# Patient Record
Sex: Female | Born: 1945 | Race: Black or African American | Hispanic: No | Smoking: Former smoker
Health system: Southern US, Community
[De-identification: ages and names within clinical notes are randomized; demographics above are authoritative.]

## PROBLEM LIST (undated history)

## (undated) DIAGNOSIS — Z789 Other specified health status: Secondary | ICD-10-CM

## (undated) DIAGNOSIS — J189 Pneumonia, unspecified organism: Secondary | ICD-10-CM

## (undated) DIAGNOSIS — K922 Gastrointestinal hemorrhage, unspecified: Secondary | ICD-10-CM

## (undated) DIAGNOSIS — E669 Obesity, unspecified: Secondary | ICD-10-CM

## (undated) DIAGNOSIS — J449 Chronic obstructive pulmonary disease, unspecified: Secondary | ICD-10-CM

## (undated) DIAGNOSIS — G4733 Obstructive sleep apnea (adult) (pediatric): Secondary | ICD-10-CM

## (undated) DIAGNOSIS — I4891 Unspecified atrial fibrillation: Secondary | ICD-10-CM

## (undated) DIAGNOSIS — E079 Disorder of thyroid, unspecified: Secondary | ICD-10-CM

## (undated) DIAGNOSIS — I509 Heart failure, unspecified: Secondary | ICD-10-CM

## (undated) DIAGNOSIS — M109 Gout, unspecified: Secondary | ICD-10-CM

## (undated) DIAGNOSIS — N189 Chronic kidney disease, unspecified: Secondary | ICD-10-CM

## (undated) DIAGNOSIS — Z9229 Personal history of other drug therapy: Secondary | ICD-10-CM

## (undated) DIAGNOSIS — R262 Difficulty in walking, not elsewhere classified: Secondary | ICD-10-CM

## (undated) DIAGNOSIS — T8859XA Other complications of anesthesia, initial encounter: Secondary | ICD-10-CM

## (undated) DIAGNOSIS — I272 Pulmonary hypertension, unspecified: Secondary | ICD-10-CM

## (undated) DIAGNOSIS — I429 Cardiomyopathy, unspecified: Secondary | ICD-10-CM

## (undated) DIAGNOSIS — I4819 Other persistent atrial fibrillation: Secondary | ICD-10-CM

## (undated) DIAGNOSIS — H3530 Unspecified macular degeneration: Secondary | ICD-10-CM

## (undated) DIAGNOSIS — I422 Other hypertrophic cardiomyopathy: Secondary | ICD-10-CM

## (undated) DIAGNOSIS — E785 Hyperlipidemia, unspecified: Secondary | ICD-10-CM

## (undated) DIAGNOSIS — M519 Unspecified thoracic, thoracolumbar and lumbosacral intervertebral disc disorder: Secondary | ICD-10-CM

## (undated) DIAGNOSIS — I484 Atypical atrial flutter: Secondary | ICD-10-CM

## (undated) DIAGNOSIS — I5032 Chronic diastolic (congestive) heart failure: Secondary | ICD-10-CM

## (undated) DIAGNOSIS — E039 Hypothyroidism, unspecified: Secondary | ICD-10-CM

## (undated) DIAGNOSIS — D8989 Other specified disorders involving the immune mechanism, not elsewhere classified: Secondary | ICD-10-CM

## (undated) DIAGNOSIS — N183 Chronic kidney disease, stage 3 unspecified: Secondary | ICD-10-CM

## (undated) DIAGNOSIS — I10 Essential (primary) hypertension: Secondary | ICD-10-CM

## (undated) DIAGNOSIS — K219 Gastro-esophageal reflux disease without esophagitis: Secondary | ICD-10-CM

## (undated) DIAGNOSIS — R001 Bradycardia, unspecified: Secondary | ICD-10-CM

## (undated) DIAGNOSIS — R42 Dizziness and giddiness: Secondary | ICD-10-CM

## (undated) DIAGNOSIS — Z531 Procedure and treatment not carried out because of patient's decision for reasons of belief and group pressure: Secondary | ICD-10-CM

## (undated) DIAGNOSIS — IMO0001 Reserved for inherently not codable concepts without codable children: Secondary | ICD-10-CM

## (undated) DIAGNOSIS — I2510 Atherosclerotic heart disease of native coronary artery without angina pectoris: Secondary | ICD-10-CM

## (undated) HISTORY — DX: Other complications of anesthesia, initial encounter: T88.59XA

## (undated) HISTORY — DX: Cardiomyopathy, unspecified: I42.9

## (undated) HISTORY — DX: Heart failure, unspecified: I50.9

## (undated) HISTORY — DX: Obstructive sleep apnea (adult) (pediatric): G47.33

## (undated) HISTORY — DX: Personal history of other drug therapy: Z92.29

## (undated) HISTORY — DX: Pneumonia, unspecified organism: J18.9

## (undated) HISTORY — DX: Obesity, unspecified: E66.9

## (undated) HISTORY — DX: Chronic kidney disease, unspecified: N18.9

## (undated) HISTORY — DX: Disorder of thyroid, unspecified: E07.9

## (undated) HISTORY — DX: Gout, unspecified: M10.9

## (undated) HISTORY — DX: Difficulty in walking, not elsewhere classified: R26.2

## (undated) HISTORY — DX: Gastrointestinal hemorrhage, unspecified: K92.2

## (undated) HISTORY — DX: Unspecified atrial fibrillation: I48.91

## (undated) HISTORY — DX: Pulmonary hypertension, unspecified: I27.20

## (undated) HISTORY — DX: Other specified health status: Z78.9

## (undated) HISTORY — DX: Chronic obstructive pulmonary disease, unspecified: J44.9

## (undated) HISTORY — DX: Hypothyroidism, unspecified: E03.9

## (undated) HISTORY — DX: Unspecified macular degeneration: H35.30

## (undated) HISTORY — PX: CATARACT EXTRACTION: SUR2

## (undated) HISTORY — DX: Essential (primary) hypertension: I10

## (undated) HISTORY — DX: Hyperlipidemia, unspecified: E78.5

## (undated) HISTORY — DX: Gastro-esophageal reflux disease without esophagitis: K21.9

## (undated) HISTORY — DX: Atypical atrial flutter: I48.4

## (undated) HISTORY — DX: Unspecified thoracic, thoracolumbar and lumbosacral intervertebral disc disorder: M51.9

## (undated) HISTORY — DX: Other persistent atrial fibrillation: I48.19

## (undated) HISTORY — PX: CARDIAC CATHETERIZATION: SHX172

---

## 2002-12-07 ENCOUNTER — Other Ambulatory Visit: Admission: RE | Admit: 2002-12-07 | Discharge: 2002-12-07 | Payer: Self-pay | Admitting: Obstetrics and Gynecology

## 2004-05-28 ENCOUNTER — Other Ambulatory Visit: Admission: RE | Admit: 2004-05-28 | Discharge: 2004-05-28 | Payer: Self-pay | Admitting: Obstetrics and Gynecology

## 2008-10-17 ENCOUNTER — Other Ambulatory Visit: Admission: RE | Admit: 2008-10-17 | Discharge: 2008-10-17 | Payer: Self-pay | Admitting: Family Medicine

## 2008-11-02 ENCOUNTER — Encounter: Admission: RE | Admit: 2008-11-02 | Discharge: 2008-11-02 | Payer: Self-pay | Admitting: Family Medicine

## 2009-11-21 ENCOUNTER — Other Ambulatory Visit: Admission: RE | Admit: 2009-11-21 | Discharge: 2009-11-21 | Payer: Self-pay | Admitting: Family Medicine

## 2009-11-23 ENCOUNTER — Encounter: Admission: RE | Admit: 2009-11-23 | Discharge: 2009-11-23 | Payer: Self-pay | Admitting: Family Medicine

## 2009-11-27 ENCOUNTER — Encounter: Admission: RE | Admit: 2009-11-27 | Discharge: 2009-11-27 | Payer: Self-pay | Admitting: Family Medicine

## 2010-04-28 ENCOUNTER — Encounter: Payer: Self-pay | Admitting: Family Medicine

## 2010-11-12 ENCOUNTER — Other Ambulatory Visit: Payer: Self-pay | Admitting: Family Medicine

## 2010-11-12 DIAGNOSIS — N951 Menopausal and female climacteric states: Secondary | ICD-10-CM

## 2010-11-12 DIAGNOSIS — Z1231 Encounter for screening mammogram for malignant neoplasm of breast: Secondary | ICD-10-CM

## 2010-11-26 ENCOUNTER — Ambulatory Visit
Admission: RE | Admit: 2010-11-26 | Discharge: 2010-11-26 | Disposition: A | Discharge status: Home / Self Care | Payer: Medicare Other | Source: Ambulatory Visit | Attending: Family Medicine | Admitting: Family Medicine

## 2010-11-26 DIAGNOSIS — N951 Menopausal and female climacteric states: Secondary | ICD-10-CM

## 2010-11-26 DIAGNOSIS — Z1231 Encounter for screening mammogram for malignant neoplasm of breast: Secondary | ICD-10-CM

## 2010-11-27 ENCOUNTER — Ambulatory Visit: Payer: Self-pay | Admitting: Physical Therapy

## 2010-11-29 ENCOUNTER — Ambulatory Visit: Payer: Medicare Other | Attending: Family Medicine | Admitting: Physical Therapy

## 2010-11-29 DIAGNOSIS — IMO0001 Reserved for inherently not codable concepts without codable children: Secondary | ICD-10-CM | POA: Insufficient documentation

## 2010-11-29 DIAGNOSIS — M2569 Stiffness of other specified joint, not elsewhere classified: Secondary | ICD-10-CM | POA: Insufficient documentation

## 2010-11-29 DIAGNOSIS — M6281 Muscle weakness (generalized): Secondary | ICD-10-CM | POA: Insufficient documentation

## 2010-11-29 DIAGNOSIS — R5381 Other malaise: Secondary | ICD-10-CM | POA: Insufficient documentation

## 2010-12-06 ENCOUNTER — Ambulatory Visit: Payer: Medicare Other | Admitting: Physical Therapy

## 2010-12-11 ENCOUNTER — Ambulatory Visit: Payer: Medicare Other | Attending: Family Medicine

## 2010-12-11 DIAGNOSIS — M2569 Stiffness of other specified joint, not elsewhere classified: Secondary | ICD-10-CM | POA: Insufficient documentation

## 2010-12-11 DIAGNOSIS — M6281 Muscle weakness (generalized): Secondary | ICD-10-CM | POA: Insufficient documentation

## 2010-12-11 DIAGNOSIS — R5381 Other malaise: Secondary | ICD-10-CM | POA: Insufficient documentation

## 2010-12-11 DIAGNOSIS — IMO0001 Reserved for inherently not codable concepts without codable children: Secondary | ICD-10-CM | POA: Insufficient documentation

## 2010-12-16 ENCOUNTER — Ambulatory Visit: Payer: Medicare Other | Admitting: Physical Therapy

## 2010-12-18 ENCOUNTER — Encounter: Payer: Medicare Other | Admitting: Physical Therapy

## 2010-12-19 ENCOUNTER — Other Ambulatory Visit: Payer: Self-pay | Admitting: Gastroenterology

## 2010-12-20 ENCOUNTER — Ambulatory Visit: Payer: Medicare Other | Admitting: Physical Therapy

## 2010-12-23 ENCOUNTER — Ambulatory Visit: Payer: Medicare Other | Admitting: Physical Therapy

## 2010-12-24 ENCOUNTER — Ambulatory Visit (INDEPENDENT_AMBULATORY_CARE_PROVIDER_SITE_OTHER): Payer: Medicare Other | Admitting: Pulmonary Disease

## 2010-12-24 ENCOUNTER — Encounter: Payer: Self-pay | Admitting: Pulmonary Disease

## 2010-12-24 ENCOUNTER — Ambulatory Visit (INDEPENDENT_AMBULATORY_CARE_PROVIDER_SITE_OTHER)
Admission: RE | Admit: 2010-12-24 | Discharge: 2010-12-24 | Disposition: A | Discharge status: Home / Self Care | Payer: Medicare Other | Source: Ambulatory Visit | Attending: Pulmonary Disease | Admitting: Pulmonary Disease

## 2010-12-24 VITALS — BP 106/60 | HR 66 | Temp 97.8°F | Ht 63.0 in | Wt 202.8 lb

## 2010-12-24 DIAGNOSIS — R0600 Dyspnea, unspecified: Secondary | ICD-10-CM | POA: Diagnosis present

## 2010-12-24 DIAGNOSIS — R0609 Other forms of dyspnea: Secondary | ICD-10-CM

## 2010-12-24 DIAGNOSIS — R0989 Other specified symptoms and signs involving the circulatory and respiratory systems: Secondary | ICD-10-CM

## 2010-12-24 NOTE — Progress Notes (Signed)
  Subjective:    Patient ID: Amy [name], female    DOB: 02-15-1946, 65 y.o.   MRN: [id number]  HPI The patient is a 65 year old female who had been asked to see for COPD and dyspnea on exertion.  The patient states that she has had shortness of breath for years, but she has seen a worsening of her exertional tolerance over the last one year.  She was told that she had COPD in 2006 after PFTs were done by her primary physician at the time.  She was started on low-dose Advair, but never used it on a consistent basis.  Most recently she has used it regularly.  She describes a one block dyspnea on exertion at a moderate pace on flat ground, and will get winded bringing groceries in from the car.  She reports shortness of breath with one flight of stairs, as well as light housework.  She denies any cough or mucus production, but does have some lower extremity edema.  She has not had a recent chest x-ray, but tells me that she had a stress test in 2011 that was unremarkable.  She does state that her weight is up 30 pounds over the last one year.   Review of Systems  Constitutional: Negative for fever and unexpected weight change.  HENT: Negative for ear pain, nosebleeds, congestion, sore throat, rhinorrhea, sneezing, trouble swallowing, dental problem, postnasal drip and sinus pressure.   Eyes: Negative for redness and itching.  Respiratory: Positive for shortness of breath. Negative for cough, chest tightness and wheezing.   Cardiovascular: Positive for leg swelling. Negative for palpitations.  Gastrointestinal: Negative for nausea and vomiting.  Genitourinary: Negative for dysuria.  Musculoskeletal: Negative for joint swelling.  Skin: Negative for rash.  Neurological: Negative for headaches.  Hematological: Does not bruise/bleed easily.  Psychiatric/Behavioral: Negative for dysphoric mood. The patient is not nervous/anxious.        Objective:   Physical Exam Constitutional:  Obese  female, no acute distress  HENT:  Nares patent without discharge  Oropharynx without exudate, palate and uvula are elongated  Eyes:  Perrla, eomi, no scleral icterus  Neck:  No JVD, no TMG  Cardiovascular:  Normal rate, regular rhythm, no rubs or gallops.  No murmurs        Intact distal pulses  Pulmonary :  Normal breath sounds, no stridor or respiratory distress   No rales, rhonchi, or wheezing  Abdominal:  Soft, nondistended, bowel sounds present.  No tenderness noted.   Musculoskeletal:  Mild ankle edema noted  Lymph Nodes:  No cervical lymphadenopathy noted  Skin:  No cyanosis noted  Neurologic:  Alert, appropriate, moves all 4 extremities without obvious deficit.         Assessment & Plan:

## 2010-12-24 NOTE — Patient Instructions (Signed)
Will check cxr, and call you with results. Will schedule for breathing tests, and see you back same day to review with you. Continue advair for now at current dose. Work on weight loss and conditioning.

## 2010-12-24 NOTE — Assessment & Plan Note (Signed)
The patient has significant dyspnea on exertion that has been worsening over the last one year.  She apparently has a history of COPD by pulmonary function studies in 2006 by her primary care physician at the time.  However, she is also obese and significantly deconditioned.  She apparently has had a stress test last year that was unremarkable.  At this point, I would like to check a chest x-ray and also schedule her for full PFTs.  Depending upon the results, we may need to intensify her bronchodilator regimen.  I have also encouraged her to work aggressively on weight loss and some type of conditioning program.

## 2010-12-24 NOTE — Progress Notes (Signed)
Addended by: Manson Allan on: 12/24/2010 04:15 PM   Modules accepted: Orders

## 2010-12-25 ENCOUNTER — Ambulatory Visit: Payer: Medicare Other | Admitting: Physical Therapy

## 2010-12-27 ENCOUNTER — Encounter: Payer: Self-pay | Admitting: *Deleted

## 2010-12-30 ENCOUNTER — Ambulatory Visit: Payer: Medicare Other | Admitting: Physical Therapy

## 2011-01-01 ENCOUNTER — Ambulatory Visit: Payer: Medicare Other | Admitting: Physical Therapy

## 2011-01-03 ENCOUNTER — Ambulatory Visit (INDEPENDENT_AMBULATORY_CARE_PROVIDER_SITE_OTHER): Payer: Medicare Other | Admitting: Pulmonary Disease

## 2011-01-03 ENCOUNTER — Encounter: Payer: Self-pay | Admitting: Pulmonary Disease

## 2011-01-03 VITALS — BP 126/78 | HR 78 | Temp 97.8°F | Ht 63.0 in | Wt 202.0 lb

## 2011-01-03 DIAGNOSIS — R0989 Other specified symptoms and signs involving the circulatory and respiratory systems: Secondary | ICD-10-CM

## 2011-01-03 DIAGNOSIS — R0609 Other forms of dyspnea: Secondary | ICD-10-CM

## 2011-01-03 LAB — PULMONARY FUNCTION TEST

## 2011-01-03 NOTE — Assessment & Plan Note (Signed)
The pt's pft's today show minimal airflow obstruction, certainly not near enough to explain her symptoms.  I suspect her obesity and deconditioning are playing major roles, and I am also concerned about diastolic dysfunction with her CM on xray, LE edema, and h/o HTN.  Will recommend to her primary md a cardiac evaluation/echo.  Would keep her on advair for now, but would d/c if she sees improvement in her breathing with weight loss and conditioning.

## 2011-01-03 NOTE — Progress Notes (Signed)
PFT done today. 

## 2011-01-03 NOTE — Progress Notes (Signed)
  Subjective:    Patient ID: Amy [name], female    DOB: [birth year], 65 y.o.   MRN: [id number]  HPI The patient comes in today for followup of her recent PFTs.  She was found to have very little airflow obstruction, no restriction, and a severe reduction in DLCO that corrects to normal with alveolar volume adjustment.  I have reviewed the studies with her in detail, and have answered all of her questions.   Review of Systems  Constitutional: Negative for fever and unexpected weight change.  HENT: Negative for ear pain, nosebleeds, congestion, sore throat, rhinorrhea, sneezing, trouble swallowing, dental problem, postnasal drip and sinus pressure.   Eyes: Negative for redness and itching.  Respiratory: Positive for wheezing. Negative for cough, chest tightness and shortness of breath.   Cardiovascular: Negative for palpitations and leg swelling.  Gastrointestinal: Negative for nausea and vomiting.  Genitourinary: Negative for dysuria.  Musculoskeletal: Negative for joint swelling.  Skin: Negative for rash.  Neurological: Negative for headaches.  Hematological: Bruises/bleeds easily.  Psychiatric/Behavioral: Negative for dysphoric mood. The patient is not nervous/anxious.        Objective:   Physical Exam Obese female in no acute distress Nose without purulence or discharge noted Lower extremities with mild edema, no cyanosis seen Alert and oriented, moves all 4 extremities.       Assessment & Plan:

## 2011-01-03 NOTE — Patient Instructions (Signed)
Your breathing tests show minimal copd today.  That is good news. Continue with advair for now, but work aggressively on weight loss and conditioning.  If you breathing improves, would discontinue the advair Will send an note to Dr. Radene Ou, recommending a cardiac evaluation (echo). followup with me as needed.

## 2011-05-28 DIAGNOSIS — R0602 Shortness of breath: Secondary | ICD-10-CM | POA: Diagnosis not present

## 2011-05-28 DIAGNOSIS — E785 Hyperlipidemia, unspecified: Secondary | ICD-10-CM | POA: Diagnosis not present

## 2011-06-16 DIAGNOSIS — I10 Essential (primary) hypertension: Secondary | ICD-10-CM | POA: Diagnosis not present

## 2011-06-16 DIAGNOSIS — R0602 Shortness of breath: Secondary | ICD-10-CM | POA: Diagnosis not present

## 2011-06-16 DIAGNOSIS — R0609 Other forms of dyspnea: Secondary | ICD-10-CM | POA: Diagnosis not present

## 2011-06-20 DIAGNOSIS — R0602 Shortness of breath: Secondary | ICD-10-CM | POA: Diagnosis not present

## 2011-06-20 DIAGNOSIS — I10 Essential (primary) hypertension: Secondary | ICD-10-CM | POA: Diagnosis not present

## 2011-09-08 DIAGNOSIS — E785 Hyperlipidemia, unspecified: Secondary | ICD-10-CM | POA: Diagnosis not present

## 2011-09-08 DIAGNOSIS — E559 Vitamin D deficiency, unspecified: Secondary | ICD-10-CM | POA: Diagnosis not present

## 2011-09-08 DIAGNOSIS — I10 Essential (primary) hypertension: Secondary | ICD-10-CM | POA: Diagnosis not present

## 2011-09-08 DIAGNOSIS — E039 Hypothyroidism, unspecified: Secondary | ICD-10-CM | POA: Diagnosis not present

## 2011-09-20 DIAGNOSIS — J209 Acute bronchitis, unspecified: Secondary | ICD-10-CM | POA: Diagnosis not present

## 2011-09-29 ENCOUNTER — Encounter (HOSPITAL_COMMUNITY): Payer: Self-pay | Admitting: *Deleted

## 2011-09-29 ENCOUNTER — Emergency Department (HOSPITAL_COMMUNITY)
Admission: EM | Admit: 2011-09-29 | Discharge: 2011-09-29 | Disposition: A | Discharge status: Home / Self Care | Payer: Medicare Other | Attending: Emergency Medicine | Admitting: Emergency Medicine

## 2011-09-29 ENCOUNTER — Emergency Department (HOSPITAL_COMMUNITY): Payer: Medicare Other

## 2011-09-29 DIAGNOSIS — Z79899 Other long term (current) drug therapy: Secondary | ICD-10-CM | POA: Insufficient documentation

## 2011-09-29 DIAGNOSIS — I10 Essential (primary) hypertension: Secondary | ICD-10-CM | POA: Insufficient documentation

## 2011-09-29 DIAGNOSIS — E039 Hypothyroidism, unspecified: Secondary | ICD-10-CM | POA: Diagnosis not present

## 2011-09-29 DIAGNOSIS — J9819 Other pulmonary collapse: Secondary | ICD-10-CM | POA: Diagnosis not present

## 2011-09-29 DIAGNOSIS — R079 Chest pain, unspecified: Secondary | ICD-10-CM | POA: Diagnosis not present

## 2011-09-29 DIAGNOSIS — I4891 Unspecified atrial fibrillation: Secondary | ICD-10-CM | POA: Insufficient documentation

## 2011-09-29 DIAGNOSIS — E669 Obesity, unspecified: Secondary | ICD-10-CM | POA: Diagnosis not present

## 2011-09-29 DIAGNOSIS — Z87891 Personal history of nicotine dependence: Secondary | ICD-10-CM | POA: Insufficient documentation

## 2011-09-29 DIAGNOSIS — E785 Hyperlipidemia, unspecified: Secondary | ICD-10-CM | POA: Insufficient documentation

## 2011-09-29 DIAGNOSIS — I4901 Ventricular fibrillation: Secondary | ICD-10-CM | POA: Diagnosis not present

## 2011-09-29 HISTORY — DX: Dizziness and giddiness: R42

## 2011-09-29 HISTORY — DX: Chronic obstructive pulmonary disease, unspecified: J44.9

## 2011-09-29 LAB — CBC
HCT: 40.9 % (ref 36.0–46.0)
Hemoglobin: 13.6 g/dL (ref 12.0–15.0)
MCH: 29.3 pg (ref 26.0–34.0)
MCHC: 33.3 g/dL (ref 30.0–36.0)
MCV: 88.1 fL (ref 78.0–100.0)
Platelets: 440 10*3/uL — ABNORMAL HIGH (ref 150–400)
RBC: 4.64 MIL/uL (ref 3.87–5.11)
RDW: 16 % — ABNORMAL HIGH (ref 11.5–15.5)
WBC: 11.6 10*3/uL — ABNORMAL HIGH (ref 4.0–10.5)

## 2011-09-29 LAB — POCT I-STAT TROPONIN I

## 2011-09-29 LAB — DIFFERENTIAL
Basophils Relative: 0 % (ref 0–1)
Lymphocytes Relative: 27 % (ref 12–46)
Monocytes Absolute: 0.7 10*3/uL (ref 0.1–1.0)
Monocytes Relative: 6 % (ref 3–12)
Neutro Abs: 7.5 10*3/uL (ref 1.7–7.7)

## 2011-09-29 LAB — TROPONIN I: Troponin I: <0.3 ng/mL (ref <0.30)

## 2011-09-29 LAB — D-DIMER, QUANTITATIVE: D-Dimer, Quant: 0.51 ug/mL-FEU — ABNORMAL HIGH (ref 0.00–0.48)

## 2011-09-29 MED ORDER — ASPIRIN 81 MG PO CHEW
324.0000 mg | CHEWABLE_TABLET | Freq: Once | ORAL | Status: AC
  Administered 2011-09-29: 324 mg via ORAL

## 2011-09-29 MED ORDER — ASPIRIN 81 MG PO CHEW
CHEWABLE_TABLET | ORAL | Status: AC
  Filled 2011-09-29: qty 4

## 2011-09-29 MED ORDER — NITROGLYCERIN 0.4 MG SL SUBL
SUBLINGUAL_TABLET | SUBLINGUAL | Status: AC
  Filled 2011-09-29: qty 25

## 2011-09-29 MED ORDER — METOPROLOL SUCCINATE ER 25 MG PO TB24
25.0000 mg | ORAL_TABLET | Freq: Every day | ORAL | Status: DC
  Administered 2011-09-29: 25 mg via ORAL
  Filled 2011-09-29: qty 1

## 2011-09-29 MED ORDER — SODIUM CHLORIDE 0.9 % IV SOLN
INTRAVENOUS | Status: DC
  Administered 2011-09-29: 20 mL via INTRAVENOUS
  Administered 2011-09-29: 10 mL/h via INTRAVENOUS

## 2011-09-29 MED ORDER — DILTIAZEM HCL 50 MG/10ML IV SOLN
20.0000 mg | Freq: Once | INTRAVENOUS | Status: AC
  Administered 2011-09-29: 20 mg via INTRAVENOUS
  Filled 2011-09-29 (×2): qty 4

## 2011-09-29 NOTE — ED Provider Notes (Signed)
History     CSN: [id number]  Arrival date & time 09/29/11  1521   First MD Initiated Contact with Patient 09/29/11 1533      Chief Complaint  Patient presents with  . Chest Pain    (Consider location/radiation/quality/duration/timing/severity/associated sxs/prior treatment) HPI Develop anterior chest pain, pressure-like onset 20 minutes ago after finishing lunch. Symptoms accompanied a rapid heart beat. No other associated symptoms. No treatment prior to coming here discomfort is nonradiating mild at present. Nothing makes symptoms better or worse. Had similar symptoms approximately 10 years ago Past Medical History  Diagnosis Date  . Hypertension   . Hyperlipidemia   . Hypothyroid    Heart murmur Past Surgical History  Procedure Date  . Cesarean section 1983    Family History  Problem Relation Age of Onset  . Heart disease Sister     Good Pastures Syndrome  . Gout Father   . Lung cancer Father   . Breast cancer Paternal Aunt     History  Substance Use Topics  . Smoking status: Former Smoker -- 0.3 packs/day for 44 years    Types: Cigarettes    Quit date: 04/07/2009  . Smokeless tobacco: Not on file  . Alcohol Use: Not on file    OB History    Grav Para Term Preterm Abortions TAB SAB Ect Mult Living                  Review of Systems  Constitutional: Negative.   HENT: Negative.   Respiratory: Negative.   Cardiovascular: Positive for chest pain and palpitations.  Gastrointestinal: Negative.   Musculoskeletal: Negative.   Skin: Negative.   Neurological: Negative.   Hematological: Negative.   Psychiatric/Behavioral: Negative.     Allergies  Review of patient's allergies indicates no known allergies.  Home Medications   Current Outpatient Rx  Name Route Sig Dispense Refill  . ALBUTEROL SULFATE HFA 108 (90 BASE) MCG/ACT IN AERS Inhalation Inhale 2 puffs into the lungs every 6 (six) hours as needed.      . ATORVASTATIN CALCIUM 40 MG PO TABS Oral  Take 40 mg by mouth daily.      Marland Kitchen FLUTICASONE-SALMETEROL 100-50 MCG/DOSE IN AEPB Inhalation Inhale 1 puff into the lungs 2 (two) times daily.      Marland Kitchen LEVOTHYROXINE SODIUM 88 MCG PO TABS Oral Take 88 mcg by mouth daily.      Marland Kitchen LISINOPRIL-HYDROCHLOROTHIAZIDE 20-12.5 MG PO TABS Oral Take 1 tablet by mouth daily.      Marland Kitchen VITAMIN D (ERGOCALCIFEROL) 50000 UNITS PO CAPS Oral Take 50,000 Units by mouth every 7 (seven) days.       BP 142/76  Pulse 137  Resp 18  SpO2 97%  Physical Exam  Nursing note and vitals reviewed. Constitutional: She appears well-developed and well-nourished.  HENT:  Head: Normocephalic and atraumatic.  Eyes: Conjunctivae are normal. Pupils are equal, round, and reactive to light.  Neck: Neck supple. No tracheal deviation present. No thyromegaly present.  Cardiovascular:  No murmur heard.      Tachycardic irregularly irregular  Pulmonary/Chest: Effort normal and breath sounds normal.  Abdominal: Soft. Bowel sounds are normal. She exhibits no distension. There is no tenderness.       Obese  Musculoskeletal: Normal range of motion. She exhibits no edema and no tenderness.  Neurological: She is alert. Coordination normal.  Skin: Skin is warm and dry. No rash noted.  Psychiatric: She has a normal mood and affect.    ED Course  Procedures (including critical care ti Date: 09/29/2011  Rate: 140  Rhythm: atrial fibrillation  QRS Axis: normal  Intervals: normal  ST/T Wave abnormalities: Inferolateral ischemia, nonspecific ST-T wave changes interpreted by me  Conduction Disutrbances:none  Narrative Interpretation:   Old EKG Reviewed: none available Spoke with Dr. Radford Pax who suggests patient had recent negative stress Myoview. Suggests presets markers Toprol-XL patient remains asymptomatic discharge with office followup tomorrow at 5:15 PM patient is asymptomatic, pain free  Labs Reviewed  CBC  DIFFERENTIAL   No results found.  probability    Results for orders  placed during the hospital encounter of 09/29/11  CBC      Component Value Range   WBC 11.6 (*) 4.0 - 10.5 K/uL   RBC 4.64  3.87 - 5.11 MIL/uL   Hemoglobin 13.6  12.0 - 15.0 g/dL   HCT 40.9  36.0 - 46.0 %   MCV 88.1  78.0 - 100.0 fL   MCH 29.3  26.0 - 34.0 pg   MCHC 33.3  30.0 - 36.0 g/dL   RDW 16.0 (*) 11.5 - 15.5 %   Platelets 440 (*) 150 - 400 K/uL  DIFFERENTIAL      Component Value Range   Neutrophils Relative 65  43 - 77 %   Neutro Abs 7.5  1.7 - 7.7 K/uL   Lymphocytes Relative 27  12 - 46 %   Lymphs Abs 3.1  0.7 - 4.0 K/uL   Monocytes Relative 6  3 - 12 %   Monocytes Absolute 0.7  0.1 - 1.0 K/uL   Eosinophils Relative 2  0 - 5 %   Eosinophils Absolute 0.2  0.0 - 0.7 K/uL   Basophils Relative 0  0 - 1 %   Basophils Absolute 0.0  0.0 - 0.1 K/uL  POCT I-STAT, CHEM 8      Component Value Range   Sodium 137  135 - 145 mEq/L   Potassium 7.6 (*) 3.5 - 5.1 mEq/L   Chloride 107  96 - 112 mEq/L   BUN 21  6 - 23 mg/dL   Creatinine, Ser 1.00  0.50 - 1.10 mg/dL   Glucose, Bld 118 (*) 70 - 99 mg/dL   Calcium, Ion 1.07 (*) 1.12 - 1.32 mmol/L   TCO2 24  0 - 100 mmol/L   Hemoglobin 16.7 (*) 12.0 - 15.0 g/dL   HCT 49.0 (*) 36.0 - 46.0 %  POCT I-STAT TROPONIN I      Component Value Range   Troponin i, poc 0.00  0.00 - 0.08 ng/mL   Comment 3           POTASSIUM      Component Value Range   Potassium 3.9  3.5 - 5.1 mEq/L  POCT I-STAT TROPONIN I      Component Value Range   Troponin i, poc 0.01  0.00 - 0.08 ng/mL   Comment 3           TROPONIN I      Component Value Range   Troponin I <0.30  <0.30 ng/mL  D-DIMER, QUANTITATIVE      Component Value Range   D-Dimer, Quant 0.51 (*) 0.00 - 0.48 ug/mL-FEU  POCT I-STAT TROPONIN I      Component Value Range   Troponin i, poc 0.03  0.00 - 0.08 ng/mL   Comment 3            Dg Chest Port 1 View  09/29/2011  *RADIOLOGY REPORT*  Clinical Data: Chest  pain  PORTABLE CHEST - 1 VIEW  Comparison: 12/24/2010  Findings: Very low lung volumes  resulting in vascular prominence and basilar atelectasis.  No definite CHF, collapse, consolidation, pneumonia, large effusion or pneumothorax.  Monitor leads overlie the chest.  Trachea midline.  IMPRESSION: Low volume exam with vascular prominence and basilar atelectasis  Original Report Authenticated By: Jerilynn Mages. Daryll Brod, M.D.       No diagnosis found.    MDM  extremeley low pretest  For pe  , ddimer requested by dr turner pt without sob therefroe do not feel ct angio warranted after observation for several hours in the ed an pt asymptomatic. Plan aspirin 325 mg daily An appointment has been scheduled for 9 AM tomorrow at Dr. Theodosia Blender office Diagnosis atrial fibrillation with rapid ventricular rate-resolved    Orlie Dakin, MD 09/29/11 2129

## 2011-09-29 NOTE — ED Notes (Addendum)
Took pt. To bathroom on the [place]

## 2011-09-29 NOTE — ED Notes (Signed)
Family reports chest pressure that started 13mn while at restaurant. Pt HR 150 in triage. Pt anxious and tearful. C/o heaviness in chest.

## 2011-09-29 NOTE — Discharge Instructions (Signed)
Atrial Fibrillation Take coated aspirin 325 mg daily starting tomorrow. An appointment has been scheduled for you to see Dr. Radford Pax tomorrow at 9 AM. Return if her condition worsens for any reason Atrial fibrillation is an abnormal heartbeat (rhythm). It can cause your heart rate to be faster or slower than normal, and can cause clots of blood to form in your heart. These clots can cause other health problems. Atrial fibrillation may be caused by a heart attack, lung problem, or certain medicine. Sometimes the cause of atrial fibrillation is not found. HOME CARE  Take blood thinning medicine (anticoagulants) as told by your doctor. Your doctor will need to draw your blood to check lab values if you take blood thinners.   If you had a cardioversion, limit your activity as told by your doctor.   Learn how to check your heartbeat (pulse) for an abnormal or irregular beat. Your doctor can show you how.   Ask your doctor if it is okay to exercise.   Only take medicine as told by your doctor.  GET HELP RIGHT AWAY IF:   You have trouble breathing or feel dizzy.   You have puffy (swollen) feet or ankles.   You have blood in your pee (urine) or poop (bowel movement).   You feel your heart "skipping" beats.   You feel your heart "racing" or beating fast.   You have weakness in your arms or legs.   You have trouble talking, seeing, or thinking.   You have chest pain or pain in your arm or jaw.  MAKE SURE YOU:   Understand these instructions.   Will watch your condition.   Will get help right away if you are not doing well or get worse.  Document Released: 01/01/2008 Document Revised: 03/13/2011 Document Reviewed: 07/12/2009 [facility] Patient Information 2012 [place].

## 2011-09-30 DIAGNOSIS — I4891 Unspecified atrial fibrillation: Secondary | ICD-10-CM | POA: Diagnosis not present

## 2011-09-30 DIAGNOSIS — R079 Chest pain, unspecified: Secondary | ICD-10-CM | POA: Diagnosis not present

## 2011-09-30 DIAGNOSIS — I10 Essential (primary) hypertension: Secondary | ICD-10-CM | POA: Diagnosis not present

## 2011-09-30 LAB — POCT I-STAT, CHEM 8
BUN: 21 mg/dL (ref 6–23)
Calcium, Ion: 1.07 mmol/L — ABNORMAL LOW (ref 1.12–1.32)
Creatinine, Ser: 1 mg/dL (ref 0.50–1.10)
Glucose, Bld: 118 mg/dL — ABNORMAL HIGH (ref 70–99)
Hemoglobin: 16.7 g/dL — ABNORMAL HIGH (ref 12.0–15.0)
TCO2: 24 mmol/L (ref 0–100)

## 2011-10-01 ENCOUNTER — Other Ambulatory Visit: Payer: Self-pay | Admitting: Cardiology

## 2011-10-02 ENCOUNTER — Other Ambulatory Visit: Payer: Self-pay | Admitting: Cardiology

## 2011-10-02 ENCOUNTER — Encounter: Payer: Self-pay | Admitting: Cardiology

## 2011-10-02 NOTE — H&P (Signed)
Office Visit     Patient: Amy [name] Provider: Fransico Him, MD  DOB: 04/15/1945 Age: 66 Y Sex: Female Date: [name]  Phone: [phone]   Address: [address] [place], [place], [place]  Pcp: Amy [name]       Subjective:     CC:    1. TT/PER DR Amy [name].        HPI:  General:  The patient presents today for evaluation of palpitations and chest pain. She recently saw me for SOB and [name] workup including echo and nuclear stress test was normal. Yesterday while eating lunch she suddenly felt palpitations and then chest pressure. She left the restaurant and started getting [name] pain in her right jaw. Her fiance took her to [facility] ER. In ER she was noted to be in afib with RVR. She was given IV Cardizem 3m with decrease in heart rate to 60-80's. She says the week before the was having heaviness in her chest and could not get comfortable while in bed and sat up and took TUMS which resolve the sensation. She has also noticed some burning in her upper chest [name] few days ago that she thought was indigestion. She still has occasional SOB..        ROS:  See HPI, [name] twelve system review was perfomed at today's visit. For pertinent positives and negatives see HPI.       Medical History: Hypertension, Hyperlipidemia, COPD-mild, Hypothyroidism, lumbar disc disease (spine Amy) Amy [name], Macular degenerationRetinal issues sees Optho,Amy [name] last seen 01/2010, chronic c/o DOE with essentially negative w/u and consideration given to GERD/very mild COPD/anxiety and deconditioning, Cards eval 2013 Echocardiogram nl LVF, mild LVH, mild LAE, mild MR/PR, mod MAC, grade II diastolic dysfunction.Stress test negative ischemia., PAF.        Family History:        Social History:  General:  History of smoking  cigarettes: Former smoker Quit in year year Smoking: Just started Chantix 11/06/10.  Alcohol: yes, wine in the evenings;2-3 days per week.  Caffeine: yes, 1 cup of coffee per day.   no Recreational drug use.  no Diet.  Occupation: unemployed, tPharmacist, hospital retired.  Marital Status: single, Divorced. Has new boyfriend 02/2011.  Children: girls, 1.        Medications: Albuterol Sulfate HFA 108 (90 Base) MCG/ACT Aerosol Solution 1 -2 puffs every 4 -6 hrs prn, Advair Diskus 100-50 MCG/DOSE Miscellaneous 1 every 12 hrs using this prn, Vitamin D 1000 UNIT Capsule as directed , Lisinopril-Hydrochlorothiazide 20-12.5 MG Tablet 1 tablet Once [name] day, Aspirin 325 MG Tablet Chewable 1 tablet Once [name] day, Lipitor 40 MG Tablet 1 tablet once [name] day, Krill Oil 1000 MG Capsule as directed pt is currently out, Levothyroxine Sodium 88 MCG Tablet 1 tablet every morning, Medication List reviewed and reconciled with the patient       Allergies: N.K.D.[name].      Objective:     Vitals: Wt 205.2, Wt change 3 lb, Ht 63.50, BMI 35.78, Pulse sitting 64, BP sitting 130/78.       Examination:  Cardiology, General:  GENERAL APPEARANCE: pleasant, NAD.  HEENT: unremarkable.  CAROTID UPSTROKE: normal, no bruit.  JVD: flat.  HEART SOUNDS: regular, normal S1, S2, no S3 or S4.  MURMUR: absent.  LUNGS: no rales or wheezes.  ABDOMEN: soft, non tender, positive bowel sounds, no masses felt.  EXTREMITIES: no leg edema.  PERIPHERAL PULSES: 2 plus bilateral.        Assessment:  Assessment:  1. AF (paroxysmal atrial fibrillation) - 427.31 (Primary)  2. Hypertension, essential - 401.1  3. Chest pain - 786.50    Plan:     1. AF (paroxysmal atrial fibrillation)  Diagnostic Imaging:[facility] ACT Ex (Ordered for 10/07/2011)  I will get [name] lifewatch monitor to assess for silent PAF. Her CHADS score is 1 so I have asked her to continue on ASA daily. Since she is bradycardic today I will not add [name] daily beta blocker.       2. Hypertension, essential  Diagnostic Imaging:EKG sinus bradycardia with LVH and repol abnormality, Corson,Amy [name] 09:24:14 AM > Amy [name] 09:35:03 AM >        3. Chest pain  LAB: Basic Metabolic Normal    GLUCOSE 72 70-99 - mg/dL    BUN 14 6-26 - mg/dL    CREATININE 1.04 0.60-1.30 - mg/dl    eGFR (NON-AFRICAN AMERICAN) 53 >60 - calc L   eGFR (AFRICAN AMERICAN) 64 >60 - calc    SODIUM 139 136-145 - mmol/L    POTASSIUM 4.5 3.5-5.5 - mmol/L    CHLORIDE 105 98-107 - mmol/L    C02 26 22-32 - mg/dL    ANION GAP 11.8 6.0-20.0 - mmol/L    CALCIUM 9.5 8.6-10.3 - mg/dL     Amy [name] 03:12:43 PM > Amy [name] 10/01/2011 01:02:05 PM > Pt. is aware    LAB: CBC with Diff Normal    WBC 11.1 4.0-11.0 - K/ul H   RBC 4.29 4.20-5.40 - [name]/uL    HGB 12.2 12.0-16.0 - g/dL    HCT 39.5 37.0-47.0 - %    MCH 28.5 27.0-33.0 - pg    MPV 7.8 7.5-10.7 - fL    MCV 92.1 81.0-99.0 - fL    MCHC 31.0 32.0-36.0 - g/dL L   RDW 15.2 11.5-15.5 - %    NRBC# 0.02 -    PLT 344 150-400 - K/uL    NEUT % 67.7 43.3-71.9 - %    NRBC% 0.20 - %    LYMPH% 23.3 16.8-43.5 - %    MONO % 6.8 4.6-12.4 - %    EOS % 1.6 0.0-7.8 - %    BASO % 0.6 0.0-1.0 - %    NEUT # 7.5 1.9-7.2 - K/uL H   LYMPH# 2.60 1.10-2.70 - K/uL    MONO # 0.8 0.3-0.8 - K/uL    EOS # 0.2 0.0-0.6 - K/uL    BASO # 0.1 0.0-0.1 - K/uL     Amy [name] 03:12:34 PM > Amy [name] 10/01/2011 01:02:57 PM > Pt. is aware    LAB: PT (Prothrombin Time) (130865) Normal    Prothrombin Time 10.4 9.1-12.0 - SEC    INR 1.0 0.8-1.2 -     Amy [name] 10/01/2011 11:06:25 AM > Called PT. and LVM to call back. Amy [name] 10/01/2011 11:09:37 AM > Called Pts. Cell and she is aware.    Diagnostic Imaging:Cardiac Cath (Ordered for 10/07/2011)  Since she continues to have episodes of chest pain and SOB I have recommended proceeding with cardiac cath to evaluate for underlying CAD.        Immunizations:        Labs:        Procedure Codes: 93000 EKG I AND R, 80048 ECL BMP, 85025 ECL CBC PLATELET DIFF, 78469 BLOOD COLLECTION ROUTINE VENIPUNCTURE       Preventive:         Provider: Fransico Him, MD  Patient: Amy [name], Amy [name]  [name] DOB: December 22, 1945 Date: [name]

## 2011-10-07 ENCOUNTER — Encounter (HOSPITAL_BASED_OUTPATIENT_CLINIC_OR_DEPARTMENT_OTHER)
Admission: RE | Disposition: A | Discharge status: Home / Self Care | Payer: Self-pay | Source: Ambulatory Visit | Attending: Cardiology

## 2011-10-07 ENCOUNTER — Inpatient Hospital Stay (HOSPITAL_BASED_OUTPATIENT_CLINIC_OR_DEPARTMENT_OTHER)
Admission: RE | Admit: 2011-10-07 | Discharge: 2011-10-07 | Disposition: A | Discharge status: Home / Self Care | Payer: Medicare Other | Source: Ambulatory Visit | Attending: Cardiology | Admitting: Cardiology

## 2011-10-07 DIAGNOSIS — R079 Chest pain, unspecified: Secondary | ICD-10-CM | POA: Diagnosis not present

## 2011-10-07 DIAGNOSIS — I2510 Atherosclerotic heart disease of native coronary artery without angina pectoris: Secondary | ICD-10-CM | POA: Diagnosis not present

## 2011-10-07 SURGERY — JV LEFT HEART CATHETERIZATION WITH CORONARY ANGIOGRAM
Anesthesia: Moderate Sedation

## 2011-10-07 MED ORDER — SODIUM CHLORIDE 0.9 % IJ SOLN: 3.0000 mL | Freq: Two times a day (BID) | INTRAMUSCULAR | Status: DC

## 2011-10-07 MED ORDER — ONDANSETRON HCL 4 MG/2ML IJ SOLN: 4.0000 mg | Freq: Four times a day (QID) | INTRAMUSCULAR | Status: DC | PRN

## 2011-10-07 MED ORDER — SODIUM CHLORIDE 0.9 % IV SOLN: 250.0000 mL | INTRAVENOUS | Status: DC | PRN

## 2011-10-07 MED ORDER — FUROSEMIDE 20 MG PO TABS: 20.0000 mg | ORAL_TABLET | Freq: Every day | ORAL | Status: DC

## 2011-10-07 MED ORDER — ASPIRIN 81 MG PO CHEW
324.0000 mg | CHEWABLE_TABLET | ORAL | Status: AC
  Administered 2011-10-07: 324 mg via ORAL

## 2011-10-07 MED ORDER — SODIUM CHLORIDE 0.9 % IV SOLN
INTRAVENOUS | Status: DC
  Administered 2011-10-07: 09:00:00 via INTRAVENOUS

## 2011-10-07 MED ORDER — DIAZEPAM 5 MG PO TABS
5.0000 mg | ORAL_TABLET | ORAL | Status: AC
  Administered 2011-10-07: 5 mg via ORAL

## 2011-10-07 MED ORDER — NITROGLYCERIN 0.4 MG SL SUBL: 0.4000 mg | SUBLINGUAL_TABLET | SUBLINGUAL | Status: DC | PRN

## 2011-10-07 MED ORDER — ACETAMINOPHEN 325 MG PO TABS: 650.0000 mg | ORAL_TABLET | ORAL | Status: DC | PRN

## 2011-10-07 MED ORDER — METOPROLOL SUCCINATE ER 25 MG PO TB24: 25.0000 mg | ORAL_TABLET | Freq: Every day | ORAL | Status: DC

## 2011-10-07 MED ORDER — LISINOPRIL 20 MG PO TABS: 20.0000 mg | ORAL_TABLET | Freq: Every day | ORAL | Status: DC

## 2011-10-07 MED ORDER — SODIUM CHLORIDE 0.9 % IJ SOLN: 3.0000 mL | INTRAMUSCULAR | Status: DC | PRN

## 2011-10-07 NOTE — Progress Notes (Signed)
Bedrest begins @ 1030, tegaderm dressing applied by Moishe Spice, activity restrictions completed with patient.

## 2011-10-07 NOTE — CV Procedure (Signed)
PROCEDURE:  Left heart catheterization with selective coronary angiography, left ventriculogram.  INDICATIONS:    The risks, benefits, and details of the procedure were explained to the patient.  The patient verbalized understanding and wanted to proceed.  Informed written consent was obtained.  PROCEDURE TECHNIQUE:  After Xylocaine anesthesia a 34F sheath was placed in the right femoral artery with a single anterior needle wall stick.   Left coronary angiography was done using a Judkins L4 guide catheter.  Right coronary angiography was done using a Judkins R4 guide catheter.  Left ventriculography was done using a pigtail catheter.    CONTRAST:  Total of 60 cc.  COMPLICATIONS:  None.    HEMODYNAMICS:  Aortic pressure was 118/17mHg; LV pressure was 121/224mg; LVEDP 219m.  There was no gradient between the left ventricle and aorta.    ANGIOGRAPHIC DATA:   The left main coronary artery is widely patent.  The left anterior descending artery is widely patent with a 20% ostial narrowing.  It gives rise to a first diagonal which is small in size with an 80% stenosis and then an aneurysmal segment.  It gives rise then to a second diagonal which is small but patent.  The ongoing LAD traverses the apex and is widely patent.    The left circumflex artery is dominant and  widely patent.  It gives rise to a large OM1 which is widely patent and bifurcates into 2 daughter branches which are widely patent. The inferior branch bifucrates again into 2 vessels which are patent. The ongoing left circumflex is widely patent and gives rise distally to an OM2 and PDA both of which are widely patent.    The right coronary artery is nondominant and patent.  LEFT VENTRICULOGRAM:  Left ventricular angiogram was done in the 30 RAO projection and revealed normal left ventricular wall motion and systolic function with an estimated ejection fraction of 60% with a spade shape.  LVEDP was 21  mmHg.  IMPRESSIONS:  1. Normal left main coronary artery. 2. Normal left anterior descending artery with an 80% stenosis in the proximal portion of a small D1 too small for PCI 3. Normal left circumflex artery and its branches - left dominant 4. Normal right coronary artery. 5. Normal left ventricular systolic function.  LVEDP 21 mmHg.  Ejection fraction 60%. 6.  Elevated LVEDP c/w diastolic dysfunction  RECOMMENDATION:   1.  D/C home once IVF and bedrest complete. 2.  Medical management of diagonal stenosis. 3.  Add Toprol XL 64m72mily for diastolic dysfunction 4.  Discontinue HCT portion of Lisinopril and change to Lasix 20mg82mly 5.  Followup in office in 1 week 6.  Continue ASA and statin

## 2011-10-07 NOTE — H&P (View-Only) (Signed)
Office Visit     Patient: Amy Amy [name] Provider: Fransico Him, MD  DOB: [birth year] Age: 66 Y Sex: Female Date: 09/30/2011  Phone: [phone]   Address: [address] [place], [place], [place]  Pcp: Aretta Nip       Subjective:     CC:    1. TT/PER DR Radford Pax.        HPI:  General:  The patient presents today for evaluation of palpitations and chest pain. She recently saw me for SOB and Amy [name] workup including echo and nuclear stress test was normal. Yesterday while eating lunch she suddenly felt palpitations and then chest pressure. She left the restaurant and started getting Amy [name] pain in her right jaw. Her fiance took her to [facility] ER. In ER she was noted to be in afib with RVR. She was given IV Cardizem 25m with decrease in heart rate to 60-80's. She says the week before the was having heaviness in her chest and could not get comfortable while in bed and sat up and took TUMS which resolve the sensation. She has also noticed some burning in her upper chest Amy [name] few days ago that she thought was indigestion. She still has occasional SOB..        ROS:  See HPI, Amy [name] twelve system review was perfomed at today's visit. For pertinent positives and negatives see HPI.       Medical History: Hypertension, Hyperlipidemia, COPD-mild, Hypothyroidism, lumbar disc disease (spine center) Dr.Tooke, Macular degenerationRetinal issues sees Optho,Dr.Peter Dunn last seen 01/2010, chronic c/o DOE with essentially negative w/u and consideration given to GERD/very mild COPD/anxiety and deconditioning, Cards eval 2013 Echocardiogram nl LVF, mild LVH, mild LAE, mild MR/PR, mod MAC, grade II diastolic dysfunction.Stress test negative ischemia., PAF.        Family History:        Social History:  General:  History of smoking  cigarettes: Former smoker Quit in year year Smoking: Just started Chantix 11/06/10.  Alcohol: yes, wine in the evenings;2-3 days per week.  Caffeine: yes, 1 cup of coffee per day.   no Recreational drug use.  no Diet.  Occupation: unemployed, tPharmacist, hospital retired.  Marital Status: single, Divorced. Has new boyfriend 02/2011.  Children: girls, 1.        Medications: Albuterol Sulfate HFA 108 (90 Base) MCG/ACT Aerosol Solution 1 -2 puffs every 4 -6 hrs prn, Advair Diskus 100-50 MCG/DOSE Miscellaneous 1 every 12 hrs using this prn, Vitamin D 1000 UNIT Capsule as directed , Lisinopril-Hydrochlorothiazide 20-12.5 MG Tablet 1 tablet Once Amy [name] day, Aspirin 325 MG Tablet Chewable 1 tablet Once Amy [name] day, Lipitor 40 MG Tablet 1 tablet once Amy [name] day, Krill Oil 1000 MG Capsule as directed pt is currently out, Levothyroxine Sodium 88 MCG Tablet 1 tablet every morning, Medication List reviewed and reconciled with the patient       Allergies: N.K.D.Amy [name].      Objective:     Vitals: Wt 205.2, Wt change 3 lb, Ht 63.50, BMI 35.78, Pulse sitting 64, BP sitting 130/78.       Examination:  Cardiology, General:  GENERAL APPEARANCE: pleasant, NAD.  HEENT: unremarkable.  CAROTID UPSTROKE: normal, no bruit.  JVD: flat.  HEART SOUNDS: regular, normal S1, S2, no S3 or S4.  MURMUR: absent.  LUNGS: no rales or wheezes.  ABDOMEN: soft, non tender, positive bowel sounds, no masses felt.  EXTREMITIES: no leg edema.  PERIPHERAL PULSES: 2 plus bilateral.        Assessment:  Assessment:  1. AF (paroxysmal atrial fibrillation) - 427.31 (Primary)  2. Hypertension, essential - 401.1  3. Chest pain - 786.50    Plan:     1. AF (paroxysmal atrial fibrillation)  Diagnostic Imaging:[facility] ACT Ex (Ordered for 10/07/2011)  I will get Amy [name] lifewatch monitor to assess for silent PAF. Her CHADS score is 1 so I have asked her to continue on ASA daily. Since she is bradycardic today I will not add Amy [name] daily beta blocker.       2. Hypertension, essential  Diagnostic Imaging:EKG sinus bradycardia with LVH and repol abnormality, Corson,Danielle 09/30/2011 09:24:14 AM > [name] M 09/30/2011 09:35:03 AM >        3. Chest pain  LAB: Basic Metabolic Normal    GLUCOSE 72 70-99 - mg/dL    BUN 14 6-26 - mg/dL    CREATININE 1.04 0.60-1.30 - mg/dl    eGFR (NON-AFRICAN AMERICAN) 53 >60 - calc L   eGFR (AFRICAN AMERICAN) 64 >60 - calc    SODIUM 139 136-145 - mmol/L    POTASSIUM 4.5 3.5-5.5 - mmol/L    CHLORIDE 105 98-107 - mmol/L    C02 26 22-32 - mg/dL    ANION GAP 11.8 6.0-20.0 - mmol/L    CALCIUM 9.5 8.6-10.3 - mg/dL     [name] M 09/30/2011 03:12:43 PM > Corson,Danielle 10/01/2011 01:02:05 PM > Pt. is aware    LAB: CBC with Diff Normal    WBC 11.1 4.0-11.0 - K/ul H   RBC 4.29 4.20-5.40 - M/uL    HGB 12.2 12.0-16.0 - g/dL    HCT 39.5 37.0-47.0 - %    MCH 28.5 27.0-33.0 - pg    MPV 7.8 7.5-10.7 - fL    MCV 92.1 81.0-99.0 - fL    MCHC 31.0 32.0-36.0 - g/dL L   RDW 15.2 11.5-15.5 - %    NRBC# 0.02 -    PLT 344 150-400 - K/uL    NEUT % 67.7 43.3-71.9 - %    NRBC% 0.20 - %    LYMPH% 23.3 16.8-43.5 - %    MONO % 6.8 4.6-12.4 - %    EOS % 1.6 0.0-7.8 - %    BASO % 0.6 0.0-1.0 - %    NEUT # 7.5 1.9-7.2 - K/uL H   LYMPH# 2.60 1.10-2.70 - K/uL    MONO # 0.8 0.3-0.8 - K/uL    EOS # 0.2 0.0-0.6 - K/uL    BASO # 0.1 0.0-0.1 - K/uL     Khari Lett M 09/30/2011 03:12:34 PM > Corson,Danielle 10/01/2011 01:02:57 PM > Pt. is aware    LAB: PT (Prothrombin Time) (275170) Normal    Prothrombin Time 10.4 9.1-12.0 - SEC    INR 1.0 0.8-1.2 -     Corson,Danielle 10/01/2011 11:06:25 AM > Called PT. and LVM to call back. Corson,Danielle 10/01/2011 11:09:37 AM > Called Pts. Cell and she is aware.    Diagnostic Imaging:Cardiac Cath (Ordered for 10/07/2011)  Since she continues to have episodes of chest pain and SOB I have recommended proceeding with cardiac cath to evaluate for underlying CAD.        Immunizations:        Labs:        Procedure Codes: 93000 EKG I AND R, 80048 ECL BMP, 85025 ECL CBC PLATELET DIFF, 01749 BLOOD COLLECTION ROUTINE VENIPUNCTURE       Preventive:         Provider: Fransico Him, MD  Patient: Amy Amy [name], Amy Amy [name]  Amy [name] DOB: 1945-08-20 Date: 09/30/2011

## 2011-10-07 NOTE — Interval H&P Note (Signed)
History and Physical Interval Note:  10/07/2011 9:41 AM  Amy [name]  has presented today for surgery, with the diagnosis of chest pain  The various methods of treatment have been discussed with the patient and family. After consideration of risks, benefits and other options for treatment, the patient has consented to  Procedure(s) (LRB): JV LEFT HEART CATHETERIZATION WITH CORONARY ANGIOGRAM (N/A) as a surgical intervention .  The patient's history has been reviewed, patient examined, no change in status, stable for surgery.  I have reviewed the patients' chart and labs.  Questions were answered to the patient's satisfaction.     [name] R

## 2011-10-10 DIAGNOSIS — R079 Chest pain, unspecified: Secondary | ICD-10-CM | POA: Diagnosis not present

## 2011-10-10 DIAGNOSIS — I4891 Unspecified atrial fibrillation: Secondary | ICD-10-CM | POA: Diagnosis not present

## 2011-10-10 DIAGNOSIS — I10 Essential (primary) hypertension: Secondary | ICD-10-CM | POA: Diagnosis not present

## 2011-10-20 DIAGNOSIS — Z79899 Other long term (current) drug therapy: Secondary | ICD-10-CM | POA: Diagnosis not present

## 2011-10-20 DIAGNOSIS — Z48812 Encounter for surgical aftercare following surgery on the circulatory system: Secondary | ICD-10-CM | POA: Diagnosis not present

## 2011-10-20 DIAGNOSIS — I10 Essential (primary) hypertension: Secondary | ICD-10-CM | POA: Diagnosis not present

## 2011-10-20 DIAGNOSIS — I4891 Unspecified atrial fibrillation: Secondary | ICD-10-CM | POA: Diagnosis not present

## 2011-11-04 DIAGNOSIS — I10 Essential (primary) hypertension: Secondary | ICD-10-CM | POA: Diagnosis not present

## 2011-11-04 DIAGNOSIS — E669 Obesity, unspecified: Secondary | ICD-10-CM | POA: Diagnosis not present

## 2011-11-04 DIAGNOSIS — E785 Hyperlipidemia, unspecified: Secondary | ICD-10-CM | POA: Diagnosis not present

## 2011-11-19 DIAGNOSIS — E039 Hypothyroidism, unspecified: Secondary | ICD-10-CM | POA: Diagnosis not present

## 2011-11-19 DIAGNOSIS — I10 Essential (primary) hypertension: Secondary | ICD-10-CM | POA: Diagnosis not present

## 2011-11-19 DIAGNOSIS — E7800 Pure hypercholesterolemia, unspecified: Secondary | ICD-10-CM | POA: Diagnosis not present

## 2011-11-19 DIAGNOSIS — I4891 Unspecified atrial fibrillation: Secondary | ICD-10-CM | POA: Diagnosis not present

## 2011-11-19 DIAGNOSIS — Z0000 Encounter for general adult medical examination without abnormal findings: Secondary | ICD-10-CM | POA: Diagnosis not present

## 2011-12-01 ENCOUNTER — Other Ambulatory Visit: Payer: Self-pay | Admitting: Family Medicine

## 2011-12-01 DIAGNOSIS — Z1231 Encounter for screening mammogram for malignant neoplasm of breast: Secondary | ICD-10-CM

## 2011-12-03 DIAGNOSIS — E669 Obesity, unspecified: Secondary | ICD-10-CM | POA: Diagnosis not present

## 2011-12-03 DIAGNOSIS — I10 Essential (primary) hypertension: Secondary | ICD-10-CM | POA: Diagnosis not present

## 2011-12-03 DIAGNOSIS — E785 Hyperlipidemia, unspecified: Secondary | ICD-10-CM | POA: Diagnosis not present

## 2011-12-30 ENCOUNTER — Ambulatory Visit: Payer: Medicare Other

## 2012-01-06 DIAGNOSIS — E669 Obesity, unspecified: Secondary | ICD-10-CM | POA: Diagnosis not present

## 2012-01-06 DIAGNOSIS — E785 Hyperlipidemia, unspecified: Secondary | ICD-10-CM | POA: Diagnosis not present

## 2012-01-06 DIAGNOSIS — E039 Hypothyroidism, unspecified: Secondary | ICD-10-CM | POA: Diagnosis not present

## 2012-01-06 DIAGNOSIS — I10 Essential (primary) hypertension: Secondary | ICD-10-CM | POA: Diagnosis not present

## 2012-02-06 DIAGNOSIS — I214 Non-ST elevation (NSTEMI) myocardial infarction: Secondary | ICD-10-CM

## 2012-02-06 DIAGNOSIS — I4891 Unspecified atrial fibrillation: Secondary | ICD-10-CM | POA: Diagnosis not present

## 2012-02-06 DIAGNOSIS — I10 Essential (primary) hypertension: Secondary | ICD-10-CM | POA: Diagnosis not present

## 2012-02-06 DIAGNOSIS — I519 Heart disease, unspecified: Secondary | ICD-10-CM | POA: Diagnosis not present

## 2012-02-06 DIAGNOSIS — Z79899 Other long term (current) drug therapy: Secondary | ICD-10-CM | POA: Diagnosis not present

## 2012-02-06 DIAGNOSIS — E669 Obesity, unspecified: Secondary | ICD-10-CM | POA: Diagnosis not present

## 2012-02-06 HISTORY — DX: Non-ST elevation (NSTEMI) myocardial infarction: I21.4

## 2012-02-09 ENCOUNTER — Ambulatory Visit
Admission: RE | Admit: 2012-02-09 | Discharge: 2012-02-09 | Disposition: A | Discharge status: Home / Self Care | Payer: Medicare Other | Source: Ambulatory Visit | Attending: Family Medicine | Admitting: Family Medicine

## 2012-02-09 DIAGNOSIS — Z1231 Encounter for screening mammogram for malignant neoplasm of breast: Secondary | ICD-10-CM

## 2012-03-01 DIAGNOSIS — H40019 Open angle with borderline findings, low risk, unspecified eye: Secondary | ICD-10-CM | POA: Diagnosis not present

## 2012-03-04 ENCOUNTER — Encounter (HOSPITAL_COMMUNITY): Payer: Self-pay | Admitting: Cardiology

## 2012-03-04 ENCOUNTER — Inpatient Hospital Stay (HOSPITAL_COMMUNITY)
Admission: EM | Admit: 2012-03-04 | Discharge: 2012-03-08 | DRG: 281 | Disposition: A | Discharge status: Home / Self Care | Payer: Medicare Other | Attending: Internal Medicine | Admitting: Internal Medicine

## 2012-03-04 DIAGNOSIS — E039 Hypothyroidism, unspecified: Secondary | ICD-10-CM | POA: Diagnosis present

## 2012-03-04 DIAGNOSIS — I4892 Unspecified atrial flutter: Secondary | ICD-10-CM | POA: Diagnosis present

## 2012-03-04 DIAGNOSIS — D649 Anemia, unspecified: Secondary | ICD-10-CM | POA: Diagnosis present

## 2012-03-04 DIAGNOSIS — I480 Paroxysmal atrial fibrillation: Secondary | ICD-10-CM

## 2012-03-04 DIAGNOSIS — I248 Other forms of acute ischemic heart disease: Secondary | ICD-10-CM | POA: Diagnosis present

## 2012-03-04 DIAGNOSIS — I4891 Unspecified atrial fibrillation: Principal | ICD-10-CM | POA: Diagnosis present

## 2012-03-04 DIAGNOSIS — I422 Other hypertrophic cardiomyopathy: Secondary | ICD-10-CM | POA: Diagnosis present

## 2012-03-04 DIAGNOSIS — I5032 Chronic diastolic (congestive) heart failure: Secondary | ICD-10-CM | POA: Diagnosis present

## 2012-03-04 DIAGNOSIS — R079 Chest pain, unspecified: Secondary | ICD-10-CM | POA: Diagnosis not present

## 2012-03-04 DIAGNOSIS — I495 Sick sinus syndrome: Secondary | ICD-10-CM

## 2012-03-04 DIAGNOSIS — R0602 Shortness of breath: Secondary | ICD-10-CM | POA: Diagnosis not present

## 2012-03-04 DIAGNOSIS — I959 Hypotension, unspecified: Secondary | ICD-10-CM | POA: Diagnosis present

## 2012-03-04 DIAGNOSIS — I214 Non-ST elevation (NSTEMI) myocardial infarction: Secondary | ICD-10-CM | POA: Diagnosis not present

## 2012-03-04 DIAGNOSIS — I4819 Other persistent atrial fibrillation: Secondary | ICD-10-CM | POA: Diagnosis present

## 2012-03-04 DIAGNOSIS — I10 Essential (primary) hypertension: Secondary | ICD-10-CM | POA: Diagnosis present

## 2012-03-04 DIAGNOSIS — R001 Bradycardia, unspecified: Secondary | ICD-10-CM | POA: Diagnosis present

## 2012-03-04 DIAGNOSIS — Z23 Encounter for immunization: Secondary | ICD-10-CM

## 2012-03-04 DIAGNOSIS — R42 Dizziness and giddiness: Secondary | ICD-10-CM | POA: Diagnosis not present

## 2012-03-04 DIAGNOSIS — E785 Hyperlipidemia, unspecified: Secondary | ICD-10-CM | POA: Diagnosis present

## 2012-03-04 DIAGNOSIS — Z87891 Personal history of nicotine dependence: Secondary | ICD-10-CM

## 2012-03-04 DIAGNOSIS — I2510 Atherosclerotic heart disease of native coronary artery without angina pectoris: Secondary | ICD-10-CM | POA: Diagnosis present

## 2012-03-04 DIAGNOSIS — N179 Acute kidney failure, unspecified: Secondary | ICD-10-CM | POA: Diagnosis present

## 2012-03-04 DIAGNOSIS — J449 Chronic obstructive pulmonary disease, unspecified: Secondary | ICD-10-CM | POA: Insufficient documentation

## 2012-03-04 DIAGNOSIS — J4489 Other specified chronic obstructive pulmonary disease: Secondary | ICD-10-CM | POA: Insufficient documentation

## 2012-03-04 DIAGNOSIS — I2489 Other forms of acute ischemic heart disease: Secondary | ICD-10-CM | POA: Diagnosis present

## 2012-03-04 DIAGNOSIS — H3530 Unspecified macular degeneration: Secondary | ICD-10-CM | POA: Diagnosis present

## 2012-03-04 DIAGNOSIS — Z79899 Other long term (current) drug therapy: Secondary | ICD-10-CM

## 2012-03-04 HISTORY — DX: Other hypertrophic cardiomyopathy: I42.2

## 2012-03-04 HISTORY — DX: Bradycardia, unspecified: R00.1

## 2012-03-04 HISTORY — DX: Chronic diastolic (congestive) heart failure: I50.32

## 2012-03-04 LAB — CBC
MCH: 28.9 pg (ref 26.0–34.0)
MCHC: 32.8 g/dL (ref 30.0–36.0)
MCV: 88.4 fL (ref 78.0–100.0)
MCV: 88.5 fL (ref 78.0–100.0)
Platelets: 294 10*3/uL (ref 150–400)
Platelets: 303 10*3/uL (ref 150–400)
RBC: 4.43 MIL/uL (ref 3.87–5.11)
RDW: 15.4 % (ref 11.5–15.5)
RDW: 15.5 % (ref 11.5–15.5)
WBC: 9.2 10*3/uL (ref 4.0–10.5)

## 2012-03-04 LAB — BASIC METABOLIC PANEL
Calcium: 8.8 mg/dL (ref 8.4–10.5)
Creatinine, Ser: 0.91 mg/dL (ref 0.50–1.10)
GFR calc Af Amer: 75 mL/min — ABNORMAL LOW (ref >90)

## 2012-03-04 LAB — POCT I-STAT TROPONIN I: Troponin i, poc: 0.01 ng/mL (ref 0.00–0.08)

## 2012-03-04 MED ORDER — ACETAMINOPHEN 650 MG RE SUPP: 650.0000 mg | Freq: Four times a day (QID) | RECTAL | Status: DC | PRN

## 2012-03-04 MED ORDER — SODIUM CHLORIDE 0.9 % IJ SOLN: 3.0000 mL | Freq: Two times a day (BID) | INTRAMUSCULAR | Status: DC

## 2012-03-04 MED ORDER — NITROGLYCERIN 0.4 MG SL SUBL: 0.4000 mg | SUBLINGUAL_TABLET | SUBLINGUAL | Status: DC | PRN

## 2012-03-04 MED ORDER — FLECAINIDE ACETATE 100 MG PO TABS
300.0000 mg | ORAL_TABLET | Freq: Once | ORAL | Status: AC
  Administered 2012-03-04: 300 mg via ORAL
  Filled 2012-03-04: qty 3

## 2012-03-04 MED ORDER — LISINOPRIL 20 MG PO TABS
20.0000 mg | ORAL_TABLET | Freq: Every day | ORAL | Status: DC
  Filled 2012-03-04: qty 1

## 2012-03-04 MED ORDER — LEVOTHYROXINE SODIUM 88 MCG PO TABS
88.0000 ug | ORAL_TABLET | Freq: Every day | ORAL | Status: DC
  Administered 2012-03-06 – 2012-03-08 (×3): 88 ug via ORAL
  Filled 2012-03-04 (×5): qty 1

## 2012-03-04 MED ORDER — ALBUTEROL SULFATE HFA 108 (90 BASE) MCG/ACT IN AERS
2.0000 | INHALATION_SPRAY | Freq: Four times a day (QID) | RESPIRATORY_TRACT | Status: DC | PRN
  Administered 2012-03-07: 2 via RESPIRATORY_TRACT
  Filled 2012-03-04: qty 6.7

## 2012-03-04 MED ORDER — SODIUM CHLORIDE 0.9 % IJ SOLN
3.0000 mL | Freq: Two times a day (BID) | INTRAMUSCULAR | Status: DC
  Administered 2012-03-05 – 2012-03-07 (×4): 3 mL via INTRAVENOUS

## 2012-03-04 MED ORDER — ONDANSETRON HCL 4 MG/2ML IJ SOLN
4.0000 mg | Freq: Four times a day (QID) | INTRAMUSCULAR | Status: DC | PRN
  Administered 2012-03-05: 4 mg via INTRAVENOUS
  Filled 2012-03-04: qty 2

## 2012-03-04 MED ORDER — METOPROLOL TARTRATE 1 MG/ML IV SOLN
5.0000 mg | Freq: Once | INTRAVENOUS | Status: AC
  Administered 2012-03-04: 5 mg via INTRAVENOUS
  Filled 2012-03-04: qty 5

## 2012-03-04 MED ORDER — ENOXAPARIN SODIUM 40 MG/0.4ML ~~LOC~~ SOLN
40.0000 mg | SUBCUTANEOUS | Status: DC
  Filled 2012-03-04: qty 0.4

## 2012-03-04 MED ORDER — MORPHINE SULFATE 4 MG/ML IJ SOLN
4.0000 mg | Freq: Once | INTRAMUSCULAR | Status: AC
  Administered 2012-03-04: 4 mg via INTRAVENOUS
  Filled 2012-03-04: qty 1

## 2012-03-04 MED ORDER — SODIUM CHLORIDE 0.9 % IV BOLUS (SEPSIS)
1000.0000 mL | Freq: Once | INTRAVENOUS | Status: AC
  Administered 2012-03-04: 1000 mL via INTRAVENOUS

## 2012-03-04 MED ORDER — ASPIRIN EC 325 MG PO TBEC
325.0000 mg | DELAYED_RELEASE_TABLET | Freq: Every day | ORAL | Status: DC
  Administered 2012-03-05 – 2012-03-06 (×2): 325 mg via ORAL
  Filled 2012-03-04 (×2): qty 1

## 2012-03-04 MED ORDER — DILTIAZEM HCL ER COATED BEADS 120 MG PO CP24
120.0000 mg | ORAL_CAPSULE | Freq: Once | ORAL | Status: AC
  Administered 2012-03-04: 120 mg via ORAL
  Filled 2012-03-04: qty 1

## 2012-03-04 MED ORDER — DILTIAZEM HCL 50 MG/10ML IV SOLN
20.0000 mg | Freq: Once | INTRAVENOUS | Status: AC
  Administered 2012-03-04: 20 mg via INTRAVENOUS
  Filled 2012-03-04: qty 4

## 2012-03-04 MED ORDER — ONDANSETRON HCL 4 MG PO TABS: 4.0000 mg | ORAL_TABLET | Freq: Four times a day (QID) | ORAL | Status: DC | PRN

## 2012-03-04 MED ORDER — MOMETASONE FURO-FORMOTEROL FUM 100-5 MCG/ACT IN AERO
2.0000 | INHALATION_SPRAY | Freq: Two times a day (BID) | RESPIRATORY_TRACT | Status: DC
  Administered 2012-03-05 – 2012-03-06 (×3): 2 via RESPIRATORY_TRACT
  Filled 2012-03-04: qty 8.8

## 2012-03-04 MED ORDER — ASPIRIN EC 325 MG PO TBEC: 325.0000 mg | DELAYED_RELEASE_TABLET | Freq: Every day | ORAL | Status: DC

## 2012-03-04 MED ORDER — METOPROLOL SUCCINATE ER 25 MG PO TB24
25.0000 mg | ORAL_TABLET | Freq: Every day | ORAL | Status: DC
  Filled 2012-03-04: qty 1

## 2012-03-04 MED ORDER — ATORVASTATIN CALCIUM 40 MG PO TABS
40.0000 mg | ORAL_TABLET | Freq: Every day | ORAL | Status: DC
  Administered 2012-03-05 – 2012-03-07 (×3): 40 mg via ORAL
  Filled 2012-03-04 (×4): qty 1

## 2012-03-04 MED ORDER — ACETAMINOPHEN 325 MG PO TABS: 650.0000 mg | ORAL_TABLET | Freq: Four times a day (QID) | ORAL | Status: DC | PRN

## 2012-03-04 MED ORDER — FUROSEMIDE 20 MG PO TABS
20.0000 mg | ORAL_TABLET | Freq: Every day | ORAL | Status: DC
  Filled 2012-03-04: qty 1

## 2012-03-04 NOTE — ED Provider Notes (Signed)
History     CSN: [id number]  Arrival date & time 03/04/12  1821   First MD Initiated Contact with Patient 03/04/12 1824      Chief Complaint  Patient presents with  . Chest Pain    (Consider location/radiation/quality/duration/timing/severity/associated sxs/prior treatment) Patient is a 66 y.o. female presenting with chest pain. The history is provided by the patient.  Chest Pain The chest pain began 3 - 5 hours ago. Chest pain occurs constantly. The chest pain is improving. Associated with: resting. At its most intense, the pain is at 8/10. The pain is currently at 7/10. The severity of the pain is moderate. The quality of the pain is described as pressure-like. The pain does not radiate. Exacerbated by: nothing. Primary symptoms include palpitations. Pertinent negatives for primary symptoms include no fever, no shortness of breath, no cough, no abdominal pain, no nausea and no vomiting.  The palpitations did not occur with shortness of breath.   Her past medical history is significant for arrhythmia (Hx of afib).     Past Medical History  Diagnosis Date  . Hypertension   . Hyperlipidemia   . Hypothyroid   . COPD (chronic obstructive pulmonary disease)   . Dizziness     had PT for being off balance - in approx. 2012  . Dysrhythmia     paroxysmal atrial fibrillation  . Lumbar disc disease   . Macular degeneration   . Diastolic dysfunction     Past Surgical History  Procedure Date  . Cesarean section 1983    Family History  Problem Relation Age of Onset  . Heart disease Sister     Good Pastures Syndrome  . Gout Father   . Lung cancer Father   . Breast cancer Paternal Aunt     History  Substance Use Topics  . Smoking status: Former Smoker -- 0.3 packs/day for 44 years    Types: Cigarettes    Quit date: 04/07/2009  . Smokeless tobacco: Never Used  . Alcohol Use: Yes     Comment: 2 to 3 glasses wine per week    OB History    Grav Para Term Preterm  Abortions TAB SAB Ect Mult Living                  Review of Systems  Constitutional: Negative for fever.  Respiratory: Negative for cough and shortness of breath.   Cardiovascular: Positive for chest pain and palpitations.  Gastrointestinal: Negative for nausea, vomiting and abdominal pain.  All other systems reviewed and are negative.    Allergies  Review of patient's allergies indicates no known allergies.  Home Medications   Current Outpatient Rx  Name  Route  Sig  Dispense  Refill  . ALBUTEROL SULFATE HFA 108 (90 BASE) MCG/ACT IN AERS   Inhalation   Inhale 2 puffs into the lungs every 6 (six) hours as needed. Shortness of breath         . ASPIRIN 325 MG PO TBEC   Oral   Take 325 mg by mouth daily.         . ATORVASTATIN CALCIUM 40 MG PO TABS   Oral   Take 40 mg by mouth daily.           Marland Kitchen FLUTICASONE-SALMETEROL 100-50 MCG/DOSE IN AEPB   Inhalation   Inhale 1 puff into the lungs 2 (two) times daily.           . FUROSEMIDE 20 MG PO TABS   Oral  Take 1 tablet (20 mg total) by mouth daily.   30 tablet   11   . LEVOTHYROXINE SODIUM 88 MCG PO TABS   Oral   Take 88 mcg by mouth daily.           Marland Kitchen LISINOPRIL 20 MG PO TABS   Oral   Take 1 tablet (20 mg total) by mouth daily.   30 tablet   11   . METOPROLOL SUCCINATE ER 25 MG PO TB24   Oral   Take 1 tablet (25 mg total) by mouth daily.   30 tablet   11   . NITROGLYCERIN 0.4 MG SL SUBL   Sublingual   Place 1 tablet (0.4 mg total) under the tongue every 5 (five) minutes as needed for chest pain.   25 tablet   5     BP 146/88  Temp 97.8 F (36.6 C) (Oral)  Resp 16  SpO2 98%  Physical Exam  Nursing note and vitals reviewed. Constitutional: She is oriented to person, place, and time. She appears well-developed and well-nourished. No distress.  HENT:  Head: Normocephalic and atraumatic.  Eyes: EOM are normal. Pupils are equal, round, and reactive to light.  Neck: Normal range of motion.  Neck supple. No JVD present.  Cardiovascular: Regular rhythm.  Tachycardia present.  Exam reveals no friction rub.   No murmur heard. Pulmonary/Chest: Effort normal and breath sounds normal. No respiratory distress. She has no wheezes. She has no rales.  Abdominal: Soft. She exhibits no distension. There is no tenderness. There is no rebound.  Musculoskeletal: Normal range of motion. She exhibits no edema.  Neurological: She is alert and oriented to person, place, and time.  Skin: She is not diaphoretic.    ED Course  Procedures (including critical care time)  Labs Reviewed  BASIC METABOLIC PANEL - Abnormal; Notable for the following:    GFR calc non Af Amer 64 (*)     GFR calc Af Amer 75 (*)     All other components within normal limits  CBC  POCT I-STAT TROPONIN I  TSH  TROPONIN I  TROPONIN I  TROPONIN I  COMPREHENSIVE METABOLIC PANEL  CBC WITH DIFFERENTIAL  CBC  CBC  CREATININE, SERUM    No results found.   1. Atrial fibrillation with RVR   2. COPD (chronic obstructive pulmonary disease)   3. HTN (hypertension)   4. Hypothyroidism      Date: 03/04/2012  Rate 142  Rhythm: atrial flutter  QRS Axis: normal  Intervals: normal  ST/T Wave abnormalities: normal  Conduction Disutrbances:none  Narrative Interpretation:   Old EKG Reviewed: changes noted and never previoulsy in flutter    Date: 03/04/2012  Rate: 70  Rhythm: atrial fibrillation  QRS Axis: normal  Intervals: normal  ST/T Wave abnormalities: T-wave depressions in inferior and lateral leads  Conduction Disutrbances:none  Narrative Interpretation:   Old EKG Reviewed: This EKG was obtained after rate controlled with diltiazem bolus.    .     MDM   11F with hx of paroxysmal Afib p/w palpitations and chest pain. Patient was watching a movie and felt her heart start racing and had some nonradiating substernal pressure-like chest pain. She is a CHADS score of 1 and takes daily ASA and metoprolol. Had  a L heart cath without any intervention previously. Exam reveals tachycardia, otherwise benign. EKG states sinus tachycardia, however concern for aflutter based on her regular rhythm, rate of 140, and flutter wave patter in Lead  II. Will draw troponin and try to control with metoprolol.  After 2 rounds of metoprolol 5 mg IV, still tachycardic. Will give Diltiazem. Patient is older and not a good candidate for sedation.  Patient's rate is controlled after a diltiazem bolus. I spoke with cardiology he states to merit medicine and observe her overnight. He also recommended starting extended release diltiazem to keep her rate controlled. Medicine admitting.  Dr. Tempie Hoist with cardiology called me and states that  he would like to give her flecainide to convert her and discharge her home. Patient states she can go home now as she is reporting she is being admitted in no acute get her period patient would rather stay in the hospital. We spoke with Dr. Tempie Hoist and he recommended for tonight and he will see her in the morning after her she is admitted. 300 mg of flecainide given.  Admitted to medicine. Evelina Bucy, MD 03/04/12 [phone]

## 2012-03-04 NOTE — ED Notes (Signed)
BP before metoprolol admin 132/98.

## 2012-03-04 NOTE — Consult Note (Addendum)
CARDIOLOGY CONSULT  Primary Physician: Dr. Harlene Ramus Primary Cardiologist: Dr. Fransico Him Reason for Consultation: AF/AFL with RVR   HPI:  66 y/o woman with COPD, PAF, HTN and mild CAD followed by Dr. Radford Pax.  Saw Dr. Radford Pax earlier this year for DOE. Echo and stress test were normal. In 6/13 had palpitations and chest pain which came on abruptly while eating lunch. She went to the ER and found to have AF with RVR. Treated with cardizem and converted spontaneously.  Due to recurrent CP, underwent cath on 10/07/11: EF 60% with essentially normal coronaries except for 80% lesion in very small D2 not amenable to PCI. Treated medically. Has done pretty well since that time.  Yesterday afternoon at the movies developed abrupt onset of recurrent palpitations and CP. Came to ER and initial ECG was AFL at 142. Apparently no significant effect from lopressor. Started on diltiazem and went into AF in 70-80s. At my request, given flecainide 3104m x 1 in ER and subsequently converted to sinus brady. CE negative initially but then went to 0.44->0.97 this am.   While on diltiazem developed profound bradycardia with HRs in 25-45 range associated with nausea. Diltiazem turned off HR remains in mid 40s but now feels fine. Has ST depression on tele. She does have a h/o bradycardia.   Review of Systems:     Cardiac Review of Systems: {Y] = yes _0  = no  Chest Pain [ y   ]  Resting SOB [   ] Exertional SOB  [  y]  Orthopnea [  ]   Pedal Edema [   ]    Palpitations [Blue.Reese ] Syncope  [  ]   Presyncope [   ]  General Review of Systems: [Y] = yes [  ]=no Constitional: recent weight change [  ]; anorexia [  ]; fatigue [ y ]; nausea [  ]; night sweats [  ]; fever [  ]; or chills [  ];                                                                                                                                          Dental: poor dentition[  ];   Eye : blurred vision [  ]; diplopia [   ]; vision changes [   ];  Amaurosis fugax[  ]; Resp: cough [  ];  wheezing[  ];  hemoptysis[  ]; shortness of breath[  y]; paroxysmal nocturnal dyspnea[  ]; dyspnea on exertion[ y ]; or orthopnea[  ];  GI:  gallstones[  ], vomiting[  ];  dysphagia[  ]; melena[  ];  hematochezia [  ]; heartburn[  ];   Hx of  Colonoscopy[  ]; GU: kidney stones [  ]; hematuria[  ];   dysuria [  ];  nocturia[  ];  history of     obstruction [  ];  Skin: rash, swelling[  ];, hair loss[  ];  peripheral edema[  ];  or itching[  ]; Musculosketetal: myalgias[  ];  joint swelling[  ];  joint erythema[  ];  joint pain[ y ];  back pain[  ];  Heme/Lymph: bruising[  ];  bleeding[  ];  anemia[  ];  Neuro: TIA[  ];  headaches[  ];  stroke[  ];  vertigo[  ];  seizures[  ];   paresthesias[  ];  difficulty walking[  ];  Psych:depression[  ]; anxiety[  ];  Endocrine: diabetes[  ];  thyroid dysfunction[  ];  Other:  Past Medical History  Diagnosis Date  . Hypertension   . Hyperlipidemia   . Hypothyroid   . COPD (chronic obstructive pulmonary disease)   . Dizziness     had PT for being off balance - in approx. 2012  . Dysrhythmia     paroxysmal atrial fibrillation  . Lumbar disc disease   . Macular degeneration   . Diastolic dysfunction       . [COMPLETED] diltiazem  120 mg Oral Once  . flecainide  300 mg Oral Once  . [COMPLETED] metoprolol  5 mg Intravenous Once  . [COMPLETED] metoprolol  5 mg Intravenous Once  . [COMPLETED]  morphine injection  4 mg Intravenous Once    Infusions:    . [COMPLETED] diltiazem Stopped (03/04/12 2100)  . [COMPLETED] sodium chloride Stopped (03/04/12 2247)    No Known Allergies  History   Social History  . Marital Status: Divorced    Spouse Name: divorced.    Number of Children: 1  . Years of Education: N/A   Occupational History  . retired. still works for [employer].     Social History Main Topics  . Smoking status: Former Smoker -- 0.3 packs/day for 44 years    Types:  Cigarettes    Quit date: 04/07/2009  . Smokeless tobacco: Never Used  . Alcohol Use: Yes     Comment: 2 to 3 glasses wine per week  . Drug Use: No  . Sexually Active: Not on file   Other Topics Concern  . Not on file   Social History Narrative   Pt lives alone with 2 dogs.     Family History  Problem Relation Age of Onset  . Heart disease Sister     Good Pastures Syndrome  . Gout Father   . Lung cancer Father   . Breast cancer Paternal Aunt     PHYSICAL EXAM: Filed Vitals:   03/04/12 2300  BP: 110/72  Pulse: 80  Temp:   Resp: 17    General: mildly obese. No respiratory difficulty HEENT: normal Neck: supple. no JVD. Carotids 2+ bilat; no bruits. No lymphadenopathy or thryomegaly appreciated. Cor: PMI nondisplaced. Irregular rate & rhythm. No rubs, gallops or murmurs. Lungs: clear with mildly decreased breath sounds throughout Abdomen: soft, nontender, nondistended. No hepatosplenomegaly. No bruits or masses. Good bowel sounds. Extremities: no cyanosis, clubbing, rash, edema Neuro: alert & oriented x 3, cranial nerves grossly intact. moves all 4 extremities w/o difficulty. Affect pleasant.  ECG: AFL 142. No ST-T wave abnormalities.   On tele   Results for orders placed during the hospital encounter of 03/04/12 (from the past 24 hour(s))  CBC     Status: Normal   Collection Time   03/04/12  6:34 PM      Component Value Range   WBC 8.0  4.0 - 10.5 K/uL   RBC 4.56  3.87 - 5.11 MIL/uL   Hemoglobin 13.2  12.0 - 15.0 g/dL   HCT 40.3  36.0 - 46.0 %   MCV 88.4  78.0 - 100.0 fL   MCH 28.9  26.0 - 34.0 pg   MCHC 32.8  30.0 - 36.0 g/dL   RDW 15.4  11.5 - 15.5 %   Platelets 303  150 - 400 K/uL  BASIC METABOLIC PANEL     Status: Abnormal   Collection Time   03/04/12  6:34 PM      Component Value Range   Sodium 141  135 - 145 mEq/L   Potassium 3.5  3.5 - 5.1 mEq/L   Chloride 106  96 - 112 mEq/L   CO2 26  19 - 32 mEq/L   Glucose, Bld 83  70 - 99 mg/dL   BUN 11  6  - 23 mg/dL   Creatinine, Ser 0.91  0.50 - 1.10 mg/dL   Calcium 8.8  8.4 - 10.5 mg/dL   GFR calc non Af Amer 64 (*) >90 mL/min   GFR calc Af Amer 75 (*) >90 mL/min  POCT I-STAT TROPONIN I     Status: Normal   Collection Time   03/04/12  7:55 PM      Component Value Range   Troponin i, poc 0.01  0.00 - 0.08 ng/mL   Comment 3           TROPONIN I     Status: Abnormal   Collection Time   03/04/12 11:32 PM      Component Value Range   Troponin I 0.44 (*) <0.30 ng/mL  CBC     Status: Normal   Collection Time   03/04/12 11:32 PM      Component Value Range   WBC 9.2  4.0 - 10.5 K/uL   RBC 4.43  3.87 - 5.11 MIL/uL   Hemoglobin 12.9  12.0 - 15.0 g/dL   HCT 39.2  36.0 - 46.0 %   MCV 88.5  78.0 - 100.0 fL   MCH 29.1  26.0 - 34.0 pg   MCHC 32.9  30.0 - 36.0 g/dL   RDW 15.5  11.5 - 15.5 %   Platelets 294  150 - 400 K/uL  CREATININE, SERUM     Status: Abnormal   Collection Time   03/04/12 11:32 PM      Component Value Range   Creatinine, Ser 0.92  0.50 - 1.10 mg/dL   GFR calc non Af Amer 63 (*) >90 mL/min   GFR calc Af Amer 74 (*) >90 mL/min  COMPREHENSIVE METABOLIC PANEL     Status: Abnormal   Collection Time   03/05/12  4:20 AM      Component Value Range   Sodium 140  135 - 145 mEq/L   Potassium 3.8  3.5 - 5.1 mEq/L   Chloride 109  96 - 112 mEq/L   CO2 21  19 - 32 mEq/L   Glucose, Bld 130 (*) 70 - 99 mg/dL   BUN 11  6 - 23 mg/dL   Creatinine, Ser 1.11 (*) 0.50 - 1.10 mg/dL   Calcium 7.8 (*) 8.4 - 10.5 mg/dL   Total Protein 6.3  6.0 - 8.3 g/dL   Albumin 3.2 (*) 3.5 - 5.2 g/dL   AST 17  0 - 37 U/L   ALT 10  0 - 35 U/L   Alkaline Phosphatase 69  39 - 117 U/L   Total Bilirubin 0.2 (*) 0.3 - 1.2 mg/dL  GFR calc non Af Amer 51 (*) >90 mL/min   GFR calc Af Amer 59 (*) >90 mL/min  CBC WITH DIFFERENTIAL     Status: Abnormal   Collection Time   03/05/12  4:20 AM      Component Value Range   WBC 8.0  4.0 - 10.5 K/uL   RBC 3.95  3.87 - 5.11 MIL/uL   Hemoglobin 11.5 (*) 12.0 -  15.0 g/dL   HCT 35.4 (*) 36.0 - 46.0 %   MCV 89.6  78.0 - 100.0 fL   MCH 29.1  26.0 - 34.0 pg   MCHC 32.5  30.0 - 36.0 g/dL   RDW 15.7 (*) 11.5 - 15.5 %   Platelets 269  150 - 400 K/uL   Neutrophils Relative 64  43 - 77 %   Neutro Abs 5.1  1.7 - 7.7 K/uL   Lymphocytes Relative 27  12 - 46 %   Lymphs Abs 2.2  0.7 - 4.0 K/uL   Monocytes Relative 7  3 - 12 %   Monocytes Absolute 0.6  0.1 - 1.0 K/uL   Eosinophils Relative 2  0 - 5 %   Eosinophils Absolute 0.1  0.0 - 0.7 K/uL   Basophils Relative 0  0 - 1 %   Basophils Absolute 0.0  0.0 - 0.1 K/uL  TROPONIN I     Status: Abnormal   Collection Time   03/05/12  4:20 AM      Component Value Range   Troponin I 0.97 (*) <0.30 ng/mL   No results found.   ASSESSMENT: 1) PAF/AFL     --CHADS2 = 1 (HTN), CHA2DS,-VASc = 3 2) Tachy/brady syndrome 3) NSTEMI, type II     --Cath in 7/13 with 80% lesion in small D2 (too small for PCI) 4) HTN 5) HL 6) COPD with h/o tobacco use quit 2011 7) Hypokalemia  PLAN/DISCUSSION:  This is a bit of a difficult situation. She is now back in SR but is quite slow, precluding the use of significant AV-nodal blockers. In addition, she does havevery prominent symptoms when she has either AFL and AF and has actually ruled in for demand NSTEMI. Thus, I think she needs a rhythm control strategy. With her bradycardia and CAD, only possible agents may be Tikosyn. Will get ECG this am to look at QT. If not candidate for Tikosyn may need EP to see to consider ablation sooner rather than later. Also, will likely need anti-coagulation and I favor NOAC like Xarelto or Eliquis. We discussed this but I will leave to Dr. Theodosia Blender discretion.   Daniel Bensimhon,MD 6:29 AM  Hemoglobin also dropped this am on labs. Will need to repeat later today.   Benay Spice 6:33 AM

## 2012-03-04 NOTE — ED Notes (Signed)
Bp before 2nd metoprolol dose admin - 131/93. Hr - 135

## 2012-03-04 NOTE — ED Notes (Signed)
Pt to department via EMS- reports she was in the movies and started having midsternal chest pain, SOB and dizziness. Pt took 1 nitro, and also had 325 asa PTA. Hr-140 Bp-100. 22g R hand.

## 2012-03-04 NOTE — H&P (Signed)
Norris Bodley is an 66 y.o. female.   Patient was seen and examined on March 04, 2012. PCP - Dr. Harlene Ramus. Cardiologist - Dr. Stephens Shire. Chief Complaint: Palpitations. HPI: 66 year old female with history of paroxysmal atrial fibrillation, COPD presents with complaints of palpitations. Patient's palpitations started this evening after patient was coming out of a movie theater. Patient also had associated chest pressure, retrosternally and nonradiating. Denies masses or shortness of breath diaphoresis nausea vomiting. In the ER patient was found to be in atrial flutter fibrillation with RVR. Was initially given Cardizem 20 mg IV followed by Cardizem CD 120 mg by mouth and was started on flecainide after discussing with cardiologist on call. Patient's heart rate improved with Cardizem bolus and at this time rate is controlled but still in A. fib. Patient will be admitted for further observation. Patient's chest pain resolved with control of heart rate.  Past Medical History  Diagnosis Date  . Hypertension   . Hyperlipidemia   . Hypothyroid   . COPD (chronic obstructive pulmonary disease)   . Dizziness     had PT for being off balance - in approx. 2012  . Dysrhythmia     paroxysmal atrial fibrillation  . Lumbar disc disease   . Macular degeneration   . Diastolic dysfunction     Past Surgical History  Procedure Date  . Cesarean section 1983    Family History  Problem Relation Age of Onset  . Heart disease Sister     Good Pastures Syndrome  . Gout Father   . Lung cancer Father   . Breast cancer Paternal Aunt    Social History:  reports that she quit smoking about 2 years ago. Her smoking use included Cigarettes. She has a 13.2 pack-year smoking history. She has never used smokeless tobacco. She reports that she drinks alcohol. She reports that she does not use illicit drugs.  Allergies: No Known Allergies   (Not in a hospital admission)  Results for orders  placed during the hospital encounter of 03/04/12 (from the past 48 hour(s))  CBC     Status: Normal   Collection Time   03/04/12  6:34 PM      Component Value Range Comment   WBC 8.0  4.0 - 10.5 K/uL    RBC 4.56  3.87 - 5.11 MIL/uL    Hemoglobin 13.2  12.0 - 15.0 g/dL    HCT 40.3  36.0 - 46.0 %    MCV 88.4  78.0 - 100.0 fL    MCH 28.9  26.0 - 34.0 pg    MCHC 32.8  30.0 - 36.0 g/dL    RDW 15.4  11.5 - 15.5 %    Platelets 303  150 - 400 K/uL   BASIC METABOLIC PANEL     Status: Abnormal   Collection Time   03/04/12  6:34 PM      Component Value Range Comment   Sodium 141  135 - 145 mEq/L    Potassium 3.5  3.5 - 5.1 mEq/L    Chloride 106  96 - 112 mEq/L    CO2 26  19 - 32 mEq/L    Glucose, Bld 83  70 - 99 mg/dL    BUN 11  6 - 23 mg/dL    Creatinine, Ser 0.91  0.50 - 1.10 mg/dL    Calcium 8.8  8.4 - 10.5 mg/dL    GFR calc non Af Amer 64 (*) >90 mL/min    GFR calc Af Wyvonnia Lora  75 (*) >90 mL/min   POCT I-STAT TROPONIN I     Status: Normal   Collection Time   03/04/12  7:55 PM      Component Value Range Comment   Troponin i, poc 0.01  0.00 - 0.08 ng/mL    Comment 3             No results found.  Review of Systems  Constitutional: Negative.   HENT: Negative.   Eyes: Negative.   Respiratory: Negative.   Cardiovascular: Positive for chest pain and palpitations.  Gastrointestinal: Negative.   Genitourinary: Negative.   Musculoskeletal: Negative.   Skin: Negative.   Neurological: Negative.   Endo/Heme/Allergies: Negative.   Psychiatric/Behavioral: Negative.     Blood pressure 110/72, pulse 80, temperature 97.8 F (36.6 C), temperature source Oral, resp. rate 17, SpO2 96.00%. Physical Exam  Constitutional: She is oriented to person, place, and time. She appears well-developed and well-nourished. No distress.  HENT:  Head: Normocephalic and atraumatic.  Right Ear: External ear normal.  Left Ear: External ear normal.  Nose: Nose normal.  Mouth/Throat: Oropharynx is clear and  moist. No oropharyngeal exudate.  Eyes: Conjunctivae normal are normal. Pupils are equal, round, and reactive to light. Right eye exhibits no discharge. Left eye exhibits no discharge. No scleral icterus.  Neck: Normal range of motion. Neck supple.  Cardiovascular: Normal rate.        Irregular rhythm.  Respiratory: Effort normal and breath sounds normal. No respiratory distress. She has no wheezes. She has no rales.  GI: Soft. Bowel sounds are normal. She exhibits no distension. There is no tenderness. There is no rebound and no guarding.  Musculoskeletal: She exhibits no edema and no tenderness.  Neurological: She is alert and oriented to person, place, and time.       Moves all extremities.  Skin: Skin is warm and dry. She is not diaphoretic.     Assessment/Plan #1. A. fib with RVR - we'll continue with present medications including metoprolol and flecainide was added by the cardiologist. Closely monitor in telemetry. Patient is only on aspirin CHADS2 score is only one. #2. Chest pain - presently resolved. Patient had a cardiac catheter recently in July which was unremarkable. Cycle cardiac markers. Check chest x-ray. #3. COPD - presently not wheezing. Continue home inhalers. #4. Hypothyroidism - continue Synthroid. Check TSH.  CODE STATUS - full code.  Sheriden Archibeque N. 03/04/2012, 11:16 PM  Addendum - patient's cardiac enzymes have turned positive and was started on IV heparin infusion as recommended by cardiologist. Patient is on aspirin. Presently chest pain-free.  Gean Birchwood

## 2012-03-05 ENCOUNTER — Encounter (HOSPITAL_COMMUNITY): Payer: Self-pay | Admitting: Internal Medicine

## 2012-03-05 DIAGNOSIS — E785 Hyperlipidemia, unspecified: Secondary | ICD-10-CM | POA: Diagnosis present

## 2012-03-05 DIAGNOSIS — R079 Chest pain, unspecified: Secondary | ICD-10-CM | POA: Diagnosis not present

## 2012-03-05 DIAGNOSIS — I422 Other hypertrophic cardiomyopathy: Secondary | ICD-10-CM | POA: Diagnosis present

## 2012-03-05 DIAGNOSIS — I959 Hypotension, unspecified: Secondary | ICD-10-CM | POA: Diagnosis present

## 2012-03-05 DIAGNOSIS — I5032 Chronic diastolic (congestive) heart failure: Secondary | ICD-10-CM | POA: Diagnosis present

## 2012-03-05 DIAGNOSIS — I498 Other specified cardiac arrhythmias: Secondary | ICD-10-CM | POA: Diagnosis not present

## 2012-03-05 DIAGNOSIS — I4819 Other persistent atrial fibrillation: Secondary | ICD-10-CM | POA: Diagnosis present

## 2012-03-05 DIAGNOSIS — I4891 Unspecified atrial fibrillation: Secondary | ICD-10-CM | POA: Diagnosis not present

## 2012-03-05 DIAGNOSIS — I4892 Unspecified atrial flutter: Secondary | ICD-10-CM | POA: Diagnosis present

## 2012-03-05 DIAGNOSIS — I248 Other forms of acute ischemic heart disease: Secondary | ICD-10-CM | POA: Diagnosis not present

## 2012-03-05 DIAGNOSIS — I214 Non-ST elevation (NSTEMI) myocardial infarction: Secondary | ICD-10-CM | POA: Diagnosis not present

## 2012-03-05 DIAGNOSIS — J449 Chronic obstructive pulmonary disease, unspecified: Secondary | ICD-10-CM | POA: Diagnosis not present

## 2012-03-05 DIAGNOSIS — I2510 Atherosclerotic heart disease of native coronary artery without angina pectoris: Secondary | ICD-10-CM | POA: Diagnosis present

## 2012-03-05 DIAGNOSIS — I10 Essential (primary) hypertension: Secondary | ICD-10-CM | POA: Diagnosis not present

## 2012-03-05 DIAGNOSIS — I495 Sick sinus syndrome: Secondary | ICD-10-CM | POA: Insufficient documentation

## 2012-03-05 DIAGNOSIS — R001 Bradycardia, unspecified: Secondary | ICD-10-CM | POA: Diagnosis present

## 2012-03-05 DIAGNOSIS — N179 Acute kidney failure, unspecified: Secondary | ICD-10-CM | POA: Diagnosis not present

## 2012-03-05 DIAGNOSIS — Z23 Encounter for immunization: Secondary | ICD-10-CM | POA: Diagnosis not present

## 2012-03-05 DIAGNOSIS — I428 Other cardiomyopathies: Secondary | ICD-10-CM | POA: Diagnosis not present

## 2012-03-05 DIAGNOSIS — Z87891 Personal history of nicotine dependence: Secondary | ICD-10-CM | POA: Diagnosis not present

## 2012-03-05 DIAGNOSIS — I2489 Other forms of acute ischemic heart disease: Secondary | ICD-10-CM | POA: Diagnosis not present

## 2012-03-05 DIAGNOSIS — J4489 Other specified chronic obstructive pulmonary disease: Secondary | ICD-10-CM | POA: Diagnosis not present

## 2012-03-05 DIAGNOSIS — R0989 Other specified symptoms and signs involving the circulatory and respiratory systems: Secondary | ICD-10-CM | POA: Diagnosis not present

## 2012-03-05 DIAGNOSIS — Z79899 Other long term (current) drug therapy: Secondary | ICD-10-CM | POA: Diagnosis not present

## 2012-03-05 DIAGNOSIS — E039 Hypothyroidism, unspecified: Secondary | ICD-10-CM | POA: Diagnosis not present

## 2012-03-05 DIAGNOSIS — D649 Anemia, unspecified: Secondary | ICD-10-CM | POA: Diagnosis present

## 2012-03-05 DIAGNOSIS — H3530 Unspecified macular degeneration: Secondary | ICD-10-CM | POA: Diagnosis present

## 2012-03-05 LAB — COMPREHENSIVE METABOLIC PANEL
AST: 17 U/L (ref 0–37)
Alkaline Phosphatase: 69 U/L (ref 39–117)
BUN: 11 mg/dL (ref 6–23)
CO2: 21 mEq/L (ref 19–32)
Chloride: 109 mEq/L (ref 96–112)
Creatinine, Ser: 1.11 mg/dL — ABNORMAL HIGH (ref 0.50–1.10)
GFR calc non Af Amer: 51 mL/min — ABNORMAL LOW (ref >90)
Potassium: 3.8 mEq/L (ref 3.5–5.1)
Total Bilirubin: 0.2 mg/dL — ABNORMAL LOW (ref 0.3–1.2)

## 2012-03-05 LAB — CBC
HCT: 34.8 % — ABNORMAL LOW (ref 36.0–46.0)
Hemoglobin: 11.3 g/dL — ABNORMAL LOW (ref 12.0–15.0)
MCH: 29.1 pg (ref 26.0–34.0)
MCHC: 32.5 g/dL (ref 30.0–36.0)
MCV: 89.7 fL (ref 78.0–100.0)
RDW: 15.9 % — ABNORMAL HIGH (ref 11.5–15.5)

## 2012-03-05 LAB — CBC WITH DIFFERENTIAL/PLATELET
Basophils Absolute: 0 10*3/uL (ref 0.0–0.1)
Eosinophils Absolute: 0.1 10*3/uL (ref 0.0–0.7)
Eosinophils Relative: 2 % (ref 0–5)
Lymphs Abs: 2.2 10*3/uL (ref 0.7–4.0)
MCH: 29.1 pg (ref 26.0–34.0)
MCV: 89.6 fL (ref 78.0–100.0)
Neutrophils Relative %: 64 % (ref 43–77)
Platelets: 269 10*3/uL (ref 150–400)
RBC: 3.95 MIL/uL (ref 3.87–5.11)
RDW: 15.7 % — ABNORMAL HIGH (ref 11.5–15.5)
WBC: 8 10*3/uL (ref 4.0–10.5)

## 2012-03-05 LAB — HEPARIN LEVEL (UNFRACTIONATED): Heparin Unfractionated: 0.83 IU/mL — ABNORMAL HIGH (ref 0.30–0.70)

## 2012-03-05 LAB — CREATININE, SERUM
Creatinine, Ser: 0.92 mg/dL (ref 0.50–1.10)
GFR calc Af Amer: 74 mL/min — ABNORMAL LOW (ref >90)
GFR calc non Af Amer: 63 mL/min — ABNORMAL LOW (ref >90)

## 2012-03-05 LAB — TROPONIN I: Troponin I: 0.97 ng/mL (ref <0.30)

## 2012-03-05 MED ORDER — DILTIAZEM HCL 100 MG IV SOLR
10.0000 mg/h | INTRAVENOUS | Status: DC
  Administered 2012-03-05: 10 mg/h via INTRAVENOUS
  Filled 2012-03-05: qty 100

## 2012-03-05 MED ORDER — RIVAROXABAN 20 MG PO TABS: 20.0000 mg | ORAL_TABLET | Freq: Every day | ORAL | Status: DC

## 2012-03-05 MED ORDER — SODIUM CHLORIDE 0.9 % IV BOLUS (SEPSIS)
500.0000 mL | Freq: Once | INTRAVENOUS | Status: AC
  Administered 2012-03-05: 500 mL via INTRAVENOUS

## 2012-03-05 MED ORDER — OFF THE BEAT BOOK
Freq: Once | Status: AC
  Administered 2012-03-05: 09:00:00
  Filled 2012-03-05: qty 1

## 2012-03-05 MED ORDER — HEPARIN (PORCINE) IN NACL 100-0.45 UNIT/ML-% IJ SOLN
1050.0000 [IU]/h | INTRAMUSCULAR | Status: DC
  Administered 2012-03-05 (×2): 1050 [IU]/h via INTRAVENOUS
  Filled 2012-03-05 (×2): qty 250

## 2012-03-05 MED ORDER — HEPARIN BOLUS VIA INFUSION
3000.0000 [IU] | Freq: Once | INTRAVENOUS | Status: AC
  Administered 2012-03-05: 3000 [IU] via INTRAVENOUS
  Filled 2012-03-05: qty 3000

## 2012-03-05 MED ORDER — AMIODARONE HCL 200 MG PO TABS
400.0000 mg | ORAL_TABLET | Freq: Two times a day (BID) | ORAL | Status: DC
  Administered 2012-03-05 – 2012-03-08 (×7): 400 mg via ORAL
  Filled 2012-03-05 (×8): qty 2

## 2012-03-05 MED ORDER — HEPARIN (PORCINE) IN NACL 100-0.45 UNIT/ML-% IJ SOLN
1200.0000 [IU]/h | INTRAMUSCULAR | Status: DC
  Administered 2012-03-05: 1200 [IU]/h via INTRAVENOUS
  Filled 2012-03-05 (×2): qty 250

## 2012-03-05 NOTE — Progress Notes (Signed)
[facility] hospital[facility] progress note. Chief complaint. Tachycardia. This 66 year old female presented to the emergency room with complaints of palpitation. This was also associated with chest pressure. She was noted to be in atrial fib with rapid ventricular response. She was given the 20 mg IV Cardizem followed by Cardizem CD 120 mg by mouth and then started on flecainide per discussion with cardiologist on call Dr. Haroldine Laws. Once on the floor the patient again developed tachycardia in the 140s. The patient was started on a Cardizem drip per cardiology. Patient also noted to have a elevated troponin at 0.44 and for this the patient was placed on a heparin drip per cardiology. Later this a.m. the patient had converted to normal sinus rhythm and cardiology requested we continue the Cardizem drip as previous. I was called and notified that the patient had become bradycardic and hypotensive. Rapid response came to the bedside and cardiology was contacted. Cardiology requested discontinuance of the Cardizem drip and rapid response initiated a 500 cc normal saline bolus. I arrived to the floor to find the patient's heart rate had increased to the upper 50s and low 60s. Her blood pressure had improved to the 01T systolic. Vital signs. And apical pulse about 56, respirations 20, pressure last check systolic in the 14H. General appearance. Obese middle-aged female who is alert, cooperative and in no distress. She denies any chest pain or dyspnea. Cardiac. Rate and rhythm regular. No jugular venous distention or edema. No calf pain and negative Homans. Lungs. Breath sounds are clear and equal. Abdomen. Soft and obese with positive bowel sounds. No pain. Impression/plan. Atrial fib with rapid ventricular response. This followed by an incidence of bradycardia with heart rate as low as the 20s. Also hypotension with blood pressure as low as 60/40. Cardizem drip has been discontinued but cardiology. Fluid bolus 500 cc normal  saline initiated by rapid response. Both these actions appear to have improved pulse and blood pressure essentially back to normal range. Nursing will continue to monitor and update me as needed.

## 2012-03-05 NOTE — Progress Notes (Signed)
[facility] PROGRESS NOTE  Amy [name] YCX:[id number] DOB: 01/24/1946 DOA: 03/04/2012 PCP: [name], MD  Assessment/Plan: #1. A. fib with RVR - Closely monitor in telemetry. Converted back to sinus Appreciate cardiology/EPs help.  Heparin gtt (for increased troponin and a fib- may switch to oral agent tomm) stopped cardizem gtt- for hypotension will bolus with NS  #2. Chest pain - presently resolved. Patient had a cardiac catheter recently in July which was unremarkable. trend cardiac markers.   #3. COPD - presently not wheezing. Continue home inhalers.  #4. Hypothyroidism - continue Synthroid.  TSH high- on synthroid, may need to increase dose if patient has been taking as prescribed, ck free t4 #5. AKI- monitor in AM, giving IVF   Code Status: full Family Communication: patient at bedside Disposition Plan: home when better   Consultants:  cardiology  HPI/Subjective: Patient with no c/o currently. No fever, no chills  Objective: Filed Vitals:   03/05/12 0430 03/05/12 0640 03/05/12 0813 03/05/12 1002  BP: 92/42 92/44  74/50  Pulse:    58  Temp:      TempSrc:      Resp:      Height:      Weight:      SpO2:   96%     Intake/Output Summary (Last 24 hours) at 03/05/12 1038 Last data filed at 03/05/12 0215  Gross per 24 hour  Intake      0 ml  Output    300 ml  Net   -300 ml   Filed Weights   03/05/12 0000  Weight: 92.715 kg (204 lb 6.4 oz)    Exam:   General:  A+Ox3, NAD  Cardiovascular: rrr  Respiratory: clear anterior, no wheezing  Abdomen: +BS, soft, NT/ND  Data Reviewed: Basic Metabolic Panel:  Lab 49/70/26 0420 03/04/12 2332 03/04/12 1834  NA 140 -- 141  K 3.8 -- 3.5  CL 109 -- 106  CO2 21 -- 26  GLUCOSE 130* -- 83  BUN 11 -- 11  CREATININE 1.11* 0.92 0.91  CALCIUM 7.8* -- 8.8  MG -- -- --  PHOS -- -- --   Liver Function Tests:  Lab 03/05/12 0420  AST 17  ALT 10  ALKPHOS 69  BILITOT 0.2*  PROT 6.3  ALBUMIN  3.2*   No results found for this basename: LIPASE:5,AMYLASE:5 in the last 168 hours No results found for this basename: AMMONIA:5 in the last 168 hours CBC:  Lab 03/05/12 0420 03/04/12 2332 03/04/12 1834  WBC 8.0 9.2 8.0  NEUTROABS 5.1 -- --  HGB 11.5* 12.9 13.2  HCT 35.4* 39.2 40.3  MCV 89.6 88.5 88.4  PLT 269 294 303   Cardiac Enzymes:  Lab 03/05/12 0420 03/04/12 2332  CKTOTAL -- --  CKMB -- --  CKMBINDEX -- --  TROPONINI 0.97* 0.44*   BNP (last 3 results) No results found for this basename: PROBNP:3 in the last 8760 hours CBG: No results found for this basename: GLUCAP:5 in the last 168 hours  No results found for this or any previous visit (from the past 240 hour(s)).   Studies: No results found.  Scheduled Meds:   . aspirin EC  325 mg Oral Daily  . atorvastatin  40 mg Oral q1800  . [COMPLETED] diltiazem  120 mg Oral Once  . [COMPLETED] diltiazem  20 mg Intravenous Once  . [COMPLETED] flecainide  300 mg Oral Once  . [COMPLETED] heparin  3,000 Units Intravenous Once  . levothyroxine  88 mcg Oral QAC  breakfast  . [COMPLETED] metoprolol  5 mg Intravenous Once  . [COMPLETED] metoprolol  5 mg Intravenous Once  . mometasone-formoterol  2 puff Inhalation BID  . [COMPLETED]  morphine injection  4 mg Intravenous Once  . [COMPLETED] off the beat book   Does not apply Once  . rivaroxaban  20 mg Oral Daily  . [COMPLETED] sodium chloride  1,000 mL Intravenous Once  . sodium chloride  500 mL Intravenous Once  . sodium chloride  3 mL Intravenous Q12H  . sodium chloride  3 mL Intravenous Q12H  . [DISCONTINUED] aspirin  325 mg Oral Daily  . [DISCONTINUED] enoxaparin (LOVENOX) injection  40 mg Subcutaneous Q24H  . [DISCONTINUED] furosemide  20 mg Oral Daily  . [DISCONTINUED] lisinopril  20 mg Oral Daily  . [DISCONTINUED] metoprolol succinate  25 mg Oral Daily   Continuous Infusions:   . heparin 1,200 Units/hr (03/05/12 0210)  . [DISCONTINUED] diltiazem (CARDIZEM)  infusion Stopped (03/05/12 8144)    Principal Problem:  *Atrial fibrillation with RVR Active Problems:  COPD (chronic obstructive pulmonary disease)  Hypothyroidism  HTN (hypertension)  NSTEMI (non-ST elevated myocardial infarction)  Tachy-brady syndrome    Time spent: [address], [place] [facility] Pager [phone]. If 8PM-8AM, please contact night-coverage at [email or web address], password [facility] 03/05/2012, 10:38 AM  LOS: 1 day

## 2012-03-05 NOTE — Progress Notes (Signed)
Pt converted to normal sinus rhythm at 0233. HR sustaining in 60's. EKG obtained. [facility] paged, Dr. Haroldine Laws ordered for pt to remain on Cardizem drip at 54m/hr. [facility] on-call paged and notified. No new orders. Pt resting comfortably, will continue to monitor.  LEstelle Grumbles RN 03/05/12

## 2012-03-05 NOTE — Progress Notes (Signed)
ANTICOAGULATION CONSULT NOTE - Initial Consult  Pharmacy Consult for Heparin Indication: atrial fibrillation  No Known Allergies  Patient Measurements: Height: _0  (162.6 cm) Weight: 204 lb 6.4 oz (92.715 kg) IBW/kg (Calculated) : 54.7  Heparin Dosing Weight: 75 kg   Vital Signs: Temp: 97.1 F (36.2 C) (11/29 0000) Temp src: Oral (11/29 0000) BP: 107/70 mmHg (11/29 0000) Pulse Rate: 73  (11/29 0000)  Labs:  Basename 03/04/12 2332 03/04/12 1834  HGB 12.9 13.2  HCT 39.2 40.3  PLT 294 303  APTT -- --  LABPROT -- --  INR -- --  HEPARINUNFRC -- --  CREATININE 0.92 0.91  CKTOTAL -- --  CKMB -- --  TROPONINI 0.44* --    Estimated Creatinine Clearance: 66.4 ml/min (by C-G formula based on Cr of 0.92).   Medical History: Past Medical History  Diagnosis Date  . Hypertension   . Hyperlipidemia   . Hypothyroid   . COPD (chronic obstructive pulmonary disease)   . Dizziness     had PT for being off balance - in approx. 2012  . Dysrhythmia     paroxysmal atrial fibrillation  . Lumbar disc disease   . Macular degeneration   . Diastolic dysfunction     Medications:  Prescriptions prior to admission  Medication Sig Dispense Refill  . albuterol (PROVENTIL HFA;VENTOLIN HFA) 108 (90 BASE) MCG/ACT inhaler Inhale 2 puffs into the lungs every 6 (six) hours as needed. Shortness of breath      . aspirin 325 MG EC tablet Take 325 mg by mouth daily.      Marland Kitchen atorvastatin (LIPITOR) 40 MG tablet Take 40 mg by mouth daily.        . Fluticasone-Salmeterol (ADVAIR) 100-50 MCG/DOSE AEPB Inhale 1 puff into the lungs 2 (two) times daily.        . furosemide (LASIX) 20 MG tablet Take 1 tablet (20 mg total) by mouth daily.  30 tablet  11  . levothyroxine (SYNTHROID, LEVOTHROID) 88 MCG tablet Take 88 mcg by mouth daily.        Marland Kitchen lisinopril (PRINIVIL) 20 MG tablet Take 1 tablet (20 mg total) by mouth daily.  30 tablet  11  . metoprolol succinate (TOPROL XL) 25 MG 24 hr tablet Take 1 tablet  (25 mg total) by mouth daily.  30 tablet  11  . nitroGLYCERIN (NITROSTAT) 0.4 MG SL tablet Place 1 tablet (0.4 mg total) under the tongue every 5 (five) minutes as needed for chest pain.  25 tablet  5    Assessment: 66 yo female with Afib for Heparin  Goal of Therapy:  Heparin level 0.3-0.7 units/ml Monitor platelets by anticoagulation protocol: Yes   Plan:  Heparin 3000 units IV bolus, then 1200 units/hr Check heparin level in 8 hours.  Brittie Whisnant, [facility] 03/05/2012,12:58 AM

## 2012-03-05 NOTE — Progress Notes (Signed)
Pt stated that she felt nauseous and began to vomit. Pt HR ranged from 24 - 42 sinus brady, BP 60's/40's. [facility] paged and responded. Dr. Haroldine Laws paged and gave order to stop Cardizem drip. Zofran 70m IV given. Rapid Response RN bolused 250cc NS and pt BP 92/42. [facility] on-call paged and notified, CRogue BussingNP came to assess pt. Pt now resting in bed, with no complaints of nausea. Pt HR sinus brady in the 40's-50's. Will continue to monitor.  LEstelle Grumbles RN 03/05/12

## 2012-03-05 NOTE — Progress Notes (Signed)
Called at [place] by bedside RN regarding patients hr dropping to 24 and vomiting after converting from afib RVR to NSR earlier in the shift. Cardizem drip discounted upon arrival to the unit. Upon assessment patient alert, diaphoretic, vomiting, sitting on the side of the bed, hr 40-50s, sbp 120. zofran given by bedside RN. Cardiology notified. Patient then developed hypotension with sbp in the 60s, hr 40-50, additional episode of emesis. Patient assisted back into the bed and a fluid bolus of 258m NS initiaited per protocol. Attending NP at bedside. Patient closely monitored and frequent vital signs taken, see doc flowsheets. Around 0420 patient states she is feeling better and not longer feels "funny", hr 40-50, bp 92/42 taken by manual bp cuff. Will continue to closely monitor patient, advised bedside rn to call if needed.

## 2012-03-05 NOTE — Progress Notes (Addendum)
ANTICOAGULATION CONSULT NOTE - Follow Up Consult  Pharmacy Consult for Heparin Indication: chest pain/ACS and atrial fibrillation  No Known Allergies  Patient Measurements: Height: 5' 4" (162.6 cm) Weight: 204 lb 6.4 oz (92.715 kg) IBW/kg (Calculated) : 54.7  Heparin Dosing Weight: 72 kg  Vital Signs: Temp: 97.7 F (36.5 C) (11/29 1300) BP: 91/59 mmHg (11/29 1300) Pulse Rate: 58  (11/29 1300)  Labs:  Basename 03/05/12 1650 03/05/12 1038 03/05/12 0420 03/04/12 2332 03/04/12 1834  HGB -- 11.3* 11.5* -- --  HCT -- 34.8* 35.4* 39.2 --  PLT -- 267 269 294 --  APTT -- -- -- -- --  LABPROT -- -- -- -- --  INR -- -- -- -- --  HEPARINUNFRC 0.83* 0.65 -- -- --  CREATININE -- -- 1.11* 0.92 0.91  CKTOTAL -- -- -- -- --  CKMB -- -- -- -- --  TROPONINI -- 1.90* 0.97* 0.44* --    Estimated Creatinine Clearance: 55 ml/min (by C-G formula based on Cr of 1.11).   Medications:  Infusions:     . heparin 1,200 Units/hr (03/05/12 0210)  . [DISCONTINUED] diltiazem (CARDIZEM) infusion Stopped (03/05/12 7158)    Assessment: Atrial Fibrillation, ACS:  66 year old female admitted with atrial fibrillation and with positive cardiac enzymes currently on full anticoagulation with Heparin.  Her initial Heparin level is therapeutic, repeat level now above goal.  No bleeding or complications noted per RN.  Plans noted for conversion to Xarelto prior to discharge.  Goal of Therapy:  Heparin level 0.3-0.7 units/ml Monitor platelets by anticoagulation protocol: Yes   Plan:  1. Decrease IV heparin to 1050 units/hr. 2. Check heparin level 6 hrs after rate decreased. 3. Daily heparin level and CBC.  [place] Crane, Pharm D 03/05/2012 5:52 PM

## 2012-03-05 NOTE — Progress Notes (Signed)
Spoke with Dr. Caryl Comes. Given Qtc of 488m and apical hypertrophy (seen on ECHO), will start amiodarone 4063mPO oral load watching for any further evidence of significant bradycardia. She will likely remain here over weekend.   If bradycardiac worsens, pacer.   Tomorrow will likely start Xarelto. Will give heparin IV for now.

## 2012-03-05 NOTE — Progress Notes (Signed)
  Echocardiogram 2D Echocardiogram has been performed.  Kelan Pritt, [facility] 03/05/2012, 11:41 AM

## 2012-03-05 NOTE — Progress Notes (Signed)
Pt heart rate uncontrolled 110-140's in afib. [facility] on-call paged, [facility] requested to page Cardiology. [place] on-call paged, Dr. Haroldine Laws ordered 67m/hr Cardizem drip. Will start drip and continue to monitor.  LEstelle Grumbles RN 03/05/12

## 2012-03-05 NOTE — Consult Note (Addendum)
ELECTROPHYSIOLOGY CONSULT NOTE    Patient ID: Amy [name] MRN: [id number], DOB/AGE: [birth year] 66 y.o.  Admit date: 03/04/2012 Date of Consult: 03-05-2012  Primary Physician: [name], MD Primary Cardiologist: Fransico Him, MD  Reason for Consultation: atrial arrhythmias  HPI:  [name] is a 66 year old female with recurrent atrial arrhythmias.  EP has been asked to evaluate for treatment options.  Her past medical history is significant for hypertension, hyperlipidemia, COPD, dizziness (underwent PT for ?vertigo).  She reports undergoing a full pulmonary work up for shortness of breath in the past; however, does not recall ever having a sleep study.  She is not sure if she snores.   In June of this year, she developed palpitations, presented to the ER and was found to have afib with RVR.  She was treated with Cardizem and converted.  She also has had progressive dyspnea on exertion and underwent catheterization in July which demonstrated an EF of 60% with a D2 lesion not amenable to PCI.  Since that time, she reports that she has not had any further palpitations.  She was placed on Toprol for medical therapy of her coronary disease and afib.  She reports significant exercise intolerance and shortness of breath with ADL's.  She was at the movies yesterday and developed chest pain followed by tachy palpitations.  She took at NTG which did not resolve her symptoms and she called 911.  Upon arrival to ER, she was in atrial flutter at a rate of 140.  She was given Flecainide in combination with Diltiazem gtt which converted her to SB.  She developed bradycardia with rates in the 20's-40's associated with nausea and her Diltiazem was discontinued.  She denies chest pain aside from when associated with palpitations.    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  She is engaged to be married in January.   Her catheterization was described as a spade shaped  ventricle (see below)  ROS is negative except as outlined above.      Past Medical History  Diagnosis Date  . Hypertension   . Hyperlipidemia   . Hypothyroid   . COPD (chronic obstructive pulmonary disease)   . Dizziness     had PT for being off balance - in approx. 2012  . Dysrhythmia     paroxysmal atrial fibrillation  . Lumbar disc disease   . Macular degeneration   . Diastolic dysfunction      Surgical History:  Past Surgical History  Procedure Date  . Cesarean section 1983     Prescriptions prior to admission  Medication Sig Dispense Refill  . albuterol (PROVENTIL HFA;VENTOLIN HFA) 108 (90 BASE) MCG/ACT inhaler Inhale 2 puffs into the lungs every 6 (six) hours as needed. Shortness of breath      . aspirin 325 MG EC tablet Take 325 mg by mouth daily.      Marland Kitchen atorvastatin (LIPITOR) 40 MG tablet Take 40 mg by mouth daily.        . Fluticasone-Salmeterol (ADVAIR) 100-50 MCG/DOSE AEPB Inhale 1 puff into the lungs 2 (two) times daily.        . furosemide (LASIX) 20 MG tablet Take 1 tablet (20 mg total) by mouth daily.  30 tablet  11  . levothyroxine (SYNTHROID, LEVOTHROID) 88 MCG tablet Take 88 mcg by mouth daily.        Marland Kitchen lisinopril (PRINIVIL) 20 MG tablet Take 1 tablet (20 mg total) by mouth daily.  30 tablet  11  .  metoprolol succinate (TOPROL XL) 25 MG 24 hr tablet Take 1 tablet (25 mg total) by mouth daily.  30 tablet  11  . nitroGLYCERIN (NITROSTAT) 0.4 MG SL tablet Place 1 tablet (0.4 mg total) under the tongue every 5 (five) minutes as needed for chest pain.  25 tablet  5    Inpatient Medications:    . aspirin EC  325 mg Oral Daily  . atorvastatin  40 mg Oral q1800  . levothyroxine  88 mcg Oral QAC breakfast  . lisinopril  20 mg Oral Daily  . mometasone-formoterol  2 puff Inhalation BID  . sodium chloride  3 mL Intravenous Q12H    Allergies: No Known Allergies  History   Social History  . Marital Status: Divorced    Spouse Name: divorced.    Number of  Children: 1  . Years of Education: N/A   Occupational History  . retired. still works for [employer].     Social History Main Topics  . Smoking status: Former Smoker -- 0.3 packs/day for 44 years    Types: Cigarettes    Quit date: 04/07/2009  . Smokeless tobacco: Never Used  . Alcohol Use: Yes     Comment: 2 to 3 glasses wine per week  . Drug Use: No  . Sexually Active: Not on file   Other Topics Concern  . Not on file   Social History Narrative   Pt lives alone with 2 dogs.      Family History  Problem Relation Age of Onset  . Heart disease Sister     Good Pastures Syndrome  . Gout Father   . Lung cancer Father   . Breast cancer Paternal Aunt     BP 91/59  Pulse 58  Temp 97.7 F (36.5 C) (Oral)  Resp 17  Ht _0  (1.626 m)  Wt 204 lb 6.4 oz (92.715 kg)  BMI 35.09 kg/m2  SpO2 95% Alert and oriented in no acute distress HENT- normal Eyes- EOMI, without scleral icterus Skin- warm and dry; without rashes LN-neg Neck- supple without thyromegaly, JVP 8-10  carotids brisk and full without bruits Back-without CVAT or kyphosis Lungs-clear to auscultation CV-Regular rate and rhythm, nl S1 and S2, no murmurs gallops or rubs, S4-present Abd-soft with active bowel sounds; no midline pulsation or hepatomegaly Pulses-intact femoral and distal MKS-without gross deformity Neuro- Ax O, CN3-12 intact, grossly normal motor and sensory function Affect engaging   Labs:   Lab Results  Component Value Date   WBC 8.0 03/05/2012   HGB 11.5* 03/05/2012   HCT 35.4* 03/05/2012   MCV 89.6 03/05/2012   PLT 269 03/05/2012    Lab 03/05/12 0420  NA 140  K 3.8  CL 109  CO2 21  BUN 11  CREATININE 1.11*  CALCIUM 7.8*  PROT 6.3  BILITOT 0.2*  ALKPHOS 69  ALT 10  AST 17  GLUCOSE 130*   Lab Results  Component Value Date   TROPONINI 0.97* 03/05/2012     Radiology/Studies:   June 2013- CXR- Low volume exam with vascular prominence and basilar atelectasis   EKG: sinus  rhythm, rate 60 QTc 471mec; striking T-wave inversions with ST segment depression consistent with apical hypertrophic cardiomyopathy  TELEMETRY: afib/flutter with RVR followed by SB, rates 50's as well as rates in the 30s   Patient Active Hospital Problem List: Paroxysmal atrial fibrillation/flutter ()   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 Echocardiogram today is consistent with hypothesis diagnosis of apical hypertrophic cardiomyopathy based on the observation of the catheterization and the abnormal echocardiogram.  This explains in part why her atrial fibrillation is so symptomatic. Her hypertrophy also however, limits antiarrhythmic drug options as does her QT prolongation to amiodarone. She is relatively young, but there are no other good options. Furthermore, her bradycardia is likely going to be a problem and will almost certainly result in the need for backup bradycardia pacing.  Diuretics may be helpful. Disopyramide has been used in this cohort also however, with her QT prolongation it is not a good choice.  Catheter ablation may be of benefit; many centers are not doing it because of high risk of recurrence. Dr. Romeo Apple at [facility] using a conversion procedure has been more successful with this with the patient's.  Other issues related to the diagnosis includes screening of her daughter as well as her siblings. 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  Risk stratification this 66 year old lady is not necessary at the data does not apply this age population.

## 2012-03-05 NOTE — Progress Notes (Signed)
66 year old female with normal ejection fraction, mild LVH, mild CAD, diagonal branch not amenable to PCI with symptomatic atrial fibrillation/atypical flutter.  Reviewed hospital records. Reviewed Dr. Theodosia Blender notes. Her EKG abnormality/T-wave inversion is long-standing. Nuclear stress test in the past showed no ischemia most recently earlier this year. Cardiac catheterization as above showed a diagonal stenosis but small caliber vessel. Overall EF is normal.  She states that she does have occasional diaphoresis at home does not seem to be clearly associated with palpitations. She states that they are different from "hot flashes ".  Earlier this morning she converted and developed sinus bradycardia. Hypotension as well. She is currently being treated with IV bolus. Lisinopril is being held. Troponin was mildly elevated likely secondary to demand ischemia in the setting of rapid atrial fibrillation.  Currently agree with Dr. Haroldine Laws that this is a challenging situation. Ultimately, she has symptomatic atrial arrhythmia, atrial fibrillation/flutter, mild LVH, mild CAD, normal EF. Dofetilide may be the right choice for her and I have consulted Dr. Jolyn Nap with electrophysiology to aid in this discussion. For now, we will continue with IV heparin over the next 24 hours given her mild elevation in troponin. Hopeful conversion to Xarelto tomorrow. I briefly discussed with her the possibility of pacemaker implantation if we are unsuccessful at treating tachycardia arrhythmia without significant bradycardia.

## 2012-03-05 NOTE — Progress Notes (Signed)
CRITICAL VALUE ALERT  Critical value received:  troponin  Date of notification:  03/05/2012   Time of notification:  12:03  Critical value read back:yes  Nurse who received alert:  Lina Sar   MD notified (1st page):  Dr. Eliseo Squires  Time of first page:  12:03  MD notified (2nd page): Dr. Marlou Porch  Time of second page: 12:15  Responding MD:  Dr. Marlou Porch  Time MD responded: 12:21

## 2012-03-05 NOTE — ED Provider Notes (Signed)
I saw and evaluated the patient, reviewed the resident's note and I agree with the findings and plan.  Varney Biles, MD 03/05/12 0111

## 2012-03-05 NOTE — Progress Notes (Signed)
ANTICOAGULATION CONSULT NOTE - Follow Up Consult  Pharmacy Consult for Heparin Indication: chest pain/ACS and atrial fibrillation  No Known Allergies  Patient Measurements: Height: _0  (162.6 cm) Weight: 204 lb 6.4 oz (92.715 kg) IBW/kg (Calculated) : 54.7  Heparin Dosing Weight: 72 kg  Vital Signs: Temp: 98.4 F (36.9 C) (11/29 0350) Temp src: Oral (11/29 0350) BP: 88/40 mmHg (11/29 1148) Pulse Rate: 58  (11/29 1002)  Labs:  Basename 03/05/12 1038 03/05/12 0420 03/04/12 2332 03/04/12 1834  HGB 11.3* 11.5* -- --  HCT 34.8* 35.4* 39.2 --  PLT 267 269 294 --  APTT -- -- -- --  LABPROT -- -- -- --  INR -- -- -- --  HEPARINUNFRC 0.65 -- -- --  CREATININE -- 1.11* 0.92 0.91  CKTOTAL -- -- -- --  CKMB -- -- -- --  TROPONINI 1.90* 0.97* 0.44* --    Estimated Creatinine Clearance: 55 ml/min (by C-G formula based on Cr of 1.11).   Medications:  Infusions:    . heparin 1,200 Units/hr (03/05/12 0210)  . [DISCONTINUED] diltiazem (CARDIZEM) infusion Stopped (03/05/12 1683)    Assessment: Atrial Fibrillation, ACS:  66 year old female admitted with atrial fibrillation and with positive cardiac enzymes currently on full anticoagulation with Heparin.  Her initial Heparin level is therapeutic.  Plans noted for conversion to Xarelto prior to discharge.  Goal of Therapy:  Heparin level 0.3-0.7 units/ml Monitor platelets by anticoagulation protocol: Yes   Plan:  Continue Heparin at 1200 units/hr Recheck Heparin level in 6 hours to confirm  Legrand Como, Pharm.D., BCPS Clinical Pharmacist  Phone [phone] Pager [phone] 03/05/2012, 12:38 PM

## 2012-03-05 NOTE — Progress Notes (Signed)
CRITICAL VALUE ALERT  Critical value received:  Troponin 0.44 ng/mL  Date of notification:  03/05/12  Time of notification:  0030  Critical value read back:yes  Nurse who received alert:  Estelle Grumbles, RN  MD notified (1st page):  [facility] on-call, Rogue Bussing.  Time of first page:  0040  Responding MD:  [facility] on-call, Rogue Bussing, requested to page Dr. Haroldine Laws with the troponin value. [place] on-call paged, and Dr. Haroldine Laws gave order to start pt on heparin drip per pharmacy.  Time MD responded:  [id number]

## 2012-03-06 DIAGNOSIS — I4891 Unspecified atrial fibrillation: Secondary | ICD-10-CM | POA: Diagnosis not present

## 2012-03-06 DIAGNOSIS — I214 Non-ST elevation (NSTEMI) myocardial infarction: Secondary | ICD-10-CM | POA: Diagnosis not present

## 2012-03-06 DIAGNOSIS — R0989 Other specified symptoms and signs involving the circulatory and respiratory systems: Secondary | ICD-10-CM

## 2012-03-06 DIAGNOSIS — I498 Other specified cardiac arrhythmias: Secondary | ICD-10-CM | POA: Diagnosis not present

## 2012-03-06 DIAGNOSIS — I5032 Chronic diastolic (congestive) heart failure: Secondary | ICD-10-CM | POA: Diagnosis not present

## 2012-03-06 DIAGNOSIS — E039 Hypothyroidism, unspecified: Secondary | ICD-10-CM | POA: Diagnosis not present

## 2012-03-06 DIAGNOSIS — I428 Other cardiomyopathies: Secondary | ICD-10-CM | POA: Diagnosis not present

## 2012-03-06 LAB — CBC
HCT: 34.5 % — ABNORMAL LOW (ref 36.0–46.0)
Hemoglobin: 11.3 g/dL — ABNORMAL LOW (ref 12.0–15.0)
MCH: 29.8 pg (ref 26.0–34.0)
MCV: 91 fL (ref 78.0–100.0)
Platelets: 264 10*3/uL (ref 150–400)
RBC: 3.79 MIL/uL — ABNORMAL LOW (ref 3.87–5.11)
WBC: 8.7 10*3/uL (ref 4.0–10.5)

## 2012-03-06 MED ORDER — FUROSEMIDE 10 MG/ML IJ SOLN
INTRAMUSCULAR | Status: AC
  Filled 2012-03-06: qty 4

## 2012-03-06 MED ORDER — DOCUSATE SODIUM 100 MG PO CAPS
100.0000 mg | ORAL_CAPSULE | Freq: Every day | ORAL | Status: DC | PRN
  Administered 2012-03-07 (×2): 100 mg via ORAL
  Filled 2012-03-06 (×2): qty 1

## 2012-03-06 MED ORDER — RIVAROXABAN 20 MG PO TABS
20.0000 mg | ORAL_TABLET | Freq: Every day | ORAL | Status: DC
  Filled 2012-03-06: qty 1

## 2012-03-06 MED ORDER — FUROSEMIDE 10 MG/ML IJ SOLN
20.0000 mg | Freq: Once | INTRAMUSCULAR | Status: AC
  Administered 2012-03-06: 20 mg via INTRAVENOUS

## 2012-03-06 MED ORDER — MOMETASONE FURO-FORMOTEROL FUM 100-5 MCG/ACT IN AERO
2.0000 | INHALATION_SPRAY | Freq: Two times a day (BID) | RESPIRATORY_TRACT | Status: DC
  Administered 2012-03-06 – 2012-03-08 (×4): 2 via RESPIRATORY_TRACT
  Filled 2012-03-06: qty 8.8

## 2012-03-06 MED ORDER — ASPIRIN EC 81 MG PO TBEC
81.0000 mg | DELAYED_RELEASE_TABLET | Freq: Every day | ORAL | Status: DC
  Administered 2012-03-07 – 2012-03-08 (×2): 81 mg via ORAL
  Filled 2012-03-06 (×3): qty 1

## 2012-03-06 MED ORDER — RIVAROXABAN 20 MG PO TABS
20.0000 mg | ORAL_TABLET | Freq: Every day | ORAL | Status: DC
  Administered 2012-03-06 – 2012-03-07 (×2): 20 mg via ORAL
  Filled 2012-03-06 (×3): qty 1

## 2012-03-06 NOTE — Progress Notes (Signed)
ANTICOAGULATION CONSULT NOTE - Follow Up Consult  Pharmacy Consult for Heparin Indication: chest pain/ACS and atrial fibrillation  No Known Allergies  Patient Measurements: Height: _0  (162.6 cm) Weight: 204 lb 6.4 oz (92.715 kg) IBW/kg (Calculated) : 54.7  Heparin Dosing Weight: 72 kg  Vital Signs: Temp: 98 F (36.7 C) (11/29 2053) Temp src: Oral (11/29 2053) BP: 104/68 mmHg (11/29 2055) Pulse Rate: 63  (11/29 2053)  Labs:  Basename 03/05/12 2334 03/05/12 1650 03/05/12 1038 03/05/12 0420 03/04/12 2332 03/04/12 1834  HGB -- -- 11.3* 11.5* -- --  HCT -- -- 34.8* 35.4* 39.2 --  PLT -- -- 267 269 294 --  APTT -- -- -- -- -- --  LABPROT -- -- -- -- -- --  INR -- -- -- -- -- --  HEPARINUNFRC 0.44 0.83* 0.65 -- -- --  CREATININE -- -- -- 1.11* 0.92 0.91  CKTOTAL -- -- -- -- -- --  CKMB -- -- -- -- -- --  TROPONINI -- -- 1.90* 0.97* 0.44* --    Estimated Creatinine Clearance: 55 ml/min (by C-G formula based on Cr of 1.11).   Medications:  Infusions:     . heparin 1,050 Units/hr (03/05/12 2048)  . [DISCONTINUED] diltiazem (CARDIZEM) infusion Stopped (03/05/12 0339)  . [DISCONTINUED] heparin 1,200 Units/hr (03/05/12 0210)    Assessment: Atrial Fibrillation, ACS:  66 year old female admitted with atrial fibrillation and with positive cardiac enzymes currently on full anticoagulation with Heparin. Heparin level (0.44) is at-goal on 1050 units/hr. Plans noted for conversion to Xarelto prior to discharge.  Goal of Therapy:  Heparin level 0.3-0.7 units/ml Monitor platelets by anticoagulation protocol: Yes   Plan:  1. Continue IV heparin to 1050 units/hr. 2. Daily heparin level and CBC.  Raye Sorrow, Pharm D 03/06/2012 12:22 AM

## 2012-03-06 NOTE — Progress Notes (Signed)
[facility] PROGRESS NOTE  Amy [name] LGX:[id number] DOB: [birth year] DOA: 03/04/2012 PCP: [name], MD  Assessment/Plan: #1. A. fib with RVR - Closely monitor in telemetry. Converted back to sinus Appreciate cardiology/EPs help.  Heparin gtt change to xaralto today.  amio started yesterday #2. Chest pain - presently resolved. Patient had a cardiac cath recently in July which was unremarkable. trend cardiac markers.   #3. COPD - presently not wheezing. Continue inhalers.  #4. Hypothyroidism - continue Synthroid.  TSH high- on synthroid, patient has missed several doses of her synthroid, will recheck labs in 4-6 wks as an outpatient, free t4 ok #5. AKI- monitor in AM, giving IVF   Code Status: full Family Communication: patient at bedside Disposition Plan: home when better   Consultants:  cardiology  HPI/Subjective: Feeling better today, still fatigued   Objective: Filed Vitals:   03/05/12 2053 03/05/12 2055 03/06/12 0457 03/06/12 0519  BP:  104/68  110/68  Pulse: 63   57  Temp: 98 F (36.7 C)     TempSrc: Oral     Resp: 18     Height:      Weight:   94 kg (207 lb 3.7 oz)   SpO2: 97%   96%    Intake/Output Summary (Last 24 hours) at 03/06/12 1025 Last data filed at 03/06/12 0955  Gross per 24 hour  Intake   1038 ml  Output    900 ml  Net    138 ml   Filed Weights   03/05/12 0000 03/06/12 0457  Weight: 92.715 kg (204 lb 6.4 oz) 94 kg (207 lb 3.7 oz)    Exam:   General:  A+Ox3, NAD  Cardiovascular: rrr  Respiratory: clear anterior, no wheezing  Abdomen: +BS, soft, NT/ND  Data Reviewed: Basic Metabolic Panel:  Lab 81/44/81 0420 03/04/12 2332 03/04/12 1834  NA 140 -- 141  K 3.8 -- 3.5  CL 109 -- 106  CO2 21 -- 26  GLUCOSE 130* -- 83  BUN 11 -- 11  CREATININE 1.11* 0.92 0.91  CALCIUM 7.8* -- 8.8  MG -- -- --  PHOS -- -- --   Liver Function Tests:  Lab 03/05/12 0420  AST 17  ALT 10  ALKPHOS 69  BILITOT 0.2*  PROT 6.3   ALBUMIN 3.2*   No results found for this basename: LIPASE:5,AMYLASE:5 in the last 168 hours No results found for this basename: AMMONIA:5 in the last 168 hours CBC:  Lab 03/06/12 0505 03/05/12 1038 03/05/12 0420 03/04/12 2332 03/04/12 1834  WBC 8.7 9.6 8.0 9.2 8.0  NEUTROABS -- -- 5.1 -- --  HGB 11.3* 11.3* 11.5* 12.9 13.2  HCT 34.5* 34.8* 35.4* 39.2 40.3  MCV 91.0 89.7 89.6 88.5 88.4  PLT 264 267 269 294 303   Cardiac Enzymes:  Lab 03/06/12 0835 03/05/12 1038 03/05/12 0420 03/04/12 2332  CKTOTAL -- -- -- --  CKMB -- -- -- --  CKMBINDEX -- -- -- --  TROPONINI 0.98* 1.90* 0.97* 0.44*   BNP (last 3 results) No results found for this basename: PROBNP:3 in the last 8760 hours CBG: No results found for this basename: GLUCAP:5 in the last 168 hours  No results found for this or any previous visit (from the past 240 hour(s)).   Studies: No results found.  Scheduled Meds:    . amiodarone  400 mg Oral BID  . aspirin EC  325 mg Oral Daily  . atorvastatin  40 mg Oral q1800  . levothyroxine  88 mcg  Oral QAC breakfast  . mometasone-formoterol  2 puff Inhalation BID  . [COMPLETED] sodium chloride  500 mL Intravenous Once  . sodium chloride  3 mL Intravenous Q12H  . sodium chloride  3 mL Intravenous Q12H  . [DISCONTINUED] mometasone-formoterol  2 puff Inhalation BID  . [DISCONTINUED] rivaroxaban  20 mg Oral Daily   Continuous Infusions:    . heparin 1,050 Units/hr (03/05/12 2048)  . [DISCONTINUED] heparin 1,200 Units/hr (03/05/12 0210)    Principal Problem:  *Paroxysmal atrial fibrillation/flutter Active Problems:  HTN (hypertension)  NSTEMI (non-ST elevated myocardial infarction)  Apical variant hypertrophic cardiomyopathy  Chronic diastolic heart failure  Sinus bradycardia    Time spent: [place] Pager [phone]. If 8PM-8AM, please contact night-coverage at [email or web address], password [facility] 03/06/2012, 10:25 AM  LOS: 2 days

## 2012-03-06 NOTE — Progress Notes (Signed)
ANTICOAGULATION CONSULT NOTE - Follow Up Consult  Pharmacy Consult for Heparin Indication: chest pain/ACS and atrial fibrillation  No Known Allergies  Labs:  Basename 03/06/12 0505 03/05/12 2334 03/05/12 1650 03/05/12 1038 03/05/12 0420 03/04/12 2332 03/04/12 1834  HGB 11.3* -- -- 11.3* -- -- --  HCT 34.5* -- -- 34.8* 35.4* -- --  PLT 264 -- -- 267 269 -- --  APTT -- -- -- -- -- -- --  LABPROT -- -- -- -- -- -- --  INR -- -- -- -- -- -- --  HEPARINUNFRC 0.47 0.44 0.83* -- -- -- --  CREATININE -- -- -- -- 1.11* 0.92 0.91  CKTOTAL -- -- -- -- -- -- --  CKMB -- -- -- -- -- -- --  TROPONINI -- -- -- 1.90* 0.97* 0.44* --    Estimated Creatinine Clearance: 55.4 ml/min (by C-G formula based on Cr of 1.11).   Assessment: Atrial Fibrillation, ACS:  66 year old female admitted with atrial fibrillation and with positive cardiac enzymes currently on full anticoagulation with Heparin. Heparin level (0.47) is at-goal on 1050 units/hr. Plans noted for conversion to Xarelto prior to discharge.  Goal of Therapy:  Heparin level 0.3-0.7 units/ml Monitor platelets by anticoagulation protocol: Yes   Plan:  1. Continue IV heparin to 1050 units/hr. 2. Daily heparin level and CBC.  Thank you. Anette Guarneri, PharmD [phone]

## 2012-03-06 NOTE — Progress Notes (Signed)
Noted crackles from the right  midlung area to its base. Pt does c/o of wheezing earlier. SOBE only when she gets up. Noted none productive  coughing episodes while assessing pt. Will refer to MD accordingly. Thanks ! Carlynn Spry RN

## 2012-03-06 NOTE — Progress Notes (Signed)
Subjective:  Feels a little tired and mild SOB. No CP. No dizziness.    Objective:  Vital Signs in the last 24 hours: Temp:  [97.7 F (36.5 C)-98 F (36.7 C)] 98 F (36.7 C) (11/29 2053) Pulse Rate:  [57-63] 57  (11/30 0519) Resp:  [17-18] 18  (11/29 2053) BP: (74-110)/(40-68) 110/68 mmHg (11/30 0519) SpO2:  [89 %-97 %] 96 % (11/30 0519) Weight:  [94 kg (207 lb 3.7 oz)] 94 kg (207 lb 3.7 oz) (11/30 0457)  Intake/Output from previous day: 11/29 0701 - 11/30 0700 In: 7001 [P.O.:720; I.V.:178] Out: -    Physical Exam: General: Well developed, well nourished, in no acute distress. Head:  Normocephalic and atraumatic. Lungs: Clear to auscultation and percussion. Heart: Normal S1 and S2.  Soft systolic murmur LLSB, no rubs or gallops.  Abdomen: soft, non-tender, positive bowel sounds. Obese Extremities: No clubbing or cyanosis. No edema. Neurologic: Alert and oriented x 3.    Lab Results:  Basename 03/06/12 0505 03/05/12 1038  WBC 8.7 9.6  HGB 11.3* 11.3*  PLT 264 267    Basename 03/05/12 0420 03/04/12 2332 03/04/12 1834  NA 140 -- 141  K 3.8 -- 3.5  CL 109 -- 106  CO2 21 -- 26  GLUCOSE 130* -- 83  BUN 11 -- 11  CREATININE 1.11* 0.92 --    Basename 03/05/12 1038 03/05/12 0420  TROPONINI 1.90* 0.97*   Hepatic Function Panel  Basename 03/05/12 0420  PROT 6.3  ALBUMIN 3.2*  AST 17  ALT 10  ALKPHOS 69  BILITOT 0.2*  BILIDIR --  IBILI --     Telemetry: Sinus brady 50's rare upper 40's. Rare PAC's. No pauses. No further AFIB Personally viewed.   EKG:  T wave changes consistent with Apical hypertrophy. QTc 463m.   Cardiac Studies:  EF 65% apical hypertrophy, MAC, mild MR.  - Left ventricle: The cavity size was normal. There was mild concentric hypertrophy with associated apical thickening/hypertrophy giving the appearance of apical predominant hypertrophic cardiomyopathy. Systolic function was vigorous. The estimated ejection fraction was in the range  of 65% to 70%. Although no diagnostic regional wall motion abnormality was identified, this possibility cannot be completely excluded on the basis of this study. Doppler parameters are consistent with high ventricular filling pressure. - Mitral valve: Moderately calcified annulus. Mild regurgitation. - Left atrium: The atrium was mildly to moderately dilated.   Assessment/Plan:  Principal Problem:  *Paroxysmal atrial fibrillation/flutter Active Problems:  HTN (hypertension)  NSTEMI (non-ST elevated myocardial infarction)  Apical variant hypertrophic cardiomyopathy  Chronic diastolic heart failure  Sinus bradycardia  -Amiodarone 4035mBID. Monitor on tele. BrLoletha Grayeroted but not severe. May need pacer ulitmatly. Discussed. TSH elevated 17 but free T4 normal. Will check ECG in AM.   -Apical hypertrophy variant. Discussed with patient. Appreciate Dr. KlOlin Pianput. Will not tolerate rapid AFIB well. AMIO. Stopped toprol because of bradycardia.   -Hypotension - improved with IVF.   -NSTEMI - type 2 likely from demand ischemia. Recent cath 7/13 with small diag not amenable to PCI. If Troponin trending down will DC heparin IV and start Xarelto 2080mAFIB)  -Mild anemia - monitor.    Amy [name], MARMount Briar/30/2013, 8:38 AM

## 2012-03-07 DIAGNOSIS — I428 Other cardiomyopathies: Secondary | ICD-10-CM | POA: Diagnosis not present

## 2012-03-07 DIAGNOSIS — E039 Hypothyroidism, unspecified: Secondary | ICD-10-CM | POA: Diagnosis not present

## 2012-03-07 DIAGNOSIS — I4891 Unspecified atrial fibrillation: Secondary | ICD-10-CM | POA: Diagnosis not present

## 2012-03-07 DIAGNOSIS — I10 Essential (primary) hypertension: Secondary | ICD-10-CM | POA: Diagnosis not present

## 2012-03-07 DIAGNOSIS — I498 Other specified cardiac arrhythmias: Secondary | ICD-10-CM | POA: Diagnosis not present

## 2012-03-07 DIAGNOSIS — I214 Non-ST elevation (NSTEMI) myocardial infarction: Secondary | ICD-10-CM | POA: Diagnosis not present

## 2012-03-07 DIAGNOSIS — I5032 Chronic diastolic (congestive) heart failure: Secondary | ICD-10-CM | POA: Diagnosis not present

## 2012-03-07 DIAGNOSIS — J449 Chronic obstructive pulmonary disease, unspecified: Secondary | ICD-10-CM | POA: Diagnosis not present

## 2012-03-07 LAB — BASIC METABOLIC PANEL
BUN: 21 mg/dL (ref 6–23)
Chloride: 106 mEq/L (ref 96–112)
GFR calc non Af Amer: 40 mL/min — ABNORMAL LOW (ref >90)
Glucose, Bld: 86 mg/dL (ref 70–99)
Potassium: 4.2 mEq/L (ref 3.5–5.1)
Sodium: 139 mEq/L (ref 135–145)

## 2012-03-07 LAB — CBC
HCT: 34.2 % — ABNORMAL LOW (ref 36.0–46.0)
Hemoglobin: 11 g/dL — ABNORMAL LOW (ref 12.0–15.0)
MCH: 28.8 pg (ref 26.0–34.0)
RBC: 3.82 MIL/uL — ABNORMAL LOW (ref 3.87–5.11)

## 2012-03-07 NOTE — Progress Notes (Signed)
[facility] PROGRESS NOTE  Amy [name] VQQ:[id number] DOB: December 16, 1945 DOA: 03/04/2012 PCP: [name], MD  Assessment/Plan: #1. A. fib with RVR - Closely monitor in telemetry. Converted back to sinus Appreciate cardiology/EPs help.  Heparin gtt change to xaralto today.  amio started yesterday- plan per cardiology- patient feeling better #2. Chest pain - presently resolved. Patient had a cardiac cath recently in July which was unremarkable. trend cardiac markers.   #3. COPD - presently not wheezing. Continue inhalers.  #4. Hypothyroidism - continue Synthroid.  TSH high- on synthroid, patient has missed several doses of her synthroid, will recheck labs in 4-6 wks as an outpatient, free t4 ok #5. AKI- baseline around 1- monitor in AM- had to give lasix for crackles yesterday and patient diuresed, encourage PO intake   Code Status: full Family Communication: patient at bedside Disposition Plan: home when better   Consultants:  cardiology  HPI/Subjective: Feeling well, says had some wheezing yesterday- off O2 today   Objective: Filed Vitals:   03/06/12 2037 03/07/12 0416 03/07/12 0832 03/07/12 1007  BP: 106/72 120/75  147/74  Pulse: 60 65  58  Temp: 98.3 F (36.8 C) 98.4 F (36.9 C)    TempSrc: Oral Oral    Resp: 19 19    Height:      Weight:  94.62 kg (208 lb 9.6 oz)    SpO2: 92% 95% 96%     Intake/Output Summary (Last 24 hours) at 03/07/12 1036 Last data filed at 03/07/12 0300  Gross per 24 hour  Intake    360 ml  Output   2550 ml  Net  -2190 ml   Filed Weights   03/05/12 0000 03/06/12 0457 03/07/12 0416  Weight: 92.715 kg (204 lb 6.4 oz) 94 kg (207 lb 3.7 oz) 94.62 kg (208 lb 9.6 oz)    Exam:   General:  A+Ox3, NAD  Cardiovascular: rrr  Respiratory: clear anterior, no wheezing  Abdomen: +BS, soft, NT/ND  Data Reviewed: Basic Metabolic Panel:  Lab 43/32/95 0610 03/05/12 0420 03/04/12 2332 03/04/12 1834  NA 139 140 -- 141  K 4.2  3.8 -- 3.5  CL 106 109 -- 106  CO2 24 21 -- 26  GLUCOSE 86 130* -- 83  BUN 21 11 -- 11  CREATININE 1.35* 1.11* 0.92 0.91  CALCIUM 8.9 7.8* -- 8.8  MG -- -- -- --  PHOS -- -- -- --   Liver Function Tests:  Lab 03/05/12 0420  AST 17  ALT 10  ALKPHOS 69  BILITOT 0.2*  PROT 6.3  ALBUMIN 3.2*   No results found for this basename: LIPASE:5,AMYLASE:5 in the last 168 hours No results found for this basename: AMMONIA:5 in the last 168 hours CBC:  Lab 03/07/12 0610 03/06/12 0505 03/05/12 1038 03/05/12 0420 03/04/12 2332  WBC 8.7 8.7 9.6 8.0 9.2  NEUTROABS -- -- -- 5.1 --  HGB 11.0* 11.3* 11.3* 11.5* 12.9  HCT 34.2* 34.5* 34.8* 35.4* 39.2  MCV 89.5 91.0 89.7 89.6 88.5  PLT 279 264 267 269 294   Cardiac Enzymes:  Lab 03/06/12 0835 03/05/12 1038 03/05/12 0420 03/04/12 2332  CKTOTAL -- -- -- --  CKMB -- -- -- --  CKMBINDEX -- -- -- --  TROPONINI 0.98* 1.90* 0.97* 0.44*   BNP (last 3 results) No results found for this basename: PROBNP:3 in the last 8760 hours CBG: No results found for this basename: GLUCAP:5 in the last 168 hours  No results found for this or any previous visit (from  the past 240 hour(s)).   Studies: No results found.  Scheduled Meds:    . amiodarone  400 mg Oral BID  . aspirin EC  81 mg Oral Daily  . atorvastatin  40 mg Oral q1800  . furosemide      . [COMPLETED] furosemide  20 mg Intravenous Once  . levothyroxine  88 mcg Oral QAC breakfast  . mometasone-formoterol  2 puff Inhalation BID  . rivaroxaban  20 mg Oral Q supper  . sodium chloride  3 mL Intravenous Q12H  . sodium chloride  3 mL Intravenous Q12H  . [DISCONTINUED] aspirin EC  325 mg Oral Daily  . [DISCONTINUED] rivaroxaban  20 mg Oral Daily   Continuous Infusions:    Principal Problem:  *Paroxysmal atrial fibrillation/flutter Active Problems:  HTN (hypertension)  NSTEMI (non-ST elevated myocardial infarction)  Apical variant hypertrophic cardiomyopathy  Chronic diastolic heart  failure  Sinus bradycardia    Time spent: [place] [place] [facility] Pager [phone]. If 8PM-8AM, please contact night-coverage at [email or web address], password [facility] 03/07/2012, 10:36 AM  LOS: 3 days

## 2012-03-07 NOTE — Progress Notes (Signed)
Subjective:  SOB improved. Sometimes feels mild wheeze after taking pills. She points to her upper anterior neck.   Objective:  Vital Signs in the last 24 hours: Temp:  [98 F (36.7 C)-98.4 F (36.9 C)] 98.4 F (36.9 C) (12/01 0416) Pulse Rate:  [58-65] 58  (12/01 1007) Resp:  [18-19] 19  (12/01 0416) BP: (105-147)/(65-75) 147/74 mmHg (12/01 1007) SpO2:  [92 %-97 %] 96 % (12/01 0832) Weight:  [94.62 kg (208 lb 9.6 oz)] 94.62 kg (208 lb 9.6 oz) (12/01 0416)  Intake/Output from previous day: 11/30 0701 - 12/01 0700 In: 360 [P.O.:360] Out: 3450 [Urine:3450]   Physical Exam: General: Well developed, well nourished, in no acute distress.  Head: Normocephalic and atraumatic.  Lungs: Clear to auscultation and percussion.  Heart: Normal/ Brady S1 and S2. Soft systolic murmur LLSB, no rubs or gallops.  Abdomen: soft, non-tender, positive bowel sounds. Obese  Extremities: No clubbing or cyanosis. No edema.  Neurologic: Alert and oriented x 3.    Lab Results:  [facility] 03/07/12 0610 03/06/12 0505  WBC 8.7 8.7  HGB 11.0* 11.3*  PLT 279 264    Basename 03/07/12 0610 03/05/12 0420  NA 139 140  K 4.2 3.8  CL 106 109  CO2 24 21  GLUCOSE 86 130*  BUN 21 11  CREATININE 1.35* 1.11*    Basename 03/06/12 0835 03/05/12 1038  TROPONINI 0.98* 1.90*   Hepatic Function Panel  Basename 03/05/12 0420  PROT 6.3  ALBUMIN 3.2*  AST 17  ALT 10  ALKPHOS 69  BILITOT 0.2*  BILIDIR --  IBILI --    Telemetry: Sinus brady, no pauses.  Personally viewed.   EKG:  Tomorrow. Apical hypertrophy variant T waves noted.   Cardiac Studies:  Normal EF, thickened apex/apical hypertrophy.   Assessment/Plan:  Principal Problem:  *Paroxysmal atrial fibrillation/flutter Active Problems:  HTN (hypertension)  NSTEMI (non-ST elevated myocardial infarction)  Apical variant hypertrophic cardiomyopathy  Chronic diastolic heart failure  Sinus bradycardia  66 year old with highly symptomatic  paroxsymal atrial fibrillation due to apical variant hypertrophic cardiomyopathy with mild COPD, mildly elevated troponin/ NSTEMI, diastolic HF who sustained significant bradycardia after receiving Flecainide and diltiazem in ER, now stable on amiodarone load.   1. Atrial fibrillation - no further occurrence after chemical conversion. Significant bradycardia no longer after stopping IV diltiazem and home Toprol 38m. She is tolerating amio 400 BID well. As of note, TSH 17 but Free T4 0.83 for future monitoring sake. LFT OK.   EP-Dr. Klein-discussed and consulted. May need pacer in future if brady arrhythmias occur. This is why we were monitoring her here during amiodarone initiation.   2. Chronic anticoagulation - Xarelto  3. Apical variant hypertrophic cardiomyopathy - this is major reason why she is so symptomatic with afib. Amiodarone is really only choice in medication given Qtc of 4631m Spade shaped ventricle was noted on cath LV gram in 7/13. ECHO demonstrates this hypertrophy. MRI may not be necessary.   4. NSTEMI - likely type 2, demand ischemia especially with thickened apex. Trop trended down.   5. Anemia - Hg 11.0 from 13.2 on admit. In part dilutional. No obvious bleeding source. Per primary team.   Hopeful DC tomorrow if stable on AMIO. ECG in am.   4    Amy [name] 03/07/2012, 10:26 AM

## 2012-03-08 DIAGNOSIS — I5032 Chronic diastolic (congestive) heart failure: Secondary | ICD-10-CM | POA: Diagnosis not present

## 2012-03-08 DIAGNOSIS — I498 Other specified cardiac arrhythmias: Secondary | ICD-10-CM | POA: Diagnosis not present

## 2012-03-08 DIAGNOSIS — I214 Non-ST elevation (NSTEMI) myocardial infarction: Secondary | ICD-10-CM | POA: Diagnosis not present

## 2012-03-08 DIAGNOSIS — I4891 Unspecified atrial fibrillation: Secondary | ICD-10-CM | POA: Diagnosis not present

## 2012-03-08 DIAGNOSIS — I428 Other cardiomyopathies: Secondary | ICD-10-CM | POA: Diagnosis not present

## 2012-03-08 DIAGNOSIS — J449 Chronic obstructive pulmonary disease, unspecified: Secondary | ICD-10-CM | POA: Diagnosis not present

## 2012-03-08 LAB — BASIC METABOLIC PANEL
BUN: 18 mg/dL (ref 6–23)
CO2: 21 mEq/L (ref 19–32)
Calcium: 9 mg/dL (ref 8.4–10.5)
Chloride: 106 mEq/L (ref 96–112)
Creatinine, Ser: 1.24 mg/dL — ABNORMAL HIGH (ref 0.50–1.10)

## 2012-03-08 LAB — CBC
HCT: 35.8 % — ABNORMAL LOW (ref 36.0–46.0)
MCH: 28.5 pg (ref 26.0–34.0)
MCV: 88.6 fL (ref 78.0–100.0)
RBC: 4.04 MIL/uL (ref 3.87–5.11)
RDW: 15.4 % (ref 11.5–15.5)
WBC: 9.3 10*3/uL (ref 4.0–10.5)

## 2012-03-08 MED ORDER — ASPIRIN 81 MG PO TBEC: 81.0000 mg | DELAYED_RELEASE_TABLET | Freq: Every day | ORAL | Status: DC

## 2012-03-08 MED ORDER — RIVAROXABAN 20 MG PO TABS: 20.0000 mg | ORAL_TABLET | Freq: Every day | ORAL | Status: DC

## 2012-03-08 MED ORDER — AMIODARONE HCL 400 MG PO TABS: 400.0000 mg | ORAL_TABLET | Freq: Two times a day (BID) | ORAL | Status: DC

## 2012-03-08 NOTE — Progress Notes (Signed)
Patient seen and examined by me.  Had a run of SVT that she felt yesterday.  HR still on the lower side.  Await cardiology for recommendations.  Eulogio Bear DO

## 2012-03-08 NOTE — Progress Notes (Signed)
Subjective:  Doing well. Ambulating well. Visiting cousin who is in hospital in 2023.   Yesterday had 17 beats of SVT (150-160 bpm), likely PAT, no change in QRS axis or confirmation. Rare PAC otherwise. She did feel the tachycardia. No dizziness, no syncope.   Objective:  Vital Signs in the last 24 hours: Temp:  [97.8 F (36.6 C)-98 F (36.7 C)] 98 F (36.7 C) (12/02 0500) Pulse Rate:  [56-68] 56  (12/02 0500) Resp:  [16-20] 20  (12/02 0500) BP: (116-147)/(74-81) 137/81 mmHg (12/02 0500) SpO2:  [92 %-95 %] 94 % (12/02 0805) Weight:  [93.396 kg (205 lb 14.4 oz)] 93.396 kg (205 lb 14.4 oz) (12/02 0500)  Intake/Output from previous day: 12/01 0701 - 12/02 0700 In: 120 [P.O.:120] Out: 1600 [Urine:1600]   Physical Exam: General: Well developed, well nourished, in no acute distress. Head:  Normocephalic and atraumatic. Lungs: Clear to auscultation and percussion. Heart: Normal S1 and S2.  Soft systolic LLSB murmur, rubs or gallops.  Abdomen: soft, non-tender, positive bowel sounds. Obese Extremities: No clubbing or cyanosis. No edema. Neurologic: Alert and oriented x 3.    Lab Results:  Basename 03/08/12 0530 03/07/12 0610  WBC 9.3 8.7  HGB 11.5* 11.0*  PLT 279 279    Basename 03/08/12 0530 03/07/12 0610  NA 140 139  K 4.4 4.2  CL 106 106  CO2 21 24  GLUCOSE 90 86  BUN 18 21  CREATININE 1.24* 1.35*    Basename 03/06/12 0835 03/05/12 1038  TROPONINI 0.98* 1.90*   Telemetry: As above Personally viewed.   EKG:  NSR, T wave changes characteristic of apical hypertrophy. Qtc 486m this am. Improved.   Cardiac Studies:  EF 65% apical hypertrophy, MAC, mild MR.  - Left ventricle: The cavity size was normal. There was mild concentric hypertrophy with associated apical thickening/hypertrophy giving the appearance of apical predominant hypertrophic cardiomyopathy. Systolic function was vigorous. The estimated ejection fraction was in the range of 65% to 70%. Although  no diagnostic regional wall motion abnormality was identified, this possibility cannot be completely excluded on the basis of this study. Doppler parameters are consistent with high ventricular filling pressure. - Mitral valve: Moderately calcified annulus. Mild regurgitation. - Left atrium: The atrium was mildly to moderately dilated.     Assessment/Plan:  Principal Problem:  *Paroxysmal atrial fibrillation/flutter Active Problems:  HTN (hypertension)  NSTEMI (non-ST elevated myocardial infarction)  Apical variant hypertrophic cardiomyopathy  Chronic diastolic heart failure  Sinus bradycardia  PAF - doing well on amiodarone load 402mBID. One brief episode of SVT/PAT yesterday evening as described above. Continue with load. No indication for pacer at this time. No significant bradycardia. EP has consulted (Dr. KlCaryl Comes Does not tolerate tachycardia well especially because of her apical hypertrophy variant. She had significant bradycardia following diltiazem gtt and Flecainide given in ER on admit.   NSTEMI - From demand ischemia, apical variant hypertrophy. Peak Trop 1.9 trended downward. Was on heparin IV now Xarelto for AFIB. Doing well. EF normal. Prior recent cath in July of 2013 with small D2 not amenable to PCI.   Apical hypertrophy variant cardiomyopathy. Discussed with patient. Appreciate Dr. KlOlin Pianput. Will not tolerate rapid AFIB well. AMIO. Stopped toprol because of bradycardia.   Hypotension - resolved with IVF hydration  Mild anemia - monitor, Hg 11.5.   Creat 1.24,   Will have her follow up with Dr. TuRadford Paxriday 12/6 at 11:30am. Continue with amiodarone 40034mID until then. At that visit, Dr. TurRadford Pax  may decrease to 27m QD.   I am fine with her discharge today.   Amy [name], [place]12/05/2011, 9:41 AM

## 2012-03-08 NOTE — Progress Notes (Signed)
[facility] PROGRESS NOTE  Amy [name] MYT:[id number] DOB: January 25, 1946 DOA: 03/04/2012 PCP: [name], MD  Assessment/Plan: #1. A. fib with RVR -  17 beats of SVT at 17:23 on 12/1.  Now brady in the 47 - 55 range.  Xeralto started 12/1.  amio started 11/30.- plan per cardiology- patient feeling better  #2. Chest pain - presently resolved. Patient had a cardiac cath recently in July which was unremarkable. trend cardiac markers.    #3. COPD - presently not wheezing. Continue inhalers.   #4. Hypothyroidism - continue Synthroid.  TSH high- on synthroid, patient has missed several doses of her synthroid, will recheck labs in 4-6 wks as an outpatient, free t4 ok  #5. AKI- improving.  baseline around 1  Code Status: full Family Communication: patient at bedside Disposition Plan: Discharge when ok with cardiology.   Consultants:  cardiology  HPI/Subjective: Feeling well, says had some wheezing yesterday- off O2 today   Objective: Filed Vitals:   03/07/12 2053 03/07/12 2100 03/08/12 0500 03/08/12 0805  BP:  116/77 137/81   Pulse: 66 59 56   Temp:  97.8 F (36.6 C) 98 F (36.7 C)   TempSrc:      Resp: _0 Height:      Weight:   93.396 kg (205 lb 14.4 oz)   SpO2:  95% 92% 94%    Intake/Output Summary (Last 24 hours) at 03/08/12 0912 Last data filed at 03/07/12 2100  Gross per 24 hour  Intake    120 ml  Output   1600 ml  Net  -1480 ml   Filed Weights   03/06/12 0457 03/07/12 0416 03/08/12 0500  Weight: 94 kg (207 lb 3.7 oz) 94.62 kg (208 lb 9.6 oz) 93.396 kg (205 lb 14.4 oz)    Exam:   General:  A+Ox3, NAD, sitting up.  Appears well.  Cardiovascular: rrr  Respiratory: clear anterior, no wheezing  Abdomen: +BS, soft, NT/ND  Data Reviewed: Basic Metabolic Panel:  Lab 41/03/01 0530 03/07/12 0610 03/05/12 0420 03/04/12 2332 03/04/12 1834  NA 140 139 140 -- 141  K 4.4 4.2 3.8 -- 3.5  CL 106 106 109 -- 106  CO2 _1 -- 26    GLUCOSE 90 86 130* -- 83  BUN _2 -- 11  CREATININE 1.24* 1.35* 1.11* 0.92 0.91  CALCIUM 9.0 8.9 7.8* -- 8.8  MG -- -- -- -- --  PHOS -- -- -- -- --   Liver Function Tests:  Lab 03/05/12 0420  AST 17  ALT 10  ALKPHOS 69  BILITOT 0.2*  PROT 6.3  ALBUMIN 3.2*   CBC:  Lab 03/08/12 0530 03/07/12 0610 03/06/12 0505 03/05/12 1038 03/05/12 0420  WBC 9.3 8.7 8.7 9.6 8.0  NEUTROABS -- -- -- -- 5.1  HGB 11.5* 11.0* 11.3* 11.3* 11.5*  HCT 35.8* 34.2* 34.5* 34.8* 35.4*  MCV 88.6 89.5 91.0 89.7 89.6  PLT 279 279 264 267 269   Cardiac Enzymes:  Lab 03/06/12 0835 03/05/12 1038 03/05/12 0420 03/04/12 2332  CKTOTAL -- -- -- --  CKMB -- -- -- --  CKMBINDEX -- -- -- --  TROPONINI 0.98* 1.90* 0.97* 0.44*      Studies: No results found.  Scheduled Meds:    . amiodarone  400 mg Oral BID  . aspirin EC  81 mg Oral Daily  . atorvastatin  40 mg Oral q1800  . levothyroxine  88 mcg Oral QAC breakfast  . mometasone-formoterol  2  puff Inhalation BID  . rivaroxaban  20 mg Oral Q supper  . sodium chloride  3 mL Intravenous Q12H  . sodium chloride  3 mL Intravenous Q12H   Continuous Infusions:    Principal Problem:  *Paroxysmal atrial fibrillation/flutter Active Problems:  HTN (hypertension)  NSTEMI (non-ST elevated myocardial infarction)  Apical variant hypertrophic cardiomyopathy  Chronic diastolic heart failure  Sinus bradycardia    Time spent: [address]  [facility] Pager [phone] If 8PM-8AM, please contact night-coverage at [email or web address], password [facility] 03/08/2012, 9:12 AM  LOS: 4 days

## 2012-03-08 NOTE — Progress Notes (Signed)
Pt discharged to home per MD order. Pt received and reviewed all discharge instructions and medication information including follow-up appointments and new prescriptions. RN attempted to make follow-up appointment with primary Dr. Radene Ou, but no answer on the line after two attempts.  Pt alert and oriented at discharge with no complaints of pain.  Pt escorted to private vehicle via wheelchair by guest services.  Amy [name]

## 2012-03-08 NOTE — Discharge Summary (Addendum)
Physician Discharge Summary  Amy [name] YTK:[id number] DOB: 1945/04/12 DOA: 03/04/2012  PCP: [name], MD  Admit date: 03/04/2012 Discharge date: 03/08/2012  Time spent: 35 minutes  Recommendations for Outpatient Follow-up:  1. Cardiology follow up on Friday 12/6 2. CBC, BMP on Friday 3. TSH/free t4 in 4-6 weeks  Discharge Diagnoses:  Principal Problem:  *Paroxysmal atrial fibrillation/flutter Active Problems:  HTN (hypertension)  NSTEMI (non-ST elevated myocardial infarction)  Apical variant hypertrophic cardiomyopathy  Chronic diastolic heart failure  Sinus bradycardia   Discharge Condition: improved  Diet recommendation: cardiac  Filed Weights   03/06/12 0457 03/07/12 0416 03/08/12 0500  Weight: 94 kg (207 lb 3.7 oz) 94.62 kg (208 lb 9.6 oz) 93.396 kg (205 lb 14.4 oz)    History of present illness:  66 year old female with history of paroxysmal atrial fibrillation, COPD presents with complaints of palpitations. Patient's palpitations started this evening after patient was coming out of a movie theater. Patient also had associated chest pressure, retrosternally and nonradiating. Denies masses or shortness of breath diaphoresis nausea vomiting. In the ER patient was found to be in atrial flutter fibrillation with RVR. Was initially given Cardizem 20 mg IV followed by Cardizem CD 120 mg by mouth and was started on flecainide after discussing with cardiologist on call. Patient's heart rate improved with Cardizem bolus and at this time rate is controlled but still in A. fib. Patient will be admitted for further observation. Patient's chest pain resolved with control of heart rate.   Hospital Course:  #1. A. fib with RVR - 17 beats of SVT at 17:23 on 12/1. Now brady in the 47 - 55 range. Xeralto started 12/1. amio started 11/30.- plan per cardiology- is to leave on 400 bID until appointment on Friday,  patient feeling better   #2. Chest pain - presently resolved.  Patient had a cardiac cath recently in July which was unremarkable. trend cardiac markers.   #3. COPD - presently not wheezing. Continue inhalers.   #4. Hypothyroidism - continue Synthroid. TSH high- on synthroid, patient has missed several doses of her synthroid, will recheck labs in 4-6 wks as an outpatient, free t4 ok   #5. AKI- improving. baseline around 1- BMP on Friday   Procedures:  Echo: Study Conclusions  - Left ventricle: The cavity size was normal. There was mild concentric hypertrophy with associated apical thickening/hypertrophy giving the appearance of apical predominant hypertrophic cardiomyopathy. Systolic function was vigorous. The estimated ejection fraction was in the range of 65% to 70%. Although no diagnostic regional wall motion abnormality was identified, this possibility cannot be completely excluded on the basis of this study. Doppler parameters are consistent with high ventricular filling pressure. - Mitral valve: Moderately calcified annulus. Mild regurgitation. - Left atrium: The atrium was mildly to moderately dilated.    Consultations:  Cardiology  EP    Discharge Exam: Filed Vitals:   03/07/12 2100 03/08/12 0500 03/08/12 0805 03/08/12 0954  BP: 116/77 137/81  135/79  Pulse: 59 56  55  Temp: 97.8 F (36.6 C) 98 F (36.7 C)    TempSrc:      Resp: 20 20    Height:      Weight:  93.396 kg (205 lb 14.4 oz)    SpO2: 95% 92% 94%     General: A+Ox3, NAD Cardiovascular: rrr Respiratory: clear anterior  Discharge Instructions      Discharge Orders    Future Orders Please Complete By Expires   Diet - low sodium heart healthy  Increase activity slowly      Discharge instructions      Comments:   CBC, BMP on Friday when you have you appointment       Medication List     As of 03/08/2012 10:10 AM    STOP taking these medications         lisinopril 20 MG tablet   Commonly known as: PRINIVIL,ZESTRIL      metoprolol  succinate 25 MG 24 hr tablet   Commonly known as: TOPROL-XL      TAKE these medications         albuterol 108 (90 BASE) MCG/ACT inhaler   Commonly known as: PROVENTIL HFA;VENTOLIN HFA   Inhale 2 puffs into the lungs every 6 (six) hours as needed. Shortness of breath      amiodarone 400 MG tablet   Commonly known as: PACERONE   Take 1 tablet (400 mg total) by mouth 2 (two) times daily.      aspirin 81 MG EC tablet   Take 1 tablet (81 mg total) by mouth daily.      atorvastatin 40 MG tablet   Commonly known as: LIPITOR   Take 40 mg by mouth daily.      Fluticasone-Salmeterol 100-50 MCG/DOSE Aepb   Commonly known as: ADVAIR   Inhale 1 puff into the lungs 2 (two) times daily.      furosemide 20 MG tablet   Commonly known as: LASIX   Take 1 tablet (20 mg total) by mouth daily.      levothyroxine 88 MCG tablet   Commonly known as: SYNTHROID, LEVOTHROID   Take 88 mcg by mouth daily.      nitroGLYCERIN 0.4 MG SL tablet   Commonly known as: NITROSTAT   Place 1 tablet (0.4 mg total) under the tongue every 5 (five) minutes as needed for chest pain.      Rivaroxaban 20 MG Tabs   Commonly known as: XARELTO   Take 1 tablet (20 mg total) by mouth daily with supper.        Follow-up Information    Follow up with [name], MD. Schedule an appointment as soon as possible for a visit in 2 weeks.   Contact information:   [address] [place] [address] [phone]       Follow up with Sueanne Margarita, MD. On 03/12/2012. (11:30am)    Contact information:   [address] [place] [address] [phone]           The results of significant diagnostics from this hospitalization (including imaging, microbiology, ancillary and laboratory) are listed below for reference.    Significant Diagnostic Studies: Mm Digital Screening  02/10/2012  *RADIOLOGY REPORT*  Clinical Data: Screening.  DIGITAL BILATERAL SCREENING MAMMOGRAM WITH CAD  Comparison:   Previous exams.  Findings:  There are scattered fibroglandular densities. No suspicious masses, architectural distortion, or calcifications are present.  Images were processed with CAD.  IMPRESSION: No mammographic evidence of malignancy.  A result letter of this screening mammogram will be mailed directly to the patient.  RECOMMENDATION: Screening mammogram in one year. (Code:SM-B-01Y)  BI-RADS CATEGORY 1:  Negative.   Original Report Authenticated By: Lillia Mountain, M.D.     Microbiology: No results found for this or any previous visit (from the past 240 hour(s)).   Labs: Basic Metabolic Panel:  Lab 25/42/70 0530 03/07/12 0610 03/05/12 0420 03/04/12 2332 03/04/12 1834  NA 140 139 140 -- 141  K 4.4  4.2 3.8 -- 3.5  CL 106 106 109 -- 106  CO2 _0 -- 26  GLUCOSE 90 86 130* -- 83  BUN _1 -- 11  CREATININE 1.24* 1.35* 1.11* 0.92 0.91  CALCIUM 9.0 8.9 7.8* -- 8.8  MG -- -- -- -- --  PHOS -- -- -- -- --   Liver Function Tests:  Lab 03/05/12 0420  AST 17  ALT 10  ALKPHOS 69  BILITOT 0.2*  PROT 6.3  ALBUMIN 3.2*   No results found for this basename: LIPASE:5,AMYLASE:5 in the last 168 hours No results found for this basename: AMMONIA:5 in the last 168 hours CBC:  Lab 03/08/12 0530 03/07/12 0610 03/06/12 0505 03/05/12 1038 03/05/12 0420  WBC 9.3 8.7 8.7 9.6 8.0  NEUTROABS -- -- -- -- 5.1  HGB 11.5* 11.0* 11.3* 11.3* 11.5*  HCT 35.8* 34.2* 34.5* 34.8* 35.4*  MCV 88.6 89.5 91.0 89.7 89.6  PLT 279 279 264 267 269   Cardiac Enzymes:  Lab 03/06/12 0835 03/05/12 1038 03/05/12 0420 03/04/12 2332  CKTOTAL -- -- -- --  CKMB -- -- -- --  CKMBINDEX -- -- -- --  TROPONINI 0.98* 1.90* 0.97* 0.44*   BNP: BNP (last 3 results) No results found for this basename: PROBNP:3 in the last 8760 hours CBG: No results found for this basename: GLUCAP:5 in the last 168 hours     Signed:  Eulogio Bear  [facility] 03/08/2012, 10:10 AM

## 2012-03-12 DIAGNOSIS — E669 Obesity, unspecified: Secondary | ICD-10-CM | POA: Diagnosis not present

## 2012-03-12 DIAGNOSIS — I519 Heart disease, unspecified: Secondary | ICD-10-CM | POA: Diagnosis not present

## 2012-03-12 DIAGNOSIS — Z79899 Other long term (current) drug therapy: Secondary | ICD-10-CM | POA: Diagnosis not present

## 2012-03-12 DIAGNOSIS — I4891 Unspecified atrial fibrillation: Secondary | ICD-10-CM | POA: Diagnosis not present

## 2012-03-12 DIAGNOSIS — I10 Essential (primary) hypertension: Secondary | ICD-10-CM | POA: Diagnosis not present

## 2012-03-23 DIAGNOSIS — Z79899 Other long term (current) drug therapy: Secondary | ICD-10-CM | POA: Diagnosis not present

## 2012-03-24 DIAGNOSIS — I4891 Unspecified atrial fibrillation: Secondary | ICD-10-CM | POA: Diagnosis not present

## 2012-03-24 DIAGNOSIS — E039 Hypothyroidism, unspecified: Secondary | ICD-10-CM | POA: Diagnosis not present

## 2012-04-19 DIAGNOSIS — E785 Hyperlipidemia, unspecified: Secondary | ICD-10-CM | POA: Diagnosis not present

## 2012-04-19 DIAGNOSIS — I10 Essential (primary) hypertension: Secondary | ICD-10-CM | POA: Diagnosis not present

## 2012-04-19 DIAGNOSIS — E669 Obesity, unspecified: Secondary | ICD-10-CM | POA: Diagnosis not present

## 2012-04-27 DIAGNOSIS — J069 Acute upper respiratory infection, unspecified: Secondary | ICD-10-CM | POA: Diagnosis not present

## 2012-04-29 DIAGNOSIS — H40019 Open angle with borderline findings, low risk, unspecified eye: Secondary | ICD-10-CM | POA: Diagnosis not present

## 2012-05-22 ENCOUNTER — Other Ambulatory Visit: Payer: Self-pay

## 2012-06-03 DIAGNOSIS — I509 Heart failure, unspecified: Secondary | ICD-10-CM | POA: Diagnosis not present

## 2012-06-17 DIAGNOSIS — I519 Heart disease, unspecified: Secondary | ICD-10-CM | POA: Diagnosis not present

## 2012-10-22 DIAGNOSIS — E559 Vitamin D deficiency, unspecified: Secondary | ICD-10-CM | POA: Diagnosis not present

## 2012-10-22 DIAGNOSIS — E039 Hypothyroidism, unspecified: Secondary | ICD-10-CM | POA: Diagnosis not present

## 2012-11-10 ENCOUNTER — Other Ambulatory Visit: Payer: Self-pay

## 2012-11-29 DIAGNOSIS — Z0000 Encounter for general adult medical examination without abnormal findings: Secondary | ICD-10-CM | POA: Diagnosis not present

## 2012-11-29 DIAGNOSIS — R5381 Other malaise: Secondary | ICD-10-CM | POA: Diagnosis not present

## 2012-11-29 DIAGNOSIS — E559 Vitamin D deficiency, unspecified: Secondary | ICD-10-CM | POA: Diagnosis not present

## 2012-11-29 DIAGNOSIS — E785 Hyperlipidemia, unspecified: Secondary | ICD-10-CM | POA: Diagnosis not present

## 2012-11-29 DIAGNOSIS — I10 Essential (primary) hypertension: Secondary | ICD-10-CM | POA: Diagnosis not present

## 2012-11-29 DIAGNOSIS — I4891 Unspecified atrial fibrillation: Secondary | ICD-10-CM | POA: Diagnosis not present

## 2012-11-29 DIAGNOSIS — F329 Major depressive disorder, single episode, unspecified: Secondary | ICD-10-CM | POA: Diagnosis not present

## 2012-11-29 DIAGNOSIS — E039 Hypothyroidism, unspecified: Secondary | ICD-10-CM | POA: Diagnosis not present

## 2012-12-15 DIAGNOSIS — G4710 Hypersomnia, unspecified: Secondary | ICD-10-CM | POA: Diagnosis not present

## 2012-12-15 DIAGNOSIS — I10 Essential (primary) hypertension: Secondary | ICD-10-CM | POA: Diagnosis not present

## 2012-12-15 DIAGNOSIS — I519 Heart disease, unspecified: Secondary | ICD-10-CM | POA: Diagnosis not present

## 2012-12-15 DIAGNOSIS — I4891 Unspecified atrial fibrillation: Secondary | ICD-10-CM | POA: Diagnosis not present

## 2012-12-15 DIAGNOSIS — R0602 Shortness of breath: Secondary | ICD-10-CM | POA: Diagnosis not present

## 2013-01-04 DIAGNOSIS — G4733 Obstructive sleep apnea (adult) (pediatric): Secondary | ICD-10-CM | POA: Diagnosis not present

## 2013-01-18 DIAGNOSIS — G4733 Obstructive sleep apnea (adult) (pediatric): Secondary | ICD-10-CM | POA: Diagnosis not present

## 2013-02-04 ENCOUNTER — Encounter: Payer: Self-pay | Admitting: Cardiology

## 2013-02-10 ENCOUNTER — Other Ambulatory Visit: Payer: Self-pay

## 2013-02-21 DIAGNOSIS — H40019 Open angle with borderline findings, low risk, unspecified eye: Secondary | ICD-10-CM | POA: Diagnosis not present

## 2013-03-21 DIAGNOSIS — H2510 Age-related nuclear cataract, unspecified eye: Secondary | ICD-10-CM | POA: Diagnosis not present

## 2013-03-24 ENCOUNTER — Telehealth: Payer: Self-pay | Admitting: Cardiology

## 2013-03-24 NOTE — Progress Notes (Signed)
Please add a chin strap and get a download in 6 weeks

## 2013-03-24 NOTE — Telephone Encounter (Signed)
Walk in pt Form " [facility]" Surgical Clearance paper dropped off  Gave to Amelia

## 2013-03-25 ENCOUNTER — Telehealth: Payer: Self-pay | Admitting: Cardiology

## 2013-03-25 NOTE — Telephone Encounter (Signed)
Received request from Nurse fax box, documents faxed for surgical clearance. To: [facility] Fax number: [phone] Attention: 03/25/13/KM

## 2013-04-13 ENCOUNTER — Encounter: Payer: Self-pay | Admitting: Cardiology

## 2013-04-13 ENCOUNTER — Ambulatory Visit (INDEPENDENT_AMBULATORY_CARE_PROVIDER_SITE_OTHER): Payer: Medicare Other | Admitting: Cardiology

## 2013-04-13 VITALS — BP 120/60 | HR 60 | Ht 64.0 in | Wt 211.4 lb

## 2013-04-13 DIAGNOSIS — G4733 Obstructive sleep apnea (adult) (pediatric): Secondary | ICD-10-CM

## 2013-04-13 DIAGNOSIS — I5032 Chronic diastolic (congestive) heart failure: Secondary | ICD-10-CM

## 2013-04-13 DIAGNOSIS — I480 Paroxysmal atrial fibrillation: Secondary | ICD-10-CM

## 2013-04-13 DIAGNOSIS — I10 Essential (primary) hypertension: Secondary | ICD-10-CM

## 2013-04-13 DIAGNOSIS — I4891 Unspecified atrial fibrillation: Secondary | ICD-10-CM

## 2013-04-13 DIAGNOSIS — J069 Acute upper respiratory infection, unspecified: Secondary | ICD-10-CM | POA: Diagnosis not present

## 2013-04-13 DIAGNOSIS — R0602 Shortness of breath: Secondary | ICD-10-CM | POA: Diagnosis not present

## 2013-04-13 DIAGNOSIS — J3489 Other specified disorders of nose and nasal sinuses: Secondary | ICD-10-CM | POA: Diagnosis not present

## 2013-04-13 LAB — TSH: TSH: 2.56 u[IU]/mL (ref 0.35–5.50)

## 2013-04-13 LAB — BRAIN NATRIURETIC PEPTIDE: Pro B Natriuretic peptide (BNP): 127 pg/mL — ABNORMAL HIGH (ref 0.0–100.0)

## 2013-04-13 NOTE — Patient Instructions (Signed)
Your physician recommends that you continue on your current medications as directed. Please refer to the Current Medication list given to you today.  We are requesting that you be switched to a full face mask for your Bipap Machine. We sent the order to [facility]. One of their reps should be contacting you within the next week.  Your physician has recommended that you have a pulmonary function test. Pulmonary Function Tests are a group of tests that measure how well air moves in and out of your lungs.  Your physician recommends that you go to the lab today for a TSH and BNP Panel  Your physician recommends that you schedule a follow-up appointment in: 3 Months with Dr Radford Pax

## 2013-04-13 NOTE — Progress Notes (Signed)
[place], [place], [place]  [address] Phone: [phone] Fax:  [phone]  Date:  04/13/2013   ID:  Amy [name], DOB Nov 23, 1945, MRN [id number]  PCP:  [name], MD  Cardiologist:  Fransico Him, MD  Sleep Medicine:  Fransico Him, MD   History of Present Illness: Amy [name] is a 68 y.o. female with a history of HTN, dyslipidemia, chroni.   diastolic CHF and PAF who was complaining of hypersomnelence at her last OV and a sleep study was done which showed severe OSA with an AHI of 71/hr.  She underwent CPAP titration but could not be successfully titrated and was started on BiPAP.  She uses a nasal pillow mask and was supposed to be using a chin strap but has not been using it.  She is complaining of very dry mouth and nose.  She feels the pressure is adequate.  She says that she feels more rested in the am but still wakes up at night to go to the bathroom.  She has been taking her Lasix at night.  She has not daytime sleepiness.  She has to sleep supine to avoid leaking of her mask.   Wt Readings from Last 3 Encounters:  04/13/13 211 lb 6.4 oz (95.89 kg)  03/08/12 205 lb 14.4 oz (93.396 kg)  10/07/11 205 lb (92.987 kg)     Past Medical History  Diagnosis Date  . Hypertension   . Hyperlipidemia   . Hypothyroid   . COPD (chronic obstructive pulmonary disease)   . Dizziness     had PT for being off balance - in approx. 2012  . Lumbar disc disease   . Macular degeneration   . Chronic diastolic heart failure   . Apical variant hypertrophic cardiomyopathy     Diagnosed by echo and ECG 11/13  . GERD (gastroesophageal reflux disease)   . Paroxysmal atrial fibrillation/flutter        . Sinus bradycardia     Current Outpatient Prescriptions  Medication Sig Dispense Refill  . albuterol (PROVENTIL HFA;VENTOLIN HFA) 108 (90 BASE) MCG/ACT inhaler Inhale 2 puffs into the lungs every 6 (six) hours as needed. Shortness of breath      . amiodarone  (PACERONE) 400 MG tablet Take 1 tablet (400 mg total) by mouth 2 (two) times daily.  40 tablet  0  . atorvastatin (LIPITOR) 40 MG tablet Take 40 mg by mouth daily.        Marland Kitchen BESIVANCE 0.6 % SUSP       . DUREZOL 0.05 % EMUL       . Fluticasone-Salmeterol (ADVAIR) 100-50 MCG/DOSE AEPB Inhale 1 puff into the lungs 2 (two) times daily.        . furosemide (LASIX) 20 MG tablet Take 1 tablet (20 mg total) by mouth daily.  30 tablet  11  . levothyroxine (SYNTHROID, LEVOTHROID) 88 MCG tablet Take 88 mcg by mouth daily.        Marland Kitchen PROLENSA 0.07 % SOLN       . Rivaroxaban (XARELTO) 20 MG TABS Take 1 tablet (20 mg total) by mouth daily with supper.  30 tablet  0   No current facility-administered medications for this visit.    Allergies:   No Known Allergies  Social History:  The patient  reports that she quit smoking about 4 years ago. Her smoking use included Cigarettes. She has a 13.2 pack-year smoking history. She has never used smokeless tobacco. She reports that she  drinks alcohol. She reports that she does not use illicit drugs.   Family History:  The patient's family history includes Breast cancer in her paternal aunt; Gout in her father; Heart disease in her sister; Lung cancer in her father.   ROS:  Please see the history of present illness.      All other systems reviewed and negative.   PHYSICAL EXAM: VS:  BP 120/60  Pulse 60  Ht 5' 4" (1.626 m)  Wt 211 lb 6.4 oz (95.89 kg)  BMI 36.27 kg/m2 Well nourished, well developed, in no acute distress HEENT: normal Neck: no JVD Cardiac:  normal S1, S2; RRR; no murmur Lungs:  clear to auscultation bilaterally, no wheezing, rhonchi or rales Abd: soft, nontender, no hepatomegaly Ext: no edema Skin: warm and dry Neuro:  CNs 2-12 intact, no focal abnormalities noted       ASSESSMENT AND PLAN:  1. Severe OSA on BiPAP - her download today showed an AHI of 24/hr on 19/15cm H2O and 57% compliance in using more than 4 hours nightly.    -  continue BiPAP at current settings  - I encouraged her to use her chin strap to avoid dry mouth and adjust her humidification as needed  - change to a full face mask due to severe leaking of her nasal pillow mask and get a download in 4 weeks.  I have also instructed her to avoid sleeping supine 2. HTN  3. Obesity 4. Chronic diastolic CHF - compensated but still has DOE  - continue Lasix and take in the am 5.  PAF maintaining NSR  - continue Xarelto/Amiodarone  - PFT's with DLCO to make sure Amio is not having any effect on her lungs  - she is up to date on LFTs/CBC and BMET but needs a TSH for amio  Followup with me in 3 months  Signed, Fransico Him, MD 04/13/2013 8:41 AM

## 2013-04-24 ENCOUNTER — Inpatient Hospital Stay (HOSPITAL_COMMUNITY)
Admission: EM | Admit: 2013-04-24 | Discharge: 2013-04-26 | DRG: 194 | Disposition: A | Discharge status: Home / Self Care | Payer: Medicare Other | Attending: Internal Medicine | Admitting: Internal Medicine

## 2013-04-24 ENCOUNTER — Emergency Department (HOSPITAL_COMMUNITY): Payer: Medicare Other

## 2013-04-24 DIAGNOSIS — I509 Heart failure, unspecified: Secondary | ICD-10-CM | POA: Diagnosis present

## 2013-04-24 DIAGNOSIS — R0609 Other forms of dyspnea: Secondary | ICD-10-CM

## 2013-04-24 DIAGNOSIS — Z23 Encounter for immunization: Secondary | ICD-10-CM

## 2013-04-24 DIAGNOSIS — N189 Chronic kidney disease, unspecified: Secondary | ICD-10-CM | POA: Diagnosis present

## 2013-04-24 DIAGNOSIS — R0902 Hypoxemia: Secondary | ICD-10-CM | POA: Diagnosis present

## 2013-04-24 DIAGNOSIS — Z87891 Personal history of nicotine dependence: Secondary | ICD-10-CM | POA: Diagnosis not present

## 2013-04-24 DIAGNOSIS — E785 Hyperlipidemia, unspecified: Secondary | ICD-10-CM | POA: Diagnosis present

## 2013-04-24 DIAGNOSIS — I422 Other hypertrophic cardiomyopathy: Secondary | ICD-10-CM | POA: Diagnosis present

## 2013-04-24 DIAGNOSIS — I059 Rheumatic mitral valve disease, unspecified: Secondary | ICD-10-CM | POA: Diagnosis not present

## 2013-04-24 DIAGNOSIS — H3530 Unspecified macular degeneration: Secondary | ICD-10-CM | POA: Diagnosis present

## 2013-04-24 DIAGNOSIS — Z79899 Other long term (current) drug therapy: Secondary | ICD-10-CM

## 2013-04-24 DIAGNOSIS — Z801 Family history of malignant neoplasm of trachea, bronchus and lung: Secondary | ICD-10-CM

## 2013-04-24 DIAGNOSIS — J438 Other emphysema: Secondary | ICD-10-CM | POA: Diagnosis present

## 2013-04-24 DIAGNOSIS — E039 Hypothyroidism, unspecified: Secondary | ICD-10-CM | POA: Diagnosis present

## 2013-04-24 DIAGNOSIS — J449 Chronic obstructive pulmonary disease, unspecified: Secondary | ICD-10-CM | POA: Diagnosis present

## 2013-04-24 DIAGNOSIS — E861 Hypovolemia: Secondary | ICD-10-CM | POA: Diagnosis present

## 2013-04-24 DIAGNOSIS — J189 Pneumonia, unspecified organism: Principal | ICD-10-CM | POA: Diagnosis present

## 2013-04-24 DIAGNOSIS — M519 Unspecified thoracic, thoracolumbar and lumbosacral intervertebral disc disorder: Secondary | ICD-10-CM | POA: Diagnosis present

## 2013-04-24 DIAGNOSIS — K219 Gastro-esophageal reflux disease without esophagitis: Secondary | ICD-10-CM | POA: Diagnosis present

## 2013-04-24 DIAGNOSIS — G4733 Obstructive sleep apnea (adult) (pediatric): Secondary | ICD-10-CM | POA: Diagnosis present

## 2013-04-24 DIAGNOSIS — N179 Acute kidney failure, unspecified: Secondary | ICD-10-CM | POA: Diagnosis not present

## 2013-04-24 DIAGNOSIS — I10 Essential (primary) hypertension: Secondary | ICD-10-CM | POA: Diagnosis present

## 2013-04-24 DIAGNOSIS — I480 Paroxysmal atrial fibrillation: Secondary | ICD-10-CM

## 2013-04-24 DIAGNOSIS — I5032 Chronic diastolic (congestive) heart failure: Secondary | ICD-10-CM | POA: Diagnosis not present

## 2013-04-24 DIAGNOSIS — I129 Hypertensive chronic kidney disease with stage 1 through stage 4 chronic kidney disease, or unspecified chronic kidney disease: Secondary | ICD-10-CM | POA: Diagnosis present

## 2013-04-24 DIAGNOSIS — R0989 Other specified symptoms and signs involving the circulatory and respiratory systems: Secondary | ICD-10-CM | POA: Diagnosis not present

## 2013-04-24 DIAGNOSIS — R0600 Dyspnea, unspecified: Secondary | ICD-10-CM

## 2013-04-24 DIAGNOSIS — R0602 Shortness of breath: Secondary | ICD-10-CM | POA: Diagnosis not present

## 2013-04-24 DIAGNOSIS — I4892 Unspecified atrial flutter: Secondary | ICD-10-CM | POA: Diagnosis present

## 2013-04-24 DIAGNOSIS — Z7901 Long term (current) use of anticoagulants: Secondary | ICD-10-CM

## 2013-04-24 DIAGNOSIS — I4891 Unspecified atrial fibrillation: Secondary | ICD-10-CM | POA: Diagnosis present

## 2013-04-24 DIAGNOSIS — I7090 Unspecified atherosclerosis: Secondary | ICD-10-CM | POA: Diagnosis not present

## 2013-04-24 DIAGNOSIS — I4819 Other persistent atrial fibrillation: Secondary | ICD-10-CM | POA: Diagnosis present

## 2013-04-24 LAB — COMPREHENSIVE METABOLIC PANEL
ALBUMIN: 3.8 g/dL (ref 3.5–5.2)
ALT: 20 U/L (ref 0–35)
AST: 23 U/L (ref 0–37)
Alkaline Phosphatase: 93 U/L (ref 39–117)
BILIRUBIN TOTAL: 0.3 mg/dL (ref 0.3–1.2)
BUN: 14 mg/dL (ref 6–23)
CO2: 23 mEq/L (ref 19–32)
CREATININE: 1.17 mg/dL — AB (ref 0.50–1.10)
Calcium: 9 mg/dL (ref 8.4–10.5)
Chloride: 103 mEq/L (ref 96–112)
GFR calc non Af Amer: 47 mL/min — ABNORMAL LOW (ref >90)
GFR, EST AFRICAN AMERICAN: 55 mL/min — AB (ref >90)
GLUCOSE: 92 mg/dL (ref 70–99)
Potassium: 4.6 mEq/L (ref 3.7–5.3)
Sodium: 140 mEq/L (ref 137–147)
TOTAL PROTEIN: 8.5 g/dL — AB (ref 6.0–8.3)

## 2013-04-24 LAB — CBC WITH DIFFERENTIAL/PLATELET
BASOS PCT: 0 % (ref 0–1)
Basophils Absolute: 0 10*3/uL (ref 0.0–0.1)
EOS ABS: 0.2 10*3/uL (ref 0.0–0.7)
Eosinophils Relative: 2 % (ref 0–5)
HEMATOCRIT: 37.7 % (ref 36.0–46.0)
HEMOGLOBIN: 12.4 g/dL (ref 12.0–15.0)
LYMPHS ABS: 1.4 10*3/uL (ref 0.7–4.0)
Lymphocytes Relative: 15 % (ref 12–46)
MCH: 29.5 pg (ref 26.0–34.0)
MCHC: 32.9 g/dL (ref 30.0–36.0)
MCV: 89.5 fL (ref 78.0–100.0)
MONO ABS: 0.8 10*3/uL (ref 0.1–1.0)
MONOS PCT: 9 % (ref 3–12)
Neutro Abs: 6.9 10*3/uL (ref 1.7–7.7)
Neutrophils Relative %: 75 % (ref 43–77)
Platelets: 270 10*3/uL (ref 150–400)
RBC: 4.21 MIL/uL (ref 3.87–5.11)
RDW: 16.3 % — ABNORMAL HIGH (ref 11.5–15.5)
WBC: 9.3 10*3/uL (ref 4.0–10.5)

## 2013-04-24 LAB — TROPONIN I

## 2013-04-24 MED ORDER — DEXTROSE 5 % IV SOLN
1.0000 g | Freq: Once | INTRAVENOUS | Status: AC
  Administered 2013-04-25: 1 g via INTRAVENOUS
  Filled 2013-04-24: qty 10

## 2013-04-24 MED ORDER — ONDANSETRON HCL 4 MG/2ML IJ SOLN
4.0000 mg | Freq: Once | INTRAMUSCULAR | Status: AC
  Administered 2013-04-24: 4 mg via INTRAVENOUS
  Filled 2013-04-24: qty 2

## 2013-04-24 MED ORDER — DEXTROSE 5 % IV SOLN
500.0000 mg | Freq: Once | INTRAVENOUS | Status: AC
  Administered 2013-04-25: 500 mg via INTRAVENOUS

## 2013-04-24 MED ORDER — MORPHINE SULFATE 4 MG/ML IJ SOLN
4.0000 mg | Freq: Once | INTRAMUSCULAR | Status: AC
  Administered 2013-04-24: 4 mg via INTRAVENOUS
  Filled 2013-04-24: qty 1

## 2013-04-24 NOTE — ED Notes (Signed)
EMS-pt reports that she had sudden onset of left upper back pain about 6th rib with increase of sob. Pt states that she has been sob with cough x several weeks intermittently. 20g(L)hand. EMS was unable to get a good pulse ox reading because of nails. Pt reports cough had been productive. Denies any radiation of pain into chest at this time.

## 2013-04-24 NOTE — ED Provider Notes (Signed)
CSN: [id number]     Arrival date & time 04/24/13  2107 History   First MD Initiated Contact with Patient 04/24/13 2115     Chief Complaint  Patient presents with  . Back Pain  . Shortness of Breath   (Consider location/radiation/quality/duration/timing/severity/associated sxs/prior Treatment) HPI Comments: Patient with history of COPD, CHF, sleep apnea, atrial fibrillation on xarelto presenting with shortness of breath as been ongoing for the past day. She felt short of breath and dry cough all day and felt well yesterday. She was able to go to the store able to watch TV. She denies any chest pain. About an hour ago she went to the bathroom and had acute onset of right-sided upper back pain and increase in her shortness of breath. Pain is worse with movement worse with palpation. Is better with rest. He denies coughing when the pain started. She denies any chest pain. She denies any leg pain leg swelling. She is has not had pain like this in the past.  The history is provided by the patient and the EMS personnel.    Past Medical History  Diagnosis Date  . Hypertension   . Hyperlipidemia   . Hypothyroid   . COPD (chronic obstructive pulmonary disease)   . Dizziness     had PT for being off balance - in approx. 2012  . Lumbar disc disease   . Macular degeneration   . Chronic diastolic heart failure   . Apical variant hypertrophic cardiomyopathy     Diagnosed by echo and ECG 11/13  . GERD (gastroesophageal reflux disease)   . Paroxysmal atrial fibrillation/flutter        . Sinus bradycardia    Past Surgical History  Procedure Laterality Date  . Cesarean section  1983  . Cardiac catheterization      normal coronary arteries   Family History  Problem Relation Age of Onset  . Heart disease Sister     Good Pastures Syndrome  . Gout Father   . Lung cancer Father   . Breast cancer Paternal Aunt    History  Substance Use Topics  . Smoking status: Former Smoker -- 0.30 packs/day  for 44 years    Types: Cigarettes    Quit date: 04/07/2009  . Smokeless tobacco: Never Used  . Alcohol Use: Yes     Comment: 2 to 3 glasses wine per week   OB History   Grav Para Term Preterm Abortions TAB SAB Ect Mult Living                 Review of Systems  Constitutional: Negative for fever, activity change and appetite change.  HENT: Negative for rhinorrhea.   Respiratory: Positive for cough and shortness of breath. Negative for chest tightness.   Cardiovascular: Negative for chest pain.  Gastrointestinal: Positive for abdominal pain. Negative for nausea and vomiting.  Genitourinary: Negative for dysuria, vaginal bleeding and vaginal discharge.  Musculoskeletal: Positive for arthralgias, back pain and myalgias.  Neurological: Negative for dizziness and weakness.  A complete 10 system review of systems was obtained and all systems are negative except as noted in the HPI and PMH.    Allergies  Review of patient's allergies indicates no known allergies.  Home Medications   Current Outpatient Rx  Name  Route  Sig  Dispense  Refill  . albuterol (PROVENTIL HFA;VENTOLIN HFA) 108 (90 BASE) MCG/ACT inhaler   Inhalation   Inhale 2 puffs into the lungs every 6 (six) hours as needed. Shortness  of breath         . amiodarone (PACERONE) 400 MG tablet   Oral   Take 1 tablet (400 mg total) by mouth 2 (two) times daily.   40 tablet   0   . atorvastatin (LIPITOR) 40 MG tablet   Oral   Take 40 mg by mouth daily.           Marland Kitchen DM-Doxylamine-Acetaminophen (NYQUIL COLD & FLU PO)   Oral   Take 1 capsule by mouth once.         . Fluticasone-Salmeterol (ADVAIR) 100-50 MCG/DOSE AEPB   Inhalation   Inhale 1 puff into the lungs 2 (two) times daily.           . furosemide (LASIX) 20 MG tablet   Oral   Take 20 mg by mouth daily.         Marland Kitchen GARLIC PO   Oral   Take 1 tablet by mouth daily.         Marland Kitchen levothyroxine (SYNTHROID, LEVOTHROID) 88 MCG tablet   Oral   Take 88 mcg  by mouth daily.           . nitroGLYCERIN (NITROSTAT) 0.4 MG SL tablet   Sublingual   Place 0.4 mg under the tongue every 5 (five) minutes as needed for chest pain.         Marland Kitchen VITAMIN D, CHOLECALCIFEROL, PO   Oral   Take 2 tablets by mouth daily.         . Rivaroxaban (XARELTO) 20 MG TABS   Oral   Take 1 tablet (20 mg total) by mouth daily with supper.   30 tablet   0    BP 115/76  Pulse 64  Temp(Src) 97.8 F (36.6 C) (Oral)  Resp 20  SpO2 93% Physical Exam  Constitutional: She is oriented to person, place, and time. She appears well-developed and well-nourished. No distress.  HENT:  Head: Normocephalic and atraumatic.  Nose: Nose normal.  Mouth/Throat: Oropharynx is clear and moist. No oropharyngeal exudate.  Eyes: Conjunctivae and EOM are normal. Pupils are equal, round, and reactive to light.  Cardiovascular: Normal rate, regular rhythm, normal heart sounds and intact distal pulses.   No murmur heard. Pulmonary/Chest: Effort normal and breath sounds normal. No respiratory distress. She has no wheezes.  R paraspinal back tenderness  Abdominal: Soft. There is no tenderness. There is no rebound and no guarding.  Musculoskeletal: Normal range of motion. She exhibits no edema and no tenderness.  Neurological: She is alert and oriented to person, place, and time. No cranial nerve deficit. She exhibits normal muscle tone. Coordination normal.  CN 2-12 intact, no ataxia on finger to nose, no nystagmus, 5/5 strength throughout, no pronator drift, Romberg negative, normal gait.   Skin: Skin is warm.    ED Course  Procedures (including critical care time) Labs Review Labs Reviewed  CBC WITH DIFFERENTIAL - Abnormal; Notable for the following:    RDW 16.3 (*)    All other components within normal limits  COMPREHENSIVE METABOLIC PANEL - Abnormal; Notable for the following:    Creatinine, Ser 1.17 (*)    Total Protein 8.5 (*)    GFR calc non Af Amer 47 (*)    GFR calc Af  Amer 55 (*)    All other components within normal limits  TROPONIN I  D-DIMER, QUANTITATIVE  PROTIME-INR   Imaging Review Dg Chest 2 View  04/24/2013   CLINICAL DATA:  Right-sided back pain;  shortness of breath.  EXAM: CHEST  2 VIEW  COMPARISON:  Chest radiograph from 09/29/2011  FINDINGS: The lungs are well-aerated. Mild bibasilar airspace opacities may reflect mild pneumonia. Mild vascular congestion is seen. Biapical scarring is noted. There is no evidence of pleural effusion or pneumothorax.  The heart is enlarged.  No acute osseous abnormalities are seen.  IMPRESSION: 1. Mild bibasilar airspace opacities may reflect mild pneumonia. Interstitial edema could have a similar appearance. 2. Mild vascular congestion and cardiomegaly.   Electronically Signed   By: Garald Balding M.D.   On: 04/24/2013 23:26   Ct Angio Chest Aortic Dissect W &/or W/o  04/25/2013   CLINICAL DATA:  Back pain. Left upper back pain with increase shortness of Breath. Productive cough. Hypertension. COPD.  EXAM: CT ANGIOGRAPHY CHEST WITH CONTRAST  TECHNIQUE: Multidetector CT imaging of the chest was performed using the standard protocol during bolus administration of intravenous contrast. Multiplanar CT image reconstructions including MIPs were obtained to evaluate the vascular anatomy. Unenhanced images were also performed.  CONTRAST:  164m OMNIPAQUE IOHEXOL 350 MG/ML SOLN  COMPARISON:  DG CHEST 2 VIEW dated 04/24/2013  FINDINGS: Lungs/Pleura: Moderate centrilobular emphysema.  Minimal subsegmental atelectasis at the right lung base. Minimal left upper lobe ground-glass opacity and septal thickening.  No pleural fluid.  Heart/Mediastinum: No intramural hematoma on unenhanced imaging.  Aortic and branch vessel atherosclerosis. No evidence of aortic dissection or aneurysm. Moderate cardiomegaly with biatrial enlargement. Pulmonary artery enlargement, with the outflow tract measuring 3.5 cm. No central pulmonary embolism, on this  non-dedicated study.  Coronary artery atherosclerosis,  Right paratracheal node of 1.3 cm A right hilar node measures 1.2 cm. Prevascular nodes which measure up to 1.1 cm.  Upper abdomen: Contrast reflux into the IVC and hepatic veins, consistent with elevated right heart pressures. Colonic diverticulosis.  Bones/Musculoskeletal: Advanced thoracic spondylosis  Review of the MIP images confirms the above findings.  IMPRESSION: 1. Aortic atherosclerosis, without dissection or aneurysm. 2. Moderate cardiomegaly with biatrial enlargement and elevated right heart pressures. 3. Pulmonary artery enlargement suggests pulmonary arterial hypertension. No evidence of pulmonary embolism. 4. Thoracic adenopathy, mild. Favored to be reactive. Consider followup chest CT at 3- 6 months to confirm resolution. 5. Minimal left upper lobe ground-glass opacity, superimposed upon centrilobular emphysema. Question mild pulmonary edema.   Electronically Signed   By: KAbigail MiyamotoM.D.   On: 04/25/2013 01:24    EKG Interpretation   None       MDM   1. Hypoxia   2. Dyspnea    Shortness of breath for the past one day with acute onset of right upper back pain for the past hour. No chest pain. No hypoxia. Lungs clear bilaterally.  CXR with possible PNA or edema.  No PTX. EKG unchanged. Troponin negative. Pain improved with medication.  Seems musculoskeletal as worse with palpation and arm movement. Equal pulses and grip strengths.  On attempted ambulation, pulse ox down to 80%.  No wheezing.  Doubt PE as on xarelto. Treat for COPD.  No evidence of dissection.   Date: 04/24/2013  Rate: 71  Rhythm: normal sinus rhythm  QRS Axis: normal  Intervals: normal  ST/T Wave abnormalities: nonspecific ST/T changes  Conduction Disutrbances:none  Narrative Interpretation: T wave inversions in inferior lateral leads that are similar to previous  Old EKG Reviewed: unchanged    SEzequiel Essex MD 04/25/13 [phone]

## 2013-04-24 NOTE — ED Notes (Signed)
Attempted to get bloodwork, this nurse unsuccessful.

## 2013-04-24 NOTE — ED Notes (Signed)
Pt oxygen levels dropped down to 80% while ambulating in the hallway.

## 2013-04-24 NOTE — ED Notes (Signed)
Second EMT unable to obtain blood.

## 2013-04-25 ENCOUNTER — Encounter (HOSPITAL_COMMUNITY): Payer: Self-pay | Admitting: Radiology

## 2013-04-25 ENCOUNTER — Emergency Department (HOSPITAL_COMMUNITY): Payer: Medicare Other

## 2013-04-25 DIAGNOSIS — I10 Essential (primary) hypertension: Secondary | ICD-10-CM | POA: Diagnosis not present

## 2013-04-25 DIAGNOSIS — J189 Pneumonia, unspecified organism: Secondary | ICD-10-CM | POA: Diagnosis not present

## 2013-04-25 DIAGNOSIS — I422 Other hypertrophic cardiomyopathy: Secondary | ICD-10-CM | POA: Diagnosis not present

## 2013-04-25 DIAGNOSIS — R0902 Hypoxemia: Secondary | ICD-10-CM

## 2013-04-25 DIAGNOSIS — I5032 Chronic diastolic (congestive) heart failure: Secondary | ICD-10-CM | POA: Diagnosis not present

## 2013-04-25 DIAGNOSIS — N179 Acute kidney failure, unspecified: Secondary | ICD-10-CM | POA: Diagnosis not present

## 2013-04-25 DIAGNOSIS — I059 Rheumatic mitral valve disease, unspecified: Secondary | ICD-10-CM | POA: Diagnosis not present

## 2013-04-25 DIAGNOSIS — I7090 Unspecified atherosclerosis: Secondary | ICD-10-CM | POA: Diagnosis not present

## 2013-04-25 LAB — D-DIMER, QUANTITATIVE (NOT AT ARMC): D DIMER QUANT: 0.42 ug{FEU}/mL (ref 0.00–0.48)

## 2013-04-25 LAB — COMPREHENSIVE METABOLIC PANEL
ALT: 15 U/L (ref 0–35)
AST: 14 U/L (ref 0–37)
Albumin: 3.2 g/dL — ABNORMAL LOW (ref 3.5–5.2)
Alkaline Phosphatase: 85 U/L (ref 39–117)
BUN: 15 mg/dL (ref 6–23)
CO2: 23 mEq/L (ref 19–32)
Calcium: 8.6 mg/dL (ref 8.4–10.5)
Chloride: 103 mEq/L (ref 96–112)
Creatinine, Ser: 1.37 mg/dL — ABNORMAL HIGH (ref 0.50–1.10)
GFR calc non Af Amer: 39 mL/min — ABNORMAL LOW (ref >90)
GFR, EST AFRICAN AMERICAN: 45 mL/min — AB (ref >90)
GLUCOSE: 101 mg/dL — AB (ref 70–99)
Potassium: 4.2 mEq/L (ref 3.7–5.3)
Sodium: 140 mEq/L (ref 137–147)
Total Bilirubin: 0.2 mg/dL — ABNORMAL LOW (ref 0.3–1.2)
Total Protein: 7.3 g/dL (ref 6.0–8.3)

## 2013-04-25 LAB — CBC WITH DIFFERENTIAL/PLATELET
Basophils Absolute: 0 10*3/uL (ref 0.0–0.1)
Basophils Relative: 0 % (ref 0–1)
EOS PCT: 1 % (ref 0–5)
Eosinophils Absolute: 0.1 10*3/uL (ref 0.0–0.7)
HEMATOCRIT: 34.9 % — AB (ref 36.0–46.0)
Hemoglobin: 11.1 g/dL — ABNORMAL LOW (ref 12.0–15.0)
LYMPHS ABS: 1 10*3/uL (ref 0.7–4.0)
Lymphocytes Relative: 11 % — ABNORMAL LOW (ref 12–46)
MCH: 28.5 pg (ref 26.0–34.0)
MCHC: 31.8 g/dL (ref 30.0–36.0)
MCV: 89.5 fL (ref 78.0–100.0)
Monocytes Absolute: 1.3 10*3/uL — ABNORMAL HIGH (ref 0.1–1.0)
Monocytes Relative: 14 % — ABNORMAL HIGH (ref 3–12)
NEUTROS ABS: 6.7 10*3/uL (ref 1.7–7.7)
Neutrophils Relative %: 73 % (ref 43–77)
PLATELETS: 229 10*3/uL (ref 150–400)
RBC: 3.9 MIL/uL (ref 3.87–5.11)
RDW: 16.7 % — AB (ref 11.5–15.5)
WBC: 9.1 10*3/uL (ref 4.0–10.5)

## 2013-04-25 LAB — PROTIME-INR
INR: 1.11 (ref 0.00–1.49)
Prothrombin Time: 14.1 seconds (ref 11.6–15.2)

## 2013-04-25 LAB — STREP PNEUMONIAE URINARY ANTIGEN: Strep Pneumo Urinary Antigen: NEGATIVE

## 2013-04-25 MED ORDER — RIVAROXABAN 20 MG PO TABS
20.0000 mg | ORAL_TABLET | Freq: Every day | ORAL | Status: DC
  Administered 2013-04-25: 20 mg via ORAL
  Filled 2013-04-25 (×2): qty 1

## 2013-04-25 MED ORDER — DEXTROSE 5 % IV SOLN
1.0000 g | INTRAVENOUS | Status: DC
  Administered 2013-04-25: 1 g via INTRAVENOUS
  Filled 2013-04-25 (×2): qty 10

## 2013-04-25 MED ORDER — MOMETASONE FURO-FORMOTEROL FUM 100-5 MCG/ACT IN AERO
2.0000 | INHALATION_SPRAY | Freq: Two times a day (BID) | RESPIRATORY_TRACT | Status: DC
  Administered 2013-04-25 – 2013-04-26 (×3): 2 via RESPIRATORY_TRACT
  Filled 2013-04-25: qty 8.8

## 2013-04-25 MED ORDER — DEXTROSE 5 % IV SOLN
500.0000 mg | INTRAVENOUS | Status: DC
  Administered 2013-04-25: 500 mg via INTRAVENOUS
  Filled 2013-04-25 (×2): qty 500

## 2013-04-25 MED ORDER — IPRATROPIUM BROMIDE 0.02 % IN SOLN
0.5000 mg | Freq: Four times a day (QID) | RESPIRATORY_TRACT | Status: DC
  Filled 2013-04-25: qty 2.5

## 2013-04-25 MED ORDER — MORPHINE SULFATE 2 MG/ML IJ SOLN: 2.0000 mg | INTRAMUSCULAR | Status: DC | PRN

## 2013-04-25 MED ORDER — SODIUM CHLORIDE 0.9 % IV BOLUS (SEPSIS)
500.0000 mL | Freq: Once | INTRAVENOUS | Status: AC
Start: 2013-04-25 — End: 2013-04-25
  Administered 2013-04-25: 500 mL via INTRAVENOUS

## 2013-04-25 MED ORDER — AMIODARONE HCL 200 MG PO TABS
200.0000 mg | ORAL_TABLET | Freq: Two times a day (BID) | ORAL | Status: DC
  Administered 2013-04-25 – 2013-04-26 (×3): 200 mg via ORAL
  Filled 2013-04-25 (×4): qty 1

## 2013-04-25 MED ORDER — ALBUTEROL SULFATE HFA 108 (90 BASE) MCG/ACT IN AERS: 2.0000 | INHALATION_SPRAY | RESPIRATORY_TRACT | Status: DC | PRN

## 2013-04-25 MED ORDER — LEVOTHYROXINE SODIUM 88 MCG PO TABS
88.0000 ug | ORAL_TABLET | Freq: Every day | ORAL | Status: DC
  Administered 2013-04-25 – 2013-04-26 (×2): 88 ug via ORAL
  Filled 2013-04-25 (×3): qty 1

## 2013-04-25 MED ORDER — ALBUTEROL SULFATE (2.5 MG/3ML) 0.083% IN NEBU: 2.5000 mg | INHALATION_SOLUTION | RESPIRATORY_TRACT | Status: DC | PRN

## 2013-04-25 MED ORDER — ATORVASTATIN CALCIUM 40 MG PO TABS
40.0000 mg | ORAL_TABLET | Freq: Every day | ORAL | Status: DC
  Administered 2013-04-25 – 2013-04-26 (×2): 40 mg via ORAL
  Filled 2013-04-25 (×2): qty 1

## 2013-04-25 MED ORDER — SODIUM CHLORIDE 0.9 % IJ SOLN
3.0000 mL | Freq: Two times a day (BID) | INTRAMUSCULAR | Status: DC
  Administered 2013-04-25: 3 mL via INTRAVENOUS

## 2013-04-25 MED ORDER — IOHEXOL 350 MG/ML SOLN
100.0000 mL | Freq: Once | INTRAVENOUS | Status: AC | PRN
Start: 2013-04-25 — End: 2013-04-25
  Administered 2013-04-25: 100 mL via INTRAVENOUS

## 2013-04-25 MED ORDER — SODIUM CHLORIDE 0.9 % IJ SOLN
3.0000 mL | INTRAMUSCULAR | Status: DC | PRN
  Administered 2013-04-25: 3 mL via INTRAVENOUS

## 2013-04-25 MED ORDER — SODIUM CHLORIDE 0.9 % IV SOLN: 250.0000 mL | INTRAVENOUS | Status: DC | PRN

## 2013-04-25 MED ORDER — IPRATROPIUM-ALBUTEROL 0.5-2.5 (3) MG/3ML IN SOLN: 3.0000 mL | RESPIRATORY_TRACT | Status: DC | PRN

## 2013-04-25 MED ORDER — FUROSEMIDE 20 MG PO TABS
20.0000 mg | ORAL_TABLET | Freq: Every day | ORAL | Status: DC
Start: 2013-04-25 — End: 2013-04-25
  Administered 2013-04-25: 20 mg via ORAL
  Filled 2013-04-25: qty 1

## 2013-04-25 NOTE — ED Provider Notes (Signed)
Procedure note: Ultrasound Guided Peripheral IV Ultrasound guided 18 g peripheral 1.88 inch angiocath IV placement performed by me. Indications: Nursing unable to place IV. Details: The right antecubital fossa and upper arm was evaluated with a multifrequency linear probe. Several patent brachial veins are noted. 1 attempts were made to cannulate a brachiocephalic vein under realtime US guidance with successful cannulation of the vein and catheter placement. There is return of non-pulsatile dark red blood. The patient tolerated the procedure well without complications. An ultrasound image is not archived.   Deno Etienne, MD 04/25/13 [phone]

## 2013-04-25 NOTE — ED Provider Notes (Signed)
I saw and evaluated the patient, reviewed the resident's note and I agree with the findings and plan. If applicable, I agree with the resident's interpretation of the EKG.  If applicable, I was present for critical portions of any procedures performed.    Ezequiel Essex, MD 04/25/13 [phone]

## 2013-04-25 NOTE — Progress Notes (Signed)
  Echocardiogram 2D Echocardiogram has been performed.  Amy [name] FRANCES 04/25/2013, 12:54 PM

## 2013-04-25 NOTE — H&P (Signed)
PCP:  [name], MD [facility]    Chief Complaint:  Back pain  HPI: Amy [name] is a 68 y.o. female   has a past medical history of Hypertension; Hyperlipidemia; Hypothyroid; COPD (chronic obstructive pulmonary disease); Dizziness; Lumbar disc disease; Macular degeneration; Chronic diastolic heart failure; Apical variant hypertrophic cardiomyopathy; GERD (gastroesophageal reflux disease); Paroxysmal atrial fibrillation/flutter; and Sinus bradycardia.   Presented with  Patient have had some cough and shortness of breath that ws worse over the past 1 week. She was at the party tonight. She went to the bathroom and had a sudden onset of severe back pain. That is worse with movement and palpation. Patient was noted to be hypoxic in emergency department, and had a CTA done that showed no PE and neg for dissection. CT showed a small area of ground glass opacity but not corresponding to the area that was painful. Upon my personal review of CAT scan an area of right lower lobe infiltrate was noted.   Patietn on Xarelto for hx of A.fib.  Patient has hx of OSA and uses CPAP at night.  Hospital was called for admission for new hypoxia and pneumonia   Review of Systems:    Pertinent positives include: back pain, dyspnea on exertion, non-productive cough,   Constitutional:  No weight loss, night sweats, Fevers, chills, fatigue, weight loss  HEENT:  No headaches, Difficulty swallowing,Tooth/dental problems,Sore throat,  No sneezing, itching, ear ache, nasal congestion, post nasal drip,  Cardio-vascular:  No chest pain, Orthopnea, PND, anasarca, dizziness, palpitations.no Bilateral lower extremity swelling  GI:  No heartburn, indigestion, abdominal pain, nausea, vomiting, diarrhea, change in bowel habits, loss of appetite, melena, blood in stool, hematemesis Resp:  no shortness of breath at rest.  No excess mucus, no productive cough, No No coughing up of blood.No  change in color of mucus.No wheezing. Skin:  no rash or lesions. No jaundice GU:  no dysuria, change in color of urine, no urgency or frequency. No straining to urinate.  No flank pain.  Musculoskeletal:  No joint pain or no joint swelling. No decreased range of motion. No back pain.  Psych:  No change in mood or affect. No depression or anxiety. No memory loss.  Neuro: no localizing neurological complaints, no tingling, no weakness, no double vision, no gait abnormality, no slurred speech, no confusion  Otherwise ROS are negative except for above, 10 systems were reviewed  Past Medical History: Past Medical History  Diagnosis Date  . Hypertension   . Hyperlipidemia   . Hypothyroid   . COPD (chronic obstructive pulmonary disease)   . Dizziness     had PT for being off balance - in approx. 2012  . Lumbar disc disease   . Macular degeneration   . Chronic diastolic heart failure   . Apical variant hypertrophic cardiomyopathy     Diagnosed by echo and ECG 11/13  . GERD (gastroesophageal reflux disease)   . Paroxysmal atrial fibrillation/flutter        . Sinus bradycardia    Past Surgical History  Procedure Laterality Date  . Cesarean section  1983  . Cardiac catheterization      normal coronary arteries     Medications: Prior to Admission medications   Medication Sig Start Date End Date Taking? Authorizing Provider  albuterol (PROVENTIL HFA;VENTOLIN HFA) 108 (90 BASE) MCG/ACT inhaler Inhale 2 puffs into the lungs every 6 (six) hours as needed. Shortness of breath   Yes Historical Provider, MD  amiodarone (PACERONE)  400 MG tablet Take 200 mg by mouth 2 (two) times daily. 03/08/12  Yes Geradine Girt, DO  atorvastatin (LIPITOR) 40 MG tablet Take 40 mg by mouth daily.     Yes Historical Provider, MD  DM-Doxylamine-Acetaminophen (NYQUIL COLD & FLU PO) Take 1 capsule by mouth once.   Yes Historical Provider, MD  Fluticasone-Salmeterol (ADVAIR) 100-50 MCG/DOSE AEPB Inhale 1 puff  into the lungs 2 (two) times daily.     Yes Historical Provider, MD  furosemide (LASIX) 20 MG tablet Take 20 mg by mouth daily.   Yes Historical Provider, MD  GARLIC PO Take 1 tablet by mouth daily.   Yes Historical Provider, MD  levothyroxine (SYNTHROID, LEVOTHROID) 88 MCG tablet Take 88 mcg by mouth daily.     Yes Historical Provider, MD  nitroGLYCERIN (NITROSTAT) 0.4 MG SL tablet Place 0.4 mg under the tongue every 5 (five) minutes as needed for chest pain.   Yes Historical Provider, MD  VITAMIN D, CHOLECALCIFEROL, PO Take 2 tablets by mouth daily.   Yes Historical Provider, MD  Rivaroxaban (XARELTO) 20 MG TABS Take 1 tablet (20 mg total) by mouth daily with supper. 03/08/12   Geradine Girt, DO    Allergies:  No Known Allergies  Social History:  Ambulatory  Independently  Lives at   Home with family   reports that she quit smoking about 4 years ago. Her smoking use included Cigarettes. She has a 13.2 pack-year smoking history. She has never used smokeless tobacco. She reports that she drinks alcohol. She reports that she does not use illicit drugs.   Family History: family history includes Breast cancer in her paternal aunt; Gout in her father; Heart disease in her sister; Lung cancer in her father.    Physical Exam: Patient Vitals for the past 24 hrs:  BP Temp Temp src Pulse Resp SpO2  04/25/13 0124 115/76 mmHg - - 64 20 93 %  04/25/13 0018 135/55 mmHg - - 56 - 96 %  04/24/13 2257 129/66 mmHg 97.8 F (36.6 C) Oral 58 20 93 %  04/24/13 2230 133/111 mmHg - - 57 18 93 %  04/24/13 2134 152/74 mmHg - - - - -  04/24/13 2134 161/70 mmHg - - - - -  04/24/13 2129 - - - - - 94 %  04/24/13 2127 148/97 mmHg 98.1 F (36.7 C) Oral 71 24 94 %    1. General:  in No Acute distress 2. Psychological: Alert and   Oriented 3. Head/ENT:   Moist Mucous Membranes                          Head Non traumatic, neck supple                          Normal  Dentition 4. SKIN: normal   Skin turgor,   Skin clean Dry and intact no rash 5. Heart: Regular rate and rhythm no Murmur, Rub or gallop no lift was appreciated 6. Lungs: Coarse breath sound no wheezes occasional crackles   7. Abdomen: Soft, non-tender, Non distended 8. Lower extremities: no clubbing, cyanosis, or edema 9. Neurologically Grossly intact, moving all 4 extremities equally 10. MSK: Normal range of motion  body mass index is unknown because there is no weight on file.   Labs on Admission:   Recent Labs  04/24/13 2320  NA 140  K 4.6  CL 103  CO2  23  GLUCOSE 92  BUN 14  CREATININE 1.17*  CALCIUM 9.0    Recent Labs  04/24/13 2320  AST 23  ALT 20  ALKPHOS 93  BILITOT 0.3  PROT 8.5*  ALBUMIN 3.8   No results found for this basename: LIPASE, AMYLASE,  in the last 72 hours  Recent Labs  04/24/13 2320  WBC 9.3  NEUTROABS 6.9  HGB 12.4  HCT 37.7  MCV 89.5  PLT 270    Recent Labs  04/24/13 2320  TROPONINI <0.30   No results found for this basename: TSH, T4TOTAL, FREET3, T3FREE, THYROIDAB,  in the last 72 hours No results found for this basename: VITAMINB12, FOLATE, FERRITIN, TIBC, IRON, RETICCTPCT,  in the last 72 hours No results found for this basename: HGBA1C    The CrCl is unknown because both a height and weight (above a minimum accepted value) are required for this calculation. ABG    Component Value Date/Time   TCO2 24 09/29/2011 1609     Lab Results  Component Value Date   DDIMER 0.51* 09/29/2011     Other results:  I have pearsonaly reviewed this: ECG REPORT  Rate: 73  Rhythm: Normal sinus rhythm ST&T Change: T wave inversions in leads V3 V4 V5 V6   Cultures: No results found for this basename: sdes, specrequest, cult, reptstatus       Radiological Exams on Admission: Dg Chest 2 View  04/24/2013   CLINICAL DATA:  Right-sided back pain; shortness of breath.  EXAM: CHEST  2 VIEW  COMPARISON:  Chest radiograph from 09/29/2011  FINDINGS: The lungs are well-aerated.  Mild bibasilar airspace opacities may reflect mild pneumonia. Mild vascular congestion is seen. Biapical scarring is noted. There is no evidence of pleural effusion or pneumothorax.  The heart is enlarged.  No acute osseous abnormalities are seen.  IMPRESSION: 1. Mild bibasilar airspace opacities may reflect mild pneumonia. Interstitial edema could have a similar appearance. 2. Mild vascular congestion and cardiomegaly.   Electronically Signed   By: Garald Balding M.D.   On: 04/24/2013 23:26   Ct Angio Chest Aortic Dissect W &/or W/o  04/25/2013   CLINICAL DATA:  Back pain. Left upper back pain with increase shortness of Breath. Productive cough. Hypertension. COPD.  EXAM: CT ANGIOGRAPHY CHEST WITH CONTRAST  TECHNIQUE: Multidetector CT imaging of the chest was performed using the standard protocol during bolus administration of intravenous contrast. Multiplanar CT image reconstructions including MIPs were obtained to evaluate the vascular anatomy. Unenhanced images were also performed.  CONTRAST:  141m OMNIPAQUE IOHEXOL 350 MG/ML SOLN  COMPARISON:  DG CHEST 2 VIEW dated 04/24/2013  FINDINGS: Lungs/Pleura: Moderate centrilobular emphysema.  Minimal subsegmental atelectasis at the right lung base. Minimal left upper lobe ground-glass opacity and septal thickening.  No pleural fluid.  Heart/Mediastinum: No intramural hematoma on unenhanced imaging.  Aortic and branch vessel atherosclerosis. No evidence of aortic dissection or aneurysm. Moderate cardiomegaly with biatrial enlargement. Pulmonary artery enlargement, with the outflow tract measuring 3.5 cm. No central pulmonary embolism, on this non-dedicated study.  Coronary artery atherosclerosis,  Right paratracheal node of 1.3 cm A right hilar node measures 1.2 cm. Prevascular nodes which measure up to 1.1 cm.  Upper abdomen: Contrast reflux into the IVC and hepatic veins, consistent with elevated right heart pressures. Colonic diverticulosis.   Bones/Musculoskeletal: Advanced thoracic spondylosis  Review of the MIP images confirms the above findings.  IMPRESSION: 1. Aortic atherosclerosis, without dissection or aneurysm. 2. Moderate cardiomegaly with biatrial enlargement and  elevated right heart pressures. 3. Pulmonary artery enlargement suggests pulmonary arterial hypertension. No evidence of pulmonary embolism. 4. Thoracic adenopathy, mild. Favored to be reactive. Consider followup chest CT at 3- 6 months to confirm resolution. 5. Minimal left upper lobe ground-glass opacity, superimposed upon centrilobular emphysema. Question mild pulmonary edema.   Electronically Signed   By: Abigail Miyamoto M.D.   On: 04/25/2013 01:24    Chart has been reviewed  Assessment/Plan  68 year old female with history of CHF, coronary disease, COPD here with community-acquired pneumonia associated with  back pain which is pleuritic   Present on Admission:  . CAP (community acquired pneumonia) - CT scan worrisome for pneumonia will call with Rocephin and Zithromax for now. Continue oxygen and inhalers would recommend repeat imaging to document clearance  . Chronic diastolic heart failure - continue Lasix  . COPD (chronic obstructive pulmonary disease) - stable continue home nebulizer treatments  . Apical variant hypertrophic cardiomyopathy - given worsening shortness of breath will check echo gram  . HTN (hypertension) - continue home medications  . Paroxysmal atrial fibrillation/flutter - continue warfarin and xarelto  . Hypoxia - oxygen, will evaluate for pulmonary hypertension she may benefit from home oxygen   sleep apnea - home CPAP   Prophylaxis: xarelto, Protonix  CODE STATUS: FULL CODE  Other plan as per orders.  I have spent a total of 55 min on this admission  Stefan Markarian 04/25/2013, 2:09 AM

## 2013-04-25 NOTE — Progress Notes (Signed)
[facility] PROGRESS NOTE  Amy [name] UXN:[id number] DOB: 1945/09/02 DOA: 04/24/2013 PCP: [name], MD  Assessment/Plan: 1. Community acquire pneumonia. Patient presenting with clinical signs and symptoms consistent with pneumonia, chest x-ray showing mild bibasilar airspace opacities. Continue empiric IV antibiotic therapy with ceftriaxone and azithromycin. 2. Acute on chronic renal failure. Patient initially presented with a creatinine 1.17, going up to 1.37 on this mornings lab work. He think this is likely reflective of prerenal azotemia/hypovolemia. Will hold Lasix therapy for today, bolused with 500 mL's of normal saline follow her volume status, repeat BMP in AM.  3. Chronic diastolic congestive heart failure. Holding Lasix today given rising creatinine, bolused with 500 mils of normal saline, monitor closely for fluid overload. 4. Dyslipidemia. Continue Lipitor  Code Status: Full Code Disposition Plan: Continue IV antibiotic therapy   Antibiotics:  Azithromycin (started on 04/25/2013)  Ceftriaxone (started on 04/25/2013)  HPI/Subjective: Patient is a pleasant 68 year old female with a past medical history of hypertension, dyslipidemia, chronic obstructive pulmonary disease, who presented to the emergency department on 04/25/2013 with complaints of increasing cough, shortness of breath, congestion as well as pleuritic type chest pain. Initial chest x-ray showed mild bibasilar airspace opacities that could reflect pneumonia. She was started on empiric IV antibiotic therapy with azithromycin and ceftriaxone, admitted to telemetry. This morning she reports feeling about the same, however is ambulating around her room, tolerating by mouth intake, remained to telemetry stable.  Objective: Filed Vitals:   04/25/13 1355  BP: 137/66  Pulse: 59  Temp: 98.3 F (36.8 C)  Resp: 18    Intake/Output Summary (Last 24 hours) at 04/25/13 1507 Last data filed at  04/25/13 1300  Gross per 24 hour  Intake    480 ml  Output      0 ml  Net    480 ml   Filed Weights   04/25/13 0330  Weight: 95.9 kg (211 lb 6.7 oz)    Exam:   General:  Patient reports feeling about the same, however she got out of bed and landed on her room  Cardiovascular: Regular rate and rhythm normal S1 is S2  Respiratory: Having by basilar crackles, normal inspiratory effort  Abdomen: Soft nontender nondistended  Musculoskeletal: No edema  Data Reviewed: Basic Metabolic Panel:  Recent Labs Lab 04/24/13 2320 04/25/13 0520  NA 140 140  K 4.6 4.2  CL 103 103  CO2 23 23  GLUCOSE 92 101*  BUN 14 15  CREATININE 1.17* 1.37*  CALCIUM 9.0 8.6   Liver Function Tests:  Recent Labs Lab 04/24/13 2320 04/25/13 0520  AST 23 14  ALT 20 15  ALKPHOS 93 85  BILITOT 0.3 0.2*  PROT 8.5* 7.3  ALBUMIN 3.8 3.2*   No results found for this basename: LIPASE, AMYLASE,  in the last 168 hours No results found for this basename: AMMONIA,  in the last 168 hours CBC:  Recent Labs Lab 04/24/13 2320 04/25/13 0520  WBC 9.3 9.1  NEUTROABS 6.9 6.7  HGB 12.4 11.1*  HCT 37.7 34.9*  MCV 89.5 89.5  PLT 270 229   Cardiac Enzymes:  Recent Labs Lab 04/24/13 2320  TROPONINI <0.30   BNP (last 3 results)  Recent Labs  04/13/13 0921  PROBNP 127.0*   CBG: No results found for this basename: GLUCAP,  in the last 168 hours  No results found for this or any previous visit (from the past 240 hour(s)).   Studies: Dg Chest 2 View  04/24/2013   CLINICAL  DATA:  Right-sided back pain; shortness of breath.  EXAM: CHEST  2 VIEW  COMPARISON:  Chest radiograph from 09/29/2011  FINDINGS: The lungs are well-aerated. Mild bibasilar airspace opacities may reflect mild pneumonia. Mild vascular congestion is seen. Biapical scarring is noted. There is no evidence of pleural effusion or pneumothorax.  The heart is enlarged.  No acute osseous abnormalities are seen.  IMPRESSION: 1. Mild  bibasilar airspace opacities may reflect mild pneumonia. Interstitial edema could have a similar appearance. 2. Mild vascular congestion and cardiomegaly.   Electronically Signed   By: Garald Balding M.D.   On: 04/24/2013 23:26   Ct Angio Chest Aortic Dissect W &/or W/o  04/25/2013   ADDENDUM REPORT: 04/25/2013 03:59  ADDENDUM: Mild focal airspace opacity is noted at the posterior aspect of the right lower lobe; this raises concern for mild pneumonia, given focal pleuritic symptoms at this location. The overlying ribs appear intact.   Electronically Signed   By: Garald Balding M.D.   On: 04/25/2013 03:59   04/25/2013   CLINICAL DATA:  Back pain. Left upper back pain with increase shortness of Breath. Productive cough. Hypertension. COPD.  EXAM: CT ANGIOGRAPHY CHEST WITH CONTRAST  TECHNIQUE: Multidetector CT imaging of the chest was performed using the standard protocol during bolus administration of intravenous contrast. Multiplanar CT image reconstructions including MIPs were obtained to evaluate the vascular anatomy. Unenhanced images were also performed.  CONTRAST:  172m OMNIPAQUE IOHEXOL 350 MG/ML SOLN  COMPARISON:  DG CHEST 2 VIEW dated 04/24/2013  FINDINGS: Lungs/Pleura: Moderate centrilobular emphysema.  Minimal subsegmental atelectasis at the right lung base. Minimal left upper lobe ground-glass opacity and septal thickening.  No pleural fluid.  Heart/Mediastinum: No intramural hematoma on unenhanced imaging.  Aortic and branch vessel atherosclerosis. No evidence of aortic dissection or aneurysm. Moderate cardiomegaly with biatrial enlargement. Pulmonary artery enlargement, with the outflow tract measuring 3.5 cm. No central pulmonary embolism, on this non-dedicated study.  Coronary artery atherosclerosis,  Right paratracheal node of 1.3 cm A right hilar node measures 1.2 cm. Prevascular nodes which measure up to 1.1 cm.  Upper abdomen: Contrast reflux into the IVC and hepatic veins, consistent with  elevated right heart pressures. Colonic diverticulosis.  Bones/Musculoskeletal: Advanced thoracic spondylosis  Review of the MIP images confirms the above findings.  IMPRESSION: 1. Aortic atherosclerosis, without dissection or aneurysm. 2. Moderate cardiomegaly with biatrial enlargement and elevated right heart pressures. 3. Pulmonary artery enlargement suggests pulmonary arterial hypertension. No evidence of pulmonary embolism. 4. Thoracic adenopathy, mild. Favored to be reactive. Consider followup chest CT at 3- 6 months to confirm resolution. 5. Minimal left upper lobe ground-glass opacity, superimposed upon centrilobular emphysema. Question mild pulmonary edema.  Electronically Signed: By: KAbigail MiyamotoM.D. On: 04/25/2013 01:24    Scheduled Meds: . amiodarone  200 mg Oral BID  . atorvastatin  40 mg Oral Daily  . azithromycin  500 mg Intravenous Q24H  . cefTRIAXone (ROCEPHIN)  IV  1 g Intravenous Q24H  . levothyroxine  88 mcg Oral QAC breakfast  . mometasone-formoterol  2 puff Inhalation BID  . Rivaroxaban  20 mg Oral Q supper  . sodium chloride  500 mL Intravenous Once   Continuous Infusions:   Active Problems:   DOE (dyspnea on exertion)   COPD (chronic obstructive pulmonary disease)   HTN (hypertension)   Apical variant hypertrophic cardiomyopathy   Chronic diastolic heart failure   Paroxysmal atrial fibrillation/flutter   Hypoxia   CAP (community acquired pneumonia)  Time spent: 35 minutes    Kelvin Cellar  [facility] Pager [phone]. If 7PM-7AM, please contact night-coverage at [email or web address], password [facility] 04/25/2013, 3:07 PM  LOS: 1 day

## 2013-04-25 NOTE — ED Notes (Signed)
Pt transported to CT

## 2013-04-26 DIAGNOSIS — R0902 Hypoxemia: Secondary | ICD-10-CM | POA: Diagnosis not present

## 2013-04-26 DIAGNOSIS — I10 Essential (primary) hypertension: Secondary | ICD-10-CM | POA: Diagnosis not present

## 2013-04-26 DIAGNOSIS — I5032 Chronic diastolic (congestive) heart failure: Secondary | ICD-10-CM | POA: Diagnosis not present

## 2013-04-26 DIAGNOSIS — I4891 Unspecified atrial fibrillation: Secondary | ICD-10-CM

## 2013-04-26 DIAGNOSIS — J189 Pneumonia, unspecified organism: Secondary | ICD-10-CM | POA: Diagnosis not present

## 2013-04-26 LAB — BASIC METABOLIC PANEL
BUN: 16 mg/dL (ref 6–23)
CALCIUM: 8.6 mg/dL (ref 8.4–10.5)
CO2: 23 meq/L (ref 19–32)
Chloride: 102 mEq/L (ref 96–112)
Creatinine, Ser: 1.29 mg/dL — ABNORMAL HIGH (ref 0.50–1.10)
GFR calc Af Amer: 49 mL/min — ABNORMAL LOW (ref >90)
GFR calc non Af Amer: 42 mL/min — ABNORMAL LOW (ref >90)
GLUCOSE: 86 mg/dL (ref 70–99)
Potassium: 4.6 mEq/L (ref 3.7–5.3)
Sodium: 138 mEq/L (ref 137–147)

## 2013-04-26 LAB — LEGIONELLA ANTIGEN, URINE: Legionella Antigen, Urine: NEGATIVE

## 2013-04-26 MED ORDER — PNEUMOCOCCAL VAC POLYVALENT 25 MCG/0.5ML IJ INJ
0.5000 mL | INJECTION | Freq: Once | INTRAMUSCULAR | Status: AC
  Administered 2013-04-26: 0.5 mL via INTRAMUSCULAR
  Filled 2013-04-26: qty 0.5

## 2013-04-26 MED ORDER — AZITHROMYCIN 500 MG PO TABS
500.0000 mg | ORAL_TABLET | Freq: Every day | ORAL | Status: DC
  Filled 2013-04-26: qty 1

## 2013-04-26 MED ORDER — LEVOFLOXACIN 750 MG PO TABS: 750.0000 mg | ORAL_TABLET | Freq: Every day | ORAL | Status: DC

## 2013-04-26 NOTE — Discharge Summary (Signed)
Physician Discharge Summary  Amy [name] NWG:[id number] DOB: 03-17-1946 DOA: 04/24/2013  PCP: [name], MD  Admit date: 04/24/2013 Discharge date: 04/26/2013  Time spent: 35 minutes  Recommendations for Outpatient Follow-up:  1. Follow up on CBC in 1 week  Discharge Diagnoses:  Active Problems:   DOE (dyspnea on exertion)   COPD (chronic obstructive pulmonary disease)   HTN (hypertension)   Apical variant hypertrophic cardiomyopathy   Chronic diastolic heart failure   Paroxysmal atrial fibrillation/flutter   Hypoxia   CAP (community acquired pneumonia)   Discharge Condition: Stable  Diet recommendation: Heart Healthy  Filed Weights   04/25/13 0330 04/26/13 0534  Weight: 95.9 kg (211 lb 6.7 oz) 96.8 kg (213 lb 6.5 oz)    History of present illness:  Amy [name] is a 68 y.o. female  has a past medical history of Hypertension; Hyperlipidemia; Hypothyroid; COPD (chronic obstructive pulmonary disease); Dizziness; Lumbar disc disease; Macular degeneration; Chronic diastolic heart failure; Apical variant hypertrophic cardiomyopathy; GERD (gastroesophageal reflux disease); Paroxysmal atrial fibrillation/flutter; and Sinus bradycardia.  Presented with  Patient have had some cough and shortness of breath that ws worse over the past 1 week. She was at the party tonight. She went to the bathroom and had a sudden onset of severe back pain. That is worse with movement and palpation. Patient was noted to be hypoxic in emergency department, and had a CTA done that showed no PE and neg for dissection. CT showed a small area of ground glass opacity but not corresponding to the area that was painful. Upon my personal review of CAT scan an area of right lower lobe infiltrate was noted.  Patietn on Xarelto for hx of A.fib.  Patient has hx of OSA and uses CPAP at night.  Hospital was called for admission for new hypoxia and pneumonia   Hospital Course:  Patient is a  pleasant 68 year old female with a past medical history of hypertension, dyslipidemia, chronic obstructive pulmonary disease, who presented to the emergency department on 04/25/2013 with complaints of increasing cough, shortness of breath, congestion as well as pleuritic type chest pain. Initial chest x-ray showed mild bibasilar airspace opacities that could reflect pneumonia. She was started on empiric IV antibiotic therapy with azithromycin and ceftriaxone, admitted to telemetry.Patient initially presented with a creatinine 1.17, going up to 1.37 on 04/26/2013. This is likely reflective of prerenal azotemia/hypovolemia. As lasix was held and bolused with 500 mL's of normal saline. Labs improving by the following day as creatinine improved to  1.17. Her respiratory status improved, and by 1/201/5 she did not require supplemental oxygen, appeared nontoxic, tolerating PO and had been afebrile for 48 hours. She was discharged in stable condition to her home.    Discharge Exam: Filed Vitals:   04/26/13 0534  BP: 109/55  Pulse: 59  Temp: 97.2 F (36.2 C)  Resp: 18   General: Patient reports feeling about the same, however she got out of bed and landed on her room  Cardiovascular: Regular rate and rhythm normal S1 is S2  Respiratory:, normal inspiratory effort, CTA Abdomen: Soft nontender nondistended  Musculoskeletal: No edema   Discharge Instructions  Discharge Orders   Future Appointments Provider Department Dept Phone   05/11/2013 4:00 PM [facility] [phone]   Future Orders Complete By Expires   Call MD for:  difficulty breathing, headache or visual disturbances  As directed    Call MD for:  extreme fatigue  As directed    Call MD  for:  persistant dizziness or light-headedness  As directed    Call MD for:  persistant nausea and vomiting  As directed    Call MD for:  severe uncontrolled pain  As directed    Call MD for:  temperature >100.4  As directed     Diet - low sodium heart healthy  As directed    Increase activity slowly  As directed        Medication List    STOP taking these medications       NYQUIL COLD & FLU PO      TAKE these medications       albuterol 108 (90 BASE) MCG/ACT inhaler  Commonly known as:  PROVENTIL HFA;VENTOLIN HFA  Inhale 2 puffs into the lungs every 6 (six) hours as needed. Shortness of breath     amiodarone 400 MG tablet  Commonly known as:  PACERONE  Take 200 mg by mouth 2 (two) times daily.     atorvastatin 40 MG tablet  Commonly known as:  LIPITOR  Take 40 mg by mouth daily.     Fluticasone-Salmeterol 100-50 MCG/DOSE Aepb  Commonly known as:  ADVAIR  Inhale 1 puff into the lungs 2 (two) times daily.     furosemide 20 MG tablet  Commonly known as:  LASIX  Take 20 mg by mouth daily.     GARLIC PO  Take 1 tablet by mouth daily.     levofloxacin 750 MG tablet  Commonly known as:  LEVAQUIN  Take 1 tablet (750 mg total) by mouth daily.     levothyroxine 88 MCG tablet  Commonly known as:  SYNTHROID, LEVOTHROID  Take 88 mcg by mouth daily.     nitroGLYCERIN 0.4 MG SL tablet  Commonly known as:  NITROSTAT  Place 0.4 mg under the tongue every 5 (five) minutes as needed for chest pain.     Rivaroxaban 20 MG Tabs tablet  Commonly known as:  XARELTO  Take 1 tablet (20 mg total) by mouth daily with supper.     VITAMIN D (CHOLECALCIFEROL) PO  Take 2 tablets by mouth daily.       No Known Allergies     Follow-up Information   Follow up with [name], MD In 1 week.   Specialty:  Family Medicine   Contact information:   [address] [place] [address] [phone]        The results of significant diagnostics from this hospitalization (including imaging, microbiology, ancillary and laboratory) are listed below for reference.    Significant Diagnostic Studies: Dg Chest 2 View  04/24/2013   CLINICAL DATA:  Right-sided back pain; shortness of breath.  EXAM:  CHEST  2 VIEW  COMPARISON:  Chest radiograph from 09/29/2011  FINDINGS: The lungs are well-aerated. Mild bibasilar airspace opacities may reflect mild pneumonia. Mild vascular congestion is seen. Biapical scarring is noted. There is no evidence of pleural effusion or pneumothorax.  The heart is enlarged.  No acute osseous abnormalities are seen.  IMPRESSION: 1. Mild bibasilar airspace opacities may reflect mild pneumonia. Interstitial edema could have a similar appearance. 2. Mild vascular congestion and cardiomegaly.   Electronically Signed   By: Garald Balding M.D.   On: 04/24/2013 23:26   Ct Angio Chest Aortic Dissect W &/or W/o  04/25/2013   ADDENDUM REPORT: 04/25/2013 03:59  ADDENDUM: Mild focal airspace opacity is noted at the posterior aspect of the right lower lobe; this raises concern for mild pneumonia, given focal pleuritic  symptoms at this location. The overlying ribs appear intact.   Electronically Signed   By: Garald Balding M.D.   On: 04/25/2013 03:59   04/25/2013   CLINICAL DATA:  Back pain. Left upper back pain with increase shortness of Breath. Productive cough. Hypertension. COPD.  EXAM: CT ANGIOGRAPHY CHEST WITH CONTRAST  TECHNIQUE: Multidetector CT imaging of the chest was performed using the standard protocol during bolus administration of intravenous contrast. Multiplanar CT image reconstructions including MIPs were obtained to evaluate the vascular anatomy. Unenhanced images were also performed.  CONTRAST:  120m OMNIPAQUE IOHEXOL 350 MG/ML SOLN  COMPARISON:  DG CHEST 2 VIEW dated 04/24/2013  FINDINGS: Lungs/Pleura: Moderate centrilobular emphysema.  Minimal subsegmental atelectasis at the right lung base. Minimal left upper lobe ground-glass opacity and septal thickening.  No pleural fluid.  Heart/Mediastinum: No intramural hematoma on unenhanced imaging.  Aortic and branch vessel atherosclerosis. No evidence of aortic dissection or aneurysm. Moderate cardiomegaly with biatrial  enlargement. Pulmonary artery enlargement, with the outflow tract measuring 3.5 cm. No central pulmonary embolism, on this non-dedicated study.  Coronary artery atherosclerosis,  Right paratracheal node of 1.3 cm A right hilar node measures 1.2 cm. Prevascular nodes which measure up to 1.1 cm.  Upper abdomen: Contrast reflux into the IVC and hepatic veins, consistent with elevated right heart pressures. Colonic diverticulosis.  Bones/Musculoskeletal: Advanced thoracic spondylosis  Review of the MIP images confirms the above findings.  IMPRESSION: 1. Aortic atherosclerosis, without dissection or aneurysm. 2. Moderate cardiomegaly with biatrial enlargement and elevated right heart pressures. 3. Pulmonary artery enlargement suggests pulmonary arterial hypertension. No evidence of pulmonary embolism. 4. Thoracic adenopathy, mild. Favored to be reactive. Consider followup chest CT at 3- 6 months to confirm resolution. 5. Minimal left upper lobe ground-glass opacity, superimposed upon centrilobular emphysema. Question mild pulmonary edema.  Electronically Signed: By: KAbigail MiyamotoM.D. On: 04/25/2013 01:24    Microbiology: No results found for this or any previous visit (from the past 240 hour(s)).   Labs: Basic Metabolic Panel:  Recent Labs Lab 04/24/13 2320 04/25/13 0520 04/26/13 0749  NA 140 140 138  K 4.6 4.2 4.6  CL 103 103 102  CO2 _0 GLUCOSE 92 101* 86  BUN _1 CREATININE 1.17* 1.37* 1.29*  CALCIUM 9.0 8.6 8.6   Liver Function Tests:  Recent Labs Lab 04/24/13 2320 04/25/13 0520  AST 23 14  ALT 20 15  ALKPHOS 93 85  BILITOT 0.3 0.2*  PROT 8.5* 7.3  ALBUMIN 3.8 3.2*   No results found for this basename: LIPASE, AMYLASE,  in the last 168 hours No results found for this basename: AMMONIA,  in the last 168 hours CBC:  Recent Labs Lab 04/24/13 2320 04/25/13 0520  WBC 9.3 9.1  NEUTROABS 6.9 6.7  HGB 12.4 11.1*  HCT 37.7 34.9*  MCV 89.5 89.5  PLT 270 229    Cardiac Enzymes:  Recent Labs Lab 04/24/13 2320  TROPONINI <0.30   BNP: BNP (last 3 results)  Recent Labs  04/13/13 0921  PROBNP 127.0*   CBG: No results found for this basename: GLUCAP,  in the last 168 hours     Signed:  ZKelvin Cellar [facility] 04/26/2013, 11:35 AM

## 2013-04-28 DIAGNOSIS — B029 Zoster without complications: Secondary | ICD-10-CM | POA: Diagnosis not present

## 2013-05-02 DIAGNOSIS — H25049 Posterior subcapsular polar age-related cataract, unspecified eye: Secondary | ICD-10-CM | POA: Diagnosis not present

## 2013-05-02 DIAGNOSIS — H2510 Age-related nuclear cataract, unspecified eye: Secondary | ICD-10-CM | POA: Diagnosis not present

## 2013-05-02 DIAGNOSIS — H02839 Dermatochalasis of unspecified eye, unspecified eyelid: Secondary | ICD-10-CM | POA: Diagnosis not present

## 2013-05-02 DIAGNOSIS — H25019 Cortical age-related cataract, unspecified eye: Secondary | ICD-10-CM | POA: Diagnosis not present

## 2013-05-02 DIAGNOSIS — H18419 Arcus senilis, unspecified eye: Secondary | ICD-10-CM | POA: Diagnosis not present

## 2013-05-10 ENCOUNTER — Encounter: Payer: Self-pay | Admitting: Cardiology

## 2013-05-11 ENCOUNTER — Ambulatory Visit (INDEPENDENT_AMBULATORY_CARE_PROVIDER_SITE_OTHER): Payer: Medicare Other | Admitting: Internal Medicine

## 2013-05-11 DIAGNOSIS — I4891 Unspecified atrial fibrillation: Secondary | ICD-10-CM

## 2013-05-11 DIAGNOSIS — B3731 Acute candidiasis of vulva and vagina: Secondary | ICD-10-CM | POA: Diagnosis not present

## 2013-05-11 DIAGNOSIS — B373 Candidiasis of vulva and vagina: Secondary | ICD-10-CM | POA: Diagnosis not present

## 2013-05-11 DIAGNOSIS — J189 Pneumonia, unspecified organism: Secondary | ICD-10-CM | POA: Diagnosis not present

## 2013-05-11 DIAGNOSIS — I480 Paroxysmal atrial fibrillation: Secondary | ICD-10-CM

## 2013-05-11 LAB — PULMONARY FUNCTION TEST
DL/VA % PRED: 60 %
DL/VA: 2.74 ml/min/mmHg/L
DLCO unc % pred: 41 %
DLCO unc: 8.85 ml/min/mmHg
FEF 25-75 POST: 1.31 L/s
FEF 25-75 PRE: 1.13 L/s
FEF2575-%CHANGE-POST: 15 %
FEF2575-%PRED-POST: 80 %
FEF2575-%PRED-PRE: 69 %
FEV1-%Change-Post: 6 %
FEV1-%Pred-Post: 88 %
FEV1-%Pred-Pre: 82 %
FEV1-Post: 1.51 L
FEV1-Pre: 1.41 L
FEV1FVC-%Change-Post: 4 %
FEV1FVC-%Pred-Pre: 98 %
FEV6-%CHANGE-POST: 1 %
FEV6-%PRED-POST: 87 %
FEV6-%Pred-Pre: 86 %
FEV6-Post: 1.86 L
FEV6-Pre: 1.83 L
FEV6FVC-%Change-Post: 0 %
FEV6FVC-%Pred-Post: 103 %
FEV6FVC-%Pred-Pre: 104 %
FVC-%Change-Post: 1 %
FVC-%PRED-POST: 84 %
FVC-%Pred-Pre: 82 %
FVC-POST: 1.86 L
FVC-Pre: 1.83 L
POST FEV1/FVC RATIO: 81 %
PRE FEV1/FVC RATIO: 77 %
Post FEV6/FVC ratio: 100 %
Pre FEV6/FVC Ratio: 100 %
RV % pred: 59 %
RV: 1.22 L
TLC % pred: 68 %
TLC: 3.28 L

## 2013-05-11 NOTE — Progress Notes (Signed)
PFT done today.

## 2013-05-12 ENCOUNTER — Other Ambulatory Visit (INDEPENDENT_AMBULATORY_CARE_PROVIDER_SITE_OTHER): Payer: Medicare Other

## 2013-05-12 ENCOUNTER — Ambulatory Visit (INDEPENDENT_AMBULATORY_CARE_PROVIDER_SITE_OTHER): Payer: Medicare Other | Admitting: Pulmonary Disease

## 2013-05-12 ENCOUNTER — Encounter: Payer: Self-pay | Admitting: Pulmonary Disease

## 2013-05-12 VITALS — BP 118/64 | HR 63 | Temp 97.7°F | Ht 62.75 in | Wt 211.8 lb

## 2013-05-12 DIAGNOSIS — I272 Pulmonary hypertension, unspecified: Secondary | ICD-10-CM

## 2013-05-12 DIAGNOSIS — R0989 Other specified symptoms and signs involving the circulatory and respiratory systems: Secondary | ICD-10-CM

## 2013-05-12 DIAGNOSIS — R0609 Other forms of dyspnea: Secondary | ICD-10-CM | POA: Diagnosis not present

## 2013-05-12 DIAGNOSIS — I2789 Other specified pulmonary heart diseases: Secondary | ICD-10-CM | POA: Diagnosis not present

## 2013-05-12 DIAGNOSIS — J449 Chronic obstructive pulmonary disease, unspecified: Secondary | ICD-10-CM | POA: Diagnosis not present

## 2013-05-12 LAB — SEDIMENTATION RATE: Sed Rate: 61 mm/hr — ABNORMAL HIGH (ref 0–22)

## 2013-05-12 NOTE — Progress Notes (Signed)
   Subjective:    Patient ID: Amy [name], female    DOB: 20-Feb-1946, 68 y.o.   MRN: [id number]  HPI The patient comes in today for an acute sick visit. She was last seen in 2013 where her dyspnea on exertion was felt secondary to minimal obstructive disease, obesity and deconditioning, and also diastolic dysfunction. I have not seen her since, but today she is complaining of progressive dyspnea on exertion over the last one year. In the interim, she's been diagnosed with atrial fibrillation, and has been on amiodarone for over a year. She was in the hospital last month for a mild pneumonia with pleurisy, but feels that she has gotten over this and return to baseline. While in the hospital, she underwent an echocardiogram that showed a normal ejection fraction, but she did have mild MR and moderate left atrial dilatation. Her peak PA pressures were 72 by echo. She has had a left and right cardiac catheterization in 2013 that showed coronary disease, as well as an increased left ventricular end-diastolic pressure consistent with diastolic dysfunction. The patient denies any symptoms of acute bronchospasm. Her weight has increased 9 pounds since the last visit here.   Review of Systems  Constitutional: Negative for fever and unexpected weight change.  HENT: Negative for congestion, dental problem, ear pain, nosebleeds, postnasal drip, rhinorrhea, sinus pressure, sneezing, sore throat and trouble swallowing.   Eyes: Negative for redness and itching.  Respiratory: Positive for shortness of breath and wheezing. Negative for cough and chest tightness.   Cardiovascular: Negative for palpitations and leg swelling.  Gastrointestinal: Negative for nausea and vomiting.  Genitourinary: Negative for dysuria.  Musculoskeletal: Negative for joint swelling.  Skin: Negative for rash.  Neurological: Negative for headaches.  Hematological: Does not bruise/bleed easily.  Psychiatric/Behavioral: Negative for  dysphoric mood. The patient is not nervous/anxious.        Objective:   Physical Exam Obese female in no acute distress Nose without purulence or discharge noted Oropharynx clear Neck without lymphadenopathy or thyromegaly Chest totally clear to auscultation, no wheezes Cardiac exam with regular rate and rhythm, 2/6 systolic murmur Lower extremities with mild edema left greater than right, no cyanosis Alert and oriented, moves all 4 extremities.       Assessment & Plan:

## 2013-05-12 NOTE — Patient Instructions (Addendum)
Discontinue advair.  You have very little airflow obstruction on your most recent breathing studies. Will give you the flu shot today.  Will send a note to Dr. Radford Pax to consider doing a right heart cath to evaluate your pulmonary hypertension. Will check bloodwork today to see if your amiodarone may be contributing to your shortness of breath. Work on weight loss, and stay on cpap compliantly.  Will arrange followup once your results are available.

## 2013-05-12 NOTE — Assessment & Plan Note (Signed)
The patient has minimal airflow obstruction by her recent PFTs, primarily manifested as mild air-trapping. She has not seen any difference since being on Advair, and therefore I will discontinue this. Obviously, this is not the cause of her worsening dyspnea on exertion.

## 2013-05-12 NOTE — Assessment & Plan Note (Addendum)
The patient has progressive dyspnea on exertion over the last one year, however this is not related to her minimal obstructive disease. She is obese and deconditioned, and I do think that her mild restriction noted on recent PFTs is secondary to her centripetal obesity. There is nothing to indicate interstitial lung disease on the most recent CT scan. She does have a very elevated pulmonary artery pressure of her recent echo, and it is unclear how much of this is secondary to pre-or post capillary abnormalities. She does have a history of severe sleep apnea, but is currently being treated with bilevel. She also has a history of diastolic dysfunction. Finally, she has been on amiodarone for at least a year, but it is difficult to assess how much of her DLCO abnormality is secondary to this. She does have some very mild groundglass opacity in the left upper lobe on her recent CT chest, but there is no way to know whether this is related to her antiarrhythmic therapy. We'll send a sedimentation rate today for completeness, but we may have to consider a trial off the medication to see if it impacts her breathing. I also think she needs a repeat right heart catheterization to find out whether she needs more aggressive diuresis and treatment of diastolic dysfunction, or whether she may have pre-capillary pulmonary hypertension that may need a different type of treatment. I will send a note to Dr. Radford Pax to consider this.  Time spent with pt today reviewing her past studies, and most recent hospitalization with xrays and echo, as well as recent pfts was 35mn.

## 2013-05-13 ENCOUNTER — Other Ambulatory Visit: Payer: Self-pay | Admitting: General Surgery

## 2013-05-13 ENCOUNTER — Encounter: Payer: Self-pay | Admitting: General Surgery

## 2013-05-13 DIAGNOSIS — Z79899 Other long term (current) drug therapy: Secondary | ICD-10-CM

## 2013-05-13 DIAGNOSIS — I272 Pulmonary hypertension, unspecified: Secondary | ICD-10-CM

## 2013-05-17 ENCOUNTER — Encounter (HOSPITAL_COMMUNITY): Payer: Self-pay | Admitting: Pharmacy Technician

## 2013-05-18 ENCOUNTER — Other Ambulatory Visit (INDEPENDENT_AMBULATORY_CARE_PROVIDER_SITE_OTHER): Payer: Medicare Other

## 2013-05-18 ENCOUNTER — Other Ambulatory Visit: Payer: Self-pay | Admitting: Cardiology

## 2013-05-18 ENCOUNTER — Telehealth: Payer: Self-pay | Admitting: Cardiology

## 2013-05-18 ENCOUNTER — Ambulatory Visit: Payer: Medicare Other | Admitting: Physician Assistant

## 2013-05-18 DIAGNOSIS — I2789 Other specified pulmonary heart diseases: Secondary | ICD-10-CM

## 2013-05-18 DIAGNOSIS — I272 Pulmonary hypertension, unspecified: Secondary | ICD-10-CM

## 2013-05-18 LAB — CBC WITH DIFFERENTIAL/PLATELET
Basophils Absolute: 0.1 10*3/uL (ref 0.0–0.1)
Basophils Relative: 1.5 % (ref 0.0–3.0)
EOS ABS: 0.1 10*3/uL (ref 0.0–0.7)
Eosinophils Relative: 1.1 % (ref 0.0–5.0)
HCT: 39.4 % (ref 36.0–46.0)
Hemoglobin: 12.4 g/dL (ref 12.0–15.0)
LYMPHS ABS: 1.4 10*3/uL (ref 0.7–4.0)
Lymphocytes Relative: 18.2 % (ref 12.0–46.0)
MCHC: 31.3 g/dL (ref 30.0–36.0)
MCV: 92.7 fl (ref 78.0–100.0)
MONOS PCT: 7.8 % (ref 3.0–12.0)
Monocytes Absolute: 0.6 10*3/uL (ref 0.1–1.0)
Neutro Abs: 5.7 10*3/uL (ref 1.4–7.7)
Neutrophils Relative %: 71.4 % (ref 43.0–77.0)
PLATELETS: 341 10*3/uL (ref 150.0–400.0)
RBC: 4.26 Mil/uL (ref 3.87–5.11)
RDW: 18.1 % — AB (ref 11.5–14.6)
WBC: 7.9 10*3/uL (ref 4.5–10.5)

## 2013-05-18 LAB — BASIC METABOLIC PANEL
BUN: 21 mg/dL (ref 6–23)
CO2: 28 mEq/L (ref 19–32)
CREATININE: 1.3 mg/dL — AB (ref 0.4–1.2)
Calcium: 9.2 mg/dL (ref 8.4–10.5)
Chloride: 108 mEq/L (ref 96–112)
GFR: 51.92 mL/min — ABNORMAL LOW (ref >60.00)
GLUCOSE: 93 mg/dL (ref 70–99)
POTASSIUM: 4.1 meq/L (ref 3.5–5.1)
Sodium: 143 mEq/L (ref 135–145)

## 2013-05-18 LAB — PROTIME-INR
INR: 1.6 ratio — AB (ref 0.8–1.0)
Prothrombin Time: 16.6 s — ABNORMAL HIGH (ref 10.2–12.4)

## 2013-05-18 NOTE — Telephone Encounter (Signed)
Received request from Nurse fax box, documents faxed for surgical clearance. To: [facility]  Fax number: [phone] Attention: [id number]/kdm

## 2013-05-19 ENCOUNTER — Encounter: Payer: Self-pay | Admitting: Physician Assistant

## 2013-05-19 ENCOUNTER — Encounter: Payer: Self-pay | Admitting: General Surgery

## 2013-05-19 ENCOUNTER — Ambulatory Visit (INDEPENDENT_AMBULATORY_CARE_PROVIDER_SITE_OTHER): Payer: Medicare Other | Admitting: Physician Assistant

## 2013-05-19 VITALS — BP 120/70 | HR 51 | Ht 62.75 in | Wt 207.0 lb

## 2013-05-19 DIAGNOSIS — R0609 Other forms of dyspnea: Secondary | ICD-10-CM

## 2013-05-19 DIAGNOSIS — I480 Paroxysmal atrial fibrillation: Secondary | ICD-10-CM

## 2013-05-19 DIAGNOSIS — I5032 Chronic diastolic (congestive) heart failure: Secondary | ICD-10-CM | POA: Diagnosis not present

## 2013-05-19 DIAGNOSIS — I2789 Other specified pulmonary heart diseases: Secondary | ICD-10-CM

## 2013-05-19 DIAGNOSIS — R0989 Other specified symptoms and signs involving the circulatory and respiratory systems: Secondary | ICD-10-CM | POA: Diagnosis not present

## 2013-05-19 DIAGNOSIS — G4733 Obstructive sleep apnea (adult) (pediatric): Secondary | ICD-10-CM

## 2013-05-19 DIAGNOSIS — I272 Pulmonary hypertension, unspecified: Secondary | ICD-10-CM

## 2013-05-19 DIAGNOSIS — I4891 Unspecified atrial fibrillation: Secondary | ICD-10-CM

## 2013-05-19 DIAGNOSIS — I10 Essential (primary) hypertension: Secondary | ICD-10-CM | POA: Diagnosis not present

## 2013-05-19 NOTE — H&P (Signed)
History and Physical   Date:  05/19/2013   ID:  Amy [name], DOB June 11, 1945, MRN [id number]  PCP:  [name], MD  Cardiologist:  Dr. Fransico Him     History of Present Illness: Amy [name] is a 68 y.o. female with a hx of HTN, HL, diastolic CHF, CAD OSA and AFib.  LHC in 10/2011 demonstrated non-obstructive disease and an 80% lesion in a small D1 treated medically.  She was admitted in 02/2012 for a NSTEMI in the setting of AFib with RVR.  This was felt to be demand ischemia.  She has bradycardia in NSR precluding the use of rate controlling medications.  She had evidence of apical hypertrophy on Echo at that time and QT prolongation. The only antiarrhythmic option was Amiodarone and she has been on this since.  She has recently noted worsening dyspnea with exertion.  She describes NYHA Class 3 symptoms. She was seen in f/u with Dr. Gwenette Greet.  It is not clear if her dyspnea is related to amiodarone.  Her ESR is elevated.  She has significant elevated PA pressures on echo and it has been requested that she undergo a RHC.  The patient denies any chest pain, syncope, orthopnea, PND, edema.    Lexiscan Myoview (3/13): No ischemia, EF 81%. LHC (10/07/11): Ostial LAD 20, proximal D1 80% (too small for PCI), LVEDP 21. Echocardiogram (04/26/11): Mild LVH, EF 55-60%, mild MR, moderate LAE, PASP 72.  Recent Labs: 04/13/2013: Pro B Natriuretic peptide (BNP) 127.0*; TSH 2.56  04/25/2013: ALT 15  05/18/2013: Creatinine 1.3*; Hemoglobin 12.4; Potassium 4.1 05/12/13:  ESR 61  Wt Readings from Last 3 Encounters:  05/19/13 207 lb (93.895 kg)  05/12/13 211 lb 12.8 oz (96.072 kg)  04/26/13 213 lb 6.5 oz (96.8 kg)     Past Medical History  Diagnosis Date  . Hypertension   . Hyperlipidemia   . Hypothyroid   . COPD (chronic obstructive pulmonary disease)   . Dizziness     had PT for being off balance - in approx. 2012  . Lumbar disc disease   . Macular degeneration   . Chronic  diastolic heart failure   . Apical variant hypertrophic cardiomyopathy     Diagnosed by echo and ECG 11/13  . GERD (gastroesophageal reflux disease)   . Paroxysmal atrial fibrillation/flutter        . Sinus bradycardia     Current Outpatient Prescriptions  Medication Sig Dispense Refill  . albuterol (PROVENTIL HFA;VENTOLIN HFA) 108 (90 BASE) MCG/ACT inhaler Inhale 2 puffs into the lungs every 6 (six) hours as needed for wheezing or shortness of breath. Shortness of breath      . amiodarone (PACERONE) 400 MG tablet Take 200 mg by mouth 2 (two) times daily.      Marland Kitchen atorvastatin (LIPITOR) 40 MG tablet Take 40 mg by mouth daily.       . cholecalciferol (VITAMIN D) 1000 UNITS tablet Take 1,000 Units by mouth daily.      . furosemide (LASIX) 20 MG tablet Take 20 mg by mouth daily.      Marland Kitchen GARLIC PO Take 1 tablet by mouth daily.      Marland Kitchen levothyroxine (SYNTHROID, LEVOTHROID) 88 MCG tablet Take 88 mcg by mouth daily.       . nitroGLYCERIN (NITROSTAT) 0.4 MG SL tablet Place 0.4 mg under the tongue every 5 (five) minutes as needed for chest pain.      . Rivaroxaban (XARELTO) 20 MG TABS Take 1 tablet (  20 mg total) by mouth daily with supper.  30 tablet  0   No current facility-administered medications for this visit.    Allergies:   Zyban   Social History:  The patient  reports that she quit smoking about 4 years ago. Her smoking use included Cigarettes. She has a 13.2 pack-year smoking history. She has never used smokeless tobacco. She reports that she drinks alcohol. She reports that she does not use illicit drugs.   Family History:  The patient's family history includes Breast cancer in her paternal aunt; Gout in her father; Heart disease in her sister; Lung cancer in her father.   ROS:  Please see the history of present illness.      All other systems reviewed and negative.   PHYSICAL EXAM: VS:  BP 120/70  Pulse 51  Ht 5' 2.75" (1.594 m)  Wt 207 lb (93.895 kg)  BMI 36.95 kg/m2 Well  nourished, well developed, in no acute distress HEENT: normal Neck: no JVD Cardiac:  normal S1, S2; RRR; no murmur Lungs:  clear to auscultation bilaterally, no wheezing, rhonchi or rales Abd: soft, nontender, no hepatomegaly Ext: no edema Skin: warm and dry Neuro:  CNs 2-12 intact, no focal abnormalities noted  EKG:  Sinus brady, HR 51, inf-lat TWI, QTc 446, no significant changes     ASSESSMENT AND PLAN:  1. Dyspnea:  As noted, the patient has had worsening dyspnea.  There is a question of whether she has Amiodarone toxicity.  She has increased PA pressures on echo and is here today to arrange RHC.  Risks and benefits of right heart cath have been discussed with the patient and she agrees to proceed.   2. Atrial Fibrillation:  She is maintaining NSR.  She will hold Xarelto for 2 doses prior to her RHC.  She did have an elevated ESR recently.  If it is ultimately decided to stop Amiodarone, she has limited options for rhythm control.  In review of her records, Dr. Caryl Comes did mention possibly seeing Dr. Lehman Prom at [facility] for convergent ablation.   3. CAD:  No angina.  She is not on aspirin as she is on Xarelto. She cannot tolerate beta blocker due to bradycardia. Continue statin. 4. Hypertension: Controlled. 5. Sleep apnea: She remains on CPAP. 6. Chronic Diastolic CHF: Volume appears stable. Continue current therapy. 7. Disposition: Proceed with right heart catheterization next week as planned.  Signed, Richardson Dopp, PA-C  05/19/2013 3:51 PM

## 2013-05-19 NOTE — Progress Notes (Signed)
[place], [address], [place]  [address] Phone: [phone] Fax:  [phone]  Date:  05/19/2013   ID:  Amy [name], DOB [birth year], MRN [id number]  PCP:  [name], MD  Cardiologist:  Dr. Fransico Him     History of Present Illness: Amy [name] is a 68 y.o. female with a hx of HTN, HL, diastolic CHF, CAD OSA and AFib.  LHC in 10/2011 demonstrated non-obstructive disease and an 80% lesion in a small D1 treated medically.  She was admitted in 02/2012 for a NSTEMI in the setting of AFib with RVR.  This was felt to be demand ischemia.  She has bradycardia in NSR precluding the use of rate controlling medications.  She had evidence of apical hypertrophy on Echo at that time and QT prolongation. The only antiarrhythmic option was Amiodarone and she has been on this since.  She has recently noted worsening dyspnea with exertion.  She describes NYHA Class 3 symptoms. She was seen in f/u with Dr. Gwenette Greet.  It is not clear if her dyspnea is related to amiodarone.  Her ESR is elevated.  She has significant elevated PA pressures on echo and it has been requested that she undergo a RHC.  The patient denies any chest pain, syncope, orthopnea, PND, edema.    Lexiscan Myoview (3/13): No ischemia, EF 81%. LHC (10/07/11): Ostial LAD 20, proximal D1 80% (too small for PCI), LVEDP 21. Echocardiogram (04/26/11): Mild LVH, EF 55-60%, mild MR, moderate LAE, PASP 72.  Recent Labs: 04/13/2013: Pro B Natriuretic peptide (BNP) 127.0*; TSH 2.56  04/25/2013: ALT 15  05/18/2013: Creatinine 1.3*; Hemoglobin 12.4; Potassium 4.1 05/12/13:  ESR 61  Wt Readings from Last 3 Encounters:  05/19/13 207 lb (93.895 kg)  05/12/13 211 lb 12.8 oz (96.072 kg)  04/26/13 213 lb 6.5 oz (96.8 kg)     Past Medical History  Diagnosis Date  . Hypertension   . Hyperlipidemia   . Hypothyroid   . COPD (chronic obstructive pulmonary disease)   . Dizziness     had PT for being off balance - in approx. 2012   . Lumbar disc disease   . Macular degeneration   . Chronic diastolic heart failure   . Apical variant hypertrophic cardiomyopathy     Diagnosed by echo and ECG 11/13  . GERD (gastroesophageal reflux disease)   . Paroxysmal atrial fibrillation/flutter        . Sinus bradycardia     Current Outpatient Prescriptions  Medication Sig Dispense Refill  . albuterol (PROVENTIL HFA;VENTOLIN HFA) 108 (90 BASE) MCG/ACT inhaler Inhale 2 puffs into the lungs every 6 (six) hours as needed for wheezing or shortness of breath. Shortness of breath      . amiodarone (PACERONE) 400 MG tablet Take 200 mg by mouth 2 (two) times daily.      Marland Kitchen atorvastatin (LIPITOR) 40 MG tablet Take 40 mg by mouth daily.       . cholecalciferol (VITAMIN D) 1000 UNITS tablet Take 1,000 Units by mouth daily.      . furosemide (LASIX) 20 MG tablet Take 20 mg by mouth daily.      Marland Kitchen GARLIC PO Take 1 tablet by mouth daily.      Marland Kitchen levothyroxine (SYNTHROID, LEVOTHROID) 88 MCG tablet Take 88 mcg by mouth daily.       . nitroGLYCERIN (NITROSTAT) 0.4 MG SL tablet Place 0.4 mg under the tongue every 5 (five) minutes as needed for chest pain.      Marland Kitchen  Rivaroxaban (XARELTO) 20 MG TABS Take 1 tablet (20 mg total) by mouth daily with supper.  30 tablet  0   No current facility-administered medications for this visit.    Allergies:   Zyban   Social History:  The patient  reports that she quit smoking about 4 years ago. Her smoking use included Cigarettes. She has a 13.2 pack-year smoking history. She has never used smokeless tobacco. She reports that she drinks alcohol. She reports that she does not use illicit drugs.   Family History:  The patient's family history includes Breast cancer in her paternal aunt; Gout in her father; Heart disease in her sister; Lung cancer in her father.   ROS:  Please see the history of present illness.      All other systems reviewed and negative.   PHYSICAL EXAM: VS:  BP 120/70  Pulse 51  Ht 5' 2.75"  (1.594 m)  Wt 207 lb (93.895 kg)  BMI 36.95 kg/m2 Well nourished, well developed, in no acute distress HEENT: normal Neck: no JVD Cardiac:  normal S1, S2; RRR; no murmur Lungs:  clear to auscultation bilaterally, no wheezing, rhonchi or rales Abd: soft, nontender, no hepatomegaly Ext: no edema Skin: warm and dry Neuro:  CNs 2-12 intact, no focal abnormalities noted  EKG:  Sinus brady, HR 51, inf-lat TWI, QTc 446, no significant changes     ASSESSMENT AND PLAN:  1. Dyspnea:  As noted, the patient has had worsening dyspnea.  There is a question of whether she has Amiodarone toxicity.  She has increased PA pressures on echo and is here today to arrange RHC.  Risks and benefits of right heart cath have been discussed with the patient and she agrees to proceed.   2. Atrial Fibrillation:  She is maintaining NSR.  She will hold Xarelto for 2 doses prior to her RHC.  She did have an elevated ESR recently.  If it is ultimately decided to stop Amiodarone, she has limited options for rhythm control.  In review of her records, Dr. Caryl Comes did mention possibly seeing Dr. Lehman Prom at [facility] for convergent ablation.   3. CAD:  No angina.  She is not on aspirin as she is on Xarelto. She cannot tolerate beta blocker due to bradycardia. Continue statin. 4. Hypertension: Controlled. 5. Sleep apnea: She remains on CPAP. 6. Chronic Diastolic CHF: Volume appears stable. Continue current therapy. 7. Disposition: Proceed with right heart catheterization next week as planned.  Signed, Richardson Dopp, PA-C  05/19/2013 3:51 PM

## 2013-05-19 NOTE — Patient Instructions (Signed)
Your physician recommends that you continue on your current medications as directed. Please refer to the Current Medication list given to you today.   Your physician has requested that you have a cardiac catheterization. Cardiac catheterization is used to diagnose and/or treat various heart conditions. Doctors may recommend this procedure for a number of different reasons. The most common reason is to evaluate chest pain. Chest pain can be a symptom of coronary artery disease (CAD), and cardiac catheterization can show whether plaque is narrowing or blocking your heart's arteries. This procedure is also used to evaluate the valves, as well as measure the blood flow and oxygen levels in different parts of your heart. For further information please visit [email or web address]. Please follow instruction sheet, as given. (Scheduled for 05/24/13 at 4:00 PM. Please remember to arrive at [facility] two hours prior to the procedure. )

## 2013-05-23 ENCOUNTER — Telehealth: Payer: Self-pay | Admitting: Cardiology

## 2013-05-23 NOTE — Telephone Encounter (Signed)
New message    Looking for the access code to get into the hospital for procedure on  2/17 .

## 2013-05-23 NOTE — Telephone Encounter (Signed)
Pt advised to report to [place] for cath tomorrow.

## 2013-05-24 ENCOUNTER — Ambulatory Visit (HOSPITAL_COMMUNITY): Admission: RE | Admit: 2013-05-24 | Payer: Medicare Other | Source: Ambulatory Visit | Admitting: Cardiology

## 2013-05-24 ENCOUNTER — Encounter (HOSPITAL_COMMUNITY): Admission: RE | Payer: Self-pay | Source: Ambulatory Visit

## 2013-05-24 SURGERY — RIGHT HEART CATH
Anesthesia: LOCAL

## 2013-05-25 ENCOUNTER — Telehealth: Payer: Self-pay | Admitting: Cardiology

## 2013-05-25 DIAGNOSIS — I272 Pulmonary hypertension, unspecified: Secondary | ICD-10-CM

## 2013-05-25 DIAGNOSIS — Z01818 Encounter for other preprocedural examination: Secondary | ICD-10-CM

## 2013-05-25 DIAGNOSIS — Z79899 Other long term (current) drug therapy: Secondary | ICD-10-CM

## 2013-05-25 NOTE — Telephone Encounter (Signed)
New message    Want to reschedule heart cath

## 2013-05-25 NOTE — Telephone Encounter (Signed)
Labs ordered

## 2013-05-25 NOTE — Telephone Encounter (Signed)
Pt is aware and r/z for 06/01/13.

## 2013-05-30 ENCOUNTER — Other Ambulatory Visit (INDEPENDENT_AMBULATORY_CARE_PROVIDER_SITE_OTHER): Payer: Medicare Other

## 2013-05-30 DIAGNOSIS — I2789 Other specified pulmonary heart diseases: Secondary | ICD-10-CM

## 2013-05-30 DIAGNOSIS — I272 Pulmonary hypertension, unspecified: Secondary | ICD-10-CM

## 2013-05-30 LAB — BASIC METABOLIC PANEL
BUN: 25 mg/dL — ABNORMAL HIGH (ref 6–23)
CALCIUM: 9.1 mg/dL (ref 8.4–10.5)
CO2: 24 mEq/L (ref 19–32)
Chloride: 106 mEq/L (ref 96–112)
Creatinine, Ser: 1.3 mg/dL — ABNORMAL HIGH (ref 0.4–1.2)
GFR: 51.46 mL/min — AB (ref >60.00)
Glucose, Bld: 81 mg/dL (ref 70–99)
Potassium: 4.2 mEq/L (ref 3.5–5.1)
SODIUM: 138 meq/L (ref 135–145)

## 2013-05-30 LAB — CBC WITH DIFFERENTIAL/PLATELET
BASOS ABS: 0 10*3/uL (ref 0.0–0.1)
Basophils Relative: 0.4 % (ref 0.0–3.0)
EOS ABS: 0.1 10*3/uL (ref 0.0–0.7)
Eosinophils Relative: 1.6 % (ref 0.0–5.0)
HCT: 39.3 % (ref 36.0–46.0)
Hemoglobin: 12.8 g/dL (ref 12.0–15.0)
LYMPHS ABS: 1.3 10*3/uL (ref 0.7–4.0)
Lymphocytes Relative: 17.9 % (ref 12.0–46.0)
MCHC: 32.6 g/dL (ref 30.0–36.0)
MCV: 91.4 fl (ref 78.0–100.0)
MONO ABS: 0.7 10*3/uL (ref 0.1–1.0)
Monocytes Relative: 9.5 % (ref 3.0–12.0)
Neutro Abs: 5 10*3/uL (ref 1.4–7.7)
Neutrophils Relative %: 70.6 % (ref 43.0–77.0)
PLATELETS: 306 10*3/uL (ref 150.0–400.0)
RBC: 4.3 Mil/uL (ref 3.87–5.11)
RDW: 18.3 % — ABNORMAL HIGH (ref 11.5–14.6)
WBC: 7.2 10*3/uL (ref 4.5–10.5)

## 2013-05-30 LAB — PROTIME-INR
INR: 2.2 ratio — AB (ref 0.8–1.0)
Prothrombin Time: 22.6 s — ABNORMAL HIGH (ref 10.2–12.4)

## 2013-06-01 ENCOUNTER — Ambulatory Visit (HOSPITAL_COMMUNITY)
Admission: RE | Admit: 2013-06-01 | Discharge: 2013-06-01 | Disposition: A | Discharge status: Home / Self Care | Payer: Medicare Other | Source: Ambulatory Visit | Attending: Cardiology | Admitting: Cardiology

## 2013-06-01 ENCOUNTER — Encounter (HOSPITAL_COMMUNITY): Payer: Self-pay | Admitting: *Deleted

## 2013-06-01 ENCOUNTER — Encounter (HOSPITAL_COMMUNITY)
Admission: RE | Disposition: A | Discharge status: Home / Self Care | Payer: Self-pay | Source: Ambulatory Visit | Attending: Cardiology

## 2013-06-01 DIAGNOSIS — Z803 Family history of malignant neoplasm of breast: Secondary | ICD-10-CM | POA: Insufficient documentation

## 2013-06-01 DIAGNOSIS — I272 Pulmonary hypertension, unspecified: Secondary | ICD-10-CM

## 2013-06-01 DIAGNOSIS — Z888 Allergy status to other drugs, medicaments and biological substances status: Secondary | ICD-10-CM | POA: Insufficient documentation

## 2013-06-01 DIAGNOSIS — I2789 Other specified pulmonary heart diseases: Secondary | ICD-10-CM | POA: Insufficient documentation

## 2013-06-01 DIAGNOSIS — I10 Essential (primary) hypertension: Secondary | ICD-10-CM | POA: Diagnosis not present

## 2013-06-01 DIAGNOSIS — J449 Chronic obstructive pulmonary disease, unspecified: Secondary | ICD-10-CM | POA: Diagnosis not present

## 2013-06-01 DIAGNOSIS — I422 Other hypertrophic cardiomyopathy: Secondary | ICD-10-CM | POA: Diagnosis not present

## 2013-06-01 DIAGNOSIS — Z87891 Personal history of nicotine dependence: Secondary | ICD-10-CM | POA: Insufficient documentation

## 2013-06-01 DIAGNOSIS — E785 Hyperlipidemia, unspecified: Secondary | ICD-10-CM | POA: Diagnosis not present

## 2013-06-01 DIAGNOSIS — I279 Pulmonary heart disease, unspecified: Secondary | ICD-10-CM | POA: Diagnosis not present

## 2013-06-01 DIAGNOSIS — I5032 Chronic diastolic (congestive) heart failure: Secondary | ICD-10-CM | POA: Insufficient documentation

## 2013-06-01 DIAGNOSIS — G4733 Obstructive sleep apnea (adult) (pediatric): Secondary | ICD-10-CM | POA: Insufficient documentation

## 2013-06-01 DIAGNOSIS — J4489 Other specified chronic obstructive pulmonary disease: Secondary | ICD-10-CM | POA: Insufficient documentation

## 2013-06-01 DIAGNOSIS — I4891 Unspecified atrial fibrillation: Secondary | ICD-10-CM | POA: Insufficient documentation

## 2013-06-01 DIAGNOSIS — K219 Gastro-esophageal reflux disease without esophagitis: Secondary | ICD-10-CM | POA: Insufficient documentation

## 2013-06-01 DIAGNOSIS — Z79899 Other long term (current) drug therapy: Secondary | ICD-10-CM | POA: Insufficient documentation

## 2013-06-01 HISTORY — PX: RIGHT HEART CATHETERIZATION: SHX5447

## 2013-06-01 LAB — POCT I-STAT 3, VENOUS BLOOD GAS (G3P V)
Acid-base deficit: 4 mmol/L — ABNORMAL HIGH (ref 0.0–2.0)
BICARBONATE: 21.8 meq/L (ref 20.0–24.0)
O2 SAT: 53 %
PCO2 VEN: 43.9 mmHg — AB (ref 45.0–50.0)
TCO2: 23 mmol/L (ref 0–100)
pH, Ven: 7.304 — ABNORMAL HIGH (ref 7.250–7.300)
pO2, Ven: 31 mmHg (ref 30.0–45.0)

## 2013-06-01 LAB — POCT I-STAT 3, ART BLOOD GAS (G3+)
ACID-BASE DEFICIT: 2 mmol/L (ref 0.0–2.0)
BICARBONATE: 22.5 meq/L (ref 20.0–24.0)
O2 Saturation: 87 %
PO2 ART: 52 mmHg — AB (ref 80.0–100.0)
TCO2: 24 mmol/L (ref 0–100)
pCO2 arterial: 36.8 mmHg (ref 35.0–45.0)
pH, Arterial: 7.394 (ref 7.350–7.450)

## 2013-06-01 LAB — PROTIME-INR
INR: 1.04 (ref 0.00–1.49)
Prothrombin Time: 13.4 seconds (ref 11.6–15.2)

## 2013-06-01 SURGERY — RIGHT HEART CATH
Anesthesia: LOCAL

## 2013-06-01 MED ORDER — SODIUM CHLORIDE 0.9 % IV SOLN
250.0000 mL | INTRAVENOUS | Status: DC | PRN
Start: 2013-06-01 — End: 2013-06-01

## 2013-06-01 MED ORDER — ASPIRIN 81 MG PO CHEW
81.0000 mg | CHEWABLE_TABLET | ORAL | Status: AC
  Administered 2013-06-01: 81 mg via ORAL
  Filled 2013-06-01: qty 1

## 2013-06-01 MED ORDER — ACETAMINOPHEN 325 MG PO TABS: 650.0000 mg | ORAL_TABLET | ORAL | Status: DC | PRN

## 2013-06-01 MED ORDER — MIDAZOLAM HCL 2 MG/2ML IJ SOLN
INTRAMUSCULAR | Status: AC
  Filled 2013-06-01: qty 2

## 2013-06-01 MED ORDER — SODIUM CHLORIDE 0.9 % IJ SOLN: 3.0000 mL | Freq: Two times a day (BID) | INTRAMUSCULAR | Status: DC

## 2013-06-01 MED ORDER — HEPARIN (PORCINE) IN NACL 2-0.9 UNIT/ML-% IJ SOLN
INTRAMUSCULAR | Status: AC
  Filled 2013-06-01: qty 1000

## 2013-06-01 MED ORDER — LIDOCAINE HCL (PF) 1 % IJ SOLN
INTRAMUSCULAR | Status: AC
  Filled 2013-06-01: qty 30

## 2013-06-01 MED ORDER — SODIUM CHLORIDE 0.9 % IV SOLN: 250.0000 mL | INTRAVENOUS | Status: DC | PRN

## 2013-06-01 MED ORDER — FENTANYL CITRATE 0.05 MG/ML IJ SOLN
INTRAMUSCULAR | Status: AC
  Filled 2013-06-01: qty 2

## 2013-06-01 MED ORDER — SODIUM CHLORIDE 0.9 % IJ SOLN: 3.0000 mL | INTRAMUSCULAR | Status: DC | PRN

## 2013-06-01 MED ORDER — ONDANSETRON HCL 4 MG/2ML IJ SOLN: 4.0000 mg | Freq: Four times a day (QID) | INTRAMUSCULAR | Status: DC | PRN

## 2013-06-01 MED ORDER — NITROGLYCERIN 0.2 MG/ML ON CALL CATH LAB
INTRAVENOUS | Status: AC
  Filled 2013-06-01: qty 1

## 2013-06-01 MED ORDER — SODIUM CHLORIDE 0.9 % IV SOLN
INTRAVENOUS | Status: DC
  Administered 2013-06-01: 20 mL/h via INTRAVENOUS

## 2013-06-01 NOTE — Interval H&P Note (Signed)
History and Physical Interval Note:  06/01/2013 1:02 PM  Amy [name]   has presented today for surgery, with the diagnosis of pulmonary hypertension  The various methods of treatment have been discussed with the patient and family. After consideration of risks, benefits and other options for treatment, the patient has consented to  Procedure(s): RIGHT HEART CATH (N/[name]) as [name] surgical intervention .  The patient's history has been reviewed, patient examined, no change in status, stable for surgery.  I have reviewed the patient's chart and labs.  Questions were answered to the patient's satisfaction.     Greta Yung [employer]

## 2013-06-01 NOTE — CV Procedure (Signed)
    Cardiac Catheterization Procedure Note  Name: Amy [name] MRN: [id number] DOB: 03-Jun-1945  Procedure: Right Heart Cath  Indication: Assess for pulmonary hypertension.    Procedural Details: The right groin was prepped, draped, and anesthetized with 1% lidocaine. Using the modified Seldinger technique a 5 French sheath was placed in the right femoral artery and a 7 French sheath was placed in the right femoral vein. A Swan-Ganz catheter was used for the right heart catheterization. Standard protocol was followed for recording of right heart pressures and sampling of oxygen saturations. Fick cardiac output was calculated.  The patient was transferred to the post catheterization recovery area for further monitoring.  Procedural Findings: Hemodynamics (mmHg) RA mean 4 RV 42/14 PA 45/20, mean 29 PCWP mean 14  Oxygen saturations: PA 53% AO 87%  Cardiac Output (Fick) 4.36  Cardiac Index (Fick) 2.25 PVR 3.4 WU  Cardiac Output (Thermodilution) 4.88 Cardiac Index (Thermodilution) 2.52  PVR 3.1 WU  Final Conclusions:  There is mild pulmonary arterial hypertension.  Normal PCWP and RA pressure.  This degree of pulmonary hypertension could be related to OSA/low oxygen saturation.  Would make sure that OSA is adequately treated initially.  Probably would not use pulmonary vasodilators with this degree of PAH.   [name] 06/01/2013, 2:35 PM

## 2013-06-01 NOTE — H&P (View-Only) (Signed)
 History and Physical   Date:  05/19/2013   ID:  Amy [name], DOB 02/28/1946, MRN [id number]  PCP:  RANKINS,VICTORIA, MD  Cardiologist:  Dr. Traci Turner     History of Present Illness: Amy [name] is a 68 y.o. female with a hx of HTN, HL, diastolic CHF, CAD OSA and AFib.  LHC in 10/2011 demonstrated non-obstructive disease and an 80% lesion in a small D1 treated medically.  She was admitted in 02/2012 for a NSTEMI in the setting of AFib with RVR.  This was felt to be demand ischemia.  She has bradycardia in NSR precluding the use of rate controlling medications.  She had evidence of apical hypertrophy on Echo at that time and QT prolongation. The only antiarrhythmic option was Amiodarone and she has been on this since.  She has recently noted worsening dyspnea with exertion.  She describes NYHA Class 3 symptoms. She was seen in f/u with Dr. Clance.  It is not clear if her dyspnea is related to amiodarone.  Her ESR is elevated.  She has significant elevated PA pressures on echo and it has been requested that she undergo a RHC.  The patient denies any chest pain, syncope, orthopnea, PND, edema.    Lexiscan Myoview (3/13): No ischemia, EF 81%. LHC (10/07/11): Ostial LAD 20, proximal D1 80% (too small for PCI), LVEDP 21. Echocardiogram (04/26/11): Mild LVH, EF 55-60%, mild MR, moderate LAE, PASP 72.  Recent Labs: 04/13/2013: Pro B Natriuretic peptide (BNP) 127.0*; TSH 2.56  04/25/2013: ALT 15  05/18/2013: Creatinine 1.3*; Hemoglobin 12.4; Potassium 4.1 05/12/13:  ESR 61  Wt Readings from Last 3 Encounters:  05/19/13 207 lb (93.895 kg)  05/12/13 211 lb 12.8 oz (96.072 kg)  04/26/13 213 lb 6.5 oz (96.8 kg)     Past Medical History  Diagnosis Date  . Hypertension   . Hyperlipidemia   . Hypothyroid   . COPD (chronic obstructive pulmonary disease)   . Dizziness     had PT for being off balance - in approx. 2012  . Lumbar disc disease   . Macular degeneration   . Chronic  diastolic heart failure   . Apical variant hypertrophic cardiomyopathy     Diagnosed by echo and ECG 11/13  . GERD (gastroesophageal reflux disease)   . Paroxysmal atrial fibrillation/flutter        . Sinus bradycardia     Current Outpatient Prescriptions  Medication Sig Dispense Refill  . albuterol (PROVENTIL HFA;VENTOLIN HFA) 108 (90 BASE) MCG/ACT inhaler Inhale 2 puffs into the lungs every 6 (six) hours as needed for wheezing or shortness of breath. Shortness of breath      . amiodarone (PACERONE) 400 MG tablet Take 200 mg by mouth 2 (two) times daily.      . atorvastatin (LIPITOR) 40 MG tablet Take 40 mg by mouth daily.       . cholecalciferol (VITAMIN D) 1000 UNITS tablet Take 1,000 Units by mouth daily.      . furosemide (LASIX) 20 MG tablet Take 20 mg by mouth daily.      . GARLIC PO Take 1 tablet by mouth daily.      . levothyroxine (SYNTHROID, LEVOTHROID) 88 MCG tablet Take 88 mcg by mouth daily.       . nitroGLYCERIN (NITROSTAT) 0.4 MG SL tablet Place 0.4 mg under the tongue every 5 (five) minutes as needed for chest pain.      . Rivaroxaban (XARELTO) 20 MG TABS Take 1 tablet (  20 mg total) by mouth daily with supper.  30 tablet  0   No current facility-administered medications for this visit.    Allergies:   Zyban   Social History:  The patient  reports that she quit smoking about 4 years ago. Her smoking use included Cigarettes. She has a 13.2 pack-year smoking history. She has never used smokeless tobacco. She reports that she drinks alcohol. She reports that she does not use illicit drugs.   Family History:  The patient's family history includes Breast cancer in her paternal aunt; Gout in her father; Heart disease in her sister; Lung cancer in her father.   ROS:  Please see the history of present illness.      All other systems reviewed and negative.   PHYSICAL EXAM: VS:  BP 120/70  Pulse 51  Ht 5' 2.75" (1.594 m)  Wt 207 lb (93.895 kg)  BMI 36.95 kg/m2 Well  nourished, well developed, in no acute distress HEENT: normal Neck: no JVD Cardiac:  normal S1, S2; RRR; no murmur Lungs:  clear to auscultation bilaterally, no wheezing, rhonchi or rales Abd: soft, nontender, no hepatomegaly Ext: no edema Skin: warm and dry Neuro:  CNs 2-12 intact, no focal abnormalities noted  EKG:  Sinus brady, HR 51, inf-lat TWI, QTc 446, no significant changes     ASSESSMENT AND PLAN:  1. Dyspnea:  As noted, the patient has had worsening dyspnea.  There is a question of whether she has Amiodarone toxicity.  She has increased PA pressures on echo and is here today to arrange RHC.  Risks and benefits of right heart cath have been discussed with the patient and she agrees to proceed.   2. Atrial Fibrillation:  She is maintaining NSR.  She will hold Xarelto for 2 doses prior to her RHC.  She did have an elevated ESR recently.  If it is ultimately decided to stop Amiodarone, she has limited options for rhythm control.  In review of her records, Dr. Caryl Comes did mention possibly seeing Dr. Lehman Prom at [facility] for convergent ablation.   3. CAD:  No angina.  She is not on aspirin as she is on Xarelto. She cannot tolerate beta blocker due to bradycardia. Continue statin. 4. Hypertension: Controlled. 5. Sleep apnea: She remains on CPAP. 6. Chronic Diastolic CHF: Volume appears stable. Continue current therapy. 7. Disposition: Proceed with right heart catheterization next week as planned.  Signed, Richardson Dopp, PA-C  05/19/2013 3:51 PM

## 2013-06-01 NOTE — Discharge Instructions (Signed)
Angiography, Care After Refer to this sheet in the next few weeks. These instructions provide you with information on caring for yourself after your procedure. Your health care provider may also give you more specific instructions. Your treatment has been planned according to current medical practices, but problems sometimes occur. Call your health care provider if you have any problems or questions after your procedure.  WHAT TO EXPECT AFTER THE PROCEDURE After your procedure, it is typical to have the following sensations:  Minor discomfort or tenderness and a small bump at the catheter insertion site. The bump should usually decrease in size and tenderness within 1 to 2 weeks.  Any bruising will usually fade within 2 to 4 weeks. HOME CARE INSTRUCTIONS   You may need to keep taking blood thinners if they were prescribed for you. Only take over-the-counter or prescription medicines for pain, fever, or discomfort as directed by your health care provider.  Do not apply powder or lotion to the site.  Do not sit in a bathtub, swimming pool, or whirlpool for 5 to 7 days.  You may shower 24 hours after the procedure. Remove the bandage (dressing) and gently wash the site with plain soap and water. Gently pat the site dry.  Inspect the site at least twice daily.  Limit your activity for the first 24 hours. Do not bend, squat, or lift anything over 10 lb or as directed by your health care provider.  Do not drive home if you are discharged the day of the procedure. Have someone else drive you. Follow instructions about when you can drive or return to work. SEEK MEDICAL CARE IF:  You get lightheaded when standing up.  You have drainage (other than a small amount of blood on the dressing).  You have chills.  You have a fever.  You have redness, warmth, swelling, or pain at the insertion site. SEEK IMMEDIATE MEDICAL CARE IF:   You develop chest pain or shortness of breath, feel faint, or  pass out.  You have bleeding, swelling larger than a walnut, or drainage from the catheter insertion site.  You develop pain, discoloration, coldness, or severe bruising in the leg or arm that held the catheter.  You have heavy bleeding from the site. If this happens, hold pressure on the site. MAKE SURE YOU:  Understand these instructions.  Will watch your condition.  Will get help right away if you are not doing well or get worse. Document Released: 10/10/2004 Document Revised: 11/24/2012 Document Reviewed: 08/16/2012 [facility] Patient Information 2014 [place].

## 2013-06-03 ENCOUNTER — Telehealth: Payer: Self-pay | Admitting: General Surgery

## 2013-06-03 ENCOUNTER — Telehealth: Payer: Self-pay | Admitting: Pulmonary Disease

## 2013-06-03 NOTE — Telephone Encounter (Signed)
LMOM x 1 

## 2013-06-03 NOTE — Telephone Encounter (Signed)
LMTCB x2  

## 2013-06-03 NOTE — Progress Notes (Signed)
Let pt know that I have reviewed her right heart cath. She has minimal pulmonary htn, that is good news. The echo was not very accurate at estimating. This still leaves her with her sob, and it is unclear whether amiodarone may be playing a role. I will send a message to Dr. Turner to consider stopping for a period of time to see if her breathing gets better.   

## 2013-06-03 NOTE — Telephone Encounter (Signed)
Per Dr. Gwenette Greet - recommend stopping Amiodarone given SOB and elevated ESR and have her followup with me in 4 weeks to see how she is doing    LVM for pt to return call.

## 2013-06-03 NOTE — Telephone Encounter (Signed)
Results have been explained to patient, pt expressed understanding. Nothing further needed.Marland Kitchen

## 2013-06-03 NOTE — Telephone Encounter (Signed)
Let pt know that I have reviewed her right heart cath. She has minimal pulmonary htn, that is good news. The echo was not very accurate at estimating. This still leaves her with her sob, and it is unclear whether amiodarone may be playing a role. I will send a message to Dr. Radford Pax to consider stopping for a period of time to see if her breathing gets better.

## 2013-06-03 NOTE — Telephone Encounter (Signed)
Pt is returning Ashtyn's call & can be reached at [phone].  Satira Anis

## 2013-06-07 ENCOUNTER — Other Ambulatory Visit: Payer: Self-pay | Admitting: General Surgery

## 2013-06-07 NOTE — Telephone Encounter (Signed)
Pt is aware. Updated med list and set pt up for a 4 week f/u

## 2013-06-15 ENCOUNTER — Ambulatory Visit: Payer: BC Managed Care – PPO | Admitting: Cardiology

## 2013-06-20 DIAGNOSIS — IMO0002 Reserved for concepts with insufficient information to code with codable children: Secondary | ICD-10-CM | POA: Diagnosis not present

## 2013-06-20 DIAGNOSIS — H2510 Age-related nuclear cataract, unspecified eye: Secondary | ICD-10-CM | POA: Diagnosis not present

## 2013-06-20 DIAGNOSIS — H269 Unspecified cataract: Secondary | ICD-10-CM | POA: Diagnosis not present

## 2013-06-21 DIAGNOSIS — H2510 Age-related nuclear cataract, unspecified eye: Secondary | ICD-10-CM | POA: Diagnosis not present

## 2013-07-04 DIAGNOSIS — IMO0002 Reserved for concepts with insufficient information to code with codable children: Secondary | ICD-10-CM | POA: Diagnosis not present

## 2013-07-04 DIAGNOSIS — H2510 Age-related nuclear cataract, unspecified eye: Secondary | ICD-10-CM | POA: Diagnosis not present

## 2013-07-04 DIAGNOSIS — H269 Unspecified cataract: Secondary | ICD-10-CM | POA: Diagnosis not present

## 2013-07-11 ENCOUNTER — Ambulatory Visit (INDEPENDENT_AMBULATORY_CARE_PROVIDER_SITE_OTHER): Payer: Medicare Other | Admitting: Cardiology

## 2013-07-11 ENCOUNTER — Encounter: Payer: Self-pay | Admitting: Cardiology

## 2013-07-11 VITALS — BP 141/81 | HR 50 | Ht 60.35 in | Wt 208.4 lb

## 2013-07-11 DIAGNOSIS — I272 Pulmonary hypertension, unspecified: Secondary | ICD-10-CM

## 2013-07-11 DIAGNOSIS — G4733 Obstructive sleep apnea (adult) (pediatric): Secondary | ICD-10-CM

## 2013-07-11 DIAGNOSIS — I2789 Other specified pulmonary heart diseases: Secondary | ICD-10-CM | POA: Diagnosis not present

## 2013-07-11 DIAGNOSIS — I5032 Chronic diastolic (congestive) heart failure: Secondary | ICD-10-CM | POA: Diagnosis not present

## 2013-07-11 DIAGNOSIS — I10 Essential (primary) hypertension: Secondary | ICD-10-CM | POA: Diagnosis not present

## 2013-07-11 DIAGNOSIS — I480 Paroxysmal atrial fibrillation: Secondary | ICD-10-CM

## 2013-07-11 DIAGNOSIS — I4891 Unspecified atrial fibrillation: Secondary | ICD-10-CM

## 2013-07-11 NOTE — Progress Notes (Signed)
[place], [place], [place]  [address] Phone: [phone] Fax:  [phone]  Date:  07/11/2013   ID:  Amy [name], DOB 29-Oct-1945, MRN [id number]  PCP:  [name], MD  Cardiologist:  Fransico Him, MD     History of Present Illness: Amy [name] is a 68 y.o. female with a hx of HTN, HL, diastolic CHF, CAD OSA and AFib. LHC in 10/2011 demonstrated non-obstructive disease and an 80% lesion in a small D1 treated medically. She was admitted in 02/2012 for a NSTEMI in the setting of AFib with RVR. This was felt to be demand ischemia. She has bradycardia in NSR precluding the use of rate controlling medications. She had evidence of apical hypertrophy on Echo at that time and QT prolongation. The only antiarrhythmic option was Amiodarone and she has been on this since. She has recently noted worsening dyspnea with exertion. She describes NYHA Class 3 symptoms. She was seen in f/u with Dr. Gwenette Greet. It is not clear if her dyspnea is related to amiodarone. Her ESR was elevated. She had significant elevated PA pressures on echo and subsequently underwent RHC which showed mild pulmonary HTN with PAP 45/47mHg. She presents back today for followup.  She was evaluated by Dr. CGwenette Greetand it was felt that her SOB may be due to a[facility] with elevated ESR and her Amio was stopped 4 weeks ago.  She now presents today for followup.  The patient denies any chest pain, syncope, orthopnea, PND. She says that since coming off of the amio her SOB is slightly better.  She still has some chronic LE edema in her ankles.  She is not using her BIPAP the amount of hours she needs because the machine makes noises and keeps her husband awake.  She tried the full face mask but feels like she is smothering with it.  She now is using a nasal mask and has a chin strap but does not wear it.     Wt Readings from Last 3 Encounters:  07/11/13 208 lb 6.4 oz (94.53 kg)  06/01/13 207 lb (93.895 kg)    06/01/13 207 lb (93.895 kg)     Past Medical History  Diagnosis Date  . Hypertension   . Hyperlipidemia   . Hypothyroid   . COPD (chronic obstructive pulmonary disease)   . Dizziness     had PT for being off balance - in approx. 2012  . Lumbar disc disease   . Macular degeneration   . Chronic diastolic heart failure   . Apical variant hypertrophic cardiomyopathy     Diagnosed by echo and ECG 11/13  . GERD (gastroesophageal reflux disease)   . Paroxysmal atrial fibrillation/flutter        . Sinus bradycardia     Current Outpatient Prescriptions  Medication Sig Dispense Refill  . albuterol (PROVENTIL HFA;VENTOLIN HFA) 108 (90 BASE) MCG/ACT inhaler Inhale 2 puffs into the lungs every 6 (six) hours as needed for wheezing or shortness of breath. Shortness of breath      . atorvastatin (LIPITOR) 40 MG tablet Take 40 mg by mouth daily.       . furosemide (LASIX) 20 MG tablet Take 20 mg by mouth daily.      .Marland KitchenGARLIC PO Take 1 tablet by mouth daily.      .Marland Kitchenlevothyroxine (SYNTHROID, LEVOTHROID) 88 MCG tablet Take 88 mcg by mouth daily.       . nitroGLYCERIN (NITROSTAT) 0.4 MG SL tablet Place  0.4 mg under the tongue every 5 (five) minutes as needed for chest pain.      . Rivaroxaban (XARELTO) 20 MG TABS Take 1 tablet (20 mg total) by mouth daily with supper.  30 tablet  0  . cholecalciferol (VITAMIN D) 1000 UNITS tablet Take 1,000 Units by mouth daily.       No current facility-administered medications for this visit.    Allergies:    Allergies  Allergen Reactions  . Zyban [Bupropion] Itching    Social History:  The patient  reports that she quit smoking about 4 years ago. Her smoking use included Cigarettes. She has a 13.2 pack-year smoking history. She has never used smokeless tobacco. She reports that she drinks alcohol. She reports that she does not use illicit drugs.   Family History:  The patient's family history includes Breast cancer in her paternal aunt; Gout in her  father; Heart disease in her sister; Lung cancer in her father.   ROS:  Please see the history of present illness.      All other systems reviewed and negative.   PHYSICAL EXAM: VS:  BP 141/81  Pulse 50  Ht 5' 0.35" (1.533 m)  Wt 208 lb 6.4 oz (94.53 kg)  BMI 40.22 kg/m2 Well nourished, well developed, in no acute distress HEENT: normal Neck: no JVD Cardiac:  normal S1, S2; RRR; no murmur Lungs:  clear to auscultation bilaterally, no wheezing, rhonchi or rales Abd: soft, nontender, no hepatomegaly Ext: no edema Skin: warm and dry Neuro:  CNs 2-12 intact, no focal abnormalities noted       ASSESSMENT AND PLAN:  1. Dyspnea: As noted, the patient has had worsening dyspnea. There is a question of whether she has Amiodarone toxicity. RHC showed only mildly increased PA pressures felt secondary to OSA.   2. Atrial Fibrillation: She is maintaining NSR.  She did have an elevated ESR recently and Amiodarone has been stopped.  If she has reoccurence of her afib Dr. Caryl Comes did mention possibly seeing Dr. Lehman Prom at [facility] for ablation.  3. CAD: No angina. She is not on aspirin as she is on Xarelto. She cannot tolerate beta blocker due to bradycardia. Continue statin. 4. Hypertension: Controlled. 5. Sleep apnea: She remains on BiPAP but her last compliance download showed reduced compliance and elevated AHI with large mask leak.  She is not using a nasal mask but is not using her chin strap which could cause a high AHI.  I have asked her to start using the chin strap and I will get a download in 4 weeks.  I have encouraged her to try to use her device all night every night. 6. Chronic Diastolic CHF: Volume appears stable. Continue current therapy.   Followup with me in 3 months   Signed, Fransico Him, MD 07/11/2013 10:53 AM

## 2013-07-11 NOTE — Patient Instructions (Signed)
Your physician recommends that you continue on your current medications as directed. Please refer to the Current Medication list given to you today.  Start wearing your chin strap for your cpap. A download with [facility] will be needed 4 wks after you start wearing your chinstrap.  You have a follow up appt scheduled for 10/11/13 @ 9 am

## 2013-08-13 ENCOUNTER — Encounter: Payer: Self-pay | Admitting: Cardiology

## 2013-08-22 ENCOUNTER — Encounter: Payer: Self-pay | Admitting: Cardiology

## 2013-08-23 ENCOUNTER — Other Ambulatory Visit: Payer: Self-pay | Admitting: General Surgery

## 2013-08-23 DIAGNOSIS — G4733 Obstructive sleep apnea (adult) (pediatric): Secondary | ICD-10-CM

## 2013-09-07 ENCOUNTER — Encounter: Payer: Self-pay | Admitting: Cardiology

## 2013-10-11 ENCOUNTER — Ambulatory Visit: Payer: Medicare Other | Admitting: Cardiology

## 2013-10-12 ENCOUNTER — Ambulatory Visit (INDEPENDENT_AMBULATORY_CARE_PROVIDER_SITE_OTHER): Payer: Medicare Other | Admitting: Cardiology

## 2013-10-12 ENCOUNTER — Encounter: Payer: Self-pay | Admitting: Cardiology

## 2013-10-12 VITALS — BP 132/82 | HR 60 | Ht 63.0 in | Wt 208.8 lb

## 2013-10-12 DIAGNOSIS — I428 Other cardiomyopathies: Secondary | ICD-10-CM

## 2013-10-12 DIAGNOSIS — G4733 Obstructive sleep apnea (adult) (pediatric): Secondary | ICD-10-CM

## 2013-10-12 DIAGNOSIS — I10 Essential (primary) hypertension: Secondary | ICD-10-CM

## 2013-10-12 DIAGNOSIS — I4891 Unspecified atrial fibrillation: Secondary | ICD-10-CM

## 2013-10-12 DIAGNOSIS — I2789 Other specified pulmonary heart diseases: Secondary | ICD-10-CM

## 2013-10-12 DIAGNOSIS — I480 Paroxysmal atrial fibrillation: Secondary | ICD-10-CM

## 2013-10-12 DIAGNOSIS — I272 Pulmonary hypertension, unspecified: Secondary | ICD-10-CM

## 2013-10-12 DIAGNOSIS — R0602 Shortness of breath: Secondary | ICD-10-CM

## 2013-10-12 DIAGNOSIS — I5032 Chronic diastolic (congestive) heart failure: Secondary | ICD-10-CM

## 2013-10-12 DIAGNOSIS — I422 Other hypertrophic cardiomyopathy: Secondary | ICD-10-CM

## 2013-10-12 NOTE — Patient Instructions (Signed)
Your physician recommends that you continue on your current medications as directed. Please refer to the Current Medication list given to you today.  Your physician has requested that you have a lexiscan myoview. For further information please visit [email or web address]. Please follow instruction sheet, as given.  Your physician wants you to follow-up in: 6 months You will receive a reminder letter in the mail two months in advance. If you don't receive a letter, please call our office to schedule the follow-up appointment.

## 2013-10-12 NOTE — Progress Notes (Signed)
[place], [place], [place]  [address] Phone: [phone] Fax:  [phone]  Date:  10/12/2013   ID:  Amy [name], DOB Aug 20, 1945, MRN [id number]  PCP:  [name], MD  Cardiologist:  Fransico Him, MD     History of Present Illness: Amy [name] is a 68 y.o. female with a hx of HTN, HL, diastolic CHF, CAD OSA and AFib. LHC in 10/2011 demonstrated non-obstructive disease and an 80% lesion in a small D1 treated medically. She was admitted in 02/2012 for a NSTEMI in the setting of AFib with RVR. This was felt to be demand ischemia. She has bradycardia in NSR precluding the use of rate controlling medications. She had evidence of apical hypertrophy on Echo at that time and QT prolongation. The only antiarrhythmic option was Amiodarone. She recently noted worsening dyspnea with exertion. She described NYHA Class 3 symptoms. She was seen in f/u with Dr. Gwenette Greet. It is not clear if her dyspnea is related to amiodarone. Her ESR was elevated. She had significant elevated PA pressures on echo and subsequently underwent RHC which showed mild pulmonary HTN with PAP 45/50mHg. She presents back today for followup. She was evaluated by Dr. CGwenette Greetand it was felt that her SOB may be due to [facility]icity with elevated ESR and her Amio was stopped. She now presents today for followup. The patient denies any chest pain, syncope, orthopnea, PND. She says that since coming off of the amio her SOB is better. She still has some chronic LE edema in her ankles which is controlled on diuretics. She is doing well with her BIPAP.  She now is using a nasal mask and has a chin strap but does not wear it.    Wt Readings from Last 3 Encounters:  10/12/13 208 lb 12.8 oz (94.711 kg)  07/11/13 208 lb 6.4 oz (94.53 kg)  06/01/13 207 lb (93.895 kg)     Past Medical History  Diagnosis Date  . Hypertension   . Hyperlipidemia   . Hypothyroid   . COPD (chronic obstructive pulmonary disease)     . Dizziness     had PT for being off balance - in approx. 2012  . Lumbar disc disease   . Macular degeneration   . Chronic diastolic heart failure   . Apical variant hypertrophic cardiomyopathy     Diagnosed by echo and ECG 11/13  . GERD (gastroesophageal reflux disease)   . Paroxysmal atrial fibrillation/flutter        . Sinus bradycardia     Current Outpatient Prescriptions  Medication Sig Dispense Refill  . albuterol (PROVENTIL HFA;VENTOLIN HFA) 108 (90 BASE) MCG/ACT inhaler Inhale 2 puffs into the lungs every 6 (six) hours as needed for wheezing or shortness of breath. Shortness of breath      . atorvastatin (LIPITOR) 40 MG tablet Take 40 mg by mouth daily.       . cholecalciferol (VITAMIN D) 1000 UNITS tablet Take 1,000 Units by mouth daily.      . furosemide (LASIX) 20 MG tablet Take 20 mg by mouth daily.      .Marland KitchenGARLIC PO Take 1 tablet by mouth daily.      .Marland Kitchenlevothyroxine (SYNTHROID, LEVOTHROID) 88 MCG tablet Take 88 mcg by mouth daily.       . nitroGLYCERIN (NITROSTAT) 0.4 MG SL tablet Place 0.4 mg under the tongue every 5 (five) minutes as needed for chest pain.      . Rivaroxaban (XARELTO) 20  MG TABS Take 1 tablet (20 mg total) by mouth daily with supper.  30 tablet  0   No current facility-administered medications for this visit.    Allergies:    Allergies  Allergen Reactions  . Zyban [Bupropion] Itching    Social History:  The patient  reports that she quit smoking about 4 years ago. Her smoking use included Cigarettes. She has a 13.2 pack-year smoking history. She has never used smokeless tobacco. She reports that she drinks alcohol. She reports that she does not use illicit drugs.   Family History:  The patient's family history includes Breast cancer in her paternal aunt; Gout in her father; Heart disease in her sister; Lung cancer in her father.   ROS:  Please see the history of present illness.      All other systems reviewed and negative.   PHYSICAL EXAM: VS:   BP 132/82  Pulse 60  Ht 5' 3" (1.6 m)  Wt 208 lb 12.8 oz (94.711 kg)  BMI 37.00 kg/m2 Well nourished, well developed, in no acute distress HEENT: normal Neck: no JVD Cardiac:  normal S1, S2; RRR; no murmur Lungs:  clear to auscultation bilaterally, no wheezing, rhonchi or rales Abd: soft, nontender, no hepatomegaly Ext: no edema Skin: warm and dry Neuro:  CNs 2-12 intact, no focal abnormalities noted  ASSESSMENT AND PLAN:  1. Dyspnea: As noted, the patient has had worsening dyspnea. There is a question of whether she has Amiodarone toxicity. RHC showed only mildly increased PA pressures felt secondary to OSA. Her SOB has improved some since coming off of the amiodarone.   2. Atrial Fibrillation: She is maintaining NSR. She did have an elevated ESR recently and Amiodarone has been stopped. If she has reoccurence of her afib Dr. Caryl Comes did mention possibly seeing Dr. Lehman Prom at [facility] for ablation. Conitnue Xarelto 3. CAD: No anginal CP but continues with SOB of unclear etiology.  She has a known 80% diagonal branch stenosis.  I will get a lexiscan myoview to rule out ischemia. She is not on aspirin as she is on Xarelto. She cannot tolerate beta blocker due to bradycardia. Continue statin. 4. Hypertension: Controlled. 5. Sleep apnea: She remains on BiPAP and doing well with it 6. Chronic Diastolic CHF: Volume appears stable. Continue current therapy. - check BMET today  Dollowup with me in 6 months     Signed, Fransico Him, MD 10/12/2013 3:35 PM

## 2013-10-13 LAB — BASIC METABOLIC PANEL
BUN: 23 mg/dL (ref 6–23)
CALCIUM: 9.3 mg/dL (ref 8.4–10.5)
CO2: 28 mEq/L (ref 19–32)
CREATININE: 1.4 mg/dL — AB (ref 0.4–1.2)
Chloride: 111 mEq/L (ref 96–112)
GFR: 48.03 mL/min — ABNORMAL LOW (ref >60.00)
Glucose, Bld: 78 mg/dL (ref 70–99)
Potassium: 4.4 mEq/L (ref 3.5–5.1)
Sodium: 141 mEq/L (ref 135–145)

## 2013-10-24 ENCOUNTER — Ambulatory Visit (HOSPITAL_COMMUNITY): Payer: Medicare Other | Attending: Cardiology | Admitting: Radiology

## 2013-10-24 VITALS — BP 141/72 | HR 51 | Ht 63.0 in | Wt 208.0 lb

## 2013-10-24 DIAGNOSIS — R0602 Shortness of breath: Secondary | ICD-10-CM | POA: Diagnosis not present

## 2013-10-24 DIAGNOSIS — I252 Old myocardial infarction: Secondary | ICD-10-CM | POA: Insufficient documentation

## 2013-10-24 DIAGNOSIS — R079 Chest pain, unspecified: Secondary | ICD-10-CM

## 2013-10-24 DIAGNOSIS — I4891 Unspecified atrial fibrillation: Secondary | ICD-10-CM | POA: Insufficient documentation

## 2013-10-24 DIAGNOSIS — R42 Dizziness and giddiness: Secondary | ICD-10-CM | POA: Diagnosis not present

## 2013-10-24 DIAGNOSIS — Z87891 Personal history of nicotine dependence: Secondary | ICD-10-CM | POA: Insufficient documentation

## 2013-10-24 DIAGNOSIS — I2510 Atherosclerotic heart disease of native coronary artery without angina pectoris: Secondary | ICD-10-CM | POA: Insufficient documentation

## 2013-10-24 DIAGNOSIS — Z8249 Family history of ischemic heart disease and other diseases of the circulatory system: Secondary | ICD-10-CM | POA: Insufficient documentation

## 2013-10-24 DIAGNOSIS — I10 Essential (primary) hypertension: Secondary | ICD-10-CM | POA: Insufficient documentation

## 2013-10-24 MED ORDER — REGADENOSON 0.4 MG/5ML IV SOLN
0.4000 mg | Freq: Once | INTRAVENOUS | Status: AC
  Administered 2013-10-24: 0.4 mg via INTRAVENOUS

## 2013-10-24 MED ORDER — TECHNETIUM TC 99M SESTAMIBI GENERIC - CARDIOLITE
30.0000 | Freq: Once | INTRAVENOUS | Status: AC | PRN
  Administered 2013-10-24: 30 via INTRAVENOUS

## 2013-10-24 NOTE — Progress Notes (Signed)
[place] [address] [place], [place] [address] [phone]    Cardiology Nuclear Med Study  Amy [name] is a 68 y.o. female     MRN : [id number]     DOB: 1946/01/02  Procedure Date: 10/24/2013  Nuclear Med Background Indication for Stress Test:  Evaluation for Ischemia History: Cath: Nonobstructive CAD,(80% D1), MI, AFIB, 2013 Myocardial Perfusion Imaging-Normal, EF=81% ([facility]), and 04-2013 Echo: EF=55-60% Cardiac Risk Factors: Family History - CAD, History of Smoking, Hypertension and Lipids  Symptoms: Chest Pain with/without exertion (last occurrence 2 days ago), Dizziness, DOE and SOB   Nuclear Pre-Procedure Caffeine/Decaff Intake:  8:00pm NPO After: 8:00pm   Lungs:  clear O2 Sat: 94-97% on room air. IV 0.9% NS with Angio Cath:  22g  IV Site: R Hand  IV Started by:  Matilde Haymaker, RN  Chest Size (in):  42 Cup Size: G  Height: _0  (1.6 m)  Weight:  208 lb (94.348 kg)  BMI:  Body mass index is 36.85 kg/(m^2). Tech Comments:  n/a    Nuclear Med Study 1 or 2 day study: 2 day  Stress Test Type:  Lexiscan  Reading MD: n/a  Order Authorizing Provider: Vonna Drafts  Resting Radionuclide: Technetium 40mSestamibi  Resting Radionuclide Dose: 33.0 mCi   On     10-27-13  Stress Radionuclide:  Technetium 954mestamibi  Stress Radionuclide Dose: 33.0 mCi   On        10-24-13          Stress Protocol Rest HR: 51 Stress HR: 70  Rest BP: 141/72 Stress BP: 143/68  Exercise Time (min): n/a METS: n/a   Predicted Max HR: 152 bpm % Max HR: 46.05 bpm Rate Pressure Product: 10010   Dose of Adenosine (mg):  n/a Dose of Lexiscan: 0.4 mg  Dose of Atropine (mg): n/a Dose of Dobutamine: n/a mcg/kg/min (at max HR)  Stress Test Technologist: PaIrven BaltimoreRN  Nuclear Technologist:  ElVedia PereyraCNMT     Rest Procedure:  Myocardial perfusion imaging was performed at rest 45 minutes following the intravenous administration of Technetium  9972mstamibi. Rest ECG: NSR LVH marked repolarization abnormalities  Stress Procedure:  The patient received IV Lexiscan 0.4 mg over 15-seconds.  Technetium 99m84mtamibi injected at 30-seconds.  The patient complained of stomach pressure with Lexiscan, but denied chest pain.Quantitative spect images were obtained after a 45 minute delay. Stress ECG: No significant change from baseline ECG  QPS Raw Data Images:  Normal; no motion artifact; normal heart/lung ratio. Stress Images:  Normal homogeneous uptake in all areas of the myocardium. Rest Images:  Normal homogeneous uptake in all areas of the myocardium. Subtraction (SDS):  No evidence of ischemia. Transient Ischemic Dilatation (Normal <1.22):  0.99 Lung/Heart Ratio (Normal <0.45):  0.27  Quantitative Gated Spect Images QGS EDV:  NA QGS ESV:  NA  Impression Exercise Capacity:  Lexiscan with no exercise. BP Response:  Normal blood pressure response. Clinical Symptoms:  Stomach pain ECG Impression:  No significant ST segment change suggestive of ischemia. Comparison with Prior Nuclear Study: No images to compare  Overall Impression:  Normal stress nuclear study.  LV Ejection Fraction: Study not gated.  LV Wall Motion:  NA   PeteJenkins [place]

## 2013-10-27 ENCOUNTER — Ambulatory Visit (HOSPITAL_COMMUNITY): Payer: Medicare Other | Attending: Cardiology

## 2013-10-27 DIAGNOSIS — R0989 Other specified symptoms and signs involving the circulatory and respiratory systems: Secondary | ICD-10-CM

## 2013-10-27 MED ORDER — TECHNETIUM TC 99M SESTAMIBI GENERIC - CARDIOLITE
33.0000 | Freq: Once | INTRAVENOUS | Status: AC | PRN
  Administered 2013-10-27: 33 via INTRAVENOUS

## 2013-10-28 ENCOUNTER — Telehealth: Payer: Self-pay | Admitting: Cardiology

## 2013-10-28 NOTE — Progress Notes (Signed)
lmtrc

## 2013-10-28 NOTE — Telephone Encounter (Signed)
New message  ° ° °Patient calling for test results.   °

## 2013-10-28 NOTE — Telephone Encounter (Signed)
Notified of stress test results by [facility]

## 2013-12-08 ENCOUNTER — Other Ambulatory Visit: Payer: Self-pay | Admitting: Family Medicine

## 2013-12-08 DIAGNOSIS — E2839 Other primary ovarian failure: Secondary | ICD-10-CM

## 2013-12-08 DIAGNOSIS — E559 Vitamin D deficiency, unspecified: Secondary | ICD-10-CM | POA: Diagnosis not present

## 2013-12-08 DIAGNOSIS — Z0000 Encounter for general adult medical examination without abnormal findings: Secondary | ICD-10-CM | POA: Diagnosis not present

## 2013-12-08 DIAGNOSIS — I10 Essential (primary) hypertension: Secondary | ICD-10-CM | POA: Diagnosis not present

## 2013-12-08 DIAGNOSIS — I4891 Unspecified atrial fibrillation: Secondary | ICD-10-CM | POA: Diagnosis not present

## 2013-12-08 DIAGNOSIS — Z23 Encounter for immunization: Secondary | ICD-10-CM | POA: Diagnosis not present

## 2013-12-08 DIAGNOSIS — E785 Hyperlipidemia, unspecified: Secondary | ICD-10-CM | POA: Diagnosis not present

## 2013-12-08 DIAGNOSIS — E039 Hypothyroidism, unspecified: Secondary | ICD-10-CM | POA: Diagnosis not present

## 2013-12-08 DIAGNOSIS — Z1231 Encounter for screening mammogram for malignant neoplasm of breast: Secondary | ICD-10-CM

## 2013-12-13 ENCOUNTER — Other Ambulatory Visit: Payer: Self-pay | Admitting: Cardiology

## 2013-12-27 ENCOUNTER — Ambulatory Visit
Admission: RE | Admit: 2013-12-27 | Discharge: 2013-12-27 | Disposition: A | Discharge status: Home / Self Care | Payer: Medicare Other | Source: Ambulatory Visit | Attending: Family Medicine | Admitting: Family Medicine

## 2013-12-27 ENCOUNTER — Other Ambulatory Visit: Payer: Self-pay | Admitting: Family Medicine

## 2013-12-27 DIAGNOSIS — E2839 Other primary ovarian failure: Secondary | ICD-10-CM

## 2013-12-27 DIAGNOSIS — Z1231 Encounter for screening mammogram for malignant neoplasm of breast: Secondary | ICD-10-CM

## 2013-12-27 DIAGNOSIS — Z13820 Encounter for screening for osteoporosis: Secondary | ICD-10-CM | POA: Diagnosis not present

## 2013-12-27 DIAGNOSIS — Z780 Asymptomatic menopausal state: Secondary | ICD-10-CM | POA: Diagnosis not present

## 2014-03-06 DIAGNOSIS — M79675 Pain in left toe(s): Secondary | ICD-10-CM | POA: Diagnosis not present

## 2014-03-06 DIAGNOSIS — E039 Hypothyroidism, unspecified: Secondary | ICD-10-CM | POA: Diagnosis not present

## 2014-03-16 ENCOUNTER — Encounter (HOSPITAL_COMMUNITY): Payer: Self-pay | Admitting: Cardiology

## 2014-03-17 DIAGNOSIS — M79605 Pain in left leg: Secondary | ICD-10-CM | POA: Diagnosis not present

## 2014-03-17 DIAGNOSIS — M10072 Idiopathic gout, left ankle and foot: Secondary | ICD-10-CM | POA: Diagnosis not present

## 2014-03-20 ENCOUNTER — Other Ambulatory Visit: Payer: Self-pay | Admitting: *Deleted

## 2014-03-20 MED ORDER — FUROSEMIDE 20 MG PO TABS: 20.0000 mg | ORAL_TABLET | Freq: Every day | ORAL | Status: DC

## 2014-04-12 ENCOUNTER — Encounter: Payer: Self-pay | Admitting: Cardiology

## 2014-04-12 ENCOUNTER — Ambulatory Visit (INDEPENDENT_AMBULATORY_CARE_PROVIDER_SITE_OTHER): Payer: Medicare Other | Admitting: Cardiology

## 2014-04-12 VITALS — BP 140/60 | HR 63 | Ht 63.0 in | Wt 210.0 lb

## 2014-04-12 DIAGNOSIS — G4733 Obstructive sleep apnea (adult) (pediatric): Secondary | ICD-10-CM | POA: Diagnosis not present

## 2014-04-12 DIAGNOSIS — I480 Paroxysmal atrial fibrillation: Secondary | ICD-10-CM | POA: Diagnosis not present

## 2014-04-12 DIAGNOSIS — R0602 Shortness of breath: Secondary | ICD-10-CM

## 2014-04-12 DIAGNOSIS — I270 Primary pulmonary hypertension: Secondary | ICD-10-CM

## 2014-04-12 DIAGNOSIS — I422 Other hypertrophic cardiomyopathy: Secondary | ICD-10-CM

## 2014-04-12 DIAGNOSIS — I5032 Chronic diastolic (congestive) heart failure: Secondary | ICD-10-CM | POA: Diagnosis not present

## 2014-04-12 DIAGNOSIS — I272 Pulmonary hypertension, unspecified: Secondary | ICD-10-CM

## 2014-04-12 DIAGNOSIS — E785 Hyperlipidemia, unspecified: Secondary | ICD-10-CM | POA: Diagnosis not present

## 2014-04-12 DIAGNOSIS — I10 Essential (primary) hypertension: Secondary | ICD-10-CM | POA: Diagnosis not present

## 2014-04-12 LAB — BASIC METABOLIC PANEL
BUN: 12 mg/dL (ref 6–23)
CHLORIDE: 108 meq/L (ref 96–112)
CO2: 24 mEq/L (ref 19–32)
Calcium: 9.1 mg/dL (ref 8.4–10.5)
Creatinine, Ser: 1.3 mg/dL — ABNORMAL HIGH (ref 0.4–1.2)
GFR: 54.16 mL/min — AB (ref >60.00)
GLUCOSE: 76 mg/dL (ref 70–99)
POTASSIUM: 4 meq/L (ref 3.5–5.1)
SODIUM: 139 meq/L (ref 135–145)

## 2014-04-12 LAB — CBC WITH DIFFERENTIAL/PLATELET
BASOS ABS: 0 10*3/uL (ref 0.0–0.1)
Basophils Relative: 0.4 % (ref 0.0–3.0)
EOS PCT: 1 % (ref 0.0–5.0)
Eosinophils Absolute: 0.1 10*3/uL (ref 0.0–0.7)
HCT: 42 % (ref 36.0–46.0)
HEMOGLOBIN: 13.4 g/dL (ref 12.0–15.0)
LYMPHS ABS: 2.4 10*3/uL (ref 0.7–4.0)
LYMPHS PCT: 30.8 % (ref 12.0–46.0)
MCHC: 31.9 g/dL (ref 30.0–36.0)
MCV: 91.1 fl (ref 78.0–100.0)
MONO ABS: 0.6 10*3/uL (ref 0.1–1.0)
MONOS PCT: 8.3 % (ref 3.0–12.0)
NEUTROS PCT: 59.5 % (ref 43.0–77.0)
Neutro Abs: 4.7 10*3/uL (ref 1.4–7.7)
Platelets: 290 10*3/uL (ref 150.0–400.0)
RBC: 4.62 Mil/uL (ref 3.87–5.11)
RDW: 17.4 % — AB (ref 11.5–15.5)
WBC: 7.8 10*3/uL (ref 4.0–10.5)

## 2014-04-12 LAB — BRAIN NATRIURETIC PEPTIDE: PRO B NATRI PEPTIDE: 156 pg/mL — AB (ref 0.0–100.0)

## 2014-04-12 NOTE — Patient Instructions (Signed)
Your physician recommends that you have lab work TODAY (CBC, BMET, BNP).  Your physician recommends that you continue on your current medications as directed. Please refer to the Current Medication list given to you today.  Your physician wants you to follow-up in: 6 months with Dr. Radford Pax. You will receive a reminder letter in the mail two months in advance. If you don't receive a letter, please call our office to schedule the follow-up appointment.

## 2014-04-12 NOTE — Progress Notes (Signed)
[place], [place], [place]  [address] Phone: [phone] Fax:  [phone]  Date:  04/12/2014   ID:  Amy [name], DOB 1945-07-02, MRN [id number]  PCP:  [name], MD  Cardiologist:  Fransico Him, MD    History of Present Illness: Amy [name] is a 69 y.o. female with a hx of HTN, HL, diastolic CHF, CAD OSA and AFib. LHC in 10/2011 demonstrated non-obstructive disease and an 80% lesion in a small D1 treated medically. She was admitted in 02/2012 for a NSTEMI in the setting of AFib with RVR. This was felt to be demand ischemia. She has bradycardia in NSR precluding the use of rate controlling medications. She had evidence of apical hypertrophy on Echo at that time and QT prolongation. The only antiarrhythmic option was Amiodarone. She recently noted worsening dyspnea with exertion. She described NYHA Class 3 symptoms. She was seen in f/u with Dr. Gwenette Greet. It is not clear if her dyspnea is related to amiodarone. Her ESR was elevated. She had significant elevated PA pressures on echo and subsequently underwent RHC which showed mild pulmonary HTN with PAP 45/90mHg.  She was evaluated by Dr. CGwenette Greetand it was felt that her SOB may be due to [facility] with elevated ESR and her Amio was stopped. She now presents today for followup. The patient denies any chest pain, syncope, orthopnea, PND. She says that since coming off of the amio her SOB is better but not resolved. She still has some chronic LE edema in her ankles which is controlled on diuretics. She is doing well with her BIPAP. She now is using a nasal mask and has a chin strap but does not wear it. She wakes up frequently at night because of nasal drainage and does not feel rested when she gets up in the am.     Wt Readings from Last 3 Encounters:  04/12/14 210 lb (95.255 kg)  10/24/13 208 lb (94.348 kg)  10/12/13 208 lb 12.8 oz (94.711 kg)     Past Medical History  Diagnosis Date  . Hypertension   .  Hyperlipidemia   . Hypothyroid   . COPD (chronic obstructive pulmonary disease)   . Dizziness     had PT for being off balance - in approx. 2012  . Lumbar disc disease   . Macular degeneration   . Chronic diastolic heart failure   . Apical variant hypertrophic cardiomyopathy     Diagnosed by echo and ECG 11/13  . GERD (gastroesophageal reflux disease)   . Paroxysmal atrial fibrillation/flutter        . Sinus bradycardia     Current Outpatient Prescriptions  Medication Sig Dispense Refill  . albuterol (PROVENTIL HFA;VENTOLIN HFA) 108 (90 BASE) MCG/ACT inhaler Inhale 2 puffs into the lungs every 6 (six) hours as needed for wheezing or shortness of breath. Shortness of breath    . atorvastatin (LIPITOR) 40 MG tablet Take 40 mg by mouth daily.     . Cholecalciferol (VITAMIN D) 2000 UNITS CAPS Take 1 capsule by mouth daily.    . furosemide (LASIX) 20 MG tablet Take 1 tablet (20 mg total) by mouth daily. 30 tablet 5  . GARLIC PO Take 1 tablet by mouth daily.    .Javier DockerOil 1000 MG CAPS Take 1 capsule by mouth daily.    .Marland Kitchenlevothyroxine (SYNTHROID, LEVOTHROID) 88 MCG tablet Take 88 mcg by mouth daily.     . nitroGLYCERIN (NITROSTAT) 0.4 MG SL tablet Place  0.4 mg under the tongue every 5 (five) minutes as needed for chest pain.    Marland Kitchen XARELTO 20 MG TABS tablet TAKE 1 TABLET BY MOUTH ONCE A DAY PER (DR.TURNER) 30 tablet 4   No current facility-administered medications for this visit.    Allergies:    Allergies  Allergen Reactions  . Zyban [Bupropion] Itching    Social History:  The patient  reports that she quit smoking about 5 years ago. Her smoking use included Cigarettes. She has a 13.2 pack-year smoking history. She has never used smokeless tobacco. She reports that she drinks alcohol. She reports that she does not use illicit drugs.   Family History:  The patient's family history includes Breast cancer in her paternal aunt; Gout in her father; Heart disease in her sister; Lung cancer  in her father.   ROS:  Please see the history of present illness.      All other systems reviewed and negative.   PHYSICAL EXAM: VS:  BP 140/60 mmHg  Pulse 63  Ht _0  (1.6 m)  Wt 210 lb (95.255 kg)  BMI 37.21 kg/m2 Well nourished, well developed, in no acute distress HEENT: normal Neck: no JVD Cardiac:  normal S1, S2; RRR; no murmur Lungs:  clear to auscultation bilaterally, no wheezing, rhonchi or rales Abd: soft, nontender, no hepatomegaly Ext: no edema Skin: warm and dry Neuro:  CNs 2-12 intact, no focal abnormalities noted  EKG:  NSR with PAC's and LVH by voltage with repolarization abnormality     ASSESSMENT AND PLAN:  1. Dyspnea:  RHC showed only mildly increased PA pressures felt secondary to OSA. Her SOB has improved some since coming off of the amiodarone. 2. Atrial Fibrillation: She is maintaining NSR. She did have an elevated ESR recently and Amiodarone has been stopped. If she has reoccurence of her afib Dr. Caryl Comes did mention possibly seeing Dr. Lehman Prom at [facility] for ablation. Conitnue Xarelto.  Check NOAC 3. CAD: No anginal CP. She is not on aspirin as she is on Xarelto. She cannot tolerate beta blocker due to bradycardia. Continue statin. 4. Hypertension: Controlled. 5. Sleep apnea: She remains on BiPAP and doing well with it.  I will get a d/l from her PCP 6. Chronic Diastolic CHF: Volume appears stable. Continue current therapy. - check BMET today.  I will also check a BNP since she still has some SOB.  I have encouraged her to take her Lasix daily 7.   Dyslipidemia - continue atorvastatin.   I will get a copy of her last FLP from PCP  Dollowup with me in 6 months     Signed, Fransico Him, MD [facility] 04/12/2014 1:58 PM

## 2014-04-14 ENCOUNTER — Telehealth: Payer: Self-pay

## 2014-04-14 DIAGNOSIS — I5032 Chronic diastolic (congestive) heart failure: Secondary | ICD-10-CM

## 2014-04-14 DIAGNOSIS — I10 Essential (primary) hypertension: Secondary | ICD-10-CM

## 2014-04-14 MED ORDER — FUROSEMIDE 20 MG PO TABS: 20.0000 mg | ORAL_TABLET | Freq: Two times a day (BID) | ORAL | Status: DC

## 2014-04-14 NOTE — Telephone Encounter (Signed)
-----  Message from Sueanne Margarita, MD sent at 04/13/2014  9:02 AM EST ----- BNP mildly elevated - increase Lasix to 54m BID and recheck BNP and BMET in 1 week

## 2014-04-14 NOTE — Telephone Encounter (Signed)
Patient informed of results and verbal understanding expressed.  Instructed patient to INCREASE lasix to 20 mg BID. Lab appointment scheduled for 1/15.

## 2014-04-21 ENCOUNTER — Other Ambulatory Visit (INDEPENDENT_AMBULATORY_CARE_PROVIDER_SITE_OTHER): Payer: Medicare Other | Admitting: *Deleted

## 2014-04-21 DIAGNOSIS — I10 Essential (primary) hypertension: Secondary | ICD-10-CM | POA: Diagnosis not present

## 2014-04-21 DIAGNOSIS — R0600 Dyspnea, unspecified: Secondary | ICD-10-CM | POA: Diagnosis not present

## 2014-04-21 LAB — BASIC METABOLIC PANEL
BUN: 24 mg/dL — ABNORMAL HIGH (ref 6–23)
CALCIUM: 9.6 mg/dL (ref 8.4–10.5)
CO2: 22 mEq/L (ref 19–32)
Chloride: 108 mEq/L (ref 96–112)
Creatinine, Ser: 1.26 mg/dL — ABNORMAL HIGH (ref 0.40–1.20)
GFR: 54.15 mL/min — ABNORMAL LOW (ref >60.00)
Glucose, Bld: 80 mg/dL (ref 70–99)
POTASSIUM: 4.4 meq/L (ref 3.5–5.1)
Sodium: 141 mEq/L (ref 135–145)

## 2014-04-21 LAB — BRAIN NATRIURETIC PEPTIDE: Pro B Natriuretic peptide (BNP): 86 pg/mL (ref 0.0–100.0)

## 2014-05-01 ENCOUNTER — Telehealth: Payer: Self-pay | Admitting: Cardiology

## 2014-05-01 ENCOUNTER — Encounter: Payer: Self-pay | Admitting: Cardiology

## 2014-05-01 NOTE — Telephone Encounter (Signed)
  This encounter was created in error - please disregard.

## 2014-05-01 NOTE — Telephone Encounter (Signed)
Please let patient know that last lipid from PCP showed an LDL or 96 which is not at her goal of <70.  Please have her come in to repeat FLP and ALT to see if we need to changes her meds

## 2014-06-03 DIAGNOSIS — R0602 Shortness of breath: Secondary | ICD-10-CM | POA: Diagnosis not present

## 2014-06-07 ENCOUNTER — Telehealth: Payer: Self-pay

## 2014-06-07 DIAGNOSIS — I10 Essential (primary) hypertension: Secondary | ICD-10-CM | POA: Diagnosis not present

## 2014-06-07 DIAGNOSIS — E785 Hyperlipidemia, unspecified: Secondary | ICD-10-CM

## 2014-06-07 DIAGNOSIS — I480 Paroxysmal atrial fibrillation: Secondary | ICD-10-CM | POA: Diagnosis not present

## 2014-06-07 DIAGNOSIS — G4733 Obstructive sleep apnea (adult) (pediatric): Secondary | ICD-10-CM | POA: Diagnosis not present

## 2014-06-07 DIAGNOSIS — E039 Hypothyroidism, unspecified: Secondary | ICD-10-CM | POA: Diagnosis not present

## 2014-06-07 NOTE — Telephone Encounter (Signed)
Per Dr. Radford Pax, "lipid from PCP showed an LDL or 96 which is not at her goal of <70. Please have her come in to repeat FLP and ALT to see if we need to changes her meds."  Left message for patient to call back.

## 2014-06-07 NOTE — Telephone Encounter (Signed)
FLP and ALT scheduled for this Friday per patient request.  Per patient, OV scheduled for Monday. Her PCP recently increased her Lasix and she would like a check up.

## 2014-06-09 ENCOUNTER — Other Ambulatory Visit (INDEPENDENT_AMBULATORY_CARE_PROVIDER_SITE_OTHER): Payer: Medicare Other | Admitting: *Deleted

## 2014-06-09 DIAGNOSIS — E785 Hyperlipidemia, unspecified: Secondary | ICD-10-CM | POA: Diagnosis not present

## 2014-06-09 LAB — LIPID PANEL
CHOL/HDL RATIO: 4
CHOLESTEROL: 199 mg/dL (ref 0–200)
HDL: 51.6 mg/dL (ref >39.00)
NONHDL: 147.4
Triglycerides: 282 mg/dL — ABNORMAL HIGH (ref 0.0–149.0)
VLDL: 56.4 mg/dL — AB (ref 0.0–40.0)

## 2014-06-09 LAB — ALT: ALT: 21 U/L (ref 0–35)

## 2014-06-09 LAB — LDL CHOLESTEROL, DIRECT: LDL DIRECT: 115 mg/dL

## 2014-06-11 NOTE — Progress Notes (Signed)
Cardiology Office Note   Date:  06/12/2014   ID:  Amy [name], DOB 10/05/1945, MRN [id number]  PCP:  [name], MD  Cardiologist:   Sueanne Margarita, MD   Chief Complaint  Patient presents with  . Coronary Artery Disease  . Hypertension  . Congestive Heart Failure      History of Present Illness: Amy [name] is a 69 y.o. female with a hx of HTN, HL, diastolic CHF, ASCAD with cath 7/3 with non obstructive ASCD - medical management, OSA and PAFib. LHC in 10/2011 demonstrated non-obstructive disease and an 80% lesion in a small D1 treated medically and mild pulmonary HTN with PAP 45/68mHg. She was evaluated by Dr. CGwenette Greetand it was felt that her SOB may be due to [facility] with elevated ESR and her Amio was stopped. She  presents today for followup.   She was seen in Urgent Care 2 weeks ago due to some increased SOB and chest pressure and chest xray showed mild CHF according to the patient and her Lasix to 626mdaily and her SOB and chest pressure have resolved.   The patient denies any chest pain, syncope, orthopnea, PND. She still has some chronic LE edema in her ankles which is controlled on diuretics. Most of her LE edema is from gout.  She is doing well with her BIPAP. She now is using a nasal mask and has a chin strap but does not wear it.     Past Medical History  Diagnosis Date  . Hypertension   . Hyperlipidemia   . Hypothyroid   . COPD (chronic obstructive pulmonary disease)   . Dizziness     had PT for being off balance - in approx. 2012  . Lumbar disc disease   . Macular degeneration   . Chronic diastolic heart failure   . Apical variant hypertrophic cardiomyopathy     Diagnosed by echo and ECG 11/13  . GERD (gastroesophageal reflux disease)   . Paroxysmal atrial fibrillation/flutter        . Sinus bradycardia     Past Surgical History  Procedure Laterality Date  . Cesarean section  1983  . Cardiac catheterization      normal  coronary arteries  . Cataract extraction Bilateral   . Right heart catheterization N/A 06/01/2013    Procedure: RIGHT HEART CATH;  Surgeon: DaLarey DresserMD;  [facility];  Service: Cardiovascular;  Laterality: N/A;     Current Outpatient Prescriptions  Medication Sig Dispense Refill  . albuterol (PROVENTIL HFA;VENTOLIN HFA) 108 (90 BASE) MCG/ACT inhaler Inhale 2 puffs into the lungs every 6 (six) hours as needed for wheezing or shortness of breath. Shortness of breath    . amoxicillin (AMOXIL) 500 MG capsule Take 500 mg by mouth 2 (two) times daily.    . Marland Kitchentorvastatin (LIPITOR) 80 MG tablet Take 1 tablet (80 mg total) by mouth daily. 30 tablet 6  . Cholecalciferol (VITAMIN D) 2000 UNITS CAPS Take 1 capsule by mouth daily.    . colchicine 0.6 MG tablet Take 0.6 mg by mouth daily.    . fluconazole (DIFLUCAN) 150 MG tablet Take 150 mg by mouth daily.    . furosemide (LASIX) 20 MG tablet Take 1 tablet (20 mg total) by mouth 3 (three) times daily. 60 tablet 5  . GARLIC PO Take 1 tablet by mouth daily.    . Javier Dockeril 1000 MG CAPS Take 1 capsule by mouth daily.    .Marland Kitchen  levothyroxine (SYNTHROID, LEVOTHROID) 75 MCG tablet Take 75 mcg by mouth daily before breakfast.    . nitroGLYCERIN (NITROSTAT) 0.4 MG SL tablet Place 0.4 mg under the tongue every 5 (five) minutes as needed for chest pain.    . predniSONE (DELTASONE) 20 MG tablet Take 20 mg by mouth daily with breakfast. Take 1 tablet by mouth every day for 5 days    . XARELTO 20 MG TABS tablet TAKE 1 TABLET BY MOUTH ONCE A DAY PER (DR.Jhoel Stieg) 30 tablet 4   No current facility-administered medications for this visit.    Allergies:   Zyban    Social History:  The patient  reports that she quit smoking about 5 years ago. Her smoking use included Cigarettes. She has a 13.2 pack-year smoking history. She has never used smokeless tobacco. She reports that she drinks alcohol. She reports that she does not use illicit drugs.   Family  History:  The patient's family history includes Breast cancer in her paternal aunt; Gout in her father; Heart disease in her sister; Lung cancer in her father.    ROS:  Please see the history of present illness.   Otherwise, review of systems are positive for none.   All other systems are reviewed and negative.    PHYSICAL EXAM: VS:  BP 128/72 mmHg  Pulse 58  Ht _0  (1.6 m)  Wt 214 lb 12.8 oz (97.433 kg)  BMI 38.06 kg/m2  SpO2 95% , BMI Body mass index is 38.06 kg/(m^2). GEN: Well nourished, well developed, in no acute distress HEENT: normal Neck: no JVD, carotid bruits, or masses Cardiac: RRR; no murmurs, rubs, or gallops,no edema  Respiratory:  clear to auscultation bilaterally, normal work of breathing GI: soft, nontender, nondistended, + BS MS: no deformity or atrophy Skin: warm and dry, no rash Neuro:  Strength and sensation are intact Psych: euthymic mood, full affect   EKG:  EKG is not ordered today.    Recent Labs: 04/12/2014: Hemoglobin 13.4; Platelets 290.0 04/21/2014: BUN 24*; Creatinine 1.26*; Potassium 4.4; Pro B Natriuretic peptide (BNP) 86.0; Sodium 141 06/09/2014: ALT 21    Lipid Panel    Component Value Date/Time   CHOL 199 06/09/2014 1449   TRIG 282.0* 06/09/2014 1449   HDL 51.60 06/09/2014 1449   CHOLHDL 4 06/09/2014 1449   VLDL 56.4* 06/09/2014 1449   LDLDIRECT 115.0 06/09/2014 1449      Wt Readings from Last 3 Encounters:  06/12/14 214 lb 12.8 oz (97.433 kg)  04/12/14 210 lb (95.255 kg)  10/24/13 208 lb (94.348 kg)     ASSESSMENT AND PLAN:  1. Dyspnea: RHC showed only mildly increased PA pressures felt secondary to OSA. Her SOB has improved some since coming off of the amiodarone.  Last BNP was normal.   2. Atrial Fibrillation: She is maintaining NSR. She did have an elevated ESR recently and Amiodarone has been stopped. If she has reoccurence of her afib Dr. Caryl Comes did mention possibly seeing Dr. Lehman Prom at [facility] for ablation. Conitnue Xarelto.    3. CAD: No anginal CP since increasing Lasix  Nuclear stress test 10/2013 showed no ischemia. She is not on aspirin as she is on Xarelto. She cannot tolerate beta blocker due to bradycardia. Continue statin. 4. Hypertension: Controlled. 5. Sleep apnea: She remains on BiPAP and doing well with it.  Her d/l today showed an AHI of 2.8 and 77% compliance in using more than 4 hours nightly.   6. Chronic Diastolic CHF: Volume appears  stable after increasing Lasix. Continue current therapy.       7.   Dyslipidemia - increase atorvastatin to 17m daily since LDL not at goal (115).  I will recheck lipids in 6 weeks    Current medicines are reviewed at length with the patient today.  The patient does not have concerns regarding medicines.  The following changes have been made: None  Labs/ tests ordered today include: none   Orders Placed This Encounter  Procedures  . Basic Metabolic Panel (BMET)  . Hepatic function panel  . Lipid panel     Disposition:   FU with me in 6 months   Signed, TSueanne Margarita MD  06/12/2014 10:02 AM    [facility] [address] [place]  [id number]: [phone] Fax: [phone]

## 2014-06-12 ENCOUNTER — Encounter: Payer: Self-pay | Admitting: Cardiology

## 2014-06-12 ENCOUNTER — Ambulatory Visit (INDEPENDENT_AMBULATORY_CARE_PROVIDER_SITE_OTHER): Payer: Medicare Other | Admitting: Cardiology

## 2014-06-12 VITALS — BP 128/72 | HR 58 | Ht 63.0 in | Wt 214.8 lb

## 2014-06-12 DIAGNOSIS — I5032 Chronic diastolic (congestive) heart failure: Secondary | ICD-10-CM

## 2014-06-12 DIAGNOSIS — I480 Paroxysmal atrial fibrillation: Secondary | ICD-10-CM

## 2014-06-12 DIAGNOSIS — E785 Hyperlipidemia, unspecified: Secondary | ICD-10-CM

## 2014-06-12 DIAGNOSIS — I270 Primary pulmonary hypertension: Secondary | ICD-10-CM

## 2014-06-12 DIAGNOSIS — G4733 Obstructive sleep apnea (adult) (pediatric): Secondary | ICD-10-CM | POA: Diagnosis not present

## 2014-06-12 DIAGNOSIS — I10 Essential (primary) hypertension: Secondary | ICD-10-CM | POA: Diagnosis not present

## 2014-06-12 DIAGNOSIS — I272 Pulmonary hypertension, unspecified: Secondary | ICD-10-CM

## 2014-06-12 LAB — BASIC METABOLIC PANEL
BUN: 23 mg/dL (ref 6–23)
CALCIUM: 9 mg/dL (ref 8.4–10.5)
CO2: 29 mEq/L (ref 19–32)
CREATININE: 1.39 mg/dL — AB (ref 0.40–1.20)
Chloride: 104 mEq/L (ref 96–112)
GFR: 48.33 mL/min — ABNORMAL LOW (ref >60.00)
Glucose, Bld: 89 mg/dL (ref 70–99)
Potassium: 4.1 mEq/L (ref 3.5–5.1)
Sodium: 138 mEq/L (ref 135–145)

## 2014-06-12 MED ORDER — ATORVASTATIN CALCIUM 80 MG PO TABS: 80.0000 mg | ORAL_TABLET | Freq: Every day | ORAL | Status: DC

## 2014-06-12 MED ORDER — FUROSEMIDE 20 MG PO TABS: 20.0000 mg | ORAL_TABLET | Freq: Three times a day (TID) | ORAL | Status: DC

## 2014-06-12 NOTE — Patient Instructions (Addendum)
Your physician has recommended you make the following change in your medication:  1) INCREASE ATORVASTATIN to 80 mg daily   Your physician recommends that you return for FASTING lab work in: 6 weeks   Your physician recommends that you have lab work TODAY (BMET)  Your physician recommends that you schedule a follow-up appointment in: 3 months with Dr. Radford Pax.

## 2014-06-25 ENCOUNTER — Emergency Department (HOSPITAL_COMMUNITY)
Admission: EM | Admit: 2014-06-25 | Discharge: 2014-06-25 | Disposition: A | Discharge status: Home / Self Care | Payer: Medicare Other | Attending: Emergency Medicine | Admitting: Emergency Medicine

## 2014-06-25 ENCOUNTER — Encounter (HOSPITAL_COMMUNITY): Payer: Self-pay | Admitting: *Deleted

## 2014-06-25 ENCOUNTER — Emergency Department (HOSPITAL_COMMUNITY): Payer: Medicare Other

## 2014-06-25 DIAGNOSIS — E663 Overweight: Secondary | ICD-10-CM | POA: Insufficient documentation

## 2014-06-25 DIAGNOSIS — R002 Palpitations: Secondary | ICD-10-CM | POA: Diagnosis present

## 2014-06-25 DIAGNOSIS — J449 Chronic obstructive pulmonary disease, unspecified: Secondary | ICD-10-CM | POA: Diagnosis not present

## 2014-06-25 DIAGNOSIS — Z792 Long term (current) use of antibiotics: Secondary | ICD-10-CM | POA: Diagnosis not present

## 2014-06-25 DIAGNOSIS — Z87891 Personal history of nicotine dependence: Secondary | ICD-10-CM | POA: Insufficient documentation

## 2014-06-25 DIAGNOSIS — E039 Hypothyroidism, unspecified: Secondary | ICD-10-CM | POA: Diagnosis not present

## 2014-06-25 DIAGNOSIS — Z8719 Personal history of other diseases of the digestive system: Secondary | ICD-10-CM | POA: Insufficient documentation

## 2014-06-25 DIAGNOSIS — I480 Paroxysmal atrial fibrillation: Secondary | ICD-10-CM | POA: Insufficient documentation

## 2014-06-25 DIAGNOSIS — M79672 Pain in left foot: Secondary | ICD-10-CM | POA: Diagnosis not present

## 2014-06-25 DIAGNOSIS — I10 Essential (primary) hypertension: Secondary | ICD-10-CM | POA: Diagnosis not present

## 2014-06-25 DIAGNOSIS — Z7952 Long term (current) use of systemic steroids: Secondary | ICD-10-CM | POA: Diagnosis not present

## 2014-06-25 DIAGNOSIS — Z79899 Other long term (current) drug therapy: Secondary | ICD-10-CM | POA: Insufficient documentation

## 2014-06-25 DIAGNOSIS — E785 Hyperlipidemia, unspecified: Secondary | ICD-10-CM | POA: Diagnosis not present

## 2014-06-25 DIAGNOSIS — I5032 Chronic diastolic (congestive) heart failure: Secondary | ICD-10-CM | POA: Insufficient documentation

## 2014-06-25 DIAGNOSIS — Z9889 Other specified postprocedural states: Secondary | ICD-10-CM | POA: Insufficient documentation

## 2014-06-25 DIAGNOSIS — Z7901 Long term (current) use of anticoagulants: Secondary | ICD-10-CM | POA: Insufficient documentation

## 2014-06-25 DIAGNOSIS — R079 Chest pain, unspecified: Secondary | ICD-10-CM | POA: Diagnosis not present

## 2014-06-25 LAB — BASIC METABOLIC PANEL
ANION GAP: 8 (ref 5–15)
BUN: 14 mg/dL (ref 6–23)
CO2: 23 mmol/L (ref 19–32)
CREATININE: 1.27 mg/dL — AB (ref 0.50–1.10)
Calcium: 8.9 mg/dL (ref 8.4–10.5)
Chloride: 110 mmol/L (ref 96–112)
GFR calc Af Amer: 49 mL/min — ABNORMAL LOW (ref >90)
GFR, EST NON AFRICAN AMERICAN: 42 mL/min — AB (ref >90)
Glucose, Bld: 82 mg/dL (ref 70–99)
Potassium: 4.6 mmol/L (ref 3.5–5.1)
SODIUM: 141 mmol/L (ref 135–145)

## 2014-06-25 LAB — BRAIN NATRIURETIC PEPTIDE: B Natriuretic Peptide: 309.9 pg/mL — ABNORMAL HIGH (ref 0.0–100.0)

## 2014-06-25 LAB — I-STAT TROPONIN, ED: Troponin i, poc: 0.06 ng/mL (ref 0.00–0.08)

## 2014-06-25 LAB — CBC
HCT: 43.5 % (ref 36.0–46.0)
HEMOGLOBIN: 14.2 g/dL (ref 12.0–15.0)
MCH: 29.8 pg (ref 26.0–34.0)
MCHC: 32.6 g/dL (ref 30.0–36.0)
MCV: 91.4 fL (ref 78.0–100.0)
PLATELETS: 335 10*3/uL (ref 150–400)
RBC: 4.76 MIL/uL (ref 3.87–5.11)
RDW: 17.3 % — AB (ref 11.5–15.5)
WBC: 9.3 10*3/uL (ref 4.0–10.5)

## 2014-06-25 MED ORDER — SODIUM CHLORIDE 0.9 % IV BOLUS (SEPSIS)
500.0000 mL | Freq: Once | INTRAVENOUS | Status: AC
  Administered 2014-06-25: 500 mL via INTRAVENOUS

## 2014-06-25 MED ORDER — FLECAINIDE ACETATE 100 MG PO TABS: 300.0000 mg | ORAL_TABLET | Freq: Once | ORAL | Status: DC

## 2014-06-25 MED ORDER — METOPROLOL TARTRATE 1 MG/ML IV SOLN
5.0000 mg | Freq: Once | INTRAVENOUS | Status: AC
  Administered 2014-06-25: 5 mg via INTRAVENOUS
  Filled 2014-06-25: qty 5

## 2014-06-25 MED ORDER — METOPROLOL TARTRATE 25 MG PO TABS
25.0000 mg | ORAL_TABLET | Freq: Once | ORAL | Status: AC
  Administered 2014-06-25: 25 mg via ORAL
  Filled 2014-06-25: qty 1

## 2014-06-25 MED ORDER — FLECAINIDE ACETATE 100 MG PO TABS
300.0000 mg | ORAL_TABLET | Freq: Once | ORAL | Status: AC
  Administered 2014-06-25: 300 mg via ORAL
  Filled 2014-06-25: qty 3

## 2014-06-25 MED ORDER — METOPROLOL TARTRATE 25 MG PO TABS: 25.0000 mg | ORAL_TABLET | Freq: Two times a day (BID) | ORAL | Status: DC

## 2014-06-25 NOTE — Discharge Instructions (Signed)
Recheck with Dr. Radford Pax, your cardiologist.  Continue your Xarelto. New prescription for metoprolol, twice daily, to control your heart rate.   Atrial Fibrillation Atrial fibrillation is a type of irregular heart rhythm (arrhythmia). During atrial fibrillation, the upper chambers of the heart (atria) quiver continuously in a chaotic pattern. This causes an irregular and often rapid heart rate.  Atrial fibrillation is the result of the heart becoming overloaded with disorganized signals that tell it to beat. These signals are normally released one at a time by a part of the right atrium called the sinoatrial node. They then travel from the atria to the lower chambers of the heart (ventricles), causing the atria and ventricles to contract and pump blood as they pass. In atrial fibrillation, parts of the atria outside of the sinoatrial node also release these signals. This results in two problems. First, the atria receive so many signals that they do not have time to fully contract. Second, the ventricles, which can only receive one signal at a time, beat irregularly and out of rhythm with the atria.  There are three types of atrial fibrillation:   Paroxysmal. Paroxysmal atrial fibrillation starts suddenly and stops on its own within a week.  Persistent. Persistent atrial fibrillation lasts for more than a week. It may stop on its own or with treatment.  Permanent. Permanent atrial fibrillation does not go away. Episodes of atrial fibrillation may lead to permanent atrial fibrillation. Atrial fibrillation can prevent your heart from pumping blood normally. It increases your risk of stroke and can lead to heart failure.  CAUSES   Heart conditions, including a heart attack, heart failure, coronary artery disease, and heart valve conditions.   Inflammation of the sac that surrounds the heart (pericarditis).  Blockage of an artery in the lungs (pulmonary embolism).  Pneumonia or other  infections.  Chronic lung disease.  Thyroid problems, especially if the thyroid is overactive (hyperthyroidism).  Caffeine, excessive alcohol use, and use of some illegal drugs.   Use of some medicines, including certain decongestants and diet pills.  Heart surgery.   Birth defects.  Sometimes, no cause can be found. When this happens, the atrial fibrillation is called lone atrial fibrillation. The risk of complications from atrial fibrillation increases if you have lone atrial fibrillation and you are age 40 years or older. RISK FACTORS  Heart failure.  Coronary artery disease.  Diabetes mellitus.   High blood pressure (hypertension).   Obesity.   Other arrhythmias.   Increased age. SIGNS AND SYMPTOMS   A feeling that your heart is beating rapidly or irregularly.   A feeling of discomfort or pain in your chest.   Shortness of breath.   Sudden light-headedness or weakness.   Getting tired easily when exercising.   Urinating more often than normal (mainly when atrial fibrillation first begins).  In paroxysmal atrial fibrillation, symptoms may start and suddenly stop. DIAGNOSIS  Your health care provider may be able to detect atrial fibrillation when taking your pulse. Your health care provider may have you take a test called an ambulatory electrocardiogram (ECG). An ECG records your heartbeat patterns over a 24-hour period. You may also have other tests, such as:  Transthoracic echocardiogram (TTE). During echocardiography, sound waves are used to evaluate how blood flows through your heart.  Transesophageal echocardiogram (TEE).  Stress test. There is more than one type of stress test. If a stress test is needed, ask your health care provider about which type is best for you.  Chest  X-ray exam.  Blood tests.  Computed tomography (CT). TREATMENT  Treatment may include:  Treating any underlying conditions. For example, if you have an overactive  thyroid, treating the condition may correct atrial fibrillation.  Taking medicine. Medicines may be given to control a rapid heart rate or to prevent blood clots, heart failure, or a stroke.  Having a procedure to correct the rhythm of the heart:  Electrical cardioversion. During electrical cardioversion, a controlled, low-energy shock is delivered to the heart through your skin. If you have chest pain, very low blood pressure, or sudden heart failure, this procedure may need to be done as an emergency.  Catheter ablation. During this procedure, heart tissues that send the signals that cause atrial fibrillation are destroyed.  Surgical ablation. During this surgery, thin lines of heart tissue that carry the abnormal signals are destroyed. This procedure can either be an open-heart surgery or a minimally invasive surgery. With the minimally invasive surgery, small cuts are made to access the heart instead of a large opening.  Pulmonary venous isolation. During this surgery, tissue around the veins that carry blood from the lungs (pulmonary veins) is destroyed. This tissue is thought to carry the abnormal signals. HOME CARE INSTRUCTIONS   Take medicines only as directed by your health care provider. Some medicines can make atrial fibrillation worse or recur.  If blood thinners were prescribed by your health care provider, take them exactly as directed. Too much blood-thinning medicine can cause bleeding. If you take too little, you will not have the needed protection against stroke and other problems.  Perform blood tests at home if directed by your health care provider. Perform blood tests exactly as directed.  Quit smoking if you smoke.  Do not drink alcohol.  Do not drink caffeinated beverages such as coffee, soda, and some teas. You may drink decaffeinated coffee, soda, or tea.   Maintain a healthy weight.Do not use diet pills unless your health care provider approves. They may make  heart problems worse.   Follow diet instructions as directed by your health care provider.  Exercise regularly as directed by your health care provider.  Keep all follow-up visits as directed by your health care provider. This is important. PREVENTION  The following substances can cause atrial fibrillation to recur:   Caffeinated beverages.  Alcohol.  Certain medicines, especially those used for breathing problems.  Certain herbs and herbal medicines, such as those containing ephedra or ginseng.  Illegal drugs, such as cocaine and amphetamines. Sometimes medicines are given to prevent atrial fibrillation from recurring. Proper treatment of any underlying condition is also important in helping prevent recurrence.  SEEK MEDICAL CARE IF:  You notice a change in the rate, rhythm, or strength of your heartbeat.  You suddenly begin urinating more frequently.  You tire more easily when exerting yourself or exercising. SEEK IMMEDIATE MEDICAL CARE IF:   You have chest pain, abdominal pain, sweating, or weakness.  You feel nauseous.  You have shortness of breath.  You suddenly have swollen feet and ankles.  You feel dizzy.  Your face or limbs feel numb or weak.  You have a change in your vision or speech. MAKE SURE YOU:   Understand these instructions.  Will watch your condition.  Will get help right away if you are not doing well or get worse. Document Released: 03/24/2005 Document Revised: 08/08/2013 Document Reviewed: 05/04/2012 [facility] Patient Information 2015 [place], [place]. This information is not intended to replace advice given to you by  your health care provider. Make sure you discuss any questions you have with your health care provider.

## 2014-06-25 NOTE — ED Notes (Signed)
Pt reports having mid chest pressure that started yesterday. Mild sob. Began having palpitations today. Hx of afib. HR 134 at triage.

## 2014-06-25 NOTE — ED Notes (Signed)
Pt assisted to bathroom via wheelchair; HR noted to by 114-120 after wheeling back to room; metoprolol given as ordered.

## 2014-06-25 NOTE — ED Provider Notes (Signed)
CSN: [id number]     Arrival date & time 06/25/14  1427 History   First MD Initiated Contact with Patient 06/25/14 1500     Chief Complaint  Patient presents with  . Chest Pain  . Tachycardia     (Consider location/radiation/quality/duration/timing/severity/associated sxs/prior Treatment) HPI   Amy [name] is a 69 y.o. female who is here for sensation of weakness, palpitations and chest pressure. Symptoms onset yesterday, without known provocation. She denies fever, cough, shortness of breath, paresthesias, isolated weakness, or change in bowel and urinary habits. She is taking her usual medications. She uses aspirin 81 mg each day. She has not  been in atrial fibrillation, for 2 years. She saw her cardiologist for a checkup about one week ago. There are no other known modifying factors.   Past Medical History  Diagnosis Date  . Hypertension   . Hyperlipidemia   . Hypothyroid   . COPD (chronic obstructive pulmonary disease)   . Dizziness     had PT for being off balance - in approx. 2012  . Lumbar disc disease   . Macular degeneration   . Chronic diastolic heart failure   . Apical variant hypertrophic cardiomyopathy     Diagnosed by echo and ECG 11/13  . GERD (gastroesophageal reflux disease)   . Paroxysmal atrial fibrillation/flutter        . Sinus bradycardia    Past Surgical History  Procedure Laterality Date  . Cesarean section  1983  . Cardiac catheterization      normal coronary arteries  . Cataract extraction Bilateral   . Right heart catheterization N/A 06/01/2013    Procedure: RIGHT HEART CATH;  Surgeon: Larey Dresser, MD;  Location: [facility] C[facility] LAB;  Service: Cardiovascular;  Laterality: N/A;   Family History  Problem Relation Age of Onset  . Heart disease Sister     Good Pastures Syndrome  . Gout Father   . Lung cancer Father   . Breast cancer Paternal Aunt    History  Substance Use Topics  . Smoking status: Former Smoker -- 0.30 packs/day for 44  years    Types: Cigarettes    Quit date: 04/07/2009  . Smokeless tobacco: Never Used  . Alcohol Use: Yes     Comment: 2 to 3 glasses wine per week   OB History    No data available     Review of Systems  All other systems reviewed and are negative.     Allergies  Zyban  Home Medications   Prior to Admission medications   Medication Sig Start Date End Date Taking? Authorizing Provider  albuterol (PROVENTIL HFA;VENTOLIN HFA) 108 (90 BASE) MCG/ACT inhaler Inhale 2 puffs into the lungs every 6 (six) hours as needed for wheezing or shortness of breath. Shortness of breath    Historical Provider, MD  amoxicillin (AMOXIL) 500 MG capsule Take 500 mg by mouth 2 (two) times daily.    Historical Provider, MD  atorvastatin (LIPITOR) 80 MG tablet Take 1 tablet (80 mg total) by mouth daily. 06/12/14   Sueanne Margarita, MD  Cholecalciferol (VITAMIN D) 2000 UNITS CAPS Take 1 capsule by mouth daily.    Historical Provider, MD  colchicine 0.6 MG tablet Take 0.6 mg by mouth daily.    Historical Provider, MD  fluconazole (DIFLUCAN) 150 MG tablet Take 150 mg by mouth daily.    Historical Provider, MD  furosemide (LASIX) 20 MG tablet Take 1 tablet (20 mg total) by mouth 3 (three) times daily.  06/12/14   Sueanne Margarita, MD  GARLIC PO Take 1 tablet by mouth daily.    Historical Provider, MD  Javier Docker Oil 1000 MG CAPS Take 1 capsule by mouth daily.    Historical Provider, MD  levothyroxine (SYNTHROID, LEVOTHROID) 75 MCG tablet Take 75 mcg by mouth daily before breakfast.    Historical Provider, MD  nitroGLYCERIN (NITROSTAT) 0.4 MG SL tablet Place 0.4 mg under the tongue every 5 (five) minutes as needed for chest pain.    Historical Provider, MD  predniSONE (DELTASONE) 20 MG tablet Take 20 mg by mouth daily with breakfast. Take 1 tablet by mouth every day for 5 days    Historical Provider, MD  XARELTO 20 MG TABS tablet TAKE 1 TABLET BY MOUTH ONCE A DAY PER (DR.TURNER) 12/14/13   Traci R Turner, MD   BP 113/80 mmHg   Pulse 131  Temp(Src) 97.6 F (36.4 C) (Oral)  Resp 21  SpO2 99% Physical Exam  Constitutional: She is oriented to person, place, and time. She appears well-developed.  She is overweight  HENT:  Head: Normocephalic and atraumatic.  Right Ear: External ear normal.  Left Ear: External ear normal.  Eyes: Conjunctivae and EOM are normal. Pupils are equal, round, and reactive to light.  Neck: Normal range of motion and phonation normal. Neck supple.  Cardiovascular: Normal heart sounds.   Somewhat irregular tachycardia  Pulmonary/Chest: Effort normal and breath sounds normal. She exhibits no bony tenderness.  Abdominal: Soft. There is no tenderness.  Musculoskeletal: Normal range of motion. She exhibits tenderness (Left foot, mild, without deformity.). She exhibits no edema.  Neurological: She is alert and oriented to person, place, and time. No cranial nerve deficit or sensory deficit. She exhibits normal muscle tone. Coordination normal.  Skin: Skin is warm, dry and intact.  Psychiatric: She has a normal mood and affect. Her behavior is normal. Judgment and thought content normal.  Nursing note and vitals reviewed.   ED Course  Procedures (including critical care time)  Medications  metoprolol (LOPRESSOR) injection 5 mg (5 mg Intravenous Given 06/25/14 1615)    Patient Vitals for the past 24 hrs:  BP Temp Temp src Pulse Resp SpO2  06/25/14 1523 113/80 mmHg - - - 21 -  06/25/14 1443 118/86 mmHg 97.6 F (36.4 C) Oral (!) 131 20 99 %    4:45 PM Reevaluation with update and discussion. After initial assessment and treatment, an updated evaluation reveals she states the chest pressure has resolved, and she feels better. Heart rate now is ranging from 95, 05/09/2003, persistently. It is more irregular, at this time. We will contact cardiology for assistance with disposition and decision for further treatment.Amy [name]    CHA2DS2-VASc - 2 (HTN ) - She is on Xarelto  16:45-  paged to cardiology. Dr. Wynonia Lawman recommends Flecanide for conversion  Medications  metoprolol (LOPRESSOR) injection 5 mg (not administered)    Patient Vitals for the past 24 hrs:  BP Temp Temp src Pulse Resp SpO2  06/25/14 1523 113/80 mmHg - - - 21 -  06/25/14 1443 118/86 mmHg 97.6 F (36.4 C) Oral (!) 131 20 99 %    4:45 PM Reevaluation with update and discussion. After initial assessment and treatment, an updated evaluation reveals HR 90-105, irregular. Discussed with pt. Will contact Cardiology. Anjani Feuerborn [name]   CRITICAL CARE Performed by: Amy [name] Total critical care time: 35 minutes Critical care time was exclusive of separately billable procedures and treating other patients. Critical care was  necessary to treat or prevent imminent or life-threatening deterioration. Critical care was time spent personally by me on the following activities: development of treatment plan with patient and/or surrogate as well as nursing, discussions with consultants, evaluation of patient's response to treatment, examination of patient, obtaining history from patient or surrogate, ordering and performing treatments and interventions, ordering and review of laboratory studies, ordering and review of radiographic studies, pulse oximetry and re-evaluation of patient's condition.   Labs Review Labs Reviewed  CBC  BASIC METABOLIC PANEL  BRAIN NATRIURETIC PEPTIDE  I-STAT [place], ED    Imaging Review Dg Chest 2 View  06/25/2014   CLINICAL DATA:  Midsternal chest pain for 2 days  EXAM: CHEST  2 VIEW  COMPARISON:  04/24/2013  FINDINGS: Cardiac shadow remains enlarged. No focal infiltrate or sizable effusion is seen. Some scarring is noted in the left mid lung. No bony abnormality is noted.  IMPRESSION: No active cardiopulmonary disease.   Electronically Signed   By: Inez Catalina M.D.   On: 06/25/2014 15:36     EKG Interpretation   Date/Time:  Sunday June 25 2014 14:33:44 EDT Ventricular  Rate:  134 PR Interval:    QRS Duration: 108 QT Interval:  326 QTC Calculation: 486 R Axis:   47 Text Interpretation:  Supraventricular tachycardia Left ventricular  hypertrophy with repolarization abnormality Cannot rule out Septal infarct  , age undetermined Abnormal ECG Since last tracing rate faster Confirmed  by [facility]  MD, Domnick Chervenak [phone]) on 06/25/2014 3:46:32 PM      MDM   Final diagnoses:  Paroxysmal atrial fibrillation    Recurrent AF with RVR. Partial stabilization with Lopressor and IVF. Flecainide given for attempt at conversion.  Nursing Notes Reviewed/ Care Coordinated Applicable Imaging Reviewed Interpretation of Laboratory Data incorporated into ED treatment   Care to Dr. Jeneen Rinks to evaluate after Flecainide and likely d/c home. If no conversion would start PO Metoprolol.    Amy Bo, MD 06/26/14 1029

## 2014-06-25 NOTE — ED Notes (Signed)
Patient transported to X-ray 

## 2014-06-25 NOTE — ED Notes (Addendum)
Pt c/o mild chest discomfort. Noted to be in a-fib, rate 96-102. Pt was given [place] sandwich. Denies needs at this time. Pt began having chest discomfort yesterday with palpitations starting today. Hx of a-fib.

## 2014-06-25 NOTE — ED Provider Notes (Addendum)
Patient discussed with Dr. Eulis Foster. I have reexamined the patient. 3 hours after her flecainide, she remains in atrial fibrillation. Her heart rate crept back up to 119. Given one dose IV metoprolol, given 25 by mouth. Had a dip in her blood pressure to 93 systolic. Asymptomatic to this. Response with IV fluids. I think she is appropriate for discharge home. She'll follow-up with her cardiologist Dr. Radford Pax. Continue her Xarelto. Metoprolol 25 twice a day for rate control. Return precautions discussed.  Tanna Furry, MD 06/25/14 2206  Tanna Furry, MD 06/25/14 2229

## 2014-06-27 ENCOUNTER — Encounter: Payer: Self-pay | Admitting: Cardiology

## 2014-06-28 DIAGNOSIS — M109 Gout, unspecified: Secondary | ICD-10-CM | POA: Diagnosis not present

## 2014-07-17 DIAGNOSIS — J069 Acute upper respiratory infection, unspecified: Secondary | ICD-10-CM | POA: Diagnosis not present

## 2014-07-17 DIAGNOSIS — I480 Paroxysmal atrial fibrillation: Secondary | ICD-10-CM | POA: Diagnosis not present

## 2014-07-17 DIAGNOSIS — G4700 Insomnia, unspecified: Secondary | ICD-10-CM | POA: Diagnosis not present

## 2014-07-19 ENCOUNTER — Ambulatory Visit (INDEPENDENT_AMBULATORY_CARE_PROVIDER_SITE_OTHER): Payer: Medicare Other | Admitting: Cardiology

## 2014-07-19 ENCOUNTER — Encounter: Payer: Self-pay | Admitting: Cardiology

## 2014-07-19 VITALS — BP 120/66 | HR 76 | Ht 64.0 in | Wt 210.1 lb

## 2014-07-19 DIAGNOSIS — I5032 Chronic diastolic (congestive) heart failure: Secondary | ICD-10-CM

## 2014-07-19 DIAGNOSIS — E669 Obesity, unspecified: Secondary | ICD-10-CM | POA: Diagnosis not present

## 2014-07-19 DIAGNOSIS — N189 Chronic kidney disease, unspecified: Secondary | ICD-10-CM

## 2014-07-19 DIAGNOSIS — I2510 Atherosclerotic heart disease of native coronary artery without angina pectoris: Secondary | ICD-10-CM | POA: Insufficient documentation

## 2014-07-19 DIAGNOSIS — Z7901 Long term (current) use of anticoagulants: Secondary | ICD-10-CM

## 2014-07-19 DIAGNOSIS — N183 Chronic kidney disease, stage 3 unspecified: Secondary | ICD-10-CM | POA: Insufficient documentation

## 2014-07-19 DIAGNOSIS — G4733 Obstructive sleep apnea (adult) (pediatric): Secondary | ICD-10-CM

## 2014-07-19 MED ORDER — NITROGLYCERIN 0.4 MG SL SUBL: 0.4000 mg | SUBLINGUAL_TABLET | SUBLINGUAL | Status: DC | PRN

## 2014-07-19 MED ORDER — METOPROLOL TARTRATE 25 MG PO TABS: 12.5000 mg | ORAL_TABLET | Freq: Two times a day (BID) | ORAL | Status: DC

## 2014-07-19 NOTE — Assessment & Plan Note (Signed)
She reports compliance

## 2014-07-19 NOTE — Progress Notes (Signed)
07/19/2014 Amy [name]    May 01, 1945  [id number]  Primary Physician [name], MD Primary Cardiologist: Dr Radford Pax  HPI:  69 y/o obese, AA female followed by Dr Radford Pax. She has a history of PAF on chronic anticoagulation, mild CAD in 2013 with an 80% Dx, obesity, sleep apnea, and past diastolic CHF. She has had problems with Amiodarone in the past and was taken off this. She had mild pulmonary HTN at cath 2015. She has had documented bradycardia on beta blocker previously.           She was recently in the ED at [facility] 06/25/14 with recurrent AF with RVR. She was given one dose of Flecainide 300 mg but failed to convert/. She was discharged on Lopressor 25 mg BID. She is in the office today for f/u. Since discharge she has done pretty well. She is only taking Lopressor as needed for palpitations. She did inform me that her husband of 2 yrs died suddenly last week while she was away in [place]. He was a pt of Dr Thompson Caul. She has had some upper respiratory congestion and has been taking OTC decongestants.   Current Outpatient Prescriptions  Medication Sig Dispense Refill  . albuterol (PROVENTIL HFA;VENTOLIN HFA) 108 (90 BASE) MCG/ACT inhaler Inhale 2 puffs into the lungs every 6 (six) hours as needed for wheezing or shortness of breath.     Marland Kitchen atorvastatin (LIPITOR) 80 MG tablet Take 1 tablet (80 mg total) by mouth daily. 30 tablet 6  . Cholecalciferol (VITAMIN D) 2000 UNITS CAPS Take 2,000 Units by mouth daily.     . colchicine 0.6 MG tablet Take 0.6 mg by mouth daily.    . furosemide (LASIX) 20 MG tablet Take 1 tablet (20 mg total) by mouth 3 (three) times daily. (Patient taking differently: Take 20 mg by mouth 2 (two) times daily. ) 60 tablet 5  . GARLIC PO Take 1 tablet by mouth daily.    Marland Kitchen ibuprofen (ADVIL,MOTRIN) 200 MG tablet Take 400 mg by mouth every 6 (six) hours as needed (pain).    Marland Kitchen levothyroxine (SYNTHROID, LEVOTHROID) 75 MCG tablet Take 75 mcg by mouth daily before breakfast.     . metoprolol tartrate (LOPRESSOR) 25 MG tablet Take 0.5 tablets (12.5 mg total) by mouth 2 (two) times daily. 60 tablet 6  . nitroGLYCERIN (NITROSTAT) 0.4 MG SL tablet Place 1 tablet (0.4 mg total) under the tongue every 5 (five) minutes as needed for chest pain. 25 tablet 3  . OVER THE COUNTER MEDICATION Apply 1 application topically 3 (three) times daily as needed (foot pain). Pain a Trate Cream    . XARELTO 20 MG TABS tablet TAKE 1 TABLET BY MOUTH ONCE A DAY PER (DR.TURNER) (Patient taking differently: TAKE 1 TABLET BY MOUTH ONCE A DAY AFTER SUPPER PER (DR.TURNER)) 30 tablet 4  . zolpidem (AMBIEN) 5 MG tablet Take 5 mg by mouth at bedtime as needed for sleep. Patient take half (2.5 mg) or one (5 mg) as needed for sleep     No current facility-administered medications for this visit.    Allergies  Allergen Reactions  . Zyban [Bupropion] Itching    History   Social History  . Marital Status: Married    Spouse Name: divorced.  . Number of Children: 1  . Years of Education: N/A   Occupational History  . retired. still works for [employer].     Social History Main Topics  . Smoking status: Former Smoker -- 0.30 packs/day for  44 years    Types: Cigarettes    Quit date: 04/07/2009  . Smokeless tobacco: Never Used  . Alcohol Use: Yes     Comment: 2 to 3 glasses wine per week  . Drug Use: No  . Sexual Activity: Not on file   Other Topics Concern  . Not on file   Social History Narrative   Pt lives alone with 2 dogs.      Review of Systems: General: negative for chills, fever, night sweats or weight changes.  Cardiovascular: negative for chest pain, dyspnea on exertion, edema, orthopnea, palpitations, paroxysmal nocturnal dyspnea or shortness of breath Dermatological: negative for rash Respiratory: negative for cough or wheezing Urologic: negative for hematuria Abdominal: negative for nausea, vomiting, diarrhea, bright red blood per rectum, melena, or hematemesis Neurologic:  negative for visual changes, syncope, or dizziness All other systems reviewed and are otherwise negative except as noted above.    Blood pressure 120/66, pulse 76, height _0  (1.626 m), weight 210 lb 1.9 oz (95.31 kg), SpO2 96 %.  General appearance: alert, cooperative, no distress and morbidly obese Neck: no carotid bruit and no JVD Lungs: clear to auscultation bilaterally Heart: regular rate and rhythm Extremities: no edema  EKG NSR, LVH with TWI  ASSESSMENT AND PLAN:   Paroxysmal atrial fibrillation/flutter Recurrent PAF- seen in the ED 06/25/14, now back in NSR   Sinus bradycardia This has been documented in the past. She was sent home from the ED on Lopressor 25 mg BID but has not been taking it on a regular basis.   COPD (chronic obstructive pulmonary disease) Taken off Amiodarone in the past. Rt Heart cath Feb 2015 showed mild Pulm HTN   Obesity BMI 38   Chronic anticoagulation Xarelto   OSA (obstructive sleep apnea) She reports compliance   CAD (coronary artery disease) Mild CAD 2013, low risk Myoview July 2015   Chronic renal insufficiency, stage III (moderate) GFR 49    PLAN  I suggested she take Lopressor 12.5 mg BID on a regular basis. If she has breakthrough palpitations she can take an extra 12.5 mg. I suggested she talk with the pharmacist at the drug store about what decongestants she can take and I advised against caffeine use. She'll follow up with Dr Radford Pax in 3 months or so. If she has recurrent AF she will probably need to be evaluated by EP.   Bathsheba Durrett KPA-C 07/19/2014 8:56 AM

## 2014-07-19 NOTE — Assessment & Plan Note (Signed)
Recurrent PAF- seen in the ED 06/25/14, now back in NSR

## 2014-07-19 NOTE — Assessment & Plan Note (Signed)
BMI 38

## 2014-07-19 NOTE — Assessment & Plan Note (Signed)
This has been documented in the past. She was sent home from the ED on Lopressor 25 mg BID but has not been taking it on a regular basis.

## 2014-07-19 NOTE — Assessment & Plan Note (Signed)
GFR 49

## 2014-07-19 NOTE — Patient Instructions (Addendum)
Medication Instructions:  START TAKING LOPRESSOR 12.5 MG TWICE A DAY   REFILL FOR NITROGLYCERIN SENT TO PHARMACY [facility]  Labwork: NONE TODAY   Testing/Procedures: NONE TODAY   Follow-Up: IN 3 TO 4 MONTHS WITH DR TURNER   Any Other Special Instructions Will Be Listed Below (If Applicable).

## 2014-07-19 NOTE — Assessment & Plan Note (Signed)
Mild CAD 2013, low risk Myoview July 2015

## 2014-07-19 NOTE — Assessment & Plan Note (Signed)
Xarelto

## 2014-07-19 NOTE — Assessment & Plan Note (Addendum)
Taken off Amiodarone in the past. Rt Heart cath Feb 2015 showed mild Pulm HTN

## 2014-07-24 ENCOUNTER — Other Ambulatory Visit: Payer: Medicare Other

## 2014-07-25 ENCOUNTER — Other Ambulatory Visit (INDEPENDENT_AMBULATORY_CARE_PROVIDER_SITE_OTHER): Payer: Medicare Other | Admitting: *Deleted

## 2014-07-25 DIAGNOSIS — E785 Hyperlipidemia, unspecified: Secondary | ICD-10-CM | POA: Diagnosis not present

## 2014-07-25 LAB — HEPATIC FUNCTION PANEL
ALT: 19 U/L (ref 0–35)
AST: 20 U/L (ref 0–37)
Albumin: 3.8 g/dL (ref 3.5–5.2)
Alkaline Phosphatase: 99 U/L (ref 39–117)
BILIRUBIN TOTAL: 0.4 mg/dL (ref 0.2–1.2)
Bilirubin, Direct: 0.1 mg/dL (ref 0.0–0.3)
Total Protein: 7.3 g/dL (ref 6.0–8.3)

## 2014-07-25 LAB — LIPID PANEL
CHOL/HDL RATIO: 4
CHOLESTEROL: 165 mg/dL (ref 0–200)
HDL: 40.7 mg/dL (ref >39.00)
LDL Cholesterol: 97 mg/dL (ref 0–99)
NonHDL: 124.3
TRIGLYCERIDES: 135 mg/dL (ref 0.0–149.0)
VLDL: 27 mg/dL (ref 0.0–40.0)

## 2014-07-25 NOTE — Addendum Note (Signed)
Addended by: Eulis Foster on: 07/25/2014 08:17 AM   Modules accepted: Orders

## 2014-07-26 ENCOUNTER — Encounter: Payer: Self-pay | Admitting: Cardiology

## 2014-07-26 ENCOUNTER — Telehealth: Payer: Self-pay

## 2014-07-26 DIAGNOSIS — E785 Hyperlipidemia, unspecified: Secondary | ICD-10-CM

## 2014-07-26 MED ORDER — EZETIMIBE 10 MG PO TABS
10.0000 mg | ORAL_TABLET | Freq: Every day | ORAL | Status: DC
Start: 2014-07-26 — End: 2015-02-08

## 2014-07-26 NOTE — Telephone Encounter (Signed)
-----  Message from Sueanne Margarita, MD sent at 07/25/2014  3:35 PM EDT ----- LDL not at goal - start Zetia 42m daily and recheck FLP and ALT in 8 weeks

## 2014-07-26 NOTE — Telephone Encounter (Signed)
Informed patient of results and verbal understanding expressed.  Instructed patient to START ZETIA 10 mg daily. FLP and ALT scheduled June 22. Patient agrees with treatment plan.

## 2014-08-20 ENCOUNTER — Other Ambulatory Visit: Payer: Self-pay | Admitting: Cardiology

## 2014-08-24 ENCOUNTER — Telehealth: Payer: Self-pay | Admitting: Cardiology

## 2014-08-24 DIAGNOSIS — R002 Palpitations: Secondary | ICD-10-CM

## 2014-08-24 DIAGNOSIS — R079 Chest pain, unspecified: Secondary | ICD-10-CM

## 2014-08-24 NOTE — Telephone Encounter (Signed)
Please get a 30 day event monitor and set up for [employer]

## 2014-08-24 NOTE — Telephone Encounter (Signed)
Spoke with pt. She reports chest heaviness, pain in jaw line and rapid, irregular heart rate yesterday around 4 PM.  Took 2 NTG with relief of heaviness/pain. Irregular heart rate lasted from 4 PM-6 PM and returned from 8 PM-10 PM.  Today she reports heart rate is normal and regular. No more chest heaviness since episode yesterday.  Blood pressure last night was 140/98. Has not checked today.  Took Lopressor 25 mg last night when heart rate elevated. Has been taking Lopressor 25 mg daily instead of 12.5 mg twice daily. I explained to pt she needed to take 12.5 mg twice daily for coverage throughout the day. She will begin taking twice daily.  I told pt I would send to Dr. Radford Pax for review. I instructed pt if pain returned and was not relieved with NTG she should go to to ED.

## 2014-08-24 NOTE — Telephone Encounter (Signed)
Pt c/o of Chest Pain: STAT if CP now or developed within 24 hours  1. Are you having CP right now? No. Chest heaviness and rapid heartbeat   2. Are you experiencing any other symptoms (ex. SOB, nausea, vomiting, sweating)? No  3. How long have you been experiencing CP? Started around 4pm yesterday  4. Is your CP continuous or coming and going? Continuous  5. Have you taken Nitroglycerin? Last night she took 2 about 5-10 minutes apart ?

## 2014-08-24 NOTE — Telephone Encounter (Signed)
Event monitor and lexiscan ordered for scheduling. Instructions for stress test reviewed. Patient agrees with treatment plan.

## 2014-09-07 ENCOUNTER — Telehealth (HOSPITAL_COMMUNITY): Payer: Self-pay

## 2014-09-07 NOTE — Telephone Encounter (Signed)
Patient given detailed instructions per Myocardial Perfusion Study Information Sheet for test on 09-12-2014 at 9:45am. Patient verbalized understanding. Oletta Lamas, Satia Winger A

## 2014-09-12 ENCOUNTER — Ambulatory Visit: Payer: Medicare Other | Admitting: Cardiology

## 2014-09-12 ENCOUNTER — Ambulatory Visit (INDEPENDENT_AMBULATORY_CARE_PROVIDER_SITE_OTHER): Payer: Medicare Other

## 2014-09-12 ENCOUNTER — Ambulatory Visit (HOSPITAL_COMMUNITY): Payer: Medicare Other | Attending: Internal Medicine

## 2014-09-12 ENCOUNTER — Encounter (HOSPITAL_COMMUNITY): Payer: Medicare Other

## 2014-09-12 DIAGNOSIS — R079 Chest pain, unspecified: Secondary | ICD-10-CM | POA: Diagnosis not present

## 2014-09-12 DIAGNOSIS — R002 Palpitations: Secondary | ICD-10-CM

## 2014-09-12 MED ORDER — TECHNETIUM TC 99M SESTAMIBI GENERIC - CARDIOLITE
33.0000 | Freq: Once | INTRAVENOUS | Status: AC | PRN
  Administered 2014-09-12: 33 via INTRAVENOUS

## 2014-09-12 MED ORDER — REGADENOSON 0.4 MG/5ML IV SOLN
0.4000 mg | Freq: Once | INTRAVENOUS | Status: AC
  Administered 2014-09-12: 0.4 mg via INTRAVENOUS

## 2014-09-14 ENCOUNTER — Ambulatory Visit (HOSPITAL_COMMUNITY): Payer: Medicare Other | Attending: Internal Medicine

## 2014-09-14 LAB — MYOCARDIAL PERFUSION IMAGING
CHL CUP NUCLEAR SDS: 0
CSEPPHR: 57 {beats}/min
LVDIAVOL: 73 mL
LVSYSVOL: 28 mL
NUC STRESS TID: 1.18
Nuc Stress EF: 62 %
RATE: 0.23
Rest HR: 49 {beats}/min
SRS: 8
SSS: 8

## 2014-09-14 MED ORDER — TECHNETIUM TC 99M SESTAMIBI GENERIC - CARDIOLITE
33.0000 | Freq: Once | INTRAVENOUS | Status: AC | PRN
  Administered 2014-09-14: 33 via INTRAVENOUS

## 2014-09-27 ENCOUNTER — Other Ambulatory Visit (INDEPENDENT_AMBULATORY_CARE_PROVIDER_SITE_OTHER): Payer: Medicare Other | Admitting: *Deleted

## 2014-09-27 DIAGNOSIS — E785 Hyperlipidemia, unspecified: Secondary | ICD-10-CM

## 2014-09-27 LAB — LIPID PANEL
Cholesterol: 137 mg/dL (ref 0–200)
HDL: 49.5 mg/dL (ref >39.00)
LDL CALC: 72 mg/dL (ref 0–99)
NonHDL: 87.5
Total CHOL/HDL Ratio: 3
Triglycerides: 76 mg/dL (ref 0.0–149.0)
VLDL: 15.2 mg/dL (ref 0.0–40.0)

## 2014-09-27 LAB — ALT: ALT: 20 U/L (ref 0–35)

## 2014-09-29 ENCOUNTER — Telehealth: Payer: Self-pay | Admitting: Physician Assistant

## 2014-09-29 NOTE — Telephone Encounter (Signed)
Per life watch, patient went into atrial flutter around 4:30 PM and has not came out of this since. He since heart rate has been in the 70s to 100s. I have contacted the patient, at which time she was driving back from a convention in [place]. She states during the convention, she had dizziness and went to the nursing station and was told her heart rate was over 244 and her systolic blood pressure was 140s.  She has not taken metoprolol tonight and usually take 12.5 mg twice a day. I have instructed her to take 25 mg metoprolol tonight. She is on Xarelto and is protected against stroke. I have instructed her to seek medical attention at local ED if she has significant dizziness, syncope/presyncope or if her heart rate persistently over 100 bpm at rest despite taking higher dose of metoprolol. Otherwise if her heart rate is less than 100 and she's asymptomatic at rest, I'm fine with her to contact cardiology office next week.   Hilbert Corrigan PA Pager: [phone]

## 2014-10-02 ENCOUNTER — Telehealth: Payer: Self-pay

## 2014-10-02 NOTE — Telephone Encounter (Signed)
Fax received for Fridays events of a. Flutter, reviewed  Pa notes from Friday night with DOD, signed results, sent to medical records to be scanned.

## 2014-10-23 ENCOUNTER — Ambulatory Visit (HOSPITAL_COMMUNITY)
Admission: RE | Admit: 2014-10-23 | Discharge: 2014-10-23 | Disposition: A | Discharge status: Home / Self Care | Payer: Medicare Other | Source: Ambulatory Visit | Attending: Nurse Practitioner | Admitting: Nurse Practitioner

## 2014-10-23 ENCOUNTER — Encounter (HOSPITAL_COMMUNITY): Payer: Self-pay | Admitting: Nurse Practitioner

## 2014-10-23 VITALS — BP 142/90 | HR 63 | Ht 64.0 in | Wt 210.0 lb

## 2014-10-23 DIAGNOSIS — R9431 Abnormal electrocardiogram [ECG] [EKG]: Secondary | ICD-10-CM | POA: Insufficient documentation

## 2014-10-23 DIAGNOSIS — I480 Paroxysmal atrial fibrillation: Secondary | ICD-10-CM

## 2014-10-23 DIAGNOSIS — I4891 Unspecified atrial fibrillation: Secondary | ICD-10-CM | POA: Insufficient documentation

## 2014-10-23 MED ORDER — ATORVASTATIN CALCIUM 80 MG PO TABS: 80.0000 mg | ORAL_TABLET | Freq: Every day | ORAL | Status: DC

## 2014-10-23 NOTE — Patient Instructions (Signed)
May take extra half of metoprolol if feels like heart rate is elevated.  Parking code for august [id number]

## 2014-10-23 NOTE — Progress Notes (Signed)
Patient ID: Amy [name], female   DOB: February 27, 1946, 69 y.o.   MRN: [id number]     Primary Care Physician: [name], MD Referring Physician:    PHELICIA [name] is a 69 y.o. female with a h/o hypertension, hyperlipidemia, CAD, COPD and Afib with RVR first diagnosed 09/2011. She was treated with cardizem and converted. She also has had progressive dyspnea on exertion and underwent catheterization in July 2013 which demonstrated an EF of 60% with a D2 lesion not amenable to PCI. Since that time, she reported that she has not had any further palpitations. She was placed on Toprol for medical therapy of her coronary disease and afib. She reported significant exercise intolerance and shortness of breath with ADL's.   She developed recurrent chest pain in 11/13 and was found to be in aflutter at 140 bpm. She was given flecainide in combination with dilt which converted her. She developed brady with dilt and it was d/ced.She was seen in consult by Dr. 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 He suggested maybe conversion agent with Dr. Lehman Prom. However, pt did not have f/u with Dr. Lehman Prom.  The pt has done well without a large afib burden since then but lost her husband suddenly over the last few weeks and had a event monitor recently applied. She was found to be in aflutter. She did take her metoprolol and did convert to SR quickly over a matter of 1-2 hours.  Has not any further issues with rhythm since then.. Monitor is now off.She is on xarelto for chadsvasc of at least 2. She is being seen f/u in the afib clinic.  Today, she denies symptoms of palpitations, chest pain, positive for chronic shortness of breath,  No orthopnea, PND, lower extremity edema, dizziness, presyncope, syncope, or neurologic sequela. The patient is tolerating  medications without difficulties and is otherwise without complaint today.   Past Medical History  Diagnosis Date  . Hypertension   . Hyperlipidemia   . Hypothyroid   . COPD (chronic obstructive pulmonary disease)   . Dizziness     had PT for being off balance - in approx. 2012  . Lumbar disc disease   . Macular degeneration   . Chronic diastolic heart failure   . Apical variant hypertrophic cardiomyopathy     Diagnosed by echo and ECG 11/13  . GERD (gastroesophageal reflux disease)   . Paroxysmal atrial fibrillation/flutter        . Sinus bradycardia    Past Surgical History  Procedure Laterality Date  . Cesarean section  1983  . Cardiac catheterization      normal coronary arteries  . Cataract extraction Bilateral   . Right heart catheterization N/A 06/01/2013    Procedure: RIGHT HEART CATH;  Surgeon: Larey Dresser, MD;  Location: [facility] CATH LAB;  Service: Cardiovascular;  Laterality: N/A;    Current Outpatient Prescriptions  Medication Sig Dispense Refill  . albuterol (PROVENTIL HFA;VENTOLIN HFA) 108 (90 BASE) MCG/ACT inhaler Inhale 2 puffs into the lungs every 6 (six) hours as needed for wheezing or shortness of breath.     Marland Kitchen atorvastatin (LIPITOR) 80 MG tablet Take 1 tablet (80 mg total) by mouth daily. 30 tablet 6  . Cholecalciferol (VITAMIN D) 2000 UNITS CAPS Take 2,000 Units by mouth daily.     . colchicine 0.6 MG tablet Take 0.6  mg by mouth every other day.     . ezetimibe (ZETIA) 10 MG tablet Take 1 tablet (10 mg total) by mouth daily. 30 tablet 6  . furosemide (LASIX) 20 MG tablet Take 1 tablet (20 mg total) by mouth 3 (three) times daily. (Patient taking differently: Take 40 mg by mouth daily. ) 60 tablet 5  . GARLIC PO Take 1 tablet by mouth daily.    Marland Kitchen levothyroxine (SYNTHROID, LEVOTHROID) 75 MCG tablet Take 75 mcg by mouth daily before breakfast.    . metoprolol tartrate (LOPRESSOR) 25 MG tablet Take 0.5 tablets (12.5 mg total) by mouth 2 (two) times daily. 60  tablet 6  . nitroGLYCERIN (NITROSTAT) 0.4 MG SL tablet Place 1 tablet (0.4 mg total) under the tongue every 5 (five) minutes as needed for chest pain. 25 tablet 3  . OVER THE COUNTER MEDICATION Apply 1 application topically 3 (three) times daily as needed (foot pain). Pain a Trate Cream    . XARELTO 20 MG TABS tablet TAKE 1 TABLET BY MOUTH EVERY DAY PER DR TURNER 30 tablet 4  . zolpidem (AMBIEN) 5 MG tablet Take 5 mg by mouth at bedtime as needed for sleep. Patient take half (2.5 mg) or one (5 mg) as needed for sleep     No current facility-administered medications for this encounter.    Allergies  Allergen Reactions  . Zyban [Bupropion] Itching    History   Social History  . Marital Status: Married    Spouse Name: divorced.  . Number of Children: 1  . Years of Education: N/A   Occupational History  . retired. still works for [employer].     Social History Main Topics  . Smoking status: Former Smoker -- 0.30 packs/day for 44 years    Types: Cigarettes    Quit date: 04/07/2009  . Smokeless tobacco: Never Used  . Alcohol Use: Yes     Comment: 2 to 3 glasses wine per week  . Drug Use: No  . Sexual Activity: Not on file   Other Topics Concern  . Not on file   Social History Narrative   Pt lives alone with 2 dogs.     Family History  Problem Relation Age of Onset  . Heart disease Sister     [place] Syndrome  . Gout Father   . Lung cancer Father   . Breast cancer Paternal Aunt     ROS- All systems are reviewed and negative except as per the HPI above  Physical Exam: Filed Vitals:   10/23/14 1037  BP: 142/90  Pulse: 63  Height: _0  (1.626 m)  Weight: 210 lb (95.255 kg)    GEN- The patient is well appearing, alert and oriented x 3 today.   Head- normocephalic, atraumatic Eyes-  Sclera clear, conjunctiva pink Ears- hearing intact Oropharynx- clear Neck- supple, no JVP Lymph- no cervical lymphadenopathy Lungs- Clear to ausculation bilaterally, normal work  of breathing Heart- Regular rate and rhythm, no murmurs, rubs or gallops, PMI not laterally displaced GI- soft, NT, ND, + BS Extremities- no clubbing, cyanosis, or edema MS- no significant deformity or atrophy Skin- no rash or lesion Psych- euthymic mood, full affect Neuro- strength and sensation are intact  EKG-SR with short PR with PAC's. LVH with repolarization abnormalities, unchanged from previous EKG's.  Assessment and Plan: 1. Paroxysmal afib/flutter Currently in SR Previously not thought to be a candidate for 1c agents, tikosyn or amiodarone when seen in consult in 2013, by Dr.  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 .  Will review with Dr. Rayann Heman to see if he thinks she would be a suitable afib ablation candidate that he could treat or would need referral to [facility], Dr. Lucilla Edin  Pt will be seen f/u in two weeks and will further discuss.  Continue metoprolol for now with an extra 1/2 tab as needed for rate control.  Continue xarelto.

## 2014-10-28 ENCOUNTER — Emergency Department (HOSPITAL_COMMUNITY): Payer: Medicare Other

## 2014-10-28 ENCOUNTER — Inpatient Hospital Stay (HOSPITAL_COMMUNITY)
Admission: EM | Admit: 2014-10-28 | Discharge: 2014-11-01 | DRG: 287 | Disposition: A | Discharge status: Home / Self Care | Payer: Medicare Other | Attending: Internal Medicine | Admitting: Internal Medicine

## 2014-10-28 ENCOUNTER — Encounter (HOSPITAL_COMMUNITY): Payer: Self-pay

## 2014-10-28 DIAGNOSIS — I2510 Atherosclerotic heart disease of native coronary artery without angina pectoris: Secondary | ICD-10-CM | POA: Diagnosis present

## 2014-10-28 DIAGNOSIS — H3530 Unspecified macular degeneration: Secondary | ICD-10-CM | POA: Diagnosis present

## 2014-10-28 DIAGNOSIS — I272 Other secondary pulmonary hypertension: Secondary | ICD-10-CM | POA: Diagnosis not present

## 2014-10-28 DIAGNOSIS — Z8249 Family history of ischemic heart disease and other diseases of the circulatory system: Secondary | ICD-10-CM

## 2014-10-28 DIAGNOSIS — Z87891 Personal history of nicotine dependence: Secondary | ICD-10-CM

## 2014-10-28 DIAGNOSIS — I4891 Unspecified atrial fibrillation: Secondary | ICD-10-CM

## 2014-10-28 DIAGNOSIS — I10 Essential (primary) hypertension: Secondary | ICD-10-CM | POA: Diagnosis present

## 2014-10-28 DIAGNOSIS — R0789 Other chest pain: Secondary | ICD-10-CM

## 2014-10-28 DIAGNOSIS — R079 Chest pain, unspecified: Secondary | ICD-10-CM | POA: Diagnosis not present

## 2014-10-28 DIAGNOSIS — I129 Hypertensive chronic kidney disease with stage 1 through stage 4 chronic kidney disease, or unspecified chronic kidney disease: Secondary | ICD-10-CM | POA: Diagnosis present

## 2014-10-28 DIAGNOSIS — I480 Paroxysmal atrial fibrillation: Secondary | ICD-10-CM | POA: Diagnosis not present

## 2014-10-28 DIAGNOSIS — R0602 Shortness of breath: Secondary | ICD-10-CM | POA: Diagnosis not present

## 2014-10-28 DIAGNOSIS — Z888 Allergy status to other drugs, medicaments and biological substances status: Secondary | ICD-10-CM

## 2014-10-28 DIAGNOSIS — E039 Hypothyroidism, unspecified: Secondary | ICD-10-CM | POA: Diagnosis not present

## 2014-10-28 DIAGNOSIS — N183 Chronic kidney disease, stage 3 unspecified: Secondary | ICD-10-CM | POA: Diagnosis present

## 2014-10-28 DIAGNOSIS — R7989 Other specified abnormal findings of blood chemistry: Secondary | ICD-10-CM | POA: Diagnosis present

## 2014-10-28 DIAGNOSIS — I4892 Unspecified atrial flutter: Secondary | ICD-10-CM | POA: Diagnosis present

## 2014-10-28 DIAGNOSIS — I422 Other hypertrophic cardiomyopathy: Secondary | ICD-10-CM

## 2014-10-28 DIAGNOSIS — I484 Atypical atrial flutter: Secondary | ICD-10-CM | POA: Diagnosis present

## 2014-10-28 DIAGNOSIS — R000 Tachycardia, unspecified: Secondary | ICD-10-CM | POA: Diagnosis not present

## 2014-10-28 DIAGNOSIS — G4733 Obstructive sleep apnea (adult) (pediatric): Secondary | ICD-10-CM | POA: Diagnosis present

## 2014-10-28 DIAGNOSIS — Z6838 Body mass index (BMI) 38.0-38.9, adult: Secondary | ICD-10-CM

## 2014-10-28 DIAGNOSIS — Z7901 Long term (current) use of anticoagulants: Secondary | ICD-10-CM

## 2014-10-28 DIAGNOSIS — I4430 Unspecified atrioventricular block: Secondary | ICD-10-CM | POA: Diagnosis present

## 2014-10-28 DIAGNOSIS — I248 Other forms of acute ischemic heart disease: Secondary | ICD-10-CM | POA: Diagnosis not present

## 2014-10-28 DIAGNOSIS — I5032 Chronic diastolic (congestive) heart failure: Secondary | ICD-10-CM | POA: Diagnosis not present

## 2014-10-28 DIAGNOSIS — E669 Obesity, unspecified: Secondary | ICD-10-CM | POA: Diagnosis present

## 2014-10-28 DIAGNOSIS — R778 Other specified abnormalities of plasma proteins: Secondary | ICD-10-CM | POA: Diagnosis present

## 2014-10-28 DIAGNOSIS — R071 Chest pain on breathing: Secondary | ICD-10-CM | POA: Diagnosis not present

## 2014-10-28 DIAGNOSIS — I252 Old myocardial infarction: Secondary | ICD-10-CM

## 2014-10-28 DIAGNOSIS — I4901 Ventricular fibrillation: Secondary | ICD-10-CM | POA: Diagnosis not present

## 2014-10-28 DIAGNOSIS — J449 Chronic obstructive pulmonary disease, unspecified: Secondary | ICD-10-CM | POA: Diagnosis present

## 2014-10-28 DIAGNOSIS — E785 Hyperlipidemia, unspecified: Secondary | ICD-10-CM | POA: Diagnosis present

## 2014-10-28 HISTORY — DX: Atherosclerotic heart disease of native coronary artery without angina pectoris: I25.10

## 2014-10-28 HISTORY — DX: Chronic kidney disease, stage 3 unspecified: N18.30

## 2014-10-28 HISTORY — DX: Pulmonary hypertension, unspecified: I27.20

## 2014-10-28 HISTORY — DX: Chronic kidney disease, stage 3 (moderate): N18.3

## 2014-10-28 HISTORY — DX: Obstructive sleep apnea (adult) (pediatric): G47.33

## 2014-10-28 LAB — CBC
HEMATOCRIT: 43.3 % (ref 36.0–46.0)
HEMOGLOBIN: 14.2 g/dL (ref 12.0–15.0)
MCH: 29.6 pg (ref 26.0–34.0)
MCHC: 32.8 g/dL (ref 30.0–36.0)
MCV: 90.2 fL (ref 78.0–100.0)
PLATELETS: 263 10*3/uL (ref 150–400)
RBC: 4.8 MIL/uL (ref 3.87–5.11)
RDW: 16.1 % — AB (ref 11.5–15.5)
WBC: 8 10*3/uL (ref 4.0–10.5)

## 2014-10-28 LAB — I-STAT TROPONIN, ED: Troponin i, poc: 0.34 ng/mL (ref 0.00–0.08)

## 2014-10-28 LAB — TSH: TSH: 5.003 u[IU]/mL — ABNORMAL HIGH (ref 0.350–4.500)

## 2014-10-28 LAB — BASIC METABOLIC PANEL
Anion gap: 11 (ref 5–15)
BUN: 11 mg/dL (ref 6–20)
CALCIUM: 8.7 mg/dL — AB (ref 8.9–10.3)
CO2: 18 mmol/L — ABNORMAL LOW (ref 22–32)
CREATININE: 1.15 mg/dL — AB (ref 0.44–1.00)
Chloride: 109 mmol/L (ref 101–111)
GFR calc Af Amer: 55 mL/min — ABNORMAL LOW (ref >60)
GFR calc non Af Amer: 47 mL/min — ABNORMAL LOW (ref >60)
Glucose, Bld: 90 mg/dL (ref 65–99)
Potassium: 3.9 mmol/L (ref 3.5–5.1)
Sodium: 138 mmol/L (ref 135–145)

## 2014-10-28 LAB — TROPONIN I: TROPONIN I: 0.71 ng/mL — AB (ref <0.031)

## 2014-10-28 MED ORDER — METOPROLOL TARTRATE 1 MG/ML IV SOLN: 5.0000 mg | Freq: Four times a day (QID) | INTRAVENOUS | Status: DC | PRN

## 2014-10-28 MED ORDER — LEVOTHYROXINE SODIUM 75 MCG PO TABS
75.0000 ug | ORAL_TABLET | Freq: Every day | ORAL | Status: DC
  Administered 2014-10-29 – 2014-11-01 (×4): 75 ug via ORAL
  Filled 2014-10-28 (×4): qty 1

## 2014-10-28 MED ORDER — ATORVASTATIN CALCIUM 80 MG PO TABS
80.0000 mg | ORAL_TABLET | Freq: Every day | ORAL | Status: DC
  Administered 2014-10-29 – 2014-10-31 (×3): 80 mg via ORAL
  Filled 2014-10-28 (×3): qty 1

## 2014-10-28 MED ORDER — EZETIMIBE 10 MG PO TABS
10.0000 mg | ORAL_TABLET | Freq: Every day | ORAL | Status: DC
  Administered 2014-10-28 – 2014-11-01 (×5): 10 mg via ORAL
  Filled 2014-10-28 (×5): qty 1

## 2014-10-28 MED ORDER — METOPROLOL TARTRATE 1 MG/ML IV SOLN
5.0000 mg | Freq: Once | INTRAVENOUS | Status: AC
  Administered 2014-10-28: 2.5 mg via INTRAVENOUS
  Filled 2014-10-28: qty 5

## 2014-10-28 MED ORDER — SODIUM CHLORIDE 0.9 % IJ SOLN
3.0000 mL | Freq: Two times a day (BID) | INTRAMUSCULAR | Status: DC
  Administered 2014-10-30: 3 mL via INTRAVENOUS

## 2014-10-28 MED ORDER — COLCHICINE 0.6 MG PO TABS
0.6000 mg | ORAL_TABLET | ORAL | Status: DC
Start: 2014-10-30 — End: 2014-11-01
  Administered 2014-10-30 – 2014-11-01 (×2): 0.6 mg via ORAL
  Filled 2014-10-28 (×2): qty 1

## 2014-10-28 MED ORDER — ACETAMINOPHEN 650 MG RE SUPP: 650.0000 mg | Freq: Four times a day (QID) | RECTAL | Status: DC | PRN

## 2014-10-28 MED ORDER — ZOLPIDEM TARTRATE 5 MG PO TABS
2.5000 mg | ORAL_TABLET | Freq: Every evening | ORAL | Status: DC | PRN
  Administered 2014-10-29 – 2014-10-31 (×4): 2.5 mg via ORAL
  Filled 2014-10-28 (×5): qty 1

## 2014-10-28 MED ORDER — METOPROLOL TARTRATE 25 MG PO TABS
25.0000 mg | ORAL_TABLET | Freq: Two times a day (BID) | ORAL | Status: DC
  Administered 2014-10-29 (×2): 25 mg via ORAL
  Filled 2014-10-28 (×2): qty 1

## 2014-10-28 MED ORDER — SODIUM CHLORIDE 0.9 % IJ SOLN: 3.0000 mL | INTRAMUSCULAR | Status: DC | PRN

## 2014-10-28 MED ORDER — SODIUM CHLORIDE 0.9 % IJ SOLN
3.0000 mL | Freq: Two times a day (BID) | INTRAMUSCULAR | Status: DC
  Administered 2014-10-28 – 2014-10-30 (×4): 3 mL via INTRAVENOUS

## 2014-10-28 MED ORDER — ONDANSETRON HCL 4 MG/2ML IJ SOLN
4.0000 mg | Freq: Four times a day (QID) | INTRAMUSCULAR | Status: DC | PRN
Start: 2014-10-28 — End: 2014-11-01

## 2014-10-28 MED ORDER — FUROSEMIDE 20 MG PO TABS
20.0000 mg | ORAL_TABLET | Freq: Two times a day (BID) | ORAL | Status: DC
  Administered 2014-10-28 – 2014-11-01 (×7): 20 mg via ORAL
  Filled 2014-10-28 (×7): qty 1

## 2014-10-28 MED ORDER — ACETAMINOPHEN 500 MG PO TABS: 500.0000 mg | ORAL_TABLET | Freq: Every day | ORAL | Status: DC | PRN

## 2014-10-28 MED ORDER — SODIUM CHLORIDE 0.9 % IV BOLUS (SEPSIS)
500.0000 mL | Freq: Once | INTRAVENOUS | Status: AC
  Administered 2014-10-28: 500 mL via INTRAVENOUS

## 2014-10-28 MED ORDER — VITAMIN D 1000 UNITS PO TABS
2000.0000 [IU] | ORAL_TABLET | Freq: Every day | ORAL | Status: DC
  Administered 2014-10-29 – 2014-11-01 (×4): 2000 [IU] via ORAL
  Filled 2014-10-28 (×8): qty 2

## 2014-10-28 MED ORDER — DOCUSATE SODIUM 100 MG PO CAPS
100.0000 mg | ORAL_CAPSULE | Freq: Two times a day (BID) | ORAL | Status: DC
  Administered 2014-10-28 – 2014-11-01 (×7): 100 mg via ORAL
  Filled 2014-10-28 (×8): qty 1

## 2014-10-28 MED ORDER — SODIUM CHLORIDE 0.9 % IV SOLN: 250.0000 mL | INTRAVENOUS | Status: DC | PRN

## 2014-10-28 MED ORDER — NITROGLYCERIN 0.4 MG SL SUBL
0.4000 mg | SUBLINGUAL_TABLET | SUBLINGUAL | Status: DC | PRN
  Filled 2014-10-28: qty 1

## 2014-10-28 MED ORDER — RIVAROXABAN 20 MG PO TABS
20.0000 mg | ORAL_TABLET | Freq: Every day | ORAL | Status: DC
  Administered 2014-10-28: 20 mg via ORAL
  Filled 2014-10-28: qty 1

## 2014-10-28 MED ORDER — ACETAMINOPHEN 325 MG PO TABS
650.0000 mg | ORAL_TABLET | Freq: Four times a day (QID) | ORAL | Status: DC | PRN
  Administered 2014-10-31: 650 mg via ORAL
  Filled 2014-10-28: qty 2

## 2014-10-28 MED ORDER — ONDANSETRON HCL 4 MG PO TABS: 4.0000 mg | ORAL_TABLET | Freq: Four times a day (QID) | ORAL | Status: DC | PRN

## 2014-10-28 MED ORDER — ASPIRIN 325 MG PO TABS
325.0000 mg | ORAL_TABLET | Freq: Once | ORAL | Status: AC
Start: 2014-10-28 — End: 2014-10-28
  Administered 2014-10-28: 325 mg via ORAL
  Filled 2014-10-28: qty 1

## 2014-10-28 NOTE — ED Notes (Signed)
Pt reports she felt SOB with some chest pressure last night.  Took a Lopressor and felt better.  The symptoms returned this afternoon.  Pt went to [facility] and was found to be in Aflutter at a rate of 140-145.  Pt was c/o SOB upon exertion and chest pressure.

## 2014-10-28 NOTE — H&P (Signed)
[facility] History and Physical  KACIA HALLEY KKX:[id number] DOB: [birth year] DOA: 10/28/2014  Referring physician: Dr. Fransico Michael PCP: [name], MD   Chief Complaint: palpitation and SOB  HPI: Amy [name] is a 69 y.o. female with past menstrual history of chronic atrial fibrillation on Xarelto that comes in for palpitations and shortness of breath on the prior to admission. She took her Lopressor and it improve. She woke up on the day of admission her symptom has resolved then later in the day she started getting chest pressure shortness of breath and palpitations. She went to see her family physician who referred her to the emergency room.  In the ED: Cardiac enzymes were obtained which are mildly elevated, EKG was done that showed atrial fibrillation with a heart rate 140. Cardiology was consulted who recommended to admit and rule out for chest pain and to make sure her heart rate is controlled.  Review of Systems:  Constitutional:  No weight loss, night sweats, Fevers, chills, fatigue.  HEENT:  No headaches, Difficulty swallowing,Tooth/dental problems,Sore throat,  No sneezing, itching, ear ache, nasal congestion, post nasal drip,  Cardio-vascular:  PND, swelling in lower extremities, anasarca, dizziness, palpitations  GI:  No heartburn, indigestion, abdominal pain, nausea, vomiting, diarrhea, change in bowel habits, loss of appetite  Resp:  No shortness of breath with exertion or at rest. No excess mucus, no productive cough, No non-productive cough, No coughing up of blood.No change in color of mucus.No wheezing.No chest wall deformity  Skin:  no rash or lesions.  GU:  no dysuria, change in color of urine, no urgency or frequency. No flank pain.  Musculoskeletal:  No joint pain or swelling. No decreased range of motion. No back pain.  Psych:  No change in mood or affect. No depression or anxiety. No memory loss.   Past Medical History  Diagnosis  Date  . Hypertension   . Hyperlipidemia   . Hypothyroid   . COPD (chronic obstructive pulmonary disease)   . Dizziness     had PT for being off balance - in approx. 2012  . Lumbar disc disease   . Macular degeneration   . Chronic diastolic heart failure   . Apical variant hypertrophic cardiomyopathy     Diagnosed by echo and ECG 11/13  . GERD (gastroesophageal reflux disease)   . Paroxysmal atrial fibrillation/flutter        . Sinus bradycardia    Past Surgical History  Procedure Laterality Date  . Cesarean section  1983  . Cardiac catheterization      normal coronary arteries  . Cataract extraction Bilateral   . Right heart catheterization N/A 06/01/2013    Procedure: RIGHT HEART CATH;  Surgeon: Larey Dresser, MD;  Location[facility] CATH LAB;  Service: Cardiovascular;  Laterality: N/A;   Social History:  reports that she quit smoking about 5 years ago. Her smoking use included Cigarettes. She has a 13.2 pack-year smoking history. She has never used smokeless tobacco. She reports that she drinks alcohol. She reports that she does not use illicit drugs.  Allergies  Allergen Reactions  . Zyban [Bupropion] Itching    Family History  Problem Relation Age of Onset  . Heart disease Sister     Good Pastures Syndrome  . Gout Father   . Lung cancer Father   . Breast cancer Paternal Aunt     Prior to Admission medications   Medication Sig Start Date End Date Taking? Authorizing Provider  acetaminophen (TYLENOL) 500 MG  tablet Take 500 mg by mouth daily as needed (pain). Counteract by Melaleuca   Yes Historical Provider, MD  albuterol (PROVENTIL HFA;VENTOLIN HFA) 108 (90 BASE) MCG/ACT inhaler Inhale 2 puffs into the lungs every 6 (six) hours as needed for wheezing or shortness of breath.    Yes Historical Provider, MD  atorvastatin (LIPITOR) 80 MG tablet Take 1 tablet (80 mg total) by mouth daily. Patient taking differently: Take 80 mg by mouth daily with lunch.  10/23/14  Yes Sherran Needs, NP  Cholecalciferol (VITAMIN D) 2000 UNITS CAPS Take 2,000 Units by mouth daily.    Yes Historical Provider, MD  colchicine 0.6 MG tablet Take 0.6 mg by mouth every other day.    Yes Historical Provider, MD  ezetimibe (ZETIA) 10 MG tablet Take 1 tablet (10 mg total) by mouth daily. Patient taking differently: Take 10 mg by mouth at bedtime.  07/26/14  Yes Sueanne Margarita, MD  furosemide (LASIX) 20 MG tablet Take 1 tablet (20 mg total) by mouth 3 (three) times daily. Patient taking differently: Take 40 mg by mouth See admin instructions. Take 1 tablet (20 mg) every morning, take an additional tablet later in the day if staying at home or if hands are swelling 06/12/14  Yes Sueanne Margarita, MD  levothyroxine (SYNTHROID, LEVOTHROID) 75 MCG tablet Take 75 mcg by mouth daily before breakfast.   Yes Historical Provider, MD  metoprolol tartrate (LOPRESSOR) 25 MG tablet Take 0.5 tablets (12.5 mg total) by mouth 2 (two) times daily. 07/19/14  Yes Sueanne Margarita, MD  nitroGLYCERIN (NITROSTAT) 0.4 MG SL tablet Place 1 tablet (0.4 mg total) under the tongue every 5 (five) minutes as needed for chest pain. 07/19/14  Yes Traci R Turner, MD  XARELTO 20 MG TABS tablet TAKE 1 TABLET BY MOUTH EVERY DAY PER DR TURNER Patient taking differently: TAKE 1 TABLET BY MOUTH DAILY AT BEDTIME (PER DR TURNER) 08/21/14  Yes Sueanne Margarita, MD  zolpidem (AMBIEN) 5 MG tablet Take 2.5 mg by mouth at bedtime as needed for sleep.    Yes Historical Provider, MD   Physical Exam: Filed Vitals:   10/28/14 1642 10/28/14 1700 10/28/14 1715 10/28/14 1730  BP: 126/83 123/88 124/99 124/77  Pulse:  80 80 95  Temp:      TempSrc:      Resp:  _0 Height:      Weight:      SpO2: 97% 99% 96% 96%    Wt Readings from Last 3 Encounters:  10/28/14 95.255 kg (210 lb)  10/23/14 95.255 kg (210 lb)  09/12/14 95.255 kg (210 lb)    General:  Appears calm and comfortable Eyes: PERRL, normal lids, irises & conjunctiva ENT: grossly  normal hearing, lips & tongue Neck: no LAD, masses or thyromegaly Cardiovascular: RRR, no m/r/g. No LE edema. Telemetry: SR, no arrhythmias  Respiratory: CTA bilaterally, no w/r/r. Normal respiratory effort. Abdomen: soft, ntnd Skin: no rash or induration seen on limited exam Musculoskeletal: grossly normal tone BUE/BLE Psychiatric: grossly normal mood and affect, speech fluent and appropriate Neurologic: grossly non-focal.          Labs on Admission:  Basic Metabolic Panel:  Recent Labs Lab 10/28/14 1608  NA 138  K 3.9  CL 109  CO2 18*  GLUCOSE 90  BUN 11  CREATININE 1.15*  CALCIUM 8.7*   Liver Function Tests: No results for input(s): AST, ALT, ALKPHOS, BILITOT, PROT, ALBUMIN in the last 168 hours. No  results for input(s): LIPASE, AMYLASE in the last 168 hours. No results for input(s): AMMONIA in the last 168 hours. CBC:  Recent Labs Lab 10/28/14 1608  WBC 8.0  HGB 14.2  HCT 43.3  MCV 90.2  PLT 263   Cardiac Enzymes: No results for input(s): CKTOTAL, CKMB, CKMBINDEX, TROPONINI in the last 168 hours.  BNP (last 3 results)  Recent Labs  06/25/14 1535  BNP 309.9*    ProBNP (last 3 results)  Recent Labs  04/12/14 1423 04/21/14 1016  PROBNP 156.0* 86.0    CBG: No results for input(s): GLUCAP in the last 168 hours.  Radiological Exams on Admission: Dg Chest 2 View  10/28/2014   CLINICAL DATA:  Shortness of breath with chest pressure  EXAM: CHEST  2 VIEW  COMPARISON:  June 25, 2014  FINDINGS: There is mild scarring in the lingula, stable. Lungs elsewhere clear. Heart is borderline enlarged with pulmonary vascularity within normal limits. No adenopathy. No bone lesions.  IMPRESSION: Mild scarring in the lingula. No edema or consolidation. Stable cardiac prominence.   Electronically Signed   By: Lowella Grip III M.D.   On: 10/28/2014 16:37    EKG: Independently reviewed. Sinus tachycardia with LVH and T-wave  inversions.  Assessment/Plan Paroxysmal atrial fibrillation/flutter Now back in sinus rhythm. Admitted to telemetry increase metoprolol. She just recently had a Holter monitor the atrial fibrillation clinic. The clinic has not disclosed the results of the test. Watch her overnight is no events will discuss with cardiology and can probably follow up with the clinic as an outpatient.  Chest pain Admitted to telemetry cycle cardiac enzymes 3. She has stress test on 09/12/2014 that showed no risk. The elevation of her troponins is likely due to the episode of paroxysmal atrial fibrillation. ED physician and spoke to the cardiologist recommended to admit her and rule her out.  Hypothyroidism Check TSH continue Synthroid.    Chronic diastolic heart failure Seems to be euvolemic continue current regimen.      Code Status: full DVT Prophylaxis:Xarelto Family Communication: none Disposition Plan: inpatient  Time spent: 60 min  Charlynne Cousins [facility] Pager [phone]

## 2014-10-28 NOTE — ED Notes (Signed)
Hospitalist at bedside.

## 2014-10-28 NOTE — ED Provider Notes (Signed)
CSN: [id number]     Arrival date & time 10/28/14  1543 History   First MD Initiated Contact with Patient 10/28/14 1559     Chief Complaint  Patient presents with  . Irregular Heart Beat     (Consider location/radiation/quality/duration/timing/severity/associated sxs/prior Treatment) HPI Patient onset of racing heart yesterday evening and shortness of breath. She reports she has some chest pressure as well. She tried a half of her Lopressor and did not get improvement. She then took another half before she went to sleep and felt improved. She woke up in the morning and the symptoms seemed to be resolved however shortly after she got up the racing heart resumed and she again had chest pressure short of breath. She was seen at her family 68 office and referred to the emergency department for treatment. He denies any syncopal episodes. Past Medical History  Diagnosis Date  . Hypertension   . Hyperlipidemia   . Hypothyroid   . COPD (chronic obstructive pulmonary disease)   . Dizziness     had PT for being off balance - in approx. 2012  . Lumbar disc disease   . Macular degeneration   . Chronic diastolic heart failure   . Apical variant hypertrophic cardiomyopathy     Diagnosed by echo and ECG 11/13  . GERD (gastroesophageal reflux disease)   . Paroxysmal atrial fibrillation/flutter        . Sinus bradycardia    Past Surgical History  Procedure Laterality Date  . Cesarean section  1983  . Cardiac catheterization      normal coronary arteries  . Cataract extraction Bilateral   . Right heart catheterization N/A 06/01/2013    Procedure: RIGHT HEART CATH;  Surgeon: Larey Dresser, MD;  Location: [facility] CATH LAB;  Service: Cardiovascular;  Laterality: N/A;   Family History  Problem Relation Age of Onset  . Heart disease Sister     Good Pastures Syndrome  . Gout Father   . Lung cancer Father   . Breast cancer Paternal Aunt    History  Substance Use Topics  . Smoking status:  Former Smoker -- 0.30 packs/day for 44 years    Types: Cigarettes    Quit date: 04/07/2009  . Smokeless tobacco: Never Used  . Alcohol Use: Yes     Comment: 2 to 3 glasses wine per week   OB History    No data available     Review of Systems  10 Systems reviewed and are negative for acute change except as noted in the HPI.   Allergies  Zyban  Home Medications   Prior to Admission medications   Medication Sig Start Date End Date Taking? Authorizing Provider  acetaminophen (TYLENOL) 500 MG tablet Take 500 mg by mouth daily as needed (pain). Counteract by Melaleuca   Yes Historical Provider, MD  albuterol (PROVENTIL HFA;VENTOLIN HFA) 108 (90 BASE) MCG/ACT inhaler Inhale 2 puffs into the lungs every 6 (six) hours as needed for wheezing or shortness of breath.    Yes Historical Provider, MD  atorvastatin (LIPITOR) 80 MG tablet Take 1 tablet (80 mg total) by mouth daily. Patient taking differently: Take 80 mg by mouth daily with lunch.  10/23/14  Yes Sherran Needs, NP  Cholecalciferol (VITAMIN D) 2000 UNITS CAPS Take 2,000 Units by mouth daily.    Yes Historical Provider, MD  colchicine 0.6 MG tablet Take 0.6 mg by mouth every other day.    Yes Historical Provider, MD  ezetimibe (ZETIA) 10 MG  tablet Take 1 tablet (10 mg total) by mouth daily. Patient taking differently: Take 10 mg by mouth at bedtime.  07/26/14  Yes Sueanne Margarita, MD  furosemide (LASIX) 20 MG tablet Take 1 tablet (20 mg total) by mouth 3 (three) times daily. Patient taking differently: Take 40 mg by mouth See admin instructions. Take 1 tablet (20 mg) every morning, take an additional tablet later in the day if staying at home or if hands are swelling 06/12/14  Yes Sueanne Margarita, MD  levothyroxine (SYNTHROID, LEVOTHROID) 75 MCG tablet Take 75 mcg by mouth daily before breakfast.   Yes Historical Provider, MD  metoprolol tartrate (LOPRESSOR) 25 MG tablet Take 0.5 tablets (12.5 mg total) by mouth 2 (two) times daily. 07/19/14   Yes Sueanne Margarita, MD  nitroGLYCERIN (NITROSTAT) 0.4 MG SL tablet Place 1 tablet (0.4 mg total) under the tongue every 5 (five) minutes as needed for chest pain. 07/19/14  Yes Traci R Turner, MD  XARELTO 20 MG TABS tablet TAKE 1 TABLET BY MOUTH EVERY DAY PER DR TURNER Patient taking differently: TAKE 1 TABLET BY MOUTH DAILY AT BEDTIME (PER DR TURNER) 08/21/14  Yes Sueanne Margarita, MD  zolpidem (AMBIEN) 5 MG tablet Take 2.5 mg by mouth at bedtime as needed for sleep.    Yes Historical Provider, MD   BP 124/77 mmHg  Pulse 95  Temp(Src) 97.3 F (36.3 C) (Oral)  Resp 21  Ht _0  (1.6 m)  Wt 210 lb (95.255 kg)  BMI 37.21 kg/m2  SpO2 96% Physical Exam  Constitutional: She is oriented to person, place, and time. She appears well-developed and well-nourished.  HENT:  Head: Normocephalic and atraumatic.  Eyes: EOM are normal. Pupils are equal, round, and reactive to light.  Neck: Neck supple.  Cardiovascular: Normal heart sounds and intact distal pulses.   Tachycardia irregularly irregular  Pulmonary/Chest: Effort normal and breath sounds normal.  Abdominal: Soft. Bowel sounds are normal. She exhibits no distension. There is no tenderness.  Musculoskeletal: Normal range of motion. She exhibits no edema.  Neurological: She is alert and oriented to person, place, and time. She has normal strength. Coordination normal. GCS eye subscore is 4. GCS verbal subscore is 5. GCS motor subscore is 6.  Skin: Skin is warm, dry and intact.  Psychiatric: She has a normal mood and affect.    ED Course  Procedures (including critical care time) Labs Review Labs Reviewed  BASIC METABOLIC PANEL - Abnormal; Notable for the following:    CO2 18 (*)    Creatinine, Ser 1.15 (*)    Calcium 8.7 (*)    GFR calc non Af Amer 47 (*)    GFR calc Af Amer 55 (*)    All other components within normal limits  CBC - Abnormal; Notable for the following:    RDW 16.1 (*)    All other components within normal limits   I-STAT TROPOININ, ED - Abnormal; Notable for the following:    Troponin i, poc 0.34 (*)    All other components within normal limits    Imaging Review Dg Chest 2 View  10/28/2014   CLINICAL DATA:  Shortness of breath with chest pressure  EXAM: CHEST  2 VIEW  COMPARISON:  June 25, 2014  FINDINGS: There is mild scarring in the lingula, stable. Lungs elsewhere clear. Heart is borderline enlarged with pulmonary vascularity within normal limits. No adenopathy. No bone lesions.  IMPRESSION: Mild scarring in the lingula. No edema or consolidation.  Stable cardiac prominence.   Electronically Signed   By: Lowella Grip III M.D.   On: 10/28/2014 16:37     EKG Interpretation   Date/Time:  Saturday October 28 2014 15:53:30 EDT Ventricular Rate:  140 PR Interval:  79 QRS Duration: 82 QT Interval:  370 QTC Calculation: 565 R Axis:   48 Text Interpretation:  Probable LVH with secondary repol abnrm ST  depression, consider ischemia, diffuse lds Abnormal T, probable ischemia,  lateral leads Prolonged QT interval atrial flutter Confirmed by Johnney Killian,  MD, Jeannie Done [phone]) on 10/28/2014 6:02:47 PM     consult: Patient's case reviewed with Dr. Golden Hurter. At this time the patient's rate has improved with beta blocker administration. She does have elevated troponin. Dr. Radford Pax suggests that the patient has small vessel disease but no large vessel disease. She counseled for admission to the hospitalist with continued rate control and rule out  CRITICAL CARE Performed by: Charlesetta Shanks   Total critical care time: 30  Critical care time was exclusive of separately billable procedures and treating other patients.  Critical care was necessary to treat or prevent imminent or life-threatening deterioration.  Critical care was time spent personally by me on the following activities: development of treatment plan with patient and/or surrogate as well as nursing, discussions with consultants, evaluation of  patient's response to treatment, examination of patient, obtaining history from patient or surrogate, ordering and performing treatments and interventions, ordering and review of laboratory studies, ordering and review of radiographic studies, pulse oximetry and re-evaluation of patient's condition. MDM   Final diagnoses:  Atrial fibrillation with rapid ventricular response   Patient presented with a tachycardic rhythm at 140. By history the patient had atrial fibrillation and flutter. Initial EKG showed a probable flutter. She has had improved rate control with Lopressor. Patient reports that at this point she has no further chest pain. She did however have a mild troponin elevation and will be admitted for ongoing observation and treatment of H of fibrillation with rapid ventricular response.    Charlesetta Shanks, MD 10/28/14 [phone]

## 2014-10-28 NOTE — ED Notes (Signed)
Istat Trop. Results reported to Dr. Merryl Hacker. Ed-Lab.

## 2014-10-29 DIAGNOSIS — N189 Chronic kidney disease, unspecified: Secondary | ICD-10-CM

## 2014-10-29 DIAGNOSIS — E785 Hyperlipidemia, unspecified: Secondary | ICD-10-CM | POA: Diagnosis present

## 2014-10-29 DIAGNOSIS — I5032 Chronic diastolic (congestive) heart failure: Secondary | ICD-10-CM

## 2014-10-29 DIAGNOSIS — I129 Hypertensive chronic kidney disease with stage 1 through stage 4 chronic kidney disease, or unspecified chronic kidney disease: Secondary | ICD-10-CM | POA: Diagnosis present

## 2014-10-29 DIAGNOSIS — N183 Chronic kidney disease, stage 3 (moderate): Secondary | ICD-10-CM | POA: Diagnosis present

## 2014-10-29 DIAGNOSIS — Z6838 Body mass index (BMI) 38.0-38.9, adult: Secondary | ICD-10-CM | POA: Diagnosis not present

## 2014-10-29 DIAGNOSIS — I214 Non-ST elevation (NSTEMI) myocardial infarction: Secondary | ICD-10-CM | POA: Diagnosis not present

## 2014-10-29 DIAGNOSIS — I2510 Atherosclerotic heart disease of native coronary artery without angina pectoris: Secondary | ICD-10-CM | POA: Diagnosis not present

## 2014-10-29 DIAGNOSIS — I422 Other hypertrophic cardiomyopathy: Secondary | ICD-10-CM | POA: Diagnosis not present

## 2014-10-29 DIAGNOSIS — Z7901 Long term (current) use of anticoagulants: Secondary | ICD-10-CM | POA: Diagnosis not present

## 2014-10-29 DIAGNOSIS — R0600 Dyspnea, unspecified: Secondary | ICD-10-CM | POA: Diagnosis not present

## 2014-10-29 DIAGNOSIS — I484 Atypical atrial flutter: Secondary | ICD-10-CM | POA: Diagnosis not present

## 2014-10-29 DIAGNOSIS — G4733 Obstructive sleep apnea (adult) (pediatric): Secondary | ICD-10-CM | POA: Diagnosis present

## 2014-10-29 DIAGNOSIS — I480 Paroxysmal atrial fibrillation: Secondary | ICD-10-CM | POA: Diagnosis not present

## 2014-10-29 DIAGNOSIS — R7989 Other specified abnormal findings of blood chemistry: Secondary | ICD-10-CM | POA: Diagnosis not present

## 2014-10-29 DIAGNOSIS — I10 Essential (primary) hypertension: Secondary | ICD-10-CM | POA: Diagnosis not present

## 2014-10-29 DIAGNOSIS — I4891 Unspecified atrial fibrillation: Secondary | ICD-10-CM | POA: Diagnosis not present

## 2014-10-29 DIAGNOSIS — E669 Obesity, unspecified: Secondary | ICD-10-CM | POA: Diagnosis present

## 2014-10-29 DIAGNOSIS — J449 Chronic obstructive pulmonary disease, unspecified: Secondary | ICD-10-CM | POA: Diagnosis present

## 2014-10-29 DIAGNOSIS — I4892 Unspecified atrial flutter: Secondary | ICD-10-CM

## 2014-10-29 DIAGNOSIS — I252 Old myocardial infarction: Secondary | ICD-10-CM | POA: Diagnosis not present

## 2014-10-29 DIAGNOSIS — R000 Tachycardia, unspecified: Secondary | ICD-10-CM | POA: Diagnosis not present

## 2014-10-29 DIAGNOSIS — Z87891 Personal history of nicotine dependence: Secondary | ICD-10-CM | POA: Diagnosis not present

## 2014-10-29 DIAGNOSIS — I25118 Atherosclerotic heart disease of native coronary artery with other forms of angina pectoris: Secondary | ICD-10-CM | POA: Diagnosis not present

## 2014-10-29 DIAGNOSIS — I248 Other forms of acute ischemic heart disease: Secondary | ICD-10-CM | POA: Diagnosis present

## 2014-10-29 DIAGNOSIS — H3530 Unspecified macular degeneration: Secondary | ICD-10-CM | POA: Diagnosis present

## 2014-10-29 DIAGNOSIS — Z8249 Family history of ischemic heart disease and other diseases of the circulatory system: Secondary | ICD-10-CM | POA: Diagnosis not present

## 2014-10-29 DIAGNOSIS — I272 Other secondary pulmonary hypertension: Secondary | ICD-10-CM | POA: Diagnosis present

## 2014-10-29 DIAGNOSIS — I4430 Unspecified atrioventricular block: Secondary | ICD-10-CM | POA: Diagnosis present

## 2014-10-29 DIAGNOSIS — E039 Hypothyroidism, unspecified: Secondary | ICD-10-CM | POA: Diagnosis present

## 2014-10-29 DIAGNOSIS — Z888 Allergy status to other drugs, medicaments and biological substances status: Secondary | ICD-10-CM | POA: Diagnosis not present

## 2014-10-29 LAB — BASIC METABOLIC PANEL
ANION GAP: 9 (ref 5–15)
BUN: 13 mg/dL (ref 6–20)
CHLORIDE: 109 mmol/L (ref 101–111)
CO2: 22 mmol/L (ref 22–32)
Calcium: 8.8 mg/dL — ABNORMAL LOW (ref 8.9–10.3)
Creatinine, Ser: 1.22 mg/dL — ABNORMAL HIGH (ref 0.44–1.00)
GFR, EST AFRICAN AMERICAN: 51 mL/min — AB (ref >60)
GFR, EST NON AFRICAN AMERICAN: 44 mL/min — AB (ref >60)
Glucose, Bld: 109 mg/dL — ABNORMAL HIGH (ref 65–99)
Potassium: 3.7 mmol/L (ref 3.5–5.1)
SODIUM: 140 mmol/L (ref 135–145)

## 2014-10-29 LAB — TROPONIN I
Troponin I: 1.12 ng/mL (ref <0.031)
Troponin I: 1.35 ng/mL (ref <0.031)
Troponin I: 1.33 ng/mL (ref <0.031)
Troponin I: 1.37 ng/mL (ref <0.031)

## 2014-10-29 LAB — CBC
HEMATOCRIT: 42.6 % (ref 36.0–46.0)
HEMOGLOBIN: 14.1 g/dL (ref 12.0–15.0)
MCH: 30.3 pg (ref 26.0–34.0)
MCHC: 33.1 g/dL (ref 30.0–36.0)
MCV: 91.6 fL (ref 78.0–100.0)
Platelets: 263 10*3/uL (ref 150–400)
RBC: 4.65 MIL/uL (ref 3.87–5.11)
RDW: 16.2 % — ABNORMAL HIGH (ref 11.5–15.5)
WBC: 9.4 10*3/uL (ref 4.0–10.5)

## 2014-10-29 LAB — APTT: aPTT: 50 seconds — ABNORMAL HIGH (ref 24–37)

## 2014-10-29 LAB — HEPARIN LEVEL (UNFRACTIONATED)

## 2014-10-29 MED ORDER — HEPARIN (PORCINE) IN NACL 100-0.45 UNIT/ML-% IJ SOLN
850.0000 [IU]/h | INTRAMUSCULAR | Status: DC
  Administered 2014-10-29: 1000 [IU]/h via INTRAVENOUS
  Filled 2014-10-29 (×2): qty 250

## 2014-10-29 NOTE — Progress Notes (Signed)
[facility] PROGRESS NOTE  Assessment/Plan: Chest pain: - Chest pain free today, troponin's keep going up. Echo pending. - cardiology consulted. - on asa, BB, statins and NTG prn. - stress test negative for reversible ischemia on May 2016, cardiac cath on 2013 showed LAD 80% stenosis in small D1 to small for PCI.  Atrial fibrillation with RVR/  Paroxysmal atrial fibrillation/flutter: - now rate controlled and Sinus rhtythm. - on Xarelto.  Chronic diastolic heart failure - Euvolemic cont current therapy.  Hypothyroidism - mildly elevated repeat in 6 weeks.    Code Status: full Family Communication: noen  Disposition Plan: observation   Consultants:  cardiology  Procedures:  Echo  Antibiotics:  None  HPI/Subjective: Chest pain and SOB free, is hungry will like to eat  Objective: Filed Vitals:   10/28/14 1943 10/28/14 2230 10/28/14 2305 10/29/14 0617  BP: 141/71 103/52 103/52 134/72  Pulse: 68 62 58 66  Temp:    98.4 F (36.9 C)  TempSrc:    Oral  Resp:  18  18  Height:      Weight:    96.616 kg (213 lb)  SpO2: 97% 97%  97%    Intake/Output Summary (Last 24 hours) at 10/29/14 1138 Last data filed at 10/29/14 1111  Gross per 24 hour  Intake      3 ml  Output    500 ml  Net   -497 ml   Filed Weights   10/28/14 1552 10/29/14 0617  Weight: 95.255 kg (210 lb) 96.616 kg (213 lb)    Exam:  General: Alert, awake, oriented x3, in no acute distress.  HEENT: No bruits, no goiter.  Heart: Regular rate and rhythm. Lungs: Good air movement, clear Abdomen: Soft, nontender, nondistended, positive bowel sounds.  Neuro: Grossly intact, nonfocal.   Data Reviewed: Basic Metabolic Panel:  Recent Labs Lab 10/28/14 1608 10/29/14 0052  NA 138 140  K 3.9 3.7  CL 109 109  CO2 18* 22  GLUCOSE 90 109*  BUN 11 13  CREATININE 1.15* 1.22*  CALCIUM 8.7* 8.8*   Liver Function Tests: No results for input(s): AST, ALT, ALKPHOS, BILITOT, PROT, ALBUMIN  in the last 168 hours. No results for input(s): LIPASE, AMYLASE in the last 168 hours. No results for input(s): AMMONIA in the last 168 hours. CBC:  Recent Labs Lab 10/28/14 1608 10/29/14 0052  WBC 8.0 9.4  HGB 14.2 14.1  HCT 43.3 42.6  MCV 90.2 91.6  PLT 263 263   Cardiac Enzymes:  Recent Labs Lab 10/28/14 1921 10/29/14 0052 10/29/14 0822  TROPONINI 0.71* 1.12* 1.35*   BNP (last 3 results)  Recent Labs  06/25/14 1535  BNP 309.9*    ProBNP (last 3 results)  Recent Labs  04/12/14 1423 04/21/14 1016  PROBNP 156.0* 86.0    CBG: No results for input(s): GLUCAP in the last 168 hours.  No results found for this or any previous visit (from the past 240 hour(s)).   Studies: Dg Chest 2 View  10/28/2014   CLINICAL DATA:  Shortness of breath with chest pressure  EXAM: CHEST  2 VIEW  COMPARISON:  June 25, 2014  FINDINGS: There is mild scarring in the lingula, stable. Lungs elsewhere clear. Heart is borderline enlarged with pulmonary vascularity within normal limits. No adenopathy. No bone lesions.  IMPRESSION: Mild scarring in the lingula. No edema or consolidation. Stable cardiac prominence.   Electronically Signed   By: Lowella Grip III M.D.   On: 10/28/2014 16:37  Scheduled Meds: . atorvastatin  80 mg Oral Q lunch  . cholecalciferol  2,000 Units Oral Daily  . [START ON 10/30/2014] colchicine  0.6 mg Oral QODAY  . docusate sodium  100 mg Oral BID  . ezetimibe  10 mg Oral Daily  . furosemide  20 mg Oral BID  . levothyroxine  75 mcg Oral QAC breakfast  . metoprolol tartrate  25 mg Oral BID  . rivaroxaban  20 mg Oral QHS  . sodium chloride  3 mL Intravenous Q12H  . sodium chloride  3 mL Intravenous Q12H   Continuous Infusions:   Time Spent: 25 min   Charlynne Cousins  [facility] Pager [phone]. If 7PM-7AM, please contact night-coverage at [email or web address], password [email or web address] 10/29/2014, 11:38 AM

## 2014-10-29 NOTE — Progress Notes (Signed)
ANTICOAGULATION CONSULT NOTE - Initial Consult  Pharmacy Consult for heparin Indication: chest pain/ACS  Allergies  Allergen Reactions  . Zyban [Bupropion] Itching    Patient Measurements: Height: _0  (160 cm) Weight: 213 lb (96.616 kg) IBW/kg (Calculated) : 52.4 Heparin Dosing Weight: 74kg  Vital Signs: Temp: 98.4 F (36.9 C) (07/24 0617) Temp Source: Oral (07/24 0617) BP: 134/72 mmHg (07/24 0617) Pulse Rate: 66 (07/24 0617)  Labs:  Recent Labs  10/28/14 1608 10/28/14 1921 10/29/14 0052 10/29/14 0822  HGB 14.2  --  14.1  --   HCT 43.3  --  42.6  --   PLT 263  --  263  --   CREATININE 1.15*  --  1.22*  --   TROPONINI  --  0.71* 1.12* 1.35*    Estimated Creatinine Clearance: 48.2 mL/min (by C-G formula based on Cr of 1.22).   Medical History: Past Medical History  Diagnosis Date  . Hypertension   . Hyperlipidemia   . Hypothyroid   . COPD (chronic obstructive pulmonary disease)   . Dizziness     had PT for being off balance - in approx. 2012  . Lumbar disc disease   . Macular degeneration   . Chronic diastolic heart failure   . Apical variant hypertrophic cardiomyopathy     Diagnosed by echo and ECG 11/13  . GERD (gastroesophageal reflux disease)   . Paroxysmal atrial fibrillation/flutter        . Sinus bradycardia     Medications:  Prescriptions prior to admission  Medication Sig Dispense Refill Last Dose  . acetaminophen (TYLENOL) 500 MG tablet Take 500 mg by mouth daily as needed (pain). Counteract by Melaleuca   week ago  . albuterol (PROVENTIL HFA;VENTOLIN HFA) 108 (90 BASE) MCG/ACT inhaler Inhale 2 puffs into the lungs every 6 (six) hours as needed for wheezing or shortness of breath.    10/28/2014 at Unknown time  . atorvastatin (LIPITOR) 80 MG tablet Take 1 tablet (80 mg total) by mouth daily. (Patient taking differently: Take 80 mg by mouth daily with lunch. ) 30 tablet 6 10/28/2014 at Unknown time  . Cholecalciferol (VITAMIN D) 2000 UNITS  CAPS Take 2,000 Units by mouth daily.    10/28/2014 at Unknown time  . colchicine 0.6 MG tablet Take 0.6 mg by mouth every other day.    10/28/2014 at Unknown time  . ezetimibe (ZETIA) 10 MG tablet Take 1 tablet (10 mg total) by mouth daily. (Patient taking differently: Take 10 mg by mouth at bedtime. ) 30 tablet 6 10/27/2014 at Unknown time  . furosemide (LASIX) 20 MG tablet Take 1 tablet (20 mg total) by mouth 3 (three) times daily. (Patient taking differently: Take 40 mg by mouth See admin instructions. Take 1 tablet (20 mg) every morning, take an additional tablet later in the day if staying at home or if hands are swelling) 60 tablet 5 10/27/2014 at Unknown time  . levothyroxine (SYNTHROID, LEVOTHROID) 75 MCG tablet Take 75 mcg by mouth daily before breakfast.   10/28/2014 at Unknown time  . metoprolol tartrate (LOPRESSOR) 25 MG tablet Take 0.5 tablets (12.5 mg total) by mouth 2 (two) times daily. 60 tablet 6 10/28/2014 at noon  . nitroGLYCERIN (NITROSTAT) 0.4 MG SL tablet Place 1 tablet (0.4 mg total) under the tongue every 5 (five) minutes as needed for chest pain. 25 tablet 3 several months ago  . XARELTO 20 MG TABS tablet TAKE 1 TABLET BY MOUTH EVERY DAY PER DR TURNER (Patient  taking differently: TAKE 1 TABLET BY MOUTH DAILY AT BEDTIME (PER DR TURNER)) 30 tablet 4 10/27/2014 at Unknown time  . zolpidem (AMBIEN) 5 MG tablet Take 2.5 mg by mouth at bedtime as needed for sleep.    10/27/2014 at Unknown time   Scheduled:  . atorvastatin  80 mg Oral Q lunch  . cholecalciferol  2,000 Units Oral Daily  . [START ON 10/30/2014] colchicine  0.6 mg Oral QODAY  . docusate sodium  100 mg Oral BID  . ezetimibe  10 mg Oral Daily  . furosemide  20 mg Oral BID  . levothyroxine  75 mcg Oral QAC breakfast  . metoprolol tartrate  25 mg Oral BID  . rivaroxaban  20 mg Oral QHS  . sodium chloride  3 mL Intravenous Q12H  . sodium chloride  3 mL Intravenous Q12H    Assessment: 69 yo female with NSTEMI to begin  heparin. She was on xarelto (on PTA) for afib (last dose 7/23 at ~ 9pm). Hg= 14.1, plt= 263.  Goal of Therapy:  Heparin level= 0.3-0.7 APTT 66-102 Monitor platelets by anticoagulation protocol: Yes   Plan:   -Begin heparin at 1000 units/hr (~ 14 units/hr) at 9pm -Heparin level and aPTT in am  -Daily CBC  Hildred Laser, Pharm D 10/29/2014 12:49 PM

## 2014-10-29 NOTE — Consult Note (Addendum)
Cardiology Consultation   Patient ID: Amy [name]; [id number]; 17-Jul-1945   Admit date: 10/28/2014 Date of Consult: 10/29/2014  Primary Physician: [name], MD Cardiologist: Dr. Fransico Him  Electrophysiologist:  Dr. Deeann Cree, NP ([facility]ib [facility])  Reason for Consultation: Atrial Flutter  History of Present Illness: Amy [name] is a 69 y.o. female with a hx of PAF on chronic anticoagulation, HTN, HL, diastolic HF, OSA.  LHC in 10/2011 demonstrated non-obstructive disease and an 80% lesion in a small D1 treated medically.Admitted 11/13 with non-STEMI in setting of AF with RVR felt to be related to demand ischemia. Echo at that time demonstrated apical hypertrophy and QT prolongation. She was not a candidate for Class 1c drugs and could only be placed on amiodarone. She was evaluated 2015 by pulmonology for dyspnea. Echocardiogram was suggestive of significant pulmonary hypertension. Right heart catheterization was significant for mild pulmonary hypertension. Pulmonology stopped her amiodarone due to fears of amiodarone toxicity. She was seen in the emergency room 3/16 with recurrent atrial fibrillation.  She was treated with prn beta-blocker.  She was most recently set up by Dr. Radford Pax for Myoview and an event monitor secondary to chest pain and palpitations. Myoview was low risk and negative for ischemia or scar. Event monitor demonstrated paroxysms of atrial fibrillation with heart rates up to 165. She was just seen in the atrial fibrillation clinic 10/23/14 by Roderic Palau, NP. Further recommendations are pending.  She presented to the hospital yesterday with complaints of dyspnea and palpitations. She did have some improvement with as needed beta blocker. She was found to be in atrial flutter with heart rate of 140. Troponins were minimally elevated. Cardiology is asked to further evaluate.  The patient notes that she has chronic DOE.  She feels that  this has worsened over the past few mos.  Weights have been stable.  She is NYHA 3.  She denies orthopnea, PND, edema.  She has noted chest tightness with activity for many months without significant change.  She developed rapid palpitations yesterday and went to urgent.  When she was noted to have an elevated HR, she was sent to [facility] ED.  She is current pain free.  She has not had any further palpitations since admission.     Previous Studies  Myoview 09/12/14  This is a low risk study with no evidence of scar or ischemia.  Normal LVEF.  Event monitor 09/2014  NSR with heart ranging from 45 to 165 bpm  PAC's  Atrial fibrillation with controlled ventricular response up to 165 bpm   Echo 04/25/13 Mild LVH, EF 55-60% Mild MR Moderate LAE PASP 72 mmHg   RHC 05/2013 Procedural Findings: Hemodynamics (mmHg) RA mean 4 RV 42/14 PA 45/20, mean 29 PCWP mean 14 Oxygen saturations: PA 53% AO 87% Cardiac Output (Fick) 4.36  Cardiac Index (Fick) 2.25 PVR 3.4 WU Cardiac Output (Thermodilution) 4.88 Cardiac Index (Thermodilution) 2.52 PVR 3.1 WU Final Conclusions: There is mild pulmonary arterial hypertension. Normal PCWP and RA pressure. This degree of pulmonary hypertension could be related to OSA/low oxygen saturation. Would make sure that OSA is adequately treated initially. Probably would not use pulmonary vasodilators with this degree of PAH.   LHC 10/2011 Normal left main coronary artery. Normal left anterior descending artery with an 80% stenosis in the proximal portion of a small D1 too small for PCI Normal left circumflex artery and its branches - left dominant Normal right coronary artery. Normal left ventricular systolic function. LVEDP 21  mmHg. Ejection fraction 60%. Elevated LVEDP c/w diastolic dysfunction   Past Medical History  Diagnosis Date  . Hypertension   . Hyperlipidemia   . Hypothyroid   . COPD (chronic obstructive pulmonary disease)   .  Dizziness     had PT for being off balance - in approx. 2012  . Lumbar disc disease   . Macular degeneration   . Chronic diastolic heart failure   . Apical variant hypertrophic cardiomyopathy     Diagnosed by echo and ECG 11/13  . GERD (gastroesophageal reflux disease)   . Paroxysmal atrial fibrillation/flutter        . Sinus bradycardia     Past Surgical History  Procedure Laterality Date  . Cesarean section  1983  . Cardiac catheterization      normal coronary arteries  . Cataract extraction Bilateral   . Right heart catheterization N/A 06/01/2013    Procedure: RIGHT HEART CATH;  Surgeon: Larey Dresser, MD;  Location: [facility] CATH LAB;  Service: Cardiovascular;  Laterality: N/A;      Home Meds: Prior to Admission medications   Medication Sig Start Date End Date Taking? Authorizing Provider  acetaminophen (TYLENOL) 500 MG tablet Take 500 mg by mouth daily as needed (pain). Counteract by Melaleuca   Yes Historical Provider, MD  albuterol (PROVENTIL HFA;VENTOLIN HFA) 108 (90 BASE) MCG/ACT inhaler Inhale 2 puffs into the lungs every 6 (six) hours as needed for wheezing or shortness of breath.    Yes Historical Provider, MD  atorvastatin (LIPITOR) 80 MG tablet Take 1 tablet (80 mg total) by mouth daily. Patient taking differently: Take 80 mg by mouth daily with lunch.  10/23/14  Yes Sherran Needs, NP  Cholecalciferol (VITAMIN D) 2000 UNITS CAPS Take 2,000 Units by mouth daily.    Yes Historical Provider, MD  colchicine 0.6 MG tablet Take 0.6 mg by mouth every other day.    Yes Historical Provider, MD  ezetimibe (ZETIA) 10 MG tablet Take 1 tablet (10 mg total) by mouth daily. Patient taking differently: Take 10 mg by mouth at bedtime.  07/26/14  Yes Sueanne Margarita, MD  furosemide (LASIX) 20 MG tablet Take 1 tablet (20 mg total) by mouth 3 (three) times daily. Patient taking differently: Take 40 mg by mouth See admin instructions. Take 1 tablet (20 mg) every morning, take an additional  tablet later in the day if staying at home or if hands are swelling 06/12/14  Yes Sueanne Margarita, MD  levothyroxine (SYNTHROID, LEVOTHROID) 75 MCG tablet Take 75 mcg by mouth daily before breakfast.   Yes Historical Provider, MD  metoprolol tartrate (LOPRESSOR) 25 MG tablet Take 0.5 tablets (12.5 mg total) by mouth 2 (two) times daily. 07/19/14  Yes Sueanne Margarita, MD  nitroGLYCERIN (NITROSTAT) 0.4 MG SL tablet Place 1 tablet (0.4 mg total) under the tongue every 5 (five) minutes as needed for chest pain. 07/19/14  Yes Traci R Turner, MD  XARELTO 20 MG TABS tablet TAKE 1 TABLET BY MOUTH EVERY DAY PER DR TURNER Patient taking differently: TAKE 1 TABLET BY MOUTH DAILY AT BEDTIME (PER DR TURNER) 08/21/14  Yes Sueanne Margarita, MD  zolpidem (AMBIEN) 5 MG tablet Take 2.5 mg by mouth at bedtime as needed for sleep.    Yes Historical Provider, MD     Current Medications: . atorvastatin  80 mg Oral Q lunch  . cholecalciferol  2,000 Units Oral Daily  . [START ON 10/30/2014] colchicine  0.6 mg Oral QODAY  .  docusate sodium  100 mg Oral BID  . ezetimibe  10 mg Oral Daily  . furosemide  20 mg Oral BID  . levothyroxine  75 mcg Oral QAC breakfast  . metoprolol tartrate  25 mg Oral BID  . rivaroxaban  20 mg Oral QHS  . sodium chloride  3 mL Intravenous Q12H  . sodium chloride  3 mL Intravenous Q12H      Allergies:    Allergies  Allergen Reactions  . Zyban [Bupropion] Itching     Social History:   The patient  reports that she quit smoking about 5 years ago. Her smoking use included Cigarettes. She has a 13.2 pack-year smoking history. She has never used smokeless tobacco. She reports that she drinks alcohol. She reports that she does not use illicit drugs.     Family History:   The patient's family history includes Breast cancer in her paternal aunt; Gout in her father; Heart disease in her sister; Lung cancer in her father.   ROS:  Review of Systems  Constitution: Negative for fever.  Respiratory:  Negative for cough.   Gastrointestinal: Negative for hematochezia and melena.  Genitourinary: Negative for hematuria.  All other systems reviewed and are negative. Please see the history of present illness.     Vital Signs: Blood pressure 134/72, pulse 66, temperature 98.4 F (36.9 C), temperature source Oral, resp. rate 18, height _0  (1.6 m), weight 213 lb (96.616 kg), SpO2 97 %.   PHYSICAL EXAM: General:  Well nourished, well developed, in no acute distress  HEENT: normal Lymph: no adenopathy Neck: no JVD Endocrine:  No thryomegaly Vascular: No carotid bruits  Cardiac:  normal S1, S2; RRR; no murmur Lungs:  clear to auscultation bilaterally, no wheezing, rhonchi or rales Abd: soft, nontender  Ext: no edema Musculoskeletal:  No deformities  Skin: warm and dry Neuro:  CNs 2-12 intact, no focal abnormalities noted Psych:  Normal affect    EKG:   #1 - Atrial flutter, HR 140, 2:1 AV block, LVH, T-wave inversions 1, 2, 3, aVL, aVF, V3-V6 #2 - NSR, HR 63, LVH, T-wave inversions in leads 1, 2, aVL, aVF, V3-V6 (no significant change when compared to prior tracings)   Labs:   Recent Labs  10/28/14 1921 10/29/14 0052 10/29/14 0822  TROPONINI 0.71* 1.12* 1.35*    Lab Results  Component Value Date   WBC 9.4 10/29/2014   HGB 14.1 10/29/2014   HCT 42.6 10/29/2014   MCV 91.6 10/29/2014   PLT 263 10/29/2014     Recent Labs Lab 10/29/14 0052  NA 140  K 3.7  CL 109  CO2 22  BUN 13  CREATININE 1.22*  CALCIUM 8.8*  GLUCOSE 109*     Radiology/Studies:  Dg Chest 2 View   10/28/2014   IMPRESSION: Mild scarring in the lingula. No edema or consolidation. Stable cardiac prominence.   Electronically Signed   By: Lowella Grip III M.D.   On: 10/28/2014 16:37     ASSESSMENT:  Principal Problem:   Atrial flutter with RVR Active Problems:   NSTEMI (non-ST elevated myocardial infarction)   Paroxysmal atrial fibrillation/flutter   CAD (coronary artery disease)    Chronic diastolic heart failure   Hypothyroidism   Essential hypertension   OSA (obstructive sleep apnea)   Pulmonary hypertension   Chronic renal insufficiency, stage III (moderate)     PLAN:  1.  Atrial Flutter:  Patient presents with Parox AFlutter with RVR.  She was symptomatic with this.  She has had PAF/Flutter in the past and she was taken off of Amiodarone last year b/c concerns with amiodarone toxicity.  She recently saw Roderic Palau, NP in the AFib clinic.  Plan was to see if she would be a candidate for PVI ablation.  Now her cardiac enzymes are trending up ruling her in for NSTEMI.  Will plan on EP (Dr. [name]) seeing her this admit to make further recommendations for rhythm control.    This patients CHA2DS2-VASc Score and unadjusted Ischemic Stroke Rate (% per year) is equal to 4.8 % stroke rate/year from a score of 4 Above score calculated as 1 point each if present [CHF, HTN, DM, Vascular=MI/PAD/Aortic Plaque, Age if 65-74, or Female] Above score calculated as 2 points each if present [Age > 75, or Stroke/TIA/TE]   2.  Non-STEMI:  Troponins are trending up.  This would go against demand ischemia.  Will get echo (already ordered).  She is anticoagulated with Xarelto.  Continue high dose statin.  Continue beta-blocker.  Add ASA 81 mg QD. Will likely need to pursue cath to further evaluate.  Will continue to check serial Troponins. Will ask pharmacy to transition Xarelto to IV Heparin as this will be easier to manage around the time of L heart cath.  Will keep NPO after midnight tonight.  3.  CAD:  LHC in 2013 with non-obstructive disease with 80% lesion in a small Dx branch that was treated medically.  She otherwise had normal coronary arteries.  Now with elevated Troponin c/w NSTEMI.  Rx with IV Heparin, ASA, statin, beta-blocker.  She does note worsening DOE.  Question if this is an anginal equivalent.  Will consider cath. 4.  Chronic Diastolic CHF:  Volume stable.  Continue  Lasix.  Check echo. 5.  Hypertension:  Controlled.  Continue current Rx.  6.  Hyperlipidemia:  Continue statin, Ezetimibe. 7.  Pulmonary HTN:  Felt to be related to OSA after RHC in 2015. 8.  CKD:  Creatinine stable. 9.  OSA:  Continue CPAP.   Will follow with you.   Signed, Richardson Dopp, PA-C  10/29/2014 12:36 PM   Patient seen and examined. I agree with the assessment and plan as detailed above. See also my additional thoughts below.   It seems that the patient's arrhythmia did start before her chest discomfort. The troponin rise is a little higher than we normally see. 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 The question will be whether or not she has closed off her diagonal or has other progressive disease. At this point today, I am in favor of considering both catheterization and consultation for the possibility of atrial fib and atrial flutter ablation during this hospitalization.  Dola Argyle, MD, [facility] 10/29/2014 12:53 PM

## 2014-10-30 ENCOUNTER — Inpatient Hospital Stay (HOSPITAL_COMMUNITY): Payer: Medicare Other

## 2014-10-30 ENCOUNTER — Encounter (HOSPITAL_COMMUNITY): Payer: Self-pay | Admitting: Physician Assistant

## 2014-10-30 DIAGNOSIS — R778 Other specified abnormalities of plasma proteins: Secondary | ICD-10-CM | POA: Diagnosis present

## 2014-10-30 DIAGNOSIS — R7989 Other specified abnormal findings of blood chemistry: Secondary | ICD-10-CM | POA: Diagnosis present

## 2014-10-30 DIAGNOSIS — Z7901 Long term (current) use of anticoagulants: Secondary | ICD-10-CM

## 2014-10-30 DIAGNOSIS — R0600 Dyspnea, unspecified: Secondary | ICD-10-CM

## 2014-10-30 DIAGNOSIS — I422 Other hypertrophic cardiomyopathy: Secondary | ICD-10-CM

## 2014-10-30 DIAGNOSIS — I484 Atypical atrial flutter: Secondary | ICD-10-CM

## 2014-10-30 DIAGNOSIS — I4891 Unspecified atrial fibrillation: Secondary | ICD-10-CM | POA: Diagnosis present

## 2014-10-30 LAB — CBC
HEMATOCRIT: 42 % (ref 36.0–46.0)
Hemoglobin: 13.8 g/dL (ref 12.0–15.0)
MCH: 29.9 pg (ref 26.0–34.0)
MCHC: 32.9 g/dL (ref 30.0–36.0)
MCV: 90.9 fL (ref 78.0–100.0)
Platelets: 256 10*3/uL (ref 150–400)
RBC: 4.62 MIL/uL (ref 3.87–5.11)
RDW: 16.2 % — ABNORMAL HIGH (ref 11.5–15.5)
WBC: 8.5 10*3/uL (ref 4.0–10.5)

## 2014-10-30 LAB — HEMOGLOBIN A1C
HEMOGLOBIN A1C: 6.1 % — AB (ref 4.8–5.6)
MEAN PLASMA GLUCOSE: 128 mg/dL

## 2014-10-30 LAB — HEPARIN LEVEL (UNFRACTIONATED): HEPARIN UNFRACTIONATED: 1.6 [IU]/mL — AB (ref 0.30–0.70)

## 2014-10-30 LAB — APTT
APTT: 99 s — AB (ref 24–37)
aPTT: 111 seconds — ABNORMAL HIGH (ref 24–37)

## 2014-10-30 LAB — PROTIME-INR
INR: 1.06 (ref 0.00–1.49)
Prothrombin Time: 14 seconds (ref 11.6–15.2)

## 2014-10-30 LAB — TROPONIN I
TROPONIN I: 1.03 ng/mL — AB (ref <0.031)
Troponin I: 1.13 ng/mL (ref <0.031)

## 2014-10-30 MED ORDER — PERFLUTREN LIPID MICROSPHERE
1.0000 mL | INTRAVENOUS | Status: AC | PRN
  Administered 2014-10-30: 1 mL via INTRAVENOUS
  Filled 2014-10-30: qty 10

## 2014-10-30 MED ORDER — ASPIRIN 81 MG PO CHEW
81.0000 mg | CHEWABLE_TABLET | ORAL | Status: AC
  Administered 2014-10-31: 81 mg via ORAL
  Filled 2014-10-30: qty 1

## 2014-10-30 MED ORDER — SODIUM CHLORIDE 0.9 % IV SOLN
INTRAVENOUS | Status: DC
  Administered 2014-10-31: 04:00:00 via INTRAVENOUS

## 2014-10-30 MED ORDER — METOPROLOL TARTRATE 25 MG PO TABS
25.0000 mg | ORAL_TABLET | Freq: Two times a day (BID) | ORAL | Status: DC
  Administered 2014-10-30 – 2014-10-31 (×3): 25 mg via ORAL
  Filled 2014-10-30 (×5): qty 1

## 2014-10-30 MED ORDER — ASPIRIN EC 81 MG PO TBEC
81.0000 mg | DELAYED_RELEASE_TABLET | Freq: Every day | ORAL | Status: DC
  Administered 2014-10-30 – 2014-11-01 (×2): 81 mg via ORAL
  Filled 2014-10-30 (×2): qty 1

## 2014-10-30 MED ORDER — DRONEDARONE HCL 400 MG PO TABS
400.0000 mg | ORAL_TABLET | Freq: Two times a day (BID) | ORAL | Status: DC
  Administered 2014-10-31 – 2014-11-01 (×2): 400 mg via ORAL
  Filled 2014-10-30 (×4): qty 1

## 2014-10-30 NOTE — Consult Note (Addendum)
Electrophysiology Consultation Note  Patient ID: Amy [name], MRN: [id number], DOB/AGE: 06/09/1945 69 y.o. Admit date: 10/28/2014   Date of Consult: 10/30/2014 Primary Physician: [name], MD Primary Cardiologist: Dr. Radford Pax EP - Seen by Dr. Caryl Comes in 2013 in consultation, also evaluated by Roderic Palau NP more recently in the Afib clinic  Chief Complaint: palpitations, dyspnea Reason for Consultation: atrial arrhythmias  HPI: [name] is a 69 y/o F with history of HTN, HLD, chronic diastolic CHF, OSA, CAD, COPD, CKD stage III, and paroxysmal atrial fibrillation/paroxysmal atrial flutter whom we are asked to see for recurrence of paroxysmal atrial flutter. CHADS2VASC = 5, on Xarelto.  Per review of notes, LHC in 10/2011 demonstrated non-obstructive disease and an 80% lesion in a small D1 treated medically. She was admitted in 02/2012 for a NSTEMI in the setting of AFib with RVR. This was felt to be demand ischemia. She has had bradycardia in NSR precluding the use of rate controlling medications. She had evidence of apical hypertrophy on Echo at that time and QT prolongation. The only antiarrhythmic option was felt to be amiodarone at that time. She was on this until 05/2013 when she was seen for SOB. [place] 05/2013 showed mild PAH with normal PCWP and RA pressure, could be related to OSA and low oxygen saturation. Treatment of OSA was encouraged. She was evaluated by Dr. Gwenette Greet and it was felt that her SOB may be due to [facility] toxicity with elevated ESR thus amiodarone was discontinued. She had a nuc 10/2013 to rule out ischemia as a contributing factor which was normal. SOB did improve somewhat after coming off amiodarone. She was seen in the ER 06/2014 for palpitations and was found to be in atrial fib vs flutter - EDP gave her flecainide and diltiazem which converted her after discussing case with Dr. Wynonia Lawman. The patient saw Dr. Radford Pax in follow-up who placed a 30-day monitor and  performed a repeat Declo nuc 09/12/14 which was normal without scar or ischemia, EF normal. The 30-day event monitor reportedly showed atrial flutter I have reviewed this and i think it is more consistent with atrial fibrillation . She was referred to the afib clinic 10/23/14. Per Donna's notes, the patient took her metoprolol and converted to SR quickly over a matter of 1-2 hours. Roderic Palau planned to review with Dr. Rayann Heman. In remote consultation, Dr. Caryl Comes did mention possibly seeing Dr. Lehman Prom at [facility] for convergent ablation.Last echo 04/2013: mild LVH, EF 55-60%, mid MR, mod dilated LA (prior echo 2013 showed "mild concentric hypertrophy with associated apicalthickening/hypertrophy giving the appearance of apicalpredominant hypertrophic cardiomyopathy").(44/2.0)  She has not felt previously to be a candidate for standing flecainide due to CAD or Tikosyn due to QT prolongation.  Her husband died in 26-Aug-2022 and over the weekend she was trying to coordinate documents for probate. She presented to [facility] on 10/28/14 with complaints of dyspnea, chest pressure, and palpitations. She was found to be in atrial flutter with HR 140. She spontaneously converted to NSR with improvement in symptoms. However, she does report occasional chest pressure and dyspnea when she exerts herself, relieved with rest, even in the absence of palpitations. She ruled in with peak troponin 1.37. Cr 1.22. TSH high at 5.003. Her Xarelto was held and she was placed on heparin per pharmacy with plans for possible LHC. Metoprolol was increased from 12.22m BID to 273mBID. Unfortunately she was not able to be accomodated on the cath schedule today. EP is consulted for  further recs regarding PAF/PAFL. Sinus rate has been mostly 50s but most recently she has been in the mid-upper 40s. She does endorse rare occasional lightheadedness but no near-syncope or syncope. Reports compliance with Xarelto. No orthopnea, PND, or significant  edema.  Past Medical History  Diagnosis Date  . Hypertension   . Hyperlipidemia   . Hypothyroid   . COPD (chronic obstructive pulmonary disease)   . Dizziness     had PT for being off balance - in approx. 2012  . Lumbar disc disease   . Macular degeneration   . Chronic diastolic heart failure   . Apical variant hypertrophic cardiomyopathy     Diagnosed by echo and ECG 11/13  . GERD (gastroesophageal reflux disease)   . Paroxysmal atrial fibrillation/flutter        . Sinus bradycardia   . CAD (coronary artery disease)     a. LHC in 10/2011 demonstrated non-obstructive disease and an 80% lesion in a small D1 treated medically.   . OSA (obstructive sleep apnea)   . CKD (chronic kidney disease), stage III   . Pulmonary hypertension     a. [facility] 05/2013 showed mild PAH with normal PCWP and RA pressure, could be related to OSA and low oxygen saturation.   . QT prolongation      Surgical History:  Past Surgical History  Procedure Laterality Date  . Cesarean section  1983  . Cardiac catheterization      normal coronary arteries  . Cataract extraction Bilateral   . Right heart catheterization N/A 06/01/2013    Procedure: RIGHT HEART CATH;  Surgeon: Larey Dresser, MD;  Location: [facility] CATH LAB;  Service: Cardiovascular;  Laterality: N/A;     Home Meds: Prior to Admission medications   Medication Sig Start Date End Date Taking? Authorizing Provider  acetaminophen (TYLENOL) 500 MG tablet Take 500 mg by mouth daily as needed (pain). Counteract by Melaleuca   Yes Historical Provider, MD  albuterol (PROVENTIL HFA;VENTOLIN HFA) 108 (90 BASE) MCG/ACT inhaler Inhale 2 puffs into the lungs every 6 (six) hours as needed for wheezing or shortness of breath.    Yes Historical Provider, MD  atorvastatin (LIPITOR) 80 MG tablet Take 1 tablet (80 mg total) by mouth daily. Patient taking differently: Take 80 mg by mouth daily with lunch.  10/23/14  Yes Sherran Needs, NP  Cholecalciferol (VITAMIN D) 2000  UNITS CAPS Take 2,000 Units by mouth daily.    Yes Historical Provider, MD  colchicine 0.6 MG tablet Take 0.6 mg by mouth every other day.    Yes Historical Provider, MD  ezetimibe (ZETIA) 10 MG tablet Take 1 tablet (10 mg total) by mouth daily. Patient taking differently: Take 10 mg by mouth at bedtime.  07/26/14  Yes Sueanne Margarita, MD  furosemide (LASIX) 20 MG tablet Take 1 tablet (20 mg total) by mouth 3 (three) times daily. Patient taking differently: Take 40 mg by mouth See admin instructions. Take 1 tablet (20 mg) every morning, take an additional tablet later in the day if staying at home or if hands are swelling 06/12/14  Yes Sueanne Margarita, MD  levothyroxine (SYNTHROID, LEVOTHROID) 75 MCG tablet Take 75 mcg by mouth daily before breakfast.   Yes Historical Provider, MD  metoprolol tartrate (LOPRESSOR) 25 MG tablet Take 0.5 tablets (12.5 mg total) by mouth 2 (two) times daily. 07/19/14  Yes Sueanne Margarita, MD  nitroGLYCERIN (NITROSTAT) 0.4 MG SL tablet Place 1 tablet (0.4 mg total) under  the tongue every 5 (five) minutes as needed for chest pain. 07/19/14  Yes Traci R Turner, MD  XARELTO 20 MG TABS tablet TAKE 1 TABLET BY MOUTH EVERY DAY PER DR TURNER Patient taking differently: TAKE 1 TABLET BY MOUTH DAILY AT BEDTIME (PER DR TURNER) 08/21/14  Yes Sueanne Margarita, MD  zolpidem (AMBIEN) 5 MG tablet Take 2.5 mg by mouth at bedtime as needed for sleep.    Yes Historical Provider, MD    Inpatient Medications:  . atorvastatin  80 mg Oral Q lunch  . cholecalciferol  2,000 Units Oral Daily  . colchicine  0.6 mg Oral QODAY  . docusate sodium  100 mg Oral BID  . ezetimibe  10 mg Oral Daily  . furosemide  20 mg Oral BID  . levothyroxine  75 mcg Oral QAC breakfast  . metoprolol tartrate  25 mg Oral BID  . sodium chloride  3 mL Intravenous Q12H  . sodium chloride  3 mL Intravenous Q12H   . heparin 1,000 Units/hr (10/30/14 0200)    Allergies:  Allergies  Allergen Reactions  . Zyban [Bupropion]  Itching    History   Social History  . Marital Status: Married    Spouse Name: divorced.  . Number of Children: 1  . Years of Education: N/A   Occupational History  . retired. still works for [employer].     Social History Main Topics  . Smoking status: Former Smoker -- 0.30 packs/day for 44 years    Types: Cigarettes    Quit date: 04/07/2009  . Smokeless tobacco: Never Used  . Alcohol Use: Yes     Comment: 2 to 3 glasses wine per week  . Drug Use: No  . Sexual Activity: No   Other Topics Concern  . Not on file   Social History Narrative   Pt lives alone with 2 dogs.      Family History  Problem Relation Age of Onset  . Heart disease Sister     Good Pastures Syndrome  . Gout Father   . Lung cancer Father   . Breast cancer Paternal Aunt      Review of Systems: She reports occasional trace pedal edema. No orthopnea, PND. All other systems reviewed and are otherwise negative except as noted above.  Labs:  Recent Labs  10/29/14 1347 10/29/14 1758 10/30/14 0016 10/30/14 0625  TROPONINI 1.33* 1.37* 1.13* 1.03*   Lab Results  Component Value Date   WBC 8.5 10/30/2014   HGB 13.8 10/30/2014   HCT 42.0 10/30/2014   MCV 90.9 10/30/2014   PLT 256 10/30/2014    Recent Labs Lab 10/29/14 0052  NA 140  K 3.7  CL 109  CO2 22  BUN 13  CREATININE 1.22*  CALCIUM 8.8*  GLUCOSE 109*   Lab Results  Component Value Date   CHOL 137 09/27/2014   HDL 49.50 09/27/2014   LDLCALC 72 09/27/2014   TRIG 76.0 09/27/2014   Lab Results  Component Value Date   DDIMER 0.42 04/24/2013    Radiology/Studies:  Dg Chest 2 View  10/28/2014   CLINICAL DATA:  Shortness of breath with chest pressure  EXAM: CHEST  2 VIEW  COMPARISON:  June 25, 2014  FINDINGS: There is mild scarring in the lingula, stable. Lungs elsewhere clear. Heart is borderline enlarged with pulmonary vascularity within normal limits. No adenopathy. No bone lesions.  IMPRESSION: Mild scarring in the lingula. No  edema or consolidation. Stable cardiac prominence.   Electronically Signed  By: Lowella Grip III M.D.   On: 10/28/2014 16:37    EKG: 1) probable atrial flutter 140bpm, LVH with repol abnormalities, QTc 565 2) NSR 63bpm LVH with repol abnormalities QTc 431m-460  one PVC   Physical Exam: Blood pressure 144/79, pulse 56, temperature 97.7 F (36.5 C), temperature source Oral, resp. rate 20, height 5' 3" (1.6 m), weight 213 lb (96.616 kg), SpO2 98 %. General: Well developed, well nourished AAF in no acute distress. Head: Normocephalic, atraumatic, sclera non-icteric, no xanthomas, nares are without discharge.  Neck: Negative for carotid bruits. JVD not elevated. Lungs: Somewhat diminished throught but no wheezes, rales, or rhonchi. Breathing is unlabored. Heart: RRR with S1 S2. No murmurs, rubs, or gallops appreciated. Abdomen: Soft, non-tender, non-distended with normoactive bowel sounds. No hepatomegaly. No rebound/guarding. No obvious abdominal masses. Msk:  Strength and tone appear normal for age. Extremities: No clubbing or cyanosis. No edema.  Distal pedal pulses are 2+ and equal bilaterally. Neuro: Alert and oriented X 3. No facial asymmetry. No focal deficit. Moves all extremities spontaneously. Psych:  Responds to questions appropriately with a normal affect.    Assessment and Plan:   1. Recurrent paroxysmal atrial flutter 2. H/o paroxysmal atrial fibrillation 3. Sinus bradycardia - HR currently in the 40s 3. H/o chronic diastolic CHF with h/o apical predominant hypertrophic cardiomyopathy, appearing euvolemic 4. Hypothyroidism with abnormal TSH, free T4 5. NSTEMI with history of CAD, previously managed medically 6. CKD stage III  Will discuss plan for treatment of atrial arrhythmias with. Dr. KCaryl Comes Amiodarone stopped in the past due to questionable toxicity. Not a candidate for Tikosyn with QT prolongation. Not a candidate for 1C agents due to history of CAD, apical  hypertrophy. HR has come down to the 40's at times even with gentle titration of her BB. May need to consider pacer so that we can treat higher rates without exacerbating bradycardia further. Recheck TSH and free T4 given abnormal TSH.  Also, I agree with consideration for cath this admission to exclude progression of disease in other vessels. She does report exertional chest pressure and dyspnea even in the absence of palpitations. Would gently hydrate the AM of cath given renal insufficiency. No evidence of CHF on exam today. Will review with MD.  SRudean Hitt Dunn PA-C 10/30/2014, 11:22 AM   The patient has had recurrent atrial fibrillation and atrial flutter that is associated with significant shortness of breath and positive troponin. Antiarrhythmics drug options are very limited by QT prolongation, coronary disease, left ventricular hypertrophy and amiodarone lung toxicity. I will start her on dronaderone for the short-term.  Interestingly, she can tell the difference between the irregular tachycardia documented in Early July and the regular tachycardia with which she presented  She has an appointment in early August to see Dr. JGreggory Brandyfor consideration of ablation. In this regard reassessment of left atrial dimension is important. Furthermore, kind understand her cardiac structure, i.e. that she had HCM, has important implications related to procedural success as well as family care issues.  She has had echocardiograms in the last 3 years red very differently as relates to apical hypertrophy. We will arrange for cardiac MRI which can be done without gadolinium to assess features for HCM

## 2014-10-30 NOTE — Progress Notes (Signed)
[place] for heparin Indication: chest pain/ACS  Allergies  Allergen Reactions  . Zyban [Bupropion] Itching    Patient Measurements: Height: _0  (160 cm) Weight: 213 lb (96.616 kg) IBW/kg (Calculated) : 52.4 Heparin Dosing Weight: 74kg  Vital Signs: Temp: 98.8 F (37.1 C) (07/25 0250) Temp Source: Oral (07/25 0250) BP: 119/65 mmHg (07/25 0250) Pulse Rate: 52 (07/25 0250)  Labs:  Recent Labs  10/28/14 1608  10/29/14 0052  10/29/14 1347 10/29/14 1758 10/30/14 0016 10/30/14 0435  HGB 14.2  --  14.1  --   --   --   --   --   HCT 43.3  --  42.6  --   --   --   --   --   PLT 263  --  263  --   --   --   --   --   APTT  --   --   --   --  50*  --   --  111*  HEPARINUNFRC  --   --   --   --  >2.20*  --   --  1.60*  CREATININE 1.15*  --  1.22*  --   --   --   --   --   TROPONINI  --   < > 1.12*  < > 1.33* 1.37* 1.13*  --   < > = values in this interval not displayed.  Estimated Creatinine Clearance: 48.2 mL/min (by C-G formula based on Cr of 1.22).  Assessment: 69 yo female with NSTEMI, h/o Afib and Xarelto on hold, for heparin.   Goal of Therapy:  Heparin level= 0.3-0.7 APTT 66-102 Monitor platelets by anticoagulation protocol: Yes   Plan:   Decrease Heparin 900 units/hr Check aPTT in 8 hours.  Phillis Knack, PharmD, BCPS   10/30/2014 5:51 AM

## 2014-10-30 NOTE — Progress Notes (Addendum)
[facility] PROGRESS NOTE  Assessment/Plan: Chest pain: - She has remained chest pain-free, cardiology consulted. She's on IV heparin. She continues to be on aspirin, beta blockers and statins. Awaiting EP recommendations. Currently on IV heparin.  Atrial fibrillation with RVR/  Paroxysmal atrial fibrillation/flutter: - now rate controlled and Sinus rhtythm. - held Xarelto, as IV heparin was started. - EP consulted  Chronic diastolic heart failure - Euvolemic cont current therapy.  Hypothyroidism - mildly elevated repeat TSH in 6 weeks.    Code Status: full Family Communication: noen  Disposition Plan: observation   Consultants:  cardiology  Procedures:  Echo  Antibiotics:  None  HPI/Subjective: Chest pain and SOB free.  Objective: Filed Vitals:   10/29/14 2149 10/29/14 2300 10/30/14 0250 10/30/14 0815  BP: 126/83 121/49 119/65 136/90  Pulse: 64 52 52 51  Temp:   98.8 F (37.1 C) 97.7 F (36.5 C)  TempSrc:   Oral Oral  Resp:   18 20  Height:      Weight:      SpO2:  96% 98% 98%    Intake/Output Summary (Last 24 hours) at 10/30/14 0856 Last data filed at 10/30/14 0200  Gross per 24 hour  Intake    288 ml  Output   2075 ml  Net  -1787 ml   Filed Weights   10/28/14 1552 10/29/14 0617  Weight: 95.255 kg (210 lb) 96.616 kg (213 lb)    Exam:  General: Alert, awake, oriented x3, in no acute distress.  HEENT: No bruits, no goiter.  Heart: Regular rate and rhythm. Lungs: Good air movement, clear Abdomen: Soft, nontender, nondistended, positive bowel sounds.  Neuro: Grossly intact, nonfocal.   Data Reviewed: Basic Metabolic Panel:  Recent Labs Lab 10/28/14 1608 10/29/14 0052  NA 138 140  K 3.9 3.7  CL 109 109  CO2 18* 22  GLUCOSE 90 109*  BUN 11 13  CREATININE 1.15* 1.22*  CALCIUM 8.7* 8.8*   Liver Function Tests: No results for input(s): AST, ALT, ALKPHOS, BILITOT, PROT, ALBUMIN in the last 168 hours. No results for  input(s): LIPASE, AMYLASE in the last 168 hours. No results for input(s): AMMONIA in the last 168 hours. CBC:  Recent Labs Lab 10/28/14 1608 10/29/14 0052 10/30/14 0625  WBC 8.0 9.4 8.5  HGB 14.2 14.1 13.8  HCT 43.3 42.6 42.0  MCV 90.2 91.6 90.9  PLT 263 263 256   Cardiac Enzymes:  Recent Labs Lab 10/29/14 0822 10/29/14 1347 10/29/14 1758 10/30/14 0016 10/30/14 0625  TROPONINI 1.35* 1.33* 1.37* 1.13* 1.03*   BNP (last 3 results)  Recent Labs  06/25/14 1535  BNP 309.9*    ProBNP (last 3 results)  Recent Labs  04/12/14 1423 04/21/14 1016  PROBNP 156.0* 86.0    CBG: No results for input(s): GLUCAP in the last 168 hours.  No results found for this or any previous visit (from the past 240 hour(s)).   Studies: Dg Chest 2 View  10/28/2014   CLINICAL DATA:  Shortness of breath with chest pressure  EXAM: CHEST  2 VIEW  COMPARISON:  June 25, 2014  FINDINGS: There is mild scarring in the lingula, stable. Lungs elsewhere clear. Heart is borderline enlarged with pulmonary vascularity within normal limits. No adenopathy. No bone lesions.  IMPRESSION: Mild scarring in the lingula. No edema or consolidation. Stable cardiac prominence.   Electronically Signed   By: Lowella Grip III M.D.   On: 10/28/2014 16:37    Scheduled Meds: . atorvastatin  80 mg Oral Q lunch  . cholecalciferol  2,000 Units Oral Daily  . colchicine  0.6 mg Oral QODAY  . docusate sodium  100 mg Oral BID  . ezetimibe  10 mg Oral Daily  . furosemide  20 mg Oral BID  . levothyroxine  75 mcg Oral QAC breakfast  . metoprolol tartrate  25 mg Oral BID  . sodium chloride  3 mL Intravenous Q12H  . sodium chloride  3 mL Intravenous Q12H   Continuous Infusions: . heparin 1,000 Units/hr (10/30/14 0200)    Time Spent: 25 min   FELIZ Marguarite Arbour  [facility] Pager [phone]. If 7PM-7AM, please contact night-coverage at [email or web address], password [facility] 10/30/2014, 8:56 AM  LOS: 1 day

## 2014-10-30 NOTE — Progress Notes (Signed)
Subjective:  No chest pain this am- in NSR/SB  Objective:  Vital Signs in the last 24 hours: Temp:  [98 F (36.7 C)-98.8 F (37.1 C)] 98.8 F (37.1 C) (07/25 0250) Pulse Rate:  [52-64] 52 (07/25 0250) Resp:  [16-18] 18 (07/25 0250) BP: (119-136)/(49-83) 119/65 mmHg (07/25 0250) SpO2:  [96 %-100 %] 98 % (07/25 0250)  Intake/Output from previous day:  Intake/Output Summary (Last 24 hours) at 10/30/14 0981 Last data filed at 10/30/14 0200  Gross per 24 hour  Intake    288 ml  Output   2075 ml  Net  -1787 ml    Physical Exam: General appearance: alert, cooperative, no distress and morbidly obese Lungs: clear to auscultation bilaterally Heart: regular rate and rhythm Abdomen: obese, non tender Skin: Skin color, texture, turgor normal. No rashes or lesions Neurologic: Grossly normal   Rate: 45  Rhythm: normal sinus rhythm and sinus bradycardia  Lab Results:  Recent Labs  10/29/14 0052 10/30/14 0625  WBC 9.4 8.5  HGB 14.1 13.8  PLT 263 256    Recent Labs  10/28/14 1608 10/29/14 0052  NA 138 140  K 3.9 3.7  CL 109 109  CO2 18* 22  GLUCOSE 90 109*  BUN 11 13  CREATININE 1.15* 1.22*    Recent Labs  10/30/14 0016 10/30/14 0625  TROPONINI 1.13* 1.03*   No results for input(s): INR in the last 72 hours.  Scheduled Meds: . atorvastatin  80 mg Oral Q lunch  . cholecalciferol  2,000 Units Oral Daily  . colchicine  0.6 mg Oral QODAY  . docusate sodium  100 mg Oral BID  . ezetimibe  10 mg Oral Daily  . furosemide  20 mg Oral BID  . levothyroxine  75 mcg Oral QAC breakfast  . metoprolol tartrate  25 mg Oral BID  . sodium chloride  3 mL Intravenous Q12H  . sodium chloride  3 mL Intravenous Q12H   Continuous Infusions: . heparin 1,000 Units/hr (10/30/14 0200)   PRN Meds:.sodium chloride, acetaminophen **OR** acetaminophen, metoprolol, nitroGLYCERIN, ondansetron **OR** ondansetron (ZOFRAN) IV, sodium chloride, zolpidem   Imaging: Dg Chest 2  View  10/28/2014   CLINICAL DATA:  Shortness of breath with chest pressure  EXAM: CHEST  2 VIEW  COMPARISON:  June 25, 2014  FINDINGS: There is mild scarring in the lingula, stable. Lungs elsewhere clear. Heart is borderline enlarged with pulmonary vascularity within normal limits. No adenopathy. No bone lesions.  IMPRESSION: Mild scarring in the lingula. No edema or consolidation. Stable cardiac prominence.   Electronically Signed   By: Lowella Grip III M.D.   On: 10/28/2014 16:37     Assessment/Plan:  69 y.o. Obese, AA female with recurrent PAF with RVR and associated demand ischemia NSTEMI. Taken off Amiodarone in the past secondary to DOE and pulmonary HTN and not a candidate for 1C drugs.   Principal Problem:   Atrial flutter with RVR Active Problems:   NSTEMI (non-ST elevated myocardial infarction)   Essential hypertension   Chronic diastolic heart failure   Paroxysmal atrial fibrillation/flutter   Chronic anticoagulation   CAD (coronary artery disease)   Chronic renal insufficiency, stage III (moderate)   Hypothyroidism   OSA (obstructive sleep apnea)   Pulmonary hypertension   Obesity-BMI 38   PLAN: Not on schedule for cath today and cannot be added on. Will cancel NPO and ask EP to see today. Not sure she will be able to tolerate increased Lopressor dose secondary to  bradycardia. Xarelto on hold, pt on Heparin. Consider cath tomorrow if indicated- will discuss with MD.   Kerin Ransom PA-C 10/30/2014, 8:12 AM [phone]  I have examined the patient and reviewed assessment and plan and discussed with patient.  Agree with above as stated.  Troponin elevated in the setting of AFIb with RVR.  She has had this scenario before.  SHe had a diagonal kesion in the past and a recent low risk stress test.  Not sure that cath is needed since she is asymptomatic when she is in NSR.   Rx for AFib may prevent further elevated troponin.  Will see if cath is required from an EP standpoint  before considering procedure in the future.  Conleigh Heinlein S.

## 2014-10-30 NOTE — Progress Notes (Signed)
  Echocardiogram 2D Echocardiogram has been performed.  Amy [name] 10/30/2014, 5:14 PM

## 2014-10-30 NOTE — Discharge Instructions (Signed)
° °

## 2014-10-30 NOTE — Progress Notes (Signed)
ANTICOAGULATION CONSULT NOTE - Follow Up Consult  Pharmacy Consult for heparin Indication: chest pain/ACS  Allergies  Allergen Reactions  . Zyban [Bupropion] Itching    Patient Measurements: Height: _0  (160 cm) Weight: 213 lb (96.616 kg) IBW/kg (Calculated) : 52.4 Heparin Dosing Weight: 74 kg  Vital Signs: Temp: 97.4 F (36.3 C) (07/25 1335) Temp Source: Oral (07/25 1335) BP: 111/61 mmHg (07/25 1335) Pulse Rate: 52 (07/25 1335)  Labs:  Recent Labs  10/28/14 1608  10/29/14 0052  10/29/14 1347 10/29/14 1758 10/30/14 0016 10/30/14 0435 10/30/14 0625 10/30/14 1223  HGB 14.2  --  14.1  --   --   --   --   --  13.8  --   HCT 43.3  --  42.6  --   --   --   --   --  42.0  --   PLT 263  --  263  --   --   --   --   --  256  --   APTT  --   --   --   --  50*  --   --  111*  --  99*  HEPARINUNFRC  --   --   --   --  >2.20*  --   --  1.60*  --   --   CREATININE 1.15*  --  1.22*  --   --   --   --   --   --   --   TROPONINI  --   < > 1.12*  < > 1.33* 1.37* 1.13*  --  1.03*  --   < > = values in this interval not displayed.  Estimated Creatinine Clearance: 48.2 mL/min (by C-G formula based on Cr of 1.22).   Assessment: 69 yo female with NSTEMI, h/o Afib. On Xarelto PTA, now on heparin. CBC stable, no bleeding reported. aPTT= 0.99 (drawn 1.5hr early), therapeutic but on high end of the range will decrease rate slightly.  Goal of Therapy:  Heparin level 0.3-0.7 units/ml aPTT 66-102 seconds Monitor platelets by anticoagulation protocol: Yes   Plan:  Decrease Heparin 850 units/hr  F/u aPTT, HL, CBC with daily labs in AM. Monitor for s/sx of bleeding.  Dimitri Ped, PharmD. Clinical Pharmacist Resident Pager: [phone]

## 2014-10-31 ENCOUNTER — Encounter (HOSPITAL_COMMUNITY)
Admission: EM | Disposition: A | Discharge status: Home / Self Care | Payer: Self-pay | Source: Home / Self Care | Attending: Internal Medicine

## 2014-10-31 ENCOUNTER — Inpatient Hospital Stay (HOSPITAL_COMMUNITY): Payer: Medicare Other

## 2014-10-31 ENCOUNTER — Encounter (HOSPITAL_COMMUNITY): Payer: Self-pay | Admitting: Cardiology

## 2014-10-31 DIAGNOSIS — I4892 Unspecified atrial flutter: Secondary | ICD-10-CM | POA: Insufficient documentation

## 2014-10-31 DIAGNOSIS — I422 Other hypertrophic cardiomyopathy: Secondary | ICD-10-CM

## 2014-10-31 DIAGNOSIS — I2510 Atherosclerotic heart disease of native coronary artery without angina pectoris: Secondary | ICD-10-CM

## 2014-10-31 HISTORY — PX: CARDIAC CATHETERIZATION: SHX172

## 2014-10-31 LAB — TSH: TSH: 7.828 u[IU]/mL — ABNORMAL HIGH (ref 0.350–4.500)

## 2014-10-31 LAB — BASIC METABOLIC PANEL
ANION GAP: 10 (ref 5–15)
BUN: 17 mg/dL (ref 6–20)
CO2: 22 mmol/L (ref 22–32)
Calcium: 8.9 mg/dL (ref 8.9–10.3)
Chloride: 104 mmol/L (ref 101–111)
Creatinine, Ser: 1.18 mg/dL — ABNORMAL HIGH (ref 0.44–1.00)
GFR calc Af Amer: 53 mL/min — ABNORMAL LOW (ref >60)
GFR, EST NON AFRICAN AMERICAN: 46 mL/min — AB (ref >60)
GLUCOSE: 100 mg/dL — AB (ref 65–99)
Potassium: 3.8 mmol/L (ref 3.5–5.1)
Sodium: 136 mmol/L (ref 135–145)

## 2014-10-31 LAB — CBC
HEMATOCRIT: 43.6 % (ref 36.0–46.0)
Hemoglobin: 14.5 g/dL (ref 12.0–15.0)
MCH: 30 pg (ref 26.0–34.0)
MCHC: 33.3 g/dL (ref 30.0–36.0)
MCV: 90.3 fL (ref 78.0–100.0)
PLATELETS: 273 10*3/uL (ref 150–400)
RBC: 4.83 MIL/uL (ref 3.87–5.11)
RDW: 16 % — ABNORMAL HIGH (ref 11.5–15.5)
WBC: 9.5 10*3/uL (ref 4.0–10.5)

## 2014-10-31 LAB — HEPARIN LEVEL (UNFRACTIONATED): Heparin Unfractionated: 0.52 IU/mL (ref 0.30–0.70)

## 2014-10-31 LAB — APTT: APTT: 83 s — AB (ref 24–37)

## 2014-10-31 LAB — T4, FREE: Free T4: 0.83 ng/dL (ref 0.61–1.12)

## 2014-10-31 SURGERY — LEFT HEART CATH AND CORONARY ANGIOGRAPHY

## 2014-10-31 MED ORDER — NITROGLYCERIN 1 MG/10 ML FOR IR/CATH LAB
INTRA_ARTERIAL | Status: DC | PRN
  Administered 2014-10-31: 5 mL

## 2014-10-31 MED ORDER — SODIUM CHLORIDE 0.9 % IJ SOLN: 3.0000 mL | INTRAMUSCULAR | Status: DC | PRN

## 2014-10-31 MED ORDER — HEPARIN (PORCINE) IN NACL 2-0.9 UNIT/ML-% IJ SOLN
INTRAMUSCULAR | Status: AC
  Filled 2014-10-31: qty 1000

## 2014-10-31 MED ORDER — FENTANYL CITRATE (PF) 100 MCG/2ML IJ SOLN
INTRAMUSCULAR | Status: AC
  Filled 2014-10-31: qty 2

## 2014-10-31 MED ORDER — FENTANYL CITRATE (PF) 100 MCG/2ML IJ SOLN
INTRAMUSCULAR | Status: DC | PRN
  Administered 2014-10-31: 25 ug via INTRAVENOUS

## 2014-10-31 MED ORDER — IOHEXOL 350 MG/ML SOLN
INTRAVENOUS | Status: DC | PRN
  Administered 2014-10-31: 70 mL via INTRA_ARTERIAL

## 2014-10-31 MED ORDER — MIDAZOLAM HCL 2 MG/2ML IJ SOLN
INTRAMUSCULAR | Status: AC
  Filled 2014-10-31: qty 2

## 2014-10-31 MED ORDER — GADOBENATE DIMEGLUMINE 529 MG/ML IV SOLN
27.0000 mL | Freq: Once | INTRAVENOUS | Status: AC | PRN
  Administered 2014-10-31: 27 mL via INTRAVENOUS

## 2014-10-31 MED ORDER — RIVAROXABAN 20 MG PO TABS
20.0000 mg | ORAL_TABLET | Freq: Every day | ORAL | Status: DC
  Administered 2014-10-31: 20 mg via ORAL
  Filled 2014-10-31: qty 1

## 2014-10-31 MED ORDER — SODIUM CHLORIDE 0.9 % IJ SOLN: 3.0000 mL | Freq: Two times a day (BID) | INTRAMUSCULAR | Status: DC

## 2014-10-31 MED ORDER — SODIUM CHLORIDE 0.9 % WEIGHT BASED INFUSION
3.0000 mL/kg/h | INTRAVENOUS | Status: AC
  Administered 2014-10-31 (×2): 3 mL/kg/h via INTRAVENOUS

## 2014-10-31 MED ORDER — NITROGLYCERIN 1 MG/10 ML FOR IR/CATH LAB
INTRA_ARTERIAL | Status: AC
  Filled 2014-10-31: qty 10

## 2014-10-31 MED ORDER — HEPARIN SODIUM (PORCINE) 1000 UNIT/ML IJ SOLN
INTRAMUSCULAR | Status: DC | PRN
  Administered 2014-10-31: 5000 [IU] via INTRAVENOUS

## 2014-10-31 MED ORDER — SODIUM CHLORIDE 0.9 % IV SOLN: 250.0000 mL | INTRAVENOUS | Status: DC | PRN

## 2014-10-31 MED ORDER — LIDOCAINE HCL (PF) 1 % IJ SOLN
INTRAMUSCULAR | Status: AC
  Filled 2014-10-31: qty 30

## 2014-10-31 MED ORDER — RADIAL COCKTAIL (HEPARIN/VERAPAMIL/LIDOCAINE/NITRO)
Status: DC | PRN
  Administered 2014-10-31: 10 via INTRA_ARTERIAL

## 2014-10-31 MED ORDER — RIVAROXABAN 20 MG PO TABS: 20.0000 mg | ORAL_TABLET | Freq: Every day | ORAL | Status: DC

## 2014-10-31 MED ORDER — MIDAZOLAM HCL 2 MG/2ML IJ SOLN
INTRAMUSCULAR | Status: DC | PRN
  Administered 2014-10-31: 2 mg via INTRAVENOUS

## 2014-10-31 MED ORDER — HEPARIN SODIUM (PORCINE) 1000 UNIT/ML IJ SOLN
INTRAMUSCULAR | Status: AC
  Filled 2014-10-31: qty 1

## 2014-10-31 MED ORDER — VERAPAMIL HCL 2.5 MG/ML IV SOLN
INTRAVENOUS | Status: AC
  Filled 2014-10-31: qty 2

## 2014-10-31 SURGICAL SUPPLY — 16 items
CATH INFINITI 5 FR JL3.5 (CATHETERS) ×3 IMPLANT
CATH INFINITI 5FR MULTPACK ANG (CATHETERS) IMPLANT
CATH INFINITI JR4 5F (CATHETERS) ×3 IMPLANT
CATH OPTITORQUE TIG 4.0 5F (CATHETERS) IMPLANT
COVER PRB 48X5XTLSCP FOLD TPE (BAG) IMPLANT
COVER PROBE 5X48 (BAG)
DEVICE RAD COMP TR BAND LRG (VASCULAR PRODUCTS) ×3 IMPLANT
GLIDESHEATH SLEND A-KIT 6F 22G (SHEATH) ×3 IMPLANT
KIT HEART LEFT (KITS) ×3 IMPLANT
PACK CARDIAC CATHETERIZATION (CUSTOM PROCEDURE TRAY) ×3 IMPLANT
SHEATH PINNACLE 5F 10CM (SHEATH) IMPLANT
SYR MEDRAD MARK V 150ML (SYRINGE) IMPLANT
TRANSDUCER W/STOPCOCK (MISCELLANEOUS) ×3 IMPLANT
TUBING CIL FLEX 10 FLL-RA (TUBING) ×3 IMPLANT
WIRE EMERALD 3MM-J .035X150CM (WIRE) ×3 IMPLANT
WIRE SAFE-T 1.5MM-J .035X260CM (WIRE) ×3 IMPLANT

## 2014-10-31 NOTE — Progress Notes (Signed)
Pt's HR dipped into the 30s x3. HR has not gone below 37. Pt is sleeping; HR has been sinus brady in the 40s-50s. Notified on call cardiologist. Will continue to monitor.

## 2014-10-31 NOTE — H&P (View-Only) (Signed)
 Electrophysiology Consultation Note  Patient ID: Amy [name], MRN: [id number], DOB/AGE: 69/16/1947 69 y.o. Admit date: 10/28/2014   Date of Consult: 10/30/2014 Primary Physician: RANKINS,VICTORIA, MD Primary Cardiologist: Amy [name] EP - Seen by Amy [name] in 2013 in consultation, also evaluated by Amy [name] more recently in the Afib clinic  Chief Complaint: palpitations, dyspnea Reason for Consultation: atrial arrhythmias  HPI: Amy [name] is a 69 y/o F with history of HTN, HLD, chronic diastolic CHF, OSA, CAD, COPD, CKD stage III, and paroxysmal atrial fibrillation/paroxysmal atrial flutter whom we are asked to see for recurrence of paroxysmal atrial flutter. CHADS2VASC = 5, on Xarelto.  Per review of notes, LHC in 10/2011 demonstrated non-obstructive disease and an 80% lesion in a small D1 treated medically. She was admitted in 02/2012 for a NSTEMI in the setting of AFib with RVR. This was felt to be demand ischemia. She has had bradycardia in NSR precluding the use of rate controlling medications. She had evidence of apical hypertrophy on Echo at that time and QT prolongation. The only antiarrhythmic option was felt to be amiodarone at that time. She was on this until 05/2013 when she was seen for SOB. RHC 05/2013 showed mild PAH with normal PCWP and RA pressure, could be related to OSA and low oxygen saturation. Treatment of OSA was encouraged. She was evaluated by Amy [name] and it was felt that her SOB may be due to amio toxicity with elevated ESR thus amiodarone was discontinued. She had a nuc 10/2013 to rule out ischemia as a contributing factor which was normal. SOB did improve somewhat after coming off amiodarone. She was seen in the ER 06/2014 for palpitations and was found to be in atrial fib vs flutter - EDP gave her flecainide and diltiazem which converted her after discussing case with Amy [name]. The patient saw Amy [name] in follow-up who placed a 30-day monitor and  performed a repeat Lexiscan nuc 09/12/14 which was normal without scar or ischemia, EF normal. The 30-day event monitor reportedly showed atrial flutter I have reviewed this and i think it is more consistent with atrial fibrillation . She was referred to the afib clinic 10/23/14. Per Amy's notes, the patient took her metoprolol and converted to SR quickly over a matter of 1-2 hours. Amy [name] planned to review with Amy [name]. In remote consultation, Amy [name] did mention possibly seeing Amy [name] at [facility] for convergent ablation.Last echo 04/2013: mild LVH, EF 55-60%, mid MR, mod dilated LA (prior echo 2013 showed "mild concentric hypertrophy with associated apicalthickening/hypertrophy giving the appearance of apicalpredominant hypertrophic cardiomyopathy").(44/2.0)  She has not felt previously to be a candidate for standing flecainide due to CAD or Tikosyn due to QT prolongation.  Her husband died in April and over the weekend she was trying to coordinate documents for probate. She presented to [name] on 10/28/14 with complaints of dyspnea, chest pressure, and palpitations. She was found to be in atrial flutter with HR 140. She spontaneously converted to NSR with improvement in symptoms. However, she does report occasional chest pressure and dyspnea when she exerts herself, relieved with rest, even in the absence of palpitations. She ruled in with peak troponin 1.37. Cr 1.22. TSH high at 5.003. Her Xarelto was held and she was placed on heparin per pharmacy with plans for possible LHC. Metoprolol was increased from 12.5mg BID to 25mg BID. Unfortunately she was not able to be accomodated on the cath schedule today. EP is consulted for   further recs regarding PAF/PAFL. Sinus rate has been mostly 50s but most recently she has been in the mid-upper 40s. She does endorse rare occasional lightheadedness but no near-syncope or syncope. Reports compliance with Xarelto. No orthopnea, PND, or significant  edema.  Past Medical History  Diagnosis Date  . Hypertension   . Hyperlipidemia   . Hypothyroid   . COPD (chronic obstructive pulmonary disease)   . Dizziness     had PT for being off balance - in approx. 2012  . Lumbar disc disease   . Macular degeneration   . Chronic diastolic heart failure   . Apical variant hypertrophic cardiomyopathy     Diagnosed by echo and ECG 11/13  . GERD (gastroesophageal reflux disease)   . Paroxysmal atrial fibrillation/flutter        . Sinus bradycardia   . CAD (coronary artery disease)     a. LHC in 10/2011 demonstrated non-obstructive disease and an 80% lesion in a small D1 treated medically.   . OSA (obstructive sleep apnea)   . CKD (chronic kidney disease), stage III   . Pulmonary hypertension     a. RHC 05/2013 showed mild PAH with normal PCWP and RA pressure, could be related to OSA and low oxygen saturation.   . QT prolongation      Surgical History:  Past Surgical History  Procedure Laterality Date  . Cesarean section  1983  . Cardiac catheterization      normal coronary arteries  . Cataract extraction Bilateral   . Right heart catheterization N/A 06/01/2013    Procedure: RIGHT HEART CATH;  Surgeon: Larey Dresser, MD;  Location: [facility] CATH LAB;  Service: Cardiovascular;  Laterality: N/A;     Home Meds: Prior to Admission medications   Medication Sig Start Date End Date Taking? Authorizing Provider  acetaminophen (TYLENOL) 500 MG tablet Take 500 mg by mouth daily as needed (pain). Counteract by Melaleuca   Yes Historical Provider, MD  albuterol (PROVENTIL HFA;VENTOLIN HFA) 108 (90 BASE) MCG/ACT inhaler Inhale 2 puffs into the lungs every 6 (six) hours as needed for wheezing or shortness of breath.    Yes Historical Provider, MD  atorvastatin (LIPITOR) 80 MG tablet Take 1 tablet (80 mg total) by mouth daily. Patient taking differently: Take 80 mg by mouth daily with lunch.  10/23/14  Yes Sherran Needs, [name]  Cholecalciferol (VITAMIN D) 2000  UNITS CAPS Take 2,000 Units by mouth daily.    Yes Historical Provider, MD  colchicine 0.6 MG tablet Take 0.6 mg by mouth every other day.    Yes Historical Provider, MD  ezetimibe (ZETIA) 10 MG tablet Take 1 tablet (10 mg total) by mouth daily. Patient taking differently: Take 10 mg by mouth at bedtime.  07/26/14  Yes Sueanne Margarita, MD  furosemide (LASIX) 20 MG tablet Take 1 tablet (20 mg total) by mouth 3 (three) times daily. Patient taking differently: Take 40 mg by mouth See admin instructions. Take 1 tablet (20 mg) every morning, take an additional tablet later in the day if staying at home or if hands are swelling 06/12/14  Yes Sueanne Margarita, MD  levothyroxine (SYNTHROID, LEVOTHROID) 75 MCG tablet Take 75 mcg by mouth daily before breakfast.   Yes Historical Provider, MD  metoprolol tartrate (LOPRESSOR) 25 MG tablet Take 0.5 tablets (12.5 mg total) by mouth 2 (two) times daily. 07/19/14  Yes Sueanne Margarita, MD  nitroGLYCERIN (NITROSTAT) 0.4 MG SL tablet Place 1 tablet (0.4 mg total) under  the tongue every 5 (five) minutes as needed for chest pain. 07/19/14  Yes Traci R Turner, MD  XARELTO 20 MG TABS tablet TAKE 1 TABLET BY MOUTH EVERY DAY PER DR [name] Patient taking differently: TAKE 1 TABLET BY MOUTH DAILY AT BEDTIME (PER DR [name]) 08/21/14  Yes Traci R Turner, MD  zolpidem (AMBIEN) 5 MG tablet Take 2.5 mg by mouth at bedtime as needed for sleep.    Yes Historical Provider, MD    Inpatient Medications:  . atorvastatin  80 mg Oral Q lunch  . cholecalciferol  2,000 Units Oral Daily  . colchicine  0.6 mg Oral QODAY  . docusate sodium  100 mg Oral BID  . ezetimibe  10 mg Oral Daily  . furosemide  20 mg Oral BID  . levothyroxine  75 mcg Oral QAC breakfast  . metoprolol tartrate  25 mg Oral BID  . sodium chloride  3 mL Intravenous Q12H  . sodium chloride  3 mL Intravenous Q12H   . heparin 1,000 Units/hr (10/30/14 0200)    Allergies:  Allergies  Allergen Reactions  . Zyban [Bupropion]  Itching    History   Social History  . Marital Status: Married    Spouse Name: divorced.  . Number of Children: 1  . Years of Education: N/A   Occupational History  . retired. still works for [employer].     Social History Main Topics  . Smoking status: Former Smoker -- 0.30 packs/day for 44 years    Types: Cigarettes    Quit date: 04/07/2009  . Smokeless tobacco: Never Used  . Alcohol Use: Yes     Comment: 2 to 3 glasses wine per week  . Drug Use: No  . Sexual Activity: No   Other Topics Concern  . Not on file   Social History Narrative   Pt lives alone with 2 dogs.      Family History  Problem Relation Age of Onset  . Heart disease Sister     Good Pastures Syndrome  . Gout Father   . Lung cancer Father   . Breast cancer Paternal Aunt      Review of Systems: She reports occasional trace pedal edema. No orthopnea, PND. All other systems reviewed and are otherwise negative except as noted above.  Labs:  Recent Labs  10/29/14 1347 10/29/14 1758 10/30/14 0016 10/30/14 0625  TROPONINI 1.33* 1.37* 1.13* 1.03*   Lab Results  Component Value Date   WBC 8.5 10/30/2014   HGB 13.8 10/30/2014   HCT 42.0 10/30/2014   MCV 90.9 10/30/2014   PLT 256 10/30/2014    Recent Labs Lab 10/29/14 0052  NA 140  K 3.7  CL 109  CO2 22  BUN 13  CREATININE 1.22*  CALCIUM 8.8*  GLUCOSE 109*   Lab Results  Component Value Date   CHOL 137 09/27/2014   HDL 49.50 09/27/2014   LDLCALC 72 09/27/2014   TRIG 76.0 09/27/2014   Lab Results  Component Value Date   DDIMER 0.42 04/24/2013    Radiology/Studies:  Dg Chest 2 View  10/28/2014   CLINICAL DATA:  Shortness of breath with chest pressure  EXAM: CHEST  2 VIEW  COMPARISON:  June 25, 2014  FINDINGS: There is mild scarring in the lingula, stable. Lungs elsewhere clear. Heart is borderline enlarged with pulmonary vascularity within normal limits. No adenopathy. No bone lesions.  IMPRESSION: Mild scarring in the lingula. No  edema or consolidation. Stable cardiac prominence.   Electronically Signed     By: Lowella Grip III M.D.   On: 10/28/2014 16:37    EKG: 1) probable atrial flutter 140bpm, LVH with repol abnormalities, QTc 565 2) NSR 63bpm LVH with repol abnormalities QTc 431m-460  one PVC   Physical Exam: Blood pressure 144/79, pulse 56, temperature 97.7 F (36.5 C), temperature source Oral, resp. rate 20, height 5' 3" (1.6 m), weight 213 lb (96.616 kg), SpO2 98 %. General: Well developed, well nourished AAF in no acute distress. Head: Normocephalic, atraumatic, sclera non-icteric, no xanthomas, nares are without discharge.  Neck: Negative for carotid bruits. JVD not elevated. Lungs: Somewhat diminished throught but no wheezes, rales, or rhonchi. Breathing is unlabored. Heart: RRR with S1 S2. No murmurs, rubs, or gallops appreciated. Abdomen: Soft, non-tender, non-distended with normoactive bowel sounds. No hepatomegaly. No rebound/guarding. No obvious abdominal masses. Msk:  Strength and tone appear normal for age. Extremities: No clubbing or cyanosis. No edema.  Distal pedal pulses are 2+ and equal bilaterally. Neuro: Alert and oriented X 3. No facial asymmetry. No focal deficit. Moves all extremities spontaneously. Psych:  Responds to questions appropriately with a normal affect.    Assessment and Plan:   1. Recurrent paroxysmal atrial flutter 2. H/o paroxysmal atrial fibrillation 3. Sinus bradycardia - HR currently in the 40s 3. H/o chronic diastolic CHF with h/o apical predominant hypertrophic cardiomyopathy, appearing euvolemic 4. Hypothyroidism with abnormal TSH, free T4 5. NSTEMI with history of CAD, previously managed medically 6. CKD stage III  Will discuss plan for treatment of atrial arrhythmias with. Amy [name] Amiodarone stopped in the past due to questionable toxicity. Not a candidate for Tikosyn with QT prolongation. Not a candidate for 1C agents due to history of CAD, apical  hypertrophy. HR has come down to the 40's at times even with gentle titration of her BB. May need to consider pacer so that we can treat higher rates without exacerbating bradycardia further. Recheck TSH and free T4 given abnormal TSH.  Also, I agree with consideration for cath this admission to exclude progression of disease in other vessels. She does report exertional chest pressure and dyspnea even in the absence of palpitations. Would gently hydrate the AM of cath given renal insufficiency. No evidence of CHF on exam today. Will review with MD.  Amy Hitt Dunn PA-C 10/30/2014, 11:22 AM   The patient has had recurrent atrial fibrillation and atrial flutter that is associated with significant shortness of breath and positive troponin. Antiarrhythmics drug options are very limited by QT prolongation, coronary disease, left ventricular hypertrophy and amiodarone lung toxicity. I will start her on dronaderone for the short-term.  Interestingly, she can tell the difference between the irregular tachycardia documented in Early July and the regular tachycardia with which she presented  She has an appointment in early August to see Dr. JGreggory Brandyfor consideration of ablation. In this regard reassessment of left atrial dimension is important. Furthermore, kind understand her cardiac structure, i.e. that she had HCM, has important implications related to procedural success as well as family care issues.  She has had echocardiograms in the last 3 years red very differently as relates to apical hypertrophy. We will arrange for cardiac MRI which can be done without gadolinium to assess features for HCM

## 2014-10-31 NOTE — Progress Notes (Signed)
   SUBJECTIVE:  No chest pain. No palpitations.  Tolerated cath well.   OBJECTIVE:   Vitals:   Filed Vitals:   10/31/14 0836 10/31/14 0900 10/31/14 0916 10/31/14 0930  BP: 124/69 131/63 131/61 130/66  Pulse: 64 51 48 42  Temp:      TempSrc:      Resp: _0 Height:      Weight:      SpO2: 94% 96% 92% 95%   I&O's:   Intake/Output Summary (Last 24 hours) at 10/31/14 0942 Last data filed at 10/31/14 0404  Gross per 24 hour  Intake    603 ml  Output   2625 ml  Net  -2022 ml   TELEMETRY: Reviewed telemetry pt in sinus brady:     PHYSICAL EXAM General: Well developed, well nourished, in no acute distress Head:   Normal cephalic and atramatic  Lungs:   Clear bilaterally to auscultation. Heart:   Bradycardia S1 S2  No JVD.   Abdomen: abdomen soft and non-tender Msk:  Back normal,  Normal strength and tone for age. Extremities:   No edema.   Neuro: Alert and oriented. Psych:  Normal affect, responds appropriately Skin: No rash   LABS: Basic Metabolic Panel:  Recent Labs  10/29/14 0052 10/31/14 0427  NA 140 136  K 3.7 3.8  CL 109 104  CO2 22 22  GLUCOSE 109* 100*  BUN 13 17  CREATININE 1.22* 1.18*  CALCIUM 8.8* 8.9   Liver Function Tests: No results for input(s): AST, ALT, ALKPHOS, BILITOT, PROT, ALBUMIN in the last 72 hours. No results for input(s): LIPASE, AMYLASE in the last 72 hours. CBC:  Recent Labs  10/30/14 0625 10/31/14 0427  WBC 8.5 9.5  HGB 13.8 14.5  HCT 42.0 43.6  MCV 90.9 90.3  PLT 256 273   Cardiac Enzymes:  Recent Labs  10/29/14 1758 10/30/14 0016 10/30/14 0625  TROPONINI 1.37* 1.13* 1.03*   BNP: Invalid input(s): POCBNP D-Dimer: No results for input(s): DDIMER in the last 72 hours. Hemoglobin A1C:  Recent Labs  10/28/14 1921  HGBA1C 6.1*   Fasting Lipid Panel: No results for input(s): CHOL, HDL, LDLCALC, TRIG, CHOLHDL, LDLDIRECT in the last 72 hours. Thyroid Function Tests:  Recent Labs  10/31/14 0427    TSH 7.828*   Anemia Panel: No results for input(s): VITAMINB12, FOLATE, FERRITIN, TIBC, IRON, RETICCTPCT in the last 72 hours. Coag Panel:   Lab Results  Component Value Date   INR 1.06 10/30/2014   INR 1.04 06/01/2013   INR 2.2* 05/30/2013    RADIOLOGY: Dg Chest 2 View  10/28/2014   CLINICAL DATA:  Shortness of breath with chest pressure  EXAM: CHEST  2 VIEW  COMPARISON:  June 25, 2014  FINDINGS: There is mild scarring in the lingula, stable. Lungs elsewhere clear. Heart is borderline enlarged with pulmonary vascularity within normal limits. No adenopathy. No bone lesions.  IMPRESSION: Mild scarring in the lingula. No edema or consolidation. Stable cardiac prominence.   Electronically Signed   By: Lowella Grip III M.D.   On: 10/28/2014 16:37      ASSESSMENT: CAD, AFib  PLAN:  Branch vessel disease noted.  No PCI. Demand ischemia.   COntinue medical therapy.  Await Cardiac MRI and EP recs for PAF.  R wrist stable post cath.  Jettie Booze, MD  10/31/2014  9:42 AM

## 2014-10-31 NOTE — Care Management Important Message (Signed)
Important Message  Patient Details  Name: Amy [name] MRN: [id number] Date of Birth: 1945/10/23   Medicare Important Message Given:  [facility] notification given    Nathen May 10/31/2014, 2:05 [place] Message  Patient Details  Name: Amy [name] MRN: [id number] Date of Birth: 19-Aug-1945   Medicare Important Message Given:  Yes-second notification given    Nathen May 10/31/2014, 2:05 PM

## 2014-10-31 NOTE — Progress Notes (Signed)
D/w Dr. Johnsie Cancel, [place] to go ahead with cardiac MRI later today with contrast, GFR 53. He has discussed with radiology.  Dayna Dunn PA-C

## 2014-10-31 NOTE — Progress Notes (Signed)
[facility] PROGRESS NOTE  Assessment/Plan: Chest pain: - She has remained chest pain-free, cardiology consulted. - She was started on heparin, cardiac cath was recommended and performed on 10/31/2014 she had nonobstructive disease not amenable to PCI. Cardiology recommended a cardiac MRI and EP consult.  Atrial fibrillation with RVR/  Paroxysmal atrial fibrillation/flutter: - now rate controlled and Sinus rhtythm. - Stop heparin start Xarelto. - EP consulted  Chronic diastolic heart failure - Euvolemic cont current therapy.  Hypothyroidism - mildly elevated repeat TSH in 6 weeks.    Code Status: full Family Communication: noen  Disposition Plan: observation   Consultants:  cardiology  Procedures:  Echo  Antibiotics:  None  HPI/Subjective: Chest pain and SOB free.  Objective: Filed Vitals:   10/31/14 0836 10/31/14 0900 10/31/14 0916 10/31/14 0930  BP: 124/69 131/63 131/61 130/66  Pulse: 64 51 48 42  Temp:      TempSrc:      Resp: _0 Height:      Weight:      SpO2: 94% 96% 92% 95%    Intake/Output Summary (Last 24 hours) at 10/31/14 1034 Last data filed at 10/31/14 0404  Gross per 24 hour  Intake    360 ml  Output   2625 ml  Net  -2265 ml   Filed Weights   10/28/14 1552 10/29/14 0617 10/31/14 0429  Weight: 95.255 kg (210 lb) 96.616 kg (213 lb) 95.255 kg (210 lb)    Exam:  General: Alert, awake, oriented x3, in no acute distress.  HEENT: No bruits, no goiter.  Heart: Regular rate and rhythm. Lungs: Good air movement, clear   Data Reviewed: Basic Metabolic Panel:  Recent Labs Lab 10/28/14 1608 10/29/14 0052 10/31/14 0427  NA 138 140 136  K 3.9 3.7 3.8  CL 109 109 104  CO2 18* 22 22  GLUCOSE 90 109* 100*  BUN _1 CREATININE 1.15* 1.22* 1.18*  CALCIUM 8.7* 8.8* 8.9   Liver Function Tests: No results for input(s): AST, ALT, ALKPHOS, BILITOT, PROT, ALBUMIN in the last 168 hours. No results for input(s):  LIPASE, AMYLASE in the last 168 hours. No results for input(s): AMMONIA in the last 168 hours. CBC:  Recent Labs Lab 10/28/14 1608 10/29/14 0052 10/30/14 0625 10/31/14 0427  WBC 8.0 9.4 8.5 9.5  HGB 14.2 14.1 13.8 14.5  HCT 43.3 42.6 42.0 43.6  MCV 90.2 91.6 90.9 90.3  PLT 263 263 256 273   Cardiac Enzymes:  Recent Labs Lab 10/29/14 0822 10/29/14 1347 10/29/14 1758 10/30/14 0016 10/30/14 0625  TROPONINI 1.35* 1.33* 1.37* 1.13* 1.03*   BNP (last 3 results)  Recent Labs  06/25/14 1535  BNP 309.9*    ProBNP (last 3 results)  Recent Labs  04/12/14 1423 04/21/14 1016  PROBNP 156.0* 86.0    CBG: No results for input(s): GLUCAP in the last 168 hours.  No results found for this or any previous visit (from the past 240 hour(s)).   Studies: No results found.  Scheduled Meds: . aspirin EC  81 mg Oral Daily  . atorvastatin  80 mg Oral Q lunch  . cholecalciferol  2,000 Units Oral Daily  . colchicine  0.6 mg Oral QODAY  . docusate sodium  100 mg Oral BID  . dronedarone  400 mg Oral BID WC  . ezetimibe  10 mg Oral Daily  . furosemide  20 mg Oral BID  . levothyroxine  75 mcg Oral QAC breakfast  .  metoprolol tartrate  25 mg Oral BID  . rivaroxaban  20 mg Oral Q supper  . sodium chloride  3 mL Intravenous Q12H  . sodium chloride  3 mL Intravenous Q12H   Continuous Infusions: . sodium chloride 3 mL/kg/hr (10/31/14 0911)    Time Spent: 25 min   Charlynne Cousins  [facility] Pager [phone]. If 7PM-7AM, please contact night-coverage at [email or web address], password [facility] 10/31/2014, 10:34 AM  LOS: 2 days

## 2014-10-31 NOTE — Interval H&P Note (Signed)
History and Physical Interval Note:  10/31/2014 7:54 AM  Amy [name]   has presented today for surgery, with the diagnosis of cp with + Troponin (NSTEMI vs. Demand Ischemia).  The various methods of treatment have been discussed with the patient and family. After consideration of risks, benefits and other options for treatment, the patient has consented to  Procedure(s): Left Heart Cath and Coronary Angiography (N/[name]) as [name] surgical intervention .  The patient's history has been reviewed, patient examined, no change in status, stable for surgery.  I have reviewed the patient's chart and labs.  Questions were answered to the patient's satisfaction.     [place], [place]  Cath Lab Visit (complete for each Cath Lab visit)  Clinical Evaluation Leading to the Procedure:   ACS: Yes.    Non-ACS:    Anginal Classification: CCS III  Anti-ischemic medical therapy: Minimal Therapy (1 class of medications)  Non-Invasive Test Results: No non-invasive testing performed  Prior CABG: No previous CABG    TIMI Score  Patient Information:  TIMI Score is 3   UA/NSTEMI and intermediate-risk features (e.g., TIMI score 3-4) for short-term risk of death or nonfatal MI  Revascularization of the presumed culprit artery   [name] (9)  Indication: 10; Score: 9  HARDING, Leonie Green, M.D., M.S. Interventional Cardiologist   Pager # [phone]

## 2014-10-31 NOTE — Progress Notes (Signed)
At approximately 2030 pt had a 9 beat run of SVT. Pt did feel her heart race but was otherwise asymptomatic. Will continue to monitor pt.

## 2014-10-31 NOTE — Progress Notes (Signed)
Patient Name: Amy [name] Date of Encounter: 10/31/2014   Principal Problem:   Atrial flutter with RVR Active Problems:   Essential hypertension   Apical variant hypertrophic cardiomyopathy   Chronic diastolic heart failure   OSA (obstructive sleep apnea)   Pulmonary hypertension   Obesity-BMI 38   CAD (coronary artery disease)   Chronic renal insufficiency, stage III (moderate)   Atrial fibrillation with rapid ventricular response   Hypothyroidism   Chronic anticoagulation   Elevated troponin    SUBJECTIVE  Maintaining sinus rhythm, though bradycardic - 40's to 50's (asymptomatic).  Cath this AM showed moderate Diag dzs no targets for PCI.  Scheduled for cardiac MRI this PM.  No c/p or dyspnea.  CURRENT MEDS . aspirin EC  81 mg Oral Daily  . atorvastatin  80 mg Oral Q lunch  . cholecalciferol  2,000 Units Oral Daily  . colchicine  0.6 mg Oral QODAY  . docusate sodium  100 mg Oral BID  . dronedarone  400 mg Oral BID WC  . ezetimibe  10 mg Oral Daily  . furosemide  20 mg Oral BID  . levothyroxine  75 mcg Oral QAC breakfast  . metoprolol tartrate  25 mg Oral BID  . rivaroxaban  20 mg Oral Q supper    OBJECTIVE  Filed Vitals:   10/31/14 0836 10/31/14 0900 10/31/14 0916 10/31/14 0930  BP: 124/69 131/63 131/61 130/66  Pulse: 64 51 48 42  Temp:      TempSrc:      Resp: _0 Height:      Weight:      SpO2: 94% 96% 92% 95%    Intake/Output Summary (Last 24 hours) at 10/31/14 1056 Last data filed at 10/31/14 0404  Gross per 24 hour  Intake    360 ml  Output   2625 ml  Net  -2265 ml   Filed Weights   10/28/14 1552 10/29/14 0617 10/31/14 0429  Weight: 210 lb (95.255 kg) 213 lb (96.616 kg) 210 lb (95.255 kg)    PHYSICAL EXAM  General: Pleasant, NAD. Neuro: Alert and oriented X 3. Moves all extremities spontaneously. Psych: Normal affect. HEENT:  Normal  Neck: Supple without bruits or JVD. Lungs:  Resp regular and unlabored, CTA. Heart:  RRR, brady, no s3, s4, or murmurs. Abdomen: Soft, non-tender, non-distended, BS + x 4.  Extremities: No clubbing, cyanosis or edema. DP/PT/Radials 2+ and equal bilaterally.  R wrist cath site w/ TR band in place.  No bleeding/hematoma.  Accessory Clinical Findings  CBC  Recent Labs  10/30/14 0625 10/31/14 0427  WBC 8.5 9.5  HGB 13.8 14.5  HCT 42.0 43.6  MCV 90.9 90.3  PLT 256 601   Basic Metabolic Panel  Recent Labs  10/29/14 0052 10/31/14 0427  NA 140 136  K 3.7 3.8  CL 109 104  CO2 22 22  GLUCOSE 109* 100*  BUN 13 17  CREATININE 1.22* 1.18*  CALCIUM 8.8* 8.9   Cardiac Enzymes  Recent Labs  10/29/14 1758 10/30/14 0016 10/30/14 0625  TROPONINI 1.37* 1.13* 1.03*   Hemoglobin A1C  Recent Labs  10/28/14 1921  HGBA1C 6.1*   Thyroid Function Tests  Recent Labs  10/31/14 0427  TSH 7.828*    TELE  Sinus brady - 40's to 50's.  No prolonged pauses.  Radiology/Studies  Dg Chest 2 View  10/28/2014   CLINICAL DATA:  Shortness of breath with chest pressure  EXAM: CHEST  2 VIEW  COMPARISON:  June 25, 2014  FINDINGS: There is mild scarring in the lingula, stable. Lungs elsewhere clear. Heart is borderline enlarged with pulmonary vascularity within normal limits. No adenopathy. No bone lesions.  IMPRESSION: Mild scarring in the lingula. No edema or consolidation. Stable cardiac prominence.   Electronically Signed   By: Lowella Grip III M.D.   On: 10/28/2014 16:37    ASSESSMENT AND PLAN  1. Atrial flutter w/ RVR; h/o PAFib:  Maintaining sinus.  Dronedarone added yesterday - f/u ECG this AM.  Cardiac MRI scheduled for this afternoon to better eval cardiac structure and hypertrophy to be followed by consideration of ablative options.  Dr. Rayann Heman to see today.  2.  Elevated troponin/CAD:  S/p cath today revealing moderate D1 dzs, with minimal dzs elsewhere.  No chest pain.  Cont asa, statin, bb, zetia.  3.  Chronic diastolic chf:  Euvolemic.  HR/BP  controlled.  4.  Essential HTN:  Stable.  5.  QT prolongation:  F/u 12 lead ecg this am.  6.  Sinus Bradycardia:  Drops to 40.  No prolonged pauses or evidence of heart block.  Asymptomatic.  7.  Hypertrophic Cardiomyopathy:  Euvolemic.  Cardiac MRI today.  Cont bb.  Consider ACEI.  8.  Hypothyroidism:  On synthroid.  TSH mildly elevated, FT4 wnl.  Suspect sick euthyroid.  Signed, Murray Hodgkins NP   I have seen, examined the patient, and reviewed the above assessment and plan.  On exam, she is sleepy but rouses, NAD.  RRR. Changes to above are made where necessary.   Would anticipate discharge tomorrow on multaq.  Follow-up with Dr Caryl Comes in the office. Anticipated success rates for ablation are likely reduced given atypical atrial flutter.  Could consider ablation down the road if she fails medical therapy with multaq.  Co Sign: [name], MD 10/31/2014 12:34 PM

## 2014-11-01 DIAGNOSIS — I4891 Unspecified atrial fibrillation: Secondary | ICD-10-CM

## 2014-11-01 DIAGNOSIS — I25118 Atherosclerotic heart disease of native coronary artery with other forms of angina pectoris: Secondary | ICD-10-CM

## 2014-11-01 LAB — CBC
HEMATOCRIT: 42.1 % (ref 36.0–46.0)
HEMOGLOBIN: 13.9 g/dL (ref 12.0–15.0)
MCH: 29.9 pg (ref 26.0–34.0)
MCHC: 33 g/dL (ref 30.0–36.0)
MCV: 90.5 fL (ref 78.0–100.0)
Platelets: 269 10*3/uL (ref 150–400)
RBC: 4.65 MIL/uL (ref 3.87–5.11)
RDW: 16.2 % — ABNORMAL HIGH (ref 11.5–15.5)
WBC: 8.1 10*3/uL (ref 4.0–10.5)

## 2014-11-01 LAB — HEPATIC FUNCTION PANEL
ALT: 15 U/L (ref 14–54)
AST: 17 U/L (ref 15–41)
Albumin: 3.5 g/dL (ref 3.5–5.0)
Alkaline Phosphatase: 76 U/L (ref 38–126)
BILIRUBIN TOTAL: 0.8 mg/dL (ref 0.3–1.2)
Total Protein: 6.8 g/dL (ref 6.5–8.1)

## 2014-11-01 LAB — BASIC METABOLIC PANEL
Anion gap: 9 (ref 5–15)
BUN: 20 mg/dL (ref 6–20)
CALCIUM: 9 mg/dL (ref 8.9–10.3)
CO2: 22 mmol/L (ref 22–32)
Chloride: 106 mmol/L (ref 101–111)
Creatinine, Ser: 1.27 mg/dL — ABNORMAL HIGH (ref 0.44–1.00)
GFR calc Af Amer: 49 mL/min — ABNORMAL LOW (ref >60)
GFR, EST NON AFRICAN AMERICAN: 42 mL/min — AB (ref >60)
Glucose, Bld: 89 mg/dL (ref 65–99)
POTASSIUM: 4 mmol/L (ref 3.5–5.1)
Sodium: 137 mmol/L (ref 135–145)

## 2014-11-01 LAB — APTT: APTT: 48 s — AB (ref 24–37)

## 2014-11-01 MED ORDER — METOPROLOL TARTRATE 12.5 MG HALF TABLET
12.5000 mg | ORAL_TABLET | Freq: Two times a day (BID) | ORAL | Status: DC
  Administered 2014-11-01: 12.5 mg via ORAL
  Filled 2014-11-01: qty 1

## 2014-11-01 MED ORDER — ASPIRIN 81 MG PO TBEC: 81.0000 mg | DELAYED_RELEASE_TABLET | Freq: Every day | ORAL | Status: DC

## 2014-11-01 MED ORDER — DRONEDARONE HCL 400 MG PO TABS: 400.0000 mg | ORAL_TABLET | Freq: Two times a day (BID) | ORAL | Status: DC

## 2014-11-01 MED ORDER — METOPROLOL TARTRATE 25 MG PO TABS: 12.5000 mg | ORAL_TABLET | Freq: Two times a day (BID) | ORAL | Status: DC

## 2014-11-01 NOTE — Progress Notes (Signed)
Patient Name: Amy [name] Date of Encounter: 11/01/2014  Principal Problem:   Atrial flutter with RVR Active Problems:   Hypothyroidism   Essential hypertension   Apical variant hypertrophic cardiomyopathy   Chronic diastolic heart failure   OSA (obstructive sleep apnea)   Pulmonary hypertension   Obesity-BMI 38   Chronic anticoagulation   CAD (coronary artery disease)   Chronic renal insufficiency, stage III (moderate)   Atrial fibrillation with rapid ventricular response   Elevated troponin   Primary Cardiologist: Dr Radford Pax, has appt w/ Dr Rayann Heman (who saw her 07/26) on 08/02  Patient Profile: 69 yo female w/ HTN, HLD, chronic diastolic CHF, OSA, CAD, COPD, CKD stage III, and paroxysmal atrial fibrillation/paroxysmal atrial flutter on Xarelto, admitted 07/23 w/ palps and SOB. Elevated ez>cath w/ non-obs dz. Currently SR on dronedarone.  SUBJECTIVE: No chest pain or SOB, no palpitations.  OBJECTIVE Filed Vitals:   10/31/14 1045 10/31/14 1546 10/31/14 2106 11/01/14 0436  BP: 141/78 105/73 172/53 131/52  Pulse: 44 57 63 52  Temp:  97.9 F (36.6 C) 97.4 F (36.3 C) 97.8 F (36.6 C)  TempSrc:  Oral Oral Oral  Resp: 19 18    Height:      Weight:    211 lb 1.6 oz (95.754 kg)  SpO2: 96% 98% 96% 90%    Intake/Output Summary (Last 24 hours) at 11/01/14 0919 Last data filed at 10/31/14 2100  Gross per 24 hour  Intake    240 ml  Output   1700 ml  Net  -1460 ml   Filed Weights   10/29/14 0617 10/31/14 0429 11/01/14 0436  Weight: 213 lb (96.616 kg) 210 lb (95.255 kg) 211 lb 1.6 oz (95.754 kg)    PHYSICAL EXAM General: Well developed, well nourished, female in no acute distress. Head: Normocephalic, atraumatic.  Neck: Supple without bruits, JVD. Lungs:  Resp regular and unlabored, CTA. Heart: RRR, S1, S2, no S3, S4, or murmur; no rub. Abdomen: Soft, non-tender, non-distended, BS + x 4.  Extremities: No clubbing, cyanosis, edema.  Neuro: Alert and  oriented X 3. Moves all extremities spontaneously. Psych: Normal affect.  LABS: CBC:  Recent Labs  10/31/14 0427 11/01/14 0450  WBC 9.5 8.1  HGB 14.5 13.9  HCT 43.6 42.1  MCV 90.3 90.5  PLT 273 269   INR:  Recent Labs  10/30/14 2121  INR 8.93   Basic Metabolic Panel:  Recent Labs  10/31/14 0427 11/01/14 0450  NA 136 137  K 3.8 4.0  CL 104 106  CO2 22 22  GLUCOSE 100* 89  BUN 17 20  CREATININE 1.18* 1.27*  CALCIUM 8.9 9.0   Liver Function Tests:No results for input(s): AST, ALT, ALKPHOS, BILITOT, PROT, ALBUMIN in the last 72 hours. Cardiac Enzymes:  Recent Labs  10/29/14 1758 10/30/14 0016 10/30/14 0625  TROPONINI 1.37* 1.13* 1.03*   BNP:  B NATRIURETIC PEPTIDE  Date/Time Value Ref Range Status  06/25/2014 03:35 PM 309.9* 0.0 - 100.0 pg/mL Final   Thyroid Function Tests:  Recent Labs  10/31/14 0427  TSH 7.828*  FREE T4 0.83   TELE:  SR, sinus brady, sustained in the 30s overnight. Brief run of atypical SVT, 9 bts      Radiology/Studies: Mr Card Morphology Wo/w Cm 11/01/2014   CLINICAL DATA:  Hypertrophic Cardiomyopathy  EXAM: CARDIAC MRI  TECHNIQUE: The patient was scanned on a 1.5 Tesla GE magnet. A dedicated cardiac coil was used. Functional imaging was done using Fiesta sequences.  2,3, and 4 chamber views were done to assess for RWMA's. Modified Simpson's rule using a short axis stack was used to calculate an ejection fraction on a dedicated work Conservation officer, nature. The patient received 27 cc of Multihance. After 10 minutes inversion recovery sequences were used to assess for infiltration and scar tissue.  CONTRAST:  27 cc Multihance  FINDINGS: There was severe LAE. The RA/RV were normal in size and function. The mitral aortic and tricuspid valves were structurally normal. There was no ASD/VSD. There was no pericardial effusion The aortic root was normal  The LV cavity was spade like. There was uniform moderate LVH with septal and posterior  wall thickness of 14 mm. In systole there was mid and apical cavity obliteration. There was no SAM  Delayed gadolinium images showed mild mid myocardial scar tissue at the apex  Quantitative EF 79% (EDV 68 cc ESV 14 cc SV 54 cc)  IMPRESSION: 1) Constellation of findings consistent with mid/apical hypertrophic cardiomyopathy. With spade-like ventricle and mid and apical cavity obliteration in systole  2) Mild mid myocardial gadolinium uptake in LV apex  3) Severe LAE likely indicating high LVEDP  4) Hyperdynamic EF 79%  Jenkins Rouge   Electronically Signed   By: Jenkins Rouge M.D.   On: 11/01/2014 08:17     Current Medications:  . aspirin EC  81 mg Oral Daily  . atorvastatin  80 mg Oral Q lunch  . cholecalciferol  2,000 Units Oral Daily  . colchicine  0.6 mg Oral QODAY  . docusate sodium  100 mg Oral BID  . dronedarone  400 mg Oral BID WC  . ezetimibe  10 mg Oral Daily  . furosemide  20 mg Oral BID  . levothyroxine  75 mcg Oral QAC breakfast  . metoprolol tartrate  25 mg Oral BID  . rivaroxaban  20 mg Oral Q supper      ASSESSMENT AND PLAN: Principal Problem:   Atrial flutter with RVR - maintaining SR on dronedarone - however, with bradycardia, will decrease metoprolol to 12.5 mg bid. - ck LFTs, can also cause hepatic toxicity - pt to keep appt w/ Dr Rayann Heman - MD advise on Holter to eval for bradycardia  Hypothyroid - TSH slightly elevated but free T4 is OK - on home dose of synthroid - per IM    Elevated troponin - s/p cath w/ D1 75%, med rx - on ASA, statin, BB as HR will tolerate    Apical variant hypertrophic cardiomyopathy - see MRI report above - lungs sound OK and no sig JVD, but ?high LVEDP on MRI - also with partial cavity obliteration in systole so no change in Lasix dosing   Otherwise, per IM. No further inpatient workup needed. Active Problems:   Essential hypertension   Chronic diastolic heart failure   OSA (obstructive sleep apnea)   Pulmonary hypertension    Obesity-BMI 38   Chronic anticoagulation   CAD (coronary artery disease)   Chronic renal insufficiency, stage III (moderate)   Atrial fibrillation with rapid ventricular response    Signed, Barrett, Rhonda , PA-C 9:19 AM 11/01/2014  I have examined the patient and reviewed assessment and plan and discussed with patient.  Agree with above as stated.  D/c on dronedarone to maintain NSR.  No sx of bradycardia at this time but she will have to continue to monitor.  Emmalyn Hinson S.

## 2014-11-01 NOTE — Discharge Summary (Addendum)
Physician Discharge Summary  Amy [name] LPF:[id number] DOB: 03-27-1946 DOA: 10/28/2014  PCP: [name], MD  Admit date: 10/28/2014 Discharge date: 11/01/2014  Time spent: 35 minutes  Recommendations for Outpatient Follow-up:  1. Follow-up with electrophysiologist next week.  Discharge Diagnoses:  Principal Problem:   Atrial flutter with RVR Active Problems:   Hypothyroidism   Essential hypertension   Apical variant hypertrophic cardiomyopathy   Chronic diastolic heart failure   OSA (obstructive sleep apnea)   Pulmonary hypertension   Obesity-BMI 38   Chronic anticoagulation   CAD (coronary artery disease)   Chronic renal insufficiency, stage III (moderate)   Atrial fibrillation with rapid ventricular response   Elevated troponin   Discharge Condition: stable  Diet recommendation: heart healthy  Filed Weights   10/29/14 0617 10/31/14 0429 11/01/14 0436  Weight: 96.616 kg (213 lb) 95.255 kg (210 lb) 95.754 kg (211 lb 1.6 oz)    History of present illness:  Amy [name] is a 69 y.o. female with past menstrual history of chronic atrial fibrillation on Xarelto that comes in for palpitations and shortness of breath on the prior to admission. She took her Lopressor and it improve. She woke up on the day of admission her symptom has resolved then later in the day she started getting chest pressure shortness of breath and palpitations. She went to see her family physician who referred her to the emergency room, it was discussed with the cardiologist recommended to admit for chest pain rule out and monitor heart rate.  Hospital Course:  Chest pain likely due to A. fib with RVR/A flutter apical variant of hypertrophic cardiomyopathy: - Her enzymes were trending up slowly cardiology recommended a cardiac cath. - She was started on heparin, cardiac cath was recommended and performed on 10/31/2014 she had nonobstructive disease not amenable to PCI. Cardiology  recommended a cardiac MRI with results as below.  In EP consultation was obtained the recommended multaq and follow-up with them in 1 week.  Atrial fibrillation with RVR/ Paroxysmal atrial fibrillation/flutter: - Cardiology recommended an - EP was consult ion. - She was rate controlled, was started on Multaq and metoprolol was decreased.  Chronic diastolic heart failure - Euvolemic cont current therapy.  Hypothyroidism - mildly elevated repeat TSH in 6 weeks.  Procedures: Cardiac MRI on 10/31/2014 that showed Constellation of findings consistent with mid/apical hypertrophic cardiomyopathy. With spade-like ventricle and mid and apical cavity obliteration in systole Cardiac cath 10/31/2014 that showed Ost 1st Diag to 1st Diag lesion, 75% stenosed. This represents mild progression of disease but otherwise no significant disease throughout the coronary system.  Demand Ischemia the Myocardium - related to existing disease, hypertrophic cardiomyopathy and atrial flutter with rapid ventricular rate Mildly elevated LVEDP.  Consultations:  cardiology  Discharge Exam: Filed Vitals:   11/01/14 0436  BP: 131/52  Pulse: 52  Temp: 97.8 F (36.6 C)  Resp:     General: A&O x3 Cardiovascular: Irregular rate and rhythm Respiratory: Good air movement and clear to auscultation.  Discharge Instructions   Discharge Instructions    Diet - low sodium heart healthy    Complete by:  As directed      Increase activity slowly    Complete by:  As directed           Current Discharge Medication List    START taking these medications   Details  aspirin EC 81 MG EC tablet Take 1 tablet (81 mg total) by mouth daily. Qty: 30 tablet, Refills: 0  dronedarone (MULTAQ) 400 MG tablet Take 1 tablet (400 mg total) by mouth 2 (two) times daily with a meal. Qty: 14 tablet, Refills: 0      CONTINUE these medications which have CHANGED   Details  metoprolol tartrate (LOPRESSOR) 25 MG tablet Take  0.5 tablets (12.5 mg total) by mouth 2 (two) times daily. Qty: 30 tablet, Refills: 0      CONTINUE these medications which have NOT CHANGED   Details  acetaminophen (TYLENOL) 500 MG tablet Take 500 mg by mouth daily as needed (pain). Counteract by Melaleuca    albuterol (PROVENTIL HFA;VENTOLIN HFA) 108 (90 BASE) MCG/ACT inhaler Inhale 2 puffs into the lungs every 6 (six) hours as needed for wheezing or shortness of breath.     atorvastatin (LIPITOR) 80 MG tablet Take 1 tablet (80 mg total) by mouth daily. Qty: 30 tablet, Refills: 6    Cholecalciferol (VITAMIN D) 2000 UNITS CAPS Take 2,000 Units by mouth daily.     colchicine 0.6 MG tablet Take 0.6 mg by mouth every other day.     ezetimibe (ZETIA) 10 MG tablet Take 1 tablet (10 mg total) by mouth daily. Qty: 30 tablet, Refills: 6    furosemide (LASIX) 20 MG tablet Take 1 tablet (20 mg total) by mouth 3 (three) times daily. Qty: 60 tablet, Refills: 5    levothyroxine (SYNTHROID, LEVOTHROID) 75 MCG tablet Take 75 mcg by mouth daily before breakfast.    nitroGLYCERIN (NITROSTAT) 0.4 MG SL tablet Place 1 tablet (0.4 mg total) under the tongue every 5 (five) minutes as needed for chest pain. Qty: 25 tablet, Refills: 3    XARELTO 20 MG TABS tablet TAKE 1 TABLET BY MOUTH EVERY DAY PER DR TURNER Qty: 30 tablet, Refills: 4    zolpidem (AMBIEN) 5 MG tablet Take 2.5 mg by mouth at bedtime as needed for sleep.        Allergies  Allergen Reactions  . Amiodarone     Dyspnea - felt to be 2/2 amio lung toxicity  . Zyban [Bupropion] Itching   Follow-up Information    Follow up with CARROLL,DONNA, NP On 11/06/2014.   Specialties:  Nurse Practitioner, Cardiology   Why:  See provider at 11:00 am, please arrive 15 minutes early for paperwork.   Contact information:   [place] [address] [phone]       Follow up with [name], MD.   Specialty:  Cardiology   Why:  Hospital follow-up on 11/07/2014   Contact  information:   [place] [address] [phone]        The results of significant diagnostics from this hospitalization (including imaging, microbiology, ancillary and laboratory) are listed below for reference.    Significant Diagnostic Studies: Dg Chest 2 View  10/28/2014   CLINICAL DATA:  Shortness of breath with chest pressure  EXAM: CHEST  2 VIEW  COMPARISON:  June 25, 2014  FINDINGS: There is mild scarring in the lingula, stable. Lungs elsewhere clear. Heart is borderline enlarged with pulmonary vascularity within normal limits. No adenopathy. No bone lesions.  IMPRESSION: Mild scarring in the lingula. No edema or consolidation. Stable cardiac prominence.   Electronically Signed   By: Lowella Grip III M.D.   On: 10/28/2014 16:37   Mr Card Morphology Wo/w Cm  11/01/2014   CLINICAL DATA:  Hypertrophic Cardiomyopathy  EXAM: CARDIAC MRI  TECHNIQUE: The patient was scanned on a 1.5 Tesla GE magnet. A dedicated cardiac  coil was used. Functional imaging was done using Fiesta sequences. 2,3, and 4 chamber views were done to assess for RWMA's. Modified Simpson's rule using a short axis stack was used to calculate an ejection fraction on a dedicated work Conservation officer, nature. The patient received 27 cc of Multihance. After 10 minutes inversion recovery sequences were used to assess for infiltration and scar tissue.  CONTRAST:  27 cc Multihance  FINDINGS: There was severe LAE. The RA/RV were normal in size and function. The mitral aortic and tricuspid valves were structurally normal. There was no ASD/VSD. There was no pericardial effusion The aortic root was normal  The LV cavity was spade like. There was uniform moderate LVH with septal and posterior wall thickness of 14 mm. In systole there was mid and apical cavity obliteration. There was no SAM  Delayed gadolinium images showed mild mid myocardial scar tissue at the apex  Quantitative EF 79% (EDV 68 cc ESV 14  cc SV 54 cc)  IMPRESSION: 1) Constellation of findings consistent with mid/apical hypertrophic cardiomyopathy. With spade-like ventricle and mid and apical cavity obliteration in systole  2) Mild mid myocardial gadolinium uptake in LV apex  3) Severe LAE likely indicating high LVEDP  4) Hyperdynamic EF 79%  Jenkins Rouge   Electronically Signed   By: Jenkins Rouge M.D.   On: 11/01/2014 08:17    Microbiology: No results found for this or any previous visit (from the past 240 hour(s)).   Labs: Basic Metabolic Panel:  Recent Labs Lab 10/28/14 1608 10/29/14 0052 10/31/14 0427 11/01/14 0450  NA 138 140 136 137  K 3.9 3.7 3.8 4.0  CL 109 109 104 106  CO2 18* _0 GLUCOSE 90 109* 100* 89  BUN _1 CREATININE 1.15* 1.22* 1.18* 1.27*  CALCIUM 8.7* 8.8* 8.9 9.0   Liver Function Tests:  Recent Labs Lab 11/01/14 0450  AST 17  ALT 15  ALKPHOS 76  BILITOT 0.8  PROT 6.8  ALBUMIN 3.5   No results for input(s): LIPASE, AMYLASE in the last 168 hours. No results for input(s): AMMONIA in the last 168 hours. CBC:  Recent Labs Lab 10/28/14 1608 10/29/14 0052 10/30/14 0625 10/31/14 0427 11/01/14 0450  WBC 8.0 9.4 8.5 9.5 8.1  HGB 14.2 14.1 13.8 14.5 13.9  HCT 43.3 42.6 42.0 43.6 42.1  MCV 90.2 91.6 90.9 90.3 90.5  PLT 263 263 256 273 269   Cardiac Enzymes:  Recent Labs Lab 10/29/14 0822 10/29/14 1347 10/29/14 1758 10/30/14 0016 10/30/14 0625  TROPONINI 1.35* 1.33* 1.37* 1.13* 1.03*   BNP: BNP (last 3 results)  Recent Labs  06/25/14 1535  BNP 309.9*    ProBNP (last 3 results)  Recent Labs  04/12/14 1423 04/21/14 1016  PROBNP 156.0* 86.0    CBG: No results for input(s): GLUCAP in the last 168 hours.     Signed:  Charlynne Cousins  [facility] 11/01/2014, 10:42 AM

## 2014-11-01 NOTE — Progress Notes (Signed)
Pt's HR dipped into the 30s several times throughout the night; pt's baseline was SB in the 40s. Pt's HR did not go below 36. Pt remained asymptomatic; VSS.

## 2014-11-06 ENCOUNTER — Inpatient Hospital Stay (HOSPITAL_COMMUNITY): Admission: RE | Admit: 2014-11-06 | Payer: Medicare Other | Source: Ambulatory Visit | Admitting: Nurse Practitioner

## 2014-11-08 ENCOUNTER — Ambulatory Visit (HOSPITAL_COMMUNITY)
Admission: RE | Admit: 2014-11-08 | Discharge: 2014-11-08 | Disposition: A | Discharge status: Home / Self Care | Payer: Medicare Other | Source: Ambulatory Visit | Attending: Nurse Practitioner | Admitting: Nurse Practitioner

## 2014-11-08 VITALS — BP 120/74 | HR 49 | Ht 63.0 in | Wt 207.4 lb

## 2014-11-08 DIAGNOSIS — I480 Paroxysmal atrial fibrillation: Secondary | ICD-10-CM | POA: Diagnosis not present

## 2014-11-08 NOTE — Patient Instructions (Signed)
Appointment with Dr. Rayann Heman is set up for August 24th @ 8:45.   [id number]  Be sure to take multaq twice a day with food.

## 2014-11-09 ENCOUNTER — Encounter (HOSPITAL_COMMUNITY): Payer: Self-pay | Admitting: Nurse Practitioner

## 2014-11-09 NOTE — Progress Notes (Signed)
Patient ID: Amy [name], female   DOB: September 06, 1945, 69 y.o.   MRN: [id number]     Primary Care Physician: [name], MD Referring Physician:   CLYDELL ALBERTS is a 69 y.o. female with a h/o paf/flutter that was recently in [facility] 2/26 for aflutter with RVR. Has has failed tikosyn, amiodarone in the past . Troponin was elevated and she had LHC and was found to have moderate D1 disease to be medically managed. She was placed on multaq and d/ced home in sinus rhythm.  She returns to the afib clinic today reporting no more issues with afib/flutter but has been having diarrhea with multaq. She has not been taking with food however. She is suppose to be seeing Dr. Rayann Heman to discuss ablation but  needs an appointment. No other concerns voiced today, except for the expense of multaq. Heart rate is slow but she watches on her fitbit and rate is usually 50-60, asymptomatic..  Today, she denies symptoms of palpitations, chest pain, shortness of breath, orthopnea, PND, lower extremity edema, dizziness, presyncope, syncope, or neurologic sequela. Positive for diarrhea. The patient is tolerating medications without difficulties and is otherwise without complaint today.   Past Medical History  Diagnosis Date  . Hypertension   . Hyperlipidemia   . Hypothyroid   . COPD (chronic obstructive pulmonary disease)   . Dizziness     had PT for being off balance - in approx. 2012  . Lumbar disc disease   . Macular degeneration   . Chronic diastolic heart failure   . Apical variant hypertrophic cardiomyopathy     Diagnosed by echo and ECG 11/13  . GERD (gastroesophageal reflux disease)   . Paroxysmal atrial fibrillation/flutter        . Sinus bradycardia   . CAD (coronary artery disease)     a. LHC in 10/2011 demonstrated non-obstructive disease and an 80% lesion in a small D1 treated medically.   . OSA (obstructive sleep apnea)   . CKD (chronic kidney disease), stage III   . Pulmonary  hypertension     a. RHC 05/2013 showed mild PAH with normal PCWP and RA pressure, could be related to OSA and low oxygen saturation.   . QT prolongation    Past Surgical History  Procedure Laterality Date  . Cesarean section  1983  . Cardiac catheterization      normal coronary arteries  . Cataract extraction Bilateral   . Right heart catheterization N/A 06/01/2013    Procedure: RIGHT HEART CATH;  Surgeon: Larey Dresser, MD;  Location: [facility] CATH LAB;  Service: Cardiovascular;  Laterality: N/A;  . Cardiac catheterization N/A 10/31/2014    Procedure: Left Heart Cath and Coronary Angiography;  Surgeon: Leonie Man, MD;  Location: [place] CV LAB;  Service: Cardiovascular;  Laterality: N/A;    Current Outpatient Prescriptions  Medication Sig Dispense Refill  . acetaminophen (TYLENOL) 500 MG tablet Take 500 mg by mouth daily as needed (pain). Counteract by Melaleuca    . albuterol (PROVENTIL HFA;VENTOLIN HFA) 108 (90 BASE) MCG/ACT inhaler Inhale 2 puffs into the lungs every 6 (six) hours as needed for wheezing or shortness of breath.     Marland Kitchen aspirin EC 81 MG EC tablet Take 1 tablet (81 mg total) by mouth daily. 30 tablet 0  . atorvastatin (LIPITOR) 80 MG tablet Take 1 tablet (80 mg total) by mouth daily. (Patient taking differently: Take 80 mg by mouth daily with lunch. ) 30 tablet 6  . Cholecalciferol (VITAMIN  D) 2000 UNITS CAPS Take 2,000 Units by mouth daily.     . colchicine 0.6 MG tablet Take 0.6 mg by mouth every other day.     . dronedarone (MULTAQ) 400 MG tablet Take 1 tablet (400 mg total) by mouth 2 (two) times daily with a meal. 14 tablet 0  . ezetimibe (ZETIA) 10 MG tablet Take 1 tablet (10 mg total) by mouth daily. (Patient taking differently: Take 10 mg by mouth at bedtime. ) 30 tablet 6  . furosemide (LASIX) 20 MG tablet Take 1 tablet (20 mg total) by mouth 3 (three) times daily. (Patient taking differently: Take 40 mg by mouth See admin instructions. Take 1 tablet (20 mg) every  morning, take an additional tablet later in the day if staying at home or if hands are swelling) 60 tablet 5  . levothyroxine (SYNTHROID, LEVOTHROID) 75 MCG tablet Take 75 mcg by mouth daily before breakfast.    . metoprolol tartrate (LOPRESSOR) 25 MG tablet Take 0.5 tablets (12.5 mg total) by mouth 2 (two) times daily. 30 tablet 0  . XARELTO 20 MG TABS tablet TAKE 1 TABLET BY MOUTH EVERY DAY PER DR TURNER (Patient taking differently: TAKE 1 TABLET BY MOUTH DAILY AT BEDTIME (PER DR TURNER)) 30 tablet 4  . zolpidem (AMBIEN) 5 MG tablet Take 2.5 mg by mouth at bedtime as needed for sleep.     . nitroGLYCERIN (NITROSTAT) 0.4 MG SL tablet Place 1 tablet (0.4 mg total) under the tongue every 5 (five) minutes as needed for chest pain. (Patient not taking: Reported on 11/08/2014) 25 tablet 3   No current facility-administered medications for this encounter.    Allergies  Allergen Reactions  . Amiodarone     Dyspnea - felt to be 2/2 amio lung toxicity  . Zyban [Bupropion] Itching    History   Social History  . Marital Status: Married    Spouse Name: divorced.  . Number of Children: 1  . Years of Education: N/A   Occupational History  . retired. still works for [employer].     Social History Main Topics  . Smoking status: Former Smoker -- 0.30 packs/day for 44 years    Types: Cigarettes    Quit date: 04/07/2009  . Smokeless tobacco: Never Used  . Alcohol Use: Yes     Comment: 2 to 3 glasses wine per week  . Drug Use: No  . Sexual Activity: No   Other Topics Concern  . Not on file   Social History Narrative   Pt lives alone with 2 dogs.     Family History  Problem Relation Age of Onset  . Heart disease Sister     Good Pastures Syndrome  . Gout Father   . Lung cancer Father   . Breast cancer Paternal Aunt     ROS- All systems are reviewed and negative except as per the HPI above  Physical Exam: Filed Vitals:   11/08/14 1353  BP: 120/74  Pulse: 49  Height: _0  (1.6 m)    Weight: 207 lb 6.4 oz (94.076 kg)    GEN- The patient is well appearing, alert and oriented x 3 today.   Head- normocephalic, atraumatic Eyes-  Sclera clear, conjunctiva pink Ears- hearing intact Oropharynx- clear Neck- supple, no JVP Lymph- no cervical lymphadenopathy Lungs- Clear to ausculation bilaterally, normal work of breathing Heart- Regular rate and rhythm, no murmurs, rubs or gallops, PMI not laterally displaced GI- soft, NT, ND, + BS Extremities- no clubbing,  cyanosis, or edema MS- no significant deformity or atrophy Skin- no rash or lesion Psych- euthymic mood, full affect Neuro- strength and sensation are intact  EKG- Sinus brady at 49 bpm, LVH, QTc 417 ms. Epic records reviewed  Assessment and Plan:  1. PAF Continue Multaq but take with food to help with diarrhea. Continue metoprolol Continue xarelto  2.Bradycardia Asymptomatic, pt will continue to watch by fitbit  Appointment requested  with Dr. Rayann Heman per hospital d/c instruction for further discussion for ablation   Butch Penny C. Cinch Ormond, [facility] [address] [place], [place] [address] [phone]

## 2014-11-14 ENCOUNTER — Encounter (HOSPITAL_COMMUNITY): Payer: Self-pay

## 2014-11-15 ENCOUNTER — Telehealth: Payer: Self-pay | Admitting: Internal Medicine

## 2014-11-15 NOTE — Telephone Encounter (Signed)
ROI mailed to Patient home address.

## 2014-11-21 ENCOUNTER — Other Ambulatory Visit (HOSPITAL_COMMUNITY): Payer: Self-pay | Admitting: *Deleted

## 2014-11-21 MED ORDER — DRONEDARONE HCL 400 MG PO TABS: 400.0000 mg | ORAL_TABLET | Freq: Two times a day (BID) | ORAL | Status: DC

## 2014-11-29 ENCOUNTER — Encounter: Payer: Self-pay | Admitting: Internal Medicine

## 2014-11-29 ENCOUNTER — Ambulatory Visit (INDEPENDENT_AMBULATORY_CARE_PROVIDER_SITE_OTHER): Payer: Medicare Other | Admitting: Internal Medicine

## 2014-11-29 VITALS — BP 124/70 | HR 52 | Ht 63.5 in | Wt 213.2 lb

## 2014-11-29 DIAGNOSIS — I422 Other hypertrophic cardiomyopathy: Secondary | ICD-10-CM | POA: Diagnosis not present

## 2014-11-29 DIAGNOSIS — I480 Paroxysmal atrial fibrillation: Secondary | ICD-10-CM | POA: Diagnosis not present

## 2014-11-29 DIAGNOSIS — R9431 Abnormal electrocardiogram [ECG] [EKG]: Secondary | ICD-10-CM

## 2014-11-29 DIAGNOSIS — I4892 Unspecified atrial flutter: Secondary | ICD-10-CM

## 2014-11-29 DIAGNOSIS — I10 Essential (primary) hypertension: Secondary | ICD-10-CM

## 2014-11-29 MED ORDER — DRONEDARONE HCL 400 MG PO TABS: 400.0000 mg | ORAL_TABLET | Freq: Two times a day (BID) | ORAL | Status: DC

## 2014-11-29 NOTE — Patient Instructions (Signed)
Medication Instructions:  Your physician recommends that you continue on your current medications as directed. Please refer to the Current Medication list given to you today. Take your Multaq with food  Labwork: None ordered  Testing/Procedures: None ordered  Follow-Up: Your physician recommends that you schedule a follow-up appointment in: 4 weeks with Roderic Palau, NP   Any Other Special Instructions Will Be Listed Below (If Applicable).

## 2014-11-29 NOTE — Progress Notes (Signed)
Electrophysiology Office Note   Date:  11/29/2014   ID:  Amy [name], DOB August 01, 1945, MRN [id number]  PCP:  [name], MD  Cardiologist:  Dr Radford Pax Primary Electrophysiologist: Dr Caryl Comes   Chief Complaint  Patient presents with  . Paroxysmal AFIB/AFLUTTER     History of Present Illness: Amy [name] is a 69 y.o. female who presents today for electrophysiology evaluation.   She has apical variant hypertrophic CM.  She also has severe atrial enlargement by MRI.  She has failed medical therapy with amiodarone previously.  Most recently, she was hospitalized and placed on multaq.  She did not tolerate multaq due to GI side effects.  She only took this for 7 days and did not take it with food.  She was instructed to take it with food but never retried the medicine.  Costs was also a concern.  She has chronic SOB.  She has occasional palpitations.  Today, she denies symptoms of chest pain, orthopnea, PND, lower extremity edema, claudication, dizziness, presyncope, syncope, bleeding, or neurologic sequela. The patient is tolerating medications without difficulties and is otherwise without complaint today.    Past Medical History  Diagnosis Date  . Hypertension   . Hyperlipidemia   . Hypothyroid   . COPD (chronic obstructive pulmonary disease)   . Dizziness     had PT for being off balance - in approx. 2012  . Lumbar disc disease   . Macular degeneration   . Chronic diastolic heart failure   . Apical variant hypertrophic cardiomyopathy     Diagnosed by echo and ECG 11/13  . GERD (gastroesophageal reflux disease)   . Persistent atrial fibrillation        . Sinus bradycardia   . CAD (coronary artery disease)     a. LHC in 10/2011 demonstrated non-obstructive disease and an 80% lesion in a small D1 treated medically.   . OSA (obstructive sleep apnea)   . CKD (chronic kidney disease), stage III   . Pulmonary hypertension     a. RHC 05/2013 showed mild PAH with  normal PCWP and RA pressure, could be related to OSA and low oxygen saturation.   Marland Kitchen Atypical atrial flutter    Past Surgical History  Procedure Laterality Date  . Cesarean section  1983  . Cardiac catheterization      normal coronary arteries  . Cataract extraction Bilateral   . Right heart catheterization N/A 06/01/2013    Procedure: RIGHT HEART CATH;  Surgeon: Larey Dresser, MD;  Location: [facility] CATH LAB;  Service: Cardiovascular;  Laterality: N/A;  . Cardiac catheterization N/A 10/31/2014    Procedure: Left Heart Cath and Coronary Angiography;  Surgeon: Leonie Man, MD;  Location: [place] CV LAB;  Service: Cardiovascular;  Laterality: N/A;     Current Outpatient Prescriptions  Medication Sig Dispense Refill  . acetaminophen (TYLENOL) 500 MG tablet Take 500 mg by mouth daily as needed (pain). Counteract by Melaleuca    . albuterol (PROVENTIL HFA;VENTOLIN HFA) 108 (90 BASE) MCG/ACT inhaler Inhale 2 puffs into the lungs every 6 (six) hours as needed for wheezing or shortness of breath.     Marland Kitchen aspirin EC 81 MG EC tablet Take 1 tablet (81 mg total) by mouth daily. 30 tablet 0  . atorvastatin (LIPITOR) 80 MG tablet Take 1 tablet (80 mg total) by mouth daily. (Patient taking differently: Take 80 mg by mouth daily with lunch. ) 30 tablet 6  . Cholecalciferol (VITAMIN D) 2000 UNITS CAPS  Take 2,000 Units by mouth daily.     . colchicine 0.6 MG tablet Take 0.6 mg by mouth every other day.     . ezetimibe (ZETIA) 10 MG tablet Take 1 tablet (10 mg total) by mouth daily. (Patient taking differently: Take 10 mg by mouth at bedtime. ) 30 tablet 6  . furosemide (LASIX) 20 MG tablet Take 20 mg by mouth 2 (two) times daily.    Marland Kitchen levothyroxine (SYNTHROID, LEVOTHROID) 75 MCG tablet Take 75 mcg by mouth daily before breakfast.    . metoprolol tartrate (LOPRESSOR) 25 MG tablet Take 0.5 tablets (12.5 mg total) by mouth 2 (two) times daily. 30 tablet 0  . nitroGLYCERIN (NITROSTAT) 0.4 MG SL tablet Place 1  tablet (0.4 mg total) under the tongue every 5 (five) minutes as needed for chest pain. 25 tablet 3  . XARELTO 20 MG TABS tablet TAKE 1 TABLET BY MOUTH EVERY DAY PER DR TURNER (Patient taking differently: TAKE 1 TABLET BY MOUTH DAILY AT BEDTIME (PER DR TURNER)) 30 tablet 4  . zolpidem (AMBIEN) 5 MG tablet Take 2.5 mg by mouth at bedtime as needed for sleep.      No current facility-administered medications for this visit.    Allergies:   Amiodarone and Zyban   Social History:  The patient  reports that she quit smoking about 5 years ago. Her smoking use included Cigarettes. She has a 13.2 pack-year smoking history. She has never used smokeless tobacco. She reports that she drinks alcohol. She reports that she does not use illicit drugs.   Family History:  The patient's  family history includes Breast cancer in her paternal aunt; Gout in her father; Heart disease in her sister; Lung cancer in her father.    ROS:  Please see the history of present illness.   All other systems are reviewed and negative.    PHYSICAL EXAM: VS:  BP 124/70 mmHg  Pulse 52  Ht 5' 3.5" (1.613 m)  Wt 96.707 kg (213 lb 3.2 oz)  BMI 37.17 kg/m2 , BMI Body mass index is 37.17 kg/(m^2). GEN: Well nourished, well developed, in no acute distress HEENT: normal Neck: no JVD, carotid bruits, or masses Cardiac: RRR; no murmurs, rubs, or gallops,no edema  Respiratory:  clear to auscultation bilaterally, normal work of breathing GI: soft, nontender, nondistended, + BS MS: no deformity or atrophy Skin: warm and dry  Neuro:  Strength and sensation are intact Psych: euthymic mood, full affect  EKG:  EKG is ordered today. The ekg ordered today shows sinus rhythm 52 bpm, LVH with repolarization abnormality   Recent Labs: 04/21/2014: Pro B Natriuretic peptide (BNP) 86.0 06/25/2014: B Natriuretic Peptide 309.9* 10/31/2014: TSH 7.828* 11/01/2014: ALT 15; BUN 20; Creatinine, Ser 1.27*; Hemoglobin 13.9; Platelets 269; Potassium  4.0; Sodium 137    Lipid Panel     Component Value Date/Time   CHOL 137 09/27/2014 0946   TRIG 76.0 09/27/2014 0946   HDL 49.50 09/27/2014 0946   CHOLHDL 3 09/27/2014 0946   VLDL 15.2 09/27/2014 0946   LDLCALC 72 09/27/2014 0946   LDLDIRECT 115.0 06/09/2014 1449     Wt Readings from Last 3 Encounters:  11/29/14 96.707 kg (213 lb 3.2 oz)  11/08/14 94.076 kg (207 lb 6.4 oz)  11/01/14 95.754 kg (211 lb 1.6 oz)      Other studies Reviewed: Additional studies/ records that were reviewed today include: AF clinic notes, Dr Landis Gandy notes, cardiac echo/cath/mir  Review of the above records today demonstrates:  as above   ASSESSMENT AND PLAN:  1.  Persistent aifb/ atypical atrial flutter The patient has symptomatic atrial arrhythmias, likely a consequency from her hypertrophic CM and LA enlargement.  Anticipated success with ablation is reduced.  She did not take multaq as recommended in AF clinic.  Today, we discussed ablation.  She is clear that she would like to avoid this.  I have therefore advised that she comply with Donna's recommendation with multaq.  She will follow-up with Butch Penny in 4 weeks.  If she fails medical therapy, she may be more willing to consider ablation.  This can be scheduled directly from the AF clinic  2. overwight Weight loss advised  3. HTN Stable No change required today  4. Hypertrophic CM Stable No change required today   Follow-up with Butch Penny in 4 weeks and then every 57month I will see when needed going forward.  Current medicines are reviewed at length with the patient today.   The patient does not have concerns regarding her medicines.  The following changes were made today:  none   Signed, JThompson Grayer MD  11/29/2014 9:38 AM     [facility] [address] [place] [phone](office) [phone](fax)

## 2014-11-29 NOTE — Addendum Note (Signed)
Addended by: Janan Halter F on: 11/29/2014 09:52 AM   Modules accepted: Orders

## 2014-12-01 NOTE — Addendum Note (Signed)
Addended by: Freada Bergeron on: 12/01/2014 05:01 PM   Modules accepted: Orders

## 2014-12-13 DIAGNOSIS — Z23 Encounter for immunization: Secondary | ICD-10-CM | POA: Diagnosis not present

## 2014-12-13 DIAGNOSIS — F4321 Adjustment disorder with depressed mood: Secondary | ICD-10-CM | POA: Diagnosis not present

## 2014-12-13 DIAGNOSIS — Z0000 Encounter for general adult medical examination without abnormal findings: Secondary | ICD-10-CM | POA: Diagnosis not present

## 2014-12-13 DIAGNOSIS — E785 Hyperlipidemia, unspecified: Secondary | ICD-10-CM | POA: Diagnosis not present

## 2014-12-13 DIAGNOSIS — E039 Hypothyroidism, unspecified: Secondary | ICD-10-CM | POA: Diagnosis not present

## 2014-12-13 DIAGNOSIS — I10 Essential (primary) hypertension: Secondary | ICD-10-CM | POA: Diagnosis not present

## 2014-12-13 DIAGNOSIS — I480 Paroxysmal atrial fibrillation: Secondary | ICD-10-CM | POA: Diagnosis not present

## 2014-12-22 DIAGNOSIS — I4891 Unspecified atrial fibrillation: Secondary | ICD-10-CM | POA: Diagnosis not present

## 2014-12-22 DIAGNOSIS — I480 Paroxysmal atrial fibrillation: Secondary | ICD-10-CM | POA: Diagnosis not present

## 2014-12-22 DIAGNOSIS — I422 Other hypertrophic cardiomyopathy: Secondary | ICD-10-CM | POA: Diagnosis not present

## 2014-12-23 ENCOUNTER — Other Ambulatory Visit: Payer: Self-pay | Admitting: Cardiology

## 2014-12-25 ENCOUNTER — Other Ambulatory Visit: Payer: Self-pay | Admitting: Cardiology

## 2014-12-27 ENCOUNTER — Ambulatory Visit (HOSPITAL_COMMUNITY)
Admission: RE | Admit: 2014-12-27 | Discharge: 2014-12-27 | Disposition: A | Discharge status: Home / Self Care | Payer: Medicare Other | Source: Ambulatory Visit | Attending: Nurse Practitioner | Admitting: Nurse Practitioner

## 2014-12-27 ENCOUNTER — Encounter (HOSPITAL_COMMUNITY): Payer: Self-pay | Admitting: Nurse Practitioner

## 2014-12-27 VITALS — BP 128/72 | HR 50 | Ht 63.0 in | Wt 209.4 lb

## 2014-12-27 DIAGNOSIS — I481 Persistent atrial fibrillation: Secondary | ICD-10-CM | POA: Diagnosis not present

## 2014-12-27 DIAGNOSIS — R001 Bradycardia, unspecified: Secondary | ICD-10-CM | POA: Diagnosis not present

## 2014-12-27 DIAGNOSIS — I480 Paroxysmal atrial fibrillation: Secondary | ICD-10-CM | POA: Insufficient documentation

## 2014-12-27 DIAGNOSIS — I2510 Atherosclerotic heart disease of native coronary artery without angina pectoris: Secondary | ICD-10-CM | POA: Diagnosis not present

## 2014-12-27 DIAGNOSIS — I4819 Other persistent atrial fibrillation: Secondary | ICD-10-CM

## 2014-12-27 NOTE — Patient Instructions (Signed)
Parking code [id number]

## 2014-12-27 NOTE — Progress Notes (Signed)
Patient ID: Amy [name], female   DOB: [birth year], 69 y.o.   MRN: [id number]     Primary Care Physician: [name], MD Referring Physician: Dr. Roseanna Rainbow is a 69 y.o. female with a h/o paf/flutter that was recently in [facility] 2/26 for aflutter with RVR. Has has failed  amiodarone in the past . Troponin was elevated and she had LHC and was found to have moderate D1 disease to be medically managed. She was placed on multaq and d/ced home in sinus rhythm.  She had f/u with Dr. Rayann Heman who discussed ablation, although he anticipated reduced success with ablation. She did not want ablation and is having low afib burden by her report on multaq. She was seen by Dr. Nancy Fetter at [facility] for second opinion and he recommended to continue with multaq and she is wearing a 30 day event monitor and then will f/u at [facility]. She reports one day last week that she had afib for a couple of hours, once around noon and then returned past dinner. She takes extra metoprolol with breakthrough episodes. Still has some diarrhea with multaq but taking with food has helped.  Today, she denies symptoms of palpitations, chest pain, shortness of breath, orthopnea, PND, lower extremity edema, dizziness, presyncope, syncope, or neurologic sequela. Positive for diarrhea. The patient is tolerating medications without difficulties and is otherwise without complaint today.   Past Medical History  Diagnosis Date  . Hypertension   . Hyperlipidemia   . Hypothyroid   . COPD (chronic obstructive pulmonary disease)   . Dizziness     had PT for being off balance - in approx. 2012  . Lumbar disc disease   . Macular degeneration   . Chronic diastolic heart failure   . Apical variant hypertrophic cardiomyopathy     Diagnosed by echo and ECG 11/13  . GERD (gastroesophageal reflux disease)   . Persistent atrial fibrillation        . Sinus bradycardia   . CAD (coronary artery disease)     a. LHC in 10/2011  demonstrated non-obstructive disease and an 80% lesion in a small D1 treated medically.   . OSA (obstructive sleep apnea)   . CKD (chronic kidney disease), stage III   . Pulmonary hypertension     a. RHC 05/2013 showed mild PAH with normal PCWP and RA pressure, could be related to OSA and low oxygen saturation.   Marland Kitchen Atypical atrial flutter    Past Surgical History  Procedure Laterality Date  . Cesarean section  1983  . Cardiac catheterization      normal coronary arteries  . Cataract extraction Bilateral   . Right heart catheterization N/A 06/01/2013    Procedure: RIGHT HEART CATH;  Surgeon: Larey Dresser, MD;  Location: [facility] CATH LAB;  Service: Cardiovascular;  Laterality: N/A;  . Cardiac catheterization N/A 10/31/2014    Procedure: Left Heart Cath and Coronary Angiography;  Surgeon: Leonie Man, MD;  Location: [place] CV LAB;  Service: Cardiovascular;  Laterality: N/A;    Current Outpatient Prescriptions  Medication Sig Dispense Refill  . acetaminophen (TYLENOL) 500 MG tablet Take 500 mg by mouth daily as needed (pain). Counteract by Melaleuca    . albuterol (PROVENTIL HFA;VENTOLIN HFA) 108 (90 BASE) MCG/ACT inhaler Inhale 2 puffs into the lungs every 6 (six) hours as needed for wheezing or shortness of breath.     Marland Kitchen aspirin EC 81 MG EC tablet Take 1 tablet (81 mg total) by mouth  daily. 30 tablet 0  . atorvastatin (LIPITOR) 80 MG tablet Take 1 tablet (80 mg total) by mouth daily. (Patient taking differently: Take 80 mg by mouth daily with lunch. ) 30 tablet 6  . Cholecalciferol (VITAMIN D) 2000 UNITS CAPS Take 2,000 Units by mouth daily.     . colchicine 0.6 MG tablet Take 0.6 mg by mouth every other day.     . dronedarone (MULTAQ) 400 MG tablet Take 1 tablet (400 mg total) by mouth 2 (two) times daily with a meal. 60 tablet 3  . furosemide (LASIX) 20 MG tablet Take 20 mg by mouth 2 (two) times daily.    Marland Kitchen levothyroxine (SYNTHROID, LEVOTHROID) 75 MCG tablet Take 75 mcg by mouth  daily before breakfast.    . metoprolol tartrate (LOPRESSOR) 25 MG tablet Take 0.5 tablets (12.5 mg total) by mouth 2 (two) times daily. 30 tablet 0  . XARELTO 20 MG TABS tablet TAKE 1 TABLET BY MOUTH EVERY DAY PER DR TURNER (Patient taking differently: TAKE 1 TABLET BY MOUTH DAILY AT BEDTIME (PER DR TURNER)) 30 tablet 4  . ZETIA 10 MG tablet TAKE 1 TABLET (10 MG TOTAL) BY MOUTH DAILY. 30 tablet 0  . zolpidem (AMBIEN) 5 MG tablet Take 2.5 mg by mouth at bedtime as needed for sleep.     . nitroGLYCERIN (NITROSTAT) 0.4 MG SL tablet Place 1 tablet (0.4 mg total) under the tongue every 5 (five) minutes as needed for chest pain. (Patient not taking: Reported on 12/27/2014) 25 tablet 3   No current facility-administered medications for this encounter.    Allergies  Allergen Reactions  . Amiodarone     Dyspnea - felt to be 2/2 amio lung toxicity  . Zyban [Bupropion] Itching    Social History   Social History  . Marital Status: Married    Spouse Name: divorced.  . Number of Children: 1  . Years of Education: N/A   Occupational History  . retired. still works for [employer].     Social History Main Topics  . Smoking status: Former Smoker -- 0.30 packs/day for 44 years    Types: Cigarettes    Quit date: 04/07/2009  . Smokeless tobacco: Never Used  . Alcohol Use: Yes     Comment: 2 to 3 glasses wine per week  . Drug Use: No  . Sexual Activity: No   Other Topics Concern  . Not on file   Social History Narrative   Pt lives alone with 2 dogs.     Family History  Problem Relation Age of Onset  . Heart disease Sister     Good Pastures Syndrome  . Gout Father   . Lung cancer Father   . Breast cancer Paternal Aunt     ROS- All systems are reviewed and negative except as per the HPI above  Physical Exam: Filed Vitals:   12/27/14 1010  BP: 128/72  Pulse: 50  Height: _0  (1.6 m)  Weight: 209 lb 6.4 oz (94.983 kg)    GEN- The patient is well appearing, alert and oriented x 3  today.   Head- normocephalic, atraumatic Eyes-  Sclera clear, conjunctiva pink Ears- hearing intact Oropharynx- clear Neck- supple, no JVP Lymph- no cervical lymphadenopathy Lungs- Clear to ausculation bilaterally, normal work of breathing Heart- Regular rate and rhythm, no murmurs, rubs or gallops, PMI not laterally displaced GI- soft, NT, ND, + BS Extremities- no clubbing, cyanosis, or edema MS- no significant deformity or atrophy Skin- no rash  or lesion Psych- euthymic mood, full affect Neuro- strength and sensation are intact  EKG- Sinus brady at 49 bpm, LVH, QTc 417 ms. Epic records reviewed  Assessment and Plan:  1. PAF Continue Multaq but take with food to help with diarrhea. Continue metoprolol Continue xarelto Continue event monitor thru [facility] for second opinion  and f/u as scheduled there in a few weeks  2.Bradycardia Asymptomatic, pt will continue to watch by fitbit  3. CAD Stable Continue baby asa for occlusion of D1 unamendable to intervention by [facility] 7/16 Aware increases chance of bleeding, watch for signs of bleeding and report   F/U here in 3 months  Butch Penny C. Carroll, [facility] [address] [place], [place] [address] [phone]

## 2015-01-11 ENCOUNTER — Other Ambulatory Visit (HOSPITAL_COMMUNITY): Payer: Self-pay | Admitting: Nurse Practitioner

## 2015-01-14 ENCOUNTER — Other Ambulatory Visit: Payer: Self-pay | Admitting: Cardiology

## 2015-01-21 ENCOUNTER — Other Ambulatory Visit: Payer: Self-pay | Admitting: Cardiology

## 2015-01-22 DIAGNOSIS — I4891 Unspecified atrial fibrillation: Secondary | ICD-10-CM | POA: Diagnosis not present

## 2015-02-02 ENCOUNTER — Telehealth (HOSPITAL_COMMUNITY): Payer: Self-pay | Admitting: *Deleted

## 2015-02-02 NOTE — Telephone Encounter (Signed)
Patient called in stating she is starting to have more and more incidents of afib/flutter and wanting to know what her next steps should be.  This morning she woke up with afib/flutter symptoms and took extra 1/2 dose of metoprolol and her multaq and it lowered her heart rate down to around 100.  Encouraged her to use metoprolol for elevated heart rates -- Butch Penny stated she could take extra 1/2 tablet twice daily as needed for HR over 100 as long as SBP over 100.   States she went to [facility] for second opinion but they did not tell her anything differently than what she had already been told.  She is limited as to what medications she can take due to long QT and CAD.  Discussed all this with patient and Roderic Palau NP - agreed ablation would be best option. Patient requested appointment with Dr. Rayann Heman to further discuss her options. Will forward to scheduler.

## 2015-02-07 ENCOUNTER — Encounter: Payer: Self-pay | Admitting: Internal Medicine

## 2015-02-07 ENCOUNTER — Ambulatory Visit (INDEPENDENT_AMBULATORY_CARE_PROVIDER_SITE_OTHER): Payer: Medicare Other | Admitting: Internal Medicine

## 2015-02-07 VITALS — BP 120/78 | HR 54 | Ht 63.5 in | Wt 213.2 lb

## 2015-02-07 DIAGNOSIS — I10 Essential (primary) hypertension: Secondary | ICD-10-CM | POA: Diagnosis not present

## 2015-02-07 DIAGNOSIS — I422 Other hypertrophic cardiomyopathy: Secondary | ICD-10-CM | POA: Diagnosis not present

## 2015-02-07 DIAGNOSIS — I4891 Unspecified atrial fibrillation: Secondary | ICD-10-CM | POA: Diagnosis not present

## 2015-02-07 DIAGNOSIS — I4892 Unspecified atrial flutter: Secondary | ICD-10-CM

## 2015-02-07 NOTE — Patient Instructions (Signed)
Medication Instructions:  Your physician has recommended you make the following change in your medication:  1) Stop Aspirin   Labwork: None ordered   Testing/Procedures: Your physician has recommended that you have an ablation. Catheter ablation is a medical procedure used to treat some cardiac arrhythmias (irregular heartbeats). During catheter ablation, a long, thin, flexible tube is put into a blood vessel in your groin (upper thigh), or neck. This tube is called an ablation catheter. It is then guided to your heart through the blood vessel. Radio frequency waves destroy small areas of heart tissue where abnormal heartbeats may cause an arrhythmia to start. Please see the instruction sheet given to you today.  Dr Rayann Heman can do the ablation 04/10/15, 01/05, 1/10, 1/17, 1/24, 1/26, 1/31  Follow-Up: Your physician recommends that you schedule a follow-up appointment as scheduled with Roderic Palau, NP   Any Other Special Instructions Will Be Listed Below (If Applicable).     If you need a refill on your cardiac medications before your next appointment, please call your pharmacy.

## 2015-02-07 NOTE — Progress Notes (Signed)
Electrophysiology Office Note   Date:  02/07/2015   ID:  Amy [name], DOB 1945-05-25, MRN [id number]  PCP:  [name], MD  Cardiologist:  Dr Radford Pax Primary Electrophysiologist: Dr Caryl Comes   Chief Complaint  Patient presents with  . Persistent AFIB     History of Present Illness: Amy [name] is a 69 y.o. female who presents today for electrophysiology evaluation.   She has apical variant hypertrophic CM.  She also has severe atrial enlargement by MRI.  She has failed medical therapy with amiodarone previously.  Most recently, she has had further afib and atrial flutter on multaq.   She reports seeing Dr Nancy Fetter at [facility] for a second opinion regarding her arrhythmias.  SHe has been offered ablation but is not sure whether she would like to have her procedure there or at [facility].  She reports compliance with CPAP.  She does not exercise and has made no progress with weight reduction/ lifestyle modificaiton. Today, she denies symptoms of chest pain, orthopnea, PND, lower extremity edema, claudication, dizziness, presyncope, syncope, bleeding, or neurologic sequela. The patient is tolerating medications without difficulties and is otherwise without complaint today.    Past Medical History  Diagnosis Date  . Hypertension   . Hyperlipidemia   . Hypothyroid   . COPD (chronic obstructive pulmonary disease) ([place])   . Dizziness     had PT for being off balance - in approx. 2012  . Lumbar disc disease   . Macular degeneration   . Chronic diastolic heart failure ([place])   . Apical variant hypertrophic cardiomyopathy ([place])     Diagnosed by echo and ECG 11/13  . GERD (gastroesophageal reflux disease)   . Persistent atrial fibrillation ([place])        . Sinus bradycardia   . CAD (coronary artery disease)     a. LHC in 10/2011 demonstrated non-obstructive disease and an 80% lesion in a small D1 treated medically.   . OSA (obstructive sleep apnea)   . CKD (chronic kidney disease),  stage III   . Pulmonary hypertension ([place])     a. [place] 05/2013 showed mild PAH with normal PCWP and RA pressure, could be related to OSA and low oxygen saturation.   Marland Kitchen Atypical atrial flutter [facility]    Past Surgical History  Procedure Laterality Date  . Cesarean section  1983  . Cardiac catheterization      normal coronary arteries  . Cataract extraction Bilateral   . Right heart catheterization N/A 06/01/2013    Procedure: RIGHT HEART CATH;  Surgeon: Larey Dresser, MD;  Location: [facility] CATH LAB;  Service: Cardiovascular;  Laterality: N/A;  . Cardiac catheterization N/A 10/31/2014    Procedure: Left Heart Cath and Coronary Angiography;  Surgeon: Leonie Man, MD;  Location: [place] CV LAB;  Service: Cardiovascular;  Laterality: N/A;     Current Outpatient Prescriptions  Medication Sig Dispense Refill  . acetaminophen (TYLENOL) 500 MG tablet Take 500 mg by mouth daily as needed (pain). Counteract by Melaleuca    . albuterol (PROVENTIL HFA;VENTOLIN HFA) 108 (90 BASE) MCG/ACT inhaler Inhale 2 puffs into the lungs every 6 (six) hours as needed for wheezing or shortness of breath.     Marland Kitchen atorvastatin (LIPITOR) 80 MG tablet Take 1 tablet (80 mg total) by mouth daily. (Patient taking differently: Take 80 mg by mouth daily with lunch. ) 30 tablet 6  . Cholecalciferol (VITAMIN D) 2000 UNITS CAPS Take 2,000 Units by mouth daily.     Marland Kitchen  colchicine 0.6 MG tablet Take 0.6 mg by mouth every other day.     . furosemide (LASIX) 20 MG tablet Take 20 mg by mouth 2 (two) times daily.    Marland Kitchen KRILL OIL PO Take 1 capsule by mouth daily.    Marland Kitchen levothyroxine (SYNTHROID, LEVOTHROID) 75 MCG tablet Take 75 mcg by mouth daily before breakfast.    . metoprolol tartrate (LOPRESSOR) 25 MG tablet Take 0.5 tablets (12.5 mg total) by mouth 2 (two) times daily. 30 tablet 0  . MULTAQ 400 MG tablet TAKE 1 TABLET BY MOUTH TWICE A DAY WITH A MEAL 60 tablet 6  . Multiple Vitamin (MULTIVITAMIN) capsule Take 1 capsule by mouth  daily.    . nitroGLYCERIN (NITROSTAT) 0.4 MG SL tablet Place 1 tablet (0.4 mg total) under the tongue every 5 (five) minutes as needed for chest pain. 25 tablet 3  . XARELTO 20 MG TABS tablet TAKE 1 TABLET BY MOUTH EVERY DAY 30 tablet 4  . ZETIA 10 MG tablet TAKE 1 TABLET (10 MG TOTAL) BY MOUTH DAILY. 30 tablet 0  . zolpidem (AMBIEN) 5 MG tablet Take 2.5 mg by mouth at bedtime as needed for sleep.      No current facility-administered medications for this visit.    Allergies:   Amiodarone and Zyban   Social History:  The patient  reports that she quit smoking about 5 years ago. Her smoking use included Cigarettes. She has a 13.2 pack-year smoking history. She has never used smokeless tobacco. She reports that she drinks alcohol. She reports that she does not use illicit drugs.   Family History:  The patient's  family history includes Breast cancer in her paternal aunt; Gout in her father; Heart disease in her sister; Lung cancer in her father.    ROS:  Please see the history of present illness.   All other systems are reviewed and negative.    PHYSICAL EXAM: VS:  BP 120/78 mmHg  Pulse 54  Ht 5' 3.5" (1.613 m)  Wt 213 lb 3.2 oz (96.707 kg)  BMI 37.17 kg/m2 , BMI Body mass index is 37.17 kg/(m^2). GEN: Well nourished, well developed, in no acute distress HEENT: normal Neck: no JVD, carotid bruits, or masses Cardiac: RRR; no murmurs, rubs, or gallops,no edema  Respiratory:  clear to auscultation bilaterally, normal work of breathing GI: soft, nontender, nondistended, + BS MS: no deformity or atrophy Skin: warm and dry  Neuro:  Strength and sensation are intact Psych: euthymic mood, full affect  EKG:  EKG is ordered today. The ekg ordered today shows sinus rhythm 54 bpm, LVH with repolarization abnormality   Recent Labs: 04/21/2014: Pro B Natriuretic peptide (BNP) 86.0 06/25/2014: B Natriuretic Peptide 309.9* 10/31/2014: TSH 7.828* 11/01/2014: ALT 15; BUN 20; Creatinine, Ser  1.27*; Hemoglobin 13.9; Platelets 269; Potassium 4.0; Sodium 137    Lipid Panel     Component Value Date/Time   CHOL 137 09/27/2014 0946   TRIG 76.0 09/27/2014 0946   HDL 49.50 09/27/2014 0946   CHOLHDL 3 09/27/2014 0946   VLDL 15.2 09/27/2014 0946   LDLCALC 72 09/27/2014 0946   LDLDIRECT 115.0 06/09/2014 1449     Wt Readings from Last 3 Encounters:  02/07/15 213 lb 3.2 oz (96.707 kg)  12/27/14 209 lb 6.4 oz (94.983 kg)  11/29/14 213 lb 3.2 oz (96.707 kg)      Other studies Reviewed: Additional studies/ records that were reviewed today include: AF clinic notes, cardiac echo/cath/mir  Review of the  above records today demonstrates: as above   ASSESSMENT AND PLAN:  1.  Persistent aifb/ atypical atrial flutter The patient has symptomatic atrial arrhythmias, likely a consequency from her hypertrophic CM and LA enlargement.  Anticipated success with ablation is low.  Therapeutic strategies for afib including medicine and ablation were discussed in detail with the patient today. Risk, benefits, and alternatives to EP study and radiofrequency ablation for afib were also discussed in detail today. These risks include but are not limited to stroke, bleeding, vascular damage, tamponade, perforation, damage to the esophagus, lungs, and other structures, pulmonary vein stenosis, worsening renal function, and death. The patient understands these risk and wishes to think further about ablation.  She may proceed with ablation at [facility] but will contact our office if she would like to proceed here. If she decides to have her ablation at [facility], I will return her local EP care to Dr Caryl Comes.  If she decides to have her ablation done here, she is aware that she may contact our office at that time. Regardless, the importance of lifestyle modification was discussed at length today.  2. overwight Weight loss advised Body mass index is 37.17 kg/(m^2). I have reviewed the patients BMI and decreased success  rates with ablation at length today.  Weight loss is strongly advised.  Per Guijian et al (PACE 2013; 36: 371-696), patients with BMI 25-29.9 (obese) have a 27% increase in AF recurrence post ablation.  Patients with BMI >30 have a 31% increase in AF recurrence post ablation when compared to those with BMI <25.  3. HTN Stable No change required today  4. Hypertrophic CM Stable No change required today  Today, I have spent 40  minutes with the patient discussing atrial fibrillation and lifestyle modification .  More than 50% of the visit time today was spent on this issue.    Current medicines are reviewed at length with the patient today.   The patient does not have concerns regarding her medicines.  The following changes were made today:  none   Signed, [name], MD  02/07/2015 9:48 PM     [place] [address] [phone] (office) [phone] (fax)

## 2015-02-08 ENCOUNTER — Other Ambulatory Visit (HOSPITAL_COMMUNITY): Payer: Self-pay | Admitting: *Deleted

## 2015-02-08 ENCOUNTER — Other Ambulatory Visit: Payer: Self-pay | Admitting: Cardiology

## 2015-02-08 ENCOUNTER — Telehealth: Payer: Self-pay | Admitting: Internal Medicine

## 2015-02-08 DIAGNOSIS — I480 Paroxysmal atrial fibrillation: Secondary | ICD-10-CM

## 2015-02-08 MED ORDER — ATORVASTATIN CALCIUM 80 MG PO TABS: 80.0000 mg | ORAL_TABLET | Freq: Every day | ORAL | Status: DC

## 2015-02-08 NOTE — Telephone Encounter (Signed)
New message    Patient was seen in the office on yesterday and was given dates .  Has some questions on work up dates and procedures

## 2015-02-08 NOTE — Telephone Encounter (Signed)
Returned call to patient.She stated she needed to ask Dr.Allred's nurse several questions about the procedure that is to be scheduled in January.Message sent to Janan Halter RN.

## 2015-02-09 ENCOUNTER — Other Ambulatory Visit: Payer: Self-pay | Admitting: Cardiology

## 2015-02-09 MED ORDER — EZETIMIBE 10 MG PO TABS: ORAL_TABLET | ORAL | Status: DC

## 2015-02-09 NOTE — Telephone Encounter (Signed)
Called and spoke with patient and let her know that the dates given are the date of ablation.  She will need a TEE prior and be discharged the day after.  She will call back next week with the date once she decides

## 2015-02-14 ENCOUNTER — Encounter: Payer: Self-pay | Admitting: Internal Medicine

## 2015-02-14 NOTE — Telephone Encounter (Signed)
  This encounter was created in error - please disregard.

## 2015-02-14 NOTE — Telephone Encounter (Signed)
Follow Up  Pt req a call back to give dates preferred for the ablation: jan 17 procedure. Workup on the 16th of January.

## 2015-02-14 NOTE — Telephone Encounter (Signed)
New Message   Chrystal is calling to speak with RN  Needs new date for the patient appt

## 2015-02-22 ENCOUNTER — Encounter: Payer: Self-pay | Admitting: *Deleted

## 2015-02-22 ENCOUNTER — Other Ambulatory Visit: Payer: Self-pay | Admitting: *Deleted

## 2015-02-22 DIAGNOSIS — I480 Paroxysmal atrial fibrillation: Secondary | ICD-10-CM

## 2015-02-22 NOTE — Telephone Encounter (Signed)
Patient is scheduled for TEE on 04/23/15 at 10am and the ablation on 04/24/15 at 7:30am.  Her instruction sheet can be picked up the day she has her labs or at her follow up [place] with Roderic Palau, NP

## 2015-04-03 ENCOUNTER — Telehealth: Payer: Self-pay | Admitting: Cardiology

## 2015-04-03 NOTE — Telephone Encounter (Signed)
Left message for patient to call back

## 2015-04-03 NOTE — Telephone Encounter (Signed)
Pt calling to ask about discontinuing the c-pap

## 2015-04-05 ENCOUNTER — Ambulatory Visit (HOSPITAL_COMMUNITY)
Admission: RE | Admit: 2015-04-05 | Discharge: 2015-04-05 | Disposition: A | Discharge status: Home / Self Care | Payer: Medicare Other | Source: Ambulatory Visit | Attending: Nurse Practitioner | Admitting: Nurse Practitioner

## 2015-04-05 ENCOUNTER — Encounter (HOSPITAL_COMMUNITY): Payer: Self-pay | Admitting: Nurse Practitioner

## 2015-04-05 VITALS — BP 136/86 | HR 54 | Ht 63.0 in | Wt 216.8 lb

## 2015-04-05 DIAGNOSIS — I480 Paroxysmal atrial fibrillation: Secondary | ICD-10-CM | POA: Diagnosis not present

## 2015-04-05 DIAGNOSIS — R001 Bradycardia, unspecified: Secondary | ICD-10-CM | POA: Insufficient documentation

## 2015-04-05 DIAGNOSIS — I2510 Atherosclerotic heart disease of native coronary artery without angina pectoris: Secondary | ICD-10-CM | POA: Insufficient documentation

## 2015-04-05 LAB — HEPATIC FUNCTION PANEL
ALBUMIN: 3.8 g/dL (ref 3.5–5.0)
ALT: 27 U/L (ref 14–54)
AST: 22 U/L (ref 15–41)
Alkaline Phosphatase: 90 U/L (ref 38–126)
BILIRUBIN TOTAL: 0.8 mg/dL (ref 0.3–1.2)
Bilirubin, Direct: 0.2 mg/dL (ref 0.1–0.5)
Indirect Bilirubin: 0.6 mg/dL (ref 0.3–0.9)
Total Protein: 7.6 g/dL (ref 6.5–8.1)

## 2015-04-05 NOTE — Progress Notes (Signed)
Patient ID: Amy [name], female   DOB: [birth year], 69 y.o.   MRN: [id number]     Primary Care Physician: Aretta Nip, MD Referring Physician: Dr. Roseanna Rainbow is a 69 y.o. female with a h/o paf/flutter that was recently in [facility] 2/26 for aflutter with RVR. Has has failed  amiodarone in the past . Troponin was elevated and she had LHC and was found to have moderate D1 disease to be medically managed. She was placed on multaq and d/ced home in sinus rhythm.  She had f/u with Dr. Rayann Heman who discussed ablation, although he anticipated reduced success with ablation. She did not want ablation and is having low afib burden by her report on multaq. She was seen by Dr. Nancy Fetter at [facility] for second opinion and he recommended to continue with multaq and she is wearing a 30 day event monitor and then will f/u at [facility]. She reports one day last week that she had afib for a couple of hours, once around noon and then returned past dinner. She takes extra metoprolol with breakthrough episodes. Still has some diarrhea with multaq but taking with food has helped.  She saw Dr. Rayann Heman again recently and is ready to commit to afib ablation. This is scheduled for 1/17. For the most part, has had very few breakthrough episodes of afib on multaq. IN SR today. Compliant with xarelto.  Today, she denies symptoms of palpitations, chest pain, shortness of breath, orthopnea, PND, lower extremity edema, dizziness, presyncope, syncope, or neurologic sequela. Positive for diarrhea. The patient is tolerating medications without difficulties and is otherwise without complaint today.   Past Medical History  Diagnosis Date  . Hypertension   . Hyperlipidemia   . Hypothyroid   . COPD (chronic obstructive pulmonary disease) ([place])   . Dizziness     had PT for being off balance - in approx. 2012  . Lumbar disc disease   . Macular degeneration   . Chronic diastolic heart failure ([place])   . Apical variant  hypertrophic cardiomyopathy ([place])     Diagnosed by echo and ECG 11/13  . GERD (gastroesophageal reflux disease)   . Persistent atrial fibrillation ([place])        . Sinus bradycardia   . CAD (coronary artery disease)     a. LHC in 10/2011 demonstrated non-obstructive disease and an 80% lesion in a small D1 treated medically.   . OSA (obstructive sleep apnea)   . CKD (chronic kidney disease), stage III   . Pulmonary hypertension ([place])     a. [place] 05/2013 showed mild PAH with normal PCWP and RA pressure, could be related to OSA and low oxygen saturation.   Marland Kitchen Atypical atrial flutter [facility])    Past Surgical History  Procedure Laterality Date  . Cesarean section  1983  . Cardiac catheterization      normal coronary arteries  . Cataract extraction Bilateral   . Right heart catheterization N/A 06/01/2013    Procedure: RIGHT HEART CATH;  Surgeon: Larey Dresser, MD;  Location: [facility] CATH LAB;  Service: Cardiovascular;  Laterality: N/A;  . Cardiac catheterization N/A 10/31/2014    Procedure: Left Heart Cath and Coronary Angiography;  Surgeon: Leonie Man, MD;  Location: [place] CV LAB;  Service: Cardiovascular;  Laterality: N/A;    Current Outpatient Prescriptions  Medication Sig Dispense Refill  . acetaminophen (TYLENOL) 500 MG tablet Take 500 mg by mouth daily as needed (pain). Counteract by Melaleuca    .  albuterol (PROVENTIL HFA;VENTOLIN HFA) 108 (90 BASE) MCG/ACT inhaler Inhale 2 puffs into the lungs every 6 (six) hours as needed for wheezing or shortness of breath.     Marland Kitchen atorvastatin (LIPITOR) 80 MG tablet Take 1 tablet (80 mg total) by mouth daily with lunch. 90 tablet 3  . Cholecalciferol (VITAMIN D) 2000 UNITS CAPS Take 2,000 Units by mouth daily.     . colchicine 0.6 MG tablet Take 0.6 mg by mouth every other day.     . ezetimibe (ZETIA) 10 MG tablet TAKE 1 TABLET (10 MG TOTAL) BY MOUTH DAILY. 90 tablet 3  . furosemide (LASIX) 20 MG tablet Take 20 mg by mouth 2 (two) times daily.      Marland Kitchen KRILL OIL PO Take 1 capsule by mouth daily.    Marland Kitchen levothyroxine (SYNTHROID, LEVOTHROID) 75 MCG tablet Take 75 mcg by mouth daily before breakfast.    . metoprolol tartrate (LOPRESSOR) 25 MG tablet Take 0.5 tablets (12.5 mg total) by mouth 2 (two) times daily. 30 tablet 0  . MULTAQ 400 MG tablet TAKE 1 TABLET BY MOUTH TWICE A DAY WITH A MEAL 60 tablet 6  . Multiple Vitamin (MULTIVITAMIN) capsule Take 1 capsule by mouth daily.    . nitroGLYCERIN (NITROSTAT) 0.4 MG SL tablet Place 1 tablet (0.4 mg total) under the tongue every 5 (five) minutes as needed for chest pain. 25 tablet 3  . XARELTO 20 MG TABS tablet TAKE 1 TABLET BY MOUTH EVERY DAY 30 tablet 4  . zolpidem (AMBIEN) 5 MG tablet Take 2.5 mg by mouth at bedtime as needed for sleep.      No current facility-administered medications for this encounter.    Allergies  Allergen Reactions  . Amiodarone     Dyspnea - felt to be 2/2 amio lung toxicity  . Zyban [Bupropion] Itching    Social History   Social History  . Marital Status: Married    Spouse Name: divorced.  . Number of Children: 1  . Years of Education: N/A   Occupational History  . retired. still works for [employer].     Social History Main Topics  . Smoking status: Former Smoker -- 0.30 packs/day for 44 years    Types: Cigarettes    Quit date: 04/07/2009  . Smokeless tobacco: Never Used  . Alcohol Use: Yes     Comment: 2 to 3 glasses wine per week  . Drug Use: No  . Sexual Activity: No   Other Topics Concern  . Not on file   Social History Narrative   Pt lives alone with 2 dogs.     Family History  Problem Relation Age of Onset  . Heart disease Sister     Good Pastures Syndrome  . Gout Father   . Lung cancer Father   . Breast cancer Paternal Aunt     ROS- All systems are reviewed and negative except as per the HPI above  Physical Exam: Filed Vitals:   04/05/15 1353  BP: 136/86  Pulse: 54  Height: 5' 3" (1.6 m)  Weight: 216 lb 12.8 oz (98.34 kg)     GEN- The patient is well appearing, alert and oriented x 3 today.   Head- normocephalic, atraumatic Eyes-  Sclera clear, conjunctiva pink Ears- hearing intact Oropharynx- clear Neck- supple, no JVP Lymph- no cervical lymphadenopathy Lungs- Clear to ausculation bilaterally, normal work of breathing Heart- Regular rate and rhythm, no murmurs, rubs or gallops, PMI not laterally displaced GI- soft, NT, ND, +  BS Extremities- no clubbing, cyanosis, or edema MS- no significant deformity or atrophy Skin- no rash or lesion Psych- euthymic mood, full affect Neuro- strength and sensation are intact  EKG- Sinus brady at 54 bpm, LVH,  qrs int 74 ms, qtc 430 ms Epic records reviewed  Assessment and Plan:  1. PAF Continue Multaq  Liver panel today Continue metoprolol Continue xarelto Plans in place for ablation 1/17 pr Dr. Rayann Heman   2.Bradycardia Asymptomatic   3. CAD Stable Continue baby asa for occlusion of D1 unamendable to intervention by [facility] 7/16 Aware increases chance of bleeding, watch for signs of bleeding and report   F/U  In afib as needed f/u ablation  Butch Penny C. Latoia Eyster, [facility] [address] [place], [place] [address] [phone]

## 2015-04-16 ENCOUNTER — Telehealth: Payer: Self-pay | Admitting: Internal Medicine

## 2015-04-16 NOTE — Telephone Encounter (Signed)
Spoke with patient and if roads are still bad tomorrow she will reschedule her lab appointment to later in the week

## 2015-04-16 NOTE — Telephone Encounter (Signed)
New Message  Pt requested to speak w/ RN concerning her upcoming surgery. Please call back and discuss.

## 2015-04-17 ENCOUNTER — Other Ambulatory Visit: Payer: Medicare Other

## 2015-04-17 NOTE — Telephone Encounter (Signed)
Spoke with patient to see what was going on with CPAP.  She stated that she was not used to wearing it and that she did not see any benefit from it. She stated that she saw Roderic Palau, NP and that visit convinced her that she needs to continue with her CPAP therapy.  As of today, she stated that she is doing better with it and she plans to continue using the machine.  I told her to let me know if she had any other issues with it and I would try to help her the best I could. She appreciated that follow-up to check on her.

## 2015-04-17 NOTE — Addendum Note (Signed)
Addended by: Eulis Foster on: 04/17/2015 03:24 PM   Modules accepted: Orders

## 2015-04-18 ENCOUNTER — Other Ambulatory Visit (INDEPENDENT_AMBULATORY_CARE_PROVIDER_SITE_OTHER): Payer: Medicare Other | Admitting: *Deleted

## 2015-04-18 DIAGNOSIS — R001 Bradycardia, unspecified: Secondary | ICD-10-CM

## 2015-04-18 DIAGNOSIS — I422 Other hypertrophic cardiomyopathy: Secondary | ICD-10-CM

## 2015-04-18 DIAGNOSIS — I428 Other cardiomyopathies: Secondary | ICD-10-CM | POA: Diagnosis not present

## 2015-04-18 DIAGNOSIS — I498 Other specified cardiac arrhythmias: Secondary | ICD-10-CM | POA: Diagnosis not present

## 2015-04-18 DIAGNOSIS — I10 Essential (primary) hypertension: Secondary | ICD-10-CM

## 2015-04-18 LAB — BASIC METABOLIC PANEL
BUN: 21 mg/dL (ref 7–25)
CHLORIDE: 107 mmol/L (ref 98–110)
CO2: 24 mmol/L (ref 20–31)
CREATININE: 1.43 mg/dL — AB (ref 0.50–0.99)
Calcium: 9 mg/dL (ref 8.6–10.4)
Glucose, Bld: 81 mg/dL (ref 65–99)
POTASSIUM: 3.9 mmol/L (ref 3.5–5.3)
Sodium: 140 mmol/L (ref 135–146)

## 2015-04-18 LAB — CBC WITH DIFFERENTIAL/PLATELET
BASOS ABS: 0 10*3/uL (ref 0.0–0.1)
Basophils Relative: 0 % (ref 0–1)
EOS ABS: 0.2 10*3/uL (ref 0.0–0.7)
EOS PCT: 2 % (ref 0–5)
HEMATOCRIT: 42.2 % (ref 36.0–46.0)
Hemoglobin: 13.5 g/dL (ref 12.0–15.0)
LYMPHS PCT: 30 % (ref 12–46)
Lymphs Abs: 2.3 10*3/uL (ref 0.7–4.0)
MCH: 29.3 pg (ref 26.0–34.0)
MCHC: 32 g/dL (ref 30.0–36.0)
MCV: 91.7 fL (ref 78.0–100.0)
MPV: 9.6 fL (ref 8.6–12.4)
Monocytes Absolute: 0.5 10*3/uL (ref 0.1–1.0)
Monocytes Relative: 7 % (ref 3–12)
Neutro Abs: 4.8 10*3/uL (ref 1.7–7.7)
Neutrophils Relative %: 61 % (ref 43–77)
PLATELETS: 333 10*3/uL (ref 150–400)
RBC: 4.6 MIL/uL (ref 3.87–5.11)
RDW: 17.5 % — AB (ref 11.5–15.5)
WBC: 7.8 10*3/uL (ref 4.0–10.5)

## 2015-04-18 NOTE — Addendum Note (Signed)
Addended by: Eulis Foster on: 04/18/2015 12:56 PM   Modules accepted: Orders

## 2015-04-23 ENCOUNTER — Ambulatory Visit (HOSPITAL_COMMUNITY)
Admission: RE | Admit: 2015-04-23 | Discharge: 2015-04-23 | Disposition: A | Discharge status: Home / Self Care | Payer: Medicare Other | Source: Ambulatory Visit | Attending: Cardiovascular Disease | Admitting: Cardiovascular Disease

## 2015-04-23 ENCOUNTER — Ambulatory Visit (HOSPITAL_BASED_OUTPATIENT_CLINIC_OR_DEPARTMENT_OTHER)
Admission: RE | Admit: 2015-04-23 | Discharge: 2015-04-23 | Disposition: A | Discharge status: Home / Self Care | Payer: Medicare Other | Source: Ambulatory Visit | Attending: Internal Medicine | Admitting: Internal Medicine

## 2015-04-23 ENCOUNTER — Encounter (HOSPITAL_COMMUNITY)
Admission: RE | Disposition: A | Discharge status: Home / Self Care | Payer: Self-pay | Source: Ambulatory Visit | Attending: Cardiovascular Disease

## 2015-04-23 ENCOUNTER — Encounter (HOSPITAL_COMMUNITY): Payer: Self-pay

## 2015-04-23 DIAGNOSIS — I4892 Unspecified atrial flutter: Secondary | ICD-10-CM | POA: Insufficient documentation

## 2015-04-23 DIAGNOSIS — I480 Paroxysmal atrial fibrillation: Secondary | ICD-10-CM | POA: Diagnosis not present

## 2015-04-23 DIAGNOSIS — N183 Chronic kidney disease, stage 3 (moderate): Secondary | ICD-10-CM | POA: Insufficient documentation

## 2015-04-23 DIAGNOSIS — I4891 Unspecified atrial fibrillation: Secondary | ICD-10-CM | POA: Diagnosis not present

## 2015-04-23 DIAGNOSIS — I5032 Chronic diastolic (congestive) heart failure: Secondary | ICD-10-CM | POA: Insufficient documentation

## 2015-04-23 DIAGNOSIS — J449 Chronic obstructive pulmonary disease, unspecified: Secondary | ICD-10-CM | POA: Insufficient documentation

## 2015-04-23 DIAGNOSIS — I129 Hypertensive chronic kidney disease with stage 1 through stage 4 chronic kidney disease, or unspecified chronic kidney disease: Secondary | ICD-10-CM | POA: Insufficient documentation

## 2015-04-23 DIAGNOSIS — R001 Bradycardia, unspecified: Secondary | ICD-10-CM | POA: Insufficient documentation

## 2015-04-23 DIAGNOSIS — Z7901 Long term (current) use of anticoagulants: Secondary | ICD-10-CM | POA: Diagnosis not present

## 2015-04-23 DIAGNOSIS — I2510 Atherosclerotic heart disease of native coronary artery without angina pectoris: Secondary | ICD-10-CM | POA: Insufficient documentation

## 2015-04-23 DIAGNOSIS — E785 Hyperlipidemia, unspecified: Secondary | ICD-10-CM | POA: Insufficient documentation

## 2015-04-23 DIAGNOSIS — I351 Nonrheumatic aortic (valve) insufficiency: Secondary | ICD-10-CM

## 2015-04-23 DIAGNOSIS — E039 Hypothyroidism, unspecified: Secondary | ICD-10-CM | POA: Insufficient documentation

## 2015-04-23 DIAGNOSIS — Z87891 Personal history of nicotine dependence: Secondary | ICD-10-CM | POA: Diagnosis not present

## 2015-04-23 DIAGNOSIS — I252 Old myocardial infarction: Secondary | ICD-10-CM | POA: Diagnosis not present

## 2015-04-23 DIAGNOSIS — I10 Essential (primary) hypertension: Secondary | ICD-10-CM | POA: Diagnosis not present

## 2015-04-23 DIAGNOSIS — Z01818 Encounter for other preprocedural examination: Secondary | ICD-10-CM | POA: Insufficient documentation

## 2015-04-23 HISTORY — PX: TEE WITHOUT CARDIOVERSION: SHX5443

## 2015-04-23 SURGERY — ECHOCARDIOGRAM, TRANSESOPHAGEAL
Anesthesia: Moderate Sedation

## 2015-04-23 MED ORDER — SODIUM CHLORIDE 0.9 % IV SOLN: INTRAVENOUS | Status: DC

## 2015-04-23 MED ORDER — FENTANYL CITRATE (PF) 100 MCG/2ML IJ SOLN
INTRAMUSCULAR | Status: DC | PRN
  Administered 2015-04-23 (×2): 25 ug via INTRAVENOUS

## 2015-04-23 MED ORDER — MIDAZOLAM HCL 10 MG/2ML IJ SOLN
INTRAMUSCULAR | Status: DC | PRN
  Administered 2015-04-23 (×2): 2 mg via INTRAVENOUS

## 2015-04-23 MED ORDER — MIDAZOLAM HCL 5 MG/ML IJ SOLN
INTRAMUSCULAR | Status: AC
  Filled 2015-04-23: qty 2

## 2015-04-23 MED ORDER — DIPHENHYDRAMINE HCL 50 MG/ML IJ SOLN
INTRAMUSCULAR | Status: AC
  Filled 2015-04-23: qty 1

## 2015-04-23 MED ORDER — FENTANYL CITRATE (PF) 100 MCG/2ML IJ SOLN
INTRAMUSCULAR | Status: AC
  Filled 2015-04-23: qty 2

## 2015-04-23 MED ORDER — BUTAMBEN-TETRACAINE-BENZOCAINE 2-2-14 % EX AERO
INHALATION_SPRAY | CUTANEOUS | Status: DC | PRN
  Administered 2015-04-23: 2 via TOPICAL

## 2015-04-23 NOTE — Interval H&P Note (Signed)
History and Physical Interval Note:  04/23/2015 8:08 AM  Rise Paganini A   has presented today for surgery, with the diagnosis of AFIB  The various methods of treatment have been discussed with the patient and family. After consideration of risks, benefits and other options for treatment, the patient has consented to  Procedure(s): TRANSESOPHAGEAL ECHOCARDIOGRAM (TEE) (N/A) as a surgical intervention .  The patient's history has been reviewed, patient examined, no change in status, stable for surgery.  I have reviewed the patient's chart and labs.  Questions were answered to the patient's satisfaction.     Jenkins Rouge

## 2015-04-23 NOTE — H&P (View-Only) (Signed)
Patient ID: Amy [name], female   DOB: [birth year], 70 y.o.   MRN: [id number]     Primary Care Physician: Aretta Nip, MD Referring Physician: Dr. Roseanna Rainbow is a 70 y.o. female with a h/o paf/flutter that was recently in [facility] 2/26 for aflutter with RVR. Has has failed  amiodarone in the past . Troponin was elevated and she had LHC and was found to have moderate D1 disease to be medically managed. She was placed on multaq and d/ced home in sinus rhythm.  She had f/u with Dr. Rayann Heman who discussed ablation, although he anticipated reduced success with ablation. She did not want ablation and is having low afib burden by her report on multaq. She was seen by Dr. Nancy Fetter at [facility] for second opinion and he recommended to continue with multaq and she is wearing a 30 day event monitor and then will f/u at [facility]. She reports one day last week that she had afib for a couple of hours, once around noon and then returned past dinner. She takes extra metoprolol with breakthrough episodes. Still has some diarrhea with multaq but taking with food has helped.  She saw Dr. Rayann Heman again recently and is ready to commit to afib ablation. This is scheduled for 1/17. For the most part, has had very few breakthrough episodes of afib on multaq. IN SR today. Compliant with xarelto.  Today, she denies symptoms of palpitations, chest pain, shortness of breath, orthopnea, PND, lower extremity edema, dizziness, presyncope, syncope, or neurologic sequela. Positive for diarrhea. The patient is tolerating medications without difficulties and is otherwise without complaint today.   Past Medical History  Diagnosis Date  . Hypertension   . Hyperlipidemia   . Hypothyroid   . COPD (chronic obstructive pulmonary disease) ([place])   . Dizziness     had PT for being off balance - in approx. 2012  . Lumbar disc disease   . Macular degeneration   . Chronic diastolic heart failure ([place])   . Apical variant  hypertrophic cardiomyopathy ([place])     Diagnosed by echo and ECG 11/13  . GERD (gastroesophageal reflux disease)   . Persistent atrial fibrillation ([place])        . Sinus bradycardia   . CAD (coronary artery disease)     a. LHC in 10/2011 demonstrated non-obstructive disease and an 80% lesion in a small D1 treated medically.   . OSA (obstructive sleep apnea)   . CKD (chronic kidney disease), stage III   . Pulmonary hypertension ([place])     a. [place] 05/2013 showed mild PAH with normal PCWP and RA pressure, could be related to OSA and low oxygen saturation.   Marland Kitchen Atypical atrial flutter [facility])    Past Surgical History  Procedure Laterality Date  . Cesarean section  1983  . Cardiac catheterization      normal coronary arteries  . Cataract extraction Bilateral   . Right heart catheterization N/A 06/01/2013    Procedure: RIGHT HEART CATH;  Surgeon: Larey Dresser, MD;  Location: [facility] CATH LAB;  Service: Cardiovascular;  Laterality: N/A;  . Cardiac catheterization N/A 10/31/2014    Procedure: Left Heart Cath and Coronary Angiography;  Surgeon: Leonie Man, MD;  Location: [place] CV LAB;  Service: Cardiovascular;  Laterality: N/A;    Current Outpatient Prescriptions  Medication Sig Dispense Refill  . acetaminophen (TYLENOL) 500 MG tablet Take 500 mg by mouth daily as needed (pain). Counteract by Melaleuca    .  albuterol (PROVENTIL HFA;VENTOLIN HFA) 108 (90 BASE) MCG/ACT inhaler Inhale 2 puffs into the lungs every 6 (six) hours as needed for wheezing or shortness of breath.     Marland Kitchen atorvastatin (LIPITOR) 80 MG tablet Take 1 tablet (80 mg total) by mouth daily with lunch. 90 tablet 3  . Cholecalciferol (VITAMIN D) 2000 UNITS CAPS Take 2,000 Units by mouth daily.     . colchicine 0.6 MG tablet Take 0.6 mg by mouth every other day.     . ezetimibe (ZETIA) 10 MG tablet TAKE 1 TABLET (10 MG TOTAL) BY MOUTH DAILY. 90 tablet 3  . furosemide (LASIX) 20 MG tablet Take 20 mg by mouth 2 (two) times daily.      Marland Kitchen KRILL OIL PO Take 1 capsule by mouth daily.    Marland Kitchen levothyroxine (SYNTHROID, LEVOTHROID) 75 MCG tablet Take 75 mcg by mouth daily before breakfast.    . metoprolol tartrate (LOPRESSOR) 25 MG tablet Take 0.5 tablets (12.5 mg total) by mouth 2 (two) times daily. 30 tablet 0  . MULTAQ 400 MG tablet TAKE 1 TABLET BY MOUTH TWICE A DAY WITH A MEAL 60 tablet 6  . Multiple Vitamin (MULTIVITAMIN) capsule Take 1 capsule by mouth daily.    . nitroGLYCERIN (NITROSTAT) 0.4 MG SL tablet Place 1 tablet (0.4 mg total) under the tongue every 5 (five) minutes as needed for chest pain. 25 tablet 3  . XARELTO 20 MG TABS tablet TAKE 1 TABLET BY MOUTH EVERY DAY 30 tablet 4  . zolpidem (AMBIEN) 5 MG tablet Take 2.5 mg by mouth at bedtime as needed for sleep.      No current facility-administered medications for this encounter.    Allergies  Allergen Reactions  . Amiodarone     Dyspnea - felt to be 2/2 amio lung toxicity  . Zyban [Bupropion] Itching    Social History   Social History  . Marital Status: Married    Spouse Name: divorced.  . Number of Children: 1  . Years of Education: N/A   Occupational History  . retired. still works for [employer].     Social History Main Topics  . Smoking status: Former Smoker -- 0.30 packs/day for 44 years    Types: Cigarettes    Quit date: 04/07/2009  . Smokeless tobacco: Never Used  . Alcohol Use: Yes     Comment: 2 to 3 glasses wine per week  . Drug Use: No  . Sexual Activity: No   Other Topics Concern  . Not on file   Social History Narrative   Pt lives alone with 2 dogs.     Family History  Problem Relation Age of Onset  . Heart disease Sister     Good Pastures Syndrome  . Gout Father   . Lung cancer Father   . Breast cancer Paternal Aunt     ROS- All systems are reviewed and negative except as per the HPI above  Physical Exam: Filed Vitals:   04/05/15 1353  BP: 136/86  Pulse: 54  Height: 5' 3" (1.6 m)  Weight: 216 lb 12.8 oz (98.34 kg)     GEN- The patient is well appearing, alert and oriented x 3 today.   Head- normocephalic, atraumatic Eyes-  Sclera clear, conjunctiva pink Ears- hearing intact Oropharynx- clear Neck- supple, no JVP Lymph- no cervical lymphadenopathy Lungs- Clear to ausculation bilaterally, normal work of breathing Heart- Regular rate and rhythm, no murmurs, rubs or gallops, PMI not laterally displaced GI- soft, NT, ND, +  BS Extremities- no clubbing, cyanosis, or edema MS- no significant deformity or atrophy Skin- no rash or lesion Psych- euthymic mood, full affect Neuro- strength and sensation are intact  EKG- Sinus brady at 54 bpm, LVH,  qrs int 74 ms, qtc 430 ms Epic records reviewed  Assessment and Plan:  1. PAF Continue Multaq  Liver panel today Continue metoprolol Continue xarelto Plans in place for ablation 1/17 pr Dr. Rayann Heman   2.Bradycardia Asymptomatic   3. CAD Stable Continue baby asa for occlusion of D1 unamendable to intervention by [facility] 7/16 Aware increases chance of bleeding, watch for signs of bleeding and report   F/U  In afib as needed f/u ablation  Butch Penny C. Carroll, [facility] [address] [place], [place] [address] [phone]

## 2015-04-23 NOTE — CV Procedure (Signed)
TEE:  4 mg vesed 25 ug fentanyl  Normal EF 60% Dense smoke in LA and LAA but no thrombus No PFO Moderate LAE MIld MR AV sclerosis no stenosis Normal RV No effusion No significant aortic debris Mild TR  Jenkins Rouge

## 2015-04-23 NOTE — Discharge Instructions (Signed)

## 2015-04-23 NOTE — Progress Notes (Signed)
  Echocardiogram Echocardiogram Transesophageal has been performed.  Bobbye Charleston 04/23/2015, 10:01 AM

## 2015-04-24 ENCOUNTER — Ambulatory Visit (HOSPITAL_COMMUNITY)
Admission: RE | Admit: 2015-04-24 | Discharge: 2015-04-25 | Disposition: A | Discharge status: Home / Self Care | Payer: Medicare Other | Source: Ambulatory Visit | Attending: Internal Medicine | Admitting: Internal Medicine

## 2015-04-24 ENCOUNTER — Ambulatory Visit (HOSPITAL_COMMUNITY): Payer: Medicare Other | Admitting: Anesthesiology

## 2015-04-24 ENCOUNTER — Encounter (HOSPITAL_COMMUNITY): Payer: Self-pay | Admitting: Certified Registered"

## 2015-04-24 ENCOUNTER — Encounter (HOSPITAL_COMMUNITY)
Admission: RE | Disposition: A | Discharge status: Home / Self Care | Payer: Self-pay | Source: Ambulatory Visit | Attending: Internal Medicine

## 2015-04-24 DIAGNOSIS — I2510 Atherosclerotic heart disease of native coronary artery without angina pectoris: Secondary | ICD-10-CM | POA: Diagnosis not present

## 2015-04-24 DIAGNOSIS — I483 Typical atrial flutter: Secondary | ICD-10-CM | POA: Insufficient documentation

## 2015-04-24 DIAGNOSIS — I272 Other secondary pulmonary hypertension: Secondary | ICD-10-CM | POA: Diagnosis not present

## 2015-04-24 DIAGNOSIS — G4733 Obstructive sleep apnea (adult) (pediatric): Secondary | ICD-10-CM | POA: Diagnosis not present

## 2015-04-24 DIAGNOSIS — R001 Bradycardia, unspecified: Secondary | ICD-10-CM | POA: Insufficient documentation

## 2015-04-24 DIAGNOSIS — K219 Gastro-esophageal reflux disease without esophagitis: Secondary | ICD-10-CM | POA: Diagnosis not present

## 2015-04-24 DIAGNOSIS — I130 Hypertensive heart and chronic kidney disease with heart failure and stage 1 through stage 4 chronic kidney disease, or unspecified chronic kidney disease: Secondary | ICD-10-CM | POA: Diagnosis not present

## 2015-04-24 DIAGNOSIS — N183 Chronic kidney disease, stage 3 (moderate): Secondary | ICD-10-CM | POA: Diagnosis not present

## 2015-04-24 DIAGNOSIS — J449 Chronic obstructive pulmonary disease, unspecified: Secondary | ICD-10-CM | POA: Insufficient documentation

## 2015-04-24 DIAGNOSIS — Z7901 Long term (current) use of anticoagulants: Secondary | ICD-10-CM | POA: Diagnosis not present

## 2015-04-24 DIAGNOSIS — E039 Hypothyroidism, unspecified: Secondary | ICD-10-CM | POA: Insufficient documentation

## 2015-04-24 DIAGNOSIS — I422 Other hypertrophic cardiomyopathy: Secondary | ICD-10-CM | POA: Insufficient documentation

## 2015-04-24 DIAGNOSIS — I484 Atypical atrial flutter: Secondary | ICD-10-CM | POA: Insufficient documentation

## 2015-04-24 DIAGNOSIS — I5032 Chronic diastolic (congestive) heart failure: Secondary | ICD-10-CM | POA: Insufficient documentation

## 2015-04-24 DIAGNOSIS — I480 Paroxysmal atrial fibrillation: Secondary | ICD-10-CM | POA: Diagnosis not present

## 2015-04-24 DIAGNOSIS — I4891 Unspecified atrial fibrillation: Secondary | ICD-10-CM | POA: Diagnosis not present

## 2015-04-24 DIAGNOSIS — Z87891 Personal history of nicotine dependence: Secondary | ICD-10-CM | POA: Diagnosis not present

## 2015-04-24 DIAGNOSIS — I481 Persistent atrial fibrillation: Secondary | ICD-10-CM | POA: Diagnosis present

## 2015-04-24 DIAGNOSIS — I4892 Unspecified atrial flutter: Secondary | ICD-10-CM | POA: Diagnosis present

## 2015-04-24 DIAGNOSIS — Z6837 Body mass index (BMI) 37.0-37.9, adult: Secondary | ICD-10-CM | POA: Insufficient documentation

## 2015-04-24 DIAGNOSIS — H3530 Unspecified macular degeneration: Secondary | ICD-10-CM | POA: Insufficient documentation

## 2015-04-24 DIAGNOSIS — E785 Hyperlipidemia, unspecified: Secondary | ICD-10-CM | POA: Insufficient documentation

## 2015-04-24 HISTORY — DX: Procedure and treatment not carried out because of patient's decision for reasons of belief and group pressure: Z53.1

## 2015-04-24 HISTORY — PX: ELECTROPHYSIOLOGIC STUDY: SHX172A

## 2015-04-24 HISTORY — DX: Reserved for inherently not codable concepts without codable children: IMO0001

## 2015-04-24 LAB — POCT ACTIVATED CLOTTING TIME
ACTIVATED CLOTTING TIME: 168 s
Activated Clotting Time: 353 seconds
Activated Clotting Time: 302 seconds
Activated Clotting Time: 307 seconds

## 2015-04-24 SURGERY — ATRIAL FIBRILLATION ABLATION
Anesthesia: Monitor Anesthesia Care

## 2015-04-24 MED ORDER — FUROSEMIDE 20 MG PO TABS
20.0000 mg | ORAL_TABLET | Freq: Two times a day (BID) | ORAL | Status: DC
  Administered 2015-04-25: 20 mg via ORAL
  Filled 2015-04-24: qty 1

## 2015-04-24 MED ORDER — HEPARIN SODIUM (PORCINE) 1000 UNIT/ML IJ SOLN
INTRAMUSCULAR | Status: DC | PRN
  Administered 2015-04-24: 12000 [IU] via INTRAVENOUS
  Administered 2015-04-24: 1000 [IU] via INTRAVENOUS

## 2015-04-24 MED ORDER — FENTANYL CITRATE (PF) 250 MCG/5ML IJ SOLN
INTRAMUSCULAR | Status: DC | PRN
  Administered 2015-04-24 (×5): 25 ug via INTRAVENOUS

## 2015-04-24 MED ORDER — PROTAMINE SULFATE 10 MG/ML IV SOLN
INTRAVENOUS | Status: DC | PRN
  Administered 2015-04-24: 30 mg via INTRAVENOUS

## 2015-04-24 MED ORDER — SODIUM CHLORIDE 0.9 % IV SOLN: 250.0000 mL | INTRAVENOUS | Status: DC | PRN

## 2015-04-24 MED ORDER — BUPIVACAINE HCL (PF) 0.25 % IJ SOLN
INTRAMUSCULAR | Status: DC | PRN
  Administered 2015-04-24: 15 mL

## 2015-04-24 MED ORDER — EPHEDRINE SULFATE 50 MG/ML IJ SOLN
INTRAMUSCULAR | Status: DC | PRN
  Administered 2015-04-24: 10 mg via INTRAVENOUS

## 2015-04-24 MED ORDER — PHENYLEPHRINE HCL 10 MG/ML IJ SOLN
INTRAMUSCULAR | Status: DC | PRN
  Administered 2015-04-24 (×2): 80 ug via INTRAVENOUS

## 2015-04-24 MED ORDER — LEVOTHYROXINE SODIUM 75 MCG PO TABS
75.0000 ug | ORAL_TABLET | Freq: Every day | ORAL | Status: DC
  Administered 2015-04-25: 75 ug via ORAL
  Filled 2015-04-24: qty 1

## 2015-04-24 MED ORDER — BUPIVACAINE HCL (PF) 0.25 % IJ SOLN
INTRAMUSCULAR | Status: AC
  Filled 2015-04-24: qty 30

## 2015-04-24 MED ORDER — RIVAROXABAN 20 MG PO TABS
20.0000 mg | ORAL_TABLET | Freq: Every day | ORAL | Status: DC
  Administered 2015-04-24: 20 mg via ORAL
  Filled 2015-04-24 (×2): qty 1

## 2015-04-24 MED ORDER — ACETAMINOPHEN 325 MG PO TABS
650.0000 mg | ORAL_TABLET | ORAL | Status: DC | PRN
  Administered 2015-04-24: 650 mg via ORAL

## 2015-04-24 MED ORDER — LIDOCAINE HCL (CARDIAC) 20 MG/ML IV SOLN
INTRAVENOUS | Status: DC | PRN
  Administered 2015-04-24: 100 mg via INTRAVENOUS

## 2015-04-24 MED ORDER — ONDANSETRON HCL 4 MG/2ML IJ SOLN
INTRAMUSCULAR | Status: DC | PRN
  Administered 2015-04-24: 4 mg via INTRAVENOUS

## 2015-04-24 MED ORDER — SODIUM CHLORIDE 0.9 % IV SOLN
INTRAVENOUS | Status: DC
  Administered 2015-04-24 (×2): via INTRAVENOUS

## 2015-04-24 MED ORDER — IOHEXOL 350 MG/ML SOLN
INTRAVENOUS | Status: DC | PRN
  Administered 2015-04-24: 3 mL via INTRACARDIAC
  Administered 2015-04-24: 100 mL via INTRACARDIAC

## 2015-04-24 MED ORDER — ONDANSETRON HCL 4 MG/2ML IJ SOLN: 4.0000 mg | Freq: Four times a day (QID) | INTRAMUSCULAR | Status: DC | PRN

## 2015-04-24 MED ORDER — SODIUM CHLORIDE 0.9 % IJ SOLN: 3.0000 mL | INTRAMUSCULAR | Status: DC | PRN

## 2015-04-24 MED ORDER — ACETAMINOPHEN-CODEINE #3 300-30 MG PO TABS
ORAL_TABLET | ORAL | Status: AC
  Filled 2015-04-24: qty 2

## 2015-04-24 MED ORDER — HYDROCODONE-ACETAMINOPHEN 5-325 MG PO TABS: 1.0000 | ORAL_TABLET | ORAL | Status: DC | PRN

## 2015-04-24 MED ORDER — METOPROLOL TARTRATE 12.5 MG HALF TABLET: 12.5000 mg | ORAL_TABLET | Freq: Two times a day (BID) | ORAL | Status: DC

## 2015-04-24 MED ORDER — HEPARIN SODIUM (PORCINE) 1000 UNIT/ML IJ SOLN
INTRAMUSCULAR | Status: DC | PRN
  Administered 2015-04-24: 1000 [IU] via INTRAVENOUS

## 2015-04-24 MED ORDER — ZOLPIDEM TARTRATE 5 MG PO TABS: 2.5000 mg | ORAL_TABLET | Freq: Every evening | ORAL | Status: DC | PRN

## 2015-04-24 MED ORDER — HEPARIN SODIUM (PORCINE) 1000 UNIT/ML IJ SOLN
INTRAMUSCULAR | Status: AC
  Filled 2015-04-24: qty 1

## 2015-04-24 MED ORDER — SODIUM CHLORIDE 0.9 % IJ SOLN
3.0000 mL | Freq: Two times a day (BID) | INTRAMUSCULAR | Status: DC
  Administered 2015-04-24: 3 mL via INTRAVENOUS

## 2015-04-24 MED ORDER — PROPOFOL 10 MG/ML IV BOLUS
INTRAVENOUS | Status: DC | PRN
  Administered 2015-04-24: 50 mg via INTRAVENOUS
  Administered 2015-04-24: 170 mg via INTRAVENOUS

## 2015-04-24 SURGICAL SUPPLY — 21 items
BAG SNAP BAND KOVER 36X36 (MISCELLANEOUS) ×3 IMPLANT
BLANKET WARM UNDERBOD FULL ACC (MISCELLANEOUS) ×3 IMPLANT
CATH DIAG 6FR PIGTAIL (CATHETERS) ×3 IMPLANT
CATH NAVISTAR SMARTTOUCH DF (ABLATOR) ×3 IMPLANT
CATH SOUNDSTAR 3D IMAGING (CATHETERS) ×3 IMPLANT
CATH VARIABLE LASSO NAV 2515 (CATHETERS) ×3 IMPLANT
CATH WEBSTER BI DIR CS D-F CRV (CATHETERS) ×3 IMPLANT
COVER SWIFTLINK CONNECTOR (BAG) ×3 IMPLANT
NEEDLE TRANSEP BRK 71CM 407200 (NEEDLE) ×3 IMPLANT
PACK EP LATEX FREE (CUSTOM PROCEDURE TRAY) ×2
PACK EP LF (CUSTOM PROCEDURE TRAY) ×1 IMPLANT
PAD DEFIB LIFELINK (PAD) ×3 IMPLANT
PATCH CARTO3 (PAD) ×3 IMPLANT
SHEATH AVANTI 11F 11CM (SHEATH) ×3 IMPLANT
SHEATH PINNACLE 7F 10CM (SHEATH) ×6 IMPLANT
SHEATH PINNACLE 9F 10CM (SHEATH) ×3 IMPLANT
SHEATH SWARTZ TS SL2 63CM 8.5F (SHEATH) ×3 IMPLANT
SHIELD RADPAD SCOOP 12X17 (MISCELLANEOUS) ×3 IMPLANT
SYR MEDRAD MARK V 150ML (SYRINGE) ×3 IMPLANT
TUBING CONTRAST HIGH PRESS 48 (TUBING) ×3 IMPLANT
TUBING SMART ABLATE COOLFLOW (TUBING) ×3 IMPLANT

## 2015-04-24 NOTE — Progress Notes (Signed)
Pt quietly and patiently awaiting bed assignment.  Continued to monitor and check groin PRN.

## 2015-04-24 NOTE — Anesthesia Preprocedure Evaluation (Addendum)
Anesthesia Evaluation  Patient identified by MRN, date of birth, ID band Patient awake    Reviewed: Allergy & Precautions, NPO status , Patient's Chart, lab work & pertinent test results  History of Anesthesia Complications Negative for: history of anesthetic complications  Airway Mallampati: I  TM Distance: >3 FB Neck ROM: Full    Dental  (+) Teeth Intact   Pulmonary shortness of breath, sleep apnea , COPD, former smoker,    breath sounds clear to auscultation       Cardiovascular hypertension, + CAD, +CHF and + DOE  (-) Past MI  Rhythm:Regular Rate:Bradycardia     Neuro/Psych    GI/Hepatic GERD  ,  Endo/Other  Hypothyroidism Morbid obesity  Renal/GU Renal disease     Musculoskeletal   Abdominal (+) + obese,   Peds  Hematology   Anesthesia Other Findings   Reproductive/Obstetrics                            Anesthesia Physical Anesthesia Plan  ASA: III  Anesthesia Plan: MAC and General   Post-op Pain Management:    Induction:   Airway Management Planned: LMA  Additional Equipment:   Intra-op Plan:   Post-operative Plan: Extubation in OR  Informed Consent:   Plan Discussed with:   Anesthesia Plan Comments:        Anesthesia Quick Evaluation

## 2015-04-24 NOTE — Interval H&P Note (Signed)
History and Physical Interval Note:  04/24/2015 7:34 AM  Amy [name]  has presented today for surgery, with the diagnosis of afib  The various methods of treatment have been discussed with the patient and family. After consideration of risks, benefits and other options for treatment, the patient has consented to  Procedure(s): Atrial Fibrillation Ablation (N/A) as a surgical intervention .  The patient's history has been reviewed, patient examined, no change in status, stable for surgery.  I have reviewed the patient's chart and labs.  Questions were answered to the patient's satisfaction.     Therapeutic strategies for afib including medicine and ablation were discussed in detail with the patient today. Risk, benefits, and alternatives to EP study and radiofrequency ablation were also discussed in detail today. These risks include but are not limited to stroke, bleeding, vascular damage, tamponade, perforation, damage to the esophagus, lungs, and other structures, pulmonary vein stenosis, worsening renal function, and death. The patient understands these risk and wishes to proceed.   She is in sinus rhythm today.  TEE from yesterday is reviewed.  Reports compliance with xarelto without interruption for at least 3 weeks.  As she has been on multaq and Creatinine is borderline, single dose of xarelto held last evening.   [name]

## 2015-04-24 NOTE — Progress Notes (Signed)
RT placed patient on CPAP of 8. No O2 bleed in needed. Patient tolerating well. RT will continue to monitor as needed.

## 2015-04-24 NOTE — Transfer of Care (Signed)
Immediate Anesthesia Transfer of Care Note  Patient: Amy [name]  Procedure(s) Performed: Procedure(s): Atrial Fibrillation Ablation (N/A)  Patient Location: ICU and Cath Lab  Anesthesia Type:General  Level of Consciousness: awake, alert , oriented and patient cooperative  Airway & Oxygen Therapy: Patient Spontanous Breathing and Patient connected to nasal cannula oxygen  Post-op Assessment: Report given to RN, Post -op Vital signs reviewed and stable and Patient moving all extremities  Post vital signs: Reviewed and stable  Last Vitals:  Filed Vitals:   04/24/15 0546  BP: 141/93  Pulse: 56  Temp: 36.8 C  Resp: 16    Complications: No apparent anesthesia complications

## 2015-04-24 NOTE — Discharge Summary (Signed)
ELECTROPHYSIOLOGY PROCEDURE DISCHARGE SUMMARY    Patient ID: Amy [name],  MRN: [id number], DOB/AGE: October 15, 1945 70 y.o.  Admit date: 04/24/2015 Discharge date:   Primary Care Physician: Aretta Nip, MD Primary Cardiologist: Dr. Radford Pax Electrophysiologist: [name], MD  Primary Discharge Diagnosis:  1. Paroxysmal AFib 2. PAflutter     CHADS2Cvasc is at least 3 on Xarelto  Secondary Discharge Diagnosis:  1. COPD 2. Obesity/OSA 3. CRI (last GFR 49, stage III) 4. CAD (D1 lesion treated medically, otherwise non-obstructive)  Procedures This Admission:  1.  Electrophysiology study and radiofrequency catheter ablation on 04/24/15 by Dr. [name].  This study demonstrated  CONCLUSIONS: 1. Sinus rhythm upon presentation.  2. 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 3. Successful electrical isolation and anatomical encircling of all four pulmonary veins with radiofrequency current. Additional ablation was performed along the posterior wall of the left atrial to create a standard box lesion to isolate the posterior wall. 4. Multiple atypical atrial flutter circuits observed today which were transient and too unstable for mapping/ ablation today.  5. Successful cardioversion of atrial fibrillation 6. Cavo-tricuspid isthmus ablation was performed with complete bidirectional isthmus block achieved.  7. No early apparent complications.  Brief HPI: Amy [name] is a 70 y.o. female with a history of  atrial fibrillation.  They have failed medical therapy with Multaq. Risks, benefits, and alternatives to catheter ablation of atrial fibrillation were reviewed with the patient who wished to proceed.  The patient underwent TEE prior to the procedure which demonstrated normal LV function and no LAA thrombus.    Hospital Course:  The patient was admitted and  underwent EPS/RFCA of atrial fibrillation with details as outlined above.  They were monitored on telemetry overnight which demonstrated SR.  Groin was without complication on the day of discharge.  The patient was examined by Dr. Rayann Heman and considered to be stable for discharge.  Wound care and restrictions were reviewed with the patient.  We will continue her Multaq for re-evaluation at her visit with Roderic Palau, NP in 4 weeks and Dr Rayann Heman in 12 weeks for post ablation follow up.    Physical Exam: Filed Vitals:   04/24/15 1210 04/24/15 2012 04/24/15 2304 04/25/15 0409  BP: 147/81 133/64  100/50  Pulse: 70 66 68 57  Temp:  98.4 F (36.9 C)  97.9 F (36.6 C)  TempSrc:  Oral  Oral  Resp: _0 Height:      Weight:      SpO2: 95% 94% 95% 94%    GEN- The patient is well appearing, alert and oriented x 3 today.   HEENT: normocephalic, atraumatic; sclera clear, conjunctiva pink; hearing intact; oropharynx clear; neck supple  Lungs- Clear to ausculation bilaterally, normal work of breathing.  No wheezes, rales, rhonchi Heart- Regular rate and rhythm, no murmurs, rubs or gallops  GI- soft, non-tender, non-distended, bowel sounds present  Extremities- no clubbing, cyanosis, or edema; DP/PT/radial pulses 2+ bilaterally, groin without hematoma/bruit MS- no significant deformity or atrophy Skin- warm and dry, no rash or lesion Psych- euthymic mood, full affect Neuro- strength and sensation are intact   Labs:   Lab Results  Component Value Date   WBC 7.8 04/18/2015   HGB 13.5 04/18/2015   HCT 42.2 04/18/2015   MCV 91.7 04/18/2015   PLT 333 04/18/2015     Recent Labs Lab 04/25/15 0243  NA  140  K 4.4  CL 114*  CO2 17*  BUN 16  CREATININE 1.20*  CALCIUM 8.1*  GLUCOSE 96     Discharge Medications:    Medication List    TAKE these medications        acetaminophen 500 MG tablet  Commonly known as:  TYLENOL  Take 500 mg by mouth daily as needed (pain).  Counteract by Melaleuca     albuterol 108 (90 Base) MCG/ACT inhaler  Commonly known as:  PROVENTIL HFA;VENTOLIN HFA  Inhale 2 puffs into the lungs every 6 (six) hours as needed for wheezing or shortness of breath.     atorvastatin 80 MG tablet  Commonly known as:  LIPITOR  Take 1 tablet (80 mg total) by mouth daily with lunch.     colchicine 0.6 MG tablet  Take 0.6 mg by mouth every other day.     ezetimibe 10 MG tablet  Commonly known as:  ZETIA  TAKE 1 TABLET (10 MG TOTAL) BY MOUTH DAILY.     furosemide 20 MG tablet  Commonly known as:  LASIX  Take 20 mg by mouth 2 (two) times daily.     KRILL OIL PO  Take 1 capsule by mouth daily.     levothyroxine 75 MCG tablet  Commonly known as:  SYNTHROID, LEVOTHROID  Take 75 mcg by mouth daily before breakfast.     metoprolol tartrate 25 MG tablet  Commonly known as:  LOPRESSOR  Take 0.5 tablets (12.5 mg total) by mouth 2 (two) times daily.     MULTAQ 400 MG tablet  Generic drug:  dronedarone  TAKE 1 TABLET BY MOUTH TWICE A DAY WITH A MEAL     multivitamin capsule  Take 1 capsule by mouth daily.     nitroGLYCERIN 0.4 MG SL tablet  Commonly known as:  NITROSTAT  Place 1 tablet (0.4 mg total) under the tongue every 5 (five) minutes as needed for chest pain.     pantoprazole 40 MG tablet  Commonly known as:  PROTONIX  Take 1 tablet (40 mg total) by mouth daily.     Vitamin D 2000 units Caps  Take 2,000 Units by mouth daily.     XARELTO 20 MG Tabs tablet  Generic drug:  rivaroxaban  TAKE 1 TABLET BY MOUTH EVERY DAY     zolpidem 5 MG tablet  Commonly known as:  AMBIEN  Take 2.5 mg by mouth at bedtime as needed for sleep.        Disposition:  Home  Follow-up Information    Follow up with [facility] On 05/24/2015.   Specialty:  Cardiology   Why:  11:00AM   Contact information:   [address] [id number] [place] [phone]      Follow up with [name], MD.   Specialty:  Cardiology   Why:  an office scheduler will be calling to schedule a follow up visit wth Dr. Amada Jupiter information:   [place] [address] [place] [address] [phone]       Duration of Discharge Encounter: Greater than 30 minutes including physician time.  SignedTommye Standard, PA-C 04/25/2015 7:42 AM   I have seen, examined the patient, and reviewed the above assessment and plan.  On exam, RRR.  Changes to above are made where necessary.    Co Sign: [name], MD 04/25/2015 7:43 AM

## 2015-04-24 NOTE — Discharge Instructions (Signed)
No driving for 4 days. No strenuous activity or lifting over 5 lbs for 1 week. No sexual activity for 1 week. You may return to work on 05/01/14. Keep procedure sites clean & dry. If you notice increased pain, swelling, bleeding or pus, call/return!  You may shower, but no soaking baths/hot tubs/pools for 1 week.     You have an appointment set up with the [facility].  Multiple studies have shown that being followed by a dedicated atrial fibrillation clinic in addition to the standard care you receive from your other physicians improves health. We believe that enrollment in the atrial fibrillation clinic will allow [name] to better care for you.   The phone number to the [facility] is [phone]. The clinic is staffed Monday through Friday from 8:30am to 5pm.  Parking Directions: The clinic is located in the Heart and Vascular Building connected to [facility]. 1)From [address] turn on to [address] and go to the 3rd entrance  (Heart and Vascular entrance) on the right. 2)Look to the right for Heart &Vascular Parking Garage. 3)A code for the entrance is required please call the clinic to receive this.   4)Take the elevators to the 1st floor. Registration is in the room with the glass walls at the end of the hallway.  If you have any trouble parking or locating the clinic, please dont hesitate to call [phone].

## 2015-04-24 NOTE — Anesthesia Procedure Notes (Signed)
Procedure Name: LMA Insertion Date/Time: 04/24/2015 8:06 AM Performed by: Julian Reil Pre-anesthesia Checklist: Patient identified, Emergency Drugs available, Suction available and Patient being monitored Patient Re-evaluated:Patient Re-evaluated prior to inductionOxygen Delivery Method: Circle system utilized Preoxygenation: Pre-oxygenation with 100% oxygen Intubation Type: IV induction Ventilation: Mask ventilation without difficulty LMA: LMA inserted LMA Size: 4.0 Tube type: Oral Number of attempts: 1 Placement Confirmation: positive ETCO2 and breath sounds checked- equal and bilateral Tube secured with: Tape Dental Injury: Teeth and Oropharynx as per pre-operative assessment

## 2015-04-24 NOTE — Progress Notes (Signed)
Pt unable to void on bedpan.  Dr Rayann Heman called and ordered I/O cath.  This was placed resulting in 700cc's clear amber urine out.  Cath removed.

## 2015-04-24 NOTE — H&P (View-Only) (Signed)
Patient ID: Amy [name], female   DOB: [birth year], 70 y.o.   MRN: [id number]     Primary Care Physician: Aretta Nip, MD Referring Physician: Dr. Roseanna Rainbow is a 70 y.o. female with a h/o paf/flutter that was recently in [facility] 2/26 for aflutter with RVR. Has has failed  amiodarone in the past . Troponin was elevated and she had LHC and was found to have moderate D1 disease to be medically managed. She was placed on multaq and d/ced home in sinus rhythm.  She had f/u with Dr. Rayann Heman who discussed ablation, although he anticipated reduced success with ablation. She did not want ablation and is having low afib burden by her report on multaq. She was seen by Dr. Nancy Fetter at [facility] for second opinion and he recommended to continue with multaq and she is wearing a 30 day event monitor and then will f/u at [facility]. She reports one day last week that she had afib for a couple of hours, once around noon and then returned past dinner. She takes extra metoprolol with breakthrough episodes. Still has some diarrhea with multaq but taking with food has helped.  She saw Dr. Rayann Heman again recently and is ready to commit to afib ablation. This is scheduled for 1/17. For the most part, has had very few breakthrough episodes of afib on multaq. IN SR today. Compliant with xarelto.  Today, she denies symptoms of palpitations, chest pain, shortness of breath, orthopnea, PND, lower extremity edema, dizziness, presyncope, syncope, or neurologic sequela. Positive for diarrhea. The patient is tolerating medications without difficulties and is otherwise without complaint today.   Past Medical History  Diagnosis Date  . Hypertension   . Hyperlipidemia   . Hypothyroid   . COPD (chronic obstructive pulmonary disease) ([place])   . Dizziness     had PT for being off balance - in approx. 2012  . Lumbar disc disease   . Macular degeneration   . Chronic diastolic heart failure ([place])   . Apical variant  hypertrophic cardiomyopathy ([place])     Diagnosed by echo and ECG 11/13  . GERD (gastroesophageal reflux disease)   . Persistent atrial fibrillation ([place])        . Sinus bradycardia   . CAD (coronary artery disease)     a. LHC in 10/2011 demonstrated non-obstructive disease and an 80% lesion in a small D1 treated medically.   . OSA (obstructive sleep apnea)   . CKD (chronic kidney disease), stage III   . Pulmonary hypertension ([place])     a. [place] 05/2013 showed mild PAH with normal PCWP and RA pressure, could be related to OSA and low oxygen saturation.   Marland Kitchen Atypical atrial flutter [facility])    Past Surgical History  Procedure Laterality Date  . Cesarean section  1983  . Cardiac catheterization      normal coronary arteries  . Cataract extraction Bilateral   . Right heart catheterization N/A 06/01/2013    Procedure: RIGHT HEART CATH;  Surgeon: Larey Dresser, MD;  Location: [facility] CATH LAB;  Service: Cardiovascular;  Laterality: N/A;  . Cardiac catheterization N/A 10/31/2014    Procedure: Left Heart Cath and Coronary Angiography;  Surgeon: Leonie Man, MD;  Location: [place] CV LAB;  Service: Cardiovascular;  Laterality: N/A;    Current Outpatient Prescriptions  Medication Sig Dispense Refill  . acetaminophen (TYLENOL) 500 MG tablet Take 500 mg by mouth daily as needed (pain). Counteract by Melaleuca    .  albuterol (PROVENTIL HFA;VENTOLIN HFA) 108 (90 BASE) MCG/ACT inhaler Inhale 2 puffs into the lungs every 6 (six) hours as needed for wheezing or shortness of breath.     Marland Kitchen atorvastatin (LIPITOR) 80 MG tablet Take 1 tablet (80 mg total) by mouth daily with lunch. 90 tablet 3  . Cholecalciferol (VITAMIN D) 2000 UNITS CAPS Take 2,000 Units by mouth daily.     . colchicine 0.6 MG tablet Take 0.6 mg by mouth every other day.     . ezetimibe (ZETIA) 10 MG tablet TAKE 1 TABLET (10 MG TOTAL) BY MOUTH DAILY. 90 tablet 3  . furosemide (LASIX) 20 MG tablet Take 20 mg by mouth 2 (two) times daily.      Marland Kitchen KRILL OIL PO Take 1 capsule by mouth daily.    Marland Kitchen levothyroxine (SYNTHROID, LEVOTHROID) 75 MCG tablet Take 75 mcg by mouth daily before breakfast.    . metoprolol tartrate (LOPRESSOR) 25 MG tablet Take 0.5 tablets (12.5 mg total) by mouth 2 (two) times daily. 30 tablet 0  . MULTAQ 400 MG tablet TAKE 1 TABLET BY MOUTH TWICE A DAY WITH A MEAL 60 tablet 6  . Multiple Vitamin (MULTIVITAMIN) capsule Take 1 capsule by mouth daily.    . nitroGLYCERIN (NITROSTAT) 0.4 MG SL tablet Place 1 tablet (0.4 mg total) under the tongue every 5 (five) minutes as needed for chest pain. 25 tablet 3  . XARELTO 20 MG TABS tablet TAKE 1 TABLET BY MOUTH EVERY DAY 30 tablet 4  . zolpidem (AMBIEN) 5 MG tablet Take 2.5 mg by mouth at bedtime as needed for sleep.      No current facility-administered medications for this encounter.    Allergies  Allergen Reactions  . Amiodarone     Dyspnea - felt to be 2/2 amio lung toxicity  . Zyban [Bupropion] Itching    Social History   Social History  . Marital Status: Married    Spouse Name: divorced.  . Number of Children: 1  . Years of Education: N/A   Occupational History  . retired. still works for [employer].     Social History Main Topics  . Smoking status: Former Smoker -- 0.30 packs/day for 44 years    Types: Cigarettes    Quit date: 04/07/2009  . Smokeless tobacco: Never Used  . Alcohol Use: Yes     Comment: 2 to 3 glasses wine per week  . Drug Use: No  . Sexual Activity: No   Other Topics Concern  . Not on file   Social History Narrative   Pt lives alone with 2 dogs.     Family History  Problem Relation Age of Onset  . Heart disease Sister     Good Pastures Syndrome  . Gout Father   . Lung cancer Father   . Breast cancer Paternal Aunt     ROS- All systems are reviewed and negative except as per the HPI above  Physical Exam: Filed Vitals:   04/05/15 1353  BP: 136/86  Pulse: 54  Height: 5' 3" (1.6 m)  Weight: 216 lb 12.8 oz (98.34 kg)     GEN- The patient is well appearing, alert and oriented x 3 today.   Head- normocephalic, atraumatic Eyes-  Sclera clear, conjunctiva pink Ears- hearing intact Oropharynx- clear Neck- supple, no JVP Lymph- no cervical lymphadenopathy Lungs- Clear to ausculation bilaterally, normal work of breathing Heart- Regular rate and rhythm, no murmurs, rubs or gallops, PMI not laterally displaced GI- soft, NT, ND, +  BS Extremities- no clubbing, cyanosis, or edema MS- no significant deformity or atrophy Skin- no rash or lesion Psych- euthymic mood, full affect Neuro- strength and sensation are intact  EKG- Sinus brady at 54 bpm, LVH,  qrs int 74 ms, qtc 430 ms Epic records reviewed  Assessment and Plan:  1. PAF Continue Multaq  Liver panel today Continue metoprolol Continue xarelto Plans in place for ablation 1/17 pr Dr. Rayann Heman   2.Bradycardia Asymptomatic   3. CAD Stable Continue baby asa for occlusion of D1 unamendable to intervention by [facility] 7/16 Aware increases chance of bleeding, watch for signs of bleeding and report   F/U  In afib as needed f/u ablation  Butch Penny C. Gaylin Bulthuis, [facility] [address] [place], [place] [address] [phone]

## 2015-04-24 NOTE — Progress Notes (Signed)
3 veinous sheaths removed from rfv incrementally and direct pressure was maintained for a total of 30 minutes resulting in hemostasis.  Pt instructions given with teach back.  Bedrest starts at 1315 for a total of 6hrs.  Sterile DSG applied to site and distal pulses intact.

## 2015-04-25 ENCOUNTER — Encounter (HOSPITAL_COMMUNITY): Payer: Self-pay | Admitting: General Practice

## 2015-04-25 DIAGNOSIS — J449 Chronic obstructive pulmonary disease, unspecified: Secondary | ICD-10-CM | POA: Diagnosis not present

## 2015-04-25 DIAGNOSIS — I483 Typical atrial flutter: Secondary | ICD-10-CM | POA: Diagnosis not present

## 2015-04-25 DIAGNOSIS — I4891 Unspecified atrial fibrillation: Secondary | ICD-10-CM

## 2015-04-25 DIAGNOSIS — I480 Paroxysmal atrial fibrillation: Secondary | ICD-10-CM | POA: Diagnosis not present

## 2015-04-25 DIAGNOSIS — G4733 Obstructive sleep apnea (adult) (pediatric): Secondary | ICD-10-CM | POA: Diagnosis not present

## 2015-04-25 DIAGNOSIS — I481 Persistent atrial fibrillation: Secondary | ICD-10-CM | POA: Diagnosis not present

## 2015-04-25 DIAGNOSIS — I484 Atypical atrial flutter: Secondary | ICD-10-CM | POA: Diagnosis not present

## 2015-04-25 LAB — BASIC METABOLIC PANEL
Anion gap: 9 (ref 5–15)
BUN: 16 mg/dL (ref 6–20)
CHLORIDE: 114 mmol/L — AB (ref 101–111)
CO2: 17 mmol/L — AB (ref 22–32)
CREATININE: 1.2 mg/dL — AB (ref 0.44–1.00)
Calcium: 8.1 mg/dL — ABNORMAL LOW (ref 8.9–10.3)
GFR calc Af Amer: 52 mL/min — ABNORMAL LOW (ref >60)
GFR calc non Af Amer: 45 mL/min — ABNORMAL LOW (ref >60)
Glucose, Bld: 96 mg/dL (ref 65–99)
Potassium: 4.4 mmol/L (ref 3.5–5.1)
SODIUM: 140 mmol/L (ref 135–145)

## 2015-04-25 MED ORDER — PANTOPRAZOLE SODIUM 40 MG PO TBEC: 40.0000 mg | DELAYED_RELEASE_TABLET | Freq: Every day | ORAL | Status: DC

## 2015-04-25 MED FILL — Acetaminophen w/ Codeine Tab 300-30 MG: ORAL | Qty: 2 | Status: AC

## 2015-04-27 NOTE — Anesthesia Postprocedure Evaluation (Signed)
Anesthesia Post Note  Patient: Amy [name]   Procedure(s) Performed: Procedure(s) (LRB): Atrial Fibrillation Ablation (N/A)  Patient location during evaluation: PACU Anesthesia Type: General Level of consciousness: awake and alert Pain management: pain level controlled Vital Signs Assessment: post-procedure vital signs reviewed and stable Respiratory status: spontaneous breathing, nonlabored ventilation, respiratory function stable and patient connected to nasal cannula oxygen Cardiovascular status: blood pressure returned to baseline and stable Postop Assessment: no signs of nausea or vomiting Anesthetic complications: no    Last Vitals:  Filed Vitals:   04/24/15 2304 04/25/15 0409  BP:  100/50  Pulse: 68 57  Temp:  36.6 C  Resp: 18 18    Last Pain: There were no vitals filed for this visit.               Kahli Mayon,JAMES TERRILL

## 2015-05-24 ENCOUNTER — Ambulatory Visit (HOSPITAL_COMMUNITY)
Admission: RE | Admit: 2015-05-24 | Discharge: 2015-05-24 | Disposition: A | Discharge status: Home / Self Care | Payer: Medicare Other | Source: Ambulatory Visit | Attending: Nurse Practitioner | Admitting: Nurse Practitioner

## 2015-05-24 ENCOUNTER — Encounter (HOSPITAL_COMMUNITY): Payer: Self-pay | Admitting: Nurse Practitioner

## 2015-05-24 VITALS — BP 106/64 | HR 54 | Ht 63.0 in | Wt 215.6 lb

## 2015-05-24 DIAGNOSIS — I481 Persistent atrial fibrillation: Secondary | ICD-10-CM | POA: Insufficient documentation

## 2015-05-24 DIAGNOSIS — Z7902 Long term (current) use of antithrombotics/antiplatelets: Secondary | ICD-10-CM | POA: Insufficient documentation

## 2015-05-24 DIAGNOSIS — K219 Gastro-esophageal reflux disease without esophagitis: Secondary | ICD-10-CM | POA: Insufficient documentation

## 2015-05-24 DIAGNOSIS — Z79899 Other long term (current) drug therapy: Secondary | ICD-10-CM | POA: Insufficient documentation

## 2015-05-24 DIAGNOSIS — J449 Chronic obstructive pulmonary disease, unspecified: Secondary | ICD-10-CM | POA: Insufficient documentation

## 2015-05-24 DIAGNOSIS — Z8249 Family history of ischemic heart disease and other diseases of the circulatory system: Secondary | ICD-10-CM | POA: Insufficient documentation

## 2015-05-24 DIAGNOSIS — H3530 Unspecified macular degeneration: Secondary | ICD-10-CM | POA: Diagnosis not present

## 2015-05-24 DIAGNOSIS — I272 Other secondary pulmonary hypertension: Secondary | ICD-10-CM | POA: Insufficient documentation

## 2015-05-24 DIAGNOSIS — I480 Paroxysmal atrial fibrillation: Secondary | ICD-10-CM

## 2015-05-24 DIAGNOSIS — E039 Hypothyroidism, unspecified: Secondary | ICD-10-CM | POA: Diagnosis not present

## 2015-05-24 DIAGNOSIS — E785 Hyperlipidemia, unspecified: Secondary | ICD-10-CM | POA: Diagnosis not present

## 2015-05-24 DIAGNOSIS — I2510 Atherosclerotic heart disease of native coronary artery without angina pectoris: Secondary | ICD-10-CM | POA: Insufficient documentation

## 2015-05-24 DIAGNOSIS — I129 Hypertensive chronic kidney disease with stage 1 through stage 4 chronic kidney disease, or unspecified chronic kidney disease: Secondary | ICD-10-CM | POA: Diagnosis not present

## 2015-05-24 DIAGNOSIS — Z888 Allergy status to other drugs, medicaments and biological substances status: Secondary | ICD-10-CM | POA: Insufficient documentation

## 2015-05-24 DIAGNOSIS — Z87891 Personal history of nicotine dependence: Secondary | ICD-10-CM | POA: Diagnosis not present

## 2015-05-24 DIAGNOSIS — N183 Chronic kidney disease, stage 3 (moderate): Secondary | ICD-10-CM | POA: Diagnosis not present

## 2015-05-24 DIAGNOSIS — G4733 Obstructive sleep apnea (adult) (pediatric): Secondary | ICD-10-CM | POA: Insufficient documentation

## 2015-05-24 NOTE — Progress Notes (Signed)
Patient ID: Amy [name], female   DOB: [birth year], 70 y.o.   MRN: [id number]     Primary Care Physician: Aretta Nip, MD Referring Physician: Dr. Roseanna Rainbow is a 70 y.o. female with a h/o persistent symptomatic afib that recently underwent afib ablation. She reports a few hours of afib yesterday but it was gone when she woke up this am. She denies any dysphagia or rt groin pain with the procedure. She did have a mild redness skin reaction to the cardioversion during the procedure. She is being consistent with multaq and xarelto.  Today, she denies symptoms of palpitations, chest pain, shortness of breath, orthopnea, PND, lower extremity edema, dizziness, presyncope, syncope, or neurologic sequela. The patient is tolerating medications without difficulties and is otherwise without complaint today.   Past Medical History  Diagnosis Date  . Hypertension   . Hyperlipidemia   . Hypothyroid   . COPD (chronic obstructive pulmonary disease) ([place])   . Dizziness     had PT for being off balance - in approx. 2012  . Lumbar disc disease   . Macular degeneration   . Chronic diastolic heart failure ([place])   . Apical variant hypertrophic cardiomyopathy ([place])     Diagnosed by echo and ECG 11/13  . GERD (gastroesophageal reflux disease)   . Persistent atrial fibrillation ([place])        . Sinus bradycardia   . CAD (coronary artery disease)     a. LHC in 10/2011 demonstrated non-obstructive disease and an 80% lesion in a small D1 treated medically.   . OSA (obstructive sleep apnea)   . CKD (chronic kidney disease), stage III   . Pulmonary hypertension ([place])     a. [place] 05/2013 showed mild PAH with normal PCWP and RA pressure, could be related to OSA and low oxygen saturation.   Marland Kitchen Atypical atrial flutter ([place])   . Refusal of blood transfusions as patient is Jehovah's Witness    Past Surgical History  Procedure Laterality Date  . Cesarean section  1983  . Cardiac  catheterization      normal coronary arteries  . Cataract extraction Bilateral   . Right heart catheterization N/A 06/01/2013    Procedure: RIGHT HEART CATH;  Surgeon: Larey Dresser, MD;  Location: [facility] CATH LAB;  Service: Cardiovascular;  Laterality: N/A;  . Cardiac catheterization N/A 10/31/2014    Procedure: Left Heart Cath and Coronary Angiography;  Surgeon: Leonie Man, MD;  Location: [place] CV LAB;  Service: Cardiovascular;  Laterality: N/A;  . [place] without cardioversion N/A 04/23/2015    Procedure: TRANSESOPHAGEAL ECHOCARDIOGRAM (TEE);  Surgeon: Josue Hector, MD;  Location: [facility] ENDOSCOPY;  Service: Cardiovascular;  Laterality: N/A;  . Electrophysiologic study N/A 04/24/2015    Procedure: Atrial Fibrillation Ablation;  Surgeon: [name], MD;  Location: [place] CV LAB;  Service: Cardiovascular;  Laterality: N/A;    Current Outpatient Prescriptions  Medication Sig Dispense Refill  . acetaminophen (TYLENOL) 500 MG tablet Take 500 mg by mouth daily as needed (pain). Counteract by Melaleuca    . albuterol (PROVENTIL HFA;VENTOLIN HFA) 108 (90 BASE) MCG/ACT inhaler Inhale 2 puffs into the lungs every 6 (six) hours as needed for wheezing or shortness of breath.     Marland Kitchen atorvastatin (LIPITOR) 80 MG tablet Take 1 tablet (80 mg total) by mouth daily with lunch. 90 tablet 3  . Cholecalciferol (VITAMIN D) 2000 UNITS CAPS Take 2,000 Units by mouth daily.     Marland Kitchen  colchicine 0.6 MG tablet Take 0.6 mg by mouth every other day.     . ezetimibe (ZETIA) 10 MG tablet TAKE 1 TABLET (10 MG TOTAL) BY MOUTH DAILY. 90 tablet 3  . furosemide (LASIX) 20 MG tablet Take 20 mg by mouth 2 (two) times daily.    Marland Kitchen KRILL OIL PO Take 1 capsule by mouth daily.    Marland Kitchen levothyroxine (SYNTHROID, LEVOTHROID) 75 MCG tablet Take 75 mcg by mouth daily before breakfast.    . metoprolol tartrate (LOPRESSOR) 25 MG tablet Take 0.5 tablets (12.5 mg total) by mouth 2 (two) times daily. 30 tablet 0  . MULTAQ 400 MG tablet TAKE 1  TABLET BY MOUTH TWICE A DAY WITH A MEAL 60 tablet 6  . Multiple Vitamin (MULTIVITAMIN) capsule Take 1 capsule by mouth daily.    . nitroGLYCERIN (NITROSTAT) 0.4 MG SL tablet Place 1 tablet (0.4 mg total) under the tongue every 5 (five) minutes as needed for chest pain. 25 tablet 3  . pantoprazole (PROTONIX) 40 MG tablet Take 1 tablet (40 mg total) by mouth daily. 45 tablet 0  . XARELTO 20 MG TABS tablet TAKE 1 TABLET BY MOUTH EVERY DAY 30 tablet 4  . zolpidem (AMBIEN) 5 MG tablet Take 2.5 mg by mouth at bedtime as needed for sleep.      No current facility-administered medications for this encounter.    Allergies  Allergen Reactions  . Amiodarone     Dyspnea - felt to be 2/2 amio lung toxicity Has tolerated Omnipaque   . Zyban [Bupropion] Itching    Social History   Social History  . Marital Status: Widowed    Spouse Name: divorced.  . Number of Children: 1  . Years of Education: N/A   Occupational History  . retired. still works for [employer].     Social History Main Topics  . Smoking status: Former Smoker -- 0.30 packs/day for 44 years    Types: Cigarettes    Quit date: 04/07/2009  . Smokeless tobacco: Never Used  . Alcohol Use: Yes     Comment: 2 to 3 glasses wine per week  . Drug Use: No  . Sexual Activity: No   Other Topics Concern  . Not on file   Social History Narrative   Pt lives alone with 2 dogs.     Family History  Problem Relation Age of Onset  . Heart disease Sister     Good Pastures Syndrome  . Gout Father   . Lung cancer Father   . Breast cancer Paternal Aunt     ROS- All systems are reviewed and negative except as per the HPI above  Physical Exam: Filed Vitals:   05/24/15 1110  BP: 106/64  Pulse: 54  Height: _0  (1.6 m)  Weight: 215 lb 9.6 oz (97.796 kg)    GEN- The patient is well appearing, alert and oriented x 3 today.   Head- normocephalic, atraumatic Eyes-  Sclera clear, conjunctiva pink Ears- hearing intact Oropharynx-  clear Neck- supple, no JVP Lymph- no cervical lymphadenopathy Lungs- Clear to ausculation bilaterally, normal work of breathing Heart- Regular rate and rhythm, no murmurs, rubs or gallops, PMI not laterally displaced GI- soft, NT, ND, + BS Extremities- no clubbing, cyanosis, or edema MS- no significant deformity or atrophy Skin- no rash or lesion Psych- euthymic mood, full affect Neuro- strength and sensation are intact  EKG-Sinus brady at 54 bpm, pr int 122 ms, qrs int 106 ms, qtc 458 ms with  chronic t wave abnormalities  Epic records reviewed  Assessment and Plan: 1. Persistent afib S/p afib ablation Continue multaq 400 mg bid, metoprolol Continue xarelto  F/u with afib clinic as needed  F/u with Dr. Rayann Heman as on recall in April   Donna C. Carroll, [facility] [address] [place], [place] [address] [phone]

## 2015-05-25 ENCOUNTER — Inpatient Hospital Stay (HOSPITAL_COMMUNITY): Admission: RE | Admit: 2015-05-25 | Payer: Medicare Other | Source: Ambulatory Visit | Admitting: Nurse Practitioner

## 2015-06-11 ENCOUNTER — Other Ambulatory Visit: Payer: Self-pay | Admitting: Cardiology

## 2015-07-14 ENCOUNTER — Other Ambulatory Visit: Payer: Self-pay | Admitting: Internal Medicine

## 2015-07-16 NOTE — Telephone Encounter (Signed)
Ok to fill under Dr Rayann Heman

## 2015-07-17 ENCOUNTER — Other Ambulatory Visit: Payer: Self-pay | Admitting: Cardiology

## 2015-07-18 ENCOUNTER — Emergency Department (HOSPITAL_COMMUNITY): Payer: Medicare Other

## 2015-07-18 ENCOUNTER — Encounter (HOSPITAL_COMMUNITY): Payer: Self-pay | Admitting: Neurology

## 2015-07-18 ENCOUNTER — Emergency Department (HOSPITAL_COMMUNITY)
Admission: EM | Admit: 2015-07-18 | Discharge: 2015-07-18 | Disposition: A | Discharge status: Home / Self Care | Payer: Medicare Other | Attending: Emergency Medicine | Admitting: Emergency Medicine

## 2015-07-18 DIAGNOSIS — Z7901 Long term (current) use of anticoagulants: Secondary | ICD-10-CM | POA: Insufficient documentation

## 2015-07-18 DIAGNOSIS — Z8739 Personal history of other diseases of the musculoskeletal system and connective tissue: Secondary | ICD-10-CM | POA: Insufficient documentation

## 2015-07-18 DIAGNOSIS — J449 Chronic obstructive pulmonary disease, unspecified: Secondary | ICD-10-CM | POA: Diagnosis not present

## 2015-07-18 DIAGNOSIS — I2510 Atherosclerotic heart disease of native coronary artery without angina pectoris: Secondary | ICD-10-CM | POA: Insufficient documentation

## 2015-07-18 DIAGNOSIS — I5032 Chronic diastolic (congestive) heart failure: Secondary | ICD-10-CM | POA: Insufficient documentation

## 2015-07-18 DIAGNOSIS — N183 Chronic kidney disease, stage 3 (moderate): Secondary | ICD-10-CM | POA: Diagnosis not present

## 2015-07-18 DIAGNOSIS — Z87891 Personal history of nicotine dependence: Secondary | ICD-10-CM | POA: Diagnosis not present

## 2015-07-18 DIAGNOSIS — I481 Persistent atrial fibrillation: Secondary | ICD-10-CM | POA: Insufficient documentation

## 2015-07-18 DIAGNOSIS — R079 Chest pain, unspecified: Secondary | ICD-10-CM | POA: Diagnosis present

## 2015-07-18 DIAGNOSIS — K219 Gastro-esophageal reflux disease without esophagitis: Secondary | ICD-10-CM | POA: Insufficient documentation

## 2015-07-18 DIAGNOSIS — Z9889 Other specified postprocedural states: Secondary | ICD-10-CM | POA: Insufficient documentation

## 2015-07-18 DIAGNOSIS — Z79899 Other long term (current) drug therapy: Secondary | ICD-10-CM | POA: Diagnosis not present

## 2015-07-18 DIAGNOSIS — I480 Paroxysmal atrial fibrillation: Secondary | ICD-10-CM | POA: Diagnosis not present

## 2015-07-18 DIAGNOSIS — Z8669 Personal history of other diseases of the nervous system and sense organs: Secondary | ICD-10-CM | POA: Insufficient documentation

## 2015-07-18 DIAGNOSIS — E039 Hypothyroidism, unspecified: Secondary | ICD-10-CM | POA: Insufficient documentation

## 2015-07-18 DIAGNOSIS — R110 Nausea: Secondary | ICD-10-CM | POA: Diagnosis not present

## 2015-07-18 DIAGNOSIS — E785 Hyperlipidemia, unspecified: Secondary | ICD-10-CM | POA: Insufficient documentation

## 2015-07-18 DIAGNOSIS — R000 Tachycardia, unspecified: Secondary | ICD-10-CM | POA: Diagnosis not present

## 2015-07-18 DIAGNOSIS — I4892 Unspecified atrial flutter: Secondary | ICD-10-CM | POA: Insufficient documentation

## 2015-07-18 DIAGNOSIS — R0789 Other chest pain: Secondary | ICD-10-CM | POA: Diagnosis not present

## 2015-07-18 DIAGNOSIS — I129 Hypertensive chronic kidney disease with stage 1 through stage 4 chronic kidney disease, or unspecified chronic kidney disease: Secondary | ICD-10-CM | POA: Diagnosis not present

## 2015-07-18 LAB — BASIC METABOLIC PANEL WITH GFR
Anion gap: 12 (ref 5–15)
BUN: 21 mg/dL — ABNORMAL HIGH (ref 6–20)
CO2: 21 mmol/L — ABNORMAL LOW (ref 22–32)
Calcium: 9.1 mg/dL (ref 8.9–10.3)
Chloride: 109 mmol/L (ref 101–111)
Creatinine, Ser: 1.36 mg/dL — ABNORMAL HIGH (ref 0.44–1.00)
GFR calc Af Amer: 45 mL/min — ABNORMAL LOW
GFR calc non Af Amer: 38 mL/min — ABNORMAL LOW
Glucose, Bld: 93 mg/dL (ref 65–99)
Potassium: 4.1 mmol/L (ref 3.5–5.1)
Sodium: 142 mmol/L (ref 135–145)

## 2015-07-18 LAB — CBC
HCT: 40.7 % (ref 36.0–46.0)
Hemoglobin: 13.2 g/dL (ref 12.0–15.0)
MCH: 29.2 pg (ref 26.0–34.0)
MCHC: 32.4 g/dL (ref 30.0–36.0)
MCV: 90 fL (ref 78.0–100.0)
Platelets: 218 K/uL (ref 150–400)
RBC: 4.52 MIL/uL (ref 3.87–5.11)
RDW: 16.9 % — ABNORMAL HIGH (ref 11.5–15.5)
WBC: 7.8 K/uL (ref 4.0–10.5)

## 2015-07-18 LAB — I-STAT TROPONIN, ED: Troponin i, poc: 0 ng/mL (ref 0.00–0.08)

## 2015-07-18 NOTE — ED Notes (Signed)
Per ems- Pt was shopping today when she developed some chest discomfort, looked at her watch and saw her HR was 130. She took metoprolol, EMS arrived HR ST 130. While in the truck, noticed Afib and bigeminy HR 125, then converted to SR. Given 1 nitro, 324 aspirin. Denies cp or sob.Marland Kitchen Pt is a x 4. Also reports she waited about 1.5 hr after taking metoprolol before calling EMS.

## 2015-07-18 NOTE — ED Provider Notes (Signed)
CSN: [id number]     Arrival date & time 07/18/15  1416 History   First MD Initiated Contact with Patient 07/18/15 1422     Chief Complaint  Patient presents with  . Chest Pain     (Consider location/radiation/quality/duration/timing/severity/associated sxs/prior Treatment) HPI Comments: Took 12.32m metoprolol this AM Noon started having tachycardia, took 264mmetoprolol Chest tightness, retrosternal, moved to jaw line, palpitations, no sob/sweating. Mild nausea Felt like prior a fib episodes Waited 1.5 hr then called EMS Took nitro and ASA with EMS then HR decreased and chest pain dissipated since the ablation Jan 17 had 2 episodes resolved at home Taking xarelto once per day Took one cup coffee this AM, no other new medicines, no diarrhea/vomiting/dysuria/cough   Now no chest pressure, no fast heart rate or palpitations   Patient is a 70.o. female presenting with chest pain.  Chest Pain Associated symptoms: nausea   Associated symptoms: no abdominal pain, no back pain, no cough, no fever, no headache, no shortness of breath and not vomiting     Past Medical History  Diagnosis Date  . Hypertension   . Hyperlipidemia   . Hypothyroid   . COPD (chronic obstructive pulmonary disease) (HCC[place]  . Dizziness     had PT for being off balance - in approx. 2012  . Lumbar disc disease   . Macular degeneration   . Chronic diastolic heart failure (HCEast [place]  . Apical variant hypertrophic cardiomyopathy (HCKiskimere    Diagnosed by echo and ECG 11/13  . GERD (gastroesophageal reflux disease)   . Persistent atrial fibrillation (HCMalta       . Sinus bradycardia   . CAD (coronary artery disease)     a. LHC in 10/2011 demonstrated non-obstructive disease and an 80% lesion in a small D1 treated medically.   . OSA (obstructive sleep apnea)   . CKD (chronic kidney disease), stage III   . Pulmonary hypertension (HCBlooming [place]    [place]. [place] showed mild PAH with normal PCWP and RA pressure, could be  related to OSA and low oxygen saturation.   . Marland Kitchentypical atrial flutter (HCDraper  . Refusal of blood transfusions as patient is Jehovah's Witness    Past Surgical History  Procedure Laterality Date  . Cesarean section  1983  . Cardiac catheterization      normal coronary arteries  . Cataract extraction Bilateral   . Right heart catheterization N/A 06/01/2013    Procedure: RIGHT HEART CATH;  Surgeon: DaLarey DresserMD;  [facility];  Service: Cardiovascular;  Laterality: N/A;  . Cardiac catheterization N/A 10/31/2014    Procedure: Left Heart Cath and Coronary Angiography;  Surgeon: DaLeonie ManMD;  [facility];  Service: Cardiovascular;  Laterality: N/A;  . Tee without cardioversion N/A 04/23/2015    Procedure: TRANSESOPHAGEAL ECHOCARDIOGRAM (TEE);  Surgeon: PeJosue HectorMD;  [facility];  Service: Cardiovascular;  Laterality: N/A;  . Electrophysiologic study N/A 04/24/2015    Procedure: Atrial Fibrillation Ablation;  Surgeon: JaThompson GrayerMD;  [facility];  Service: Cardiovascular;  Laterality: N/A;   Family History  Problem Relation Age of Onset  . Heart disease Sister     [place] Syndrome  . Gout Father   . Lung cancer Father   . Breast cancer Paternal Aunt    Social History  Substance Use Topics  . Smoking status: Former Smoker -- 0.30 packs/day for 44 years  Types: Cigarettes    Quit date: 04/07/2009  . Smokeless tobacco: Never Used  . Alcohol Use: Yes     Comment: 2 to 3 glasses wine per week   OB History    No data available     Review of Systems  Constitutional: Negative for fever.  HENT: Negative for sore throat.   Eyes: Negative for visual disturbance.  Respiratory: Negative for cough and shortness of breath.   Cardiovascular: Positive for chest pain.  Gastrointestinal: Positive for nausea. Negative for vomiting, abdominal pain, diarrhea and constipation.  Genitourinary: Negative for difficulty  urinating.  Musculoskeletal: Negative for back pain and neck pain.  Skin: Negative for rash.  Neurological: Negative for syncope and headaches.      Allergies  Amiodarone and Zyban  Home Medications   Prior to Admission medications   Medication Sig Start Date End Date Taking? Authorizing Provider  albuterol (PROVENTIL HFA;VENTOLIN HFA) 108 (90 BASE) MCG/ACT inhaler Inhale 2 puffs into the lungs every 6 (six) hours as needed for wheezing or shortness of breath.    Yes Historical Provider, MD  atorvastatin (LIPITOR) 80 MG tablet Take 1 tablet (80 mg total) by mouth daily with lunch. 02/08/15  Yes Sherran Needs, NP  Cholecalciferol (VITAMIN D) 2000 UNITS CAPS Take 2,000 Units by mouth daily.    Yes Historical Provider, MD  colchicine 0.6 MG tablet Take 0.6 mg by mouth every other day.    Yes Historical Provider, MD  ezetimibe (ZETIA) 10 MG tablet TAKE 1 TABLET (10 MG TOTAL) BY MOUTH DAILY. 02/09/15  Yes Sueanne Margarita, MD  furosemide (LASIX) 20 MG tablet Take 20 mg by mouth 2 (two) times daily.   Yes Historical Provider, MD  KRILL OIL PO Take 1 capsule by mouth daily.   Yes Historical Provider, MD  levothyroxine (SYNTHROID, LEVOTHROID) 75 MCG tablet Take 75 mcg by mouth daily before breakfast.   Yes Historical Provider, MD  metoprolol tartrate (LOPRESSOR) 25 MG tablet Take 0.5 tablets (12.5 mg total) by mouth 2 (two) times daily. 11/01/14  Yes Charlynne Cousins, MD  MULTAQ 400 MG tablet TAKE 1 TABLET BY MOUTH TWICE A DAY WITH A MEAL 01/12/15  Yes Sherran Needs, NP  Multiple Vitamin (MULTIVITAMIN) capsule Take 1 capsule by mouth daily.   Yes Historical Provider, MD  nitroGLYCERIN (NITROSTAT) 0.4 MG SL tablet Place 1 tablet (0.4 mg total) under the tongue every 5 (five) minutes as needed for chest pain. 07/19/14  Yes Sueanne Margarita, MD  pantoprazole (PROTONIX) 40 MG tablet TAKE 1 TABLET (40 MG TOTAL) BY MOUTH DAILY. 07/16/15  Yes [name], MD  XARELTO 20 MG TABS tablet TAKE 1 TABLET BY  MOUTH EVERY DAY Patient taking differently: TAKE 1 TABLET BY MOUTH EVERY evenin 06/11/15  Yes Sueanne Margarita, MD  zolpidem (AMBIEN) 5 MG tablet Take 2.5 mg by mouth at bedtime as needed for sleep.    Yes Historical Provider, MD  acetaminophen (TYLENOL) 500 MG tablet Take 500 mg by mouth daily as needed (pain). Counteract by Melaleuca    Historical Provider, MD  furosemide (LASIX) 20 MG tablet TAKE 1 TABLET (20 MG TOTAL) BY MOUTH 2 (TWO) TIMES DAILY. 07/18/15   Sueanne Margarita, MD   BP 114/64 mmHg  Pulse 67  Temp(Src) 98 F (36.7 C) (Oral)  Resp 15  SpO2 100% Physical Exam  Constitutional: She is oriented to person, place, and time. She appears well-developed and well-nourished. No distress.  HENT:  Head: Normocephalic  and atraumatic.  Eyes: Conjunctivae and EOM are normal.  Neck: Normal range of motion.  Cardiovascular: Normal rate, regular rhythm, normal heart sounds and intact distal pulses.  Exam reveals no gallop and no friction rub.   No murmur heard. Pulmonary/Chest: Effort normal and breath sounds normal. No respiratory distress. She has no wheezes. She has no rales.  Abdominal: Soft. She exhibits no distension. There is no tenderness. There is no guarding.  Musculoskeletal: She exhibits no edema or tenderness.  Neurological: She is alert and oriented to person, place, and time.  Skin: Skin is warm and dry. No rash noted. She is not diaphoretic. No erythema.  Nursing note and vitals reviewed.   ED Course  Procedures (including critical care time) Labs Review Labs Reviewed  BASIC METABOLIC PANEL - Abnormal; Notable for the following:    CO2 21 (*)    BUN 21 (*)    Creatinine, Ser 1.36 (*)    GFR calc non Af Amer 38 (*)    GFR calc Af Amer 45 (*)    All other components within normal limits  CBC - Abnormal; Notable for the following:    RDW 16.9 (*)    All other components within normal limits  I-STAT TROPOININ, ED    Imaging Review Dg Chest 2 View  07/18/2015   CLINICAL DATA:  Developed chest discomfort while shopping today, tachycardia, history hypertension, atrial fibrillation, COPD, GERD, coronary artery disease, atrial flutter, pulmonary hypertension, former smoker EXAM: CHEST  2 VIEW COMPARISON:  10/28/2014 FINDINGS: Enlargement of cardiac silhouette. Atherosclerotic calcification aorta. Mediastinal contours and pulmonary vascularity normal. Bronchitic changes with scarring at LEFT base. No acute infiltrate, pleural effusion or pneumothorax. Bones mildly demineralized. IMPRESSION: Enlargement of cardiac silhouette. Bronchitic changes with LEFT basilar scarring but no acute infiltrate. Electronically Signed   By: Lavonia Dana M.D.   On: 07/18/2015 15:04   I have personally reviewed and evaluated these images and lab results as part of my medical decision-making.   EKG Interpretation   Date/Time:  Wednesday July 18 2015 14:22:59 EDT Ventricular Rate:  64 PR Interval:  175 QRS Duration: 74 QT Interval:  447 QTC Calculation: 461 R Axis:   74 Text Interpretation:  Sinus rhythm Consider left atrial enlargement LVH  with secondary repolarization abnormality No significant change since last  tracing Confirmed by [facility] MD, Addisyn Leclaire ([id number]) on 07/18/2015 2:25:44 PM      MDM   Final diagnoses:  Atrial flutter with rapid ventricular response [facility])   70 year old female with a history of hypertension, hyperlipidemia, COPD, chronic diastolic heart failure, apical variant hypertrophic cardiomyopathy, atrial fibrillation/flutter on xarelto, status post ablation January 19, CKD, presents with concern for episode of palpitations with associated chest pressure.    Patient had episode of palpitations with heart rate in the 130s, and EKG performed by EMS confirms presence of atrial flutter with a rapid ventricular response in the 130s. Patient had taken 25 mg of metoprolol hour and half prior to EMS arrival, and had received nitroglycerin and aspirin with EMS,  and converted back to a normal sinus rhythm with a rate with a rate in the 50s and 60s. Her EKG and arrival to the emergency department is unchanged from prior. Patient remains chest pain-free after resolution of her atrial fibrillation. She had a catheterization in June 2016, when it was concluded that patient's troponin elevation at that time was likely demand related.  Feel patient's symptoms of chest pressure likely secondary to rate, and reassured that when  patient's rate is normal, she is not having chest pain, and have low suspicion for ACS.  Patient remains hemodynamically stable and asymptomatic in ED, and reports some chronic bradycardia on her beta blockers. Recommended not taking nighttime dose of beta blocker at this time given current HR. Unclear trigger for this episode, no recent infections, however pt had a cup of coffee. Discussed discontinuing caffeine and following up with Cardiology closely for further evaluation and medication changes.     Gareth Morgan, MD 07/18/15 1948

## 2015-07-18 NOTE — Discharge Instructions (Signed)
Atrial Flutter Atrial flutter is a heart rhythm that can cause the heart to beat very fast (tachycardia). It originates in the upper chambers of the heart (atria). In atrial flutter, the top chambers of the heart (atria) often beat much faster than the bottom chambers of the heart (ventricles). Atrial flutter has a regular "saw toothed" appearance in an EKG readout. An EKG is a test that records the electrical activity of the heart. Atrial flutter can cause the heart to beat up to 150 beats per minute (BPM). Atrial flutter can either be short lived (paroxysmal) or permanent.  CAUSES  Causes of atrial flutter can be many. Some of these include:  Heart related issues:  Heart attack (myocardial infarction).  Heart failure.  Heart valve problems.  Poorly controlled high blood pressure (hypertension).  After open heart surgery.  Lung related issues:  A blood clot in the lungs (pulmonary embolism).  Chronic obstructive pulmonary disease (COPD). Medications used to treat COPD can attribute to atrial flutter.  Other related causes:  Hyperthyroidism.  Caffeine.  Some decongestant cold medications.  Low electrolyte levels such as potassium or magnesium.  Cocaine. SYMPTOMS  An awareness of your heart beating rapidly (palpitations).  Shortness of breath.  Chest pain.  Low blood pressure (hypotension).  Dizziness or fainting. DIAGNOSIS  Different tests can be performed to diagnose atrial flutter.   An EKG.  Holter monitor. This is a 24-hour recording of your heart rhythm. You will also be given a diary. Write down all symptoms that you have and what you were doing at the time you experienced symptoms.  Cardiac event monitor. This small device can be worn for up to 30 days. When you have heart symptoms, you will push a button on the device. This will then record your heart rhythm.  Echocardiogram. This is an imaging test to look at your heart. Your caregiver will look at your  heart valves and the ventricles.  Stress test. This test can help determine if the atrial flutter is related to exercise or if coronary artery disease is present.  Laboratory studies will look at certain blood levels like:  Complete blood count (CBC).  Potassium.  Magnesium.  Thyroid function. TREATMENT  Treatment of atrial flutter varies. A combination of therapies may be used or sometimes atrial flutter may need only 1 type of treatment.  Lab work: If your blood work, such as your electrolytes (potassium, magnesium) or your thyroid function tests, are abnormal, your caregiver will treat them accordingly.  Medication:  There are several different types of medications that can convert your heart to a normal rhythm and prevent atrial flutter from reoccurring.  Nonsurgical procedures: Nonsurgical techniques may be used to control atrial flutter. Some examples include:  Cardioversion. This technique uses either drugs or an electrical shock to restore a normal heart rhythm:  Cardioversion drugs may be given through an intravenous (IV) line to help "reset" the heart rhythm.  In electrical cardioversion, your caregiver shocks your heart with electrical energy. This helps to reset the heartbeat to a normal rhythm.  Ablation. If atrial flutter is a persistent problem, an ablation may be needed. This procedure is done under mild sedation. High frequency radio-wave energy is used to destroy the area of heart tissue responsible for atrial flutter. SEEK IMMEDIATE MEDICAL CARE IF:  You have:  Dizziness.  Near fainting or fainting.  Shortness of breath.  Chest pain or pressure.  Sudden nausea or vomiting.  Profuse sweating. If you have the above symptoms,  call your local emergency service immediately! Do not drive yourself to the hospital. MAKE SURE YOU:   Understand these instructions.  Will watch your condition.  Will get help right away if you are not doing well or get worse.    This information is not intended to replace advice given to you by your health care provider. Make sure you discuss any questions you have with your health care provider.   Document Released: 08/10/2008 Document Revised: 04/14/2014 Document Reviewed: 10/06/2014 Elsevier Interactive Patient Education [employer].

## 2015-07-31 DIAGNOSIS — I10 Essential (primary) hypertension: Secondary | ICD-10-CM | POA: Diagnosis not present

## 2015-07-31 DIAGNOSIS — E039 Hypothyroidism, unspecified: Secondary | ICD-10-CM | POA: Diagnosis not present

## 2015-07-31 DIAGNOSIS — I480 Paroxysmal atrial fibrillation: Secondary | ICD-10-CM | POA: Diagnosis not present

## 2015-07-31 DIAGNOSIS — E785 Hyperlipidemia, unspecified: Secondary | ICD-10-CM | POA: Diagnosis not present

## 2015-08-03 ENCOUNTER — Ambulatory Visit (INDEPENDENT_AMBULATORY_CARE_PROVIDER_SITE_OTHER): Payer: Medicare Other | Admitting: Internal Medicine

## 2015-08-03 ENCOUNTER — Encounter: Payer: Self-pay | Admitting: Internal Medicine

## 2015-08-03 VITALS — BP 122/72 | HR 61 | Ht 63.5 in | Wt 214.4 lb

## 2015-08-03 DIAGNOSIS — I422 Other hypertrophic cardiomyopathy: Secondary | ICD-10-CM

## 2015-08-03 DIAGNOSIS — I480 Paroxysmal atrial fibrillation: Secondary | ICD-10-CM

## 2015-08-03 NOTE — Patient Instructions (Signed)

## 2015-08-03 NOTE — Progress Notes (Signed)
Electrophysiology Office Note   Date:  08/03/2015   ID:  Amy [name], DOB 03-16-1946, MRN [id number]  PCP:  Aretta Nip, MD  Cardiologist:  Dr Radford Pax Primary Electrophysiologist: Dr Caryl Comes   Chief Complaint  Patient presents with  . Atrial Fibrillation     History of Present Illness: Amy [name] is a 70 y.o. female who presents today for electrophysiology evaluation.   She has apical variant hypertrophic CM.  She also has severe atrial enlargement by MRI.  She recently underwent afib ablation by me.  She was noted to have multiple atypical flutter circuits not amenable to ablation at that time. She has had some ERAF but has actually done rather well since her ablation.  She reports compliance with CPAP.  She does not exercise and has made no progress with weight reduction/ lifestyle modificaiton. Today, she denies symptoms of chest pain, orthopnea, PND, lower extremity edema, claudication, dizziness, presyncope, syncope, bleeding, or neurologic sequela. The patient is tolerating medications without difficulties and is otherwise without complaint today.    Past Medical History  Diagnosis Date  . Hypertension   . Hyperlipidemia   . Hypothyroid   . COPD (chronic obstructive pulmonary disease) ([place])   . Dizziness     had PT for being off balance - in approx. 2012  . Lumbar disc disease   . Macular degeneration   . Chronic diastolic heart failure ([place])   . Apical variant hypertrophic cardiomyopathy ([place])     Diagnosed by echo and ECG 11/13  . GERD (gastroesophageal reflux disease)   . Persistent atrial fibrillation ([place])        . Sinus bradycardia   . CAD (coronary artery disease)     a. LHC in 10/2011 demonstrated non-obstructive disease and an 80% lesion in a small D1 treated medically.   . OSA (obstructive sleep apnea)   . CKD (chronic kidney disease), stage III   . Pulmonary hypertension ([place])     a. [place] 05/2013 showed mild PAH with normal PCWP and  RA pressure, could be related to OSA and low oxygen saturation.   Marland Kitchen Atypical atrial flutter ([place])   . Refusal of blood transfusions as patient is Jehovah's Witness    Past Surgical History  Procedure Laterality Date  . Cesarean section  1983  . Cardiac catheterization      normal coronary arteries  . Cataract extraction Bilateral   . Right heart catheterization N/A 06/01/2013    Procedure: RIGHT HEART CATH;  Surgeon: Larey Dresser, MD;  [facility];  Service: Cardiovascular;  Laterality: N/A;  . Cardiac catheterization N/A 10/31/2014    Procedure: Left Heart Cath and Coronary Angiography;  Surgeon: Leonie Man, MD;  Location: [place] CV LAB;  Service: Cardiovascular;  Laterality: N/A;  . Tee without cardioversion N/A 04/23/2015    Procedure: TRANSESOPHAGEAL ECHOCARDIOGRAM (TEE);  Surgeon: Josue Hector, MD;  [facility];  Service: Cardiovascular;  Laterality: N/A;  . Electrophysiologic study N/A 04/24/2015    Procedure: Atrial Fibrillation Ablation;  Surgeon: [name], MD;  Location: [place] CV LAB;  Service: Cardiovascular;  Laterality: N/A;     Current Outpatient Prescriptions  Medication Sig Dispense Refill  . acetaminophen (TYLENOL) 500 MG tablet Take 500 mg by mouth daily as needed (pain). Counteract by Melaleuca    . albuterol (PROVENTIL HFA;VENTOLIN HFA) 108 (90 BASE) MCG/ACT inhaler Inhale 2 puffs into the lungs every 6 (six) hours as needed for wheezing or shortness  of breath.     Marland Kitchen atorvastatin (LIPITOR) 80 MG tablet Take 1 tablet (80 mg total) by mouth daily with lunch. 90 tablet 3  . Cholecalciferol (VITAMIN D) 2000 UNITS CAPS Take 2,000 Units by mouth daily.     . colchicine 0.6 MG tablet Take 0.6 mg by mouth every other day.     . ezetimibe (ZETIA) 10 MG tablet TAKE 1 TABLET (10 MG TOTAL) BY MOUTH DAILY. 90 tablet 3  . furosemide (LASIX) 20 MG tablet Take 20 mg by mouth 2 (two) times daily.    . furosemide (LASIX) 20 MG tablet TAKE 1  TABLET (20 MG TOTAL) BY MOUTH 2 (TWO) TIMES DAILY. 60 tablet 0  . KRILL OIL PO Take 1 capsule by mouth daily.    Marland Kitchen levothyroxine (SYNTHROID, LEVOTHROID) 75 MCG tablet Take 75 mcg by mouth daily before breakfast.    . metoprolol tartrate (LOPRESSOR) 25 MG tablet Take 0.5 tablets (12.5 mg total) by mouth 2 (two) times daily. 30 tablet 0  . MULTAQ 400 MG tablet TAKE 1 TABLET BY MOUTH TWICE A DAY WITH A MEAL 60 tablet 6  . Multiple Vitamin (MULTIVITAMIN) capsule Take 1 capsule by mouth daily.    . nitroGLYCERIN (NITROSTAT) 0.4 MG SL tablet Place 1 tablet (0.4 mg total) under the tongue every 5 (five) minutes as needed for chest pain. 25 tablet 3  . pantoprazole (PROTONIX) 40 MG tablet TAKE 1 TABLET (40 MG TOTAL) BY MOUTH DAILY. 90 tablet 1  . rivaroxaban (XARELTO) 20 MG TABS tablet Take 20 mg by mouth daily with supper.    . zolpidem (AMBIEN) 5 MG tablet Take 5 mg by mouth as directed. Pt takes 2.5 mg     No current facility-administered medications for this visit.    Allergies:   Amiodarone and Zyban   Social History:  The patient  reports that she quit smoking about 6 years ago. Her smoking use included Cigarettes. She has a 13.2 pack-year smoking history. She has never used smokeless tobacco. She reports that she drinks alcohol. She reports that she does not use illicit drugs.   Family History:  The patient's  family history includes Breast cancer in her paternal aunt; Gout in her father; Heart disease in her sister; Lung cancer in her father.    ROS:  Please see the history of present illness.   All other systems are reviewed and negative.    PHYSICAL EXAM: VS:  BP 122/72 mmHg  Pulse 61  Ht 5' 3.5" (1.613 m)  Wt 214 lb 6.4 oz (97.251 kg)  BMI 37.38 kg/m2 , BMI Body mass index is 37.38 kg/(m^2). GEN: Well nourished, well developed, in no acute distress HEENT: normal Neck: no JVD, carotid bruits, or masses Cardiac: RRR; no murmurs, rubs, or gallops,no edema  Respiratory:  clear to  auscultation bilaterally, normal work of breathing GI: soft, nontender, nondistended, + BS MS: no deformity or atrophy Skin: warm and dry  Neuro:  Strength and sensation are intact Psych: euthymic mood, full affect  EKG:  EKG is ordered today. The ekg ordered today shows sinus rhythm 61 bpm, LVH with repolarization abnormality   Recent Labs: 10/31/2014: TSH 7.828* 04/05/2015: ALT 27 07/18/2015: BUN 21*; Creatinine, Ser 1.36*; Hemoglobin 13.2; Platelets 218; Potassium 4.1; Sodium 142    Lipid Panel     Component Value Date/Time   CHOL 137 09/27/2014 0946   TRIG 76.0 09/27/2014 0946   HDL 49.50 09/27/2014 0946   CHOLHDL 3 09/27/2014 0946  VLDL 15.2 09/27/2014 0946   LDLCALC 72 09/27/2014 0946   LDLDIRECT 115.0 06/09/2014 1449     Wt Readings from Last 3 Encounters:  08/03/15 214 lb 6.4 oz (97.251 kg)  05/24/15 215 lb 9.6 oz (97.796 kg)  04/24/15 213 lb (96.616 kg)      Other studies Reviewed: Additional studies/ records that were reviewed today include: AF clinic notes,   Review of the above records today demonstrates: as above   ASSESSMENT AND PLAN:  1.  Persistent aifb/ atypical atrial flutter Doing well s/p ablation with some ERAF Continue current therapy  2. overwight Weight loss advised Body mass index is 37.38 kg/(m^2). She is not ready to make lifestyle change  3. HTN Stable No change required today  4. Hypertrophic CM Stable No change required today   Current medicines are reviewed at length with the patient today.   The patient does not have concerns regarding her medicines.  The following changes were made today:  none   Signed, [name], MD  08/03/2015 2:36 PM     [place] [id number] [phone] (office) [phone] (fax)

## 2015-08-08 NOTE — Addendum Note (Signed)
Addended by: Freada Bergeron on: 08/08/2015 05:25 PM   Modules accepted: Orders

## 2015-08-20 ENCOUNTER — Other Ambulatory Visit: Payer: Self-pay | Admitting: Cardiology

## 2015-08-20 NOTE — Telephone Encounter (Signed)
Rx request sent to pharmacy.  

## 2015-09-05 ENCOUNTER — Other Ambulatory Visit: Payer: Self-pay | Admitting: Cardiology

## 2015-09-11 DIAGNOSIS — S20221A Contusion of right back wall of thorax, initial encounter: Secondary | ICD-10-CM | POA: Diagnosis not present

## 2015-09-16 ENCOUNTER — Other Ambulatory Visit: Payer: Self-pay | Admitting: Cardiology

## 2015-09-20 ENCOUNTER — Telehealth: Payer: Self-pay | Admitting: Cardiology

## 2015-09-20 NOTE — Telephone Encounter (Signed)
Clarified with Dr. Radford Pax and she does want pt to be seen on 6/29. Spoke with pt and advised her Dr. Radford Pax does want her to keep the appt. Pt verbalized understanding and was in agreement with this plan.

## 2015-09-20 NOTE — Telephone Encounter (Signed)
New Message   *STAT* If patient is at the pharmacy, call can be transferred to refill team.   1. Which medications need to be refilled? (please list name of each medication and dose if known) Xarelto   2. Which pharmacy/location (including street and city if local pharmacy) is medication to be sent to? [facility] [address]  3. Do they need a 30 day or 90 day supply? 30 day supply  Pt now has appt scheduled for 10/04/2015 @ 8:30a

## 2015-09-20 NOTE — Telephone Encounter (Signed)
Spoke with pt and she denies any cardiac issues at this time. Pt wanted to know if 6/29 appt was necessary. Pt seen by Kerin Ransom, PA 07/2014 and was told to f/u with Dr. Radford Pax in 3-4 months. Pt has been seen in Afib clinic and by Dr. Rayann Heman since then.  Advised pt that I would see if Dr. Radford Pax was ok with her holding off or if she needed to be seen. Pt verbalized understanding.

## 2015-09-20 NOTE — Telephone Encounter (Signed)
yes 

## 2015-09-20 NOTE — Telephone Encounter (Signed)
Patient questioning whether or not she needs to keep 10/04/15 appointment. She is scheduled to see Angelena Form.

## 2015-09-20 NOTE — Telephone Encounter (Signed)
Medication Detail      Disp Refills Start End     XARELTO 20 MG TABS tablet 30 tablet 10 09/18/2015     Sig: TAKE 1 TABLET BY MOUTH EVERY DAY (PT NEEDS APPOINTMENT)    E-Prescribing Status: Receipt confirmed by pharmacy (09/18/2015 3:22 PM EDT)     Pharmacy    [id number] [place], [place] - [address]    Was already sent to the pharmacy as above.

## 2015-09-27 NOTE — Progress Notes (Signed)
Cardiology Office Note    Date:  10/04/2015   ID:  ANJELI CASAD, DOB Sep 19, 1945, MRN [id number]  PCP:  Aretta Nip, MD  Cardiologist:  Dr. Radford Pax Dr. Rayann Heman (EP)  CC: med refills  History of Present Illness:  Amy [name] is a 70 y.o. female with a history of HTN, HLD, chronic diastolic CHF, ASCAD with cath 7/3 with non obstructive ASCD - medical management, apical variant hypertrophic CM, OSA on BiPAP and PAFib/flutter s/p ablation (04/2015) on Xarelto who presents to clinic for refills on her Xarelto.   Per review of notes, LHC in 10/2011 demonstrated non-obstructive disease and an 80% lesion in a small D1 treated medically. She was admitted in 02/2012 for a NSTEMI in the setting of AFib with RVR. This was felt to be demand ischemia. She has had bradycardia in NSR precluding the use of rate controlling medications. She had evidence of apical hypertrophy on Echo at that time and QT prolongation. The only antiarrhythmic option was felt to be amiodarone. She was on this until 05/2013 when she was seen for SOB. [place] 05/2013 showed mild PAH with normal PCWP and RA pressure, could be related to OSA and low oxygen saturation. Treatment of OSA was encouraged. She was evaluated by Dr. Gwenette Greet and it was felt that her SOB may be due to [facility] toxicity with elevated ESR thus amiodarone was discontinued. She had a nuc 10/2013 to rule out ischemia as a contributing factor which was normal. SOB did improve somewhat after coming off amiodarone. She was seen in the ER 06/2014 for palpitations and was found to be in atrial fib vs flutter - EDP gave her flecainide and diltiazem which converted her.The patient saw Dr. Radford Pax in follow-up who placed a 30-day monitor and performed a repeat [place] nuc 09/12/14 which was normal without scar or ischemia, EF normal. The 30-day event monitor reportedly showed atrial flutter/possible atrial fibrillation. She was referred to the afib clinic 10/23/14. Per Donna's  notes, the patient took her metoprolol and converted to SR quickly over a matter of 1-2 hours.Last echo 04/2013: mild LVH, EF 55-60%, mid MR, mod dilated LA (prior echo 2013 showed "mild concentric hypertrophy with associated apicalthickening/hypertrophy giving the appearance of apicalpredominant hypertrophic cardiomyopathy").   Cardiac MRI was ordered in 10/2014 for evaluation of possible HCM as noted on previous echocardiogram. This showed a constellation of findings consistent with mid/apical hypertrophic cardiomyopathy with spade-like ventricle and mid and apical cavity obliteration in systole. She also hadsevere atrial enlargement by MRI. She recently underwent afib ablation by Dr. Rayann Heman in 04/2015.She was noted to have multiple atypical flutter circuits not amenable to ablation at that time. She has had some ERAF but has actually done rather well since her ablation.   Today she presents to clinic for follow up. For the last 3 weeks she has noticed some mild dizziness when she stands up too fast. No CP. She does have continued exertional SOB. No palpitations. She did have a fall on 09/08/15 while using a broke handicap chair in the shower at a hotel. She did have some bruising and injured her shoulder. She is going to be doing physical therapy. No LE edema, orthopnea or PND. No syncope. No blood in her stool or urine. She does think the Multaq does give her loose watery stool. Taking lasix PRN.    Past Medical History  Diagnosis Date  . Hypertension   . Hyperlipidemia   . Hypothyroid   . COPD (chronic obstructive  pulmonary disease) ([place])   . Dizziness     had PT for being off balance - in approx. 2012  . Lumbar disc disease   . Macular degeneration   . Chronic diastolic heart failure ([place])   . Apical variant hypertrophic cardiomyopathy ([place])     Diagnosed by echo and ECG 11/13  . GERD (gastroesophageal reflux disease)   . Persistent atrial fibrillation ([place])        . Sinus bradycardia    . CAD (coronary artery disease)     a. LHC in 10/2011 demonstrated non-obstructive disease and an 80% lesion in a small D1 treated medically.   . OSA (obstructive sleep apnea)   . CKD (chronic kidney disease), stage III   . Pulmonary hypertension ([place])     a. [place] 05/2013 showed mild PAH with normal PCWP and RA pressure, could be related to OSA and low oxygen saturation.   Marland Kitchen Atypical atrial flutter ([place])   . Refusal of blood transfusions as patient is Jehovah's Witness     Past Surgical History  Procedure Laterality Date  . Cesarean section  1983  . Cardiac catheterization      normal coronary arteries  . Cataract extraction Bilateral   . Right heart catheterization N/A 06/01/2013    Procedure: RIGHT HEART CATH;  Surgeon: Larey Dresser, MD;  Location:  [facility] CATH LAB;  Service: Cardiovascular;  Laterality: N/A;  . Cardiac catheterization N/A 10/31/2014    Procedure: Left Heart Cath and Coronary Angiography;  Surgeon: Leonie Man, MD;  Location: [facility] CV LAB;  Service: Cardiovascular;  Laterality: N/A;  . [place] without cardioversion N/A 04/23/2015    Procedure: TRANSESOPHAGEAL ECHOCARDIOGRAM (TEE);  Surgeon: Josue Hector, MD;  Location: [facility] ENDOSCOPY;  Service: Cardiovascular;  Laterality: N/A;  . Electrophysiologic study N/A 04/24/2015    Procedure: Atrial Fibrillation Ablation;  Surgeon:  Grayer, MD;  Location: [facility] CV LAB;  Service: Cardiovascular;  Laterality: N/A;    Current Medications: Outpatient Prescriptions Prior to Visit  Medication Sig Dispense Refill  . acetaminophen (TYLENOL) 500 MG tablet Take 500 mg by mouth daily as needed (pain). Counteract by [place]    . albuterol (PROVENTIL HFA;VENTOLIN HFA) 108 (90 BASE) MCG/ACT inhaler Inhale 2 puffs into the lungs every 6 (six) hours as needed for wheezing or shortness of breath.     Marland Kitchen atorvastatin (LIPITOR) 80 MG tablet Take 1 tablet (80 mg total) by mouth daily with lunch. 90 tablet 3  . Cholecalciferol  (VITAMIN D) 2000 UNITS CAPS Take 2,000 Units by mouth daily.     . colchicine 0.6 MG tablet Take 0.6 mg by mouth every other day.     . ezetimibe (ZETIA) 10 MG tablet TAKE 1 TABLET (10 MG TOTAL) BY MOUTH DAILY. 90 tablet 3  . KRILL OIL PO Take 1 capsule by mouth daily.    Marland Kitchen levothyroxine (SYNTHROID, LEVOTHROID) 75 MCG tablet Take 75 mcg by mouth daily before breakfast.    . MULTAQ 400 MG tablet TAKE 1 TABLET BY MOUTH TWICE A DAY WITH A MEAL 60 tablet 6  . Multiple Vitamin (MULTIVITAMIN) capsule Take 1 capsule by mouth daily.    . nitroGLYCERIN (NITROSTAT) 0.4 MG SL tablet Place 1 tablet (0.4 mg total) under the tongue every 5 (five) minutes as needed for chest pain. 25 tablet 3  . pantoprazole (PROTONIX) 40 MG tablet TAKE 1 TABLET (40 MG TOTAL) BY MOUTH DAILY. 90 tablet 1  . metoprolol tartrate (LOPRESSOR) 25 MG tablet Take  0.5 tablets (12.5 mg total) by mouth 2 (two) times daily. 30 tablet 0  . XARELTO 20 MG TABS tablet TAKE 1 TABLET BY MOUTH EVERY DAY (PT NEEDS APPOINTMENT) 30 tablet 10  . furosemide (LASIX) 20 MG tablet TAKE 1 TABLET (20 MG TOTAL) BY MOUTH 2 (TWO) TIMES DAILY. (PLEASE CALL AND SCHEDULE APPT WITH MD) 60 tablet 10  . metoprolol tartrate (LOPRESSOR) 25 MG tablet Take 0.5 tablets (12.5 mg total) by mouth 2 (two) times daily. Please call and schedule an appointment with Dr Radford Pax (Patient taking differently: Take 12.5 mg by mouth 2 (two) times daily. ) 30 tablet 0  . zolpidem (AMBIEN) 5 MG tablet Take 5 mg by mouth as directed. Pt takes 2.5 mg     No facility-administered medications prior to visit.     Allergies:   Amiodarone and Zyban   Social History   Social History  . Marital Status: Widowed    Spouse Name: divorced.  . Number of Children: 1  . Years of Education: N/A   Occupational History  . retired. still works for [employer].     Social History Main Topics  . Smoking status: Former Smoker -- 0.30 packs/day for 44 years    Types: Cigarettes    Quit date: 04/07/2009   . Smokeless tobacco: Never Used  . Alcohol Use: Yes     Comment: 2 to 3 glasses wine per week  . Drug Use: No  . Sexual Activity: No   Other Topics Concern  . None   Social History Narrative   Pt lives alone with 2 dogs.      Family History:  The patient's family history includes Breast cancer in her paternal aunt; Gout in her father; Heart disease in her sister; Lung cancer in her father.    ROS:   Please see the history of present illness.    ROS All other systems reviewed and are negative.   PHYSICAL EXAM:   VS:  BP 112/70 mmHg  Pulse 55  Ht 5' 3.5" (1.613 m)  Wt 211 lb 12.8 oz (96.072 kg)  BMI 36.93 kg/m2  SpO2 90%   GEN: Well nourished, well developed, in no acute distressobese HEENT: normal Neck: no JVD, carotid bruits, or masses Cardiac: RRR; no murmurs, rubs, or gallops,no edema  Respiratory:  clear to auscultation bilaterally, normal work of breathing GI: soft, nontender, nondistended, + BS MS: no deformity or atrophy Skin: warm and dry, no rash Neuro:  Alert and Oriented x 3, Strength and sensation are intact Psych: euthymic mood, full affect  Wt Readings from Last 3 Encounters:  10/04/15 211 lb 12.8 oz (96.072 kg)  08/03/15 214 lb 6.4 oz (97.251 kg)  05/24/15 215 lb 9.6 oz (97.796 kg)      Studies/Labs Reviewed:   EKG:  EKG is ordered today.  The ekg ordered today demonstrates sinus brady HR 56, LVH with repol abnormality.   Recent Labs: 10/31/2014: TSH 7.828* 04/05/2015: ALT 27 07/18/2015: BUN 21*; Creatinine, Ser 1.36*; Hemoglobin 13.2; Platelets 218; Potassium 4.1; Sodium 142   Lipid Panel    Component Value Date/Time   CHOL 137 09/27/2014 0946   TRIG 76.0 09/27/2014 0946   HDL 49.50 09/27/2014 0946   CHOLHDL 3 09/27/2014 0946   VLDL 15.2 09/27/2014 0946   LDLCALC 72 09/27/2014 0946   LDLDIRECT 115.0 06/09/2014 1449    Additional studies/ records that were reviewed today include:  Procedures 10/2014   Left Heart Cath and Coronary  Angiography  Conclusion    1. Ost 1st Diag to 1st Diag lesion, 75% stenosed. This represents mild progression of disease but otherwise no significant disease throughout the coronary system. 2. Demand Ischemia the Myocardium - related to existing disease, hypertrophic cardiomyopathy and atrial flutter with rapid ventricular rate 3. Mildly elevated LVEDP  Moderate single-vessel disease and a small caliber diagonal branch which is only mildly progressed from 2013. This would suggest that her troponin level is related to Demand Ischemia of the myocardium.  Recommendations:  Standard post diagnostic cath TR band removal.  Restart Xarelto this evening.  Continue medical management per primary service and EP  I tried to contact the patient's daughter, Lanelle Bal at the telephone number provided. I reached an answering machine, and left a message stating that we will try to re- contact her.    TEE: 04/23/2015 LV EF: 60% - 65% Study Conclusions - Left ventricle: The cavity size was normal. Wall thickness was  normal. Systolic function was normal. The estimated ejection  fraction was in the range of 60% to 65%. - Aortic valve: There was mild regurgitation. - Mitral valve: There was mild regurgitation. - Left atrium: The atrium was moderately dilated. No evidence of  thrombus in the atrial cavity or appendage. - Right atrium: No evidence of thrombus in the atrial cavity or  appendage. - Atrial septum: Echo contrast study showed no right-to-left atrial  level shunt, at baseline or with provocation. - Tricuspid valve: There was moderate regurgitation.  Cardiac MR 10/2014 IMPRESSION: 1) Constellation of findings consistent with mid/apical hypertrophic cardiomyopathy. With spade-like ventricle and mid and apical cavity obliteration in systole 2) Mild mid myocardial gadolinium uptake in LV apex 3) Severe LAE likely indicating high LVEDP 4) Hyperdynamic EF 79%   ASSESSMENT &  PLAN:   PAFib/flutter: s/p ablation by Dr. Rayann Heman in 04/2015. CHADSVASC score of at least 5 (CHF, HTN, age, F sex, CAD). Conitnue Xarelto.   CAD: No angina. Nuclear stress test 10/2013 showed no ischemia. She is not on aspirin as she is on Xarelto. She cannot tolerate beta blocker due to bradycardia. Continue statin.  Hypertension: Controlled.  Sleep apnea: She remains on BiPAP and doing well with it.  Obesity: Body mass index is 36.93 kg/(m^2). Diet and weight loss recommended.   Chronic Diastolic CHF: Volume appears stable. Continue current therapy.  Dyslipidemia: continue statin   Apical predominant hypertrophic cardiomyopathy: no BB or CCB due to resting sinus bradycardia. Continue to monitor.   SOB/DOE: this is chronic. O2 sats did not drop below 88 with ambulation. She has seen Dr.   Medication Adjustments/Labs and Tests Ordered: Current medicines are reviewed at length with the patient today.  Concerns regarding medicines are outlined above.  Medication changes, Labs and Tests ordered today are listed in the Patient Instructions below. Patient Instructions  Medication Instructions:  Your physician recommends that you continue on your current medications as directed. Please refer to the Current Medication list given to you today.   Labwork: None ordered  Testing/Procedures: None ordered  Follow-Up: Your physician wants you to follow-up in: [place].  You will receive a reminder letter in the mail two months in advance. If you don't receive a letter, please call our office to schedule the follow-up appointment.    Any Other Special Instructions Will Be Listed Below (If Applicable).    If you need a refill on your cardiac medications before your next appointment, please call your pharmacy.  Signed, Angelena Form, PA-C  10/04/2015 9:16 AM    [facility] [place], [place], [place]  [address] Phone: [phone];  Fax: [phone]

## 2015-09-30 DIAGNOSIS — S46001A Unspecified injury of muscle(s) and tendon(s) of the rotator cuff of right shoulder, initial encounter: Secondary | ICD-10-CM | POA: Diagnosis not present

## 2015-09-30 DIAGNOSIS — W19XXXD Unspecified fall, subsequent encounter: Secondary | ICD-10-CM | POA: Diagnosis not present

## 2015-09-30 DIAGNOSIS — M7551 Bursitis of right shoulder: Secondary | ICD-10-CM | POA: Diagnosis not present

## 2015-10-04 ENCOUNTER — Encounter: Payer: Self-pay | Admitting: Physician Assistant

## 2015-10-04 ENCOUNTER — Ambulatory Visit (INDEPENDENT_AMBULATORY_CARE_PROVIDER_SITE_OTHER): Payer: Medicare Other | Admitting: Physician Assistant

## 2015-10-04 VITALS — BP 112/70 | HR 55 | Ht 63.5 in | Wt 211.8 lb

## 2015-10-04 DIAGNOSIS — I5032 Chronic diastolic (congestive) heart failure: Secondary | ICD-10-CM | POA: Diagnosis not present

## 2015-10-04 DIAGNOSIS — I480 Paroxysmal atrial fibrillation: Secondary | ICD-10-CM | POA: Diagnosis not present

## 2015-10-04 DIAGNOSIS — I422 Other hypertrophic cardiomyopathy: Secondary | ICD-10-CM | POA: Diagnosis not present

## 2015-10-04 DIAGNOSIS — I25118 Atherosclerotic heart disease of native coronary artery with other forms of angina pectoris: Secondary | ICD-10-CM

## 2015-10-04 DIAGNOSIS — I10 Essential (primary) hypertension: Secondary | ICD-10-CM | POA: Diagnosis not present

## 2015-10-04 DIAGNOSIS — R001 Bradycardia, unspecified: Secondary | ICD-10-CM

## 2015-10-04 MED ORDER — METOPROLOL TARTRATE 25 MG PO TABS: 12.5000 mg | ORAL_TABLET | Freq: Two times a day (BID) | ORAL | Status: DC

## 2015-10-04 MED ORDER — RIVAROXABAN 20 MG PO TABS: ORAL_TABLET | ORAL | Status: DC

## 2015-10-04 MED ORDER — FUROSEMIDE 20 MG PO TABS: 20.0000 mg | ORAL_TABLET | Freq: Two times a day (BID) | ORAL | Status: DC

## 2015-10-04 NOTE — Patient Instructions (Addendum)
Medication Instructions:  Your physician recommends that you continue on your current medications as directed. Please refer to the Current Medication list given to you today.   Labwork: None ordered  Testing/Procedures: None ordered  Follow-Up: Your physician wants you to follow-up in: [place].  You will receive a reminder letter in the mail two months in advance. If you don't receive a letter, please call our office to schedule the follow-up appointment.    Any Other Special Instructions Will Be Listed Below (If Applicable).    If you need a refill on your cardiac medications before your next appointment, please call your pharmacy.

## 2015-10-13 ENCOUNTER — Other Ambulatory Visit: Payer: Self-pay | Admitting: Cardiology

## 2015-10-16 ENCOUNTER — Ambulatory Visit: Payer: Medicare Other | Attending: Family Medicine

## 2015-10-16 DIAGNOSIS — M6281 Muscle weakness (generalized): Secondary | ICD-10-CM

## 2015-10-16 DIAGNOSIS — M25511 Pain in right shoulder: Secondary | ICD-10-CM | POA: Insufficient documentation

## 2015-10-16 DIAGNOSIS — R293 Abnormal posture: Secondary | ICD-10-CM | POA: Insufficient documentation

## 2015-10-16 DIAGNOSIS — M25612 Stiffness of left shoulder, not elsewhere classified: Secondary | ICD-10-CM | POA: Diagnosis not present

## 2015-10-16 DIAGNOSIS — M25512 Pain in left shoulder: Secondary | ICD-10-CM | POA: Diagnosis not present

## 2015-10-16 NOTE — Patient Instructions (Signed)
Hold all stretches 5-10 seconds and perform 5-10 times, 3 times a day.   Slide right arm up wall, with  PALM FACING THE WALL, by leaning toward wall.  Copyright  VHI. All rights reserved.   Sitting upright, slide forearm forward along table, bending from the waist until a stretch is felt. Copyright  VHI. All rights reserved.    Clasp hands together and raise arms above head, keeping elbows as straight as possible. Can be done sitting or lying.   Copyright  VHI. All rights reserved.  Scapular Retraction: Rowing (Eccentric) - Arms - Side (Resistance Band)   No band.   Pull back until elbows are even with trunk. Keep elbows by sides, thumbs up. Slowly release for 3-5 seconds. 5 reps.  Many times a day.    Copyright  VHI. All rights reserved.   Posture - Standing   Good posture is important. Avoid slouching and forward head thrust. Maintain curve in low back and align ears over shoulders, hips over ankles.  Pull your belly button in toward your back bone. Posture Tips DO: - stand tall and erect - keep chin tucked in - keep head and shoulders in alignment - check posture regularly in mirror or large window - pull head back against headrest in car seat;  Change your position often.  Sit with lumbar support. DON'T: - slouch or slump while watching TV or reading - sit, stand or lie in one position  for too long;  Sitting is especially hard on the spine so if you sit at a desk/use the computer, then stand up often! Copyright  VHI. All rights reserved.  Posture - Sitting  Sit upright, head facing forward. Try using a roll to support lower back. Keep shoulders relaxed, and avoid rounded back. Keep hips level with knees. Avoid crossing legs for long periods. Copyright  VHI. All rights reserved.  Chronic neck strain can develop because of poor posture and faulty work habits  Postural strain related to slumped sitting and forward head posture is a leading cause of headaches, neck and upper  back pain  General strengthening and flexibility exercises are helpful in the treatment of neck pain.  Most importantly, you should learn to correct the posture that may be contributing to chronic pain.   Change positions frequently  Change your work or home environment to improve posture and mechanics.   [facility] [address] [place], [place] [address] Phone # [phone] Fax [phone]

## 2015-10-16 NOTE — Therapy (Signed)
[facility] [address] [place], [place], [address] Phone: [phone]   Fax:  [phone]  Physical Therapy Evaluation  Patient Details  Name: Amy [name] MRN: [id number] Date of Birth: 1945-05-21 Referring Provider: Cari Caraway, MD  Encounter Date: 10/16/2015      PT End of Session - 10/16/15 1220    Visit Number 1   Number of Visits 10   Date for PT Re-Evaluation 12/11/15   PT Start Time 8768   PT Stop Time 1223   PT Time Calculation (min) 36 min   Activity Tolerance Patient tolerated treatment well   Behavior During Therapy [facility] for tasks assessed/performed      Past Medical History  Diagnosis Date  . Hypertension   . Hyperlipidemia   . Hypothyroid   . COPD (chronic obstructive pulmonary disease) ([place])   . Dizziness     had PT for being off balance - in approx. 2012  . Lumbar disc disease   . Macular degeneration   . Chronic diastolic heart failure ([place])   . Apical variant hypertrophic cardiomyopathy ([place])     Diagnosed by echo and ECG 11/13  . GERD (gastroesophageal reflux disease)   . Persistent atrial fibrillation ([place])        . Sinus bradycardia   . CAD (coronary artery disease)     a. LHC in 10/2011 demonstrated non-obstructive disease and an 80% lesion in a small D1 treated medically.   . OSA (obstructive sleep apnea)   . CKD (chronic kidney disease), stage III   . Pulmonary hypertension ([place])     a. [place] 05/2013 showed mild PAH with normal PCWP and RA pressure, could be related to OSA and low oxygen saturation.   Marland Kitchen Atypical atrial flutter ([place])   . Refusal of blood transfusions as patient is Jehovah's Witness     Past Surgical History  Procedure Laterality Date  . Cesarean section  1983  . Cardiac catheterization      normal coronary arteries  . Cataract extraction Bilateral   . Right heart catheterization N/A 06/01/2013    Procedure: RIGHT HEART CATH;  Surgeon: Larey Dresser, MD;  Location: [facility] CATH LAB;  Service: Cardiovascular;  Laterality: N/A;  . Cardiac catheterization N/A 10/31/2014    Procedure: Left Heart Cath and Coronary Angiography;  Surgeon: Leonie Man, MD;  Location: [place] CV LAB;  Service: Cardiovascular;  Laterality: N/A;  . Tee without cardioversion N/A 04/23/2015    Procedure: TRANSESOPHAGEAL ECHOCARDIOGRAM (TEE);  Surgeon: Josue Hector, MD;  Location: [facility] ENDOSCOPY;  Service: Cardiovascular;  Laterality: N/A;  . Electrophysiologic study N/A 04/24/2015    Procedure: Atrial Fibrillation Ablation;  Surgeon: [name], MD;  Location: [place] CV LAB;  Service: Cardiovascular;  Laterality: N/A;    There were no vitals filed for this visit.       Subjective Assessment - 10/16/15 1154    Subjective Pt presents to PT with report that she had Rt shoulder pain as result of a fall in a hotel shouder 09/09/15.  Pt reports that Rt shoulder pain has resolved and she now has Lt shoulder and arm pain that began 1-2 weeks ago.     Pertinent History Fall in a hotel shower 09/09/15 resulting in Rt and Lt shoulder pain   Diagnostic tests x-ray: at hospital after fall.  Negative per pt report.     Patient Stated Goals reduce Lt shoulder pain   Currently in Pain?  Yes   Pain Score 3   No pain on on the Rt   Pain Location Shoulder   Pain Orientation Left   Pain Descriptors / Indicators Dull   Pain Type Acute pain   Pain Radiating Towards into Lt hand and arm   Pain Onset 1 to 4 weeks ago   Pain Frequency Intermittent   Aggravating Factors  midrange motion, reaching out to the side, reaching across   Pain Relieving Factors Tylenol, not reaching with the Lt arm            [facility] PT Assessment - 10/16/15 0001    Assessment   Medical Diagnosis Rt shoulder bursitis   Referring Provider Cari Caraway, MD   Onset Date/Surgical Date 09/09/15  Lt shoulder began 09/28/15   Hand Dominance Right   Next MD Visit none   Precautions   Precautions  None   Restrictions   Weight Bearing Restrictions No   Balance Screen   Has the patient fallen in the past 6 months Yes   How many times? 1  shower chair slipped   Has the patient had a decrease in activity level because of a fear of falling?  No   Is the patient reluctant to leave their home because of a fear of falling?  No   Home [place] residence   Living Arrangements Other relatives   Prior Function   Level of Independence Independent   Vocation Retired   Leisure none   Cognition   Overall Cognitive Status Within Functional Limits for tasks assessed   Observation/Other Assessments   Focus on Therapeutic Outcomes (FOTO)  52% limitation   Posture/Postural Control   Posture/Postural Control Postural limitations   Postural Limitations Rounded Shoulders;Forward head   ROM / Strength   AROM / PROM / Strength AROM;PROM;Strength   AROM   Overall AROM  Deficits   Overall AROM Comments Bil shoulder AROM limited by 10% in all directions.  Rt=Lt.  Pt reports Lt shoulder pain at end range of each motion   PROM   Overall PROM  Within functional limits for tasks performed   Strength   Overall Strength Deficits   Strength Assessment Site Shoulder   Right/Left Shoulder Right;Left   Right Shoulder Flexion 4+/5   Right Shoulder ABduction 4+/5   Right Shoulder Internal Rotation 4+/5   Right Shoulder External Rotation 4+/5   Left Shoulder Flexion 4/5   Left Shoulder ABduction 4-/5   Left Shoulder Internal Rotation 4+/5   Left Shoulder External Rotation 4-/5   Palpation   Palpation comment no palpable tenderness over the Rt shoulder or scapula, Lt shoulder with palpable tenderness over Lt anterior and posterior glenohumeral joint                           PT Education - 10/16/15 1214    Education provided Yes   Education Details HEP: shoulder AAROM, posture education   Person(s) Educated Patient   Methods  Explanation;Demonstration;Handout   Comprehension Verbalized understanding;Returned demonstration          PT Short Term Goals - 10/16/15 1228    PT SHORT TERM GOAL #1   Title be independent in initial HEP   Time 4   Period Weeks   Status New   PT SHORT TERM GOAL #2   Title report < or = to 3/10 Lt shoulder pain with reaching overhead and across the body  Time 4   Period Weeks   Status New   PT SHORT TERM GOAL #3   Title report a 30% reduction in bil. shoulder pain with use with ADLs and self-care   Time 4   Period Weeks   Status New           PT Long Term Goals - 10/16/15 1145    PT LONG TERM GOAL #1   Title be independent in advanced HEP   Time 8   Period Weeks   Status New   PT LONG TERM GOAL #2   Title reduce FOTO to < or = to 38% limitation   Time 8   Period Weeks   Status New   PT LONG TERM GOAL #3   Title demonstrate 4+/5 bilateral shoulder strength to improve endurance for use with self-care and ADLs   Time 8   Period Weeks   Status New   PT LONG TERM GOAL #4   Title report a 60% reduction in bil shoulder pain with use with ALDs and self-care   Time 8   Period Weeks   Status New   PT LONG TERM GOAL #5   Title report no limitations with use of Lt UE with overhead activity   Time 8   Period Weeks   Status New               Plan - 10/16/15 1223    Clinical Impression Statement Pt presents to PT with report of Rt shoulder pain that began with a fall in the shower at a hotel 09/09/15.  Pt was using a shower seat that slipped out from under her.  Pt reports that Rt shoulder pain has resolved and she now has Lt shoulder and arm pain that began ~2 weeks ago.  Pt demonstrates painful Lt shoulder pain and limited strength.  Pt has poor seated posture with forward head and rounded shoulders.  FOTO score is 52% limitation.  Pt with palpable tenderness over Lt anterior and posterior glenohumeral joint.  Pt will benefit from skilled PT for shoulder AROM,  strength, manual and modalities to reduce pain and improve use overhead.     Rehab Potential Good   PT Frequency 2x / week   PT Duration 8 weeks   PT Treatment/Interventions ADLs/Self Care Home Management;Cryotherapy;Electrical Stimulation;Moist Heat;Therapeutic exercise;Therapeutic activities;Ultrasound;Neuromuscular re-education;Patient/family education;Manual techniques;Taping;Dry needling;Passive range of motion   PT Next Visit Plan Treat Lt shoulder with ionto if MD signs order, Bil shoulder strength, postural strength, shoulder flexibility   Consulted and Agree with Plan of Care Patient      Patient will benefit from skilled therapeutic intervention in order to improve the following deficits and impairments:  Postural dysfunction, Decreased strength, Impaired flexibility, Pain, Decreased activity tolerance  Visit Diagnosis: Pain in left shoulder - Plan: PT plan of care cert/re-cert  Pain in right shoulder - Plan: PT plan of care cert/re-cert  Stiffness of left shoulder, not elsewhere classified - Plan: PT plan of care cert/re-cert  Muscle weakness (generalized) - Plan: PT plan of care cert/re-cert  Abnormal posture - Plan: PT plan of care cert/re-cert      G-Codes - 43/32/95 1148    Functional Assessment Tool Used FOTO: 52% limitation   Functional Limitation Other PT primary   Other PT Primary Current Status (J8841) At least 40 percent but less than 60 percent impaired, limited or restricted   Other PT Primary Goal Status (Y6063) At least 20 percent but less than 40 percent  impaired, limited or restricted       Problem List Patient Active Problem List   Diagnosis Date Noted  . A-fib ([place]) 04/24/2015  . Atrial flutter with rapid ventricular response ([place])   . Atrial fibrillation with rapid ventricular response ([place])   . Elevated troponin   . NSTEMI (non-ST elevated myocardial infarction) ([place]) 10/29/2014  . Chest pain 10/28/2014  . Atrial flutter with RVR 10/28/2014   . Obesity-BMI 38 07/19/2014  . Chronic anticoagulation 07/19/2014  . CAD (coronary artery disease) 07/19/2014  . Chronic renal insufficiency, stage III (moderate) 07/19/2014  . Dyslipidemia 04/12/2014  . Pulmonary hypertension ([place]) 05/12/2013  . OSA (obstructive sleep apnea) 04/13/2013  . Apical variant hypertrophic cardiomyopathy ([place]) 03/05/2012  . Chronic diastolic heart failure ([place])   . Paroxysmal atrial fibrillation/flutter   . Sinus bradycardia   . COPD (chronic obstructive pulmonary disease) ([place]) 03/04/2012  . Hypothyroidism 03/04/2012  . Essential hypertension 03/04/2012  . DOE (dyspnea on exertion) 12/24/2010     Sigurd Sos, PT 10/16/2015 12:41 PM  [facility] [address] [place], [place], [address] Phone: [phone]   Fax:  [phone]  Name: Amy [name] MRN: [id number] Date of Birth: [birth year]

## 2015-10-18 ENCOUNTER — Ambulatory Visit: Payer: Medicare Other | Admitting: Physical Therapy

## 2015-10-18 DIAGNOSIS — M25511 Pain in right shoulder: Secondary | ICD-10-CM

## 2015-10-18 DIAGNOSIS — R293 Abnormal posture: Secondary | ICD-10-CM | POA: Diagnosis not present

## 2015-10-18 DIAGNOSIS — M25512 Pain in left shoulder: Secondary | ICD-10-CM

## 2015-10-18 DIAGNOSIS — M25612 Stiffness of left shoulder, not elsewhere classified: Secondary | ICD-10-CM

## 2015-10-18 DIAGNOSIS — M6281 Muscle weakness (generalized): Secondary | ICD-10-CM | POA: Diagnosis not present

## 2015-10-18 NOTE — Therapy (Signed)
[facility] [address], [place], [place], [address] Phone: [phone]   Fax:  [phone]  Physical Therapy Treatment  Patient Details  Name: Amy [name] MRN: [id number] Date of Birth: 02-20-1946 Referring Provider: Cari Caraway, MD  Encounter Date: 10/18/2015      PT End of Session - 10/18/15 1424    Visit Number 2   Number of Visits 10   Date for PT Re-Evaluation 12/11/15   PT Start Time 1401   PT Stop Time 1440   PT Time Calculation (min) 39 min   Activity Tolerance Patient tolerated treatment well      Past Medical History  Diagnosis Date  . Hypertension   . Hyperlipidemia   . Hypothyroid   . COPD (chronic obstructive pulmonary disease) ([place])   . Dizziness     had PT for being off balance - in approx. 2012  . Lumbar disc disease   . Macular degeneration   . Chronic diastolic heart failure ([place])   . Apical variant hypertrophic cardiomyopathy ([place])     Diagnosed by echo and ECG 11/13  . GERD (gastroesophageal reflux disease)   . Persistent atrial fibrillation ([place])        . Sinus bradycardia   . CAD (coronary artery disease)     a. LHC in 10/2011 demonstrated non-obstructive disease and an 80% lesion in a small D1 treated medically.   . OSA (obstructive sleep apnea)   . CKD (chronic kidney disease), stage III   . Pulmonary hypertension ([place])     a. [place] 05/2013 showed mild PAH with normal PCWP and RA pressure, could be related to OSA and low oxygen saturation.   Marland Kitchen Atypical atrial flutter ([place])   . Refusal of blood transfusions as patient is Jehovah's Witness     Past Surgical History  Procedure Laterality Date  . Cesarean section  1983  . Cardiac catheterization      normal coronary arteries  . Cataract extraction Bilateral   . Right heart catheterization N/A 06/01/2013    Procedure: RIGHT HEART CATH;  Surgeon: Larey Dresser, MD;  Location: [facility] CATH LAB;  Service: Cardiovascular;   Laterality: N/A;  . Cardiac catheterization N/A 10/31/2014    Procedure: Left Heart Cath and Coronary Angiography;  Surgeon: Leonie Man, MD;  Location: [place] CV LAB;  Service: Cardiovascular;  Laterality: N/A;  . Tee without cardioversion N/A 04/23/2015    Procedure: TRANSESOPHAGEAL ECHOCARDIOGRAM (TEE);  Surgeon: Josue Hector, MD;  Location: [facility] ENDOSCOPY;  Service: Cardiovascular;  Laterality: N/A;  . Electrophysiologic study N/A 04/24/2015    Procedure: Atrial Fibrillation Ablation;  Surgeon: [name], MD;  Location: [place] CV LAB;  Service: Cardiovascular;  Laterality: N/A;    There were no vitals filed for this visit.      Subjective Assessment - 10/18/15 1403    Subjective My shoulder doesn't hurt, it's my back that hurts and feels stiff.   Sleeping on left side aggravates.  Not hurting today.    Currently in Pain? No/denies   Pain Score 0-No pain                         [facility] Adult PT Treatment/Exercise - 10/18/15 0001    Shoulder Exercises: Supine   Protraction AROM;Left;10 reps   Flexion AAROM;Both;10 reps   Shoulder Exercises: Sidelying   External Rotation AROM;Left;10 reps   Shoulder Exercises: Standing   Retraction AROM;Both;10 reps  Shoulder Exercises: ROM/Strengthening   Wall Pushups 10 reps   Ball on Wall bil UEs red ball 10x   Other ROM/Strengthening Exercises red ball roll at shoulder level on table 10x Right/left   Modalities   Modalities Electrical Stimulation;Moist Heat   Moist Heat Therapy   Number Minutes Moist Heat 15 Minutes   Moist Heat Location Shoulder  left   Electrical Stimulation   Electrical Stimulation Location left shoulder   Electrical Stimulation Action IFC   Electrical Stimulation Parameters 7 ma 15 min   Electrical Stimulation Goals Pain   Manual Therapy   Manual Therapy Joint mobilization   Joint Mobilization glenohumeral distraction, posterior and inferior mobs grade 3 at early and mob with movement  10x each;  scapular medial and lateral glides 10x grade 3;  manually resisted retraction/depression 10x                PT Education - 10/18/15 1434    Education provided Yes   Education Details yellow band rows and extensions   Person(s) Educated Patient   Methods Explanation;Demonstration;Handout   Comprehension Verbalized understanding;Returned demonstration          PT Short Term Goals - 10/18/15 1534    PT SHORT TERM GOAL #1   Title be independent in initial HEP   Time 4   Period Weeks   Status On-going   PT SHORT TERM GOAL #2   Title report < or = to 3/10 Lt shoulder pain with reaching overhead and across the body   Period Weeks   Status On-going   PT SHORT TERM GOAL #3   Title report a 30% reduction in bil. shoulder pain with use with ADLs and self-care   Time 4   Period Weeks   Status On-going           PT Long Term Goals - 10/18/15 1534    PT LONG TERM GOAL #1   Title be independent in advanced HEP   Time 8   Period Weeks   Status On-going   PT LONG TERM GOAL #2   Title reduce FOTO to < or = to 38% limitation   Time 8   Period Weeks   Status On-going   PT LONG TERM GOAL #3   Title demonstrate 4+/5 bilateral shoulder strength to improve endurance for use with self-care and ADLs   Time 8   Period Weeks   Status On-going   PT LONG TERM GOAL #4   Title report a 60% reduction in bil shoulder pain with use with ALDs and self-care   Time 8   Period Weeks   Status On-going   PT LONG TERM GOAL #5   Title report no limitations with use of Lt UE with overhead activity   Time 8   Period Weeks   Status On-going               Plan - 10/18/15 1441    Clinical Impression Statement The patient reports her hand ROM on left has greatly improved, initially she had difficulty full closing or opening her hand.  She is able to perform low level strengthening and ROM exercises with minimal pain produced and fully relieved with e-stim/heat following.   Verbal and tactile cues for postural alignment and to monitor pain.     PT Next Visit Plan Treat Lt shoulder with ionto if MD signs order or continue with e-stim/heat if helpful;  Bil shoulder strength, postural strength, shoulder flexibility;  review yellow  band ex's      Patient will benefit from skilled therapeutic intervention in order to improve the following deficits and impairments:     Visit Diagnosis: Pain in left shoulder  Pain in right shoulder  Stiffness of left shoulder, not elsewhere classified  Muscle weakness (generalized)  Abnormal posture     Problem List Patient Active Problem List   Diagnosis Date Noted  . A-fib ([place]) 04/24/2015  . Atrial flutter with rapid ventricular response ([place])   . Atrial fibrillation with rapid ventricular response ([place])   . Elevated troponin   . NSTEMI (non-ST elevated myocardial infarction) ([place]) 10/29/2014  . Chest pain 10/28/2014  . Atrial flutter with RVR 10/28/2014  . Obesity-BMI 38 07/19/2014  . Chronic anticoagulation 07/19/2014  . CAD (coronary artery disease) 07/19/2014  . Chronic renal insufficiency, stage III (moderate) 07/19/2014  . Dyslipidemia 04/12/2014  . Pulmonary hypertension ([place]) 05/12/2013  . OSA (obstructive sleep apnea) 04/13/2013  . Apical variant hypertrophic cardiomyopathy ([place]) 03/05/2012  . Chronic diastolic heart failure ([place])   . Paroxysmal atrial fibrillation/flutter   . Sinus bradycardia   . COPD (chronic obstructive pulmonary disease) ([place]) 03/04/2012  . Hypothyroidism 03/04/2012  . Essential hypertension 03/04/2012  . DOE (dyspnea on exertion) 12/24/2010     Ruben Im, PT 10/18/2015 3:40 PM Phone: [phone] Fax: [phone]  Alvera Singh 10/18/2015, 3:39 PM   [facility] [address] [place], [place], [address] Phone: [phone]   Fax:  [phone]  Name: Amy [name] MRN: [id number] Date of  Birth: 11-22-1945

## 2015-10-18 NOTE — Patient Instructions (Signed)
Ruben Im PT [facility] [address] [place],  [address] Phone # [phone] Fax [phone]

## 2015-10-19 ENCOUNTER — Other Ambulatory Visit (HOSPITAL_COMMUNITY): Payer: Self-pay | Admitting: Nurse Practitioner

## 2015-10-23 ENCOUNTER — Ambulatory Visit: Payer: Medicare Other

## 2015-10-23 DIAGNOSIS — M25512 Pain in left shoulder: Secondary | ICD-10-CM

## 2015-10-23 DIAGNOSIS — M25511 Pain in right shoulder: Secondary | ICD-10-CM | POA: Diagnosis not present

## 2015-10-23 DIAGNOSIS — R293 Abnormal posture: Secondary | ICD-10-CM

## 2015-10-23 DIAGNOSIS — M6281 Muscle weakness (generalized): Secondary | ICD-10-CM

## 2015-10-23 DIAGNOSIS — M25612 Stiffness of left shoulder, not elsewhere classified: Secondary | ICD-10-CM | POA: Diagnosis not present

## 2015-10-23 NOTE — Therapy (Signed)
[facility] [address], [place], [place], [address] Phone: [phone]   Fax:  [phone]  Physical Therapy Treatment  Patient Details  Name: Amy [name] MRN: [id number] Date of Birth: [birth year] Referring Provider: Cari Caraway, MD  Encounter Date: 10/23/2015      PT End of Session - 10/23/15 1425    Visit Number 3   Number of Visits 10   Date for PT Re-Evaluation 12/11/15   PT Start Time 1400   PT Stop Time 1456   PT Time Calculation (min) 56 min   Activity Tolerance Patient tolerated treatment well   Behavior During Therapy [facility] for tasks assessed/performed      Past Medical History  Diagnosis Date  . Hypertension   . Hyperlipidemia   . Hypothyroid   . COPD (chronic obstructive pulmonary disease) ([place])   . Dizziness     had PT for being off balance - in approx. 2012  . Lumbar disc disease   . Macular degeneration   . Chronic diastolic heart failure ([place])   . Apical variant hypertrophic cardiomyopathy ([place])     Diagnosed by echo and ECG 11/13  . GERD (gastroesophageal reflux disease)   . Persistent atrial fibrillation ([place])        . Sinus bradycardia   . CAD (coronary artery disease)     a. LHC in 10/2011 demonstrated non-obstructive disease and an 80% lesion in a small D1 treated medically.   . OSA (obstructive sleep apnea)   . CKD (chronic kidney disease), stage III   . Pulmonary hypertension ([place])     a. [place] 05/2013 showed mild PAH with normal PCWP and RA pressure, could be related to OSA and low oxygen saturation.   Marland Kitchen Atypical atrial flutter ([place])   . Refusal of blood transfusions as patient is Jehovah's Witness     Past Surgical History  Procedure Laterality Date  . Cesarean section  1983  . Cardiac catheterization      normal coronary arteries  . Cataract extraction Bilateral   . Right heart catheterization N/A 06/01/2013    Procedure: RIGHT HEART CATH;  Surgeon: Larey Dresser, MD;  Location: [facility] CATH LAB;  Service: Cardiovascular;  Laterality: N/A;  . Cardiac catheterization N/A 10/31/2014    Procedure: Left Heart Cath and Coronary Angiography;  Surgeon: Leonie Man, MD;  Location: [place] CV LAB;  Service: Cardiovascular;  Laterality: N/A;  . Tee without cardioversion N/A 04/23/2015    Procedure: TRANSESOPHAGEAL ECHOCARDIOGRAM (TEE);  Surgeon: Josue Hector, MD;  Location: [facility] ENDOSCOPY;  Service: Cardiovascular;  Laterality: N/A;  . Electrophysiologic study N/A 04/24/2015    Procedure: Atrial Fibrillation Ablation;  Surgeon: [name], MD;  Location: [place] CV LAB;  Service: Cardiovascular;  Laterality: N/A;    There were no vitals filed for this visit.      Subjective Assessment - 10/23/15 1405    Subjective My Lt shoulder is feeling about the same.     Currently in Pain? Yes  No Rt shoulder pain today   Pain Score 2    Pain Location Shoulder   Pain Orientation Left   Pain Descriptors / Indicators Dull   Pain Type Acute pain   Pain Onset 1 to 4 weeks ago   Pain Frequency Intermittent   Aggravating Factors  reaching out to the side and across the body, lifting with Lt UE   Pain Relieving Factors Tylenol, not reaching  [facility] Adult PT Treatment/Exercise - 10/23/15 0001    Exercises   Exercises Shoulder   Shoulder Exercises: Supine   Flexion AAROM;Both;20 reps   Shoulder Exercises: Sidelying   External Rotation AROM;Left;20 reps   Shoulder Exercises: Standing   Flexion Strengthening;Left;10 reps  using finger ladder   Extension Strengthening;Both;20 reps;Theraband   Theraband Level (Shoulder Extension) Level 1 (Yellow)   Row Strengthening;Both;20 reps;Theraband   Theraband Level (Shoulder Row) Level 1 (Yellow)   Shoulder Exercises: Pulleys   Flexion 3 minutes   Shoulder Exercises: ROM/Strengthening   UBE (Upper Arm Bike) Level 1x 6 minutes (3/3)   Modalities   Modalities Electrical  Stimulation;Ultrasound;Moist Heat   Moist Heat Therapy   Moist Heat Location Shoulder  left   Electrical Stimulation   Electrical Stimulation Location left shoulder   Electrical Stimulation Action IFC   Electrical Stimulation Parameters 15 minutes   Electrical Stimulation Goals Pain   Ultrasound   Ultrasound Location Lt glenohumeral joint   Ultrasound Parameters 1.2 w/cm2 50% pulsed to Lt glenohumeral joint x 8 minutes   Ultrasound Goals Pain                  PT Short Term Goals - 10/23/15 1404    PT SHORT TERM GOAL #1   Title be independent in initial HEP   Time 4   Period Weeks   Status On-going   PT SHORT TERM GOAL #2   Title report < or = to 3/10 Lt shoulder pain with reaching overhead and across the body   Time 4   Period Weeks   Status On-going   PT SHORT TERM GOAL #3   Title report a 30% reduction in bil. shoulder pain with use with ADLs and self-care   Time 4   Period Weeks   Status On-going           PT Long Term Goals - 10/18/15 1534    PT LONG TERM GOAL #1   Title be independent in advanced HEP   Time 8   Period Weeks   Status On-going   PT LONG TERM GOAL #2   Title reduce FOTO to < or = to 38% limitation   Time 8   Period Weeks   Status On-going   PT LONG TERM GOAL #3   Title demonstrate 4+/5 bilateral shoulder strength to improve endurance for use with self-care and ADLs   Time 8   Period Weeks   Status On-going   PT LONG TERM GOAL #4   Title report a 60% reduction in bil shoulder pain with use with ALDs and self-care   Time 8   Period Weeks   Status On-going   PT LONG TERM GOAL #5   Title report no limitations with use of Lt UE with overhead activity   Time 8   Period Weeks   Status On-going               Plan - 10/23/15 1406    Clinical Impression Statement Pt with continued Lt shoulder pain and rates it 2/10 today and 3/10 with overhead motion and reaching across the body.  Pt is independent and compliant in her HEP.   Pt will continue to benefit from skilled PT for Lt/Rt shoulder AROM and strength progression as tolerated and manual/modalities as needed.     Rehab Potential Good   PT Frequency 2x / week   PT Duration 8 weeks   PT Treatment/Interventions ADLs/Self Care Home Management;Cryotherapy;Electrical Stimulation;Moist  Heat;Therapeutic exercise;Therapeutic activities;Ultrasound;Neuromuscular re-education;Patient/family education;Manual techniques;Taping;Dry needling;Passive range of motion   PT Next Visit Plan Treat Lt shoulder with ionto if MD signs order or continue with e-stim/heat if helpful;  Bil shoulder strength, postural strength, shoulder flexibility   Consulted and Agree with Plan of Care Patient      Patient will benefit from skilled therapeutic intervention in order to improve the following deficits and impairments:  Postural dysfunction, Decreased strength, Impaired flexibility, Pain, Decreased activity tolerance  Visit Diagnosis: Pain in left shoulder  Pain in right shoulder  Stiffness of left shoulder, not elsewhere classified  Muscle weakness (generalized)  Abnormal posture     Problem List Patient Active Problem List   Diagnosis Date Noted  . A-fib ([place]) 04/24/2015  . Atrial flutter with rapid ventricular response ([place])   . Atrial fibrillation with rapid ventricular response ([place])   . Elevated troponin   . NSTEMI (non-ST elevated myocardial infarction) ([place]) 10/29/2014  . Chest pain 10/28/2014  . Atrial flutter with RVR 10/28/2014  . Obesity-BMI 38 07/19/2014  . Chronic anticoagulation 07/19/2014  . CAD (coronary artery disease) 07/19/2014  . Chronic renal insufficiency, stage III (moderate) 07/19/2014  . Dyslipidemia 04/12/2014  . Pulmonary hypertension ([place]) 05/12/2013  . OSA (obstructive sleep apnea) 04/13/2013  . Apical variant hypertrophic cardiomyopathy ([place]) 03/05/2012  . Chronic diastolic heart failure ([place])   . Paroxysmal atrial fibrillation/flutter   .  Sinus bradycardia   . COPD (chronic obstructive pulmonary disease) ([place]) 03/04/2012  . Hypothyroidism 03/04/2012  . Essential hypertension 03/04/2012  . DOE (dyspnea on exertion) 12/24/2010    Sigurd Sos, PT 10/23/2015 2:42 PM  [facility] [address], [place], [place], [address] Phone: [phone]   Fax:  [phone]  Name: Amy [name] MRN: [id number] Date of Birth: 1945/09/09

## 2015-10-25 ENCOUNTER — Ambulatory Visit: Payer: Medicare Other | Admitting: Physical Therapy

## 2015-10-25 DIAGNOSIS — M6281 Muscle weakness (generalized): Secondary | ICD-10-CM | POA: Diagnosis not present

## 2015-10-25 DIAGNOSIS — M25511 Pain in right shoulder: Secondary | ICD-10-CM

## 2015-10-25 DIAGNOSIS — M25612 Stiffness of left shoulder, not elsewhere classified: Secondary | ICD-10-CM | POA: Diagnosis not present

## 2015-10-25 DIAGNOSIS — M25512 Pain in left shoulder: Secondary | ICD-10-CM | POA: Diagnosis not present

## 2015-10-25 DIAGNOSIS — R293 Abnormal posture: Secondary | ICD-10-CM | POA: Diagnosis not present

## 2015-10-25 NOTE — Therapy (Signed)
[facility] [address], [place], [place], [address] Phone: [phone]   Fax:  [phone]  Physical Therapy Treatment  Patient Details  Name: Amy [name] MRN: [id number] Date of Birth: 28-Aug-1945 Referring Provider: Cari Caraway, MD  Encounter Date: 10/25/2015      PT End of Session - 10/25/15 1204    Visit Number 4   Number of Visits 10   Date for PT Re-Evaluation 12/11/15   PT Start Time 7628   PT Stop Time 3151   PT Time Calculation (min) 48 min   Activity Tolerance Patient tolerated treatment well      Past Medical History  Diagnosis Date  . Hypertension   . Hyperlipidemia   . Hypothyroid   . COPD (chronic obstructive pulmonary disease) ([place])   . Dizziness     had PT for being off balance - in approx. 2012  . Lumbar disc disease   . Macular degeneration   . Chronic diastolic heart failure ([place])   . Apical variant hypertrophic cardiomyopathy ([place])     Diagnosed by echo and ECG 11/13  . GERD (gastroesophageal reflux disease)   . Persistent atrial fibrillation ([place])        . Sinus bradycardia   . CAD (coronary artery disease)     a. LHC in 10/2011 demonstrated non-obstructive disease and an 80% lesion in a small D1 treated medically.   . OSA (obstructive sleep apnea)   . CKD (chronic kidney disease), stage III   . Pulmonary hypertension ([place])     a. [place] 05/2013 showed mild PAH with normal PCWP and RA pressure, could be related to OSA and low oxygen saturation.   Marland Kitchen Atypical atrial flutter ([place])   . Refusal of blood transfusions as patient is Jehovah's Witness     Past Surgical History  Procedure Laterality Date  . Cesarean section  1983  . Cardiac catheterization      normal coronary arteries  . Cataract extraction Bilateral   . Right heart catheterization N/A 06/01/2013    Procedure: RIGHT HEART CATH;  Surgeon: Larey Dresser, MD;  Location[facility] C[facility] LAB;  Service: Cardiovascular;   Laterality: N/A;  . Cardiac catheterization N/A 10/31/2014    Procedure: Left Heart Cath and Coronary Angiography;  Surgeon: Leonie Man, MD;  Location: [place] CV LAB;  Service: Cardiovascular;  Laterality: N/A;  . Tee without cardioversion N/A 04/23/2015    Procedure: TRANSESOPHAGEAL ECHOCARDIOGRAM (TEE);  Surgeon: Josue Hector, MD;  Location: [facility] ENDOSCOPY;  Service: Cardiovascular;  Laterality: N/A;  . Electrophysiologic study N/A 04/24/2015    Procedure: Atrial Fibrillation Ablation;  Surgeon: [name], MD;  Location: [place] CV LAB;  Service: Cardiovascular;  Laterality: N/A;    There were no vitals filed for this visit.      Subjective Assessment - 10/25/15 1154    Subjective My shoulder doesn't hurt it's my back.  It's stiff when I wake up.  I think the heat and e-stim helps.  I'm doing OK with the exs.     Currently in Pain? No/denies   Pain Score 0-No pain   Pain Location Shoulder   Pain Orientation Left                         [facility] Adult PT Treatment/Exercise - 10/25/15 0001    Shoulder Exercises: Seated   Internal Rotation Strengthening;Right;Left;20 reps;Theraband   Theraband Level (Shoulder Internal Rotation) Level  1 (Yellow)   Other Seated Exercises bil. shoulder flexion and extension with yellow band 20 times each   Shoulder Exercises: Standing   Extension Strengthening;Both;20 reps;Theraband   Theraband Level (Shoulder Extension) Level 1 (Yellow)   Row Strengthening;Both;20 reps;Theraband   Theraband Level (Shoulder Row) Level 1 (Yellow)   Other Standing Exercises wall push up 10x   Shoulder Exercises: ROM/Strengthening   UBE (Upper Arm Bike) Level 1x 6 minutes (3/3)   Other ROM/Strengthening Exercises green ball roll on table 20x right and left   Other ROM/Strengthening Exercises ball rolls on wall 10x   Modalities   Modalities Electrical Stimulation;Moist Heat   Moist Heat Therapy   Number Minutes Moist Heat 15 Minutes   Moist  Heat Location Shoulder  sitting   Electrical Stimulation   Electrical Stimulation Location left shoulder  sitting   Electrical Stimulation Action IFC   Electrical Stimulation Parameters 15 min, to patient tolerance   Electrical Stimulation Goals Pain                PT Education - 10/25/15 1226    Education provided No          PT Short Term Goals - 10/25/15 1209    PT SHORT TERM GOAL #1   Title be independent in initial HEP   Time 4   Period Weeks   Status On-going   PT SHORT TERM GOAL #2   Title report < or = to 3/10 Lt shoulder pain with reaching overhead and across the body   Time 4   Period Weeks   Status On-going   PT SHORT TERM GOAL #3   Title report a 30% reduction in bil. shoulder pain with use with ADLs and self-care   Time 4   Period Weeks   Status On-going           PT Long Term Goals - 10/25/15 1210    PT LONG TERM GOAL #1   Title be independent in advanced HEP   Time 8   Period Weeks   Status On-going   PT LONG TERM GOAL #2   Title reduce FOTO to < or = to 38% limitation   Time 8   Period Weeks   Status On-going   PT LONG TERM GOAL #3   Title demonstrate 4+/5 bilateral shoulder strength to improve endurance for use with self-care and ADLs   Time 8   Period Weeks   Status On-going   PT LONG TERM GOAL #4   Title report a 60% reduction in bil shoulder pain with use with ALDs and self-care   Time 8   Period Weeks   Status On-going   PT LONG TERM GOAL #5   Title report no limitations with use of Lt UE with overhead activity   Time 8   Period Weeks   Status On-going               Plan - 10/25/15 1205    Clinical Impression Statement No complaints of shoulder pain today.  Her low back bothers her more today.  She is able to perform a progression of exs with minimal to no pain.  Endrange overhead reaching does produce pain so modified to avoid endrange.  Therapist also cuing to activate periscapular muscles.     Rehab  Potential Good   PT Frequency 2x / week   PT Duration 8 weeks   PT Treatment/Interventions ADLs/Self Care Home Management;Cryotherapy;Electrical Stimulation;Moist Heat;Therapeutic exercise;Therapeutic activities;Ultrasound;Neuromuscular re-education;Patient/family education;Manual techniques;Taping;Dry  needling;Passive range of motion   PT Next Visit Plan Treat Lt shoulder with ionto if MD signs order or continue with e-stim/heat if helpful;  Bil shoulder strength, postural strength, shoulder flexibility   Consulted and Agree with Plan of Care Patient      Patient will benefit from skilled therapeutic intervention in order to improve the following deficits and impairments:  Postural dysfunction, Decreased strength, Impaired flexibility, Pain, Decreased activity tolerance  Visit Diagnosis: Pain in left shoulder  Pain in right shoulder  Stiffness of left shoulder, not elsewhere classified  Muscle weakness (generalized)     Problem List Patient Active Problem List   Diagnosis Date Noted  . A-fib ([place]) 04/24/2015  . Atrial flutter with rapid ventricular response ([place])   . Atrial fibrillation with rapid ventricular response ([place])   . Elevated troponin   . NSTEMI (non-ST elevated myocardial infarction) ([place]) 10/29/2014  . Chest pain 10/28/2014  . Atrial flutter with RVR 10/28/2014  . Obesity-BMI 38 07/19/2014  . Chronic anticoagulation 07/19/2014  . CAD (coronary artery disease) 07/19/2014  . Chronic renal insufficiency, stage III (moderate) 07/19/2014  . Dyslipidemia 04/12/2014  . Pulmonary hypertension ([place]) 05/12/2013  . OSA (obstructive sleep apnea) 04/13/2013  . Apical variant hypertrophic cardiomyopathy ([place]) 03/05/2012  . Chronic diastolic heart failure ([place])   . Paroxysmal atrial fibrillation/flutter   . Sinus bradycardia   . COPD (chronic obstructive pulmonary disease) ([place]) 03/04/2012  . Hypothyroidism 03/04/2012  . Essential hypertension 03/04/2012  . DOE (dyspnea  on exertion) 12/24/2010    Earlie Counts, PT 10/25/2015 12:28 PM  Ruben Im, PT 10/25/2015 12:43 PM Phone: [phone] Fax: [phone]  [facility] [address] [place], [place], [address] Phone: [phone]   Fax:  [phone]  Name: Amy [name] MRN: [id number] Date of Birth: [birth year]

## 2015-10-30 ENCOUNTER — Ambulatory Visit: Payer: Medicare Other | Admitting: Physical Therapy

## 2015-10-30 ENCOUNTER — Encounter: Payer: Self-pay | Admitting: Physical Therapy

## 2015-10-30 DIAGNOSIS — M25612 Stiffness of left shoulder, not elsewhere classified: Secondary | ICD-10-CM | POA: Diagnosis not present

## 2015-10-30 DIAGNOSIS — M6281 Muscle weakness (generalized): Secondary | ICD-10-CM

## 2015-10-30 DIAGNOSIS — M25512 Pain in left shoulder: Secondary | ICD-10-CM | POA: Diagnosis not present

## 2015-10-30 DIAGNOSIS — R293 Abnormal posture: Secondary | ICD-10-CM

## 2015-10-30 DIAGNOSIS — M25511 Pain in right shoulder: Secondary | ICD-10-CM | POA: Diagnosis not present

## 2015-10-30 NOTE — Therapy (Signed)
[facility] [address] [place], [place], [address] Phone: [phone]   Fax:  [phone]  Physical Therapy Treatment  Patient Details  Name: Amy [name] MRN: [id number] Date of Birth: 1945/12/18 Referring Provider: Cari Caraway, MD  Encounter Date: 10/30/2015      PT End of Session - 10/30/15 1446    Visit Number 5   Number of Visits 10   Date for PT Re-Evaluation 12/11/15   Authorization Type medicare g-code on 10th visit   PT Start Time 1400   PT Stop Time 1505   PT Time Calculation (min) 65 min   Activity Tolerance Patient tolerated treatment well   Behavior During Therapy [facility] for tasks assessed/performed      Past Medical History:  Diagnosis Date  . Apical variant hypertrophic cardiomyopathy ([place])    Diagnosed by echo and ECG 11/13  . Atypical atrial flutter ([place])   . CAD (coronary artery disease)    a. LHC in 10/2011 demonstrated non-obstructive disease and an 80% lesion in a small D1 treated medically.   . Chronic diastolic heart failure ([place])   . CKD (chronic kidney disease), stage III   . COPD (chronic obstructive pulmonary disease) ([place])   . Dizziness    had PT for being off balance - in approx. 2012  . GERD (gastroesophageal reflux disease)   . Hyperlipidemia   . Hypertension   . Hypothyroid   . Lumbar disc disease   . Macular degeneration   . OSA (obstructive sleep apnea)   . Persistent atrial fibrillation ([place])       . Pulmonary hypertension ([place])    a. [place] 05/2013 showed mild PAH with normal PCWP and RA pressure, could be related to OSA and low oxygen saturation.   . Refusal of blood transfusions as patient is Jehovah's Witness   . Sinus bradycardia     Past Surgical History:  Procedure Laterality Date  . CARDIAC CATHETERIZATION     normal coronary arteries  . CARDIAC CATHETERIZATION N/A 10/31/2014   Procedure: Left Heart Cath and Coronary Angiography;  Surgeon: Leonie Man, MD;  Location: [place] CV [facility];  Service: Cardiovascular;  Laterality: N/A;  . CATARACT EXTRACTION Bilateral   . CESAREAN SECTION  1983  . ELECTROPHYSIOLOGIC STUDY N/A 04/24/2015   Procedure: Atrial Fibrillation Ablation;  Surgeon: [name], MD;  Location: [place] [facility];  Service: Cardiovascular;  Laterality: N/A;  . RIGHT HEART CATHETERIZATION N/A 06/01/2013   Procedure: RIGHT HEART CATH;  Surgeon: Larey Dresser, MD;  Location: [facility] CATH LAB;  Service: Cardiovascular;  Laterality: N/A;  . TEE WITHOUT CARDIOVERSION N/A 04/23/2015   Procedure: TRANSESOPHAGEAL ECHOCARDIOGRAM (TEE);  Surgeon: Josue Hector, MD;  Location: [facility] ENDOSCOPY;  Service: Cardiovascular;  Laterality: N/A;    There were no vitals filed for this visit.      Subjective Assessment - 10/30/15 1412    Subjective My shoulder hurts when I move it. Pulling back with left arm hurts. No pain in left hand and arm anymore.    Pertinent History Fall in a hotel shower 09/09/15 resulting in Rt and Lt shoulder pain   Diagnostic tests x-ray: at hospital after fall.  Negative per pt report.     Patient Stated Goals reduce Lt shoulder pain   Currently in Pain? Yes   Pain Score 5    Pain Location Shoulder   Pain Orientation Left   Pain Descriptors / Indicators Dull   Pain  Type Acute pain   Pain Radiating Towards None   Pain Onset 1 to 4 weeks ago   Pain Frequency Intermittent   Aggravating Factors  reaching up and back. pain with sleeping on left side   Pain Relieving Factors tylenol, not reaching   Multiple Pain Sites No                         [facility] Adult PT Treatment/Exercise - 10/30/15 0001      Shoulder Exercises: Seated   Flexion Both;5 reps;Strengthening  while therapist guides the scapula   Abduction Both;Strengthening;10 reps  while PT uses tactle cues to relax upper trap   Other Seated Exercises left shoulder PNF movement to open up the left anterior shoulder     Shoulder  Exercises: Sidelying   Internal Rotation PROM;Left;5 reps     Shoulder Exercises: ROM/Strengthening   UBE (Upper Arm Bike) Level 1x 6 minutes (3/3)   Other ROM/Strengthening Exercises green ball roll on table 20x right and left     Modalities   Modalities Electrical Stimulation;Moist Heat     Moist Heat Therapy   Number Minutes Moist Heat 20 Minutes   Moist Heat Location Shoulder  sitting     Electrical Stimulation   Electrical Stimulation Location left shoulder  sitting     Manual Therapy   Manual Therapy Joint mobilization;Soft tissue mobilization   Manual therapy comments PROM to left shoulder for flexion and abduction while guiding the scapula   Joint Mobilization rotational mobilization to T1-T6 for  shoulder movement; lateral glide, inferior glide grade 3 to left shoulder   Soft tissue mobilization left subscapularis, left infraspinatus and teres minor, left RTC insertion, left A-C joint                PT Education - 10/30/15 1446    Education provided No          PT Short Term Goals - 10/30/15 1452      PT SHORT TERM GOAL #1   Title be independent in initial HEP   Time 4   Period Weeks   Status Achieved     PT SHORT TERM GOAL #2   Title report < or = to 3/10 Lt shoulder pain with reaching overhead and across the body   Time 4   Period Weeks   Status Achieved     PT SHORT TERM GOAL #3   Title report a 30% reduction in bil. shoulder pain with use with ADLs and self-care   Time 4   Period Weeks   Status Achieved           PT Long Term Goals - 10/25/15 1210      PT LONG TERM GOAL #1   Title be independent in advanced HEP   Time 8   Period Weeks   Status On-going     PT LONG TERM GOAL #2   Title reduce FOTO to < or = to 38% limitation   Time 8   Period Weeks   Status On-going     PT LONG TERM GOAL #3   Title demonstrate 4+/5 bilateral shoulder strength to improve endurance for use with self-care and ADLs   Time 8   Period Weeks    Status On-going     PT LONG TERM GOAL #4   Title report a 60% reduction in bil shoulder pain with use with ALDs and self-care   Time 8   Period  Weeks   Status On-going     PT LONG TERM GOAL #5   Title report no limitations with use of Lt UE with overhead activity   Time 8   Period Weeks   Status On-going               Plan - 10/30/15 1446    Clinical Impression Statement Patient has pain in left shoulder at end range of motion.  Upper thoracic does not move correctly with shoulder abduction and flexion.  Patient will contract the left upper trap at end range of shoulder abduction.  Patient has a trigger point in left infraspinatus. The left scapula does not move through full motion with shoulder flexion and abduction.  Patient will benefit with PT to improve ROM and strength.     Rehab Potential Good   Clinical Impairments Affecting Rehab Potential None   PT Frequency 2x / week   PT Duration 8 weeks   PT Treatment/Interventions ADLs/Self Care Home Management;Cryotherapy;Electrical Stimulation;Moist Heat;Therapeutic exercise;Therapeutic activities;Ultrasound;Neuromuscular re-education;Patient/family education;Manual techniques;Taping;Dry needling;Passive range of motion   PT Next Visit Plan Treat Lt shoulder with ionto ,e-stim/heat if helpful;  Bil shoulder strength, postural strength, shoulder flexibility   PT Home Exercise Plan progress as needed   Recommended Other Services None   Consulted and Agree with Plan of Care Patient      Patient will benefit from skilled therapeutic intervention in order to improve the following deficits and impairments:  Postural dysfunction, Decreased strength, Impaired flexibility, Pain, Decreased activity tolerance  Visit Diagnosis: Pain in left shoulder  Pain in right shoulder  Stiffness of left shoulder, not elsewhere classified  Muscle weakness (generalized)  Abnormal posture     Problem List Patient Active Problem List    Diagnosis Date Noted  . A-fib ([place]) 04/24/2015  . Atrial flutter with rapid ventricular response ([place])   . Atrial fibrillation with rapid ventricular response ([place])   . Elevated troponin   . NSTEMI (non-ST elevated myocardial infarction) ([place]) 10/29/2014  . Chest pain 10/28/2014  . Atrial flutter with RVR 10/28/2014  . Obesity-BMI 38 07/19/2014  . Chronic anticoagulation 07/19/2014  . CAD (coronary artery disease) 07/19/2014  . Chronic renal insufficiency, stage III (moderate) 07/19/2014  . Dyslipidemia 04/12/2014  . Pulmonary hypertension ([place]) 05/12/2013  . OSA (obstructive sleep apnea) 04/13/2013  . Apical variant hypertrophic cardiomyopathy ([place]) 03/05/2012  . Chronic diastolic heart failure ([place])   . Paroxysmal atrial fibrillation/flutter   . Sinus bradycardia   . COPD (chronic obstructive pulmonary disease) ([place]) 03/04/2012  . Hypothyroidism 03/04/2012  . Essential hypertension 03/04/2012  . DOE (dyspnea on exertion) 12/24/2010    Earlie Counts, PT 10/30/15 2:54 PM   [facility] [address] [place], [place], [address] Phone: [phone]   Fax:  [phone]  Name: LOSSIE KALP MRN: [id number] Date of Birth: [birth year]

## 2015-11-01 ENCOUNTER — Ambulatory Visit: Payer: Medicare Other

## 2015-11-01 DIAGNOSIS — R293 Abnormal posture: Secondary | ICD-10-CM

## 2015-11-01 DIAGNOSIS — M25612 Stiffness of left shoulder, not elsewhere classified: Secondary | ICD-10-CM | POA: Diagnosis not present

## 2015-11-01 DIAGNOSIS — M25512 Pain in left shoulder: Secondary | ICD-10-CM | POA: Diagnosis not present

## 2015-11-01 DIAGNOSIS — M6281 Muscle weakness (generalized): Secondary | ICD-10-CM

## 2015-11-01 DIAGNOSIS — M25511 Pain in right shoulder: Secondary | ICD-10-CM | POA: Diagnosis not present

## 2015-11-01 NOTE — Therapy (Signed)
[facility] [address] [place], [place], [address] Phone: [phone]   Fax:  [phone]  Physical Therapy Treatment  Patient Details  Name: Amy [name] MRN: [id number] Date of Birth: 07-22-1945 Referring Provider: Cari Caraway, MD  Encounter Date: 11/01/2015      PT End of Session - 11/01/15 1215    Visit Number 6   Number of Visits 10   Date for PT Re-Evaluation 12/11/15   Authorization Type medicare g-code on 10th visit   PT Start Time 1200  pt 15 minutes late   PT Stop Time 1227   PT Time Calculation (min) 27 min   Activity Tolerance Patient tolerated treatment well   Behavior During Therapy [facility] for tasks assessed/performed      Past Medical History:  Diagnosis Date  . Apical variant hypertrophic cardiomyopathy ([place])    Diagnosed by echo and ECG 11/13  . Atypical atrial flutter ([place])   . CAD (coronary artery disease)    a. LHC in 10/2011 demonstrated non-obstructive disease and an 80% lesion in a small D1 treated medically.   . Chronic diastolic heart failure ([place])   . CKD (chronic kidney disease), stage III   . COPD (chronic obstructive pulmonary disease) ([place])   . Dizziness    had PT for being off balance - in approx. 2012  . GERD (gastroesophageal reflux disease)   . Hyperlipidemia   . Hypertension   . Hypothyroid   . Lumbar disc disease   . Macular degeneration   . OSA (obstructive sleep apnea)   . Persistent atrial fibrillation ([place])       . Pulmonary hypertension ([place])    a. [place] 05/2013 showed mild PAH with normal PCWP and RA pressure, could be related to OSA and low oxygen saturation.   . Refusal of blood transfusions as patient is Jehovah's Witness   . Sinus bradycardia     Past Surgical History:  Procedure Laterality Date  . CARDIAC CATHETERIZATION     normal coronary arteries  . CARDIAC CATHETERIZATION N/A 10/31/2014   Procedure: Left Heart Cath and Coronary  Angiography;  Surgeon: Leonie Man, MD;  Location: [place] [facility];  Service: Cardiovascular;  Laterality: N/A;  . CATARACT EXTRACTION Bilateral   . CESAREAN SECTION  1983  . ELECTROPHYSIOLOGIC STUDY N/A 04/24/2015   Procedure: Atrial Fibrillation Ablation;  Surgeon: [name], MD;  Location: [place] [facility];  Service: Cardiovascular;  Laterality: N/A;  . RIGHT HEART CATHETERIZATION N/A 06/01/2013   Procedure: RIGHT HEART CATH;  Surgeon: Larey Dresser, MD;  [facility] CATH LAB;  Service: Cardiovascular;  Laterality: N/A;  . TEE WITHOUT CARDIOVERSION N/A 04/23/2015   Procedure: TRANSESOPHAGEAL ECHOCARDIOGRAM (TEE);  Surgeon: Josue Hector, MD;  Location: [facility] ENDOSCOPY;  Service: Cardiovascular;  Laterality: N/A;    There were no vitals filed for this visit.      Subjective Assessment - 11/01/15 1205    Subjective Pt was 15 minutes late.  Lt shoulder really feeling better.     Currently in Pain? No/denies                         [facility] Adult PT Treatment/Exercise - 11/01/15 0001      Shoulder Exercises: Seated   Flexion Strengthening;Both;20 reps;Weights   Flexion Weight (lbs) 1   Abduction Strengthening;Both;20 reps;Weights   ABduction Weight (lbs) 1   ABduction Limitations scaption 1# 2x10 bil.  Shoulder Exercises: Standing   Other Standing Exercises wall push up 10x     Shoulder Exercises: ROM/Strengthening   UBE (Upper Arm Bike) Level 1x 6 minutes (3/3)     Manual Therapy   Manual Therapy Joint mobilization;Soft tissue mobilization   Manual therapy comments PROM to left shoulder for flexion and abduction while guiding the scapula                  PT Short Term Goals - 10/30/15 1452      PT SHORT TERM GOAL #1   Title be independent in initial HEP   Time 4   Period Weeks   Status Achieved     PT SHORT TERM GOAL #2   Title report < or = to 3/10 Lt shoulder pain with reaching overhead and across the body   Time 4   Period  Weeks   Status Achieved     PT SHORT TERM GOAL #3   Title report a 30% reduction in bil. shoulder pain with use with ADLs and self-care   Time 4   Period Weeks   Status Achieved           PT Long Term Goals - 10/25/15 1210      PT LONG TERM GOAL #1   Title be independent in advanced HEP   Time 8   Period Weeks   Status On-going     PT LONG TERM GOAL #2   Title reduce FOTO to < or = to 38% limitation   Time 8   Period Weeks   Status On-going     PT LONG TERM GOAL #3   Title demonstrate 4+/5 bilateral shoulder strength to improve endurance for use with self-care and ADLs   Time 8   Period Weeks   Status On-going     PT LONG TERM GOAL #4   Title report a 60% reduction in bil shoulder pain with use with ALDs and self-care   Time 8   Period Weeks   Status On-going     PT LONG TERM GOAL #5   Title report no limitations with use of Lt UE with overhead activity   Time 8   Period Weeks   Status On-going               Plan - 11/01/15 1206    Clinical Impression Statement Pt reports that Lt shoulder feels 75% better since the start of care.  Pt with reduced scapular mobility and has increased muscular substitution with Lt UE AROM.  Pt will continue to benefit from skilled for Bil shoulder strength, AROM and pain management as needed.     Rehab Potential Good   PT Frequency 2x / week   PT Duration 8 weeks   PT Treatment/Interventions ADLs/Self Care Home Management;Cryotherapy;Electrical Stimulation;Moist Heat;Therapeutic exercise;Therapeutic activities;Ultrasound;Neuromuscular re-education;Patient/family education;Manual techniques;Taping;Dry needling;Passive range of motion   PT Next Visit Plan e-stim/heat if helpful;  Bil shoulder strength, postural strength, shoulder flexibility.  Exercise on the foam roll so pt can see if it would be beneficial at home.     Consulted and Agree with Plan of Care Patient      Patient will benefit from skilled therapeutic  intervention in order to improve the following deficits and impairments:  Postural dysfunction, Decreased strength, Impaired flexibility, Pain, Decreased activity tolerance  Visit Diagnosis: Pain in left shoulder  Pain in right shoulder  Stiffness of left shoulder, not elsewhere classified  Muscle weakness (generalized)  Abnormal posture  Problem List Patient Active Problem List   Diagnosis Date Noted  . A-fib ([place]) 04/24/2015  . Atrial flutter with rapid ventricular response ([place])   . Atrial fibrillation with rapid ventricular response ([place])   . Elevated troponin   . NSTEMI (non-ST elevated myocardial infarction) ([place]) 10/29/2014  . Chest pain 10/28/2014  . Atrial flutter with RVR 10/28/2014  . Obesity-BMI 38 07/19/2014  . Chronic anticoagulation 07/19/2014  . CAD (coronary artery disease) 07/19/2014  . Chronic renal insufficiency, stage III (moderate) 07/19/2014  . Dyslipidemia 04/12/2014  . Pulmonary hypertension ([place]) 05/12/2013  . OSA (obstructive sleep apnea) 04/13/2013  . Apical variant hypertrophic cardiomyopathy ([place]) 03/05/2012  . Chronic diastolic heart failure ([place])   . Paroxysmal atrial fibrillation/flutter   . Sinus bradycardia   . COPD (chronic obstructive pulmonary disease) ([place]) 03/04/2012  . Hypothyroidism 03/04/2012  . Essential hypertension 03/04/2012  . DOE (dyspnea on exertion) 12/24/2010     Sigurd Sos, PT 11/01/15 12:30 PM  [facility] [address] [place], [place], [address] Phone: [phone]   Fax:  [phone]  Name: LEILANI CESPEDES MRN: [id number] Date of Birth: 1945/12/28

## 2015-11-05 ENCOUNTER — Ambulatory Visit: Payer: Medicare Other | Admitting: Physical Therapy

## 2015-11-05 ENCOUNTER — Encounter: Payer: Self-pay | Admitting: Physical Therapy

## 2015-11-05 DIAGNOSIS — M25612 Stiffness of left shoulder, not elsewhere classified: Secondary | ICD-10-CM | POA: Diagnosis not present

## 2015-11-05 DIAGNOSIS — M25511 Pain in right shoulder: Secondary | ICD-10-CM | POA: Diagnosis not present

## 2015-11-05 DIAGNOSIS — M25512 Pain in left shoulder: Secondary | ICD-10-CM | POA: Diagnosis not present

## 2015-11-05 DIAGNOSIS — M6281 Muscle weakness (generalized): Secondary | ICD-10-CM

## 2015-11-05 DIAGNOSIS — R293 Abnormal posture: Secondary | ICD-10-CM | POA: Diagnosis not present

## 2015-11-05 NOTE — Therapy (Signed)
[facility] [address], [place], [place], [address] Phone: [phone]   Fax:  [phone]  Physical Therapy Treatment  Patient Details  Name: Amy [name] MRN: [id number] Date of Birth: November 15, 1945 Referring Provider: Cari Caraway, MD  Encounter Date: 11/05/2015      PT End of Session - 11/05/15 1445    Visit Number 7   Number of Visits 10   Date for PT Re-Evaluation 12/11/15   Authorization Type medicare g-code on 10th visit   PT Start Time 1402   PT Stop Time 1442   PT Time Calculation (min) 40 min   Activity Tolerance Patient tolerated treatment well   Behavior During Therapy [facility] for tasks assessed/performed      Past Medical History:  Diagnosis Date  . Apical variant hypertrophic cardiomyopathy ([place])    Diagnosed by echo and ECG 11/13  . Atypical atrial flutter ([place])   . CAD (coronary artery disease)    a. LHC in 10/2011 demonstrated non-obstructive disease and an 80% lesion in a small D1 treated medically.   . Chronic diastolic heart failure ([place])   . CKD (chronic kidney disease), stage III   . COPD (chronic obstructive pulmonary disease) ([place])   . Dizziness    had PT for being off balance - in approx. 2012  . GERD (gastroesophageal reflux disease)   . Hyperlipidemia   . Hypertension   . Hypothyroid   . Lumbar disc disease   . Macular degeneration   . OSA (obstructive sleep apnea)   . Persistent atrial fibrillation ([place])       . Pulmonary hypertension ([place])    a. [place] 05/2013 showed mild PAH with normal PCWP and RA pressure, could be related to OSA and low oxygen saturation.   . Refusal of blood transfusions as patient is Jehovah's Witness   . Sinus bradycardia     Past Surgical History:  Procedure Laterality Date  . CARDIAC CATHETERIZATION     normal coronary arteries  . CARDIAC CATHETERIZATION N/A 10/31/2014   Procedure: Left Heart Cath and Coronary Angiography;  Surgeon: Leonie Man, MD;  Location: [place] [facility];  Service: Cardiovascular;  Laterality: N/A;  . CATARACT EXTRACTION Bilateral   . CESAREAN SECTION  1983  . ELECTROPHYSIOLOGIC STUDY N/A 04/24/2015   Procedure: Atrial Fibrillation Ablation;  Surgeon: [name], MD;  Location: [place] [facility];  Service: Cardiovascular;  Laterality: N/A;  . RIGHT HEART CATHETERIZATION N/A 06/01/2013   Procedure: RIGHT HEART CATH;  Surgeon: Larey Dresser, MD;  Location: [facility] CATH LAB;  Service: Cardiovascular;  Laterality: N/A;  . TEE WITHOUT CARDIOVERSION N/A 04/23/2015   Procedure: TRANSESOPHAGEAL ECHOCARDIOGRAM (TEE);  Surgeon: Josue Hector, MD;  Location: [facility] ENDOSCOPY;  Service: Cardiovascular;  Laterality: N/A;    There were no vitals filed for this visit.      Subjective Assessment - 11/05/15 1412    Subjective I am not feeling great with my left shoulder today.  the increased pain started last Saturday.    Pertinent History Fall in a hotel shower 09/09/15 resulting in Rt and Lt shoulder pain   Diagnostic tests x-ray: at hospital after fall.  Negative per pt report.     Patient Stated Goals reduce Lt shoulder pain   Currently in Pain? Yes   Pain Score 5    Pain Location Shoulder   Pain Orientation Left   Pain Descriptors / Indicators --  pulling   Pain Type Acute  pain   Pain Onset 1 to 4 weeks ago   Pain Frequency Intermittent   Aggravating Factors  reaching up and back, pain with sleeping on left side   Pain Relieving Factors arm at side   Multiple Pain Sites No            [facility] PT Assessment - 11/05/15 0001      Strength   Strength Assessment Site Shoulder   Right/Left Shoulder Right;Left   Right Shoulder Flexion 4+/5   Right Shoulder ABduction 5/5   Right Shoulder Internal Rotation 5/5   Right Shoulder External Rotation 5/5   Left Shoulder Flexion 3/5   Left Shoulder ABduction 3+/5   Left Shoulder Internal Rotation 4+/5   Left Shoulder External Rotation 4-/5     Palpation    Palpation comment tenderness located on left RTC insertion                     [facility] Adult PT Treatment/Exercise - 11/05/15 0001      Shoulder Exercises: Seated   Flexion Strengthening;Both;20 reps;Weights  pain level 5/10   Flexion Weight (lbs) 1   Abduction Strengthening;Both;20 reps;Weights   ABduction Weight (lbs) 1   ABduction Limitations scaption 1# 2x10 bil.      Shoulder Exercises: ROM/Strengthening   UBE (Upper Arm Bike) Level 1x 6 minutes (3/3)     Modalities   Modalities Electrical Stimulation;Ultrasound     Acupuncturist Location left RTC insertion   Electrical Stimulation Action combo   Electrical Stimulation Parameters 8 min, to patient tolerancp   Electrical Stimulation Goals Pain     Ultrasound   Ultrasound Location left RTC insertion   Ultrasound Parameters 1 mhz, 1.2 w/cm2, 100%, 8 min  combo   Ultrasound Goals Pain     Manual Therapy   Manual Therapy Soft tissue mobilization   Soft tissue mobilization left RTC insertion, left posterior deltoid, left deltoid in sitting.                 PT Education - 11/05/15 1445    Education provided No          PT Short Term Goals - 10/30/15 1452      PT SHORT TERM GOAL #1   Title be independent in initial HEP   Time 4   Period Weeks   Status Achieved     PT SHORT TERM GOAL #2   Title report < or = to 3/10 Lt shoulder pain with reaching overhead and across the body   Time 4   Period Weeks   Status Achieved     PT SHORT TERM GOAL #3   Title report a 30% reduction in bil. shoulder pain with use with ADLs and self-care   Time 4   Period Weeks   Status Achieved           PT Long Term Goals - 11/05/15 1448      PT LONG TERM GOAL #1   Title be independent in advanced HEP   Period Weeks   Status On-going  still learning     PT LONG TERM GOAL #2   Title reduce FOTO to < or = to 38% limitation   Time 8   Period Weeks   Status On-going      PT LONG TERM GOAL #3   Title demonstrate 4+/5 bilateral shoulder strength to improve endurance for use with self-care and ADLs   Time 8  Period Weeks   Status On-going  weak     PT LONG TERM GOAL #4   Title report a 60% reduction in bil shoulder pain with use with ALDs and self-care   Time 8   Period Weeks   Status On-going     PT LONG TERM GOAL #5   Title report no limitations with use of Lt UE with overhead activity   Time 8   Period Weeks   Status On-going               Plan - 11/05/15 1446    Clinical Impression Statement Patient pain after therapy is 2/10 and improved left shoulder strength to 4-/5 compared to 3/5.  Patient has trigger points in leeft posterior deltoid.  Patient had a flare-up today therefore has not met goals.    Rehab Potential Good   Clinical Impairments Affecting Rehab Potential None   PT Frequency 2x / week   PT Duration 8 weeks   PT Treatment/Interventions ADLs/Self Care Home Management;Cryotherapy;Electrical Stimulation;Moist Heat;Therapeutic exercise;Therapeutic activities;Ultrasound;Neuromuscular re-education;Patient/family education;Manual techniques;Taping;Dry needling;Passive range of motion   PT Next Visit Plan see if electrical stimulation with ultrasound helped, return to prior exercises,    PT Home Exercise Plan progress as needed   Consulted and Agree with Plan of Care Patient      Patient will benefit from skilled therapeutic intervention in order to improve the following deficits and impairments:  Postural dysfunction, Decreased strength, Impaired flexibility, Pain, Decreased activity tolerance  Visit Diagnosis: Pain in left shoulder  Pain in right shoulder  Stiffness of left shoulder, not elsewhere classified  Muscle weakness (generalized)     Problem List Patient Active Problem List   Diagnosis Date Noted  . A-fib ([place]) 04/24/2015  . Atrial flutter with rapid ventricular response ([place])   . Atrial fibrillation  with rapid ventricular response ([place])   . Elevated troponin   . NSTEMI (non-ST elevated myocardial infarction) ([place]) 10/29/2014  . Chest pain 10/28/2014  . Atrial flutter with RVR 10/28/2014  . Obesity-BMI 38 07/19/2014  . Chronic anticoagulation 07/19/2014  . CAD (coronary artery disease) 07/19/2014  . Chronic renal insufficiency, stage III (moderate) 07/19/2014  . Dyslipidemia 04/12/2014  . Pulmonary hypertension ([place]) 05/12/2013  . OSA (obstructive sleep apnea) 04/13/2013  . Apical variant hypertrophic cardiomyopathy ([place]) 03/05/2012  . Chronic diastolic heart failure ([place])   . Paroxysmal atrial fibrillation/flutter   . Sinus bradycardia   . COPD (chronic obstructive pulmonary disease) ([place]) 03/04/2012  . Hypothyroidism 03/04/2012  . Essential hypertension 03/04/2012  . DOE (dyspnea on exertion) 12/24/2010    Earlie Counts, PT 11/05/15 2:50 PM   [facility] [address] [place], [place], [address] Phone: [phone]   Fax:  [phone]  Name: Amy [name] MRN: [id number] Date of Birth: 04-Sep-1945

## 2015-11-07 ENCOUNTER — Ambulatory Visit (INDEPENDENT_AMBULATORY_CARE_PROVIDER_SITE_OTHER): Payer: Medicare Other | Admitting: Internal Medicine

## 2015-11-07 VITALS — BP 114/63 | HR 63 | Ht 63.5 in | Wt 209.2 lb

## 2015-11-07 DIAGNOSIS — I422 Other hypertrophic cardiomyopathy: Secondary | ICD-10-CM

## 2015-11-07 DIAGNOSIS — I480 Paroxysmal atrial fibrillation: Secondary | ICD-10-CM

## 2015-11-07 DIAGNOSIS — I10 Essential (primary) hypertension: Secondary | ICD-10-CM

## 2015-11-07 NOTE — Patient Instructions (Signed)
Medication Instructions:  Your physician has recommended you make the following change in your medication:  1) Stop Multaq   Labwork: None ordered   Testing/Procedures: None ordered   Follow-Up: Your physician recommends that you schedule a follow-up appointment in: 3 months with Dr Caryl Comes and 6 months with Roderic Palau, NP   Any Other Special Instructions Will Be Listed Below (If Applicable).     If you need a refill on your cardiac medications before your next appointment, please call your pharmacy.

## 2015-11-07 NOTE — Progress Notes (Signed)
Electrophysiology Office Note  Date:  11/07/2015   ID:  Amy [name], DOB 1945/11/05, MRN [id number]  PCP:  Aretta Nip, MD  Cardiologist:  Dr Radford Pax Primary Electrophysiologist: Dr Caryl Comes   CC: Afib   History of Present Illness: Amy [name] is a 70 y.o. female who presents today for electrophysiology evaluation.   She has apical variant hypertrophic CM.  She also has severe atrial enlargement by MRI.  She recently underwent afib ablation by me earlier this year and has done suprisingly well.  She is unaware of any afib since her last visit with me. Today, she denies symptoms of chest pain, orthopnea, PND, lower extremity edema, claudication, dizziness, presyncope, syncope, bleeding, or neurologic sequela. The patient is tolerating medications without difficulties and is otherwise without complaint today.    Past Medical History:  Diagnosis Date  . Apical variant hypertrophic cardiomyopathy ([place])    Diagnosed by echo and ECG 11/13  . Atypical atrial flutter ([place])   . CAD (coronary artery disease)    a. LHC in 10/2011 demonstrated non-obstructive disease and an 80% lesion in a small D1 treated medically.   . Chronic diastolic heart failure ([place])   . CKD (chronic kidney disease), stage III   . COPD (chronic obstructive pulmonary disease) ([place])   . Dizziness    had PT for being off balance - in approx. 2012  . GERD (gastroesophageal reflux disease)   . Hyperlipidemia   . Hypertension   . Hypothyroid   . Lumbar disc disease   . Macular degeneration   . OSA (obstructive sleep apnea)   . Persistent atrial fibrillation ([place])       . Pulmonary hypertension ([place])    a. [place] 05/2013 showed mild PAH with normal PCWP and RA pressure, could be related to OSA and low oxygen saturation.   . Refusal of blood transfusions as patient is Jehovah's Witness   . Sinus bradycardia    Past Surgical History:  Procedure Laterality Date  . CARDIAC CATHETERIZATION     normal  coronary arteries  . CARDIAC CATHETERIZATION N/A 10/31/2014   Procedure: Left Heart Cath and Coronary Angiography;  Surgeon: Leonie Man, MD;  Location: [place] [facility];  Service: Cardiovascular;  Laterality: N/A;  . CATARACT EXTRACTION Bilateral   . CESAREAN SECTION  1983  . ELECTROPHYSIOLOGIC STUDY N/A 04/24/2015   Procedure: Atrial Fibrillation Ablation;  Surgeon: [name], MD;  Location: [place] [facility];  Service: Cardiovascular;  Laterality: N/A;  . RIGHT HEART CATHETERIZATION N/A 06/01/2013   Procedure: RIGHT HEART CATH;  Surgeon: Larey Dresser, MD;  [facility] CATH LAB;  Service: Cardiovascular;  Laterality: N/A;  . TEE WITHOUT CARDIOVERSION N/A 04/23/2015   Procedure: TRANSESOPHAGEAL ECHOCARDIOGRAM (TEE);  Surgeon: Josue Hector, MD;  Location: [facility] ENDOSCOPY;  Service: Cardiovascular;  Laterality: N/A;     Current Outpatient Prescriptions  Medication Sig Dispense Refill  . acetaminophen (TYLENOL) 500 MG tablet Take 500 mg by mouth daily as needed (pain). Counteract by Melaleuca    . albuterol (PROVENTIL HFA;VENTOLIN HFA) 108 (90 BASE) MCG/ACT inhaler Inhale 2 puffs into the lungs every 6 (six) hours as needed for wheezing or shortness of breath.     Marland Kitchen atorvastatin (LIPITOR) 80 MG tablet Take 1 tablet (80 mg total) by mouth daily with lunch. 90 tablet 3  . Cholecalciferol (VITAMIN D) 2000 UNITS CAPS Take 2,000 Units by mouth daily.     . colchicine 0.6 MG tablet Take 0.6 mg  by mouth every other day.     . ezetimibe (ZETIA) 10 MG tablet TAKE 1 TABLET (10 MG TOTAL) BY MOUTH DAILY. 90 tablet 3  . furosemide (LASIX) 20 MG tablet Take 1 tablet (20 mg total) by mouth 2 (two) times daily. 180 tablet 1  . KRILL OIL PO Take 1 capsule by mouth daily.    Marland Kitchen levothyroxine (SYNTHROID, LEVOTHROID) 100 MCG tablet Take 100 mcg by mouth daily before breakfast.    . metoprolol tartrate (LOPRESSOR) 25 MG tablet Take 0.5 tablets (12.5 mg total) by mouth 2 (two) times daily. 180 tablet 1  .  MULTAQ 400 MG tablet TAKE 1 TABLET BY MOUTH TWICE A DAY WITH A MEAL 60 tablet 1  . Multiple Vitamin (MULTIVITAMIN) capsule Take 1 capsule by mouth daily.    . nitroGLYCERIN (NITROSTAT) 0.4 MG SL tablet Place 1 tablet (0.4 mg total) under the tongue every 5 (five) minutes as needed for chest pain. 25 tablet 3  . pantoprazole (PROTONIX) 40 MG tablet TAKE 1 TABLET (40 MG TOTAL) BY MOUTH DAILY. 90 tablet 1  . rivaroxaban (XARELTO) 20 MG TABS tablet TAKE 1 TABLET BY MOUTH EVERY DAY 90 tablet 1  . zolpidem (AMBIEN) 5 MG tablet Take 2.5 mg by mouth at bedtime as needed for sleep.     No current facility-administered medications for this visit.     Allergies:   Amiodarone and Zyban [bupropion]   Social History:  The patient  reports that she quit smoking about 6 years ago. Her smoking use included Cigarettes. She has a 13.20 pack-year smoking history. She has never used smokeless tobacco. She reports that she drinks alcohol. She reports that she does not use drugs.   Family History:  The patient's  family history includes Breast cancer in her paternal aunt; Gout in her father; Heart disease in her sister; Lung cancer in her father.    ROS:  Please see the history of present illness.   All other systems are reviewed and negative.    PHYSICAL EXAM: VS:  BP 114/63 (BP Location: Left Arm, Patient Position: Sitting, Cuff Size: Normal)   Pulse 63   Ht 5' 3.5" (1.613 m)   Wt 209 lb 3.2 oz (94.9 kg)   BMI 36.48 kg/m  , BMI Body mass index is 36.48 kg/m. GEN: Well nourished, well developed, in no acute distress  HEENT: normal  Neck: no JVD, carotid bruits, or masses Cardiac: RRR; no murmurs, rubs, or gallops,no edema  Respiratory:  clear to auscultation bilaterally, normal work of breathing GI: soft, nontender, nondistended, + BS MS: no deformity or atrophy  Skin: warm and dry  Neuro:  Strength and sensation are intact Psych: euthymic mood, full affect  EKG:  EKG is ordered today. The ekg  ordered today shows sinus rhythm 63 bpm, LVH with repolarization abnormality   Recent Labs: 04/05/2015: ALT 27 07/18/2015: BUN 21; Creatinine, Ser 1.36; Hemoglobin 13.2; Platelets 218; Potassium 4.1; Sodium 142    Lipid Panel     Component Value Date/Time   CHOL 137 09/27/2014 0946   TRIG 76.0 09/27/2014 0946   HDL 49.50 09/27/2014 0946   CHOLHDL 3 09/27/2014 0946   VLDL 15.2 09/27/2014 0946   LDLCALC 72 09/27/2014 0946   LDLDIRECT 115.0 06/09/2014 1449     Wt Readings from Last 3 Encounters:  11/07/15 209 lb 3.2 oz (94.9 kg)  10/04/15 211 lb 12.8 oz (96.1 kg)  08/03/15 214 lb 6.4 oz (97.3 kg)  ASSESSMENT AND PLAN:  1.  Persistent aifb/ atypical atrial flutter Doing well s/p ablation Stop multaq Continue anticoagulation  2. overwight Weight loss advised Body mass index is 36.48 kg/m. She is not ready to make lifestyle change  3. HTN Stable No change required today  4. Hypertrophic CM Stable No change required today   Current medicines are reviewed at length with the patient today.   The patient does not have concerns regarding her medicines.  The following changes were made today:  none  Follow-up with Dr Caryl Comes in 3 months.  Will follow with Drs Earnestine Mealing, and AF clinic going forward.  I will see when needed   Signed, [name], MD  11/07/2015 3:39 PM     [address] [place]  [phone] (office) [phone] (fax)

## 2015-11-08 ENCOUNTER — Ambulatory Visit: Payer: Medicare Other | Attending: Family Medicine | Admitting: Physical Therapy

## 2015-11-08 ENCOUNTER — Encounter: Payer: Self-pay | Admitting: Physical Therapy

## 2015-11-08 DIAGNOSIS — M25512 Pain in left shoulder: Secondary | ICD-10-CM | POA: Diagnosis not present

## 2015-11-08 DIAGNOSIS — M25612 Stiffness of left shoulder, not elsewhere classified: Secondary | ICD-10-CM | POA: Insufficient documentation

## 2015-11-08 DIAGNOSIS — R293 Abnormal posture: Secondary | ICD-10-CM | POA: Diagnosis not present

## 2015-11-08 DIAGNOSIS — M6281 Muscle weakness (generalized): Secondary | ICD-10-CM | POA: Diagnosis not present

## 2015-11-08 DIAGNOSIS — M25511 Pain in right shoulder: Secondary | ICD-10-CM | POA: Diagnosis not present

## 2015-11-08 NOTE — Therapy (Signed)
[facility] [address] [place], [place], [address] Phone: [phone]   Fax:  [phone]  Physical Therapy Treatment  Patient Details  Name: Amy [name] MRN: [id number] Date of Birth: [birth year] Referring Provider: Cari Caraway, MD  Encounter Date: 11/08/2015      PT End of Session - 11/08/15 1441    Visit Number 8   Number of Visits 10   Date for PT Re-Evaluation 12/11/15   Authorization Type medicare g-code on 10th visit   PT Start Time 1400   PT Stop Time 1441   PT Time Calculation (min) 41 min   Activity Tolerance Patient tolerated treatment well   Behavior During Therapy [facility] for tasks assessed/performed      Past Medical History:  Diagnosis Date  . Apical variant hypertrophic cardiomyopathy ([place])    Diagnosed by echo and ECG 11/13  . Atypical atrial flutter ([place])   . CAD (coronary artery disease)    a. LHC in 10/2011 demonstrated non-obstructive disease and an 80% lesion in a small D1 treated medically.   . Chronic diastolic heart failure ([place])   . CKD (chronic kidney disease), stage III   . COPD (chronic obstructive pulmonary disease) ([place])   . Dizziness    had PT for being off balance - in approx. 2012  . GERD (gastroesophageal reflux disease)   . Hyperlipidemia   . Hypertension   . Hypothyroid   . Lumbar disc disease   . Macular degeneration   . OSA (obstructive sleep apnea)   . Persistent atrial fibrillation ([place])       . Pulmonary hypertension ([place])    a. [place] 05/2013 showed mild PAH with normal PCWP and RA pressure, could be related to OSA and low oxygen saturation.   . Refusal of blood transfusions as patient is Jehovah's Witness   . Sinus bradycardia     Past Surgical History:  Procedure Laterality Date  . CARDIAC CATHETERIZATION     normal coronary arteries  . CARDIAC CATHETERIZATION N/A 10/31/2014   Procedure: Left Heart Cath and Coronary Angiography;  Surgeon: Leonie Man, MD;  Location: [place] CV LAB;  Service: Cardiovascular;  Laterality: N/A;  . CATARACT EXTRACTION Bilateral   . CESAREAN SECTION  1983  . ELECTROPHYSIOLOGIC STUDY N/A 04/24/2015   Procedure: Atrial Fibrillation Ablation;  Surgeon: [name], MD;  Location: [place] [facility];  Service: Cardiovascular;  Laterality: N/A;  . RIGHT HEART CATHETERIZATION N/A 06/01/2013   Procedure: RIGHT HEART CATH;  Surgeon: Larey Dresser, MD;  Location: [facility] C[facility] LAB;  Service: Cardiovascular;  Laterality: N/A;  . TEE WITHOUT CARDIOVERSION N/A 04/23/2015   Procedure: TRANSESOPHAGEAL ECHOCARDIOGRAM (TEE);  Surgeon: Josue Hector, MD;  Location: [facility] ENDOSCOPY;  Service: Cardiovascular;  Laterality: N/A;    There were no vitals filed for this visit.      Subjective Assessment - 11/08/15 1409    Subjective My shoulder feels better. No pain in shoulder since late yesterday.    Pertinent History Fall in a hotel shower 09/09/15 resulting in Rt and Lt shoulder pain   Diagnostic tests x-ray: at hospital after fall.  Negative per pt report.     Patient Stated Goals reduce Lt shoulder pain   Currently in Pain? No/denies                         [facility] Adult PT Treatment/Exercise - 11/08/15 0001  Shoulder Exercises: Seated   External Rotation Left;10 reps;Weights;Strengthening  sititng with arm at 90 degrees abduction   External Rotation Weight (lbs) 1   Flexion 10 reps;Left;Strengthening  hold 10 sec at midrange; only go to 90 degree     Shoulder Exercises: Standing   Flexion Left;Strengthening;10 reps;Weights;Other (comment)  slide arm up wall; therpist assist scapula   ABduction 10 reps;Left;Strengthening;Weights  sliding up wall; 2 sets   Shoulder ABduction Weight (lbs) 1   Row Strengthening;Left;10 reps;Weights  3 sets; leaning over counter   Row Weight (lbs) 3   Other Standing Exercises wall push up 15x; arms at side   Other Standing Exercises standing facing wall  lifting arms off wall for bil. flexion 2x10     Shoulder Exercises: ROM/Strengthening   UBE (Upper Arm Bike) Level 1x 6 minutes (3/3)   Other ROM/Strengthening Exercises posterior left shoulder stretch hold 15 sec 3 times.                   PT Short Term Goals - 10/30/15 1452      PT SHORT TERM GOAL #1   Title be independent in initial HEP   Time 4   Period Weeks   Status Achieved     PT SHORT TERM GOAL #2   Title report < or = to 3/10 Lt shoulder pain with reaching overhead and across the body   Time 4   Period Weeks   Status Achieved     PT SHORT TERM GOAL #3   Title report a 30% reduction in bil. shoulder pain with use with ADLs and self-care   Time 4   Period Weeks   Status Achieved           PT Long Term Goals - 11/05/15 1448      PT LONG TERM GOAL #1   Title be independent in advanced HEP   Period Weeks   Status On-going  still learning     PT LONG TERM GOAL #2   Title reduce FOTO to < or = to 38% limitation   Time 8   Period Weeks   Status On-going     PT LONG TERM GOAL #3   Title demonstrate 4+/5 bilateral shoulder strength to improve endurance for use with self-care and ADLs   Time 8   Period Weeks   Status On-going  weak     PT LONG TERM GOAL #4   Title report a 60% reduction in bil shoulder pain with use with ALDs and self-care   Time 8   Period Weeks   Status On-going     PT LONG TERM GOAL #5   Title report no limitations with use of Lt UE with overhead activity   Time 8   Period Weeks   Status On-going               Plan - 11/08/15 1441    Clinical Impression Statement Patient came today with no pain and left with no pain in left shoulder. Patient needs guidance on scapula movement with left shoulder flexion and abduction.  Patient has difficulty with midrange abduction. Patient will benefit from skilled therapy to increased left shoulder strength and reduce pain.    Rehab Potential Good   Clinical Impairments  Affecting Rehab Potential None   PT Frequency 2x / week   PT Duration 8 weeks   PT Treatment/Interventions ADLs/Self Care Home Management;Cryotherapy;Electrical Stimulation;Moist Heat;Therapeutic exercise;Therapeutic activities;Ultrasound;Neuromuscular re-education;Patient/family education;Manual techniques;Taping;Dry needling;Passive range of motion  PT Next Visit Plan continue with strengthening the left shoulder for abduction, flexion, ER, midrange abduction and scapular, use modalities as needed, cert up on 06/13/4663   PT Home Exercise Plan progress as needed   Consulted and Agree with Plan of Care Patient      Patient will benefit from skilled therapeutic intervention in order to improve the following deficits and impairments:  Postural dysfunction, Decreased strength, Impaired flexibility, Pain, Decreased activity tolerance  Visit Diagnosis: Pain in left shoulder  Stiffness of left shoulder, not elsewhere classified  Muscle weakness (generalized)     Problem List Patient Active Problem List   Diagnosis Date Noted  . A-fib ([place]) 04/24/2015  . Atrial flutter with rapid ventricular response ([place])   . Atrial fibrillation with rapid ventricular response ([place])   . Elevated troponin   . NSTEMI (non-ST elevated myocardial infarction) ([place]) 10/29/2014  . Chest pain 10/28/2014  . Atrial flutter with RVR 10/28/2014  . Obesity-BMI 38 07/19/2014  . Chronic anticoagulation 07/19/2014  . CAD (coronary artery disease) 07/19/2014  . Chronic renal insufficiency, stage III (moderate) 07/19/2014  . Dyslipidemia 04/12/2014  . Pulmonary hypertension ([place]) 05/12/2013  . OSA (obstructive sleep apnea) 04/13/2013  . Apical variant hypertrophic cardiomyopathy ([place]) 03/05/2012  . Chronic diastolic heart failure ([place])   . Paroxysmal atrial fibrillation/flutter   . Sinus bradycardia   . COPD (chronic obstructive pulmonary disease) ([place]) 03/04/2012  . Hypothyroidism 03/04/2012  . Essential  hypertension 03/04/2012  . DOE (dyspnea on exertion) 12/24/2010    Earlie Counts, PT 11/08/15 2:45 PM    [facility] [address] [place], [place], [address] Phone: [phone]   Fax:  [phone]  Name: Amy [name] MRN: [id number] Date of Birth: 10/06/1945

## 2015-11-13 ENCOUNTER — Ambulatory Visit: Payer: Medicare Other | Admitting: Physical Therapy

## 2015-11-13 DIAGNOSIS — R293 Abnormal posture: Secondary | ICD-10-CM | POA: Diagnosis not present

## 2015-11-13 DIAGNOSIS — M6281 Muscle weakness (generalized): Secondary | ICD-10-CM | POA: Diagnosis not present

## 2015-11-13 DIAGNOSIS — M25612 Stiffness of left shoulder, not elsewhere classified: Secondary | ICD-10-CM

## 2015-11-13 DIAGNOSIS — M25512 Pain in left shoulder: Secondary | ICD-10-CM

## 2015-11-13 DIAGNOSIS — M25511 Pain in right shoulder: Secondary | ICD-10-CM | POA: Diagnosis not present

## 2015-11-13 NOTE — Therapy (Signed)
[facility] [address] [place], [place], [address] Phone: [phone]   Fax:  [phone]  Physical Therapy Treatment  Patient Details  Name: Amy [name] MRN: [id number] Date of Birth: [birth year] Referring Provider: Cari Caraway, MD  Encounter Date: 11/13/2015      PT End of Session - 11/13/15 1427    Visit Number 9   Number of Visits 10   Date for PT Re-Evaluation 12/11/15   Authorization Type medicare g-code on 10th visit   PT Start Time 1400   PT Stop Time 1438   PT Time Calculation (min) 38 min   Activity Tolerance Patient tolerated treatment well      Past Medical History:  Diagnosis Date  . Apical variant hypertrophic cardiomyopathy ([place])    Diagnosed by echo and ECG 11/13  . Atypical atrial flutter ([place])   . CAD (coronary artery disease)    a. LHC in 10/2011 demonstrated non-obstructive disease and an 80% lesion in a small D1 treated medically.   . Chronic diastolic heart failure ([place])   . CKD (chronic kidney disease), stage III   . COPD (chronic obstructive pulmonary disease) ([place])   . Dizziness    had PT for being off balance - in approx. 2012  . GERD (gastroesophageal reflux disease)   . Hyperlipidemia   . Hypertension   . Hypothyroid   . Lumbar disc disease   . Macular degeneration   . OSA (obstructive sleep apnea)   . Persistent atrial fibrillation ([place])       . Pulmonary hypertension ([place])    a. [place] 05/2013 showed mild PAH with normal PCWP and RA pressure, could be related to OSA and low oxygen saturation.   . Refusal of blood transfusions as patient is Jehovah's Witness   . Sinus bradycardia     Past Surgical History:  Procedure Laterality Date  . CARDIAC CATHETERIZATION     normal coronary arteries  . CARDIAC CATHETERIZATION N/A 10/31/2014   Procedure: Left Heart Cath and Coronary Angiography;  Surgeon: Leonie Man, MD;  Location: [place] [facility];  Service:  Cardiovascular;  Laterality: N/A;  . CATARACT EXTRACTION Bilateral   . CESAREAN SECTION  1983  . ELECTROPHYSIOLOGIC STUDY N/A 04/24/2015   Procedure: Atrial Fibrillation Ablation;  Surgeon: [name], MD;  Location: [place] [facility] LAB;  Service: Cardiovascular;  Laterality: N/A;  . RIGHT HEART CATHETERIZATION N/A 06/01/2013   Procedure: RIGHT HEART CATH;  Surgeon: Larey Dresser, MD;  Location: [facility] CATH LAB;  Service: Cardiovascular;  Laterality: N/A;  . TEE WITHOUT CARDIOVERSION N/A 04/23/2015   Procedure: TRANSESOPHAGEAL ECHOCARDIOGRAM (TEE);  Surgeon: Josue Hector, MD;  Location: [facility] ENDOSCOPY;  Service: Cardiovascular;  Laterality: N/A;    There were no vitals filed for this visit.      Subjective Assessment - 11/13/15 1402    Subjective My shoulder is feeling better but my left hand is painful, swollen  and stiff.  I take my gout medicine everyday.  I had a similar episode a couple of months ago and it got better in about a week.  Swelling noted left knuckle.     Currently in Pain? No/denies   Pain Score 0-No pain   Pain Location Shoulder   Pain Orientation Left   Pain Type Acute pain   Multiple Pain Sites Yes   Pain Score 5   Pain Location Hand   Pain Orientation Left  [place] Adult PT Treatment/Exercise - 11/13/15 0001      Shoulder Exercises: Supine   Protraction Strengthening;Right;Left;10 reps;Weights   Protraction Weight (lbs) 2   Other Supine Exercises small arc 12:00/6:00 and 9:00/3:00 10x each 2#  right and left     Shoulder Exercises: Seated   Row Strengthening;Left;10 reps  bent on counter   Row Weight (lbs) 3   Horizontal ABduction AROM;Left;20 reps  bent over next to counter   Horizontal ABduction Weight (lbs) 0   Flexion Strengthening;Left;20 reps;Weights   Flexion Weight (lbs) 1   Abduction Strengthening;Both;20 reps;Weights   ABduction Weight (lbs) 1   ABduction Limitations scaption 1# 2x10 bil.      Shoulder  Exercises: Sidelying   External Rotation Strengthening;Right;Left;20 reps   External Rotation Weight (lbs) 2     Shoulder Exercises: Standing   Flexion Strengthening;Left;20 reps;Weights   Shoulder Flexion Weight (lbs) 1   Extension Strengthening;Left;20 reps;Weights   Extension Weight (lbs) 3   Row Strengthening;Left;10 reps;Weights  3 sets; leaning over counter   Row Weight (lbs) 3   Other Standing Exercises wall push up 15x; arms at side   Other Standing Exercises ball roll on wall for shoulder flexion 12x     Shoulder Exercises: ROM/Strengthening   UBE (Upper Arm Bike) Level 1x 6 minutes (3/3)                  PT Short Term Goals - 11/13/15 1432      PT SHORT TERM GOAL #1   Title be independent in initial HEP   Status Achieved     PT SHORT TERM GOAL #2   Title report < or = to 3/10 Lt shoulder pain with reaching overhead and across the body   Status Achieved     PT SHORT TERM GOAL #3   Title report a 30% reduction in bil. shoulder pain with use with ADLs and self-care   Status Achieved           PT Long Term Goals - 11/13/15 1432      PT LONG TERM GOAL #1   Title be independent in advanced HEP   Time 8   Period Weeks   Status On-going     PT LONG TERM GOAL #2   Title reduce FOTO to < or = to 38% limitation   Time 8   Period Weeks   Status On-going     PT LONG TERM GOAL #3   Title demonstrate 4+/5 bilateral shoulder strength to improve endurance for use with self-care and ADLs   Time 8   Period Weeks   Status On-going     PT LONG TERM GOAL #4   Title report a 60% reduction in bil shoulder pain with use with ALDs and self-care   Time 8   Period Weeks   Status On-going     PT LONG TERM GOAL #5   Title report no limitations with use of Lt UE with overhead activity   Time 8   Period Weeks   Status On-going               Plan - 11/13/15 1430    Clinical Impression Statement Patient is progressing well with shoulder rehab.   Modified strengthening with weights to an arm cuff weight rather than resistive bands secondary to hand inflammation.  Therapist also closely monitoring for pain.  On track to meet remaining goals in 1-2 weeks.     PT Next Visit Plan  continue with strengthening the left shoulder for abduction, flexion, ER, midrange abduction and scapular, use modalities as needed      Patient will benefit from skilled therapeutic intervention in order to improve the following deficits and impairments:     Visit Diagnosis: Pain in left shoulder  Stiffness of left shoulder, not elsewhere classified  Muscle weakness (generalized)  Pain in right shoulder     Problem List Patient Active Problem List   Diagnosis Date Noted  . A-fib ([place]) 04/24/2015  . Atrial flutter with rapid ventricular response ([place])   . Atrial fibrillation with rapid ventricular response ([place])   . Elevated troponin   . NSTEMI (non-ST elevated myocardial infarction) ([place]) 10/29/2014  . Chest pain 10/28/2014  . Atrial flutter with RVR 10/28/2014  . Obesity-BMI 38 07/19/2014  . Chronic anticoagulation 07/19/2014  . CAD (coronary artery disease) 07/19/2014  . Chronic renal insufficiency, stage III (moderate) 07/19/2014  . Dyslipidemia 04/12/2014  . Pulmonary hypertension ([place]) 05/12/2013  . OSA (obstructive sleep apnea) 04/13/2013  . Apical variant hypertrophic cardiomyopathy ([place]) 03/05/2012  . Chronic diastolic heart failure ([place])   . Paroxysmal atrial fibrillation/flutter   . Sinus bradycardia   . COPD (chronic obstructive pulmonary disease) ([place]) 03/04/2012  . Hypothyroidism 03/04/2012  . Essential hypertension 03/04/2012  . DOE (dyspnea on exertion) 12/24/2010    Alvera Singh 11/13/2015, 2:40 PM  [facility] [address] [place], [place], [address] Phone: [phone]   Fax:  [phone]  Name: Amy [name] MRN: [id number] Date of Birth:  Dec 13, 1945

## 2015-11-15 ENCOUNTER — Encounter: Payer: Self-pay | Admitting: Physical Therapy

## 2015-11-15 ENCOUNTER — Ambulatory Visit: Payer: Medicare Other | Admitting: Physical Therapy

## 2015-11-15 DIAGNOSIS — R293 Abnormal posture: Secondary | ICD-10-CM | POA: Diagnosis not present

## 2015-11-15 DIAGNOSIS — M25612 Stiffness of left shoulder, not elsewhere classified: Secondary | ICD-10-CM

## 2015-11-15 DIAGNOSIS — M25511 Pain in right shoulder: Secondary | ICD-10-CM | POA: Diagnosis not present

## 2015-11-15 DIAGNOSIS — M25512 Pain in left shoulder: Secondary | ICD-10-CM | POA: Diagnosis not present

## 2015-11-15 DIAGNOSIS — M6281 Muscle weakness (generalized): Secondary | ICD-10-CM

## 2015-11-15 NOTE — Therapy (Signed)
[facility] [address] [place], [place], [address] Phone: [phone]   Fax:  [phone]  Physical Therapy Treatment  Patient Details  Name: Amy [name] MRN: [id number] Date of Birth: 1945-06-23 Referring Provider: Cari Caraway, MD  Encounter Date: 11/15/2015      PT End of Session - 11/15/15 1402    Visit Number 10   Number of Visits 20   Date for PT Re-Evaluation 12/11/15   Authorization Type medicare g-code on 10th visit   PT Start Time 1400   PT Stop Time 1440   PT Time Calculation (min) 40 min   Activity Tolerance Patient tolerated treatment well   Behavior During Therapy [facility] for tasks assessed/performed      Past Medical History:  Diagnosis Date  . Apical variant hypertrophic cardiomyopathy ([place])    Diagnosed by echo and ECG 11/13  . Atypical atrial flutter ([place])   . CAD (coronary artery disease)    a. LHC in 10/2011 demonstrated non-obstructive disease and an 80% lesion in a small D1 treated medically.   . Chronic diastolic heart failure ([place])   . CKD (chronic kidney disease), stage III   . COPD (chronic obstructive pulmonary disease) ([place])   . Dizziness    had PT for being off balance - in approx. 2012  . GERD (gastroesophageal reflux disease)   . Hyperlipidemia   . Hypertension   . Hypothyroid   . Lumbar disc disease   . Macular degeneration   . OSA (obstructive sleep apnea)   . Persistent atrial fibrillation ([place])       . Pulmonary hypertension ([place])    a. [place] 05/2013 showed mild PAH with normal PCWP and RA pressure, could be related to OSA and low oxygen saturation.   . Refusal of blood transfusions as patient is Jehovah's Witness   . Sinus bradycardia     Past Surgical History:  Procedure Laterality Date  . CARDIAC CATHETERIZATION     normal coronary arteries  . CARDIAC CATHETERIZATION N/A 10/31/2014   Procedure: Left Heart Cath and Coronary Angiography;  Surgeon: Leonie Man, MD;  Location: [place] [facility];  Service: Cardiovascular;  Laterality: N/A;  . CATARACT EXTRACTION Bilateral   . CESAREAN SECTION  1983  . ELECTROPHYSIOLOGIC STUDY N/A 04/24/2015   Procedure: Atrial Fibrillation Ablation;  Surgeon: [name], MD;  Location: [place] [facility];  Service: Cardiovascular;  Laterality: N/A;  . RIGHT HEART CATHETERIZATION N/A 06/01/2013   Procedure: RIGHT HEART CATH;  Surgeon: Larey Dresser, MD;  Location: [facility] C[facility] LAB;  Service: Cardiovascular;  Laterality: N/A;  . TEE WITHOUT CARDIOVERSION N/A 04/23/2015   Procedure: TRANSESOPHAGEAL ECHOCARDIOGRAM (TEE);  Surgeon: Josue Hector, MD;  Location: [facility] ENDOSCOPY;  Service: Cardiovascular;  Laterality: N/A;    There were no vitals filed for this visit.      Subjective Assessment - 11/15/15 1409    Subjective My left hand is better.    Pertinent History Fall in a hotel shower 09/09/15 resulting in Rt and Lt shoulder pain   Diagnostic tests x-ray: at hospital after fall.  Negative per pt report.     Patient Stated Goals reduce Lt shoulder pain   Currently in Pain? No/denies            [facility] PT Assessment - 11/15/15 0001      Assessment   Medical Diagnosis Rt shoulder bursitis   Onset Date/Surgical Date 09/09/15  Lt shoulder began 09/28/15  Hand Dominance Right   Next MD Visit none     Precautions   Precautions None     Restrictions   Weight Bearing Restrictions No     Home Environment   Living Environment Private residence   Living Arrangements Other relatives     Prior Function   Level of Independence Independent   Vocation Retired   Leisure none     Cognition   Overall Cognitive Status Within Functional Limits for tasks assessed     Observation/Other Assessments   Focus on Therapeutic Outcomes (FOTO)  41% limitation     Strength   Right Shoulder Flexion 5/5   Left Shoulder Flexion 3+/5   Left Shoulder ABduction 4/5   Left Shoulder Internal Rotation 5/5   Left Shoulder  External Rotation 4+/5                     [facility] Adult PT Treatment/Exercise - 11/15/15 0001      Shoulder Exercises: Supine   Protraction Strengthening;Right;Left;10 reps;Weights  2 sets   Protraction Weight (lbs) 2     Shoulder Exercises: Seated   Flexion Strengthening;Left;20 reps;Weights   Flexion Weight (lbs) 1   Abduction Strengthening;Both;20 reps;Weights   ABduction Weight (lbs) 1   ABduction Limitations scaption 1# 2x10 bil.      Shoulder Exercises: Sidelying   External Rotation Strengthening;Right;Left;20 reps   External Rotation Weight (lbs) 1# on left and 2# on right     Shoulder Exercises: Standing   Other Standing Exercises wall push up 15x; arms at side   Other Standing Exercises ball roll on wall for shoulder flexion 12x     Shoulder Exercises: ROM/Strengthening   UBE (Upper Arm Bike) Level 1x 6 minutes (3/3)     Shoulder Exercises: Power [employer] 20 reps  20#                PT Education - 11/15/15 1422    Education provided No          PT Short Term Goals - 11/13/15 1432      PT SHORT TERM GOAL #1   Title be independent in initial HEP   Status Achieved     PT SHORT TERM GOAL #2   Title report < or = to 3/10 Lt shoulder pain with reaching overhead and across the body   Status Achieved     PT SHORT TERM GOAL #3   Title report a 30% reduction in bil. shoulder pain with use with ADLs and self-care   Status Achieved           PT Long Term Goals - 11/15/15 1417      PT LONG TERM GOAL #1   Title be independent in advanced HEP   Time 8   Period Weeks   Status On-going     PT LONG TERM GOAL #2   Title reduce FOTO to < or = to 38% limitation   Time 8   Period Weeks   Status On-going  41% limitation     PT LONG TERM GOAL #3   Title demonstrate 4+/5 bilateral shoulder strength to improve endurance for use with self-care and ADLs   Time 8   Period Weeks   Status On-going     PT LONG TERM GOAL #4   Title  report a 60% reduction in bil shoulder pain with use with ALDs and self-care   Time 8   Period Weeks   Status  Achieved     PT LONG TERM GOAL #5   Title report no limitations with use of Lt UE with overhead activity   Time 8   Period Weeks   Status On-going               Plan - 11-18-2015 1422    Clinical Impression Statement Patient has increased strength of bil. shoulders.  Patient is able to use her left arm through the full motion with weight.  Patient reports no pain in left arm.  Patient needs verbal cues to relax the upper traps with exercise.  Patient will benefit from physical therapy to increase shoulder strength and function.    Rehab Potential Good   Clinical Impairments Affecting Rehab Potential None   PT Frequency 2x / week   PT Duration 8 weeks   PT Treatment/Interventions ADLs/Self Care Home Management;Cryotherapy;Electrical Stimulation;Moist Heat;Therapeutic exercise;Therapeutic activities;Ultrasound;Neuromuscular re-education;Patient/family education;Manual techniques;Taping;Dry needling;Passive range of motion   PT Next Visit Plan continue with strengthening the left shoulder for abduction, flexion, ER, midrange abduction and scapular, use modalities as needed   PT Home Exercise Plan progress as needed   Consulted and Agree with Plan of Care Patient      Patient will benefit from skilled therapeutic intervention in order to improve the following deficits and impairments:  Postural dysfunction, Decreased strength, Impaired flexibility, Pain, Decreased activity tolerance  Visit Diagnosis: Pain in left shoulder  Stiffness of left shoulder, not elsewhere classified  Muscle weakness (generalized)  Pain in right shoulder       G-Codes - 11-18-2015 1408    Functional Assessment Tool Used FOTO: 41% limitation   Functional Limitation Other PT primary   Other PT Primary Current Status (K0677) At least 20 percent but less than 40 percent impaired, limited or  restricted   Other PT Primary Goal Status (C3403) At least 20 percent but less than 40 percent impaired, limited or restricted      Problem List Patient Active Problem List   Diagnosis Date Noted  . A-fib ([place]) 04/24/2015  . Atrial flutter with rapid ventricular response ([place])   . Atrial fibrillation with rapid ventricular response ([place])   . Elevated troponin   . NSTEMI (non-ST elevated myocardial infarction) ([place]) 10/29/2014  . Chest pain 10/28/2014  . Atrial flutter with RVR 10/28/2014  . Obesity-BMI 38 07/19/2014  . Chronic anticoagulation 07/19/2014  . CAD (coronary artery disease) 07/19/2014  . Chronic renal insufficiency, stage III (moderate) 07/19/2014  . Dyslipidemia 04/12/2014  . Pulmonary hypertension ([place]) 05/12/2013  . OSA (obstructive sleep apnea) 04/13/2013  . Apical variant hypertrophic cardiomyopathy ([place]) 03/05/2012  . Chronic diastolic heart failure ([place])   . Paroxysmal atrial fibrillation/flutter   . Sinus bradycardia   . COPD (chronic obstructive pulmonary disease) ([place]) 03/04/2012  . Hypothyroidism 03/04/2012  . Essential hypertension 03/04/2012  . DOE (dyspnea on exertion) 12/24/2010    Earlie Counts, PT 11/18/2015 2:42 PM   [facility] [address], [place], [place], [address] Phone: [phone]   Fax:  [phone]  Name: Amy [name] MRN: [id number] Date of Birth: [birth year]

## 2015-11-18 DIAGNOSIS — R000 Tachycardia, unspecified: Secondary | ICD-10-CM | POA: Diagnosis not present

## 2015-11-19 ENCOUNTER — Emergency Department (HOSPITAL_COMMUNITY)
Admission: EM | Admit: 2015-11-19 | Discharge: 2015-11-19 | Disposition: A | Discharge status: Home / Self Care | Payer: Medicare Other | Attending: Emergency Medicine | Admitting: Emergency Medicine

## 2015-11-19 ENCOUNTER — Encounter: Payer: Medicare Other | Admitting: Physical Therapy

## 2015-11-19 ENCOUNTER — Telehealth (HOSPITAL_COMMUNITY): Payer: Self-pay | Admitting: *Deleted

## 2015-11-19 ENCOUNTER — Emergency Department (HOSPITAL_COMMUNITY): Payer: Medicare Other

## 2015-11-19 ENCOUNTER — Encounter (HOSPITAL_COMMUNITY): Payer: Self-pay | Admitting: *Deleted

## 2015-11-19 DIAGNOSIS — I480 Paroxysmal atrial fibrillation: Secondary | ICD-10-CM | POA: Diagnosis not present

## 2015-11-19 DIAGNOSIS — I252 Old myocardial infarction: Secondary | ICD-10-CM | POA: Diagnosis not present

## 2015-11-19 DIAGNOSIS — I5032 Chronic diastolic (congestive) heart failure: Secondary | ICD-10-CM | POA: Diagnosis not present

## 2015-11-19 DIAGNOSIS — I4891 Unspecified atrial fibrillation: Secondary | ICD-10-CM | POA: Diagnosis not present

## 2015-11-19 DIAGNOSIS — E039 Hypothyroidism, unspecified: Secondary | ICD-10-CM | POA: Insufficient documentation

## 2015-11-19 DIAGNOSIS — I2510 Atherosclerotic heart disease of native coronary artery without angina pectoris: Secondary | ICD-10-CM | POA: Insufficient documentation

## 2015-11-19 DIAGNOSIS — Z87891 Personal history of nicotine dependence: Secondary | ICD-10-CM | POA: Diagnosis not present

## 2015-11-19 DIAGNOSIS — I130 Hypertensive heart and chronic kidney disease with heart failure and stage 1 through stage 4 chronic kidney disease, or unspecified chronic kidney disease: Secondary | ICD-10-CM | POA: Diagnosis not present

## 2015-11-19 DIAGNOSIS — J449 Chronic obstructive pulmonary disease, unspecified: Secondary | ICD-10-CM | POA: Diagnosis not present

## 2015-11-19 DIAGNOSIS — Z7901 Long term (current) use of anticoagulants: Secondary | ICD-10-CM | POA: Diagnosis not present

## 2015-11-19 DIAGNOSIS — R002 Palpitations: Secondary | ICD-10-CM | POA: Diagnosis present

## 2015-11-19 DIAGNOSIS — R000 Tachycardia, unspecified: Secondary | ICD-10-CM | POA: Diagnosis not present

## 2015-11-19 DIAGNOSIS — N183 Chronic kidney disease, stage 3 (moderate): Secondary | ICD-10-CM | POA: Insufficient documentation

## 2015-11-19 LAB — CBC WITH DIFFERENTIAL/PLATELET
BASOS ABS: 0 10*3/uL (ref 0.0–0.1)
Basophils Relative: 0 %
Eosinophils Absolute: 0.2 10*3/uL (ref 0.0–0.7)
Eosinophils Relative: 2 %
HEMATOCRIT: 42.2 % (ref 36.0–46.0)
HEMOGLOBIN: 13.5 g/dL (ref 12.0–15.0)
LYMPHS PCT: 31 %
Lymphs Abs: 2.5 10*3/uL (ref 0.7–4.0)
MCH: 29.2 pg (ref 26.0–34.0)
MCHC: 32 g/dL (ref 30.0–36.0)
MCV: 91.1 fL (ref 78.0–100.0)
MONO ABS: 0.6 10*3/uL (ref 0.1–1.0)
MONOS PCT: 7 %
NEUTROS ABS: 4.8 10*3/uL (ref 1.7–7.7)
NEUTROS PCT: 60 %
Platelets: 345 10*3/uL (ref 150–400)
RBC: 4.63 MIL/uL (ref 3.87–5.11)
RDW: 16.3 % — AB (ref 11.5–15.5)
WBC: 8.1 10*3/uL (ref 4.0–10.5)

## 2015-11-19 LAB — COMPREHENSIVE METABOLIC PANEL
ALBUMIN: 3.6 g/dL (ref 3.5–5.0)
ALK PHOS: 112 U/L (ref 38–126)
ALT: 28 U/L (ref 14–54)
AST: 22 U/L (ref 15–41)
Anion gap: 7 (ref 5–15)
BILIRUBIN TOTAL: 0.5 mg/dL (ref 0.3–1.2)
BUN: 15 mg/dL (ref 6–20)
CALCIUM: 8.6 mg/dL — AB (ref 8.9–10.3)
CO2: 20 mmol/L — ABNORMAL LOW (ref 22–32)
CREATININE: 1.08 mg/dL — AB (ref 0.44–1.00)
Chloride: 110 mmol/L (ref 101–111)
GFR calc Af Amer: 59 mL/min — ABNORMAL LOW (ref >60)
GFR, EST NON AFRICAN AMERICAN: 51 mL/min — AB (ref >60)
GLUCOSE: 104 mg/dL — AB (ref 65–99)
POTASSIUM: 3.3 mmol/L — AB (ref 3.5–5.1)
Sodium: 137 mmol/L (ref 135–145)
TOTAL PROTEIN: 7.3 g/dL (ref 6.5–8.1)

## 2015-11-19 LAB — BRAIN NATRIURETIC PEPTIDE: B Natriuretic Peptide: 217 pg/mL — ABNORMAL HIGH (ref 0.0–100.0)

## 2015-11-19 LAB — I-STAT TROPONIN, ED: TROPONIN I, POC: 0.06 ng/mL (ref 0.00–0.08)

## 2015-11-19 MED ORDER — ETOMIDATE 2 MG/ML IV SOLN
20.0000 mg | Freq: Once | INTRAVENOUS | Status: DC
  Filled 2015-11-19: qty 10

## 2015-11-19 MED ORDER — ETOMIDATE 2 MG/ML IV SOLN
INTRAVENOUS | Status: AC | PRN
  Administered 2015-11-19: 10 mg via INTRAVENOUS
  Administered 2015-11-19: 5 mg via INTRAVENOUS

## 2015-11-19 NOTE — Sedation Documentation (Signed)
Pt shocked at 150 joules, converted to NSR

## 2015-11-19 NOTE — Sedation Documentation (Signed)
Family updated as to patient's status. Family at bedside.

## 2015-11-19 NOTE — Sedation Documentation (Signed)
Patient is resting comfortably.

## 2015-11-19 NOTE — ED Provider Notes (Signed)
[facility] DEPT Provider Note   CSN: [id number] Arrival date & time:     First Provider Contact:  First MD Initiated Contact with Patient 11/19/15 0008     By signing my name below, I, Amy [name], attest that this documentation has been prepared under the direction and in the presence of physician practitioner, Amy Freeze, MD. Electronically Signed: Dora [name], Scribe. 11/19/2015. 12:08 AM.  History   Chief Complaint No chief complaint on file.   The history is provided by the patient and the EMS personnel. No language interpreter was used.     HPI Comments: Amy [name] is a 69 y.o. female brought in by EMS, with PMHx of CAD, CHF, and A-Fib, who presents to the Emergency Department complaining of sudden onset, constant, severe, palpitations beginning a couple of hours ago. She states she experienced palpitations 2 days ago as well and took Lopressor with relief. She states she tried [employer] and vomited after taking it. Pt uses Xarelto daily and states she never misses her doses. Per EMS, pt was administered aspirin en route to the ER tonight. Pt reports 4/10 chest pain and pressure currently. She states she was diagnosed with A-Fib in 2013 shortly after being diagnosed with CHF. She reports an ablation in January of this year. She notes this is her second episode of A-Fib since her ablation. She states she had an appointment with Dr. Rayann Heman last week and her results were good. She notes a h/o of bilateral heart catheterization. Pt notes she was taken off of her Multaq last week. She denies fever or any other associated symptoms.  Past Medical History:  Diagnosis Date  . Apical variant hypertrophic cardiomyopathy ([place])    Diagnosed by echo and ECG 11/13  . Atypical atrial flutter ([place])   . CAD (coronary artery disease)    a. LHC in 10/2011 demonstrated non-obstructive disease and an 80% lesion in a small D1 treated medically.   . Chronic diastolic heart  failure ([place])   . CKD (chronic kidney disease), stage III   . COPD (chronic obstructive pulmonary disease) ([place])   . Dizziness    had PT for being off balance - in approx. 2012  . GERD (gastroesophageal reflux disease)   . Hyperlipidemia   . Hypertension   . Hypothyroid   . Lumbar disc disease   . Macular degeneration   . OSA (obstructive sleep apnea)   . Persistent atrial fibrillation ([place])       . Pulmonary hypertension ([place])    a. [place] 05/2013 showed mild PAH with normal PCWP and RA pressure, could be related to OSA and low oxygen saturation.   . Refusal of blood transfusions as patient is Jehovah's Witness   . Sinus bradycardia     Patient Active Problem List   Diagnosis Date Noted  . A-fib ([place]) 04/24/2015  . Atrial flutter with rapid ventricular response ([place])   . Atrial fibrillation with rapid ventricular response ([place])   . Elevated troponin   . NSTEMI (non-ST elevated myocardial infarction) ([place]) 10/29/2014  . Chest pain 10/28/2014  . Atrial flutter with RVR 10/28/2014  . Obesity-BMI 38 07/19/2014  . Chronic anticoagulation 07/19/2014  . CAD (coronary artery disease) 07/19/2014  . Chronic renal insufficiency, stage III (moderate) 07/19/2014  . Dyslipidemia 04/12/2014  . Pulmonary hypertension ([place]) 05/12/2013  . OSA (obstructive sleep apnea) 04/13/2013  . Apical variant hypertrophic cardiomyopathy ([place]) 03/05/2012  . Chronic diastolic heart failure ([place])   . Paroxysmal atrial fibrillation/flutter   .  Sinus bradycardia   . COPD (chronic obstructive pulmonary disease) ([place]) 03/04/2012  . Hypothyroidism 03/04/2012  . Essential hypertension 03/04/2012  . DOE (dyspnea on exertion) 12/24/2010    Past Surgical History:  Procedure Laterality Date  . CARDIAC CATHETERIZATION     normal coronary arteries  . CARDIAC CATHETERIZATION N/A 10/31/2014   Procedure: Left Heart Cath and Coronary Angiography;  Surgeon: Leonie Man, MD;  Location: [facility] LAB;  Service:  Cardiovascular;  Laterality: N/A;  . CATARACT EXTRACTION Bilateral   . CESAREAN SECTION  1983  . ELECTROPHYSIOLOGIC STUDY N/A 04/24/2015   Procedure: Atrial Fibrillation Ablation;  Surgeon: [name], MD;  Location: [place] CV LAB;  Service: Cardiovascular;  Laterality: N/A;  . RIGHT HEART CATHETERIZATION N/A 06/01/2013   Procedure: RIGHT HEART CATH;  Surgeon: Larey Dresser, MD;  Location: [facility] CATH LAB;  Service: Cardiovascular;  Laterality: N/A;  . TEE WITHOUT CARDIOVERSION N/A 04/23/2015   Procedure: TRANSESOPHAGEAL ECHOCARDIOGRAM (TEE);  Surgeon: Josue Hector, MD;  Location: [facility] ENDOSCOPY;  Service: Cardiovascular;  Laterality: N/A;    OB History    No data available       Home Medications    Prior to Admission medications   Medication Sig Start Date End Date Taking? Authorizing Provider  acetaminophen (TYLENOL) 500 MG tablet Take 500 mg by mouth daily as needed (pain). Counteract by [place]    Historical Provider, MD  albuterol (PROVENTIL HFA;VENTOLIN HFA) 108 (90 BASE) MCG/ACT inhaler Inhale 2 puffs into the lungs every 6 (six) hours as needed for wheezing or shortness of breath.     Historical Provider, MD  atorvastatin (LIPITOR) 80 MG tablet Take 1 tablet (80 mg total) by mouth daily with lunch. 02/08/15   Sherran Needs, NP  Cholecalciferol (VITAMIN D) 2000 UNITS CAPS Take 2,000 Units by mouth daily.     Historical Provider, MD  colchicine 0.6 MG tablet Take 0.6 mg by mouth every other day.     Historical Provider, MD  ezetimibe (ZETIA) 10 MG tablet TAKE 1 TABLET (10 MG TOTAL) BY MOUTH DAILY. 02/09/15   Sueanne Margarita, MD  furosemide (LASIX) 20 MG tablet Take 1 tablet (20 mg total) by mouth 2 (two) times daily. 10/04/15   Eileen Stanford, PA-C  KRILL OIL PO Take 1 capsule by mouth daily.    Historical Provider, MD  levothyroxine (SYNTHROID, LEVOTHROID) 100 MCG tablet Take 100 mcg by mouth daily before breakfast.    Historical Provider, MD  metoprolol tartrate  (LOPRESSOR) 25 MG tablet Take 0.5 tablets (12.5 mg total) by mouth 2 (two) times daily. 10/04/15   Eileen Stanford, PA-C  Multiple Vitamin (MULTIVITAMIN) capsule Take 1 capsule by mouth daily.    Historical Provider, MD  nitroGLYCERIN (NITROSTAT) 0.4 MG SL tablet Place 1 tablet (0.4 mg total) under the tongue every 5 (five) minutes as needed for chest pain. 07/19/14   Sueanne Margarita, MD  pantoprazole (PROTONIX) 40 MG tablet TAKE 1 TABLET (40 MG TOTAL) BY MOUTH DAILY. 07/16/15   [name], MD  rivaroxaban (XARELTO) 20 MG TABS tablet TAKE 1 TABLET BY MOUTH EVERY DAY 10/04/15   Eileen Stanford, PA-C  zolpidem (AMBIEN) 5 MG tablet Take 2.5 mg by mouth at bedtime as needed for sleep.    Historical Provider, MD    Family History Family History  Problem Relation Age of Onset  . Heart disease Sister     Good Pastures Syndrome  . Gout Father   .  Lung cancer Father   . Breast cancer Paternal Aunt     Social History Social History  Substance Use Topics  . Smoking status: Former Smoker    Packs/day: 0.30    Years: 44.00    Types: Cigarettes    Quit date: 04/07/2009  . Smokeless tobacco: Never Used  . Alcohol use Yes     Comment: 2 to 3 glasses wine per week     Allergies   Amiodarone and Zyban [bupropion]   Review of Systems Review of Systems  Constitutional: Negative for fever.  Cardiovascular: Positive for chest pain and palpitations.  Gastrointestinal: Positive for vomiting.  All other systems reviewed and are negative.    Physical Exam Updated Vital Signs There were no vitals taken for this visit.  Physical Exam  Constitutional: She is oriented to person, place, and time. She appears well-developed and well-nourished. No distress.  HENT:  Head: Normocephalic and atraumatic.  Eyes: Conjunctivae and EOM are normal.  Neck: Neck supple. No tracheal deviation present.  Cardiovascular: Normal rate.   Pulmonary/Chest: Effort normal. No respiratory distress.    Musculoskeletal: Normal range of motion.  Neurological: She is alert and oriented to person, place, and time.  Skin: Skin is warm and dry.  Psychiatric: She has a normal mood and affect. Her behavior is normal.  Nursing note and vitals reviewed.    ED Treatments / Results  Labs (all labs ordered are listed, but only abnormal results are displayed) Labs Reviewed - No data to display  EKG  EKG Interpretation None       Radiology No results found.  Procedures .Cardioversion Date/Time: 11/19/2015 2:39 AM Performed by: Amy [name] Authorized by: Amy [name]   Consent:    Consent obtained:  Verbal   Consent given by:  Patient   Risks discussed:  Cutaneous burn, induced arrhythmia and pain   Alternatives discussed:  Rate-control medication Pre-procedure details:    Cardioversion basis:  Emergent   Rhythm:  Atrial fibrillation Attempt one:    Cardioversion mode:  Synchronous   Waveform:  Monophasic   Shock (Joules):  150   Shock outcome:  Conversion to normal sinus rhythm Post-procedure details:    Patient status:  Awake   Patient tolerance of procedure:  Tolerated well, no immediate complications   (including critical care time)  DIAGNOSTIC STUDIES: Oxygen Saturation is 95% on RA, adequate by my interpretation.    COORDINATION OF CARE: 12:08 AM Discussed treatment plan with pt at bedside and pt agreed to plan.   Medications Ordered in ED Medications - No data to display   Initial Impression / Assessment and Plan / ED Course  I have reviewed the triage vital signs and the nursing notes.  Pertinent labs & imaging results that were available during my care of the patient were reviewed by me and considered in my medical decision making (see chart for details).  Clinical Course    ZUHA DEJONGE is a 70 y.o. female here with rapid afib. Hx of afib on xarelto. She took lopressor with minimal relief. I think she is a good candidate for  cardioversion. I counseled her regarding risks and benefits of cardioversion.   2:40 AM Cardioversion performed, went back to sinus rhythm. Now awake, alert. Labs at baseline. She is taken off on Multaq recently and will put her back on and have her see cardiology outpatient.    I personally performed the services described in this documentation, which was scribed in my presence.  The recorded information has been reviewed and is accurate.   Final Clinical Impressions(s) / ED Diagnoses   Final diagnoses:  None    New Prescriptions New Prescriptions   No medications on file     Amy Freeze, MD 11/19/15 [phone]

## 2015-11-19 NOTE — Discharge Instructions (Signed)
Restart taking your multaq.   Continue your other meds.  Call cardiology in the morning for follow up.  Return to ER if you have worse chest pain, shortness of breath, palpitations.

## 2015-11-19 NOTE — ED Triage Notes (Signed)
Pt arrives via EMS from home. States she was sitting at home watching TV and felt her heart start beating fast and hard. States the same happened yesterday and she took her Metoprolol and it went away. States that she tried taking it today and had no relief. EMS gave 324 ASA. Pt initially c/o chest pain that went away after the ASA. EMS tried vagal maneuvers which failed. Pt in [place] 142, regular.

## 2015-11-19 NOTE — Telephone Encounter (Signed)
Pt called in stating she was in the ER last night and had to be cardioverted for afib. Pt had recently been taken off her multaq following ablation by Dr. Rayann Heman. Pt states she has gone back into afib again this morning. She went ahead and took a dose of Multaq plus her morning dose of metoprolol and her HR is 70-80s now. Discussed with Roderic Palau, NP instructed to stay on Multaq BID and follow up tomorrow. Pt verbalized understanding.

## 2015-11-20 ENCOUNTER — Ambulatory Visit (HOSPITAL_COMMUNITY)
Admission: RE | Admit: 2015-11-20 | Discharge: 2015-11-20 | Disposition: A | Discharge status: Home / Self Care | Payer: Medicare Other | Source: Ambulatory Visit | Attending: Nurse Practitioner | Admitting: Nurse Practitioner

## 2015-11-20 ENCOUNTER — Encounter (HOSPITAL_COMMUNITY): Payer: Self-pay | Admitting: Nurse Practitioner

## 2015-11-20 VITALS — BP 122/74 | HR 64 | Ht 63.5 in | Wt 211.2 lb

## 2015-11-20 DIAGNOSIS — I4891 Unspecified atrial fibrillation: Secondary | ICD-10-CM | POA: Insufficient documentation

## 2015-11-20 DIAGNOSIS — R9431 Abnormal electrocardiogram [ECG] [EKG]: Secondary | ICD-10-CM | POA: Insufficient documentation

## 2015-11-20 DIAGNOSIS — I481 Persistent atrial fibrillation: Secondary | ICD-10-CM

## 2015-11-20 DIAGNOSIS — I4819 Other persistent atrial fibrillation: Secondary | ICD-10-CM

## 2015-11-20 NOTE — Progress Notes (Signed)
Patient ID: Amy [name], female   DOB: 04-Jun-1945, 70 y.o.   MRN: [id number]     Primary Care Physician: Aretta Nip, MD Referring Physician: Dr. Roseanna Rainbow is a 70 y.o. female with a h/o persistent symptomatic afib that underwent afib ablation 1/17. She saw Dr. Rayann Heman  8/9 and because she had done so well with very little afib burden, he stopped multaq. She went back into afib Saturday and was able to convert with extra metoprolol. But it came back Sunday and she vomited when she tried to take her meds. She reported to the ER where she was cardioverted and returned to [facility]. She was placed back on her multaq and is SR today. She had diarrhea with multaq but rather take this than have afib.  Today, she denies symptoms of palpitations, chest pain, shortness of breath, orthopnea, PND, lower extremity edema, dizziness, presyncope, syncope, or neurologic sequela. The patient is tolerating medications without difficulties and is otherwise without complaint today.   Past Medical History:  Diagnosis Date  . Apical variant hypertrophic cardiomyopathy ([place])    Diagnosed by echo and ECG 11/13  . Atypical atrial flutter ([place])   . CAD (coronary artery disease)    a. LHC in 10/2011 demonstrated non-obstructive disease and an 80% lesion in a small D1 treated medically.   . Chronic diastolic heart failure ([place])   . CKD (chronic kidney disease), stage III   . COPD (chronic obstructive pulmonary disease) ([place])   . Dizziness    had PT for being off balance - in approx. 2012  . GERD (gastroesophageal reflux disease)   . Hyperlipidemia   . Hypertension   . Hypothyroid   . Lumbar disc disease   . Macular degeneration   . OSA (obstructive sleep apnea)   . Persistent atrial fibrillation ([place])       . Pulmonary hypertension ([place])    a. [place] 05/2013 showed mild PAH with normal PCWP and RA pressure, could be related to OSA and low oxygen saturation.   . Refusal of blood  transfusions as patient is Jehovah's Witness   . Sinus bradycardia    Past Surgical History:  Procedure Laterality Date  . CARDIAC CATHETERIZATION     normal coronary arteries  . CARDIAC CATHETERIZATION N/A 10/31/2014   Procedure: Left Heart Cath and Coronary Angiography;  Surgeon: Leonie Man, MD;  Location: [facility] CV LAB;  Service: Cardiovascular;  Laterality: N/A;  . CATARACT EXTRACTION Bilateral   . CESAREAN SECTION  1983  . ELECTROPHYSIOLOGIC STUDY N/A 04/24/2015   Procedure: Atrial Fibrillation Ablation;  Surgeon: [name], MD;  Location: [facility] CV LAB;  Service: Cardiovascular;  Laterality: N/A;  . RIGHT HEART CATHETERIZATION N/A 06/01/2013   Procedure: RIGHT HEART CATH;  Surgeon: Larey Dresser, MD;  Location: [facility] CATH LAB;  Service: Cardiovascular;  Laterality: N/A;  . TEE WITHOUT CARDIOVERSION N/A 04/23/2015   Procedure: TRANSESOPHAGEAL ECHOCARDIOGRAM (TEE);  Surgeon: Josue Hector, MD;  Location: [facility] ENDOSCOPY;  Service: Cardiovascular;  Laterality: N/A;    Current Outpatient Prescriptions  Medication Sig Dispense Refill  . albuterol (PROVENTIL HFA;VENTOLIN HFA) 108 (90 BASE) MCG/ACT inhaler Inhale 2 puffs into the lungs every 6 (six) hours as needed for wheezing or shortness of breath.     Marland Kitchen atorvastatin (LIPITOR) 80 MG tablet Take 1 tablet (80 mg total) by mouth daily with lunch. 90 tablet 3  . Cholecalciferol (VITAMIN D) 2000 UNITS CAPS Take 2,000 Units by mouth  daily.     . colchicine 0.6 MG tablet Take 0.6 mg by mouth daily.     Marland Kitchen dronedarone (MULTAQ) 400 MG tablet Take 400 mg by mouth 2 (two) times daily with a meal.    . ezetimibe (ZETIA) 10 MG tablet TAKE 1 TABLET (10 MG TOTAL) BY MOUTH DAILY. 90 tablet 3  . furosemide (LASIX) 20 MG tablet Take 1 tablet (20 mg total) by mouth 2 (two) times daily. (Patient taking differently: Take 20 mg by mouth daily. ) 180 tablet 1  . levothyroxine (SYNTHROID, LEVOTHROID) 100 MCG tablet Take 100 mcg by mouth daily before  breakfast.    . metoprolol tartrate (LOPRESSOR) 25 MG tablet Take 0.5 tablets (12.5 mg total) by mouth 2 (two) times daily. 180 tablet 1  . Multiple Vitamin (MULTIVITAMIN) capsule Take 1 capsule by mouth daily.    . nitroGLYCERIN (NITROSTAT) 0.4 MG SL tablet Place 1 tablet (0.4 mg total) under the tongue every 5 (five) minutes as needed for chest pain. 25 tablet 3  . pantoprazole (PROTONIX) 40 MG tablet TAKE 1 TABLET (40 MG TOTAL) BY MOUTH DAILY. 90 tablet 1  . rivaroxaban (XARELTO) 20 MG TABS tablet TAKE 1 TABLET BY MOUTH EVERY DAY 90 tablet 1  . zolpidem (AMBIEN) 5 MG tablet Take 2.5 mg by mouth at bedtime as needed for sleep.     No current facility-administered medications for this encounter.     Allergies  Allergen Reactions  . Amiodarone     Dyspnea - felt to be 2/2 amio lung toxicity Has tolerated Omnipaque   . Zyban [Bupropion] Itching    Social History   Social History  . Marital status: Widowed    Spouse name: divorced.  . Number of children: 1  . Years of education: N/A   Occupational History  . retired. still works for [employer].     Social History Main Topics  . Smoking status: Former Smoker    Packs/day: 0.30    Years: 44.00    Types: Cigarettes    Quit date: 04/07/2009  . Smokeless tobacco: Never Used  . Alcohol use Yes     Comment: 2 to 3 glasses wine per week  . Drug use: No  . Sexual activity: No   Other Topics Concern  . Not on file   Social History Narrative   Pt lives alone with 2 dogs.     Family History  Problem Relation Age of Onset  . Heart disease Sister     [place] Pastures Syndrome  . Gout Father   . Lung cancer Father   . Breast cancer Paternal Aunt     ROS- All systems are reviewed and negative except as per the HPI above  Physical Exam: Vitals:   11/20/15 1021  BP: 122/74  Pulse: 64  Weight: 211 lb 3.2 oz (95.8 kg)  Height: 5' 3.5" (1.613 m)    GEN- The patient is well appearing, alert and oriented x 3 today.   Head-  normocephalic, atraumatic Eyes-  Sclera clear, conjunctiva pink Ears- hearing intact Oropharynx- clear Neck- supple, no JVP Lymph- no cervical lymphadenopathy Lungs- Clear to ausculation bilaterally, normal work of breathing Heart- Regular rate and rhythm, no murmurs, rubs or gallops, PMI not laterally displaced GI- soft, NT, ND, + BS Extremities- no clubbing, cyanosis, or edema MS- no significant deformity or atrophy Skin- no rash or lesion Psych- euthymic mood, full affect Neuro- strength and sensation are intact  EKG-Sinus rhythm at 64 bpm, pr int  150 ms, qrs int 80 ms, qtc 460 ms, LVH Epic records reviewed  Assessment and Plan: 1. Persistent afib Went back to afib after d/c of multaq after 10 days off drug S/p afib ablation 1/17 and successful ER cardioversion 8/14 Placed back on  multaq 400 mg bid, metoprolol bid Continue xarelto without missed doses  F/u with afib clinic as needed  F/u with Dr. Caryl Comes in 3 months, Dr. Radford Pax as scheduled 12/17 Afib clininc as needed  Butch Penny C. Zorian Gunderman, [facility] [address] [place], [place] [address] [phone]

## 2015-11-21 ENCOUNTER — Ambulatory Visit: Payer: Medicare Other | Admitting: Physical Therapy

## 2015-11-21 ENCOUNTER — Encounter: Payer: Self-pay | Admitting: Physical Therapy

## 2015-11-21 DIAGNOSIS — M25512 Pain in left shoulder: Secondary | ICD-10-CM | POA: Diagnosis not present

## 2015-11-21 DIAGNOSIS — M6281 Muscle weakness (generalized): Secondary | ICD-10-CM

## 2015-11-21 DIAGNOSIS — M25511 Pain in right shoulder: Secondary | ICD-10-CM | POA: Diagnosis not present

## 2015-11-21 DIAGNOSIS — M25612 Stiffness of left shoulder, not elsewhere classified: Secondary | ICD-10-CM | POA: Diagnosis not present

## 2015-11-21 DIAGNOSIS — R293 Abnormal posture: Secondary | ICD-10-CM | POA: Diagnosis not present

## 2015-11-21 NOTE — Therapy (Signed)
[facility] [address], [place], [place], [address] Phone: [phone]   Fax:  [phone]  Physical Therapy Treatment  Patient Details  Name: Amy [name] MRN: [id number] Date of Birth: 1946-02-26 Referring Provider: Cari Caraway, MD  Encounter Date: 11/21/2015      PT End of Session - 11/21/15 1427    Visit Number 11   Number of Visits 20   Date for PT Re-Evaluation 12/11/15   Authorization Type medicare g-code on 10th visit   PT Start Time 1400   PT Stop Time 1436   PT Time Calculation (min) 36 min   Activity Tolerance Patient tolerated treatment well   Behavior During Therapy [facility] for tasks assessed/performed      Past Medical History:  Diagnosis Date  . Apical variant hypertrophic cardiomyopathy ([place])    Diagnosed by echo and ECG 11/13  . Atypical atrial flutter ([place])   . CAD (coronary artery disease)    a. LHC in 10/2011 demonstrated non-obstructive disease and an 80% lesion in a small D1 treated medically.   . Chronic diastolic heart failure ([place])   . CKD (chronic kidney disease), stage III   . COPD (chronic obstructive pulmonary disease) ([place])   . Dizziness    had PT for being off balance - in approx. 2012  . GERD (gastroesophageal reflux disease)   . Hyperlipidemia   . Hypertension   . Hypothyroid   . Lumbar disc disease   . Macular degeneration   . OSA (obstructive sleep apnea)   . Persistent atrial fibrillation ([place])       . Pulmonary hypertension ([place])    a. [place] 05/2013 showed mild PAH with normal PCWP and RA pressure, could be related to OSA and low oxygen saturation.   . Refusal of blood transfusions as patient is Jehovah's Witness   . Sinus bradycardia     Past Surgical History:  Procedure Laterality Date  . CARDIAC CATHETERIZATION     normal coronary arteries  . CARDIAC CATHETERIZATION N/A 10/31/2014   Procedure: Left Heart Cath and Coronary Angiography;  Surgeon: Leonie Man, MD;  Location: [place] CV LAB;  Service: Cardiovascular;  Laterality: N/A;  . CATARACT EXTRACTION Bilateral   . CESAREAN SECTION  1983  . ELECTROPHYSIOLOGIC STUDY N/A 04/24/2015   Procedure: Atrial Fibrillation Ablation;  Surgeon: [name], MD;  Location: [place] CV LAB;  Service: Cardiovascular;  Laterality: N/A;  . RIGHT HEART CATHETERIZATION N/A 06/01/2013   Procedure: RIGHT HEART CATH;  Surgeon: Larey Dresser, MD;  Location: [facility] CATH LAB;  Service: Cardiovascular;  Laterality: N/A;  . TEE WITHOUT CARDIOVERSION N/A 04/23/2015   Procedure: TRANSESOPHAGEAL ECHOCARDIOGRAM (TEE);  Surgeon: Josue Hector, MD;  Location: [facility] ENDOSCOPY;  Service: Cardiovascular;  Laterality: N/A;    There were no vitals filed for this visit.      Subjective Assessment - 11/21/15 1403    Subjective Pt reports shoulder not feeling right. Denies pain but states shoulder feeling weak.    Pertinent History Fall in a hotel shower 09/09/15 resulting in Rt and Lt shoulder pain   Diagnostic tests x-ray: at hospital after fall.  Negative per pt report.     Patient Stated Goals reduce Lt shoulder pain                         [facility] Adult PT Treatment/Exercise - 11/21/15 0001      Shoulder Exercises: Seated  Row Strengthening;Left;10 reps  bent on counter     Shoulder Exercises: Standing   Flexion Strengthening;Left;20 reps;Weights   ABduction AROM;Strengthening;Both;10 reps  sliding up wall; 2 sets   Row Strengthening;Left;10 reps;Weights  3 sets; leaning over counter   Other Standing Exercises wall push up 15x; arms at side                  PT Short Term Goals - 11/21/15 1440      PT SHORT TERM GOAL #1   Title be independent in initial HEP   Time 4   Period Weeks   Status Achieved     PT SHORT TERM GOAL #2   Title report < or = to 3/10 Lt shoulder pain with reaching overhead and across the body   Time 4   Period Weeks   Status Achieved     PT SHORT  TERM GOAL #3   Title report a 30% reduction in bil. shoulder pain with use with ADLs and self-care   Time 4   Period Weeks   Status Achieved           PT Long Term Goals - 11/21/15 1440      PT LONG TERM GOAL #1   Title be independent in advanced HEP   Time 8   Period Weeks   Status On-going     PT LONG TERM GOAL #2   Title reduce FOTO to < or = to 38% limitation   Time 8   Period Weeks   Status On-going     PT LONG TERM GOAL #3   Title demonstrate 4+/5 bilateral shoulder strength to improve endurance for use with self-care and ADLs   Time 8   Period Weeks   Status On-going               Plan - 11/21/15 1433    Clinical Impression Statement Pt continueing to increase strength in upper back and left shoulder. Has some difficulty with extension due to pain in anteriror shoulder.    Rehab Potential Good   Clinical Impairments Affecting Rehab Potential None   PT Frequency 2x / week   PT Duration 8 weeks   PT Treatment/Interventions ADLs/Self Care Home Management;Cryotherapy;Electrical Stimulation;Moist Heat;Therapeutic exercise;Therapeutic activities;Ultrasound;Neuromuscular re-education;Patient/family education;Manual techniques;Taping;Dry needling;Passive range of motion   PT Next Visit Plan continue with strengthening the left shoulder for abduction, flexion, ER, midrange abduction and scapular, use modalities as needed   PT Home Exercise Plan progress as needed   Consulted and Agree with Plan of Care Patient      Patient will benefit from skilled therapeutic intervention in order to improve the following deficits and impairments:  Postural dysfunction, Decreased strength, Impaired flexibility, Pain, Decreased activity tolerance  Visit Diagnosis: Pain in left shoulder  Stiffness of left shoulder, not elsewhere classified  Muscle weakness (generalized)  Pain in right shoulder  Abnormal posture     Problem List Patient Active Problem List    Diagnosis Date Noted  . A-fib ([place]) 04/24/2015  . Atrial flutter with rapid ventricular response ([place])   . Atrial fibrillation with rapid ventricular response ([place])   . Elevated troponin   . NSTEMI (non-ST elevated myocardial infarction) ([place]) 10/29/2014  . Chest pain 10/28/2014  . Atrial flutter with RVR 10/28/2014  . Obesity-BMI 38 07/19/2014  . Chronic anticoagulation 07/19/2014  . CAD (coronary artery disease) 07/19/2014  . Chronic renal insufficiency, stage III (moderate) 07/19/2014  . Dyslipidemia 04/12/2014  . Pulmonary hypertension ([place])  05/12/2013  . OSA (obstructive sleep apnea) 04/13/2013  . Apical variant hypertrophic cardiomyopathy ([place]) 03/05/2012  . Chronic diastolic heart failure ([place])   . Paroxysmal atrial fibrillation/flutter   . Sinus bradycardia   . COPD (chronic obstructive pulmonary disease) ([place]) 03/04/2012  . Hypothyroidism 03/04/2012  . Essential hypertension 03/04/2012  . DOE (dyspnea on exertion) 12/24/2010    Mikle Bosworth PTA 11/21/2015, 2:42 PM  [facility] [address] [place], [place], [address] Phone: [phone]   Fax:  [phone]  Name: IVELISSE CULVERHOUSE MRN: [id number] Date of Birth: December 25, 1945

## 2015-11-27 ENCOUNTER — Ambulatory Visit: Payer: Medicare Other | Admitting: Physical Therapy

## 2015-11-27 ENCOUNTER — Encounter: Payer: Self-pay | Admitting: Physical Therapy

## 2015-11-27 DIAGNOSIS — M25511 Pain in right shoulder: Secondary | ICD-10-CM | POA: Diagnosis not present

## 2015-11-27 DIAGNOSIS — R293 Abnormal posture: Secondary | ICD-10-CM

## 2015-11-27 DIAGNOSIS — M25512 Pain in left shoulder: Secondary | ICD-10-CM | POA: Diagnosis not present

## 2015-11-27 DIAGNOSIS — M6281 Muscle weakness (generalized): Secondary | ICD-10-CM | POA: Diagnosis not present

## 2015-11-27 DIAGNOSIS — M25612 Stiffness of left shoulder, not elsewhere classified: Secondary | ICD-10-CM

## 2015-11-27 NOTE — Therapy (Signed)
[facility] [address], [place], [place], [address] Phone: [phone]   Fax:  [phone]  Physical Therapy Treatment  Patient Details  Name: Amy [name] MRN: [id number] Date of Birth: 1945/08/08 Referring Provider: Cari Caraway, MD  Encounter Date: 11/27/2015      PT End of Session - 11/27/15 1458    Visit Number 12   Number of Visits 20   Date for PT Re-Evaluation 12/11/15   Authorization Type medicare g-code on 10th visit   PT Start Time 1404   PT Stop Time 1445   PT Time Calculation (min) 41 min   Activity Tolerance Patient tolerated treatment well   Behavior During Therapy [facility] for tasks assessed/performed      Past Medical History:  Diagnosis Date  . Apical variant hypertrophic cardiomyopathy ([place])    Diagnosed by echo and ECG 11/13  . Atypical atrial flutter ([place])   . CAD (coronary artery disease)    a. LHC in 10/2011 demonstrated non-obstructive disease and an 80% lesion in a small D1 treated medically.   . Chronic diastolic heart failure ([place])   . CKD (chronic kidney disease), stage III   . COPD (chronic obstructive pulmonary disease) ([place])   . Dizziness    had PT for being off balance - in approx. 2012  . GERD (gastroesophageal reflux disease)   . Hyperlipidemia   . Hypertension   . Hypothyroid   . Lumbar disc disease   . Macular degeneration   . OSA (obstructive sleep apnea)   . Persistent atrial fibrillation ([place])       . Pulmonary hypertension ([place])    a. [place] 05/2013 showed mild PAH with normal PCWP and RA pressure, could be related to OSA and low oxygen saturation.   . Refusal of blood transfusions as patient is Jehovah's Witness   . Sinus bradycardia     Past Surgical History:  Procedure Laterality Date  . CARDIAC CATHETERIZATION     normal coronary arteries  . CARDIAC CATHETERIZATION N/A 10/31/2014   Procedure: Left Heart Cath and Coronary Angiography;  Surgeon: Leonie Man, MD;  Location: [place] CV LAB;  Service: Cardiovascular;  Laterality: N/A;  . CATARACT EXTRACTION Bilateral   . CESAREAN SECTION  1983  . ELECTROPHYSIOLOGIC STUDY N/A 04/24/2015   Procedure: Atrial Fibrillation Ablation;  Surgeon: [name], MD;  Location: [facility] CV LAB;  Service: Cardiovascular;  Laterality: N/A;  . RIGHT HEART CATHETERIZATION N/A 06/01/2013   Procedure: RIGHT HEART CATH;  Surgeon: Larey Dresser, MD;  Location: [facility] CATH LAB;  Service: Cardiovascular;  Laterality: N/A;  . TEE WITHOUT CARDIOVERSION N/A 04/23/2015   Procedure: TRANSESOPHAGEAL ECHOCARDIOGRAM (TEE);  Surgeon: Josue Hector, MD;  Location: [facility] ENDOSCOPY;  Service: Cardiovascular;  Laterality: N/A;    There were no vitals filed for this visit.      Subjective Assessment - 11/27/15 1407    Subjective Pt reports shoulder is feeling "normal" and denies pain. Pt stated that the only time the shoulder feels weak is when picking things up such as her purse or a drinking glass.    Pertinent History Fall in a hotel shower 09/09/15 resulting in Rt and Lt shoulder pain   Diagnostic tests x-ray: at hospital after fall.  Negative per pt report.     Patient Stated Goals reduce Lt shoulder pain   Currently in Pain? No/denies  [facility] Adult PT Treatment/Exercise - 11/27/15 0001      Shoulder Exercises: Supine   External Rotation AROM;Strengthening;Left;20 reps;Theraband   Theraband Level (Shoulder External Rotation) Level 1 (Yellow)   Internal Rotation AROM;Strengthening;Left;20 reps;Theraband   Theraband Level (Shoulder Internal Rotation) Level 1 (Yellow)   Flexion AROM;Strengthening;Left;10 reps;Weights   Other Supine Exercises PNF D1 D2 yellow tband     Shoulder Exercises: Sidelying   External Rotation Strengthening;Right;Left;20 reps     Shoulder Exercises: Standing   Horizontal ABduction AAROM;Strengthening;Both;20 reps;Theraband   Row Strengthening;Left;10  reps;Weights  3 sets; leaning over counter     Shoulder Exercises: ROM/Strengthening   UBE (Upper Arm Bike) Level 1x 6 minutes (3/3)  30 seconds Lt hand only     Manual Therapy   Manual Therapy Joint mobilization   Manual therapy comments Shoulder PROM stretching L shoulder   Joint Mobilization Sup and inf glides L shoulder                  PT Short Term Goals - 11/27/15 1453      PT SHORT TERM GOAL #1   Title be independent in initial HEP   Time 4   Period Weeks   Status Achieved     PT SHORT TERM GOAL #2   Title report < or = to 3/10 Lt shoulder pain with reaching overhead and across the body   Time 4   Period Weeks   Status Achieved     PT SHORT TERM GOAL #3   Title report a 30% reduction in bil. shoulder pain with use with ADLs and self-care   Time 4   Period Weeks   Status Achieved           PT Long Term Goals - 11/27/15 1453      PT LONG TERM GOAL #1   Title be independent in advanced HEP   Time 8   Period Weeks   Status On-going     PT LONG TERM GOAL #2   Title reduce FOTO to < or = to 38% limitation   Time 8   Period Weeks   Status On-going     PT LONG TERM GOAL #3   Title demonstrate 4+/5 bilateral shoulder strength to improve endurance for use with self-care and ADLs   Time 8   Period Weeks   Status On-going     PT LONG TERM GOAL #4   Title report a 60% reduction in bil shoulder pain with use with ALDs and self-care   Time 8   Period Weeks   Status Achieved     PT LONG TERM GOAL #5   Title report no limitations with use of Lt UE with overhead activity   Time 8   Period Weeks   Status On-going               Plan - 11/27/15 1458    Clinical Impression Statement Pt reports shoulder is feeling normal and denies pain. Pt stated that the only time her shoulder feels weak is with lifting things such as her purse or her coffee cup. Pt able to tolerate strenghtneing exercises well. PROM and joint mobz to Lt shoudler. Pt had  increased pain described as "tightness" in abduction only. Will continue to sstrengthen and stabilize Lt shoulder and increase scapular stabillity.    Rehab Potential Good   Clinical Impairments Affecting Rehab Potential None   PT Frequency 2x / week   PT Duration 8 weeks  PT Treatment/Interventions ADLs/Self Care Home Management;Cryotherapy;Electrical Stimulation;Moist Heat;Therapeutic exercise;Therapeutic activities;Ultrasound;Neuromuscular re-education;Patient/family education;Manual techniques;Taping;Dry needling;Passive range of motion   PT Next Visit Plan Continue to strengthen shoulder and increase scapular stability. Continue to increase shoulder ROM and decrease pain with abduction.    PT Home Exercise Plan progress as needed   Consulted and Agree with Plan of Care Patient      Patient will benefit from skilled therapeutic intervention in order to improve the following deficits and impairments:  Postural dysfunction, Decreased strength, Impaired flexibility, Pain, Decreased activity tolerance  Visit Diagnosis: Pain in left shoulder  Stiffness of left shoulder, not elsewhere classified  Muscle weakness (generalized)  Pain in right shoulder  Abnormal posture     Problem List Patient Active Problem List   Diagnosis Date Noted  . A-fib ([place]) 04/24/2015  . Atrial flutter with rapid ventricular response ([place])   . Atrial fibrillation with rapid ventricular response ([place])   . Elevated troponin   . NSTEMI (non-ST elevated myocardial infarction) ([place]) 10/29/2014  . Chest pain 10/28/2014  . Atrial flutter with RVR 10/28/2014  . Obesity-BMI 38 07/19/2014  . Chronic anticoagulation 07/19/2014  . CAD (coronary artery disease) 07/19/2014  . Chronic renal insufficiency, stage III (moderate) 07/19/2014  . Dyslipidemia 04/12/2014  . Pulmonary hypertension ([place]) 05/12/2013  . OSA (obstructive sleep apnea) 04/13/2013  . Apical variant hypertrophic cardiomyopathy ([place]) 03/05/2012   . Chronic diastolic heart failure ([place])   . Paroxysmal atrial fibrillation/flutter   . Sinus bradycardia   . COPD (chronic obstructive pulmonary disease) ([place]) 03/04/2012  . Hypothyroidism 03/04/2012  . Essential hypertension 03/04/2012  . DOE (dyspnea on exertion) 12/24/2010    Mikle Bosworth PTA 11/27/2015, 3:07 PM  [facility] [address] [place], [place], [address] Phone: [phone]   Fax:  [phone]  Name: Amy [name] MRN: [id number] Date of Birth: 09-Sep-1945

## 2015-11-29 ENCOUNTER — Encounter: Payer: Medicare Other | Admitting: Physical Therapy

## 2015-12-04 ENCOUNTER — Ambulatory Visit: Payer: Medicare Other | Admitting: Physical Therapy

## 2015-12-04 ENCOUNTER — Encounter: Payer: Self-pay | Admitting: Physical Therapy

## 2015-12-04 DIAGNOSIS — M6281 Muscle weakness (generalized): Secondary | ICD-10-CM | POA: Diagnosis not present

## 2015-12-04 DIAGNOSIS — R293 Abnormal posture: Secondary | ICD-10-CM | POA: Diagnosis not present

## 2015-12-04 DIAGNOSIS — M25612 Stiffness of left shoulder, not elsewhere classified: Secondary | ICD-10-CM

## 2015-12-04 DIAGNOSIS — M25512 Pain in left shoulder: Secondary | ICD-10-CM

## 2015-12-04 DIAGNOSIS — M25511 Pain in right shoulder: Secondary | ICD-10-CM | POA: Diagnosis not present

## 2015-12-04 NOTE — Therapy (Signed)
[facility] [address], [place], [place], [address] Phone: [phone]   Fax:  [phone]  Physical Therapy Treatment  Patient Details  Name: Amy [name] MRN: [id number] Date of Birth: Jul 24, 1945 Referring Provider: Cari Caraway, MD  Encounter Date: 12/04/2015      PT End of Session - 12/04/15 1402    Visit Number 13   Number of Visits 20   Date for PT Re-Evaluation 12/11/15   Authorization Type medicare g-code on 10th visit   PT Start Time 1400   PT Stop Time 1446   PT Time Calculation (min) 46 min   Activity Tolerance Patient tolerated treatment well   Behavior During Therapy [facility] for tasks assessed/performed      Past Medical History:  Diagnosis Date  . Apical variant hypertrophic cardiomyopathy ([place])    Diagnosed by echo and ECG 11/13  . Atypical atrial flutter ([place])   . CAD (coronary artery disease)    a. LHC in 10/2011 demonstrated non-obstructive disease and an 80% lesion in a small D1 treated medically.   . Chronic diastolic heart failure ([place])   . CKD (chronic kidney disease), stage III   . COPD (chronic obstructive pulmonary disease) ([place])   . Dizziness    had PT for being off balance - in approx. 2012  . GERD (gastroesophageal reflux disease)   . Hyperlipidemia   . Hypertension   . Hypothyroid   . Lumbar disc disease   . Macular degeneration   . OSA (obstructive sleep apnea)   . Persistent atrial fibrillation ([place])       . Pulmonary hypertension ([place])    a. [place] 05/2013 showed mild PAH with normal PCWP and RA pressure, could be related to OSA and low oxygen saturation.   . Refusal of blood transfusions as patient is Jehovah's Witness   . Sinus bradycardia     Past Surgical History:  Procedure Laterality Date  . CARDIAC CATHETERIZATION     normal coronary arteries  . CARDIAC CATHETERIZATION N/A 10/31/2014   Procedure: Left Heart Cath and Coronary Angiography;  Surgeon: Leonie Man, MD;  Location: [place] CV LAB;  Service: Cardiovascular;  Laterality: N/A;  . CATARACT EXTRACTION Bilateral   . CESAREAN SECTION  1983  . ELECTROPHYSIOLOGIC STUDY N/A 04/24/2015   Procedure: Atrial Fibrillation Ablation;  Surgeon: [name], MD;  Location: [place] CV LAB;  Service: Cardiovascular;  Laterality: N/A;  . RIGHT HEART CATHETERIZATION N/A 06/01/2013   Procedure: RIGHT HEART CATH;  Surgeon: Larey Dresser, MD;  Location: [facility] CATH LAB;  Service: Cardiovascular;  Laterality: N/A;  . TEE WITHOUT CARDIOVERSION N/A 04/23/2015   Procedure: TRANSESOPHAGEAL ECHOCARDIOGRAM (TEE);  Surgeon: Josue Hector, MD;  Location: [facility] ENDOSCOPY;  Service: Cardiovascular;  Laterality: N/A;    There were no vitals filed for this visit.      Subjective Assessment - 12/04/15 1401    Subjective Pt reports shoulder feels good today. Denies any pain at the moment. Reports shoulder feels stronger with lifting things such as her purse. Pt states she is useing shoulder for over head use with no pain or limitations.    Pertinent History Fall in a hotel shower 09/09/15 resulting in Rt and Lt shoulder pain   Diagnostic tests x-ray: at hospital after fall.  Negative per pt report.     Patient Stated Goals reduce Lt shoulder pain   Pain Onset 1 to 4 weeks ago  [facility] Adult PT Treatment/Exercise - 12/04/15 0001      Shoulder Exercises: Supine   External Rotation AROM;Strengthening;Left;20 reps;Theraband   Theraband Level (Shoulder External Rotation) Level 2 (Red)   Internal Rotation AROM;Strengthening;Left;20 reps;Theraband   Theraband Level (Shoulder Internal Rotation) Level 2 (Red)     Shoulder Exercises: Standing   Horizontal ABduction AAROM;Strengthening;Both;20 reps;Theraband   External Rotation AROM;Strengthening   Flexion Strengthening;Left;20 reps;Weights   ABduction AROM;Strengthening;Both;10 reps  #2   Other Standing Exercises isometric wall  circles  1 min clockwise 1 min counter clock   Other Standing Exercises weighted carry     Shoulder Exercises: ROM/Strengthening   UBE (Upper Arm Bike) Level 1x 6 minutes (3/3)  while therapist assesing pain     Manual Therapy   Manual Therapy Joint mobilization   Manual therapy comments Shoulder PROM stretching L shoulder   Joint Mobilization Sup and inf glides L shoulder   Soft tissue mobilization Ant delt manual massage                  PT Short Term Goals - 12/04/15 1402      PT SHORT TERM GOAL #1   Title be independent in initial HEP   Time 4   Period Weeks   Status Achieved     PT SHORT TERM GOAL #2   Title report < or = to 3/10 Lt shoulder pain with reaching overhead and across the body   Time 4   Period Weeks   Status Achieved     PT SHORT TERM GOAL #3   Title report a 30% reduction in bil. shoulder pain with use with ADLs and self-care   Time 4   Period Weeks   Status Achieved           PT Long Term Goals - 12/04/15 1402      PT LONG TERM GOAL #1   Title be independent in advanced HEP   Time 8   Period Weeks   Status On-going     PT LONG TERM GOAL #2   Title reduce FOTO to < or = to 38% limitation   Time 8   Period Weeks   Status On-going     PT LONG TERM GOAL #3   Title demonstrate 4+/5 bilateral shoulder strength to improve endurance for use with self-care and ADLs   Time 8   Period Weeks   Status On-going     PT LONG TERM GOAL #4   Title report a 60% reduction in bil shoulder pain with use with ALDs and self-care   Time 8   Period Weeks   Status Achieved     PT LONG TERM GOAL #5   Title report no limitations with use of Lt UE with overhead activity   Time 8   Period Weeks   Status Achieved               Plan - 12/04/15 1426    Clinical Impression Statement Pt reports shoulder is feeling good. Pt is able to use shoulder for over head activities with no increase in pain. Continue to have some pain with internal  rotation and horizontal adduction such as when pulling up the covers. Continues to have limited PROM in flexion and abduction. Tenderness in ant delt that decreased with manual massage.    Rehab Potential Good   Clinical Impairments Affecting Rehab Potential None   PT Frequency 2x / week   PT Duration 8 weeks   PT  Treatment/Interventions ADLs/Self Care Home Management;Cryotherapy;Electrical Stimulation;Moist Heat;Therapeutic exercise;Therapeutic activities;Ultrasound;Neuromuscular re-education;Patient/family education;Manual techniques;Taping;Dry needling;Passive range of motion   PT Next Visit Plan Plan to do FOTO at next treatment. New exercises at next treatment. Continue to increase functional shoulder strength.   PT Home Exercise Plan progress as needed   Consulted and Agree with Plan of Care Patient      Patient will benefit from skilled therapeutic intervention in order to improve the following deficits and impairments:  Postural dysfunction, Decreased strength, Impaired flexibility, Pain, Decreased activity tolerance  Visit Diagnosis: Pain in left shoulder  Stiffness of left shoulder, not elsewhere classified  Muscle weakness (generalized)     Problem List Patient Active Problem List   Diagnosis Date Noted  . A-fib ([place]) 04/24/2015  . Atrial flutter with rapid ventricular response ([place])   . Atrial fibrillation with rapid ventricular response ([place])   . Elevated troponin   . NSTEMI (non-ST elevated myocardial infarction) ([place]) 10/29/2014  . Chest pain 10/28/2014  . Atrial flutter with RVR 10/28/2014  . Obesity-BMI 38 07/19/2014  . Chronic anticoagulation 07/19/2014  . CAD (coronary artery disease) 07/19/2014  . Chronic renal insufficiency, stage III (moderate) 07/19/2014  . Dyslipidemia 04/12/2014  . Pulmonary hypertension ([place]) 05/12/2013  . OSA (obstructive sleep apnea) 04/13/2013  . Apical variant hypertrophic cardiomyopathy ([place]) 03/05/2012  . Chronic diastolic  heart failure ([place])   . Paroxysmal atrial fibrillation/flutter   . Sinus bradycardia   . COPD (chronic obstructive pulmonary disease) ([place]) 03/04/2012  . Hypothyroidism 03/04/2012  . Essential hypertension 03/04/2012  . DOE (dyspnea on exertion) 12/24/2010    Mikle Bosworth PTA 12/04/2015, 2:50 PM  [facility] [address] [place], [place], [address] Phone: [phone]   Fax:  [phone]  Name: Amy [name] MRN: [id number] Date of Birth: [birth year]

## 2015-12-06 ENCOUNTER — Encounter: Payer: Self-pay | Admitting: Physical Therapy

## 2015-12-06 ENCOUNTER — Ambulatory Visit: Payer: Medicare Other | Admitting: Physical Therapy

## 2015-12-06 DIAGNOSIS — M25512 Pain in left shoulder: Secondary | ICD-10-CM | POA: Diagnosis not present

## 2015-12-06 DIAGNOSIS — M25612 Stiffness of left shoulder, not elsewhere classified: Secondary | ICD-10-CM | POA: Diagnosis not present

## 2015-12-06 DIAGNOSIS — M6281 Muscle weakness (generalized): Secondary | ICD-10-CM | POA: Diagnosis not present

## 2015-12-06 DIAGNOSIS — M25511 Pain in right shoulder: Secondary | ICD-10-CM | POA: Diagnosis not present

## 2015-12-06 DIAGNOSIS — R293 Abnormal posture: Secondary | ICD-10-CM | POA: Diagnosis not present

## 2015-12-06 NOTE — Therapy (Addendum)
[facility] [address], [place], [place], [address] Phone: [phone]   Fax:  [phone]  Physical Therapy Treatment  Patient Details  Name: Amy [name] MRN: [id number] Date of Birth: 03/25/1946 Referring Provider: Cari Caraway, MD  Encounter Date: 12/06/2015      PT End of Session - 12/06/15 1444    Visit Number 14   Number of Visits 20   Date for PT Re-Evaluation 12/11/15   Authorization Type medicare g-code on 10th visit   PT Start Time 1400   PT Stop Time 1446   PT Time Calculation (min) 46 min   Activity Tolerance Patient tolerated treatment well   Behavior During Therapy [facility] for tasks assessed/performed      Past Medical History:  Diagnosis Date  . Apical variant hypertrophic cardiomyopathy ([place])    Diagnosed by echo and ECG 11/13  . Atypical atrial flutter ([place])   . CAD (coronary artery disease)    a. LHC in 10/2011 demonstrated non-obstructive disease and an 80% lesion in a small D1 treated medically.   . Chronic diastolic heart failure ([place])   . CKD (chronic kidney disease), stage III   . COPD (chronic obstructive pulmonary disease) ([place])   . Dizziness    had PT for being off balance - in approx. 2012  . GERD (gastroesophageal reflux disease)   . Hyperlipidemia   . Hypertension   . Hypothyroid   . Lumbar disc disease   . Macular degeneration   . OSA (obstructive sleep apnea)   . Persistent atrial fibrillation ([place])       . Pulmonary hypertension ([place])    a. [place] 05/2013 showed mild PAH with normal PCWP and RA pressure, could be related to OSA and low oxygen saturation.   . Refusal of blood transfusions as patient is Jehovah's Witness   . Sinus bradycardia     Past Surgical History:  Procedure Laterality Date  . CARDIAC CATHETERIZATION     normal coronary arteries  . CARDIAC CATHETERIZATION N/A 10/31/2014   Procedure: Left Heart Cath and Coronary Angiography;  Surgeon: Leonie Man, MD;  Location: [place] CV LAB;  Service: Cardiovascular;  Laterality: N/A;  . CATARACT EXTRACTION Bilateral   . CESAREAN SECTION  1983  . ELECTROPHYSIOLOGIC STUDY N/A 04/24/2015   Procedure: Atrial Fibrillation Ablation;  Surgeon: [name], MD;  Location: [place] CV LAB;  Service: Cardiovascular;  Laterality: N/A;  . RIGHT HEART CATHETERIZATION N/A 06/01/2013   Procedure: RIGHT HEART CATH;  Surgeon: Larey Dresser, MD;  Location: [facility] CATH LAB;  Service: Cardiovascular;  Laterality: N/A;  . TEE WITHOUT CARDIOVERSION N/A 04/23/2015   Procedure: TRANSESOPHAGEAL ECHOCARDIOGRAM (TEE);  Surgeon: Josue Hector, MD;  Location: [facility] ENDOSCOPY;  Service: Cardiovascular;  Laterality: N/A;    There were no vitals filed for this visit.      Subjective Assessment - 12/06/15 1414    Subjective Pt reports no pain today with activities. Has avoided reaching for the covers with the Lt arm for fear of pain.    Pertinent History Fall in a hotel shower 09/09/15 resulting in Rt and Lt shoulder pain   Diagnostic tests x-ray: at hospital after fall.  Negative per pt report.     Patient Stated Goals reduce Lt shoulder pain   Currently in Pain? No/denies      G-code: functional assessment tool used is FOTO 20% limitation; functional limitation is other primary; goal status is CJ; discharge status is  Rennie Plowman, PT 12/11/15 3:07 PM                     [facility] Adult PT Treatment/Exercise - 12/06/15 0001      Shoulder Exercises: Supine   Flexion Both;10 reps;AAROM   ABduction AAROM;Both;10 reps     Shoulder Exercises: Standing   Horizontal ABduction AAROM;Strengthening;Both;20 reps;Theraband   Flexion Strengthening;Left;20 reps;Weights   ABduction AROM;Strengthening;Both;10 reps  #2   Extension AROM;Strengthening;Both;20 reps;Theraband     Shoulder Exercises: ROM/Strengthening   UBE (Upper Arm Bike) Level 1x 6 minutes (3/3)  while therapist assesing pain     Manual  Therapy   Manual Therapy Joint mobilization   Manual therapy comments Scapualr mobz   Joint Mobilization Sup and Inf glides  supine                  PT Short Term Goals - 12/04/15 1402      PT SHORT TERM GOAL #1   Title be independent in initial HEP   Time 4   Period Weeks   Status Achieved     PT SHORT TERM GOAL #2   Title report < or = to 3/10 Lt shoulder pain with reaching overhead and across the body   Time 4   Period Weeks   Status Achieved     PT SHORT TERM GOAL #3   Title report a 30% reduction in bil. shoulder pain with use with ADLs and self-care   Time 4   Period Weeks   Status Achieved           PT Long Term Goals - 12/04/15 1402      PT LONG TERM GOAL #1   Title be independent in advanced HEP   Time 8   Period Weeks   Status On-going     PT LONG TERM GOAL #2   Title reduce FOTO to < or = to 38% limitation   Time 8   Period Weeks   Status On-going     PT LONG TERM GOAL #3   Title demonstrate 4+/5 bilateral shoulder strength to improve endurance for use with self-care and ADLs   Time 8   Period Weeks   Status On-going     PT LONG TERM GOAL #4   Title report a 60% reduction in bil shoulder pain with use with ALDs and self-care   Time 8   Period Weeks   Status Achieved     PT LONG TERM GOAL #5   Title report no limitations with use of Lt UE with overhead activity   Time 8   Period Weeks   Status Achieved               Plan - 12/06/15 1702    Clinical Impression Statement Pt reports shoulder is feeling good. Has not been having difficulty with lifting or picking up items. Continues to have some pain with reaching to pull up covers but has been avaoiding doing this with Lt hand. Pt given new home exrecises today and new theraband.    Rehab Potential Good   Clinical Impairments Affecting Rehab Potential None   PT Frequency 2x / week   PT Duration 8 weeks   PT Treatment/Interventions ADLs/Self Care Home  Management;Cryotherapy;Electrical Stimulation;Moist Heat;Therapeutic exercise;Therapeutic activities;Ultrasound;Neuromuscular re-education;Patient/family education;Manual techniques;Taping;Dry needling;Passive range of motion   PT Next Visit Plan Plan to do FOTO at next treatment. New exercises at next treatment. Continue to increase functional shoulder strength.  PT Home Exercise Plan Arm and shoulder strengthening exercises   Consulted and Agree with Plan of Care Patient      Patient will benefit from skilled therapeutic intervention in order to improve the following deficits and impairments:  Postural dysfunction, Decreased strength, Impaired flexibility, Pain, Decreased activity tolerance  Visit Diagnosis: Pain in left shoulder  Stiffness of left shoulder, not elsewhere classified  Muscle weakness (generalized)     Problem List Patient Active Problem List   Diagnosis Date Noted  . A-fib ([place]) 04/24/2015  . Atrial flutter with rapid ventricular response ([place])   . Atrial fibrillation with rapid ventricular response ([place])   . Elevated troponin   . NSTEMI (non-ST elevated myocardial infarction) ([place]) 10/29/2014  . Chest pain 10/28/2014  . Atrial flutter with RVR 10/28/2014  . Obesity-BMI 38 07/19/2014  . Chronic anticoagulation 07/19/2014  . CAD (coronary artery disease) 07/19/2014  . Chronic renal insufficiency, stage III (moderate) 07/19/2014  . Dyslipidemia 04/12/2014  . Pulmonary hypertension ([place]) 05/12/2013  . OSA (obstructive sleep apnea) 04/13/2013  . Apical variant hypertrophic cardiomyopathy ([place]) 03/05/2012  . Chronic diastolic heart failure ([place])   . Paroxysmal atrial fibrillation/flutter   . Sinus bradycardia   . COPD (chronic obstructive pulmonary disease) ([place]) 03/04/2012  . Hypothyroidism 03/04/2012  . Essential hypertension 03/04/2012  . DOE (dyspnea on exertion) 12/24/2010    Mikle Bosworth PTA 12/06/2015, 5:08 PM  [facility] [address], [place], [place], [address] Phone: [phone]   Fax:  [phone]  Name: Amy [name] MRN: [id number] Date of Birth: 04-23-1945   PHYSICAL THERAPY DISCHARGE SUMMARY  Visits from Start of Care: 14  Current functional level related to goals / functional outcomes: See above. Unable to assess long term goals with measurements due to patient not able to attend her last visit and wanted to be discharged.    Remaining deficits: See above   Education / Equipment: HEP  Plan: Patient agrees to discharge.  Patient goals were partially met. Patient is being discharged due to the patient's request.  Thank you for the referral. Earlie Counts, PT 12/11/15 3:06 PM  ?????

## 2015-12-06 NOTE — Patient Instructions (Signed)
(  Home) Extension / Flexion (Assist)    Face anchor, right arm as far forward and up as is pain free. Pull arm down toward side. Repeat _10___ times per set. Do _2___ sets per session. *RED BAND*   Copyright  VHI. All rights reserved.  Horizontal Abduction (Resistive Band)    With arms at shoulder level, keep elbows straight. Using other arm as anchor, pull involved arm outward. Repeat ___10_ times. Do __2__ sets. *Keep elbows straight and use; YELLOW BAND*  Copyright  VHI. All rights reserved.  Wall Push    Stand at arm's length from wall, feet shoulder width apart. Inhale while gently leaning toward wall. Hold 1 count. Exhale while pushing back to starting position. Repeat __10__ times per set. Do __2__ sets per session.  Copyright  VHI. All rights reserved.   Jeanie Sewer PTA [facility] [address] [place], [place] [address] Phone # [phone] Fax [phone]

## 2015-12-11 ENCOUNTER — Ambulatory Visit: Payer: Medicare Other | Admitting: Physical Therapy

## 2015-12-19 DIAGNOSIS — I509 Heart failure, unspecified: Secondary | ICD-10-CM | POA: Diagnosis not present

## 2015-12-19 DIAGNOSIS — Z23 Encounter for immunization: Secondary | ICD-10-CM | POA: Diagnosis not present

## 2015-12-19 DIAGNOSIS — E039 Hypothyroidism, unspecified: Secondary | ICD-10-CM | POA: Diagnosis not present

## 2015-12-19 DIAGNOSIS — E7800 Pure hypercholesterolemia, unspecified: Secondary | ICD-10-CM | POA: Diagnosis not present

## 2015-12-19 DIAGNOSIS — Z0000 Encounter for general adult medical examination without abnormal findings: Secondary | ICD-10-CM | POA: Diagnosis not present

## 2015-12-19 DIAGNOSIS — I10 Essential (primary) hypertension: Secondary | ICD-10-CM | POA: Diagnosis not present

## 2015-12-19 DIAGNOSIS — I480 Paroxysmal atrial fibrillation: Secondary | ICD-10-CM | POA: Diagnosis not present

## 2015-12-19 DIAGNOSIS — Z6837 Body mass index (BMI) 37.0-37.9, adult: Secondary | ICD-10-CM | POA: Diagnosis not present

## 2015-12-19 DIAGNOSIS — E669 Obesity, unspecified: Secondary | ICD-10-CM | POA: Diagnosis not present

## 2015-12-28 ENCOUNTER — Encounter: Payer: Self-pay | Admitting: Internal Medicine

## 2016-01-24 ENCOUNTER — Other Ambulatory Visit: Payer: Self-pay | Admitting: Internal Medicine

## 2016-01-24 ENCOUNTER — Other Ambulatory Visit (HOSPITAL_COMMUNITY): Payer: Self-pay | Admitting: Nurse Practitioner

## 2016-01-31 ENCOUNTER — Encounter: Payer: Self-pay | Admitting: Internal Medicine

## 2016-02-12 ENCOUNTER — Ambulatory Visit: Payer: Medicare Other | Admitting: Internal Medicine

## 2016-02-13 ENCOUNTER — Encounter: Payer: Self-pay | Admitting: Internal Medicine

## 2016-02-13 ENCOUNTER — Ambulatory Visit (INDEPENDENT_AMBULATORY_CARE_PROVIDER_SITE_OTHER): Payer: Medicare Other | Admitting: Internal Medicine

## 2016-02-13 VITALS — BP 102/78 | HR 56 | Ht 63.5 in | Wt 206.0 lb

## 2016-02-13 DIAGNOSIS — I481 Persistent atrial fibrillation: Secondary | ICD-10-CM

## 2016-02-13 DIAGNOSIS — R0600 Dyspnea, unspecified: Secondary | ICD-10-CM

## 2016-02-13 DIAGNOSIS — I25118 Atherosclerotic heart disease of native coronary artery with other forms of angina pectoris: Secondary | ICD-10-CM | POA: Diagnosis not present

## 2016-02-13 DIAGNOSIS — I4819 Other persistent atrial fibrillation: Secondary | ICD-10-CM

## 2016-02-13 DIAGNOSIS — Z79899 Other long term (current) drug therapy: Secondary | ICD-10-CM | POA: Diagnosis not present

## 2016-02-13 LAB — BASIC METABOLIC PANEL
BUN: 24 mg/dL (ref 7–25)
CALCIUM: 9.2 mg/dL (ref 8.6–10.4)
CO2: 22 mmol/L (ref 20–31)
CREATININE: 1.51 mg/dL — AB (ref 0.60–0.93)
Chloride: 106 mmol/L (ref 98–110)
GLUCOSE: 76 mg/dL (ref 65–99)
Potassium: 4.1 mmol/L (ref 3.5–5.3)
Sodium: 138 mmol/L (ref 135–146)

## 2016-02-13 NOTE — Progress Notes (Signed)
Patient Care Team: Aretta Nip, MD as PCP - General (Family Medicine)   HPI  Amy [name] is a 70 y.o. female Seen in follow for the apical variant of hypertrophic cardiomyopathy in conjunction with atrial fibrillation and severe left atrial enlargement. She underwent catheter ablation by Dr. Greggory Brandy January 17 and was last seen by him 8/17 at which time she was having no interval symptomatic atrial fibrillation  She has one daughter and a sister. Her sister died years ago in the context of complex immunological disease. She has some kind of heart condition. He complains of significant dyspnea on exertion.   She did diagnosed in the past with COPD. She has been on inhalers in the past. She takes diuretics without significant appreciable benefit    Past Medical History:  Diagnosis Date  . Apical variant hypertrophic cardiomyopathy ([place])    Diagnosed by echo and ECG 11/13  . Atypical atrial flutter ([place])   . CAD (coronary artery disease)    a. LHC in 10/2011 demonstrated non-obstructive disease and an 80% lesion in a small D1 treated medically.   . Chronic diastolic heart failure ([place])   . CKD (chronic kidney disease), stage III   . COPD (chronic obstructive pulmonary disease) ([place])   . Dizziness    had PT for being off balance - in approx. 2012  . GERD (gastroesophageal reflux disease)   . Hyperlipidemia   . Hypertension   . Hypothyroid   . Lumbar disc disease   . Macular degeneration   . OSA (obstructive sleep apnea)   . Persistent atrial fibrillation ([place])       . Pulmonary hypertension    a. [place] 05/2013 showed mild PAH with normal PCWP and RA pressure, could be related to OSA and low oxygen saturation.   . Refusal of blood transfusions as patient is Jehovah's Witness   . Sinus bradycardia     Past Surgical History:  Procedure Laterality Date  . CARDIAC CATHETERIZATION     normal coronary arteries  . CARDIAC CATHETERIZATION N/A 10/31/2014   Procedure:  Left Heart Cath and Coronary Angiography;  Surgeon: Leonie Man, MD;  Location: [place] CV LAB;  Service: Cardiovascular;  Laterality: N/A;  . CATARACT EXTRACTION Bilateral   . CESAREAN SECTION  1983  . ELECTROPHYSIOLOGIC STUDY N/A 04/24/2015   Procedure: Atrial Fibrillation Ablation;  Surgeon: [name], MD;  Location: [place] CV LAB;  Service: Cardiovascular;  Laterality: N/A;  . RIGHT HEART CATHETERIZATION N/A 06/01/2013   Procedure: RIGHT HEART CATH;  Surgeon: Larey Dresser, MD;  Location: [facility] CATH LAB;  Service: Cardiovascular;  Laterality: N/A;  . TEE WITHOUT CARDIOVERSION N/A 04/23/2015   Procedure: TRANSESOPHAGEAL ECHOCARDIOGRAM (TEE);  Surgeon: Josue Hector, MD;  Location: [facility] ENDOSCOPY;  Service: Cardiovascular;  Laterality: N/A;    Current Outpatient Prescriptions  Medication Sig Dispense Refill  . atorvastatin (LIPITOR) 80 MG tablet Take 1 tablet (80 mg total) by mouth daily with lunch. 90 tablet 3  . Cholecalciferol (VITAMIN D) 2000 UNITS CAPS Take 2,000 Units by mouth daily.     . colchicine 0.6 MG tablet Take 0.6 mg by mouth daily.     Marland Kitchen ezetimibe (ZETIA) 10 MG tablet TAKE 1 TABLET (10 MG TOTAL) BY MOUTH DAILY. 90 tablet 3  . furosemide (LASIX) 20 MG tablet Take 1 tablet (20 mg total) by mouth 2 (two) times daily. (Patient taking differently: Take 20 mg by mouth daily. ) 180 tablet 1  .  levothyroxine (SYNTHROID, LEVOTHROID) 100 MCG tablet Take 100 mcg by mouth daily before breakfast.    . metoprolol tartrate (LOPRESSOR) 25 MG tablet Take 0.5 tablets (12.5 mg total) by mouth 2 (two) times daily. 180 tablet 1  . MULTAQ 400 MG tablet TAKE 1 TABLET BY MOUTH TWICE A DAY WITH A MEAL 180 tablet 1  . Multiple Vitamin (MULTIVITAMIN) capsule Take 1 capsule by mouth daily.    . pantoprazole (PROTONIX) 40 MG tablet TAKE 1 TABLET (40 MG TOTAL) BY MOUTH DAILY. 90 tablet 2  . rivaroxaban (XARELTO) 20 MG TABS tablet TAKE 1 TABLET BY MOUTH EVERY DAY 90 tablet 1  . zolpidem  (AMBIEN) 5 MG tablet Take 2.5 mg by mouth at bedtime as needed for sleep.    Marland Kitchen albuterol (PROVENTIL HFA;VENTOLIN HFA) 108 (90 BASE) MCG/ACT inhaler Inhale 2 puffs into the lungs every 6 (six) hours as needed for wheezing or shortness of breath.     . nitroGLYCERIN (NITROSTAT) 0.4 MG SL tablet Place 1 tablet (0.4 mg total) under the tongue every 5 (five) minutes as needed for chest pain. (Patient not taking: Reported on 02/13/2016) 25 tablet 3   No current facility-administered medications for this visit.     Allergies  Allergen Reactions  . Amiodarone     Dyspnea - felt to be 2/2 amio lung toxicity Has tolerated Omnipaque   . Zyban [Bupropion] Itching      Review of Systems negative except from HPI and PMH  Physical Exam BP 102/78 (BP Location: Left Arm, Patient Position: Sitting, Cuff Size: Large)   Pulse (!) 56   Ht 5' 3.5" (1.613 m)   Wt 206 lb (93.4 kg)   SpO2 95%   BMI 35.92 kg/m  Well developed and well nourished in no acute distress HENT normal E scleral and icterus clear Neck Supple JVP 6-8; carotids brisk and full Clear to ausculation  Regular rate and rhythm, no murmurs gallops or rub Soft with active bowel sounds No clubbing cyanosis  Edema Alert and oriented, grossly normal motor and sensory function Skin Warm and Dry  ECG demonstrates sinus rhythm at 50 Intervals 15/10/49 Striking repolarization abdomen was in the inferolateral leads   Assessment and  Plan  Apical HCM  Atrial fibrillation status post ablation  High Risk Medication Surveillance-dronaderone  Dyspnea on exertion  COPD  Her chart was reviewed as relates to her dyspnea. Catheterization demonstrated no significant pulmonary hypertension chest x-rays are not particularly enlightening.  We will obtain a cardiopulmonary stress test to try to give [name] some guidance with this. She is also thought to have some degree of HFpEF. I think this is likely. She might benefit from cardiac  rehabilitation. We will arrange for follow-up with Dr. Radford Pax as scheduled. We will see her again as needed.  It has been recommended to her that her daughter and her niece both echocardiograms to exclude apical HCM    Current medicines are reviewed at length with the patient today .  The patient does not have concerns regarding medicines.

## 2016-02-13 NOTE — Patient Instructions (Addendum)
Medication Instructions: Your physician recommends that you continue on your current medications as directed. Please refer to the Current Medication list given to you today.   Labwork: None Ordered  Procedures/Testing: Your physician has recommended that you have a cardiopulmonary stress test (CPX). CPX testing is a non-invasive measurement of heart and lung function. It replaces a traditional treadmill stress test. This type of test provides a tremendous amount of information that relates not only to your present condition but also for future outcomes. This test combines measurements of you ventilation, respiratory gas exchange in the lungs, electrocardiogram (EKG), blood pressure and physical response before, during, and following an exercise protocol.    Follow-Up: Your physician recommends that you keep your scheduled follow-up appointment with Dr. Radford Pax on 03/18/16.   Your physician recommends that you schedule a follow-up appointment with Dr. Caryl Comes as needed   Any Additional Special Instructions Will Be Listed Below (If Applicable).  --Have your daughter call the office and arrange for her Echocardiogram for HCM screening.--   If you need a refill on your cardiac medications before your next appointment, please call your pharmacy.

## 2016-02-28 ENCOUNTER — Other Ambulatory Visit: Payer: Self-pay | Admitting: Cardiology

## 2016-02-28 ENCOUNTER — Other Ambulatory Visit (HOSPITAL_COMMUNITY): Payer: Self-pay | Admitting: Nurse Practitioner

## 2016-03-06 ENCOUNTER — Ambulatory Visit (HOSPITAL_COMMUNITY): Payer: Medicare Other | Attending: Internal Medicine

## 2016-03-06 DIAGNOSIS — R0609 Other forms of dyspnea: Secondary | ICD-10-CM | POA: Diagnosis not present

## 2016-03-06 DIAGNOSIS — R0600 Dyspnea, unspecified: Secondary | ICD-10-CM

## 2016-03-13 ENCOUNTER — Telehealth: Payer: Self-pay | Admitting: Cardiology

## 2016-03-13 NOTE — Telephone Encounter (Signed)
Amy [name] is returning your call . Thanks

## 2016-03-13 NOTE — Telephone Encounter (Signed)
Patient called to follow-up on Dr. Theodosia Blender recommendations to [place].  She states she would rather not stop the medication because last time she stopped, she "ended up in the ED with Afib." She wants to know in detail why the medication needs to be stopped. She understands she will be called in the AM to follow up.  She has an OV with Dr. Radford Pax scheduled 12/12.  To Dr. Radford Pax.

## 2016-03-13 NOTE — Telephone Encounter (Signed)
Amy [name], Patient does not want to stop Multaq - any recommendations

## 2016-03-18 ENCOUNTER — Ambulatory Visit: Payer: Medicare Other | Admitting: Cardiology

## 2016-03-21 ENCOUNTER — Encounter: Payer: Self-pay | Admitting: Internal Medicine

## 2016-03-21 ENCOUNTER — Ambulatory Visit (INDEPENDENT_AMBULATORY_CARE_PROVIDER_SITE_OTHER): Payer: Medicare Other | Admitting: Internal Medicine

## 2016-03-21 VITALS — BP 141/82 | HR 58 | Ht 63.0 in | Wt 203.2 lb

## 2016-03-21 DIAGNOSIS — I422 Other hypertrophic cardiomyopathy: Secondary | ICD-10-CM | POA: Diagnosis not present

## 2016-03-21 DIAGNOSIS — I25118 Atherosclerotic heart disease of native coronary artery with other forms of angina pectoris: Secondary | ICD-10-CM | POA: Diagnosis not present

## 2016-03-21 DIAGNOSIS — I480 Paroxysmal atrial fibrillation: Secondary | ICD-10-CM

## 2016-03-21 NOTE — Patient Instructions (Signed)
Medication Instructions: Your physician recommends that you continue on your current medications as directed. Please refer to the Current Medication list given to you today.   Labwork: None Ordered  Procedures/Testing: None Ordered  Follow-Up: Your physician recommends that you schedule a follow-up appointment in: 3 MONTHS with Dr. Radford Pax.   Any Additional Special Instructions Will Be Listed Below (If Applicable).     If you need a refill on your cardiac medications before your next appointment, please call your pharmacy.

## 2016-03-21 NOTE — Progress Notes (Signed)
Patient Care Team: Aretta Nip, MD as PCP - General (Family Medicine)   HPI  Amy [name] is a 70 y.o. female Seen in follow for the apical variant of hypertrophic cardiomyopathy in conjunction with atrial fibrillation and severe left atrial enlargement. She underwent catheter ablation by Dr. Greggory Brandy January 17 and was last seen by him 8/17 at which time she was having no interval symptomatic atrial fibrillation  She has been managed with dronaderone  She has one daughter and a sister. Her sister died years ago in the context of complex immunological disease. She has some kind of heart condition.    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 Apical variant hypertrophic cardiomyopathy ([place])    Diagnosed by echo and ECG 11/13  . Atypical atrial flutter ([place])   . CAD (coronary artery disease)    a. LHC in 10/2011 demonstrated non-obstructive disease and an 80% lesion in a small D1 treated medically.   . Chronic diastolic heart failure ([place])   . CKD (chronic kidney disease), stage III   . COPD (chronic obstructive pulmonary disease) ([place])   . Dizziness    had PT for being off balance - in approx. 2012  . GERD (gastroesophageal reflux disease)   . Hyperlipidemia   . Hypertension   . Hypothyroid   . Lumbar disc disease   . Macular degeneration   . OSA (obstructive sleep apnea)   . Persistent atrial fibrillation ([place])       . Pulmonary hypertension    a. RHC 05/2013 showed mild PAH with normal PCWP and RA pressure, could be related to OSA and low oxygen saturation.   . Refusal of blood transfusions as patient is Jehovah's Witness   . Sinus bradycardia     Past Surgical History:  Procedure Laterality Date  . CARDIAC CATHETERIZATION     normal coronary arteries  . CARDIAC CATHETERIZATION N/A 10/31/2014   Procedure: Left Heart Cath and Coronary Angiography;  Surgeon: Leonie Man, MD;  Location: [facility];  Service: Cardiovascular;  Laterality: N/A;  . CATARACT EXTRACTION Bilateral   . CESAREAN SECTION  1983  . ELECTROPHYSIOLOGIC STUDY N/A 04/24/2015   Procedure: Atrial Fibrillation Ablation;  Surgeon: [name], MD;  Location: [facility];  Service: Cardiovascular;  Laterality: N/A;  . RIGHT HEART CATHETERIZATION N/A 06/01/2013   Procedure: RIGHT HEART CATH;  Surgeon: Larey Dresser, MD;  Location: [facility] CATH LAB;  Service: Cardiovascular;  Laterality: N/A;  . TEE WITHOUT CARDIOVERSION N/A 04/23/2015   Procedure: TRANSESOPHAGEAL ECHOCARDIOGRAM (TEE);  Surgeon: Josue Hector, MD;  Location: [facility] ENDOSCOPY;  Service: Cardiovascular;  Laterality: N/A;    Current Outpatient Prescriptions  Medication Sig Dispense Refill  . albuterol (PROVENTIL HFA;VENTOLIN HFA) 108 (90 BASE) MCG/ACT inhaler Inhale 2 puffs into the lungs every 6 (six) hours as needed for wheezing or shortness of breath.     Marland Kitchen atorvastatin (LIPITOR) 80 MG tablet Take 1 tablet (80 mg total) by mouth daily with lunch. 90 tablet 3  . Cholecalciferol (VITAMIN D) 2000 UNITS CAPS Take 2,000 Units by mouth daily.     . colchicine 0.6 MG tablet Take 0.6 mg by mouth daily.     Marland Kitchen ezetimibe (ZETIA) 10 MG tablet TAKE 1 TABLET (10 MG TOTAL) BY MOUTH DAILY. 90 tablet 3  . furosemide (LASIX) 20 MG tablet Take 1 tablet (20 mg total)  by mouth 2 (two) times daily. (Patient taking differently: Take 20 mg by mouth daily. ) 180 tablet 1  . levothyroxine (SYNTHROID, LEVOTHROID) 100 MCG tablet Take 100 mcg by mouth daily before breakfast.    . metoprolol tartrate (LOPRESSOR) 25 MG tablet Take 0.5 tablets (12.5 mg total) by mouth 2 (two) times daily. 180 tablet 1  . MULTAQ 400 MG tablet TAKE 1 TABLET BY MOUTH TWICE A DAY WITH A MEAL 180 tablet 1  . Multiple Vitamin (MULTIVITAMIN) capsule Take 1 capsule by mouth daily.    . nitroGLYCERIN (NITROSTAT) 0.4 MG SL tablet Place 1 tablet (0.4 mg total) under the tongue every 5 (five) minutes as  needed for chest pain. 25 tablet 3  . pantoprazole (PROTONIX) 40 MG tablet TAKE 1 TABLET (40 MG TOTAL) BY MOUTH DAILY. 90 tablet 2  . rivaroxaban (XARELTO) 20 MG TABS tablet TAKE 1 TABLET BY MOUTH EVERY DAY 90 tablet 1  . zolpidem (AMBIEN) 5 MG tablet Take 2.5 mg by mouth at bedtime as needed for sleep.     No current facility-administered medications for this visit.     Allergies  Allergen Reactions  . Amiodarone     Dyspnea - felt to be 2/2 amio lung toxicity Has tolerated Omnipaque   . Zyban [Bupropion] Itching      Review of Systems negative except from HPI and PMH  Physical Exam BP (!) 141/82   Pulse (!) 58   Ht _0  (1.6 m)   Wt 203 lb 3.2 oz (92.2 kg)   SpO2 93%   BMI 36.00 kg/m  Well developed and well nourished in no acute distress HENT normal E scleral and icterus clear Neck Supple JVP 6-8; carotids brisk and full Clear to ausculation  Regular rate and rhythm, no murmurs gallops or rub Soft with active bowel sounds No clubbing cyanosis  Edema Alert and oriented, grossly normal motor and sensory function Skin Warm and Dry  ECG demonstrates sinus rhythm at 50 Intervals 15/10/49 Striking repolarization abdomen was in the inferolateral leads   Assessment and  Plan  Apical HCM  Atrial fibrillation status post ablation  High Risk Medication Surveillance-dronaderone  Dyspnea on exertion  COPD  She has no toher drug options for her AFib; her QT precluding sotalol or disopyramide and having had amio lung toxicity  She is dealing with the assoc diarrhea  She is not in the high risk group of dronaderone  Will have her followup with TT  If she has recurrent aFib  Would have Dr Greggory Brandy consider repeat ablation       Current medicines are reviewed at length with the patient today .  The patient does not have concerns regarding medicines.

## 2016-05-26 ENCOUNTER — Other Ambulatory Visit (INDEPENDENT_AMBULATORY_CARE_PROVIDER_SITE_OTHER): Payer: Medicare Other

## 2016-05-26 ENCOUNTER — Encounter: Payer: Self-pay | Admitting: Internal Medicine

## 2016-05-26 ENCOUNTER — Ambulatory Visit (INDEPENDENT_AMBULATORY_CARE_PROVIDER_SITE_OTHER): Payer: Medicare Other | Admitting: Internal Medicine

## 2016-05-26 ENCOUNTER — Ambulatory Visit (INDEPENDENT_AMBULATORY_CARE_PROVIDER_SITE_OTHER)
Admission: RE | Admit: 2016-05-26 | Discharge: 2016-05-26 | Disposition: A | Discharge status: Home / Self Care | Payer: Medicare Other | Source: Ambulatory Visit | Attending: Internal Medicine | Admitting: Internal Medicine

## 2016-05-26 VITALS — BP 108/62 | HR 60 | Ht 62.5 in | Wt 199.4 lb

## 2016-05-26 DIAGNOSIS — R0609 Other forms of dyspnea: Secondary | ICD-10-CM | POA: Diagnosis not present

## 2016-05-26 DIAGNOSIS — E785 Hyperlipidemia, unspecified: Secondary | ICD-10-CM | POA: Diagnosis not present

## 2016-05-26 DIAGNOSIS — E039 Hypothyroidism, unspecified: Secondary | ICD-10-CM

## 2016-05-26 DIAGNOSIS — J449 Chronic obstructive pulmonary disease, unspecified: Secondary | ICD-10-CM

## 2016-05-26 DIAGNOSIS — I272 Pulmonary hypertension, unspecified: Secondary | ICD-10-CM

## 2016-05-26 LAB — CBC WITH DIFFERENTIAL/PLATELET
BASOS PCT: 1 % (ref 0.0–3.0)
Basophils Absolute: 0.1 10*3/uL (ref 0.0–0.1)
EOS PCT: 2.9 % (ref 0.0–5.0)
Eosinophils Absolute: 0.2 10*3/uL (ref 0.0–0.7)
HCT: 43 % (ref 36.0–46.0)
Hemoglobin: 13.6 g/dL (ref 12.0–15.0)
LYMPHS ABS: 1.5 10*3/uL (ref 0.7–4.0)
Lymphocytes Relative: 18.1 % (ref 12.0–46.0)
MCHC: 31.7 g/dL (ref 30.0–36.0)
MCV: 92.8 fl (ref 78.0–100.0)
MONO ABS: 0.8 10*3/uL (ref 0.1–1.0)
MONOS PCT: 9.8 % (ref 3.0–12.0)
NEUTROS PCT: 68.2 % (ref 43.0–77.0)
Neutro Abs: 5.6 10*3/uL (ref 1.4–7.7)
Platelets: 263 10*3/uL (ref 150.0–400.0)
RBC: 4.63 Mil/uL (ref 3.87–5.11)
RDW: 17.6 % — ABNORMAL HIGH (ref 11.5–15.5)
WBC: 8.2 10*3/uL (ref 4.0–10.5)

## 2016-05-26 MED ORDER — GLYCOPYRROLATE-FORMOTEROL 9-4.8 MCG/ACT IN AERO: 2.0000 | INHALATION_SPRAY | Freq: Two times a day (BID) | RESPIRATORY_TRACT | 0 refills | Status: DC

## 2016-05-26 NOTE — Patient Instructions (Signed)
Bevespi Take 2 puffs first thing in am and then another 2 puffs about 12 hours later.   Work on inhaler technique:  relax and gently blow all the way out then take a nice smooth deep breath back in, triggering the inhaler at same time you start breathing in.  Hold for up to 5 seconds if you can. Blow out thru nose. Rinse and gargle with water when done    Please remember to go to the lab and x-ray department downstairs in the basement  for your tests - we will call you with the results when they are available.    Please schedule a follow up office visit in 4 weeks, sooner if needed with pfts

## 2016-05-26 NOTE — Progress Notes (Addendum)
Subjective:   Patient ID: Amy [name], female    DOB: [birth year], .   MRN: [id number]  Dr Gwenette Greet 05/12/13 final OV:  seen in 2013 where her dyspnea on exertion was felt secondary to minimal obstructive disease, obesity and deconditioning, and also diastolic dysfunction.complaining of progressive dyspnea on exertion x one  year. In the interim, she's been diagnosed with atrial fibrillation, and has been on amiodarone for over a year. She was in the hospital one month prior to [place]   for a mild pneumonia with pleurisy, but feels that she has gotten over this and return to baseline. While in the hospital, she underwent an echocardiogram that showed a normal ejection fraction, but she did have mild MR and moderate left atrial dilatation. Her peak PA pressures were 72 by echo. She has had a left and right cardiac catheterization in 2013 that showed coronary disease, as well as an increased left ventricular end-diastolic pressure consistent with diastolic dysfunction. The patient denies any symptoms of acute bronchospasm. Her weight has increased 9 pounds since the last visit here. rec Discontinue advair.  You have very little airflow obstruction on your most recent breathing studies. Will give you the flu shot today.  Will send a note to Dr. Radford Pax to consider doing a right heart cath to evaluate your pulmonary hypertension. Will check bloodwork today to see if your amiodarone may be contributing to your shortness of breath. Work on weight loss, and stay on cpap compliantly.  Will arrange followup once your results are available.       05/26/2016 1st [facility] office visit/ Amy [name]  / smoker with GOLD I copd if use fev1/VC ratio / on multaq /xarelto Chief Complaint  Patient presents with  . Pulmonary Consult    Self referral. Pt c/o DOE with "any activity"  for the past several months.    sitting ok RA no sob  Sleeping on cpap s 02 feels rested/ f/u by Dr Radford Pax Has had to ride the  scooter at [facility] x at least a year / uses [facility] parking  Baseline wt = 210  No cough or leg swelling  No better with saba in past  Doe = MMRC3 = can't walk 100 yards even at a slow pace at a flat grade s stopping due to sob    No obvious day to day or daytime variability or assoc excess/ purulent sputum or mucus plugs or hemoptysis or cp or chest tightness, subjective wheeze or overt sinus or hb symptoms. No unusual exp hx or h/o childhood pna/ asthma or knowledge of premature birth.  Sleeping ok without nocturnal  or early am exacerbation  of respiratory  c/o's or need for noct saba. Also denies any obvious fluctuation of symptoms with weather or environmental changes or other aggravating or alleviating factors except as outlined above   Current Medications, Allergies, Complete Past Medical History, Past Surgical History, Family History, and Social History were reviewed in [employer] record.  ROS  The following are not active complaints unless bolded sore throat, dysphagia, dental problems, itching, sneezing,  nasal congestion or excess/ purulent secretions, ear ache,   fever, chills, sweats, unintended wt loss, classically pleuritic or exertional cp,  orthopnea pnd or leg swelling, presyncope, palpitations, abdominal pain, anorexia, nausea, vomiting, diarrhea  or change in bowel or bladder habits, change in stools or urine, dysuria,hematuria,  rash, arthralgias, visual complaints, headache, numbness, weakness or ataxia or problems with walking or coordination,  change in  mood/affect or memory.                   Objective:   Physical Exam  amb bf nad    Wt Readings from Last 3 Encounters:  05/26/16 199 lb 6.4 oz (90.4 kg)  03/21/16 203 lb 3.2 oz (92.2 kg)  02/13/16 206 lb (93.4 kg)    Vital signs reviewed - Note on arrival 02 sats  91% on RA    HEENT: nl dentition, turbinates bilaterally, and oropharynx. Nl external ear canals without cough reflex   NECK :   without JVD/Nodes/TM/ nl carotid upstrokes bilaterally   LUNGS: no acc muscle use,  Nl contour chest which is clear to A and P bilaterally without cough on insp or exp maneuvers   CV:  RRR  no s3 or murmur or increase in P2, and no edema   ABD:  soft and nontender with nl inspiratory excursion in the supine position. No bruits or organomegaly appreciated, bowel sounds nl  MS:  Nl gait/ ext warm without deformities, calf tenderness, cyanosis or clubbing No obvious joint restrictions   SKIN: warm and dry without lesions    NEURO:  alert, approp, nl sensorium with  no motor or cerebellar deficits apparent.    CXR PA and Lateral:   05/26/2016 :    I personally reviewed images and agree with radiology impression as follows:   COPD. No acute pneumonia. Cardiomegaly with mild central pulmonary vascular congestion. No definite pulmonary edema.   Labs ordered/ reviewed:      Chemistry      Component Value Date/Time   NA 141 05/26/2016 1711   K 4.1 05/26/2016 1711   CL 109 05/26/2016 1711   CO2 18 (L) 05/26/2016 1711   BUN 26 (H) 05/26/2016 1711   CREATININE 1.80 (H) 05/26/2016 1711   CREATININE 1.51 (H) 02/13/2016 1544      Component Value Date/Time   CALCIUM 9.0 05/26/2016 1711   ALKPHOS 112 11/19/2015 0025   AST 22 11/19/2015 0025   ALT 28 11/19/2015 0025   BILITOT 0.5 11/19/2015 0025        Lab Results  Component Value Date   WBC 8.2 05/26/2016   HGB 13.6 05/26/2016   HCT 43.0 05/26/2016   MCV 92.8 05/26/2016   PLT 263.0 05/26/2016         Lab Results  Component Value Date   TSH 0.34 (L) 05/26/2016     Lab Results  Component Value Date   PROBNP 86.0 04/21/2014                Assessment & Plan:

## 2016-05-27 LAB — BASIC METABOLIC PANEL
BUN: 26 mg/dL — ABNORMAL HIGH (ref 6–23)
CALCIUM: 9 mg/dL (ref 8.4–10.5)
CO2: 18 mEq/L — ABNORMAL LOW (ref 19–32)
CREATININE: 1.8 mg/dL — AB (ref 0.40–1.20)
Chloride: 109 mEq/L (ref 96–112)
GFR: 35.66 mL/min — AB (ref >60.00)
GLUCOSE: 90 mg/dL (ref 70–99)
Potassium: 4.1 mEq/L (ref 3.5–5.1)
SODIUM: 141 meq/L (ref 135–145)

## 2016-05-27 LAB — TSH: TSH: 0.34 u[IU]/mL — ABNORMAL LOW (ref 0.35–4.50)

## 2016-05-27 NOTE — Assessment & Plan Note (Signed)
I don't see any RHC data from Feb 2015 but most of her [place] appears to be from elevated L sided pressures.

## 2016-05-27 NOTE — Assessment & Plan Note (Signed)
PFT's 05/2013:  FEV1 1.41 (82%), ratio 77 (ratio 69 if use fev1/vc)  Classic copd curvature/  DLCO 8.85 (41%) corrects to 60% for alv vol  - 05/26/2016  After extensive coaching HFA effectiveness =    75% > bevespi trial   I agree with Dr Gwenette Greet she only has mild copd but could have component of cardiac asthma and both may respond to lama/laba trial but first need to be sure she's using it correctly and consistently and have her return for new set of pfts on it to see if can correlate any of her symptoms with airflow obst or not.   Total time devoted to counseling  > 50 % of initial 60 min office visit:  review case with pt/ discussion of options/alternatives/ personally creating written customized instructions  in presence of pt  then going over those specific  Instructions directly with the pt including how to use all of the meds but in particular covering each new medication in detail and the difference between the maintenance= "automatic" meds and the prns using an action plan format for the latter (If this problem/symptom => do that organization reading Left to right).  Please see AVS from this visit for a full list of these instructions which I personally wrote for this pt and  are unique to this visit.

## 2016-05-27 NOTE — Assessment & Plan Note (Signed)
Body mass index is 35.89 kg/m.  trending down/ encouraged Lab Results  Component Value Date   TSH 7.828 (H) 10/31/2014     Contributing to gerd risk/ doe/reviewed the need and the process to achieve and maintain neg calorie balance > defer f/u primary care including intermittently monitoring thyroid status

## 2016-05-27 NOTE — Assessment & Plan Note (Signed)
Lab Results  Component Value Date   TSH 0.34 (L) 05/26/2016    May need the thyroid dose adjusted up > Follow up per Primary Care planned

## 2016-05-27 NOTE — Assessment & Plan Note (Addendum)
PFT"s 12/2010:  FEV1 1.83 (89%), ratio 75, TLC 3.93 (84%), DLCO 10.6 (46%) Cath 10/2011: CAD, increased LVEDP c/w DD  Echo 04/2013: nl EF, mild MR, mod LA dilatation, peak pa pressure 58m estimated PFT's 05/2013:  FEV1 1.41 (82%), ratio 77, minimal airtrapping, TLC 3.28 (68%), DLCO 8.85 (41%) CT chest 04/2013: small focus of ASDZ deep in right base (pna), GGO noted in LUL of ?etiology Pt on amiodarone for greater than one year >> ESR 61 05/12/2013 > dc'd  - CPST 03/07/16 Although a submaximal effort, exercise testing reveals a severe circulatory limitation consistent with advanced heart failure. Her obesity is also playing a role - 05/26/2016   Walked RA  2 laps @ 185 ft each stopped due to  Sob no desats  Clearly multifactorial but mostly related to diastolic dysfunction, CRI with ? Met acidosis,  obesity and deconditioning at this point.  She is not exerting at a high enough level with adls to desat or benefit from 02 at this point   Will have her return for repeat full pfts

## 2016-05-28 ENCOUNTER — Telehealth: Payer: Self-pay | Admitting: Internal Medicine

## 2016-05-28 NOTE — Telephone Encounter (Signed)
Called and spoke with pt and she is aware of results per MW> labs have been faxed to Dr. Radene Ou.

## 2016-05-28 NOTE — Progress Notes (Signed)
LMTCB

## 2016-05-28 NOTE — Progress Notes (Signed)
LMTCB

## 2016-06-17 ENCOUNTER — Telehealth: Payer: Self-pay | Admitting: Internal Medicine

## 2016-06-17 MED ORDER — GLYCOPYRROLATE-FORMOTEROL 9-4.8 MCG/ACT IN AERO: 2.0000 | INHALATION_SPRAY | Freq: Two times a day (BID) | RESPIRATORY_TRACT | 5 refills | Status: DC

## 2016-06-17 NOTE — Telephone Encounter (Signed)
Pt requesting bevespi rx to be sent to pharmacy.  This has been sent.  Nothing further needed.

## 2016-06-18 ENCOUNTER — Telehealth: Payer: Self-pay | Admitting: Internal Medicine

## 2016-06-18 NOTE — Telephone Encounter (Signed)
Called and spoke to pt. Informed her of the recs per MW. Pt verbalized understanding and denied any further questions or concerns at this time.

## 2016-06-18 NOTE — Telephone Encounter (Signed)
Called [facility] and spoke with the pharmacist. States that there is an interaction between [place]. The interaction is prolonged QT intervals.  MW - please advise. Thanks.

## 2016-06-18 NOTE — Telephone Encounter (Signed)
This is not a systemic drug but to reduce the risk rec change to one bid dosing and see if any change in symptoms and if so return here for another choice

## 2016-06-23 ENCOUNTER — Emergency Department (HOSPITAL_COMMUNITY): Payer: Medicare Other

## 2016-06-23 ENCOUNTER — Encounter: Payer: Self-pay | Admitting: Internal Medicine

## 2016-06-23 ENCOUNTER — Ambulatory Visit: Payer: Medicare Other | Admitting: Cardiology

## 2016-06-23 ENCOUNTER — Ambulatory Visit (INDEPENDENT_AMBULATORY_CARE_PROVIDER_SITE_OTHER): Payer: Medicare Other | Admitting: Internal Medicine

## 2016-06-23 ENCOUNTER — Other Ambulatory Visit: Payer: Self-pay

## 2016-06-23 ENCOUNTER — Encounter (HOSPITAL_COMMUNITY): Payer: Self-pay | Admitting: *Deleted

## 2016-06-23 ENCOUNTER — Ambulatory Visit (HOSPITAL_COMMUNITY)
Admission: RE | Admit: 2016-06-23 | Discharge: 2016-06-23 | Disposition: A | Discharge status: Home / Self Care | Payer: Medicare Other | Source: Ambulatory Visit | Attending: Internal Medicine | Admitting: Internal Medicine

## 2016-06-23 ENCOUNTER — Inpatient Hospital Stay (HOSPITAL_COMMUNITY)
Admission: EM | Admit: 2016-06-23 | Discharge: 2016-06-26 | DRG: 286 | Disposition: A | Discharge status: Home Health | Payer: Medicare Other | Attending: Internal Medicine | Admitting: Internal Medicine

## 2016-06-23 VITALS — BP 136/82 | HR 131 | Ht 63.0 in | Wt 196.4 lb

## 2016-06-23 DIAGNOSIS — I4819 Other persistent atrial fibrillation: Secondary | ICD-10-CM | POA: Diagnosis present

## 2016-06-23 DIAGNOSIS — R0602 Shortness of breath: Secondary | ICD-10-CM

## 2016-06-23 DIAGNOSIS — R079 Chest pain, unspecified: Secondary | ICD-10-CM | POA: Diagnosis not present

## 2016-06-23 DIAGNOSIS — E785 Hyperlipidemia, unspecified: Secondary | ICD-10-CM | POA: Diagnosis present

## 2016-06-23 DIAGNOSIS — I484 Atypical atrial flutter: Secondary | ICD-10-CM | POA: Diagnosis present

## 2016-06-23 DIAGNOSIS — J449 Chronic obstructive pulmonary disease, unspecified: Secondary | ICD-10-CM | POA: Diagnosis not present

## 2016-06-23 DIAGNOSIS — M62830 Muscle spasm of back: Secondary | ICD-10-CM | POA: Diagnosis not present

## 2016-06-23 DIAGNOSIS — I2510 Atherosclerotic heart disease of native coronary artery without angina pectoris: Secondary | ICD-10-CM | POA: Diagnosis not present

## 2016-06-23 DIAGNOSIS — I5032 Chronic diastolic (congestive) heart failure: Secondary | ICD-10-CM | POA: Diagnosis not present

## 2016-06-23 DIAGNOSIS — E039 Hypothyroidism, unspecified: Secondary | ICD-10-CM | POA: Diagnosis present

## 2016-06-23 DIAGNOSIS — Z8249 Family history of ischemic heart disease and other diseases of the circulatory system: Secondary | ICD-10-CM

## 2016-06-23 DIAGNOSIS — K219 Gastro-esophageal reflux disease without esophagitis: Secondary | ICD-10-CM | POA: Diagnosis present

## 2016-06-23 DIAGNOSIS — R0609 Other forms of dyspnea: Secondary | ICD-10-CM | POA: Diagnosis not present

## 2016-06-23 DIAGNOSIS — J9601 Acute respiratory failure with hypoxia: Secondary | ICD-10-CM | POA: Diagnosis present

## 2016-06-23 DIAGNOSIS — I422 Other hypertrophic cardiomyopathy: Secondary | ICD-10-CM

## 2016-06-23 DIAGNOSIS — Z79899 Other long term (current) drug therapy: Secondary | ICD-10-CM

## 2016-06-23 DIAGNOSIS — R0600 Dyspnea, unspecified: Secondary | ICD-10-CM

## 2016-06-23 DIAGNOSIS — I130 Hypertensive heart and chronic kidney disease with heart failure and stage 1 through stage 4 chronic kidney disease, or unspecified chronic kidney disease: Secondary | ICD-10-CM | POA: Diagnosis present

## 2016-06-23 DIAGNOSIS — I270 Primary pulmonary hypertension: Secondary | ICD-10-CM | POA: Diagnosis not present

## 2016-06-23 DIAGNOSIS — I480 Paroxysmal atrial fibrillation: Secondary | ICD-10-CM | POA: Diagnosis present

## 2016-06-23 DIAGNOSIS — R0902 Hypoxemia: Secondary | ICD-10-CM

## 2016-06-23 DIAGNOSIS — G4733 Obstructive sleep apnea (adult) (pediatric): Secondary | ICD-10-CM | POA: Diagnosis present

## 2016-06-23 DIAGNOSIS — H3530 Unspecified macular degeneration: Secondary | ICD-10-CM | POA: Diagnosis present

## 2016-06-23 DIAGNOSIS — Z888 Allergy status to other drugs, medicaments and biological substances status: Secondary | ICD-10-CM

## 2016-06-23 DIAGNOSIS — I481 Persistent atrial fibrillation: Secondary | ICD-10-CM | POA: Diagnosis not present

## 2016-06-23 DIAGNOSIS — Z87891 Personal history of nicotine dependence: Secondary | ICD-10-CM

## 2016-06-23 DIAGNOSIS — N183 Chronic kidney disease, stage 3 unspecified: Secondary | ICD-10-CM | POA: Diagnosis present

## 2016-06-23 DIAGNOSIS — I252 Old myocardial infarction: Secondary | ICD-10-CM

## 2016-06-23 DIAGNOSIS — R0789 Other chest pain: Secondary | ICD-10-CM | POA: Diagnosis not present

## 2016-06-23 DIAGNOSIS — R072 Precordial pain: Secondary | ICD-10-CM

## 2016-06-23 DIAGNOSIS — Z7901 Long term (current) use of anticoagulants: Secondary | ICD-10-CM | POA: Diagnosis not present

## 2016-06-23 DIAGNOSIS — I10 Essential (primary) hypertension: Secondary | ICD-10-CM | POA: Diagnosis present

## 2016-06-23 LAB — TROPONIN I: TROPONIN I: 0.08 ng/mL — AB (ref <0.03)

## 2016-06-23 LAB — PULMONARY FUNCTION TEST
DL/VA % PRED: 37 %
DL/VA: 1.69 ml/min/mmHg/L
DLCO UNC % PRED: 22 %
DLCO UNC: 4.75 ml/min/mmHg
FEF 25-75 PRE: 1.05 L/s
FEF 25-75 Post: 1.8 L/sec
FEF2575-%Change-Post: 71 %
FEF2575-%PRED-POST: 119 %
FEF2575-%PRED-PRE: 69 %
FEV1-%Change-Post: 12 %
FEV1-%PRED-PRE: 85 %
FEV1-%Pred-Post: 95 %
FEV1-POST: 1.56 L
FEV1-Pre: 1.39 L
FEV1FVC-%Change-Post: 9 %
FEV1FVC-%Pred-Pre: 98 %
FEV6-%CHANGE-POST: 2 %
FEV6-%PRED-POST: 92 %
FEV6-%Pred-Pre: 90 %
FEV6-Post: 1.87 L
FEV6-Pre: 1.83 L
FEV6FVC-%PRED-POST: 104 %
FEV6FVC-%PRED-PRE: 104 %
FVC-%Change-Post: 2 %
FVC-%Pred-Post: 89 %
FVC-%Pred-Pre: 86 %
FVC-POST: 1.88 L
FVC-Pre: 1.83 L
POST FEV1/FVC RATIO: 83 %
POST FEV6/FVC RATIO: 100 %
Pre FEV1/FVC ratio: 76 %
Pre FEV6/FVC Ratio: 100 %
RV % PRED: 79 %
RV: 1.68 L
TLC % PRED: 76 %
TLC: 3.63 L

## 2016-06-23 LAB — CBC
HCT: 41.5 % (ref 36.0–46.0)
HEMOGLOBIN: 13.4 g/dL (ref 12.0–15.0)
MCH: 29.1 pg (ref 26.0–34.0)
MCHC: 32.3 g/dL (ref 30.0–36.0)
MCV: 90 fL (ref 78.0–100.0)
PLATELETS: 280 10*3/uL (ref 150–400)
RBC: 4.61 MIL/uL (ref 3.87–5.11)
RDW: 17 % — ABNORMAL HIGH (ref 11.5–15.5)
WBC: 8 10*3/uL (ref 4.0–10.5)

## 2016-06-23 LAB — BASIC METABOLIC PANEL
ANION GAP: 10 (ref 5–15)
BUN: 16 mg/dL (ref 6–20)
CALCIUM: 9 mg/dL (ref 8.9–10.3)
CO2: 20 mmol/L — ABNORMAL LOW (ref 22–32)
CREATININE: 1.41 mg/dL — AB (ref 0.44–1.00)
Chloride: 111 mmol/L (ref 101–111)
GFR calc non Af Amer: 37 mL/min — ABNORMAL LOW (ref >60)
GFR, EST AFRICAN AMERICAN: 42 mL/min — AB (ref >60)
Glucose, Bld: 93 mg/dL (ref 65–99)
Potassium: 4.1 mmol/L (ref 3.5–5.1)
SODIUM: 141 mmol/L (ref 135–145)

## 2016-06-23 LAB — I-STAT TROPONIN, ED: TROPONIN I, POC: 0.03 ng/mL (ref 0.00–0.08)

## 2016-06-23 LAB — BRAIN NATRIURETIC PEPTIDE: B NATRIURETIC PEPTIDE 5: 424.4 pg/mL — AB (ref 0.0–100.0)

## 2016-06-23 LAB — D-DIMER, QUANTITATIVE: D-Dimer, Quant: <0.27 ug/mL-FEU (ref 0.00–0.50)

## 2016-06-23 MED ORDER — RIVAROXABAN 20 MG PO TABS
20.0000 mg | ORAL_TABLET | Freq: Every day | ORAL | Status: DC
Start: 2016-06-23 — End: 2016-06-24
  Administered 2016-06-23: 20 mg via ORAL
  Filled 2016-06-23: qty 1

## 2016-06-23 MED ORDER — ALBUTEROL SULFATE (2.5 MG/3ML) 0.083% IN NEBU
2.5000 mg | INHALATION_SOLUTION | Freq: Once | RESPIRATORY_TRACT | Status: AC
  Administered 2016-06-23: 2.5 mg via RESPIRATORY_TRACT

## 2016-06-23 MED ORDER — NITROGLYCERIN 0.4 MG SL SUBL: 0.4000 mg | SUBLINGUAL_TABLET | SUBLINGUAL | Status: DC | PRN

## 2016-06-23 MED ORDER — MORPHINE SULFATE (PF) 4 MG/ML IV SOLN: 2.0000 mg | INTRAVENOUS | Status: DC | PRN

## 2016-06-23 MED ORDER — ACETAMINOPHEN 325 MG PO TABS
650.0000 mg | ORAL_TABLET | ORAL | Status: DC | PRN
  Administered 2016-06-25: 650 mg via ORAL
  Filled 2016-06-23 (×2): qty 2

## 2016-06-23 MED ORDER — ONDANSETRON HCL 4 MG/2ML IJ SOLN: 4.0000 mg | Freq: Four times a day (QID) | INTRAMUSCULAR | Status: DC | PRN

## 2016-06-23 MED ORDER — METOPROLOL TARTRATE 12.5 MG HALF TABLET
12.5000 mg | ORAL_TABLET | Freq: Two times a day (BID) | ORAL | Status: DC
  Administered 2016-06-23: 12.5 mg via ORAL
  Filled 2016-06-23 (×3): qty 1

## 2016-06-23 MED ORDER — DRONEDARONE HCL 400 MG PO TABS
400.0000 mg | ORAL_TABLET | Freq: Two times a day (BID) | ORAL | Status: DC
  Administered 2016-06-23 – 2016-06-25 (×4): 400 mg via ORAL
  Filled 2016-06-23 (×9): qty 1

## 2016-06-23 MED ORDER — EZETIMIBE 10 MG PO TABS
10.0000 mg | ORAL_TABLET | Freq: Every day | ORAL | Status: DC
  Administered 2016-06-23 – 2016-06-26 (×4): 10 mg via ORAL
  Filled 2016-06-23 (×4): qty 1

## 2016-06-23 MED ORDER — ASPIRIN EC 325 MG PO TBEC
325.0000 mg | DELAYED_RELEASE_TABLET | Freq: Once | ORAL | Status: AC
  Administered 2016-06-23: 325 mg via ORAL
  Filled 2016-06-23: qty 1

## 2016-06-23 MED ORDER — PANTOPRAZOLE SODIUM 40 MG PO TBEC
40.0000 mg | DELAYED_RELEASE_TABLET | Freq: Every day | ORAL | Status: DC
  Administered 2016-06-24 – 2016-06-26 (×3): 40 mg via ORAL
  Filled 2016-06-23 (×3): qty 1

## 2016-06-23 MED ORDER — ATORVASTATIN CALCIUM 80 MG PO TABS
80.0000 mg | ORAL_TABLET | Freq: Every day | ORAL | Status: DC
  Administered 2016-06-24 – 2016-06-26 (×3): 80 mg via ORAL
  Filled 2016-06-23 (×3): qty 1

## 2016-06-23 MED ORDER — LEVALBUTEROL HCL 0.63 MG/3ML IN NEBU: 0.6300 mg | INHALATION_SOLUTION | RESPIRATORY_TRACT | Status: DC | PRN

## 2016-06-23 MED ORDER — FUROSEMIDE 20 MG PO TABS: 20.0000 mg | ORAL_TABLET | Freq: Once | ORAL | Status: DC

## 2016-06-23 MED ORDER — ZOLPIDEM TARTRATE 5 MG PO TABS
2.5000 mg | ORAL_TABLET | Freq: Every evening | ORAL | Status: DC | PRN
  Administered 2016-06-23 – 2016-06-26 (×3): 2.5 mg via ORAL
  Filled 2016-06-23 (×3): qty 1

## 2016-06-23 MED ORDER — LEVOTHYROXINE SODIUM 100 MCG PO TABS
100.0000 ug | ORAL_TABLET | Freq: Every day | ORAL | Status: DC
  Administered 2016-06-24 – 2016-06-26 (×3): 100 ug via ORAL
  Filled 2016-06-23 (×4): qty 1

## 2016-06-23 MED ORDER — FUROSEMIDE 20 MG PO TABS
20.0000 mg | ORAL_TABLET | Freq: Every day | ORAL | Status: DC
  Administered 2016-06-24 – 2016-06-26 (×3): 20 mg via ORAL
  Filled 2016-06-23 (×4): qty 1

## 2016-06-23 NOTE — ED Notes (Signed)
Patient transported to X-ray 

## 2016-06-23 NOTE — ED Notes (Signed)
Pt returned from xray and connected to the monitor

## 2016-06-23 NOTE — Consult Note (Signed)
Primary Physician:  Rankins Primary Cardiologist:  Turner/Klein   HPI: Pt is a 71 yo with history of  CAD (Cath 2013 80% small D1 otherwise nonobstructive), apical hypertrophic CM with diastolic dysfunction, atrial fibrillation, HTN, HL and COPD  She is followed by Olin Pia in clinic  Seen in Dec 2017   PAP by echo  Was 72   Went for PFTs at 11 am  Had not eaten Only tood synthroid and 1/2 lopressor  During test it was hard but through [employer] to [employer] fror lunch  Ate OK  Then didn't feel feel good  Soup, sandwich and salad  Got thourgh part  Then started feeling not self  Tight in chest  Not pleuritic  Pressure HR was jumping HR was in 113  DId nt have lopressor   Went to car  Sat  Got worse  Vomited  Still not right   Wenjt top Dr Morrison Old  Sats in 80s  Chest still a little tight Today pt was seen by Selinda Orion Pt complained of pressure on chest and then vomiting after lunch  She was tachycardiac after SABA given  N/V then tightness.    Prior to today breathing was about the same  Still has SOB No chst tightness Says she doesn't take Lasix every day  If going out doesn't take         Past Medical History:  Diagnosis Date  . Apical variant hypertrophic cardiomyopathy ([place])    Diagnosed by echo and ECG 11/13  . Atypical atrial flutter ([place])   . CAD (coronary artery disease)    a. LHC in 10/2011 demonstrated non-obstructive disease and an 80% lesion in a small D1 treated medically.   . Chronic diastolic heart failure ([place])   . CKD (chronic kidney disease), stage III   . COPD (chronic obstructive pulmonary disease) ([place])   . Dizziness    had PT for being off balance - in approx. 2012  . GERD (gastroesophageal reflux disease)   . Hyperlipidemia   . Hypertension   . Hypothyroid   . Lumbar disc disease   . Macular degeneration   . OSA (obstructive sleep apnea)   . Persistent atrial fibrillation ([place])       . Pulmonary hypertension    a. RHC 05/2013 showed mild PAH with normal PCWP and RA  pressure, could be related to OSA and low oxygen saturation.   . Refusal of blood transfusions as patient is Jehovah's Witness   . Sinus bradycardia      (Not in a hospital admission)     Infusions:   Allergies  Allergen Reactions  . Amiodarone     Dyspnea - felt to be 2/2 amio lung toxicity Has tolerated Omnipaque   . Zyban [Bupropion] Itching    Social History   Social History  . Marital status: Widowed    Spouse name: divorced.  . Number of children: 1  . Years of education: N/A   Occupational History  . retired. still works for [employer].     Social History Main Topics  . Smoking status: Former Smoker    Packs/day: 0.50    Years: 44.00    Types: Cigarettes    Quit date: 04/07/2009  . Smokeless tobacco: Never Used  . Alcohol use Yes     Comment: 2 to 3 glasses wine per week  . Drug use: No  . Sexual activity: No   Other Topics Concern  . Not on file  Social History Narrative   Pt lives alone with 2 dogs.     Family History  Problem Relation Age of Onset  . Heart disease Sister     Good Pastures Syndrome  . Gout Father   . Lung cancer Father     smoked  . Breast cancer Paternal Aunt     REVIEW OF SYSTEMS:  All systems reviewed  Negative to the above problem except as noted above.    PHYSICAL EXAM: Vitals:   06/23/16 1630 06/23/16 1700  BP: 128/73 (!) 142/85  Pulse: (!) 58 (!) 55  Resp: (!) 21 (!) 25  Temp:      No intake or output data in the 24 hours ending 06/23/16 1737  General:  Well appearing. In NAD  Sats range for 100% (2L) to 85%  HEENT: normal Neck: supple  Full  . no JVD. Carotids 2+ bilat; no bruits. No lymphadenopathy or thryomegaly appreciated. Cor: PMI nondisplaced. Regular rate & rhythm. No rubs, gallops or murmurs. Lungs: clear  Moving air   Abdomen: soft, nontender, nondistended. No hepatosplenomegaly. No bruits or masses. Good bowel sounds. Extremities: no cyanosis, clubbing, rash, edema Neuro: alert & oriented x 3,  cranial nerves grossly intact. moves all 4 extremities w/o difficulty. Affect pleasant.  ECG:  SR 74 bpm  LVH with repol abnormality    Results for orders placed or performed during the hospital encounter of 06/23/16 (from the past 24 hour(s))  Basic metabolic panel     Status: Abnormal   Collection Time: 06/23/16  3:10 PM  Result Value Ref Range   Sodium 141 135 - 145 mmol/L   Potassium 4.1 3.5 - 5.1 mmol/L   Chloride 111 101 - 111 mmol/L   CO2 20 (L) 22 - 32 mmol/L   Glucose, Bld 93 65 - 99 mg/dL   BUN 16 6 - 20 mg/dL   Creatinine, Ser 1.41 (H) 0.44 - 1.00 mg/dL   Calcium 9.0 8.9 - 10.3 mg/dL   GFR calc non Af Amer 37 (L) >60 mL/min   GFR calc Af Amer 42 (L) >60 mL/min   Anion gap 10 5 - 15  CBC     Status: Abnormal   Collection Time: 06/23/16  3:10 PM  Result Value Ref Range   WBC 8.0 4.0 - 10.5 K/uL   RBC 4.61 3.87 - 5.11 MIL/uL   Hemoglobin 13.4 12.0 - 15.0 g/dL   HCT 41.5 36.0 - 46.0 %   MCV 90.0 78.0 - 100.0 fL   MCH 29.1 26.0 - 34.0 pg   MCHC 32.3 30.0 - 36.0 g/dL   RDW 17.0 (H) 11.5 - 15.5 %   Platelets 280 150 - 400 K/uL  D-dimer, quantitative (not at [facility])     Status: None   Collection Time: 06/23/16  3:10 PM  Result Value Ref Range   D-Dimer, Quant <0.27 0.00 - 0.50 ug/mL-FEU  Brain natriuretic peptide     Status: Abnormal   Collection Time: 06/23/16  3:10 PM  Result Value Ref Range   B Natriuretic Peptide 424.4 (H) 0.0 - 100.0 pg/mL  I-stat troponin, ED     Status: None   Collection Time: 06/23/16  3:44 PM  Result Value Ref Range   Troponin i, poc 0.03 0.00 - 0.08 ng/mL   Comment 3           Dg Chest 2 View  Result Date: 06/23/2016 CLINICAL DATA:  Chest pain and shortness of breath, acute onset. EXAM: CHEST  2 VIEW COMPARISON:  05/26/2016. FINDINGS: Trachea is midline. Heart is enlarged. Thoracic aorta is calcified. Linear scarring in the lingula. Lungs are otherwise clear. No pleural fluid. IMPRESSION: No acute findings. Electronically Signed   By: Lorin Picket M.D.   On: 06/23/2016 15:56     ASSESSMENT:  Pt is a 71 yo with history of apical HCM, CAD, diastolic dysfunction  Developed chest tightness after PFTs today  Also some N/V  Pt noted HR increased after testing   On exam O2 sat is labile  Goes down to 80s with talking    Moving air  No edema  Labs signif for BNP elevated at 484.  CXR unremarkable EKG with LVH with repol abnormality (nondiagnostic)   Symptoms may be from some mild volume increase in setting of COPD Would r/o for MI though Check D Dimer Get echo to reeval LVEF and RVEF /Pulmonary pressures Give lasix IV x 1  Watch output and Renal function.   2  CAD  As above  3  HTN  Follow   4  Hx Atrial fib  I would continue Xarelto and Multaq  5  COPD  Continue O2  Document needs as diurese

## 2016-06-23 NOTE — ED Triage Notes (Signed)
Patient presents to ed c/o chest pressure states she had a pulmonary function sturdy done at [facility] today went to eat lunch afterwards, and states after eating she developed chest pressure, states she vomited  And after vomiting states she felt a little better. Presently rates chest pain as 3/10

## 2016-06-23 NOTE — ED Notes (Addendum)
Pt's dinner tray has arrived

## 2016-06-23 NOTE — ED Notes (Signed)
Attempted report x1

## 2016-06-23 NOTE — ED Notes (Signed)
Pt on 2L [place] at this time. This Rn turned down the o2 to see what pts room air saturation was. Pts fingers are very cold so a good reading could not be obtained. Pt reading 93% on room air and up to 100% on 2L. Pt now on 2L at this time and sating at 95%.

## 2016-06-23 NOTE — Assessment & Plan Note (Signed)
-  PFT's 05/2013:  FEV1 1.41 (82%), ratio 77 (ratio 69 if use fev1/vc)  Classic copd curvature/  DLCO 8.85 (41%) corrects to 60% for alv vol  - 05/26/2016  After extensive coaching HFA effectiveness =    75% > bevespi trial  - PFT's  06/23/2016  FEV1 1.56 (95 % ) ratio 83  p 12 % improvement from saba p nothing rior to study with DLCO  22 % corrects to 37  % for alv volume   Probably best treated with prn xopenex since appears to be very sensitive to LABA

## 2016-06-23 NOTE — ED Notes (Signed)
ED Provider at bedside. 

## 2016-06-23 NOTE — H&P (Signed)
History and Physical    ARANTXA PIERCEY JYN:[id number] DOB: 1946-03-03 DOA: 06/23/2016  PCP: Aretta Nip, MD  Cardiology: Radford Pax Pulmonary: [place]  Patient coming from: Dr. Gustavus Bryant office via EMS  Chief Complaint: Substernal chest pain  HPI: Amy [name] is a 71 y.o. 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  She was due to see Dr. Melvyn Novas in clinic this afternoon.  After her testing, she went to [place] for lunch with her daughter.  While there, she began to notice that she was not feeling well. She developed substernal chest pressure, nonradiating, associated with shortness of breath.  She vomited in the parking lot at [place], which made her feel a bit better.  The intensity of the chest pressure improved.  She actually drove herself to Dr. Gustavus Bryant office while still having active chest pain.  She reported her symptoms upon arrival.  EMS was called for emergent transfer to the ED.  She received 1 SL NTG en route.  Chest pain resolved after about three hours.  However, patient found to have low O2 saturations on RA in the ED (86-87%), which is new.  No active wheezing.  She has had some light-headedness but no syncope.  She has had a recent dry cough.  No congestion.  No fever.  Isolated abdominal pain on Sunday night, which she related to an orange juice and EtOH cocktail.  No diarrhea.  She has intermittent ankle edema.  ED Course: The patient was evaluated by cardiology in the ED.  Troponin 0.03.  EKG shows NSR, nonspecific T wave inversions, probable LVH.  BNP 424.  Chest xray negative for acute process.  Hospitalist asked to place in observation.  Review of Systems: As per HPI  otherwise 10 systems reviewed and negative.   Past Medical History:  Diagnosis Date  . Apical variant hypertrophic cardiomyopathy ([place])    Diagnosed by echo and ECG 11/13  . Atypical atrial flutter ([place])   . CAD (coronary artery disease)    a. LHC in 10/2011 demonstrated non-obstructive disease and an 80% lesion in a small D1 treated medically.   . Chronic diastolic heart failure ([place])   . CKD (chronic kidney disease), stage III   . COPD (chronic obstructive pulmonary disease) ([place])   . Dizziness    had PT for being off balance - in approx. 2012  . GERD (gastroesophageal reflux disease)   . Hyperlipidemia   . Hypertension   . Hypothyroid   . Lumbar disc disease   . Macular degeneration   . OSA (obstructive sleep apnea)   . Persistent atrial fibrillation ([place])       . Pulmonary hypertension    a. RHC 05/2013 showed mild PAH with normal PCWP and RA pressure, could be related to OSA and low oxygen saturation.   . Refusal of blood transfusions as patient is Jehovah's Witness   . Sinus bradycardia     Past Surgical History:  Procedure Laterality Date  . CARDIAC CATHETERIZATION     normal coronary arteries  . CARDIAC CATHETERIZATION N/A 10/31/2014   Procedure: Left Heart Cath and Coronary Angiography;  Surgeon: Leonie Man, MD;  Location: [facility];  Service: Cardiovascular;  Laterality: N/A;  . CATARACT  EXTRACTION Bilateral   . CESAREAN SECTION  1983  . ELECTROPHYSIOLOGIC STUDY N/A 04/24/2015   Procedure: Atrial Fibrillation Ablation;  Surgeon: [name], MD;  Location: [facility];  Service: Cardiovascular;  Laterality: N/A;  . RIGHT HEART CATHETERIZATION N/A 06/01/2013   Procedure: RIGHT HEART CATH;  Surgeon: Larey Dresser, MD;  Location: [facility] CATH LAB;  Service: Cardiovascular;  Laterality: N/A;  . TEE WITHOUT CARDIOVERSION N/A 04/23/2015   Procedure: TRANSESOPHAGEAL ECHOCARDIOGRAM (TEE);  Surgeon: Josue Hector, MD;  Location: [facility] ENDOSCOPY;  Service:  Cardiovascular;  Laterality: N/A;     reports that she quit smoking about 7 years ago. Her smoking use included Cigarettes. She has a 22.00 pack-year smoking history. She has never used smokeless tobacco. She reports that she drinks alcohol. She reports that she does not use drugs. Occasional, social EtOH use. She is a Sales promotion account executive witness.  Allergies  Allergen Reactions  . Amiodarone     Dyspnea - felt to be 2/2 amio lung toxicity Has tolerated Omnipaque   . Zyban [Bupropion] Itching    Family History  Problem Relation Age of Onset  . Heart disease Sister     Good Pastures Syndrome  . Gout Father   . Lung cancer Father     smoked  . Breast cancer Paternal Aunt      Prior to Admission medications   Medication Sig Start Date End Date Taking? Authorizing Provider  albuterol (PROVENTIL HFA;VENTOLIN HFA) 108 (90 BASE) MCG/ACT inhaler Inhale 2 puffs into the lungs every 6 (six) hours as needed for wheezing or shortness of breath.    Yes Historical Provider, MD  atorvastatin (LIPITOR) 80 MG tablet Take 1 tablet (80 mg total) by mouth daily with lunch. 02/08/15  Yes Sherran Needs, NP  Cholecalciferol (VITAMIN D) 2000 UNITS CAPS Take 2,000 Units by mouth daily.    Yes Historical Provider, MD  colchicine 0.6 MG tablet Take 0.6 mg by mouth daily.    Yes Historical Provider, MD  ezetimibe (ZETIA) 10 MG tablet TAKE 1 TABLET (10 MG TOTAL) BY MOUTH DAILY. 03/03/16  Yes Sueanne Margarita, MD  furosemide (LASIX) 20 MG tablet Take 1 tablet (20 mg total) by mouth 2 (two) times daily. Patient taking differently: Take 20 mg by mouth daily.  10/04/15  Yes Eileen Stanford, PA-C  Glycopyrrolate-Formoterol (BEVESPI AEROSPHERE) 9-4.8 MCG/ACT AERO Inhale 2 puffs into the lungs 2 (two) times daily. Patient taking differently: Inhale 1 puff into the lungs 2 (two) times daily.  06/17/16  Yes Tanda Rockers, MD  levothyroxine (SYNTHROID, LEVOTHROID) 100 MCG tablet Take 100 mcg by mouth daily before breakfast.    Yes Historical Provider, MD  metoprolol tartrate (LOPRESSOR) 25 MG tablet Take 0.5 tablets (12.5 mg total) by mouth 2 (two) times daily. 10/04/15  Yes Eileen Stanford, PA-C  MULTAQ 400 MG tablet TAKE 1 TABLET BY MOUTH TWICE A DAY WITH A MEAL 01/24/16  Yes Sherran Needs, NP  Multiple Vitamin (MULTIVITAMIN) capsule Take 1 capsule by mouth daily.   Yes Historical Provider, MD  nitroGLYCERIN (NITROSTAT) 0.4 MG SL tablet Place 1 tablet (0.4 mg total) under the tongue every 5 (five) minutes as needed for chest pain. 07/19/14  Yes Sueanne Margarita, MD  pantoprazole (PROTONIX) 40 MG tablet TAKE 1 TABLET (40 MG TOTAL) BY MOUTH DAILY. 01/25/16  Yes [name], MD  rivaroxaban (XARELTO) 20 MG TABS tablet TAKE 1 TABLET BY MOUTH EVERY DAY 10/04/15  Yes Eileen Stanford, PA-C  UNABLE TO FIND Med Name: CPAP  [facility]   Yes Historical Provider, MD  zolpidem (AMBIEN) 5 MG tablet Take 2.5 mg by mouth at bedtime as needed for sleep.   Yes Historical Provider, MD    Physical Exam: Vitals:   06/23/16 1530 06/23/16 1538 06/23/16 1630 06/23/16 1700  BP: 136/83  128/73 (!) 142/85  Pulse: (!) 59  (!) 58 (!) 55  Resp: 15  (!) 21 (!) 25  Temp:      SpO2: (!) 86%  94% 94%  Weight:  88.9 kg (196 lb)    Height:  _0  (1.6 m)        Constitutional: NAD, calm, comfortable, somewhat ill appearing Vitals:   06/23/16 1530 06/23/16 1538 06/23/16 1630 06/23/16 1700  BP: 136/83  128/73 (!) 142/85  Pulse: (!) 59  (!) 58 (!) 55  Resp: 15  (!) 21 (!) 25  Temp:      SpO2: (!) 86%  94% 94%  Weight:  88.9 kg (196 lb)    Height:  _1  (1.6 m)     Eyes: pupils are small but equal, sluggish, lids and conjunctivae normal ENMT: Mucous membranes are moist. Posterior pharynx clear of any exudate or lesions. Normal dentition.  Neck: normal appearance, supple, no masses Respiratory: clear to auscultation bilaterally, no wheezing, no crackles. Normal respiratory effort. No accessory muscle use.  Cardiovascular: Normal rate,  regular rhythm, no murmurs / rubs / gallops. No extremity edema. 2+ pedal pulses. No carotid bruits.  GI: abdomen is soft and compressible.  No distention.  Bowel sounds are present. Musculoskeletal:  No joint deformity in upper and lower extremities. Good ROM, no contractures. Normal muscle tone.  Skin: no rashes, warm and dry Psychiatric: Normal judgment and insight. Alert and oriented x 3. Normal mood.     Labs on Admission: I have personally reviewed following labs and imaging studies  CBC:  Recent Labs Lab 06/23/16 1510  WBC 8.0  HGB 13.4  HCT 41.5  MCV 90.0  PLT 335   Basic Metabolic Panel:  Recent Labs Lab 06/23/16 1510  NA 141  K 4.1  CL 111  CO2 20*  GLUCOSE 93  BUN 16  CREATININE 1.41*  CALCIUM 9.0   GFR: Estimated Creatinine Clearance: 38.7 mL/min (A) (by C-G formula based on SCr of 1.41 mg/dL (H)).  Troponin 0.03  Radiological Exams on Admission: Dg Chest 2 View  Result Date: 06/23/2016 CLINICAL DATA:  Chest pain and shortness of breath, acute onset. EXAM: CHEST  2 VIEW COMPARISON:  05/26/2016. FINDINGS: Trachea is midline. Heart is enlarged. Thoracic aorta is calcified. Linear scarring in the lingula. Lungs are otherwise clear. No pleural fluid. IMPRESSION: No acute findings. Electronically Signed   By: Lorin Picket M.D.   On: 06/23/2016 15:56    EKG: Independently reviewed by me.  Noted above.  Assessment/Plan Principal Problem:   Chest pain Active Problems:   Hypothyroidism   Essential hypertension   Apical variant hypertrophic cardiomyopathy (HCC)   Chronic diastolic heart failure (HCC)   Paroxysmal atrial fibrillation/flutter   OSA (obstructive sleep apnea)   Hypoxia   Pulmonary hypertension   Chronic anticoagulation   Chronic renal insufficiency, stage III (moderate)      Chest pain in a patient with known CAD, prior NSTEMI, multiple risk factors. --Cardiology consultation appreciated --Will give full strength aspirin x one  while ruling out ACS --Serial troponin --Echo in the AM --Consider trial of IV diuretic --Continue beta blocker and statin  Atrial fibrillation --Multaq, lopressor, Xarelto  Acute hypoxia, etiology unclear.  Normal D-Dimer.  Does not appear to be in fulminant CHF.  Pulmonary status appears stable. --Wean supplemental oxygen as tolerated; may need to be screened for home O2 --Continue CPAP qHS for OSA --Incentive spirometry  History of diastolic CHF --Continue home dose of laisx for now  COPD --Xopenex nebs prn --HOLD long acting inhaler while in observation status; can order if it appears patient will have prolonged hospitalization  Hypothyroidism --Continue home dose of levothyroxine  CKD --Appears to be at baseline        DVT prophylaxis: Anticoagulated with Xarelto Code Status: FULL Family Communication: Daughter present in the ED at time of admission. Disposition Plan: To be determined. Consults called: Cardiology Admission status: Place in observation with telemetry monitoring.   TIME SPENT: 50 minutes   Eber Jones MD [facility] Pager [phone]  If 7PM-7AM, please contact night-coverage [email or web address] Password [email or web address]  06/23/2016, 6:50 PM

## 2016-06-23 NOTE — Patient Instructions (Signed)
Transport to [facility] ER by ambulance for possible unstable angina/ new hypoxemia rec by Dr Fransico Him

## 2016-06-23 NOTE — ED Notes (Signed)
Admitting at bedside 

## 2016-06-23 NOTE — Assessment & Plan Note (Addendum)
PFT"s 12/2010:  FEV1 1.83 (89%), ratio 75, TLC 3.93 (84%), DLCO 10.6 (46%) Cath 10/2011: CAD, increased LVEDP c/w DD  Echo 04/2013: nl EF, mild MR, mod LA dilatation, peak pa pressure 43m estimated PFT's 05/2013:  FEV1 1.41 (82%), ratio 77, minimal airtrapping, TLC 3.28 (68%), DLCO 8.85 (41%) CT chest 04/2013: small focus of ASDZ deep in right base (pna), GGO noted in LUL of ?etiology Pt on amiodarone for greater than one year >> ESR 61 05/12/2013 > dc'd  - 05/26/2016   Walked RA  2 laps @ 185 ft each stopped due to  Sob no desats - presented 06/23/2016 with chest pressure n and V p albuterol in PFT labs with new resting hypoxemia > sent to ER   I had an extended discussion with the patient reviewing all relevant studies completed to date and  lasting 15 to 20 minutes of a 25 minute acute  visit    Each maintenance medication was reviewed in detail including most importantly the difference between maintenance and prns and under what circumstances the prns are to be triggered using an action plan format that is not reflected in the computer generated alphabetically organized AVS.    Please see AVS for specific instructions unique to this visit that I personally wrote and verbalized to the the pt in detail and then reviewed with pt  by my nurse highlighting any  changes in therapy recommended at today's visit to their plan of care.

## 2016-06-23 NOTE — ED Provider Notes (Addendum)
[facility] DEPT Provider Note   CSN: [id number] Arrival date & time: 06/23/16  1501     History   Chief Complaint Chief Complaint  Patient presents with  . Chest Pain    HPI Amy [name] is a 71 y.o. female.  Patient had follow-up with pulmonary medicine today for pulmonary function test. Patient has a history of COPD. Patient with a history of a non-STEMI in 2016 cardiac cath that time without significant disease. Patient also has a history of atrial for relation or flutter with rapid ventricular response. An apical variant hypertrophic cardiomyopathy. Followed by [facility] Hurter from cardiology. Patient following the pulmonary function tests had onset of chest pain substernal pressure in nature 12:30 last a total 1530. Associated with one episode of nausea and vomiting. No diaphoresis. Associated with some shortness of breath. EMS gave her nitroglycerin 1 without any real change. Patient not normally on oxygen but she's had an oxygen requirement here. Oxygen sats drop into the 80s on room air. Patient's primary care doctor is [facility].      Past Medical History:  Diagnosis Date  . Apical variant hypertrophic cardiomyopathy ([place])    Diagnosed by echo and ECG 11/13  . Atypical atrial flutter ([place])   . CAD (coronary artery disease)    a. LHC in 10/2011 demonstrated non-obstructive disease and an 80% lesion in a small D1 treated medically.   . Chronic diastolic heart failure ([place])   . CKD (chronic kidney disease), stage III   . COPD (chronic obstructive pulmonary disease) ([place])   . Dizziness    had PT for being off balance - in approx. 2012  . GERD (gastroesophageal reflux disease)   . Hyperlipidemia   . Hypertension   . Hypothyroid   . Lumbar disc disease   . Macular degeneration   . OSA (obstructive sleep apnea)   . Persistent atrial fibrillation ([place])       . Pulmonary hypertension    a. RHC 05/2013 showed mild PAH with normal PCWP and RA pressure, could  be related to OSA and low oxygen saturation.   . Refusal of blood transfusions as patient is Jehovah's Witness   . Sinus bradycardia     Patient Active Problem List   Diagnosis Date Noted  . A-fib ([place]) 04/24/2015  . Atrial flutter with rapid ventricular response ([place])   . Atrial fibrillation with rapid ventricular response ([place])   . Elevated troponin   . NSTEMI (non-ST elevated myocardial infarction) ([place]) 10/29/2014  . Chest pain 10/28/2014  . Atrial flutter with RVR 10/28/2014  . Morbid obesity due to excess calories ([place]) 07/19/2014  . Chronic anticoagulation 07/19/2014  . CAD (coronary artery disease) 07/19/2014  . Chronic renal insufficiency, stage III (moderate) 07/19/2014  . Dyslipidemia 04/12/2014  . Pulmonary hypertension 05/12/2013  . OSA (obstructive sleep apnea) 04/13/2013  . Apical variant hypertrophic cardiomyopathy ([place]) 03/05/2012  . Chronic diastolic heart failure ([place])   . Paroxysmal atrial fibrillation/flutter   . Sinus bradycardia   . Asthmatic bronchitis , chronic ([place]) 03/04/2012  . Hypothyroidism 03/04/2012  . Essential hypertension 03/04/2012  . DOE (dyspnea on exertion) 12/24/2010    Past Surgical History:  Procedure Laterality Date  . CARDIAC CATHETERIZATION     normal coronary arteries  . CARDIAC CATHETERIZATION N/A 10/31/2014   Procedure: Left Heart Cath and Coronary Angiography;  Surgeon: Leonie Man, MD;  Location: [facility] LAB;  Service: Cardiovascular;  Laterality: N/A;  . CATARACT EXTRACTION Bilateral   .  CESAREAN SECTION  1983  . ELECTROPHYSIOLOGIC STUDY N/A 04/24/2015   Procedure: Atrial Fibrillation Ablation;  Surgeon: [name], MD;  Location: [facility];  Service: Cardiovascular;  Laterality: N/A;  . RIGHT HEART CATHETERIZATION N/A 06/01/2013   Procedure: RIGHT HEART CATH;  Surgeon: Larey Dresser, MD;  Location: [facility] CATH LAB;  Service: Cardiovascular;  Laterality: N/A;  . TEE WITHOUT CARDIOVERSION N/A 04/23/2015    Procedure: TRANSESOPHAGEAL ECHOCARDIOGRAM (TEE);  Surgeon: Josue Hector, MD;  Location: [facility] ENDOSCOPY;  Service: Cardiovascular;  Laterality: N/A;    OB History    No data available       Home Medications    Prior to Admission medications   Medication Sig Start Date End Date Taking? Authorizing Provider  albuterol (PROVENTIL HFA;VENTOLIN HFA) 108 (90 BASE) MCG/ACT inhaler Inhale 2 puffs into the lungs every 6 (six) hours as needed for wheezing or shortness of breath.    Yes Historical Provider, MD  atorvastatin (LIPITOR) 80 MG tablet Take 1 tablet (80 mg total) by mouth daily with lunch. 02/08/15  Yes Sherran Needs, NP  Cholecalciferol (VITAMIN D) 2000 UNITS CAPS Take 2,000 Units by mouth daily.    Yes Historical Provider, MD  colchicine 0.6 MG tablet Take 0.6 mg by mouth daily.    Yes Historical Provider, MD  ezetimibe (ZETIA) 10 MG tablet TAKE 1 TABLET (10 MG TOTAL) BY MOUTH DAILY. 03/03/16  Yes Sueanne Margarita, MD  furosemide (LASIX) 20 MG tablet Take 1 tablet (20 mg total) by mouth 2 (two) times daily. Patient taking differently: Take 20 mg by mouth daily.  10/04/15  Yes Eileen Stanford, PA-C  Glycopyrrolate-Formoterol (BEVESPI AEROSPHERE) 9-4.8 MCG/ACT AERO Inhale 2 puffs into the lungs 2 (two) times daily. Patient taking differently: Inhale 1 puff into the lungs 2 (two) times daily.  06/17/16  Yes Tanda Rockers, MD  levothyroxine (SYNTHROID, LEVOTHROID) 100 MCG tablet Take 100 mcg by mouth daily before breakfast.   Yes Historical Provider, MD  metoprolol tartrate (LOPRESSOR) 25 MG tablet Take 0.5 tablets (12.5 mg total) by mouth 2 (two) times daily. 10/04/15  Yes Eileen Stanford, PA-C  MULTAQ 400 MG tablet TAKE 1 TABLET BY MOUTH TWICE A DAY WITH A MEAL 01/24/16  Yes Sherran Needs, NP  Multiple Vitamin (MULTIVITAMIN) capsule Take 1 capsule by mouth daily.   Yes Historical Provider, MD  nitroGLYCERIN (NITROSTAT) 0.4 MG SL tablet Place 1 tablet (0.4 mg total) under the tongue  every 5 (five) minutes as needed for chest pain. 07/19/14  Yes Sueanne Margarita, MD  pantoprazole (PROTONIX) 40 MG tablet TAKE 1 TABLET (40 MG TOTAL) BY MOUTH DAILY. 01/25/16  Yes [name], MD  rivaroxaban (XARELTO) 20 MG TABS tablet TAKE 1 TABLET BY MOUTH EVERY DAY 10/04/15  Yes Eileen Stanford, PA-C  UNABLE TO FIND Med Name: CPAP  DME-[facility]   Yes Historical Provider, MD  zolpidem (AMBIEN) 5 MG tablet Take 2.5 mg by mouth at bedtime as needed for sleep.   Yes Historical Provider, MD    Family History Family History  Problem Relation Age of Onset  . Heart disease Sister     Good Pastures Syndrome  . Gout Father   . Lung cancer Father     smoked  . Breast cancer Paternal Aunt     Social History Social History  Substance Use Topics  . Smoking status: Former Smoker    Packs/day: 0.50    Years: 44.00    Types:  Cigarettes    Quit date: 04/07/2009  . Smokeless tobacco: Never Used  . Alcohol use Yes     Comment: 2 to 3 glasses wine per week     Allergies   Amiodarone and Zyban [bupropion]   Review of Systems Review of Systems  Constitutional: Negative for fever.  HENT: Negative for congestion.   Eyes: Negative for redness.  Respiratory: Positive for shortness of breath.   Cardiovascular: Positive for chest pain.  Gastrointestinal: Positive for nausea and vomiting. Negative for abdominal pain.  Genitourinary: Negative for dysuria.  Musculoskeletal: Negative for back pain.  Skin: Negative for rash.  Neurological: Negative for headaches.  Hematological: Does not bruise/bleed easily.  Psychiatric/Behavioral: Negative for confusion.     Physical Exam Updated Vital Signs BP (!) 142/85   Pulse (!) 55   Temp 98.6 F (37 C)   Resp (!) 25   Ht 5' 3" (1.6 m)   Wt 88.9 kg   SpO2 94%   BMI 34.72 kg/m   Physical Exam  Constitutional: She is oriented to person, place, and time. She appears well-developed and well-nourished. No distress.  HENT:  Head: Normocephalic and  atraumatic.  Mouth/Throat: Oropharynx is clear and moist.  Eyes: EOM are normal. Pupils are equal, round, and reactive to light.  Neck: Normal range of motion. Neck supple.  Cardiovascular: Normal rate and regular rhythm.   Pulmonary/Chest: Effort normal and breath sounds normal. No respiratory distress. She has no wheezes. She has no rales.  Abdominal: Soft. Bowel sounds are normal. There is no tenderness.  Musculoskeletal: She exhibits no edema.  Neurological: She is alert and oriented to person, place, and time.  Skin: Skin is warm.  Nursing note and vitals reviewed.    ED Treatments / Results  Labs (all labs ordered are listed, but only abnormal results are displayed) Labs Reviewed  BASIC METABOLIC PANEL - Abnormal; Notable for the following:       Result Value   CO2 20 (*)    Creatinine, Ser 1.41 (*)    GFR calc non Af Amer 37 (*)    GFR calc Af Amer 42 (*)    All other components within normal limits  CBC - Abnormal; Notable for the following:    RDW 17.0 (*)    All other components within normal limits  BRAIN NATRIURETIC PEPTIDE - Abnormal; Notable for the following:    B Natriuretic Peptide 424.4 (*)    All other components within normal limits  D-DIMER, QUANTITATIVE (NOT AT [facility])  I-STAT TROPOININ, ED   Results for orders placed or performed during the hospital encounter of 82/99/37  Basic metabolic panel  Result Value Ref Range   Sodium 141 135 - 145 mmol/L   Potassium 4.1 3.5 - 5.1 mmol/L   Chloride 111 101 - 111 mmol/L   CO2 20 (L) 22 - 32 mmol/L   Glucose, Bld 93 65 - 99 mg/dL   BUN 16 6 - 20 mg/dL   Creatinine, Ser 1.41 (H) 0.44 - 1.00 mg/dL   Calcium 9.0 8.9 - 10.3 mg/dL   GFR calc non Af Amer 37 (L) >60 mL/min   GFR calc Af Amer 42 (L) >60 mL/min   Anion gap 10 5 - 15  CBC  Result Value Ref Range   WBC 8.0 4.0 - 10.5 K/uL   RBC 4.61 3.87 - 5.11 MIL/uL   Hemoglobin 13.4 12.0 - 15.0 g/dL   HCT 41.5 36.0 - 46.0 %   MCV 90.0 78.0 -  100.0 fL   MCH  29.1 26.0 - 34.0 pg   MCHC 32.3 30.0 - 36.0 g/dL   RDW 17.0 (H) 11.5 - 15.5 %   Platelets 280 150 - 400 K/uL  D-dimer, quantitative (not at [facility])  Result Value Ref Range   D-Dimer, Quant <0.27 0.00 - 0.50 ug/mL-FEU  Brain natriuretic peptide  Result Value Ref Range   B Natriuretic Peptide 424.4 (H) 0.0 - 100.0 pg/mL  I-stat troponin, ED  Result Value Ref Range   Troponin i, poc 0.03 0.00 - 0.08 ng/mL   Comment 3             EKG  EKG Interpretation  Date/Time:  Monday June 23 2016 15:08:13 EDT Ventricular Rate:  74 PR Interval:    QRS Duration: 79 QT Interval:  434 QTC Calculation: 482 R Axis:   57 Text Interpretation:  Sinus rhythm Left atrial enlargement Probable LVH with secondary repol abnrm Similar to prior ECG Interpretation limited secondary to artifact Reconfirmed by Demari Gales  MD, Malcomb Gangemi [phone]) on 06/23/2016 4:08:15 PM       Radiology Dg Chest 2 View  Result Date: 06/23/2016 CLINICAL DATA:  Chest pain and shortness of breath, acute onset. EXAM: CHEST  2 VIEW COMPARISON:  05/26/2016. FINDINGS: Trachea is midline. Heart is enlarged. Thoracic aorta is calcified. Linear scarring in the lingula. Lungs are otherwise clear. No pleural fluid. IMPRESSION: No acute findings. Electronically Signed   By: Lorin Picket M.D.   On: 06/23/2016 15:56    Procedures Procedures (including critical care time)  Medications Ordered in ED Medications - No data to display   Initial Impression / Assessment and Plan / ED Course  I have reviewed the triage vital signs and the nursing notes.  Pertinent labs & imaging results that were available during my care of the patient were reviewed by me and considered in my medical decision making (see chart for details).    Patient presented the with the onset of chest pain started 12:30 last a total 1530. Substernal pressure in nature. One episode of vomiting no diaphoresis. Shortness of breath. EMS gave her 1 sublingual nitroglycerin. No  significant change. Patient also with oxygen requirement. Patient not normally on oxygen. Patient sats will go into the 80s on room air. Patient was at pulmonology earlier today had pulmonary function tests showing COPD. Patient currently without any wheezing. Patient with significant cardiac history.  Patient's cardiac history is significant for apical variant hypertrophic cardiomyopathy atrial flutter with rapid ventricular response. Also a non-STEMI in 2016. Patient had cardiac cath at that time with diagonal vessel showing 75% stenosis. No other significant or near significant stenosis.  Patient's workup here first troponin negative d-dimer negative. Patient without any wheezing. Discussed with the cardiology they will consult. Will have patient admitted to the triad hospitalist for the hypoxia. May very well just be COPD related. Patient does warrant cardiac rule out as well.  Chest pain resolved at 3:30 this afternoon. Patient already on Cymbalta for atrial fib.   Final Clinical Impressions(s) / ED Diagnoses   Final diagnoses:  Precordial pain  Hypoxia    New Prescriptions New Prescriptions   No medications on file     Fredia Sorrow, MD 06/23/16 1757   Addendum: Seen by cardiology Dr. Dorris Carnes. They will follow the patient in consultation. Discussed with hospitalist they will do admission.   Fredia Sorrow, MD 06/23/16 1820

## 2016-06-23 NOTE — ED Notes (Signed)
Pt o2 saturation 88% while on 2L. Pt increased to 2.5L [place]

## 2016-06-23 NOTE — Progress Notes (Signed)
Subjective:   Patient ID: Amy [name], female    DOB: May 04, 1945, .   MRN: [id number]  Dr Gwenette Greet 05/12/13 final OV:  seen in 2013 where her dyspnea on exertion was felt secondary to minimal obstructive disease, obesity and deconditioning, and also diastolic dysfunction.complaining of progressive dyspnea on exertion x one  year. In the interim, she's been diagnosed with atrial fibrillation, and has been on amiodarone for over a year. She was in the hospital one month prior to [place]   for a mild pneumonia with pleurisy, but feels that she has gotten over this and return to baseline. While in the hospital, she underwent an echocardiogram that showed a normal ejection fraction, but she did have mild MR and moderate left atrial dilatation. Her peak PA pressures were 72 by echo. She has had a left and right cardiac catheterization in 2013 that showed coronary disease, as well as an increased left ventricular end-diastolic pressure consistent with diastolic dysfunction. The patient denies any symptoms of acute bronchospasm. Her weight has increased 9 pounds since the last visit here. rec Discontinue advair.  You have very little airflow obstruction on your most recent breathing studies. Will give you the flu shot today.  Will send a note to Dr. Radford Pax to consider doing a right heart cath to evaluate your pulmonary hypertension. Will check bloodwork today to see if your amiodarone may be contributing to your shortness of breath. Work on weight loss, and stay on cpap compliantly.  Will arrange followup once your results are available.       05/26/2016 1st [facility] office visit/ Keshanna Riso  / smoker with GOLD I copd if use fev1/VC ratio / on multaq /xarelto Chief Complaint  Patient presents with  . Pulmonary Consult    Self referral. Pt c/o DOE with "any activity"  for the past several months.    sitting ok RA no sob  Sleeping on cpap s 02 feels rested/ f/u by Dr Radford Pax Has had to ride the  scooter at [facility] x at least a year / uses [facility]  Baseline wt = 210  No cough or leg swelling  No better with saba in past  Doe = MMRC3 = can't walk 100 yards even at a slow pace at a flat grade s stopping due to sob   rec Bevespi Take 2 puffs first thing in am and then another 2 puffs about 12 hours later.  Work on inhaler technique:     06/23/2016  f/u ov/Nikiah Goin re:  AB/ no copd by pfts prior to [place] Complaint  Patient presents with  . Follow-up    [facility] done [facility] at [facility]. Pt states her breathing is unchanged. She states started having pressure in her chest and then vomiting after eating lunch today.       Acute chest pressure, tachycardia about one hour p saba for pfts and then N and V > resolved now but chest tighttness  Now 5/10 where was 7/10 and says ntg doesn't work, just lopressor which she doesn't have with her.   No cough   No obvious day to day or daytime variability or assoc excess/ purulent sputum or mucus plugs or hemoptysis or  subjective wheeze or overt sinus or hb symptoms. No unusual exp hx or h/o childhood pna/ asthma or knowledge of premature birth.  Sleeping ok without nocturnal  or early am exacerbation  of respiratory  c/o's or need for noct saba. Also denies any obvious  fluctuation of symptoms with weather or environmental changes or other aggravating or alleviating factors except as outlined above   Current Medications, Allergies, Complete Past Medical History, Past Surgical History, Family History, and Social History were reviewed in [employer] record.  ROS  The following are not active complaints unless bolded sore throat, dysphagia, dental problems, itching, sneezing,  nasal congestion or excess/ purulent secretions, ear ache,   fever, chills, sweats, unintended wt loss, classically pleuritic or exertional cp,  orthopnea pnd or leg swelling, presyncope, palpitations, abdominal pain, anorexia, nausea, vomiting, diarrhea  or change in  bowel or bladder habits, change in stools or urine, dysuria,hematuria,  rash, arthralgias, visual complaints, headache, numbness, weakness or ataxia or problems with walking or coordination,  change in mood/affect or memory.                       Objective:   Physical Exam  w/c bound  bf nad     06/23/2016        196   05/26/16 199 lb 6.4 oz (90.4 kg)  03/21/16 203 lb 3.2 oz (92.2 kg)  02/13/16 206 lb (93.4 kg)    Vital signs reviewed -  - Note on arrival 02 sats  85% on RA in w/c     HEENT: nl dentition, turbinates bilaterally, and oropharynx. Nl external ear canals without cough reflex   NECK :  without JVD/Nodes/TM/ nl carotid upstrokes bilaterally   LUNGS: no acc muscle use,  Nl contour chest which is clear to A and P bilaterally without cough on insp or exp maneuvers   CV:  RRR  no s3 or murmur or increase in P2, and no edema   ABD:  soft and nontender with nl inspiratory excursion in the supine position. No bruits or organomegaly appreciated, bowel sounds nl  MS:  Nl gait/ ext warm without deformities, calf tenderness, cyanosis or clubbing No obvious joint restrictions   SKIN: warm and dry without lesions    NEURO:  alert, approp, nl sensorium with  no motor or cerebellar deficits apparent.    CXR PA and Lateral:   05/26/2016 :    I personally reviewed images and agree with radiology impression as follows:   COPD. No acute pneumonia. Cardiomegaly with mild central pulmonary vascular congestion. No definite pulmonary edema.   Labs ordered/ reviewed:      Chemistry      Component Value Date/Time   NA 141 05/26/2016 1711   K 4.1 05/26/2016 1711   CL 109 05/26/2016 1711   CO2 18 (L) 05/26/2016 1711   BUN 26 (H) 05/26/2016 1711   CREATININE 1.80 (H) 05/26/2016 1711   CREATININE 1.51 (H) 02/13/2016 1544      Component Value Date/Time   CALCIUM 9.0 05/26/2016 1711   ALKPHOS 112 11/19/2015 0025   AST 22 11/19/2015 0025   ALT 28 11/19/2015 0025    BILITOT 0.5 11/19/2015 0025        Lab Results  Component Value Date   WBC 8.2 05/26/2016   HGB 13.6 05/26/2016   HCT 43.0 05/26/2016   MCV 92.8 05/26/2016   PLT 263.0 05/26/2016         Lab Results  Component Value Date   TSH 0.34 (L) 05/26/2016     Lab Results  Component Value Date   PROBNP 86.0 04/21/2014        ekg 06/23/2016  ST lateral st inversions more pronounced  Assessment & Plan:

## 2016-06-24 ENCOUNTER — Other Ambulatory Visit (HOSPITAL_COMMUNITY): Payer: Medicare Other

## 2016-06-24 ENCOUNTER — Observation Stay (HOSPITAL_BASED_OUTPATIENT_CLINIC_OR_DEPARTMENT_OTHER): Payer: Medicare Other

## 2016-06-24 DIAGNOSIS — I422 Other hypertrophic cardiomyopathy: Secondary | ICD-10-CM | POA: Diagnosis not present

## 2016-06-24 DIAGNOSIS — R0789 Other chest pain: Secondary | ICD-10-CM | POA: Diagnosis not present

## 2016-06-24 DIAGNOSIS — G4733 Obstructive sleep apnea (adult) (pediatric): Secondary | ICD-10-CM | POA: Diagnosis not present

## 2016-06-24 DIAGNOSIS — I252 Old myocardial infarction: Secondary | ICD-10-CM | POA: Diagnosis not present

## 2016-06-24 DIAGNOSIS — I270 Primary pulmonary hypertension: Secondary | ICD-10-CM | POA: Diagnosis not present

## 2016-06-24 DIAGNOSIS — E039 Hypothyroidism, unspecified: Secondary | ICD-10-CM

## 2016-06-24 DIAGNOSIS — R0902 Hypoxemia: Secondary | ICD-10-CM

## 2016-06-24 DIAGNOSIS — Z7901 Long term (current) use of anticoagulants: Secondary | ICD-10-CM | POA: Diagnosis not present

## 2016-06-24 DIAGNOSIS — I272 Pulmonary hypertension, unspecified: Secondary | ICD-10-CM | POA: Diagnosis not present

## 2016-06-24 DIAGNOSIS — E785 Hyperlipidemia, unspecified: Secondary | ICD-10-CM | POA: Diagnosis present

## 2016-06-24 DIAGNOSIS — N183 Chronic kidney disease, stage 3 (moderate): Secondary | ICD-10-CM

## 2016-06-24 DIAGNOSIS — R072 Precordial pain: Secondary | ICD-10-CM | POA: Diagnosis not present

## 2016-06-24 DIAGNOSIS — I480 Paroxysmal atrial fibrillation: Secondary | ICD-10-CM | POA: Diagnosis not present

## 2016-06-24 DIAGNOSIS — M62830 Muscle spasm of back: Secondary | ICD-10-CM | POA: Diagnosis present

## 2016-06-24 DIAGNOSIS — Z888 Allergy status to other drugs, medicaments and biological substances status: Secondary | ICD-10-CM | POA: Diagnosis not present

## 2016-06-24 DIAGNOSIS — I5032 Chronic diastolic (congestive) heart failure: Secondary | ICD-10-CM | POA: Diagnosis not present

## 2016-06-24 DIAGNOSIS — I481 Persistent atrial fibrillation: Secondary | ICD-10-CM | POA: Diagnosis present

## 2016-06-24 DIAGNOSIS — R0600 Dyspnea, unspecified: Secondary | ICD-10-CM

## 2016-06-24 DIAGNOSIS — Z79899 Other long term (current) drug therapy: Secondary | ICD-10-CM | POA: Diagnosis not present

## 2016-06-24 DIAGNOSIS — Z87891 Personal history of nicotine dependence: Secondary | ICD-10-CM | POA: Diagnosis not present

## 2016-06-24 DIAGNOSIS — Z8249 Family history of ischemic heart disease and other diseases of the circulatory system: Secondary | ICD-10-CM | POA: Diagnosis not present

## 2016-06-24 DIAGNOSIS — I130 Hypertensive heart and chronic kidney disease with heart failure and stage 1 through stage 4 chronic kidney disease, or unspecified chronic kidney disease: Secondary | ICD-10-CM | POA: Diagnosis present

## 2016-06-24 DIAGNOSIS — K219 Gastro-esophageal reflux disease without esophagitis: Secondary | ICD-10-CM | POA: Diagnosis present

## 2016-06-24 DIAGNOSIS — J449 Chronic obstructive pulmonary disease, unspecified: Secondary | ICD-10-CM | POA: Diagnosis present

## 2016-06-24 DIAGNOSIS — R0602 Shortness of breath: Secondary | ICD-10-CM | POA: Diagnosis not present

## 2016-06-24 DIAGNOSIS — I10 Essential (primary) hypertension: Secondary | ICD-10-CM | POA: Diagnosis not present

## 2016-06-24 DIAGNOSIS — J9601 Acute respiratory failure with hypoxia: Secondary | ICD-10-CM | POA: Diagnosis present

## 2016-06-24 DIAGNOSIS — I2510 Atherosclerotic heart disease of native coronary artery without angina pectoris: Secondary | ICD-10-CM | POA: Diagnosis present

## 2016-06-24 DIAGNOSIS — I484 Atypical atrial flutter: Secondary | ICD-10-CM | POA: Diagnosis present

## 2016-06-24 DIAGNOSIS — H3530 Unspecified macular degeneration: Secondary | ICD-10-CM | POA: Diagnosis present

## 2016-06-24 DIAGNOSIS — I700 Atherosclerosis of aorta: Secondary | ICD-10-CM | POA: Diagnosis not present

## 2016-06-24 LAB — COMPREHENSIVE METABOLIC PANEL
ALT: 38 U/L (ref 14–54)
ANION GAP: 10 (ref 5–15)
AST: 25 U/L (ref 15–41)
Albumin: 3.5 g/dL (ref 3.5–5.0)
Alkaline Phosphatase: 102 U/L (ref 38–126)
BUN: 15 mg/dL (ref 6–20)
CHLORIDE: 107 mmol/L (ref 101–111)
CO2: 24 mmol/L (ref 22–32)
CREATININE: 1.3 mg/dL — AB (ref 0.44–1.00)
Calcium: 9.1 mg/dL (ref 8.9–10.3)
GFR calc non Af Amer: 40 mL/min — ABNORMAL LOW (ref >60)
GFR, EST AFRICAN AMERICAN: 47 mL/min — AB (ref >60)
Glucose, Bld: 67 mg/dL (ref 65–99)
POTASSIUM: 4.1 mmol/L (ref 3.5–5.1)
SODIUM: 141 mmol/L (ref 135–145)
Total Bilirubin: 1 mg/dL (ref 0.3–1.2)
Total Protein: 6.6 g/dL (ref 6.5–8.1)

## 2016-06-24 LAB — CBC
HCT: 38.7 % (ref 36.0–46.0)
Hemoglobin: 12.5 g/dL (ref 12.0–15.0)
MCH: 28.9 pg (ref 26.0–34.0)
MCHC: 32.3 g/dL (ref 30.0–36.0)
MCV: 89.6 fL (ref 78.0–100.0)
PLATELETS: 268 10*3/uL (ref 150–400)
RBC: 4.32 MIL/uL (ref 3.87–5.11)
RDW: 17.1 % — AB (ref 11.5–15.5)
WBC: 8.5 10*3/uL (ref 4.0–10.5)

## 2016-06-24 LAB — ECHOCARDIOGRAM COMPLETE
Height: 63 in
WEIGHTICAEL: 3156.8 [oz_av]

## 2016-06-24 LAB — TROPONIN I
Troponin I: 0.08 ng/mL (ref <0.03)
Troponin I: 0.1 ng/mL (ref <0.03)

## 2016-06-24 MED ORDER — SODIUM CHLORIDE 0.9 % IV SOLN: 250.0000 mL | INTRAVENOUS | Status: DC | PRN

## 2016-06-24 MED ORDER — SODIUM CHLORIDE 0.9 % IV SOLN
INTRAVENOUS | Status: DC
  Administered 2016-06-25: 09:00:00 via INTRAVENOUS

## 2016-06-24 MED ORDER — SODIUM CHLORIDE 0.9% FLUSH: 3.0000 mL | INTRAVENOUS | Status: DC | PRN

## 2016-06-24 MED ORDER — PERFLUTREN LIPID MICROSPHERE
1.0000 mL | INTRAVENOUS | Status: AC | PRN
  Administered 2016-06-24: 2 mL via INTRAVENOUS
  Filled 2016-06-24: qty 10

## 2016-06-24 MED ORDER — SODIUM CHLORIDE 0.9% FLUSH
3.0000 mL | Freq: Two times a day (BID) | INTRAVENOUS | Status: DC
  Administered 2016-06-24 – 2016-06-25 (×2): 3 mL via INTRAVENOUS

## 2016-06-24 MED ORDER — ASPIRIN 81 MG PO CHEW
81.0000 mg | CHEWABLE_TABLET | ORAL | Status: AC
  Administered 2016-06-25: 81 mg via ORAL
  Filled 2016-06-24: qty 1

## 2016-06-24 NOTE — Progress Notes (Addendum)
PROGRESS NOTE    Amy [name]  HFG:[id number] DOB: 1945-05-12 DOA: 06/23/2016 PCP: Aretta Nip, MD   Outpatient Specialists:     Brief Narrative:  Amy [name] is a 71 y.o. 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  She was due to see Dr. Melvyn Novas in clinic this afternoon.  After her testing, she went to [place] for lunch with her daughter.  While there, she began to notice that she was not feeling well. She developed substernal chest pressure, nonradiating, associated with shortness of breath.  She vomited in the parking lot at [place], which made her feel a bit better.  The intensity of the chest pressure improved.  She actually drove herself to Dr. Gustavus Bryant office while still having active chest pain.  She reported her symptoms upon arrival.  EMS was called for emergent transfer to the ED.  She received 1 SL NTG en route.  Chest pain resolved after about three hours.  However, patient found to have low O2 saturations on RA in the ED (86-87%), which is new.  No active wheezing.     Assessment & Plan:   Principal Problem:   Chest pain Active Problems:   Hypothyroidism   Essential hypertension   Apical variant hypertrophic cardiomyopathy (HCC)   Chronic diastolic heart failure (HCC)   PAF (paroxysmal atrial fibrillation) (HCC)   OSA (obstructive sleep apnea)   Hypoxia   Pulmonary hypertension   Chronic anticoagulation   Chronic renal insufficiency, stage III (moderate)  Chest pain in a patient with known CAD, prior NSTEMI, multiple risk factors. --Cardiology consultation appreciated --asa --Echo in the AM-- may need cath --Consider trial of IV diuretic --Continue beta  blocker and statin  Atrial fibrillation --Multaq, lopressor, Xarelto  Acute hypoxia, etiology unclear.  Normal D-Dimer.  Does not appear to be in fulminant CHF.  Pulmonary status appears stable. --Wean supplemental oxygen as tolerated --Continue CPAP qHS for OSA  History of diastolic CHF --echo pending -given lasix x 1 in ER  COPD --Xopenex nebs prn --HOLD long acting inhaler while in observation status; can order if it appears patient will have prolonged hospitalization  Hypothyroidism --Continue home dose of levothyroxine -recent TSH shows mild overcorrection which was addressed as an outpatient  CKD- stage III --watch closely with diuresis   DVT prophylaxis:  Fully anticoagulated   Code Status: Full Code   Family Communication: patient  Disposition Plan:  Echo pending   Consultants:   cards     Subjective: Still with some SOB   Objective: Vitals:   06/23/16 2118 06/23/16 2305 06/24/16 0545 06/24/16 0814  BP: (!) 147/65  121/74   Pulse: 65  65 (!) 51  Resp: _0 Temp: 97.6 F (36.4 C)  98.3 F (36.8 C)   TempSrc: Oral  Oral   SpO2: 97%  96%   Weight: 89.5 kg (197 lb 4.8 oz)     Height: _1  (1.6 m)       Intake/Output Summary (Last 24 hours) at 06/24/16 1110 Last data filed at 06/23/16 2215  Gross per 24 hour  Intake              240 ml  Output  200 ml  Net               40 ml   Filed Weights   06/23/16 1538 06/23/16 2118  Weight: 88.9 kg (196 lb) 89.5 kg (197 lb 4.8 oz)    Examination:  General exam: Appears calm and comfortable  Respiratory system: Clear to auscultation. Respiratory effort normal. Cardiovascular system: S1 & S2 heard, RRR. No JVD, murmurs, rubs, gallops or clicks. No pedal edema. Gastrointestinal system: Abdomen is nondistended, soft and nontender. No organomegaly or masses felt. Normal bowel sounds heard. Central nervous system: Alert and oriented. No focal neurological  deficits. Extremities: Symmetric 5 x 5 power.     Data Reviewed: I have personally reviewed following labs and imaging studies  CBC:  Recent Labs Lab 06/23/16 1510 06/24/16 0305  WBC 8.0 8.5  HGB 13.4 12.5  HCT 41.5 38.7  MCV 90.0 89.6  PLT 280 532   Basic Metabolic Panel:  Recent Labs Lab 06/23/16 1510 06/24/16 0305  NA 141 141  K 4.1 4.1  CL 111 107  CO2 20* 24  GLUCOSE 93 67  BUN 16 15  CREATININE 1.41* 1.30*  CALCIUM 9.0 9.1   GFR: Estimated Creatinine Clearance: 42.1 mL/min (A) (by C-G formula based on SCr of 1.3 mg/dL (H)). Liver Function Tests:  Recent Labs Lab 06/24/16 0305  AST 25  ALT 38  ALKPHOS 102  BILITOT 1.0  PROT 6.6  ALBUMIN 3.5   No results for input(s): LIPASE, AMYLASE in the last 168 hours. No results for input(s): AMMONIA in the last 168 hours. Coagulation Profile: No results for input(s): INR, PROTIME in the last 168 hours. Cardiac Enzymes:  Recent Labs Lab 06/23/16 2100 06/23/16 2331 06/24/16 0305  TROPONINI 0.08* 0.08* 0.10*   BNP (last 3 results) No results for input(s): PROBNP in the last 8760 hours. HbA1C: No results for input(s): HGBA1C in the last 72 hours. CBG: No results for input(s): GLUCAP in the last 168 hours. Lipid Profile: No results for input(s): CHOL, HDL, LDLCALC, TRIG, CHOLHDL, LDLDIRECT in the last 72 hours. Thyroid Function Tests: No results for input(s): TSH, T4TOTAL, FREET4, T3FREE, THYROIDAB in the last 72 hours. Anemia Panel: No results for input(s): VITAMINB12, FOLATE, FERRITIN, TIBC, IRON, RETICCTPCT in the last 72 hours. Urine analysis: No results found for: COLORURINE, APPEARANCEUR, LABSPEC, [place], GLUCOSEU, HGBUR, BILIRUBINUR, KETONESUR, PROTEINUR, UROBILINOGEN, NITRITE, LEUKOCYTESUR   )No results found for this or any previous visit (from the past 240 hour(s)).    Anti-infectives    None       Radiology Studies: Dg Chest 2 View  Result Date: 06/23/2016 CLINICAL DATA:  Chest  pain and shortness of breath, acute onset. EXAM: CHEST  2 VIEW COMPARISON:  05/26/2016. FINDINGS: Trachea is midline. Heart is enlarged. Thoracic aorta is calcified. Linear scarring in the lingula. Lungs are otherwise clear. No pleural fluid. IMPRESSION: No acute findings. Electronically Signed   By: Lorin Picket M.D.   On: 06/23/2016 15:56        Scheduled Meds: . atorvastatin  80 mg Oral Q lunch  . dronedarone  400 mg Oral BID WC  . ezetimibe  10 mg Oral Daily  . furosemide  20 mg Oral Daily  . furosemide  20 mg Oral Once  . levothyroxine  100 mcg Oral QAC breakfast  . metoprolol tartrate  12.5 mg Oral BID  . pantoprazole  40 mg Oral Daily   Continuous Infusions:   LOS: 0 days    Time spent:  25 min    [place], DO [facility] Pager [phone]  If 7PM-7AM, please contact night-coverage [email or web address] Password [email or web address] 06/24/2016, 11:10 AM

## 2016-06-24 NOTE — Progress Notes (Signed)
Provider on call notified of Trop 0.08 via Amion text page. Pt is pain free/VS stable. Will continue to monitor. Jessie Foot, RN

## 2016-06-24 NOTE — Progress Notes (Signed)
Progress Note  Patient Name: Amy [name] Date of Encounter: 06/24/2016  Primary Cardiologist: Earnestine Mealing    Subjective   No CP  Still some SOB  Inpatient Medications    Scheduled Meds: . atorvastatin  80 mg Oral Q lunch  . dronedarone  400 mg Oral BID WC  . ezetimibe  10 mg Oral Daily  . furosemide  20 mg Oral Daily  . furosemide  20 mg Oral Once  . levothyroxine  100 mcg Oral QAC breakfast  . metoprolol tartrate  12.5 mg Oral BID  . pantoprazole  40 mg Oral Daily   Continuous Infusions:  PRN Meds: acetaminophen, levalbuterol, morphine injection, nitroGLYCERIN, ondansetron (ZOFRAN) IV, zolpidem   Vital Signs    Vitals:   06/23/16 2118 06/23/16 2305 06/24/16 0545 06/24/16 0814  BP: (!) 147/65  121/74   Pulse: 65  65 (!) 51  Resp: _0 Temp: 97.6 F (36.4 C)  98.3 F (36.8 C)   TempSrc: Oral  Oral   SpO2: 97%  96%   Weight: 197 lb 4.8 oz (89.5 kg)     Height: _1  (1.6 m)       Intake/Output Summary (Last 24 hours) at 06/24/16 1058 Last data filed at 06/23/16 2215  Gross per 24 hour  Intake              240 ml  Output              200 ml  Net               40 ml   Filed Weights   06/23/16 1538 06/23/16 2118  Weight: 196 lb (88.9 kg) 197 lb 4.8 oz (89.5 kg)    Telemetry    SR   - Personally Reviewed  ECG    Physical Exam   GEN: No acute distress.    Obese   Neck: No JVD Cardiac: RRR, no murmurs, rubs, or gallops.  Respiratory: Minimal wheeze  Moving air   GI: Soft, nontender, non-distended  MS: No edema; No deformity. Neuro:  Nonfocal  Psych: Normal affect   Labs    Chemistry Recent Labs Lab 06/23/16 1510 06/24/16 0305  NA 141 141  K 4.1 4.1  CL 111 107  CO2 20* 24  GLUCOSE 93 67  BUN 16 15  CREATININE 1.41* 1.30*  CALCIUM 9.0 9.1  PROT  --  6.6  ALBUMIN  --  3.5  AST  --  25  ALT  --  38  ALKPHOS  --  102  BILITOT  --  1.0  GFRNONAA 37* 40*  GFRAA 42* 47*  ANIONGAP 10 10     Hematology Recent  Labs Lab 06/23/16 1510 06/24/16 0305  WBC 8.0 8.5  RBC 4.61 4.32  HGB 13.4 12.5  HCT 41.5 38.7  MCV 90.0 89.6  MCH 29.1 28.9  MCHC 32.3 32.3  RDW 17.0* 17.1*  PLT 280 268    Cardiac Enzymes Recent Labs Lab 06/23/16 2100 06/23/16 2331 06/24/16 0305  TROPONINI 0.08* 0.08* 0.10*    Recent Labs Lab 06/23/16 1544  TROPIPOC 0.03     BNP Recent Labs Lab 06/23/16 1510  BNP 424.4*     DDimer  Recent Labs Lab 06/23/16 1510  DDIMER <0.27     Radiology    Dg Chest 2 View  Result Date: 06/23/2016 CLINICAL DATA:  Chest pain and shortness of breath, acute onset. EXAM: CHEST  2 VIEW COMPARISON:  05/26/2016. FINDINGS: Trachea is midline. Heart is enlarged. Thoracic aorta is calcified. Linear scarring in the lingula. Lungs are otherwise clear. No pleural fluid. IMPRESSION: No acute findings. Electronically Signed   By: Lorin Picket M.D.   On: 06/23/2016 15:56    Cardiac Studies     Patient Profile     71 y.o. female hx of nonobstructive CAD, apical HCM, distolic dysfunction, PAF HTN and COPD  Admitted yesterday with chest tightness and SOB  After PFTs done   Hypoxic in clinic and in ER    Assessment & Plan    1 Chest tightness/SOB No tightness Breathing still short.  Trop minimally increased at 0.1  This occurred after hypoxic insetting of HCM  ? Significance   Pt got lasix   Says she had increased UO. D Dimer negative   Echo pending  May indeed need R and L heart cath depending on echo and clinical course   Note got Xarelto yesterday    2  PAF  Remains in SR  3  HTN  BP labile but not bad.    Signed, Dorris Carnes, MD  06/24/2016, 10:58 AM

## 2016-06-24 NOTE — Progress Notes (Signed)
Reviewed echo.  LVEF normal    Significant apical hypertrophy  Gr III diastolic dysfunction  RV function is OK There appears to be severe pulmonary hypertension   Reviewed with T Turner Would recomm R heart cath to confirm/quantify pressures Discussed with pt who is agreeable.

## 2016-06-24 NOTE — Progress Notes (Signed)
  Echocardiogram 2D Echocardiogram has been performed with definity.  Aggie Cosier 06/24/2016, 3:32 PM

## 2016-06-25 ENCOUNTER — Encounter (HOSPITAL_COMMUNITY)
Admission: EM | Disposition: A | Discharge status: Home Health | Payer: Self-pay | Source: Home / Self Care | Attending: Internal Medicine

## 2016-06-25 ENCOUNTER — Inpatient Hospital Stay (HOSPITAL_COMMUNITY): Payer: Medicare Other

## 2016-06-25 DIAGNOSIS — I422 Other hypertrophic cardiomyopathy: Secondary | ICD-10-CM

## 2016-06-25 DIAGNOSIS — I272 Pulmonary hypertension, unspecified: Secondary | ICD-10-CM

## 2016-06-25 DIAGNOSIS — I10 Essential (primary) hypertension: Secondary | ICD-10-CM

## 2016-06-25 HISTORY — PX: RIGHT HEART CATH: CATH118263

## 2016-06-25 LAB — POCT I-STAT 3, VENOUS BLOOD GAS (G3P V)
Acid-base deficit: 3 mmol/L — ABNORMAL HIGH (ref 0.0–2.0)
Bicarbonate: 21.4 mmol/L (ref 20.0–28.0)
O2 SAT: 57 %
PCO2 VEN: 36.6 mmHg — AB (ref 44.0–60.0)
TCO2: 23 mmol/L (ref 0–100)
pH, Ven: 7.375 (ref 7.250–7.430)
pO2, Ven: 30 mmHg — CL (ref 32.0–45.0)

## 2016-06-25 LAB — PROTIME-INR
INR: 1.82
PROTHROMBIN TIME: 21.3 s — AB (ref 11.4–15.2)

## 2016-06-25 SURGERY — RIGHT HEART CATH

## 2016-06-25 MED ORDER — LIDOCAINE HCL (PF) 1 % IJ SOLN
INTRAMUSCULAR | Status: DC | PRN
  Administered 2016-06-25: 15 mL via SUBCUTANEOUS

## 2016-06-25 MED ORDER — FENTANYL CITRATE (PF) 100 MCG/2ML IJ SOLN
INTRAMUSCULAR | Status: DC | PRN
  Administered 2016-06-25: 25 ug via INTRAVENOUS

## 2016-06-25 MED ORDER — MIDAZOLAM HCL 2 MG/2ML IJ SOLN
INTRAMUSCULAR | Status: DC | PRN
  Administered 2016-06-25: 1 mg via INTRAVENOUS

## 2016-06-25 MED ORDER — MIDAZOLAM HCL 2 MG/2ML IJ SOLN
INTRAMUSCULAR | Status: AC
  Filled 2016-06-25: qty 2

## 2016-06-25 MED ORDER — TECHNETIUM TO 99M ALBUMIN AGGREGATED
4.0000 | Freq: Once | INTRAVENOUS | Status: AC | PRN
  Administered 2016-06-25: 4 via INTRAVENOUS

## 2016-06-25 MED ORDER — HEPARIN (PORCINE) IN NACL 2-0.9 UNIT/ML-% IJ SOLN
INTRAMUSCULAR | Status: DC | PRN
  Administered 2016-06-25: 1000 mL

## 2016-06-25 MED ORDER — SODIUM CHLORIDE 0.9 % IV SOLN: 250.0000 mL | INTRAVENOUS | Status: DC | PRN

## 2016-06-25 MED ORDER — SODIUM CHLORIDE 0.9% FLUSH: 3.0000 mL | INTRAVENOUS | Status: DC | PRN

## 2016-06-25 MED ORDER — ASPIRIN 81 MG PO CHEW: 81.0000 mg | CHEWABLE_TABLET | ORAL | Status: DC

## 2016-06-25 MED ORDER — TECHNETIUM TC 99M DIETHYLENETRIAME-PENTAACETIC ACID: 30.0000 | Freq: Once | INTRAVENOUS | Status: DC | PRN

## 2016-06-25 MED ORDER — SODIUM CHLORIDE 0.9% FLUSH
3.0000 mL | Freq: Two times a day (BID) | INTRAVENOUS | Status: DC
  Administered 2016-06-26 (×2): 3 mL via INTRAVENOUS

## 2016-06-25 MED ORDER — SODIUM CHLORIDE 0.9% FLUSH
3.0000 mL | Freq: Two times a day (BID) | INTRAVENOUS | Status: DC
  Administered 2016-06-25 – 2016-06-26 (×3): 3 mL via INTRAVENOUS

## 2016-06-25 MED ORDER — HEPARIN (PORCINE) IN NACL 2-0.9 UNIT/ML-% IJ SOLN
INTRAMUSCULAR | Status: AC
  Filled 2016-06-25: qty 1000

## 2016-06-25 MED ORDER — SODIUM CHLORIDE 0.9 % IV SOLN: INTRAVENOUS | Status: DC

## 2016-06-25 MED ORDER — LIDOCAINE HCL (PF) 1 % IJ SOLN
INTRAMUSCULAR | Status: AC
  Filled 2016-06-25: qty 30

## 2016-06-25 MED ORDER — FENTANYL CITRATE (PF) 100 MCG/2ML IJ SOLN
INTRAMUSCULAR | Status: AC
  Filled 2016-06-25: qty 2

## 2016-06-25 SURGICAL SUPPLY — 8 items
CATH SWAN GANZ 7F STRAIGHT (CATHETERS) ×3 IMPLANT
COVER PRB 48X5XTLSCP FOLD TPE (BAG) ×1 IMPLANT
COVER PROBE 5X48 (BAG) ×2
PROTECTION STATION PRESSURIZED (MISCELLANEOUS) ×3
SHEATH PINNACLE 7F 10CM (SHEATH) ×3 IMPLANT
STATION PROTECTION PRESSURIZED (MISCELLANEOUS) ×1 IMPLANT
TUBING ART PRESS 72  MALE/FEM (TUBING) ×2
TUBING ART PRESS 72 MALE/FEM (TUBING) ×1 IMPLANT

## 2016-06-25 NOTE — Progress Notes (Signed)
Site area: Rt fem venous sheath removed Site Prior to Removal:  Level 0 Pressure Applied For: 15 min Manual:   yes Patient Status During Pull:  A/O Post Pull Site:  Level 0 Post Pull Instructions Given:  yes Post Pull Pulses Present: 2+ rt dp/pt Dressing Applied:  Tegaderm and a 4x4 Bedrest begins @ 15:20:00 Comments: Pt leaves cath lab holding in stable condition. Rt groin is unremarkable. Dressing is CDI.

## 2016-06-25 NOTE — Interval H&P Note (Signed)
History and Physical Interval Note:  06/25/2016 2:26 PM  Amy [name]  has presented today for surgery, with the diagnosis of hp  The various methods of treatment have been discussed with the patient and family. After consideration of risks, benefits and other options for treatment, the patient has consented to  Procedure(s): Right Heart Cath (N/A) as a surgical intervention .  The patient's history has been reviewed, patient examined, no change in status, stable for surgery.  I have reviewed the patient's chart and labs.  Questions were answered to the patient's satisfaction.     Sherren Mocha

## 2016-06-25 NOTE — Progress Notes (Signed)
Progress Note  Patient Name: Amy [name] Date of Encounter: 06/25/2016  Primary Cardiologist: Turner/SKlein  Subjective   Breathing OK in bed  No Chest tightness  Inpatient Medications    Scheduled Meds: . atorvastatin  80 mg Oral Q lunch  . dronedarone  400 mg Oral BID WC  . ezetimibe  10 mg Oral Daily  . furosemide  20 mg Oral Daily  . furosemide  20 mg Oral Once  . levothyroxine  100 mcg Oral QAC breakfast  . metoprolol tartrate  12.5 mg Oral BID  . pantoprazole  40 mg Oral Daily  . sodium chloride flush  3 mL Intravenous Q12H   Continuous Infusions: . sodium chloride 10 mL/hr at 06/25/16 0926   PRN Meds: sodium chloride, acetaminophen, levalbuterol, morphine injection, nitroGLYCERIN, ondansetron (ZOFRAN) IV, sodium chloride flush, zolpidem   Vital Signs    Vitals:   06/24/16 0814 06/24/16 1332 06/24/16 2044 06/25/16 0428  BP:  137/72 116/67 122/67  Pulse: (!) 51 (!) 56 62 70  Resp:   18 18  Temp:  98.1 F (36.7 C) 98.3 F (36.8 C) 98.5 F (36.9 C)  TempSrc:  Oral Oral Oral  SpO2:  98% 93% 94%  Weight:    193 lb 6.4 oz (87.7 kg)  Height:        Intake/Output Summary (Last 24 hours) at 06/25/16 1116 Last data filed at 06/25/16 0600  Gross per 24 hour  Intake              845 ml  Output             1700 ml  Net             -855 ml   Filed Weights   06/23/16 1538 06/23/16 2118 06/25/16 0428  Weight: 196 lb (88.9 kg) 197 lb 4.8 oz (89.5 kg) 193 lb 6.4 oz (87.7 kg)    Telemetry    SR   - Personally Reviewed  ECG     Physical Exam   GEN: No acute distress.   Neck: No JVD Cardiac: RRR, no murmurs, rubs, or gallops.  Respiratory: Clear to auscultation bilaterally. GI: Soft, nontender, non-distended  MS: No edema; No deformity. Neuro:  Nonfocal  Psych: Normal affect   Labs    Chemistry Recent Labs Lab 06/23/16 1510 06/24/16 0305  NA 141 141  K 4.1 4.1  CL 111 107  CO2 20* 24  GLUCOSE 93 67  BUN 16 15  CREATININE 1.41*  1.30*  CALCIUM 9.0 9.1  PROT  --  6.6  ALBUMIN  --  3.5  AST  --  25  ALT  --  38  ALKPHOS  --  102  BILITOT  --  1.0  GFRNONAA 37* 40*  GFRAA 42* 47*  ANIONGAP 10 10     Hematology Recent Labs Lab 06/23/16 1510 06/24/16 0305  WBC 8.0 8.5  RBC 4.61 4.32  HGB 13.4 12.5  HCT 41.5 38.7  MCV 90.0 89.6  MCH 29.1 28.9  MCHC 32.3 32.3  RDW 17.0* 17.1*  PLT 280 268    Cardiac Enzymes Recent Labs Lab 06/23/16 2100 06/23/16 2331 06/24/16 0305  TROPONINI 0.08* 0.08* 0.10*    Recent Labs Lab 06/23/16 1544  TROPIPOC 0.03     BNP Recent Labs Lab 06/23/16 1510  BNP 424.4*     DDimer  Recent Labs Lab 06/23/16 1510  DDIMER <0.27     Radiology    Dg Chest 2  View  Result Date: 06/23/2016 CLINICAL DATA:  Chest pain and shortness of breath, acute onset. EXAM: CHEST  2 VIEW COMPARISON:  05/26/2016. FINDINGS: Trachea is midline. Heart is enlarged. Thoracic aorta is calcified. Linear scarring in the lingula. Lungs are otherwise clear. No pleural fluid. IMPRESSION: No acute findings. Electronically Signed   By: Lorin Picket M.D.   On: 06/23/2016 15:56    Cardiac Studies    Patient Profile     71 y.o. female hx of nonobstructive CAD, apical HCM, diastolic dysfunction PAF, HTN and COPD  Presented with chst tightness and SOB  Peak troponin 0.1  Hypoxic on admit  Assessment & Plan    1  Chest tightness, SOB   Echo yesterday with severe pulmonary HTN  REcomm R heart cath to evaluate  2  PAF  REmains in SR  Holding anticoagulation for now  3  HTN  Follow    4  COPD  R heart cath today   Signed, Dorris Carnes, MD  06/25/2016, 11:16 AM

## 2016-06-25 NOTE — Plan of Care (Signed)
Problem: Activity: Goal: Ability to tolerate increased activity will improve Outcome: Progressing Pt ambulated in hall with minimal assist

## 2016-06-25 NOTE — Progress Notes (Addendum)
PROGRESS NOTE    Amy [name]  ZTI:[id number] DOB: Mar 25, 1946 DOA: 06/23/2016 PCP: Aretta Nip, MD   Outpatient Specialists:     Brief Narrative:  Amy [name] is a 71 y.o. 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  She was due to see Dr. Melvyn Novas in clinic this afternoon.  After her testing, she went to [place] for lunch with her daughter.  While there, she began to notice that she was not feeling well. She developed substernal chest pressure, nonradiating, associated with shortness of breath.  She vomited in the parking lot at [place], which made her feel a bit better.  The intensity of the chest pressure improved.  She actually drove herself to Dr. Gustavus Bryant office while still having active chest pain.  She reported her symptoms upon arrival.  EMS was called for emergent transfer to the ED.  She received 1 SL NTG en route.  Chest pain resolved after about three hours.  However, patient found to have low O2 saturations on RA in the ED (86-87%), which is new.  No active wheezing.     Assessment & Plan:   Principal Problem:   Chest pain Active Problems:   Hypothyroidism   Essential hypertension   Apical variant hypertrophic cardiomyopathy (HCC)   Chronic diastolic heart failure (HCC)   PAF (paroxysmal atrial fibrillation) (HCC)   OSA (obstructive sleep apnea)   Hypoxia   Pulmonary hypertension   Chronic anticoagulation   Chronic renal insufficiency, stage III (moderate)  Chest pain in a patient with known CAD, prior NSTEMI, multiple risk factors. --Cardiology consultation appreciated --asa --Echo: Left ventricle: The cavity size was normal. Wall thickness was   increased  in a pattern of severe LVH. Systolic function was   vigorous. The estimated ejection fraction was in the range of 65%   to 70%. Wall motion was normal; there were no regional wall   motion abnormalities. Doppler parameters are consistent with a   reversible restrictive pattern, indicative of decreased left   ventricular diastolic compliance and/or increased left atrial   pressure (grade 3 diastolic dysfunction). - Left atrium: The atrium was severely dilated. - Right atrium: The atrium was mildly dilated. - Tricuspid valve: There was mild-moderate regurgitation. - Pulmonary arteries: Systolic pressure was severely increased. PA   peak pressure: 86 mm Hg (S). --based on echo results, plan is for RHC today  Atrial fibrillation --Multaq, lopressor, Xarelto (on hold for cath)  Acute hypoxia, etiology unclear.  Normal D-Dimer.  Does not appear to be in fulminant CHF.  Pulmonary status appears stable. --Wean supplemental oxygen as tolerated --Continue CPAP qHS for OSA  History of diastolic CHF --echo as above -given lasix x 1 in ER  COPD --Xopenex nebs prn  Hypothyroidism --Continue home dose of levothyroxine -recent TSH shows mild overcorrection which was addressed as an outpatient  CKD- stage III --watch closely with diuresis and cath   DVT prophylaxis:  Fully anticoagulated   Code Status: Full Code   Family Communication: patient  Disposition Plan:  [place] planned for 3/21   Consultants:   cards     Subjective: Feeling better-- still SOB with movement   Objective: Vitals:   06/24/16 8250 06/24/16 1332 06/24/16 2044 06/25/16 0428  BP:  137/72 116/67 122/67  Pulse: (!) 51 (!) 56 62 70  Resp:   18 18  Temp:  98.1 F (36.7 C) 98.3 F (36.8 C) 98.5 F (36.9 C)  TempSrc:  Oral Oral Oral  SpO2:  98% 93% 94%  Weight:    87.7 kg (193 lb 6.4 oz)  Height:        Intake/Output Summary (Last 24 hours) at 06/25/16 1104 Last data filed at 06/25/16 0600   Gross per 24 hour  Intake              845 ml  Output             1700 ml  Net             -855 ml   Filed Weights   06/23/16 1538 06/23/16 2118 06/25/16 0428  Weight: 88.9 kg (196 lb) 89.5 kg (197 lb 4.8 oz) 87.7 kg (193 lb 6.4 oz)    Examination:  General exam: Appears calm and comfortable  Respiratory system: Clear to auscultation. Respiratory effort normal. Cardiovascular system: S1 & S2 heard, RRR. No JVD, murmurs, rubs, gallops or clicks. No pedal edema. Gastrointestinal system: Abdomen is nondistended, soft and nontender. No organomegaly or masses felt. Normal bowel sounds heard. Central nervous system: Alert and oriented. No focal neurological deficits. Extremities: Symmetric 5 x 5 power.     Data Reviewed: I have personally reviewed following labs and imaging studies  CBC:  Recent Labs Lab 06/23/16 1510 06/24/16 0305  WBC 8.0 8.5  HGB 13.4 12.5  HCT 41.5 38.7  MCV 90.0 89.6  PLT 280 756   Basic Metabolic Panel:  Recent Labs Lab 06/23/16 1510 06/24/16 0305  NA 141 141  K 4.1 4.1  CL 111 107  CO2 20* 24  GLUCOSE 93 67  BUN 16 15  CREATININE 1.41* 1.30*  CALCIUM 9.0 9.1   GFR: Estimated Creatinine Clearance: 41.7 mL/min (A) (by C-G formula based on SCr of 1.3 mg/dL (H)). Liver Function Tests:  Recent Labs Lab 06/24/16 0305  AST 25  ALT 38  ALKPHOS 102  BILITOT 1.0  PROT 6.6  ALBUMIN 3.5   No results for input(s): LIPASE, AMYLASE in the last 168 hours. No results for input(s): AMMONIA in the last 168 hours. Coagulation Profile:  Recent Labs Lab 06/25/16 0603  INR 1.82   Cardiac Enzymes:  Recent Labs Lab 06/23/16 2100 06/23/16 2331 06/24/16 0305  TROPONINI 0.08* 0.08* 0.10*   BNP (last 3 results) No results for input(s): PROBNP in the last 8760 hours. HbA1C: No results for input(s): HGBA1C in the last 72 hours. CBG: No results for input(s): GLUCAP in the last 168 hours. Lipid Profile: No results for input(s): CHOL, HDL,  LDLCALC, TRIG, CHOLHDL, LDLDIRECT in the last 72 hours. Thyroid Function Tests: No results for input(s): TSH, T4TOTAL, FREET4, T3FREE, THYROIDAB in the last 72 hours. Anemia Panel: No results for input(s): VITAMINB12, FOLATE, FERRITIN, TIBC, IRON, RETICCTPCT in the last 72 hours. Urine analysis: No results found for: COLORURINE, APPEARANCEUR, LABSPEC, [place], GLUCOSEU, HGBUR, BILIRUBINUR, KETONESUR, PROTEINUR, UROBILINOGEN, NITRITE, LEUKOCYTESUR   )No results found for this or any previous visit (from the past 240 hour(s)).    Anti-infectives    None       Radiology Studies: Dg Chest 2 View  Result Date: 06/23/2016 CLINICAL DATA:  Chest pain and shortness of breath, acute onset. EXAM: CHEST  2 VIEW COMPARISON:  05/26/2016. FINDINGS: Trachea is midline. Heart is enlarged. Thoracic aorta  is calcified. Linear scarring in the lingula. Lungs are otherwise clear. No pleural fluid. IMPRESSION: No acute findings. Electronically Signed   By: Lorin Picket M.D.   On: 06/23/2016 15:56        Scheduled Meds: . atorvastatin  80 mg Oral Q lunch  . dronedarone  400 mg Oral BID WC  . ezetimibe  10 mg Oral Daily  . furosemide  20 mg Oral Daily  . furosemide  20 mg Oral Once  . levothyroxine  100 mcg Oral QAC breakfast  . metoprolol tartrate  12.5 mg Oral BID  . pantoprazole  40 mg Oral Daily  . sodium chloride flush  3 mL Intravenous Q12H   Continuous Infusions: . sodium chloride 10 mL/hr at 06/25/16 0926     LOS: 1 day    Time spent: 25 min    [place], DO [facility] Pager [phone]  If 7PM-7AM, please contact night-coverage [email or web address] Password [facility] 06/25/2016, 11:04 AM

## 2016-06-25 NOTE — Progress Notes (Addendum)
Right heart catheterization shows progression of pulmonary HTN from 2015 RA 9/4  RV 69/1 PA 69//23  M PAP 38  PCWP 8  PVR:  7.3 vs 9.6 [place] Units (dep on calculation)  I have reviewed with Drs Burt Knack and Aundra Dubin   WIll sched for V/Q scan to r/o occult PE Will attempt to get Rx for O[place]umit I have forwarded to pharmacy for review    Pt should ambulate with pulse oximeter to follow sats  Plan after d/c for f/u with Dr Aundra Dubin

## 2016-06-25 NOTE — H&P (View-Only) (Signed)
 Progress Note  Patient Name: Amy [name] Date of Encounter: 06/25/2016  Primary Cardiologist: Turner/SKlein  Subjective   Breathing OK in bed  No Chest tightness  Inpatient Medications    Scheduled Meds: . atorvastatin  80 mg Oral Q lunch  . dronedarone  400 mg Oral BID WC  . ezetimibe  10 mg Oral Daily  . furosemide  20 mg Oral Daily  . furosemide  20 mg Oral Once  . levothyroxine  100 mcg Oral QAC breakfast  . metoprolol tartrate  12.5 mg Oral BID  . pantoprazole  40 mg Oral Daily  . sodium chloride flush  3 mL Intravenous Q12H   Continuous Infusions: . sodium chloride 10 mL/hr at 06/25/16 0926   PRN Meds: sodium chloride, acetaminophen, levalbuterol, morphine injection, nitroGLYCERIN, ondansetron (ZOFRAN) IV, sodium chloride flush, zolpidem   Vital Signs    Vitals:   06/24/16 0814 06/24/16 1332 06/24/16 2044 06/25/16 0428  BP:  137/72 116/67 122/67  Pulse: (!) 51 (!) 56 62 70  Resp:   18 18  Temp:  98.1 F (36.7 C) 98.3 F (36.8 C) 98.5 F (36.9 C)  TempSrc:  Oral Oral Oral  SpO2:  98% 93% 94%  Weight:    193 lb 6.4 oz (87.7 kg)  Height:        Intake/Output Summary (Last 24 hours) at 06/25/16 1116 Last data filed at 06/25/16 0600  Gross per 24 hour  Intake              845 ml  Output             1700 ml  Net             -855 ml   Filed Weights   06/23/16 1538 06/23/16 2118 06/25/16 0428  Weight: 196 lb (88.9 kg) 197 lb 4.8 oz (89.5 kg) 193 lb 6.4 oz (87.7 kg)    Telemetry    SR   - Personally Reviewed  ECG     Physical Exam   GEN: No acute distress.   Neck: No JVD Cardiac: RRR, no murmurs, rubs, or gallops.  Respiratory: Clear to auscultation bilaterally. GI: Soft, nontender, non-distended  MS: No edema; No deformity. Neuro:  Nonfocal  Psych: Normal affect   Labs    Chemistry Recent Labs Lab 06/23/16 1510 06/24/16 0305  NA 141 141  K 4.1 4.1  CL 111 107  CO2 20* 24  GLUCOSE 93 67  BUN 16 15  CREATININE 1.41*  1.30*  CALCIUM 9.0 9.1  PROT  --  6.6  ALBUMIN  --  3.5  AST  --  25  ALT  --  38  ALKPHOS  --  102  BILITOT  --  1.0  GFRNONAA 37* 40*  GFRAA 42* 47*  ANIONGAP 10 10     Hematology Recent Labs Lab 06/23/16 1510 06/24/16 0305  WBC 8.0 8.5  RBC 4.61 4.32  HGB 13.4 12.5  HCT 41.5 38.7  MCV 90.0 89.6  MCH 29.1 28.9  MCHC 32.3 32.3  RDW 17.0* 17.1*  PLT 280 268    Cardiac Enzymes Recent Labs Lab 06/23/16 2100 06/23/16 2331 06/24/16 0305  TROPONINI 0.08* 0.08* 0.10*    Recent Labs Lab 06/23/16 1544  TROPIPOC 0.03     BNP Recent Labs Lab 06/23/16 1510  BNP 424.4*     DDimer  Recent Labs Lab 06/23/16 1510  DDIMER <0.27     Radiology    Dg Chest 2   View  Result Date: 06/23/2016 CLINICAL DATA:  Chest pain and shortness of breath, acute onset. EXAM: CHEST  2 VIEW COMPARISON:  05/26/2016. FINDINGS: Trachea is midline. Heart is enlarged. Thoracic aorta is calcified. Linear scarring in the lingula. Lungs are otherwise clear. No pleural fluid. IMPRESSION: No acute findings. Electronically Signed   By: Lorin Picket M.D.   On: 06/23/2016 15:56    Cardiac Studies    Patient Profile     71 y.o. female hx of nonobstructive CAD, apical HCM, diastolic dysfunction PAF, HTN and COPD  Presented with chst tightness and SOB  Peak troponin 0.1  Hypoxic on admit  Assessment & Plan    1  Chest tightness, SOB   Echo yesterday with severe pulmonary HTN  REcomm R heart cath to evaluate  2  PAF  REmains in SR  Holding anticoagulation for now  3  HTN  Follow    4  COPD  R heart cath today   Signed, Dorris Carnes, MD  06/25/2016, 11:16 AM

## 2016-06-26 ENCOUNTER — Encounter (HOSPITAL_COMMUNITY): Payer: Self-pay | Admitting: Cardiovascular Disease

## 2016-06-26 DIAGNOSIS — Z7901 Long term (current) use of anticoagulants: Secondary | ICD-10-CM

## 2016-06-26 DIAGNOSIS — R0600 Dyspnea, unspecified: Secondary | ICD-10-CM

## 2016-06-26 DIAGNOSIS — I270 Primary pulmonary hypertension: Principal | ICD-10-CM

## 2016-06-26 LAB — BASIC METABOLIC PANEL
ANION GAP: 12 (ref 5–15)
BUN: 19 mg/dL (ref 6–20)
CALCIUM: 8.7 mg/dL — AB (ref 8.9–10.3)
CO2: 21 mmol/L — AB (ref 22–32)
Chloride: 103 mmol/L (ref 101–111)
Creatinine, Ser: 1.26 mg/dL — ABNORMAL HIGH (ref 0.44–1.00)
GFR calc Af Amer: 48 mL/min — ABNORMAL LOW (ref >60)
GFR calc non Af Amer: 42 mL/min — ABNORMAL LOW (ref >60)
GLUCOSE: 94 mg/dL (ref 65–99)
POTASSIUM: 3.8 mmol/L (ref 3.5–5.1)
Sodium: 136 mmol/L (ref 135–145)

## 2016-06-26 LAB — CBC
HEMATOCRIT: 42.6 % (ref 36.0–46.0)
Hemoglobin: 13.9 g/dL (ref 12.0–15.0)
MCH: 28.8 pg (ref 26.0–34.0)
MCHC: 32.6 g/dL (ref 30.0–36.0)
MCV: 88.4 fL (ref 78.0–100.0)
Platelets: 265 10*3/uL (ref 150–400)
RBC: 4.82 MIL/uL (ref 3.87–5.11)
RDW: 16.7 % — ABNORMAL HIGH (ref 11.5–15.5)
WBC: 9.5 10*3/uL (ref 4.0–10.5)

## 2016-06-26 MED ORDER — FUROSEMIDE 20 MG PO TABS: 20.0000 mg | ORAL_TABLET | Freq: Every day | ORAL | Status: DC

## 2016-06-26 MED ORDER — BISACODYL 10 MG RE SUPP
10.0000 mg | Freq: Once | RECTAL | Status: DC
  Filled 2016-06-26: qty 1

## 2016-06-26 MED ORDER — CYCLOBENZAPRINE HCL 10 MG PO TABS
5.0000 mg | ORAL_TABLET | Freq: Three times a day (TID) | ORAL | Status: DC | PRN
  Administered 2016-06-26: 5 mg via ORAL
  Filled 2016-06-26 (×2): qty 1

## 2016-06-26 MED ORDER — CYCLOBENZAPRINE HCL 5 MG PO TABS: 5.0000 mg | ORAL_TABLET | Freq: Three times a day (TID) | ORAL | 0 refills | Status: DC | PRN

## 2016-06-26 NOTE — Progress Notes (Signed)
SATURATION QUALIFICATIONS: (This note is used to comply with regulatory documentation for home oxygen)  Patient Saturations on Room Air at Rest = 90%  Patient Saturations on Room Air while Ambulating =87%  Patient Saturations on 2 Liters of oxygen while Ambulating = 90%  Please briefly explain why patient needs home oxygen: Pts 02 sat drops without supplimental 02, remains in mid 90s at rest with 02 2 liters BNC.

## 2016-06-26 NOTE — Care Management Note (Addendum)
Case Management Note  Patient Details  Name: Amy [name] MRN: [id number] Date of Birth: 1945-10-02  Subjective/Objective:  Pt presented for Chest Pain. Pt is from home alone. Per pt she will have her daughter be able to stay with her once d/c. Pt has DME CPAP via [place] and wants to get additional DME from [facility]. Liaison aware to deliver 02, 3n1, RW to patients room.                Action/Plan: CM did provide list to patient in regards to [place]. Pt chose [facility] for Services. CM did make referral and SOC to begin within 24-48 hours post d/c. No further needs from CM at this time.   Expected Discharge Date:  06/26/16               Expected Discharge Plan:  [place]  In-House Referral:  NA  Discharge planning Services  CM Consult  Post Acute Care Choice:  Durable Medical Equipment Choice offered to:  Patient  DME Arranged:  3-N-1, Oxygen, Walker rolling DME Agency:  [place]:  RN, PT, Nurse's Aide,OT [facility] Agency:  [place]  Status of Service:  Completed, signed off  If discussed at [place] of Stay Meetings, dates discussed:    Additional Comments:  Bethena Roys, RN 06/26/2016, 4:02 PM

## 2016-06-26 NOTE — Progress Notes (Signed)
   06/26/16 1350  Mobility  Ambulation Response Tolerated poorly (During ambulation Pt was exasperated and had back stiffness)   Pt O2 Sat% was 80-87 during ambulation.

## 2016-06-26 NOTE — Progress Notes (Signed)
Pt d/c to home via w/c, education complete, condition stable. Edward Qualia RN

## 2016-06-26 NOTE — Discharge Instructions (Signed)
Shortness of Breath, Adult Shortness of breath is when a person has trouble breathing enough air, or when a person feels like she or he is having trouble breathing in enough air. Shortness of breath could be a sign of medical problem. Follow these instructions at home: Pay attention to any changes in your symptoms. Take these actions to help with your condition:  Do not smoke. Smoking is a common cause of shortness of breath. If you smoke and you need help quitting, ask your health care provider.  Avoid things that can irritate your airways, such as:  Mold.  Dust.  Air pollution.  Chemical fumes.  Things that can cause allergy symptoms (allergens), if you have allergies.  Keep your living space clean and free of mold and dust.  Rest as needed. Slowly return to your usual activities.  Take over-the-counter and prescription medicines, including oxygen and inhaled medicines, only as told by your health care provider.  Keep all follow-up visits as told by your health care provider. This is important. Contact a health care provider if:  Your condition does not improve as soon as expected.  You have a hard time doing your normal activities, even after you rest.  You have new symptoms. Get help right away if:  Your shortness of breath gets worse.  You have shortness of breath when you are resting.  You feel light-headed or you faint.  You have a cough that is not controlled with medicines.  You cough up blood.  You have pain with breathing.  You have pain in your chest, arms, shoulders, or abdomen.  You have a fever.  You cannot walk up stairs or exercise the way that you normally do. This information is not intended to replace advice given to you by your health care provider. Make sure you discuss any questions you have with your health care provider. Document Released: 12/17/2000 Document Revised: 10/13/2015 Document Reviewed: 08/30/2015 Elsevier Interactive Patient  Education  2017 [employer].

## 2016-06-26 NOTE — Progress Notes (Signed)
RT placed patient on CPAP with 2L O2 Bled into circuit. Patient resting comfortably at this time with no respiratory distress noted. RT will monitor as needed.

## 2016-06-26 NOTE — Progress Notes (Signed)
Progress Note  Patient Name: Amy [name] Date of Encounter: 06/26/2016  Primary Cardiologist: Klein/Turner    Subjective   Stiff from bed  Back hurts  No CP  Breathig OK on oxygen    Inpatient Medications    Scheduled Meds: . atorvastatin  80 mg Oral Q lunch  . dronedarone  400 mg Oral BID WC  . ezetimibe  10 mg Oral Daily  . furosemide  20 mg Oral Daily  . furosemide  20 mg Oral Once  . levothyroxine  100 mcg Oral QAC breakfast  . metoprolol tartrate  12.5 mg Oral BID  . pantoprazole  40 mg Oral Daily  . sodium chloride flush  3 mL Intravenous Q12H  . sodium chloride flush  3 mL Intravenous Q12H   Continuous Infusions: . sodium chloride     PRN Meds: sodium chloride, sodium chloride, acetaminophen, levalbuterol, morphine injection, nitroGLYCERIN, ondansetron (ZOFRAN) IV, sodium chloride flush, sodium chloride flush, technetium TC 17M diethylenetriame-pentaacetic acid, zolpidem   Vital Signs    Vitals:   06/25/16 1536 06/25/16 2200 06/25/16 2350 06/26/16 0500  BP:  106/70  (!) 89/57  Pulse:  73 (!) 56 66  Resp:  20 17 (!) 28  Temp: 98.3 F (36.8 C) 97.5 F (36.4 C)  97.6 F (36.4 C)  TempSrc: Oral Axillary  Oral  SpO2:  93% 94% 100%  Weight:    193 lb 12.8 oz (87.9 kg)  Height:        Intake/Output Summary (Last 24 hours) at 06/26/16 1015 Last data filed at 06/26/16 0600  Gross per 24 hour  Intake              222 ml  Output              800 ml  Net             -578 ml   Filed Weights   06/23/16 2118 06/25/16 0428 06/26/16 0500  Weight: 197 lb 4.8 oz (89.5 kg) 193 lb 6.4 oz (87.7 kg) 193 lb 12.8 oz (87.9 kg)    Telemetry    SR   - Personally Reviewed  ECG      Physical Exam   GEN: No acute distress.   Neck: No JVD Cardiac: RRR, no murmurs, rubs, or gallops.  Respiratory: Clear to auscultation bilaterally. GI: Soft, nontender, non-distended  MS: No edema; No deformity. Neuro:  Nonfocal  Psych: Normal affect   Labs      Chemistry Recent Labs Lab 06/23/16 1510 06/24/16 0305 06/26/16 0307  NA 141 141 136  K 4.1 4.1 3.8  CL 111 107 103  CO2 20* 24 21*  GLUCOSE 93 67 94  BUN _0 CREATININE 1.41* 1.30* 1.26*  CALCIUM 9.0 9.1 8.7*  PROT  --  6.6  --   ALBUMIN  --  3.5  --   AST  --  25  --   ALT  --  38  --   ALKPHOS  --  102  --   BILITOT  --  1.0  --   GFRNONAA 37* 40* 42*  GFRAA 42* 47* 48*  ANIONGAP _1 Hematology Recent Labs Lab 06/23/16 1510 06/24/16 0305 06/26/16 0307  WBC 8.0 8.5 9.5  RBC 4.61 4.32 4.82  HGB 13.4 12.5 13.9  HCT 41.5 38.7 42.6  MCV 90.0 89.6 88.4  MCH 29.1 28.9 28.8  MCHC 32.3 32.3 32.6  RDW 17.0*  17.1* 16.7*  PLT 280 268 265    Cardiac Enzymes Recent Labs Lab 06/23/16 2100 06/23/16 2331 06/24/16 0305  TROPONINI 0.08* 0.08* 0.10*    Recent Labs Lab 06/23/16 1544  TROPIPOC 0.03     BNP Recent Labs Lab 06/23/16 1510  BNP 424.4*     DDimer  Recent Labs Lab 06/23/16 1510  DDIMER <0.27     Radiology    Dg Chest 2 View  Result Date: 06/25/2016 CLINICAL DATA:  Follow up lung scan. Pt c/o SOB. Hx of CAD, COPD, AND HTN. Pt is a former smoker. EXAM: CHEST  2 VIEW COMPARISON:  Chest x-rays dated 06/23/2016 in 05/2017 2018. FINDINGS: Cardiomegaly is stable. Atherosclerotic changes again noted at the aortic arch. Lungs are again mildly hyperexpanded. Suspect chronic bronchitic changes centrally. Mild scarring again noted with thin the left lower lung. No new lung findings. No pleural effusion or pneumothorax seen. Mild degenerative spurring within the thoracic spine. No acute or suspicious osseous finding. IMPRESSION: 1. No active cardiopulmonary disease. No evidence of pneumonia or pulmonary edema. 2. Mildly hyperexpanded lungs, compatible with the given history of COPD. Probable associated chronic bronchitic changes centrally. 3. Cardiomegaly, stable. 4. Aortic atherosclerosis. Electronically Signed   By: Franki Cabot M.D.   On:  06/25/2016 21:31   Nm Pulmonary Perf And Vent  Result Date: 06/25/2016 CLINICAL DATA:  Shortness of breath for 3 days. EXAM: NUCLEAR MEDICINE VENTILATION - PERFUSION LUNG SCAN TECHNIQUE: Ventilation images were obtained in multiple projections using inhaled aerosol Tc-42mDTPA. Perfusion images were obtained in multiple projections after intravenous injection of Tc-930mAA. RADIOPHARMACEUTICALS:  30.1 mCi Technetium-9943mPA aerosol inhalation and 4.1 mCi Technetium-27m40m IV COMPARISON:  None. FINDINGS: Ventilation: Poor ventilation with central clumping of radiotracer, suggesting bronchitic changes and/or COPD. Perfusion: No wedge shaped peripheral perfusion defects to suggest acute pulmonary embolism. IMPRESSION: 1. No evidence of pulmonary embolism. 2. Poor ventilation throughout both lungs with central clumping of the radiotracer suggesting bronchitic changes (acute or chronic) and/or COPD. Electronically Signed   By: StanFranki Cabot.   On: 06/25/2016 20:16    Cardiac Studies   See CV section for R Heart cath results    Pulmonary HTN with PA pressure 69/23 mean 38   Trans-pulmonic gradient 30 mmHg  PVR 7.3 Woods Units by Fick (CO 4.15)  PVR 9.6 Woods Units by Thermodilution (CO 3.14)  Hemo Data    Most Recent Value  Fick Cardiac Output 4.15 L/min  Fick Cardiac Output Index 2.17 (L/min)/BSA  Thermal Cardiac Output 3.14 L/min  Thermal Cardiac Output Index 1.64 (L/min)/BSA  RA A Wave 9 mmHg  RA V Wave 4 mmHg  RA Mean 4 mmHg  RV Systolic Pressure 69 mmHg  RV Diastolic Pressure 1 mmHg  RV EDP 4 mmHg  PA Systolic Pressure 69 mmHg  PA Diastolic Pressure 23 mmHg  PA Mean 38 mmHg  PW A Wave 11 mmHg  PW V Wave 8 mmHg  PW Mean 8 mmHg  TPVR Index 23.11 HRUI      Patient Profile       Assessment & Plan    1  Pulmonary HTN    REcieved 1 dose lasix on admit   Cath yesterday showed progression of pulmonary HTN  V/Q scan yesterday negative   Reviewed with D McLean  Would  recomm trial of  Opsumit  Will prob need to get as outpt    Pt now on O2  Sats drop into 80s with walking  WIll need O2 at home  Continue CPAP 2  HCM  Apical variant  3  PAF  Continue Xarelto and Multaq  Maintaining SR   3  COPD  Follows with M Wert    5  Back spasm  Written for 1 Flexeril   Signed, Dorris Carnes, MD  06/26/2016, 10:15 AM

## 2016-06-26 NOTE — Discharge Summary (Signed)
Physician Discharge Summary  Amy SYLVIA AQT:[id number] DOB: 13-May-1945 DOA: 06/23/2016  PCP: Amy Nip, MD  Admit date: 06/23/2016 Discharge date: 06/26/2016   Recommendations for Outpatient Follow-Up:   Home O2 Home health  Discharge Diagnosis:   Principal Problem:   Chest pain Active Problems:   Hypothyroidism   Essential hypertension   Apical variant hypertrophic cardiomyopathy (HCC)   Chronic diastolic heart failure (HCC)   PAF (paroxysmal atrial fibrillation) (HCC)   OSA (obstructive sleep apnea)   Hypoxia   Pulmonary hypertension   Chronic anticoagulation   Chronic renal insufficiency, stage III (moderate)   Dyspnea   Pulmonary hypertension, primary [facility])   Discharge disposition:  Home:  Discharge Condition: Improved.  Diet recommendation: Low sodium, heart healthy  Wound care: None.   History of Present Illness:   Amy [name] is a 71 y.o. 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  She was due to see Dr. Melvyn [name] in clinic this afternoon.  After her testing, she went to [place] for lunch with her daughter.  While there, she began to notice that she was not feeling well. She developed substernal chest pressure, nonradiating, associated with shortness of breath.  She vomited in the parking lot at [place], which made her feel a bit better.  The intensity of the chest pressure improved.  She actually drove herself to Dr. Gustavus [name] office while still having active chest pain.  She reported her symptoms upon arrival.  EMS was called for emergent transfer to the ED.  She received 1 SL NTG en route.  Chest pain resolved after about three hours.  However, patient  found to have low O2 saturations on RA in the ED (86-87%), which is new.  No active wheezing.   Hospital Course by Problem:   Pulmonary HTN     -Cath yesterday showed progression of pulmonary HTN  V/Q scan  negative   Follow up with Amy [name]   -outpatient trial of  Opsumit  Acute resp failure due to pulm HTN Pt now on O2  Sats drop into 80s with walking  WIll need O2 at home  Continue CPAP QHS   HCM  Apical variant per cardiology  PAF  Continue Xarelto and Multaq  Maintaining SR   COPD   -Follows with Dr. Melvyn [name]    Back spasm   -short course of steroids    Medical Consultants:    cards   Discharge Exam:   Vitals:   06/26/16 0500 06/26/16 1445  BP: (!) 89/57 102/61  Pulse: 66 68  Resp: (!) 28   Temp: 97.6 F (36.4 C) 97.6 F (36.4 C)   Vitals:   06/25/16 2200 06/25/16 2350 06/26/16 0500 06/26/16 1445  BP: 106/70  (!) 89/57 102/61  Pulse: 73 (!) 56 66 68  Resp: 20 17 (!) 28   Temp: 97.5 F (36.4 C)  97.6 F (36.4 C) 97.6 F (36.4 C)  TempSrc: Axillary  Oral Oral  SpO2: 93% 94% 100% 93%  Weight:   87.9 kg (193 lb 12.8 oz)   Height:        Gen:  NAD- deconditioned    The results of significant diagnostics from this hospitalization (including imaging, microbiology, ancillary and laboratory)  are listed below for reference.     Procedures and Diagnostic Studies:   Dg Chest 2 View  Result Date: 06/23/2016 CLINICAL DATA:  Chest pain and shortness of breath, acute onset. EXAM: CHEST  2 VIEW COMPARISON:  05/26/2016. FINDINGS: Trachea is midline. Heart is enlarged. Thoracic aorta is calcified. Linear scarring in the lingula. Lungs are otherwise clear. No pleural fluid. IMPRESSION: No acute findings. Electronically Signed   By: Lorin Picket M.Amy.   On: 06/23/2016 15:56     Labs:   Basic Metabolic Panel:  Recent Labs Lab 06/23/16 1510 06/24/16 0305 06/26/16 0307  NA 141 141 136  K 4.1 4.1 3.8  CL 111 107 103  CO2 20* 24 21*  GLUCOSE 93 67  94  BUN _0 CREATININE 1.41* 1.30* 1.26*  CALCIUM 9.0 9.1 8.7*   GFR Estimated Creatinine Clearance: 43.1 mL/min (A) (by C-G formula based on SCr of 1.26 mg/dL (H)). Liver Function Tests:  Recent Labs Lab 06/24/16 0305  AST 25  ALT 38  ALKPHOS 102  BILITOT 1.0  PROT 6.6  ALBUMIN 3.5   No results for input(s): LIPASE, AMYLASE in the last 168 hours. No results for input(s): AMMONIA in the last 168 hours. Coagulation profile  Recent Labs Lab 06/25/16 0603  INR 1.82    CBC:  Recent Labs Lab 06/23/16 1510 06/24/16 0305 06/26/16 0307  WBC 8.0 8.5 9.5  HGB 13.4 12.5 13.9  HCT 41.5 38.7 42.6  MCV 90.0 89.6 88.4  PLT 280 268 265   Cardiac Enzymes:  Recent Labs Lab 06/23/16 2100 06/23/16 2331 06/24/16 0305  TROPONINI 0.08* 0.08* 0.10*   BNP: Invalid input(s): POCBNP CBG: No results for input(s): GLUCAP in the last 168 hours. Amy-Dimer  Recent Labs  06/23/16 1510  DDIMER <0.27   Hgb A1c No results for input(s): HGBA1C in the last 72 hours. Lipid Profile No results for input(s): CHOL, HDL, LDLCALC, TRIG, CHOLHDL, LDLDIRECT in the last 72 hours. Thyroid function studies No results for input(s): TSH, T4TOTAL, T3FREE, THYROIDAB in the last 72 hours.  Invalid input(s): FREET3 Anemia work up No results for input(s): VITAMINB12, FOLATE, FERRITIN, TIBC, IRON, RETICCTPCT in the last 72 hours. Microbiology No results found for this or any previous visit (from the past 240 hour(s)).   Discharge Instructions:   Discharge Instructions    Diet - low sodium heart healthy    Complete by:  As directed    Discharge instructions    Complete by:  As directed    Close follow up with Dr. Aundra [name] for pulm HTN Home O2   Increase activity slowly    Complete by:  As directed      Allergies as of 06/26/2016      Reactions   Amiodarone    Dyspnea - felt to be 2/2 amio lung toxicity Has tolerated Omnipaque    Zyban [bupropion] Itching      Medication List     TAKE these medications   albuterol 108 (90 Base) MCG/ACT inhaler Commonly known as:  PROVENTIL HFA;VENTOLIN HFA Inhale 2 puffs into the lungs every 6 (six) hours as needed for wheezing or shortness of breath.   atorvastatin 80 MG tablet Commonly known as:  LIPITOR Take 1 tablet (80 mg total) by mouth daily with lunch.   colchicine 0.6 MG tablet Take 0.6 mg by mouth daily.   cyclobenzaprine 5 MG tablet Commonly known as:  FLEXERIL Take 1 tablet (5 mg total) by mouth 3 (three) times  daily as needed for muscle spasms.   ezetimibe 10 MG tablet Commonly known as:  ZETIA TAKE 1 TABLET (10 MG TOTAL) BY MOUTH DAILY.   furosemide 20 MG tablet Commonly known as:  LASIX Take 1 tablet (20 mg total) by mouth daily.   Glycopyrrolate-Formoterol 9-4.8 MCG/ACT Aero Commonly known as:  BEVESPI AEROSPHERE Inhale 2 puffs into the lungs 2 (two) times daily. What changed:  how much to take   levothyroxine 100 MCG tablet Commonly known as:  SYNTHROID, LEVOTHROID Take 100 mcg by mouth daily before breakfast.   metoprolol tartrate 25 MG tablet Commonly known as:  LOPRESSOR Take 0.5 tablets (12.5 mg total) by mouth 2 (two) times daily.   MULTAQ 400 MG tablet Generic drug:  dronedarone TAKE 1 TABLET BY MOUTH TWICE A DAY WITH A MEAL   multivitamin capsule Take 1 capsule by mouth daily.   nitroGLYCERIN 0.4 MG SL tablet Commonly known as:  NITROSTAT Place 1 tablet (0.4 mg total) under the tongue every 5 (five) minutes as needed for chest pain.   pantoprazole 40 MG tablet Commonly known as:  PROTONIX TAKE 1 TABLET (40 MG TOTAL) BY MOUTH DAILY.   rivaroxaban 20 MG Tabs tablet Commonly known as:  XARELTO TAKE 1 TABLET BY MOUTH EVERY DAY   UNABLE TO FIND Med Name: CPAP  DME-[facility]   Vitamin Amy 2000 units Caps Take 2,000 Units by mouth daily.   zolpidem 5 MG tablet Commonly known as:  AMBIEN Take 2.5 mg by mouth at bedtime as needed for sleep.            Durable Medical Equipment         Start     Ordered   06/26/16 1314  For home use only DME oxygen  Once    Question Answer Comment  Mode or (Route) Nasal cannula   Liters per Minute 2   Frequency Continuous (stationary and portable oxygen unit needed)   Oxygen delivery system Gas      06/26/16 1313     Follow-up Information    [name], MD Follow up on 07/23/2016.   Specialty:  Cardiology Why:  at 11:40am for an appt. with Dr. Aundra [name] in the CHF clinic. We will mail you a new patient packet. Also, please arrive 30 min prior to your appt. time. Garage code is [id number]# Contact information: [place] [address] [place], [place] Follow up in 1 week(s).   Specialty:  Family Medicine Contact information: [place] [address] [phone]            Time coordinating discharge: 35 min  Signed:  Jasara Corrigan Alison Stalling   [facility] 06/26/2016, 3:03 PM

## 2016-06-27 ENCOUNTER — Other Ambulatory Visit: Payer: Self-pay | Admitting: Cardiology

## 2016-07-01 DIAGNOSIS — I2510 Atherosclerotic heart disease of native coronary artery without angina pectoris: Secondary | ICD-10-CM | POA: Diagnosis not present

## 2016-07-01 DIAGNOSIS — M62830 Muscle spasm of back: Secondary | ICD-10-CM | POA: Diagnosis not present

## 2016-07-01 DIAGNOSIS — I272 Pulmonary hypertension, unspecified: Secondary | ICD-10-CM | POA: Diagnosis not present

## 2016-07-01 DIAGNOSIS — E039 Hypothyroidism, unspecified: Secondary | ICD-10-CM | POA: Diagnosis not present

## 2016-07-01 DIAGNOSIS — G4733 Obstructive sleep apnea (adult) (pediatric): Secondary | ICD-10-CM | POA: Diagnosis not present

## 2016-07-01 DIAGNOSIS — J449 Chronic obstructive pulmonary disease, unspecified: Secondary | ICD-10-CM | POA: Diagnosis not present

## 2016-07-01 DIAGNOSIS — I509 Heart failure, unspecified: Secondary | ICD-10-CM | POA: Diagnosis not present

## 2016-07-01 DIAGNOSIS — I5032 Chronic diastolic (congestive) heart failure: Secondary | ICD-10-CM | POA: Diagnosis not present

## 2016-07-01 DIAGNOSIS — I422 Other hypertrophic cardiomyopathy: Secondary | ICD-10-CM | POA: Diagnosis not present

## 2016-07-01 DIAGNOSIS — E785 Hyperlipidemia, unspecified: Secondary | ICD-10-CM | POA: Diagnosis not present

## 2016-07-01 DIAGNOSIS — G4730 Sleep apnea, unspecified: Secondary | ICD-10-CM | POA: Diagnosis not present

## 2016-07-01 DIAGNOSIS — Z87891 Personal history of nicotine dependence: Secondary | ICD-10-CM | POA: Diagnosis not present

## 2016-07-01 DIAGNOSIS — J3489 Other specified disorders of nose and nasal sinuses: Secondary | ICD-10-CM | POA: Diagnosis not present

## 2016-07-01 DIAGNOSIS — N183 Chronic kidney disease, stage 3 (moderate): Secondary | ICD-10-CM | POA: Diagnosis not present

## 2016-07-01 DIAGNOSIS — I480 Paroxysmal atrial fibrillation: Secondary | ICD-10-CM | POA: Diagnosis not present

## 2016-07-02 DIAGNOSIS — I272 Pulmonary hypertension, unspecified: Secondary | ICD-10-CM | POA: Diagnosis not present

## 2016-07-02 DIAGNOSIS — I480 Paroxysmal atrial fibrillation: Secondary | ICD-10-CM | POA: Diagnosis not present

## 2016-07-02 DIAGNOSIS — I2510 Atherosclerotic heart disease of native coronary artery without angina pectoris: Secondary | ICD-10-CM | POA: Diagnosis not present

## 2016-07-02 DIAGNOSIS — N183 Chronic kidney disease, stage 3 (moderate): Secondary | ICD-10-CM | POA: Diagnosis not present

## 2016-07-02 DIAGNOSIS — I5032 Chronic diastolic (congestive) heart failure: Secondary | ICD-10-CM | POA: Diagnosis not present

## 2016-07-02 DIAGNOSIS — I422 Other hypertrophic cardiomyopathy: Secondary | ICD-10-CM | POA: Diagnosis not present

## 2016-07-03 DIAGNOSIS — I5032 Chronic diastolic (congestive) heart failure: Secondary | ICD-10-CM | POA: Diagnosis not present

## 2016-07-03 DIAGNOSIS — I422 Other hypertrophic cardiomyopathy: Secondary | ICD-10-CM | POA: Diagnosis not present

## 2016-07-03 DIAGNOSIS — I480 Paroxysmal atrial fibrillation: Secondary | ICD-10-CM | POA: Diagnosis not present

## 2016-07-03 DIAGNOSIS — I2510 Atherosclerotic heart disease of native coronary artery without angina pectoris: Secondary | ICD-10-CM | POA: Diagnosis not present

## 2016-07-03 DIAGNOSIS — N183 Chronic kidney disease, stage 3 (moderate): Secondary | ICD-10-CM | POA: Diagnosis not present

## 2016-07-03 DIAGNOSIS — I272 Pulmonary hypertension, unspecified: Secondary | ICD-10-CM | POA: Diagnosis not present

## 2016-07-04 DIAGNOSIS — I272 Pulmonary hypertension, unspecified: Secondary | ICD-10-CM | POA: Diagnosis not present

## 2016-07-04 DIAGNOSIS — I5032 Chronic diastolic (congestive) heart failure: Secondary | ICD-10-CM | POA: Diagnosis not present

## 2016-07-04 DIAGNOSIS — I480 Paroxysmal atrial fibrillation: Secondary | ICD-10-CM | POA: Diagnosis not present

## 2016-07-04 DIAGNOSIS — N183 Chronic kidney disease, stage 3 (moderate): Secondary | ICD-10-CM | POA: Diagnosis not present

## 2016-07-04 DIAGNOSIS — I2510 Atherosclerotic heart disease of native coronary artery without angina pectoris: Secondary | ICD-10-CM | POA: Diagnosis not present

## 2016-07-04 DIAGNOSIS — I422 Other hypertrophic cardiomyopathy: Secondary | ICD-10-CM | POA: Diagnosis not present

## 2016-07-07 DIAGNOSIS — I422 Other hypertrophic cardiomyopathy: Secondary | ICD-10-CM | POA: Diagnosis not present

## 2016-07-07 DIAGNOSIS — I2510 Atherosclerotic heart disease of native coronary artery without angina pectoris: Secondary | ICD-10-CM | POA: Diagnosis not present

## 2016-07-07 DIAGNOSIS — N183 Chronic kidney disease, stage 3 (moderate): Secondary | ICD-10-CM | POA: Diagnosis not present

## 2016-07-07 DIAGNOSIS — I480 Paroxysmal atrial fibrillation: Secondary | ICD-10-CM | POA: Diagnosis not present

## 2016-07-07 DIAGNOSIS — I272 Pulmonary hypertension, unspecified: Secondary | ICD-10-CM | POA: Diagnosis not present

## 2016-07-07 DIAGNOSIS — I5032 Chronic diastolic (congestive) heart failure: Secondary | ICD-10-CM | POA: Diagnosis not present

## 2016-07-08 DIAGNOSIS — I272 Pulmonary hypertension, unspecified: Secondary | ICD-10-CM | POA: Diagnosis not present

## 2016-07-08 DIAGNOSIS — I422 Other hypertrophic cardiomyopathy: Secondary | ICD-10-CM | POA: Diagnosis not present

## 2016-07-08 DIAGNOSIS — N183 Chronic kidney disease, stage 3 (moderate): Secondary | ICD-10-CM | POA: Diagnosis not present

## 2016-07-08 DIAGNOSIS — I5032 Chronic diastolic (congestive) heart failure: Secondary | ICD-10-CM | POA: Diagnosis not present

## 2016-07-08 DIAGNOSIS — I2510 Atherosclerotic heart disease of native coronary artery without angina pectoris: Secondary | ICD-10-CM | POA: Diagnosis not present

## 2016-07-08 DIAGNOSIS — I480 Paroxysmal atrial fibrillation: Secondary | ICD-10-CM | POA: Diagnosis not present

## 2016-07-10 ENCOUNTER — Telehealth: Payer: Self-pay | Admitting: Cardiology

## 2016-07-10 DIAGNOSIS — I5032 Chronic diastolic (congestive) heart failure: Secondary | ICD-10-CM | POA: Diagnosis not present

## 2016-07-10 DIAGNOSIS — I2510 Atherosclerotic heart disease of native coronary artery without angina pectoris: Secondary | ICD-10-CM | POA: Diagnosis not present

## 2016-07-10 DIAGNOSIS — N183 Chronic kidney disease, stage 3 (moderate): Secondary | ICD-10-CM | POA: Diagnosis not present

## 2016-07-10 DIAGNOSIS — I272 Pulmonary hypertension, unspecified: Secondary | ICD-10-CM | POA: Diagnosis not present

## 2016-07-10 DIAGNOSIS — I480 Paroxysmal atrial fibrillation: Secondary | ICD-10-CM | POA: Diagnosis not present

## 2016-07-10 DIAGNOSIS — I422 Other hypertrophic cardiomyopathy: Secondary | ICD-10-CM | POA: Diagnosis not present

## 2016-07-10 NOTE — Telephone Encounter (Signed)
Please have patient check BP and HR next time she has palpitations and call with results.

## 2016-07-10 NOTE — Telephone Encounter (Signed)
Patient states she had an episode Tuesday where her heart was racing. She was taking a nap and the racing woke her up. She reports her FitBit readings were in the 60s-70s, but she knows her heart was racing because she could both feel it and see it in her chest. She took one NTG tablet and the racing did not resolve. After an hour, she took a dose of Lopressor 25 mg. The heart racing stopped shortly after and has not returned. The patient states she had no other symptoms. She denies SOB and CP.  She states she recently purchases a home BP monitor and her BP usually ranges 39-265 systolic.   Educated the patient on the use of NTG. She understands not to take NTG for racing HR - that she is only to take for CP.  She understands to call next time her heart is racing to discuss medications to take given her BP runs a little low. She agrees with treatment plan.  She understands she will be called only if Dr. Radford Pax has further instructions.

## 2016-07-10 NOTE — Telephone Encounter (Signed)
New Message     Pt was experiencing a high heart rate Tuesday and took a Nitro and it did not go away, she then took a bp pill and it went away and has not had a problem since

## 2016-07-16 DIAGNOSIS — N183 Chronic kidney disease, stage 3 (moderate): Secondary | ICD-10-CM | POA: Diagnosis not present

## 2016-07-16 DIAGNOSIS — I2510 Atherosclerotic heart disease of native coronary artery without angina pectoris: Secondary | ICD-10-CM | POA: Diagnosis not present

## 2016-07-16 DIAGNOSIS — I272 Pulmonary hypertension, unspecified: Secondary | ICD-10-CM | POA: Diagnosis not present

## 2016-07-16 DIAGNOSIS — I5032 Chronic diastolic (congestive) heart failure: Secondary | ICD-10-CM | POA: Diagnosis not present

## 2016-07-16 DIAGNOSIS — I422 Other hypertrophic cardiomyopathy: Secondary | ICD-10-CM | POA: Diagnosis not present

## 2016-07-16 DIAGNOSIS — I480 Paroxysmal atrial fibrillation: Secondary | ICD-10-CM | POA: Diagnosis not present

## 2016-07-17 NOTE — Telephone Encounter (Signed)
Called patient to follow-up. She states her heart hasn't had any more racing episodes since the last time she called. Scheduled patient for follow-up with Dr. Radford Pax 5/15 since she is seeing Dr. Aundra Dubin next week. She understands to check BP and HR and to call if heart races again. She was grateful for call and agrees with treatment plan.

## 2016-07-18 DIAGNOSIS — I272 Pulmonary hypertension, unspecified: Secondary | ICD-10-CM | POA: Diagnosis not present

## 2016-07-18 DIAGNOSIS — N183 Chronic kidney disease, stage 3 (moderate): Secondary | ICD-10-CM | POA: Diagnosis not present

## 2016-07-18 DIAGNOSIS — I5032 Chronic diastolic (congestive) heart failure: Secondary | ICD-10-CM | POA: Diagnosis not present

## 2016-07-18 DIAGNOSIS — I422 Other hypertrophic cardiomyopathy: Secondary | ICD-10-CM | POA: Diagnosis not present

## 2016-07-18 DIAGNOSIS — I480 Paroxysmal atrial fibrillation: Secondary | ICD-10-CM | POA: Diagnosis not present

## 2016-07-18 DIAGNOSIS — I2510 Atherosclerotic heart disease of native coronary artery without angina pectoris: Secondary | ICD-10-CM | POA: Diagnosis not present

## 2016-07-21 DIAGNOSIS — I2510 Atherosclerotic heart disease of native coronary artery without angina pectoris: Secondary | ICD-10-CM | POA: Diagnosis not present

## 2016-07-21 DIAGNOSIS — I5032 Chronic diastolic (congestive) heart failure: Secondary | ICD-10-CM | POA: Diagnosis not present

## 2016-07-21 DIAGNOSIS — N183 Chronic kidney disease, stage 3 (moderate): Secondary | ICD-10-CM | POA: Diagnosis not present

## 2016-07-21 DIAGNOSIS — I422 Other hypertrophic cardiomyopathy: Secondary | ICD-10-CM | POA: Diagnosis not present

## 2016-07-21 DIAGNOSIS — I272 Pulmonary hypertension, unspecified: Secondary | ICD-10-CM | POA: Diagnosis not present

## 2016-07-21 DIAGNOSIS — I480 Paroxysmal atrial fibrillation: Secondary | ICD-10-CM | POA: Diagnosis not present

## 2016-07-22 DIAGNOSIS — I422 Other hypertrophic cardiomyopathy: Secondary | ICD-10-CM | POA: Diagnosis not present

## 2016-07-22 DIAGNOSIS — N183 Chronic kidney disease, stage 3 (moderate): Secondary | ICD-10-CM | POA: Diagnosis not present

## 2016-07-22 DIAGNOSIS — I272 Pulmonary hypertension, unspecified: Secondary | ICD-10-CM | POA: Diagnosis not present

## 2016-07-22 DIAGNOSIS — I480 Paroxysmal atrial fibrillation: Secondary | ICD-10-CM | POA: Diagnosis not present

## 2016-07-22 DIAGNOSIS — I5032 Chronic diastolic (congestive) heart failure: Secondary | ICD-10-CM | POA: Diagnosis not present

## 2016-07-22 DIAGNOSIS — I2510 Atherosclerotic heart disease of native coronary artery without angina pectoris: Secondary | ICD-10-CM | POA: Diagnosis not present

## 2016-07-23 ENCOUNTER — Ambulatory Visit (HOSPITAL_COMMUNITY)
Admission: RE | Admit: 2016-07-23 | Discharge: 2016-07-23 | Disposition: A | Discharge status: Home / Self Care | Payer: Medicare Other | Source: Ambulatory Visit | Attending: Cardiology | Admitting: Cardiology

## 2016-07-23 ENCOUNTER — Encounter (HOSPITAL_COMMUNITY): Payer: Self-pay

## 2016-07-23 VITALS — BP 116/58 | HR 78 | Wt 196.5 lb

## 2016-07-23 DIAGNOSIS — J449 Chronic obstructive pulmonary disease, unspecified: Secondary | ICD-10-CM | POA: Diagnosis not present

## 2016-07-23 DIAGNOSIS — R0600 Dyspnea, unspecified: Secondary | ICD-10-CM

## 2016-07-23 DIAGNOSIS — I480 Paroxysmal atrial fibrillation: Secondary | ICD-10-CM | POA: Diagnosis not present

## 2016-07-23 DIAGNOSIS — Z79899 Other long term (current) drug therapy: Secondary | ICD-10-CM | POA: Diagnosis not present

## 2016-07-23 DIAGNOSIS — Z9981 Dependence on supplemental oxygen: Secondary | ICD-10-CM | POA: Insufficient documentation

## 2016-07-23 DIAGNOSIS — E785 Hyperlipidemia, unspecified: Secondary | ICD-10-CM | POA: Insufficient documentation

## 2016-07-23 DIAGNOSIS — Z7901 Long term (current) use of anticoagulants: Secondary | ICD-10-CM | POA: Insufficient documentation

## 2016-07-23 DIAGNOSIS — I2721 Secondary pulmonary arterial hypertension: Secondary | ICD-10-CM | POA: Insufficient documentation

## 2016-07-23 DIAGNOSIS — I25118 Atherosclerotic heart disease of native coronary artery with other forms of angina pectoris: Secondary | ICD-10-CM | POA: Diagnosis not present

## 2016-07-23 DIAGNOSIS — K219 Gastro-esophageal reflux disease without esophagitis: Secondary | ICD-10-CM | POA: Insufficient documentation

## 2016-07-23 DIAGNOSIS — I2510 Atherosclerotic heart disease of native coronary artery without angina pectoris: Secondary | ICD-10-CM | POA: Insufficient documentation

## 2016-07-23 DIAGNOSIS — G4733 Obstructive sleep apnea (adult) (pediatric): Secondary | ICD-10-CM | POA: Insufficient documentation

## 2016-07-23 DIAGNOSIS — I5032 Chronic diastolic (congestive) heart failure: Secondary | ICD-10-CM | POA: Diagnosis not present

## 2016-07-23 DIAGNOSIS — I4891 Unspecified atrial fibrillation: Secondary | ICD-10-CM | POA: Diagnosis not present

## 2016-07-23 DIAGNOSIS — E039 Hypothyroidism, unspecified: Secondary | ICD-10-CM | POA: Insufficient documentation

## 2016-07-23 DIAGNOSIS — I422 Other hypertrophic cardiomyopathy: Secondary | ICD-10-CM | POA: Diagnosis not present

## 2016-07-23 DIAGNOSIS — Z87891 Personal history of nicotine dependence: Secondary | ICD-10-CM | POA: Diagnosis not present

## 2016-07-23 DIAGNOSIS — I270 Primary pulmonary hypertension: Secondary | ICD-10-CM

## 2016-07-23 MED ORDER — MACITENTAN 10 MG PO TABS: 10.0000 mg | ORAL_TABLET | Freq: Every day | ORAL | 6 refills | Status: DC

## 2016-07-23 MED ORDER — FUROSEMIDE 20 MG PO TABS: 40.0000 mg | ORAL_TABLET | Freq: Every day | ORAL | 6 refills | Status: DC

## 2016-07-23 MED ORDER — MACITENTAN 10 MG PO TABS: 10.0000 mg | ORAL_TABLET | Freq: Every day | ORAL | 11 refills | Status: DC

## 2016-07-23 MED ORDER — POTASSIUM CHLORIDE CRYS ER 20 MEQ PO TBCR: 20.0000 meq | EXTENDED_RELEASE_TABLET | Freq: Every day | ORAL | 3 refills | Status: DC

## 2016-07-23 NOTE — Patient Instructions (Addendum)
6 Minute walk today.  EKG today.  Routine lab work today. Will notify you of abnormal results, otherwise no news is good news!  START Opsumit 10 mg tablet once daily. You will be contacted by the specialty pharmacy to confirm enrollment and shipment information.  INCREASE Lasix 40 mg (2 tabs) once daily.  START Potassium 20 meq tablet once daily.  Return in 10-14 days to repeat labs.  Follow up 3-4 weeks with Dr. Aundra Dubin.  ________________________________________________________  Amy [name] Code: [id number]

## 2016-07-24 DIAGNOSIS — I422 Other hypertrophic cardiomyopathy: Secondary | ICD-10-CM | POA: Diagnosis not present

## 2016-07-24 DIAGNOSIS — N183 Chronic kidney disease, stage 3 (moderate): Secondary | ICD-10-CM | POA: Diagnosis not present

## 2016-07-24 DIAGNOSIS — I272 Pulmonary hypertension, unspecified: Secondary | ICD-10-CM | POA: Diagnosis not present

## 2016-07-24 DIAGNOSIS — I5032 Chronic diastolic (congestive) heart failure: Secondary | ICD-10-CM | POA: Diagnosis not present

## 2016-07-24 DIAGNOSIS — I480 Paroxysmal atrial fibrillation: Secondary | ICD-10-CM | POA: Diagnosis not present

## 2016-07-24 DIAGNOSIS — I2510 Atherosclerotic heart disease of native coronary artery without angina pectoris: Secondary | ICD-10-CM | POA: Diagnosis not present

## 2016-07-24 LAB — HIV ANTIBODY (ROUTINE TESTING W REFLEX): HIV Screen 4th Generation wRfx: NONREACTIVE

## 2016-07-24 LAB — RHEUMATOID FACTOR: Rhuematoid fact SerPl-aCnc: 14.1 IU/mL — ABNORMAL HIGH (ref 0.0–13.9)

## 2016-07-24 LAB — ANTI-SCLERODERMA ANTIBODY: Scleroderma (Scl-70) (ENA) Antibody, IgG: <0.2 AI (ref 0.0–0.9)

## 2016-07-24 NOTE — Progress Notes (Signed)
PCP: Dr. Radene Ou Cardiology: Dr. Radford Pax HF Cardiology: Dr. Aundra Dubin  Amy [name] was referred by Dr. Radford Pax for evaluation of pulmonary hypertension and CHF.   71 yo with history of COPD, OSA on CPAP, apical hypertrophic cardiomyopathy, chronic diastolic CHF, and paroxysmal atrial fibrillation presents for evaluation of CHF and pulmonary hypertension.  Patient was admitted in 3/18 with CHF and dyspnea, found to have pulmonary arterial hypertension by RHC.  Prior to this, she has had a long history of PAF.  She is currently taking dronedarone but has had breakthrough fibrillation.  She is in NSR today.  She had an ablation in the past and is being evaluated for consideration of redo ablation. She has apical hypertrophic cardiomyopathy proven by cardiac MRI.   She has been short of breath with exertion for a long time.  She gets out of breath getting dressed or walking 150 feet.  No orthopnea/PND.  No chest pain.  She has been on home oxygen since her 3/18 hospitalization.   ECG: Personally reviewed.  Lateral and anterolateral TWIs  Labs (2/18): TSH normal Labs (3/18): K 3.8, creatinine 1.26, BNP 424  6 minute walk: 109 m  PMH:  1. COPD: Quit smoking 2011.  - PFTs (3/18): minimal obstruction, minimal restriction, DLCO 22% (severely decreased).  2. Hypothyroidism 3. Hyperlipidemia 4. OSA: Uses CPAP 5. Apical hypertrophic cardiomyopathy:  - Cardiac MRI (7/16) with EF 79%, mid-apical LV hypertrophy with spade-like ventricle, mid-myocardial LGE was noted at the apex.   6. GERD 7. Atrial fibrillation: Paroxysmal.    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  - Long QT interval so dofetilide and sotalol have not been used.  - She has had atrial fibrillation ablation.  - Currently on dronedarone.  8. Coronary artery disease: LHC (7/16) with nonobstructive disease + 85% ostial stenosis small D1.  9. Diastolic CHF: Echo (1/61) with EF 65-70%, apical hypertrophic cardiomyopathy, severe LAE, PASP 86  mmHg.  - RHC (3/18): mean RA 4, PA 69/23 mean 38, mean PCWP 8, CI 2.17, PVR 7.3 WU Fick, 9.6 WU thermo.  10. Pulmonary hypertension: See RHC above.  - V/Q scan negative 3/18 11. CPX (12/17): peak VO2 8.5, RER 0.96, VE/VCO2 slope 67. Submaximal but appears to show severe functional impairment.    Social History   Social History  . Marital status: Widowed    Spouse name: divorced.  . Number of children: 1  . Years of education: N/A   Occupational History  . retired. still works for [employer].     Social History Main Topics  . Smoking status: Former Smoker    Packs/day: 0.50    Years: 44.00    Types: Cigarettes    Quit date: 04/07/2009  . Smokeless tobacco: Never Used  . Alcohol use Yes     Comment: 2 to 3 glasses wine per week  . Drug use: No  . Sexual activity: No   Other Topics Concern  . Not on file   Social History Narrative   Pt lives alone with 2 dogs.    Family History  Problem Relation Age of Onset  . Heart disease Sister     Good Pastures Syndrome  . Gout Father   . Lung cancer Father     smoked  . Breast cancer Paternal Aunt    ROS: All systems reviewed and negative except as per HPI.   Current Outpatient Prescriptions  Medication Sig Dispense Refill  . albuterol (PROVENTIL HFA;VENTOLIN HFA) 108 (90 BASE) MCG/ACT inhaler Inhale 2 puffs  into the lungs every 6 (six) hours as needed for wheezing or shortness of breath.     Marland Kitchen atorvastatin (LIPITOR) 80 MG tablet Take 1 tablet (80 mg total) by mouth daily with lunch. 90 tablet 3  . Cholecalciferol (VITAMIN D) 2000 UNITS CAPS Take 2,000 Units by mouth daily.     . colchicine 0.6 MG tablet Take 0.6 mg by mouth daily.     . cyclobenzaprine (FLEXERIL) 5 MG tablet Take 1 tablet (5 mg total) by mouth 3 (three) times daily as needed for muscle spasms. 10 tablet 0  . ezetimibe (ZETIA) 10 MG tablet TAKE 1 TABLET (10 MG TOTAL) BY MOUTH DAILY. 90 tablet 3  . furosemide (LASIX) 20 MG tablet Take 2 tablets (40 mg total) by mouth  daily. 60 tablet 6  . Glycopyrrolate-Formoterol (BEVESPI AEROSPHERE) 9-4.8 MCG/ACT AERO Inhale 2 puffs into the lungs 2 (two) times daily. (Patient taking differently: Inhale 1 puff into the lungs 2 (two) times daily. ) 1 Inhaler 5  . levothyroxine (SYNTHROID, LEVOTHROID) 100 MCG tablet Take 100 mcg by mouth daily before breakfast.    . metoprolol tartrate (LOPRESSOR) 25 MG tablet Take 0.5 tablets (12.5 mg total) by mouth 2 (two) times daily. 180 tablet 1  . MULTAQ 400 MG tablet TAKE 1 TABLET BY MOUTH TWICE A DAY WITH A MEAL 180 tablet 1  . Multiple Vitamin (MULTIVITAMIN) capsule Take 1 capsule by mouth daily.    . pantoprazole (PROTONIX) 40 MG tablet TAKE 1 TABLET (40 MG TOTAL) BY MOUTH DAILY. 90 tablet 2  . rivaroxaban (XARELTO) 20 MG TABS tablet TAKE 1 TABLET BY MOUTH EVERY DAY 90 tablet 1  . UNABLE TO FIND Med Name: CPAP  [facility]    . zolpidem (AMBIEN) 5 MG tablet Take 2.5 mg by mouth at bedtime as needed for sleep.    . Macitentan (OPSUMIT) 10 MG TABS Take 1 tablet (10 mg total) by mouth daily. 30 tablet 11  . NITROSTAT 0.4 MG SL tablet PLACE 1 TABLET (0.4 MG TOTAL) UNDER THE TONGUE EVERY 5 (FIVE) MINUTES AS NEEDED FOR CHEST PAIN. (Patient not taking: Reported on 07/23/2016) 25 tablet 4  . potassium chloride SA (K-DUR,KLOR-CON) 20 MEQ tablet Take 1 tablet (20 mEq total) by mouth daily. 90 tablet 3   No current facility-administered medications for this encounter.    BP (!) 116/58   Pulse 78   Wt 196 lb 8 oz (89.1 kg)   SpO2 94% Comment: on 2L of O2  BMI 34.81 kg/m  General: NAD Neck: JVP 9-10 cm, no thyromegaly or thyroid nodule.  Lungs: Clear to auscultation bilaterally with normal respiratory effort. CV: Nondisplaced PMI.  Heart regular S1/S2, no S3/S4, no murmur.  Trace ankle edema.  No carotid bruit.  Normal pedal pulses.  Abdomen: Soft, nontender, no hepatosplenomegaly, no distention.  Skin: Intact without lesions or rashes.  Neurologic: Alert and oriented x 3.  Psych: Normal  affect. Extremities: No clubbing or cyanosis.  HEENT: Normal.   Assessment/Plan: 1. Chronic diastolic CHF: Echo with EF 65-70%.  There is also likely a component of RV failure from significant pulmonary hypertension.  NYHA class III symptoms.  She does appear to have some volume overload on exam.  - Increase Lasix to 40 mg daily and add KCl 20 mEq daily. BMET in 10 days.  2. Apical hypertrophic cardiomyopathy: Cardiac MRI in 7/16 showed mid-apical LV hypertrophy with vigorous contraction.  It may help to relax the LV a bit slow it to allow  more diastolic filling.  - I will have her increase metoprolol to 25 mg bid.  3. Atrial fibrillation: She is in NSR today on dronedarone.  She is on Xarelto.  To be evaluated by EP for possible redo atrial fibrillation ablation.  4. Pulmonary hypertension: RHC in 3/18 showed moderate pulmonary arterial hypertension with elevated PVR (7.3 WU by Fick, 9.6 WU by thermo). She has COPD but I do not think this plays a large role in the pulmonary hypertension.  PFTs in 3/18 showed minimal obstruction and restriction, there was significantly decreased DLCO, this may be due to pulmonary vascular disease.  OSA may play a role but not a large one.  I am concerned for group 1 PAH.  V/Q scan did not suggest chronic PEs in 3/18.  6 minute walk today with 109 m.  - Send serologies: RF, ANA, anti-SCL70, anti-Jo1, HIV  - Start Opsumit 10 mg daily.  Followup in 3 weeks, at that time will start tadalafil.   [name] 07/24/2016

## 2016-07-25 ENCOUNTER — Encounter (HOSPITAL_COMMUNITY): Payer: Self-pay

## 2016-07-25 DIAGNOSIS — G4730 Sleep apnea, unspecified: Secondary | ICD-10-CM | POA: Diagnosis not present

## 2016-07-25 DIAGNOSIS — I272 Pulmonary hypertension, unspecified: Secondary | ICD-10-CM | POA: Diagnosis not present

## 2016-07-25 DIAGNOSIS — I2723 Pulmonary hypertension due to lung diseases and hypoxia: Secondary | ICD-10-CM | POA: Diagnosis not present

## 2016-07-25 DIAGNOSIS — I2510 Atherosclerotic heart disease of native coronary artery without angina pectoris: Secondary | ICD-10-CM | POA: Diagnosis not present

## 2016-07-25 DIAGNOSIS — I422 Other hypertrophic cardiomyopathy: Secondary | ICD-10-CM | POA: Diagnosis not present

## 2016-07-25 DIAGNOSIS — I5032 Chronic diastolic (congestive) heart failure: Secondary | ICD-10-CM | POA: Diagnosis not present

## 2016-07-25 DIAGNOSIS — N183 Chronic kidney disease, stage 3 (moderate): Secondary | ICD-10-CM | POA: Diagnosis not present

## 2016-07-25 DIAGNOSIS — K921 Melena: Secondary | ICD-10-CM | POA: Diagnosis not present

## 2016-07-25 DIAGNOSIS — I480 Paroxysmal atrial fibrillation: Secondary | ICD-10-CM | POA: Diagnosis not present

## 2016-07-25 NOTE — Progress Notes (Signed)
Opsumit Enrollment form completed and faxed. Copy of forms scanned into patient's electronic medical record under media tab for reference.  Renee Pain, RN

## 2016-07-29 DIAGNOSIS — I5032 Chronic diastolic (congestive) heart failure: Secondary | ICD-10-CM | POA: Diagnosis not present

## 2016-07-29 DIAGNOSIS — I480 Paroxysmal atrial fibrillation: Secondary | ICD-10-CM | POA: Diagnosis not present

## 2016-07-29 DIAGNOSIS — N183 Chronic kidney disease, stage 3 (moderate): Secondary | ICD-10-CM | POA: Diagnosis not present

## 2016-07-29 DIAGNOSIS — I2510 Atherosclerotic heart disease of native coronary artery without angina pectoris: Secondary | ICD-10-CM | POA: Diagnosis not present

## 2016-07-29 DIAGNOSIS — I422 Other hypertrophic cardiomyopathy: Secondary | ICD-10-CM | POA: Diagnosis not present

## 2016-07-29 DIAGNOSIS — I272 Pulmonary hypertension, unspecified: Secondary | ICD-10-CM | POA: Diagnosis not present

## 2016-07-30 ENCOUNTER — Telehealth (HOSPITAL_COMMUNITY): Payer: Self-pay | Admitting: Pharmacist

## 2016-07-30 DIAGNOSIS — I4891 Unspecified atrial fibrillation: Secondary | ICD-10-CM | POA: Diagnosis not present

## 2016-07-30 DIAGNOSIS — Z7901 Long term (current) use of anticoagulants: Secondary | ICD-10-CM | POA: Diagnosis not present

## 2016-07-30 DIAGNOSIS — Z86010 Personal history of colonic polyps: Secondary | ICD-10-CM | POA: Diagnosis not present

## 2016-07-30 DIAGNOSIS — K625 Hemorrhage of anus and rectum: Secondary | ICD-10-CM | POA: Diagnosis not present

## 2016-07-30 DIAGNOSIS — I272 Pulmonary hypertension, unspecified: Secondary | ICD-10-CM | POA: Diagnosis not present

## 2016-07-30 NOTE — Telephone Encounter (Signed)
Opsumit PA approved by [employer] through 07/30/19.   Ruta Hinds. Velva Harman, PharmD, BCPS, CPP Clinical Pharmacist Pager: [phone] Phone: [phone] 07/30/2016 3:40 PM

## 2016-07-31 ENCOUNTER — Telehealth (HOSPITAL_COMMUNITY): Payer: Self-pay | Admitting: *Deleted

## 2016-07-31 ENCOUNTER — Other Ambulatory Visit: Payer: Self-pay | Admitting: Gastroenterology

## 2016-07-31 DIAGNOSIS — N183 Chronic kidney disease, stage 3 (moderate): Secondary | ICD-10-CM | POA: Diagnosis not present

## 2016-07-31 DIAGNOSIS — I5032 Chronic diastolic (congestive) heart failure: Secondary | ICD-10-CM

## 2016-07-31 DIAGNOSIS — I422 Other hypertrophic cardiomyopathy: Secondary | ICD-10-CM | POA: Diagnosis not present

## 2016-07-31 DIAGNOSIS — I480 Paroxysmal atrial fibrillation: Secondary | ICD-10-CM | POA: Diagnosis not present

## 2016-07-31 DIAGNOSIS — I272 Pulmonary hypertension, unspecified: Secondary | ICD-10-CM | POA: Diagnosis not present

## 2016-07-31 DIAGNOSIS — Z1211 Encounter for screening for malignant neoplasm of colon: Secondary | ICD-10-CM

## 2016-07-31 DIAGNOSIS — I2510 Atherosclerotic heart disease of native coronary artery without angina pectoris: Secondary | ICD-10-CM | POA: Diagnosis not present

## 2016-07-31 NOTE — Telephone Encounter (Signed)
Anti-Scleroderma Antibody  Order: [id number]  Status:  Final result Visible to patient:  Yes (MyChart) Dx:  Pulmonary hypertension, primary ([place])  Notes recorded by Kennieth Rad, RN on 07/31/2016 at 4:41 PM EDT Called patient regarding negative tests and she is aware. I have added lab order for the CCP antibody and she is coming for a BMET next week and we will draw the CCP at that time. ------  Notes recorded by Larey Dresser, MD on 07/24/2016 at 11:20 PM EDT Very mildly elevated, check CCP antibody. ------  Notes recorded by Larey Dresser, MD on 07/24/2016 at 1:31 PM EDT Negative tests, please let her know.

## 2016-08-06 DIAGNOSIS — I422 Other hypertrophic cardiomyopathy: Secondary | ICD-10-CM | POA: Diagnosis not present

## 2016-08-06 DIAGNOSIS — I480 Paroxysmal atrial fibrillation: Secondary | ICD-10-CM | POA: Diagnosis not present

## 2016-08-06 DIAGNOSIS — I2510 Atherosclerotic heart disease of native coronary artery without angina pectoris: Secondary | ICD-10-CM | POA: Diagnosis not present

## 2016-08-06 DIAGNOSIS — I272 Pulmonary hypertension, unspecified: Secondary | ICD-10-CM | POA: Diagnosis not present

## 2016-08-06 DIAGNOSIS — I5032 Chronic diastolic (congestive) heart failure: Secondary | ICD-10-CM | POA: Diagnosis not present

## 2016-08-06 DIAGNOSIS — N183 Chronic kidney disease, stage 3 (moderate): Secondary | ICD-10-CM | POA: Diagnosis not present

## 2016-08-07 ENCOUNTER — Ambulatory Visit (HOSPITAL_COMMUNITY)
Admission: RE | Admit: 2016-08-07 | Discharge: 2016-08-07 | Disposition: A | Discharge status: Home / Self Care | Payer: Medicare Other | Source: Ambulatory Visit | Attending: Cardiology | Admitting: Cardiology

## 2016-08-07 ENCOUNTER — Encounter: Payer: Self-pay | Admitting: *Deleted

## 2016-08-07 DIAGNOSIS — I270 Primary pulmonary hypertension: Secondary | ICD-10-CM | POA: Diagnosis not present

## 2016-08-07 DIAGNOSIS — Z006 Encounter for examination for normal comparison and control in clinical research program: Secondary | ICD-10-CM

## 2016-08-07 DIAGNOSIS — I5032 Chronic diastolic (congestive) heart failure: Secondary | ICD-10-CM | POA: Diagnosis not present

## 2016-08-07 LAB — BASIC METABOLIC PANEL
ANION GAP: 10 (ref 5–15)
BUN: 33 mg/dL — ABNORMAL HIGH (ref 6–20)
CALCIUM: 9.3 mg/dL (ref 8.9–10.3)
CO2: 22 mmol/L (ref 22–32)
Chloride: 105 mmol/L (ref 101–111)
Creatinine, Ser: 1.96 mg/dL — ABNORMAL HIGH (ref 0.44–1.00)
GFR, EST AFRICAN AMERICAN: 28 mL/min — AB (ref >60)
GFR, EST NON AFRICAN AMERICAN: 25 mL/min — AB (ref >60)
Glucose, Bld: 98 mg/dL (ref 65–99)
POTASSIUM: 4.4 mmol/L (ref 3.5–5.1)
Sodium: 137 mmol/L (ref 135–145)

## 2016-08-07 NOTE — Progress Notes (Signed)
OPUS REGISTRY Informed Consent   Subject Name: Amy [name]    Subject met inclusion and exclusion criteria. The informed consent form, study requirements and expectations were reviewed with the subject and questions and concerns were addressed prior to the signing of the consent form. The subject verbalized understanding of the trial requirements. The subject agreed to participate in the [place] and signed the informed consent. The informed consent was obtained prior to performance of any protocol-specific procedures for the subject. A copy of the signed informed consent was given to the subject.

## 2016-08-11 ENCOUNTER — Ambulatory Visit
Admission: RE | Admit: 2016-08-11 | Discharge: 2016-08-11 | Disposition: A | Discharge status: Home / Self Care | Payer: Medicare Other | Source: Ambulatory Visit | Attending: Gastroenterology | Admitting: Gastroenterology

## 2016-08-11 DIAGNOSIS — I714 Abdominal aortic aneurysm, without rupture: Secondary | ICD-10-CM | POA: Diagnosis not present

## 2016-08-11 DIAGNOSIS — Z1211 Encounter for screening for malignant neoplasm of colon: Secondary | ICD-10-CM

## 2016-08-12 DIAGNOSIS — I272 Pulmonary hypertension, unspecified: Secondary | ICD-10-CM | POA: Diagnosis not present

## 2016-08-12 DIAGNOSIS — I2510 Atherosclerotic heart disease of native coronary artery without angina pectoris: Secondary | ICD-10-CM | POA: Diagnosis not present

## 2016-08-12 DIAGNOSIS — I5032 Chronic diastolic (congestive) heart failure: Secondary | ICD-10-CM | POA: Diagnosis not present

## 2016-08-12 DIAGNOSIS — I480 Paroxysmal atrial fibrillation: Secondary | ICD-10-CM | POA: Diagnosis not present

## 2016-08-12 DIAGNOSIS — N183 Chronic kidney disease, stage 3 (moderate): Secondary | ICD-10-CM | POA: Diagnosis not present

## 2016-08-12 DIAGNOSIS — I422 Other hypertrophic cardiomyopathy: Secondary | ICD-10-CM | POA: Diagnosis not present

## 2016-08-13 ENCOUNTER — Telehealth (HOSPITAL_COMMUNITY): Payer: Self-pay | Admitting: *Deleted

## 2016-08-13 DIAGNOSIS — I272 Pulmonary hypertension, unspecified: Secondary | ICD-10-CM | POA: Diagnosis not present

## 2016-08-13 DIAGNOSIS — I5032 Chronic diastolic (congestive) heart failure: Secondary | ICD-10-CM | POA: Diagnosis not present

## 2016-08-13 DIAGNOSIS — I2510 Atherosclerotic heart disease of native coronary artery without angina pectoris: Secondary | ICD-10-CM | POA: Diagnosis not present

## 2016-08-13 DIAGNOSIS — N183 Chronic kidney disease, stage 3 (moderate): Secondary | ICD-10-CM | POA: Diagnosis not present

## 2016-08-13 DIAGNOSIS — I422 Other hypertrophic cardiomyopathy: Secondary | ICD-10-CM | POA: Diagnosis not present

## 2016-08-13 DIAGNOSIS — I480 Paroxysmal atrial fibrillation: Secondary | ICD-10-CM | POA: Diagnosis not present

## 2016-08-13 MED ORDER — POTASSIUM CHLORIDE CRYS ER 20 MEQ PO TBCR: 10.0000 meq | EXTENDED_RELEASE_TABLET | Freq: Every day | ORAL | 3 refills | Status: DC

## 2016-08-13 MED ORDER — FUROSEMIDE 20 MG PO TABS: 20.0000 mg | ORAL_TABLET | Freq: Every day | ORAL | 6 refills | Status: DC

## 2016-08-13 NOTE — Telephone Encounter (Signed)
Notes recorded by Harvie Junior, CMA on 08/13/2016 at 3:41 PM EDT Pt aware of lab results ------  Notes recorded by Scarlette Calico, RN on 08/07/2016 at 4:58 PM EDT Left message to call back ------  Notes recorded by Larey Dresser, MD on 08/07/2016 at 1:19 PM EDT Creatinine is higher, decrease Lasix to 20 mg daily and decrease KCl to 10 daily.    Ref Range & Units 6d ago (08/07/16) 69moago (06/26/16) 121mogo (06/24/16)   Sodium 135 - 145 mmol/L 137  136  141    Potassium 3.5 - 5.1 mmol/L 4.4  3.8  4.1    Chloride 101 - 111 mmol/L 105  103  107    CO2 22 - 32 mmol/L _0 Glucose, Bld 65 - 99 mg/dL 98  94  67    BUN 6 - 20 mg/dL 33   19  15    Creatinine, Ser 0.44 - 1.00 mg/dL 1.96   1.26   1.30     Calcium 8.9 - 10.3 mg/dL 9.3  8.7   9.1    GFR calc non Af Amer >60 mL/min 25   42   40     GFR calc Af Amer >60 mL/min 28   48CM   47CM

## 2016-08-13 NOTE — Telephone Encounter (Signed)
-----  Message from Larey Dresser, MD sent at 08/07/2016  1:19 PM EDT ----- Creatinine is higher, decrease Lasix to 20 mg daily and decrease KCl to 10 daily.

## 2016-08-14 DIAGNOSIS — N183 Chronic kidney disease, stage 3 (moderate): Secondary | ICD-10-CM | POA: Diagnosis not present

## 2016-08-14 DIAGNOSIS — I480 Paroxysmal atrial fibrillation: Secondary | ICD-10-CM | POA: Diagnosis not present

## 2016-08-14 DIAGNOSIS — I272 Pulmonary hypertension, unspecified: Secondary | ICD-10-CM | POA: Diagnosis not present

## 2016-08-14 DIAGNOSIS — I422 Other hypertrophic cardiomyopathy: Secondary | ICD-10-CM | POA: Diagnosis not present

## 2016-08-14 DIAGNOSIS — I5032 Chronic diastolic (congestive) heart failure: Secondary | ICD-10-CM | POA: Diagnosis not present

## 2016-08-14 DIAGNOSIS — I2510 Atherosclerotic heart disease of native coronary artery without angina pectoris: Secondary | ICD-10-CM | POA: Diagnosis not present

## 2016-08-15 ENCOUNTER — Ambulatory Visit (HOSPITAL_COMMUNITY)
Admission: RE | Admit: 2016-08-15 | Discharge: 2016-08-15 | Disposition: A | Discharge status: Home / Self Care | Payer: Medicare Other | Source: Ambulatory Visit | Attending: Cardiology | Admitting: Cardiology

## 2016-08-15 ENCOUNTER — Encounter (HOSPITAL_COMMUNITY): Payer: Self-pay

## 2016-08-15 VITALS — BP 115/53 | HR 84 | Ht 63.0 in | Wt 194.5 lb

## 2016-08-15 DIAGNOSIS — Z7951 Long term (current) use of inhaled steroids: Secondary | ICD-10-CM | POA: Insufficient documentation

## 2016-08-15 DIAGNOSIS — Z87891 Personal history of nicotine dependence: Secondary | ICD-10-CM | POA: Insufficient documentation

## 2016-08-15 DIAGNOSIS — I480 Paroxysmal atrial fibrillation: Secondary | ICD-10-CM | POA: Diagnosis not present

## 2016-08-15 DIAGNOSIS — K219 Gastro-esophageal reflux disease without esophagitis: Secondary | ICD-10-CM | POA: Diagnosis not present

## 2016-08-15 DIAGNOSIS — R001 Bradycardia, unspecified: Secondary | ICD-10-CM | POA: Diagnosis not present

## 2016-08-15 DIAGNOSIS — I2721 Secondary pulmonary arterial hypertension: Secondary | ICD-10-CM | POA: Insufficient documentation

## 2016-08-15 DIAGNOSIS — I5032 Chronic diastolic (congestive) heart failure: Secondary | ICD-10-CM | POA: Diagnosis not present

## 2016-08-15 DIAGNOSIS — I517 Cardiomegaly: Secondary | ICD-10-CM | POA: Insufficient documentation

## 2016-08-15 DIAGNOSIS — E039 Hypothyroidism, unspecified: Secondary | ICD-10-CM | POA: Insufficient documentation

## 2016-08-15 DIAGNOSIS — I25118 Atherosclerotic heart disease of native coronary artery with other forms of angina pectoris: Secondary | ICD-10-CM | POA: Diagnosis not present

## 2016-08-15 DIAGNOSIS — I422 Other hypertrophic cardiomyopathy: Secondary | ICD-10-CM | POA: Diagnosis not present

## 2016-08-15 DIAGNOSIS — Z7901 Long term (current) use of anticoagulants: Secondary | ICD-10-CM | POA: Diagnosis not present

## 2016-08-15 DIAGNOSIS — J449 Chronic obstructive pulmonary disease, unspecified: Secondary | ICD-10-CM | POA: Diagnosis not present

## 2016-08-15 DIAGNOSIS — E785 Hyperlipidemia, unspecified: Secondary | ICD-10-CM | POA: Diagnosis not present

## 2016-08-15 DIAGNOSIS — I2510 Atherosclerotic heart disease of native coronary artery without angina pectoris: Secondary | ICD-10-CM | POA: Diagnosis not present

## 2016-08-15 DIAGNOSIS — G4733 Obstructive sleep apnea (adult) (pediatric): Secondary | ICD-10-CM | POA: Insufficient documentation

## 2016-08-15 DIAGNOSIS — I272 Pulmonary hypertension, unspecified: Secondary | ICD-10-CM | POA: Diagnosis not present

## 2016-08-15 LAB — BASIC METABOLIC PANEL
ANION GAP: 7 (ref 5–15)
BUN: 29 mg/dL — ABNORMAL HIGH (ref 6–20)
CALCIUM: 9.4 mg/dL (ref 8.9–10.3)
CO2: 20 mmol/L — AB (ref 22–32)
Chloride: 112 mmol/L — ABNORMAL HIGH (ref 101–111)
Creatinine, Ser: 1.56 mg/dL — ABNORMAL HIGH (ref 0.44–1.00)
GFR, EST AFRICAN AMERICAN: 37 mL/min — AB (ref >60)
GFR, EST NON AFRICAN AMERICAN: 32 mL/min — AB (ref >60)
Glucose, Bld: 69 mg/dL (ref 65–99)
Potassium: 4.1 mmol/L (ref 3.5–5.1)
SODIUM: 139 mmol/L (ref 135–145)

## 2016-08-15 NOTE — Progress Notes (Signed)
PCP: Dr. Radene Ou Cardiology: Dr. Radford Pax HF Cardiology: Dr. Aundra Dubin  [name] was referred by Dr. Radford Pax for evaluation of pulmonary hypertension and CHF.   71 yo with history of COPD, OSA on CPAP, apical hypertrophic cardiomyopathy, chronic diastolic CHF, and paroxysmal atrial fibrillation presents for evaluation of CHF and pulmonary hypertension.  Patient was admitted in 3/18 with CHF and dyspnea, found to have pulmonary arterial hypertension by RHC.  Prior to this, she has had a long history of PAF.  She is currently taking dronedarone but has had breakthrough fibrillation.  She is in NSR today.  She had an ablation in the past and is being evaluated for consideration of redo ablation. She has apical hypertrophic cardiomyopathy proven by cardiac MRI.   She presents today for follow up. At last visit started on Opsumit. She feels like it has helped her. She is able to walk for 3-4 minutes on flat ground. No chest pain. Denies orthopnea/PND. Remains on home 02.  Had episodes of palpitations after virtual colonoscopy on Monday.  Feeling fine since with no recurrence. Has not been back to Dr Rayann Heman to talk about repeat ablation.  Lasix dose was decreased back to 20 mg daily recently with rise in creatinine. Weight today is down 2 lbs.  ECG: Personally reviewed, NSR, LVH with repolarization abnormality.    Labs (2/18): TSH normal Labs (3/18): K 3.8, creatinine 1.26, BNP 424 Labs (5/18): K 4.4, creatinine 1.96, anti-SCL 70 negative, RF very slightly elevated, HIV negative.   4//18 6 minute walk: 109 m   PMH:  1. COPD: Quit smoking 2011.  - PFTs (3/18): minimal obstruction, minimal restriction, DLCO 22% (severely decreased).  2. Hypothyroidism 3. Hyperlipidemia 4. OSA: Uses CPAP 5. Apical hypertrophic cardiomyopathy:  - Cardiac MRI (7/16) with EF 79%, mid-apical LV hypertrophy with spade-like ventricle, mid-myocardial LGE was noted at the apex.   6. GERD 7. Atrial fibrillation: Paroxysmal.     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  - Long QT interval so dofetilide and sotalol have not been used.  - She has had atrial fibrillation ablation.  - Currently on dronedarone.  8. Coronary artery disease: LHC (7/16) with nonobstructive disease + 85% ostial stenosis small D1.  9. Diastolic CHF: Echo (5/10) with EF 65-70%, apical hypertrophic cardiomyopathy, severe LAE, PASP 86 mmHg.  - RHC (3/18): mean RA 4, PA 69/23 mean 38, mean PCWP 8, CI 2.17, PVR 7.3 WU Fick, 9.6 WU thermo.  10. Pulmonary hypertension: See RHC above.  - V/Q scan negative 3/18 - HIV, SCL70 negative.  RF borderline elevated, doubt significant.  11. CPX (12/17): peak VO2 8.5, RER 0.96, VE/VCO2 slope 67. Submaximal but appears to show severe functional impairment.    Social History   Social History  . Marital status: Widowed    Spouse name: divorced.  . Number of children: 1  . Years of education: N/A   Occupational History  . retired. still works for [employer].     Social History Main Topics  . Smoking status: Former Smoker    Packs/day: 0.50    Years: 44.00    Types: Cigarettes    Quit date: 04/07/2009  . Smokeless tobacco: Never Used  . Alcohol use Yes     Comment: 2 to 3 glasses wine per week  . Drug use: No  . Sexual activity: No   Other Topics Concern  . Not on file   Social History Narrative   Pt lives alone with 2 dogs.    Family  History  Problem Relation Age of Onset  . Heart disease Sister        Good Pastures Syndrome  . Gout Father   . Lung cancer Father        smoked  . Breast cancer Paternal Aunt    Review of systems complete and found to be negative unless listed in HPI.    Current Outpatient Prescriptions  Medication Sig Dispense Refill  . albuterol (PROVENTIL HFA;VENTOLIN HFA) 108 (90 BASE) MCG/ACT inhaler Inhale 2 puffs into the lungs every 6 (six) hours as needed for wheezing or shortness of breath.     Marland Kitchen atorvastatin (LIPITOR) 80 MG tablet Take 1 tablet (80 mg total) by mouth  daily with lunch. 90 tablet 3  . Cholecalciferol (VITAMIN D) 2000 UNITS CAPS Take 2,000 Units by mouth daily.     . colchicine 0.6 MG tablet Take 0.6 mg by mouth daily.     . cyclobenzaprine (FLEXERIL) 5 MG tablet Take 1 tablet (5 mg total) by mouth 3 (three) times daily as needed for muscle spasms. 10 tablet 0  . ezetimibe (ZETIA) 10 MG tablet TAKE 1 TABLET (10 MG TOTAL) BY MOUTH DAILY. 90 tablet 3  . furosemide (LASIX) 20 MG tablet Take 1 tablet (20 mg total) by mouth daily. 30 tablet 6  . Glycopyrrolate-Formoterol (BEVESPI AEROSPHERE) 9-4.8 MCG/ACT AERO Inhale 2 puffs into the lungs 2 (two) times daily. (Patient taking differently: Inhale 1 puff into the lungs 2 (two) times daily. ) 1 Inhaler 5  . levothyroxine (SYNTHROID, LEVOTHROID) 100 MCG tablet Take 100 mcg by mouth daily before breakfast.    . Macitentan (OPSUMIT) 10 MG TABS Take 1 tablet (10 mg total) by mouth daily. 30 tablet 11  . metoprolol tartrate (LOPRESSOR) 25 MG tablet Take 0.5 tablets (12.5 mg total) by mouth 2 (two) times daily. 180 tablet 1  . MULTAQ 400 MG tablet TAKE 1 TABLET BY MOUTH TWICE A DAY WITH A MEAL 180 tablet 1  . Multiple Vitamin (MULTIVITAMIN) capsule Take 1 capsule by mouth daily.    Marland Kitchen NITROSTAT 0.4 MG SL tablet PLACE 1 TABLET (0.4 MG TOTAL) UNDER THE TONGUE EVERY 5 (FIVE) MINUTES AS NEEDED FOR CHEST PAIN. 25 tablet 4  . pantoprazole (PROTONIX) 40 MG tablet TAKE 1 TABLET (40 MG TOTAL) BY MOUTH DAILY. 90 tablet 2  . potassium chloride SA (K-DUR,KLOR-CON) 20 MEQ tablet Take 0.5 tablets (10 mEq total) by mouth daily. 45 tablet 3  . rivaroxaban (XARELTO) 20 MG TABS tablet TAKE 1 TABLET BY MOUTH EVERY DAY 90 tablet 1  . UNABLE TO FIND Med Name: CPAP  D[facility]    . zolpidem (AMBIEN) 5 MG tablet Take 2.5 mg by mouth at bedtime as needed for sleep.     No current facility-administered medications for this encounter.    BP (!) 115/53   Pulse 84   Ht _0  (1.6 m)   Wt 194 lb 8 oz (88.2 kg)   SpO2 95% Comment: 2L o2   BMI 34.45 kg/m    General: Elderly, well appearing. In [place].  HEENT: normal. 02 via [place] Neck: supple. JVD 7-8. Carotids 2+ bilat; no bruits. No thyromegaly or nodule noted. Cor: PMI nondisplaced. RRR, No M/G/R noted Lungs: Slightly diminished basilar sounds, otherwise clear. Abdomen: soft, non-tender, distended, no HSM. No bruits or masses. +BS  Extremities: no cyanosis, clubbing, rash, R and LLE no edema.  Neuro: alert & orientedx3, cranial nerves grossly intact. moves all 4 extremities w/o difficulty. Affect pleasant  Assessment/Plan: 1. Chronic diastolic CHF: Echo with EF 65-70%.  There is also likely a component of RV failure from significant pulmonary hypertension.  NYHA III symptoms.  Volume status stable on exam, weight down.   - Continue lasix 20 mg daily and potassium 10 meq daily (Lasix decreased recently with rise in creatinine). BMET today. 2. Apical hypertrophic cardiomyopathy: Cardiac MRI in 7/16 showed mid-apical LV hypertrophy with vigorous contraction.  It may help to relax the LV a bit slow it to allow more diastolic filling.  - Continue metoprolol 12.5 mg BID. Will not increase with soft pressure and bradycardia ( average resting HR 53 bpm by her fitbit).  3. Atrial fibrillation:  Remains in NSR on Multaq.  Likely had episode of PAF on Monday.  - Continue Xarelto.  No bleeding.  - Needs to see Dr. Rayann Heman about possible redo atrial fibrillation ablation.  4. Pulmonary hypertension: RHC in 3/18 showed moderate pulmonary arterial hypertension with elevated PVR (7.3 WU by Fick, 9.6 WU by thermo). She has COPD but I do not think this plays a large role in the pulmonary hypertension.  PFTs in 3/18 showed minimal obstruction and restriction, there was significantly decreased DLCO, this may be due to pulmonary vascular disease.  OSA may play a role but not a large one. Concerned for group 1 PAH.  V/Q scan did not suggest chronic PEs in 3/18.  RF borderline elevated (probably not  significant), HIV negative, Anti-SCL70 negative.  - Continue Opsumit 10 mg daily.   - Add Adcirca 20 mg daily and follow up 4 weeks for titration. - Check ANA.  - Given construction in the hallway, will defer 6 minute walk to next appt.    Shirley Friar, PA-C  08/15/2016   Patient seen with PA, agree with the above note. I made edits to the above note.  Symptomatically a bit better with Opsumit but still limited.   - Add Adcirca 20 mg daily and titrate up.  - BMET, ANA to be drawn today.  - Continue current Lasix.   - defer 6 minute walk due to construction.  - Followup with Dr. Rayann Heman re: redo ablation (having breakthrough atrial fibrillation episodes).   Followup 1 month.   [name] 08/17/2016

## 2016-08-15 NOTE — Patient Instructions (Signed)
Start Adcirca 20 mg daily, this will come from a specialty pharmacy  Labs today  Your physician recommends that you schedule a follow-up appointment in: 1 month

## 2016-08-16 LAB — ANA COMPREHENSIVE PANEL
Anti JO-1: <0.2 AI (ref 0.0–0.9)
Chromatin Ab SerPl-aCnc: <0.2 AI (ref 0.0–0.9)
Ribonucleic Protein: <0.2 AI (ref 0.0–0.9)
SSA (Ro) (ENA) Antibody, IgG: <0.2 AI (ref 0.0–0.9)
SSB (La) (ENA) Antibody, IgG: <0.2 AI (ref 0.0–0.9)
Scleroderma (Scl-70) (ENA) Antibody, IgG: <0.2 AI (ref 0.0–0.9)
ds DNA Ab: 1 IU/mL (ref 0–9)

## 2016-08-18 ENCOUNTER — Other Ambulatory Visit (HOSPITAL_COMMUNITY): Payer: Self-pay | Admitting: Pharmacist

## 2016-08-18 ENCOUNTER — Telehealth (HOSPITAL_COMMUNITY): Payer: Self-pay | Admitting: Pharmacist

## 2016-08-18 MED ORDER — TADALAFIL (PAH) 20 MG PO TABS: 20.0000 mg | ORAL_TABLET | Freq: Every day | ORAL | 11 refills | Status: DC

## 2016-08-18 NOTE — Telephone Encounter (Signed)
Adcirca 20 mg daily PA approved by [employer] through 08/18/19.   Ruta Hinds. Velva Harman, PharmD, BCPS, CPP Clinical Pharmacist Pager: [phone] Phone: [phone] 08/18/2016 12:14 PM

## 2016-08-19 ENCOUNTER — Ambulatory Visit (INDEPENDENT_AMBULATORY_CARE_PROVIDER_SITE_OTHER): Payer: Medicare Other | Admitting: Cardiology

## 2016-08-19 ENCOUNTER — Encounter: Payer: Self-pay | Admitting: Cardiology

## 2016-08-19 VITALS — BP 100/58 | HR 53 | Ht 63.0 in | Wt 195.4 lb

## 2016-08-19 DIAGNOSIS — I422 Other hypertrophic cardiomyopathy: Secondary | ICD-10-CM | POA: Diagnosis not present

## 2016-08-19 DIAGNOSIS — I5032 Chronic diastolic (congestive) heart failure: Secondary | ICD-10-CM | POA: Diagnosis not present

## 2016-08-19 DIAGNOSIS — I481 Persistent atrial fibrillation: Secondary | ICD-10-CM

## 2016-08-19 DIAGNOSIS — I270 Primary pulmonary hypertension: Secondary | ICD-10-CM | POA: Diagnosis not present

## 2016-08-19 DIAGNOSIS — I2510 Atherosclerotic heart disease of native coronary artery without angina pectoris: Secondary | ICD-10-CM | POA: Diagnosis not present

## 2016-08-19 DIAGNOSIS — I4819 Other persistent atrial fibrillation: Secondary | ICD-10-CM

## 2016-08-19 NOTE — Progress Notes (Signed)
Cardiology Office Note    Date:  08/19/2016   ID:  KHALEESI GRUEL, DOB 1945-07-22, MRN [id number]  PCP:  Aretta Nip, MD  Cardiologist:  Fransico Him, MD   Chief Complaint  Patient presents with  . Coronary Artery Disease  . Hyperlipidemia  . Hypertension  . Atrial Fibrillation  . Sleep Apnea  . Congestive Heart Failure    History of Present Illness:  Amy [name] is a 71 y.o. 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  She also has apical HCOM by cardiac RI and mild to moderate pulmonary HTN with PAP 45/48mHg at cath. She was evaluated by Dr. CGwenette Greetand it was felt that her SOB may be due to [facility] with elevated ESR and her Amio was stopped. She is now on Multaq but has been having breakthrough afib and has been evaluated by EP for possible redo afib ablation.  She is followed in AHF clinic by Dr. MAundra Dubinand is now on Opsumit for her pulmonary HTN.  She saw him last week and was started on Adcirca but insurance has not improved it yet.    She  presents today for followup today and has been doing better.  She has not had any chest pain, syncope, orthopnea or PND. She still has some chronic LE edema in her ankles which is controlled on diuretics. Most of her LE edema is from gout. Her SOB has improved after starting Opsumit. She continues on 2L [place] O2.   She is doing well with her BIPAP. She now is using a nasal mask with O2.  She sometimes feels rested in the am but not always and usually takes one nap in the afternoon.    She has no significant mouth dryness.   he continues to have breakthrough of atrial fibrillation and yesterday had an episode yesterday with HR as high as 124bpm lasting 930m. She has not had any since yesterday.  She has not gotten an followup appt with Dr. AlRayann Heman   Past Medical History:  Diagnosis Date  .  Apical variant hypertrophic cardiomyopathy ([place]   Diagnosed by echo and ECG 11/13  . Atypical atrial flutter ([facility] [place]  . CAD (coronary artery disease)    a. LHC in 10/2011 demonstrated non-obstructive disease and an 80% lesion in a small D1 treated medically.   . Chronic diastolic heart failure ([facility]  . CKD (chronic kidney disease), stage III   . COPD (chronic obstructive pulmonary disease) (HCM[place]  . Dizziness    had PT for being off balance - in approx. 2012  . GERD (gastroesophageal reflux disease)   . Hyperlipidemia   . Hypertension   . Hypothyroid   . Lumbar disc disease   . Macular degeneration   . OSA (obstructive sleep apnea)   . Persistent atrial fibrillation (HCS[place]      . Pulmonary hypertension (HCC[place]   a. [place]/2015 showed mild PAH with normal PCWP and RA pressure, could be related to OSA and low oxygen saturation.   . Refusal of blood transfusions as patient is Jehovah's Witness   . Sinus bradycardia     Past Surgical History:  Procedure Laterality Date  . CARDIAC CATHETERIZATION     normal coronary arteries  . CARDIAC CATHETERIZATION N/A 10/31/2014   Procedure: Left Heart Cath and Coronary  Angiography;  Surgeon: Leonie Man, MD;  Location: [place] CV LAB;  Service: Cardiovascular;  Laterality: N/A;  . CATARACT EXTRACTION Bilateral   . CESAREAN SECTION  1983  . ELECTROPHYSIOLOGIC STUDY N/A 04/24/2015   Procedure: Atrial Fibrillation Ablation;  Surgeon: [name], MD;  Location: [place] CV LAB;  Service: Cardiovascular;  Laterality: N/A;  . RIGHT HEART CATH N/A 06/25/2016   Procedure: Right Heart Cath;  Surgeon: Sherren Mocha, MD;  Location: [place] CV LAB;  Service: Cardiovascular;  Laterality: N/A;  . RIGHT HEART CATHETERIZATION N/A 06/01/2013   Procedure: RIGHT HEART CATH;  Surgeon: Larey Dresser, MD;  Location: [facility] CATH LAB;  Service: Cardiovascular;  Laterality: N/A;  . TEE WITHOUT CARDIOVERSION N/A 04/23/2015   Procedure: TRANSESOPHAGEAL  ECHOCARDIOGRAM (TEE);  Surgeon: Josue Hector, MD;  Location: [facility] ENDOSCOPY;  Service: Cardiovascular;  Laterality: N/A;    Current Medications: Current Meds  Medication Sig  . albuterol (PROVENTIL HFA;VENTOLIN HFA) 108 (90 BASE) MCG/ACT inhaler Inhale 2 puffs into the lungs every 6 (six) hours as needed for wheezing or shortness of breath.   Marland Kitchen atorvastatin (LIPITOR) 80 MG tablet Take 1 tablet (80 mg total) by mouth daily with lunch.  . Cholecalciferol (VITAMIN D) 2000 UNITS CAPS Take 2,000 Units by mouth daily.   . colchicine 0.6 MG tablet Take 0.6 mg by mouth daily.   . cyclobenzaprine (FLEXERIL) 5 MG tablet Take 1 tablet (5 mg total) by mouth 3 (three) times daily as needed for muscle spasms.  Marland Kitchen ezetimibe (ZETIA) 10 MG tablet TAKE 1 TABLET (10 MG TOTAL) BY MOUTH DAILY.  . furosemide (LASIX) 20 MG tablet Take 1 tablet (20 mg total) by mouth daily.  . Glycopyrrolate-Formoterol (BEVESPI AEROSPHERE) 9-4.8 MCG/ACT AERO Inhale 2 puffs into the lungs 2 (two) times daily. (Patient taking differently: Inhale 1 puff into the lungs 2 (two) times daily. )  . levothyroxine (SYNTHROID, LEVOTHROID) 100 MCG tablet Take 100 mcg by mouth daily before breakfast.  . Macitentan (OPSUMIT) 10 MG TABS Take 1 tablet (10 mg total) by mouth daily.  . metoprolol tartrate (LOPRESSOR) 25 MG tablet Take 0.5 tablets (12.5 mg total) by mouth 2 (two) times daily.  . MULTAQ 400 MG tablet TAKE 1 TABLET BY MOUTH TWICE A DAY WITH A MEAL  . Multiple Vitamin (MULTIVITAMIN) capsule Take 1 capsule by mouth daily.  Marland Kitchen NITROSTAT 0.4 MG SL tablet PLACE 1 TABLET (0.4 MG TOTAL) UNDER THE TONGUE EVERY 5 (FIVE) MINUTES AS NEEDED FOR CHEST PAIN.  Marland Kitchen pantoprazole (PROTONIX) 40 MG tablet TAKE 1 TABLET (40 MG TOTAL) BY MOUTH DAILY.  Marland Kitchen potassium chloride SA (K-DUR,KLOR-CON) 20 MEQ tablet Take 0.5 tablets (10 mEq total) by mouth daily.  . rivaroxaban (XARELTO) 20 MG TABS tablet TAKE 1 TABLET BY MOUTH EVERY DAY  . tadalafil, PAH, (ADCIRCA) 20 MG  tablet Take 1 tablet (20 mg total) by mouth daily.  Marland Kitchen UNABLE TO FIND Med Name: CPAP  DME-[facility]  . zolpidem (AMBIEN) 5 MG tablet Take 2.5 mg by mouth at bedtime as needed for sleep.    Allergies:   Amiodarone and Zyban [bupropion]   Social History   Social History  . Marital status: Widowed    Spouse name: divorced.  . Number of children: 1  . Years of education: N/A   Occupational History  . retired. still works for [employer].     Social History Main Topics  . Smoking status: Former Smoker    Packs/day: 0.50    Years:  44.00    Types: Cigarettes    Quit date: 04/07/2009  . Smokeless tobacco: Never Used  . Alcohol use Yes     Comment: 2 to 3 glasses wine per week  . Drug use: No  . Sexual activity: No   Other Topics Concern  . None   Social History Narrative   Pt lives alone with 2 dogs.      Family History:  The patient's family history includes Breast cancer in her paternal aunt; Gout in her father; Heart disease in her sister; Lung cancer in her father.   ROS:   Please see the history of present illness.    ROS All other systems reviewed and are negative.  No flowsheet data found.     PHYSICAL EXAM:   VS:  BP (!) 100/58   Pulse (!) 53   Ht _0  (1.6 m)   Wt 195 lb 6.4 oz (88.6 kg)   SpO2 92%   BMI 34.61 kg/m    GEN: Well nourished, well developed, in no acute distress  HEENT: normal  Neck: no JVD, carotid bruits, or masses Cardiac: RRR; no murmurs, rubs, or gallops,no edema.  Intact distal pulses bilaterally.  Respiratory:  clear to auscultation bilaterally, normal work of breathing GI: soft, nontender, nondistended, + BS MS: no deformity or atrophy  Skin: warm and dry, no rash Neuro:  Alert and Oriented x 3, Strength and sensation are intact Psych: euthymic mood, full affect  Wt Readings from Last 3 Encounters:  08/19/16 195 lb 6.4 oz (88.6 kg)  08/15/16 194 lb 8 oz (88.2 kg)  07/23/16 196 lb 8 oz (89.1 kg)      Studies/Labs Reviewed:   EKG:   EKG is not ordered today.    Recent Labs: 05/26/2016: TSH 0.34 06/23/2016: B Natriuretic Peptide 424.4 06/24/2016: ALT 38 06/26/2016: Hemoglobin 13.9; Platelets 265 08/15/2016: BUN 29; Creatinine, Ser 1.56; Potassium 4.1; Sodium 139   Lipid Panel    Component Value Date/Time   CHOL 137 09/27/2014 0946   TRIG 76.0 09/27/2014 0946   HDL 49.50 09/27/2014 0946   CHOLHDL 3 09/27/2014 0946   VLDL 15.2 09/27/2014 0946   LDLCALC 72 09/27/2014 0946   LDLDIRECT 115.0 06/09/2014 1449    Additional studies/ records that were reviewed today include:  CPAP download    ASSESSMENT:    1. Chronic diastolic heart failure (HCC)   2. Apical variant hypertrophic cardiomyopathy (HCC)   3. Persistent atrial fibrillation ([place])   4. Pulmonary hypertension, primary ([place])   5. Coronary artery disease involving native coronary artery of native heart without angina pectoris      PLAN:  In order of problems listed above:  1. Chronic diastolic CHF with last echo showing normal LVF with  EF 65-70%. NYHA III symptoms.  Volume status stable on exam, weight down 4lbs from February. She will continue on lasix 20 mg daily and potassium 10 meq daily and I will check a BMET today.  2. Apical hypertrophic cardiomyopathy: Cardiac MRI in 7/16 showed mid-apical LV hypertrophy with vigorous contraction. There is no history of syncope and no family history of SCD.  I will get a Holter monitor to assess for silent arrhythmias.  She will continue metoprolol 12.5 mg BID. Cannot increase further due to soft BP and bradycardia.  3. Persistent atrial fibrillation - she remains in sinus bradycardia on BB and Multaq. She will continue on Xarelto.  I will get her set up with EP to discuss possible redo  ablation.  4. Pulmonary hypertension: this is followed by Dr. Aundra Dubin in AHF clinic - she had a Lueders in 3/18 showing moderate pulmonary arterial hypertension with elevated PVR (7.3 WU by Fick, 9.6 WU by thermo). She has COPD but is  likely not playing a large role in the pulmonary hypertension.  PFTs in 3/18 showed minimal obstruction and restriction, there was significantly decreased DLCO, this may be due to pulmonary vascular disease.  OSA may play a role but not a large one.  V/Q scan did not suggest chronic PEs in 3/18.  RF borderline elevated (probably not significant), HIV negative, Anti-SCL70 negative.   She has had symptomatic improvement with Opsumit 10 mg daily and is waiting on insurance approval to start Adcirca 20 mg daily which was ordered on 08/15/2016.  Rheumatoid panel was normal.    5.  ASCAD with 80% small D1 - she has not had any anginal symptoms.  She will continue on statin and BB.    6.  Hyperlipidemia - LDL goal < 70.  She wil continue on Atorvastatin 61m daily.  I will get an FLP and ALT.      Medication Adjustments/Labs and Tests Ordered: Current medicines are reviewed at length with the patient today.  Concerns regarding medicines are outlined above.  Medication changes, Labs and Tests ordered today are listed in the Patient Instructions below.  There are no Patient Instructions on file for this visit.   Signed, TFransico Him MD  08/19/2016 2:08 PM    [facility] [address] [place]  [id number]: [phone] Fax: [phone]

## 2016-08-19 NOTE — Patient Instructions (Addendum)
Medication Instructions:  Your physician recommends that you continue on your current medications as directed. Please refer to the Current Medication list given to you today.   Labwork: TODAY: BMET  Your physician recommends that you return for FASTING lab work.  Testing/Procedures: None  Follow-Up: Your physician recommends that you schedule a follow-up appointment with Dr. Rayann Heman.  Your physician wants you to follow-up in: 6 months with Dr. Radford Pax. You will receive a reminder letter in the mail two months in advance. If you don't receive a letter, please call our office to schedule the follow-up appointment.   Any Other Special Instructions Will Be Listed Below (If Applicable).    If you need a refill on your cardiac medications before your next appointment, please call your pharmacy.

## 2016-08-20 DIAGNOSIS — I5032 Chronic diastolic (congestive) heart failure: Secondary | ICD-10-CM | POA: Diagnosis not present

## 2016-08-20 DIAGNOSIS — I272 Pulmonary hypertension, unspecified: Secondary | ICD-10-CM | POA: Diagnosis not present

## 2016-08-20 DIAGNOSIS — I422 Other hypertrophic cardiomyopathy: Secondary | ICD-10-CM | POA: Diagnosis not present

## 2016-08-20 DIAGNOSIS — I480 Paroxysmal atrial fibrillation: Secondary | ICD-10-CM | POA: Diagnosis not present

## 2016-08-20 DIAGNOSIS — I2510 Atherosclerotic heart disease of native coronary artery without angina pectoris: Secondary | ICD-10-CM | POA: Diagnosis not present

## 2016-08-20 DIAGNOSIS — N183 Chronic kidney disease, stage 3 (moderate): Secondary | ICD-10-CM | POA: Diagnosis not present

## 2016-08-20 LAB — BASIC METABOLIC PANEL
BUN / CREAT RATIO: 14 (ref 12–28)
BUN: 22 mg/dL (ref 8–27)
CHLORIDE: 103 mmol/L (ref 96–106)
CO2: 18 mmol/L (ref 18–29)
Calcium: 9.2 mg/dL (ref 8.7–10.3)
Creatinine, Ser: 1.52 mg/dL — ABNORMAL HIGH (ref 0.57–1.00)
GFR calc Af Amer: 39 mL/min/{1.73_m2} — ABNORMAL LOW (ref >59)
GFR calc non Af Amer: 34 mL/min/{1.73_m2} — ABNORMAL LOW (ref >59)
GLUCOSE: 55 mg/dL — AB (ref 65–99)
POTASSIUM: 4.3 mmol/L (ref 3.5–5.2)
Sodium: 140 mmol/L (ref 134–144)

## 2016-08-21 ENCOUNTER — Telehealth: Payer: Self-pay

## 2016-08-21 MED ORDER — RIVAROXABAN 15 MG PO TABS: 15.0000 mg | ORAL_TABLET | Freq: Every day | ORAL | 11 refills | Status: DC

## 2016-08-21 NOTE — Telephone Encounter (Signed)
Informed patient of results and verbal understanding expressed.  Instructed patient to DECREASE XARELTO to 15 mg daily. Patient agrees with treatment plan.

## 2016-08-21 NOTE — Telephone Encounter (Signed)
-----  Message from Sueanne Margarita, MD sent at 08/21/2016  4:27 PM EDT ----- Labs stable but creatinine clearance decreased to 47.48 so will decrease Xarelto to 70m daily.

## 2016-08-25 ENCOUNTER — Telehealth (HOSPITAL_COMMUNITY): Payer: Self-pay | Admitting: *Deleted

## 2016-08-25 NOTE — Telephone Encounter (Signed)
This may have been an episode of atrial fibrillation.  Would be unusual for Adcirca to cause atrial fibrillation, may have been coincidental.  If HR is still high, needs to come in for ECG and we can decide how to deal with the afib.  If HR is not high anymore, would try Adcirca one more time.  If same thing happens, can try her on Uptravi or Adempas instead.

## 2016-08-25 NOTE — Telephone Encounter (Signed)
Pt called complaining of reaction to Adcirca.  Patient started Adcirca last Thursday 30 minutes after taking it her resting heart rate was 131 so she took 12.40m of lopressor. An hour after taking Lopressor her heart rate was still in the 130s she took another 12.551mof lopressor and went to sleep. When patient woke up her heart rate was normal.  Patient has not taken another does of Adcirca. Patient is on her way out of town and was afraid to restart Lopressor.   Message routed to DrOrindaor advice.

## 2016-08-26 NOTE — Telephone Encounter (Signed)
Patient is out of town and does not want to restart while she is out of town. Patients heart rate is not currently high. Will have her restart when she comes back in town and call with any reactions to medication.

## 2016-08-29 ENCOUNTER — Other Ambulatory Visit: Payer: Medicare Other | Admitting: *Deleted

## 2016-08-29 DIAGNOSIS — N183 Chronic kidney disease, stage 3 (moderate): Secondary | ICD-10-CM | POA: Diagnosis not present

## 2016-08-29 DIAGNOSIS — I272 Pulmonary hypertension, unspecified: Secondary | ICD-10-CM | POA: Diagnosis not present

## 2016-08-29 DIAGNOSIS — I2510 Atherosclerotic heart disease of native coronary artery without angina pectoris: Secondary | ICD-10-CM | POA: Diagnosis not present

## 2016-08-29 DIAGNOSIS — I5032 Chronic diastolic (congestive) heart failure: Secondary | ICD-10-CM | POA: Diagnosis not present

## 2016-08-29 DIAGNOSIS — I422 Other hypertrophic cardiomyopathy: Secondary | ICD-10-CM | POA: Diagnosis not present

## 2016-08-29 DIAGNOSIS — I480 Paroxysmal atrial fibrillation: Secondary | ICD-10-CM | POA: Diagnosis not present

## 2016-08-30 DIAGNOSIS — I272 Pulmonary hypertension, unspecified: Secondary | ICD-10-CM | POA: Diagnosis not present

## 2016-08-30 DIAGNOSIS — I2510 Atherosclerotic heart disease of native coronary artery without angina pectoris: Secondary | ICD-10-CM | POA: Diagnosis not present

## 2016-08-30 DIAGNOSIS — Z87891 Personal history of nicotine dependence: Secondary | ICD-10-CM | POA: Diagnosis not present

## 2016-08-30 DIAGNOSIS — I422 Other hypertrophic cardiomyopathy: Secondary | ICD-10-CM | POA: Diagnosis not present

## 2016-08-30 DIAGNOSIS — I5032 Chronic diastolic (congestive) heart failure: Secondary | ICD-10-CM | POA: Diagnosis not present

## 2016-08-30 DIAGNOSIS — I480 Paroxysmal atrial fibrillation: Secondary | ICD-10-CM | POA: Diagnosis not present

## 2016-08-30 DIAGNOSIS — N183 Chronic kidney disease, stage 3 (moderate): Secondary | ICD-10-CM | POA: Diagnosis not present

## 2016-08-30 DIAGNOSIS — J449 Chronic obstructive pulmonary disease, unspecified: Secondary | ICD-10-CM | POA: Diagnosis not present

## 2016-08-30 DIAGNOSIS — G4733 Obstructive sleep apnea (adult) (pediatric): Secondary | ICD-10-CM | POA: Diagnosis not present

## 2016-08-30 LAB — HEPATIC FUNCTION PANEL
ALK PHOS: 76 IU/L (ref 39–117)
ALT: 36 IU/L — AB (ref 0–32)
AST: 26 IU/L (ref 0–40)
Albumin: 3.9 g/dL (ref 3.5–4.8)
BILIRUBIN, DIRECT: 0.1 mg/dL (ref 0.00–0.40)
Bilirubin Total: 0.2 mg/dL (ref 0.0–1.2)
Total Protein: 6.3 g/dL (ref 6.0–8.5)

## 2016-08-30 LAB — LIPID PANEL
CHOLESTEROL TOTAL: 126 mg/dL (ref 100–199)
Chol/HDL Ratio: 2.9 ratio (ref 0.0–4.4)
HDL: 44 mg/dL (ref >39)
LDL Calculated: 59 mg/dL (ref 0–99)
Triglycerides: 117 mg/dL (ref 0–149)
VLDL Cholesterol Cal: 23 mg/dL (ref 5–40)

## 2016-09-01 ENCOUNTER — Other Ambulatory Visit (HOSPITAL_COMMUNITY): Payer: Self-pay | Admitting: Nurse Practitioner

## 2016-09-03 DIAGNOSIS — I480 Paroxysmal atrial fibrillation: Secondary | ICD-10-CM | POA: Diagnosis not present

## 2016-09-03 DIAGNOSIS — I5032 Chronic diastolic (congestive) heart failure: Secondary | ICD-10-CM | POA: Diagnosis not present

## 2016-09-03 DIAGNOSIS — N183 Chronic kidney disease, stage 3 (moderate): Secondary | ICD-10-CM | POA: Diagnosis not present

## 2016-09-03 DIAGNOSIS — I272 Pulmonary hypertension, unspecified: Secondary | ICD-10-CM | POA: Diagnosis not present

## 2016-09-03 DIAGNOSIS — I2510 Atherosclerotic heart disease of native coronary artery without angina pectoris: Secondary | ICD-10-CM | POA: Diagnosis not present

## 2016-09-03 DIAGNOSIS — I422 Other hypertrophic cardiomyopathy: Secondary | ICD-10-CM | POA: Diagnosis not present

## 2016-09-03 NOTE — Telephone Encounter (Signed)
Ok to refill 

## 2016-09-03 NOTE — Telephone Encounter (Signed)
The pharmacy sent this refill request to the afib clinic as Roderic Palau has been refilling it. They sent it here with a message that this patient follows with Dr Radford Pax. Okay to refill under Dr Radford Pax? Please advise. Thanks, MI

## 2016-09-05 DIAGNOSIS — I2510 Atherosclerotic heart disease of native coronary artery without angina pectoris: Secondary | ICD-10-CM | POA: Diagnosis not present

## 2016-09-05 DIAGNOSIS — N183 Chronic kidney disease, stage 3 (moderate): Secondary | ICD-10-CM | POA: Diagnosis not present

## 2016-09-05 DIAGNOSIS — I272 Pulmonary hypertension, unspecified: Secondary | ICD-10-CM | POA: Diagnosis not present

## 2016-09-05 DIAGNOSIS — I422 Other hypertrophic cardiomyopathy: Secondary | ICD-10-CM | POA: Diagnosis not present

## 2016-09-05 DIAGNOSIS — I5032 Chronic diastolic (congestive) heart failure: Secondary | ICD-10-CM | POA: Diagnosis not present

## 2016-09-05 DIAGNOSIS — I480 Paroxysmal atrial fibrillation: Secondary | ICD-10-CM | POA: Diagnosis not present

## 2016-09-08 ENCOUNTER — Telehealth (HOSPITAL_COMMUNITY): Payer: Self-pay | Admitting: *Deleted

## 2016-09-08 DIAGNOSIS — N183 Chronic kidney disease, stage 3 (moderate): Secondary | ICD-10-CM | POA: Diagnosis not present

## 2016-09-08 DIAGNOSIS — I272 Pulmonary hypertension, unspecified: Secondary | ICD-10-CM | POA: Diagnosis not present

## 2016-09-08 DIAGNOSIS — I2510 Atherosclerotic heart disease of native coronary artery without angina pectoris: Secondary | ICD-10-CM | POA: Diagnosis not present

## 2016-09-08 DIAGNOSIS — I480 Paroxysmal atrial fibrillation: Secondary | ICD-10-CM | POA: Diagnosis not present

## 2016-09-08 DIAGNOSIS — I422 Other hypertrophic cardiomyopathy: Secondary | ICD-10-CM | POA: Diagnosis not present

## 2016-09-08 DIAGNOSIS — I5032 Chronic diastolic (congestive) heart failure: Secondary | ICD-10-CM | POA: Diagnosis not present

## 2016-09-08 NOTE — Telephone Encounter (Signed)
-----  Message from Larey Dresser, MD sent at 09/08/2016 11:13 AM EDT ----- Increase oxygen to 4L [place] with exertion.   ----- Message ----- From: Kennieth Rad, RN Sent: 09/08/2016   9:22 AM To: Larey Dresser, MD  Home PT called reporting that when working with patient her O2 sats would drop into the upper 70's, low 80's with very little activity.  Patient is 2L continuously.  No other symptoms reported.  I told them I would let you know and if you want to make any changes I will call them back. Thanks

## 2016-09-08 NOTE — Telephone Encounter (Signed)
I called Beth back with [facility] and she is agreeable with plan and let patient know that we are increasing her O2 to 4L with exertion.  She stated she will call us back if this doesn't help.

## 2016-09-10 ENCOUNTER — Other Ambulatory Visit: Payer: Self-pay

## 2016-09-10 ENCOUNTER — Telehealth: Payer: Self-pay | Admitting: Cardiology

## 2016-09-10 DIAGNOSIS — I2510 Atherosclerotic heart disease of native coronary artery without angina pectoris: Secondary | ICD-10-CM | POA: Diagnosis not present

## 2016-09-10 DIAGNOSIS — I5032 Chronic diastolic (congestive) heart failure: Secondary | ICD-10-CM | POA: Diagnosis not present

## 2016-09-10 DIAGNOSIS — E785 Hyperlipidemia, unspecified: Secondary | ICD-10-CM

## 2016-09-10 DIAGNOSIS — I480 Paroxysmal atrial fibrillation: Secondary | ICD-10-CM | POA: Diagnosis not present

## 2016-09-10 DIAGNOSIS — I422 Other hypertrophic cardiomyopathy: Secondary | ICD-10-CM | POA: Diagnosis not present

## 2016-09-10 DIAGNOSIS — I272 Pulmonary hypertension, unspecified: Secondary | ICD-10-CM | POA: Diagnosis not present

## 2016-09-10 DIAGNOSIS — N183 Chronic kidney disease, stage 3 (moderate): Secondary | ICD-10-CM | POA: Diagnosis not present

## 2016-09-10 NOTE — Telephone Encounter (Signed)
Patient states she checked her BP this AM and it was 80s/50s. She worked with her therapist and her pressure went up to 102/52. She states she felt fine - no dizziness or lightheadedness. She does report she feels a little fatigued.  She states she is staying hydrated. Instructed patient to check BP daily for a week and call with results. She will also make sure to check her BP if she has symptoms. She understands she will be called back if Dr. Radford Pax has medication recommendations.

## 2016-09-10 NOTE — Telephone Encounter (Signed)
Amy [name]( PTA @ [facility] ) is calling because Amy [name] blood pressure was 81/54 today and then after therapy it only went up to 91/66. She has had a history running about 90/60 on 09/08/16 and on 09/05/16 it was 102/52. It has been trending down over the past week . Please call

## 2016-09-11 NOTE — Telephone Encounter (Signed)
agree

## 2016-09-12 ENCOUNTER — Other Ambulatory Visit: Payer: Medicare Other | Admitting: *Deleted

## 2016-09-12 DIAGNOSIS — E785 Hyperlipidemia, unspecified: Secondary | ICD-10-CM | POA: Diagnosis not present

## 2016-09-12 LAB — HEPATIC FUNCTION PANEL
ALK PHOS: 126 IU/L — AB (ref 39–117)
ALT: 24 IU/L (ref 0–32)
AST: 21 IU/L (ref 0–40)
Albumin: 4 g/dL (ref 3.5–4.8)
BILIRUBIN TOTAL: 0.8 mg/dL (ref 0.0–1.2)
BILIRUBIN, DIRECT: 0.26 mg/dL (ref 0.00–0.40)
Total Protein: 7 g/dL (ref 6.0–8.5)

## 2016-09-12 LAB — LIPID PANEL
Chol/HDL Ratio: 2.6 ratio (ref 0.0–4.4)
Cholesterol, Total: 105 mg/dL (ref 100–199)
HDL: 41 mg/dL (ref >39)
LDL Calculated: 49 mg/dL (ref 0–99)
Triglycerides: 76 mg/dL (ref 0–149)
VLDL CHOLESTEROL CAL: 15 mg/dL (ref 5–40)

## 2016-09-15 DIAGNOSIS — I480 Paroxysmal atrial fibrillation: Secondary | ICD-10-CM | POA: Diagnosis not present

## 2016-09-15 DIAGNOSIS — I5032 Chronic diastolic (congestive) heart failure: Secondary | ICD-10-CM | POA: Diagnosis not present

## 2016-09-15 DIAGNOSIS — I422 Other hypertrophic cardiomyopathy: Secondary | ICD-10-CM | POA: Diagnosis not present

## 2016-09-15 DIAGNOSIS — N183 Chronic kidney disease, stage 3 (moderate): Secondary | ICD-10-CM | POA: Diagnosis not present

## 2016-09-15 DIAGNOSIS — I272 Pulmonary hypertension, unspecified: Secondary | ICD-10-CM | POA: Diagnosis not present

## 2016-09-15 DIAGNOSIS — I2510 Atherosclerotic heart disease of native coronary artery without angina pectoris: Secondary | ICD-10-CM | POA: Diagnosis not present

## 2016-09-17 DIAGNOSIS — N183 Chronic kidney disease, stage 3 (moderate): Secondary | ICD-10-CM | POA: Diagnosis not present

## 2016-09-17 DIAGNOSIS — I2510 Atherosclerotic heart disease of native coronary artery without angina pectoris: Secondary | ICD-10-CM | POA: Diagnosis not present

## 2016-09-17 DIAGNOSIS — I480 Paroxysmal atrial fibrillation: Secondary | ICD-10-CM | POA: Diagnosis not present

## 2016-09-17 DIAGNOSIS — I5032 Chronic diastolic (congestive) heart failure: Secondary | ICD-10-CM | POA: Diagnosis not present

## 2016-09-17 DIAGNOSIS — I272 Pulmonary hypertension, unspecified: Secondary | ICD-10-CM | POA: Diagnosis not present

## 2016-09-17 DIAGNOSIS — I422 Other hypertrophic cardiomyopathy: Secondary | ICD-10-CM | POA: Diagnosis not present

## 2016-09-18 DIAGNOSIS — I480 Paroxysmal atrial fibrillation: Secondary | ICD-10-CM | POA: Diagnosis not present

## 2016-09-18 DIAGNOSIS — I422 Other hypertrophic cardiomyopathy: Secondary | ICD-10-CM | POA: Diagnosis not present

## 2016-09-18 DIAGNOSIS — I5032 Chronic diastolic (congestive) heart failure: Secondary | ICD-10-CM | POA: Diagnosis not present

## 2016-09-18 DIAGNOSIS — I272 Pulmonary hypertension, unspecified: Secondary | ICD-10-CM | POA: Diagnosis not present

## 2016-09-18 DIAGNOSIS — N183 Chronic kidney disease, stage 3 (moderate): Secondary | ICD-10-CM | POA: Diagnosis not present

## 2016-09-18 DIAGNOSIS — I2510 Atherosclerotic heart disease of native coronary artery without angina pectoris: Secondary | ICD-10-CM | POA: Diagnosis not present

## 2016-09-24 DIAGNOSIS — I2510 Atherosclerotic heart disease of native coronary artery without angina pectoris: Secondary | ICD-10-CM | POA: Diagnosis not present

## 2016-09-24 DIAGNOSIS — I5032 Chronic diastolic (congestive) heart failure: Secondary | ICD-10-CM | POA: Diagnosis not present

## 2016-09-24 DIAGNOSIS — I422 Other hypertrophic cardiomyopathy: Secondary | ICD-10-CM | POA: Diagnosis not present

## 2016-09-24 DIAGNOSIS — I480 Paroxysmal atrial fibrillation: Secondary | ICD-10-CM | POA: Diagnosis not present

## 2016-09-24 DIAGNOSIS — I272 Pulmonary hypertension, unspecified: Secondary | ICD-10-CM | POA: Diagnosis not present

## 2016-09-24 DIAGNOSIS — N183 Chronic kidney disease, stage 3 (moderate): Secondary | ICD-10-CM | POA: Diagnosis not present

## 2016-09-25 DIAGNOSIS — I5032 Chronic diastolic (congestive) heart failure: Secondary | ICD-10-CM | POA: Diagnosis not present

## 2016-09-25 DIAGNOSIS — I480 Paroxysmal atrial fibrillation: Secondary | ICD-10-CM | POA: Diagnosis not present

## 2016-09-25 DIAGNOSIS — I422 Other hypertrophic cardiomyopathy: Secondary | ICD-10-CM | POA: Diagnosis not present

## 2016-09-25 DIAGNOSIS — N183 Chronic kidney disease, stage 3 (moderate): Secondary | ICD-10-CM | POA: Diagnosis not present

## 2016-09-25 DIAGNOSIS — I2510 Atherosclerotic heart disease of native coronary artery without angina pectoris: Secondary | ICD-10-CM | POA: Diagnosis not present

## 2016-09-25 DIAGNOSIS — I272 Pulmonary hypertension, unspecified: Secondary | ICD-10-CM | POA: Diagnosis not present

## 2016-09-28 ENCOUNTER — Other Ambulatory Visit: Payer: Self-pay | Admitting: Physician Assistant

## 2016-09-30 DIAGNOSIS — R079 Chest pain, unspecified: Secondary | ICD-10-CM | POA: Diagnosis not present

## 2016-10-01 ENCOUNTER — Observation Stay (HOSPITAL_COMMUNITY)
Admission: EM | Admit: 2016-10-01 | Discharge: 2016-10-02 | Disposition: A | Discharge status: Home / Self Care | Payer: Medicare Other | Attending: Cardiovascular Disease | Admitting: Cardiovascular Disease

## 2016-10-01 ENCOUNTER — Emergency Department (HOSPITAL_COMMUNITY): Payer: Medicare Other

## 2016-10-01 ENCOUNTER — Encounter (HOSPITAL_COMMUNITY): Payer: Self-pay | Admitting: Emergency Medicine

## 2016-10-01 DIAGNOSIS — Z6835 Body mass index (BMI) 35.0-35.9, adult: Secondary | ICD-10-CM | POA: Diagnosis not present

## 2016-10-01 DIAGNOSIS — R748 Abnormal levels of other serum enzymes: Secondary | ICD-10-CM

## 2016-10-01 DIAGNOSIS — I481 Persistent atrial fibrillation: Secondary | ICD-10-CM | POA: Diagnosis not present

## 2016-10-01 DIAGNOSIS — I4892 Unspecified atrial flutter: Principal | ICD-10-CM | POA: Diagnosis present

## 2016-10-01 DIAGNOSIS — Z79899 Other long term (current) drug therapy: Secondary | ICD-10-CM | POA: Insufficient documentation

## 2016-10-01 DIAGNOSIS — Z7901 Long term (current) use of anticoagulants: Secondary | ICD-10-CM

## 2016-10-01 DIAGNOSIS — I10 Essential (primary) hypertension: Secondary | ICD-10-CM | POA: Diagnosis present

## 2016-10-01 DIAGNOSIS — I130 Hypertensive heart and chronic kidney disease with heart failure and stage 1 through stage 4 chronic kidney disease, or unspecified chronic kidney disease: Secondary | ICD-10-CM | POA: Insufficient documentation

## 2016-10-01 DIAGNOSIS — J449 Chronic obstructive pulmonary disease, unspecified: Secondary | ICD-10-CM | POA: Insufficient documentation

## 2016-10-01 DIAGNOSIS — I428 Other cardiomyopathies: Secondary | ICD-10-CM | POA: Insufficient documentation

## 2016-10-01 DIAGNOSIS — I4819 Other persistent atrial fibrillation: Secondary | ICD-10-CM | POA: Diagnosis present

## 2016-10-01 DIAGNOSIS — R000 Tachycardia, unspecified: Secondary | ICD-10-CM | POA: Diagnosis not present

## 2016-10-01 DIAGNOSIS — H3530 Unspecified macular degeneration: Secondary | ICD-10-CM | POA: Insufficient documentation

## 2016-10-01 DIAGNOSIS — E039 Hypothyroidism, unspecified: Secondary | ICD-10-CM | POA: Diagnosis not present

## 2016-10-01 DIAGNOSIS — I422 Other hypertrophic cardiomyopathy: Secondary | ICD-10-CM | POA: Diagnosis not present

## 2016-10-01 DIAGNOSIS — R001 Bradycardia, unspecified: Secondary | ICD-10-CM | POA: Diagnosis not present

## 2016-10-01 DIAGNOSIS — Z87891 Personal history of nicotine dependence: Secondary | ICD-10-CM | POA: Diagnosis not present

## 2016-10-01 DIAGNOSIS — E785 Hyperlipidemia, unspecified: Secondary | ICD-10-CM | POA: Diagnosis present

## 2016-10-01 DIAGNOSIS — I517 Cardiomegaly: Secondary | ICD-10-CM | POA: Diagnosis not present

## 2016-10-01 DIAGNOSIS — G4733 Obstructive sleep apnea (adult) (pediatric): Secondary | ICD-10-CM | POA: Diagnosis present

## 2016-10-01 DIAGNOSIS — I272 Pulmonary hypertension, unspecified: Secondary | ICD-10-CM | POA: Insufficient documentation

## 2016-10-01 DIAGNOSIS — I2510 Atherosclerotic heart disease of native coronary artery without angina pectoris: Secondary | ICD-10-CM | POA: Diagnosis present

## 2016-10-01 DIAGNOSIS — M5136 Other intervertebral disc degeneration, lumbar region: Secondary | ICD-10-CM | POA: Insufficient documentation

## 2016-10-01 DIAGNOSIS — D649 Anemia, unspecified: Secondary | ICD-10-CM

## 2016-10-01 DIAGNOSIS — I480 Paroxysmal atrial fibrillation: Secondary | ICD-10-CM | POA: Diagnosis not present

## 2016-10-01 DIAGNOSIS — N183 Chronic kidney disease, stage 3 unspecified: Secondary | ICD-10-CM | POA: Diagnosis present

## 2016-10-01 DIAGNOSIS — I5032 Chronic diastolic (congestive) heart failure: Secondary | ICD-10-CM | POA: Diagnosis not present

## 2016-10-01 DIAGNOSIS — K219 Gastro-esophageal reflux disease without esophagitis: Secondary | ICD-10-CM | POA: Insufficient documentation

## 2016-10-01 DIAGNOSIS — R7989 Other specified abnormal findings of blood chemistry: Secondary | ICD-10-CM | POA: Diagnosis not present

## 2016-10-01 DIAGNOSIS — Z888 Allergy status to other drugs, medicaments and biological substances status: Secondary | ICD-10-CM | POA: Insufficient documentation

## 2016-10-01 DIAGNOSIS — I270 Primary pulmonary hypertension: Secondary | ICD-10-CM | POA: Diagnosis present

## 2016-10-01 DIAGNOSIS — R778 Other specified abnormalities of plasma proteins: Secondary | ICD-10-CM

## 2016-10-01 HISTORY — DX: Heart failure, unspecified: I50.9

## 2016-10-01 LAB — URINALYSIS, ROUTINE W REFLEX MICROSCOPIC
BILIRUBIN URINE: NEGATIVE
GLUCOSE, UA: NEGATIVE mg/dL
HGB URINE DIPSTICK: NEGATIVE
KETONES UR: NEGATIVE mg/dL
Leukocytes, UA: NEGATIVE
Nitrite: NEGATIVE
PROTEIN: NEGATIVE mg/dL
Specific Gravity, Urine: 1.005 (ref 1.005–1.030)
pH: 5 (ref 5.0–8.0)

## 2016-10-01 LAB — CBC
HEMATOCRIT: 36.5 % (ref 36.0–46.0)
Hemoglobin: 11.6 g/dL — ABNORMAL LOW (ref 12.0–15.0)
MCH: 28.6 pg (ref 26.0–34.0)
MCHC: 31.8 g/dL (ref 30.0–36.0)
MCV: 89.9 fL (ref 78.0–100.0)
Platelets: 255 10*3/uL (ref 150–400)
RBC: 4.06 MIL/uL (ref 3.87–5.11)
RDW: 17.4 % — AB (ref 11.5–15.5)
WBC: 6.9 10*3/uL (ref 4.0–10.5)

## 2016-10-01 LAB — COMPREHENSIVE METABOLIC PANEL
ALK PHOS: 115 U/L (ref 38–126)
ALT: 39 U/L (ref 14–54)
AST: 39 U/L (ref 15–41)
Albumin: 3.4 g/dL — ABNORMAL LOW (ref 3.5–5.0)
Anion gap: 6 (ref 5–15)
BUN: 14 mg/dL (ref 6–20)
CALCIUM: 8.6 mg/dL — AB (ref 8.9–10.3)
CO2: 20 mmol/L — ABNORMAL LOW (ref 22–32)
CREATININE: 1.21 mg/dL — AB (ref 0.44–1.00)
Chloride: 112 mmol/L — ABNORMAL HIGH (ref 101–111)
GFR calc Af Amer: 51 mL/min — ABNORMAL LOW (ref >60)
GFR calc non Af Amer: 44 mL/min — ABNORMAL LOW (ref >60)
GLUCOSE: 69 mg/dL (ref 65–99)
POTASSIUM: 4 mmol/L (ref 3.5–5.1)
Sodium: 138 mmol/L (ref 135–145)
TOTAL PROTEIN: 6.4 g/dL — AB (ref 6.5–8.1)
Total Bilirubin: 0.8 mg/dL (ref 0.3–1.2)

## 2016-10-01 LAB — I-STAT TROPONIN, ED: Troponin i, poc: 0.11 ng/mL (ref 0.00–0.08)

## 2016-10-01 MED ORDER — TADALAFIL (PAH) 20 MG PO TABS
20.0000 mg | ORAL_TABLET | Freq: Every day | ORAL | Status: DC
  Administered 2016-10-02: 20 mg via ORAL
  Filled 2016-10-01: qty 1

## 2016-10-01 MED ORDER — MACITENTAN 10 MG PO TABS
10.0000 mg | ORAL_TABLET | Freq: Every day | ORAL | Status: DC
  Administered 2016-10-02: 10 mg via ORAL
  Filled 2016-10-01: qty 1

## 2016-10-01 MED ORDER — EZETIMIBE 10 MG PO TABS: 10.0000 mg | ORAL_TABLET | Freq: Every evening | ORAL | Status: DC

## 2016-10-01 MED ORDER — NITROGLYCERIN 0.4 MG SL SUBL: 0.4000 mg | SUBLINGUAL_TABLET | SUBLINGUAL | Status: DC | PRN

## 2016-10-01 MED ORDER — MULTIVITAMINS PO CAPS: 1.0000 | ORAL_CAPSULE | Freq: Every day | ORAL | Status: DC

## 2016-10-01 MED ORDER — METOPROLOL TARTRATE 12.5 MG HALF TABLET
12.5000 mg | ORAL_TABLET | Freq: Two times a day (BID) | ORAL | Status: DC
  Administered 2016-10-02 (×2): 12.5 mg via ORAL
  Filled 2016-10-01 (×2): qty 1

## 2016-10-01 MED ORDER — ALPRAZOLAM 0.25 MG PO TABS: 0.2500 mg | ORAL_TABLET | Freq: Two times a day (BID) | ORAL | Status: DC | PRN

## 2016-10-01 MED ORDER — POTASSIUM CHLORIDE CRYS ER 10 MEQ PO TBCR
10.0000 meq | EXTENDED_RELEASE_TABLET | Freq: Every day | ORAL | Status: DC
  Administered 2016-10-02: 10 meq via ORAL
  Filled 2016-10-01: qty 1

## 2016-10-01 MED ORDER — SODIUM CHLORIDE 0.9 % IV SOLN: 250.0000 mL | INTRAVENOUS | Status: DC | PRN

## 2016-10-01 MED ORDER — COLCHICINE 0.6 MG PO TABS
0.6000 mg | ORAL_TABLET | Freq: Every day | ORAL | Status: DC
  Administered 2016-10-02: 0.6 mg via ORAL
  Filled 2016-10-01: qty 1

## 2016-10-01 MED ORDER — RIVAROXABAN 15 MG PO TABS
15.0000 mg | ORAL_TABLET | Freq: Every day | ORAL | Status: DC
  Administered 2016-10-02: 15 mg via ORAL
  Filled 2016-10-01 (×2): qty 1

## 2016-10-01 MED ORDER — CYCLOBENZAPRINE HCL 10 MG PO TABS: 5.0000 mg | ORAL_TABLET | Freq: Three times a day (TID) | ORAL | Status: DC | PRN

## 2016-10-01 MED ORDER — ADULT MULTIVITAMIN W/MINERALS CH
1.0000 | ORAL_TABLET | Freq: Every day | ORAL | Status: DC
  Administered 2016-10-02: 1 via ORAL
  Filled 2016-10-01: qty 1

## 2016-10-01 MED ORDER — DILTIAZEM LOAD VIA INFUSION
10.0000 mg | Freq: Once | INTRAVENOUS | Status: DC
  Filled 2016-10-01: qty 10

## 2016-10-01 MED ORDER — PANTOPRAZOLE SODIUM 40 MG PO TBEC
40.0000 mg | DELAYED_RELEASE_TABLET | Freq: Every day | ORAL | Status: DC
  Administered 2016-10-02: 40 mg via ORAL
  Filled 2016-10-01: qty 1

## 2016-10-01 MED ORDER — VITAMIN D 1000 UNITS PO TABS
2000.0000 [IU] | ORAL_TABLET | Freq: Every day | ORAL | Status: DC
  Administered 2016-10-02: 2000 [IU] via ORAL
  Filled 2016-10-01: qty 2

## 2016-10-01 MED ORDER — SODIUM CHLORIDE 0.9 % IV BOLUS (SEPSIS)
500.0000 mL | Freq: Once | INTRAVENOUS | Status: AC
  Administered 2016-10-01: 500 mL via INTRAVENOUS

## 2016-10-01 MED ORDER — FUROSEMIDE 20 MG PO TABS
20.0000 mg | ORAL_TABLET | Freq: Every day | ORAL | Status: DC
  Administered 2016-10-02: 20 mg via ORAL
  Filled 2016-10-01: qty 1

## 2016-10-01 MED ORDER — ATORVASTATIN CALCIUM 80 MG PO TABS: 80.0000 mg | ORAL_TABLET | Freq: Every day | ORAL | Status: DC

## 2016-10-01 MED ORDER — ONDANSETRON HCL 4 MG/2ML IJ SOLN: 4.0000 mg | Freq: Four times a day (QID) | INTRAMUSCULAR | Status: DC | PRN

## 2016-10-01 MED ORDER — ACETAMINOPHEN 325 MG PO TABS: 650.0000 mg | ORAL_TABLET | ORAL | Status: DC | PRN

## 2016-10-01 MED ORDER — ZOLPIDEM TARTRATE 5 MG PO TABS: 5.0000 mg | ORAL_TABLET | Freq: Every evening | ORAL | Status: DC | PRN

## 2016-10-01 MED ORDER — DILTIAZEM LOAD VIA INFUSION: 20.0000 mg | Freq: Once | INTRAVENOUS | Status: DC

## 2016-10-01 MED ORDER — DILTIAZEM HCL 100 MG IV SOLR
5.0000 mg/h | INTRAVENOUS | Status: DC
  Administered 2016-10-01: 5 mg/h via INTRAVENOUS
  Filled 2016-10-01: qty 100

## 2016-10-01 MED ORDER — SODIUM CHLORIDE 0.9% FLUSH
3.0000 mL | Freq: Two times a day (BID) | INTRAVENOUS | Status: DC
  Administered 2016-10-02 (×2): 3 mL via INTRAVENOUS

## 2016-10-01 MED ORDER — LEVOTHYROXINE SODIUM 100 MCG PO TABS
100.0000 ug | ORAL_TABLET | Freq: Every day | ORAL | Status: DC
  Administered 2016-10-02: 100 ug via ORAL
  Filled 2016-10-01: qty 1

## 2016-10-01 MED ORDER — SODIUM CHLORIDE 0.9% FLUSH: 3.0000 mL | INTRAVENOUS | Status: DC | PRN

## 2016-10-01 MED ORDER — DILTIAZEM LOAD VIA INFUSION: 10.0000 mg | Freq: Once | INTRAVENOUS | Status: DC

## 2016-10-01 MED ORDER — DRONEDARONE HCL 400 MG PO TABS
400.0000 mg | ORAL_TABLET | Freq: Two times a day (BID) | ORAL | Status: DC
  Administered 2016-10-02: 400 mg via ORAL
  Filled 2016-10-01 (×2): qty 1

## 2016-10-01 NOTE — ED Notes (Signed)
ED Provider at bedside. 

## 2016-10-01 NOTE — ED Notes (Signed)
Dr. Alvino Chapel notified of elevated I-stat Trop

## 2016-10-01 NOTE — ED Triage Notes (Signed)
PER [facility] patient report heart palpitations since 10 am today. Patient has history of afib. EMS noted a sinus tach rhythm that converted into afib 70-140 rate. Patient denies any chest pain today. Previous history of heart ablation. Patient complains of generalized weakness. Patient is on 2.5 L of oxygen. History of COPD, CHF, and hypertension. 500 ns bolus in route to facility with EMS

## 2016-10-01 NOTE — ED Provider Notes (Signed)
[facility] DEPT Provider Note   CSN: [id number] Arrival date & time: 10/01/16  1459     History   Chief Complaint No chief complaint on file.   HPI Amy [name] is a 71 y.o. female sending with three-day history of tachycardia and palpitations.  Patient with an extensive cardiac and pulmonary history including CAD, CHF, A. fib, OSA, NSTEMI, and pulmonary hypertension. states that on Monday she had acute onset chest pain while at rest. She called EMS, they came and evaluated her. She was told to take an extra Lopressor, and her symptoms resolved. She no longer had chest pain, and felt her heart was being normally. Since Monday, patient reports she has had consistent feeling of tachycardia and palpitations. This is not changed with her at home medications. She denies any further incidents of chest pain. She does not know she has been feeling more short of breath than normal. She is on oxygen at home, since her hospitalization in March. She denies fever, chills, sore throat, cough, current chest pain, abdominal pain, or urinary symptoms. She has had not had any further episodes of nausea or vomiting since Monday, and she has had normal bowel movements. Patient denies dizziness, weakness, or syncope. Patient is on blood thinners, and has been taking them daily as prescribed. She has had no medication changes in the last month. She took 0.5 tablets of lopressor today. She denies any extended travel or extended time sitting recently. She is a former smoker (quit 2010).  Per chart review, TSH in 05/2016 was 0.34, no change in levothyroxine dose at that time. ECHO in  06/2016 showed severe LVH, with LVEF 65-70%, with dilated LA and RA, and PA peak pressure of 86 mmHg.    HPI  Past Medical History:  Diagnosis Date  . Apical variant hypertrophic cardiomyopathy ([place])    Diagnosed by echo and ECG 11/13  . Atypical atrial flutter ([place])   . CAD (coronary artery disease)    a. LHC in  10/2011 demonstrated non-obstructive disease and an 80% lesion in a small D1 treated medically.   . CHF (congestive heart failure) ([place])   . Chronic diastolic heart failure ([place])   . CKD (chronic kidney disease), stage III   . COPD (chronic obstructive pulmonary disease) ([place])   . Dizziness    had PT for being off balance - in approx. 2012  . GERD (gastroesophageal reflux disease)   . Hyperlipidemia   . Hypertension   . Hypothyroid   . Lumbar disc disease   . Macular degeneration   . OSA (obstructive sleep apnea)   . Persistent atrial fibrillation ([place])       . Pulmonary hypertension ([place])    a. [place] 05/2013 showed mild PAH with normal PCWP and RA pressure, could be related to OSA and low oxygen saturation.   . Refusal of blood transfusions as patient is Jehovah's Witness   . Sinus bradycardia     Patient Active Problem List   Diagnosis Date Noted  . Paroxysmal atrial fibrillation with rapid ventricular response ([place]) 10/01/2016  . PAF (paroxysmal atrial fibrillation) ([place]) 10/01/2016  . Dyspnea   . Pulmonary hypertension, primary ([place])   . Elevated troponin   . NSTEMI (non-ST elevated myocardial infarction) ([place]) 10/29/2014  . Chest pain 10/28/2014  . Atrial flutter with RVR 10/28/2014  . Morbid obesity due to excess calories ([place]) 07/19/2014  . Chronic anticoagulation 07/19/2014  . CAD (coronary artery disease) 07/19/2014  . Chronic renal insufficiency, stage III (  moderate) 07/19/2014  . Dyslipidemia 04/12/2014  . Hypoxia 04/25/2013  . OSA (obstructive sleep apnea) 04/13/2013  . Apical variant hypertrophic cardiomyopathy ([place]) 03/05/2012  . Chronic diastolic heart failure ([place])   . Persistent atrial fibrillation ([place])   . Sinus bradycardia   . Asthmatic bronchitis , chronic ([place]) 03/04/2012  . Hypothyroidism 03/04/2012  . Essential hypertension 03/04/2012  . DOE (dyspnea on exertion) 12/24/2010    Past Surgical History:  Procedure Laterality Date  . CARDIAC  CATHETERIZATION     normal coronary arteries  . CARDIAC CATHETERIZATION N/A 10/31/2014   Procedure: Left Heart Cath and Coronary Angiography;  Surgeon: Leonie Man, MD;  Location: [facility];  Service: Cardiovascular;  Laterality: N/A;  . CATARACT EXTRACTION Bilateral   . CESAREAN SECTION  1983  . ELECTROPHYSIOLOGIC STUDY N/A 04/24/2015   Procedure: Atrial Fibrillation Ablation;  Surgeon: [name], MD;  Location: [facility];  Service: Cardiovascular;  Laterality: N/A;  . RIGHT HEART CATH N/A 06/25/2016   Procedure: Right Heart Cath;  Surgeon: Sherren Mocha, MD;  Location: [facility];  Service: Cardiovascular;  Laterality: N/A;  . RIGHT HEART CATHETERIZATION N/A 06/01/2013   Procedure: RIGHT HEART CATH;  Surgeon: Larey Dresser, MD;  [facility];  Service: Cardiovascular;  Laterality: N/A;  . TEE WITHOUT CARDIOVERSION N/A 04/23/2015   Procedure: TRANSESOPHAGEAL ECHOCARDIOGRAM (TEE);  Surgeon: Josue Hector, MD;  Location: [facility] ENDOSCOPY;  Service: Cardiovascular;  Laterality: N/A;    OB History    No data available       Home Medications    Prior to Admission medications   Medication Sig Start Date End Date Taking? Authorizing Provider  atorvastatin (LIPITOR) 80 MG tablet Take 1 tablet (80 mg total) by mouth daily with lunch. 02/08/15  Yes Sherran Needs, NP  Cholecalciferol (VITAMIN D) 2000 UNITS CAPS Take 2,000 Units by mouth daily.    Yes [provider]  colchicine 0.6 MG tablet Take 0.6 mg by mouth daily.    Yes [provider]  ezetimibe (ZETIA) 10 MG tablet TAKE 1 TABLET (10 MG TOTAL) BY MOUTH DAILY. Patient taking differently: TAKE 1 TABLET (10 MG TOTAL) BY MOUTH IN THE EVENING 03/03/16  Yes Turner, Eber Hong, MD  furosemide (LASIX) 20 MG tablet Take 1 tablet (20 mg total) by mouth daily. 08/13/16  Yes Larey Dresser, MD  Glycopyrrolate-Formoterol (BEVESPI AEROSPHERE) 9-4.8 MCG/ACT AERO Inhale 2 puffs into the lungs 2 (two)  times daily. Patient taking differently: Inhale 1 puff into the lungs 2 (two) times daily.  06/17/16  Yes Tanda Rockers, MD  levothyroxine (SYNTHROID, LEVOTHROID) 100 MCG tablet Take 100 mcg by mouth daily before breakfast.   Yes [provider]  Macitentan (OPSUMIT) 10 MG TABS Take 1 tablet (10 mg total) by mouth daily. 07/23/16  Yes Larey Dresser, MD  metoprolol tartrate (LOPRESSOR) 25 MG tablet TAKE 0.5 TABLETS (12.5 MG TOTAL) BY MOUTH 2 (TWO) TIMES DAILY. 09/30/16  Yes Turner, Eber Hong, MD  MULTAQ 400 MG tablet TAKE 1 TABLET BY MOUTH TWICE A DAY WITH A MEAL 09/03/16  Yes Turner, Eber Hong, MD  Multiple Vitamin (MULTIVITAMIN) capsule Take 1 capsule by mouth daily.   Yes [provider]  pantoprazole (PROTONIX) 40 MG tablet TAKE 1 TABLET (40 MG TOTAL) BY MOUTH DAILY. 01/25/16  Yes Allred, Jeneen Rinks, MD  potassium chloride SA (K-DUR,KLOR-CON) 20 MEQ tablet Take 0.5 tablets (10 mEq total) by mouth daily. Patient taking differently:  Take 10 mEq by mouth at bedtime.  08/13/16  Yes Larey Dresser, MD  Rivaroxaban (XARELTO) 15 MG TABS tablet Take 1 tablet (15 mg total) by mouth daily with supper. 08/21/16 08/16/17 Yes Turner, Eber Hong, MD  tadalafil, PAH, (ADCIRCA) 20 MG tablet Take 1 tablet (20 mg total) by mouth daily. 08/18/16  Yes Larey Dresser, MD  UNABLE TO FIND Med Name: CPAP  [facility]   Yes [provider]  cyclobenzaprine (FLEXERIL) 5 MG tablet Take 1 tablet (5 mg total) by mouth 3 (three) times daily as needed for muscle spasms. 06/26/16   Eulogio Bear U, DO  NITROSTAT 0.4 MG SL tablet PLACE 1 TABLET (0.4 MG TOTAL) UNDER THE TONGUE EVERY 5 (FIVE) MINUTES AS NEEDED FOR CHEST PAIN. Patient not taking: Reported on 10/01/2016 06/27/16   Sueanne Margarita, MD    Family History Family History  Problem Relation Age of Onset  . Heart disease Sister        Good Pastures Syndrome  . Gout Father   . Lung cancer Father        smoked  . Breast cancer Paternal Aunt     Social  History Social History  Substance Use Topics  . Smoking status: Former Smoker    Packs/day: 0.50    Years: 44.00    Types: Cigarettes    Quit date: 04/07/2009  . Smokeless tobacco: Never Used  . Alcohol use No     Allergies   Amiodarone and Zyban [bupropion]   Review of Systems Review of Systems  All other systems reviewed and are negative.    Physical Exam Updated Vital Signs BP 108/61   Pulse (!) 52   Temp 97.5 F (36.4 C) (Oral)   Resp 19   Ht 5' 3" (1.6 m)   Wt 86.2 kg (190 lb)   SpO2 100%   BMI 33.66 kg/m   Physical Exam  Constitutional: She is oriented to person, place, and time. She appears well-developed and well-nourished. No distress.  HENT:  Head: Normocephalic and atraumatic.  Mouth/Throat: Uvula is midline, oropharynx is clear and moist and mucous membranes are normal.  Eyes: Conjunctivae and EOM are normal. Pupils are equal, round, and reactive to light.  Neck: Normal range of motion. Neck supple. Carotid bruit is not present. No thyromegaly present.  Cardiovascular: Normal heart sounds and intact distal pulses.  An irregularly irregular rhythm present. Tachycardia present.   No swelling, redness, or pain to the legs.  Pulmonary/Chest: Effort normal and breath sounds normal. No respiratory distress. She has no wheezes.  Abdominal: Soft. Bowel sounds are normal. She exhibits no distension. There is no tenderness.  Musculoskeletal: Normal range of motion.  Neurological: She is alert and oriented to person, place, and time. GCS eye subscore is 4. GCS verbal subscore is 5. GCS motor subscore is 6.  Skin: Skin is warm and dry. She is not diaphoretic.  Psychiatric: She has a normal mood and affect.  Nursing note and vitals reviewed.    ED Treatments / Results  Labs (all labs ordered are listed, but only abnormal results are displayed) Labs Reviewed  CBC - Abnormal; Notable for the following:       Result Value   Hemoglobin 11.6 (*)    RDW 17.4 (*)      All other components within normal limits  COMPREHENSIVE METABOLIC PANEL - Abnormal; Notable for the following:    Chloride 112 (*)    CO2 20 (*)  Creatinine, Ser 1.21 (*)    Calcium 8.6 (*)    Total Protein 6.4 (*)    Albumin 3.4 (*)    GFR calc non Af Amer 44 (*)    GFR calc Af Amer 51 (*)    All other components within normal limits  URINALYSIS, ROUTINE W REFLEX MICROSCOPIC - Abnormal; Notable for the following:    Color, Urine STRAW (*)    All other components within normal limits  I-STAT TROPOININ, ED - Abnormal; Notable for the following:    Troponin i, poc 0.11 (*)    All other components within normal limits    EKG  EKG Interpretation  Date/Time:  Wednesday October 01 2016 15:10:58 EDT Ventricular Rate:  86 PR Interval:    QRS Duration: 79 QT Interval:  362 QTC Calculation: 433 R Axis:   70 Text Interpretation:  Atrial fibrillation Probable LVH with secondary repol abnrm Confirmed by Alvino Chapel  MD, Ovid Curd [phone]) on 10/01/2016 3:14:29 PM       Radiology Dg Chest 2 View  Result Date: 10/01/2016 CLINICAL DATA:  71 year old female with heart palpitations for the past 3 days EXAM: CHEST  2 VIEW COMPARISON:  Prior chest x-ray 06/25/2016 FINDINGS: Cardiomegaly with left atrial heart enlargement appears similar compared to prior imaging. Atherosclerotic calcifications again noted in the transverse aorta. Chronic mild vascular congestion without overt edema. Linear scarring again noted in the lingula. No new airspace consolidation, pleural effusion or pneumothorax. IMPRESSION: 1. Stable cardiomegaly with left atrial and left ventricular enlargement. 2. Chronic lingular atelectasis/scarring, unchanged. 3.  Aortic Atherosclerosis (ICD10-170.0) Electronically Signed   By: Jacqulynn Cadet M.D.   On: 10/01/2016 17:37    Procedures Procedures (including critical care time)  Medications Ordered in ED Medications  diltiazem (CARDIZEM) 100 mg in dextrose 5 % 100 mL (1 mg/mL)  infusion (0 mg/hr Intravenous Stopped 10/01/16 1745)  diltiazem (CARDIZEM) 1 mg/mL load via infusion 10 mg (0 mg Intravenous Hold 10/01/16 1630)  atorvastatin (LIPITOR) tablet 80 mg (not administered)  Vitamin D CAPS 2,000 Units (not administered)  colchicine tablet 0.6 mg (not administered)  cyclobenzaprine (FLEXERIL) tablet 5 mg (not administered)  ezetimibe (ZETIA) tablet 10 mg (not administered)  furosemide (LASIX) tablet 20 mg (not administered)  levothyroxine (SYNTHROID, LEVOTHROID) tablet 100 mcg (not administered)  Macitentan TABS 10 mg (not administered)  metoprolol tartrate (LOPRESSOR) tablet 12.5 mg (not administered)  multivitamin capsule 1 capsule (not administered)  pantoprazole (PROTONIX) EC tablet 40 mg (not administered)  potassium chloride (K-DUR,KLOR-CON) CR tablet 10 mEq (not administered)  Rivaroxaban (XARELTO) tablet 15 mg (not administered)  tadalafil (PAH) (ADCIRCA) tablet 20 mg (not administered)  dronedarone (MULTAQ) tablet 400 mg (not administered)  sodium chloride 0.9 % bolus 500 mL (0 mLs Intravenous Stopped 10/01/16 1655)     Initial Impression / Assessment and Plan / ED Course  I have reviewed the triage vital signs and the nursing notes.  Pertinent labs & imaging results that were available during my care of the patient were reviewed by me and considered in my medical decision making (see chart for details).  Clinical Course as of Oct 01 2100  Wed Oct 01, 2016  1549 On initial assessment, patient lying comfortably in bed in no acute distress. Tachycardic and blood pressure is soft, despite 500 mL en route to hospital. EKG shows A. fib with rate varying between 95 and 140. Patient speaking in full sentences. Will order basic labs, UA, troponin, and CXR for further evaluation. Will give 500 mL  bolus, diltiazem bolus, and diltiazem drip. Patient on blood thinners and without leg pain or swelling. Low suspicion for PE at this time. Discussed case with attending  and Dr. Alvino Chapel assessed pt.  [[place]]  1630 Patient's blood pressure is soft, will hold the bolus and to start diltiazem drip. Patient states that her blood pressure normally rests between mid 90s over 50s.  [[place]]  8329 On reassessment, pt states that she is feeling better. She no longer feels like she  HR has improved to 50-60. However, BP is still soft, troponin increased to 0.11, and Hgb decreased to 11.6. Will contact cardiology about admission. Increased concern as pt has a very extensive cardiac history, and will need to trend troponin.   [[place]]  1717 Discussed case with Dr. Burt Knack in cardiology. Will evaluate patient for admission.   Pt to be admitted to cardiology.  [[place]]    Clinical Course User Index [[place]] Alvena Kiernan, PA-C     Final Clinical Impressions(s) / ED Diagnoses   Final diagnoses:  Paroxysmal atrial fibrillation ([place])  Elevated troponin  Anemia, unspecified type    New Prescriptions New Prescriptions   No medications on file     Erick Alley 10/01/16 2102    Davonna Belling, MD 10/01/16 2306

## 2016-10-01 NOTE — H&P (Signed)
History and Physical   Patient ID: Amy [name] MRN: [id number], DOB/AGE: 1946/03/11 71 y.o. Date of Encounter: 10/01/2016  Primary Physician: Aretta Nip, MD Primary Cardiologist: Dr Radford Pax, Dr Aundra Dubin for CHF, Dr Rayann Heman for EP  Chief Complaint:  Atrial fib, RVR  HPI: Amy MASSE is a 71 y.o. 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  She also has apical HCOM by cardiac MRI and mild to moderate pulmonary HTN with PAP 45/25mHg at cath on Opsumit and Adcirca.?amio toxicity, breakthrough on Multaq, s/p afib ablation 04/2015 but has been having breakthroughs.   Has appt w/ Dr ARayann Heman08/04/2016. Has appt w/ Dr MAundra Dubin06/28 at 2:30 pm   Monday, pt HR began going up, she took extra 1/2 Lopressor. HR variable, she vomited, felt bad. EMS called and Lopressor must have kicked in because HR improved. She had some CP, had no ASA so took 1/2 BC powder. CP resolved. She felt she had gone back into SR.   Tuesday, HR went up again, took extra 1/2 Lopressor, sx resolved after   Today, it happened again, she had just taken her am Lopressor so did not take extra. Visiting RN came and SBP 90, HR elevated, so pt came to ER by EMS. In the ER, pt got IV Cardizem and converted to SR.   No CP since Monday. She is compliant w/ rx. Her weight has been stable between 190 and 192 lbs. Uses BiPAP every night. No bleeding issues since 06/2016, compliant w/ Xarelto.   Past Medical History:  Diagnosis Date  . Apical variant hypertrophic cardiomyopathy ([place]    Diagnosed by echo and ECG 11/13  . Atypical atrial flutter ([place]   . CAD (coronary artery disease)    a. LHC in 10/2011 demonstrated non-obstructive disease and an 80% lesion in a small D1 treated medically.   . CHF (congestive heart failure) ([place]   . Chronic diastolic heart failure ([place]   .  CKD (chronic kidney disease), stage III   . COPD (chronic obstructive pulmonary disease) ([place]   . Dizziness    had PT for being off balance - in approx. 2012  . GERD (gastroesophageal reflux disease)   . Hyperlipidemia   . Hypertension   . Hypothyroid   . Lumbar disc disease   . Macular degeneration   . OSA (obstructive sleep apnea)   . Persistent atrial fibrillation ([facility]       . Pulmonary hypertension ([place]    a. [place] showed mild PAH with normal PCWP and RA pressure, could be related to OSA and low oxygen saturation.   . Refusal of blood transfusions as patient is Jehovah's Witness   . Sinus bradycardia     Surgical History:  Past Surgical History:  Procedure Laterality Date  . CARDIAC CATHETERIZATION     normal coronary arteries  . CARDIAC CATHETERIZATION N/A 10/31/2014   Procedure: Left Heart Cath and Coronary Angiography;  Surgeon: DLeonie Man MD;  [place];  Service: Cardiovascular;  Laterality: N/A;  . CATARACT EXTRACTION Bilateral   . CESAREAN SECTION  1983  . ELECTROPHYSIOLOGIC STUDY N/A 04/24/2015   Procedure: Atrial Fibrillation Ablation;  Surgeon: JThompson Grayer MD;  [facility];  Service: Cardiovascular;  Laterality: N/A;  . RIGHT HEART CATH N/A 06/25/2016   Procedure: Right Heart Cath;  Surgeon: Sherren Mocha, MD;  Location: [place] [facility];  Service: Cardiovascular;  Laterality: N/A;  . RIGHT HEART CATHETERIZATION N/A 06/01/2013   Procedure: RIGHT HEART CATH;  Surgeon: Larey Dresser, MD;  [facility];  Service: Cardiovascular;  Laterality: N/A;  . TEE WITHOUT CARDIOVERSION N/A 04/23/2015   Procedure: TRANSESOPHAGEAL ECHOCARDIOGRAM (TEE);  Surgeon: Josue Hector, MD;  Location: [facility] ENDOSCOPY;  Service: Cardiovascular;  Laterality: N/A;     I have reviewed the patient's current medications. Prior to Admission medications   Medication Sig Start Date End Date Taking? Authorizing Provider  atorvastatin  (LIPITOR) 80 MG tablet Take 1 tablet (80 mg total) by mouth daily with lunch. 02/08/15  Yes Sherran Needs, NP  Cholecalciferol (VITAMIN D) 2000 UNITS CAPS Take 2,000 Units by mouth daily.    Yes [provider]  colchicine 0.6 MG tablet Take 0.6 mg by mouth daily.    Yes [provider]  ezetimibe (ZETIA) 10 MG tablet TAKE 1 TABLET (10 MG TOTAL) BY MOUTH DAILY. Patient taking differently: TAKE 1 TABLET (10 MG TOTAL) BY MOUTH IN THE EVENING 03/03/16  Yes Turner, Eber Hong, MD  furosemide (LASIX) 20 MG tablet Take 1 tablet (20 mg total) by mouth daily. 08/13/16  Yes Larey Dresser, MD  Glycopyrrolate-Formoterol (BEVESPI AEROSPHERE) 9-4.8 MCG/ACT AERO Inhale 2 puffs into the lungs 2 (two) times daily. Patient taking differently: Inhale 1 puff into the lungs 2 (two) times daily.  06/17/16  Yes Tanda Rockers, MD  levothyroxine (SYNTHROID, LEVOTHROID) 100 MCG tablet Take 100 mcg by mouth daily before breakfast.   Yes [provider]  Macitentan (OPSUMIT) 10 MG TABS Take 1 tablet (10 mg total) by mouth daily. 07/23/16  Yes Larey Dresser, MD  metoprolol tartrate (LOPRESSOR) 25 MG tablet TAKE 0.5 TABLETS (12.5 MG TOTAL) BY MOUTH 2 (TWO) TIMES DAILY. 09/30/16  Yes Turner, Eber Hong, MD  MULTAQ 400 MG tablet TAKE 1 TABLET BY MOUTH TWICE A DAY WITH A MEAL 09/03/16  Yes Turner, Eber Hong, MD  Multiple Vitamin (MULTIVITAMIN) capsule Take 1 capsule by mouth daily.   Yes [provider]  pantoprazole (PROTONIX) 40 MG tablet TAKE 1 TABLET (40 MG TOTAL) BY MOUTH DAILY. 01/25/16  Yes Allred, Jeneen Rinks, MD  potassium chloride SA (K-DUR,KLOR-CON) 20 MEQ tablet Take 0.5 tablets (10 mEq total) by mouth daily. Patient taking differently: Take 10 mEq by mouth at bedtime.  08/13/16  Yes Larey Dresser, MD  Rivaroxaban (XARELTO) 15 MG TABS tablet Take 1 tablet (15 mg total) by mouth daily with supper. 08/21/16 08/16/17 Yes Turner, Eber Hong, MD  tadalafil, PAH, (ADCIRCA) 20 MG tablet Take 1 tablet (20  mg total) by mouth daily. 08/18/16  Yes Larey Dresser, MD  UNABLE TO FIND Med Name: CPAP  DME-[facility]   Yes [provider]  cyclobenzaprine (FLEXERIL) 5 MG tablet Take 1 tablet (5 mg total) by mouth 3 (three) times daily as needed for muscle spasms. 06/26/16   Eulogio Bear U, DO  NITROSTAT 0.4 MG SL tablet PLACE 1 TABLET (0.4 MG TOTAL) UNDER THE TONGUE EVERY 5 (FIVE) MINUTES AS NEEDED FOR CHEST PAIN. Patient not taking: Reported on 10/01/2016 06/27/16   Sueanne Margarita, MD   Scheduled Meds: . diltiazem  10 mg Intravenous Once   Continuous Infusions: . diltiazem (CARDIZEM) infusion Stopped (10/01/16 1745)   PRN Meds:.  Allergies:  Allergies  Allergen Reactions  . Amiodarone     Dyspnea - felt to  be 2/2 amio lung toxicity Has tolerated Omnipaque   . Zyban [Bupropion] Itching    Social History   Social History  . Marital status: Widowed    Spouse name: divorced.  . Number of children: 1  . Years of education: N/A   Occupational History  . retired. still works for [employer].     Social History Main Topics  . Smoking status: Former Smoker    Packs/day: 0.50    Years: 44.00    Types: Cigarettes    Quit date: 04/07/2009  . Smokeless tobacco: Never Used  . Alcohol use No  . Drug use: No  . Sexual activity: No   Other Topics Concern  . Not on file   Social History Narrative   Pt lives alone with 2 dogs.     Family History  Problem Relation Age of Onset  . Heart disease Sister        Good Pastures Syndrome  . Gout Father   . Lung cancer Father        smoked  . Breast cancer Paternal Aunt    Family Status  Relation Status  . Sister Deceased  . Father Deceased  . Mother Deceased  . Ethlyn Daniels (Not Specified)  . MGM Deceased  . MGF Deceased  . PGM Deceased  . PGF Deceased    Review of Systems:   Full 14-point review of systems otherwise negative except as noted above.  Physical Exam: Blood pressure 115/86, pulse 83, temperature 97.5 F (36.4 C),  temperature source Oral, resp. rate (!) 22, height 5' 3" (1.6 m), weight 190 lb (86.2 kg), SpO2 94 %. General: Well developed, obese, well nourished,female in no acute distress. Head: Normocephalic, atraumatic, sclera non-icteric, no xanthomas, nares are without discharge. Dentition: Fair Neck: No carotid bruits. JVD not elevated. No thyromegally Lungs: Good expansion bilaterally. without wheezes or rhonchi.  Heart: Regular rate and rhythm with S1 S2.  No S3 or S4. Soft murmur, no rubs, or gallops appreciated. Abdomen: Soft, non-tender, non-distended with normoactive bowel sounds. No hepatomegaly. No rebound/guarding. No obvious abdominal masses. Msk:  Strength and tone appear normal for age. No joint deformities or effusions, no spine or costo-vertebral angle tenderness. Extremities: No clubbing or cyanosis. No edema.  Distal pedal pulses are 2+ in 4 extrem Neuro: Alert and oriented X 3. Moves all extremities spontaneously. No focal deficits noted. Psych:  Responds to questions appropriately with a normal affect. Skin: No rashes or lesions noted  Labs:   Lab Results  Component Value Date   WBC 6.9 10/01/2016   HGB 11.6 (L) 10/01/2016   HCT 36.5 10/01/2016   MCV 89.9 10/01/2016   PLT 255 10/01/2016     Recent Labs Lab 10/01/16 1620  NA 138  K 4.0  CL 112*  CO2 20*  BUN 14  CREATININE 1.21*  CALCIUM 8.6*  PROT 6.4*  BILITOT 0.8  ALKPHOS 115  ALT 39  AST 39  GLUCOSE 69    Recent Labs  10/01/16 1646  TROPIPOC 0.11*   Lab Results  Component Value Date   CHOL 105 09/12/2016   HDL 41 09/12/2016   LDLCALC 49 09/12/2016   TRIG 76 09/12/2016   B Natriuretic Peptide  Date/Time Value Ref Range Status  06/23/2016 03:10 PM 424.4 (H) 0.0 - 100.0 pg/mL Final  11/19/2015 12:25 AM 217.0 (H) 0.0 - 100.0 pg/mL Final  06/25/2014 03:35 PM 309.9 (H) 0.0 - 100.0 pg/mL Final   Pro B Natriuretic peptide (BNP)  Date/Time Value Ref Range Status  04/21/2014 10:16 AM 86.0 0.0 - 100.0  pg/mL Final  04/12/2014 02:23 PM 156.0 (H) 0.0 - 100.0 pg/mL Final   Lab Results  Component Value Date   DDIMER <0.27 06/23/2016    Radiology/Studies: Dg Chest 2 View  Result Date: 10/01/2016 CLINICAL DATA:  71 year old female with heart palpitations for the past 3 days EXAM: CHEST  2 VIEW COMPARISON:  Prior chest x-ray 06/25/2016 FINDINGS: Cardiomegaly with left atrial heart enlargement appears similar compared to prior imaging. Atherosclerotic calcifications again noted in the transverse aorta. Chronic mild vascular congestion without overt edema. Linear scarring again noted in the lingula. No new airspace consolidation, pleural effusion or pneumothorax. IMPRESSION: 1. Stable cardiomegaly with left atrial and left ventricular enlargement. 2. Chronic lingular atelectasis/scarring, unchanged. 3.  Aortic Atherosclerosis (ICD10-170.0) Electronically Signed   By: Jacqulynn Cadet M.D.   On: 10/01/2016 17:37     Cardiac Cath: 06-25-2016: Conclusion   Pulmonary HTN with PA pressure 69/23 mean 38   Trans-pulmonic gradient 30 mmHg  PVR 7.3 Woods Units by Fick (CO 4.15)  PVR 9.6 Woods Units by Thermodilution (CO 3.14)    Echo:  06/24/16: Study Conclusions  - Left ventricle: The cavity size was normal. Wall thickness was   increased in a pattern of severe LVH. Systolic function was   vigorous. The estimated ejection fraction was in the range of 65%   to 70%. Wall motion was normal; there were no regional wall   motion abnormalities. Doppler parameters are consistent with a   reversible restrictive pattern, indicative of decreased left   ventricular diastolic compliance and/or increased left atrial   pressure (grade 3 diastolic dysfunction). - Left atrium: The atrium was severely dilated. - Right atrium: The atrium was mildly dilated. - Tricuspid valve: There was mild-moderate regurgitation. - Pulmonary arteries: Systolic pressure was severely increased. PA   peak pressure: 86 mm Hg  (S).  ECG: Atrial fibrillation heart rate 86 bpm, LVH with marked repolarization abnormality  ASSESSMENT AND PLAN:  Principal Problem:   Paroxysmal atrial fibrillation with rapid ventricular response (HCC) Active Problems:   Persistent atrial fibrillation (HCC)   Sinus bradycardia   OSA (obstructive sleep apnea)   Pulmonary hypertension, primary ([place])   Signed, Sherren Mocha, MD 10/01/2016 6:42 PM Beeper [phone]  Patient seen, examined. Available data reviewed. Agree with findings, assessment, and plan as outlined by Rosaria Ferries, PA-C. I have added data where appropriate. 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   She presents with recurrent symptomatic atrial fibrillation associated with elevated troponin. The patient converted to sinus rhythm with IV diltiazem in the emergency department. She is now feeling better. However, she describes frequent tachycardia palpitations, chest discomfort, weakness, and shortness of breath over the last 3 days and she associates this with elevated and irregular heart rates.  On my exam, she is a very pleasant, obese woman in no distress. Lung fields are clear. JVP is difficult to visualize but appears to be normal, carotid upstrokes are normal without bruits. Heart is regular rate and rhythm with no murmur or gallop. Abdomen is soft obese and nontender. Extremities are without edema.  Recommend admission for observation and continuous telemetry to evaluate her heart rhythm. She will continue on anticoagulation with Xarelto. I have reviewed Dr. Olin Pia note and it appears that further  antiarrhythmic options are extremely limited. She may require repeat ablation. Will ask for an EP consultation in the morning.  Sherren Mocha,  M.D. 10/01/2016 6:42 PM

## 2016-10-01 NOTE — ED Notes (Signed)
Note sent to pharmacy to verify and send patient meds ordered for admission.

## 2016-10-02 ENCOUNTER — Encounter (HOSPITAL_COMMUNITY): Payer: Medicare Other

## 2016-10-02 DIAGNOSIS — G4733 Obstructive sleep apnea (adult) (pediatric): Secondary | ICD-10-CM | POA: Diagnosis not present

## 2016-10-02 DIAGNOSIS — I422 Other hypertrophic cardiomyopathy: Secondary | ICD-10-CM

## 2016-10-02 DIAGNOSIS — I480 Paroxysmal atrial fibrillation: Secondary | ICD-10-CM | POA: Diagnosis not present

## 2016-10-02 DIAGNOSIS — I4892 Unspecified atrial flutter: Secondary | ICD-10-CM | POA: Diagnosis not present

## 2016-10-02 MED ORDER — POTASSIUM CHLORIDE CRYS ER 20 MEQ PO TBCR: 10.0000 meq | EXTENDED_RELEASE_TABLET | Freq: Every day | ORAL | Status: DC

## 2016-10-02 MED ORDER — GLYCOPYRROLATE-FORMOTEROL 9-4.8 MCG/ACT IN AERO: 1.0000 | INHALATION_SPRAY | Freq: Two times a day (BID) | RESPIRATORY_TRACT | Status: DC

## 2016-10-02 MED ORDER — EZETIMIBE 10 MG PO TABS: ORAL_TABLET | ORAL | 3 refills | Status: DC

## 2016-10-02 MED ORDER — ACETAMINOPHEN 325 MG PO TABS: 650.0000 mg | ORAL_TABLET | ORAL | Status: DC | PRN

## 2016-10-02 NOTE — Procedures (Signed)
Per RN: patient refused CPAP for tonight.  RN to call Respiratory if patient changes her mind.

## 2016-10-02 NOTE — Progress Notes (Signed)
Patient's B/P 85/46 this AM. Pulse 55. Patient asymptomatic. Notified on call MD ([place]). Will continue to monitor

## 2016-10-02 NOTE — Care Management Note (Signed)
Case Management Note Marvetta Gibbons RN, BSN Unit 2W-Case Manager [phone]  Patient Details  Name: Amy [name] MRN: [id number] Date of Birth: 23-Apr-1945  Subjective/Objective:  Pt presented with afib- placed in observation                  Action/Plan: PTA pt lived at home- was active with [facility] for HHRN/PT services- per pt also has home 02 with [facility] (baseline 2-2.5L)- will have [facility] services resumed on discharge.   Expected Discharge Date:  10/02/16               Expected Discharge Plan:  [place]  In-House Referral:     Discharge planning Services  CM Consult  Post Acute Care Choice:  Home Health, Resumption of Svcs/PTA Provider Choice offered to:  Patient  DME Arranged:    DME Agency:  [place] Arranged:  RN, PT [facility] Agency:  [place]  Status of Service:  Completed, signed off  If discussed at [place] of Stay Meetings, dates discussed:    Discharge Disposition: home/home health   Additional Comments:  10/02/16- 1420- Marvetta Gibbons RN, CM- notified pt for d/c home today by [facility] RN- call made to Santiago Glad with [facility] to notify of d/c and resume [facility] services as previously ordered.  Dawayne Patricia, RN 10/02/2016, 2:24 PM

## 2016-10-02 NOTE — Consult Note (Signed)
Cardiology Consultation:   Patient ID: Amy [name]; [id number]; 09-17-1945   Admit date: 10/01/2016 Date of Consult: 10/02/2016  Primary Care Provider: Aretta Nip, MD Primary Cardiologist: Radford Pax Primary Electrophysiologist:  Klein/Allred   Patient Profile:   Amy [name] is a 71 y.o. female with a hx of atrial fibrillation who is being seen today for the evaluation of atrial fibrillation at the request of Sherren Mocha.  History of Present Illness:   [name] is a 71 y.o. female with a history of HTN, HLD, diastolic CHF, ASCAD, OSA on BiPAP, and persistent atrial fibrillation. 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 She has had lung toxicity with amiodarone. She has had ablation with PVI and posterior wall isolation. She had multiple atrial flutter circuits at the time of ablation which were unstable for mapping and thus not ablated. She also had a CTI ablation. She presents today with increasing palpitations over the last week.  Monday, she began having palpitations and took an extra lopressor. Her HR was variable and she vomited. HR went up again Tuesday and she took another 1/2 lopressor with improvement in her symptoms. The day of admission, her HR went up again after her initial dose of lopressor. She did not take another dose. A visiting RN came by her house and found her SBP in the 90s with an elevated HR and EMS was called. She received IV Caredizem in the ER and converted to sinus rhythm.  Since being in the hospital, she has had short runs of atrial tachycardia interspersed with sinus rhythm. She feels well today without major complaint.  Past Medical History:  Diagnosis Date  . Apical variant hypertrophic cardiomyopathy ([place])    Diagnosed by echo and ECG 11/13  . Atypical atrial flutter ([place])   . CAD (coronary artery disease)    a. LHC in 10/2011 demonstrated non-obstructive disease and an 80% lesion in a small D1 treated medically.     . CHF (congestive heart failure) ([place])   . Chronic diastolic heart failure ([place])   . CKD (chronic kidney disease), stage III   . COPD (chronic obstructive pulmonary disease) ([place])   . Dizziness    had PT for being off balance - in approx. 2012  . GERD (gastroesophageal reflux disease)   . Hyperlipidemia   . Hypertension   . Hypothyroid   . Lumbar disc disease   . Macular degeneration   . OSA (obstructive sleep apnea)   . Persistent atrial fibrillation ([place])       . Pulmonary hypertension ([place])    a. [place] 05/2013 showed mild PAH with normal PCWP and RA pressure, could be related to OSA and low oxygen saturation.   . Refusal of blood transfusions as patient is Jehovah's Witness   . Sinus bradycardia     Past Surgical History:  Procedure Laterality Date  . CARDIAC CATHETERIZATION     normal coronary arteries  . CARDIAC CATHETERIZATION N/A 10/31/2014   Procedure: Left Heart Cath and Coronary Angiography;  Surgeon: Leonie Man, MD;  Location: [facility];  Service: Cardiovascular;  Laterality: N/A;  . CATARACT EXTRACTION Bilateral   . CESAREAN SECTION  1983  . ELECTROPHYSIOLOGIC STUDY N/A 04/24/2015   Procedure: Atrial Fibrillation Ablation;  Surgeon: [name], MD;  Location: [facility];  Service: Cardiovascular;  Laterality: N/A;  . RIGHT HEART CATH N/A 06/25/2016   Procedure: Right Heart Cath;  Surgeon: Sherren Mocha, MD;  Location: [facility];  Service: Cardiovascular;  Laterality: N/A;  . RIGHT HEART CATHETERIZATION N/A 06/01/2013   Procedure: RIGHT HEART CATH;  Surgeon: Larey Dresser, MD;  Location: [facility] CATH LAB;  Service: Cardiovascular;  Laterality: N/A;  . TEE WITHOUT CARDIOVERSION N/A 04/23/2015   Procedure: TRANSESOPHAGEAL ECHOCARDIOGRAM (TEE);  Surgeon: Josue Hector, MD;  Location: [facility] ENDOSCOPY;  Service: Cardiovascular;  Laterality: N/A;     Inpatient Medications: Scheduled Meds: . atorvastatin  80 mg Oral Q lunch  . cholecalciferol   2,000 Units Oral Daily  . colchicine  0.6 mg Oral Daily  . diltiazem  10 mg Intravenous Once  . dronedarone  400 mg Oral BID WC  . ezetimibe  10 mg Oral QPM  . furosemide  20 mg Oral Daily  . levothyroxine  100 mcg Oral QAC breakfast  . Macitentan  10 mg Oral Daily  . metoprolol tartrate  12.5 mg Oral BID  . multivitamin with minerals  1 tablet Oral Daily  . pantoprazole  40 mg Oral QHS  . potassium chloride SA  10 mEq Oral QHS  . Rivaroxaban  15 mg Oral Q supper  . sodium chloride flush  3 mL Intravenous Q12H  . tadalafil (PAH)  20 mg Oral Daily   Continuous Infusions: . sodium chloride    . diltiazem (CARDIZEM) infusion Stopped (10/01/16 1745)   PRN Meds: sodium chloride, acetaminophen, ALPRAZolam, cyclobenzaprine, nitroGLYCERIN, ondansetron (ZOFRAN) IV, sodium chloride flush, zolpidem  Allergies:    Allergies  Allergen Reactions  . Amiodarone     Dyspnea - felt to be 2/2 amio lung toxicity Has tolerated Omnipaque   . Zyban [Bupropion] Itching    Social History:   Social History   Social History  . Marital status: Widowed    Spouse name: divorced.  . Number of children: 1  . Years of education: N/A   Occupational History  . retired. still works for [employer].     Social History Main Topics  . Smoking status: Former Smoker    Packs/day: 0.50    Years: 44.00    Types: Cigarettes    Quit date: 04/07/2009  . Smokeless tobacco: Never Used  . Alcohol use No  . Drug use: No  . Sexual activity: No   Other Topics Concern  . Not on file   Social History Narrative   Pt lives alone with 2 dogs.     Family History:   The patient's family history includes Breast cancer in her paternal aunt; Gout in her father; Heart disease in her sister; Lung cancer in her father.  ROS:  Please see the history of present illness.  ROS  All other ROS reviewed and negative.     Physical Exam/Data:   Vitals:   10/01/16 2217 10/01/16 2230 10/01/16 2312 10/02/16 0412  BP: (!) 97/56  (!) 118/97 (!) 107/59 (!) 87/46  Pulse: 62  (!) 55 60  Resp: _0 Temp:   97.5 F (36.4 C) 97.6 F (36.4 C)  TempSrc:   Oral Oral  SpO2: (!) 88%  95% 92%  Weight:   198 lb 9.6 oz (90.1 kg) 198 lb (89.8 kg)  Height:   _1  (1.6 m)     Intake/Output Summary (Last 24 hours) at 10/02/16 0750 Last data filed at 10/02/16 0324  Gross per 24 hour  Intake           506.25 ml  Output  301 ml  Net           205.25 ml   Filed Weights   10/01/16 1510 10/01/16 2312 10/02/16 0412  Weight: 190 lb (86.2 kg) 198 lb 9.6 oz (90.1 kg) 198 lb (89.8 kg)   Body mass index is 35.07 kg/m.  General:  Well nourished, well developed, in no acute distress HEENT: normal Lymph: no adenopathy Neck: no JVD Endocrine:  No thryomegaly Vascular: No carotid bruits; FA pulses 2+ bilaterally without bruits  Cardiac:  normal S1, S2; RRR; no murmur  Lungs:  clear to auscultation bilaterally, no wheezing, rhonchi or rales  Abd: soft, nontender, no hepatomegaly  Ext: no edema Musculoskeletal:  No deformities, BUE and BLE strength normal and equal Skin: warm and dry  Neuro:  CNs 2-12 intact, no focal abnormalities noted Psych:  Normal affect   EKG:  The EKG was personally reviewed and demonstrates:  Atrial flutter Telemetry:  Telemetry was personally reviewed and demonstrates:  Atrial flutter/sinus rhythm/atrial tachycardia  Relevant CV Studies: TTE 06/24/16 - Left ventricle: The cavity size was normal. Wall thickness was   increased in a pattern of severe LVH. Systolic function was   vigorous. The estimated ejection fraction was in the range of 65%   to 70%. Wall motion was normal; there were no regional wall   motion abnormalities. Doppler parameters are consistent with a   reversible restrictive pattern, indicative of decreased left   ventricular diastolic compliance and/or increased left atrial   pressure (grade 3 diastolic dysfunction). - Left atrium: The atrium was severely  dilated. - Right atrium: The atrium was mildly dilated. - Tricuspid valve: There was mild-moderate regurgitation. - Pulmonary arteries: Systolic pressure was severely increased. PA   peak pressure: 86 mm Hg (S).  Laboratory Data:  Chemistry Recent Labs Lab 10/01/16 1620  NA 138  K 4.0  CL 112*  CO2 20*  GLUCOSE 69  BUN 14  CREATININE 1.21*  CALCIUM 8.6*  GFRNONAA 44*  GFRAA 51*  ANIONGAP 6     Recent Labs Lab 10/01/16 1620  PROT 6.4*  ALBUMIN 3.4*  AST 39  ALT 39  ALKPHOS 115  BILITOT 0.8   Hematology Recent Labs Lab 10/01/16 1620  WBC 6.9  RBC 4.06  HGB 11.6*  HCT 36.5  MCV 89.9  MCH 28.6  MCHC 31.8  RDW 17.4*  PLT 255   Cardiac EnzymesNo results for input(s): TROPONINI in the last 168 hours.  Recent Labs Lab 10/01/16 1646  TROPIPOC 0.11*    BNPNo results for input(s): BNP, PROBNP in the last 168 hours.  DDimer No results for input(s): DDIMER in the last 168 hours.  Radiology/Studies:  Dg Chest 2 View  Result Date: 10/01/2016 CLINICAL DATA:  71 year old female with heart palpitations for the past 3 days EXAM: CHEST  2 VIEW COMPARISON:  Prior chest x-ray 06/25/2016 FINDINGS: Cardiomegaly with left atrial heart enlargement appears similar compared to prior imaging. Atherosclerotic calcifications again noted in the transverse aorta. Chronic mild vascular congestion without overt edema. Linear scarring again noted in the lingula. No new airspace consolidation, pleural effusion or pneumothorax. IMPRESSION: 1. Stable cardiomegaly with left atrial and left ventricular enlargement. 2. Chronic lingular atelectasis/scarring, unchanged. 3.  Aortic Atherosclerosis (ICD10-170.0) Electronically Signed   By: Jacqulynn Cadet M.D.   On: 10/01/2016 17:37    Assessment and Plan:   1. Atrial fibrillation/atypical atrial flutter: Has had multiple episodes in the past with symptoms of palpitations and chest pain. She has had ablation  with multiple flutter circuits seen.  Has had amiodarone toxicity and is now on multaq. Unfortunately, her medication options are limited as per Dr. Olin Pia note. Her QTc has been prolonged in the past, up to 480 msec and thus Tikosyn or sotalol would be poor options. At this point, she may require another ablation for her AF and atypical flutters. She is currently on a low dose of metoprolol, would increase it to 25 mg BID. If this does not help with her symptoms, could add diltiazem as this converted her in the ER. She needs close follow up in clinic with Dr. Rayann Heman.  2.   OSA: have stressed importance of BiPAP compliance  3.    Hypertrophic cardiomyopathy: currently no signs of volume overload, would continue current medications  Signed, Will Meredith Leeds, MD  10/02/2016 7:50 AM

## 2016-10-02 NOTE — Care Management Obs Status (Signed)
[facility] NOTIFICATION   Patient Details  Name: ALANAH SAKUMA MRN: [id number] Date of Birth: 06-25-1945   Medicare Observation Status Notification Given:  Yes    Dawayne Patricia, RN 10/02/2016, 2:00 PM

## 2016-10-02 NOTE — Progress Notes (Signed)
[facility]  Patient Status: Active (receiving services up to time of hospitalization)  [facility] is providing the following services: RN and PT  If patient discharges after hours, please call [phone].   Edwinna Areola 10/02/2016, 12:22 PM

## 2016-10-02 NOTE — Discharge Summary (Signed)
Patient ID: Amy [name],  MRN: [id number], DOB/AGE: 11-28-1945 71 y.o.  Admit date: 10/01/2016 Discharge date: 10/02/2016  Primary Care Provider: Aretta Nip, MD Primary Cardiologist: Dr Radford Pax CHF: Dr Aundra Dubin EP: Dr Allred  Discharge Diagnoses Principal Problem:   Atrial flutter with RVR Active Problems:   Essential hypertension   Chronic diastolic heart failure (HCC)   Persistent atrial fibrillation (H[facility])   Chronic anticoagulation   Chronic renal insufficiency, stage III (moderate)   Pulmonary hypertension, primary (HCC)   Hypothyroidism   Apical variant hypertrophic cardiomyopathy ([facility])   OSA (obstructive sleep apnea)   Dyslipidemia   Morbid obesity due to excess calories (H[facility])   CAD (coronary artery disease)    Hospital Course:  71 y.o. female with a history of HTN, HL, diastolic CHF, non obstructive ASCAD (80% D1) 2013 onmedical management, OSA on BiPAP,and PAF. 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 She also has apical HCOM by cardiac MRI in 2013 and moderate pulmonary HTN. She is followed by Dr Aundra Dubin in the CHF clinic- on Opsumit and [place].  She has symptomatic AF. She has had prior RFA Jan 2017 and had been taken off Amiodarone in the past secondary to pulmonary toxicity. She is on Multaq but has had breakthrough PAF.  She presented to the ED 10/01/16 with intermittent tachycardia for the previous 2-3 days. She was in AF on arrival to the ED with a soft B/P.  She received an IVF bolus and then received IV Diltiazem and converted to NSR. She was admitted overnight for further evaluation. She was seen by Dr Curt Bears for EP. She is not felt to be a candidate for Tikosyn or Sotalol secondary to prolonged QTc. He suggested increasing her beta blocker but her B/P is low.  Repeat RFA may need to be considered.The pt was seen by Dr Burt Knack on 6/28 and felt she could be discharged. The pt was frustrated about her condition  and tearful. Her B/P is running low and Dr Burt Knack Care did not think she could tolerate a higher dose of beta blocker or the addition of Alcian channel blocker because of hypotension. A Care Manager consult was obtained to see if home health care visits could be arranged. She'll need an early f/u with Dr Rayann Heman and with Dr Aundra Dubin.    Discharge Vitals:  Blood pressure (!) 87/46, pulse 60, temperature 97.6 F (36.4 C), temperature source Oral, resp. rate 18, height _0  (1.6 m), weight 198 lb (89.8 kg), SpO2 92 %.    Labs: Results for orders placed or performed during the hospital encounter of 10/01/16 (from the past 24 hour(s))  Urinalysis, Routine w reflex microscopic     Status: Abnormal   Collection Time: 10/01/16  3:37 PM  Result Value Ref Range   Color, Urine STRAW (A) YELLOW   APPearance CLEAR CLEAR   Specific Gravity, Urine 1.005 1.005 - 1.030   pH 5.0 5.0 - 8.0   Glucose, UA NEGATIVE NEGATIVE mg/dL   Hgb urine dipstick NEGATIVE NEGATIVE   Bilirubin Urine NEGATIVE NEGATIVE   Ketones, ur NEGATIVE NEGATIVE mg/dL   Protein, ur NEGATIVE NEGATIVE mg/dL   Nitrite NEGATIVE NEGATIVE   Leukocytes, UA NEGATIVE NEGATIVE  CBC     Status: Abnormal   Collection Time: 10/01/16  4:20 PM  Result Value Ref Range   WBC 6.9 4.0 - 10.5 K/uL   RBC 4.06 3.87 - 5.11 MIL/uL   Hemoglobin 11.6 (L) 12.0 -  15.0 g/dL   HCT 36.5 36.0 - 46.0 %   MCV 89.9 78.0 - 100.0 fL   MCH 28.6 26.0 - 34.0 pg   MCHC 31.8 30.0 - 36.0 g/dL   RDW 17.4 (H) 11.5 - 15.5 %   Platelets 255 150 - 400 K/uL  Comprehensive metabolic panel     Status: Abnormal   Collection Time: 10/01/16  4:20 PM  Result Value Ref Range   Sodium 138 135 - 145 mmol/L   Potassium 4.0 3.5 - 5.1 mmol/L   Chloride 112 (H) 101 - 111 mmol/L   CO2 20 (L) 22 - 32 mmol/L   Glucose, Bld 69 65 - 99 mg/dL   BUN 14 6 - 20 mg/dL   Creatinine, Ser 1.21 (H) 0.44 - 1.00 mg/dL   Calcium 8.6 (L) 8.9 - 10.3 mg/dL   Total Protein 6.4 (L) 6.5 - 8.1 g/dL    Albumin 3.4 (L) 3.5 - 5.0 g/dL   AST 39 15 - 41 U/L   ALT 39 14 - 54 U/L   Alkaline Phosphatase 115 38 - 126 U/L   Total Bilirubin 0.8 0.3 - 1.2 mg/dL   GFR calc non Af Amer 44 (L) >60 mL/min   GFR calc Af Amer 51 (L) >60 mL/min   Anion gap 6 5 - 15  I-stat troponin, ED     Status: Abnormal   Collection Time: 10/01/16  4:46 PM  Result Value Ref Range   Troponin i, poc 0.11 (HH) 0.00 - 0.08 ng/mL   Comment NOTIFIED PHYSICIAN    Comment 3            Disposition:  Follow-up Information    [name], MD Follow up on 10/13/2016.   Specialty:  Cardiology Why:  8:30 am Contact information: [address] [place] [address] [phone]        Larey Dresser, MD Follow up.   Specialty:  Cardiology Why:  office will contact you Contact information: [address] [place] [address] [phone]           Discharge Medications:  Allergies as of 10/02/2016      Reactions   Amiodarone    Dyspnea - felt to be 2/2 amio lung toxicity Has tolerated Omnipaque    Zyban [bupropion] Itching      Medication List    STOP taking these medications   tadalafil (PAH) 20 MG tablet Commonly known as:  ADCIRCA     TAKE these medications   acetaminophen 325 MG tablet Commonly known as:  TYLENOL Take 2 tablets (650 mg total) by mouth every 4 (four) hours as needed for headache or mild pain.   atorvastatin 80 MG tablet Commonly known as:  LIPITOR Take 1 tablet (80 mg total) by mouth daily with lunch.   colchicine 0.6 MG tablet Take 0.6 mg by mouth daily.   cyclobenzaprine 5 MG tablet Commonly known as:  FLEXERIL Take 1 tablet (5 mg total) by mouth 3 (three) times daily as needed for muscle spasms.   ezetimibe 10 MG tablet Commonly known as:  ZETIA TAKE 1 TABLET (10 MG TOTAL) BY MOUTH IN THE EVENING   furosemide 20 MG tablet Commonly known as:  LASIX Take 1 tablet (20 mg total) by mouth daily.   Glycopyrrolate-Formoterol 9-4.8  MCG/ACT Aero Commonly known as:  BEVESPI AEROSPHERE Inhale 1 puff into the lungs 2 (two) times daily.   levothyroxine 100 MCG tablet Commonly known as:  SYNTHROID, LEVOTHROID Take 100 mcg by mouth daily before breakfast.   Macitentan 10 MG Tabs Commonly known as:  OPSUMIT Take 1 tablet (10 mg total) by mouth daily.   metoprolol tartrate 25 MG tablet Commonly known as:  LOPRESSOR TAKE 0.5 TABLETS (12.5 MG TOTAL) BY MOUTH 2 (TWO) TIMES DAILY.   MULTAQ 400 MG tablet Generic drug:  dronedarone TAKE 1 TABLET BY MOUTH TWICE A DAY WITH A MEAL   multivitamin capsule Take 1 capsule by mouth daily.   NITROSTAT 0.4 MG SL tablet Generic drug:  nitroGLYCERIN PLACE 1 TABLET (0.4 MG TOTAL) UNDER THE TONGUE EVERY 5 (FIVE) MINUTES AS NEEDED FOR CHEST PAIN.   pantoprazole 40 MG tablet Commonly known as:  PROTONIX TAKE 1 TABLET (40 MG TOTAL) BY MOUTH DAILY.   potassium chloride SA 20 MEQ tablet Commonly known as:  K-DUR,KLOR-CON Take 0.5 tablets (10 mEq total) by mouth at bedtime.   Rivaroxaban 15 MG Tabs tablet Commonly known as:  XARELTO Take 1 tablet (15 mg total) by mouth daily with supper.   UNABLE TO FIND Med Name: CPAP  DME-[facility]   Vitamin D 2000 units Caps Take 2,000 Units by mouth daily.        Duration of Discharge Encounter: Greater than 30 minutes including physician time.  Angelena Form PA-C 10/02/2016 2:12 PM

## 2016-10-02 NOTE — Progress Notes (Signed)
Progress Note  Patient Name: Amy [name] Date of Encounter: 10/02/2016  Primary Cardiologist: McLean/Turner/Allred  Subjective   The patient is tearful. She is frustrated about her quality of life. She has no acute chest pain or shortness of breath. She's had no palpitations since admission.  Inpatient Medications    Scheduled Meds: . atorvastatin  80 mg Oral Q lunch  . cholecalciferol  2,000 Units Oral Daily  . colchicine  0.6 mg Oral Daily  . diltiazem  10 mg Intravenous Once  . dronedarone  400 mg Oral BID WC  . ezetimibe  10 mg Oral QPM  . furosemide  20 mg Oral Daily  . levothyroxine  100 mcg Oral QAC breakfast  . Macitentan  10 mg Oral Daily  . metoprolol tartrate  12.5 mg Oral BID  . multivitamin with minerals  1 tablet Oral Daily  . pantoprazole  40 mg Oral QHS  . potassium chloride SA  10 mEq Oral QHS  . Rivaroxaban  15 mg Oral Q supper  . sodium chloride flush  3 mL Intravenous Q12H  . tadalafil (PAH)  20 mg Oral Daily   Continuous Infusions: . sodium chloride    . diltiazem (CARDIZEM) infusion Stopped (10/01/16 1745)   PRN Meds: sodium chloride, acetaminophen, ALPRAZolam, cyclobenzaprine, nitroGLYCERIN, ondansetron (ZOFRAN) IV, sodium chloride flush, zolpidem   Vital Signs    Vitals:   10/01/16 2217 10/01/16 2230 10/01/16 2312 10/02/16 0412  BP: (!) 97/56 (!) 118/97 (!) 107/59 (!) 87/46  Pulse: 62  (!) 55 60  Resp: _0 Temp:   97.5 F (36.4 C) 97.6 F (36.4 C)  TempSrc:   Oral Oral  SpO2: (!) 88%  95% 92%  Weight:   198 lb 9.6 oz (90.1 kg) 198 lb (89.8 kg)  Height:   _1  (1.6 m)     Intake/Output Summary (Last 24 hours) at 10/02/16 1242 Last data filed at 10/02/16 0324  Gross per 24 hour  Intake           506.25 ml  Output              301 ml  Net           205.25 ml   Filed Weights   10/01/16 1510 10/01/16 2312 10/02/16 0412  Weight: 190 lb (86.2 kg) 198 lb 9.6 oz (90.1 kg) 198 lb (89.8 kg)    Telemetry    Normal  sinus rhythm - Personally Reviewed  Physical Exam  Pleasant, obese woman, on oxygen GEN: No acute distress.   Neck: No JVD Cardiac: RRR, loud P2  Respiratory: Clear to auscultation bilaterally. GI: Soft, nontender, non-distended  MS: No edema; No deformity. Neuro:  Nonfocal  Psych: Normal affect   Labs    Chemistry Recent Labs Lab 10/01/16 1620  NA 138  K 4.0  CL 112*  CO2 20*  GLUCOSE 69  BUN 14  CREATININE 1.21*  CALCIUM 8.6*  PROT 6.4*  ALBUMIN 3.4*  AST 39  ALT 39  ALKPHOS 115  BILITOT 0.8  GFRNONAA 44*  GFRAA 51*  ANIONGAP 6     Hematology Recent Labs Lab 10/01/16 1620  WBC 6.9  RBC 4.06  HGB 11.6*  HCT 36.5  MCV 89.9  MCH 28.6  MCHC 31.8  RDW 17.4*  PLT 255    Cardiac EnzymesNo results for input(s): TROPONINI in the last 168 hours.  Recent Labs Lab 10/01/16 1646  TROPIPOC 0.11*  BNPNo results for input(s): BNP, PROBNP in the last 168 hours.   DDimer No results for input(s): DDIMER in the last 168 hours.   Radiology    Dg Chest 2 View  Result Date: 10/01/2016 CLINICAL DATA:  71 year old female with heart palpitations for the past 3 days EXAM: CHEST  2 VIEW COMPARISON:  Prior chest x-ray 06/25/2016 FINDINGS: Cardiomegaly with left atrial heart enlargement appears similar compared to prior imaging. Atherosclerotic calcifications again noted in the transverse aorta. Chronic mild vascular congestion without overt edema. Linear scarring again noted in the lingula. No new airspace consolidation, pleural effusion or pneumothorax. IMPRESSION: 1. Stable cardiomegaly with left atrial and left ventricular enlargement. 2. Chronic lingular atelectasis/scarring, unchanged. 3.  Aortic Atherosclerosis (ICD10-170.0) Electronically Signed   By: Jacqulynn Cadet M.D.   On: 10/01/2016 17:37     Patient Profile     71 y.o. female with pulmonary hypertension, apical hypertrophic cardiomyopathy, and paroxysmal atrial fibrillation. Here with symptomatic  atrial fibrillation.  Assessment & Plan    1. Paroxysmal atrial fibrillation: Patient is anticoagulated with Xarelto. EP consultation from this morning is reviewed. No antiarrhythmic options at this point. She will continue on Multaq. Will arrange outpatient follow-up with Dr. Rayann Heman do discuss redo atrial fib ablation. Continue anticoagulation with Xarelto. I don't think she can tolerate a higher dose of beta blocker or addition of Alcian channel blocker because of hypotension.  2. Pulmonary hypertension: Followed by Dr. Aundra Dubin. Notes systemic hypotension since starting Adcirca and Opsumit. On home O2.   3. Apical hypertrophic cardiomyopathy: Appears stable without evidence of volume overload.  Disposition: The patient is struggling with her progressive cardiopulmonary limitation, home O2 dependence, ambulation with a walker, and difficulty maintaining her independence. Will consult social work to see if there is any other support we can offer for her. At discharge she will need follow-up in the advanced heart failure clinic as well as the EP clinic.  Deatra James, MD  10/02/2016, 12:42 PM

## 2016-10-06 ENCOUNTER — Other Ambulatory Visit: Payer: Self-pay

## 2016-10-06 NOTE — Patient Outreach (Addendum)
[facility]) Care Management  10/06/2016  Amy [name] 1945/06/06 [id number]    Telephone Screen  Referral Date: 10/03/16 Referral Source: MD Referral Referral Reason: "HF, COPD, HTN, social worker to help with options for independent living and assisted living" Insurance: Medicare   Outreach attempt # 1 to patient. Spoke with patient and screening completed.   Social: Patient resides in her home alone. She stets she recently moved out a two story home with her dtr to a single family home alone. She voices that her children are concerned about her continuing to stay alone. Patient reports she is independent with ADLs/IADLS except it "takes a while" for her to complete tasks ans she has to take frequent breaks and rest periods. She reports that she "can drive but doesn't drive." She relies on her dtr to take her to appts. She denies any recent falls in the home. DME in the home include rollator, transport w/c, oxygen, CPAP, BP monitor and shower chair. She is currently getting [facility]  RN and PT once a week.    Conditions: Patient has PMH of sleep apnea, HTN, A-fib, HLD, CHF, macular degeneration, CAD and pulmonary HTN. She reports that she is having issues with her "oxygen level and BP dropping and being too low." Patient voices that BP has been running in the low 90s to 100s. She is symptomatic and complains of feeling dizzy and lightheaded" when this occurs. She reports that she uses oxygen continuously at 2l/min via [place]. However, patient states that even with oxygen on her sats drop down to the 70s and she is symptomatic. Pulse ox during call today at rest with oxygen was 90%. Patient reports decreased energy level. She voices needing assistance managing her medical conditions. Patient states she is waiting to hear back if she is eligible or not to start pulmonary rehab.She also states that she feels like she "won't be around to finish the year." She is fearful of dying  although she voices she has already made her funeral and final arrangements so her dtrs won't have to do it. Patient voices she is "getting depressed." She has not spoken with MD regarding her feelings. Depressions screen completed and patient agreeable to SW involvement to assist. Patient was admitted as observation to hospital on 10/01/16 to 10/02/16 for A-fib and anemia.    Medications: Patient reports taking 15 meds. She denies any issues affording meds. She is managing her meds and filling med planner on her own. She voices that she would like to "stop taking" any meds that are not necessary for her to be taking.    Appointments: She saw PCP last in March. She is also followed by cardiologists Dr. Aundra Dubin and Dr. Rayann Heman. She has appt on 10/13/16.   Advance Directives: Patient has [place] POA on file. 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   Consent: [facility] services reviewed and discussed. Patient gave verbal consent for services.   Plan: RN CM will notify [facility] administrative assistant of case status. RN CM will send referral to [facility] RN for further in home eval/assessment of care needs and management of chronic conditions. RN CM will send [facility] SW referral for possible assistance with  resources related to ALF placement and depression support/counseling.  RN CM will send [facility] pharmacy referral for polypharmacy med review.   Enzo Montgomery, RN,BSN,CCM [place] [facility] Telephonic Care Management Coordinator Direct Phone: [phone] Toll Free: [phone]  Fax: [phone]

## 2016-10-07 ENCOUNTER — Encounter: Payer: Self-pay | Admitting: *Deleted

## 2016-10-08 DIAGNOSIS — I5032 Chronic diastolic (congestive) heart failure: Secondary | ICD-10-CM | POA: Diagnosis not present

## 2016-10-08 DIAGNOSIS — N183 Chronic kidney disease, stage 3 (moderate): Secondary | ICD-10-CM | POA: Diagnosis not present

## 2016-10-08 DIAGNOSIS — I2510 Atherosclerotic heart disease of native coronary artery without angina pectoris: Secondary | ICD-10-CM | POA: Diagnosis not present

## 2016-10-08 DIAGNOSIS — I480 Paroxysmal atrial fibrillation: Secondary | ICD-10-CM | POA: Diagnosis not present

## 2016-10-08 DIAGNOSIS — I422 Other hypertrophic cardiomyopathy: Secondary | ICD-10-CM | POA: Diagnosis not present

## 2016-10-08 DIAGNOSIS — I272 Pulmonary hypertension, unspecified: Secondary | ICD-10-CM | POA: Diagnosis not present

## 2016-10-10 ENCOUNTER — Ambulatory Visit: Payer: Medicare Other | Admitting: *Deleted

## 2016-10-10 ENCOUNTER — Other Ambulatory Visit: Payer: Self-pay | Admitting: *Deleted

## 2016-10-10 ENCOUNTER — Encounter: Payer: Self-pay | Admitting: Pharmacist

## 2016-10-10 NOTE — Patient Outreach (Signed)
[facility]) Care Management  10/10/2016  Amy [name] 03-Oct-1945 [id number]   CSW made an initial attempt to try and contact patient today to perform phone assessment, as well as assess and assist with social needs and services, without success.  A HIPAA compliant message was left for patient on voicemail.  CSW is currently awaiting a return call. CSW will make a second outreach attempt within the next week, if CSW does not receive a return call from patient in the meantime. Nat Christen, BSW, MSW, LCSW  Licensed Education officer, environmental Health System  Mailing [place] [address], [place], [place] [address] Physical Address-[address], [place], [place] [address] Toll Free Main # [phone] Fax # [phone] Cell # [phone]  Office # [phone] [email or web address]

## 2016-10-13 ENCOUNTER — Encounter: Payer: Self-pay | Admitting: Internal Medicine

## 2016-10-13 ENCOUNTER — Ambulatory Visit (INDEPENDENT_AMBULATORY_CARE_PROVIDER_SITE_OTHER): Payer: Medicare Other | Admitting: Internal Medicine

## 2016-10-13 ENCOUNTER — Other Ambulatory Visit: Payer: Self-pay | Admitting: *Deleted

## 2016-10-13 ENCOUNTER — Telehealth: Payer: Self-pay | Admitting: Pharmacist

## 2016-10-13 ENCOUNTER — Encounter: Payer: Self-pay | Admitting: *Deleted

## 2016-10-13 ENCOUNTER — Encounter: Payer: Self-pay | Admitting: Pharmacist

## 2016-10-13 VITALS — BP 108/68 | HR 54 | Ht 63.0 in | Wt 190.8 lb

## 2016-10-13 DIAGNOSIS — I480 Paroxysmal atrial fibrillation: Secondary | ICD-10-CM | POA: Diagnosis not present

## 2016-10-13 DIAGNOSIS — I481 Persistent atrial fibrillation: Secondary | ICD-10-CM

## 2016-10-13 DIAGNOSIS — I2510 Atherosclerotic heart disease of native coronary artery without angina pectoris: Secondary | ICD-10-CM

## 2016-10-13 DIAGNOSIS — I4819 Other persistent atrial fibrillation: Secondary | ICD-10-CM

## 2016-10-13 NOTE — Progress Notes (Signed)
PCP: Aretta Nip, MD Primary Cardiologist: Dr Radford Pax CHF: Amy [name] EP: Amy [name] is a 71 y.o. female who presents today for electrophysiology followup.  She has occasional afib.  Overall, her afib burden is suprisingly low.  She remains on multaq which she is tolerating.  She continues to require O2 and management for pulmonary hypertension.  Today, she denies symptoms of palpitations, chest pain,  lower extremity edema, dizziness, presyncope, or syncope.  The patient is otherwise without complaint today.   Past Medical History:  Diagnosis Date  . Apical variant hypertrophic cardiomyopathy ([place])    Diagnosed by echo and ECG 11/13  . Atypical atrial flutter ([place])   . CAD (coronary artery disease)    a. LHC in 10/2011 demonstrated non-obstructive disease and an 80% lesion in a small D1 treated medically.   . CHF (congestive heart failure) ([place])   . Chronic diastolic heart failure ([place])   . CKD (chronic kidney disease), stage III   . COPD (chronic obstructive pulmonary disease) ([place])   . Dizziness    had PT for being off balance - in approx. 2012  . GERD (gastroesophageal reflux disease)   . Hyperlipidemia   . Hypertension   . Hypothyroid   . Lumbar disc disease   . Macular degeneration   . OSA (obstructive sleep apnea)   . Persistent atrial fibrillation ([place])       . Pulmonary hypertension ([place])    a. [place] 05/2013 showed mild PAH with normal PCWP and RA pressure, could be related to OSA and low oxygen saturation.   . Refusal of blood transfusions as patient is Jehovah's Witness   . Sinus bradycardia    Past Surgical History:  Procedure Laterality Date  . CARDIAC CATHETERIZATION     normal coronary arteries  . CARDIAC CATHETERIZATION N/A 10/31/2014   Procedure: Left Heart Cath and Coronary Angiography;  Surgeon: Leonie Man, MD;  Location: [place] CV LAB;  Service: Cardiovascular;  Laterality: N/A;  . CATARACT EXTRACTION Bilateral   . CESAREAN  SECTION  1983  . ELECTROPHYSIOLOGIC STUDY N/A 04/24/2015   Procedure: Atrial Fibrillation Ablation;  Surgeon: [name], MD;  Location: [place] CV LAB;  Service: Cardiovascular;  Laterality: N/A;  . RIGHT HEART CATH N/A 06/25/2016   Procedure: Right Heart Cath;  Surgeon: Sherren Mocha, MD;  Location: [place] CV LAB;  Service: Cardiovascular;  Laterality: N/A;  . RIGHT HEART CATHETERIZATION N/A 06/01/2013   Procedure: RIGHT HEART CATH;  Surgeon: Larey Dresser, MD;  Location[facility] C[facility] LAB;  Service: Cardiovascular;  Laterality: N/A;  . TEE WITHOUT CARDIOVERSION N/A 04/23/2015   Procedure: TRANSESOPHAGEAL ECHOCARDIOGRAM (TEE);  Surgeon: Josue Hector, MD;  Location: [facility] ENDOSCOPY;  Service: Cardiovascular;  Laterality: N/A;    ROS- all systems are reviewed and negatives except as per HPI above  Current Outpatient Prescriptions  Medication Sig Dispense Refill  . acetaminophen (TYLENOL) 325 MG tablet Take 2 tablets (650 mg total) by mouth every 4 (four) hours as needed for headache or mild pain.    Marland Kitchen atorvastatin (LIPITOR) 80 MG tablet Take 1 tablet (80 mg total) by mouth daily with lunch. 90 tablet 3  . Cholecalciferol (VITAMIN D) 2000 UNITS CAPS Take 2,000 Units by mouth daily.     . colchicine 0.6 MG tablet Take 0.6 mg by mouth daily.     . cyclobenzaprine (FLEXERIL) 5 MG tablet Take 1 tablet (5 mg total) by mouth 3 (three) times daily as needed  for muscle spasms. 10 tablet 0  . ezetimibe (ZETIA) 10 MG tablet TAKE 1 TABLET (10 MG TOTAL) BY MOUTH IN THE EVENING 90 tablet 3  . furosemide (LASIX) 20 MG tablet Take 1 tablet (20 mg total) by mouth daily. 30 tablet 6  . Glycopyrrolate-Formoterol (BEVESPI AEROSPHERE) 9-4.8 MCG/ACT AERO Inhale 1 puff into the lungs 2 (two) times daily.    Marland Kitchen levothyroxine (SYNTHROID, LEVOTHROID) 100 MCG tablet Take 100 mcg by mouth daily before breakfast.    . Macitentan (OPSUMIT) 10 MG TABS Take 1 tablet (10 mg total) by mouth daily. 30 tablet 11  .  metoprolol tartrate (LOPRESSOR) 25 MG tablet TAKE 0.5 TABLETS (12.5 MG TOTAL) BY MOUTH 2 (TWO) TIMES DAILY. 90 tablet 1  . MULTAQ 400 MG tablet TAKE 1 TABLET BY MOUTH TWICE A DAY WITH A MEAL 180 tablet 3  . Multiple Vitamin (MULTIVITAMIN) capsule Take 1 capsule by mouth daily.    Marland Kitchen NITROSTAT 0.4 MG SL tablet PLACE 1 TABLET (0.4 MG TOTAL) UNDER THE TONGUE EVERY 5 (FIVE) MINUTES AS NEEDED FOR CHEST PAIN. 25 tablet 4  . pantoprazole (PROTONIX) 40 MG tablet TAKE 1 TABLET (40 MG TOTAL) BY MOUTH DAILY. 90 tablet 2  . potassium chloride SA (K-DUR,KLOR-CON) 20 MEQ tablet Take 0.5 tablets (10 mEq total) by mouth at bedtime.    . Rivaroxaban (XARELTO) 15 MG TABS tablet Take 1 tablet (15 mg total) by mouth daily with supper. 30 tablet 11  . UNABLE TO FIND Med Name: CPAP  DME-[facility]     No current facility-administered medications for this visit.     Physical Exam: Vitals:   10/13/16 0846  BP: 108/68  Pulse: (!) 54  SpO2: 92%  Weight: 190 lb 12.8 oz (86.5 kg)  Height: _0  (1.6 m)    GEN- The patient is well appearing, alert and oriented x 3 today.   Head- normocephalic, atraumatic Eyes-  Sclera clear, conjunctiva pink Ears- hearing intact Oropharynx- clear Lungs- Clear to ausculation bilaterally, normal work of breathing Heart- Regular rate and rhythm  GI- soft, NT, ND, + BS Extremities- no clubbing, cyanosis, or edema  EKG tracing ordered today is personally reviewed and shows sinus rhthm, LVH, diffuse TWI  Assessment and Plan:  1. Persistent afib/ atypical atrial flutter She has done much better than expected given severe LA enlargement and pulmonary hypertension.  She is tolerating multaq.  Does not have other AAD options.  Would not advise additional ablation (multiple atypical atrial flutter cycle lengths not amenable to ablation noted at last EPS and severe LA enlargement with pulmonary hypertension).  She is also clear that she is not interested in additional ablations.  She should  continue her current medical therapy and take additional metoprolol prn afib with RVR. Given her relatively low afib burden, I think that this approach should be good for now.  Could consider PPM with AV nodal ablation if her afib burden increases substantially down the road. On xarelto Further EP management per Dr Caryl Comes going forward  2. Overweight Body mass index is 33.8 kg/m. Little progress with lifestyle modification  3. Pulmonary hypertension Managed by Dr Amy [name]  4. HTN Stable No change required today   [name] MD, [facility] 10/13/2016 8:52 AM

## 2016-10-13 NOTE — Patient Instructions (Signed)
Medication Instructions:  Your physician recommends that you continue on your current medications as directed. Please refer to the Current Medication list given to you today.   Labwork: None ordered   Testing/Procedures: None ordered   Follow-Up: Your physician recommends that you schedule a follow-up appointment in: 3 months with Dr Caryl Comes   Any Other Special Instructions Will Be Listed Below (If Applicable).     If you need a refill on your cardiac medications before your next appointment, please call your pharmacy.

## 2016-10-13 NOTE — Patient Outreach (Signed)
[facility]) Care Management  10/13/2016  Amy [name] 07-31-1945 [id number]   CSW was able to make initial contact with patient today to perform phone assessment, as well as assess and assist with social work needs and services.  CSW introduced self, explained role and types of services provided through [place] [facility] ([place] [facility]).  CSW further explained to patient that CSW works with patient's Telephonic RNCM, also with [place] [facility], Amy [name]. CSW then explained the reason for the call, indicating that Mrs. [name] thought that patient would benefit from social work services and resources to assist with arranging assisted living placement for patient.  CSW obtained two HIPAA compliant identifiers from patient, which included patient's name and date of birth. CSW noted that patient scored a 4 on the PHQ-2 & 9 Depression Screening, inquiring as to whether or not patient would be interested in counseling and/or supportive services.  Patient denied, reporting that she does not wish to take psychotropic medications or see a therapist for psychotherapeutic services. Patient did not feel the need to receive literature regarding signs and symptoms of depression. CSW encouraged patient to notify CSW if she changes her mind.  Patient voiced understanding and was agreeable to this plan. Patient reported that she is considering assisted living placement, having already visited several assisted living facilities, which include all of the following: [place] [facility] Patient reports that she is still weighing all her options and plans to tour various other facilities. Patient is also registered with [facility], an agency that assists individuals with placement into long-term care assisted living facilities. Patient  indicated that her two daughters are touring facilities with her, and will assist her with the decision-making process. CSW agreed to mail patient a complete list of all assisted living facilities in [facility], as well as a Veterinary surgeon.  CSW will then follow-up with patient next Wednesday to ensure that patient received the packet of resource information mailed, as well as answer any questions she may have at that time. Amy [name], BSW, MSW, LCSW  Licensed Education officer, environmental Health System  Mailing [place] [address] [place], [place] [address] Physical Address-[address], [place], [place] [address] Toll Free Main # [phone] Fax # [phone] Cell # [phone]  Office # [phone] [name]

## 2016-10-13 NOTE — Patient Outreach (Signed)
[facility]) Care Management  [place]   10/13/2016  Amy [name] [birth year] [id number]  Subjective: Patient was called regarding medication management per referral from [place], Nada Boozer.  HIPAA identifiers were obtained.  Patient is a 71 year old female with multiple medical conditions including but not limited to:  Hypertension, hyperlipidemia, CHF, ASCAD, OSA, Atrial Fibrillation, pulmonary hypertension and morbid obesity.  Patient reported managing her medications on her own without issue. She requested a pharmacist to call her because she wanted to know if there any medications she could stop taking.  She reported using a seven day pill box with one slot per day.   Objective:   Encounter Medications: Outpatient Encounter Prescriptions as of 10/13/2016  Medication Sig  . acetaminophen (TYLENOL) 325 MG tablet Take 2 tablets (650 mg total) by mouth every 4 (four) hours as needed for headache or mild pain.  Marland Kitchen atorvastatin (LIPITOR) 80 MG tablet Take 1 tablet (80 mg total) by mouth daily with lunch.  . Cholecalciferol (VITAMIN D) 2000 UNITS CAPS Take 2,000 Units by mouth daily.   . colchicine 0.6 MG tablet Take 0.6 mg by mouth daily.   . cyclobenzaprine (FLEXERIL) 5 MG tablet Take 1 tablet (5 mg total) by mouth 3 (three) times daily as needed for muscle spasms.  Marland Kitchen ezetimibe (ZETIA) 10 MG tablet TAKE 1 TABLET (10 MG TOTAL) BY MOUTH IN THE EVENING  . furosemide (LASIX) 20 MG tablet Take 1 tablet (20 mg total) by mouth daily.  . Glycopyrrolate-Formoterol (BEVESPI AEROSPHERE) 9-4.8 MCG/ACT AERO Inhale 1 puff into the lungs 2 (two) times daily.  Marland Kitchen levothyroxine (SYNTHROID, LEVOTHROID) 100 MCG tablet Take 100 mcg by mouth daily before breakfast.  . Macitentan (OPSUMIT) 10 MG TABS Take 1 tablet (10 mg total) by mouth daily.  . metoprolol tartrate (LOPRESSOR) 25 MG tablet TAKE 0.5 TABLETS (12.5 MG TOTAL) BY MOUTH 2 (TWO) TIMES DAILY.  .  MULTAQ 400 MG tablet TAKE 1 TABLET BY MOUTH TWICE A DAY WITH A MEAL  . Multiple Vitamin (MULTIVITAMIN) capsule Take 1 capsule by mouth daily.  Marland Kitchen NITROSTAT 0.4 MG SL tablet PLACE 1 TABLET (0.4 MG TOTAL) UNDER THE TONGUE EVERY 5 (FIVE) MINUTES AS NEEDED FOR CHEST PAIN.  Marland Kitchen pantoprazole (PROTONIX) 40 MG tablet TAKE 1 TABLET (40 MG TOTAL) BY MOUTH DAILY.  Marland Kitchen potassium chloride SA (K-DUR,KLOR-CON) 20 MEQ tablet Take 0.5 tablets (10 mEq total) by mouth at bedtime.  . Rivaroxaban (XARELTO) 15 MG TABS tablet Take 1 tablet (15 mg total) by mouth daily with supper.  Marland Kitchen UNABLE TO FIND Med Name: CPAP  DME-[facility]   No facility-administered encounter medications on file as of 10/13/2016.     Functional Status: In your present state of health, do you have any difficulty performing the following activities: 10/01/2016 06/23/2016  Hearing? Y N  Vision? N N  Difficulty concentrating or making decisions? N N  Walking or climbing stairs? Y Y  Dressing or bathing? N N  Doing errands, shopping? N N  Some recent data might be hidden    Fall/Depression Screening: No flowsheet data found. PHQ 2/9 Scores 10/06/2016  PHQ - 2 Score 2  PHQ- 9 Score 4      Assessment:  Patient's medications were reviewed over the phone.   Drugs sorted by system:  Neurologic/Psychologic:   Cardiovascular: Atorvastatin Ezetimibe Furosemide Metoprolol Multaq Xarelto Nitrostat   Pulmonary/Allergy:  Gastrointestinal: Pantoprazole   Endocrine:  Renal:  Topical:  Pain:  Vitamins/Minerals: Multivitamin  Potassium   Possible Deprescribing Opportunity:  1.  Pantoprazole-if deemed therapeutically appropriate, pantoprazole dose could be changed to PRN versus daily dosing and if after PRN dosing the patient does not have symptoms, discontinued (if the patient does not have Barrett's Esophagus or history of GI bleed).   Plan:   1.  Route note to PCP 2.  Take patient a new pill box this week. 3.  Follow up with  patient in 10 business days 4. Close pharmacy case if no other issues.    Elayne [name], PharmD, [place] Clinical Pharmacist [phone]

## 2016-10-14 ENCOUNTER — Ambulatory Visit: Payer: Medicare Other | Admitting: *Deleted

## 2016-10-15 ENCOUNTER — Ambulatory Visit (HOSPITAL_COMMUNITY)
Admission: RE | Admit: 2016-10-15 | Discharge: 2016-10-15 | Disposition: A | Discharge status: Home / Self Care | Payer: Medicare Other | Source: Ambulatory Visit | Attending: Internal Medicine | Admitting: Internal Medicine

## 2016-10-15 ENCOUNTER — Encounter (HOSPITAL_COMMUNITY): Payer: Self-pay

## 2016-10-15 VITALS — BP 128/78 | HR 55 | Wt 188.6 lb

## 2016-10-15 DIAGNOSIS — E039 Hypothyroidism, unspecified: Secondary | ICD-10-CM | POA: Insufficient documentation

## 2016-10-15 DIAGNOSIS — Z79899 Other long term (current) drug therapy: Secondary | ICD-10-CM | POA: Diagnosis not present

## 2016-10-15 DIAGNOSIS — I480 Paroxysmal atrial fibrillation: Secondary | ICD-10-CM

## 2016-10-15 DIAGNOSIS — Z7901 Long term (current) use of anticoagulants: Secondary | ICD-10-CM | POA: Insufficient documentation

## 2016-10-15 DIAGNOSIS — K219 Gastro-esophageal reflux disease without esophagitis: Secondary | ICD-10-CM | POA: Diagnosis not present

## 2016-10-15 DIAGNOSIS — I5032 Chronic diastolic (congestive) heart failure: Secondary | ICD-10-CM

## 2016-10-15 DIAGNOSIS — J449 Chronic obstructive pulmonary disease, unspecified: Secondary | ICD-10-CM | POA: Diagnosis not present

## 2016-10-15 DIAGNOSIS — Z87891 Personal history of nicotine dependence: Secondary | ICD-10-CM | POA: Insufficient documentation

## 2016-10-15 DIAGNOSIS — I270 Primary pulmonary hypertension: Secondary | ICD-10-CM | POA: Diagnosis not present

## 2016-10-15 DIAGNOSIS — I2721 Secondary pulmonary arterial hypertension: Secondary | ICD-10-CM | POA: Diagnosis not present

## 2016-10-15 DIAGNOSIS — I2510 Atherosclerotic heart disease of native coronary artery without angina pectoris: Secondary | ICD-10-CM | POA: Diagnosis not present

## 2016-10-15 DIAGNOSIS — E785 Hyperlipidemia, unspecified: Secondary | ICD-10-CM | POA: Diagnosis not present

## 2016-10-15 DIAGNOSIS — I422 Other hypertrophic cardiomyopathy: Secondary | ICD-10-CM | POA: Diagnosis not present

## 2016-10-15 DIAGNOSIS — G4733 Obstructive sleep apnea (adult) (pediatric): Secondary | ICD-10-CM | POA: Insufficient documentation

## 2016-10-15 NOTE — Patient Instructions (Signed)
You have been referred to pulmonary rehab, they will contact you for orientation  Your physician recommends that you schedule a follow-up appointment in: 6-8 weeks with Dr Aundra Dubin

## 2016-10-15 NOTE — Progress Notes (Signed)
PCP: Dr. Radene Ou Cardiology: Dr. Radford Pax HF Cardiology: Dr. Aundra Dubin  Amy [name] was referred by Dr. Radford Pax for evaluation of pulmonary hypertension and CHF.   71 yo with history of COPD, OSA on CPAP, apical hypertrophic cardiomyopathy, chronic diastolic CHF, and paroxysmal atrial fibrillation presents for evaluation of CHF and pulmonary hypertension.  Patient was admitted in 3/18 with CHF and dyspnea, found to have pulmonary arterial hypertension by RHC.  Prior to this, she has had a long history of PAF.  She is currently taking dronedarone but has had breakthrough fibrillation.  She is in NSR today.  She had an ablation in the past and is being evaluated for consideration of redo ablation. She has apical hypertrophic cardiomyopathy proven by cardiac MRI.   Admitted 6/27 - 10/02/16 with symptomatic Afib. Converted to NSR with IV diltiazem. Not candidate for Tikosyn or Sotalol with prolonged QTc. BB not increased with soft pressures.    She presents today for post hospital follow up. Saw Dr. Rayann Heman 10/13/16. Recommended continuing her Multaq at current dose. Also recommended additional Metoprolol PRN for afib with RVR. She has not had any further episodes. Pt states she felt worse after starting Adcirca ( Had previously had similar reaction). Now feeling much better off. Breathing has been OK. She is on continuous 02. Walks 4-5 minutes prior to SOB. Denies CP.   ECG: Personally reviewed, 10/14/16 Sinus bradycardia.  Labs (2/18): TSH normal Labs (3/18): K 3.8, creatinine 1.26, BNP 424 Labs (5/18): K 4.4, creatinine 1.96, anti-SCL 70 negative, RF very slightly elevated, HIV negative.   4//18 6 minute walk: 109 m   PMH:  1. COPD: Quit smoking 2011.  - PFTs (3/18): minimal obstruction, minimal restriction, DLCO 22% (severely decreased).  2. Hypothyroidism 3. Hyperlipidemia 4. OSA: Uses CPAP 5. Apical hypertrophic cardiomyopathy:  - Cardiac MRI (7/16) with EF 79%, mid-apical LV hypertrophy with  spade-like ventricle, mid-myocardial LGE was noted at the apex.   6. GERD 7. Atrial fibrillation: Paroxysmal.    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  - Long QT interval so dofetilide and sotalol have not been used.  - She has had atrial fibrillation ablation.  - Currently on dronedarone.  8. Coronary artery disease: LHC (7/16) with nonobstructive disease + 85% ostial stenosis small D1.  9. Diastolic CHF: Echo (7/01) with EF 65-70%, apical hypertrophic cardiomyopathy, severe LAE, PASP 86 mmHg.  - RHC (3/18): mean RA 4, PA 69/23 mean 38, mean PCWP 8, CI 2.17, PVR 7.3 WU Fick, 9.6 WU thermo.  10. Pulmonary hypertension: See RHC above.  - V/Q scan negative 3/18 - HIV, SCL70 negative.  RF borderline elevated, doubt significant.  11. CPX (12/17): peak VO2 8.5, RER 0.96, VE/VCO2 slope 67. Submaximal but appears to show severe functional impairment.    Social History   Social History  . Marital status: Widowed    Spouse name: divorced.  . Number of children: 1  . Years of education: N/A   Occupational History  . retired. still works for [employer].     Social History Main Topics  . Smoking status: Former Smoker    Packs/day: 0.50    Years: 44.00    Types: Cigarettes    Quit date: 04/07/2009  . Smokeless tobacco: Never Used  . Alcohol use No  . Drug use: No  . Sexual activity: No   Other Topics Concern  . Not on file   Social History Narrative   Pt lives alone with 2 dogs.    Family History  Problem Relation Age of Onset  . Heart disease Sister        Good Pastures Syndrome  . Gout Father   . Lung cancer Father        smoked  . Breast cancer Paternal Aunt    Review of systems complete and found to be negative unless listed in HPI.    Current Outpatient Prescriptions  Medication Sig Dispense Refill  . acetaminophen (TYLENOL) 325 MG tablet Take 2 tablets (650 mg total) by mouth every 4 (four) hours as needed for headache or mild pain.    Marland Kitchen atorvastatin (LIPITOR) 80 MG tablet  Take 1 tablet (80 mg total) by mouth daily with lunch. 90 tablet 3  . Cholecalciferol (VITAMIN D) 2000 UNITS CAPS Take 2,000 Units by mouth daily.     . colchicine 0.6 MG tablet Take 0.6 mg by mouth daily.     . cyclobenzaprine (FLEXERIL) 5 MG tablet Take 1 tablet (5 mg total) by mouth 3 (three) times daily as needed for muscle spasms. 10 tablet 0  . ezetimibe (ZETIA) 10 MG tablet TAKE 1 TABLET (10 MG TOTAL) BY MOUTH IN THE EVENING 90 tablet 3  . furosemide (LASIX) 20 MG tablet Take 1 tablet (20 mg total) by mouth daily. 30 tablet 6  . Glycopyrrolate-Formoterol (BEVESPI AEROSPHERE) 9-4.8 MCG/ACT AERO Inhale 1 puff into the lungs 2 (two) times daily.    Marland Kitchen levothyroxine (SYNTHROID, LEVOTHROID) 100 MCG tablet Take 100 mcg by mouth daily before breakfast.    . Macitentan (OPSUMIT) 10 MG TABS Take 1 tablet (10 mg total) by mouth daily. 30 tablet 11  . metoprolol tartrate (LOPRESSOR) 25 MG tablet TAKE 0.5 TABLETS (12.5 MG TOTAL) BY MOUTH 2 (TWO) TIMES DAILY. 90 tablet 1  . MULTAQ 400 MG tablet TAKE 1 TABLET BY MOUTH TWICE A DAY WITH A MEAL 180 tablet 3  . Multiple Vitamin (MULTIVITAMIN) capsule Take 1 capsule by mouth daily.    Marland Kitchen NITROSTAT 0.4 MG SL tablet PLACE 1 TABLET (0.4 MG TOTAL) UNDER THE TONGUE EVERY 5 (FIVE) MINUTES AS NEEDED FOR CHEST PAIN. 25 tablet 4  . pantoprazole (PROTONIX) 40 MG tablet TAKE 1 TABLET (40 MG TOTAL) BY MOUTH DAILY. 90 tablet 2  . potassium chloride SA (K-DUR,KLOR-CON) 20 MEQ tablet Take 0.5 tablets (10 mEq total) by mouth at bedtime.    . Rivaroxaban (XARELTO) 15 MG TABS tablet Take 1 tablet (15 mg total) by mouth daily with supper. 30 tablet 11  . UNABLE TO FIND Med Name: CPAP  DME-AHC     No current facility-administered medications for this encounter.    BP 128/78   Pulse (!) 55   Wt 188 lb 9.6 oz (85.5 kg)   SpO2 90% Comment: 2L  BMI 33.41 kg/m    Wt Readings from Last 3 Encounters:  10/15/16 188 lb 9.6 oz (85.5 kg)  10/13/16 190 lb 12.8 oz (86.5 kg)   10/02/16 198 lb (89.8 kg)    General: elderly, well appearing. No resp difficulty. In WC HEENT: Normal. 02 via  Neck: Supple. JVP 7-8. Carotids 2+ bilat; no bruits. No thyromegaly or nodule noted. Cor: PMI nondisplaced. Regular, slightly brady, No M/G/R noted.  Lungs: CTAB, normal effort. Abdomen: Soft, non-tender, non-distended, no HSM. No bruits or masses. +BS  Extremities: No cyanosis, clubbing, rash, R and LLE no edema.  Neuro: Alert & orientedx3, cranial nerves grossly intact. moves all 4 extremities w/o difficulty. Affect pleasant   Assessment/Plan: 1. Chronic diastolic CHF: Echo with EF  65-70%.  There is also likely a component of RV failure from significant pulmonary hypertension.  NYHA III symptoms.  Volume status stable on exam, weight down.   - Continue lasix 20 mg daily and K 10 meq daily. Recent BMET stable.  2. Apical hypertrophic cardiomyopathy: Cardiac MRI in 7/16 showed mid-apical LV hypertrophy with vigorous contraction.  It may help to relax the LV a bit slow it to allow more diastolic filling.  - Continue metoprolol 12.5 mg BID. Will not increase with soft pressure and bradycardia  3. Atrial fibrillation:  Remains in NSR on Multaq.   - Continue Xarelto 15 mg daily. .  No bleeding or melena.   - Needs to see Dr. Rayann Heman about possible redo atrial fibrillation ablation.  4. Pulmonary hypertension: RHC in 3/18 showed moderate pulmonary arterial hypertension with elevated PVR (7.3 WU by Fick, 9.6 WU by thermo). She has COPD but I do not think this plays a large role in the pulmonary hypertension.  PFTs in 3/18 showed minimal obstruction and restriction, there was significantly decreased DLCO, this may be due to pulmonary vascular disease.  OSA may play a role but not a large one. Concerned for group 1 PAH.  V/Q scan did not suggest chronic PEs in 3/18.  RF borderline elevated (probably not significant), HIV negative, Anti-SCL70 negative.  - Continue Opsumit 10 mg daily.   -  ANA normal.  - Unable to do 6MW today as her 02 tank is running too low for her to complete and make it home.  - Plan for next visit.   RTC 6-8 weeks with Dr. Leo Rod, PA-C  10/15/2016

## 2016-10-16 ENCOUNTER — Other Ambulatory Visit: Payer: Self-pay | Admitting: *Deleted

## 2016-10-16 DIAGNOSIS — I5032 Chronic diastolic (congestive) heart failure: Secondary | ICD-10-CM | POA: Diagnosis not present

## 2016-10-16 DIAGNOSIS — I480 Paroxysmal atrial fibrillation: Secondary | ICD-10-CM | POA: Diagnosis not present

## 2016-10-16 DIAGNOSIS — N183 Chronic kidney disease, stage 3 (moderate): Secondary | ICD-10-CM | POA: Diagnosis not present

## 2016-10-16 DIAGNOSIS — I422 Other hypertrophic cardiomyopathy: Secondary | ICD-10-CM | POA: Diagnosis not present

## 2016-10-16 DIAGNOSIS — I272 Pulmonary hypertension, unspecified: Secondary | ICD-10-CM | POA: Diagnosis not present

## 2016-10-16 DIAGNOSIS — I2510 Atherosclerotic heart disease of native coronary artery without angina pectoris: Secondary | ICD-10-CM | POA: Diagnosis not present

## 2016-10-16 NOTE — Patient Outreach (Signed)
[facility]) Care Management  10/16/2016  Ebony Yorio Laux 01/02/1946 [id number]   RN spoke with pt today and introduced the [facility] program and verified [facility] contacts as noted from pharmacy and the involved [facility] social worker. Pt verified she has received pill-boxes from the pharmacy and filled those medications accordingly. Pt continued to be receptive to case management services however was currently working with [facility] today and requested to talk at a later date. RN offered to call tomorrow and arrange a scheduled appointment based upon pt's availability. Will follow up tomorrow and further engage on pt's possible needs for [facility] case management services.   [name], RN Care Management Coordinator [place] Office [phone]

## 2016-10-17 ENCOUNTER — Other Ambulatory Visit: Payer: Self-pay | Admitting: *Deleted

## 2016-10-17 ENCOUNTER — Ambulatory Visit: Payer: Medicare Other | Admitting: *Deleted

## 2016-10-17 ENCOUNTER — Encounter: Payer: Self-pay | Admitting: *Deleted

## 2016-10-17 NOTE — Patient Outreach (Signed)
[facility]) Care Management  10/17/2016  Amy [name] 11-Aug-1945 [id number]   RN spoke with pt today and introduced the [facility] program and services. RN explained the purpose for today's call and further discussed pt's needs. Pt explained her recent hospitalization last month and changes in her medications. Note Amy [name] ([place]) and Amy [name]. [facility] social worker) are currently involved with this pt. Details discussed on pt's needs related to her HF with lack of knowledge related to daily management and weights. RN educated pt on the HF zones and verified pt currently in the GREEN zone with no precipitating symptoms. Other areas discussed related to medications adherence (reviewed) with the recent changes in her medications. [name] has assisted with this however pt feels she need ongoing monitoring via home visits for ongoing education. Pt also reports use of home O2 where she has been increased from 2 to 3 liters to accomodate her breathing better. RN has offered to complete a home visit as pt agreed to a visit next week. RN will discussed goals/ plan of care and will reiterate on this during the upcoming home visit next week. No other inquires or request at this time.   Amy Mina, RN Care Management Coordinator [place] Office [phone]

## 2016-10-22 ENCOUNTER — Encounter: Payer: Self-pay | Admitting: *Deleted

## 2016-10-22 ENCOUNTER — Telehealth: Payer: Self-pay | Admitting: Cardiology

## 2016-10-22 ENCOUNTER — Other Ambulatory Visit: Payer: Self-pay | Admitting: *Deleted

## 2016-10-22 DIAGNOSIS — N183 Chronic kidney disease, stage 3 (moderate): Secondary | ICD-10-CM | POA: Diagnosis not present

## 2016-10-22 DIAGNOSIS — I2510 Atherosclerotic heart disease of native coronary artery without angina pectoris: Secondary | ICD-10-CM | POA: Diagnosis not present

## 2016-10-22 DIAGNOSIS — I5032 Chronic diastolic (congestive) heart failure: Secondary | ICD-10-CM | POA: Diagnosis not present

## 2016-10-22 DIAGNOSIS — I422 Other hypertrophic cardiomyopathy: Secondary | ICD-10-CM | POA: Diagnosis not present

## 2016-10-22 DIAGNOSIS — I272 Pulmonary hypertension, unspecified: Secondary | ICD-10-CM | POA: Diagnosis not present

## 2016-10-22 DIAGNOSIS — I480 Paroxysmal atrial fibrillation: Secondary | ICD-10-CM | POA: Diagnosis not present

## 2016-10-22 NOTE — Addendum Note (Signed)
Encounter addended by: Scarlette Calico, RN on: 10/22/2016  4:49 PM<BR>    Actions taken: Order list changed, Diagnosis association updated

## 2016-10-22 NOTE — Patient Outreach (Signed)
[facility]) Care Management  10/22/2016  Amy [name] 08-Sep-1945 [id number]   CSW made an attempt to try and contact patient today to follow-up regarding social work services and resources; however, patient was unavailable at the time of CSW's call.  A HIPAA compliant message was left for patient on voicemail.  CSW is currently awaiting a return call. CSW will make a second outreach attempt within the next week, if CSW does not receive a return call from patient in the meantime. Nat Christen, BSW, MSW, LCSW  Licensed Education officer, environmental Health System  Mailing [place] [address] [place], [place] [address] Physical Address-[address], [place], [place] [address] Toll Free Main # [phone] Fax # [phone] Cell # [phone]  Office # [phone] [email or web address]

## 2016-10-22 NOTE — Telephone Encounter (Signed)
[name] a nurse case manager calling  Pt c/o BP issue: STAT if pt c/o blurred vision, one-sided weakness or slurred speech  1. What are your last 5 BP readings? 90/50  2. Are you having any other symptoms (ex. Dizziness, headache, blurred vision, passed out)? No other symptoms reported  3. What is your BP issue? Low bp and Lattie Haw would like [name] to call the patient and advise her if she needs to take her BP medication or not. Please call patient, patient is waiting until she hears from our office.

## 2016-10-22 NOTE — Patient Outreach (Signed)
[facility]) Care Management   10/22/2016  Amy [name] 1945/10/03 [id number]  Amy [name] is an 71 y.o. female Pt receptive to enrolling into the [facility] program for community case management services. Subjective:  HF: Pt reports she has been weighing daily since the last discussion with this RN case Freight forwarder. Pt states no symptoms have occurred and verified she remains in the GREEN zone after review again of the HF signs and symptoms.  HTN: Pt reports her BP medications (Lopressor 12.52m and Opsumit 125mqd). Denies any symptoms of hypotension and states her BP was low in June upon her hospitalization.  Again pt verified she is currently asymptomatic with her BP readings today.   Objective:   Review of Systems  Constitutional: Negative.   HENT: Negative.   Eyes: Negative.   Respiratory: Negative.   Cardiovascular: Negative.   Gastrointestinal: Negative.   Genitourinary: Negative.   Musculoskeletal: Negative.   Skin: Negative.   Neurological: Negative.   Endo/Heme/Allergies: Negative.   Psychiatric/Behavioral: Negative.     Physical Exam  Constitutional: She is oriented to person, place, and time. She appears well-developed and well-nourished.  HENT:  Right Ear: External ear normal.  Left Ear: External ear normal.  Eyes: EOM are normal.  Neck: Normal range of motion.  Cardiovascular: Normal heart sounds.   Respiratory: Effort normal and breath sounds normal.  GI: Soft. Bowel sounds are normal.  Genitourinary: Vaginal discharge found.  Musculoskeletal: Normal range of motion.  Neurological: She is alert and oriented to person, place, and time.  Psychiatric: She has a normal mood and affect. Her behavior is normal. Judgment and thought content normal.    Encounter Medications:   Outpatient Encounter Prescriptions as of 10/22/2016  Medication Sig  . acetaminophen (TYLENOL) 325 MG tablet Take 2 tablets (650 mg total) by mouth every 4 (four)  hours as needed for headache or mild pain.  . Marland Kitchentorvastatin (LIPITOR) 80 MG tablet Take 1 tablet (80 mg total) by mouth daily with lunch.  . Cholecalciferol (VITAMIN D) 2000 UNITS CAPS Take 2,000 Units by mouth daily.   . colchicine 0.6 MG tablet Take 0.6 mg by mouth daily.   . cyclobenzaprine (FLEXERIL) 5 MG tablet Take 1 tablet (5 mg total) by mouth 3 (three) times daily as needed for muscle spasms.  . Marland Kitchenzetimibe (ZETIA) 10 MG tablet TAKE 1 TABLET (10 MG TOTAL) BY MOUTH IN THE EVENING  . furosemide (LASIX) 20 MG tablet Take 1 tablet (20 mg total) by mouth daily.  . Glycopyrrolate-Formoterol (BEVESPI AEROSPHERE) 9-4.8 MCG/ACT AERO Inhale 1 puff into the lungs 2 (two) times daily.  . Marland Kitchenevothyroxine (SYNTHROID, LEVOTHROID) 100 MCG tablet Take 100 mcg by mouth daily before breakfast.  . Macitentan (OPSUMIT) 10 MG TABS Take 1 tablet (10 mg total) by mouth daily.  . metoprolol tartrate (LOPRESSOR) 25 MG tablet TAKE 0.5 TABLETS (12.5 MG TOTAL) BY MOUTH 2 (TWO) TIMES DAILY.  . MULTAQ 400 MG tablet TAKE 1 TABLET BY MOUTH TWICE A DAY WITH A MEAL  . Multiple Vitamin (MULTIVITAMIN) capsule Take 1 capsule by mouth daily.  . Marland KitchenITROSTAT 0.4 MG SL tablet PLACE 1 TABLET (0.4 MG TOTAL) UNDER THE TONGUE EVERY 5 (FIVE) MINUTES AS NEEDED FOR CHEST PAIN.  . Marland Kitchenantoprazole (PROTONIX) 40 MG tablet TAKE 1 TABLET (40 MG TOTAL) BY MOUTH DAILY.  . Marland Kitchenotassium chloride SA (K-DUR,KLOR-CON) 20 MEQ tablet Take 0.5 tablets (10 mEq total) by mouth at bedtime.  . Rivaroxaban (XARELTO) 15 MG TABS tablet Take 1  tablet (15 mg total) by mouth daily with supper.  Marland Kitchen UNABLE TO FIND Med Name: CPAP  DME-AHC   No facility-administered encounter medications on file as of 10/22/2016.     Functional Status:   In your present state of health, do you have any difficulty performing the following activities: 10/17/2016 10/13/2016  Hearing? Tempie Donning  Vision? N N  Difficulty concentrating or making decisions? N N  Walking or climbing stairs? Y Y  Dressing  or bathing? N N  Doing errands, shopping? N N  Preparing Food and eating ? N N  Using the Toilet? N N  In the past six months, have you accidently leaked urine? N N  Do you have problems with loss of bowel control? N N  Managing your Medications? N N  Managing your Finances? N N  Housekeeping or managing your Housekeeping? Y Y  Some recent data might be hidden    Fall/Depression Screening:    Fall Risk  10/17/2016 10/13/2016  Falls in the past year? No No  Risk for fall due to : - Impaired mobility   PHQ 2/9 Scores 10/17/2016 10/13/2016 10/06/2016  PHQ - 2 Score _0 PHQ- 9 Score - 4 4   BP (!) 90/52 (BP Location: Right Arm, Patient Position: Sitting, Cuff Size: Normal)   Pulse (!) 56   Resp 20   Ht 1.6 m (_1 )   Wt 188 lb 12.8 oz (85.6 kg)   SpO2 92%   BMI 33.44 kg/m  Assessment:  Enrollment into the [facility] program and services Case management services related to HF/HTN Adherence related to daily weights  Plan:  Will obtain a signed consent for enrollment into the [facility] program and services. Will continue education on HF and reiterated on the HF zones and verify pt remains in the GREEN zone with no precipitating symptoms. Will verify pt remains asymptomatic and continues to weigh daily and document all weights for providers to view.  Will contact Dr. Fransico Him concerning hypotension reads today 90/50-left arm and 90/52-right arm for possible intervention. Will also verify pt remains asymptomatic with no major issues.   Plan of care discussed along with all goals as pt remains receptive to this plan and will adhere accordingly. No other inquire or request at this time.   Addendum: Spoke with [id number] with Dr. Radford Pax (CAD) office and reported the above vitals and pt remaining asymptomatic with pending BP medication that is scheduled for later today. Representative will report RN findings and RN has requested office to contact the pt directly with any interventions. RN will follow up  with pt at a later date and scheduled the next home visit accordingly.  [name], RN Care Management Coordinator [place] Office [phone]

## 2016-10-22 NOTE — Telephone Encounter (Signed)
Patient states her BP decreased to 90/50 today. Earlier this morning, her blood pressure was 124/60s checked manually by a nurse. This afternoon, someone else checked it and it was 90/50. Upon recheck a little while later, her BP was 110/60s. She has been asymptomatic and denies dizziness and lightheadedness. Her HR stays in the 60s and 70s.  She did not take her Metoprolol today because she was scared about her BP. Since her last BP was back at baseline, instructed patient to take her medications as directed. She will check BP daily for a week and call with results. She understands she will be called if Dr. Radford Pax has further recommendations.

## 2016-10-22 NOTE — Patient Outreach (Signed)
[facility]) Care Management  10/22/2016  Amy [name]  1945-10-10 [id number]   CSW received a return call from patient today, in response to the HIPAA compliant message left for patient by CSW earlier in the day.  Patient reports that she is doing well, staying busy, and continuing to visit additional assisted living facilities.  Patient admits that she is in no rush to move, as she just recently signed a one-year lease at her current place of residence.  Patient just wants to be proactive and prepared, when the time comes that she will require additional assistance, care and supervision. CSW was able to provide patient with the name and contact information of a few agencies that may be able to assist her with packing her belongings, as well as the actual move, when the time comes. Patient reported that she received the packet of resource information mailed to her home by CSW, which included information about Depression, Signs and Symptoms.  Patient has had an opportunity to review it's contents and reports that her mood has actually improved imensly.  Patient does not know what to attribute this too, but believes that it may be because she is trying to get out of the house more often and is learning to properly function with her new oxygen tank. Patient was unable to identify any additional social work needs at present, but has CSW's contact information and has agreed to contact CSW directly if needs arise in the near future. CSW will perform a case closure on patient, as all goals of treatment have been met from social work standpoint and no additional social work needs have been identified at this time.  CSW will notify patient's RNCM with [place] [facility], [name] of CSW's plans to close patient's case.  CSW will fax an update to patient's Primary Care Physician, Dr. Jordan Hawks Rankins to ensure that they are aware of CSW's involvement with patient's  plan of care.  CSW will submit a case closure request to Verlon Setting, Care Management Assistant with [place] [facility], in the form of an [employer].  CSW will ensure that [name] is aware of Ms. Zigmund Daniel, RNCM with [place] [facility], continued involvement with patient's care. Nat Christen, BSW, MSW, LCSW  Licensed Education officer, environmental Health System  Mailing [place] [address], [place], [place] [address] Physical Address-[address], [place] [address] Toll Free Main # [phone] Fax # [phone] Cell # [phone]  Office # [phone] [email or web address]

## 2016-10-22 NOTE — Telephone Encounter (Signed)
Stop metoprolol and use compression hose during the day

## 2016-10-23 NOTE — Telephone Encounter (Signed)
Instructed patient to STOP METOPROLOL.  Mailed her rx for compression stockings. Patient understands to call if she has palpitations or racing heart. She was grateful for call.

## 2016-10-26 ENCOUNTER — Other Ambulatory Visit: Payer: Self-pay | Admitting: Internal Medicine

## 2016-10-28 ENCOUNTER — Other Ambulatory Visit: Payer: Self-pay | Admitting: Pharmacist

## 2016-10-28 ENCOUNTER — Telehealth: Payer: Self-pay

## 2016-10-28 DIAGNOSIS — I2510 Atherosclerotic heart disease of native coronary artery without angina pectoris: Secondary | ICD-10-CM | POA: Diagnosis not present

## 2016-10-28 DIAGNOSIS — I272 Pulmonary hypertension, unspecified: Secondary | ICD-10-CM | POA: Diagnosis not present

## 2016-10-28 DIAGNOSIS — I480 Paroxysmal atrial fibrillation: Secondary | ICD-10-CM | POA: Diagnosis not present

## 2016-10-28 DIAGNOSIS — N183 Chronic kidney disease, stage 3 (moderate): Secondary | ICD-10-CM | POA: Diagnosis not present

## 2016-10-28 DIAGNOSIS — I422 Other hypertrophic cardiomyopathy: Secondary | ICD-10-CM | POA: Diagnosis not present

## 2016-10-28 DIAGNOSIS — I5032 Chronic diastolic (congestive) heart failure: Secondary | ICD-10-CM | POA: Diagnosis not present

## 2016-10-28 MED ORDER — METOPROLOL TARTRATE 25 MG PO TABS: 12.5000 mg | ORAL_TABLET | Freq: Every day | ORAL | 11 refills | Status: DC | PRN

## 2016-10-28 NOTE — Progress Notes (Signed)
Per Dr. Jackalyn Lombard note earlier this month she is to take metoprolol PRN

## 2016-10-28 NOTE — Patient Outreach (Signed)
[facility]) Care Management  10/28/2016  Amy [name]  1946-01-29 [id number]  Called patient to follow up with her.  HIPAA identifiers were obtained.  Spoke with patient about her blood pressure, pulse and recent metoprolol medication changes.  On 10/22/16, Dr. Jule Economy office discontinued Metoprolol 56m and removed it from her active medication list. In addition, the notes from the provider ask the patient to call if she experiences palpitations.   Patient reported still taking metoprolol if her pulse is more than 90 bpm.  Patient stated she did not understand metoprolol to be discontinued all together. Therefore,when her pulse increases to >90 bpm she takes 1/2 tablet.  Patient reported she has not taken more than 1/2 Metoprolol 271mtablet in one day since she had the low blood pressure episode and Dr. TuTheodosia Blenderffice was called 10/22/16.  Patient also stated that the elevations in her heart rate were not bad enough she thought she needed to alert the provider.  Otherwise, patient did not report any issues with her medications.  Plan:  1. Route note to Dr. TuTheodosia Blenderffice 2. Send Dr. TuTheodosia Blenderurse an inbox 3. Consult with LiRaina Mina. Follow up in 2-3 business days.

## 2016-10-28 NOTE — Telephone Encounter (Signed)
-----  Message from Elayne [name], [facility] sent at 10/28/2016 11:15 AM EDT ----- Regarding: From Dr. Radford Pax   ----- Message ----- From: Sueanne Margarita, MD Sent: 10/28/2016  10:42 AM To: Elayne [name], [facility]  Per Dr. Bonita Quin note earlier this month she is to take Metoprolol PRN  Traci ----- Message ----- From: Elayne [name], [facility] Sent: 10/28/2016  10:21 AM To: Sueanne Margarita, MD  Dr. Radford Pax,  Please see the attached [facility] Pharmacist's note.  Patient was called today to follow up on medication management. While speaking with the patient, she reported still taking metoprolol 11m 1/2 tablet on a prn type scheduled (no more than once daily when her pulse is > 90 BPM).  From the telephone notes, it looks like metoprolol was discontinued.  Can you clarify if the patient should be taking metoprolol?  (I sent a mote to [employer]).  Thank you so much for your time and consideration.   Blessings,  KElayne [name] PharmD, [place] ([phone]

## 2016-10-28 NOTE — Telephone Encounter (Signed)
7/9- Dr. Rayann Heman instructed patient to take an additional metoprolol 12.5 mg PRN for afib c RVR. Dr. Radford Pax stopped metoprolol 7/18 due to hypotension.   Called patient. Her HR was elevated over the weekend around 100. She took metoprolol 12.5 mg.  Patient is anxious about HR that high as she is usually in the 50-60 range.  Since metoprolol was stopped to to low blood pressure, informed patient she may continue to take metoprolol 12.5 mg daily PRN for afib but she should ideally check her BP first. If it is too low, she should call the office and not take the medication.  She was grateful for call and agrees with treatment plan.  Med list updated.

## 2016-10-29 ENCOUNTER — Ambulatory Visit: Payer: Self-pay | Admitting: *Deleted

## 2016-10-30 ENCOUNTER — Telehealth: Payer: Self-pay | Admitting: Cardiology

## 2016-10-30 ENCOUNTER — Other Ambulatory Visit: Payer: Self-pay | Admitting: Pharmacist

## 2016-10-30 NOTE — Telephone Encounter (Signed)
New message    Patient c/o Palpitations:  High priority if patient c/o lightheadedness and shortness of breath.  1. How long have you been having palpitations? 5 am  2. Are you currently experiencing lightheadedness and shortness of breath? No-pt states it woke her up from sleeping though  3. Have you checked your BP and heart rate? (document readings) hr-93/76 p-124 at 5am.830-86/62 Now-p-69  4. Are you experiencing any other symptoms? No

## 2016-10-30 NOTE — Telephone Encounter (Signed)
Patient called to report she woke up at 0500 this AM because her HR was "flying." It was 124 bpm according to her FitBit. Her HR stayed in range of 100-130 for about an hour while she waited for it to break. She had no other symptoms. She took Metoprolol 12.5 mg and the elevated HR returned to normal. Her BP is now 117/81 and her HR is 62 and she is asymptomatic.  Reiterated to patient that she did a great job taking her PRN medication. She understands to call if her HR is elevated and is not relieved with PRN metoprolol or if other symptoms occur. She understands she will be called if Dr. Radford Pax has further recommendations at this time.

## 2016-10-30 NOTE — Patient Outreach (Signed)
[facility]) Care Management  10/30/2016  Amy [name] 02-19-1946 [id number]  Patient was called to follow up on Metoprolol.  No answer. HIPAA compliant message left on her voicemail.  Patient's PCP responded to the metoprolol questions and updated the patient's medication list.  Plan:  Follow up with the patient in 7-10 business days. Close pharmacy case.   Elayne [name], PharmD, [place] Clinical Pharmacist [phone]

## 2016-11-05 ENCOUNTER — Ambulatory Visit: Payer: Medicare Other | Admitting: Internal Medicine

## 2016-11-07 ENCOUNTER — Encounter (HOSPITAL_COMMUNITY)
Admission: RE | Admit: 2016-11-07 | Discharge: 2016-11-07 | Disposition: A | Discharge status: Home / Self Care | Payer: Medicare Other | Source: Ambulatory Visit | Attending: Cardiology | Admitting: Cardiology

## 2016-11-07 ENCOUNTER — Encounter (HOSPITAL_COMMUNITY): Payer: Self-pay

## 2016-11-07 VITALS — BP 112/62 | HR 51 | Resp 18 | Ht 62.0 in | Wt 189.4 lb

## 2016-11-07 DIAGNOSIS — Z7901 Long term (current) use of anticoagulants: Secondary | ICD-10-CM | POA: Diagnosis not present

## 2016-11-07 DIAGNOSIS — Z79899 Other long term (current) drug therapy: Secondary | ICD-10-CM | POA: Insufficient documentation

## 2016-11-07 DIAGNOSIS — I5032 Chronic diastolic (congestive) heart failure: Secondary | ICD-10-CM | POA: Diagnosis not present

## 2016-11-07 DIAGNOSIS — J449 Chronic obstructive pulmonary disease, unspecified: Secondary | ICD-10-CM | POA: Diagnosis not present

## 2016-11-07 DIAGNOSIS — Z87891 Personal history of nicotine dependence: Secondary | ICD-10-CM | POA: Diagnosis not present

## 2016-11-07 DIAGNOSIS — M519 Unspecified thoracic, thoracolumbar and lumbosacral intervertebral disc disorder: Secondary | ICD-10-CM | POA: Diagnosis not present

## 2016-11-07 DIAGNOSIS — I130 Hypertensive heart and chronic kidney disease with heart failure and stage 1 through stage 4 chronic kidney disease, or unspecified chronic kidney disease: Secondary | ICD-10-CM | POA: Insufficient documentation

## 2016-11-07 DIAGNOSIS — I272 Pulmonary hypertension, unspecified: Secondary | ICD-10-CM | POA: Insufficient documentation

## 2016-11-07 DIAGNOSIS — H3530 Unspecified macular degeneration: Secondary | ICD-10-CM | POA: Insufficient documentation

## 2016-11-07 DIAGNOSIS — I2510 Atherosclerotic heart disease of native coronary artery without angina pectoris: Secondary | ICD-10-CM | POA: Insufficient documentation

## 2016-11-07 DIAGNOSIS — G4733 Obstructive sleep apnea (adult) (pediatric): Secondary | ICD-10-CM | POA: Diagnosis not present

## 2016-11-07 DIAGNOSIS — I481 Persistent atrial fibrillation: Secondary | ICD-10-CM | POA: Insufficient documentation

## 2016-11-07 DIAGNOSIS — E039 Hypothyroidism, unspecified: Secondary | ICD-10-CM | POA: Insufficient documentation

## 2016-11-07 DIAGNOSIS — E785 Hyperlipidemia, unspecified: Secondary | ICD-10-CM | POA: Insufficient documentation

## 2016-11-07 DIAGNOSIS — K219 Gastro-esophageal reflux disease without esophagitis: Secondary | ICD-10-CM | POA: Insufficient documentation

## 2016-11-07 DIAGNOSIS — N183 Chronic kidney disease, stage 3 (moderate): Secondary | ICD-10-CM | POA: Insufficient documentation

## 2016-11-07 DIAGNOSIS — R001 Bradycardia, unspecified: Secondary | ICD-10-CM | POA: Diagnosis not present

## 2016-11-07 NOTE — Progress Notes (Signed)
Amy [name] 71 y.o. female Pulmonary Rehab Orientation Note Patient arrived today in Cardiac and Pulmonary Rehab for orientation to Pulmonary Rehab. She was transported from [employer] via wheel chair. She does carry portable oxygen. Per pt, she uses oxygen continuously. Color good, skin warm and dry. Patient is oriented to time and place. Patient's medical history, psychosocial health, and medications reviewed. Psychosocial assessment reveals pt lives alone. She is a widow of 2 years. She was living with her adult daughter but decided to move out in order to be a little more independent. She only lives 10 minutes away. Pt is currently retired as a Pharmacist, hospital. Pt hobbies include movie watching. Pt reports her stress level is moderate. Areas of stress/anxiety include potentially have to put her dog of 12 years to sleep soon.  Pt does not exhibit signs of depression however she does express sadness. PHQ2/9 score 0/na. Pt shows good  coping skills with positive outlook. She is offered emotional support and reassurance. Will continue to monitor and evaluate progress toward psychosocial goal(s) of managing her sadness through frequent encounters with her daughter and church family. Physical assessment reveals heart rate is normal, breath sounds clear to auscultation, no wheezes, rales, or rhonchi. Grip strength equal, strong. Distal pulses palpable. No edema noted. Patient reports she does take medications as prescribed. Patient states she follows a Regular diet. The patient reports no specific efforts to gain or lose weight.. Patient's weight will be monitored closely. Demonstration and practice of PLB using pulse oximeter. Patient able to return demonstration satisfactorily. Safety and hand hygiene in the exercise area reviewed with patient. Patient voices understanding of the information reviewed. Department expectations discussed with patient and achievable goals were set. The patient shows enthusiasm  about attending the program and we look forward to working with this nice lady. The patient is scheduled for a 6 min walk test on 11/11/16 and to begin exercise on 11/18/16.   45 minutes was spent on a variety of activities such as assessment of the patient, obtaining baseline data including height, weight, BMI, and grip strength, verifying medical history, allergies, and current medications, and teaching patient strategies for performing tasks with less respiratory effort with emphasis on pursed lip breathing.

## 2016-11-11 ENCOUNTER — Encounter (HOSPITAL_COMMUNITY)
Admission: RE | Admit: 2016-11-11 | Discharge: 2016-11-11 | Disposition: A | Discharge status: Home / Self Care | Payer: Medicare Other | Source: Ambulatory Visit | Attending: Cardiology | Admitting: Cardiology

## 2016-11-11 ENCOUNTER — Other Ambulatory Visit: Payer: Self-pay | Admitting: Pharmacist

## 2016-11-11 DIAGNOSIS — N183 Chronic kidney disease, stage 3 (moderate): Secondary | ICD-10-CM | POA: Diagnosis not present

## 2016-11-11 DIAGNOSIS — I272 Pulmonary hypertension, unspecified: Secondary | ICD-10-CM | POA: Diagnosis not present

## 2016-11-11 DIAGNOSIS — I2510 Atherosclerotic heart disease of native coronary artery without angina pectoris: Secondary | ICD-10-CM | POA: Diagnosis not present

## 2016-11-11 DIAGNOSIS — Z79899 Other long term (current) drug therapy: Secondary | ICD-10-CM | POA: Diagnosis not present

## 2016-11-11 DIAGNOSIS — I5032 Chronic diastolic (congestive) heart failure: Secondary | ICD-10-CM | POA: Diagnosis not present

## 2016-11-11 DIAGNOSIS — Z7901 Long term (current) use of anticoagulants: Secondary | ICD-10-CM | POA: Diagnosis not present

## 2016-11-11 NOTE — Patient Outreach (Signed)
[facility]) Care Management  11/11/2016  Braley Luckenbaugh Mandley 1945/06/12 [id number]   Called patient to follow up on metoprolol. HIPAA identifiers were obtained.  Patient reported she has discontinued metoprolol and is not having any more issues with her heart rate increasing.  In addition, patient shared she will be starting pulmonary rehab every Tuesday and Thursday afternoon.     Plan:  Call patient in 14 days then consider closing the pharmacy case.  Elayne [name], PharmD, [place] Clinical Pharmacist [phone]

## 2016-11-11 NOTE — Progress Notes (Signed)
Pulmonary Individual Treatment Plan  Patient Details  Name: Amy [name] MRN: [id number] Date of Birth: April 23, 1945 Referring Provider:     Pulmonary Rehab Walk Test from 11/11/2016 in [place]  Referring Provider  Dr. Aundra Dubin      Initial Encounter Date:    Pulmonary Rehab Walk Test from 11/11/2016 in [place]  Date  11/11/16  Referring Provider  Dr. Aundra Dubin      Visit Diagnosis: Pulmonary hypertension ([place])  Patient's Home Medications on Admission:   Current Outpatient Prescriptions:  .  acetaminophen (TYLENOL) 325 MG tablet, Take 2 tablets (650 mg total) by mouth every 4 (four) hours as needed for headache or mild pain., Disp: , Rfl:  .  atorvastatin (LIPITOR) 80 MG tablet, Take 1 tablet (80 mg total) by mouth daily with lunch., Disp: 90 tablet, Rfl: 3 .  Cholecalciferol (VITAMIN D) 2000 UNITS CAPS, Take 2,000 Units by mouth daily. , Disp: , Rfl:  .  colchicine 0.6 MG tablet, Take 0.6 mg by mouth daily. , Disp: , Rfl:  .  cyclobenzaprine (FLEXERIL) 5 MG tablet, Take 1 tablet (5 mg total) by mouth 3 (three) times daily as needed for muscle spasms., Disp: 10 tablet, Rfl: 0 .  ezetimibe (ZETIA) 10 MG tablet, TAKE 1 TABLET (10 MG TOTAL) BY MOUTH IN THE EVENING, Disp: 90 tablet, Rfl: 3 .  furosemide (LASIX) 20 MG tablet, Take 1 tablet (20 mg total) by mouth daily., Disp: 30 tablet, Rfl: 6 .  Glycopyrrolate-Formoterol (BEVESPI AEROSPHERE) 9-4.8 MCG/ACT AERO, Inhale 1 puff into the lungs 2 (two) times daily., Disp: , Rfl:  .  levothyroxine (SYNTHROID, LEVOTHROID) 100 MCG tablet, Take 100 mcg by mouth daily before breakfast., Disp: , Rfl:  .  Macitentan (OPSUMIT) 10 MG TABS, Take 1 tablet (10 mg total) by mouth daily., Disp: 30 tablet, Rfl: 11 .  metoprolol tartrate (LOPRESSOR) 25 MG tablet, Take 0.5 tablets (12.5 mg total) by mouth daily as needed., Disp: 15 tablet, Rfl: 11 .  MULTAQ 400 MG tablet, TAKE 1 TABLET BY  MOUTH TWICE A DAY WITH A MEAL, Disp: 180 tablet, Rfl: 3 .  Multiple Vitamin (MULTIVITAMIN) capsule, Take 1 capsule by mouth daily., Disp: , Rfl:  .  NITROSTAT 0.4 MG SL tablet, PLACE 1 TABLET (0.4 MG TOTAL) UNDER THE TONGUE EVERY 5 (FIVE) MINUTES AS NEEDED FOR CHEST PAIN., Disp: 25 tablet, Rfl: 4 .  pantoprazole (PROTONIX) 40 MG tablet, TAKE 1 TABLET BY MOUTH DAILY, Disp: 90 tablet, Rfl: 2 .  potassium chloride SA (K-DUR,KLOR-CON) 20 MEQ tablet, Take 0.5 tablets (10 mEq total) by mouth at bedtime., Disp: , Rfl:  .  Rivaroxaban (XARELTO) 15 MG TABS tablet, Take 1 tablet (15 mg total) by mouth daily with supper., Disp: 30 tablet, Rfl: 11 .  UNABLE TO FIND, Med Name: CPAP  DME-[facility], Disp: , Rfl:   Past Medical History: Past Medical History:  Diagnosis Date  . Apical variant hypertrophic cardiomyopathy ([place])    Diagnosed by echo and ECG 11/13  . Atypical atrial flutter ([place])   . CAD (coronary artery disease)    a. LHC in 10/2011 demonstrated non-obstructive disease and an 80% lesion in a small D1 treated medically.   . CHF (congestive heart failure) ([place])   . Chronic diastolic heart failure ([place])   . CKD (chronic kidney disease), stage III   . COPD (chronic obstructive pulmonary disease) ([place])   . Dizziness    had PT for being  off balance - in approx. 2012  . GERD (gastroesophageal reflux disease)   . Hyperlipidemia   . Hypertension   . Hypothyroid   . Lumbar disc disease   . Macular degeneration   . OSA (obstructive sleep apnea)   . Persistent atrial fibrillation ([place])       . Pulmonary hypertension ([place])    a. [place] 05/2013 showed mild PAH with normal PCWP and RA pressure, could be related to OSA and low oxygen saturation.   . Refusal of blood transfusions as patient is Jehovah's Witness   . Sinus bradycardia     Tobacco Use: History  Smoking Status  . Former Smoker  . Packs/day: 0.50  . Years: 44.00  . Types: Cigarettes  . Quit date: 04/07/2009  Smokeless Tobacco  . Never Used     Labs: Recent Review Flowsheet Data    Labs for ITP Cardiac and Pulmonary Rehab Latest Ref Rng & Units 09/27/2014 10/28/2014 06/25/2016 08/29/2016 09/12/2016   Cholestrol 100 - 199 mg/dL 137 - - 126 105   LDLCALC 0 - 99 mg/dL 72 - - 59 49   LDLDIRECT mg/dL - - - - -   HDL >39 mg/dL 49.50 - - 44 41   Trlycerides 0 - 149 mg/dL 76.0 - - 117 76   Hemoglobin A1c 4.8 - 5.6 % - 6.1(H) - - -   PHART 7.350 - 7.450 - - - - -   PCO2ART 35.0 - 45.0 mmHg - - - - -   HCO3 20.0 - 28.0 mmol/L - - 21.4 - -   TCO2 0 - 100 mmol/L - - 23 - -   ACIDBASEDEF 0.0 - 2.0 mmol/L - - 3.0(H) - -   O2SAT % - - 57.0 - -      Capillary Blood Glucose: No results found for: GLUCAP   ADL UCSD:     Pulmonary Assessment Scores    Row Name 11/11/16 1646         ADL UCSD   ADL Phase Entry       mMRC Score   mMRC Score 4        Pulmonary Function Assessment:     Pulmonary Function Assessment - 11/07/16 1502      Breath   Bilateral Breath Sounds Clear   Shortness of Breath Yes;Limiting activity      Exercise Target Goals: Date: 11/11/16  Exercise Program Goal: Individual exercise prescription set with THRR, safety & activity barriers. Participant demonstrates ability to understand and report RPE using BORG scale, to self-measure pulse accurately, and to acknowledge the importance of the exercise prescription.  Exercise Prescription Goal: Starting with aerobic activity 30 plus minutes a day, 3 days per week for initial exercise prescription. Provide home exercise prescription and guidelines that participant acknowledges understanding prior to discharge.  Activity Barriers & Risk Stratification:   6 Minute Walk:     6 Minute Walk    Row Name 11/11/16 1643         6 Minute Walk   Phase Initial     Distance 730 feet     Walk Time 6 minutes     # of Rest Breaks 0     MPH 1.38     METS 2.07     RPE 13     Perceived Dyspnea  3     Symptoms Yes (comment)     Comments wheelchair      Resting HR 66 bpm     Resting  BP 110/73     Max Ex. HR 89 bpm     Max Ex. BP 113/74       Interval HR   Baseline HR 66     1 Minute HR 72     2 Minute HR 90     3 Minute HR 89     4 Minute HR 87     5 Minute HR 89     6 Minute HR 86     2 Minute Post HR 79     Interval Heart Rate? Yes       Interval Oxygen   Interval Oxygen? Yes     Baseline Oxygen Saturation % 85 %     Baseline Liters of Oxygen 3 L     1 Minute Oxygen Saturation % 90 %     1 Minute Liters of Oxygen 4 L     2 Minute Oxygen Saturation % 89 %     2 Minute Liters of Oxygen 4 L     3 Minute Oxygen Saturation % 88 %     3 Minute Liters of Oxygen 4 L     4 Minute Oxygen Saturation % 89 %     4 Minute Liters of Oxygen 4 L     5 Minute Oxygen Saturation % 88 %     5 Minute Liters of Oxygen 4 L     6 Minute Oxygen Saturation % 85 %     6 Minute Liters of Oxygen 4 L     2 Minute Post Oxygen Saturation % 92 %     2 Minute Post Liters of Oxygen 4 L        Oxygen Initial Assessment:     Oxygen Initial Assessment - 11/11/16 1645      Initial 6 min Walk   Oxygen Used Continuous;E-Tanks   Liters per minute 4   Resting Oxygen Saturation  during 6 min walk 94 %   Exercise Oxygen Saturation  during 6 min walk 85 %     Program Oxygen Prescription   Program Oxygen Prescription Continuous;E-Tanks   Liters per minute --  4-6   Comments needs depending on activity      Oxygen Re-Evaluation:   Oxygen Discharge (Final Oxygen Re-Evaluation):   Initial Exercise Prescription:     Initial Exercise Prescription - 11/11/16 1600      Date of Initial Exercise RX and Referring Provider   Date 11/11/16   Referring Provider Dr. Aundra Dubin     Oxygen   Oxygen Continuous   Liters --  4-6     Recumbant Bike   Level 1   Minutes 17     NuStep   Level 1   Minutes 17   METs 1.3     Track   Laps 4   Minutes 17     Prescription Details   Frequency (times per week) 2   Duration Progress to 45 minutes of  aerobic exercise without signs/symptoms of physical distress     Intensity   THRR 40-80% of Max Heartrate 60-119   Ratings of Perceived Exertion 11-13   Perceived Dyspnea 0-4     Progression   Progression Continue to progress workloads to maintain intensity without signs/symptoms of physical distress.     Resistance Training   Training Prescription Yes   Weight orange bands   Reps 10-15      Perform Capillary Blood Glucose checks as needed.  Exercise Prescription Changes:  Exercise Comments:   Exercise Goals and Review:     Exercise Goals    Row Name 11/07/16 1447             Exercise Goals   Increase Physical Activity Yes       Intervention Provide advice, education, support and counseling about physical activity/exercise needs.;Develop an individualized exercise prescription for aerobic and resistive training based on initial evaluation findings, risk stratification, comorbidities and participant's personal goals.       Expected Outcomes Achievement of increased cardiorespiratory fitness and enhanced flexibility, muscular endurance and strength shown through measurements of functional capacity and personal statement of participant.       Increase Strength and Stamina Yes       Intervention Provide advice, education, support and counseling about physical activity/exercise needs.;Develop an individualized exercise prescription for aerobic and resistive training based on initial evaluation findings, risk stratification, comorbidities and participant's personal goals.       Expected Outcomes Achievement of increased cardiorespiratory fitness and enhanced flexibility, muscular endurance and strength shown through measurements of functional capacity and personal statement of participant.          Exercise Goals Re-Evaluation :   Discharge Exercise Prescription (Final Exercise Prescription Changes):   Nutrition:  Target Goals: Understanding of nutrition guidelines,  daily intake of sodium <1573m, cholesterol <2016m calories 30% from fat and 7% or less from saturated fats, daily to have 5 or more servings of fruits and vegetables.  Biometrics:     Pre Biometrics - 11/07/16 1543      Pre Biometrics   Grip Strength 23 kg       Nutrition Therapy Plan and Nutrition Goals:   Nutrition Discharge: Rate Your Plate Scores:   Nutrition Goals Re-Evaluation:   Nutrition Goals Discharge (Final Nutrition Goals Re-Evaluation):   Psychosocial: Target Goals: Acknowledge presence or absence of significant depression and/or stress, maximize coping skills, provide positive support system. Participant is able to verbalize types and ability to use techniques and skills needed for reducing stress and depression.  Initial Review & Psychosocial Screening:     Initial Psych Review & Screening - 11/07/16 1505      Initial Review   Current issues with None Identified     Family Dynamics   Good Support System? Yes   Concerns Recent loss of significant other   Comments lost husband 2 years ago. only married for 2 years     Barriers   Psychosocial barriers to participate in program There are no identifiable barriers or psychosocial needs.     Screening Interventions   Interventions Encouraged to exercise      Quality of Life Scores:   PHQ-9: Recent Review Flowsheet Data    [facility] 11/07/2016 10/17/2016 10/13/2016 10/06/2016   Decreased Interest _0 Down, Depressed, Hopeless 0 0 1 1   PHQ - 2 Score _1 Altered sleeping - - 1 1   Tired, decreased energy - - 1 1   Change in appetite - - 0 0   Feeling bad or failure about yourself  - - 0 -   Trouble concentrating - - 0 0   Moving slowly or fidgety/restless - - 0 0   Suicidal thoughts - - 0 0   PHQ-9 Score - - 4 4   Difficult doing work/chores - - Not difficult at all -     Interpretation of Total Score  Total Score Depression Severity:  1-4 = Minimal depression, 5-9 =  Mild depression, 10-14 = Moderate depression, 15-19 = Moderately severe depression, 20-27 = Severe depression   Psychosocial Evaluation and Intervention:     Psychosocial Evaluation - 11/07/16 1506      Psychosocial Evaluation & Interventions   Interventions Encouraged to exercise with the program and follow exercise prescription   Continue Psychosocial Services  Follow up required by staff      Psychosocial Re-Evaluation:   Psychosocial Discharge (Final Psychosocial Re-Evaluation):   Education: Education Goals: Education classes will be provided on a weekly basis, covering required topics. Participant will state understanding/return demonstration of topics presented.  Learning Barriers/Preferences:     Learning Barriers/Preferences - 11/07/16 1501      Learning Barriers/Preferences   Learning Barriers None   Learning Preferences Group Instruction;Individual Instruction;Verbal Instruction;Written Material      Education Topics: Risk Factor Reduction:  -Group instruction that is supported by a PowerPoint presentation. Instructor discusses the definition of a risk factor, different risk factors for pulmonary disease, and how the heart and lungs work together.     Nutrition for Pulmonary Patient:  -Group instruction provided by PowerPoint slides, verbal discussion, and written materials to support subject matter. The instructor gives an explanation and review of healthy diet recommendations, which includes a discussion on weight management, recommendations for fruit and vegetable consumption, as well as protein, fluid, caffeine, fiber, sodium, sugar, and alcohol. Tips for eating when patients are short of breath are discussed.   Pursed Lip Breathing:  -Group instruction that is supported by demonstration and informational handouts. Instructor discusses the benefits of pursed lip and diaphragmatic breathing and detailed demonstration on how to preform both.     Oxygen  Safety:  -Group instruction provided by PowerPoint, verbal discussion, and written material to support subject matter. There is an overview of "What is Oxygen" and "Why do we need it".  Instructor also reviews how to create a safe environment for oxygen use, the importance of using oxygen as prescribed, and the risks of noncompliance. There is a brief discussion on traveling with oxygen and resources the patient may utilize.   Oxygen Equipment:  -Group instruction provided by [facility] Staff utilizing handouts, written materials, and equipment demonstrations.   Signs and Symptoms:  -Group instruction provided by written material and verbal discussion to support subject matter. Warning signs and symptoms of infection, stroke, and heart attack are reviewed and when to call the physician/911 reinforced. Tips for preventing the spread of infection discussed.   Advanced Directives:  -Group instruction provided by verbal instruction and written material to support subject matter. Instructor reviews Advanced Directive laws and proper instruction for filling out document.   Pulmonary Video:  -Group video education that reviews the importance of medication and oxygen compliance, exercise, good nutrition, pulmonary hygiene, and pursed lip and diaphragmatic breathing for the pulmonary patient.   Exercise for the Pulmonary Patient:  -Group instruction that is supported by a PowerPoint presentation. Instructor discusses benefits of exercise, core components of exercise, frequency, duration, and intensity of an exercise routine, importance of utilizing pulse oximetry during exercise, safety while exercising, and options of places to exercise outside of rehab.     Pulmonary Medications:  -Verbally interactive group education provided by instructor with focus on inhaled medications and proper administration.   Anatomy and Physiology of the Respiratory System and Intimacy:  -Group instruction provided  by PowerPoint, verbal discussion, and written material to support subject matter. Instructor reviews respiratory cycle and anatomical components  of the respiratory system and their functions. Instructor also reviews differences in obstructive and restrictive respiratory diseases with examples of each. Intimacy, Sex, and Sexuality differences are reviewed with a discussion on how relationships can change when diagnosed with pulmonary disease. Common sexual concerns are reviewed.   MD DAY -A group question and answer session with a medical doctor that allows participants to ask questions that relate to their pulmonary disease state.   OTHER EDUCATION -Group or individual verbal, written, or video instructions that support the educational goals of the pulmonary rehab program.   Knowledge Questionnaire Score:   Core Components/Risk Factors/Patient Goals at Admission:     Personal Goals and Risk Factors at Admission - 11/07/16 1504      Core Components/Risk Factors/Patient Goals on Admission   Improve shortness of breath with ADL's Yes   Intervention Provide education, individualized exercise plan and daily activity instruction to help decrease symptoms of SOB with activities of daily living.   Expected Outcomes Short Term: Achieves a reduction of symptoms when performing activities of daily living.   Develop more efficient breathing techniques such as purse lipped breathing and diaphragmatic breathing; and practicing self-pacing with activity Yes   Intervention Provide education, demonstration and support about specific breathing techniuqes utilized for more efficient breathing. Include techniques such as pursed lipped breathing, diaphragmatic breathing and self-pacing activity.   Expected Outcomes Short Term: Participant will be able to demonstrate and use breathing techniques as needed throughout daily activities.   Increase knowledge of respiratory medications and ability to use respiratory  devices properly  Yes   Intervention Provide education and demonstration as needed of appropriate use of medications, inhalers, and oxygen therapy.   Expected Outcomes Short Term: Achieves understanding of medications use. Understands that oxygen is a medication prescribed by physician. Demonstrates appropriate use of inhaler and oxygen therapy.      Core Components/Risk Factors/Patient Goals Review:    Core Components/Risk Factors/Patient Goals at Discharge (Final Review):    ITP Comments:   Comments:

## 2016-11-13 ENCOUNTER — Encounter (HOSPITAL_COMMUNITY)
Admission: RE | Admit: 2016-11-13 | Discharge: 2016-11-13 | Disposition: A | Discharge status: Home / Self Care | Payer: Medicare Other | Source: Ambulatory Visit | Attending: Cardiology | Admitting: Cardiology

## 2016-11-13 ENCOUNTER — Encounter (HOSPITAL_COMMUNITY): Payer: Self-pay | Admitting: *Deleted

## 2016-11-13 VITALS — Wt 190.9 lb

## 2016-11-13 DIAGNOSIS — Z7901 Long term (current) use of anticoagulants: Secondary | ICD-10-CM | POA: Diagnosis not present

## 2016-11-13 DIAGNOSIS — I5032 Chronic diastolic (congestive) heart failure: Secondary | ICD-10-CM | POA: Diagnosis not present

## 2016-11-13 DIAGNOSIS — I272 Pulmonary hypertension, unspecified: Secondary | ICD-10-CM | POA: Diagnosis not present

## 2016-11-13 DIAGNOSIS — N183 Chronic kidney disease, stage 3 (moderate): Secondary | ICD-10-CM | POA: Diagnosis not present

## 2016-11-13 DIAGNOSIS — Z79899 Other long term (current) drug therapy: Secondary | ICD-10-CM | POA: Diagnosis not present

## 2016-11-13 DIAGNOSIS — I2510 Atherosclerotic heart disease of native coronary artery without angina pectoris: Secondary | ICD-10-CM | POA: Diagnosis not present

## 2016-11-13 NOTE — Progress Notes (Signed)
Daily Session Note  Patient Details  Name: Amy [name] MRN: [id number] Date of Birth: [birth year] Referring Provider:     Pulmonary Rehab Walk Test from 11/11/2016 in [place]  Referring Provider  Dr. Aundra Dubin      Encounter Date: 11/13/2016  Check In:     Session Check In - 11/13/16 1345      Check-In   Location [facility]   Staff Present Su Hilt, MS, ACSM RCEP, Exercise Physiologist;Lisa Ysidro Evert, RN;Portia Rollene Rotunda, RN, BSN   Supervising physician immediately available to respond to emergencies [facility] immediately available   Physician(s) Dr. Clementeen Graham   Medication changes reported     No   Fall or balance concerns reported    No   Tobacco Cessation No Change   Warm-up and Cool-down Performed as group-led instruction   Resistance Training Performed Yes   VAD Patient? No     Pain Assessment   Currently in Pain? No/denies   Multiple Pain Sites No      Capillary Blood Glucose: No results found for this or any previous visit (from the past 24 hour(s)).      Exercise Prescription Changes - 11/13/16 1600      Response to Exercise   Blood Pressure (Admit) 98/54   Blood Pressure (Exercise) 106/60   Blood Pressure (Exit) 102/58   Heart Rate (Admit) 62 bpm   Heart Rate (Exercise) 73 bpm   Heart Rate (Exit) 63 bpm   Oxygen Saturation (Admit) 89 %   Oxygen Saturation (Exercise) 88 %   Oxygen Saturation (Exit) 93 %   Rating of Perceived Exertion (Exercise) 15   Perceived Dyspnea (Exercise) 1   Duration Progress to 45 minutes of aerobic exercise without signs/symptoms of physical distress   Intensity THRR unchanged     Progression   Progression Continue to progress workloads to maintain intensity without signs/symptoms of physical distress.     Resistance Training   Training Prescription Yes   Weight orange bands   Reps 10-15     Oxygen   Oxygen Continuous   Liters --  4-6     NuStep   Level  1   Minutes 17   METs 2     Track   Laps 7   Minutes 17      History  Smoking Status  . Former Smoker  . Packs/day: 0.50  . Years: 44.00  . Types: Cigarettes  . Quit date: 04/07/2009  Smokeless Tobacco  . Never Used    Goals Met:  Exercise tolerated well No report of cardiac concerns or symptoms Strength training completed today  Goals Unmet:  Not Applicable  Comments: Service time is from 1:30p to 3:30p    Dr. Rush Farmer is Medical Director for Pulmonary Rehab at [facility].

## 2016-11-18 ENCOUNTER — Ambulatory Visit: Payer: Self-pay | Admitting: *Deleted

## 2016-11-18 ENCOUNTER — Telehealth: Payer: Self-pay | Admitting: Cardiology

## 2016-11-18 ENCOUNTER — Encounter (HOSPITAL_COMMUNITY)
Admission: RE | Admit: 2016-11-18 | Discharge: 2016-11-18 | Disposition: A | Discharge status: Home / Self Care | Payer: Medicare Other | Source: Ambulatory Visit | Attending: Cardiology | Admitting: Cardiology

## 2016-11-18 VITALS — Wt 190.3 lb

## 2016-11-18 DIAGNOSIS — N183 Chronic kidney disease, stage 3 (moderate): Secondary | ICD-10-CM | POA: Diagnosis not present

## 2016-11-18 DIAGNOSIS — Z7901 Long term (current) use of anticoagulants: Secondary | ICD-10-CM | POA: Diagnosis not present

## 2016-11-18 DIAGNOSIS — I272 Pulmonary hypertension, unspecified: Secondary | ICD-10-CM

## 2016-11-18 DIAGNOSIS — I5032 Chronic diastolic (congestive) heart failure: Secondary | ICD-10-CM | POA: Diagnosis not present

## 2016-11-18 DIAGNOSIS — I2510 Atherosclerotic heart disease of native coronary artery without angina pectoris: Secondary | ICD-10-CM | POA: Diagnosis not present

## 2016-11-18 DIAGNOSIS — Z79899 Other long term (current) drug therapy: Secondary | ICD-10-CM | POA: Diagnosis not present

## 2016-11-18 NOTE — Progress Notes (Signed)
Daily Session Note  Patient Details  Name: Amy [name] MRN: [id number] Date of Birth: [birth year] Referring Provider:     Pulmonary Rehab Walk Test from 11/11/2016 in [place]  Referring Provider  Dr. Aundra Dubin      Encounter Date: 11/18/2016  Check In:     Session Check In - 11/18/16 1517      Check-In   Location [facility]   Staff Present Su Hilt, MS, ACSM RCEP, Exercise Physiologist;Lisa Ysidro Evert, RN;Jackilyn Umphlett Celesta Aver, MS, ACSM CEP, Exercise Physiologist   Supervising physician immediately available to respond to emergencies [facility] immediately available   Physician(s) Dr. Clementeen Graham   Medication changes reported     No   Fall or balance concerns reported    No   Tobacco Cessation No Change   Warm-up and Cool-down Performed as group-led instruction   Resistance Training Performed Yes   VAD Patient? No     Pain Assessment   Currently in Pain? Yes   Pain Location Back   Multiple Pain Sites No      Capillary Blood Glucose: No results found for this or any previous visit (from the past 24 hour(s)).      Exercise Prescription Changes - 11/18/16 1500      Response to Exercise   Blood Pressure (Admit) 110/70   Blood Pressure (Exercise) 132/80   Blood Pressure (Exit) 104/64   Heart Rate (Admit) 54 bpm   Heart Rate (Exercise) 72 bpm   Heart Rate (Exit) 54 bpm   Oxygen Saturation (Admit) 88 %  increased O2 from 3L to 4L SaO2 inc to 90%    Oxygen Saturation (Exercise) 88 %  inc O2 from 4L to 6L, SaO2 inc to 94%   Oxygen Saturation (Exit) 98 %  6L O2   Rating of Perceived Exertion (Exercise) 17   Perceived Dyspnea (Exercise) 3   Duration Progress to 45 minutes of aerobic exercise without signs/symptoms of physical distress   Intensity THRR unchanged     Progression   Progression Continue to progress workloads to maintain intensity without signs/symptoms of physical distress.     Resistance  Training   Training Prescription Yes   Weight orange bands   Reps 10-15   Time 10 Minutes     Oxygen   Oxygen Continuous   Liters --  4-6     Recumbant Bike   Level 1   Minutes 17     NuStep   Level 1   Minutes 17   METs 1.4     Track   Laps 8   Minutes 17      History  Smoking Status  . Former Smoker  . Packs/day: 0.50  . Years: 44.00  . Types: Cigarettes  . Quit date: 04/07/2009  Smokeless Tobacco  . Never Used    Goals Met:  Exercise tolerated well No report of cardiac concerns or symptoms Strength training completed today  Goals Unmet:  Not Applicable  Comments: Service time is from 1330 to 1500   Dr. Rush Farmer is Medical Director for Pulmonary Rehab at [facility].

## 2016-11-18 NOTE — Telephone Encounter (Signed)
New Message     Pt's doctor at pulmonary said to call and report that she had a orange cream milkshake and it made her heart rate go from 126 to112 to 53, then it went back up and continued to spike up high then drop down low for several hours , she did not take the lopressor . Pt she states she did not have any other symptoms but the irregular heart rate.

## 2016-11-18 NOTE — Telephone Encounter (Signed)
Left message to call back.

## 2016-11-19 NOTE — Progress Notes (Signed)
Undergraduate Pulmonary Program (Late entry) At the end of the exercise session yesterday Jose reported that she had a episode of her heart rate increasing after drinking a milk shake on the evening of 11/17/16. She states that it was in the 120-130's and she felt  that it came from her drinking something cold. [place] did not take her PRN Metoprolol.She said the episode lasted 2-3 hours. She said I guess I should have take my medication.During the exercise session 11/18/16 she had a stable heart rate and no complaints with exercise. I instructed to inform Dr. Theodosia Blender office that she had this episode.

## 2016-11-20 ENCOUNTER — Encounter (HOSPITAL_COMMUNITY)
Admission: RE | Admit: 2016-11-20 | Discharge: 2016-11-20 | Disposition: A | Discharge status: Home / Self Care | Payer: Medicare Other | Source: Ambulatory Visit | Attending: Cardiology | Admitting: Cardiology

## 2016-11-20 VITALS — Wt 190.7 lb

## 2016-11-20 DIAGNOSIS — Z7901 Long term (current) use of anticoagulants: Secondary | ICD-10-CM | POA: Diagnosis not present

## 2016-11-20 DIAGNOSIS — I5032 Chronic diastolic (congestive) heart failure: Secondary | ICD-10-CM | POA: Diagnosis not present

## 2016-11-20 DIAGNOSIS — N183 Chronic kidney disease, stage 3 (moderate): Secondary | ICD-10-CM | POA: Diagnosis not present

## 2016-11-20 DIAGNOSIS — I272 Pulmonary hypertension, unspecified: Secondary | ICD-10-CM

## 2016-11-20 DIAGNOSIS — Z79899 Other long term (current) drug therapy: Secondary | ICD-10-CM | POA: Diagnosis not present

## 2016-11-20 DIAGNOSIS — I2510 Atherosclerotic heart disease of native coronary artery without angina pectoris: Secondary | ICD-10-CM | POA: Diagnosis not present

## 2016-11-20 NOTE — Progress Notes (Signed)
Daily Session Note  Patient Details  Name: Amy [name] MRN: [id number] Date of Birth: 06-26-1945 Referring Provider:     Pulmonary Rehab Walk Test from 11/11/2016 in [place]  Referring Provider  Dr. Aundra Dubin      Encounter Date: 11/20/2016  Check In:     Session Check In - 11/20/16 1556      Check-In   Location [facility]   Staff Present Su Hilt, MS, ACSM RCEP, Exercise Physiologist;Portia Rollene Rotunda, RN, Roque Cash, RN   Supervising physician immediately available to respond to emergencies [facility] immediately available   Physician(s) Dr. Wendee Beavers   Medication changes reported     No   Fall or balance concerns reported    No   Tobacco Cessation No Change   Warm-up and Cool-down Performed as group-led instruction   VAD Patient? No     Pain Assessment   Currently in Pain? No/denies   Multiple Pain Sites No      Capillary Blood Glucose: No results found for this or any previous visit (from the past 24 hour(s)).      Exercise Prescription Changes - 11/20/16 1500      Response to Exercise   Blood Pressure (Admit) 130/68   Blood Pressure (Exercise) 118/56   Blood Pressure (Exit) 110/70   Heart Rate (Admit) 61 bpm   Heart Rate (Exercise) 76 bpm   Heart Rate (Exit) 63 bpm   Oxygen Saturation (Admit) 92 %   Oxygen Saturation (Exercise) 87 %  O2 increased to 6L sat up to 89   Oxygen Saturation (Exit) 97 %   Rating of Perceived Exertion (Exercise) 15   Perceived Dyspnea (Exercise) 3   Duration Progress to 45 minutes of aerobic exercise without signs/symptoms of physical distress   Intensity THRR unchanged     Progression   Progression Continue to progress workloads to maintain intensity without signs/symptoms of physical distress.     Resistance Training   Training Prescription Yes   Weight orange bands   Reps 10-15   Time 10 Minutes     Oxygen   Oxygen Continuous   Liters 4-6     Recumbant Bike   Level 1   Minutes 17     NuStep   Level 2   Minutes 17     Track   Laps 7   Minutes 17      History  Smoking Status  . Former Smoker  . Packs/day: 0.50  . Years: 44.00  . Types: Cigarettes  . Quit date: 04/07/2009  Smokeless Tobacco  . Never Used    Goals Met:  Exercise tolerated well No report of cardiac concerns or symptoms Strength training completed today  Goals Unmet:  O2 Sat  Comments: Service time is from 1330 to 1500    Dr. Rush Farmer is Medical Director for Pulmonary Rehab at [facility].

## 2016-11-21 ENCOUNTER — Other Ambulatory Visit: Payer: Self-pay | Admitting: *Deleted

## 2016-11-21 NOTE — Patient Outreach (Signed)
[facility]) Care Management   11/21/2016  Amy [name]  [birth year] [id number]  Amy [name]  is an 71 y.o. female  Subjective:  HF: Pt reports she is doing well with her HF and remains in the GREEN zone with no reported symptoms. States she she has started pulmonary rehab with day 3 this week and will last over the next 12 weeks.  Pt reports her weights on yesterday 186 lbs, today 185 lbs and last week 186 lbs with no reported signs or symptoms. Pt continues to manager her care with no reported issues. Pt reports her home O2 is currently at 3 liters however with pulmonary rehab it is pushed up to 6 liters for more mobility. MEDICATIONS: Pt updates RN on changes with some of her medications that have been updated in EPIC. Pt reported [facility] pharmacy has also assisted with medication issues APPOINTMENTS: Pt reports she has been attending all medical with family and hired help escorting pt to her appointments. Pt has also completed a [employer] application via [employer] worker assisting with a "back-up" if needed for ongoing transportation needs.  Objective:   Review of Systems  All other systems reviewed and are negative.   Physical Exam  Constitutional: She is oriented to person, place, and time. She appears well-developed and well-nourished.  HENT:  Right Ear: External ear normal.  Left Ear: External ear normal.  Eyes: EOM are normal.  Neck: Normal range of motion.  Cardiovascular: Normal heart sounds.   Respiratory: Effort normal and breath sounds normal.  GI: Soft. Bowel sounds are normal.  Musculoskeletal: Normal range of motion.  Neurological: She is alert and oriented to person, place, and time.  Skin: Skin is warm and dry.  Psychiatric: She has a normal mood and affect. Her behavior is normal. Judgment and thought content normal.    Encounter Medications:   Outpatient Encounter Prescriptions as of 11/21/2016  Medication Sig  . acetaminophen (TYLENOL)  325 MG tablet Take 2 tablets (650 mg total) by mouth every 4 (four) hours as needed for headache or mild pain.  Marland Kitchen atorvastatin (LIPITOR) 80 MG tablet Take 1 tablet (80 mg total) by mouth daily with lunch.  . Cholecalciferol (VITAMIN D) 2000 UNITS CAPS Take 2,000 Units by mouth daily.   . colchicine 0.6 MG tablet Take 0.6 mg by mouth daily.   . cyclobenzaprine (FLEXERIL) 5 MG tablet Take 1 tablet (5 mg total) by mouth 3 (three) times daily as needed for muscle spasms.  Marland Kitchen ezetimibe (ZETIA) 10 MG tablet TAKE 1 TABLET (10 MG TOTAL) BY MOUTH IN THE EVENING  . furosemide (LASIX) 20 MG tablet Take 1 tablet (20 mg total) by mouth daily.  . Glycopyrrolate-Formoterol (BEVESPI AEROSPHERE) 9-4.8 MCG/ACT AERO Inhale 1 puff into the lungs 2 (two) times daily.  Marland Kitchen levothyroxine (SYNTHROID, LEVOTHROID) 100 MCG tablet Take 100 mcg by mouth daily before breakfast.  . Macitentan (OPSUMIT) 10 MG TABS Take 1 tablet (10 mg total) by mouth daily.  . metoprolol tartrate (LOPRESSOR) 25 MG tablet Take 0.5 tablets (12.5 mg total) by mouth daily as needed.  . MULTAQ 400 MG tablet TAKE 1 TABLET BY MOUTH TWICE A DAY WITH A MEAL  . Multiple Vitamin (MULTIVITAMIN) capsule Take 1 capsule by mouth daily.  Marland Kitchen NITROSTAT 0.4 MG SL tablet PLACE 1 TABLET (0.4 MG TOTAL) UNDER THE TONGUE EVERY 5 (FIVE) MINUTES AS NEEDED FOR CHEST PAIN.  Marland Kitchen pantoprazole (PROTONIX) 40 MG tablet TAKE 1 TABLET BY MOUTH DAILY  . potassium  chloride SA (K-DUR,KLOR-CON) 20 MEQ tablet Take 0.5 tablets (10 mEq total) by mouth at bedtime.  . Rivaroxaban (XARELTO) 15 MG TABS tablet Take 1 tablet (15 mg total) by mouth daily with supper.  Marland Kitchen UNABLE TO FIND Med Name: CPAP  DME[facility]   No facility-administered encounter medications on file as of 11/21/2016.     Functional Status:   In your present state of health, do you have any difficulty performing the following activities: 10/17/2016 10/13/2016  Hearing? Tempie Donning  Vision? N N  Difficulty concentrating or making decisions?  N N  Walking or climbing stairs? Y Y  Dressing or bathing? N N  Doing errands, shopping? N N  Preparing Food and eating ? N N  Using the Toilet? N N  In the past six months, have you accidently leaked urine? N N  Do you have problems with loss of bowel control? N N  Managing your Medications? N N  Managing your Finances? N N  Housekeeping or managing your Housekeeping? Y Y  Some recent data might be hidden    Fall/Depression Screening:    Fall Risk  11/07/2016 10/17/2016 10/13/2016  Falls in the past year? No No No  Risk for fall due to : - - Impaired mobility   PHQ 2/9 Scores 11/07/2016 10/17/2016 10/13/2016 10/06/2016  PHQ - 2 Score _0 PHQ- 9 Score - - 4 4   Blood pressure 118/62, pulse (!) 54, resp. rate 20, height 1.6 m (_1 ), weight 185 lb (83.9 kg), SpO2 98 %.  Assessment:   Ongoing case management related to HF Follow up with adherence related to medications Follow up with adherence related to medical appointments.  Plan:  Will verified pt's understanding with her HF and verified pt remains in the GREEN zone with no signs or symptoms of HF. Will verify pt has an action plan and continue to do well in managing her HF. Will continue to encouraged adherence with managing her HF, tolerating a salt smart diet and her ongoing daily weights.  Will verify pt has all her medications and administering all medications as recommended by her providers. Will continue to encouraged adherence with attending all her medical appointments to prevent incurred cost of NO SHOW appointments.  Will continue to encouraged adherence with her plan of care and goals that will be reviewed today. No other inquires or request at this time. Will continue ongoing community case management services and schedule next month's home visit according to pt's scheduled appointments.  [name], RN Care Management Coordinator [place] [facility] [phone]

## 2016-11-21 NOTE — Telephone Encounter (Signed)
Left message to call back.

## 2016-11-24 NOTE — Telephone Encounter (Signed)
Patient reports she has had no symptoms since reports episode last week. She has stayed away from cold/icy drinks as she believes that was the culprit. She still has her PRN metoprolol to take but has not had to take it. She understands to call if she has further questions/ concerns / symptoms.

## 2016-11-25 ENCOUNTER — Encounter (HOSPITAL_COMMUNITY): Payer: Medicare Other

## 2016-11-25 ENCOUNTER — Other Ambulatory Visit: Payer: Self-pay | Admitting: Pharmacist

## 2016-11-25 NOTE — Patient Outreach (Signed)
[facility]) Care Management  11/25/2016  Sung Renton  [birth year] [id number]   Called patient to follow up with her. HIPAA identifiers were obtained.  Previous issue with metoprolol has been resolved as the patient reported she discontinued metoprolol and is not having any more issues with her heart rate increasing.   Patient started pulmonary rehab every Tuesday and Thursday afternoon and reported it is going well.  Patient said she was in the middle of trying to secure a tow truck for her daughter who was stranded on [address] with a car issue.  Patient was reminded she can call me at anytime with future medication issues and concern.    Plan:  Since the pharmacy issue has been resolved, patient's pharmacy case will be closed.  Patient is still being managed by [facility] Nurse, [name].  A message will be sent to [facility] alerting her of pharmacy case closure.  Elayne [name], PharmD, [facility] Clinical Pharmacist [phone]

## 2016-11-27 ENCOUNTER — Encounter (HOSPITAL_COMMUNITY)
Admission: RE | Admit: 2016-11-27 | Discharge: 2016-11-27 | Disposition: A | Discharge status: Home / Self Care | Payer: Medicare Other | Source: Ambulatory Visit | Attending: Cardiology | Admitting: Cardiology

## 2016-11-27 VITALS — Wt 190.0 lb

## 2016-11-27 DIAGNOSIS — Z7901 Long term (current) use of anticoagulants: Secondary | ICD-10-CM | POA: Diagnosis not present

## 2016-11-27 DIAGNOSIS — N183 Chronic kidney disease, stage 3 (moderate): Secondary | ICD-10-CM | POA: Diagnosis not present

## 2016-11-27 DIAGNOSIS — I272 Pulmonary hypertension, unspecified: Secondary | ICD-10-CM

## 2016-11-27 DIAGNOSIS — I5032 Chronic diastolic (congestive) heart failure: Secondary | ICD-10-CM | POA: Diagnosis not present

## 2016-11-27 DIAGNOSIS — I2510 Atherosclerotic heart disease of native coronary artery without angina pectoris: Secondary | ICD-10-CM | POA: Diagnosis not present

## 2016-11-27 DIAGNOSIS — Z79899 Other long term (current) drug therapy: Secondary | ICD-10-CM | POA: Diagnosis not present

## 2016-11-27 NOTE — Progress Notes (Signed)
Daily Session Note  Patient Details  Name: Amy [name] MRN: [id number] Date of Birth: 28-Oct-1945 Referring Provider:     Pulmonary Rehab Walk Test from 11/11/2016 in [place]  Referring Provider  Dr. Aundra Dubin      Encounter Date: 11/27/2016  Check In:     Session Check In - 11/27/16 1345      Check-In   Location [facility]   Staff Present Su Hilt, MS, ACSM RCEP, Exercise Physiologist; Ysidro Evert, RN;Portia Rollene Rotunda, RN, BSN   Supervising physician immediately available to respond to emergencies [facility] immediately available   Physician(s) Dr. Clementeen Graham   Medication changes reported     No   Fall or balance concerns reported    No   Tobacco Cessation No Change   Warm-up and Cool-down Performed as group-led instruction   Resistance Training Performed Yes   VAD Patient? No     Pain Assessment   Currently in Pain? No/denies      Capillary Blood Glucose: No results found for this or any previous visit (from the past 24 hour(s)).      Exercise Prescription Changes - 11/27/16 1500      Response to Exercise   Blood Pressure (Admit) 96/54   Blood Pressure (Exercise) 128/70   Blood Pressure (Exit) 106/70   Heart Rate (Admit) 63 bpm   Heart Rate (Exercise) 77 bpm   Heart Rate (Exit) 60 bpm   Oxygen Saturation (Admit) 91 %   Oxygen Saturation (Exercise) 90 %   Oxygen Saturation (Exit) 95 %   Rating of Perceived Exertion (Exercise) 15   Perceived Dyspnea (Exercise) 2   Duration Progress to 45 minutes of aerobic exercise without signs/symptoms of physical distress   Intensity THRR unchanged     Progression   Progression Continue to progress workloads to maintain intensity without signs/symptoms of physical distress.     Resistance Training   Training Prescription Yes   Weight orange bands   Reps 10-15   Time 10 Minutes     Oxygen   Oxygen Continuous   Liters 4-6     Recumbant Bike   Level 2    Minutes 17     Track   Laps 9   Minutes 17      History  Smoking Status  . Former Smoker  . Packs/day: 0.50  . Years: 44.00  . Types: Cigarettes  . Quit date: 04/07/2009  Smokeless Tobacco  . Never Used    Goals Met:  Exercise tolerated well No report of cardiac concerns or symptoms Strength training completed today  Goals Unmet:  Not Applicable  Comments: Service time is from 1330 to 1535    Dr. Rush Farmer is Medical Director for Pulmonary Rehab at [facility].

## 2016-12-02 ENCOUNTER — Encounter (HOSPITAL_COMMUNITY)
Admission: RE | Admit: 2016-12-02 | Discharge: 2016-12-02 | Disposition: A | Discharge status: Home / Self Care | Payer: Medicare Other | Source: Ambulatory Visit | Attending: Cardiology | Admitting: Cardiology

## 2016-12-02 VITALS — Wt 189.6 lb

## 2016-12-02 DIAGNOSIS — I272 Pulmonary hypertension, unspecified: Secondary | ICD-10-CM

## 2016-12-02 DIAGNOSIS — I2510 Atherosclerotic heart disease of native coronary artery without angina pectoris: Secondary | ICD-10-CM | POA: Diagnosis not present

## 2016-12-02 DIAGNOSIS — Z7901 Long term (current) use of anticoagulants: Secondary | ICD-10-CM | POA: Diagnosis not present

## 2016-12-02 DIAGNOSIS — Z79899 Other long term (current) drug therapy: Secondary | ICD-10-CM | POA: Diagnosis not present

## 2016-12-02 DIAGNOSIS — I5032 Chronic diastolic (congestive) heart failure: Secondary | ICD-10-CM | POA: Diagnosis not present

## 2016-12-02 DIAGNOSIS — N183 Chronic kidney disease, stage 3 (moderate): Secondary | ICD-10-CM | POA: Diagnosis not present

## 2016-12-02 NOTE — Progress Notes (Signed)
Daily Session Note  Patient Details  Name: Amy [name] MRN: [id number] Date of Birth: Dec 03, 1945 Referring Provider:     Pulmonary Rehab Walk Test from 11/11/2016 in [place]  Referring Provider  Dr. Aundra Dubin      Encounter Date: 12/02/2016  Check In:     Session Check In - 12/02/16 1330      Check-In   Location [facility]   Staff Present Kathline Magic, MS, ACSM RCEP, Exercise Physiologist;Philip Eckersley Rollene Rotunda, RN, BSN   Supervising physician immediately available to respond to emergencies [facility] immediately available   Physician(s) Dr. Clementeen Graham   Medication changes reported     No   Fall or balance concerns reported    No   Tobacco Cessation No Change   Warm-up and Cool-down Performed as group-led instruction   Resistance Training Performed Yes   VAD Patient? No     Pain Assessment   Currently in Pain? No/denies   Multiple Pain Sites No      Capillary Blood Glucose: No results found for this or any previous visit (from the past 24 hour(s)).      Exercise Prescription Changes - 12/02/16 1545      Response to Exercise   Blood Pressure (Admit) 102/58   Blood Pressure (Exercise) 100/62   Blood Pressure (Exit) 102/62   Heart Rate (Admit) 65 bpm   Heart Rate (Exercise) 73 bpm   Heart Rate (Exit) 64 bpm   Oxygen Saturation (Admit) 94 %   Oxygen Saturation (Exercise) 90 %   Oxygen Saturation (Exit) 91 %   Rating of Perceived Exertion (Exercise) 17   Perceived Dyspnea (Exercise) 2   Duration Progress to 45 minutes of aerobic exercise without signs/symptoms of physical distress   Intensity THRR unchanged     Progression   Progression Continue to progress workloads to maintain intensity without signs/symptoms of physical distress.     Resistance Training   Training Prescription Yes   Weight orange bands   Reps 10-15   Time 10 Minutes     Oxygen   Oxygen Continuous   Liters 4-6     Recumbant  Bike   Level 2   Minutes 17     NuStep   Level 2   Minutes 17   METs 1.4     Track   Laps 7   Minutes 17      History  Smoking Status  . Former Smoker  . Packs/day: 0.50  . Years: 44.00  . Types: Cigarettes  . Quit date: 04/07/2009  Smokeless Tobacco  . Never Used    Goals Met:  Using PLB without cueing & demonstrates good technique Exercise tolerated well No report of cardiac concerns or symptoms Strength training completed today  Goals Unmet:  Not Applicable  Comments: Service time is from 1330 to 1500   Dr. Rush Farmer is Medical Director for Pulmonary Rehab at [facility].

## 2016-12-03 NOTE — Progress Notes (Signed)
Pulmonary Individual Treatment Plan  Patient Details  Name: Amy [name] MRN: [id number] Date of Birth: Nov 22, 1945 Referring Provider:     Pulmonary Rehab Walk Test from 11/11/2016 in [place]  Referring Provider  Dr. Aundra Dubin      Initial Encounter Date:    Pulmonary Rehab Walk Test from 11/11/2016 in [place]  Date  11/11/16  Referring Provider  Dr. Aundra Dubin      Visit Diagnosis: No diagnosis found.  Patient's Home Medications on Admission:   Current Outpatient Prescriptions:  .  acetaminophen (TYLENOL) 325 MG tablet, Take 2 tablets (650 mg total) by mouth every 4 (four) hours as needed for headache or mild pain., Disp: , Rfl:  .  atorvastatin (LIPITOR) 80 MG tablet, Take 1 tablet (80 mg total) by mouth daily with lunch., Disp: 90 tablet, Rfl: 3 .  Cholecalciferol (VITAMIN D) 2000 UNITS CAPS, Take 2,000 Units by mouth daily. , Disp: , Rfl:  .  colchicine 0.6 MG tablet, Take 0.6 mg by mouth daily. , Disp: , Rfl:  .  cyclobenzaprine (FLEXERIL) 5 MG tablet, Take 1 tablet (5 mg total) by mouth 3 (three) times daily as needed for muscle spasms., Disp: 10 tablet, Rfl: 0 .  ezetimibe (ZETIA) 10 MG tablet, TAKE 1 TABLET (10 MG TOTAL) BY MOUTH IN THE EVENING, Disp: 90 tablet, Rfl: 3 .  furosemide (LASIX) 20 MG tablet, Take 1 tablet (20 mg total) by mouth daily., Disp: 30 tablet, Rfl: 6 .  Glycopyrrolate-Formoterol (BEVESPI AEROSPHERE) 9-4.8 MCG/ACT AERO, Inhale 1 puff into the lungs 2 (two) times daily., Disp: , Rfl:  .  levothyroxine (SYNTHROID, LEVOTHROID) 100 MCG tablet, Take 100 mcg by mouth daily before breakfast., Disp: , Rfl:  .  Macitentan (OPSUMIT) 10 MG TABS, Take 1 tablet (10 mg total) by mouth daily., Disp: 30 tablet, Rfl: 11 .  metoprolol tartrate (LOPRESSOR) 25 MG tablet, Take 0.5 tablets (12.5 mg total) by mouth daily as needed., Disp: 15 tablet, Rfl: 11 .  MULTAQ 400 MG tablet, TAKE 1 TABLET BY MOUTH TWICE A  DAY WITH A MEAL, Disp: 180 tablet, Rfl: 3 .  Multiple Vitamin (MULTIVITAMIN) capsule, Take 1 capsule by mouth daily., Disp: , Rfl:  .  NITROSTAT 0.4 MG SL tablet, PLACE 1 TABLET (0.4 MG TOTAL) UNDER THE TONGUE EVERY 5 (FIVE) MINUTES AS NEEDED FOR CHEST PAIN., Disp: 25 tablet, Rfl: 4 .  pantoprazole (PROTONIX) 40 MG tablet, TAKE 1 TABLET BY MOUTH DAILY, Disp: 90 tablet, Rfl: 2 .  potassium chloride SA (K-DUR,KLOR-CON) 20 MEQ tablet, Take 0.5 tablets (10 mEq total) by mouth at bedtime., Disp: , Rfl:  .  Rivaroxaban (XARELTO) 15 MG TABS tablet, Take 1 tablet (15 mg total) by mouth daily with supper., Disp: 30 tablet, Rfl: 11 .  UNABLE TO FIND, Med Name: CPAP  DME-[facility], Disp: , Rfl:   Past Medical History: Past Medical History:  Diagnosis Date  . Apical variant hypertrophic cardiomyopathy ([place])    Diagnosed by echo and ECG 11/13  . Atypical atrial flutter ([place])   . CAD (coronary artery disease)    a. LHC in 10/2011 demonstrated non-obstructive disease and an 80% lesion in a small D1 treated medically.   . CHF (congestive heart failure) ([place])   . Chronic diastolic heart failure ([place])   . CKD (chronic kidney disease), stage III   . COPD (chronic obstructive pulmonary disease) ([place])   . Dizziness    had PT for being  off balance - in approx. 2012  . GERD (gastroesophageal reflux disease)   . Hyperlipidemia   . Hypertension   . Hypothyroid   . Lumbar disc disease   . Macular degeneration   . OSA (obstructive sleep apnea)   . Persistent atrial fibrillation ([place])       . Pulmonary hypertension ([place])    a. [place] 05/2013 showed mild PAH with normal PCWP and RA pressure, could be related to OSA and low oxygen saturation.   . Refusal of blood transfusions as patient is Jehovah's Witness   . Sinus bradycardia     Tobacco Use: History  Smoking Status  . Former Smoker  . Packs/day: 0.50  . Years: 44.00  . Types: Cigarettes  . Quit date: 04/07/2009  Smokeless Tobacco  . Never Used     Labs: Recent Review Flowsheet Data    Labs for ITP Cardiac and Pulmonary Rehab Latest Ref Rng & Units 09/27/2014 10/28/2014 06/25/2016 08/29/2016 09/12/2016   Cholestrol 100 - 199 mg/dL 137 - - 126 105   LDLCALC 0 - 99 mg/dL 72 - - 59 49   LDLDIRECT mg/dL - - - - -   HDL >39 mg/dL 49.50 - - 44 41   Trlycerides 0 - 149 mg/dL 76.0 - - 117 76   Hemoglobin A1c 4.8 - 5.6 % - 6.1(H) - - -   PHART 7.350 - 7.450 - - - - -   PCO2ART 35.0 - 45.0 mmHg - - - - -   HCO3 20.0 - 28.0 mmol/L - - 21.4 - -   TCO2 0 - 100 mmol/L - - 23 - -   ACIDBASEDEF 0.0 - 2.0 mmol/L - - 3.0(H) - -   O2SAT % - - 57.0 - -      Capillary Blood Glucose: No results found for: GLUCAP   Pulmonary Assessment Scores:     Pulmonary Assessment Scores    Row Name 11/11/16 1646 11/13/16 1114       ADL UCSD   ADL Phase Entry Entry    SOB Score total  - 67      CAT Score   CAT Score  - 19  Entry      mMRC Score   mMRC Score 4  -       Pulmonary Function Assessment:     Pulmonary Function Assessment - 11/07/16 1502      Breath   Bilateral Breath Sounds Clear   Shortness of Breath Yes;Limiting activity      Exercise Target Goals:    Exercise Program Goal: Individual exercise prescription set with THRR, safety & activity barriers. Participant demonstrates ability to understand and report RPE using BORG scale, to self-measure pulse accurately, and to acknowledge the importance of the exercise prescription.  Exercise Prescription Goal: Starting with aerobic activity 30 plus minutes a day, 3 days per week for initial exercise prescription. Provide home exercise prescription and guidelines that participant acknowledges understanding prior to discharge.  Activity Barriers & Risk Stratification:   6 Minute Walk:     6 Minute Walk    Row Name 11/11/16 1643         6 Minute Walk   Phase Initial     Distance 730 feet     Walk Time 6 minutes     # of Rest Breaks 0     MPH 1.38     METS 2.07      RPE 13     Perceived Dyspnea  3     Symptoms Yes (comment)     Comments wheelchair     Resting HR 66 bpm     Resting BP 110/73     Max Ex. HR 89 bpm     Max Ex. BP 113/74       Interval HR   Baseline HR (retired) 66     1 Minute HR 72     2 Minute HR 90     3 Minute HR 89     4 Minute HR 87     5 Minute HR 89     6 Minute HR 86     2 Minute Post HR 79     Interval Heart Rate? Yes       Interval Oxygen   Interval Oxygen? Yes     Baseline Oxygen Saturation % 85 %     Resting Liters of Oxygen 3 L     1 Minute Oxygen Saturation % 90 %     1 Minute Liters of Oxygen 4 L     2 Minute Oxygen Saturation % 89 %     2 Minute Liters of Oxygen 4 L     3 Minute Oxygen Saturation % 88 %     3 Minute Liters of Oxygen 4 L     4 Minute Oxygen Saturation % 89 %     4 Minute Liters of Oxygen 4 L     5 Minute Oxygen Saturation % 88 %     5 Minute Liters of Oxygen 4 L     6 Minute Oxygen Saturation % 85 %     6 Minute Liters of Oxygen 4 L     2 Minute Post Oxygen Saturation % 92 %     2 Minute Post Liters of Oxygen 4 L        Oxygen Initial Assessment:     Oxygen Initial Assessment - 11/11/16 1645      Initial 6 min Walk   Oxygen Used Continuous;E-Tanks   Liters per minute 4   Resting Oxygen Saturation  94 %   Exercise Oxygen Saturation  during 6 min walk 85 %     Program Oxygen Prescription   Program Oxygen Prescription Continuous;E-Tanks   Liters per minute --  4-6   Comments needs depending on activity      Oxygen Re-Evaluation:     Oxygen Re-Evaluation    Row Name 12/03/16 2107             Program Oxygen Prescription   Program Oxygen Prescription Continuous;E-Tanks       Comments needs 4 to 6 liters depending on activity         Home Oxygen   Home Oxygen Device Home Concentrator;E-Tanks       Sleep Oxygen Prescription CPAP       Liters per minute 3       Home Exercise Oxygen Prescription Continuous       Liters per minute -  4 to 6       Home at Rest  Exercise Oxygen Prescription None       Liters per minute 3       Compliance with Home Oxygen Use Yes         Goals/Expected Outcomes   Short Term Goals To learn and understand importance of monitoring SPO2 with pulse oximeter and demonstrate accurate use of the pulse oximeter.;To learn and understand importance of maintaining oxygen saturations>88%;To learn and  exhibit compliance with exercise, home and travel O2 prescription;To learn and demonstrate proper pursed lip breathing techniques or other breathing techniques.;To learn and demonstrate proper use of respiratory medications       Long  Term Goals Exhibits compliance with exercise, home and travel O2 prescription;Verbalizes importance of monitoring SPO2 with pulse oximeter and return demonstration;Maintenance of O2 saturations>88%;Exhibits proper breathing techniques, such as pursed lip breathing or other method taught during program session;Compliance with respiratory medication          Oxygen Discharge (Final Oxygen Re-Evaluation):     Oxygen Re-Evaluation - 12/03/16 2107      Program Oxygen Prescription   Program Oxygen Prescription Continuous;E-Tanks   Comments needs 4 to 6 liters depending on activity     Home Oxygen   Home Oxygen Device Home Concentrator;E-Tanks   Sleep Oxygen Prescription CPAP   Liters per minute 3   Home Exercise Oxygen Prescription Continuous   Liters per minute --  4 to 6   Home at Rest Exercise Oxygen Prescription None   Liters per minute 3   Compliance with Home Oxygen Use Yes     Goals/Expected Outcomes   Short Term Goals To learn and understand importance of monitoring SPO2 with pulse oximeter and demonstrate accurate use of the pulse oximeter.;To learn and understand importance of maintaining oxygen saturations>88%;To learn and exhibit compliance with exercise, home and travel O2 prescription;To learn and demonstrate proper pursed lip breathing techniques or other breathing techniques.;To  learn and demonstrate proper use of respiratory medications   Long  Term Goals Exhibits compliance with exercise, home and travel O2 prescription;Verbalizes importance of monitoring SPO2 with pulse oximeter and return demonstration;Maintenance of O2 saturations>88%;Exhibits proper breathing techniques, such as pursed lip breathing or other method taught during program session;Compliance with respiratory medication      Initial Exercise Prescription:     Initial Exercise Prescription - 11/11/16 1600      Date of Initial Exercise RX and Referring Provider   Date 11/11/16   Referring Provider Dr. Aundra Dubin     Oxygen   Oxygen Continuous   Liters --  4-6     Recumbant Bike   Level 1   Minutes 17     NuStep   Level 1   Minutes 17   METs 1.3     Track   Laps 4   Minutes 17     Prescription Details   Frequency (times per week) 2   Duration Progress to 45 minutes of aerobic exercise without signs/symptoms of physical distress     Intensity   THRR 40-80% of Max Heartrate 60-119   Ratings of Perceived Exertion 11-13   Perceived Dyspnea 0-4     Progression   Progression Continue to progress workloads to maintain intensity without signs/symptoms of physical distress.     Resistance Training   Training Prescription Yes   Weight orange bands   Reps 10-15      Perform Capillary Blood Glucose checks as needed.  Exercise Prescription Changes:     Exercise Prescription Changes    Row Name 11/13/16 1600 11/18/16 1500 11/20/16 1500 11/27/16 1500 12/02/16 1545     Response to Exercise   Blood Pressure (Admit) 98/54 110/70 130/68 96/54 102/58   Blood Pressure (Exercise) 106/60 132/80 118/56 128/70 100/62   Blood Pressure (Exit) 102/58 104/64 110/70 106/70 102/62   Heart Rate (Admit) 62 bpm 54 bpm 61 bpm 63 bpm 65 bpm   Heart Rate (Exercise) 73 bpm 72 bpm  76 bpm 77 bpm 73 bpm   Heart Rate (Exit) 63 bpm 54 bpm 63 bpm 60 bpm 64 bpm   Oxygen Saturation (Admit) 89 % 88 %   increased O2 from 3L to 4L SaO2 inc to 90%  92 % 91 % 94 %   Oxygen Saturation (Exercise) 88 % 88 %  inc O2 from 4L to 6L, SaO2 inc to 94% 87 %  O2 increased to 6L sat up to 89 90 % 90 %   Oxygen Saturation (Exit) 93 % 98 %  6L O2 97 % 95 % 91 %   Rating of Perceived Exertion (Exercise) _0 Perceived Dyspnea (Exercise) _1 Duration Progress to 45 minutes of aerobic exercise without signs/symptoms of physical distress Progress to 45 minutes of aerobic exercise without signs/symptoms of physical distress Progress to 45 minutes of aerobic exercise without signs/symptoms of physical distress Progress to 45 minutes of aerobic exercise without signs/symptoms of physical distress Progress to 45 minutes of aerobic exercise without signs/symptoms of physical distress   Intensity _2      Progression   Progression Continue to progress workloads to maintain intensity without signs/symptoms of physical distress. Continue to progress workloads to maintain intensity without signs/symptoms of physical distress. Continue to progress workloads to maintain intensity without signs/symptoms of physical distress. Continue to progress workloads to maintain intensity without signs/symptoms of physical distress. Continue to progress workloads to maintain intensity without signs/symptoms of physical distress.     Resistance Training   Training Prescription _3    Weight _4    Reps 10-15 10-15 10-15 10-15 10-15   Time  - 10 Minutes 10 Minutes 10 Minutes 10 Minutes     Oxygen   Oxygen _5    Liters -  4-6 -  4-6 4-6 4-6 4-6     Recumbant Bike   Level  - _6 Minutes  - _7 NuStep   Level _8 - 2   Minutes _9 - 17   METs 2 1.4  -  - 1.4     Track   Laps _10 Minutes _11 Exercise Comments:   Exercise Goals and Review:     Exercise Goals    Row Name 11/07/16 1447             Exercise Goals   Increase Physical Activity Yes       Intervention Provide advice, education, support and counseling about physical activity/exercise needs.;Develop an individualized exercise prescription for aerobic and resistive training based on initial evaluation findings, risk stratification, comorbidities and participant's personal goals.       Expected Outcomes Achievement of increased cardiorespiratory fitness and enhanced flexibility, muscular endurance and strength shown through measurements of functional capacity and personal statement of participant.       Increase Strength and Stamina Yes       Intervention Provide advice, education, support and counseling about physical activity/exercise needs.;Develop an individualized exercise prescription for aerobic and resistive training based on initial evaluation findings, risk stratification, comorbidities and participant's personal goals.       Expected Outcomes Achievement of increased cardiorespiratory fitness and enhanced  flexibility, muscular endurance and strength shown through measurements of functional capacity and personal statement of participant.          Exercise Goals Re-Evaluation :     Exercise Goals Re-Evaluation    [place] Name 12/02/16 0944             Exercise Goal Re-Evaluation   Exercise Goals Review Increase Strength and Stamina;Increase Physical Activity;Able to understand and use rate of perceived exertion (RPE) scale;Knowledge and understanding of Target Heart Rate Range (THRR);Understanding of Exercise Prescription;Able to understand and use Dyspnea scale       Comments Patient has only attended four exercise sessions. Will cont. to monitor and progress as able.        Expected Outcomes Through exercising at rehab and at home, patient will increase physical strength and  stamina and find ADL's easier to perform.           Discharge Exercise Prescription (Final Exercise Prescription Changes):     Exercise Prescription Changes - 12/02/16 1545      Response to Exercise   Blood Pressure (Admit) 102/58   Blood Pressure (Exercise) 100/62   Blood Pressure (Exit) 102/62   Heart Rate (Admit) 65 bpm   Heart Rate (Exercise) 73 bpm   Heart Rate (Exit) 64 bpm   Oxygen Saturation (Admit) 94 %   Oxygen Saturation (Exercise) 90 %   Oxygen Saturation (Exit) 91 %   Rating of Perceived Exertion (Exercise) 17   Perceived Dyspnea (Exercise) 2   Duration Progress to 45 minutes of aerobic exercise without signs/symptoms of physical distress   Intensity THRR unchanged     Progression   Progression Continue to progress workloads to maintain intensity without signs/symptoms of physical distress.     Resistance Training   Training Prescription Yes   Weight orange bands   Reps 10-15   Time 10 Minutes     Oxygen   Oxygen Continuous   Liters 4-6     Recumbant Bike   Level 2   Minutes 17     NuStep   Level 2   Minutes 17   METs 1.4     Track   Laps 7   Minutes 17      Nutrition:  Target Goals: Understanding of nutrition guidelines, daily intake of sodium <1535m, cholesterol <2054m calories 30% from fat and 7% or less from saturated fats, daily to have 5 or more servings of fruits and vegetables.  Biometrics:     Pre Biometrics - 11/07/16 1543      Pre Biometrics   Grip Strength 23 kg       Nutrition Therapy Plan and Nutrition Goals:   Nutrition Discharge: Rate Your Plate Scores:   Nutrition Goals Re-Evaluation:   Nutrition Goals Discharge (Final Nutrition Goals Re-Evaluation):   Psychosocial: Target Goals: Acknowledge presence or absence of significant depression and/or stress, maximize coping skills, provide positive support system. Participant is able to verbalize types and ability to use techniques and skills needed for reducing  stress and depression.  Initial Review & Psychosocial Screening:     Initial Psych Review & Screening - 11/07/16 1505      Initial Review   Current issues with None Identified     Family Dynamics   Good Support System? Yes   Concerns Recent loss of significant other   Comments lost husband 2 years ago. only married for 2 years     Barriers   Psychosocial barriers to participate in program There are  no identifiable barriers or psychosocial needs.     Screening Interventions   Interventions Encouraged to exercise      Quality of Life Scores:   PHQ-9: Recent Review Flowsheet Data    Depression screen [facility] 2/9 11/07/2016 10/17/2016 10/13/2016 10/06/2016   Decreased Interest _0 Down, Depressed, Hopeless 0 0 1 1   PHQ - 2 Score _1 Altered sleeping - - 1 1   Tired, decreased energy - - 1 1   Change in appetite - - 0 0   Feeling bad or failure about yourself  - - 0 -   Trouble concentrating - - 0 0   Moving slowly or fidgety/restless - - 0 0   Suicidal thoughts - - 0 0   PHQ-9 Score - - 4 4   Difficult doing work/chores - - Not difficult at all -     Interpretation of Total Score  Total Score Depression Severity:  1-4 = Minimal depression, 5-9 = Mild depression, 10-14 = Moderate depression, 15-19 = Moderately severe depression, 20-27 = Severe depression   Psychosocial Evaluation and Intervention:     Psychosocial Evaluation - 11/07/16 1506      Psychosocial Evaluation & Interventions   Interventions Encouraged to exercise with the program and follow exercise prescription   Continue Psychosocial Services  Follow up required by staff      Psychosocial Re-Evaluation:     Psychosocial Re-Evaluation    Row Name 12/03/16 2111             Psychosocial Re-Evaluation   Current issues with None Identified       Comments lost husband 2 years ago and has lost contact with his children which saddens her. she was only married for 2 years.       Expected Outcomes  patient will remain free from psychosocial barriers to participation       Interventions Encouraged to attend Pulmonary Rehabilitation for the exercise       Continue Psychosocial Services  Follow up required by staff          Psychosocial Discharge (Final Psychosocial Re-Evaluation):     Psychosocial Re-Evaluation - 12/03/16 2111      Psychosocial Re-Evaluation   Current issues with None Identified   Comments lost husband 2 years ago and has lost contact with his children which saddens her. she was only married for 2 years.   Expected Outcomes patient will remain free from psychosocial barriers to participation   Interventions Encouraged to attend Pulmonary Rehabilitation for the exercise   Continue Psychosocial Services  Follow up required by staff      Education: Education Goals: Education classes will be provided on a weekly basis, covering required topics. Participant will state understanding/return demonstration of topics presented.  Learning Barriers/Preferences:     Learning Barriers/Preferences - 11/07/16 1501      Learning Barriers/Preferences   Learning Barriers None   Learning Preferences Group Instruction;Individual Instruction;Verbal Instruction;Written Material      Education Topics: Risk Factor Reduction:  -Group instruction that is supported by a PowerPoint presentation. Instructor discusses the definition of a risk factor, different risk factors for pulmonary disease, and how the heart and lungs work together.     Nutrition for Pulmonary Patient:  -Group instruction provided by PowerPoint slides, verbal discussion, and written materials to support subject matter. The instructor gives an explanation and review of healthy diet recommendations, which includes a discussion on weight management,  recommendations for fruit and vegetable consumption, as well as protein, fluid, caffeine, fiber, sodium, sugar, and alcohol. Tips for eating when patients are short of  breath are discussed.   PULMONARY REHAB OTHER RESPIRATORY from 11/27/2016 in [place]  Date  11/27/16  Educator  edna  Instruction Review Code  2- meets goals/outcomes      Pursed Lip Breathing:  -Group instruction that is supported by demonstration and informational handouts. Instructor discusses the benefits of pursed lip and diaphragmatic breathing and detailed demonstration on how to preform both.     Oxygen Safety:  -Group instruction provided by PowerPoint, verbal discussion, and written material to support subject matter. There is an overview of "What is Oxygen" and "Why do we need it".  Instructor also reviews how to create a safe environment for oxygen use, the importance of using oxygen as prescribed, and the risks of noncompliance. There is a brief discussion on traveling with oxygen and resources the patient may utilize.   PULMONARY REHAB OTHER RESPIRATORY from 11/27/2016 in [place]  Date  11/13/16  Educator  Truddie Crumble  Instruction Review Code  2- meets goals/outcomes      Oxygen Equipment:  -Group instruction provided by [employer] Staff utilizing handouts, written materials, and equipment demonstrations.   Signs and Symptoms:  -Group instruction provided by written material and verbal discussion to support subject matter. Warning signs and symptoms of infection, stroke, and heart attack are reviewed and when to call the physician/911 reinforced. Tips for preventing the spread of infection discussed.   Advanced Directives:  -Group instruction provided by verbal instruction and written material to support subject matter. Instructor reviews Advanced Directive laws and proper instruction for filling out document.   Pulmonary Video:  -Group video education that reviews the importance of medication and oxygen compliance, exercise, good nutrition, pulmonary hygiene, and pursed lip and diaphragmatic breathing for  the pulmonary patient.   Exercise for the Pulmonary Patient:  -Group instruction that is supported by a PowerPoint presentation. Instructor discusses benefits of exercise, core components of exercise, frequency, duration, and intensity of an exercise routine, importance of utilizing pulse oximetry during exercise, safety while exercising, and options of places to exercise outside of rehab.     Pulmonary Medications:  -Verbally interactive group education provided by instructor with focus on inhaled medications and proper administration.   Anatomy and Physiology of the Respiratory System and Intimacy:  -Group instruction provided by PowerPoint, verbal discussion, and written material to support subject matter. Instructor reviews respiratory cycle and anatomical components of the respiratory system and their functions. Instructor also reviews differences in obstructive and restrictive respiratory diseases with examples of each. Intimacy, Sex, and Sexuality differences are reviewed with a discussion on how relationships can change when diagnosed with pulmonary disease. Common sexual concerns are reviewed.   MD DAY -A group question and answer session with a medical doctor that allows participants to ask questions that relate to their pulmonary disease state.   OTHER EDUCATION -Group or individual verbal, written, or video instructions that support the educational goals of the pulmonary rehab program.   Knowledge Questionnaire Score:     Knowledge Questionnaire Score - 11/13/16 1114      Knowledge Questionnaire Score   Pre Score 12/13      Core Components/Risk Factors/Patient Goals at Admission:     Personal Goals and Risk Factors at Admission - 11/07/16 1504      Core Components/Risk Factors/Patient Goals on  Admission   Improve shortness of breath with ADL's Yes   Intervention Provide education, individualized exercise plan and daily activity instruction to help decrease symptoms  of SOB with activities of daily living.   Expected Outcomes Short Term: Achieves a reduction of symptoms when performing activities of daily living.   Develop more efficient breathing techniques such as purse lipped breathing and diaphragmatic breathing; and practicing self-pacing with activity Yes   Intervention Provide education, demonstration and support about specific breathing techniuqes utilized for more efficient breathing. Include techniques such as pursed lipped breathing, diaphragmatic breathing and self-pacing activity.   Expected Outcomes Short Term: Participant will be able to demonstrate and use breathing techniques as needed throughout daily activities.   Increase knowledge of respiratory medications and ability to use respiratory devices properly  Yes   Intervention Provide education and demonstration as needed of appropriate use of medications, inhalers, and oxygen therapy.   Expected Outcomes Short Term: Achieves understanding of medications use. Understands that oxygen is a medication prescribed by physician. Demonstrates appropriate use of inhaler and oxygen therapy.      Core Components/Risk Factors/Patient Goals Review:      Goals and Risk Factor Review    Row Name 12/03/16 2109             Core Components/Risk Factors/Patient Goals Review   Personal Goals Review Improve shortness of breath with ADL's;Increase knowledge of respiratory medications and ability to use respiratory devices properly.;Develop more efficient breathing techniques such as purse lipped breathing and diaphragmatic breathing and practicing self-pacing with activity.       Review patient is beginning to use plb technique without prompting. she has not had any improvement in her shortness of breath at this point in rehab however she has missed several sessions.       Expected Outcomes see admission outcomes          Core Components/Risk Factors/Patient Goals at Discharge (Final Review):       Goals and Risk Factor Review - 12/03/16 2109      Core Components/Risk Factors/Patient Goals Review   Personal Goals Review Improve shortness of breath with ADL's;Increase knowledge of respiratory medications and ability to use respiratory devices properly.;Develop more efficient breathing techniques such as purse lipped breathing and diaphragmatic breathing and practicing self-pacing with activity.   Review patient is beginning to use plb technique without prompting. she has not had any improvement in her shortness of breath at this point in rehab however she has missed several sessions.   Expected Outcomes see admission outcomes      ITP Comments:   Comments: ITP REVIEW Pt is making slow  progress toward pulmonary rehab goals after completing 5 sessions. Recommend continued exercise, life style modification, education, and utilization of breathing techniques to increase stamina and strength and decrease shortness of breath with exertion.

## 2016-12-04 ENCOUNTER — Encounter (HOSPITAL_COMMUNITY)
Admission: RE | Admit: 2016-12-04 | Discharge: 2016-12-04 | Disposition: A | Discharge status: Home / Self Care | Payer: Medicare Other | Source: Ambulatory Visit | Attending: Cardiology | Admitting: Cardiology

## 2016-12-04 VITALS — Wt 189.6 lb

## 2016-12-04 DIAGNOSIS — I5032 Chronic diastolic (congestive) heart failure: Secondary | ICD-10-CM | POA: Diagnosis not present

## 2016-12-04 DIAGNOSIS — Z79899 Other long term (current) drug therapy: Secondary | ICD-10-CM | POA: Diagnosis not present

## 2016-12-04 DIAGNOSIS — I272 Pulmonary hypertension, unspecified: Secondary | ICD-10-CM

## 2016-12-04 DIAGNOSIS — N183 Chronic kidney disease, stage 3 (moderate): Secondary | ICD-10-CM | POA: Diagnosis not present

## 2016-12-04 DIAGNOSIS — I2510 Atherosclerotic heart disease of native coronary artery without angina pectoris: Secondary | ICD-10-CM | POA: Diagnosis not present

## 2016-12-04 DIAGNOSIS — Z7901 Long term (current) use of anticoagulants: Secondary | ICD-10-CM | POA: Diagnosis not present

## 2016-12-04 NOTE — Progress Notes (Signed)
Daily Session Note  Patient Details  Name: Amy [name] MRN: [id number] Date of Birth: 07-06-1945 Referring Provider:     Pulmonary Rehab Walk Test from 11/11/2016 in [place]  Referring Provider  Dr. Aundra Dubin      Encounter Date: 12/04/2016  Check In:     Session Check In - 12/04/16 1330      Check-In   Location [facility]   Staff Present Ramon Dredge, RN, MHA;Portia Rollene Rotunda, RN, Roque Cash, RN   Supervising physician immediately available to respond to emergencies [facility] immediately available   Physician(s) Dr. Wendee Beavers   Medication changes reported     No   Fall or balance concerns reported    No   Tobacco Cessation No Change   Warm-up and Cool-down Performed as group-led instruction   Resistance Training Performed Yes   VAD Patient? No     Pain Assessment   Currently in Pain? No/denies   Multiple Pain Sites No      Capillary Blood Glucose: No results found for this or any previous visit (from the past 24 hour(s)).      Exercise Prescription Changes - 12/04/16 1600      Response to Exercise   Blood Pressure (Admit) 82/40  Rechecked BP 112/62   Blood Pressure (Exercise) 130/70   Blood Pressure (Exit) 100/60   Heart Rate (Admit) 57 bpm   Heart Rate (Exercise) 73 bpm   Heart Rate (Exit) 60 bpm   Oxygen Saturation (Admit) 93 %   Oxygen Saturation (Exercise) 95 %   Oxygen Saturation (Exit) 90 %   Rating of Perceived Exertion (Exercise) 12   Perceived Dyspnea (Exercise) 2   Duration Progress to 45 minutes of aerobic exercise without signs/symptoms of physical distress   Intensity THRR unchanged     Progression   Progression Continue to progress workloads to maintain intensity without signs/symptoms of physical distress.     Resistance Training   Training Prescription Yes   Weight orange bands   Reps 10-15   Time 10 Minutes     Oxygen   Oxygen Continuous   Liters 4-6     NuStep    Level 4   Minutes 17   METs 1.7     Track   Laps 8   Minutes 17      History  Smoking Status  . Former Smoker  . Packs/day: 0.50  . Years: 44.00  . Types: Cigarettes  . Quit date: 04/07/2009  Smokeless Tobacco  . Never Used    Goals Met:  Exercise tolerated well No report of cardiac concerns or symptoms Strength training completed today  Goals Unmet:  Not Applicable  Comments: Service time is from 1330 to 1520    Dr. Rush Farmer is Medical Director for Pulmonary Rehab at [facility].

## 2016-12-09 ENCOUNTER — Encounter (HOSPITAL_COMMUNITY): Admission: RE | Admit: 2016-12-09 | Payer: Medicare Other | Source: Ambulatory Visit

## 2016-12-09 ENCOUNTER — Telehealth (HOSPITAL_COMMUNITY): Payer: Self-pay | Admitting: Family Medicine

## 2016-12-11 ENCOUNTER — Encounter (HOSPITAL_COMMUNITY): Payer: Self-pay | Admitting: Cardiology

## 2016-12-11 ENCOUNTER — Ambulatory Visit (HOSPITAL_COMMUNITY)
Admission: RE | Admit: 2016-12-11 | Discharge: 2016-12-11 | Disposition: A | Discharge status: Home / Self Care | Payer: Medicare Other | Source: Ambulatory Visit | Attending: Cardiology | Admitting: Cardiology

## 2016-12-11 ENCOUNTER — Encounter (HOSPITAL_COMMUNITY): Payer: Medicare Other

## 2016-12-11 VITALS — BP 117/51 | HR 61 | Wt 190.0 lb

## 2016-12-11 DIAGNOSIS — M109 Gout, unspecified: Secondary | ICD-10-CM | POA: Insufficient documentation

## 2016-12-11 DIAGNOSIS — I2510 Atherosclerotic heart disease of native coronary artery without angina pectoris: Secondary | ICD-10-CM | POA: Diagnosis not present

## 2016-12-11 DIAGNOSIS — I2721 Secondary pulmonary arterial hypertension: Secondary | ICD-10-CM | POA: Insufficient documentation

## 2016-12-11 DIAGNOSIS — Z87891 Personal history of nicotine dependence: Secondary | ICD-10-CM | POA: Insufficient documentation

## 2016-12-11 DIAGNOSIS — Z79899 Other long term (current) drug therapy: Secondary | ICD-10-CM | POA: Insufficient documentation

## 2016-12-11 DIAGNOSIS — E785 Hyperlipidemia, unspecified: Secondary | ICD-10-CM | POA: Diagnosis not present

## 2016-12-11 DIAGNOSIS — I270 Primary pulmonary hypertension: Secondary | ICD-10-CM

## 2016-12-11 DIAGNOSIS — I480 Paroxysmal atrial fibrillation: Secondary | ICD-10-CM

## 2016-12-11 DIAGNOSIS — I5032 Chronic diastolic (congestive) heart failure: Secondary | ICD-10-CM | POA: Diagnosis not present

## 2016-12-11 DIAGNOSIS — I422 Other hypertrophic cardiomyopathy: Secondary | ICD-10-CM

## 2016-12-11 DIAGNOSIS — Z7901 Long term (current) use of anticoagulants: Secondary | ICD-10-CM | POA: Insufficient documentation

## 2016-12-11 DIAGNOSIS — J449 Chronic obstructive pulmonary disease, unspecified: Secondary | ICD-10-CM | POA: Diagnosis not present

## 2016-12-11 DIAGNOSIS — K219 Gastro-esophageal reflux disease without esophagitis: Secondary | ICD-10-CM | POA: Diagnosis not present

## 2016-12-11 DIAGNOSIS — E039 Hypothyroidism, unspecified: Secondary | ICD-10-CM | POA: Diagnosis not present

## 2016-12-11 DIAGNOSIS — G4733 Obstructive sleep apnea (adult) (pediatric): Secondary | ICD-10-CM | POA: Diagnosis not present

## 2016-12-11 LAB — CBC
HEMATOCRIT: 37.2 % (ref 36.0–46.0)
Hemoglobin: 12.2 g/dL (ref 12.0–15.0)
MCH: 28.6 pg (ref 26.0–34.0)
MCHC: 32.8 g/dL (ref 30.0–36.0)
MCV: 87.1 fL (ref 78.0–100.0)
PLATELETS: 302 10*3/uL (ref 150–400)
RBC: 4.27 MIL/uL (ref 3.87–5.11)
RDW: 16.8 % — AB (ref 11.5–15.5)
WBC: 7.9 10*3/uL (ref 4.0–10.5)

## 2016-12-11 LAB — BRAIN NATRIURETIC PEPTIDE: B Natriuretic Peptide: 148.9 pg/mL — ABNORMAL HIGH (ref 0.0–100.0)

## 2016-12-11 LAB — BASIC METABOLIC PANEL
Anion gap: 9 (ref 5–15)
BUN: 23 mg/dL — AB (ref 6–20)
CALCIUM: 9 mg/dL (ref 8.9–10.3)
CHLORIDE: 104 mmol/L (ref 101–111)
CO2: 22 mmol/L (ref 22–32)
CREATININE: 1.4 mg/dL — AB (ref 0.44–1.00)
GFR calc Af Amer: 43 mL/min — ABNORMAL LOW (ref >60)
GFR calc non Af Amer: 37 mL/min — ABNORMAL LOW (ref >60)
GLUCOSE: 89 mg/dL (ref 65–99)
POTASSIUM: 4 mmol/L (ref 3.5–5.1)
Sodium: 135 mmol/L (ref 135–145)

## 2016-12-11 NOTE — Patient Instructions (Addendum)
Take Colcocine 0.6 mg (1tab), twice a day for 3 days (today, Friday, Saturday)  Then on Sunday go back to 0.6 mg ( 1 tab) daily  Start Uptravi from [facility] drawn today  Your physician recommends that you schedule a follow-up appointment in: 6 weeks

## 2016-12-14 NOTE — Progress Notes (Signed)
PCP: Dr. Radene Ou Cardiology: Dr. Radford Pax HF Cardiology: Dr. Aundra Dubin  [name] was referred by Dr. Radford Pax for evaluation of pulmonary hypertension and CHF.   71 yo with history of COPD, OSA on CPAP, apical hypertrophic cardiomyopathy, chronic diastolic CHF, and paroxysmal atrial fibrillation presents for evaluation of CHF and pulmonary hypertension.  Patient was admitted in 3/18 with CHF and dyspnea, found to have pulmonary arterial hypertension by RHC.  Prior to this, she has had a long history of PAF.  She is currently taking dronedarone but has had breakthrough fibrillation.  She is in NSR today.  She had an ablation in the past and is being evaluated for consideration of redo ablation. She has apical hypertrophic cardiomyopathy proven by cardiac MRI.   She presents today for followup. She is on Opsumit. She was unable to tolerate Adcirca.  She is doing pulmonary rehab now, able to walk 7 laps around the track there.  No dyspnea walking around house.  Still short of breath walking longer distances when she goes out.  She remains in NSR today on dronedarone.  Unable to do 6 minute walk today given suspected gout pain in her foot.  Weight up 2 lbs.  No orthopnea/PND.  No chest pain.  No lightheadedness.   ECG: Personally reviewed, NSR, LVH, deep anterolateral/lateral T wave inversions    Labs (2/18): TSH normal Labs (3/18): K 3.8, creatinine 1.26, BNP 424 Labs (5/18): K 4.4, creatinine 1.96, anti-SCL 70 negative, RF very slightly elevated, HIV negative.  Labs (6/18): K 4, creatinine 1.2  4/18 6 minute walk: 109 m   PMH:  1. COPD: Quit smoking 2011.  - PFTs (3/18): minimal obstruction, minimal restriction, DLCO 22% (severely decreased).  2. Hypothyroidism 3. Hyperlipidemia 4. OSA: Uses CPAP 5. Apical hypertrophic cardiomyopathy:  - Cardiac MRI (7/16) with EF 79%, mid-apical LV hypertrophy with spade-like ventricle, mid-myocardial LGE was noted at the apex.   6. GERD 7. Atrial  fibrillation: Paroxysmal.  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 Long QT interval so dofetilide and sotalol have not been used. She has had atrial fibrillation ablation.  - Currently on dronedarone.  8. Coronary artery disease: LHC (7/16) with nonobstructive disease + 85% ostial stenosis small D1.  9. Diastolic CHF: Echo (7/91) with EF 65-70%, apical hypertrophic cardiomyopathy, severe LAE, PASP 86 mmHg.  - RHC (3/18): mean RA 4, PA 69/23 mean 38, mean PCWP 8, CI 2.17, PVR 7.3 WU Fick, 9.6 WU thermo.  10. Pulmonary hypertension: See RHC above.  - V/Q scan negative 3/18 - HIV, SCL70 negative.  RF borderline elevated, doubt significant.  ANA negative.  - Unable to tolerate Adcirca 11. CPX (12/17): peak VO2 8.5, RER 0.96, VE/VCO2 slope 67. Submaximal but appears to show severe functional impairment.    Social History   Social History  . Marital status: Widowed    Spouse name: divorced.  . Number of children: 1  . Years of education: N/A   Occupational History  . retired. still works for [employer].     Social History Main Topics  . Smoking status: Former Smoker    Packs/day: 0.50    Years: 44.00    Types: Cigarettes    Quit date: 04/07/2009  . Smokeless tobacco: Never Used  . Alcohol use No  . Drug use: No  . Sexual activity: No   Other Topics Concern  . Not on file   Social History Narrative   Pt lives alone with 2 dogs.    Family History  Problem Relation  Age of Onset  . Heart disease Sister        Good Pastures Syndrome  . Gout Father   . Lung cancer Father        smoked  . Breast cancer Paternal Aunt    Review of systems complete and found to be negative unless listed in HPI.    Current Outpatient Prescriptions  Medication Sig Dispense Refill  . acetaminophen (TYLENOL) 325 MG tablet Take 2 tablets (650 mg total) by mouth every 4 (four) hours as needed for headache or mild pain.    Marland Kitchen atorvastatin (LIPITOR) 80 MG tablet Take 1 tablet (80 mg total) by mouth daily with lunch.  90 tablet 3  . Cholecalciferol (VITAMIN D) 2000 UNITS CAPS Take 2,000 Units by mouth daily.     . colchicine 0.6 MG tablet Take 0.6 mg by mouth daily.     . cyclobenzaprine (FLEXERIL) 5 MG tablet Take 1 tablet (5 mg total) by mouth 3 (three) times daily as needed for muscle spasms. 10 tablet 0  . ezetimibe (ZETIA) 10 MG tablet TAKE 1 TABLET (10 MG TOTAL) BY MOUTH IN THE EVENING 90 tablet 3  . furosemide (LASIX) 20 MG tablet Take 1 tablet (20 mg total) by mouth daily. 30 tablet 6  . Glycopyrrolate-Formoterol (BEVESPI AEROSPHERE) 9-4.8 MCG/ACT AERO Inhale 1 puff into the lungs 2 (two) times daily.    Marland Kitchen levothyroxine (SYNTHROID, LEVOTHROID) 100 MCG tablet Take 100 mcg by mouth daily before breakfast.    . Macitentan (OPSUMIT) 10 MG TABS Take 1 tablet (10 mg total) by mouth daily. 30 tablet 11  . metoprolol tartrate (LOPRESSOR) 25 MG tablet Take 0.5 tablets (12.5 mg total) by mouth daily as needed. 15 tablet 11  . MULTAQ 400 MG tablet TAKE 1 TABLET BY MOUTH TWICE A DAY WITH A MEAL 180 tablet 3  . Multiple Vitamin (MULTIVITAMIN) capsule Take 1 capsule by mouth daily.    Marland Kitchen NITROSTAT 0.4 MG SL tablet PLACE 1 TABLET (0.4 MG TOTAL) UNDER THE TONGUE EVERY 5 (FIVE) MINUTES AS NEEDED FOR CHEST PAIN. 25 tablet 4  . pantoprazole (PROTONIX) 40 MG tablet TAKE 1 TABLET BY MOUTH DAILY 90 tablet 2  . potassium chloride SA (K-DUR,KLOR-CON) 20 MEQ tablet Take 0.5 tablets (10 mEq total) by mouth at bedtime.    . Rivaroxaban (XARELTO) 15 MG TABS tablet Take 1 tablet (15 mg total) by mouth daily with supper. 30 tablet 11  . UNABLE TO FIND Med Name: CPAP  DME-[facility]     No current facility-administered medications for this encounter.    BP (!) 117/51   Pulse 61   Wt 190 lb (86.2 kg)   SpO2 91% Comment: on 2.5L of O2  BMI 33.66 kg/m    General: NAD Neck: JVP ,7-8 cm, no thyromegaly or thyroid nodule.  Lungs: Clear to auscultation bilaterally with normal respiratory effort. CV: Nondisplaced PMI.  Heart regular  S1/S2, no S3/S4, 1/6 SEM RUSB.  No peripheral edema.  No carotid bruit.  Normal pedal pulses.  Abdomen: Soft, nontender, no hepatosplenomegaly, no distention.  Skin: Intact without lesions or rashes.  Neurologic: Alert and oriented x 3.  Psych: Normal affect. Extremities: No clubbing or cyanosis.  HEENT: Normal.   Assessment/Plan: 1. Chronic diastolic CHF: Echo with EF 65-70%.  There is also likely a component of RV failure from significant pulmonary hypertension.  NYHA III symptoms.  Volume status stable on exam.   - Continue lasix 20 mg daily and potassium 10 meq daily.  BMET today. 2. Apical hypertrophic cardiomyopathy: Cardiac MRI in 7/16 showed mid-apical LV hypertrophy with vigorous contraction. Now off metoprolol with slow HR.   3. Atrial fibrillation:  Remains in NSR on Multaq.  Per Dr Rayann Heman, would not re-do her ablation.  - Continue Xarelto.  No bleeding.   4. Pulmonary hypertension: RHC in 3/18 showed moderate pulmonary arterial hypertension with elevated PVR (7.3 WU by Fick, 9.6 WU by thermo). She has COPD but I do not think this plays a large role in the pulmonary hypertension.  PFTs in 3/18 showed minimal obstruction and restriction, there was significantly decreased DLCO, this may be due to pulmonary vascular disease.  OSA may play a role but not a large one. Concerned for group 1 PAH.  V/Q scan did not suggest chronic PEs in 3/18.  RF borderline elevated (probably not significant), HIV negative, Anti-SCL70 negative, ANA negative.  - Continue Opsumit 10 mg daily.   - She did not tolerate Adcirca.  I will have her start selexipag.  - defer 6 minute walk to next appointment given gout flare.  - BNP today.  5. Gout: Flare in right foot.  Increase colchicine to 0.6 bid for the next 3-4 days.    [name], MD  12/14/2016

## 2016-12-16 ENCOUNTER — Encounter (HOSPITAL_COMMUNITY)
Admission: RE | Admit: 2016-12-16 | Discharge: 2016-12-16 | Disposition: A | Discharge status: Home / Self Care | Payer: Medicare Other | Source: Ambulatory Visit | Attending: Cardiology | Admitting: Cardiology

## 2016-12-16 VITALS — Wt 186.5 lb

## 2016-12-16 DIAGNOSIS — N183 Chronic kidney disease, stage 3 (moderate): Secondary | ICD-10-CM | POA: Diagnosis not present

## 2016-12-16 DIAGNOSIS — I481 Persistent atrial fibrillation: Secondary | ICD-10-CM | POA: Insufficient documentation

## 2016-12-16 DIAGNOSIS — Z79899 Other long term (current) drug therapy: Secondary | ICD-10-CM | POA: Insufficient documentation

## 2016-12-16 DIAGNOSIS — K219 Gastro-esophageal reflux disease without esophagitis: Secondary | ICD-10-CM | POA: Diagnosis not present

## 2016-12-16 DIAGNOSIS — R001 Bradycardia, unspecified: Secondary | ICD-10-CM | POA: Insufficient documentation

## 2016-12-16 DIAGNOSIS — Z87891 Personal history of nicotine dependence: Secondary | ICD-10-CM | POA: Insufficient documentation

## 2016-12-16 DIAGNOSIS — I2510 Atherosclerotic heart disease of native coronary artery without angina pectoris: Secondary | ICD-10-CM | POA: Insufficient documentation

## 2016-12-16 DIAGNOSIS — H3530 Unspecified macular degeneration: Secondary | ICD-10-CM | POA: Diagnosis not present

## 2016-12-16 DIAGNOSIS — E785 Hyperlipidemia, unspecified: Secondary | ICD-10-CM | POA: Diagnosis not present

## 2016-12-16 DIAGNOSIS — I130 Hypertensive heart and chronic kidney disease with heart failure and stage 1 through stage 4 chronic kidney disease, or unspecified chronic kidney disease: Secondary | ICD-10-CM | POA: Diagnosis not present

## 2016-12-16 DIAGNOSIS — I272 Pulmonary hypertension, unspecified: Secondary | ICD-10-CM | POA: Diagnosis not present

## 2016-12-16 DIAGNOSIS — Z7901 Long term (current) use of anticoagulants: Secondary | ICD-10-CM | POA: Diagnosis not present

## 2016-12-16 DIAGNOSIS — J449 Chronic obstructive pulmonary disease, unspecified: Secondary | ICD-10-CM | POA: Insufficient documentation

## 2016-12-16 DIAGNOSIS — I5032 Chronic diastolic (congestive) heart failure: Secondary | ICD-10-CM | POA: Diagnosis not present

## 2016-12-16 DIAGNOSIS — G4733 Obstructive sleep apnea (adult) (pediatric): Secondary | ICD-10-CM | POA: Diagnosis not present

## 2016-12-16 DIAGNOSIS — M519 Unspecified thoracic, thoracolumbar and lumbosacral intervertebral disc disorder: Secondary | ICD-10-CM | POA: Insufficient documentation

## 2016-12-16 DIAGNOSIS — E039 Hypothyroidism, unspecified: Secondary | ICD-10-CM | POA: Diagnosis not present

## 2016-12-16 NOTE — Progress Notes (Signed)
Daily Session Note  Patient Details  Name: Amy [name] MRN: [id number] Date of Birth: [birth year] Referring Provider:     Pulmonary Rehab Walk Test from 11/11/2016 in [place]  Referring Provider  Dr. Aundra Dubin      Encounter Date: 12/16/2016  Check In:     Session Check In - 12/16/16 1330      Check-In   Location [facility]   Staff Present Trish Fountain, RN, BSN;Ramon Dredge, RN, MHA;Molly diVincenzo, MS, ACSM RCEP, Exercise Physiologist   Supervising physician immediately available to respond to emergencies [facility] immediately available   Physician(s) Dr. Bonner Puna   Medication changes reported     No   Fall or balance concerns reported    No   Tobacco Cessation No Change   Warm-up and Cool-down Performed as group-led instruction   Resistance Training Performed Yes   VAD Patient? No     Pain Assessment   Currently in Pain? No/denies   Multiple Pain Sites No      Capillary Blood Glucose: No results found for this or any previous visit (from the past 24 hour(s)).      Exercise Prescription Changes - 12/16/16 1545      Response to Exercise   Blood Pressure (Admit) 94/64   Blood Pressure (Exercise) 120/60   Blood Pressure (Exit) 104/70   Heart Rate (Admit) 60 bpm   Heart Rate (Exercise) 74 bpm   Heart Rate (Exit) 61 bpm   Oxygen Saturation (Admit) 92 %   Oxygen Saturation (Exercise) 95 %   Oxygen Saturation (Exit) 91 %   Rating of Perceived Exertion (Exercise) 12   Perceived Dyspnea (Exercise) 2   Duration Progress to 45 minutes of aerobic exercise without signs/symptoms of physical distress   Intensity THRR unchanged     Progression   Progression Continue to progress workloads to maintain intensity without signs/symptoms of physical distress.     Resistance Training   Training Prescription Yes   Weight orange bands   Reps 10-15   Time 10 Minutes     Oxygen   Oxygen Continuous   Liters  4-6     Recumbant Bike   Level 4   Minutes 17     NuStep   Level 2   Minutes 17   METs 2      History  Smoking Status  . Former Smoker  . Packs/day: 0.50  . Years: 44.00  . Types: Cigarettes  . Quit date: 04/07/2009  Smokeless Tobacco  . Never Used    Goals Met:  Using PLB without cueing & demonstrates good technique Exercise tolerated well No report of cardiac concerns or symptoms Strength training completed today  Goals Unmet:  Not Applicable  Comments: Service time is from 1330 to 1530   Dr. Rush Farmer is Medical Director for Pulmonary Rehab at [facility].

## 2016-12-17 ENCOUNTER — Telehealth (HOSPITAL_COMMUNITY): Payer: Self-pay | Admitting: Pharmacist

## 2016-12-17 NOTE — Telephone Encounter (Signed)
Malvin Johns PA approved by [employer] through 12/17/19.   Ruta Hinds. Velva Harman, PharmD, BCPS, CPP Clinical Pharmacist Pager: [phone] Phone: [phone] 12/17/2016 12:34 PM

## 2016-12-18 ENCOUNTER — Encounter (HOSPITAL_COMMUNITY)
Admission: RE | Admit: 2016-12-18 | Discharge: 2016-12-18 | Disposition: A | Discharge status: Home / Self Care | Payer: Medicare Other | Source: Ambulatory Visit | Attending: Cardiology | Admitting: Cardiology

## 2016-12-18 VITALS — Wt 187.6 lb

## 2016-12-18 DIAGNOSIS — Z79899 Other long term (current) drug therapy: Secondary | ICD-10-CM | POA: Diagnosis not present

## 2016-12-18 DIAGNOSIS — I272 Pulmonary hypertension, unspecified: Secondary | ICD-10-CM

## 2016-12-18 DIAGNOSIS — I5032 Chronic diastolic (congestive) heart failure: Secondary | ICD-10-CM | POA: Diagnosis not present

## 2016-12-18 DIAGNOSIS — N183 Chronic kidney disease, stage 3 (moderate): Secondary | ICD-10-CM | POA: Diagnosis not present

## 2016-12-18 DIAGNOSIS — I2510 Atherosclerotic heart disease of native coronary artery without angina pectoris: Secondary | ICD-10-CM | POA: Diagnosis not present

## 2016-12-18 DIAGNOSIS — Z7901 Long term (current) use of anticoagulants: Secondary | ICD-10-CM | POA: Diagnosis not present

## 2016-12-18 NOTE — Progress Notes (Signed)
I have reviewed a Home Exercise Prescription with Amy [name] . [name] is not currently exercising at home.  The patient was advised to walk 2-3 days a week for 30 minutes.  [name] and I discussed how to progress their exercise prescription.  The patient stated that their goals were to lose weight, be able to make her bed without shortness of breath, and increase overall physical activity.  The patient stated that they understand the exercise prescription.  We reviewed exercise guidelines, target heart rate during exercise, oxygen use, weather, home pulse oximeter, endpoints for exercise, and goals.  Patient is encouraged to come to me with any questions. I will continue to follow up with the patient to assist them with progression and safety.

## 2016-12-18 NOTE — Progress Notes (Signed)
Daily Session Note  Patient Details  Name: Amy [name]  MRN: [id number] Date of Birth: 08/02/1945 Referring Provider:     Pulmonary Rehab Walk Test from 11/11/2016 in [facility]  Referring Provider  Dr. Mclean      Encounter Date: 12/18/2016  Check In:     Session Check In - 12/18/16 1527      Check-In   Location [facility]   Staff Present Molly diVincenzo, MS, ACSM RCEP, Exercise Physiologist; , RN, BSN   Supervising physician immediately available to respond to emergencies [facility] immediately available   Physician(s) Dr. Bhandari   Medication changes reported     No   Fall or balance concerns reported    No   Tobacco Cessation No Change   Warm-up and Cool-down Performed as group-led instruction   Resistance Training Performed Yes   VAD Patient? No     Pain Assessment   Currently in Pain? No/denies   Multiple Pain Sites No      Capillary Blood Glucose: No results found for this or any previous visit (from the past 24 hour(s)).      Exercise Prescription Changes - 12/18/16 1527      Response to Exercise   Blood Pressure (Admit) 104/62   Blood Pressure (Exercise) 112/60   Blood Pressure (Exit) 108/58   Heart Rate (Admit) 63 bpm   Heart Rate (Exercise) 77 bpm   Heart Rate (Exit) 61 bpm   Oxygen Saturation (Admit) 92 %   Oxygen Saturation (Exercise) 90 %   Oxygen Saturation (Exit) 94 %   Rating of Perceived Exertion (Exercise) 11   Perceived Dyspnea (Exercise) 3   Duration Progress to 45 minutes of aerobic exercise without signs/symptoms of physical distress   Intensity THRR unchanged     Progression   Progression Continue to progress workloads to maintain intensity without signs/symptoms of physical distress.     Resistance Training   Training Prescription Yes   Weight orange bands   Reps 10-15   Time 10 Minutes     Oxygen   Oxygen Continuous   Liters 4-6     Recumbant Bike    Level 4   Minutes 17     NuStep   Level 4   Minutes 17   METs 1.7     Track   Laps 6   Minutes 17      History  Smoking Status  . Former Smoker  . Packs/day: 0.50  . Years: 44.00  . Types: Cigarettes  . Quit date: 04/07/2009  Smokeless Tobacco  . Never Used    Goals Met:  Exercise tolerated well Queuing for purse lip breathing No report of cardiac concerns or symptoms Strength training completed today  Goals Unmet:  Not Applicable  Comments: Service time is from 1330 to 1450   Dr. Wesam G. Yacoub is Medical Director for Pulmonary Rehab at  Hospital. 

## 2016-12-22 NOTE — Progress Notes (Signed)
Amy [name] 71 y.o. female   DOB: 08-12-1945 MRN: [id number]          Nutrition Note 1. Pulmonary hypertension ([place])    Past Medical History:  Diagnosis Date  . Apical variant hypertrophic cardiomyopathy ([place])    Diagnosed by echo and ECG 11/13  . Atypical atrial flutter ([place])   . CAD (coronary artery disease)    a. LHC in 10/2011 demonstrated non-obstructive disease and an 80% lesion in a small D1 treated medically.   . CHF (congestive heart failure) ([place])   . Chronic diastolic heart failure ([place])   . CKD (chronic kidney disease), stage III   . COPD (chronic obstructive pulmonary disease) ([place])   . Dizziness    had PT for being off balance - in approx. 2012  . GERD (gastroesophageal reflux disease)   . Hyperlipidemia   . Hypertension   . Hypothyroid   . Lumbar disc disease   . Macular degeneration   . OSA (obstructive sleep apnea)   . Persistent atrial fibrillation ([place])       . Pulmonary hypertension ([place])    a. [place] 05/2013 showed mild PAH with normal PCWP and RA pressure, could be related to OSA and low oxygen saturation.   . Refusal of blood transfusions as patient is Jehovah's Witness   . Sinus bradycardia    Meds reviewed. Ht: Ht Readings from Last 1 Encounters:  11/21/16 _0  (1.6 m)    Wt:  Wt Readings from Last 3 Encounters:  12/18/16 187 lb 9.8 oz (85.1 kg)  12/16/16 186 lb 8.2 oz (84.6 kg)  12/11/16 190 lb (86.2 kg)    BMI: 33.7    Current tobacco use? No  Labs:  Lipid Panel     Component Value Date/Time   CHOL 105 09/12/2016 1204   TRIG 76 09/12/2016 1204   HDL 41 09/12/2016 1204   CHOLHDL 2.6 09/12/2016 1204   CHOLHDL 3 09/27/2014 0946   VLDL 15.2 09/27/2014 0946   LDLCALC 49 09/12/2016 1204   LDLDIRECT 115.0 06/09/2014 1449    Lab Results  Component Value Date   HGBA1C 6.1 (H) 10/28/2014    Nutrition Note Spoke with pt. Pt is obese. There are some ways the pt can make her eating habits healthier. Pt's A1c from 2 years ago noted.   Nutrition Diagnosis ? Food-and nutrition-related knowledge deficit related to lack of exposure to information as related to diagnosis of pulmonary disease ? Obesity related to excessive energy intake as evidenced by a BMI of 33.7  Goal(s) 1. Identify food quantities necessary to achieve wt loss of  -2# per week to a goal wt loss of 2.7-10.9 kg (6-24 lb) at graduation from pulmonary rehab. Plan:  Pt to attend Pulmonary Nutrition class Will provide client-centered nutrition education as part of interdisciplinary care.   Monitor and evaluate progress toward nutrition goal with team.  Monitor and Evaluate progress toward nutrition goal with team.   Derek Mound, M.Ed, RD, LDN, CDE 12/22/2016 10:31 AM

## 2016-12-23 ENCOUNTER — Encounter (HOSPITAL_COMMUNITY): Payer: Medicare Other

## 2016-12-23 DIAGNOSIS — Z0000 Encounter for general adult medical examination without abnormal findings: Secondary | ICD-10-CM | POA: Diagnosis not present

## 2016-12-23 DIAGNOSIS — Z23 Encounter for immunization: Secondary | ICD-10-CM | POA: Diagnosis not present

## 2016-12-23 DIAGNOSIS — Z7901 Long term (current) use of anticoagulants: Secondary | ICD-10-CM | POA: Diagnosis not present

## 2016-12-23 DIAGNOSIS — I272 Pulmonary hypertension, unspecified: Secondary | ICD-10-CM | POA: Diagnosis not present

## 2016-12-23 DIAGNOSIS — G4730 Sleep apnea, unspecified: Secondary | ICD-10-CM | POA: Diagnosis not present

## 2016-12-23 DIAGNOSIS — I480 Paroxysmal atrial fibrillation: Secondary | ICD-10-CM | POA: Diagnosis not present

## 2016-12-23 DIAGNOSIS — E7800 Pure hypercholesterolemia, unspecified: Secondary | ICD-10-CM | POA: Diagnosis not present

## 2016-12-23 DIAGNOSIS — I5030 Unspecified diastolic (congestive) heart failure: Secondary | ICD-10-CM | POA: Diagnosis not present

## 2016-12-23 DIAGNOSIS — E039 Hypothyroidism, unspecified: Secondary | ICD-10-CM | POA: Diagnosis not present

## 2016-12-25 ENCOUNTER — Encounter (HOSPITAL_COMMUNITY)
Admission: RE | Admit: 2016-12-25 | Discharge: 2016-12-25 | Disposition: A | Discharge status: Home / Self Care | Payer: Medicare Other | Source: Ambulatory Visit | Attending: Cardiology | Admitting: Cardiology

## 2016-12-25 VITALS — Wt 190.7 lb

## 2016-12-25 DIAGNOSIS — I2510 Atherosclerotic heart disease of native coronary artery without angina pectoris: Secondary | ICD-10-CM | POA: Diagnosis not present

## 2016-12-25 DIAGNOSIS — I272 Pulmonary hypertension, unspecified: Secondary | ICD-10-CM

## 2016-12-25 DIAGNOSIS — Z7901 Long term (current) use of anticoagulants: Secondary | ICD-10-CM | POA: Diagnosis not present

## 2016-12-25 DIAGNOSIS — N183 Chronic kidney disease, stage 3 (moderate): Secondary | ICD-10-CM | POA: Diagnosis not present

## 2016-12-25 DIAGNOSIS — Z79899 Other long term (current) drug therapy: Secondary | ICD-10-CM | POA: Diagnosis not present

## 2016-12-25 DIAGNOSIS — I5032 Chronic diastolic (congestive) heart failure: Secondary | ICD-10-CM | POA: Diagnosis not present

## 2016-12-25 NOTE — Progress Notes (Signed)
Daily Session Note  Patient Details  Name: Amy [name] MRN: [id number] Date of Birth: Aug 06, 1945 Referring Provider:     Pulmonary Rehab Walk Test from 11/11/2016 in [place]  Referring Provider  Dr. Aundra Dubin      Encounter Date: 12/25/2016  Check In:     Session Check In - 12/25/16 1351      Check-In   Location [facility]   Staff Present Su Hilt, MS, ACSM RCEP, Exercise Physiologist;Portia Rollene Rotunda, RN, BSN   Supervising physician immediately available to respond to emergencies [facility] immediately available   Physician(s) Dr. Wynelle Cleveland   Medication changes reported     No   Fall or balance concerns reported    No   Tobacco Cessation No Change   Warm-up and Cool-down Performed as group-led instruction   Resistance Training Performed Yes   VAD Patient? No     Pain Assessment   Currently in Pain? No/denies   Multiple Pain Sites No      Capillary Blood Glucose: No results found for this or any previous visit (from the past 24 hour(s)).      Exercise Prescription Changes - 12/25/16 1600      Response to Exercise   Blood Pressure (Admit) 92/54   Blood Pressure (Exercise) 118/62   Blood Pressure (Exit) 90/60   Heart Rate (Admit) 73 bpm   Heart Rate (Exercise) 82 bpm   Heart Rate (Exit) 77 bpm   Oxygen Saturation (Admit) 97 %   Oxygen Saturation (Exercise) 92 %   Oxygen Saturation (Exit) 95 %   Rating of Perceived Exertion (Exercise) 12   Perceived Dyspnea (Exercise) 2   Duration Progress to 45 minutes of aerobic exercise without signs/symptoms of physical distress   Intensity THRR unchanged     Progression   Progression Continue to progress workloads to maintain intensity without signs/symptoms of physical distress.     Resistance Training   Training Prescription Yes   Weight orange bands   Reps 10-15   Time 10 Minutes     Oxygen   Oxygen Continuous   Liters 4-6     NuStep   Level 4    Minutes 17   METs 1.7     Track   Laps 9   Minutes 17      History  Smoking Status  . Former Smoker  . Packs/day: 0.50  . Years: 44.00  . Types: Cigarettes  . Quit date: 04/07/2009  Smokeless Tobacco  . Never Used    Goals Met:  Exercise tolerated well No report of cardiac concerns or symptoms Strength training completed today  Goals Unmet:  Not Applicable  Comments: Service time is from 1:30p to 3:30p    Dr. Rush Farmer is Medical Director for Pulmonary Rehab at [facility].

## 2016-12-26 ENCOUNTER — Other Ambulatory Visit: Payer: Self-pay | Admitting: *Deleted

## 2016-12-26 NOTE — Patient Outreach (Signed)
[facility]) Care Management  12/26/2016  Elene Downum  02-Sep-1945 [id number]   Pt returned a call to pt concerning the missed visit today via voice message. RN will follow up next week to reschedule due to the hour of day. No major issues mentioned on the voice message left today.  [name], RN Care Management Coordinator [place] Office [phone]

## 2016-12-26 NOTE — Patient Outreach (Signed)
[facility] Care Management  12/26/2016  Amy [name] 01/30/1946 [id number]   NO SHOW on this scheduled routine home visit as pt was not available. RN left contact information on the door and called the pt however unable to leave a message. Will follow up next week with a call to reschedule if pt does not call RN back for rescheduling.   [name], RN Care Management Coordinator [place] Office [phone]

## 2016-12-26 NOTE — Progress Notes (Signed)
Pulmonary Individual Treatment Plan  Patient Details  Name: Amy [name] MRN: [id number] Date of Birth: 1945-06-29 Referring Provider:     Pulmonary Rehab Walk Test from 11/11/2016 in [place]  Referring Provider  Dr. Aundra Dubin      Initial Encounter Date:    Pulmonary Rehab Walk Test from 11/11/2016 in [place]  Date  11/11/16  Referring Provider  Dr. Aundra Dubin      Visit Diagnosis: Pulmonary hypertension ([place])  Patient's Home Medications on Admission:   Current Outpatient Prescriptions:  .  acetaminophen (TYLENOL) 325 MG tablet, Take 2 tablets (650 mg total) by mouth every 4 (four) hours as needed for headache or mild pain., Disp: , Rfl:  .  atorvastatin (LIPITOR) 80 MG tablet, Take 1 tablet (80 mg total) by mouth daily with lunch., Disp: 90 tablet, Rfl: 3 .  Cholecalciferol (VITAMIN D) 2000 UNITS CAPS, Take 2,000 Units by mouth daily. , Disp: , Rfl:  .  colchicine 0.6 MG tablet, Take 0.6 mg by mouth daily. , Disp: , Rfl:  .  cyclobenzaprine (FLEXERIL) 5 MG tablet, Take 1 tablet (5 mg total) by mouth 3 (three) times daily as needed for muscle spasms., Disp: 10 tablet, Rfl: 0 .  ezetimibe (ZETIA) 10 MG tablet, TAKE 1 TABLET (10 MG TOTAL) BY MOUTH IN THE EVENING, Disp: 90 tablet, Rfl: 3 .  furosemide (LASIX) 20 MG tablet, Take 1 tablet (20 mg total) by mouth daily., Disp: 30 tablet, Rfl: 6 .  Glycopyrrolate-Formoterol (BEVESPI AEROSPHERE) 9-4.8 MCG/ACT AERO, Inhale 1 puff into the lungs 2 (two) times daily., Disp: , Rfl:  .  levothyroxine (SYNTHROID, LEVOTHROID) 100 MCG tablet, Take 100 mcg by mouth daily before breakfast., Disp: , Rfl:  .  Macitentan (OPSUMIT) 10 MG TABS, Take 1 tablet (10 mg total) by mouth daily., Disp: 30 tablet, Rfl: 11 .  metoprolol tartrate (LOPRESSOR) 25 MG tablet, Take 0.5 tablets (12.5 mg total) by mouth daily as needed., Disp: 15 tablet, Rfl: 11 .  MULTAQ 400 MG tablet, TAKE 1 TABLET BY  MOUTH TWICE A DAY WITH A MEAL, Disp: 180 tablet, Rfl: 3 .  Multiple Vitamin (MULTIVITAMIN) capsule, Take 1 capsule by mouth daily., Disp: , Rfl:  .  NITROSTAT 0.4 MG SL tablet, PLACE 1 TABLET (0.4 MG TOTAL) UNDER THE TONGUE EVERY 5 (FIVE) MINUTES AS NEEDED FOR CHEST PAIN., Disp: 25 tablet, Rfl: 4 .  pantoprazole (PROTONIX) 40 MG tablet, TAKE 1 TABLET BY MOUTH DAILY, Disp: 90 tablet, Rfl: 2 .  potassium chloride SA (K-DUR,KLOR-CON) 20 MEQ tablet, Take 0.5 tablets (10 mEq total) by mouth at bedtime., Disp: , Rfl:  .  Rivaroxaban (XARELTO) 15 MG TABS tablet, Take 1 tablet (15 mg total) by mouth daily with supper., Disp: 30 tablet, Rfl: 11 .  Selexipag (UPTRAVI) 200 MCG TABS, Take 200 mcg by mouth 2 (two) times daily., Disp: , Rfl:  .  UNABLE TO FIND, Med Name: CPAP  DME-[facility], Disp: , Rfl:   Past Medical History: Past Medical History:  Diagnosis Date  . Apical variant hypertrophic cardiomyopathy ([place])    Diagnosed by echo and ECG 11/13  . Atypical atrial flutter ([place])   . CAD (coronary artery disease)    a. LHC in 10/2011 demonstrated non-obstructive disease and an 80% lesion in a small D1 treated medically.   . CHF (congestive heart failure) ([place])   . Chronic diastolic heart failure ([place])   . CKD (chronic kidney disease), stage III   .  COPD (chronic obstructive pulmonary disease) ([place])   . Dizziness    had PT for being off balance - in approx. 2012  . GERD (gastroesophageal reflux disease)   . Hyperlipidemia   . Hypertension   . Hypothyroid   . Lumbar disc disease   . Macular degeneration   . OSA (obstructive sleep apnea)   . Persistent atrial fibrillation ([place])       . Pulmonary hypertension ([place])    a. [place] 05/2013 showed mild PAH with normal PCWP and RA pressure, could be related to OSA and low oxygen saturation.   . Refusal of blood transfusions as patient is Jehovah's Witness   . Sinus bradycardia     Tobacco Use: History  Smoking Status  . Former Smoker  . Packs/day: 0.50  .  Years: 44.00  . Types: Cigarettes  . Quit date: 04/07/2009  Smokeless Tobacco  . Never Used    Labs: Recent Review Flowsheet Data    Labs for ITP Cardiac and Pulmonary Rehab Latest Ref Rng & Units 09/27/2014 10/28/2014 06/25/2016 08/29/2016 09/12/2016   Cholestrol 100 - 199 mg/dL 137 - - 126 105   LDLCALC 0 - 99 mg/dL 72 - - 59 49   LDLDIRECT mg/dL - - - - -   HDL >39 mg/dL 49.50 - - 44 41   Trlycerides 0 - 149 mg/dL 76.0 - - 117 76   Hemoglobin A1c 4.8 - 5.6 % - 6.1(H) - - -   PHART 7.350 - 7.450 - - - - -   PCO2ART 35.0 - 45.0 mmHg - - - - -   HCO3 20.0 - 28.0 mmol/L - - 21.4 - -   TCO2 0 - 100 mmol/L - - 23 - -   ACIDBASEDEF 0.0 - 2.0 mmol/L - - 3.0(H) - -   O2SAT % - - 57.0 - -      Capillary Blood Glucose: No results found for: GLUCAP   Pulmonary Assessment Scores:     Pulmonary Assessment Scores    Row Name 11/11/16 1646 11/13/16 1114       ADL UCSD   ADL Phase Entry Entry    SOB Score total  - 67      CAT Score   CAT Score  - 19  Entry      mMRC Score   mMRC Score 4  -       Pulmonary Function Assessment:     Pulmonary Function Assessment - 11/07/16 1502      Breath   Bilateral Breath Sounds Clear   Shortness of Breath Yes;Limiting activity      Exercise Target Goals:    Exercise Program Goal: Individual exercise prescription set with THRR, safety & activity barriers. Participant demonstrates ability to understand and report RPE using BORG scale, to self-measure pulse accurately, and to acknowledge the importance of the exercise prescription.  Exercise Prescription Goal: Starting with aerobic activity 30 plus minutes a day, 3 days per week for initial exercise prescription. Provide home exercise prescription and guidelines that participant acknowledges understanding prior to discharge.  Activity Barriers & Risk Stratification:   6 Minute Walk:     6 Minute Walk    Row Name 11/11/16 1643         6 Minute Walk   Phase Initial     Distance  730 feet     Walk Time 6 minutes     # of Rest Breaks 0     MPH 1.38  METS 2.07     RPE 13     Perceived Dyspnea  3     Symptoms Yes (comment)     Comments wheelchair     Resting HR 66 bpm     Resting BP 110/73     Max Ex. HR 89 bpm     Max Ex. BP 113/74       Interval HR   Baseline HR (retired) 66     1 Minute HR 72     2 Minute HR 90     3 Minute HR 89     4 Minute HR 87     5 Minute HR 89     6 Minute HR 86     2 Minute Post HR 79     Interval Heart Rate? Yes       Interval Oxygen   Interval Oxygen? Yes     Baseline Oxygen Saturation % 85 %     Resting Liters of Oxygen 3 L     1 Minute Oxygen Saturation % 90 %     1 Minute Liters of Oxygen 4 L     2 Minute Oxygen Saturation % 89 %     2 Minute Liters of Oxygen 4 L     3 Minute Oxygen Saturation % 88 %     3 Minute Liters of Oxygen 4 L     4 Minute Oxygen Saturation % 89 %     4 Minute Liters of Oxygen 4 L     5 Minute Oxygen Saturation % 88 %     5 Minute Liters of Oxygen 4 L     6 Minute Oxygen Saturation % 85 %     6 Minute Liters of Oxygen 4 L     2 Minute Post Oxygen Saturation % 92 %     2 Minute Post Liters of Oxygen 4 L        Oxygen Initial Assessment:     Oxygen Initial Assessment - 11/11/16 1645      Initial 6 min Walk   Oxygen Used Continuous;E-Tanks   Liters per minute 4   Resting Oxygen Saturation  94 %   Exercise Oxygen Saturation  during 6 min walk 85 %     Program Oxygen Prescription   Program Oxygen Prescription Continuous;E-Tanks   Liters per minute --  4-6   Comments needs depending on activity      Oxygen Re-Evaluation:     Oxygen Re-Evaluation    Row Name 12/03/16 2107 12/26/16 1352           Program Oxygen Prescription   Program Oxygen Prescription Continuous;E-Tanks Continuous;E-Tanks      Comments needs 4 to 6 liters depending on activity needs 4 to 6 liters depending on activity        Home Oxygen   Home Oxygen Device Home Concentrator;E-Tanks Home  Concentrator;E-Tanks      Sleep Oxygen Prescription CPAP CPAP      Liters per minute 3 3      Home Exercise Oxygen Prescription Continuous Continuous      Liters per minute -  4 to 6 -  4-6      Home at Rest Exercise Oxygen Prescription None Continuous      Liters per minute 3 3      Compliance with Home Oxygen Use Yes Yes        Goals/Expected Outcomes   Short Term Goals To learn and understand  importance of monitoring SPO2 with pulse oximeter and demonstrate accurate use of the pulse oximeter.;To learn and understand importance of maintaining oxygen saturations>88%;To learn and exhibit compliance with exercise, home and travel O2 prescription;To learn and demonstrate proper pursed lip breathing techniques or other breathing techniques.;To learn and demonstrate proper use of respiratory medications To learn and understand importance of monitoring SPO2 with pulse oximeter and demonstrate accurate use of the pulse oximeter.;To learn and understand importance of maintaining oxygen saturations>88%;To learn and exhibit compliance with exercise, home and travel O2 prescription;To learn and demonstrate proper pursed lip breathing techniques or other breathing techniques.;To learn and demonstrate proper use of respiratory medications      Long  Term Goals Exhibits compliance with exercise, home and travel O2 prescription;Verbalizes importance of monitoring SPO2 with pulse oximeter and return demonstration;Maintenance of O2 saturations>88%;Exhibits proper breathing techniques, such as pursed lip breathing or other method taught during program session;Compliance with respiratory medication Exhibits compliance with exercise, home and travel O2 prescription;Verbalizes importance of monitoring SPO2 with pulse oximeter and return demonstration;Maintenance of O2 saturations>88%;Exhibits proper breathing techniques, such as pursed lip breathing or other method taught during program session;Compliance with  respiratory medication      Comments  - patient verbalizes compliance with home oxygen use      Goals/Expected Outcomes  - see above         Oxygen Discharge (Final Oxygen Re-Evaluation):     Oxygen Re-Evaluation - 12/26/16 1352      Program Oxygen Prescription   Program Oxygen Prescription Continuous;E-Tanks   Comments needs 4 to 6 liters depending on activity     Home Oxygen   Home Oxygen Device Home Concentrator;E-Tanks   Sleep Oxygen Prescription CPAP   Liters per minute 3   Home Exercise Oxygen Prescription Continuous   Liters per minute --  4-6   Home at Rest Exercise Oxygen Prescription Continuous   Liters per minute 3   Compliance with Home Oxygen Use Yes     Goals/Expected Outcomes   Short Term Goals To learn and understand importance of monitoring SPO2 with pulse oximeter and demonstrate accurate use of the pulse oximeter.;To learn and understand importance of maintaining oxygen saturations>88%;To learn and exhibit compliance with exercise, home and travel O2 prescription;To learn and demonstrate proper pursed lip breathing techniques or other breathing techniques.;To learn and demonstrate proper use of respiratory medications   Long  Term Goals Exhibits compliance with exercise, home and travel O2 prescription;Verbalizes importance of monitoring SPO2 with pulse oximeter and return demonstration;Maintenance of O2 saturations>88%;Exhibits proper breathing techniques, such as pursed lip breathing or other method taught during program session;Compliance with respiratory medication   Comments patient verbalizes compliance with home oxygen use   Goals/Expected Outcomes see above      Initial Exercise Prescription:     Initial Exercise Prescription - 11/11/16 1600      Date of Initial Exercise RX and Referring Provider   Date 11/11/16   Referring Provider Dr. Aundra Dubin     Oxygen   Oxygen Continuous   Liters --  4-6     Recumbant Bike   Level 1   Minutes 17      NuStep   Level 1   Minutes 17   METs 1.3     Track   Laps 4   Minutes 17     Prescription Details   Frequency (times per week) 2   Duration Progress to 45 minutes of aerobic exercise without signs/symptoms of physical distress  Intensity   THRR 40-80% of Max Heartrate 60-119   Ratings of Perceived Exertion 11-13   Perceived Dyspnea 0-4     Progression   Progression Continue to progress workloads to maintain intensity without signs/symptoms of physical distress.     Resistance Training   Training Prescription Yes   Weight orange bands   Reps 10-15      Perform Capillary Blood Glucose checks as needed.  Exercise Prescription Changes:     Exercise Prescription Changes    Row Name 11/13/16 1600 11/18/16 1500 11/20/16 1500 11/27/16 1500 12/02/16 1545     Response to Exercise   Blood Pressure (Admit) 98/54 110/70 130/68 96/54 102/58   Blood Pressure (Exercise) 106/60 132/80 118/56 128/70 100/62   Blood Pressure (Exit) 102/58 104/64 110/70 106/70 102/62   Heart Rate (Admit) 62 bpm 54 bpm 61 bpm 63 bpm 65 bpm   Heart Rate (Exercise) 73 bpm 72 bpm 76 bpm 77 bpm 73 bpm   Heart Rate (Exit) 63 bpm 54 bpm 63 bpm 60 bpm 64 bpm   Oxygen Saturation (Admit) 89 % 88 %  increased O2 from 3L to 4L SaO2 inc to 90%  92 % 91 % 94 %   Oxygen Saturation (Exercise) 88 % 88 %  inc O2 from 4L to 6L, SaO2 inc to 94% 87 %  O2 increased to 6L sat up to 89 90 % 90 %   Oxygen Saturation (Exit) 93 % 98 %  6L O2 97 % 95 % 91 %   Rating of Perceived Exertion (Exercise) _0 Perceived Dyspnea (Exercise) _1 Duration Progress to 45 minutes of aerobic exercise without signs/symptoms of physical distress Progress to 45 minutes of aerobic exercise without signs/symptoms of physical distress Progress to 45 minutes of aerobic exercise without signs/symptoms of physical distress Progress to 45 minutes of aerobic exercise without signs/symptoms of physical distress Progress to 45  minutes of aerobic exercise without signs/symptoms of physical distress   Intensity _2      Progression   Progression Continue to progress workloads to maintain intensity without signs/symptoms of physical distress. Continue to progress workloads to maintain intensity without signs/symptoms of physical distress. Continue to progress workloads to maintain intensity without signs/symptoms of physical distress. Continue to progress workloads to maintain intensity without signs/symptoms of physical distress. Continue to progress workloads to maintain intensity without signs/symptoms of physical distress.     Resistance Training   Training Prescription _3    Weight _4    Reps 10-15 10-15 10-15 10-15 10-15   Time  - 10 Minutes 10 Minutes 10 Minutes 10 Minutes     Oxygen   Oxygen _5    Liters -  4-6 -  4-6 4-6 4-6 4-6     Recumbant Bike   Level  - _6 Minutes  - _7 NuStep   Level _8 - 2   Minutes _9 - 17   METs 2 1.4  -  - 1.4     Track   Laps _10 Minutes _11 Row Name 12/04/16 1600 12/16/16 1545 12/18/16 1527 12/25/16 1600       Response to  Exercise   Blood Pressure (Admit) 82/40  Rechecked BP 112/62 94/64 104/62 92/54    Blood Pressure (Exercise) 130/70 120/60 112/60 118/62    Blood Pressure (Exit) 100/60 104/70 108/58 90/60    Heart Rate (Admit) 57 bpm 60 bpm 63 bpm 73 bpm    Heart Rate (Exercise) 73 bpm 74 bpm 77 bpm 82 bpm    Heart Rate (Exit) 60 bpm 61 bpm 61 bpm 77 bpm    Oxygen Saturation (Admit) 93 % 92 % 92 % 97 %    Oxygen Saturation (Exercise) 95 % 95 % 90 % 92 %    Oxygen Saturation (Exit) 90 % 91 % 94 % 95 %    Rating of Perceived Exertion (Exercise) _0 Perceived Dyspnea (Exercise) _1 Duration  Progress to 45 minutes of aerobic exercise without signs/symptoms of physical distress Progress to 45 minutes of aerobic exercise without signs/symptoms of physical distress Progress to 45 minutes of aerobic exercise without signs/symptoms of physical distress Progress to 45 minutes of aerobic exercise without signs/symptoms of physical distress    Intensity THRR unchanged THRR unchanged THRR unchanged THRR unchanged      Progression   Progression Continue to progress workloads to maintain intensity without signs/symptoms of physical distress. Continue to progress workloads to maintain intensity without signs/symptoms of physical distress. Continue to progress workloads to maintain intensity without signs/symptoms of physical distress. Continue to progress workloads to maintain intensity without signs/symptoms of physical distress.      Resistance Training   Training Prescription Yes Yes Yes Yes    Weight orange bands orange bands orange bands orange bands    Reps 10-15 10-15 10-15 10-15    Time 10 Minutes 10 Minutes 10 Minutes 10 Minutes      Oxygen   Oxygen Continuous Continuous Continuous Continuous    Liters 4-6 4-6 4-6 4-6      Recumbant Bike   Level  - 4 4  -    Minutes  - 17 17  -      NuStep   Level _2 Minutes _3 METs 1.7 2 1.7 1.7      Track   Laps 8  - 6 9    Minutes 17  - 17 17      Home Exercise Plan   Plans to continue exercise at  -  - Home (comment)  -    Frequency  -  - Add 3 additional days to program exercise sessions.  -       Exercise Comments:     Exercise Comments    Row Name 12/18/16 1543           Exercise Comments home exercise completed          Exercise Goals and Review:     Exercise Goals    [place] Name 11/07/16 1447             Exercise Goals   Increase Physical Activity Yes       Intervention Provide advice, education, support and counseling about physical activity/exercise needs.;Develop an individualized  exercise prescription for aerobic and resistive training based on initial evaluation findings, risk stratification, comorbidities and participant's personal goals.       Expected Outcomes Achievement of increased cardiorespiratory fitness and enhanced flexibility, muscular endurance and strength shown through measurements of functional capacity and personal statement of  participant.       Increase Strength and Stamina Yes       Intervention Provide advice, education, support and counseling about physical activity/exercise needs.;Develop an individualized exercise prescription for aerobic and resistive training based on initial evaluation findings, risk stratification, comorbidities and participant's personal goals.       Expected Outcomes Achievement of increased cardiorespiratory fitness and enhanced flexibility, muscular endurance and strength shown through measurements of functional capacity and personal statement of participant.          Exercise Goals Re-Evaluation :     Exercise Goals Re-Evaluation    Row Name 12/02/16 0944 12/22/16 1407           Exercise Goal Re-Evaluation   Exercise Goals Review Increase Strength and Stamina;Increase Physical Activity;Able to understand and use rate of perceived exertion (RPE) scale;Knowledge and understanding of Target Heart Rate Range (THRR);Understanding of Exercise Prescription;Able to understand and use Dyspnea scale Increase Strength and Stamina;Able to understand and use Dyspnea scale;Increase Physical Activity;Able to understand and use rate of perceived exertion (RPE) scale;Knowledge and understanding of Target Heart Rate Range (THRR);Understanding of Exercise Prescription      Comments Patient has only attended four exercise sessions. Will cont. to monitor and progress as able.  Patient is progressing slowly in program. She averages a MET level of 1.7-2.0. Will cont. to monitor and progress as able.       Expected Outcomes Through exercising at  rehab and at home, patient will increase physical strength and stamina and find ADL's easier to perform.  Through exercising at rehab and at home, patient will increase physical strength and stamina and find ADL's easier to perform.          Discharge Exercise Prescription (Final Exercise Prescription Changes):     Exercise Prescription Changes - 12/25/16 1600      Response to Exercise   Blood Pressure (Admit) 92/54   Blood Pressure (Exercise) 118/62   Blood Pressure (Exit) 90/60   Heart Rate (Admit) 73 bpm   Heart Rate (Exercise) 82 bpm   Heart Rate (Exit) 77 bpm   Oxygen Saturation (Admit) 97 %   Oxygen Saturation (Exercise) 92 %   Oxygen Saturation (Exit) 95 %   Rating of Perceived Exertion (Exercise) 12   Perceived Dyspnea (Exercise) 2   Duration Progress to 45 minutes of aerobic exercise without signs/symptoms of physical distress   Intensity THRR unchanged     Progression   Progression Continue to progress workloads to maintain intensity without signs/symptoms of physical distress.     Resistance Training   Training Prescription Yes   Weight orange bands   Reps 10-15   Time 10 Minutes     Oxygen   Oxygen Continuous   Liters 4-6     NuStep   Level 4   Minutes 17   METs 1.7     Track   Laps 9   Minutes 17      Nutrition:  Target Goals: Understanding of nutrition guidelines, daily intake of sodium <1545m, cholesterol <2062m calories 30% from fat and 7% or less from saturated fats, daily to have 5 or more servings of fruits and vegetables.  Biometrics:     Pre Biometrics - 11/07/16 1543      Pre Biometrics   Grip Strength 23 kg       Nutrition Therapy Plan and Nutrition Goals:     Nutrition Therapy & Goals - 12/22/16 1036      Nutrition Therapy  Diet Therapeutic Lifestyle Changes     Personal Nutrition Goals   Nutrition Goal Identify food quantities necessary to achieve wt loss of  -2# per week to a goal wt loss of 2.7-10.9 kg (6-24 lb) at  graduation from pulmonary rehab.     Intervention Plan   Intervention Prescribe, educate and counsel regarding individualized specific dietary modifications aiming towards targeted core components such as weight, hypertension, lipid management, diabetes, heart failure and other comorbidities.   Expected Outcomes Short Term Goal: Understand basic principles of dietary content, such as calories, fat, sodium, cholesterol and nutrients.;Long Term Goal: Adherence to prescribed nutrition plan.      Nutrition Discharge: Rate Your Plate Scores:     Nutrition Assessments - 12/22/16 1036      Rate Your Plate Scores   Pre Score 52      Nutrition Goals Re-Evaluation:   Nutrition Goals Discharge (Final Nutrition Goals Re-Evaluation):   Psychosocial: Target Goals: Acknowledge presence or absence of significant depression and/or stress, maximize coping skills, provide positive support system. Participant is able to verbalize types and ability to use techniques and skills needed for reducing stress and depression.  Initial Review & Psychosocial Screening:     Initial Psych Review & Screening - 11/07/16 1505      Initial Review   Current issues with None Identified     Family Dynamics   Good Support System? Yes   Concerns Recent loss of significant other   Comments lost husband 2 years ago. only married for 2 years     Barriers   Psychosocial barriers to participate in program There are no identifiable barriers or psychosocial needs.     Screening Interventions   Interventions Encouraged to exercise      Quality of Life Scores:   PHQ-9: Recent Review Flowsheet Data    Depression screen [facility] 2/9 11/07/2016 10/17/2016 10/13/2016 10/06/2016   Decreased Interest _0 Down, Depressed, Hopeless 0 0 1 1   PHQ - 2 Score _1 Altered sleeping - - 1 1   Tired, decreased energy - - 1 1   Change in appetite - - 0 0   Feeling bad or failure about yourself  - - 0 -   Trouble  concentrating - - 0 0   Moving slowly or fidgety/restless - - 0 0   Suicidal thoughts - - 0 0   PHQ-9 Score - - 4 4   Difficult doing work/chores - - Not difficult at all -     Interpretation of Total Score  Total Score Depression Severity:  1-4 = Minimal depression, 5-9 = Mild depression, 10-14 = Moderate depression, 15-19 = Moderately severe depression, 20-27 = Severe depression   Psychosocial Evaluation and Intervention:     Psychosocial Evaluation - 11/07/16 1506      Psychosocial Evaluation & Interventions   Interventions Encouraged to exercise with the program and follow exercise prescription   Continue Psychosocial Services  Follow up required by staff      Psychosocial Re-Evaluation:     Psychosocial Re-Evaluation    Row Name 12/03/16 2111 12/26/16 1355           Psychosocial Re-Evaluation   Current issues with None Identified None Identified      Comments lost husband 2 years ago and has lost contact with his children which saddens her. she was only married for 2 years. lost husband 2 years ago and has lost contact with  his children which saddens her. she was only married for 2 years.      Expected Outcomes patient will remain free from psychosocial barriers to participation patient will remain free from psychosocial barriers to participation      Interventions Encouraged to attend Pulmonary Rehabilitation for the exercise Encouraged to attend Pulmonary Rehabilitation for the exercise      Continue Psychosocial Services  Follow up required by staff Follow up required by staff         Psychosocial Discharge (Final Psychosocial Re-Evaluation):     Psychosocial Re-Evaluation - 12/26/16 1355      Psychosocial Re-Evaluation   Current issues with None Identified   Comments lost husband 2 years ago and has lost contact with his children which saddens her. she was only married for 2 years.   Expected Outcomes patient will remain free from psychosocial barriers to  participation   Interventions Encouraged to attend Pulmonary Rehabilitation for the exercise   Continue Psychosocial Services  Follow up required by staff      Education: Education Goals: Education classes will be provided on a weekly basis, covering required topics. Participant will state understanding/return demonstration of topics presented.  Learning Barriers/Preferences:     Learning Barriers/Preferences - 11/07/16 1501      Learning Barriers/Preferences   Learning Barriers None   Learning Preferences Group Instruction;Individual Instruction;Verbal Instruction;Written Material      Education Topics: Risk Factor Reduction:  -Group instruction that is supported by a PowerPoint presentation. Instructor discusses the definition of a risk factor, different risk factors for pulmonary disease, and how the heart and lungs work together.     Nutrition for Pulmonary Patient:  -Group instruction provided by PowerPoint slides, verbal discussion, and written materials to support subject matter. The instructor gives an explanation and review of healthy diet recommendations, which includes a discussion on weight management, recommendations for fruit and vegetable consumption, as well as protein, fluid, caffeine, fiber, sodium, sugar, and alcohol. Tips for eating when patients are short of breath are discussed.   PULMONARY REHAB OTHER RESPIRATORY from 12/25/2016 in [place]  Date  11/27/16  Educator  edna  Instruction Review Code  2- meets goals/outcomes      Pursed Lip Breathing:  -Group instruction that is supported by demonstration and informational handouts. Instructor discusses the benefits of pursed lip and diaphragmatic breathing and detailed demonstration on how to preform both.     Oxygen Safety:  -Group instruction provided by PowerPoint, verbal discussion, and written material to support subject matter. There is an overview of "What is Oxygen"  and "Why do we need it".  Instructor also reviews how to create a safe environment for oxygen use, the importance of using oxygen as prescribed, and the risks of noncompliance. There is a brief discussion on traveling with oxygen and resources the patient may utilize.   PULMONARY REHAB OTHER RESPIRATORY from 12/25/2016 in [place]  Date  11/13/16  Educator  Truddie Crumble  Instruction Review Code  2- meets goals/outcomes      Oxygen Equipment:  -Group instruction provided by [employer] Staff utilizing handouts, written materials, and equipment demonstrations.   Signs and Symptoms:  -Group instruction provided by written material and verbal discussion to support subject matter. Warning signs and symptoms of infection, stroke, and heart attack are reviewed and when to call the physician/911 reinforced. Tips for preventing the spread of infection discussed.   PULMONARY REHAB OTHER RESPIRATORY from 12/25/2016 in  [place]  Date  12/25/16  Educator  rn  Instruction Review Code  2- meets goals/outcomes      Advanced Directives:  -Group instruction provided by verbal instruction and written material to support subject matter. Instructor reviews Advanced Directive laws and proper instruction for filling out document.   Pulmonary Video:  -Group video education that reviews the importance of medication and oxygen compliance, exercise, good nutrition, pulmonary hygiene, and pursed lip and diaphragmatic breathing for the pulmonary patient.   Exercise for the Pulmonary Patient:  -Group instruction that is supported by a PowerPoint presentation. Instructor discusses benefits of exercise, core components of exercise, frequency, duration, and intensity of an exercise routine, importance of utilizing pulse oximetry during exercise, safety while exercising, and options of places to exercise outside of rehab.     PULMONARY REHAB OTHER RESPIRATORY  from 12/25/2016 in [place]  Date  12/16/16  Educator  ep  Instruction Review Code  2- meets goals/outcomes      Pulmonary Medications:  -Verbally interactive group education provided by instructor with focus on inhaled medications and proper administration.   PULMONARY REHAB OTHER RESPIRATORY from 12/25/2016 in [place]  Date  12/04/16  Educator  pharmacist  Instruction Review Code  2- meets goals/outcomes      Anatomy and Physiology of the Respiratory System and Intimacy:  -Group instruction provided by PowerPoint, verbal discussion, and written material to support subject matter. Instructor reviews respiratory cycle and anatomical components of the respiratory system and their functions. Instructor also reviews differences in obstructive and restrictive respiratory diseases with examples of each. Intimacy, Sex, and Sexuality differences are reviewed with a discussion on how relationships can change when diagnosed with pulmonary disease. Common sexual concerns are reviewed.   MD DAY -A group question and answer session with a medical doctor that allows participants to ask questions that relate to their pulmonary disease state.   OTHER EDUCATION -Group or individual verbal, written, or video instructions that support the educational goals of the pulmonary rehab program.   Knowledge Questionnaire Score:     Knowledge Questionnaire Score - 11/13/16 1114      Knowledge Questionnaire Score   Pre Score 12/13      Core Components/Risk Factors/Patient Goals at Admission:     Personal Goals and Risk Factors at Admission - 11/07/16 1504      Core Components/Risk Factors/Patient Goals on Admission   Improve shortness of breath with ADL's Yes   Intervention Provide education, individualized exercise plan and daily activity instruction to help decrease symptoms of SOB with activities of daily living.   Expected  Outcomes Short Term: Achieves a reduction of symptoms when performing activities of daily living.   Develop more efficient breathing techniques such as purse lipped breathing and diaphragmatic breathing; and practicing self-pacing with activity Yes   Intervention Provide education, demonstration and support about specific breathing techniuqes utilized for more efficient breathing. Include techniques such as pursed lipped breathing, diaphragmatic breathing and self-pacing activity.   Expected Outcomes Short Term: Participant will be able to demonstrate and use breathing techniques as needed throughout daily activities.   Increase knowledge of respiratory medications and ability to use respiratory devices properly  Yes   Intervention Provide education and demonstration as needed of appropriate use of medications, inhalers, and oxygen therapy.   Expected Outcomes Short Term: Achieves understanding of medications use. Understands that oxygen is a medication prescribed by physician. Demonstrates appropriate  use of inhaler and oxygen therapy.      Core Components/Risk Factors/Patient Goals Review:      Goals and Risk Factor Review    Row Name 12/03/16 2109 12/26/16 1354           Core Components/Risk Factors/Patient Goals Review   Personal Goals Review Improve shortness of breath with ADL's;Increase knowledge of respiratory medications and ability to use respiratory devices properly.;Develop more efficient breathing techniques such as purse lipped breathing and diaphragmatic breathing and practicing self-pacing with activity. Improve shortness of breath with ADL's;Increase knowledge of respiratory medications and ability to use respiratory devices properly.;Develop more efficient breathing techniques such as purse lipped breathing and diaphragmatic breathing and practicing self-pacing with activity.      Review patient is beginning to use plb technique without prompting. she has not had any  improvement in her shortness of breath at this point in rehab however she has missed several sessions. patient is beginning to use plb technique without prompting. she has not had any improvement in her shortness of breath at this point in rehab however she has missed several sessions. will stress with patient importance of consistant attendance in order to improve stamina and strength as she wishes      Expected Outcomes see admission outcomes see admission outcomes         Core Components/Risk Factors/Patient Goals at Discharge (Final Review):      Goals and Risk Factor Review - 12/26/16 1354      Core Components/Risk Factors/Patient Goals Review   Personal Goals Review Improve shortness of breath with ADL's;Increase knowledge of respiratory medications and ability to use respiratory devices properly.;Develop more efficient breathing techniques such as purse lipped breathing and diaphragmatic breathing and practicing self-pacing with activity.   Review patient is beginning to use plb technique without prompting. she has not had any improvement in her shortness of breath at this point in rehab however she has missed several sessions. will stress with patient importance of consistant attendance in order to improve stamina and strength as she wishes   Expected Outcomes see admission outcomes      ITP Comments:   Comments: ITP REVIEW Pt is making slow progress toward pulmonary rehab goals after completing 9 sessions. This is related to inconsistent attendance.Recommend continued exercise, life style modification, education, and utilization of breathing techniques to increase stamina and strength and decrease shortness of breath with exertion.

## 2016-12-29 ENCOUNTER — Other Ambulatory Visit: Payer: Self-pay | Admitting: *Deleted

## 2016-12-29 NOTE — Patient Outreach (Signed)
[place] [facility]) Care Management  12/29/2016  GINELLE BAYS 1945/12/18 [id number]   RN outreached to pt today to rescheduled the missed appointment. RN verified identifiers and rescheduled the home visit for tomorrow morning as requested by pt today. Will follow up accordingly and review all goals and plan of care at that time.  [name], RN Care Management Coordinator [place] Office [phone]

## 2016-12-30 ENCOUNTER — Other Ambulatory Visit: Payer: Self-pay | Admitting: *Deleted

## 2016-12-30 ENCOUNTER — Encounter (HOSPITAL_COMMUNITY)
Admission: RE | Admit: 2016-12-30 | Discharge: 2016-12-30 | Disposition: A | Discharge status: Home / Self Care | Payer: Medicare Other | Source: Ambulatory Visit

## 2016-12-30 ENCOUNTER — Encounter (HOSPITAL_COMMUNITY)
Admission: RE | Admit: 2016-12-30 | Discharge: 2016-12-30 | Disposition: A | Discharge status: Home / Self Care | Payer: Medicare Other | Source: Ambulatory Visit | Attending: Cardiology | Admitting: Cardiology

## 2016-12-30 VITALS — Wt 187.6 lb

## 2016-12-30 DIAGNOSIS — I272 Pulmonary hypertension, unspecified: Secondary | ICD-10-CM

## 2016-12-30 NOTE — Patient Outreach (Signed)
[place] [facility]) Care Management   12/30/2016  Amy [name] Dec 31, 1945 [id number]  Amy [name] is an 71 y.o. female  Subjective:  HF: Pt reports she continues to do well with no major issues and continues to weigh daily with her reported weights (184.8 lbs and last week approximately 185 lbs with no reported symptoms. Pt has confirmed she is in the GREEN zone once review of the HF zones today. Pt denies any symptoms related to HF with no swelling, no SOB other then her normal and no coughing or problems sleeping.  HOME O2: Pt denies any breathing problems and remains on her 3 liters which is advanced to 6 liters when exercising in the pulmonary rehabilitation program on Tuesday and Thursday.  MEDICATION: Pt reports new medication added to assist with her pulmonary HTN (Selexipag).   Objective:   Review of Systems  All other systems reviewed and are negative.   Physical Exam  Constitutional: She is oriented to person, place, and time. She appears well-developed and well-nourished.  HENT:  Right Ear: External ear normal.  Left Ear: External ear normal.  Eyes: EOM are normal.  Neck: Normal range of motion. Neck supple.  Cardiovascular: Normal heart sounds.   Respiratory: Effort normal and breath sounds normal.  GI: Soft. Bowel sounds are normal.  Musculoskeletal: Normal range of motion.  Neurological: She is alert and oriented to person, place, and time.  Skin: Skin is warm and dry.  Psychiatric: She has a normal mood and affect. Her behavior is normal. Judgment and thought content normal.    Encounter Medications:   Outpatient Encounter Prescriptions as of 12/30/2016  Medication Sig  . acetaminophen (TYLENOL) 325 MG tablet Take 2 tablets (650 mg total) by mouth every 4 (four) hours as needed for headache or mild pain.  Marland Kitchen atorvastatin (LIPITOR) 80 MG tablet Take 1 tablet (80 mg total) by mouth daily with lunch.  . Cholecalciferol (VITAMIN D) 2000 UNITS  CAPS Take 2,000 Units by mouth daily.   . colchicine 0.6 MG tablet Take 0.6 mg by mouth daily.   . cyclobenzaprine (FLEXERIL) 5 MG tablet Take 1 tablet (5 mg total) by mouth 3 (three) times daily as needed for muscle spasms.  Marland Kitchen ezetimibe (ZETIA) 10 MG tablet TAKE 1 TABLET (10 MG TOTAL) BY MOUTH IN THE EVENING  . furosemide (LASIX) 20 MG tablet Take 1 tablet (20 mg total) by mouth daily.  . Glycopyrrolate-Formoterol (BEVESPI AEROSPHERE) 9-4.8 MCG/ACT AERO Inhale 1 puff into the lungs 2 (two) times daily.  Marland Kitchen levothyroxine (SYNTHROID, LEVOTHROID) 100 MCG tablet Take 100 mcg by mouth daily before breakfast.  . Macitentan (OPSUMIT) 10 MG TABS Take 1 tablet (10 mg total) by mouth daily.  . metoprolol tartrate (LOPRESSOR) 25 MG tablet Take 0.5 tablets (12.5 mg total) by mouth daily as needed.  . MULTAQ 400 MG tablet TAKE 1 TABLET BY MOUTH TWICE A DAY WITH A MEAL  . Multiple Vitamin (MULTIVITAMIN) capsule Take 1 capsule by mouth daily.  Marland Kitchen NITROSTAT 0.4 MG SL tablet PLACE 1 TABLET (0.4 MG TOTAL) UNDER THE TONGUE EVERY 5 (FIVE) MINUTES AS NEEDED FOR CHEST PAIN.  Marland Kitchen pantoprazole (PROTONIX) 40 MG tablet TAKE 1 TABLET BY MOUTH DAILY  . potassium chloride SA (K-DUR,KLOR-CON) 20 MEQ tablet Take 0.5 tablets (10 mEq total) by mouth at bedtime.  . Rivaroxaban (XARELTO) 15 MG TABS tablet Take 1 tablet (15 mg total) by mouth daily with supper.  . Selexipag (UPTRAVI) 200 MCG TABS Take 200 mcg  by mouth 2 (two) times daily.  Marland Kitchen UNABLE TO FIND Med Name: CPAP  DME-[facility]   No facility-administered encounter medications on file as of 12/30/2016.     Functional Status:   In your present state of health, do you have any difficulty performing the following activities: 10/17/2016 10/13/2016  Hearing? Tempie Donning  Vision? N N  Difficulty concentrating or making decisions? N N  Walking or climbing stairs? Y Y  Dressing or bathing? N N  Doing errands, shopping? N N  Preparing Food and eating ? N N  Using the Toilet? N N  In the past  six months, have you accidently leaked urine? N N  Do you have problems with loss of bowel control? N N  Managing your Medications? N N  Managing your Finances? N N  Housekeeping or managing your Housekeeping? Y Y  Some recent data might be hidden    Fall/Depression Screening:    Fall Risk  11/07/2016 10/17/2016 10/13/2016  Falls in the past year? No No No  Risk for fall due to : - - Impaired mobility   PHQ 2/9 Scores 11/07/2016 10/17/2016 10/13/2016 10/06/2016  PHQ - 2 Score _0 PHQ- 9 Score - - 4 4   BP 96/60 (BP Location: Right Arm, Patient Position: Sitting, Cuff Size: Normal)   Pulse (!) 54   Resp 20   Wt 184 lb 12.8 oz (83.8 kg)   SpO2 96%   BMI 32.74 kg/m  Assessment:   Ongoing management of care related to HF Home O2 New Medication adherence and education   Plan:  Will verified pt's management of care with daily weights and reiterate on the HF zone and confirm pt remains in the GREEN zone with her ongoing management of care. RN confirmed pt's weights and RN will continue to encourage adherence.  Will verified pt remains stable with her breathing via 3 liters and 6 liters during her pulmonary rehabilitation exercises. Will verified pt continues to have a sufficient supply of portable due to her weekly rehabilitation. Will verified pt's medication list and discussed the new medication Selexipag and the purpose of this medication that helps with for her pulmonary HTN. Will verified any related symptoms or side effects as pt aware to report to her provider if acute or urgent.  Plan of care will be discussed along with goals and RN will re-evaluate and confirm adherence on a telephonic call next month as pt getting close to graduating from the [facility] program. Will verify no community resources needed at time.  [name], RN Care Management Coordinator [place] Office [phone]

## 2016-12-30 NOTE — Progress Notes (Signed)
Daily Session Note  Patient Details  Name: Amy [name] MRN: [id number] Date of Birth: 1945-07-19 Referring Provider:     Pulmonary Rehab Walk Test from 11/11/2016 in [place]  Referring Provider  Dr. Aundra Dubin      Encounter Date: 12/30/2016  Check In:     Session Check In - 12/30/16 1330      Check-In   Location [facility]   Staff Present Su Hilt, MS, ACSM RCEP, Exercise Physiologist;Maleeyah Mccaughey Rollene Rotunda, RN, Roque Cash, RN   Supervising physician immediately available to respond to emergencies [facility] immediately available   Physician(s) Dr. Wynelle Cleveland   Medication changes reported     No   Fall or balance concerns reported    No   Tobacco Cessation No Change   Warm-up and Cool-down Performed as group-led instruction   Resistance Training Performed Yes   VAD Patient? No     Pain Assessment   Currently in Pain? No/denies   Multiple Pain Sites No      Capillary Blood Glucose: No results found for this or any previous visit (from the past 24 hour(s)).      Exercise Prescription Changes - 12/30/16 1526      Response to Exercise   Blood Pressure (Admit) 88/44   Blood Pressure (Exercise) 110/50   Blood Pressure (Exit) 96/60   Heart Rate (Admit) 65 bpm   Heart Rate (Exercise) 80 bpm   Heart Rate (Exit) 65 bpm   Oxygen Saturation (Admit) 91 %   Oxygen Saturation (Exercise) 91 %   Oxygen Saturation (Exit) 92 %   Rating of Perceived Exertion (Exercise) 11   Perceived Dyspnea (Exercise) 3   Duration Progress to 45 minutes of aerobic exercise without signs/symptoms of physical distress   Intensity THRR unchanged     Progression   Progression Continue to progress workloads to maintain intensity without signs/symptoms of physical distress.     Resistance Training   Training Prescription Yes   Weight orange bands   Reps 10-15   Time 10 Minutes     Oxygen   Oxygen Continuous   Liters 4-6     Recumbant Bike   Level 2   Minutes 17     NuStep   Level 4   Minutes 17   METs 1.7     Track   Laps 10   Minutes 17      History  Smoking Status  . Former Smoker  . Packs/day: 0.50  . Years: 44.00  . Types: Cigarettes  . Quit date: 04/07/2009  Smokeless Tobacco  . Never Used    Goals Met:  Using PLB without cueing & demonstrates good technique Exercise tolerated well No report of cardiac concerns or symptoms Strength training completed today  Goals Unmet:  Not Applicable  Comments: Service time is from 1330 to 1500   Dr. Rush Farmer is Medical Director for Pulmonary Rehab at [facility].

## 2017-01-01 ENCOUNTER — Encounter (HOSPITAL_COMMUNITY)
Admission: RE | Admit: 2017-01-01 | Discharge: 2017-01-01 | Disposition: A | Discharge status: Home / Self Care | Payer: Medicare Other | Source: Ambulatory Visit | Attending: Cardiology | Admitting: Cardiology

## 2017-01-01 VITALS — Wt 187.2 lb

## 2017-01-01 DIAGNOSIS — I5032 Chronic diastolic (congestive) heart failure: Secondary | ICD-10-CM | POA: Diagnosis not present

## 2017-01-01 DIAGNOSIS — I2510 Atherosclerotic heart disease of native coronary artery without angina pectoris: Secondary | ICD-10-CM | POA: Diagnosis not present

## 2017-01-01 DIAGNOSIS — I272 Pulmonary hypertension, unspecified: Secondary | ICD-10-CM

## 2017-01-01 DIAGNOSIS — Z79899 Other long term (current) drug therapy: Secondary | ICD-10-CM | POA: Diagnosis not present

## 2017-01-01 DIAGNOSIS — Z7901 Long term (current) use of anticoagulants: Secondary | ICD-10-CM | POA: Diagnosis not present

## 2017-01-01 DIAGNOSIS — N183 Chronic kidney disease, stage 3 (moderate): Secondary | ICD-10-CM | POA: Diagnosis not present

## 2017-01-01 NOTE — Progress Notes (Signed)
Amy [name]  71 y.o. female   DOB: 1945/10/26 MRN: [id number]          Nutrition Note 1. Pulmonary hypertension ([place])    Note Spoke with pt. Pt is obese. There are some ways the pt can make her eating habits healthier. Pt's Rate Your Plate results reviewed with pt. Pt is pre-diabetic according to her last A1c 2 years ago. Per discussion, pt was unaware of pre-diabetes. Pt encouraged to discuss pre-diabetes with her PCP. Pt has been actively making lifestyle changes to help prevent development of diabetes. Pt states she has lost 29 lb over the past 6 months. Pt has lost 3.7 lb since starting rehab. Rate of wt loss appears safe. Pt expressed understanding of the information reviewed via feedback method.    Nutrition Diagnosis ? Food-and nutrition-related knowledge deficit related to lack of exposure to information as related to diagnosis of pulmonary disease ? Obesity related to excessive energy intake as evidenced by a BMI of 33.7  Nutrition Intervention ? Pt's individual nutrition plan and goals reviewed with pt. ? Handout re: Pre-diabetes given ? Benefits of adopting healthy eating habits discussed when pt's Rate Your Plate reviewed.  Goal(s) 1. Identify food quantities necessary to achieve wt loss of  -2# per week to a goal wt loss of 2.7-10.9 kg (6-24 lb) at graduation from pulmonary rehab. Plan:  Pt to attend Pulmonary Nutrition class Will provide client-centered nutrition education as part of interdisciplinary care.   Monitor and evaluate progress toward nutrition goal with team.  Monitor and Evaluate progress toward nutrition goal with team.   Derek Mound, M.Ed, RD, LDN, CDE 01/01/2017 3:31 PM

## 2017-01-01 NOTE — Progress Notes (Signed)
Daily Session Note  Patient Details  Name: Amy [name] MRN: [id number] Date of Birth: 04-22-1945 Referring Provider:     Pulmonary Rehab Walk Test from 11/11/2016 in [place]  Referring Provider  Dr. Aundra Dubin      Encounter Date: 01/01/2017  Check In:     Session Check In - 01/01/17 1339      Check-In   Location [facility]   Staff Present Su Hilt, MS, ACSM RCEP, Exercise Physiologist;Hamid Brookens Rollene Rotunda, RN, Roque Cash, RN   Supervising physician immediately available to respond to emergencies [facility] immediately available   Physician(s) Dr. Wendee Beavers   Medication changes reported     No   Fall or balance concerns reported    No   Tobacco Cessation No Change   Warm-up and Cool-down Performed as group-led instruction   Resistance Training Performed Yes   VAD Patient? No     Pain Assessment   Currently in Pain? No/denies   Multiple Pain Sites No      Capillary Blood Glucose: No results found for this or any previous visit (from the past 24 hour(s)).      Exercise Prescription Changes - 01/01/17 1533      Response to Exercise   Blood Pressure (Admit) 92/50   Blood Pressure (Exercise) 112/70   Blood Pressure (Exit) 96/60   Heart Rate (Admit) 59 bpm   Heart Rate (Exercise) 73 bpm   Heart Rate (Exit) 57 bpm   Oxygen Saturation (Admit) 93 %   Oxygen Saturation (Exercise) 91 %   Oxygen Saturation (Exit) 97 %   Rating of Perceived Exertion (Exercise) 11   Perceived Dyspnea (Exercise) 1   Duration Progress to 45 minutes of aerobic exercise without signs/symptoms of physical distress   Intensity THRR unchanged     Progression   Progression Continue to progress workloads to maintain intensity without signs/symptoms of physical distress.     Resistance Training   Training Prescription Yes   Weight orange bands   Reps 10-15   Time 10 Minutes     Oxygen   Oxygen Continuous   Liters 4-6     Track    Laps 8   Minutes 17      History  Smoking Status  . Former Smoker  . Packs/day: 0.50  . Years: 44.00  . Types: Cigarettes  . Quit date: 04/07/2009  Smokeless Tobacco  . Never Used    Goals Met:  Using PLB without cueing & demonstrates good technique Exercise tolerated well No report of cardiac concerns or symptoms Strength training completed today  Goals Unmet:  Not Applicable  Comments: Service time is from 1330 to 1505   Dr. Rush Farmer is Medical Director for Pulmonary Rehab at [facility].

## 2017-01-06 ENCOUNTER — Encounter (HOSPITAL_COMMUNITY)
Admission: RE | Admit: 2017-01-06 | Discharge: 2017-01-06 | Disposition: A | Discharge status: Home / Self Care | Payer: Medicare Other | Source: Ambulatory Visit | Attending: Cardiology | Admitting: Cardiology

## 2017-01-06 VITALS — Wt 190.0 lb

## 2017-01-06 DIAGNOSIS — E039 Hypothyroidism, unspecified: Secondary | ICD-10-CM | POA: Insufficient documentation

## 2017-01-06 DIAGNOSIS — I130 Hypertensive heart and chronic kidney disease with heart failure and stage 1 through stage 4 chronic kidney disease, or unspecified chronic kidney disease: Secondary | ICD-10-CM | POA: Diagnosis not present

## 2017-01-06 DIAGNOSIS — K219 Gastro-esophageal reflux disease without esophagitis: Secondary | ICD-10-CM | POA: Insufficient documentation

## 2017-01-06 DIAGNOSIS — I2510 Atherosclerotic heart disease of native coronary artery without angina pectoris: Secondary | ICD-10-CM | POA: Insufficient documentation

## 2017-01-06 DIAGNOSIS — I5032 Chronic diastolic (congestive) heart failure: Secondary | ICD-10-CM | POA: Insufficient documentation

## 2017-01-06 DIAGNOSIS — Z79899 Other long term (current) drug therapy: Secondary | ICD-10-CM | POA: Insufficient documentation

## 2017-01-06 DIAGNOSIS — G4733 Obstructive sleep apnea (adult) (pediatric): Secondary | ICD-10-CM | POA: Insufficient documentation

## 2017-01-06 DIAGNOSIS — M519 Unspecified thoracic, thoracolumbar and lumbosacral intervertebral disc disorder: Secondary | ICD-10-CM | POA: Insufficient documentation

## 2017-01-06 DIAGNOSIS — N183 Chronic kidney disease, stage 3 (moderate): Secondary | ICD-10-CM | POA: Diagnosis not present

## 2017-01-06 DIAGNOSIS — I481 Persistent atrial fibrillation: Secondary | ICD-10-CM | POA: Diagnosis not present

## 2017-01-06 DIAGNOSIS — J449 Chronic obstructive pulmonary disease, unspecified: Secondary | ICD-10-CM | POA: Diagnosis not present

## 2017-01-06 DIAGNOSIS — I272 Pulmonary hypertension, unspecified: Secondary | ICD-10-CM | POA: Insufficient documentation

## 2017-01-06 DIAGNOSIS — E785 Hyperlipidemia, unspecified: Secondary | ICD-10-CM | POA: Insufficient documentation

## 2017-01-06 DIAGNOSIS — Z87891 Personal history of nicotine dependence: Secondary | ICD-10-CM | POA: Diagnosis not present

## 2017-01-06 DIAGNOSIS — H3530 Unspecified macular degeneration: Secondary | ICD-10-CM | POA: Diagnosis not present

## 2017-01-06 DIAGNOSIS — R001 Bradycardia, unspecified: Secondary | ICD-10-CM | POA: Diagnosis not present

## 2017-01-06 DIAGNOSIS — Z7901 Long term (current) use of anticoagulants: Secondary | ICD-10-CM | POA: Diagnosis not present

## 2017-01-06 NOTE — Progress Notes (Signed)
Daily Session Note  Patient Details  Name: Amy [name] MRN: [id number] Date of Birth: 12/06/1945 Referring Provider:     Pulmonary Rehab Walk Test from 11/11/2016 in [place]  Referring Provider  Dr. Aundra Dubin      Encounter Date: 01/06/2017  Check In:     Session Check In - 01/06/17 1535      Check-In   Location [facility]   Staff Present Su Hilt, MS, ACSM RCEP, Exercise Physiologist;Jazmaine Fuelling Ysidro Evert, RN   Supervising physician immediately available to respond to emergencies [facility] immediately available   Physician(s) Dr. Wendee Beavers   Medication changes reported     No   Fall or balance concerns reported    No   Tobacco Cessation No Change   Warm-up and Cool-down Performed as group-led instruction   Resistance Training Performed Yes   VAD Patient? No     Pain Assessment   Currently in Pain? No/denies   Multiple Pain Sites No      Capillary Blood Glucose: No results found for this or any previous visit (from the past 24 hour(s)).      Exercise Prescription Changes - 01/06/17 1600      Response to Exercise   Blood Pressure (Admit) 94/60   Blood Pressure (Exercise) 120/66   Blood Pressure (Exit) 95/64   Heart Rate (Admit) 62 bpm   Heart Rate (Exercise) 112 bpm   Heart Rate (Exit) 59 bpm   Oxygen Saturation (Admit) 94 %   Oxygen Saturation (Exercise) 87 %  Sat at 87% on O2 3L.O2 increased to 6L sat improved to 91.   Oxygen Saturation (Exit) 93 %   Rating of Perceived Exertion (Exercise) 2   Duration Progress to 45 minutes of aerobic exercise without signs/symptoms of physical distress   Intensity THRR unchanged     Progression   Progression Continue to progress workloads to maintain intensity without signs/symptoms of physical distress.     Resistance Training   Training Prescription Yes   Weight orange bands   Reps 10-15   Time 10 Minutes     Oxygen   Oxygen Continuous   Liters 4-6     Recumbant Bike   Level 2   Minutes 17     NuStep   Level 4   Minutes 17   METs 2     Track   Laps 8   Minutes 17      History  Smoking Status  . Former Smoker  . Packs/day: 0.50  . Years: 44.00  . Types: Cigarettes  . Quit date: 04/07/2009  Smokeless Tobacco  . Never Used    Goals Met:  Exercise tolerated well No report of cardiac concerns or symptoms Strength training completed today  Goals Unmet:  NA  Comments: Service time is from 1330 to 1505    Dr. Rush Farmer is Medical Director for Pulmonary Rehab at [facility].

## 2017-01-07 ENCOUNTER — Telehealth (HOSPITAL_COMMUNITY): Payer: Self-pay | Admitting: *Deleted

## 2017-01-07 NOTE — Telephone Encounter (Signed)
I called patient back and she will hold lasix tomorrow and Friday.  She had already taken her dose today.  She will record her BP readings and call us back Monday if she doesn't see any changes.

## 2017-01-07 NOTE — Telephone Encounter (Signed)
Per Dr. Jerilee Field does not typically cause hypotension.   Please have her hold her lasix x 2 days and see if that helps.  If does not improve, should be seen in clinic Tue vs Friday next week with Erin.     Legrand Como [address] [place], PA-C 01/07/2017 1:26 PM

## 2017-01-07 NOTE — Telephone Encounter (Signed)
Patient confirmed she is not taking Metoprolol

## 2017-01-07 NOTE — Telephone Encounter (Signed)
Is patient taking metoprolol?   Legrand Como [address]" [place], PA-C 01/07/2017 12:10 PM

## 2017-01-07 NOTE — Telephone Encounter (Signed)
Advanced Heart Failure Triage Encounter  Patient Name: Amy [name]  Date of Call: 01/07/17  Problem: Low BP  Patient called and reported BP today is 89/64, yesterday it was in the 90's.  Today she complains of feeling dizzy and weak.  She recently started [place] a few weeks ago and started Opsumit over a month ago.    Plan:  Will send to [facility], [place] for review.   Darron Doom, RN

## 2017-01-08 ENCOUNTER — Encounter (HOSPITAL_COMMUNITY)
Admission: RE | Admit: 2017-01-08 | Discharge: 2017-01-08 | Disposition: A | Discharge status: Home / Self Care | Payer: Medicare Other | Source: Ambulatory Visit | Attending: Cardiology | Admitting: Cardiology

## 2017-01-08 VITALS — Wt 188.5 lb

## 2017-01-08 DIAGNOSIS — I272 Pulmonary hypertension, unspecified: Secondary | ICD-10-CM | POA: Diagnosis not present

## 2017-01-08 DIAGNOSIS — N183 Chronic kidney disease, stage 3 (moderate): Secondary | ICD-10-CM | POA: Diagnosis not present

## 2017-01-08 DIAGNOSIS — Z7901 Long term (current) use of anticoagulants: Secondary | ICD-10-CM | POA: Diagnosis not present

## 2017-01-08 DIAGNOSIS — Z79899 Other long term (current) drug therapy: Secondary | ICD-10-CM | POA: Diagnosis not present

## 2017-01-08 DIAGNOSIS — I5032 Chronic diastolic (congestive) heart failure: Secondary | ICD-10-CM | POA: Diagnosis not present

## 2017-01-08 DIAGNOSIS — I2510 Atherosclerotic heart disease of native coronary artery without angina pectoris: Secondary | ICD-10-CM | POA: Diagnosis not present

## 2017-01-08 NOTE — Progress Notes (Signed)
Daily Session Note  Patient Details  Name: Amy [name] MRN: [id number] Date of Birth: 1945-12-06 Referring Provider:     Pulmonary Rehab Walk Test from 11/11/2016 in [place]  Referring Provider  Dr. Aundra Dubin      Encounter Date: 01/08/2017  Check In:     Session Check In - 01/08/17 1341      Check-In   Location [facility]   Staff Present Rodney Langton, RN;Le Ferraz, MS, ACSM RCEP, Exercise Physiologist   Supervising physician immediately available to respond to emergencies [facility] immediately available   Physician(s) Dr. Zigmund Daniel   Medication changes reported     No   Fall or balance concerns reported    No   Tobacco Cessation No Change   Warm-up and Cool-down Performed as group-led instruction   Resistance Training Performed Yes   VAD Patient? No     Pain Assessment   Currently in Pain? No/denies   Multiple Pain Sites No      Capillary Blood Glucose: No results found for this or any previous visit (from the past 24 hour(s)).      Exercise Prescription Changes - 01/08/17 1500      Response to Exercise   Blood Pressure (Admit) 106/60   Blood Pressure (Exercise) 126/60   Blood Pressure (Exit) 112/72   Heart Rate (Admit) 58 bpm   Heart Rate (Exercise) 96 bpm   Heart Rate (Exit) 62 bpm   Oxygen Saturation (Admit) 89 %   Oxygen Saturation (Exercise) 94 %  Sat at 87% on O2 3L.O2 increased to 6L sat improved to 91.   Oxygen Saturation (Exit) 94 %   Rating of Perceived Exertion (Exercise) 13   Perceived Dyspnea (Exercise) 2   Duration Progress to 45 minutes of aerobic exercise without signs/symptoms of physical distress   Intensity THRR unchanged     Progression   Progression Continue to progress workloads to maintain intensity without signs/symptoms of physical distress.     Resistance Training   Training Prescription Yes   Weight orange bands   Reps 10-15   Time 10 Minutes     Oxygen    Oxygen Continuous   Liters 4-6     Recumbant Bike   Level 3   Minutes 17     Track   Laps 11   Minutes 17      History  Smoking Status  . Former Smoker  . Packs/day: 0.50  . Years: 44.00  . Types: Cigarettes  . Quit date: 04/07/2009  Smokeless Tobacco  . Never Used    Goals Met:  Exercise tolerated well No report of cardiac concerns or symptoms Strength training completed today  Goals Unmet:  Not Applicable  Comments: Service time is from 1:30p to 3:15p    Dr. Rush Farmer is Medical Director for Pulmonary Rehab at [facility].

## 2017-01-12 ENCOUNTER — Encounter: Payer: Self-pay | Admitting: Internal Medicine

## 2017-01-13 ENCOUNTER — Encounter (HOSPITAL_COMMUNITY)
Admission: RE | Admit: 2017-01-13 | Discharge: 2017-01-13 | Disposition: A | Discharge status: Home / Self Care | Payer: Medicare Other | Source: Ambulatory Visit | Attending: Cardiology | Admitting: Cardiology

## 2017-01-13 VITALS — Wt 188.1 lb

## 2017-01-13 DIAGNOSIS — Z7901 Long term (current) use of anticoagulants: Secondary | ICD-10-CM | POA: Diagnosis not present

## 2017-01-13 DIAGNOSIS — I272 Pulmonary hypertension, unspecified: Secondary | ICD-10-CM

## 2017-01-13 DIAGNOSIS — I2510 Atherosclerotic heart disease of native coronary artery without angina pectoris: Secondary | ICD-10-CM | POA: Diagnosis not present

## 2017-01-13 DIAGNOSIS — N183 Chronic kidney disease, stage 3 (moderate): Secondary | ICD-10-CM | POA: Diagnosis not present

## 2017-01-13 DIAGNOSIS — I5032 Chronic diastolic (congestive) heart failure: Secondary | ICD-10-CM | POA: Diagnosis not present

## 2017-01-13 DIAGNOSIS — Z79899 Other long term (current) drug therapy: Secondary | ICD-10-CM | POA: Diagnosis not present

## 2017-01-13 NOTE — Progress Notes (Signed)
Daily Session Note  Patient Details  Name: Amy [name] MRN: [id number] Date of Birth: 12-Jan-1946 Referring Provider:     Pulmonary Rehab Walk Test from 11/11/2016 in [place]  Referring Provider  Dr. Aundra Dubin      Encounter Date: 01/13/2017  Check In:     Session Check In - 01/13/17 1352      Check-In   [facility]   Staff Present Su Hilt, MS, ACSM RCEP, Exercise Physiologist;Portia Rollene Rotunda, RN, BSN   Supervising physician immediately available to respond to emergencies [facility] immediately available   Physician(s) Dr. Zigmund Daniel   Medication changes reported     No   Fall or balance concerns reported    No   Tobacco Cessation No Change   Warm-up and Cool-down Performed as group-led instruction   Resistance Training Performed Yes   VAD Patient? No     Pain Assessment   Currently in Pain? No/denies   Multiple Pain Sites No      Capillary Blood Glucose: No results found for this or any previous visit (from the past 24 hour(s)).      Exercise Prescription Changes - 01/13/17 1500      Response to Exercise   Blood Pressure (Admit) 102/60   Blood Pressure (Exercise) 120/54   Blood Pressure (Exit) 100/62   Heart Rate (Admit) 59 bpm   Heart Rate (Exercise) 74 bpm   Heart Rate (Exit) 64 bpm   Oxygen Saturation (Admit) 90 %   Oxygen Saturation (Exercise) 93 %   Oxygen Saturation (Exit) 92 %   Rating of Perceived Exertion (Exercise) 1   Perceived Dyspnea (Exercise) 3   Symptoms 1   Duration Progress to 45 minutes of aerobic exercise without signs/symptoms of physical distress   Intensity THRR unchanged     Progression   Progression Continue to progress workloads to maintain intensity without signs/symptoms of physical distress.     Resistance Training   Training Prescription Yes   Weight orange bANDS   Reps 10-15   Time 10 Minutes     Recumbant Bike   Level 3   Minutes 17     Track   Laps 9   Minutes 17      History  Smoking Status  . Former Smoker  . Packs/day: 0.50  . Years: 44.00  . Types: Cigarettes  . Quit date: 04/07/2009  Smokeless Tobacco  . Never Used    Goals Met:  Exercise tolerated well No report of cardiac concerns or symptoms Strength training completed today  Goals Unmet:  Not Applicable  Comments: Service time is from 1330 to 1530    Dr. Rush Farmer is Medical Director for Pulmonary Rehab at [facility].

## 2017-01-15 ENCOUNTER — Encounter (HOSPITAL_COMMUNITY): Payer: Medicare Other

## 2017-01-19 ENCOUNTER — Encounter (HOSPITAL_COMMUNITY): Payer: Self-pay | Admitting: Cardiology

## 2017-01-19 ENCOUNTER — Ambulatory Visit (HOSPITAL_COMMUNITY)
Admission: RE | Admit: 2017-01-19 | Discharge: 2017-01-19 | Disposition: A | Discharge status: Home / Self Care | Payer: Medicare Other | Source: Ambulatory Visit | Attending: Cardiology | Admitting: Cardiology

## 2017-01-19 VITALS — BP 117/59 | HR 65 | Wt 189.0 lb

## 2017-01-19 DIAGNOSIS — J449 Chronic obstructive pulmonary disease, unspecified: Secondary | ICD-10-CM | POA: Diagnosis not present

## 2017-01-19 DIAGNOSIS — E039 Hypothyroidism, unspecified: Secondary | ICD-10-CM | POA: Insufficient documentation

## 2017-01-19 DIAGNOSIS — E785 Hyperlipidemia, unspecified: Secondary | ICD-10-CM | POA: Diagnosis not present

## 2017-01-19 DIAGNOSIS — Z79899 Other long term (current) drug therapy: Secondary | ICD-10-CM | POA: Diagnosis not present

## 2017-01-19 DIAGNOSIS — I5032 Chronic diastolic (congestive) heart failure: Secondary | ICD-10-CM | POA: Diagnosis not present

## 2017-01-19 DIAGNOSIS — I480 Paroxysmal atrial fibrillation: Secondary | ICD-10-CM | POA: Insufficient documentation

## 2017-01-19 DIAGNOSIS — K219 Gastro-esophageal reflux disease without esophagitis: Secondary | ICD-10-CM | POA: Diagnosis not present

## 2017-01-19 DIAGNOSIS — I2721 Secondary pulmonary arterial hypertension: Secondary | ICD-10-CM | POA: Diagnosis not present

## 2017-01-19 DIAGNOSIS — Z87891 Personal history of nicotine dependence: Secondary | ICD-10-CM | POA: Insufficient documentation

## 2017-01-19 DIAGNOSIS — I2510 Atherosclerotic heart disease of native coronary artery without angina pectoris: Secondary | ICD-10-CM | POA: Insufficient documentation

## 2017-01-19 DIAGNOSIS — I422 Other hypertrophic cardiomyopathy: Secondary | ICD-10-CM | POA: Diagnosis not present

## 2017-01-19 DIAGNOSIS — Z7901 Long term (current) use of anticoagulants: Secondary | ICD-10-CM | POA: Diagnosis not present

## 2017-01-19 DIAGNOSIS — I270 Primary pulmonary hypertension: Secondary | ICD-10-CM

## 2017-01-19 DIAGNOSIS — G4733 Obstructive sleep apnea (adult) (pediatric): Secondary | ICD-10-CM | POA: Diagnosis not present

## 2017-01-19 NOTE — Progress Notes (Signed)
Pt completed 6 min walk test completing a distance of 750 ft (229 meters). Pt was on 4LNC w/ O2sats range from 84-94% and HR 83-89. Will send to MD for evaluation.

## 2017-01-19 NOTE — Patient Instructions (Signed)
Your physician recommends that you schedule a follow-up appointment in: 3 months

## 2017-01-19 NOTE — Progress Notes (Signed)
PCP: Dr. Radene Ou Cardiology: Dr. Radford Pax HF Cardiology: Dr. Aundra Dubin  Amy [name] was referred by Dr. Radford Pax for evaluation of pulmonary hypertension and CHF.   71 yo with history of COPD, OSA on CPAP, apical hypertrophic cardiomyopathy, chronic diastolic CHF, and paroxysmal atrial fibrillation presents for evaluation of CHF and pulmonary hypertension.  Patient was admitted in 3/18 with CHF and dyspnea, found to have pulmonary arterial hypertension by RHC.  Prior to this, she has had a long history of PAF.  She is currently taking dronedarone but has had breakthrough fibrillation.  She is in NSR today.  She had an atrial fibrillation ablation in the past. She has apical hypertrophic cardiomyopathy proven by cardiac MRI.   She presents today for followup. She is on Opsumit and has started Selexipag, still titrating up. She was unable to tolerate Adcirca.  She is doing pulmonary rehab now, able to walk 11 laps around the track there (improving). She walks with a walker for balance. Breathing is overall better.  No dyspnea walking around house.  Still short of breath walking longer distances when she goes out.  She remains in NSR today on dronedarone.  No orthopnea/PND.  No chest pain.  SBP 80s-110s, rare lightheadedness when she first stands up.  No BRBPR/melena.   ECG: Personally reviewed.  NSR, LVH with repolarization abnormality.   Labs (2/18): TSH normal Labs (3/18): K 3.8, creatinine 1.26, BNP 424 Labs (5/18): K 4.4, creatinine 1.96, anti-SCL 70 negative, RF very slightly elevated, HIV negative.  Labs (6/18): K 4, creatinine 1.2 Labs (9/18): BNP 149, K 4, creatinine 1.4  4/18 6 minute walk: 109 m  10/18 6 minute walk: 229 m  PMH:  1. COPD: Quit smoking 2011.  - PFTs (3/18): minimal obstruction, minimal restriction, DLCO 22% (severely decreased).  2. Hypothyroidism 3. Hyperlipidemia 4. OSA: Uses CPAP 5. Apical hypertrophic cardiomyopathy:  - Cardiac MRI (7/16) with EF 79%, mid-apical LV  hypertrophy with spade-like ventricle, mid-myocardial LGE was noted at the apex.   6. GERD 7. Atrial fibrillation: Paroxysmal.  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 Long QT interval so dofetilide and sotalol have not been used. She has had atrial fibrillation ablation.  - Currently on dronedarone.  8. Coronary artery disease: LHC (7/16) with nonobstructive disease + 85% ostial stenosis small D1.  9. Diastolic CHF: Echo (5/20) with EF 65-70%, apical hypertrophic cardiomyopathy, severe LAE, PASP 86 mmHg.  - RHC (3/18): mean RA 4, PA 69/23 mean 38, mean PCWP 8, CI 2.17, PVR 7.3 WU Fick, 9.6 WU thermo.  10. Pulmonary hypertension: See RHC above.  - V/Q scan negative 3/18 - HIV, SCL70 negative.  RF borderline elevated, doubt significant.  ANA negative.  - Unable to tolerate Adcirca 11. CPX (12/17): peak VO2 8.5, RER 0.96, VE/VCO2 slope 67. Submaximal but appears to show severe functional impairment.    Social History   Social History  . Marital status: Widowed    Spouse name: divorced.  . Number of children: 1  . Years of education: N/A   Occupational History  . retired. still works for [employer].     Social History Main Topics  . Smoking status: Former Smoker    Packs/day: 0.50    Years: 44.00    Types: Cigarettes    Quit date: 04/07/2009  . Smokeless tobacco: Never Used  . Alcohol use No  . Drug use: No  . Sexual activity: No   Other Topics Concern  . Not on file   Social History Narrative  Pt lives alone with 2 dogs.    Family History  Problem Relation Age of Onset  . Heart disease Sister        Good Pastures Syndrome  . Gout Father   . Lung cancer Father        smoked  . Breast cancer Paternal Aunt    Review of systems complete and found to be negative unless listed in HPI.    Current Outpatient Prescriptions  Medication Sig Dispense Refill  . acetaminophen (TYLENOL) 325 MG tablet Take 2 tablets (650 mg total) by mouth every 4 (four) hours as needed for headache or mild  pain.    Amy [name] Kitchen atorvastatin (LIPITOR) 80 MG tablet Take 1 tablet (80 mg total) by mouth daily with lunch. 90 tablet 3  . Cholecalciferol (VITAMIN D) 2000 UNITS CAPS Take 2,000 Units by mouth daily.     . colchicine 0.6 MG tablet Take 0.6 mg by mouth daily.     . cyclobenzaprine (FLEXERIL) 5 MG tablet Take 1 tablet (5 mg total) by mouth 3 (three) times daily as needed for muscle spasms. 10 tablet 0  . ezetimibe (ZETIA) 10 MG tablet TAKE 1 TABLET (10 MG TOTAL) BY MOUTH IN THE EVENING 90 tablet 3  . furosemide (LASIX) 20 MG tablet Take 1 tablet (20 mg total) by mouth daily. 30 tablet 6  . Glycopyrrolate-Formoterol (BEVESPI AEROSPHERE) 9-4.8 MCG/ACT AERO Inhale 1 puff into the lungs 2 (two) times daily.    Amy [name] Kitchen levothyroxine (SYNTHROID, LEVOTHROID) 100 MCG tablet Take 100 mcg by mouth daily before breakfast.    . Macitentan (OPSUMIT) 10 MG TABS Take 1 tablet (10 mg total) by mouth daily. 30 tablet 11  . metoprolol tartrate (LOPRESSOR) 25 MG tablet Take 0.5 tablets (12.5 mg total) by mouth daily as needed. 15 tablet 11  . MULTAQ 400 MG tablet TAKE 1 TABLET BY MOUTH TWICE A DAY WITH A MEAL 180 tablet 3  . Multiple Vitamin (MULTIVITAMIN) capsule Take 1 capsule by mouth daily.    Amy [name] Kitchen NITROSTAT 0.4 MG SL tablet PLACE 1 TABLET (0.4 MG TOTAL) UNDER THE TONGUE EVERY 5 (FIVE) MINUTES AS NEEDED FOR CHEST PAIN. 25 tablet 4  . pantoprazole (PROTONIX) 40 MG tablet TAKE 1 TABLET BY MOUTH DAILY 90 tablet 2  . potassium chloride SA (K-DUR,KLOR-CON) 20 MEQ tablet Take 0.5 tablets (10 mEq total) by mouth at bedtime.    . Rivaroxaban (XARELTO) 15 MG TABS tablet Take 1 tablet (15 mg total) by mouth daily with supper. 30 tablet 11  . Selexipag (UPTRAVI) 200 MCG TABS Take 600 mcg by mouth 2 (two) times daily.    Amy [name] Kitchen UNABLE TO FIND Med Name: CPAP  DME-[facility]     No current facility-administered medications for this encounter.    BP (!) 117/59   Pulse 65   Wt 189 lb (85.7 kg)   SpO2 98% Comment: on 2L of O2  BMI 33.48 kg/m     General: NAD Neck: No JVD, no thyromegaly or thyroid nodule.  Lungs: Clear to auscultation bilaterally with normal respiratory effort. CV: Nondisplaced PMI.  Heart regular S1/S2, no S3/S4, no murmur.  No peripheral edema.  No carotid bruit.  Normal pedal pulses.  Abdomen: Soft, nontender, no hepatosplenomegaly, no distention.  Skin: Intact without lesions or rashes.  Neurologic: Alert and oriented x 3.  Psych: Normal affect. Extremities: No clubbing or cyanosis.  HEENT: Normal.   Assessment/Plan: 1. Chronic diastolic CHF: Echo with EF 65-70%.  There is also likely a component  of RV failure from significant pulmonary hypertension.  NYHA II-III symptoms.  Volume status stable on exam.   - Continue lasix 20 mg daily and potassium 10 meq daily. BMET today. 2. Apical hypertrophic cardiomyopathy: Cardiac MRI in 7/16 showed mid-apical LV hypertrophy with vigorous contraction. Now off metoprolol with slow HR.   3. Atrial fibrillation:  Remains in NSR on Multaq.  Per Dr Rayann Heman, would not re-do her ablation.  - Continue Xarelto.  No bleeding.   4. Pulmonary hypertension: RHC in 3/18 showed moderate pulmonary arterial hypertension with elevated PVR (7.3 WU by Fick, 9.6 WU by thermo). She has COPD but I do not think this plays a large role in the pulmonary hypertension.  PFTs in 3/18 showed minimal obstruction and restriction, there was significantly decreased DLCO, this may be due to pulmonary vascular disease.  OSA may play a role but not a large one. Concerned for group 1 PAH.  V/Q scan did not suggest chronic PEs in 3/18.  RF borderline elevated (probably not significant), HIV negative, Anti-SCL70 negative, ANA negative.  Significant improvement today on 6 minute walk.  - Continue Opsumit 10 mg daily.   - She did not tolerate Adcirca.   - She has started Selexipag and will continue to titrate up.     [name], MD  01/19/2017

## 2017-01-20 ENCOUNTER — Encounter (HOSPITAL_COMMUNITY)
Admission: RE | Admit: 2017-01-20 | Discharge: 2017-01-20 | Disposition: A | Discharge status: Home / Self Care | Payer: Medicare Other | Source: Ambulatory Visit | Attending: Cardiology | Admitting: Cardiology

## 2017-01-20 VITALS — Wt 186.3 lb

## 2017-01-20 DIAGNOSIS — Z7901 Long term (current) use of anticoagulants: Secondary | ICD-10-CM | POA: Diagnosis not present

## 2017-01-20 DIAGNOSIS — I272 Pulmonary hypertension, unspecified: Secondary | ICD-10-CM | POA: Diagnosis not present

## 2017-01-20 DIAGNOSIS — N183 Chronic kidney disease, stage 3 (moderate): Secondary | ICD-10-CM | POA: Diagnosis not present

## 2017-01-20 DIAGNOSIS — I5032 Chronic diastolic (congestive) heart failure: Secondary | ICD-10-CM | POA: Diagnosis not present

## 2017-01-20 DIAGNOSIS — I2510 Atherosclerotic heart disease of native coronary artery without angina pectoris: Secondary | ICD-10-CM | POA: Diagnosis not present

## 2017-01-20 DIAGNOSIS — Z79899 Other long term (current) drug therapy: Secondary | ICD-10-CM | POA: Diagnosis not present

## 2017-01-20 NOTE — Progress Notes (Signed)
Daily Session Note  Patient Details  Name: Amy [name] MRN: [id number] Date of Birth: 03-25-1946 Referring Provider:     Pulmonary Rehab Walk Test from 11/11/2016 in [place]  Referring Provider  Dr. Aundra Dubin      Encounter Date: 01/20/2017  Check In:     Session Check In - 01/20/17 1329      Check-In   Location [facility]   Staff Present Su Hilt, MS, ACSM RCEP, Exercise Physiologist;Lisa Ysidro Evert, RN;Portia Rollene Rotunda, RN, BSN   Supervising physician immediately available to respond to emergencies [facility] immediately available   Physician(s) Dr. Verlon Au   Medication changes reported     No   Fall or balance concerns reported    No   Tobacco Cessation No Change   Warm-up and Cool-down Performed as group-led instruction   Resistance Training Performed Yes   VAD Patient? No     Pain Assessment   Currently in Pain? No/denies   Multiple Pain Sites No      Capillary Blood Glucose: No results found for this or any previous visit (from the past 24 hour(s)).      Exercise Prescription Changes - 01/20/17 1500      Response to Exercise   Blood Pressure (Admit) 108/60   Blood Pressure (Exercise) 122/56   Blood Pressure (Exit) 96/56   Heart Rate (Admit) 67 bpm   Heart Rate (Exercise) 77 bpm   Heart Rate (Exit) 67 bpm   Oxygen Saturation (Admit) 88 %   Oxygen Saturation (Exercise) 89 %   Oxygen Saturation (Exit) 94 %   Rating of Perceived Exertion (Exercise) 13   Perceived Dyspnea (Exercise) 1   Duration Progress to 45 minutes of aerobic exercise without signs/symptoms of physical distress   Intensity THRR unchanged     Progression   Progression Continue to progress workloads to maintain intensity without signs/symptoms of physical distress.     Resistance Training   Training Prescription Yes   Weight orange bANDS   Reps 10-15   Time 10 Minutes     Oxygen   Oxygen Continuous   Liters 6     Recumbant Bike   Level 3   Minutes 17     NuStep   Level 5   Minutes 17   METs 1.7     Track   Laps 5   Minutes 17      History  Smoking Status  . Former Smoker  . Packs/day: 0.50  . Years: 44.00  . Types: Cigarettes  . Quit date: 04/07/2009  Smokeless Tobacco  . Never Used    Goals Met:  Exercise tolerated well No report of cardiac concerns or symptoms Strength training completed today  Goals Unmet:  Not Applicable  Comments: Service time is from 1:30p to 3:30p    Dr. Rush Farmer is Medical Director for Pulmonary Rehab at [facility].

## 2017-01-22 ENCOUNTER — Encounter (HOSPITAL_COMMUNITY)
Admission: RE | Admit: 2017-01-22 | Discharge: 2017-01-22 | Disposition: A | Discharge status: Home / Self Care | Payer: Medicare Other | Source: Ambulatory Visit | Attending: Cardiology | Admitting: Cardiology

## 2017-01-22 ENCOUNTER — Ambulatory Visit (INDEPENDENT_AMBULATORY_CARE_PROVIDER_SITE_OTHER): Payer: Medicare Other | Admitting: Internal Medicine

## 2017-01-22 ENCOUNTER — Encounter: Payer: Self-pay | Admitting: Internal Medicine

## 2017-01-22 VITALS — BP 98/56 | HR 67 | Ht 63.0 in | Wt 187.6 lb

## 2017-01-22 VITALS — Wt 188.1 lb

## 2017-01-22 DIAGNOSIS — Z7901 Long term (current) use of anticoagulants: Secondary | ICD-10-CM | POA: Diagnosis not present

## 2017-01-22 DIAGNOSIS — I2510 Atherosclerotic heart disease of native coronary artery without angina pectoris: Secondary | ICD-10-CM

## 2017-01-22 DIAGNOSIS — N183 Chronic kidney disease, stage 3 (moderate): Secondary | ICD-10-CM | POA: Diagnosis not present

## 2017-01-22 DIAGNOSIS — I270 Primary pulmonary hypertension: Secondary | ICD-10-CM | POA: Diagnosis not present

## 2017-01-22 DIAGNOSIS — Z79899 Other long term (current) drug therapy: Secondary | ICD-10-CM | POA: Diagnosis not present

## 2017-01-22 DIAGNOSIS — I5032 Chronic diastolic (congestive) heart failure: Secondary | ICD-10-CM | POA: Diagnosis not present

## 2017-01-22 DIAGNOSIS — I272 Pulmonary hypertension, unspecified: Secondary | ICD-10-CM | POA: Diagnosis not present

## 2017-01-22 NOTE — Progress Notes (Signed)
Daily Session Note  Patient Details  Name: Amy [name] MRN: [id number] Date of Birth: 26-May-1945 Referring Provider:     Pulmonary Rehab Walk Test from 11/11/2016 in [place]  Referring Provider  Dr. Aundra Dubin      Encounter Date: 01/22/2017  Check In:     Session Check In - 01/22/17 1334      Check-In   Location [facility]   Staff Present Su Hilt, MS, ACSM RCEP, Exercise Physiologist;Bela Bonaparte Ysidro Evert, RN;Portia Rollene Rotunda, RN, BSN   Supervising physician immediately available to respond to emergencies [facility] immediately available   Physician(s) Dr. Horris Latino   Medication changes reported     No   Fall or balance concerns reported    No   Tobacco Cessation No Change   Warm-up and Cool-down Performed as group-led instruction   Resistance Training Performed Yes   VAD Patient? No     Pain Assessment   Currently in Pain? No/denies   Multiple Pain Sites No      Capillary Blood Glucose: No results found for this or any previous visit (from the past 24 hour(s)).      Exercise Prescription Changes - 01/22/17 1500      Response to Exercise   Blood Pressure (Admit) 92/54   Blood Pressure (Exercise) 116/64   Blood Pressure (Exit) 110/70   Heart Rate (Admit) 63 bpm   Heart Rate (Exercise) 77 bpm   Heart Rate (Exit) 65 bpm   Oxygen Saturation (Admit) 90 %   Oxygen Saturation (Exercise) 90 %   Oxygen Saturation (Exit) 93 %   Rating of Perceived Exertion (Exercise) 14   Perceived Dyspnea (Exercise) 2   Duration Progress to 45 minutes of aerobic exercise without signs/symptoms of physical distress   Intensity THRR unchanged     Progression   Progression Continue to progress workloads to maintain intensity without signs/symptoms of physical distress.     Resistance Training   Training Prescription Yes   Weight orange bands   Reps 10-15   Time 10 Minutes     Recumbant Bike   Level 3   Minutes 17     NuStep   Level 5   Minutes 17   METs 1.6      History  Smoking Status  . Former Smoker  . Packs/day: 0.50  . Years: 44.00  . Types: Cigarettes  . Quit date: 04/07/2009  Smokeless Tobacco  . Never Used    Goals Met:  Exercise tolerated well No report of cardiac concerns or symptoms Strength training completed today  Goals Unmet:  Not Applicable  Comments: Service time is from 1330 to 1520    Dr. Rush Farmer is Medical Director for Pulmonary Rehab at [facility].

## 2017-01-22 NOTE — Patient Instructions (Addendum)
Medication Instructions:  Your physician recommends that you continue on your current medications as directed. Please refer to the Current Medication list given to you today.  -- If you need a refill on your cardiac medications before your next appointment, please call your pharmacy. --  Labwork: None ordered  Testing/Procedures: None ordered  Follow-Up: Your physician wants you to follow-up in: 6 months with [facility] and 1 year with Dr. Caryl Comes.  You will receive a reminder letter in the mail two months in advance. If you don't receive a letter, please call our office to schedule the follow-up appointment.  Thank you for choosing [facility]!!    [phone]  Any Other Special Instructions Will Be Listed Below (If Applicable).

## 2017-01-22 NOTE — Progress Notes (Signed)
Patient Care Team: Rankins, Bill Salinas, MD as PCP - General (Family Medicine) Tobi Bastos, RN as [facility]   HPI  Amy [name] is a 71 y.o. female Seen in follow for the apical variant of hypertrophic cardiomyopathy in conjunction with atrial fibrillation and severe left atrial enlargement. She underwent catheter ablation by Dr. Greggory Brandy January 17 and was last seen by him 8/17 at which time she was having no interval symptomatic atrial fibrillation  She has been managed with dronaderone  She has one daughter and a sister. Her sister died years ago in the context of complex immunological disease. She has some kind of heart condition.    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 They're infrequent recurrences.  She is diagnosed with pulmonary hypertension is on chronic hospitalization is under the care of Dr. DM she is struggling with lightheadedness as a consequence of her new medications    Past Medical History:  Diagnosis Date  . Apical variant hypertrophic cardiomyopathy ([place])    Diagnosed by echo and ECG 11/13  . Atypical atrial flutter ([place])   . CAD (coronary artery disease)    a. LHC in 10/2011 demonstrated non-obstructive disease and an 80% lesion in a small D1 treated medically.   . CHF (congestive heart failure) ([place])   . Chronic diastolic heart failure ([place])   . CKD (chronic kidney disease), stage III ([place])   . COPD (chronic obstructive pulmonary disease) ([place])   . Dizziness    had PT for being off balance - in approx. 2012  . GERD (gastroesophageal reflux disease)   . Hyperlipidemia   . Hypertension   . Hypothyroid   . Lumbar disc disease   . Macular degeneration   . OSA (obstructive sleep apnea)   . Persistent atrial fibrillation ([place])       . Pulmonary hypertension ([place])    a. [place] 05/2013 showed mild PAH with normal PCWP and RA pressure, could be related to  OSA and low oxygen saturation.   . Refusal of blood transfusions as patient is Jehovah's Witness   . Sinus bradycardia     Past Surgical History:  Procedure Laterality Date  . CARDIAC CATHETERIZATION     normal coronary arteries  . CARDIAC CATHETERIZATION N/A 10/31/2014   Procedure: Left Heart Cath and Coronary Angiography;  Surgeon: Leonie Man, MD;  Location: [facility];  Service: Cardiovascular;  Laterality: N/A;  . CATARACT EXTRACTION Bilateral   . CESAREAN SECTION  1983  . ELECTROPHYSIOLOGIC STUDY N/A 04/24/2015   Procedure: Atrial Fibrillation Ablation;  Surgeon: [name], MD;  Location: [facility];  Service: Cardiovascular;  Laterality: N/A;  . RIGHT HEART CATH N/A 06/25/2016   Procedure: Right Heart Cath;  Surgeon: Sherren Mocha, MD;  Location: [facility];  Service: Cardiovascular;  Laterality: N/A;  . RIGHT HEART CATHETERIZATION N/A 06/01/2013   Procedure: RIGHT HEART CATH;  Surgeon: Larey Dresser, MD;  Location: [facility] CATH LAB;  Service: Cardiovascular;  Laterality: N/A;  . TEE WITHOUT CARDIOVERSION N/A 04/23/2015   Procedure: TRANSESOPHAGEAL ECHOCARDIOGRAM (TEE);  Surgeon: Josue Hector, MD;  Location: [facility] ENDOSCOPY;  Service: Cardiovascular;  Laterality: N/A;    Current Outpatient Prescriptions  Medication Sig Dispense Refill  . acetaminophen (TYLENOL) 325 MG tablet Take 2 tablets (650 mg total) by mouth every 4 (four) hours as needed for headache or mild  pain.    . atorvastatin (LIPITOR) 80 MG tablet Take 1 tablet (80 mg total) by mouth daily with lunch. 90 tablet 3  . Cholecalciferol (VITAMIN D) 2000 UNITS CAPS Take 2,000 Units by mouth daily.     . colchicine 0.6 MG tablet Take 0.6 mg by mouth daily.     . cyclobenzaprine (FLEXERIL) 5 MG tablet Take 1 tablet (5 mg total) by mouth 3 (three) times daily as needed for muscle spasms. 10 tablet 0  . ezetimibe (ZETIA) 10 MG tablet TAKE 1 TABLET (10 MG TOTAL) BY MOUTH IN THE EVENING 90 tablet 3  .  furosemide (LASIX) 20 MG tablet Take 1 tablet (20 mg total) by mouth daily. 30 tablet 6  . Glycopyrrolate-Formoterol (BEVESPI AEROSPHERE) 9-4.8 MCG/ACT AERO Inhale 1 puff into the lungs 2 (two) times daily.    Marland Kitchen levothyroxine (SYNTHROID, LEVOTHROID) 100 MCG tablet Take 100 mcg by mouth daily before breakfast.    . Macitentan (OPSUMIT) 10 MG TABS Take 1 tablet (10 mg total) by mouth daily. 30 tablet 11  . metoprolol tartrate (LOPRESSOR) 25 MG tablet Take 0.5 tablets (12.5 mg total) by mouth daily as needed. 15 tablet 11  . MULTAQ 400 MG tablet TAKE 1 TABLET BY MOUTH TWICE A DAY WITH A MEAL 180 tablet 3  . Multiple Vitamin (MULTIVITAMIN) capsule Take 1 capsule by mouth daily.    . pantoprazole (PROTONIX) 40 MG tablet TAKE 1 TABLET BY MOUTH DAILY 90 tablet 2  . potassium chloride SA (K-DUR,KLOR-CON) 20 MEQ tablet Take 0.5 tablets (10 mEq total) by mouth at bedtime.    . Rivaroxaban (XARELTO) 15 MG TABS tablet Take 1 tablet (15 mg total) by mouth daily with supper. 30 tablet 11  . Selexipag (UPTRAVI) 200 MCG TABS Take 600 mcg by mouth 2 (two) times daily.    Marland Kitchen UNABLE TO FIND Med Name: CPAP  DME-[facility]     No current facility-administered medications for this visit.     Allergies  Allergen Reactions  . Amiodarone     Dyspnea - felt to be 2/2 amio lung toxicity Has tolerated Omnipaque   . Zyban [Bupropion] Itching      Review of Systems negative except from HPI and PMH  Physical Exam BP (!) 98/56   Pulse 67   Ht _0  (1.6 m)   Wt 187 lb 9.6 oz (85.1 kg)   SpO2 92%   BMI 33.23 kg/m  Well developed and nourished in no acute distress HENT normal Neck supple with JVP-flat Carotids brisk and full without bruits Clear Regular rate and rhythm, no murmurs or gallops Abd-soft with active BS without hepatomegaly No Clubbing cyanosis edema Skin-warm and dry A & Oriented  Grossly normal sensory and motor function  ECG from 01/19/2017 sinus rhythm at 6213/10/47 LVH with repolarization  abnormalities   Assessment and  Plan  Apical HCM  Atrial fibrillation status post ablation  High Risk Medication Surveillance-dronaderone  Dyspnea on exertion  COPD/Pulm Htn on O2 therapy    Some interval atrial arrhythmias relatively brief tolerating dronaderone  On Anticoagulation;  No bleeding issues      Current medicines are reviewed at length with the patient today .  The patient does not have concerns regarding medicines.

## 2017-01-23 ENCOUNTER — Ambulatory Visit: Payer: Medicare Other | Admitting: Internal Medicine

## 2017-01-26 NOTE — Progress Notes (Signed)
Pulmonary Individual Treatment Plan  Patient Details  Name: Amy [name] MRN: [id number] Date of Birth: 06/03/1945 Referring Provider:     Pulmonary Rehab Walk Test from 11/11/2016 in [place]  Referring Provider  Dr. Aundra Dubin      Initial Encounter Date:    Pulmonary Rehab Walk Test from 11/11/2016 in [place]  Date  11/11/16  Referring Provider  Dr. Aundra Dubin      Visit Diagnosis: Pulmonary hypertension ([place])  Patient's Home Medications on Admission:   Current Outpatient Prescriptions:  .  acetaminophen (TYLENOL) 325 MG tablet, Take 2 tablets (650 mg total) by mouth every 4 (four) hours as needed for headache or mild pain., Disp: , Rfl:  .  atorvastatin (LIPITOR) 80 MG tablet, Take 1 tablet (80 mg total) by mouth daily with lunch., Disp: 90 tablet, Rfl: 3 .  Cholecalciferol (VITAMIN D) 2000 UNITS CAPS, Take 2,000 Units by mouth daily. , Disp: , Rfl:  .  colchicine 0.6 MG tablet, Take 0.6 mg by mouth daily. , Disp: , Rfl:  .  cyclobenzaprine (FLEXERIL) 5 MG tablet, Take 1 tablet (5 mg total) by mouth 3 (three) times daily as needed for muscle spasms., Disp: 10 tablet, Rfl: 0 .  ezetimibe (ZETIA) 10 MG tablet, TAKE 1 TABLET (10 MG TOTAL) BY MOUTH IN THE EVENING, Disp: 90 tablet, Rfl: 3 .  furosemide (LASIX) 20 MG tablet, Take 1 tablet (20 mg total) by mouth daily., Disp: 30 tablet, Rfl: 6 .  Glycopyrrolate-Formoterol (BEVESPI AEROSPHERE) 9-4.8 MCG/ACT AERO, Inhale 1 puff into the lungs 2 (two) times daily., Disp: , Rfl:  .  levothyroxine (SYNTHROID, LEVOTHROID) 100 MCG tablet, Take 100 mcg by mouth daily before breakfast., Disp: , Rfl:  .  Macitentan (OPSUMIT) 10 MG TABS, Take 1 tablet (10 mg total) by mouth daily., Disp: 30 tablet, Rfl: 11 .  metoprolol tartrate (LOPRESSOR) 25 MG tablet, Take 0.5 tablets (12.5 mg total) by mouth daily as needed., Disp: 15 tablet, Rfl: 11 .  MULTAQ 400 MG tablet, TAKE 1 TABLET BY  MOUTH TWICE A DAY WITH A MEAL, Disp: 180 tablet, Rfl: 3 .  Multiple Vitamin (MULTIVITAMIN) capsule, Take 1 capsule by mouth daily., Disp: , Rfl:  .  pantoprazole (PROTONIX) 40 MG tablet, TAKE 1 TABLET BY MOUTH DAILY, Disp: 90 tablet, Rfl: 2 .  potassium chloride SA (K-DUR,KLOR-CON) 20 MEQ tablet, Take 0.5 tablets (10 mEq total) by mouth at bedtime., Disp: , Rfl:  .  Rivaroxaban (XARELTO) 15 MG TABS tablet, Take 1 tablet (15 mg total) by mouth daily with supper., Disp: 30 tablet, Rfl: 11 .  Selexipag (UPTRAVI) 200 MCG TABS, Take 600 mcg by mouth 2 (two) times daily., Disp: , Rfl:  .  UNABLE TO FIND, Med Name: CPAP  DME-[facility], Disp: , Rfl:   Past Medical History: Past Medical History:  Diagnosis Date  . Apical variant hypertrophic cardiomyopathy ([place])    Diagnosed by echo and ECG 11/13  . Atypical atrial flutter ([place])   . CAD (coronary artery disease)    a. LHC in 10/2011 demonstrated non-obstructive disease and an 80% lesion in a small D1 treated medically.   . CHF (congestive heart failure) ([place])   . Chronic diastolic heart failure ([place])   . CKD (chronic kidney disease), stage III ([place])   . COPD (chronic obstructive pulmonary disease) ([place])   . Dizziness    had PT for being off balance - in approx. 2012  . GERD (  gastroesophageal reflux disease)   . Hyperlipidemia   . Hypertension   . Hypothyroid   . Lumbar disc disease   . Macular degeneration   . OSA (obstructive sleep apnea)   . Persistent atrial fibrillation ([place])       . Pulmonary hypertension ([place])    a. [place] 05/2013 showed mild PAH with normal PCWP and RA pressure, could be related to OSA and low oxygen saturation.   . Refusal of blood transfusions as patient is Jehovah's Witness   . Sinus bradycardia     Tobacco Use: History  Smoking Status  . Former Smoker  . Packs/day: 0.50  . Years: 44.00  . Types: Cigarettes  . Quit date: 04/07/2009  Smokeless Tobacco  . Never Used    Labs: Recent Review Flowsheet Data    Labs  for ITP Cardiac and Pulmonary Rehab Latest Ref Rng & Units 09/27/2014 10/28/2014 06/25/2016 08/29/2016 09/12/2016   Cholestrol 100 - 199 mg/dL 137 - - 126 105   LDLCALC 0 - 99 mg/dL 72 - - 59 49   LDLDIRECT mg/dL - - - - -   HDL >39 mg/dL 49.50 - - 44 41   Trlycerides 0 - 149 mg/dL 76.0 - - 117 76   Hemoglobin A1c 4.8 - 5.6 % - 6.1(H) - - -   PHART 7.350 - 7.450 - - - - -   PCO2ART 35.0 - 45.0 mmHg - - - - -   HCO3 20.0 - 28.0 mmol/L - - 21.4 - -   TCO2 0 - 100 mmol/L - - 23 - -   ACIDBASEDEF 0.0 - 2.0 mmol/L - - 3.0(H) - -   O2SAT % - - 57.0 - -      Capillary Blood Glucose: No results found for: GLUCAP   Pulmonary Assessment Scores:     Pulmonary Assessment Scores    Row Name 11/11/16 1646         ADL UCSD   ADL Phase Entry       mMRC Score   mMRC Score 4        Pulmonary Function Assessment:     Pulmonary Function Assessment - 11/07/16 1502      Breath   Bilateral Breath Sounds Clear   Shortness of Breath Yes;Limiting activity      Exercise Target Goals:    Exercise Program Goal: Individual exercise prescription set with THRR, safety & activity barriers. Participant demonstrates ability to understand and report RPE using BORG scale, to self-measure pulse accurately, and to acknowledge the importance of the exercise prescription.  Exercise Prescription Goal: Starting with aerobic activity 30 plus minutes a day, 3 days per week for initial exercise prescription. Provide home exercise prescription and guidelines that participant acknowledges understanding prior to discharge.  Activity Barriers & Risk Stratification:   6 Minute Walk:     6 Minute Walk    Row Name 11/11/16 1643         6 Minute Walk   Phase Initial     Distance 730 feet     Walk Time 6 minutes     # of Rest Breaks 0     MPH 1.38     METS 2.07     RPE 13     Perceived Dyspnea  3     Symptoms Yes (comment)     Comments wheelchair     Resting HR 66 bpm     Resting BP 110/73     Max  Ex.  HR 89 bpm     Max Ex. BP 113/74       Interval HR   Baseline HR (retired) 66     1 Minute HR 72     2 Minute HR 90     3 Minute HR 89     4 Minute HR 87     5 Minute HR 89     6 Minute HR 86     2 Minute Post HR 79     Interval Heart Rate? Yes       Interval Oxygen   Interval Oxygen? Yes     Baseline Oxygen Saturation % 85 %     Resting Liters of Oxygen 3 L     1 Minute Oxygen Saturation % 90 %     1 Minute Liters of Oxygen 4 L     2 Minute Oxygen Saturation % 89 %     2 Minute Liters of Oxygen 4 L     3 Minute Oxygen Saturation % 88 %     3 Minute Liters of Oxygen 4 L     4 Minute Oxygen Saturation % 89 %     4 Minute Liters of Oxygen 4 L     5 Minute Oxygen Saturation % 88 %     5 Minute Liters of Oxygen 4 L     6 Minute Oxygen Saturation % 85 %     6 Minute Liters of Oxygen 4 L     2 Minute Post Oxygen Saturation % 92 %     2 Minute Post Liters of Oxygen 4 L        Oxygen Initial Assessment:     Oxygen Initial Assessment - 11/11/16 1645      Initial 6 min Walk   Oxygen Used Continuous;E-Tanks   Liters per minute 4   Resting Oxygen Saturation  94 %   Exercise Oxygen Saturation  during 6 min walk 85 %     Program Oxygen Prescription   Program Oxygen Prescription Continuous;E-Tanks   Liters per minute --  4-6   Comments needs depending on activity      Oxygen Re-Evaluation:     Oxygen Re-Evaluation    Row Name 12/03/16 2107 12/26/16 1352 01/26/17 0812         Program Oxygen Prescription   Program Oxygen Prescription Continuous;E-Tanks Continuous;E-Tanks Continuous;E-Tanks     Comments needs 4 to 6 liters depending on activity needs 4 to 6 liters depending on activity needs 4 to 6 liters depending on activity       Home Oxygen   Home Oxygen Device Home Concentrator;E-Tanks Home Concentrator;E-Tanks Home Concentrator;E-Tanks     Sleep Oxygen Prescription CPAP CPAP CPAP     Liters per minute _0 Home Exercise Oxygen Prescription Continuous  Continuous Continuous     Liters per minute -  4 to 6 -  4-6  -     Home at Rest Exercise Oxygen Prescription None Continuous Continuous     Liters per minute _1 Compliance with Home Oxygen Use Yes Yes Yes       Goals/Expected Outcomes   Short Term Goals To learn and understand importance of monitoring SPO2 with pulse oximeter and demonstrate accurate use of the pulse oximeter.;To learn and understand importance of maintaining oxygen saturations>88%;To learn and exhibit compliance with exercise, home and travel O2 prescription;To learn and demonstrate proper pursed lip breathing  techniques or other breathing techniques.;To learn and demonstrate proper use of respiratory medications To learn and understand importance of monitoring SPO2 with pulse oximeter and demonstrate accurate use of the pulse oximeter.;To learn and understand importance of maintaining oxygen saturations>88%;To learn and exhibit compliance with exercise, home and travel O2 prescription;To learn and demonstrate proper pursed lip breathing techniques or other breathing techniques.;To learn and demonstrate proper use of respiratory medications To learn and understand importance of monitoring SPO2 with pulse oximeter and demonstrate accurate use of the pulse oximeter.;To learn and understand importance of maintaining oxygen saturations>88%;To learn and exhibit compliance with exercise, home and travel O2 prescription;To learn and demonstrate proper pursed lip breathing techniques or other breathing techniques.;To learn and demonstrate proper use of respiratory medications     Long  Term Goals Exhibits compliance with exercise, home and travel O2 prescription;Verbalizes importance of monitoring SPO2 with pulse oximeter and return demonstration;Maintenance of O2 saturations>88%;Exhibits proper breathing techniques, such as pursed lip breathing or other method taught during program session;Compliance with respiratory medication  Exhibits compliance with exercise, home and travel O2 prescription;Verbalizes importance of monitoring SPO2 with pulse oximeter and return demonstration;Maintenance of O2 saturations>88%;Exhibits proper breathing techniques, such as pursed lip breathing or other method taught during program session;Compliance with respiratory medication Exhibits compliance with exercise, home and travel O2 prescription;Verbalizes importance of monitoring SPO2 with pulse oximeter and return demonstration;Maintenance of O2 saturations>88%;Exhibits proper breathing techniques, such as pursed lip breathing or other method taught during program session;Compliance with respiratory medication     Comments  - patient verbalizes compliance with home oxygen use patient verbalizes compliance with home oxygen use     Goals/Expected Outcomes  - see above see above        Oxygen Discharge (Final Oxygen Re-Evaluation):     Oxygen Re-Evaluation - 01/26/17 0812      Program Oxygen Prescription   Program Oxygen Prescription Continuous;E-Tanks   Comments needs 4 to 6 liters depending on activity     Home Oxygen   Home Oxygen Device Home Concentrator;E-Tanks   Sleep Oxygen Prescription CPAP   Liters per minute 3   Home Exercise Oxygen Prescription Continuous   Home at Rest Exercise Oxygen Prescription Continuous   Liters per minute 3   Compliance with Home Oxygen Use Yes     Goals/Expected Outcomes   Short Term Goals To learn and understand importance of monitoring SPO2 with pulse oximeter and demonstrate accurate use of the pulse oximeter.;To learn and understand importance of maintaining oxygen saturations>88%;To learn and exhibit compliance with exercise, home and travel O2 prescription;To learn and demonstrate proper pursed lip breathing techniques or other breathing techniques.;To learn and demonstrate proper use of respiratory medications   Long  Term Goals Exhibits compliance with exercise, home and travel O2  prescription;Verbalizes importance of monitoring SPO2 with pulse oximeter and return demonstration;Maintenance of O2 saturations>88%;Exhibits proper breathing techniques, such as pursed lip breathing or other method taught during program session;Compliance with respiratory medication   Comments patient verbalizes compliance with home oxygen use   Goals/Expected Outcomes see above      Initial Exercise Prescription:     Initial Exercise Prescription - 11/11/16 1600      Date of Initial Exercise RX and Referring Provider   Date 11/11/16   Referring Provider Dr. Aundra Dubin     Oxygen   Oxygen Continuous   Liters --  4-6     Recumbant Bike   Level 1   Minutes 17     NuStep  Level 1   Minutes 17   METs 1.3     Track   Laps 4   Minutes 17     Prescription Details   Frequency (times per week) 2   Duration Progress to 45 minutes of aerobic exercise without signs/symptoms of physical distress     Intensity   THRR 40-80% of Max Heartrate 60-119   Ratings of Perceived Exertion 11-13   Perceived Dyspnea 0-4     Progression   Progression Continue to progress workloads to maintain intensity without signs/symptoms of physical distress.     Resistance Training   Training Prescription Yes   Weight orange bands   Reps 10-15      Perform Capillary Blood Glucose checks as needed.  Exercise Prescription Changes:     Exercise Prescription Changes    Row Name 11/13/16 1600 11/18/16 1500 11/20/16 1500 11/27/16 1500 12/02/16 1545     Response to Exercise   Blood Pressure (Admit) 98/54 110/70 130/68 96/54 102/58   Blood Pressure (Exercise) 106/60 132/80 118/56 128/70 100/62   Blood Pressure (Exit) 102/58 104/64 110/70 106/70 102/62   Heart Rate (Admit) 62 bpm 54 bpm 61 bpm 63 bpm 65 bpm   Heart Rate (Exercise) 73 bpm 72 bpm 76 bpm 77 bpm 73 bpm   Heart Rate (Exit) 63 bpm 54 bpm 63 bpm 60 bpm 64 bpm   Oxygen Saturation (Admit) 89 % 88 %  increased O2 from 3L to 4L SaO2 inc to  90%  92 % 91 % 94 %   Oxygen Saturation (Exercise) 88 % 88 %  inc O2 from 4L to 6L, SaO2 inc to 94% 87 %  O2 increased to 6L sat up to 89 90 % 90 %   Oxygen Saturation (Exit) 93 % 98 %  6L O2 97 % 95 % 91 %   Rating of Perceived Exertion (Exercise) _0 Perceived Dyspnea (Exercise) _1 Duration Progress to 45 minutes of aerobic exercise without signs/symptoms of physical distress Progress to 45 minutes of aerobic exercise without signs/symptoms of physical distress Progress to 45 minutes of aerobic exercise without signs/symptoms of physical distress Progress to 45 minutes of aerobic exercise without signs/symptoms of physical distress Progress to 45 minutes of aerobic exercise without signs/symptoms of physical distress   Intensity _2      Progression   Progression Continue to progress workloads to maintain intensity without signs/symptoms of physical distress. Continue to progress workloads to maintain intensity without signs/symptoms of physical distress. Continue to progress workloads to maintain intensity without signs/symptoms of physical distress. Continue to progress workloads to maintain intensity without signs/symptoms of physical distress. Continue to progress workloads to maintain intensity without signs/symptoms of physical distress.     Resistance Training   Training Prescription _3    Weight _4    Reps 10-15 10-15 10-15 10-15 10-15   Time  - 10 Minutes 10 Minutes 10 Minutes 10 Minutes     Oxygen   Oxygen _5    Liters -  4-6 -  4-6 4-6 4-6 4-6     Recumbant Bike   Level  - _6 Minutes  - _7 NuStep   Level _8 - 2   Minutes 17 17  17  - 17   METs 2 1.4  -  - 1.4     Track   Laps _0 Minutes _1 Row Name  12/04/16 1600 12/16/16 1545 12/18/16 1527 12/25/16 1600 12/30/16 1526     Response to Exercise   Blood Pressure (Admit) 82/40  Rechecked BP 112/62 94/64 104/62 92/54 88/44   Blood Pressure (Exercise) 130/70 120/60 112/60 118/62 110/50   Blood Pressure (Exit) 100/60 104/70 108/58 90/60 96/60   Heart Rate (Admit) 57 bpm 60 bpm 63 bpm 73 bpm 65 bpm   Heart Rate (Exercise) 73 bpm 74 bpm 77 bpm 82 bpm 80 bpm   Heart Rate (Exit) 60 bpm 61 bpm 61 bpm 77 bpm 65 bpm   Oxygen Saturation (Admit) 93 % 92 % 92 % 97 % 91 %   Oxygen Saturation (Exercise) 95 % 95 % 90 % 92 % 91 %   Oxygen Saturation (Exit) 90 % 91 % 94 % 95 % 92 %   Rating of Perceived Exertion (Exercise) _2 Perceived Dyspnea (Exercise) _3 Duration Progress to 45 minutes of aerobic exercise without signs/symptoms of physical distress Progress to 45 minutes of aerobic exercise without signs/symptoms of physical distress Progress to 45 minutes of aerobic exercise without signs/symptoms of physical distress Progress to 45 minutes of aerobic exercise without signs/symptoms of physical distress Progress to 45 minutes of aerobic exercise without signs/symptoms of physical distress   Intensity _4      Progression   Progression Continue to progress workloads to maintain intensity without signs/symptoms of physical distress. Continue to progress workloads to maintain intensity without signs/symptoms of physical distress. Continue to progress workloads to maintain intensity without signs/symptoms of physical distress. Continue to progress workloads to maintain intensity without signs/symptoms of physical distress. Continue to progress workloads to maintain intensity without signs/symptoms of physical distress.     Resistance Training   Training Prescription _5    Weight _6    Reps  10-15 10-15 10-15 10-15 10-15   Time 10 Minutes 10 Minutes 10 Minutes 10 Minutes 10 Minutes     Oxygen   Oxygen _7    Liters 4-6 4-6 4-6 4-6 4-6     Recumbant Bike   Level  - 4 4  - 2   Minutes  - 17 17  - 17     NuStep   Level _8 Minutes _9 METs 1.7 2 1.7 1.7 1.7     Track   Laps 8  - _10 Minutes 17  - _11 Home Exercise Plan   Plans to continue exercise at  -  - Home (comment)  -  -   Frequency  -  - Add 3 additional days to program exercise sessions.  -  -   Row Name 01/01/17 1533 01/06/17 1600 01/08/17 1500 01/13/17 1500 01/20/17 1500     Response to Exercise   Blood Pressure (Admit) 92/50 94/60 106/60 102/60 108/60   Blood Pressure (Exercise) 112/70 120/66 126/60 120/54 122/56   Blood Pressure (Exit) 96/60 95/64 112/72 100/62 96/56   Heart Rate (Admit) 59 bpm 62 bpm 58  bpm 59 bpm 67 bpm   Heart Rate (Exercise) 73 bpm 112 bpm 96 bpm 74 bpm 77 bpm   Heart Rate (Exit) 57 bpm 59 bpm 62 bpm 64 bpm 67 bpm   Oxygen Saturation (Admit) 93 % 94 % 89 % 90 % 88 %   Oxygen Saturation (Exercise) 91 % 87 %  Sat at 87% on O2 3L.O2 increased to 6L sat improved to 91. 94 %  Sat at 87% on O2 3L.O2 increased to 6L sat improved to 91. 93 % 89 %   Oxygen Saturation (Exit) 97 % 93 % 94 % 92 % 94 %   Rating of Perceived Exertion (Exercise) _0 Perceived Dyspnea (Exercise) 1  - _1 Symptoms  -  -  - 1  -   Duration Progress to 45 minutes of aerobic exercise without signs/symptoms of physical distress Progress to 45 minutes of aerobic exercise without signs/symptoms of physical distress Progress to 45 minutes of aerobic exercise without signs/symptoms of physical distress Progress to 45 minutes of aerobic exercise without signs/symptoms of physical distress Progress to 45 minutes of aerobic exercise without signs/symptoms of physical distress   Intensity THRR unchanged THRR unchanged THRR  unchanged THRR unchanged THRR unchanged     Progression   Progression Continue to progress workloads to maintain intensity without signs/symptoms of physical distress. Continue to progress workloads to maintain intensity without signs/symptoms of physical distress. Continue to progress workloads to maintain intensity without signs/symptoms of physical distress. Continue to progress workloads to maintain intensity without signs/symptoms of physical distress. Continue to progress workloads to maintain intensity without signs/symptoms of physical distress.     Resistance Training   Training Prescription _2    Weight _3    Reps 10-15 10-15 10-15 10-15 10-15   Time 10 Minutes 10 Minutes 10 Minutes 10 Minutes 10 Minutes     Oxygen   Oxygen Continuous Continuous Continuous  - Continuous   Liters 4-6 4-6 4-6  - 6     Recumbant Bike   Level  - _4 Minutes  - _5 NuStep   Level  - 4  -  - 5   Minutes  - 17  -  - 17   METs  - 2  -  - 1.7     Track   Laps _6 Minutes _7 Row Name 01/22/17 1500             Response to Exercise   Blood Pressure (Admit) 92/54       Blood Pressure (Exercise) 116/64       Blood Pressure (Exit) 110/70       Heart Rate (Admit) 63 bpm       Heart Rate (Exercise) 77 bpm       Heart Rate (Exit) 65 bpm       Oxygen Saturation (Admit) 90 %       Oxygen Saturation (Exercise) 90 %       Oxygen Saturation (Exit) 93 %       Rating of Perceived Exertion (Exercise) 14       Perceived Dyspnea (Exercise) 2       Duration Progress to 45 minutes of aerobic exercise without signs/symptoms of physical distress  Intensity THRR unchanged         Progression   Progression Continue to progress workloads to maintain intensity without signs/symptoms of physical distress.         Resistance Training   Training Prescription Yes       Weight orange bands        Reps 10-15       Time 10 Minutes         Recumbant Bike   Level 3       Minutes 17         NuStep   Level 5       Minutes 17       METs 1.6          Exercise Comments:     Exercise Comments    Row Name 12/18/16 1543           Exercise Comments home exercise completed          Exercise Goals and Review:     Exercise Goals    [place] Name 11/07/16 1447             Exercise Goals   Increase Physical Activity Yes       Intervention Provide advice, education, support and counseling about physical activity/exercise needs.;Develop an individualized exercise prescription for aerobic and resistive training based on initial evaluation findings, risk stratification, comorbidities and participant's personal goals.       Expected Outcomes Achievement of increased cardiorespiratory fitness and enhanced flexibility, muscular endurance and strength shown through measurements of functional capacity and personal statement of participant.       Increase Strength and Stamina Yes       Intervention Provide advice, education, support and counseling about physical activity/exercise needs.;Develop an individualized exercise prescription for aerobic and resistive training based on initial evaluation findings, risk stratification, comorbidities and participant's personal goals.       Expected Outcomes Achievement of increased cardiorespiratory fitness and enhanced flexibility, muscular endurance and strength shown through measurements of functional capacity and personal statement of participant.          Exercise Goals Re-Evaluation :     Exercise Goals Re-Evaluation    Row Name 12/02/16 0944 12/22/16 1407 01/24/17 1349         Exercise Goal Re-Evaluation   Exercise Goals Review Increase Strength and Stamina;Increase Physical Activity;Able to understand and use rate of perceived exertion (RPE) scale;Knowledge and understanding of Target Heart Rate Range (THRR);Understanding of  Exercise Prescription;Able to understand and use Dyspnea scale Increase Strength and Stamina;Able to understand and use Dyspnea scale;Increase Physical Activity;Able to understand and use rate of perceived exertion (RPE) scale;Knowledge and understanding of Target Heart Rate Range (THRR);Understanding of Exercise Prescription Increase Strength and Stamina;Increase Physical Activity;Able to understand and use Dyspnea scale;Able to understand and use rate of perceived exertion (RPE) scale;Knowledge and understanding of Target Heart Rate Range (THRR);Understanding of Exercise Prescription     Comments Patient has only attended four exercise sessions. Will cont. to monitor and progress as able.  Patient is progressing slowly in program. She averages a MET level of 1.7-2.0. Will cont. to monitor and progress as able.  Patient is progressing slowly in program. She averages a MET level of 1.7-2.0. Will cont. to monitor and progress as able.      Expected Outcomes Through exercising at rehab and at home, patient will increase physical strength and stamina and find ADL's easier to perform.  Through exercising at rehab and at home,  patient will increase physical strength and stamina and find ADL's easier to perform.  Through exercising at rehab and at home, patient will increase physical strength and stamina and find ADL's easier to perform.         Discharge Exercise Prescription (Final Exercise Prescription Changes):     Exercise Prescription Changes - 01/22/17 1500      Response to Exercise   Blood Pressure (Admit) 92/54   Blood Pressure (Exercise) 116/64   Blood Pressure (Exit) 110/70   Heart Rate (Admit) 63 bpm   Heart Rate (Exercise) 77 bpm   Heart Rate (Exit) 65 bpm   Oxygen Saturation (Admit) 90 %   Oxygen Saturation (Exercise) 90 %   Oxygen Saturation (Exit) 93 %   Rating of Perceived Exertion (Exercise) 14   Perceived Dyspnea (Exercise) 2   Duration Progress to 45 minutes of aerobic exercise  without signs/symptoms of physical distress   Intensity THRR unchanged     Progression   Progression Continue to progress workloads to maintain intensity without signs/symptoms of physical distress.     Resistance Training   Training Prescription Yes   Weight orange bands   Reps 10-15   Time 10 Minutes     Recumbant Bike   Level 3   Minutes 17     NuStep   Level 5   Minutes 17   METs 1.6      Nutrition:  Target Goals: Understanding of nutrition guidelines, daily intake of sodium <1525m, cholesterol <2039m calories 30% from fat and 7% or less from saturated fats, daily to have 5 or more servings of fruits and vegetables.  Biometrics:     Pre Biometrics - 11/07/16 1543      Pre Biometrics   Grip Strength 23 kg       Nutrition Therapy Plan and Nutrition Goals:     Nutrition Therapy & Goals - 01/01/17 1541      Nutrition Therapy   Diet Therapeutic Lifestyle Changes     Personal Nutrition Goals   Nutrition Goal Identify food quantities necessary to achieve wt loss of  -2# per week to a goal wt loss of 2.7-10.9 kg (6-24 lb) at graduation from pulmonary rehab.     Intervention Plan   Intervention Prescribe, educate and counsel regarding individualized specific dietary modifications aiming towards targeted core components such as weight, hypertension, lipid management, diabetes, heart failure and other comorbidities.;Nutrition handout(s) given to patient.  Pre-diabetes handout given   Expected Outcomes Short Term Goal: Understand basic principles of dietary content, such as calories, fat, sodium, cholesterol and nutrients.;Long Term Goal: Adherence to prescribed nutrition plan.      Nutrition Discharge: Rate Your Plate Scores:     Nutrition Assessments - 12/22/16 1036      Rate Your Plate Scores   Pre Score 52      Nutrition Goals Re-Evaluation:   Nutrition Goals Discharge (Final Nutrition Goals Re-Evaluation):   Psychosocial: Target Goals:  Acknowledge presence or absence of significant depression and/or stress, maximize coping skills, provide positive support system. Participant is able to verbalize types and ability to use techniques and skills needed for reducing stress and depression.  Initial Review & Psychosocial Screening:     Initial Psych Review & Screening - 11/07/16 1505      Initial Review   Current issues with None Identified     Family Dynamics   Good Support System? Yes   Concerns Recent loss of significant other   Comments lost husband 2  years ago. only married for 2 years     Barriers   Psychosocial barriers to participate in program There are no identifiable barriers or psychosocial needs.     Screening Interventions   Interventions Encouraged to exercise      Quality of Life Scores:   PHQ-9: Recent Review Flowsheet Data    Depression screen [facility] 2/9 11/07/2016 10/17/2016 10/13/2016 10/06/2016   Decreased Interest _0 Down, Depressed, Hopeless 0 0 1 1   PHQ - 2 Score _1 Altered sleeping - - 1 1   Tired, decreased energy - - 1 1   Change in appetite - - 0 0   Feeling bad or failure about yourself  - - 0 -   Trouble concentrating - - 0 0   Moving slowly or fidgety/restless - - 0 0   Suicidal thoughts - - 0 0   PHQ-9 Score - - 4 4   Difficult doing work/chores - - Not difficult at all -     Interpretation of Total Score  Total Score Depression Severity:  1-4 = Minimal depression, 5-9 = Mild depression, 10-14 = Moderate depression, 15-19 = Moderately severe depression, 20-27 = Severe depression   Psychosocial Evaluation and Intervention:     Psychosocial Evaluation - 11/07/16 1506      Psychosocial Evaluation & Interventions   Interventions Encouraged to exercise with the program and follow exercise prescription   Continue Psychosocial Services  Follow up required by staff      Psychosocial Re-Evaluation:     Psychosocial Re-Evaluation    Row Name 12/03/16 2111 12/26/16  1355 01/26/17 0814         Psychosocial Re-Evaluation   Current issues with None Identified None Identified None Identified     Comments lost husband 2 years ago and has lost contact with his children which saddens her. she was only married for 2 years. lost husband 2 years ago and has lost contact with his children which saddens her. she was only married for 2 years. lost husband 2 years ago and has lost contact with his children which saddens her. she was only married for 2 years.     Expected Outcomes patient will remain free from psychosocial barriers to participation patient will remain free from psychosocial barriers to participation patient will remain free from psychosocial barriers to participation     Interventions Encouraged to attend Pulmonary Rehabilitation for the exercise Encouraged to attend Pulmonary Rehabilitation for the exercise Encouraged to attend Pulmonary Rehabilitation for the exercise     Continue Psychosocial Services  Follow up required by staff Follow up required by staff Follow up required by staff        Psychosocial Discharge (Final Psychosocial Re-Evaluation):     Psychosocial Re-Evaluation - 01/26/17 0814      Psychosocial Re-Evaluation   Current issues with None Identified   Comments lost husband 2 years ago and has lost contact with his children which saddens her. she was only married for 2 years.   Expected Outcomes patient will remain free from psychosocial barriers to participation   Interventions Encouraged to attend Pulmonary Rehabilitation for the exercise   Continue Psychosocial Services  Follow up required by staff      Education: Education Goals: Education classes will be provided on a weekly basis, covering required topics. Participant will state understanding/return demonstration of topics presented.  Learning Barriers/Preferences:     Learning Barriers/Preferences - 11/07/16 1501  Learning Barriers/Preferences   Learning Barriers  None   Learning Preferences Group Instruction;Individual Instruction;Verbal Instruction;Written Material      Education Topics: Risk Factor Reduction:  -Group instruction that is supported by a PowerPoint presentation. Instructor discusses the definition of a risk factor, different risk factors for pulmonary disease, and how the heart and lungs work together.     Nutrition for Pulmonary Patient:  -Group instruction provided by PowerPoint slides, verbal discussion, and written materials to support subject matter. The instructor gives an explanation and review of healthy diet recommendations, which includes a discussion on weight management, recommendations for fruit and vegetable consumption, as well as protein, fluid, caffeine, fiber, sodium, sugar, and alcohol. Tips for eating when patients are short of breath are discussed.   PULMONARY REHAB OTHER RESPIRATORY from 01/22/2017 in [place]  Date  11/27/16  Educator  edna  Instruction Review Code  2- meets goals/outcomes      Pursed Lip Breathing:  -Group instruction that is supported by demonstration and informational handouts. Instructor discusses the benefits of pursed lip and diaphragmatic breathing and detailed demonstration on how to preform both.     Oxygen Safety:  -Group instruction provided by PowerPoint, verbal discussion, and written material to support subject matter. There is an overview of "What is Oxygen" and "Why do we need it".  Instructor also reviews how to create a safe environment for oxygen use, the importance of using oxygen as prescribed, and the risks of noncompliance. There is a brief discussion on traveling with oxygen and resources the patient may utilize.   PULMONARY REHAB OTHER RESPIRATORY from 01/22/2017 in [place]  Date  11/13/16  Educator  Truddie Crumble  Instruction Review Code  2- meets goals/outcomes      Oxygen Equipment:  -Group  instruction provided by [employer] Staff utilizing handouts, written materials, and equipment demonstrations.   Signs and Symptoms:  -Group instruction provided by written material and verbal discussion to support subject matter. Warning signs and symptoms of infection, stroke, and heart attack are reviewed and when to call the physician/911 reinforced. Tips for preventing the spread of infection discussed.   PULMONARY REHAB OTHER RESPIRATORY from 01/22/2017 in [place]  Date  12/25/16  Educator  rn  Instruction Review Code  2- meets goals/outcomes      Advanced Directives:  -Group instruction provided by verbal instruction and written material to support subject matter. Instructor reviews Advanced Directive laws and proper instruction for filling out document.   Pulmonary Video:  -Group video education that reviews the importance of medication and oxygen compliance, exercise, good nutrition, pulmonary hygiene, and pursed lip and diaphragmatic breathing for the pulmonary patient.   PULMONARY REHAB OTHER RESPIRATORY from 01/22/2017 in [place]  Date  01/01/17  Instruction Review Code  2- meets goals/outcomes      Exercise for the Pulmonary Patient:  -Group instruction that is supported by a PowerPoint presentation. Instructor discusses benefits of exercise, core components of exercise, frequency, duration, and intensity of an exercise routine, importance of utilizing pulse oximetry during exercise, safety while exercising, and options of places to exercise outside of rehab.     PULMONARY REHAB OTHER RESPIRATORY from 01/22/2017 in [place]  Date  12/16/16  Educator  ep  Instruction Review Code  2- meets goals/outcomes      Pulmonary Medications:  -Verbally interactive group education provided by instructor with  focus on inhaled medications and proper administration.   PULMONARY  REHAB OTHER RESPIRATORY from 01/22/2017 in [place]  Date  12/04/16  Educator  pharmacist  Instruction Review Code  2- meets goals/outcomes      Anatomy and Physiology of the Respiratory System and Intimacy:  -Group instruction provided by PowerPoint, verbal discussion, and written material to support subject matter. Instructor reviews respiratory cycle and anatomical components of the respiratory system and their functions. Instructor also reviews differences in obstructive and restrictive respiratory diseases with examples of each. Intimacy, Sex, and Sexuality differences are reviewed with a discussion on how relationships can change when diagnosed with pulmonary disease. Common sexual concerns are reviewed.   MD DAY -A group question and answer session with a medical doctor that allows participants to ask questions that relate to their pulmonary disease state.   PULMONARY REHAB OTHER RESPIRATORY from 01/22/2017 in [place]  Date  01/13/17  Educator  yacoub  Instruction Review Code  2- meets goals/outcomes      OTHER EDUCATION -Group or individual verbal, written, or video instructions that support the educational goals of the pulmonary rehab program.   Knowledge Questionnaire Score:   Core Components/Risk Factors/Patient Goals at Admission:     Personal Goals and Risk Factors at Admission - 11/07/16 1504      Core Components/Risk Factors/Patient Goals on Admission   Improve shortness of breath with ADL's Yes   Intervention Provide education, individualized exercise plan and daily activity instruction to help decrease symptoms of SOB with activities of daily living.   Expected Outcomes Short Term: Achieves a reduction of symptoms when performing activities of daily living.   Develop more efficient breathing techniques such as purse lipped breathing and diaphragmatic breathing; and practicing self-pacing with  activity Yes   Intervention Provide education, demonstration and support about specific breathing techniuqes utilized for more efficient breathing. Include techniques such as pursed lipped breathing, diaphragmatic breathing and self-pacing activity.   Expected Outcomes Short Term: Participant will be able to demonstrate and use breathing techniques as needed throughout daily activities.   Increase knowledge of respiratory medications and ability to use respiratory devices properly  Yes   Intervention Provide education and demonstration as needed of appropriate use of medications, inhalers, and oxygen therapy.   Expected Outcomes Short Term: Achieves understanding of medications use. Understands that oxygen is a medication prescribed by physician. Demonstrates appropriate use of inhaler and oxygen therapy.      Core Components/Risk Factors/Patient Goals Review:      Goals and Risk Factor Review    Row Name 12/03/16 2109 12/26/16 1354 01/26/17 0813         Core Components/Risk Factors/Patient Goals Review   Personal Goals Review Improve shortness of breath with ADL's;Increase knowledge of respiratory medications and ability to use respiratory devices properly.;Develop more efficient breathing techniques such as purse lipped breathing and diaphragmatic breathing and practicing self-pacing with activity. Improve shortness of breath with ADL's;Increase knowledge of respiratory medications and ability to use respiratory devices properly.;Develop more efficient breathing techniques such as purse lipped breathing and diaphragmatic breathing and practicing self-pacing with activity. Improve shortness of breath with ADL's;Increase knowledge of respiratory medications and ability to use respiratory devices properly.;Develop more efficient breathing techniques such as purse lipped breathing and diaphragmatic breathing and practicing self-pacing with activity.     Review patient is beginning to use plb  technique without prompting. she has not had any improvement in her shortness  of breath at this point in rehab however she has missed several sessions. patient is beginning to use plb technique without prompting. she has not had any improvement in her shortness of breath at this point in rehab however she has missed several sessions. will stress with patient importance of consistant attendance in order to improve stamina and strength as she wishes patients attendace has been much improved over the past 30 days. She is showing improvement in her SOB and utilizing PLB without cueing.     Expected Outcomes see admission outcomes see admission outcomes see admission outcomes        Core Components/Risk Factors/Patient Goals at Discharge (Final Review):      Goals and Risk Factor Review - 01/26/17 0813      Core Components/Risk Factors/Patient Goals Review   Personal Goals Review Improve shortness of breath with ADL's;Increase knowledge of respiratory medications and ability to use respiratory devices properly.;Develop more efficient breathing techniques such as purse lipped breathing and diaphragmatic breathing and practicing self-pacing with activity.   Review patients attendace has been much improved over the past 30 days. She is showing improvement in her SOB and utilizing PLB without cueing.   Expected Outcomes see admission outcomes      ITP Comments:   Comments: ITP REVIEW Pt is making slow progress toward pulmonary rehab goals after completing 16 sessions. Recommend continued exercise, life style modification, education, and utilization of breathing techniques to increase stamina and strength and decrease shortness of breath with exertion.

## 2017-01-27 ENCOUNTER — Encounter (HOSPITAL_COMMUNITY)
Admission: RE | Admit: 2017-01-27 | Discharge: 2017-01-27 | Disposition: A | Discharge status: Home / Self Care | Payer: Medicare Other | Source: Ambulatory Visit | Attending: Cardiology | Admitting: Cardiology

## 2017-01-27 VITALS — Wt 185.6 lb

## 2017-01-27 DIAGNOSIS — Z7901 Long term (current) use of anticoagulants: Secondary | ICD-10-CM | POA: Diagnosis not present

## 2017-01-27 DIAGNOSIS — I5032 Chronic diastolic (congestive) heart failure: Secondary | ICD-10-CM | POA: Diagnosis not present

## 2017-01-27 DIAGNOSIS — I272 Pulmonary hypertension, unspecified: Secondary | ICD-10-CM | POA: Diagnosis not present

## 2017-01-27 DIAGNOSIS — Z79899 Other long term (current) drug therapy: Secondary | ICD-10-CM | POA: Diagnosis not present

## 2017-01-27 DIAGNOSIS — N183 Chronic kidney disease, stage 3 (moderate): Secondary | ICD-10-CM | POA: Diagnosis not present

## 2017-01-27 DIAGNOSIS — I2510 Atherosclerotic heart disease of native coronary artery without angina pectoris: Secondary | ICD-10-CM | POA: Diagnosis not present

## 2017-01-27 NOTE — Progress Notes (Signed)
Daily Session Note  Patient Details  Name: Amy [name] MRN: [id number] Date of Birth: 06/20/1945 Referring Provider:     Pulmonary Rehab Walk Test from 11/11/2016 in [place]  Referring Provider  Dr. Aundra Dubin      Encounter Date: 01/27/2017  Check In:     Session Check In - 01/27/17 1414      Check-In   Location [facility]   Staff Present Rosebud Poles, RN, BSN;Molly diVincenzo, MS, ACSM RCEP, Exercise Physiologist   Supervising physician immediately available to respond to emergencies [facility] immediately available   Physician(s) Dr. Horris Latino   Medication changes reported     No   Fall or balance concerns reported    No   Tobacco Cessation No Change   Warm-up and Cool-down Performed as group-led instruction   Resistance Training Performed Yes   VAD Patient? No     Pain Assessment   Currently in Pain? No/denies   Multiple Pain Sites No      Capillary Blood Glucose: No results found for this or any previous visit (from the past 24 hour(s)).      Exercise Prescription Changes - 01/27/17 1500      Response to Exercise   Blood Pressure (Admit) 108/64   Blood Pressure (Exercise) 122/70   Blood Pressure (Exit) 96/62  111/75 after drinking water   Heart Rate (Admit) 69 bpm   Heart Rate (Exercise) 83 bpm   Heart Rate (Exit) 90 bpm   Oxygen Saturation (Admit) 89 %   Oxygen Saturation (Exercise) 89 %   Oxygen Saturation (Exit) 96 %   Rating of Perceived Exertion (Exercise) 13   Perceived Dyspnea (Exercise) 2   Duration Progress to 45 minutes of aerobic exercise without signs/symptoms of physical distress   Intensity THRR unchanged     Progression   Progression Continue to progress workloads to maintain intensity without signs/symptoms of physical distress.     Resistance Training   Training Prescription Yes   Weight orange bands   Reps 10-15   Time 10 Minutes     Oxygen   Oxygen Continuous   Liters 6     Recumbant Bike   Level 3   Minutes 17     NuStep   Level 5   Minutes 17   METs 1.9     Track   Laps 11   Minutes 17      History  Smoking Status  . Former Smoker  . Packs/day: 0.50  . Years: 44.00  . Types: Cigarettes  . Quit date: 04/07/2009  Smokeless Tobacco  . Never Used    Goals Met:  No report of cardiac concerns or symptoms Strength training completed today  Goals Unmet:  Not Applicable  Comments: Service time is from 1330 to 1500    Dr. Rush Farmer is Medical Director for Pulmonary Rehab at [facility].

## 2017-01-29 ENCOUNTER — Telehealth (HOSPITAL_COMMUNITY): Payer: Self-pay | Admitting: *Deleted

## 2017-01-29 ENCOUNTER — Encounter (HOSPITAL_COMMUNITY): Admission: RE | Admit: 2017-01-29 | Payer: Medicare Other | Source: Ambulatory Visit

## 2017-02-03 ENCOUNTER — Other Ambulatory Visit: Payer: Self-pay | Admitting: *Deleted

## 2017-02-03 ENCOUNTER — Encounter (HOSPITAL_COMMUNITY)
Admission: RE | Admit: 2017-02-03 | Discharge: 2017-02-03 | Disposition: A | Discharge status: Home / Self Care | Payer: Medicare Other | Source: Ambulatory Visit | Attending: Cardiology | Admitting: Cardiology

## 2017-02-03 ENCOUNTER — Encounter: Payer: Self-pay | Admitting: *Deleted

## 2017-02-03 VITALS — Wt 187.8 lb

## 2017-02-03 DIAGNOSIS — I272 Pulmonary hypertension, unspecified: Secondary | ICD-10-CM | POA: Diagnosis not present

## 2017-02-03 DIAGNOSIS — Z79899 Other long term (current) drug therapy: Secondary | ICD-10-CM | POA: Diagnosis not present

## 2017-02-03 DIAGNOSIS — I5032 Chronic diastolic (congestive) heart failure: Secondary | ICD-10-CM | POA: Diagnosis not present

## 2017-02-03 DIAGNOSIS — I2510 Atherosclerotic heart disease of native coronary artery without angina pectoris: Secondary | ICD-10-CM | POA: Diagnosis not present

## 2017-02-03 DIAGNOSIS — Z7901 Long term (current) use of anticoagulants: Secondary | ICD-10-CM | POA: Diagnosis not present

## 2017-02-03 DIAGNOSIS — N183 Chronic kidney disease, stage 3 (moderate): Secondary | ICD-10-CM | POA: Diagnosis not present

## 2017-02-03 NOTE — Patient Outreach (Signed)
[facility]) Care Management  02/03/2017  Amy [name] [birth year] [id number]   Discharged Telephone Contact.   RN spoke with pt today and verified identifiers. Pt reports update concerning her ongoing progress with pulmonary rehab with one month left for ongoing therapy. RN has informed pt that if she wishes to continue additional therapy that this maybe an options if requested. RN verified pt remains in the GREEN zone (HF) with weights reported yesterday at 185.6 lbs and today at 184.2 lbs with no distressful symptoms since RN last home visit. Pt reports recent follow up with her CAD provider with a good report. Pt is managing this medical condition well and has met all the goals with review of the current plan of care. Pt verifies she is taking all her medications as prescribed with no refills needed at this time. RN will continue to encourage medication adherence with her daily medications. Pt also states she has attended all medical appointments with no delays or no show appointments. Pt reports she has been placed on Uptravi starting at 800 mg BID with plans to increased this medication very slowly on the increments. Plans to increased on Friday this week to 1000 mg BID. Pt reports she has been delayed with low BP 90/50-60 so her increments to rise her dosage has been delayed. Pt state she has been educated and informed of the risk and implications along with limits on some medications. States she can not take her Nitrostat tablets and along with limitation on certain bolus medications.   Based upon pt managing her HF with all goals met and plan of care discussed pt is ready to graduate from the program and express how appreciative she has been over the past few months with RN education and home visits that has prepared her for managing her HF. RN has provided contact information to pton how to reach this RN case manager in the future and will alert her provider of pt's  disposition with [facility]. This case will be closed.  [name], RN Care Management Coordinator [place] Office [phone]

## 2017-02-03 NOTE — Progress Notes (Signed)
Daily Session Note  Patient Details  Name: [name] MRN: [id number] Date of Birth: [birth year] Referring Provider:     Pulmonary Rehab Walk Test from 11/11/2016 in [place]  Referring Provider  Dr. Aundra Dubin      Encounter Date: 02/03/2017  Check In:     Session Check In - 02/03/17 1334      Check-In   Location [facility]   Staff Present Su Hilt, MS, ACSM RCEP, Exercise Physiologist;Joan Leonia Reeves, RN, BSN   Supervising physician immediately available to respond to emergencies [facility] immediately available   Physician(s) Dr. Wendee Beavers   Medication changes reported     No   Fall or balance concerns reported    No   Tobacco Cessation No Change   Warm-up and Cool-down Performed as group-led instruction   Resistance Training Performed Yes   VAD Patient? No     Pain Assessment   Currently in Pain? No/denies   Multiple Pain Sites No      Capillary Blood Glucose: No results found for this or any previous visit (from the past 24 hour(s)).      Exercise Prescription Changes - 02/03/17 1500      Response to Exercise   Blood Pressure (Admit) 116/58   Blood Pressure (Exercise) 124/70   Blood Pressure (Exit) 112/78   Heart Rate (Admit) 67 bpm   Heart Rate (Exercise) 91 bpm   Heart Rate (Exit) 61 bpm   Oxygen Saturation (Admit) 89 %   Oxygen Saturation (Exercise) 89 %   Oxygen Saturation (Exit) 97 %   Rating of Perceived Exertion (Exercise) 15   Perceived Dyspnea (Exercise) 3   Duration Progress to 45 minutes of aerobic exercise without signs/symptoms of physical distress   Intensity THRR unchanged     Progression   Progression Continue to progress workloads to maintain intensity without signs/symptoms of physical distress.     Resistance Training   Training Prescription Yes   Weight orange bands   Reps 10-15   Time 10 Minutes     Oxygen   Oxygen Continuous   Liters 6     Recumbant Bike   Level 3   Minutes 17     NuStep   Level 5   Minutes 17   METs 1.7     Track   Laps 9   Minutes 17      History  Smoking Status  . Former Smoker  . Packs/day: 0.50  . Years: 44.00  . Types: Cigarettes  . Quit date: 04/07/2009  Smokeless Tobacco  . Never Used    Goals Met:  Exercise tolerated well No report of cardiac concerns or symptoms Strength training completed today  Goals Unmet:  Not Applicable  Comments: Service time is from 1330 to 1500    Dr. Rush Farmer is Medical Director for Pulmonary Rehab at [facility].

## 2017-02-04 ENCOUNTER — Ambulatory Visit: Payer: Self-pay | Admitting: *Deleted

## 2017-02-05 ENCOUNTER — Encounter (HOSPITAL_COMMUNITY)
Admission: RE | Admit: 2017-02-05 | Discharge: 2017-02-05 | Disposition: A | Discharge status: Home / Self Care | Payer: Medicare Other | Source: Ambulatory Visit | Attending: Cardiology | Admitting: Cardiology

## 2017-02-05 VITALS — Wt 186.1 lb

## 2017-02-05 DIAGNOSIS — I272 Pulmonary hypertension, unspecified: Secondary | ICD-10-CM | POA: Diagnosis not present

## 2017-02-05 DIAGNOSIS — G4733 Obstructive sleep apnea (adult) (pediatric): Secondary | ICD-10-CM | POA: Insufficient documentation

## 2017-02-05 DIAGNOSIS — E785 Hyperlipidemia, unspecified: Secondary | ICD-10-CM | POA: Insufficient documentation

## 2017-02-05 DIAGNOSIS — E039 Hypothyroidism, unspecified: Secondary | ICD-10-CM | POA: Insufficient documentation

## 2017-02-05 DIAGNOSIS — M519 Unspecified thoracic, thoracolumbar and lumbosacral intervertebral disc disorder: Secondary | ICD-10-CM | POA: Diagnosis not present

## 2017-02-05 DIAGNOSIS — H3530 Unspecified macular degeneration: Secondary | ICD-10-CM | POA: Diagnosis not present

## 2017-02-05 DIAGNOSIS — J449 Chronic obstructive pulmonary disease, unspecified: Secondary | ICD-10-CM | POA: Diagnosis not present

## 2017-02-05 DIAGNOSIS — N183 Chronic kidney disease, stage 3 (moderate): Secondary | ICD-10-CM | POA: Insufficient documentation

## 2017-02-05 DIAGNOSIS — I2510 Atherosclerotic heart disease of native coronary artery without angina pectoris: Secondary | ICD-10-CM | POA: Insufficient documentation

## 2017-02-05 DIAGNOSIS — I481 Persistent atrial fibrillation: Secondary | ICD-10-CM | POA: Diagnosis not present

## 2017-02-05 DIAGNOSIS — K219 Gastro-esophageal reflux disease without esophagitis: Secondary | ICD-10-CM | POA: Insufficient documentation

## 2017-02-05 DIAGNOSIS — Z79899 Other long term (current) drug therapy: Secondary | ICD-10-CM | POA: Diagnosis not present

## 2017-02-05 DIAGNOSIS — R001 Bradycardia, unspecified: Secondary | ICD-10-CM | POA: Insufficient documentation

## 2017-02-05 DIAGNOSIS — Z7901 Long term (current) use of anticoagulants: Secondary | ICD-10-CM | POA: Insufficient documentation

## 2017-02-05 DIAGNOSIS — I130 Hypertensive heart and chronic kidney disease with heart failure and stage 1 through stage 4 chronic kidney disease, or unspecified chronic kidney disease: Secondary | ICD-10-CM | POA: Insufficient documentation

## 2017-02-05 DIAGNOSIS — Z87891 Personal history of nicotine dependence: Secondary | ICD-10-CM | POA: Insufficient documentation

## 2017-02-05 DIAGNOSIS — I5032 Chronic diastolic (congestive) heart failure: Secondary | ICD-10-CM | POA: Insufficient documentation

## 2017-02-05 NOTE — Progress Notes (Signed)
Daily Session Note  Patient Details  Name: Amy [name] MRN: [id number] Date of Birth: Aug 27, 1945 Referring Provider:     Pulmonary Rehab Walk Test from 11/11/2016 in [place]  Referring Provider  Dr. Aundra Dubin      Encounter Date: 02/05/2017  Check In:     Session Check In - 02/05/17 1333      Check-In   Location [facility]   Staff Present Su Hilt, MS, ACSM RCEP, Exercise Physiologist;Joan Leonia Reeves, RN, Luisa Hart, RN, BSN   Supervising physician immediately available to respond to emergencies [facility] immediately available   Physician(s) Dr. Wynetta Emery   Medication changes reported     No   Fall or balance concerns reported    No   Tobacco Cessation No Change   Warm-up and Cool-down Performed as group-led instruction   Resistance Training Performed Yes   VAD Patient? No     Pain Assessment   Currently in Pain? No/denies   Multiple Pain Sites No      Capillary Blood Glucose: No results found for this or any previous visit (from the past 24 hour(s)).      Exercise Prescription Changes - 02/05/17 1619      Response to Exercise   Blood Pressure (Admit) 108/60   Blood Pressure (Exercise) 100/64   Blood Pressure (Exit) 98/62   Heart Rate (Admit) 61 bpm   Heart Rate (Exercise) 78 bpm   Heart Rate (Exit) 69 bpm   Oxygen Saturation (Admit) 98 %   Oxygen Saturation (Exercise) 94 %   Oxygen Saturation (Exit) 97 %   Rating of Perceived Exertion (Exercise) 11   Perceived Dyspnea (Exercise) 1   Duration Progress to 45 minutes of aerobic exercise without signs/symptoms of physical distress   Intensity THRR unchanged     Progression   Progression Continue to progress workloads to maintain intensity without signs/symptoms of physical distress.     Resistance Training   Training Prescription Yes   Weight orange bands   Reps 10-15   Time 10 Minutes     Oxygen   Oxygen Continuous   Liters 6      NuStep   Level 5   Minutes 17   METs 1.8     Track   Laps 13   Minutes 17      History  Smoking Status  . Former Smoker  . Packs/day: 0.50  . Years: 44.00  . Types: Cigarettes  . Quit date: 04/07/2009  Smokeless Tobacco  . Never Used    Goals Met:  Independence with exercise equipment Improved SOB with ADL's Using PLB without cueing & demonstrates good technique Exercise tolerated well No report of cardiac concerns or symptoms Strength training completed today  Goals Unmet:  Not Applicable  Comments: Service time is from 1330 to 1515   Dr. Rush Farmer is Medical Director for Pulmonary Rehab at [facility].

## 2017-02-10 ENCOUNTER — Encounter (HOSPITAL_COMMUNITY)
Admission: RE | Admit: 2017-02-10 | Discharge: 2017-02-10 | Disposition: A | Discharge status: Home / Self Care | Payer: Medicare Other | Source: Ambulatory Visit | Attending: Cardiology | Admitting: Cardiology

## 2017-02-10 VITALS — Wt 185.6 lb

## 2017-02-10 DIAGNOSIS — Z79899 Other long term (current) drug therapy: Secondary | ICD-10-CM | POA: Diagnosis not present

## 2017-02-10 DIAGNOSIS — Z7901 Long term (current) use of anticoagulants: Secondary | ICD-10-CM | POA: Diagnosis not present

## 2017-02-10 DIAGNOSIS — I272 Pulmonary hypertension, unspecified: Secondary | ICD-10-CM | POA: Diagnosis not present

## 2017-02-10 DIAGNOSIS — I5032 Chronic diastolic (congestive) heart failure: Secondary | ICD-10-CM | POA: Diagnosis not present

## 2017-02-10 DIAGNOSIS — I2510 Atherosclerotic heart disease of native coronary artery without angina pectoris: Secondary | ICD-10-CM | POA: Diagnosis not present

## 2017-02-10 DIAGNOSIS — N183 Chronic kidney disease, stage 3 (moderate): Secondary | ICD-10-CM | POA: Diagnosis not present

## 2017-02-10 NOTE — Progress Notes (Signed)
Daily Session Note  Patient Details  Name: Amy [name] MRN: [id number] Date of Birth: [birth year] Referring Provider:     Pulmonary Rehab Walk Test from 11/11/2016 in [place]  Referring Provider  Dr. Aundra Dubin      Encounter Date: 02/10/2017  Check In: Session Check In - 02/10/17 1328      Check-In   Location  [facility]    Staff Present  Su Hilt, MS, ACSM RCEP, Exercise Physiologist;Neeya Prigmore Ysidro Evert, RN;Portia Epworth, RN, Maxcine Ham, RN, BSN    Supervising physician immediately available to respond to emergencies  [facility] immediately available    Physician(s)  Dr. Broadus John    Medication changes reported      No    Fall or balance concerns reported     No    Tobacco Cessation  No Change    Warm-up and Cool-down  Performed as group-led instruction    Resistance Training Performed  Yes    VAD Patient?  No      Pain Assessment   Currently in Pain?  No/denies    Multiple Pain Sites  No       Capillary Blood Glucose: No results found for this or any previous visit (from the past 24 hour(s)).  Exercise Prescription Changes - 02/10/17 1500      Response to Exercise   Blood Pressure (Admit)  87/58 recheck after gator aide to drink BP 96/58   recheck after gator aide to drink BP 96/58   Blood Pressure (Exercise)  106/50    Blood Pressure (Exit)  80/46    Heart Rate (Admit)  64 bpm    Heart Rate (Exercise)  85 bpm    Heart Rate (Exit)  71 bpm    Oxygen Saturation (Admit)  90 %    Oxygen Saturation (Exercise)  90 %    Oxygen Saturation (Exit)  92 %    Rating of Perceived Exertion (Exercise)  12    Perceived Dyspnea (Exercise)  2    Duration  Progress to 45 minutes of aerobic exercise without signs/symptoms of physical distress    Intensity  THRR unchanged      Progression   Progression  Continue to progress workloads to maintain intensity without signs/symptoms of physical distress.      Resistance  Training   Training Prescription  Yes    Weight  orange bands    Reps  10-15    Time  10 Minutes      Oxygen   Oxygen  Continuous    Liters  6      Recumbant Bike   Level  3    Minutes  17      NuStep   Level  5    Minutes  17    METs  1.6      Track   Laps  10    Minutes  17       Social History   Tobacco Use  Smoking Status Former Smoker  . Packs/day: 0.50  . Years: 44.00  . Pack years: 22.00  . Types: Cigarettes  . Last attempt to quit: 04/07/2009  . Years since quitting: 7.8  Smokeless Tobacco Never Used    Goals Met:  Exercise tolerated well No report of cardiac concerns or symptoms Strength training completed today  Goals Unmet:  Not Applicable  Comments: Service time is from 1330 to 1500    Dr. Rush Farmer is Medical Director  for Pulmonary Rehab at [facility].

## 2017-02-12 ENCOUNTER — Encounter (HOSPITAL_COMMUNITY)
Admission: RE | Admit: 2017-02-12 | Discharge: 2017-02-12 | Disposition: A | Discharge status: Home / Self Care | Payer: Medicare Other | Source: Ambulatory Visit | Attending: Cardiology | Admitting: Cardiology

## 2017-02-12 VITALS — Wt 187.8 lb

## 2017-02-12 DIAGNOSIS — Z79899 Other long term (current) drug therapy: Secondary | ICD-10-CM | POA: Diagnosis not present

## 2017-02-12 DIAGNOSIS — I2510 Atherosclerotic heart disease of native coronary artery without angina pectoris: Secondary | ICD-10-CM | POA: Diagnosis not present

## 2017-02-12 DIAGNOSIS — N183 Chronic kidney disease, stage 3 (moderate): Secondary | ICD-10-CM | POA: Diagnosis not present

## 2017-02-12 DIAGNOSIS — I5032 Chronic diastolic (congestive) heart failure: Secondary | ICD-10-CM | POA: Diagnosis not present

## 2017-02-12 DIAGNOSIS — I272 Pulmonary hypertension, unspecified: Secondary | ICD-10-CM

## 2017-02-12 DIAGNOSIS — Z7901 Long term (current) use of anticoagulants: Secondary | ICD-10-CM | POA: Diagnosis not present

## 2017-02-12 NOTE — Progress Notes (Signed)
Daily Session Note  Patient Details  Name: Amy [name] MRN: [id number] Date of Birth: [birth year] Referring Provider:     Pulmonary Rehab Walk Test from 11/11/2016 in [place]  Referring Provider  Dr. Aundra Dubin      Encounter Date: 02/12/2017  Check In: Session Check In - 02/12/17 1330      Check-In   Location  [facility]    Staff Present  Rosebud Poles, RN, BSN;Molly diVincenzo, MS, ACSM RCEP, Exercise Physiologist;Portia Rollene Rotunda, RN, BSN    Supervising physician immediately available to respond to emergencies  [facility] immediately available    Physician(s)  Dr. Quincy Simmonds    Medication changes reported      No    Fall or balance concerns reported     No    Tobacco Cessation  No Change    Warm-up and Cool-down  Performed as group-led instruction    Resistance Training Performed  Yes    VAD Patient?  No      Pain Assessment   Currently in Pain?  No/denies    Multiple Pain Sites  No       Capillary Blood Glucose: No results found for this or any previous visit (from the past 24 hour(s)).  Exercise Prescription Changes - 02/12/17 1500      Response to Exercise   Blood Pressure (Admit)  110/60    Blood Pressure (Exercise)  120/72    Blood Pressure (Exit)  100/64    Heart Rate (Admit)  61 bpm    Heart Rate (Exercise)  78 bpm    Heart Rate (Exit)  65 bpm    Oxygen Saturation (Admit)  94 %    Oxygen Saturation (Exercise)  89 %    Oxygen Saturation (Exit)  94 %    Rating of Perceived Exertion (Exercise)  13    Perceived Dyspnea (Exercise)  2    Duration  Progress to 45 minutes of aerobic exercise without signs/symptoms of physical distress    Intensity  THRR unchanged      Progression   Progression  Continue to progress workloads to maintain intensity without signs/symptoms of physical distress.      Resistance Training   Training Prescription  Yes    Weight  orange bands    Reps  10-15    Time  10 Minutes      Oxygen   Oxygen  Continuous    Liters  6      Recumbant Bike   Minutes  17      NuStep   Level  5    Minutes  17    METs  1.8       Social History   Tobacco Use  Smoking Status Former Smoker  . Packs/day: 0.50  . Years: 44.00  . Pack years: 22.00  . Types: Cigarettes  . Last attempt to quit: 04/07/2009  . Years since quitting: 7.8  Smokeless Tobacco Never Used    Goals Met:  Exercise tolerated well Strength training completed today  Goals Unmet:  Not Applicable  Comments: Service time is from1330 to 1530    Dr. Rush Farmer is Medical Director for Pulmonary Rehab at [facility].

## 2017-02-17 ENCOUNTER — Encounter (HOSPITAL_COMMUNITY)
Admission: RE | Admit: 2017-02-17 | Discharge: 2017-02-17 | Disposition: A | Discharge status: Home / Self Care | Payer: Medicare Other | Source: Ambulatory Visit | Attending: Cardiology | Admitting: Cardiology

## 2017-02-17 VITALS — Wt 187.6 lb

## 2017-02-17 DIAGNOSIS — I272 Pulmonary hypertension, unspecified: Secondary | ICD-10-CM

## 2017-02-17 DIAGNOSIS — Z7901 Long term (current) use of anticoagulants: Secondary | ICD-10-CM | POA: Diagnosis not present

## 2017-02-17 DIAGNOSIS — N183 Chronic kidney disease, stage 3 (moderate): Secondary | ICD-10-CM | POA: Diagnosis not present

## 2017-02-17 DIAGNOSIS — Z79899 Other long term (current) drug therapy: Secondary | ICD-10-CM | POA: Diagnosis not present

## 2017-02-17 DIAGNOSIS — I5032 Chronic diastolic (congestive) heart failure: Secondary | ICD-10-CM | POA: Diagnosis not present

## 2017-02-17 DIAGNOSIS — I2510 Atherosclerotic heart disease of native coronary artery without angina pectoris: Secondary | ICD-10-CM | POA: Diagnosis not present

## 2017-02-17 NOTE — Progress Notes (Signed)
Daily Session Note  Patient Details  Name: Amy [name]  MRN: [id number] Date of Birth: 05/12/1945 Referring Provider:     Pulmonary Rehab Walk Test from 11/11/2016 in [facility]  Referring Provider  Dr. Mclean      Encounter Date: 02/17/2017  Check In: Session Check In - 02/17/17 1330      Check-In   Location  [facility]    Staff Present  Joan Behrens, RN, BSN;Molly diVincenzo, MS, ACSM RCEP, Exercise Physiologist;Portia Payne, RN, BSN; , RN    Supervising physician immediately available to respond to emergencies  [facility] immediately available    Physician(s)  Dr. Choi    Medication changes reported      No    Fall or balance concerns reported     No    Tobacco Cessation  No Change    Warm-up and Cool-down  Performed as group-led instruction    Resistance Training Performed  Yes    VAD Patient?  No      Pain Assessment   Currently in Pain?  No/denies    Multiple Pain Sites  No       Capillary Blood Glucose: No results found for this or any previous visit (from the past 24 hour(s)).  Exercise Prescription Changes - 02/17/17 1500      Response to Exercise   Blood Pressure (Admit)  90/54    Blood Pressure (Exercise)  130/60    Blood Pressure (Exit)  100/56    Heart Rate (Admit)  61 bpm    Heart Rate (Exercise)  84 bpm    Heart Rate (Exit)  68 bpm    Oxygen Saturation (Admit)  92 %    Oxygen Saturation (Exercise)  90 %    Oxygen Saturation (Exit)  98 %    Rating of Perceived Exertion (Exercise)  14    Perceived Dyspnea (Exercise)  3    Duration  Progress to 45 minutes of aerobic exercise without signs/symptoms of physical distress    Intensity  THRR unchanged      Progression   Progression  Continue to progress workloads to maintain intensity without signs/symptoms of physical distress.      Resistance Training   Training Prescription  Yes    Weight  orange bands    Reps  10-15    Time   10 Minutes      Oxygen   Oxygen  Continuous    Liters  6      Recumbant Bike   Level  4    Minutes  17      NuStep   Level  6    Minutes  17    METs  1.6      Track   Laps  9    Minutes  17       Social History   Tobacco Use  Smoking Status Former Smoker  . Packs/day: 0.50  . Years: 44.00  . Pack years: 22.00  . Types: Cigarettes  . Last attempt to quit: 04/07/2009  . Years since quitting: 7.8  Smokeless Tobacco Never Used    Goals Met:  Exercise tolerated well No report of cardiac concerns or symptoms Strength training completed today  Goals Unmet:  Not Applicable  Comments: Service time is from 1330 to 1500    Dr. Wesam G. Yacoub is Medical Director for Pulmonary Rehab at [facility]. 

## 2017-02-19 ENCOUNTER — Encounter (HOSPITAL_COMMUNITY)
Admission: RE | Admit: 2017-02-19 | Discharge: 2017-02-19 | Disposition: A | Discharge status: Home / Self Care | Payer: Medicare Other | Source: Ambulatory Visit | Attending: Cardiology | Admitting: Cardiology

## 2017-02-19 VITALS — Wt 190.0 lb

## 2017-02-19 DIAGNOSIS — N183 Chronic kidney disease, stage 3 (moderate): Secondary | ICD-10-CM | POA: Diagnosis not present

## 2017-02-19 DIAGNOSIS — Z79899 Other long term (current) drug therapy: Secondary | ICD-10-CM | POA: Diagnosis not present

## 2017-02-19 DIAGNOSIS — I2510 Atherosclerotic heart disease of native coronary artery without angina pectoris: Secondary | ICD-10-CM | POA: Diagnosis not present

## 2017-02-19 DIAGNOSIS — Z7901 Long term (current) use of anticoagulants: Secondary | ICD-10-CM | POA: Diagnosis not present

## 2017-02-19 DIAGNOSIS — I272 Pulmonary hypertension, unspecified: Secondary | ICD-10-CM | POA: Diagnosis not present

## 2017-02-19 DIAGNOSIS — I5032 Chronic diastolic (congestive) heart failure: Secondary | ICD-10-CM | POA: Diagnosis not present

## 2017-02-19 NOTE — Progress Notes (Signed)
Pulmonary Individual Treatment Plan  Patient Details  Name: Amy [name] MRN: [id number] Date of Birth: [birth year] Referring Provider:     Pulmonary Rehab Walk Test from 11/11/2016 in [facility]  Referring Provider  Dr. Aundra Dubin      Initial Encounter Date:    Pulmonary Rehab Walk Test from 11/11/2016 in [facility]  Date  11/11/16  Referring Provider  Dr. Aundra Dubin      Visit Diagnosis: Pulmonary hypertension ([place])  Patient's Home Medications on Admission:   Current Outpatient Medications:  .  acetaminophen (TYLENOL) 325 MG tablet, Take 2 tablets (650 mg total) by mouth every 4 (four) hours as needed for headache or mild pain., Disp: , Rfl:  .  atorvastatin (LIPITOR) 80 MG tablet, Take 1 tablet (80 mg total) by mouth daily with lunch., Disp: 90 tablet, Rfl: 3 .  Cholecalciferol (VITAMIN D) 2000 UNITS CAPS, Take 2,000 Units by mouth daily. , Disp: , Rfl:  .  colchicine 0.6 MG tablet, Take 0.6 mg by mouth daily. , Disp: , Rfl:  .  cyclobenzaprine (FLEXERIL) 5 MG tablet, Take 1 tablet (5 mg total) by mouth 3 (three) times daily as needed for muscle spasms., Disp: 10 tablet, Rfl: 0 .  ezetimibe (ZETIA) 10 MG tablet, TAKE 1 TABLET (10 MG TOTAL) BY MOUTH IN THE EVENING, Disp: 90 tablet, Rfl: 3 .  furosemide (LASIX) 20 MG tablet, Take 1 tablet (20 mg total) by mouth daily., Disp: 30 tablet, Rfl: 6 .  Glycopyrrolate-Formoterol (BEVESPI AEROSPHERE) 9-4.8 MCG/ACT AERO, Inhale 1 puff into the lungs 2 (two) times daily., Disp: , Rfl:  .  levothyroxine (SYNTHROID, LEVOTHROID) 100 MCG tablet, Take 100 mcg by mouth daily before breakfast., Disp: , Rfl:  .  Macitentan (OPSUMIT) 10 MG TABS, Take 1 tablet (10 mg total) by mouth daily., Disp: 30 tablet, Rfl: 11 .  metoprolol tartrate (LOPRESSOR) 25 MG tablet, Take 0.5 tablets (12.5 mg total) by mouth daily as needed., Disp: 15 tablet, Rfl: 11 .  MULTAQ 400 MG tablet, TAKE 1 TABLET BY MOUTH  TWICE A DAY WITH A MEAL, Disp: 180 tablet, Rfl: 3 .  Multiple Vitamin (MULTIVITAMIN) capsule, Take 1 capsule by mouth daily., Disp: , Rfl:  .  pantoprazole (PROTONIX) 40 MG tablet, TAKE 1 TABLET BY MOUTH DAILY, Disp: 90 tablet, Rfl: 2 .  potassium chloride SA (K-DUR,KLOR-CON) 20 MEQ tablet, Take 0.5 tablets (10 mEq total) by mouth at bedtime., Disp: , Rfl:  .  Rivaroxaban (XARELTO) 15 MG TABS tablet, Take 1 tablet (15 mg total) by mouth daily with supper., Disp: 30 tablet, Rfl: 11 .  Selexipag (UPTRAVI) 200 MCG TABS, Take 800 mcg by mouth 2 (two) times daily. Plans to increased to 1060mg on 11/2, Disp: , Rfl:  .  UNABLE TO FIND, Med Name: CPAP  DME-[facility], Disp: , Rfl:   Past Medical History: Past Medical History:  Diagnosis Date  . Apical variant hypertrophic cardiomyopathy ([place]    Diagnosed by echo and ECG 11/13  . Atypical atrial flutter ([place]   . CAD (coronary artery disease)    a. LHC in 10/2011 demonstrated non-obstructive disease and an 80% lesion in a small D1 treated medically.   . CHF (congestive heart failure) ([place]   . Chronic diastolic heart failure ([place]   . CKD (chronic kidney disease), stage III ([facility]   . COPD (chronic obstructive pulmonary disease) ([place]   . Dizziness    had PT for being off balance -  in approx. 2012  . GERD (gastroesophageal reflux disease)   . Hyperlipidemia   . Hypertension   . Hypothyroid   . Lumbar disc disease   . Macular degeneration   . OSA (obstructive sleep apnea)   . Persistent atrial fibrillation ([place])       . Pulmonary hypertension ([place])    a. [place] 05/2013 showed mild PAH with normal PCWP and RA pressure, could be related to OSA and low oxygen saturation.   . Refusal of blood transfusions as patient is Jehovah's Witness   . Sinus bradycardia     Tobacco Use: Social History   Tobacco Use  Smoking Status Former Smoker  . Packs/day: 0.50  . Years: 44.00  . Pack years: 22.00  . Types: Cigarettes  . Last attempt to quit: 04/07/2009  .  Years since quitting: 7.8  Smokeless Tobacco Never Used    Labs: Recent Review Flowsheet Data    Labs for ITP Cardiac and Pulmonary Rehab Latest Ref Rng & Units 09/27/2014 10/28/2014 06/25/2016 08/29/2016 09/12/2016   Cholestrol 100 - 199 mg/dL 137 - - 126 105   LDLCALC 0 - 99 mg/dL 72 - - 59 49   LDLDIRECT mg/dL - - - - -   HDL >39 mg/dL 49.50 - - 44 41   Trlycerides 0 - 149 mg/dL 76.0 - - 117 76   Hemoglobin A1c 4.8 - 5.6 % - 6.1(H) - - -   PHART 7.350 - 7.450 - - - - -   PCO2ART 35.0 - 45.0 mmHg - - - - -   HCO3 20.0 - 28.0 mmol/L - - 21.4 - -   TCO2 0 - 100 mmol/L - - 23 - -   ACIDBASEDEF 0.0 - 2.0 mmol/L - - 3.0(H) - -   O2SAT % - - 57.0 - -      Capillary Blood Glucose: No results found for: GLUCAP   Pulmonary Assessment Scores: Pulmonary Assessment Scores    Row Name 11/11/16 1646         ADL UCSD   ADL Phase  Entry       mMRC Score   mMRC Score  4        Pulmonary Function Assessment: Pulmonary Function Assessment - 11/07/16 1502      Breath   Bilateral Breath Sounds  Clear    Shortness of Breath  Yes;Limiting activity       Exercise Target Goals:    Exercise Program Goal: Individual exercise prescription set with THRR, safety & activity barriers. Participant demonstrates ability to understand and report RPE using BORG scale, to self-measure pulse accurately, and to acknowledge the importance of the exercise prescription.  Exercise Prescription Goal: Starting with aerobic activity 30 plus minutes a day, 3 days per week for initial exercise prescription. Provide home exercise prescription and guidelines that participant acknowledges understanding prior to discharge.  Activity Barriers & Risk Stratification:   6 Minute Walk: 6 Minute Walk    Row Name 11/11/16 1643         6 Minute Walk   Phase  Initial     Distance  730 feet     Walk Time  6 minutes     # of Rest Breaks  0     MPH  1.38     METS  2.07     RPE  13     Perceived Dyspnea   3      Symptoms  Yes (comment)  Comments  wheelchair     Resting HR  66 bpm     Resting BP  110/73     Max Ex. HR  89 bpm     Max Ex. BP  113/74       Interval HR   Baseline HR (retired)  66     1 Minute HR  72     2 Minute HR  90     3 Minute HR  89     4 Minute HR  87     5 Minute HR  89     6 Minute HR  86     2 Minute Post HR  79     Interval Heart Rate?  Yes       Interval Oxygen   Interval Oxygen?  Yes     Baseline Oxygen Saturation %  85 %     Resting Liters of Oxygen  3 L     1 Minute Oxygen Saturation %  90 %     1 Minute Liters of Oxygen  4 L     2 Minute Oxygen Saturation %  89 %     2 Minute Liters of Oxygen  4 L     3 Minute Oxygen Saturation %  88 %     3 Minute Liters of Oxygen  4 L     4 Minute Oxygen Saturation %  89 %     4 Minute Liters of Oxygen  4 L     5 Minute Oxygen Saturation %  88 %     5 Minute Liters of Oxygen  4 L     6 Minute Oxygen Saturation %  85 %     6 Minute Liters of Oxygen  4 L     2 Minute Post Oxygen Saturation %  92 %     2 Minute Post Liters of Oxygen  4 L        Oxygen Initial Assessment: Oxygen Initial Assessment - 11/11/16 1645      Initial 6 min Walk   Oxygen Used  Continuous;E-Tanks    Liters per minute  4    Resting Oxygen Saturation   94 %    Exercise Oxygen Saturation  during 6 min walk  85 %      Program Oxygen Prescription   Program Oxygen Prescription  Continuous;E-Tanks    Liters per minute  -- 4-6    Comments  needs depending on activity       Oxygen Re-Evaluation: Oxygen Re-Evaluation    Row Name 12/03/16 2107 12/26/16 1352 01/26/17 0812 02/17/17 1004       Program Oxygen Prescription   Program Oxygen Prescription  Continuous;E-Tanks  Continuous;E-Tanks  Continuous;E-Tanks  Continuous;E-Tanks    Liters per minute  -  -  -  6    Comments  needs 4 to 6 liters depending on activity  needs 4 to 6 liters depending on activity  needs 4 to 6 liters depending on activity  needs 4 to 6 liters depending on  activity      Home Oxygen   Home Oxygen Device  Home Concentrator;E-Tanks  Home Concentrator;E-Tanks  Home Concentrator;E-Tanks  Home Concentrator;E-Tanks    Sleep Oxygen Prescription  CPAP  CPAP  CPAP  CPAP    Liters per minute  _0 Home Exercise Oxygen Prescription  Continuous  Continuous  Continuous  Continuous    Liters  per minute  - 4 to 6  - 4-6  -  6    Home at Rest Exercise Oxygen Prescription  None  Continuous  Continuous  Continuous    Liters per minute  _0 Compliance with Home Oxygen Use  Yes  Yes  Yes  Yes      Goals/Expected Outcomes   Short Term Goals  To learn and understand importance of monitoring SPO2 with pulse oximeter and demonstrate accurate use of the pulse oximeter.;To learn and understand importance of maintaining oxygen saturations>88%;To learn and exhibit compliance with exercise, home and travel O2 prescription;To learn and demonstrate proper pursed lip breathing techniques or other breathing techniques.;To learn and demonstrate proper use of respiratory medications  To learn and understand importance of monitoring SPO2 with pulse oximeter and demonstrate accurate use of the pulse oximeter.;To learn and understand importance of maintaining oxygen saturations>88%;To learn and exhibit compliance with exercise, home and travel O2 prescription;To learn and demonstrate proper pursed lip breathing techniques or other breathing techniques.;To learn and demonstrate proper use of respiratory medications  To learn and understand importance of monitoring SPO2 with pulse oximeter and demonstrate accurate use of the pulse oximeter.;To learn and understand importance of maintaining oxygen saturations>88%;To learn and exhibit compliance with exercise, home and travel O2 prescription;To learn and demonstrate proper pursed lip breathing techniques or other breathing techniques.;To learn and demonstrate proper use of respiratory medications  To learn and understand  importance of monitoring SPO2 with pulse oximeter and demonstrate accurate use of the pulse oximeter.;To learn and understand importance of maintaining oxygen saturations>88%;To learn and exhibit compliance with exercise, home and travel O2 prescription;To learn and demonstrate proper pursed lip breathing techniques or other breathing techniques.;To learn and demonstrate proper use of respiratory medications    Long  Term Goals  Exhibits compliance with exercise, home and travel O2 prescription;Verbalizes importance of monitoring SPO2 with pulse oximeter and return demonstration;Maintenance of O2 saturations>88%;Exhibits proper breathing techniques, such as pursed lip breathing or other method taught during program session;Compliance with respiratory medication  Exhibits compliance with exercise, home and travel O2 prescription;Verbalizes importance of monitoring SPO2 with pulse oximeter and return demonstration;Maintenance of O2 saturations>88%;Exhibits proper breathing techniques, such as pursed lip breathing or other method taught during program session;Compliance with respiratory medication  Exhibits compliance with exercise, home and travel O2 prescription;Verbalizes importance of monitoring SPO2 with pulse oximeter and return demonstration;Maintenance of O2 saturations>88%;Exhibits proper breathing techniques, such as pursed lip breathing or other method taught during program session;Compliance with respiratory medication  Exhibits compliance with exercise, home and travel O2 prescription;Verbalizes importance of monitoring SPO2 with pulse oximeter and return demonstration;Maintenance of O2 saturations>88%;Exhibits proper breathing techniques, such as pursed lip breathing or other method taught during program session;Compliance with respiratory medication    Comments  -  patient verbalizes compliance with home oxygen use  patient verbalizes compliance with home oxygen use  exhibits compliance with  oxygen    Goals/Expected Outcomes  -  see above  see above  continued compliance with oxygen useage       Oxygen Discharge (Final Oxygen Re-Evaluation): Oxygen Re-Evaluation - 02/17/17 1004      Program Oxygen Prescription   Program Oxygen Prescription  Continuous;E-Tanks    Liters per minute  6    Comments  needs 4 to 6 liters depending on activity      Home Oxygen   Home Oxygen Device  Home Concentrator;E-Tanks    Sleep Oxygen  Prescription  CPAP    Liters per minute  3    Home Exercise Oxygen Prescription  Continuous    Liters per minute  6    Home at Rest Exercise Oxygen Prescription  Continuous    Liters per minute  6    Compliance with Home Oxygen Use  Yes      Goals/Expected Outcomes   Short Term Goals  To learn and understand importance of monitoring SPO2 with pulse oximeter and demonstrate accurate use of the pulse oximeter.;To learn and understand importance of maintaining oxygen saturations>88%;To learn and exhibit compliance with exercise, home and travel O2 prescription;To learn and demonstrate proper pursed lip breathing techniques or other breathing techniques.;To learn and demonstrate proper use of respiratory medications    Long  Term Goals  Exhibits compliance with exercise, home and travel O2 prescription;Verbalizes importance of monitoring SPO2 with pulse oximeter and return demonstration;Maintenance of O2 saturations>88%;Exhibits proper breathing techniques, such as pursed lip breathing or other method taught during program session;Compliance with respiratory medication    Comments  exhibits compliance with oxygen    Goals/Expected Outcomes  continued compliance with oxygen useage       Initial Exercise Prescription: Initial Exercise Prescription - 11/11/16 1600      Date of Initial Exercise RX and Referring Provider   Date  11/11/16    Referring Provider  Dr. Aundra Dubin      Oxygen   Oxygen  Continuous    Liters  -- 4-6      Recumbant Bike   Level  1     Minutes  17      NuStep   Level  1    Minutes  17    METs  1.3      Track   Laps  4    Minutes  17      Prescription Details   Frequency (times per week)  2    Duration  Progress to 45 minutes of aerobic exercise without signs/symptoms of physical distress      Intensity   THRR 40-80% of Max Heartrate  60-119    Ratings of Perceived Exertion  11-13    Perceived Dyspnea  0-4      Progression   Progression  Continue to progress workloads to maintain intensity without signs/symptoms of physical distress.      Resistance Training   Training Prescription  Yes    Weight  orange bands    Reps  10-15       Perform Capillary Blood Glucose checks as needed.  Exercise Prescription Changes: Exercise Prescription Changes    Row Name 11/13/16 1600 11/18/16 1500 11/20/16 1500 11/27/16 1500 12/02/16 1545     Response to Exercise   Blood Pressure (Admit)  98/54  110/70  130/68  96/54  102/58   Blood Pressure (Exercise)  106/60  132/80  118/56  128/70  100/62   Blood Pressure (Exit)  102/58  104/64  110/70  106/70  102/62   Heart Rate (Admit)  62 bpm  54 bpm  61 bpm  63 bpm  65 bpm   Heart Rate (Exercise)  73 bpm  72 bpm  76 bpm  77 bpm  73 bpm   Heart Rate (Exit)  63 bpm  54 bpm  63 bpm  60 bpm  64 bpm   Oxygen Saturation (Admit)  89 %  88 % increased O2 from 3L to 4L SaO2 inc to 90%   92 %  91 %  94 %   Oxygen Saturation (Exercise)  88 %  88 % inc O2 from 4L to 6L, SaO2 inc to 94%  87 % O2 increased to 6L sat up to 89  90 %  90 %   Oxygen Saturation (Exit)  93 %  98 % 6L O2  97 %  95 %  91 %   Rating of Perceived Exertion (Exercise)  _0 Perceived Dyspnea (Exercise)  _1 Duration  Progress to 45 minutes of aerobic exercise without signs/symptoms of physical distress  Progress to 45 minutes of aerobic exercise without signs/symptoms of physical distress  Progress to 45 minutes of aerobic exercise without signs/symptoms of physical distress  Progress to 45  minutes of aerobic exercise without signs/symptoms of physical distress  Progress to 45 minutes of aerobic exercise without signs/symptoms of physical distress   Intensity  THRR unchanged  THRR unchanged  THRR unchanged  THRR unchanged  THRR unchanged     Progression   Progression  Continue to progress workloads to maintain intensity without signs/symptoms of physical distress.  Continue to progress workloads to maintain intensity without signs/symptoms of physical distress.  Continue to progress workloads to maintain intensity without signs/symptoms of physical distress.  Continue to progress workloads to maintain intensity without signs/symptoms of physical distress.  Continue to progress workloads to maintain intensity without signs/symptoms of physical distress.     Resistance Training   Training Prescription  Yes  Yes  Yes  Yes  Yes   Weight  orange bands  orange bands  orange bands  orange bands  orange bands   Reps  10-15  10-15  10-15  10-15  10-15   Time  -  10 Minutes  10 Minutes  10 Minutes  10 Minutes     Oxygen   Oxygen  Continuous  Continuous  Continuous  Continuous  Continuous   Liters  - 4-6  - 4-6  4-6  4-6  4-6     Recumbant Bike   Level  -  _2 Minutes  -  _3 NuStep   Level  _4 -  2   Minutes  _5 -  17   METs  2  1.4  -  -  1.4     Track   Laps  _6 Minutes  _7 Row Name 12/04/16 1600 12/16/16 1545 12/18/16 1527 12/25/16 1600 12/30/16 1526     Response to Exercise   Blood Pressure (Admit)  82/40 Rechecked BP 112/62  94/64  104/62  92/54  88/44   Blood Pressure (Exercise)  130/70  120/60  112/60  118/62  110/50   Blood Pressure (Exit)  100/60  104/70  108/58  90/60  96/60   Heart Rate (Admit)  57 bpm  60 bpm  63 bpm  73 bpm  65 bpm   Heart Rate (Exercise)  73 bpm  74 bpm  77 bpm  82 bpm  80 bpm   Heart Rate (Exit)  60 bpm  61 bpm  61 bpm  77 bpm  65 bpm   Oxygen Saturation (Admit)  93 %  92 %   92 %  97 %  91 %   Oxygen Saturation (Exercise)  95 %  95 %  90 %  92 %  91 %   Oxygen Saturation (Exit)  90 %  91 %  94 %  95 %  92 %   Rating of Perceived Exertion (Exercise)  _0 Perceived Dyspnea (Exercise)  _1 Duration  Progress to 45 minutes of aerobic exercise without signs/symptoms of physical distress  Progress to 45 minutes of aerobic exercise without signs/symptoms of physical distress  Progress to 45 minutes of aerobic exercise without signs/symptoms of physical distress  Progress to 45 minutes of aerobic exercise without signs/symptoms of physical distress  Progress to 45 minutes of aerobic exercise without signs/symptoms of physical distress   Intensity  THRR unchanged  THRR unchanged  THRR unchanged  THRR unchanged  THRR unchanged     Progression   Progression  Continue to progress workloads to maintain intensity without signs/symptoms of physical distress.  Continue to progress workloads to maintain intensity without signs/symptoms of physical distress.  Continue to progress workloads to maintain intensity without signs/symptoms of physical distress.  Continue to progress workloads to maintain intensity without signs/symptoms of physical distress.  Continue to progress workloads to maintain intensity without signs/symptoms of physical distress.     Resistance Training   Training Prescription  Yes  Yes  Yes  Yes  Yes   Weight  orange bands  orange bands  orange bands  orange bands  orange bands   Reps  10-15  10-15  10-15  10-15  10-15   Time  10 Minutes  10 Minutes  10 Minutes  10 Minutes  10 Minutes     Oxygen   Oxygen  Continuous  Continuous  Continuous  Continuous  Continuous   Liters  4-6  4-6  4-6  4-6  4-6     Recumbant Bike   Level  -  4  4  -  2   Minutes  -  17  17  -  17     NuStep   Level  _2 Minutes  _3 METs  1.7  2  1.7  1.7  1.7     Track   Laps  8  -  _4 Minutes  17  -  _5 Home Exercise Plan   Plans to continue exercise at  -  -  Home (comment)  -  -   Frequency  -  -  Add 3 additional days to program exercise sessions.  -  -   Row Name 01/01/17 1533 01/06/17 1600 01/08/17 1500 01/13/17 1500 01/20/17 1500     Response to Exercise   Blood Pressure (Admit)  92/50  94/60  106/60  102/60  108/60   Blood Pressure (Exercise)  112/70  120/66  126/60  120/54  122/56   Blood Pressure (Exit)  96/60  95/64  112/72  100/62  96/56   Heart Rate (Admit)  59 bpm  62 bpm  58 bpm  59 bpm  67 bpm   Heart Rate (Exercise)  73 bpm  112 bpm  96 bpm  74 bpm  77 bpm   Heart Rate (Exit)  57 bpm  59 bpm  62 bpm  64 bpm  67 bpm   Oxygen Saturation (Admit)  93 %  94 %  89 %  90 %  88 %   Oxygen Saturation (Exercise)  91 %  87 % Sat at 87% on O2 3L.O2 increased to 6L sat improved to 91.  94 % Sat at 87% on O2 3L.O2 increased to 6L sat improved to 91.  93 %  89 %   Oxygen Saturation (Exit)  97 %  93 %  94 %  92 %  94 %   Rating of Perceived Exertion (Exercise)  _0 Perceived Dyspnea (Exercise)  1  -  _1 Symptoms  -  -  -  1  -   Duration  Progress to 45 minutes of aerobic exercise without signs/symptoms of physical distress  Progress to 45 minutes of aerobic exercise without signs/symptoms of physical distress  Progress to 45 minutes of aerobic exercise without signs/symptoms of physical distress  Progress to 45 minutes of aerobic exercise without signs/symptoms of physical distress  Progress to 45 minutes of aerobic exercise without signs/symptoms of physical distress   Intensity  THRR unchanged  THRR unchanged  THRR unchanged  THRR unchanged  THRR unchanged     Progression   Progression  Continue to progress workloads to maintain intensity without signs/symptoms of physical distress.  Continue to progress workloads to maintain intensity without signs/symptoms of physical distress.  Continue to progress workloads to maintain intensity without signs/symptoms of physical  distress.  Continue to progress workloads to maintain intensity without signs/symptoms of physical distress.  Continue to progress workloads to maintain intensity without signs/symptoms of physical distress.     Resistance Training   Training Prescription  Yes  Yes  Yes  Yes  Yes   Weight  orange bands  orange bands  orange bands  orange bANDS  orange bANDS   Reps  10-15  10-15  10-15  10-15  10-15   Time  10 Minutes  10 Minutes  10 Minutes  10 Minutes  10 Minutes     Oxygen   Oxygen  Continuous  Continuous  Continuous  -  Continuous   Liters  4-6  4-6  4-6  -  6     Recumbant Bike   Level  -  _2 Minutes  -  _3 NuStep   Level  -  4  -  -  5   Minutes  -  17  -  -  17   METs  -  2  -  -  1.7     Track   Laps  _4 Minutes  _5 Row Name 01/22/17 1500 01/27/17 1500 02/03/17 1500 02/05/17 1619 02/10/17 1500     Response to Exercise   Blood Pressure (Admit)  92/54  108/64  116/58  108/60  87/58 recheck after gator aide to drink BP 96/58   Blood Pressure (Exercise)  116/64  122/70  124/70  100/64  106/50   Blood Pressure (Exit)  110/70  96/62 111/75 after drinking water  112/78  98/62  80/46  Heart Rate (Admit)  63 bpm  69 bpm  67 bpm  61 bpm  64 bpm   Heart Rate (Exercise)  77 bpm  83 bpm  91 bpm  78 bpm  85 bpm   Heart Rate (Exit)  65 bpm  90 bpm  61 bpm  69 bpm  71 bpm   Oxygen Saturation (Admit)  90 %  89 %  89 %  98 %  90 %   Oxygen Saturation (Exercise)  90 %  89 %  89 %  94 %  90 %   Oxygen Saturation (Exit)  93 %  96 %  97 %  97 %  92 %   Rating of Perceived Exertion (Exercise)  _0 Perceived Dyspnea (Exercise)  _1 Duration  Progress to 45 minutes of aerobic exercise without signs/symptoms of physical distress  Progress to 45 minutes of aerobic exercise without signs/symptoms of physical distress  Progress to 45 minutes of aerobic exercise without signs/symptoms of physical distress  Progress  to 45 minutes of aerobic exercise without signs/symptoms of physical distress  Progress to 45 minutes of aerobic exercise without signs/symptoms of physical distress   Intensity  THRR unchanged  THRR unchanged  THRR unchanged  THRR unchanged  THRR unchanged     Progression   Progression  Continue to progress workloads to maintain intensity without signs/symptoms of physical distress.  Continue to progress workloads to maintain intensity without signs/symptoms of physical distress.  Continue to progress workloads to maintain intensity without signs/symptoms of physical distress.  Continue to progress workloads to maintain intensity without signs/symptoms of physical distress.  Continue to progress workloads to maintain intensity without signs/symptoms of physical distress.     Resistance Training   Training Prescription  Yes  Yes  Yes  Yes  Yes   Weight  orange bands  orange bands  orange bands  orange bands  orange bands   Reps  10-15  10-15  10-15  10-15  10-15   Time  10 Minutes  10 Minutes  10 Minutes  10 Minutes  10 Minutes     Oxygen   Oxygen  -  Continuous  Continuous  Continuous  Continuous   Liters  -  _2 Recumbant Bike   Level  _3 -  3   Minutes  _4 -  17     NuStep   Level  _5 Minutes  _6 METs  1.6  1.9  1.7  1.8  1.6     Track   Laps  -  _7 Minutes  -  _8 Row Name 02/12/17 1500 02/17/17 1500           Response to Exercise   Blood Pressure (Admit)  110/60  90/54      Blood Pressure (Exercise)  120/72  130/60      Blood Pressure (Exit)  100/64  100/56      Heart Rate (Admit)  61 bpm  61 bpm      Heart Rate (Exercise)  78 bpm  84 bpm  Heart Rate (Exit)  65 bpm  68 bpm      Oxygen Saturation (Admit)  94 %  92 %      Oxygen Saturation (Exercise)  89 %  90 %      Oxygen Saturation (Exit)  94 %  98 %      Rating of Perceived Exertion (Exercise)  13  14      Perceived Dyspnea  (Exercise)  2  3      Duration  Progress to 45 minutes of aerobic exercise without signs/symptoms of physical distress  Progress to 45 minutes of aerobic exercise without signs/symptoms of physical distress      Intensity  THRR unchanged  THRR unchanged        Progression   Progression  Continue to progress workloads to maintain intensity without signs/symptoms of physical distress.  Continue to progress workloads to maintain intensity without signs/symptoms of physical distress.        Resistance Training   Training Prescription  Yes  Yes      Weight  orange bands  orange bands      Reps  10-15  10-15      Time  10 Minutes  10 Minutes        Oxygen   Oxygen  Continuous  Continuous      Liters  6  6        Recumbant Bike   Level  -  4      Minutes  17  17        NuStep   Level  5  6      Minutes  17  17      METs  1.8  1.6        Track   Laps  -  9      Minutes  -  17         Exercise Comments: Exercise Comments    [place] Name 12/18/16 1543           Exercise Comments  home exercise completed          Exercise Goals and Review: Exercise Goals    [place] Name 11/07/16 1447             Exercise Goals   Increase Physical Activity  Yes       Intervention  Provide advice, education, support and counseling about physical activity/exercise needs.;Develop an individualized exercise prescription for aerobic and resistive training based on initial evaluation findings, risk stratification, comorbidities and participant's personal goals.       Expected Outcomes  Achievement of increased cardiorespiratory fitness and enhanced flexibility, muscular endurance and strength shown through measurements of functional capacity and personal statement of participant.       Increase Strength and Stamina  Yes       Intervention  Provide advice, education, support and counseling about physical activity/exercise needs.;Develop an individualized exercise prescription for aerobic and resistive  training based on initial evaluation findings, risk stratification, comorbidities and participant's personal goals.       Expected Outcomes  Achievement of increased cardiorespiratory fitness and enhanced flexibility, muscular endurance and strength shown through measurements of functional capacity and personal statement of participant.          Exercise Goals Re-Evaluation : Exercise Goals Re-Evaluation    Row Name 12/02/16 0944 12/22/16 1407 01/24/17 1349 02/16/17 1633       Exercise Goal Re-Evaluation   Exercise Goals Review  Increase Strength  and Stamina;Increase Physical Activity;Able to understand and use rate of perceived exertion (RPE) scale;Knowledge and understanding of Target Heart Rate Range (THRR);Understanding of Exercise Prescription;Able to understand and use Dyspnea scale  Increase Strength and Stamina;Able to understand and use Dyspnea scale;Increase Physical Activity;Able to understand and use rate of perceived exertion (RPE) scale;Knowledge and understanding of Target Heart Rate Range (THRR);Understanding of Exercise Prescription  Increase Strength and Stamina;Increase Physical Activity;Able to understand and use Dyspnea scale;Able to understand and use rate of perceived exertion (RPE) scale;Knowledge and understanding of Target Heart Rate Range (THRR);Understanding of Exercise Prescription  Increase Strength and Stamina;Increase Physical Activity;Able to understand and use rate of perceived exertion (RPE) scale;Knowledge and understanding of Target Heart Rate Range (THRR);Understanding of Exercise Prescription;Able to understand and use Dyspnea scale    Comments  Patient has only attended four exercise sessions. Will cont. to monitor and progress as able.   Patient is progressing slowly in program. She averages a MET level of 1.7-2.0. Will cont. to monitor and progress as able.   Patient is progressing slowly in program. She averages a MET level of 1.7-2.0. Will cont. to monitor and  progress as able.   We have extended patient to 36 sessions. Patient is showing slow and gradual improvements. Walking up to 13 laps (276f each lap) in 15 minutes. States that she sees improvement. Attendance is consistent. Will cont. to follow.    Expected Outcomes  Through exercising at rehab and at home, patient will increase physical strength and stamina and find ADL's easier to perform.   Through exercising at rehab and at home, patient will increase physical strength and stamina and find ADL's easier to perform.   Through exercising at rehab and at home, patient will increase physical strength and stamina and find ADL's easier to perform.   Through exercise at rehab and at home, patient will increase strength and stamina and will find that ADL's are easier to preform.        Discharge Exercise Prescription (Final Exercise Prescription Changes): Exercise Prescription Changes - 02/17/17 1500      Response to Exercise   Blood Pressure (Admit)  90/54    Blood Pressure (Exercise)  130/60    Blood Pressure (Exit)  100/56    Heart Rate (Admit)  61 bpm    Heart Rate (Exercise)  84 bpm    Heart Rate (Exit)  68 bpm    Oxygen Saturation (Admit)  92 %    Oxygen Saturation (Exercise)  90 %    Oxygen Saturation (Exit)  98 %    Rating of Perceived Exertion (Exercise)  14    Perceived Dyspnea (Exercise)  3    Duration  Progress to 45 minutes of aerobic exercise without signs/symptoms of physical distress    Intensity  THRR unchanged      Progression   Progression  Continue to progress workloads to maintain intensity without signs/symptoms of physical distress.      Resistance Training   Training Prescription  Yes    Weight  orange bands    Reps  10-15    Time  10 Minutes      Oxygen   Oxygen  Continuous    Liters  6      Recumbant Bike   Level  4    Minutes  17      NuStep   Level  6    Minutes  17    METs  1.6      Track  Laps  9    Minutes  17       Nutrition:  Target  Goals: Understanding of nutrition guidelines, daily intake of sodium <1544m, cholesterol <2060m calories 30% from fat and 7% or less from saturated fats, daily to have 5 or more servings of fruits and vegetables.  Biometrics: Pre Biometrics - 11/07/16 1543      Pre Biometrics   Grip Strength  23 kg        Nutrition Therapy Plan and Nutrition Goals: Nutrition Therapy & Goals - 01/01/17 1541      Nutrition Therapy   Diet  Therapeutic Lifestyle Changes      Personal Nutrition Goals   Nutrition Goal  Identify food quantities necessary to achieve wt loss of  -2# per week to a goal wt loss of 2.7-10.9 kg (6-24 lb) at graduation from pulmonary rehab.      Intervention Plan   Intervention  Prescribe, educate and counsel regarding individualized specific dietary modifications aiming towards targeted core components such as weight, hypertension, lipid management, diabetes, heart failure and other comorbidities.;Nutrition handout(s) given to patient. Pre-diabetes handout given    Expected Outcomes  Short Term Goal: Understand basic principles of dietary content, such as calories, fat, sodium, cholesterol and nutrients.;Long Term Goal: Adherence to prescribed nutrition plan.       Nutrition Discharge: Rate Your Plate Scores: Nutrition Assessments - 12/22/16 1036      Rate Your Plate Scores   Pre Score  52       Nutrition Goals Re-Evaluation:   Nutrition Goals Discharge (Final Nutrition Goals Re-Evaluation):   Psychosocial: Target Goals: Acknowledge presence or absence of significant depression and/or stress, maximize coping skills, provide positive support system. Participant is able to verbalize types and ability to use techniques and skills needed for reducing stress and depression.  Initial Review & Psychosocial Screening: Initial Psych Review & Screening - 11/07/16 1505      Initial Review   Current issues with  None Identified      Family Dynamics   Good Support System?   Yes    Concerns  Recent loss of significant other    Comments  lost husband 2 years ago. only married for 2 years      Barriers   Psychosocial barriers to participate in program  There are no identifiable barriers or psychosocial needs.      Screening Interventions   Interventions  Encouraged to exercise       Quality of Life Scores:   PHQ-9: Recent Review Flowsheet Data    [facility]2016 10/17/2016 10/13/2016 10/06/2016   Decreased Interest _0 Down, Depressed, Hopeless 0 0 1 1   PHQ - 2 Score _1 Altered sleeping - - 1 1   Tired, decreased energy - - 1 1   Change in appetite - - 0 0   Feeling bad or failure about yourself  - - 0 -   Trouble concentrating - - 0 0   Moving slowly or fidgety/restless - - 0 0   Suicidal thoughts - - 0 0   PHQ-9 Score - - 4 4   Difficult doing work/chores - - Not difficult at all -     Interpretation of Total Score  Total Score Depression Severity:  1-4 = Minimal depression, 5-9 = Mild depression, 10-14 = Moderate depression, 15-19 = Moderately severe depression, 20-27 = Severe depression   Psychosocial Evaluation and Intervention:  Psychosocial Evaluation - 11/07/16 1506      Psychosocial Evaluation & Interventions   Interventions  Encouraged to exercise with the program and follow exercise prescription    Continue Psychosocial Services   Follow up required by staff       Psychosocial Re-Evaluation: Psychosocial Re-Evaluation    [place] Name 12/03/16 2111 12/26/16 1355 01/26/17 0814 02/17/17 1009       Psychosocial Re-Evaluation   Current issues with  None Identified  None Identified  None Identified  None Identified    Comments  lost husband 2 years ago and has lost contact with his children which saddens her. she was only married for 2 years.  lost husband 2 years ago and has lost contact with his children which saddens her. she was only married for 2 years.  lost husband 2 years ago and has lost contact with  his children which saddens her. she was only married for 2 years.  lost husband 2 years ago and has lost contact with his children which saddens her. she was only married for 2 years.    Expected Outcomes  patient will remain free from psychosocial barriers to participation  patient will remain free from psychosocial barriers to participation  patient will remain free from psychosocial barriers to participation  patient will remain free from psychosocial barriers to participation    Interventions  Encouraged to attend Pulmonary Rehabilitation for the exercise  Encouraged to attend Pulmonary Rehabilitation for the exercise  Encouraged to attend Pulmonary Rehabilitation for the exercise  Encouraged to attend Pulmonary Rehabilitation for the exercise    Continue Psychosocial Services   Follow up required by staff  Follow up required by staff  Follow up required by staff  Follow up required by staff       Psychosocial Discharge (Final Psychosocial Re-Evaluation): Psychosocial Re-Evaluation - 02/17/17 1009      Psychosocial Re-Evaluation   Current issues with  None Identified    Comments  lost husband 2 years ago and has lost contact with his children which saddens her. she was only married for 2 years.    Expected Outcomes  patient will remain free from psychosocial barriers to participation    Interventions  Encouraged to attend Pulmonary Rehabilitation for the exercise    Continue Psychosocial Services   Follow up required by staff       Education: Education Goals: Education classes will be provided on a weekly basis, covering required topics. Participant will state understanding/return demonstration of topics presented.  Learning Barriers/Preferences: Learning Barriers/Preferences - 11/07/16 1501      Learning Barriers/Preferences   Learning Barriers  None    Learning Preferences  Group Instruction;Individual Instruction;Verbal Instruction;Written Material       Education Topics: Risk  Factor Reduction:  -Group instruction that is supported by a PowerPoint presentation. Instructor discusses the definition of a risk factor, different risk factors for pulmonary disease, and how the heart and lungs work together.     Nutrition for Pulmonary Patient:  -Group instruction provided by PowerPoint slides, verbal discussion, and written materials to support subject matter. The instructor gives an explanation and review of healthy diet recommendations, which includes a discussion on weight management, recommendations for fruit and vegetable consumption, as well as protein, fluid, caffeine, fiber, sodium, sugar, and alcohol. Tips for eating when patients are short of breath are discussed.   PULMONARY REHAB OTHER RESPIRATORY from 02/12/2017 in [place]  Date  11/27/16  Educator  edna  Instruction Review Code  2- meets goals/outcomes      Pursed Lip Breathing:  -Group instruction that is supported by demonstration and informational handouts. Instructor discusses the benefits of pursed lip and diaphragmatic breathing and detailed demonstration on how to preform both.     Oxygen Safety:  -Group instruction provided by PowerPoint, verbal discussion, and written material to support subject matter. There is an overview of "What is Oxygen" and "Why do we need it".  Instructor also reviews how to create a safe environment for oxygen use, the importance of using oxygen as prescribed, and the risks of noncompliance. There is a brief discussion on traveling with oxygen and resources the patient may utilize.   PULMONARY REHAB OTHER RESPIRATORY from 02/12/2017 in [place]  Date  11/13/16  Educator  Truddie Crumble  Instruction Review Code  2- meets goals/outcomes      Oxygen Equipment:  -Group instruction provided by [employer] Staff utilizing handouts, written materials, and equipment demonstrations.   Signs and Symptoms:  -Group  instruction provided by written material and verbal discussion to support subject matter. Warning signs and symptoms of infection, stroke, and heart attack are reviewed and when to call the physician/911 reinforced. Tips for preventing the spread of infection discussed.   PULMONARY REHAB OTHER RESPIRATORY from 02/12/2017 in [place]  Date  12/25/16  Educator  rn  Instruction Review Code  2- meets goals/outcomes      Advanced Directives:  -Group instruction provided by verbal instruction and written material to support subject matter. Instructor reviews Advanced Directive laws and proper instruction for filling out document.   Pulmonary Video:  -Group video education that reviews the importance of medication and oxygen compliance, exercise, good nutrition, pulmonary hygiene, and pursed lip and diaphragmatic breathing for the pulmonary patient.   PULMONARY REHAB OTHER RESPIRATORY from 02/12/2017 in [place]  Date  01/01/17  Instruction Review Code  2- meets goals/outcomes      Exercise for the Pulmonary Patient:  -Group instruction that is supported by a PowerPoint presentation. Instructor discusses benefits of exercise, core components of exercise, frequency, duration, and intensity of an exercise routine, importance of utilizing pulse oximetry during exercise, safety while exercising, and options of places to exercise outside of rehab.     PULMONARY REHAB OTHER RESPIRATORY from 02/12/2017 in [place]  Date  12/16/16  Educator  ep  Instruction Review Code  2- meets goals/outcomes      Pulmonary Medications:  -Verbally interactive group education provided by instructor with focus on inhaled medications and proper administration.   PULMONARY REHAB OTHER RESPIRATORY from 02/12/2017 in [place]  Date  02/05/17  Educator  pharmacist  Instruction Review Code   R- Review/reinforce      Anatomy and Physiology of the Respiratory System and Intimacy:  -Group instruction provided by PowerPoint, verbal discussion, and written material to support subject matter. Instructor reviews respiratory cycle and anatomical components of the respiratory system and their functions. Instructor also reviews differences in obstructive and restrictive respiratory diseases with examples of each. Intimacy, Sex, and Sexuality differences are reviewed with a discussion on how relationships can change when diagnosed with pulmonary disease. Common sexual concerns are reviewed.   MD DAY -A group question and answer session with a medical doctor that allows participants to ask questions that relate to their pulmonary disease state.   PULMONARY REHAB OTHER RESPIRATORY  from 02/12/2017 in [place]  Date  01/13/17  Educator  yacoub  Instruction Review Code  2- meets goals/outcomes      OTHER EDUCATION -Group or individual verbal, written, or video instructions that support the educational goals of the pulmonary rehab program.   PULMONARY REHAB OTHER RESPIRATORY from 02/12/2017 in [place]  Date  02/12/17 [[facility]]  Educator  Parke Simmers  Instruction Review Code  1- Verbalizes Understanding      Knowledge Questionnaire Score:   Core Components/Risk Factors/Patient Goals at Admission: Personal Goals and Risk Factors at Admission - 11/07/16 1504      Core Components/Risk Factors/Patient Goals on Admission   Improve shortness of breath with ADL's  Yes    Intervention  Provide education, individualized exercise plan and daily activity instruction to help decrease symptoms of SOB with activities of daily living.    Expected Outcomes  Short Term: Achieves a reduction of symptoms when performing activities of daily living.    Develop more efficient breathing techniques such as purse lipped breathing and  diaphragmatic breathing; and practicing self-pacing with activity  Yes    Intervention  Provide education, demonstration and support about specific breathing techniuqes utilized for more efficient breathing. Include techniques such as pursed lipped breathing, diaphragmatic breathing and self-pacing activity.    Expected Outcomes  Short Term: Participant will be able to demonstrate and use breathing techniques as needed throughout daily activities.    Increase knowledge of respiratory medications and ability to use respiratory devices properly   Yes    Intervention  Provide education and demonstration as needed of appropriate use of medications, inhalers, and oxygen therapy.    Expected Outcomes  Short Term: Achieves understanding of medications use. Understands that oxygen is a medication prescribed by physician. Demonstrates appropriate use of inhaler and oxygen therapy.       Core Components/Risk Factors/Patient Goals Review:  Goals and Risk Factor Review    Row Name 12/03/16 2109 12/26/16 1354 01/26/17 0813 02/17/17 1007       Core Components/Risk Factors/Patient Goals Review   Personal Goals Review  Improve shortness of breath with ADL's;Increase knowledge of respiratory medications and ability to use respiratory devices properly.;Develop more efficient breathing techniques such as purse lipped breathing and diaphragmatic breathing and practicing self-pacing with activity.  Improve shortness of breath with ADL's;Increase knowledge of respiratory medications and ability to use respiratory devices properly.;Develop more efficient breathing techniques such as purse lipped breathing and diaphragmatic breathing and practicing self-pacing with activity.  Improve shortness of breath with ADL's;Increase knowledge of respiratory medications and ability to use respiratory devices properly.;Develop more efficient breathing techniques such as purse lipped breathing and diaphragmatic breathing and practicing  self-pacing with activity.  Improve shortness of breath with ADL's;Increase knowledge of respiratory medications and ability to use respiratory devices properly.;Develop more efficient breathing techniques such as purse lipped breathing and diaphragmatic breathing and practicing self-pacing with activity.    Review  patient is beginning to use plb technique without prompting. she has not had any improvement in her shortness of breath at this point in rehab however she has missed several sessions.  patient is beginning to use plb technique without prompting. she has not had any improvement in her shortness of breath at this point in rehab however she has missed several sessions. will stress with patient importance of consistant attendance in order to improve stamina and strength as she wishes  patients attendace has been much improved  over the past 30 days. She is showing improvement in her SOB and utilizing PLB without cueing.  seeing slow improvement in strength and stamina, would benefit from extending to 36 sessions    Expected Outcomes  see admission outcomes  see admission outcomes  see admission outcomes  see admission outcomes       Core Components/Risk Factors/Patient Goals at Discharge (Final Review):  Goals and Risk Factor Review - 02/17/17 1007      Core Components/Risk Factors/Patient Goals Review   Personal Goals Review  Improve shortness of breath with ADL's;Increase knowledge of respiratory medications and ability to use respiratory devices properly.;Develop more efficient breathing techniques such as purse lipped breathing and diaphragmatic breathing and practicing self-pacing with activity.    Review  seeing slow improvement in strength and stamina, would benefit from extending to 36 sessions    Expected Outcomes  see admission outcomes       ITP Comments:   Comments:ITP REVIEW Pt is making expected progress toward pulmonary rehab goals after completing 22 sessions. Recommend  continued exercise, life style modification, education, and utilization of breathing techniques to increase stamina and strength and decrease shortness of breath with exertion.

## 2017-02-19 NOTE — Progress Notes (Signed)
Daily Session Note  Patient Details  Name: Amy [name] MRN: [id number] Date of Birth: [birth year] Referring Provider:     Pulmonary Rehab Walk Test from 11/11/2016 in [place]  Referring Provider  Dr. Aundra Dubin      Encounter Date: 02/19/2017  Check In: Session Check In - 02/19/17 1330      Check-In   Location  [facility]    Staff Present  Rosebud Poles, RN, BSN;Molly diVincenzo, MS, ACSM RCEP, Exercise Physiologist    Supervising physician immediately available to respond to emergencies  [facility] immediately available    Physician(s)  Dr. Broadus John    Medication changes reported      No    Fall or balance concerns reported     No    Tobacco Cessation  No Change    Warm-up and Cool-down  Performed as group-led instruction    Resistance Training Performed  Yes    VAD Patient?  No      Pain Assessment   Multiple Pain Sites  No       Capillary Blood Glucose: No results found for this or any previous visit (from the past 24 hour(s)).  Exercise Prescription Changes - 02/19/17 1500      Response to Exercise   Blood Pressure (Admit)  104/60    Blood Pressure (Exercise)  110/64    Blood Pressure (Exit)  100/60    Heart Rate (Admit)  76 bpm    Heart Rate (Exercise)  93 bpm    Heart Rate (Exit)  69 bpm    Oxygen Saturation (Admit)  93 %    Oxygen Saturation (Exercise)  91 %    Oxygen Saturation (Exit)  91 %    Rating of Perceived Exertion (Exercise)  17    Perceived Dyspnea (Exercise)  1    Duration  Progress to 45 minutes of aerobic exercise without signs/symptoms of physical distress    Intensity  THRR unchanged      Progression   Progression  Continue to progress workloads to maintain intensity without signs/symptoms of physical distress.      Resistance Training   Training Prescription  Yes    Weight  orange bands    Reps  10-15    Time  10 Minutes      Oxygen   Oxygen  Continuous      Recumbant Bike    Level  4    Minutes  17      NuStep   Level  6    Minutes  17    METs  1.5      Track   Laps  7    Minutes  17       Social History   Tobacco Use  Smoking Status Former Smoker  . Packs/day: 0.50  . Years: 44.00  . Pack years: 22.00  . Types: Cigarettes  . Last attempt to quit: 04/07/2009  . Years since quitting: 7.8  Smokeless Tobacco Never Used    Goals Met:  Exercise tolerated well Strength training completed today  Goals Unmet:  Not Applicable  Comments: Service time is from 1330 to 1500    Dr. Rush Farmer is Medical Director for Pulmonary Rehab at [facility].

## 2017-02-23 ENCOUNTER — Other Ambulatory Visit (HOSPITAL_COMMUNITY): Payer: Self-pay | Admitting: *Deleted

## 2017-02-23 ENCOUNTER — Other Ambulatory Visit (HOSPITAL_COMMUNITY): Payer: Self-pay

## 2017-02-23 MED ORDER — SELEXIPAG 200 MCG PO TABS: 1000.0000 ug | ORAL_TABLET | Freq: Two times a day (BID) | ORAL | 3 refills | Status: DC

## 2017-02-24 ENCOUNTER — Encounter (HOSPITAL_COMMUNITY): Payer: Medicare Other

## 2017-03-03 ENCOUNTER — Encounter (HOSPITAL_COMMUNITY)
Admission: RE | Admit: 2017-03-03 | Discharge: 2017-03-03 | Disposition: A | Discharge status: Home / Self Care | Payer: Medicare Other | Source: Ambulatory Visit | Attending: Cardiology | Admitting: Cardiology

## 2017-03-03 VITALS — Wt 188.1 lb

## 2017-03-03 DIAGNOSIS — I5032 Chronic diastolic (congestive) heart failure: Secondary | ICD-10-CM | POA: Diagnosis not present

## 2017-03-03 DIAGNOSIS — Z79899 Other long term (current) drug therapy: Secondary | ICD-10-CM | POA: Diagnosis not present

## 2017-03-03 DIAGNOSIS — I272 Pulmonary hypertension, unspecified: Secondary | ICD-10-CM

## 2017-03-03 DIAGNOSIS — I2510 Atherosclerotic heart disease of native coronary artery without angina pectoris: Secondary | ICD-10-CM | POA: Diagnosis not present

## 2017-03-03 DIAGNOSIS — Z7901 Long term (current) use of anticoagulants: Secondary | ICD-10-CM | POA: Diagnosis not present

## 2017-03-03 DIAGNOSIS — N183 Chronic kidney disease, stage 3 (moderate): Secondary | ICD-10-CM | POA: Diagnosis not present

## 2017-03-03 NOTE — Progress Notes (Signed)
Daily Session Note  Patient Details  Name: Amy [name] MRN: [id number] Date of Birth: 1946-03-12 Referring Provider:     Pulmonary Rehab Walk Test from 11/11/2016 in [place]  Referring Provider  Dr. Aundra Dubin      Encounter Date: 03/03/2017  Check In: Session Check In - 03/03/17 1330      Check-In   Location  [facility]    Staff Present  Rosebud Poles, RN, BSN;Molly diVincenzo, MS, ACSM RCEP, Exercise Physiologist;Mishawn Didion Rollene Rotunda, RN, Roque Cash, RN    Supervising physician immediately available to respond to emergencies  [facility] immediately available    Physician(s)  Dr. Maylene Roes    Medication changes reported      No    Fall or balance concerns reported     No    Tobacco Cessation  No Change    Warm-up and Cool-down  Performed as group-led instruction    Resistance Training Performed  Yes    VAD Patient?  No      Pain Assessment   Currently in Pain?  No/denies    Multiple Pain Sites  No       Capillary Blood Glucose: No results found for this or any previous visit (from the past 24 hour(s)).  Exercise Prescription Changes - 03/03/17 1524      Response to Exercise   Blood Pressure (Admit)  98/42    Blood Pressure (Exercise)  120/60    Blood Pressure (Exit)  100/54    Heart Rate (Admit)  69 bpm    Heart Rate (Exercise)  82 bpm    Heart Rate (Exit)  66 bpm    Oxygen Saturation (Admit)  90 %    Oxygen Saturation (Exercise)  87 %    Oxygen Saturation (Exit)  90 %    Rating of Perceived Exertion (Exercise)  15    Perceived Dyspnea (Exercise)  3    Duration  Progress to 45 minutes of aerobic exercise without signs/symptoms of physical distress    Intensity  THRR unchanged      Progression   Progression  Continue to progress workloads to maintain intensity without signs/symptoms of physical distress.      Resistance Training   Training Prescription  Yes    Weight  orange bands    Reps  10-15    Time   10 Minutes      Oxygen   Oxygen  Continuous    Liters  6-8      Recumbant Bike   Level  4    Minutes  17      NuStep   Level  6    Minutes  17    METs  1.7      Track   Laps  7    Minutes  17       Social History   Tobacco Use  Smoking Status Former Smoker  . Packs/day: 0.50  . Years: 44.00  . Pack years: 22.00  . Types: Cigarettes  . Last attempt to quit: 04/07/2009  . Years since quitting: 7.9  Smokeless Tobacco Never Used    Goals Met:  Independence with exercise equipment Improved SOB with ADL's Exercise tolerated well No report of cardiac concerns or symptoms Strength training completed today  Goals Unmet:  Not Applicable  Comments: Service time is from 1330 to 1515   Dr. Rush Farmer is Medical Director for Pulmonary Rehab at [facility].

## 2017-03-04 ENCOUNTER — Telehealth (HOSPITAL_COMMUNITY): Payer: Self-pay | Admitting: Pharmacist

## 2017-03-04 NOTE — Telephone Encounter (Signed)
[employer] exception granted by [employer].   Ruta Hinds. Velva Harman, PharmD, BCPS, CPP Clinical Pharmacist Pager: [phone] Phone: [phone] 03/04/2017 4:55 PM

## 2017-03-05 ENCOUNTER — Encounter (HOSPITAL_COMMUNITY)
Admission: RE | Admit: 2017-03-05 | Discharge: 2017-03-05 | Disposition: A | Discharge status: Home / Self Care | Payer: Medicare Other | Source: Ambulatory Visit | Attending: Cardiology | Admitting: Cardiology

## 2017-03-05 VITALS — Wt 186.9 lb

## 2017-03-05 DIAGNOSIS — Z7901 Long term (current) use of anticoagulants: Secondary | ICD-10-CM | POA: Diagnosis not present

## 2017-03-05 DIAGNOSIS — I5032 Chronic diastolic (congestive) heart failure: Secondary | ICD-10-CM | POA: Diagnosis not present

## 2017-03-05 DIAGNOSIS — N183 Chronic kidney disease, stage 3 (moderate): Secondary | ICD-10-CM | POA: Diagnosis not present

## 2017-03-05 DIAGNOSIS — Z79899 Other long term (current) drug therapy: Secondary | ICD-10-CM | POA: Diagnosis not present

## 2017-03-05 DIAGNOSIS — I272 Pulmonary hypertension, unspecified: Secondary | ICD-10-CM | POA: Diagnosis not present

## 2017-03-05 DIAGNOSIS — I2510 Atherosclerotic heart disease of native coronary artery without angina pectoris: Secondary | ICD-10-CM | POA: Diagnosis not present

## 2017-03-05 NOTE — Progress Notes (Signed)
Daily Session Note  Patient Details  Name: Amy [name] MRN: [id number] Date of Birth: 22-Dec-1945 Referring Provider:     Pulmonary Rehab Walk Test from 11/11/2016 in [place]  Referring Provider  Dr. Aundra Dubin      Encounter Date: 03/05/2017  Check In: Session Check In - 03/05/17 1330      Check-In   Location  [facility]    Staff Present  Rosebud Poles, RN, BSN;Molly diVincenzo, MS, ACSM RCEP, Exercise Physiologist;Lisa Ysidro Evert, RN;Shahir Karen Rollene Rotunda, RN, BSN    Supervising physician immediately available to respond to emergencies  [facility] immediately available    Physician(s)  Dr. Nevada Crane    Medication changes reported      No    Fall or balance concerns reported     No    Tobacco Cessation  No Change    Warm-up and Cool-down  Performed as group-led instruction    Resistance Training Performed  Yes    VAD Patient?  No      Pain Assessment   Currently in Pain?  No/denies    Multiple Pain Sites  No       Capillary Blood Glucose: No results found for this or any previous visit (from the past 24 hour(s)).  Exercise Prescription Changes - 03/05/17 1500      Response to Exercise   Blood Pressure (Admit)  110/52    Blood Pressure (Exercise)  110/66    Blood Pressure (Exit)  100/60    Heart Rate (Admit)  65 bpm    Heart Rate (Exercise)  80 bpm    Heart Rate (Exit)  63 bpm    Oxygen Saturation (Admit)  90 %    Oxygen Saturation (Exercise)  88 %    Oxygen Saturation (Exit)  91 %    Rating of Perceived Exertion (Exercise)  13    Perceived Dyspnea (Exercise)  1    Duration  Progress to 45 minutes of aerobic exercise without signs/symptoms of physical distress    Intensity  THRR unchanged      Progression   Progression  Continue to progress workloads to maintain intensity without signs/symptoms of physical distress.      Resistance Training   Training Prescription  Yes    Weight  orange bands    Reps  10-15    Time   10 Minutes      Oxygen   Oxygen  Continuous    Liters  6-8      NuStep   Level  6    Minutes  17    METs  1.8      Track   Laps  10    Minutes  17       Social History   Tobacco Use  Smoking Status Former Smoker  . Packs/day: 0.50  . Years: 44.00  . Pack years: 22.00  . Types: Cigarettes  . Last attempt to quit: 04/07/2009  . Years since quitting: 7.9  Smokeless Tobacco Never Used    Goals Met:  Independence with exercise equipment Improved SOB with ADL's Using PLB without cueing & demonstrates good technique Exercise tolerated well No report of cardiac concerns or symptoms Strength training completed today  Goals Unmet:  Not Applicable  Comments: Service time is from 1330 to 1535   Dr. Rush Farmer is Medical Director for Pulmonary Rehab at [facility].

## 2017-03-06 ENCOUNTER — Telehealth (HOSPITAL_COMMUNITY): Payer: Self-pay

## 2017-03-06 NOTE — Telephone Encounter (Signed)
Per discussion with Dr. Aundra Dubin, please have patient decrease back to Uptravi 1000 mcg BID.   Ruta Hinds. Velva Harman, PharmD, BCPS, CPP Clinical Pharmacist Pager: [phone] Phone: [phone] 03/06/2017 3:08 PM

## 2017-03-06 NOTE — Telephone Encounter (Signed)
Advanced Heart Failure Triage Encounter  Patient Name: Amy [name]  Date of Call: 03/06/17  Problem:  Pt is on [employer] and is experiencing light headiness, dizziness, low BP, and loss of energy on 1200 mcg BID (for about a week and a half), Pt say her BP has been as low as 88/42 (only once); 98/42,110/25, and 110/60.   Plan:    Shirley Muscat, RN

## 2017-03-09 NOTE — Telephone Encounter (Signed)
Pt agreeable

## 2017-03-10 ENCOUNTER — Encounter (HOSPITAL_COMMUNITY)
Admission: RE | Admit: 2017-03-10 | Discharge: 2017-03-10 | Disposition: A | Discharge status: Home / Self Care | Payer: Medicare Other | Source: Ambulatory Visit | Attending: Cardiology | Admitting: Cardiology

## 2017-03-10 VITALS — Wt 188.9 lb

## 2017-03-10 DIAGNOSIS — H3530 Unspecified macular degeneration: Secondary | ICD-10-CM | POA: Diagnosis not present

## 2017-03-10 DIAGNOSIS — E785 Hyperlipidemia, unspecified: Secondary | ICD-10-CM | POA: Diagnosis not present

## 2017-03-10 DIAGNOSIS — G4733 Obstructive sleep apnea (adult) (pediatric): Secondary | ICD-10-CM | POA: Diagnosis not present

## 2017-03-10 DIAGNOSIS — I130 Hypertensive heart and chronic kidney disease with heart failure and stage 1 through stage 4 chronic kidney disease, or unspecified chronic kidney disease: Secondary | ICD-10-CM | POA: Diagnosis not present

## 2017-03-10 DIAGNOSIS — Z87891 Personal history of nicotine dependence: Secondary | ICD-10-CM | POA: Insufficient documentation

## 2017-03-10 DIAGNOSIS — M519 Unspecified thoracic, thoracolumbar and lumbosacral intervertebral disc disorder: Secondary | ICD-10-CM | POA: Diagnosis not present

## 2017-03-10 DIAGNOSIS — I2510 Atherosclerotic heart disease of native coronary artery without angina pectoris: Secondary | ICD-10-CM | POA: Diagnosis not present

## 2017-03-10 DIAGNOSIS — E039 Hypothyroidism, unspecified: Secondary | ICD-10-CM | POA: Insufficient documentation

## 2017-03-10 DIAGNOSIS — J449 Chronic obstructive pulmonary disease, unspecified: Secondary | ICD-10-CM | POA: Insufficient documentation

## 2017-03-10 DIAGNOSIS — N183 Chronic kidney disease, stage 3 (moderate): Secondary | ICD-10-CM | POA: Diagnosis not present

## 2017-03-10 DIAGNOSIS — Z79899 Other long term (current) drug therapy: Secondary | ICD-10-CM | POA: Insufficient documentation

## 2017-03-10 DIAGNOSIS — K219 Gastro-esophageal reflux disease without esophagitis: Secondary | ICD-10-CM | POA: Insufficient documentation

## 2017-03-10 DIAGNOSIS — I481 Persistent atrial fibrillation: Secondary | ICD-10-CM | POA: Diagnosis not present

## 2017-03-10 DIAGNOSIS — I5032 Chronic diastolic (congestive) heart failure: Secondary | ICD-10-CM | POA: Diagnosis not present

## 2017-03-10 DIAGNOSIS — Z7901 Long term (current) use of anticoagulants: Secondary | ICD-10-CM | POA: Diagnosis not present

## 2017-03-10 DIAGNOSIS — R001 Bradycardia, unspecified: Secondary | ICD-10-CM | POA: Diagnosis not present

## 2017-03-10 DIAGNOSIS — I272 Pulmonary hypertension, unspecified: Secondary | ICD-10-CM | POA: Diagnosis not present

## 2017-03-10 NOTE — Progress Notes (Signed)
Daily Session Note  Patient Details  Name: Amy [name] MRN: [id number] Date of Birth: June 01, 1945 Referring Provider:     Pulmonary Rehab Walk Test from 11/11/2016 in [place]  Referring Provider  Dr. Aundra Dubin      Encounter Date: 03/10/2017  Check In: Session Check In - 03/10/17 1332      Check-In   Location  [facility]    Staff Present  Su Hilt, MS, ACSM RCEP, Exercise Physiologist;Lisa Ysidro Evert, RN;Orville Mena [place], RN, Maxcine Ham, RN, BSN    Supervising physician immediately available to respond to emergencies  [facility] immediately available    Physician(s)  Dr. [place] Crane    Medication changes reported      No    Fall or balance concerns reported     No    Tobacco Cessation  No Change    Warm-up and Cool-down  Performed as group-led instruction    Resistance Training Performed  Yes    VAD Patient?  No      Pain Assessment   Currently in Pain?  No/denies    Multiple Pain Sites  No       Capillary Blood Glucose: No results found for this or any previous visit (from the past 24 hour(s)).  Exercise Prescription Changes - 03/10/17 1519      Response to Exercise   Blood Pressure (Admit)  94/42    Blood Pressure (Exercise)  108/60    Blood Pressure (Exit)  118/70    Heart Rate (Admit)  77 bpm    Heart Rate (Exercise)  76 bpm    Heart Rate (Exit)  64 bpm    Oxygen Saturation (Admit)  88 %    Oxygen Saturation (Exercise)  88 %    Oxygen Saturation (Exit)  89 %    Rating of Perceived Exertion (Exercise)  15    Perceived Dyspnea (Exercise)  3    Symptoms       Duration  Progress to 45 minutes of aerobic exercise without signs/symptoms of physical distress    Intensity  THRR unchanged      Progression   Progression  Continue to progress workloads to maintain intensity without signs/symptoms of physical distress.      Resistance Training   Training Prescription  Yes    Weight  orange bands    Reps   10-15    Time  10 Minutes      Oxygen   Oxygen  Continuous    Liters  6-8      Recumbant Bike   Level  4    Minutes  17      NuStep   Level  4    Minutes  17    METs  1.4      Track   Laps  4    Minutes  17       Social History   Tobacco Use  Smoking Status Former Smoker  . Packs/day: 0.50  . Years: 44.00  . Pack years: 22.00  . Types: Cigarettes  . Last attempt to quit: 04/07/2009  . Years since quitting: 7.9  Smokeless Tobacco Never Used    Goals Met:  Independence with exercise equipment Improved SOB with ADL's Using PLB without cueing & demonstrates good technique Queuing for purse lip breathing No report of cardiac concerns or symptoms Strength training completed today  Goals Unmet:  Not Applicable  Comments: Service time is from 1330 to 1510  Dr. Rush Farmer is Medical Director for Pulmonary Rehab at [facility].

## 2017-03-12 ENCOUNTER — Encounter (HOSPITAL_COMMUNITY)
Admission: RE | Admit: 2017-03-12 | Discharge: 2017-03-12 | Disposition: A | Discharge status: Home / Self Care | Payer: Medicare Other | Source: Ambulatory Visit

## 2017-03-12 ENCOUNTER — Other Ambulatory Visit: Payer: Self-pay | Admitting: *Deleted

## 2017-03-12 ENCOUNTER — Other Ambulatory Visit: Payer: Self-pay | Admitting: Cardiology

## 2017-03-12 DIAGNOSIS — I272 Pulmonary hypertension, unspecified: Secondary | ICD-10-CM

## 2017-03-12 NOTE — Progress Notes (Signed)
Pulmonary Individual Treatment Plan  Patient Details  Name: Amy [name] MRN: [id number] Date of Birth: [birth year] Referring Provider:     Pulmonary Rehab Walk Test from 11/11/2016 in [facility]  Referring Provider  Dr. Aundra Dubin      Initial Encounter Date:    Pulmonary Rehab Walk Test from 11/11/2016 in [facility]  Date  11/11/16  Referring Provider  Dr. Aundra Dubin      Visit Diagnosis: Pulmonary hypertension ([place])  Patient's Home Medications on Admission:   Current Outpatient Medications:  .  acetaminophen (TYLENOL) 325 MG tablet, Take 2 tablets (650 mg total) by mouth every 4 (four) hours as needed for headache or mild pain., Disp: , Rfl:  .  atorvastatin (LIPITOR) 80 MG tablet, Take 1 tablet (80 mg total) by mouth daily with lunch., Disp: 90 tablet, Rfl: 3 .  Cholecalciferol (VITAMIN D) 2000 UNITS CAPS, Take 2,000 Units by mouth daily. , Disp: , Rfl:  .  colchicine 0.6 MG tablet, Take 0.6 mg by mouth daily. , Disp: , Rfl:  .  cyclobenzaprine (FLEXERIL) 5 MG tablet, Take 1 tablet (5 mg total) by mouth 3 (three) times daily as needed for muscle spasms., Disp: 10 tablet, Rfl: 0 .  ezetimibe (ZETIA) 10 MG tablet, TAKE 1 TABLET (10 MG TOTAL) BY MOUTH IN THE EVENING, Disp: 90 tablet, Rfl: 3 .  furosemide (LASIX) 20 MG tablet, Take 1 tablet (20 mg total) by mouth daily., Disp: 30 tablet, Rfl: 6 .  Glycopyrrolate-Formoterol (BEVESPI AEROSPHERE) 9-4.8 MCG/ACT AERO, Inhale 1 puff into the lungs 2 (two) times daily., Disp: , Rfl:  .  levothyroxine (SYNTHROID, LEVOTHROID) 100 MCG tablet, Take 100 mcg by mouth daily before breakfast., Disp: , Rfl:  .  Macitentan (OPSUMIT) 10 MG TABS, Take 1 tablet (10 mg total) by mouth daily., Disp: 30 tablet, Rfl: 11 .  metoprolol tartrate (LOPRESSOR) 25 MG tablet, Take 0.5 tablets (12.5 mg total) by mouth daily as needed., Disp: 15 tablet, Rfl: 11 .  MULTAQ 400 MG tablet, TAKE 1 TABLET BY MOUTH  TWICE A DAY WITH A MEAL, Disp: 180 tablet, Rfl: 3 .  Multiple Vitamin (MULTIVITAMIN) capsule, Take 1 capsule by mouth daily., Disp: , Rfl:  .  pantoprazole (PROTONIX) 40 MG tablet, TAKE 1 TABLET BY MOUTH DAILY, Disp: 90 tablet, Rfl: 2 .  potassium chloride SA (K-DUR,KLOR-CON) 20 MEQ tablet, Take 0.5 tablets (10 mEq total) by mouth at bedtime., Disp: , Rfl:  .  Rivaroxaban (XARELTO) 15 MG TABS tablet, Take 1 tablet (15 mg total) by mouth daily with supper., Disp: 30 tablet, Rfl: 11 .  Selexipag (UPTRAVI) 200 MCG TABS, Take 5 tablets (1,000 mcg total) 2 (two) times daily by mouth., Disp: 300 tablet, Rfl: 3 .  UNABLE TO FIND, Med Name: CPAP  DME-[facility], Disp: , Rfl:   Past Medical History: Past Medical History:  Diagnosis Date  . Apical variant hypertrophic cardiomyopathy ([place])    Diagnosed by echo and ECG 11/13  . Atypical atrial flutter ([place])   . CAD (coronary artery disease)    a. LHC in 10/2011 demonstrated non-obstructive disease and an 80% lesion in a small D1 treated medically.   . CHF (congestive heart failure) ([place])   . Chronic diastolic heart failure ([place])   . CKD (chronic kidney disease), stage III ([place])   . COPD (chronic obstructive pulmonary disease) ([place])   . Dizziness    had PT for being off balance - in approx.  2012  . GERD (gastroesophageal reflux disease)   . Hyperlipidemia   . Hypertension   . Hypothyroid   . Lumbar disc disease   . Macular degeneration   . OSA (obstructive sleep apnea)   . Persistent atrial fibrillation ([place])       . Pulmonary hypertension ([place])    a. [place] 05/2013 showed mild PAH with normal PCWP and RA pressure, could be related to OSA and low oxygen saturation.   . Refusal of blood transfusions as patient is Jehovah's Witness   . Sinus bradycardia     Tobacco Use: Social History   Tobacco Use  Smoking Status Former Smoker  . Packs/day: 0.50  . Years: 44.00  . Pack years: 22.00  . Types: Cigarettes  . Last attempt to quit: 04/07/2009  . Years  since quitting: 7.9  Smokeless Tobacco Never Used    Labs: Recent Review Flowsheet Data    Labs for ITP Cardiac and Pulmonary Rehab Latest Ref Rng & Units 09/27/2014 10/28/2014 06/25/2016 08/29/2016 09/12/2016   Cholestrol 100 - 199 mg/dL 137 - - 126 105   LDLCALC 0 - 99 mg/dL 72 - - 59 49   LDLDIRECT mg/dL - - - - -   HDL >39 mg/dL 49.50 - - 44 41   Trlycerides 0 - 149 mg/dL 76.0 - - 117 76   Hemoglobin A1c 4.8 - 5.6 % - 6.1(H) - - -   PHART 7.350 - 7.450 - - - - -   PCO2ART 35.0 - 45.0 mmHg - - - - -   HCO3 20.0 - 28.0 mmol/L - - 21.4 - -   TCO2 0 - 100 mmol/L - - 23 - -   ACIDBASEDEF 0.0 - 2.0 mmol/L - - 3.0(H) - -   O2SAT % - - 57.0 - -      Capillary Blood Glucose: No results found for: GLUCAP   Pulmonary Assessment Scores: Pulmonary Assessment Scores    Row Name 11/11/16 1646         ADL UCSD   ADL Phase  Entry       mMRC Score   mMRC Score  4        Pulmonary Function Assessment: Pulmonary Function Assessment - 11/07/16 1502      Breath   Bilateral Breath Sounds  Clear    Shortness of Breath  Yes;Limiting activity       Exercise Target Goals:    Exercise Program Goal: Individual exercise prescription set with THRR, safety & activity barriers. Participant demonstrates ability to understand and report RPE using BORG scale, to self-measure pulse accurately, and to acknowledge the importance of the exercise prescription.  Exercise Prescription Goal: Starting with aerobic activity 30 plus minutes a day, 3 days per week for initial exercise prescription. Provide home exercise prescription and guidelines that participant acknowledges understanding prior to discharge.  Activity Barriers & Risk Stratification:   6 Minute Walk: 6 Minute Walk    Row Name 11/11/16 1643         6 Minute Walk   Phase  Initial     Distance  730 feet     Walk Time  6 minutes     # of Rest Breaks  0     MPH  1.38     METS  2.07     RPE  13     Perceived Dyspnea   3      Symptoms  Yes (comment)     Comments  wheelchair     Resting HR  66 bpm     Resting BP  110/73     Max Ex. HR  89 bpm     Max Ex. BP  113/74       Interval HR   Baseline HR (retired)  66     1 Minute HR  72     2 Minute HR  90     3 Minute HR  89     4 Minute HR  87     5 Minute HR  89     6 Minute HR  86     2 Minute Post HR  79     Interval Heart Rate?  Yes       Interval Oxygen   Interval Oxygen?  Yes     Baseline Oxygen Saturation %  85 %     Resting Liters of Oxygen  3 L     1 Minute Oxygen Saturation %  90 %     1 Minute Liters of Oxygen  4 L     2 Minute Oxygen Saturation %  89 %     2 Minute Liters of Oxygen  4 L     3 Minute Oxygen Saturation %  88 %     3 Minute Liters of Oxygen  4 L     4 Minute Oxygen Saturation %  89 %     4 Minute Liters of Oxygen  4 L     5 Minute Oxygen Saturation %  88 %     5 Minute Liters of Oxygen  4 L     6 Minute Oxygen Saturation %  85 %     6 Minute Liters of Oxygen  4 L     2 Minute Post Oxygen Saturation %  92 %     2 Minute Post Liters of Oxygen  4 L        Oxygen Initial Assessment: Oxygen Initial Assessment - 11/11/16 1645      Initial 6 min Walk   Oxygen Used  Continuous;E-Tanks    Liters per minute  4    Resting Oxygen Saturation   94 %    Exercise Oxygen Saturation  during 6 min walk  85 %      Program Oxygen Prescription   Program Oxygen Prescription  Continuous;E-Tanks    Liters per minute  -- 4-6    Comments  needs depending on activity       Oxygen Re-Evaluation: Oxygen Re-Evaluation    Row Name 12/03/16 2107 12/26/16 1352 01/26/17 0812 02/17/17 1004 03/12/17 0913     Program Oxygen Prescription   Program Oxygen Prescription  Continuous;E-Tanks  Continuous;E-Tanks  Continuous;E-Tanks  Continuous;E-Tanks  Continuous;E-Tanks   Liters per minute  -  -  -  6  -   Comments  needs 4 to 6 liters depending on activity  needs 4 to 6 liters depending on activity  needs 4 to 6 liters depending on activity  needs 4  to 6 liters depending on activity  needs 4 to 6 liters depending on activity     Home Oxygen   Home Oxygen Device  Home Concentrator;E-Tanks  Home Concentrator;E-Tanks  Home Concentrator;E-Tanks  Home Concentrator;E-Tanks  Home Concentrator;E-Tanks   Sleep Oxygen Prescription  CPAP  CPAP  CPAP  CPAP  CPAP   Liters per minute  _0 Home Exercise Oxygen Prescription  Continuous  Continuous  Continuous  Continuous  Continuous   Liters per minute  - 4 to 6  - 4-6  -  6  6   Home at Rest Exercise Oxygen Prescription  None  Continuous  Continuous  Continuous  Continuous   Liters per minute  _0 Compliance with Home Oxygen Use  Yes  Yes  Yes  Yes  Yes     Goals/Expected Outcomes   Short Term Goals  To learn and understand importance of monitoring SPO2 with pulse oximeter and demonstrate accurate use of the pulse oximeter.;To learn and understand importance of maintaining oxygen saturations>88%;To learn and exhibit compliance with exercise, home and travel O2 prescription;To learn and demonstrate proper pursed lip breathing techniques or other breathing techniques.;To learn and demonstrate proper use of respiratory medications  To learn and understand importance of monitoring SPO2 with pulse oximeter and demonstrate accurate use of the pulse oximeter.;To learn and understand importance of maintaining oxygen saturations>88%;To learn and exhibit compliance with exercise, home and travel O2 prescription;To learn and demonstrate proper pursed lip breathing techniques or other breathing techniques.;To learn and demonstrate proper use of respiratory medications  To learn and understand importance of monitoring SPO2 with pulse oximeter and demonstrate accurate use of the pulse oximeter.;To learn and understand importance of maintaining oxygen saturations>88%;To learn and exhibit compliance with exercise, home and travel O2 prescription;To learn and demonstrate proper pursed lip breathing  techniques or other breathing techniques.;To learn and demonstrate proper use of respiratory medications  To learn and understand importance of monitoring SPO2 with pulse oximeter and demonstrate accurate use of the pulse oximeter.;To learn and understand importance of maintaining oxygen saturations>88%;To learn and exhibit compliance with exercise, home and travel O2 prescription;To learn and demonstrate proper pursed lip breathing techniques or other breathing techniques.;To learn and demonstrate proper use of respiratory medications  To learn and exhibit compliance with exercise, home and travel O2 prescription   Long  Term Goals  Exhibits compliance with exercise, home and travel O2 prescription;Verbalizes importance of monitoring SPO2 with pulse oximeter and return demonstration;Maintenance of O2 saturations>88%;Exhibits proper breathing techniques, such as pursed lip breathing or other method taught during program session;Compliance with respiratory medication  Exhibits compliance with exercise, home and travel O2 prescription;Verbalizes importance of monitoring SPO2 with pulse oximeter and return demonstration;Maintenance of O2 saturations>88%;Exhibits proper breathing techniques, such as pursed lip breathing or other method taught during program session;Compliance with respiratory medication  Exhibits compliance with exercise, home and travel O2 prescription;Verbalizes importance of monitoring SPO2 with pulse oximeter and return demonstration;Maintenance of O2 saturations>88%;Exhibits proper breathing techniques, such as pursed lip breathing or other method taught during program session;Compliance with respiratory medication  Exhibits compliance with exercise, home and travel O2 prescription;Verbalizes importance of monitoring SPO2 with pulse oximeter and return demonstration;Maintenance of O2 saturations>88%;Exhibits proper breathing techniques, such as pursed lip breathing or other method taught  during program session;Compliance with respiratory medication  Exhibits compliance with exercise, home and travel O2 prescription   Comments  -  patient verbalizes compliance with home oxygen use  patient verbalizes compliance with home oxygen use  exhibits compliance with oxygen  exhibits compliance with oxygen   Goals/Expected Outcomes  -  see above  see above  continued compliance with oxygen useage  compliance with oxygen      Oxygen Discharge (Final Oxygen Re-Evaluation): Oxygen Re-Evaluation - 03/12/17 0913      Program Oxygen Prescription   Program Oxygen  Prescription  Continuous;E-Tanks    Comments  needs 4 to 6 liters depending on activity      Home Oxygen   Home Oxygen Device  Home Concentrator;E-Tanks    Sleep Oxygen Prescription  CPAP    Liters per minute  3    Home Exercise Oxygen Prescription  Continuous    Liters per minute  6    Home at Rest Exercise Oxygen Prescription  Continuous    Liters per minute  6    Compliance with Home Oxygen Use  Yes      Goals/Expected Outcomes   Short Term Goals  To learn and exhibit compliance with exercise, home and travel O2 prescription    Long  Term Goals  Exhibits compliance with exercise, home and travel O2 prescription    Comments  exhibits compliance with oxygen    Goals/Expected Outcomes  compliance with oxygen       Initial Exercise Prescription: Initial Exercise Prescription - 11/11/16 1600      Date of Initial Exercise RX and Referring Provider   Date  11/11/16    Referring Provider  Dr. Aundra Dubin      Oxygen   Oxygen  Continuous    Liters  -- 4-6      Recumbant Bike   Level  1    Minutes  17      NuStep   Level  1    Minutes  17    METs  1.3      Track   Laps  4    Minutes  17      Prescription Details   Frequency (times per week)  2    Duration  Progress to 45 minutes of aerobic exercise without signs/symptoms of physical distress      Intensity   THRR 40-80% of Max Heartrate  60-119    Ratings of  Perceived Exertion  11-13    Perceived Dyspnea  0-4      Progression   Progression  Continue to progress workloads to maintain intensity without signs/symptoms of physical distress.      Resistance Training   Training Prescription  Yes    Weight  orange bands    Reps  10-15       Perform Capillary Blood Glucose checks as needed.  Exercise Prescription Changes: Exercise Prescription Changes    Row Name 11/13/16 1600 11/18/16 1500 11/20/16 1500 11/27/16 1500 12/02/16 1545     Response to Exercise   Blood Pressure (Admit)  98/54  110/70  130/68  96/54  102/58   Blood Pressure (Exercise)  106/60  132/80  118/56  128/70  100/62   Blood Pressure (Exit)  102/58  104/64  110/70  106/70  102/62   Heart Rate (Admit)  62 bpm  54 bpm  61 bpm  63 bpm  65 bpm   Heart Rate (Exercise)  73 bpm  72 bpm  76 bpm  77 bpm  73 bpm   Heart Rate (Exit)  63 bpm  54 bpm  63 bpm  60 bpm  64 bpm   Oxygen Saturation (Admit)  89 %  88 % increased O2 from 3L to 4L SaO2 inc to 90%   92 %  91 %  94 %   Oxygen Saturation (Exercise)  88 %  88 % inc O2 from 4L to 6L, SaO2 inc to 94%  87 % O2 increased to 6L sat up to 89  90 %  90 %  Oxygen Saturation (Exit)  93 %  98 % 6L O2  97 %  95 %  91 %   Rating of Perceived Exertion (Exercise)  _0 Perceived Dyspnea (Exercise)  _1 Duration  Progress to 45 minutes of aerobic exercise without signs/symptoms of physical distress  Progress to 45 minutes of aerobic exercise without signs/symptoms of physical distress  Progress to 45 minutes of aerobic exercise without signs/symptoms of physical distress  Progress to 45 minutes of aerobic exercise without signs/symptoms of physical distress  Progress to 45 minutes of aerobic exercise without signs/symptoms of physical distress   Intensity  THRR unchanged  THRR unchanged  THRR unchanged  THRR unchanged  THRR unchanged     Progression   Progression  Continue to progress workloads to maintain intensity  without signs/symptoms of physical distress.  Continue to progress workloads to maintain intensity without signs/symptoms of physical distress.  Continue to progress workloads to maintain intensity without signs/symptoms of physical distress.  Continue to progress workloads to maintain intensity without signs/symptoms of physical distress.  Continue to progress workloads to maintain intensity without signs/symptoms of physical distress.     Resistance Training   Training Prescription  Yes  Yes  Yes  Yes  Yes   Weight  orange bands  orange bands  orange bands  orange bands  orange bands   Reps  10-15  10-15  10-15  10-15  10-15   Time  -  10 Minutes  10 Minutes  10 Minutes  10 Minutes     Oxygen   Oxygen  Continuous  Continuous  Continuous  Continuous  Continuous   Liters  - 4-6  - 4-6  4-6  4-6  4-6     Recumbant Bike   Level  -  _2 Minutes  -  _3 NuStep   Level  _4 -  2   Minutes  _5 -  17   METs  2  1.4  -  -  1.4     Track   Laps  _6 Minutes  _7 Row Name 12/04/16 1600 12/16/16 1545 12/18/16 1527 12/25/16 1600 12/30/16 1526     Response to Exercise   Blood Pressure (Admit)  82/40 Rechecked BP 112/62  94/64  104/62  92/54  88/44   Blood Pressure (Exercise)  130/70  120/60  112/60  118/62  110/50   Blood Pressure (Exit)  100/60  104/70  108/58  90/60  96/60   Heart Rate (Admit)  57 bpm  60 bpm  63 bpm  73 bpm  65 bpm   Heart Rate (Exercise)  73 bpm  74 bpm  77 bpm  82 bpm  80 bpm   Heart Rate (Exit)  60 bpm  61 bpm  61 bpm  77 bpm  65 bpm   Oxygen Saturation (Admit)  93 %  92 %  92 %  97 %  91 %   Oxygen Saturation (Exercise)  95 %  95 %  90 %  92 %  91 %   Oxygen Saturation (Exit)  90 %  91 %  94 %  95 %  92 %   Rating of Perceived Exertion (Exercise)  _0 Perceived Dyspnea (Exercise)  _1 Duration  Progress to 45 minutes of aerobic exercise without signs/symptoms of physical  distress  Progress to 45 minutes of aerobic exercise without signs/symptoms of physical distress  Progress to 45 minutes of aerobic exercise without signs/symptoms of physical distress  Progress to 45 minutes of aerobic exercise without signs/symptoms of physical distress  Progress to 45 minutes of aerobic exercise without signs/symptoms of physical distress   Intensity  THRR unchanged  THRR unchanged  THRR unchanged  THRR unchanged  THRR unchanged     Progression   Progression  Continue to progress workloads to maintain intensity without signs/symptoms of physical distress.  Continue to progress workloads to maintain intensity without signs/symptoms of physical distress.  Continue to progress workloads to maintain intensity without signs/symptoms of physical distress.  Continue to progress workloads to maintain intensity without signs/symptoms of physical distress.  Continue to progress workloads to maintain intensity without signs/symptoms of physical distress.     Resistance Training   Training Prescription  Yes  Yes  Yes  Yes  Yes   Weight  orange bands  orange bands  orange bands  orange bands  orange bands   Reps  10-15  10-15  10-15  10-15  10-15   Time  10 Minutes  10 Minutes  10 Minutes  10 Minutes  10 Minutes     Oxygen   Oxygen  Continuous  Continuous  Continuous  Continuous  Continuous   Liters  4-6  4-6  4-6  4-6  4-6     Recumbant Bike   Level  -  4  4  -  2   Minutes  -  17  17  -  17     NuStep   Level  _2 Minutes  _3 METs  1.7  2  1.7  1.7  1.7     Track   Laps  8  -  _4 Minutes  17  -  _5 Home Exercise Plan   Plans to continue exercise at  -  -  Home (comment)  -  -   Frequency  -  -  Add 3 additional days to program exercise sessions.  -  -   Row Name 01/01/17 1533 01/06/17 1600 01/08/17 1500 01/13/17 1500 01/20/17 1500     Response to Exercise   Blood Pressure (Admit)  92/50  94/60  106/60  102/60  108/60    Blood Pressure (Exercise)  112/70  120/66  126/60  120/54  122/56   Blood Pressure (Exit)  96/60  95/64  112/72  100/62  96/56   Heart Rate (Admit)  59 bpm  62 bpm  58 bpm  59 bpm  67 bpm   Heart Rate (Exercise)  73 bpm  112 bpm  96 bpm  74 bpm  77 bpm   Heart Rate (Exit)  57 bpm  59 bpm  62 bpm  64 bpm  67 bpm   Oxygen Saturation (Admit)  93 %  94 %  89 %  90 %  88 %  Oxygen Saturation (Exercise)  91 %  87 % Sat at 87% on O2 3L.O2 increased to 6L sat improved to 91.  94 % Sat at 87% on O2 3L.O2 increased to 6L sat improved to 91.  93 %  89 %   Oxygen Saturation (Exit)  97 %  93 %  94 %  92 %  94 %   Rating of Perceived Exertion (Exercise)  _0 Perceived Dyspnea (Exercise)  1  -  _1 Symptoms  -  -  -  1  -   Duration  Progress to 45 minutes of aerobic exercise without signs/symptoms of physical distress  Progress to 45 minutes of aerobic exercise without signs/symptoms of physical distress  Progress to 45 minutes of aerobic exercise without signs/symptoms of physical distress  Progress to 45 minutes of aerobic exercise without signs/symptoms of physical distress  Progress to 45 minutes of aerobic exercise without signs/symptoms of physical distress   Intensity  THRR unchanged  THRR unchanged  THRR unchanged  THRR unchanged  THRR unchanged     Progression   Progression  Continue to progress workloads to maintain intensity without signs/symptoms of physical distress.  Continue to progress workloads to maintain intensity without signs/symptoms of physical distress.  Continue to progress workloads to maintain intensity without signs/symptoms of physical distress.  Continue to progress workloads to maintain intensity without signs/symptoms of physical distress.  Continue to progress workloads to maintain intensity without signs/symptoms of physical distress.     Resistance Training   Training Prescription  Yes  Yes  Yes  Yes  Yes   Weight  orange bands  orange bands  orange bands   orange bANDS  orange bANDS   Reps  10-15  10-15  10-15  10-15  10-15   Time  10 Minutes  10 Minutes  10 Minutes  10 Minutes  10 Minutes     Oxygen   Oxygen  Continuous  Continuous  Continuous  -  Continuous   Liters  4-6  4-6  4-6  -  6     Recumbant Bike   Level  -  _2 Minutes  -  _3 NuStep   Level  -  4  -  -  5   Minutes  -  17  -  -  17   METs  -  2  -  -  1.7     Track   Laps  _4 Minutes  _5 Row Name 01/22/17 1500 01/27/17 1500 02/03/17 1500 02/05/17 1619 02/10/17 1500     Response to Exercise   Blood Pressure (Admit)  92/54  108/64  116/58  108/60  87/58 recheck after gator aide to drink BP 96/58   Blood Pressure (Exercise)  116/64  122/70  124/70  100/64  106/50   Blood Pressure (Exit)  110/70  96/62 111/75 after drinking water  112/78  98/62  80/46   Heart Rate (Admit)  63 bpm  69 bpm  67 bpm  61 bpm  64 bpm   Heart Rate (Exercise)  77 bpm  83 bpm  91 bpm  78 bpm  85 bpm   Heart Rate (Exit)  65 bpm  90 bpm  61 bpm  69 bpm  71 bpm   Oxygen Saturation (Admit)  90 %  89 %  89 %  98 %  90 %   Oxygen Saturation (Exercise)  90 %  89 %  89 %  94 %  90 %   Oxygen Saturation (Exit)  93 %  96 %  97 %  97 %  92 %   Rating of Perceived Exertion (Exercise)  _0 Perceived Dyspnea (Exercise)  _1 Duration  Progress to 45 minutes of aerobic exercise without signs/symptoms of physical distress  Progress to 45 minutes of aerobic exercise without signs/symptoms of physical distress  Progress to 45 minutes of aerobic exercise without signs/symptoms of physical distress  Progress to 45 minutes of aerobic exercise without signs/symptoms of physical distress  Progress to 45 minutes of aerobic exercise without signs/symptoms of physical distress   Intensity  THRR unchanged  THRR unchanged  THRR unchanged  THRR unchanged  THRR unchanged     Progression   Progression  Continue to progress workloads to maintain  intensity without signs/symptoms of physical distress.  Continue to progress workloads to maintain intensity without signs/symptoms of physical distress.  Continue to progress workloads to maintain intensity without signs/symptoms of physical distress.  Continue to progress workloads to maintain intensity without signs/symptoms of physical distress.  Continue to progress workloads to maintain intensity without signs/symptoms of physical distress.     Resistance Training   Training Prescription  Yes  Yes  Yes  Yes  Yes   Weight  orange bands  orange bands  orange bands  orange bands  orange bands   Reps  10-15  10-15  10-15  10-15  10-15   Time  10 Minutes  10 Minutes  10 Minutes  10 Minutes  10 Minutes     Oxygen   Oxygen  -  Continuous  Continuous  Continuous  Continuous   Liters  -  _2 Recumbant Bike   Level  _3 -  3   Minutes  _4 -  17     NuStep   Level  _5 Minutes  _6 METs  1.6  1.9  1.7  1.8  1.6     Track   Laps  -  _7 Minutes  -  _8 Row Name 02/12/17 1500 02/17/17 1500 02/19/17 1500 03/03/17 1524 03/05/17 1500     Response to Exercise   Blood Pressure (Admit)  110/60  90/54  104/60  98/42  110/52   Blood Pressure (Exercise)  120/72  130/60  110/64  120/60  110/66   Blood Pressure (Exit)  100/64  100/56  100/60  100/54  100/60   Heart Rate (Admit)  61 bpm  61 bpm  76 bpm  69 bpm  65 bpm   Heart Rate (Exercise)  78 bpm  84 bpm  93 bpm  82 bpm  80 bpm   Heart Rate (Exit)  65 bpm  68 bpm  69 bpm  66 bpm  63 bpm  Oxygen Saturation (Admit)  94 %  92 %  93 %  90 %  90 %   Oxygen Saturation (Exercise)  89 %  90 %  91 %  87 %  88 %   Oxygen Saturation (Exit)  94 %  98 %  91 %  90 %  91 %   Rating of Perceived Exertion (Exercise)  _0 Perceived Dyspnea (Exercise)  _1 Duration  Progress to 45 minutes of aerobic exercise without signs/symptoms of physical distress   Progress to 45 minutes of aerobic exercise without signs/symptoms of physical distress  Progress to 45 minutes of aerobic exercise without signs/symptoms of physical distress  Progress to 45 minutes of aerobic exercise without signs/symptoms of physical distress  Progress to 45 minutes of aerobic exercise without signs/symptoms of physical distress   Intensity  THRR unchanged  THRR unchanged  THRR unchanged  THRR unchanged  THRR unchanged     Progression   Progression  Continue to progress workloads to maintain intensity without signs/symptoms of physical distress.  Continue to progress workloads to maintain intensity without signs/symptoms of physical distress.  Continue to progress workloads to maintain intensity without signs/symptoms of physical distress.  Continue to progress workloads to maintain intensity without signs/symptoms of physical distress.  Continue to progress workloads to maintain intensity without signs/symptoms of physical distress.     Resistance Training   Training Prescription  Yes  Yes  Yes  Yes  Yes   Weight  orange bands  orange bands  orange bands  orange bands  orange bands   Reps  10-15  10-15  10-15  10-15  10-15   Time  10 Minutes  10 Minutes  10 Minutes  10 Minutes  10 Minutes     Oxygen   Oxygen  Continuous  Continuous  Continuous  Continuous  Continuous   Liters  6  6  -  6-8  6-8     Recumbant Bike   Level  -  _2 -   Minutes  _3 -     NuStep   Level  _4 Minutes  _5 METs  1.8  1.6  1.5  1.7  1.8     Track   Laps  -  _6 Minutes  -  _7 Row Name 03/10/17 1519             Response to Exercise   Blood Pressure (Admit)  94/42       Blood Pressure (Exercise)  108/60       Blood Pressure (Exit)  118/70       Heart Rate (Admit)  77 bpm       Heart Rate (Exercise)  76 bpm       Heart Rate (Exit)  64 bpm       Oxygen Saturation (Admit)  88 %       Oxygen Saturation (Exercise)   88 %       Oxygen Saturation (Exit)  89 %       Rating of Perceived Exertion (Exercise)  15       Perceived  Dyspnea (Exercise)  3       Symptoms          Duration  Progress to 45 minutes of aerobic exercise without signs/symptoms of physical distress       Intensity  THRR unchanged         Progression   Progression  Continue to progress workloads to maintain intensity without signs/symptoms of physical distress.         Resistance Training   Training Prescription  Yes       Weight  orange bands       Reps  10-15       Time  10 Minutes         Oxygen   Oxygen  Continuous       Liters  6-8         Recumbant Bike   Level  4       Minutes  17         NuStep   Level  4       Minutes  17       METs  1.4         Track   Laps  4       Minutes  17          Exercise Comments: Exercise Comments    Row Name 12/18/16 1543           Exercise Comments  home exercise completed          Exercise Goals and Review: Exercise Goals    [place] Name 11/07/16 1447             Exercise Goals   Increase Physical Activity  Yes       Intervention  Provide advice, education, support and counseling about physical activity/exercise needs.;Develop an individualized exercise prescription for aerobic and resistive training based on initial evaluation findings, risk stratification, comorbidities and participant's personal goals.       Expected Outcomes  Achievement of increased cardiorespiratory fitness and enhanced flexibility, muscular endurance and strength shown through measurements of functional capacity and personal statement of participant.       Increase Strength and Stamina  Yes       Intervention  Provide advice, education, support and counseling about physical activity/exercise needs.;Develop an individualized exercise prescription for aerobic and resistive training based on initial evaluation findings, risk stratification, comorbidities and participant's personal goals.        Expected Outcomes  Achievement of increased cardiorespiratory fitness and enhanced flexibility, muscular endurance and strength shown through measurements of functional capacity and personal statement of participant.          Exercise Goals Re-Evaluation : Exercise Goals Re-Evaluation    Row Name 12/02/16 0944 12/22/16 1407 01/24/17 1349 02/16/17 1633 03/09/17 1607     Exercise Goal Re-Evaluation   Exercise Goals Review  Increase Strength and Stamina;Increase Physical Activity;Able to understand and use rate of perceived exertion (RPE) scale;Knowledge and understanding of Target Heart Rate Range (THRR);Understanding of Exercise Prescription;Able to understand and use Dyspnea scale  Increase Strength and Stamina;Able to understand and use Dyspnea scale;Increase Physical Activity;Able to understand and use rate of perceived exertion (RPE) scale;Knowledge and understanding of Target Heart Rate Range (THRR);Understanding of Exercise Prescription  Increase Strength and Stamina;Increase Physical Activity;Able to understand and use Dyspnea scale;Able to understand and use rate of perceived exertion (RPE) scale;Knowledge and understanding of Target Heart Rate Range (THRR);Understanding of Exercise Prescription  Increase Strength and Stamina;Increase  Physical Activity;Able to understand and use rate of perceived exertion (RPE) scale;Knowledge and understanding of Target Heart Rate Range (THRR);Understanding of Exercise Prescription;Able to understand and use Dyspnea scale  Increase Strength and Stamina;Increase Physical Activity;Able to understand and use rate of perceived exertion (RPE) scale;Knowledge and understanding of Target Heart Rate Range (THRR);Understanding of Exercise Prescription;Able to understand and use Dyspnea scale   Comments  Patient has only attended four exercise sessions. Will cont. to monitor and progress as able.   Patient is progressing slowly in program. She averages a MET level of  1.7-2.0. Will cont. to monitor and progress as able.   Patient is progressing slowly in program. She averages a MET level of 1.7-2.0. Will cont. to monitor and progress as able.   We have extended patient to 36 sessions. Patient is showing slow and gradual improvements. Walking up to 13 laps (234f each lap) in 15 minutes. States that she sees improvement. Attendance is consistent. Will cont. to follow.  We have extended patient to 36 sessions. Patient is showing slow and gradual improvements. Walking up to 13 laps (2066feach lap) in 15 minutes. States that she sees improvement. Attendance is consistent. Will cont. to follow.   Expected Outcomes  Through exercising at rehab and at home, patient will increase physical strength and stamina and find ADL's easier to perform.   Through exercising at rehab and at home, patient will increase physical strength and stamina and find ADL's easier to perform.   Through exercising at rehab and at home, patient will increase physical strength and stamina and find ADL's easier to perform.   Through exercise at rehab and at home, patient will increase strength and stamina and will find that ADL's are easier to preform.   Through exercise at rehab and at home, patient will increase strength and stamina making ADL's easier to perform. Patient will also have a better understanding of safe exercise and what they are capable to do outside of clinical supervision.      Discharge Exercise Prescription (Final Exercise Prescription Changes): Exercise Prescription Changes - 03/10/17 1519      Response to Exercise   Blood Pressure (Admit)  94/42    Blood Pressure (Exercise)  108/60    Blood Pressure (Exit)  118/70    Heart Rate (Admit)  77 bpm    Heart Rate (Exercise)  76 bpm    Heart Rate (Exit)  64 bpm    Oxygen Saturation (Admit)  88 %    Oxygen Saturation (Exercise)  88 %    Oxygen Saturation (Exit)  89 %    Rating of Perceived Exertion (Exercise)  15    Perceived  Dyspnea (Exercise)  3    Symptoms       Duration  Progress to 45 minutes of aerobic exercise without signs/symptoms of physical distress    Intensity  THRR unchanged      Progression   Progression  Continue to progress workloads to maintain intensity without signs/symptoms of physical distress.      Resistance Training   Training Prescription  Yes    Weight  orange bands    Reps  10-15    Time  10 Minutes      Oxygen   Oxygen  Continuous    Liters  6-8      Recumbant Bike   Level  4    Minutes  17      NuStep   Level  4    Minutes  17  METs  1.4      Track   Laps  4    Minutes  17       Nutrition:  Target Goals: Understanding of nutrition guidelines, daily intake of sodium <1571m, cholesterol <203m calories 30% from fat and 7% or less from saturated fats, daily to have 5 or more servings of fruits and vegetables.  Biometrics: Pre Biometrics - 11/07/16 1543      Pre Biometrics   Grip Strength  23 kg        Nutrition Therapy Plan and Nutrition Goals: Nutrition Therapy & Goals - 01/01/17 1541      Nutrition Therapy   Diet  Therapeutic Lifestyle Changes      Personal Nutrition Goals   Nutrition Goal  Identify food quantities necessary to achieve wt loss of  -2# per week to a goal wt loss of 2.7-10.9 kg (6-24 lb) at graduation from pulmonary rehab.      Intervention Plan   Intervention  Prescribe, educate and counsel regarding individualized specific dietary modifications aiming towards targeted core components such as weight, hypertension, lipid management, diabetes, heart failure and other comorbidities.;Nutrition handout(s) given to patient. Pre-diabetes handout given    Expected Outcomes  Short Term Goal: Understand basic principles of dietary content, such as calories, fat, sodium, cholesterol and nutrients.;Long Term Goal: Adherence to prescribed nutrition plan.       Nutrition Discharge: Rate Your Plate Scores: Nutrition Assessments - 12/22/16 1036       Rate Your Plate Scores   Pre Score  52       Nutrition Goals Re-Evaluation:   Nutrition Goals Discharge (Final Nutrition Goals Re-Evaluation):   Psychosocial: Target Goals: Acknowledge presence or absence of significant depression and/or stress, maximize coping skills, provide positive support system. Participant is able to verbalize types and ability to use techniques and skills needed for reducing stress and depression.  Initial Review & Psychosocial Screening: Initial Psych Review & Screening - 11/07/16 1505      Initial Review   Current issues with  None Identified      Family Dynamics   Good Support System?  Yes    Concerns  Recent loss of significant other    Comments  lost husband 2 years ago. only married for 2 years      Barriers   Psychosocial barriers to participate in program  There are no identifiable barriers or psychosocial needs.      Screening Interventions   Interventions  Encouraged to exercise       Quality of Life Scores:   PHQ-9: Recent Review Flowsheet Data    [facility] 11/07[facility]2016 10/17/2016 10/13/2016 10/06/2016   Decreased Interest _0 Down, Depressed, Hopeless 0 0 1 1   PHQ - 2 Score _1 Altered sleeping - - 1 1   Tired, decreased energy - - 1 1   Change in appetite - - 0 0   Feeling bad or failure about yourself  - - 0 -   Trouble concentrating - - 0 0   Moving slowly or fidgety/restless - - 0 0   Suicidal thoughts - - 0 0   PHQ-9 Score - - 4 4   Difficult doing work/chores - - Not difficult at all -     Interpretation of Total Score  Total Score Depression Severity:  1-4 = Minimal depression, 5-9 = Mild depression, 10-14 = Moderate depression, 15-19 = Moderately severe depression,  20-27 = Severe depression   Psychosocial Evaluation and Intervention: Psychosocial Evaluation - 11/07/16 1506      Psychosocial Evaluation & Interventions   Interventions  Encouraged to exercise with the program and follow  exercise prescription    Continue Psychosocial Services   Follow up required by staff       Psychosocial Re-Evaluation: Psychosocial Re-Evaluation    Scott Name 12/03/16 2111 12/26/16 1355 01/26/17 0814 02/17/17 1009 03/12/17 0916     Psychosocial Re-Evaluation   Current issues with  None Identified  None Identified  None Identified  None Identified  None Identified   Comments  lost husband 2 years ago and has lost contact with his children which saddens her. she was only married for 2 years.  lost husband 2 years ago and has lost contact with his children which saddens her. she was only married for 2 years.  lost husband 2 years ago and has lost contact with his children which saddens her. she was only married for 2 years.  lost husband 2 years ago and has lost contact with his children which saddens her. she was only married for 2 years.  lost husband 2 years ago and has lost contact with his children which saddens her. she was only married for 2 years.   Expected Outcomes  patient will remain free from psychosocial barriers to participation  patient will remain free from psychosocial barriers to participation  patient will remain free from psychosocial barriers to participation  patient will remain free from psychosocial barriers to participation  patient will remain free from psychosocial barriers to participation   Interventions  Encouraged to attend Pulmonary Rehabilitation for the exercise  Encouraged to attend Pulmonary Rehabilitation for the exercise  Encouraged to attend Pulmonary Rehabilitation for the exercise  Encouraged to attend Pulmonary Rehabilitation for the exercise  Encouraged to attend Pulmonary Rehabilitation for the exercise   Continue Psychosocial Services   Follow up required by staff  Follow up required by staff  Follow up required by staff  Follow up required by staff  Follow up required by staff      Psychosocial Discharge (Final Psychosocial Re-Evaluation): Psychosocial  Re-Evaluation - 03/12/17 0916      Psychosocial Re-Evaluation   Current issues with  None Identified    Comments  lost husband 2 years ago and has lost contact with his children which saddens her. she was only married for 2 years.    Expected Outcomes  patient will remain free from psychosocial barriers to participation    Interventions  Encouraged to attend Pulmonary Rehabilitation for the exercise    Continue Psychosocial Services   Follow up required by staff       Education: Education Goals: Education classes will be provided on a weekly basis, covering required topics. Participant will state understanding/return demonstration of topics presented.  Learning Barriers/Preferences: Learning Barriers/Preferences - 11/07/16 1501      Learning Barriers/Preferences   Learning Barriers  None    Learning Preferences  Group Instruction;Individual Instruction;Verbal Instruction;Written Material       Education Topics: Risk Factor Reduction:  -Group instruction that is supported by a PowerPoint presentation. Instructor discusses the definition of a risk factor, different risk factors for pulmonary disease, and how the heart and lungs work together.     Nutrition for Pulmonary Patient:  -Group instruction provided by PowerPoint slides, verbal discussion, and written materials to support subject matter. The instructor gives an explanation and review of healthy diet recommendations, which includes a  discussion on weight management, recommendations for fruit and vegetable consumption, as well as protein, fluid, caffeine, fiber, sodium, sugar, and alcohol. Tips for eating when patients are short of breath are discussed.   PULMONARY REHAB OTHER RESPIRATORY from 02/12/2017 in [place]  Date  11/27/16  Educator  edna  Instruction Review Code  2- meets goals/outcomes      Pursed Lip Breathing:  -Group instruction that is supported by demonstration and  informational handouts. Instructor discusses the benefits of pursed lip and diaphragmatic breathing and detailed demonstration on how to preform both.     Oxygen Safety:  -Group instruction provided by PowerPoint, verbal discussion, and written material to support subject matter. There is an overview of "What is Oxygen" and "Why do we need it".  Instructor also reviews how to create a safe environment for oxygen use, the importance of using oxygen as prescribed, and the risks of noncompliance. There is a brief discussion on traveling with oxygen and resources the patient may utilize.   PULMONARY REHAB OTHER RESPIRATORY from 02/12/2017 in [place]  Date  11/13/16  Educator  Truddie Crumble  Instruction Review Code  2- meets goals/outcomes      Oxygen Equipment:  -Group instruction provided by [employer] Staff utilizing handouts, written materials, and equipment demonstrations.   Signs and Symptoms:  -Group instruction provided by written material and verbal discussion to support subject matter. Warning signs and symptoms of infection, stroke, and heart attack are reviewed and when to call the physician/911 reinforced. Tips for preventing the spread of infection discussed.   PULMONARY REHAB OTHER RESPIRATORY from 02/12/2017 in [place]  Date  12/25/16  Educator  rn  Instruction Review Code  2- meets goals/outcomes      Advanced Directives:  -Group instruction provided by verbal instruction and written material to support subject matter. Instructor reviews Advanced Directive laws and proper instruction for filling out document.   Pulmonary Video:  -Group video education that reviews the importance of medication and oxygen compliance, exercise, good nutrition, pulmonary hygiene, and pursed lip and diaphragmatic breathing for the pulmonary patient.   PULMONARY REHAB OTHER RESPIRATORY from 02/12/2017 in [place]  Date  01/01/17  Instruction Review Code  2- meets goals/outcomes      Exercise for the Pulmonary Patient:  -Group instruction that is supported by a PowerPoint presentation. Instructor discusses benefits of exercise, core components of exercise, frequency, duration, and intensity of an exercise routine, importance of utilizing pulse oximetry during exercise, safety while exercising, and options of places to exercise outside of rehab.     PULMONARY REHAB OTHER RESPIRATORY from 02/12/2017 in [place]  Date  12/16/16  Educator  ep  Instruction Review Code  2- meets goals/outcomes      Pulmonary Medications:  -Verbally interactive group education provided by instructor with focus on inhaled medications and proper administration.   PULMONARY REHAB OTHER RESPIRATORY from 02/12/2017 in [place]  Date  02/05/17  Educator  pharmacist  Instruction Review Code  R- Review/reinforce      Anatomy and Physiology of the Respiratory System and Intimacy:  -Group instruction provided by PowerPoint, verbal discussion, and written material to support subject matter. Instructor reviews respiratory cycle and anatomical components of the respiratory system and their functions. Instructor also reviews differences in obstructive and restrictive respiratory diseases with examples of each. Intimacy, Sex,  and Sexuality differences are reviewed with a discussion on how relationships can change when diagnosed with pulmonary disease. Common sexual concerns are reviewed.   MD DAY -A group question and answer session with a medical doctor that allows participants to ask questions that relate to their pulmonary disease state.   PULMONARY REHAB OTHER RESPIRATORY from 02/12/2017 in [place]  Date  01/13/17  Educator  yacoub  Instruction Review Code  2- meets goals/outcomes      OTHER EDUCATION -Group or  individual verbal, written, or video instructions that support the educational goals of the pulmonary rehab program.   PULMONARY REHAB OTHER RESPIRATORY from 02/12/2017 in [place]  Date  02/12/17 [[facility]]  Educator  Parke Simmers  Instruction Review Code  1- Verbalizes Understanding      Knowledge Questionnaire Score:   Core Components/Risk Factors/Patient Goals at Admission: Personal Goals and Risk Factors at Admission - 11/07/16 1504      Core Components/Risk Factors/Patient Goals on Admission   Improve shortness of breath with ADL's  Yes    Intervention  Provide education, individualized exercise plan and daily activity instruction to help decrease symptoms of SOB with activities of daily living.    Expected Outcomes  Short Term: Achieves a reduction of symptoms when performing activities of daily living.    Develop more efficient breathing techniques such as purse lipped breathing and diaphragmatic breathing; and practicing self-pacing with activity  Yes    Intervention  Provide education, demonstration and support about specific breathing techniuqes utilized for more efficient breathing. Include techniques such as pursed lipped breathing, diaphragmatic breathing and self-pacing activity.    Expected Outcomes  Short Term: Participant will be able to demonstrate and use breathing techniques as needed throughout daily activities.    Increase knowledge of respiratory medications and ability to use respiratory devices properly   Yes    Intervention  Provide education and demonstration as needed of appropriate use of medications, inhalers, and oxygen therapy.    Expected Outcomes  Short Term: Achieves understanding of medications use. Understands that oxygen is a medication prescribed by physician. Demonstrates appropriate use of inhaler and oxygen therapy.       Core Components/Risk Factors/Patient Goals Review:  Goals and Risk Factor Review    Row Name  12/03/16 2109 12/26/16 1354 01/26/17 0813 02/17/17 1007 03/12/17 0915     Core Components/Risk Factors/Patient Goals Review   Personal Goals Review  Improve shortness of breath with ADL's;Increase knowledge of respiratory medications and ability to use respiratory devices properly.;Develop more efficient breathing techniques such as purse lipped breathing and diaphragmatic breathing and practicing self-pacing with activity.  Improve shortness of breath with ADL's;Increase knowledge of respiratory medications and ability to use respiratory devices properly.;Develop more efficient breathing techniques such as purse lipped breathing and diaphragmatic breathing and practicing self-pacing with activity.  Improve shortness of breath with ADL's;Increase knowledge of respiratory medications and ability to use respiratory devices properly.;Develop more efficient breathing techniques such as purse lipped breathing and diaphragmatic breathing and practicing self-pacing with activity.  Improve shortness of breath with ADL's;Increase knowledge of respiratory medications and ability to use respiratory devices properly.;Develop more efficient breathing techniques such as purse lipped breathing and diaphragmatic breathing and practicing self-pacing with activity.  Improve shortness of breath with ADL's;Increase knowledge of respiratory medications and ability to use respiratory devices properly.;Develop more efficient breathing techniques such as purse lipped breathing and diaphragmatic breathing and practicing self-pacing with activity.  Review  patient is beginning to use plb technique without prompting. she has not had any improvement in her shortness of breath at this point in rehab however she has missed several sessions.  patient is beginning to use plb technique without prompting. she has not had any improvement in her shortness of breath at this point in rehab however she has missed several sessions. will stress with  patient importance of consistant attendance in order to improve stamina and strength as she wishes  patients attendace has been much improved over the past 30 days. She is showing improvement in her SOB and utilizing PLB without cueing.  seeing slow improvement in strength and stamina, would benefit from extending to 36 sessions  Continues to slowly improve, walking continues to be difficult for [facility], still unable to walk independently from the parking lot   Expected Outcomes  see admission outcomes  see admission outcomes  see admission outcomes  see admission outcomes  see admission outcomes      Core Components/Risk Factors/Patient Goals at Discharge (Final Review):  Goals and Risk Factor Review - 03/12/17 0915      Core Components/Risk Factors/Patient Goals Review   Personal Goals Review  Improve shortness of breath with ADL's;Increase knowledge of respiratory medications and ability to use respiratory devices properly.;Develop more efficient breathing techniques such as purse lipped breathing and diaphragmatic breathing and practicing self-pacing with activity.    Review  Continues to slowly improve, walking continues to be difficult for [facility], still unable to walk independently from the parking lot    Expected Outcomes  see admission outcomes       ITP Comments:   Comments:ITP REVIEW Pt is making expected progress toward pulmonary rehab goals after completing 26 sessions. Recommend continued exercise, life style modification, education, and utilization of breathing techniques to increase stamina and strength and decrease shortness of breath with exertion.

## 2017-03-17 ENCOUNTER — Encounter (HOSPITAL_COMMUNITY): Payer: Medicare Other

## 2017-03-19 ENCOUNTER — Other Ambulatory Visit: Payer: Self-pay | Admitting: Cardiology

## 2017-03-19 ENCOUNTER — Encounter (HOSPITAL_COMMUNITY)
Admission: RE | Admit: 2017-03-19 | Discharge: 2017-03-19 | Disposition: A | Discharge status: Home / Self Care | Payer: Medicare Other | Source: Ambulatory Visit | Attending: Cardiology | Admitting: Cardiology

## 2017-03-19 VITALS — Wt 186.1 lb

## 2017-03-19 DIAGNOSIS — I272 Pulmonary hypertension, unspecified: Secondary | ICD-10-CM

## 2017-03-19 DIAGNOSIS — Z79899 Other long term (current) drug therapy: Secondary | ICD-10-CM | POA: Diagnosis not present

## 2017-03-19 DIAGNOSIS — I5032 Chronic diastolic (congestive) heart failure: Secondary | ICD-10-CM | POA: Diagnosis not present

## 2017-03-19 DIAGNOSIS — N183 Chronic kidney disease, stage 3 (moderate): Secondary | ICD-10-CM | POA: Diagnosis not present

## 2017-03-19 DIAGNOSIS — Z7901 Long term (current) use of anticoagulants: Secondary | ICD-10-CM | POA: Diagnosis not present

## 2017-03-19 DIAGNOSIS — I2510 Atherosclerotic heart disease of native coronary artery without angina pectoris: Secondary | ICD-10-CM | POA: Diagnosis not present

## 2017-03-19 NOTE — Progress Notes (Signed)
Daily Session Note  Patient Details  Name: Amy [name] MRN: [id number] Date of Birth: 1945/05/23 Referring Provider:     Pulmonary Rehab Walk Test from 11/11/2016 in [place]  Referring Provider  Dr. Aundra Dubin      Encounter Date: 03/19/2017  Check In: Session Check In - 03/19/17 1330      Check-In   Location  [facility]    Staff Present  Su Hilt, MS, ACSM RCEP, Exercise Physiologist;Portia Rollene Rotunda, RN, Maxcine Ham, RN, BSN    Supervising physician immediately available to respond to emergencies  [facility] immediately available    Physician(s)  Dr. Starla Link    Medication changes reported      No    Fall or balance concerns reported     No    Tobacco Cessation  No Change    Warm-up and Cool-down  Performed as group-led instruction    Resistance Training Performed  Yes    VAD Patient?  No      Pain Assessment   Currently in Pain?  No/denies    Multiple Pain Sites  No       Capillary Blood Glucose: No results found for this or any previous visit (from the past 24 hour(s)).  Exercise Prescription Changes - 03/19/17 1500      Response to Exercise   Blood Pressure (Admit)  106/46    Blood Pressure (Exercise)  120/54    Blood Pressure (Exit)  110/62    Heart Rate (Admit)  70 bpm    Heart Rate (Exercise)  86 bpm    Heart Rate (Exit)  63 bpm    Oxygen Saturation (Admit)  95 %    Oxygen Saturation (Exercise)  95 % 86 on track and 88-89 with seated exercise    Oxygen Saturation (Exit)  95 %    Rating of Perceived Exertion (Exercise)  12    Perceived Dyspnea (Exercise)  2    Duration  Progress to 45 minutes of aerobic exercise without signs/symptoms of physical distress    Intensity  THRR unchanged      Progression   Progression  Continue to progress workloads to maintain intensity without signs/symptoms of physical distress.      Resistance Training   Training Prescription  Yes    Weight  orange  bands    Reps  10-15    Time  10 Minutes      Oxygen   Oxygen  Continuous    Liters  6-8      Recumbant Bike   Level  4    Minutes  17      NuStep   Level  6    Minutes  17    METs  1.8       Social History   Tobacco Use  Smoking Status Former Smoker  . Packs/day: 0.50  . Years: 44.00  . Pack years: 22.00  . Types: Cigarettes  . Last attempt to quit: 04/07/2009  . Years since quitting: 7.9  Smokeless Tobacco Never Used    Goals Met:  Exercise tolerated well Strength training completed today  Goals Unmet:  Not Applicable  Comments: Service time is from 1330 to 1500    Dr. Rush Farmer is Medical Director for Pulmonary Rehab at [facility].

## 2017-03-24 ENCOUNTER — Encounter (HOSPITAL_COMMUNITY)
Admission: RE | Admit: 2017-03-24 | Discharge: 2017-03-24 | Disposition: A | Discharge status: Home / Self Care | Payer: Medicare Other | Source: Ambulatory Visit | Attending: Cardiology | Admitting: Cardiology

## 2017-03-24 VITALS — Wt 186.3 lb

## 2017-03-24 DIAGNOSIS — I2510 Atherosclerotic heart disease of native coronary artery without angina pectoris: Secondary | ICD-10-CM | POA: Diagnosis not present

## 2017-03-24 DIAGNOSIS — Z79899 Other long term (current) drug therapy: Secondary | ICD-10-CM | POA: Diagnosis not present

## 2017-03-24 DIAGNOSIS — Z7901 Long term (current) use of anticoagulants: Secondary | ICD-10-CM | POA: Diagnosis not present

## 2017-03-24 DIAGNOSIS — I5032 Chronic diastolic (congestive) heart failure: Secondary | ICD-10-CM | POA: Diagnosis not present

## 2017-03-24 DIAGNOSIS — N183 Chronic kidney disease, stage 3 (moderate): Secondary | ICD-10-CM | POA: Diagnosis not present

## 2017-03-24 DIAGNOSIS — I272 Pulmonary hypertension, unspecified: Secondary | ICD-10-CM

## 2017-03-24 NOTE — Progress Notes (Signed)
Daily Session Note  Patient Details  Name: Amy [name] MRN: [id number] Date of Birth: 01/22/1946 Referring Provider:     Pulmonary Rehab Walk Test from 11/11/2016 in [place]  Referring Provider  Dr. Aundra Dubin      Encounter Date: 03/24/2017  Check In: Session Check In - 03/24/17 1515      Check-In   Location  [facility]    Staff Present  Rosebud Poles, RN, BSN;Molly diVincenzo, MS, ACSM RCEP, Exercise Physiologist;Portia Rollene Rotunda, RN, Roque Cash, RN    Supervising physician immediately available to respond to emergencies  [facility] immediately available    Physician(s)  Dr. Lucianne Lei    Medication changes reported      No    Fall or balance concerns reported     No    Tobacco Cessation  No Change    Warm-up and Cool-down  Performed as group-led instruction    Resistance Training Performed  Yes    VAD Patient?  No      Pain Assessment   Currently in Pain?  No/denies    Multiple Pain Sites  No       Capillary Blood Glucose: No results found for this or any previous visit (from the past 24 hour(s)).  Exercise Prescription Changes - 03/24/17 1500      Response to Exercise   Blood Pressure (Admit)  98/64    Blood Pressure (Exercise)  96/68    Blood Pressure (Exit)  100/60    Heart Rate (Admit)  68 bpm    Heart Rate (Exercise)  73 bpm    Heart Rate (Exit)  60 bpm    Oxygen Saturation (Admit)  92 %    Oxygen Saturation (Exercise)  85 % O2 increased to 8L sat 88%    Oxygen Saturation (Exit)  92 %    Rating of Perceived Exertion (Exercise)  12    Perceived Dyspnea (Exercise)  2    Duration  Progress to 45 minutes of aerobic exercise without signs/symptoms of physical distress    Intensity  THRR unchanged      Progression   Progression  Continue to progress workloads to maintain intensity without signs/symptoms of physical distress.      Resistance Training   Training Prescription  Yes    Weight  orange bands     Reps  10-15    Time  10 Minutes      Oxygen   Oxygen  Continuous    Liters  6-8      Recumbant Bike   Level  4    Minutes  17      NuStep   Level  6    Minutes  17    METs  1.7      Track   Laps  5    Minutes  17       Social History   Tobacco Use  Smoking Status Former Smoker  . Packs/day: 0.50  . Years: 44.00  . Pack years: 22.00  . Types: Cigarettes  . Last attempt to quit: 04/07/2009  . Years since quitting: 7.9  Smokeless Tobacco Never Used    Goals Met:  Exercise tolerated well No report of cardiac concerns or symptoms Strength training completed today  Goals Unmet:  Not Applicable  Comments: Service time is from 1330 to 1515     Dr. Rush Farmer is Medical Director for Pulmonary Rehab at [facility].

## 2017-03-26 ENCOUNTER — Encounter (HOSPITAL_COMMUNITY)
Admission: RE | Admit: 2017-03-26 | Discharge: 2017-03-26 | Disposition: A | Discharge status: Home / Self Care | Payer: Medicare Other | Source: Ambulatory Visit

## 2017-03-26 ENCOUNTER — Encounter (HOSPITAL_COMMUNITY)
Admission: RE | Admit: 2017-03-26 | Discharge: 2017-03-26 | Disposition: A | Discharge status: Home / Self Care | Payer: Medicare Other | Source: Ambulatory Visit | Attending: Cardiology | Admitting: Cardiology

## 2017-03-26 VITALS — Wt 186.7 lb

## 2017-03-26 DIAGNOSIS — Z79899 Other long term (current) drug therapy: Secondary | ICD-10-CM | POA: Diagnosis not present

## 2017-03-26 DIAGNOSIS — N183 Chronic kidney disease, stage 3 (moderate): Secondary | ICD-10-CM | POA: Diagnosis not present

## 2017-03-26 DIAGNOSIS — I2510 Atherosclerotic heart disease of native coronary artery without angina pectoris: Secondary | ICD-10-CM | POA: Diagnosis not present

## 2017-03-26 DIAGNOSIS — Z7901 Long term (current) use of anticoagulants: Secondary | ICD-10-CM | POA: Diagnosis not present

## 2017-03-26 DIAGNOSIS — I272 Pulmonary hypertension, unspecified: Secondary | ICD-10-CM | POA: Diagnosis not present

## 2017-03-26 DIAGNOSIS — I5032 Chronic diastolic (congestive) heart failure: Secondary | ICD-10-CM | POA: Diagnosis not present

## 2017-03-26 NOTE — Progress Notes (Signed)
Daily Session Note  Patient Details  Name: Amy [name] MRN: [id number] Date of Birth: [birth year] Referring Provider:     Pulmonary Rehab Walk Test from 11/11/2016 in [place]  Referring Provider  Dr. Aundra Dubin      Encounter Date: 03/26/2017  Check In: Session Check In - 03/26/17 1409      Check-In   Location  [facility]    Staff Present  Rodney Langton, RN;Molly diVincenzo, MS, ACSM RCEP, Exercise Physiologist;Portia Rollene Rotunda, RN, BSN    Supervising physician immediately available to respond to emergencies  [facility] immediately available    Physician(s)  Dr. Wendee Beavers    Medication changes reported      No    Fall or balance concerns reported     No    Tobacco Cessation  No Change    Warm-up and Cool-down  Performed as group-led instruction    Resistance Training Performed  Yes    VAD Patient?  No      Pain Assessment   Currently in Pain?  No/denies    Multiple Pain Sites  No       Capillary Blood Glucose: No results found for this or any previous visit (from the past 24 hour(s)).  Exercise Prescription Changes - 03/26/17 1600      Response to Exercise   Blood Pressure (Admit)  92/64    Blood Pressure (Exercise)  100/64    Blood Pressure (Exit)  110/62    Heart Rate (Admit)  61 bpm    Heart Rate (Exercise)  82 bpm    Heart Rate (Exit)  69 bpm    Oxygen Saturation (Admit)  95 %    Oxygen Saturation (Exercise)  83 % O2 increased to 10L sat increased to 87 then 93    Oxygen Saturation (Exit)  94 %    Rating of Perceived Exertion (Exercise)  12    Perceived Dyspnea (Exercise)  2    Duration  Progress to 45 minutes of aerobic exercise without signs/symptoms of physical distress    Intensity  THRR unchanged      Progression   Progression  Continue to progress workloads to maintain intensity without signs/symptoms of physical distress.      Resistance Training   Training Prescription  Yes    Weight  oange bands     Reps  10-15    Time  10 Minutes      Oxygen   Oxygen  Continuous    Liters  6.8      Recumbant Bike   Level  4    Minutes  17      Track   Laps  9    Minutes  17       Social History   Tobacco Use  Smoking Status Former Smoker  . Packs/day: 0.50  . Years: 44.00  . Pack years: 22.00  . Types: Cigarettes  . Last attempt to quit: 04/07/2009  . Years since quitting: 7.9  Smokeless Tobacco Never Used    Goals Met:  Exercise tolerated well No report of cardiac concerns or symptoms Strength training completed today  Goals Unmet:  Not Applicable  Comments: Service time is from 1330 to 1540    Dr. Rush Farmer is Medical Director for Pulmonary Rehab at [facility].

## 2017-03-29 DIAGNOSIS — R091 Pleurisy: Secondary | ICD-10-CM | POA: Diagnosis not present

## 2017-03-29 DIAGNOSIS — M546 Pain in thoracic spine: Secondary | ICD-10-CM | POA: Diagnosis not present

## 2017-04-02 ENCOUNTER — Encounter (HOSPITAL_COMMUNITY)
Admission: RE | Admit: 2017-04-02 | Discharge: 2017-04-02 | Disposition: A | Discharge status: Home / Self Care | Payer: Medicare Other | Source: Ambulatory Visit

## 2017-04-02 DIAGNOSIS — I272 Pulmonary hypertension, unspecified: Secondary | ICD-10-CM

## 2017-04-02 NOTE — Progress Notes (Signed)
Pulmonary Individual Treatment Plan  Patient Details  Name: TAYTE CHILDERS MRN: [id number] Date of Birth: 04-18-1945 Referring Provider:     Pulmonary Rehab Walk Test from 11/11/2016 in [place] [facility]  Referring Provider  Dr. Aundra Dubin      Initial Encounter Date:    Pulmonary Rehab Walk Test from 11/11/2016 in [facility]  Date  11/11/16  Referring Provider  Dr. Aundra Dubin      Visit Diagnosis: Pulmonary hypertension ([place])  Patient's Home Medications on Admission:   Current Outpatient Medications:  .  acetaminophen (TYLENOL) 325 MG tablet, Take 2 tablets (650 mg total) by mouth every 4 (four) hours as needed for headache or mild pain., Disp: , Rfl:  .  atorvastatin (LIPITOR) 80 MG tablet, Take 1 tablet (80 mg total) by mouth daily with lunch., Disp: 90 tablet, Rfl: 3 .  Cholecalciferol (VITAMIN D) 2000 UNITS CAPS, Take 2,000 Units by mouth daily. , Disp: , Rfl:  .  colchicine 0.6 MG tablet, Take 0.6 mg by mouth daily. , Disp: , Rfl:  .  cyclobenzaprine (FLEXERIL) 5 MG tablet, Take 1 tablet (5 mg total) by mouth 3 (three) times daily as needed for muscle spasms., Disp: 10 tablet, Rfl: 0 .  ezetimibe (ZETIA) 10 MG tablet, TAKE 1 TABLET BY MOUTH DAILY, Disp: 90 tablet, Rfl: 1 .  furosemide (LASIX) 20 MG tablet, Take 1 tablet (20 mg total) by mouth daily., Disp: 30 tablet, Rfl: 6 .  Glycopyrrolate-Formoterol (BEVESPI AEROSPHERE) 9-4.8 MCG/ACT AERO, Inhale 1 puff into the lungs 2 (two) times daily., Disp: , Rfl:  .  levothyroxine (SYNTHROID, LEVOTHROID) 100 MCG tablet, Take 100 mcg by mouth daily before breakfast., Disp: , Rfl:  .  Macitentan (OPSUMIT) 10 MG TABS, Take 1 tablet (10 mg total) by mouth daily., Disp: 30 tablet, Rfl: 11 .  metoprolol tartrate (LOPRESSOR) 25 MG tablet, Take 0.5 tablets (12.5 mg total) by mouth daily as needed., Disp: 15 tablet, Rfl: 11 .  metoprolol tartrate (LOPRESSOR) 25 MG tablet, Take 0.5 tablets (12.5  mg total) by mouth daily as needed., Disp: 45 tablet, Rfl: 3 .  MULTAQ 400 MG tablet, TAKE 1 TABLET BY MOUTH TWICE A DAY WITH A MEAL, Disp: 180 tablet, Rfl: 3 .  Multiple Vitamin (MULTIVITAMIN) capsule, Take 1 capsule by mouth daily., Disp: , Rfl:  .  pantoprazole (PROTONIX) 40 MG tablet, TAKE 1 TABLET BY MOUTH DAILY, Disp: 90 tablet, Rfl: 2 .  potassium chloride SA (K-DUR,KLOR-CON) 20 MEQ tablet, Take 0.5 tablets (10 mEq total) by mouth at bedtime., Disp: , Rfl:  .  Rivaroxaban (XARELTO) 15 MG TABS tablet, Take 1 tablet (15 mg total) by mouth daily with supper., Disp: 30 tablet, Rfl: 11 .  Selexipag (UPTRAVI) 200 MCG TABS, Take 5 tablets (1,000 mcg total) 2 (two) times daily by mouth., Disp: 300 tablet, Rfl: 3 .  UNABLE TO FIND, Med Name: CPAP  DME-[facility], Disp: , Rfl:   Past Medical History: Past Medical History:  Diagnosis Date  . Apical variant hypertrophic cardiomyopathy ([place])    Diagnosed by echo and ECG 11/13  . Atypical atrial flutter ([place])   . CAD (coronary artery disease)    a. LHC in 10/2011 demonstrated non-obstructive disease and an 80% lesion in a small D1 treated medically.   . CHF (congestive heart failure) ([place])   . Chronic diastolic heart failure ([place])   . CKD (chronic kidney disease), stage III ([place])   . COPD (chronic obstructive  pulmonary disease) ([place])   . Dizziness    had PT for being off balance - in approx. 2012  . GERD (gastroesophageal reflux disease)   . Hyperlipidemia   . Hypertension   . Hypothyroid   . Lumbar disc disease   . Macular degeneration   . OSA (obstructive sleep apnea)   . Persistent atrial fibrillation ([place])       . Pulmonary hypertension ([place])    a. [place] 05/2013 showed mild PAH with normal PCWP and RA pressure, could be related to OSA and low oxygen saturation.   . Refusal of blood transfusions as patient is Jehovah's Witness   . Sinus bradycardia     Tobacco Use: Social History   Tobacco Use  Smoking Status Former Smoker  . Packs/day:  0.50  . Years: 44.00  . Pack years: 22.00  . Types: Cigarettes  . Last attempt to quit: 04/07/2009  . Years since quitting: 7.9  Smokeless Tobacco Never Used    Labs: Recent Review Flowsheet Data    Labs for ITP Cardiac and Pulmonary Rehab Latest Ref Rng & Units 09/27/2014 10/28/2014 06/25/2016 08/29/2016 09/12/2016   Cholestrol 100 - 199 mg/dL 137 - - 126 105   LDLCALC 0 - 99 mg/dL 72 - - 59 49   LDLDIRECT mg/dL - - - - -   HDL >39 mg/dL 49.50 - - 44 41   Trlycerides 0 - 149 mg/dL 76.0 - - 117 76   Hemoglobin A1c 4.8 - 5.6 % - 6.1(H) - - -   PHART 7.350 - 7.450 - - - - -   PCO2ART 35.0 - 45.0 mmHg - - - - -   HCO3 20.0 - 28.0 mmol/L - - 21.4 - -   TCO2 0 - 100 mmol/L - - 23 - -   ACIDBASEDEF 0.0 - 2.0 mmol/L - - 3.0(H) - -   O2SAT % - - 57.0 - -      Capillary Blood Glucose: No results found for: GLUCAP   Pulmonary Assessment Scores: Pulmonary Assessment Scores    Row Name 11/11/16 1646         ADL UCSD   ADL Phase  Entry       mMRC Score   mMRC Score  4        Pulmonary Function Assessment: Pulmonary Function Assessment - 11/07/16 1502      Breath   Bilateral Breath Sounds  Clear    Shortness of Breath  Yes;Limiting activity       Exercise Target Goals:    Exercise Program Goal: Individual exercise prescription set with THRR, safety & activity barriers. Participant demonstrates ability to understand and report RPE using BORG scale, to self-measure pulse accurately, and to acknowledge the importance of the exercise prescription.  Exercise Prescription Goal: Starting with aerobic activity 30 plus minutes a day, 3 days per week for initial exercise prescription. Provide home exercise prescription and guidelines that participant acknowledges understanding prior to discharge.  Activity Barriers & Risk Stratification:   6 Minute Walk: 6 Minute Walk    Row Name 11/11/16 1643         6 Minute Walk   Phase  Initial     Distance  730 feet     Walk Time  6  minutes     # of Rest Breaks  0     MPH  1.38     METS  2.07     RPE  13  Perceived Dyspnea   3     Symptoms  Yes (comment)     Comments  wheelchair     Resting HR  66 bpm     Resting BP  110/73     Max Ex. HR  89 bpm     Max Ex. BP  113/74       Interval HR   Baseline HR (retired)  66     1 Minute HR  72     2 Minute HR  90     3 Minute HR  89     4 Minute HR  87     5 Minute HR  89     6 Minute HR  86     2 Minute Post HR  79     Interval Heart Rate?  Yes       Interval Oxygen   Interval Oxygen?  Yes     Baseline Oxygen Saturation %  85 %     Resting Liters of Oxygen  3 L     1 Minute Oxygen Saturation %  90 %     1 Minute Liters of Oxygen  4 L     2 Minute Oxygen Saturation %  89 %     2 Minute Liters of Oxygen  4 L     3 Minute Oxygen Saturation %  88 %     3 Minute Liters of Oxygen  4 L     4 Minute Oxygen Saturation %  89 %     4 Minute Liters of Oxygen  4 L     5 Minute Oxygen Saturation %  88 %     5 Minute Liters of Oxygen  4 L     6 Minute Oxygen Saturation %  85 %     6 Minute Liters of Oxygen  4 L     2 Minute Post Oxygen Saturation %  92 %     2 Minute Post Liters of Oxygen  4 L        Oxygen Initial Assessment: Oxygen Initial Assessment - 11/11/16 1645      Initial 6 min Walk   Oxygen Used  Continuous;E-Tanks    Liters per minute  4    Resting Oxygen Saturation   94 %    Exercise Oxygen Saturation  during 6 min walk  85 %      Program Oxygen Prescription   Program Oxygen Prescription  Continuous;E-Tanks    Liters per minute  -- 4-6    Comments  needs depending on activity       Oxygen Re-Evaluation: Oxygen Re-Evaluation    Row Name 12/03/16 2107 12/26/16 1352 01/26/17 0812 02/17/17 1004 03/12/17 0913     Program Oxygen Prescription   Program Oxygen Prescription  Continuous;E-Tanks  Continuous;E-Tanks  Continuous;E-Tanks  Continuous;E-Tanks  Continuous;E-Tanks   Liters per minute  -  -  -  6  -   Comments  needs 4 to 6 liters  depending on activity  needs 4 to 6 liters depending on activity  needs 4 to 6 liters depending on activity  needs 4 to 6 liters depending on activity  needs 4 to 6 liters depending on activity     Home Oxygen   Home Oxygen Device  Home Concentrator;E-Tanks  Home Concentrator;E-Tanks  Home Concentrator;E-Tanks  Home Concentrator;E-Tanks  Home Concentrator;E-Tanks   Sleep Oxygen Prescription  CPAP  CPAP  CPAP  CPAP  CPAP  Liters per minute  _0 Home Exercise Oxygen Prescription  Continuous  Continuous  Continuous  Continuous  Continuous   Liters per minute  - 4 to 6  - 4-6  -  6  6   Home at Rest Exercise Oxygen Prescription  None  Continuous  Continuous  Continuous  Continuous   Liters per minute  _1 Compliance with Home Oxygen Use  Yes  Yes  Yes  Yes  Yes     Goals/Expected Outcomes   Short Term Goals  To learn and understand importance of monitoring SPO2 with pulse oximeter and demonstrate accurate use of the pulse oximeter.;To learn and understand importance of maintaining oxygen saturations>88%;To learn and exhibit compliance with exercise, home and travel O2 prescription;To learn and demonstrate proper pursed lip breathing techniques or other breathing techniques.;To learn and demonstrate proper use of respiratory medications  To learn and understand importance of monitoring SPO2 with pulse oximeter and demonstrate accurate use of the pulse oximeter.;To learn and understand importance of maintaining oxygen saturations>88%;To learn and exhibit compliance with exercise, home and travel O2 prescription;To learn and demonstrate proper pursed lip breathing techniques or other breathing techniques.;To learn and demonstrate proper use of respiratory medications  To learn and understand importance of monitoring SPO2 with pulse oximeter and demonstrate accurate use of the pulse oximeter.;To learn and understand importance of maintaining oxygen saturations>88%;To learn and exhibit  compliance with exercise, home and travel O2 prescription;To learn and demonstrate proper pursed lip breathing techniques or other breathing techniques.;To learn and demonstrate proper use of respiratory medications  To learn and understand importance of monitoring SPO2 with pulse oximeter and demonstrate accurate use of the pulse oximeter.;To learn and understand importance of maintaining oxygen saturations>88%;To learn and exhibit compliance with exercise, home and travel O2 prescription;To learn and demonstrate proper pursed lip breathing techniques or other breathing techniques.;To learn and demonstrate proper use of respiratory medications  To learn and exhibit compliance with exercise, home and travel O2 prescription   Long  Term Goals  Exhibits compliance with exercise, home and travel O2 prescription;Verbalizes importance of monitoring SPO2 with pulse oximeter and return demonstration;Maintenance of O2 saturations>88%;Exhibits proper breathing techniques, such as pursed lip breathing or other method taught during program session;Compliance with respiratory medication  Exhibits compliance with exercise, home and travel O2 prescription;Verbalizes importance of monitoring SPO2 with pulse oximeter and return demonstration;Maintenance of O2 saturations>88%;Exhibits proper breathing techniques, such as pursed lip breathing or other method taught during program session;Compliance with respiratory medication  Exhibits compliance with exercise, home and travel O2 prescription;Verbalizes importance of monitoring SPO2 with pulse oximeter and return demonstration;Maintenance of O2 saturations>88%;Exhibits proper breathing techniques, such as pursed lip breathing or other method taught during program session;Compliance with respiratory medication  Exhibits compliance with exercise, home and travel O2 prescription;Verbalizes importance of monitoring SPO2 with pulse oximeter and return demonstration;Maintenance of  O2 saturations>88%;Exhibits proper breathing techniques, such as pursed lip breathing or other method taught during program session;Compliance with respiratory medication  Exhibits compliance with exercise, home and travel O2 prescription   Comments  -  patient verbalizes compliance with home oxygen use  patient verbalizes compliance with home oxygen use  exhibits compliance with oxygen  exhibits compliance with oxygen   Goals/Expected Outcomes  -  see above  see above  continued compliance with oxygen useage  compliance with oxygen   Row Name 03/27/17 [phone]  Program Oxygen Prescription   Program Oxygen Prescription  Continuous;E-Tanks       Liters per minute  8       Comments  is now requiring 8 L to maintain saturations > 90% for her Pulmonary Hypertension         Home Oxygen   Home Oxygen Device  Home Concentrator;E-Tanks       Sleep Oxygen Prescription  CPAP       Liters per minute  3       Home Exercise Oxygen Prescription  Continuous       Liters per minute  8       Home at Rest Exercise Oxygen Prescription  Continuous       Liters per minute  3       Compliance with Home Oxygen Use  Yes         Goals/Expected Outcomes   Short Term Goals  To learn and exhibit compliance with exercise, home and travel O2 prescription;To learn and understand importance of monitoring SPO2 with pulse oximeter and demonstrate accurate use of the pulse oximeter.;To learn and understand importance of maintaining oxygen saturations>88%;To learn and demonstrate proper pursed lip breathing techniques or other breathing techniques.;To learn and demonstrate proper use of respiratory medications       Long  Term Goals  Exhibits compliance with exercise, home and travel O2 prescription       Comments  exhibits compliance with oxygen       Goals/Expected Outcomes  continued compliance          Oxygen Discharge (Final Oxygen Re-Evaluation): Oxygen Re-Evaluation - 03/27/17 0903      Program  Oxygen Prescription   Program Oxygen Prescription  Continuous;E-Tanks    Liters per minute  8    Comments  is now requiring 8 L to maintain saturations > 90% for her Pulmonary Hypertension      Home Oxygen   Home Oxygen Device  Home Concentrator;E-Tanks    Sleep Oxygen Prescription  CPAP    Liters per minute  3    Home Exercise Oxygen Prescription  Continuous    Liters per minute  8    Home at Rest Exercise Oxygen Prescription  Continuous    Liters per minute  3    Compliance with Home Oxygen Use  Yes      Goals/Expected Outcomes   Short Term Goals  To learn and exhibit compliance with exercise, home and travel O2 prescription;To learn and understand importance of monitoring SPO2 with pulse oximeter and demonstrate accurate use of the pulse oximeter.;To learn and understand importance of maintaining oxygen saturations>88%;To learn and demonstrate proper pursed lip breathing techniques or other breathing techniques.;To learn and demonstrate proper use of respiratory medications    Long  Term Goals  Exhibits compliance with exercise, home and travel O2 prescription    Comments  exhibits compliance with oxygen    Goals/Expected Outcomes  continued compliance       Initial Exercise Prescription: Initial Exercise Prescription - 11/11/16 1600      Date of Initial Exercise RX and Referring Provider   Date  11/11/16    Referring Provider  Dr. Aundra Dubin      Oxygen   Oxygen  Continuous    Liters  -- 4-6      Recumbant Bike   Level  1    Minutes  17      NuStep   Level  1    Minutes  17  METs  1.3      Track   Laps  4    Minutes  17      Prescription Details   Frequency (times per week)  2    Duration  Progress to 45 minutes of aerobic exercise without signs/symptoms of physical distress      Intensity   THRR 40-80% of Max Heartrate  60-119    Ratings of Perceived Exertion  11-13    Perceived Dyspnea  0-4      Progression   Progression  Continue to progress workloads  to maintain intensity without signs/symptoms of physical distress.      Resistance Training   Training Prescription  Yes    Weight  orange bands    Reps  10-15       Perform Capillary Blood Glucose checks as needed.  Exercise Prescription Changes: Exercise Prescription Changes    Row Name 11/13/16 1600 11/18/16 1500 11/20/16 1500 11/27/16 1500 12/02/16 1545     Response to Exercise   Blood Pressure (Admit)  98/54  110/70  130/68  96/54  102/58   Blood Pressure (Exercise)  106/60  132/80  118/56  128/70  100/62   Blood Pressure (Exit)  102/58  104/64  110/70  106/70  102/62   Heart Rate (Admit)  62 bpm  54 bpm  61 bpm  63 bpm  65 bpm   Heart Rate (Exercise)  73 bpm  72 bpm  76 bpm  77 bpm  73 bpm   Heart Rate (Exit)  63 bpm  54 bpm  63 bpm  60 bpm  64 bpm   Oxygen Saturation (Admit)  89 %  88 % increased O2 from 3L to 4L SaO2 inc to 90%   92 %  91 %  94 %   Oxygen Saturation (Exercise)  88 %  88 % inc O2 from 4L to 6L, SaO2 inc to 94%  87 % O2 increased to 6L sat up to 89  90 %  90 %   Oxygen Saturation (Exit)  93 %  98 % 6L O2  97 %  95 %  91 %   Rating of Perceived Exertion (Exercise)  _0 Perceived Dyspnea (Exercise)  _1 Duration  Progress to 45 minutes of aerobic exercise without signs/symptoms of physical distress  Progress to 45 minutes of aerobic exercise without signs/symptoms of physical distress  Progress to 45 minutes of aerobic exercise without signs/symptoms of physical distress  Progress to 45 minutes of aerobic exercise without signs/symptoms of physical distress  Progress to 45 minutes of aerobic exercise without signs/symptoms of physical distress   Intensity  THRR unchanged  THRR unchanged  THRR unchanged  THRR unchanged  THRR unchanged     Progression   Progression  Continue to progress workloads to maintain intensity without signs/symptoms of physical distress.  Continue to progress workloads to maintain intensity without signs/symptoms  of physical distress.  Continue to progress workloads to maintain intensity without signs/symptoms of physical distress.  Continue to progress workloads to maintain intensity without signs/symptoms of physical distress.  Continue to progress workloads to maintain intensity without signs/symptoms of physical distress.     Resistance Training   Training Prescription  Yes  Yes  Yes  Yes  Yes   Weight  orange bands  orange bands  orange bands  orange bands  orange bands  Reps  10-15  10-15  10-15  10-15  10-15   Time  -  10 Minutes  10 Minutes  10 Minutes  10 Minutes     Oxygen   Oxygen  Continuous  Continuous  Continuous  Continuous  Continuous   Liters  - 4-6  - 4-6  4-6  4-6  4-6     Recumbant Bike   Level  -  _0 Minutes  -  _1 NuStep   Level  _2 -  2   Minutes  _3 -  17   METs  2  1.4  -  -  1.4     Track   Laps  _4 Minutes  _5 Row Name 12/04/16 1600 12/16/16 1545 12/18/16 1527 12/25/16 1600 12/30/16 1526     Response to Exercise   Blood Pressure (Admit)  82/40 Rechecked BP 112/62  94/64  104/62  92/54  88/44   Blood Pressure (Exercise)  130/70  120/60  112/60  118/62  110/50   Blood Pressure (Exit)  100/60  104/70  108/58  90/60  96/60   Heart Rate (Admit)  57 bpm  60 bpm  63 bpm  73 bpm  65 bpm   Heart Rate (Exercise)  73 bpm  74 bpm  77 bpm  82 bpm  80 bpm   Heart Rate (Exit)  60 bpm  61 bpm  61 bpm  77 bpm  65 bpm   Oxygen Saturation (Admit)  93 %  92 %  92 %  97 %  91 %   Oxygen Saturation (Exercise)  95 %  95 %  90 %  92 %  91 %   Oxygen Saturation (Exit)  90 %  91 %  94 %  95 %  92 %   Rating of Perceived Exertion (Exercise)  _6 Perceived Dyspnea (Exercise)  _7 Duration  Progress to 45 minutes of aerobic exercise without signs/symptoms of physical distress  Progress to 45 minutes of aerobic exercise without signs/symptoms of physical distress  Progress to 45 minutes of  aerobic exercise without signs/symptoms of physical distress  Progress to 45 minutes of aerobic exercise without signs/symptoms of physical distress  Progress to 45 minutes of aerobic exercise without signs/symptoms of physical distress   Intensity  THRR unchanged  THRR unchanged  THRR unchanged  THRR unchanged  THRR unchanged     Progression   Progression  Continue to progress workloads to maintain intensity without signs/symptoms of physical distress.  Continue to progress workloads to maintain intensity without signs/symptoms of physical distress.  Continue to progress workloads to maintain intensity without signs/symptoms of physical distress.  Continue to progress workloads to maintain intensity without signs/symptoms of physical distress.  Continue to progress workloads to maintain intensity without signs/symptoms of physical distress.     Resistance Training   Training Prescription  Yes  Yes  Yes  Yes  Yes   Weight  orange bands  orange bands  orange bands  orange bands  orange bands   Reps  10-15  10-15  10-15  10-15  10-15  Time  10 Minutes  10 Minutes  10 Minutes  10 Minutes  10 Minutes     Oxygen   Oxygen  Continuous  Continuous  Continuous  Continuous  Continuous   Liters  4-6  4-6  4-6  4-6  4-6     Recumbant Bike   Level  -  4  4  -  2   Minutes  -  17  17  -  17     NuStep   Level  _0 Minutes  _1 METs  1.7  2  1.7  1.7  1.7     Track   Laps  8  -  _2 Minutes  17  -  _3 Home Exercise Plan   Plans to continue exercise at  -  -  Home (comment)  -  -   Frequency  -  -  Add 3 additional days to program exercise sessions.  -  -   Row Name 01/01/17 1533 01/06/17 1600 01/08/17 1500 01/13/17 1500 01/20/17 1500     Response to Exercise   Blood Pressure (Admit)  92/50  94/60  106/60  102/60  108/60   Blood Pressure (Exercise)  112/70  120/66  126/60  120/54  122/56   Blood Pressure (Exit)  96/60  95/64  112/72  100/62  96/56    Heart Rate (Admit)  59 bpm  62 bpm  58 bpm  59 bpm  67 bpm   Heart Rate (Exercise)  73 bpm  112 bpm  96 bpm  74 bpm  77 bpm   Heart Rate (Exit)  57 bpm  59 bpm  62 bpm  64 bpm  67 bpm   Oxygen Saturation (Admit)  93 %  94 %  89 %  90 %  88 %   Oxygen Saturation (Exercise)  91 %  87 % Sat at 87% on O2 3L.O2 increased to 6L sat improved to 91.  94 % Sat at 87% on O2 3L.O2 increased to 6L sat improved to 91.  93 %  89 %   Oxygen Saturation (Exit)  97 %  93 %  94 %  92 %  94 %   Rating of Perceived Exertion (Exercise)  _4 Perceived Dyspnea (Exercise)  1  -  _5 Symptoms  -  -  -  1  -   Duration  Progress to 45 minutes of aerobic exercise without signs/symptoms of physical distress  Progress to 45 minutes of aerobic exercise without signs/symptoms of physical distress  Progress to 45 minutes of aerobic exercise without signs/symptoms of physical distress  Progress to 45 minutes of aerobic exercise without signs/symptoms of physical distress  Progress to 45 minutes of aerobic exercise without signs/symptoms of physical distress   Intensity  THRR unchanged  THRR unchanged  THRR unchanged  THRR unchanged  THRR unchanged     Progression   Progression  Continue to progress workloads to maintain intensity without signs/symptoms of physical distress.  Continue to progress workloads to maintain intensity without signs/symptoms of physical distress.  Continue to progress workloads to maintain intensity without signs/symptoms of physical distress.  Continue to progress workloads to maintain intensity without signs/symptoms  of physical distress.  Continue to progress workloads to maintain intensity without signs/symptoms of physical distress.     Resistance Training   Training Prescription  Yes  Yes  Yes  Yes  Yes   Weight  orange bands  orange bands  orange bands  orange bANDS  orange bANDS   Reps  10-15  10-15  10-15  10-15  10-15   Time  10 Minutes  10 Minutes  10 Minutes  10 Minutes   10 Minutes     Oxygen   Oxygen  Continuous  Continuous  Continuous  -  Continuous   Liters  4-6  4-6  4-6  -  6     Recumbant Bike   Level  -  _0 Minutes  -  _1 NuStep   Level  -  4  -  -  5   Minutes  -  17  -  -  17   METs  -  2  -  -  1.7     Track   Laps  _2 Minutes  _3 Row Name 01/22/17 1500 01/27/17 1500 02/03/17 1500 02/05/17 1619 02/10/17 1500     Response to Exercise   Blood Pressure (Admit)  92/54  108/64  116/58  108/60  87/58 recheck after gator aide to drink BP 96/58   Blood Pressure (Exercise)  116/64  122/70  124/70  100/64  106/50   Blood Pressure (Exit)  110/70  96/62 111/75 after drinking water  112/78  98/62  80/46   Heart Rate (Admit)  63 bpm  69 bpm  67 bpm  61 bpm  64 bpm   Heart Rate (Exercise)  77 bpm  83 bpm  91 bpm  78 bpm  85 bpm   Heart Rate (Exit)  65 bpm  90 bpm  61 bpm  69 bpm  71 bpm   Oxygen Saturation (Admit)  90 %  89 %  89 %  98 %  90 %   Oxygen Saturation (Exercise)  90 %  89 %  89 %  94 %  90 %   Oxygen Saturation (Exit)  93 %  96 %  97 %  97 %  92 %   Rating of Perceived Exertion (Exercise)  _4 Perceived Dyspnea (Exercise)  _5 Duration  Progress to 45 minutes of aerobic exercise without signs/symptoms of physical distress  Progress to 45 minutes of aerobic exercise without signs/symptoms of physical distress  Progress to 45 minutes of aerobic exercise without signs/symptoms of physical distress  Progress to 45 minutes of aerobic exercise without signs/symptoms of physical distress  Progress to 45 minutes of aerobic exercise without signs/symptoms of physical distress   Intensity  THRR unchanged  THRR unchanged  THRR unchanged  THRR unchanged  THRR unchanged     Progression   Progression  Continue to progress workloads to maintain intensity without signs/symptoms of physical distress.  Continue to progress workloads to maintain intensity without  signs/symptoms of physical distress.  Continue to progress workloads to maintain intensity without signs/symptoms of physical distress.  Continue to progress workloads to maintain intensity without signs/symptoms of physical  distress.  Continue to progress workloads to maintain intensity without signs/symptoms of physical distress.     Resistance Training   Training Prescription  Yes  Yes  Yes  Yes  Yes   Weight  orange bands  orange bands  orange bands  orange bands  orange bands   Reps  10-15  10-15  10-15  10-15  10-15   Time  10 Minutes  10 Minutes  10 Minutes  10 Minutes  10 Minutes     Oxygen   Oxygen  -  Continuous  Continuous  Continuous  Continuous   Liters  -  _0 Recumbant Bike   Level  _1 -  3   Minutes  _2 -  17     NuStep   Level  _3 Minutes  _4 METs  1.6  1.9  1.7  1.8  1.6     Track   Laps  -  _5 Minutes  -  _6 Row Name 02/12/17 1500 02/17/17 1500 02/19/17 1500 03/03/17 1524 03/05/17 1500     Response to Exercise   Blood Pressure (Admit)  110/60  90/54  104/60  98/42  110/52   Blood Pressure (Exercise)  120/72  130/60  110/64  120/60  110/66   Blood Pressure (Exit)  100/64  100/56  100/60  100/54  100/60   Heart Rate (Admit)  61 bpm  61 bpm  76 bpm  69 bpm  65 bpm   Heart Rate (Exercise)  78 bpm  84 bpm  93 bpm  82 bpm  80 bpm   Heart Rate (Exit)  65 bpm  68 bpm  69 bpm  66 bpm  63 bpm   Oxygen Saturation (Admit)  94 %  92 %  93 %  90 %  90 %   Oxygen Saturation (Exercise)  89 %  90 %  91 %  87 %  88 %   Oxygen Saturation (Exit)  94 %  98 %  91 %  90 %  91 %   Rating of Perceived Exertion (Exercise)  _7 Perceived Dyspnea (Exercise)  _8 Duration  Progress to 45 minutes of aerobic exercise without signs/symptoms of physical distress  Progress to 45 minutes of aerobic exercise without signs/symptoms of physical distress  Progress to 45 minutes of  aerobic exercise without signs/symptoms of physical distress  Progress to 45 minutes of aerobic exercise without signs/symptoms of physical distress  Progress to 45 minutes of aerobic exercise without signs/symptoms of physical distress   Intensity  THRR unchanged  THRR unchanged  THRR unchanged  THRR unchanged  THRR unchanged     Progression   Progression  Continue to progress workloads to maintain intensity without signs/symptoms of physical distress.  Continue to progress workloads to maintain intensity without signs/symptoms of physical distress.  Continue to progress workloads to maintain intensity without signs/symptoms of physical distress.  Continue to progress workloads to maintain intensity without signs/symptoms of physical distress.  Continue to progress workloads to maintain intensity without signs/symptoms of physical distress.  Resistance Training   Training Prescription  Yes  Yes  Yes  Yes  Yes   Weight  orange bands  orange bands  orange bands  orange bands  orange bands   Reps  10-15  10-15  10-15  10-15  10-15   Time  10 Minutes  10 Minutes  10 Minutes  10 Minutes  10 Minutes     Oxygen   Oxygen  Continuous  Continuous  Continuous  Continuous  Continuous   Liters  6  6  -  6-8  6-8     Recumbant Bike   Level  -  _0 -   Minutes  _1 -     NuStep   Level  _2 Minutes  _3 METs  1.8  1.6  1.5  1.7  1.8     Track   Laps  -  _4 Minutes  -  _5 Row Name 03/10/17 1519 03/19/17 1500 03/24/17 1500 03/26/17 1600       Response to Exercise   Blood Pressure (Admit)  94/42  106/46  98/64  92/64    Blood Pressure (Exercise)  108/60  120/54  96/68  100/64    Blood Pressure (Exit)  118/70  110/62  100/60  110/62    Heart Rate (Admit)  77 bpm  70 bpm  68 bpm  61 bpm    Heart Rate (Exercise)  76 bpm  86 bpm  73 bpm  82 bpm    Heart Rate (Exit)  64 bpm  63 bpm  60 bpm  69 bpm    Oxygen Saturation (Admit)   88 %  95 %  92 %  95 %    Oxygen Saturation (Exercise)  88 %  95 % 86 on track and 88-89 with seated exercise  85 % O2 increased to 8L sat 88%  83 % O2 increased to 10L sat increased to 87 then 93    Oxygen Saturation (Exit)  89 %  95 %  92 %  94 %    Rating of Perceived Exertion (Exercise)  _6 Perceived Dyspnea (Exercise)  _7 Symptoms     -  -  -    Duration  Progress to 45 minutes of aerobic exercise without signs/symptoms of physical distress  Progress to 45 minutes of aerobic exercise without signs/symptoms of physical distress  Progress to 45 minutes of aerobic exercise without signs/symptoms of physical distress  Progress to 45 minutes of aerobic exercise without signs/symptoms of physical distress    Intensity  THRR unchanged  THRR unchanged  THRR unchanged  THRR unchanged      Progression   Progression  Continue to progress workloads to maintain intensity without signs/symptoms of physical distress.  Continue to progress workloads to maintain intensity without signs/symptoms of physical distress.  Continue to progress workloads to maintain intensity without signs/symptoms of physical distress.  Continue to progress workloads to maintain intensity without signs/symptoms of physical distress.      Resistance Training   Training Prescription  Yes  Yes  Yes  Yes    Weight  orange bands  orange bands  orange bands  oange bands    Reps  10-15  10-15  10-15  10-15    Time  10 Minutes  10 Minutes  10 Minutes  10 Minutes      Oxygen   Oxygen  Continuous  Continuous  Continuous  Continuous    Liters  6-8  6-8  6-8  6.8      Recumbant Bike   Level  _0 Minutes  _1 NuStep   Level  _2 -    Minutes  _3 -    METs  1.4  1.8  1.7  -      Track   Laps  4  -  5  9    Minutes  17  -  17  17       Exercise Comments: Exercise Comments    Row Name 12/18/16 1543           Exercise Comments  home exercise completed           Exercise Goals and Review: Exercise Goals    Row Name 11/07/16 1447             Exercise Goals   Increase Physical Activity  Yes       Intervention  Provide advice, education, support and counseling about physical activity/exercise needs.;Develop an individualized exercise prescription for aerobic and resistive training based on initial evaluation findings, risk stratification, comorbidities and participant's personal goals.       Expected Outcomes  Achievement of increased cardiorespiratory fitness and enhanced flexibility, muscular endurance and strength shown through measurements of functional capacity and personal statement of participant.       Increase Strength and Stamina  Yes       Intervention  Provide advice, education, support and counseling about physical activity/exercise needs.;Develop an individualized exercise prescription for aerobic and resistive training based on initial evaluation findings, risk stratification, comorbidities and participant's personal goals.       Expected Outcomes  Achievement of increased cardiorespiratory fitness and enhanced flexibility, muscular endurance and strength shown through measurements of functional capacity and personal statement of participant.          Exercise Goals Re-Evaluation : Exercise Goals Re-Evaluation    Row Name 12/02/16 0944 12/22/16 1407 01/24/17 1349 02/16/17 1633 03/09/17 1607     Exercise Goal Re-Evaluation   Exercise Goals Review  Increase Strength and Stamina;Increase Physical Activity;Able to understand and use rate of perceived exertion (RPE) scale;Knowledge and understanding of Target Heart Rate Range (THRR);Understanding of Exercise Prescription;Able to understand and use Dyspnea scale  Increase Strength and Stamina;Able to understand and use Dyspnea scale;Increase Physical Activity;Able to understand and use rate of perceived exertion (RPE) scale;Knowledge and understanding of Target Heart Rate Range  (THRR);Understanding of Exercise Prescription  Increase Strength and Stamina;Increase Physical Activity;Able to understand and use Dyspnea scale;Able to understand and use rate of perceived exertion (RPE) scale;Knowledge and understanding of Target Heart Rate Range (THRR);Understanding of Exercise Prescription  Increase Strength and Stamina;Increase Physical Activity;Able to understand and use rate of perceived exertion (RPE) scale;Knowledge and understanding of Target Heart Rate Range (THRR);Understanding of Exercise Prescription;Able to understand and use Dyspnea scale  Increase Strength and Stamina;Increase Physical Activity;Able to understand and use rate of perceived exertion (RPE) scale;Knowledge and understanding of Target Heart Rate Range (  THRR);Understanding of Exercise Prescription;Able to understand and use Dyspnea scale   Comments  Patient has only attended four exercise sessions. Will cont. to monitor and progress as able.   Patient is progressing slowly in program. She averages a MET level of 1.7-2.0. Will cont. to monitor and progress as able.   Patient is progressing slowly in program. She averages a MET level of 1.7-2.0. Will cont. to monitor and progress as able.   We have extended patient to 36 sessions. Patient is showing slow and gradual improvements. Walking up to 13 laps (228f each lap) in 15 minutes. States that she sees improvement. Attendance is consistent. Will cont. to follow.  We have extended patient to 36 sessions. Patient is showing slow and gradual improvements. Walking up to 13 laps (2027feach lap) in 15 minutes. States that she sees improvement. Attendance is consistent. Will cont. to follow.   Expected Outcomes  Through exercising at rehab and at home, patient will increase physical strength and stamina and find ADL's easier to perform.   Through exercising at rehab and at home, patient will increase physical strength and stamina and find ADL's easier to perform.   Through  exercising at rehab and at home, patient will increase physical strength and stamina and find ADL's easier to perform.   Through exercise at rehab and at home, patient will increase strength and stamina and will find that ADL's are easier to preform.   Through exercise at rehab and at home, patient will increase strength and stamina making ADL's easier to perform. Patient will also have a better understanding of safe exercise and what they are capable to do outside of clinical supervision.   RoSeligmaname 03/26/17 1002             Exercise Goal Re-Evaluation   Exercise Goals Review  Increase Strength and Stamina;Able to understand and use Dyspnea scale;Increase Physical Activity;Able to understand and use rate of perceived exertion (RPE) scale;Knowledge and understanding of Target Heart Rate Range (THRR);Understanding of Exercise Prescription       Comments  We have extended patient to 36 sessions. Patient is showing slow and gradual improvements. Is not exercising at home. Will discuss and encourage. Walking up to 10 laps (20015fach lap) in 15 minutes. States that she sees improvement. Attendance is consistent. Will cont. to follow.       Expected Outcomes  Through exercise at rehab and at home, patient will increase strength and stamina making ADL's easier to perform. Patient will also have a better understanding of safe exercise and what they are capable to do outside of clinical supervision.          Discharge Exercise Prescription (Final Exercise Prescription Changes): Exercise Prescription Changes - 03/26/17 1600      Response to Exercise   Blood Pressure (Admit)  92/64    Blood Pressure (Exercise)  100/64    Blood Pressure (Exit)  110/62    Heart Rate (Admit)  61 bpm    Heart Rate (Exercise)  82 bpm    Heart Rate (Exit)  69 bpm    Oxygen Saturation (Admit)  95 %    Oxygen Saturation (Exercise)  83 % O2 increased to 10L sat increased to 87 then 93    Oxygen Saturation (Exit)  94 %     Rating of Perceived Exertion (Exercise)  12    Perceived Dyspnea (Exercise)  2    Duration  Progress to 45 minutes of aerobic exercise without signs/symptoms of  physical distress    Intensity  THRR unchanged      Progression   Progression  Continue to progress workloads to maintain intensity without signs/symptoms of physical distress.      Resistance Training   Training Prescription  Yes    Weight  oange bands    Reps  10-15    Time  10 Minutes      Oxygen   Oxygen  Continuous    Liters  6.8      Recumbant Bike   Level  4    Minutes  17      Track   Laps  9    Minutes  17       Nutrition:  Target Goals: Understanding of nutrition guidelines, daily intake of sodium <1560m, cholesterol <2018m calories 30% from fat and 7% or less from saturated fats, daily to have 5 or more servings of fruits and vegetables.  Biometrics: Pre Biometrics - 11/07/16 1543      Pre Biometrics   Grip Strength  23 kg        Nutrition Therapy Plan and Nutrition Goals: Nutrition Therapy & Goals - 01/01/17 1541      Nutrition Therapy   Diet  Therapeutic Lifestyle Changes      Personal Nutrition Goals   Nutrition Goal  Identify food quantities necessary to achieve wt loss of  -2# per week to a goal wt loss of 2.7-10.9 kg (6-24 lb) at graduation from pulmonary rehab.      Intervention Plan   Intervention  Prescribe, educate and counsel regarding individualized specific dietary modifications aiming towards targeted core components such as weight, hypertension, lipid management, diabetes, heart failure and other comorbidities.;Nutrition handout(s) given to patient. Pre-diabetes handout given    Expected Outcomes  Short Term Goal: Understand basic principles of dietary content, such as calories, fat, sodium, cholesterol and nutrients.;Long Term Goal: Adherence to prescribed nutrition plan.       Nutrition Discharge: Rate Your Plate Scores: Nutrition Assessments - 12/22/16 1036      Rate  Your Plate Scores   Pre Score  52       Nutrition Goals Re-Evaluation:   Nutrition Goals Discharge (Final Nutrition Goals Re-Evaluation):   Psychosocial: Target Goals: Acknowledge presence or absence of significant depression and/or stress, maximize coping skills, provide positive support system. Participant is able to verbalize types and ability to use techniques and skills needed for reducing stress and depression.  Initial Review & Psychosocial Screening: Initial Psych Review & Screening - 11/07/16 1505      Initial Review   Current issues with  None Identified      Family Dynamics   Good Support System?  Yes    Concerns  Recent loss of significant other    Comments  lost husband 2 years ago. only married for 2 years      Barriers   Psychosocial barriers to participate in program  There are no identifiable barriers or psychosocial needs.      Screening Interventions   Interventions  Encouraged to exercise       Quality of Life Scores:   PHQ-9: Recent Review Flowsheet Data    [facility]2016 10/17/2016 10/13/2016 10/06/2016   Decreased Interest _0 Down, Depressed, Hopeless 0 0 1 1   PHQ - 2 Score _1 Altered sleeping - - 1 1   Tired, decreased energy - - 1 1   Change  in appetite - - 0 0   Feeling bad or failure about yourself  - - 0 -   Trouble concentrating - - 0 0   Moving slowly or fidgety/restless - - 0 0   Suicidal thoughts - - 0 0   PHQ-9 Score - - 4 4   Difficult doing work/chores - - Not difficult at all -     Interpretation of Total Score  Total Score Depression Severity:  1-4 = Minimal depression, 5-9 = Mild depression, 10-14 = Moderate depression, 15-19 = Moderately severe depression, 20-27 = Severe depression   Psychosocial Evaluation and Intervention: Psychosocial Evaluation - 11/07/16 1506      Psychosocial Evaluation & Interventions   Interventions  Encouraged to exercise with the program and follow exercise  prescription    Continue Psychosocial Services   Follow up required by staff       Psychosocial Re-Evaluation: Psychosocial Re-Evaluation    [place] Name 12/03/16 2111 12/26/16 1355 01/26/17 0814 02/17/17 1009 03/12/17 0916     Psychosocial Re-Evaluation   Current issues with  None Identified  None Identified  None Identified  None Identified  None Identified   Comments  lost husband 2 years ago and has lost contact with his children which saddens her. she was only married for 2 years.  lost husband 2 years ago and has lost contact with his children which saddens her. she was only married for 2 years.  lost husband 2 years ago and has lost contact with his children which saddens her. she was only married for 2 years.  lost husband 2 years ago and has lost contact with his children which saddens her. she was only married for 2 years.  lost husband 2 years ago and has lost contact with his children which saddens her. she was only married for 2 years.   Expected Outcomes  patient will remain free from psychosocial barriers to participation  patient will remain free from psychosocial barriers to participation  patient will remain free from psychosocial barriers to participation  patient will remain free from psychosocial barriers to participation  patient will remain free from psychosocial barriers to participation   Interventions  Encouraged to attend Pulmonary Rehabilitation for the exercise  Encouraged to attend Pulmonary Rehabilitation for the exercise  Encouraged to attend Pulmonary Rehabilitation for the exercise  Encouraged to attend Pulmonary Rehabilitation for the exercise  Encouraged to attend Pulmonary Rehabilitation for the exercise   Continue Psychosocial Services   Follow up required by staff  Follow up required by staff  Follow up required by staff  Follow up required by staff  Follow up required by staff   [place] Name 03/27/17 0906             Psychosocial Re-Evaluation   Current issues  with  None Identified       Comments  no psychosocial issues identified at this time       Expected Outcomes  patient will remain free from psychosocial barriers to participation       Interventions  Encouraged to attend Pulmonary Rehabilitation for the exercise       Continue Psychosocial Services   No Follow up required          Psychosocial Discharge (Final Psychosocial Re-Evaluation): Psychosocial Re-Evaluation - 03/27/17 0906      Psychosocial Re-Evaluation   Current issues with  None Identified    Comments  no psychosocial issues identified at this time    Expected Outcomes  patient will remain free from psychosocial barriers to participation    Interventions  Encouraged to attend Pulmonary Rehabilitation for the exercise    Continue Psychosocial Services   No Follow up required       Education: Education Goals: Education classes will be provided on a weekly basis, covering required topics. Participant will state understanding/return demonstration of topics presented.  Learning Barriers/Preferences: Learning Barriers/Preferences - 11/07/16 1501      Learning Barriers/Preferences   Learning Barriers  None    Learning Preferences  Group Instruction;Individual Instruction;Verbal Instruction;Written Material       Education Topics: Risk Factor Reduction:  -Group instruction that is supported by a PowerPoint presentation. Instructor discusses the definition of a risk factor, different risk factors for pulmonary disease, and how the heart and lungs work together.     Nutrition for Pulmonary Patient:  -Group instruction provided by PowerPoint slides, verbal discussion, and written materials to support subject matter. The instructor gives an explanation and review of healthy diet recommendations, which includes a discussion on weight management, recommendations for fruit and vegetable consumption, as well as protein, fluid, caffeine, fiber, sodium, sugar, and alcohol. Tips for  eating when patients are short of breath are discussed.   PULMONARY REHAB OTHER RESPIRATORY from 03/26/2017 in [place]  Date  11/27/16  Educator  edna  Instruction Review Code  2- meets goals/outcomes      Pursed Lip Breathing:  -Group instruction that is supported by demonstration and informational handouts. Instructor discusses the benefits of pursed lip and diaphragmatic breathing and detailed demonstration on how to preform both.     Oxygen Safety:  -Group instruction provided by PowerPoint, verbal discussion, and written material to support subject matter. There is an overview of "What is Oxygen" and "Why do we need it".  Instructor also reviews how to create a safe environment for oxygen use, the importance of using oxygen as prescribed, and the risks of noncompliance. There is a brief discussion on traveling with oxygen and resources the patient may utilize.   PULMONARY REHAB OTHER RESPIRATORY from 03/26/2017 in [place]  Date  11/13/16  Educator  Truddie Crumble  Instruction Review Code  2- meets goals/outcomes      Oxygen Equipment:  -Group instruction provided by [employer] Staff utilizing handouts, written materials, and equipment demonstrations.   Signs and Symptoms:  -Group instruction provided by written material and verbal discussion to support subject matter. Warning signs and symptoms of infection, stroke, and heart attack are reviewed and when to call the physician/911 reinforced. Tips for preventing the spread of infection discussed.   PULMONARY REHAB OTHER RESPIRATORY from 03/26/2017 in [place]  Date  12/25/16  Educator  rn  Instruction Review Code  2- meets goals/outcomes      Advanced Directives:  -Group instruction provided by verbal instruction and written material to support subject matter. Instructor reviews Advanced Directive laws and proper instruction for  filling out document.   Pulmonary Video:  -Group video education that reviews the importance of medication and oxygen compliance, exercise, good nutrition, pulmonary hygiene, and pursed lip and diaphragmatic breathing for the pulmonary patient.   PULMONARY REHAB OTHER RESPIRATORY from 03/26/2017 in [place]  Date  01/01/17  Instruction Review Code  2- meets goals/outcomes      Exercise for the Pulmonary Patient:  -Group instruction that is supported by a PowerPoint presentation. Instructor discusses benefits of exercise,  core components of exercise, frequency, duration, and intensity of an exercise routine, importance of utilizing pulse oximetry during exercise, safety while exercising, and options of places to exercise outside of rehab.     PULMONARY REHAB OTHER RESPIRATORY from 03/26/2017 in [place]  Date  12/16/16  Educator  ep  Instruction Review Code  2- meets goals/outcomes      Pulmonary Medications:  -Verbally interactive group education provided by instructor with focus on inhaled medications and proper administration.   PULMONARY REHAB OTHER RESPIRATORY from 03/26/2017 in [place]  Date  02/05/17  Educator  pharmacist  Instruction Review Code  R- Review/reinforce      Anatomy and Physiology of the Respiratory System and Intimacy:  -Group instruction provided by PowerPoint, verbal discussion, and written material to support subject matter. Instructor reviews respiratory cycle and anatomical components of the respiratory system and their functions. Instructor also reviews differences in obstructive and restrictive respiratory diseases with examples of each. Intimacy, Sex, and Sexuality differences are reviewed with a discussion on how relationships can change when diagnosed with pulmonary disease. Common sexual concerns are reviewed.   MD DAY -A group question and answer  session with a medical doctor that allows participants to ask questions that relate to their pulmonary disease state.   PULMONARY REHAB OTHER RESPIRATORY from 03/26/2017 in [place]  Date  01/13/17  Educator  [email or web address]  Instruction Review Code  2- meets goals/outcomes      OTHER EDUCATION -Group or individual verbal, written, or video instructions that support the educational goals of the pulmonary rehab program.   PULMONARY REHAB OTHER RESPIRATORY from 03/26/2017 in [place]  Date  02/12/17  Educator  Parke Simmers  Instruction Review Code  1- Verbalizes Understanding      Knowledge Questionnaire Score:   Core Components/Risk Factors/Patient Goals at Admission: Personal Goals and Risk Factors at Admission - 11/07/16 1504      Core Components/Risk Factors/Patient Goals on Admission   Improve shortness of breath with ADL's  Yes    Intervention  Provide education, individualized exercise plan and daily activity instruction to help decrease symptoms of SOB with activities of daily living.    Expected Outcomes  Short Term: Achieves a reduction of symptoms when performing activities of daily living.    Develop more efficient breathing techniques such as purse lipped breathing and diaphragmatic breathing; and practicing self-pacing with activity  Yes    Intervention  Provide education, demonstration and support about specific breathing techniuqes utilized for more efficient breathing. Include techniques such as pursed lipped breathing, diaphragmatic breathing and self-pacing activity.    Expected Outcomes  Short Term: Participant will be able to demonstrate and use breathing techniques as needed throughout daily activities.    Increase knowledge of respiratory medications and ability to use respiratory devices properly   Yes    Intervention  Provide education and demonstration as needed of appropriate use of medications, inhalers, and  oxygen therapy.    Expected Outcomes  Short Term: Achieves understanding of medications use. Understands that oxygen is a medication prescribed by physician. Demonstrates appropriate use of inhaler and oxygen therapy.       Core Components/Risk Factors/Patient Goals Review:  Goals and Risk Factor Review    Row Name 12/03/16 2109 12/26/16 1354 01/26/17 0813 02/17/17 1007 03/12/17 0915     Core Components/Risk Factors/Patient Goals Review   Personal Goals Review  Improve shortness  of breath with ADL's;Increase knowledge of respiratory medications and ability to use respiratory devices properly.;Develop more efficient breathing techniques such as purse lipped breathing and diaphragmatic breathing and practicing self-pacing with activity.  Improve shortness of breath with ADL's;Increase knowledge of respiratory medications and ability to use respiratory devices properly.;Develop more efficient breathing techniques such as purse lipped breathing and diaphragmatic breathing and practicing self-pacing with activity.  Improve shortness of breath with ADL's;Increase knowledge of respiratory medications and ability to use respiratory devices properly.;Develop more efficient breathing techniques such as purse lipped breathing and diaphragmatic breathing and practicing self-pacing with activity.  Improve shortness of breath with ADL's;Increase knowledge of respiratory medications and ability to use respiratory devices properly.;Develop more efficient breathing techniques such as purse lipped breathing and diaphragmatic breathing and practicing self-pacing with activity.  Improve shortness of breath with ADL's;Increase knowledge of respiratory medications and ability to use respiratory devices properly.;Develop more efficient breathing techniques such as purse lipped breathing and diaphragmatic breathing and practicing self-pacing with activity.   Review  patient is beginning to use plb technique without prompting.  she has not had any improvement in her shortness of breath at this point in rehab however she has missed several sessions.  patient is beginning to use plb technique without prompting. she has not had any improvement in her shortness of breath at this point in rehab however she has missed several sessions. will stress with patient importance of consistant attendance in order to improve stamina and strength as she wishes  patients attendace has been much improved over the past 30 days. She is showing improvement in her SOB and utilizing PLB without cueing.  seeing slow improvement in strength and stamina, would benefit from extending to 36 sessions  Continues to slowly improve, walking continues to be difficult for [facility], still unable to walk independently from the parking lot   Expected Outcomes  see admission outcomes  see admission outcomes  see admission outcomes  see admission outcomes  see admission outcomes   Row Name 03/27/17 0905             Core Components/Risk Factors/Patient Goals Review   Personal Goals Review  Improve shortness of breath with ADL's;Increase knowledge of respiratory medications and ability to use respiratory devices properly.;Develop more efficient breathing techniques such as purse lipped breathing and diaphragmatic breathing and practicing self-pacing with activity.       Review  Continues to slowly improve, walking continues to be difficult for [facility], still unable to walk independently from the parking lot       Expected Outcomes  see admission outcomes          Core Components/Risk Factors/Patient Goals at Discharge (Final Review):  Goals and Risk Factor Review - 03/27/17 0905      Core Components/Risk Factors/Patient Goals Review   Personal Goals Review  Improve shortness of breath with ADL's;Increase knowledge of respiratory medications and ability to use respiratory devices properly.;Develop more efficient breathing techniques such as purse lipped breathing  and diaphragmatic breathing and practicing self-pacing with activity.    Review  Continues to slowly improve, walking continues to be difficult for [place], still unable to walk independently from the parking lot    Expected Outcomes  see admission outcomes       ITP Comments:   Comments: ITP REVIEW Pt is making expected progress toward pulmonary rehab goals after completing 29 sessions. Recommend continued exercise, life style modification, education, and utilization of breathing techniques to increase stamina and strength and  decrease shortness of breath with exertion.

## 2017-04-09 ENCOUNTER — Encounter (HOSPITAL_COMMUNITY): Payer: Medicare Other

## 2017-04-14 ENCOUNTER — Encounter (HOSPITAL_COMMUNITY)
Admission: RE | Admit: 2017-04-14 | Discharge: 2017-04-14 | Disposition: A | Discharge status: Home / Self Care | Payer: Medicare Other | Source: Ambulatory Visit | Attending: Cardiology | Admitting: Cardiology

## 2017-04-14 VITALS — Wt 187.6 lb

## 2017-04-14 DIAGNOSIS — N183 Chronic kidney disease, stage 3 (moderate): Secondary | ICD-10-CM | POA: Insufficient documentation

## 2017-04-14 DIAGNOSIS — H3530 Unspecified macular degeneration: Secondary | ICD-10-CM | POA: Insufficient documentation

## 2017-04-14 DIAGNOSIS — Z87891 Personal history of nicotine dependence: Secondary | ICD-10-CM | POA: Insufficient documentation

## 2017-04-14 DIAGNOSIS — G4733 Obstructive sleep apnea (adult) (pediatric): Secondary | ICD-10-CM | POA: Insufficient documentation

## 2017-04-14 DIAGNOSIS — E039 Hypothyroidism, unspecified: Secondary | ICD-10-CM | POA: Insufficient documentation

## 2017-04-14 DIAGNOSIS — Z79899 Other long term (current) drug therapy: Secondary | ICD-10-CM | POA: Diagnosis not present

## 2017-04-14 DIAGNOSIS — J449 Chronic obstructive pulmonary disease, unspecified: Secondary | ICD-10-CM | POA: Diagnosis not present

## 2017-04-14 DIAGNOSIS — I5032 Chronic diastolic (congestive) heart failure: Secondary | ICD-10-CM | POA: Insufficient documentation

## 2017-04-14 DIAGNOSIS — I481 Persistent atrial fibrillation: Secondary | ICD-10-CM | POA: Diagnosis not present

## 2017-04-14 DIAGNOSIS — M519 Unspecified thoracic, thoracolumbar and lumbosacral intervertebral disc disorder: Secondary | ICD-10-CM | POA: Insufficient documentation

## 2017-04-14 DIAGNOSIS — Z7901 Long term (current) use of anticoagulants: Secondary | ICD-10-CM | POA: Insufficient documentation

## 2017-04-14 DIAGNOSIS — K219 Gastro-esophageal reflux disease without esophagitis: Secondary | ICD-10-CM | POA: Diagnosis not present

## 2017-04-14 DIAGNOSIS — R001 Bradycardia, unspecified: Secondary | ICD-10-CM | POA: Diagnosis not present

## 2017-04-14 DIAGNOSIS — I130 Hypertensive heart and chronic kidney disease with heart failure and stage 1 through stage 4 chronic kidney disease, or unspecified chronic kidney disease: Secondary | ICD-10-CM | POA: Diagnosis not present

## 2017-04-14 DIAGNOSIS — I272 Pulmonary hypertension, unspecified: Secondary | ICD-10-CM | POA: Insufficient documentation

## 2017-04-14 DIAGNOSIS — E785 Hyperlipidemia, unspecified: Secondary | ICD-10-CM | POA: Diagnosis not present

## 2017-04-14 DIAGNOSIS — I2510 Atherosclerotic heart disease of native coronary artery without angina pectoris: Secondary | ICD-10-CM | POA: Diagnosis not present

## 2017-04-14 NOTE — Progress Notes (Signed)
Daily Session Note  Patient Details  Name: Amy [name] MRN: [id number] Date of Birth: 10/05/1945 Referring Provider:     Pulmonary Rehab Walk Test from 11/11/2016 in [place]  Referring Provider  Dr. Aundra Dubin      Encounter Date: 04/14/2017  Check In: Session Check In - 04/14/17 1504      Check-In   Location  [facility]    Staff Present  Rosebud Poles, RN, Luisa Hart, RN, BSN    Supervising physician immediately available to respond to emergencies  [facility] immediately available    Physician(s)  Dr. Eliseo Squires    Medication changes reported      No    Fall or balance concerns reported     No    Tobacco Cessation  No Change    Warm-up and Cool-down  Performed as group-led instruction    Resistance Training Performed  Yes    VAD Patient?  No      Pain Assessment   Currently in Pain?  No/denies    Multiple Pain Sites  No       Capillary Blood Glucose: No results found for this or any previous visit (from the past 24 hour(s)).  Exercise Prescription Changes - 04/14/17 1500      Response to Exercise   Blood Pressure (Admit)  102/60    Blood Pressure (Exercise)  100/70    Blood Pressure (Exit)  100/56    Heart Rate (Admit)  67 bpm    Heart Rate (Exercise)  82 bpm    Heart Rate (Exit)  58 bpm    Oxygen Saturation (Admit)  90 %    Oxygen Saturation (Exercise)  90 %    Oxygen Saturation (Exit)  96 %    Rating of Perceived Exertion (Exercise)  12    Perceived Dyspnea (Exercise)  1    Duration  Progress to 45 minutes of aerobic exercise without signs/symptoms of physical distress    Intensity  THRR unchanged      Progression   Progression  Continue to progress workloads to maintain intensity without signs/symptoms of physical distress.      Resistance Training   Training Prescription  Yes    Weight  oange bands    Reps  10-15    Time  10 Minutes      Oxygen   Oxygen  Continuous      Recumbant Bike   Level  4    Minutes  17      NuStep   Level  6    Minutes  17    METs  1.6      Track   Laps  8    Minutes  17       Social History   Tobacco Use  Smoking Status Former Smoker  . Packs/day: 0.50  . Years: 44.00  . Pack years: 22.00  . Types: Cigarettes  . Last attempt to quit: 04/07/2009  . Years since quitting: 8.0  Smokeless Tobacco Never Used    Goals Met:  Exercise tolerated well Strength training completed today  Goals Unmet:  Not Applicable  Comments: Service time is from 1330 to 1515    Dr. Rush Farmer is Medical Director for Pulmonary Rehab at [facility].

## 2017-04-16 ENCOUNTER — Encounter (HOSPITAL_COMMUNITY)
Admission: RE | Admit: 2017-04-16 | Discharge: 2017-04-16 | Disposition: A | Discharge status: Home / Self Care | Payer: Medicare Other | Source: Ambulatory Visit | Attending: Cardiology | Admitting: Cardiology

## 2017-04-16 VITALS — Wt 188.7 lb

## 2017-04-16 DIAGNOSIS — I272 Pulmonary hypertension, unspecified: Secondary | ICD-10-CM

## 2017-04-16 DIAGNOSIS — Z79899 Other long term (current) drug therapy: Secondary | ICD-10-CM | POA: Diagnosis not present

## 2017-04-16 DIAGNOSIS — N183 Chronic kidney disease, stage 3 (moderate): Secondary | ICD-10-CM | POA: Diagnosis not present

## 2017-04-16 DIAGNOSIS — I5032 Chronic diastolic (congestive) heart failure: Secondary | ICD-10-CM | POA: Diagnosis not present

## 2017-04-16 DIAGNOSIS — I2510 Atherosclerotic heart disease of native coronary artery without angina pectoris: Secondary | ICD-10-CM | POA: Diagnosis not present

## 2017-04-16 DIAGNOSIS — Z7901 Long term (current) use of anticoagulants: Secondary | ICD-10-CM | POA: Diagnosis not present

## 2017-04-16 NOTE — Progress Notes (Signed)
Daily Session Note  Patient Details  Name: Amy [name] MRN: [id number] Date of Birth: 10-15-1945 Referring Provider:     Pulmonary Rehab Walk Test from 11/11/2016 in [place]  Referring Provider  Dr. Aundra Dubin      Encounter Date: 04/16/2017  Check In: Session Check In - 04/16/17 1330      Check-In   Location  [facility]    Staff Present  Rosebud Poles, RN, Luisa Hart, RN, BSN;Molly diVincenzo, MS, ACSM RCEP, Exercise Physiologist;Mahkai Fangman Ysidro Evert, RN    Supervising physician immediately available to respond to emergencies  [facility] immediately available    Physician(s)  Dr. Cruzita Lederer    Medication changes reported      No    Fall or balance concerns reported     No    Tobacco Cessation  No Change    Warm-up and Cool-down  Performed as group-led instruction    Resistance Training Performed  Yes    VAD Patient?  No      Pain Assessment   Currently in Pain?  No/denies    Multiple Pain Sites  No       Capillary Blood Glucose: No results found for this or any previous visit (from the past 24 hour(s)).  Exercise Prescription Changes - 04/16/17 1500      Response to Exercise   Blood Pressure (Admit)  110/64    Blood Pressure (Exercise)  118/70    Blood Pressure (Exit)  112/66    Heart Rate (Admit)  84 bpm    Heart Rate (Exercise)  82 bpm    Heart Rate (Exit)  72 bpm    Oxygen Saturation (Admit)  90 %    Oxygen Saturation (Exercise)  90 %    Oxygen Saturation (Exit)  92 %    Rating of Perceived Exertion (Exercise)  15    Perceived Dyspnea (Exercise)  2    Duration  Progress to 45 minutes of aerobic exercise without signs/symptoms of physical distress    Intensity  THRR unchanged      Progression   Progression  Continue to progress workloads to maintain intensity without signs/symptoms of physical distress.      Resistance Training   Training Prescription  Yes    Weight  orange bands    Reps  10-15    Time  10 Minutes      Oxygen   Oxygen  Continuous    Liters  6-8      NuStep   Level  6    Minutes  17    METs  1.8      Track   Laps  6    Minutes  17       Social History   Tobacco Use  Smoking Status Former Smoker  . Packs/day: 0.50  . Years: 44.00  . Pack years: 22.00  . Types: Cigarettes  . Last attempt to quit: 04/07/2009  . Years since quitting: 8.0  Smokeless Tobacco Never Used    Goals Met:  Exercise tolerated well No report of cardiac concerns or symptoms Strength training completed today  Goals Unmet:  Not Applicable  Comments: Service time is from 1330 to 1520    Dr. Rush Farmer is Medical Director for Pulmonary Rehab at [facility].

## 2017-04-21 ENCOUNTER — Encounter (HOSPITAL_COMMUNITY)
Admission: RE | Admit: 2017-04-21 | Discharge: 2017-04-21 | Disposition: A | Discharge status: Home / Self Care | Payer: Medicare Other | Source: Ambulatory Visit | Attending: Cardiology | Admitting: Cardiology

## 2017-04-21 VITALS — Wt 187.8 lb

## 2017-04-21 DIAGNOSIS — I272 Pulmonary hypertension, unspecified: Secondary | ICD-10-CM | POA: Diagnosis not present

## 2017-04-21 DIAGNOSIS — I2510 Atherosclerotic heart disease of native coronary artery without angina pectoris: Secondary | ICD-10-CM | POA: Diagnosis not present

## 2017-04-21 DIAGNOSIS — Z79899 Other long term (current) drug therapy: Secondary | ICD-10-CM | POA: Diagnosis not present

## 2017-04-21 DIAGNOSIS — N183 Chronic kidney disease, stage 3 (moderate): Secondary | ICD-10-CM | POA: Diagnosis not present

## 2017-04-21 DIAGNOSIS — Z7901 Long term (current) use of anticoagulants: Secondary | ICD-10-CM | POA: Diagnosis not present

## 2017-04-21 DIAGNOSIS — I5032 Chronic diastolic (congestive) heart failure: Secondary | ICD-10-CM | POA: Diagnosis not present

## 2017-04-21 NOTE — Progress Notes (Signed)
Daily Session Note  Patient Details  Name: Amy [name] MRN: [id number] Date of Birth: [birth year] Referring Provider:     Pulmonary Rehab Walk Test from 11/11/2016 in [place]  Referring Provider  Dr. Aundra Dubin      Encounter Date: 04/21/2017  Check In: Session Check In - 04/21/17 1330      Check-In   Location  [facility]    Staff Present  Rosebud Poles, RN, Luisa Hart, RN, BSN;Ronni Osterberg, MS, ACSM RCEP, Exercise Physiologist;Lisa Ysidro Evert, RN    Supervising physician immediately available to respond to emergencies  [facility] immediately available    Physician(s)  Dr. Eliseo Squires    Medication changes reported      No    Fall or balance concerns reported     No    Tobacco Cessation  No Change    Warm-up and Cool-down  Performed as group-led instruction    Resistance Training Performed  Yes    VAD Patient?  No      Pain Assessment   Currently in Pain?  No/denies    Multiple Pain Sites  No       Capillary Blood Glucose: No results found for this or any previous visit (from the past 24 hour(s)).  Exercise Prescription Changes - 04/21/17 1500      Response to Exercise   Blood Pressure (Admit)  100/60    Blood Pressure (Exercise)  104/60    Blood Pressure (Exit)  104/70    Heart Rate (Admit)  64 bpm    Heart Rate (Exercise)  88 bpm    Heart Rate (Exit)  60 bpm    Oxygen Saturation (Admit)  89 %    Oxygen Saturation (Exercise)  92 %    Oxygen Saturation (Exit)  95 %    Rating of Perceived Exertion (Exercise)  15    Perceived Dyspnea (Exercise)  3    Duration  Progress to 45 minutes of aerobic exercise without signs/symptoms of physical distress    Intensity  THRR unchanged      Progression   Progression  Continue to progress workloads to maintain intensity without signs/symptoms of physical distress.      Resistance Training   Training Prescription  Yes    Weight  orange bands    Reps  10-15    Time   10 Minutes      Oxygen   Oxygen  Continuous    Liters  6-8      Recumbant Bike   Level  4    Minutes  17      NuStep   Level  6    Minutes  17    METs  1.6      Track   Laps  9    Minutes  17       Social History   Tobacco Use  Smoking Status Former Smoker  . Packs/day: 0.50  . Years: 44.00  . Pack years: 22.00  . Types: Cigarettes  . Last attempt to quit: 04/07/2009  . Years since quitting: 8.0  Smokeless Tobacco Never Used    Goals Met:  Exercise tolerated well Queuing for purse lip breathing No report of cardiac concerns or symptoms  Goals Unmet:  Not Applicable  Comments: Service time is from 1:30p to 2:45p    Dr. Rush Farmer is Medical Director for Pulmonary Rehab at [facility].

## 2017-04-23 ENCOUNTER — Encounter (HOSPITAL_COMMUNITY): Payer: Medicare Other

## 2017-04-28 ENCOUNTER — Telehealth (HOSPITAL_COMMUNITY): Payer: Self-pay | Admitting: *Deleted

## 2017-04-28 ENCOUNTER — Encounter (HOSPITAL_COMMUNITY)
Admission: RE | Admit: 2017-04-28 | Discharge: 2017-04-28 | Disposition: A | Discharge status: Home / Self Care | Payer: Medicare Other | Source: Ambulatory Visit | Attending: Family Medicine | Admitting: Family Medicine

## 2017-04-28 VITALS — Wt 187.8 lb

## 2017-04-28 DIAGNOSIS — I5032 Chronic diastolic (congestive) heart failure: Secondary | ICD-10-CM | POA: Diagnosis not present

## 2017-04-28 DIAGNOSIS — N183 Chronic kidney disease, stage 3 (moderate): Secondary | ICD-10-CM | POA: Diagnosis not present

## 2017-04-28 DIAGNOSIS — Z7901 Long term (current) use of anticoagulants: Secondary | ICD-10-CM | POA: Diagnosis not present

## 2017-04-28 DIAGNOSIS — I272 Pulmonary hypertension, unspecified: Secondary | ICD-10-CM | POA: Diagnosis not present

## 2017-04-28 DIAGNOSIS — I2510 Atherosclerotic heart disease of native coronary artery without angina pectoris: Secondary | ICD-10-CM | POA: Diagnosis not present

## 2017-04-28 DIAGNOSIS — Z79899 Other long term (current) drug therapy: Secondary | ICD-10-CM | POA: Diagnosis not present

## 2017-04-28 NOTE — Telephone Encounter (Signed)
Patient requesting to participate in pulmonary maintenance program as she is about to graduate from the [facility] pulmonary rehab program.  Order placed per Dr. Aundra Dubin.

## 2017-04-28 NOTE — Progress Notes (Signed)
Daily Session Note  Patient Details  Name: Amy [name] MRN: [id number] Date of Birth: 07-23-1945 Referring Provider:     Pulmonary Rehab Walk Test from 11/11/2016 in [place]  Referring Provider  Dr. Aundra Dubin      Encounter Date: 04/28/2017  Check In: Session Check In - 04/28/17 1335      Check-In   Location  [facility]    Staff Present  Rosebud Poles, RN, Luisa Hart, RN, BSN;Molly diVincenzo, MS, ACSM RCEP, Exercise Physiologist;Anae Hams Ysidro Evert, RN    Supervising physician immediately available to respond to emergencies  [facility] immediately available    Physician(s)  Dr. Zigmund Daniel    Medication changes reported      No    Fall or balance concerns reported     No    Tobacco Cessation  No Change    Warm-up and Cool-down  Performed as group-led instruction    Resistance Training Performed  Yes    VAD Patient?  No      Pain Assessment   Currently in Pain?  No/denies    Multiple Pain Sites  No       Capillary Blood Glucose: No results found for this or any previous visit (from the past 24 hour(s)).  Exercise Prescription Changes - 04/28/17 1500      Response to Exercise   Blood Pressure (Admit)  124/64    Blood Pressure (Exercise)  112/64    Blood Pressure (Exit)  104/60    Heart Rate (Admit)  65 bpm    Heart Rate (Exercise)  82 bpm    Heart Rate (Exit)  65 bpm    Oxygen Saturation (Admit)  87 %    Oxygen Saturation (Exercise)  89 %    Oxygen Saturation (Exit)  95 %    Rating of Perceived Exertion (Exercise)  13    Perceived Dyspnea (Exercise)  2    Duration  Progress to 45 minutes of aerobic exercise without signs/symptoms of physical distress    Intensity  THRR unchanged      Progression   Progression  Continue to progress workloads to maintain intensity without signs/symptoms of physical distress.      Resistance Training   Training Prescription  Yes    Weight  orange bands    Reps  10-15    Time  10 Minutes      Oxygen   Oxygen  Continuous    Liters  6-8      Recumbant Bike   Level  4    Minutes  17      NuStep   Level  6    Minutes  17    METs  1.6      Track   Laps  11    Minutes  17       Social History   Tobacco Use  Smoking Status Former Smoker  . Packs/day: 0.50  . Years: 44.00  . Pack years: 22.00  . Types: Cigarettes  . Last attempt to quit: 04/07/2009  . Years since quitting: 8.0  Smokeless Tobacco Never Used    Goals Met:  Exercise tolerated well No report of cardiac concerns or symptoms Strength training completed today  Goals Unmet:  Not Applicable  Comments: Service time is from 1330 to 1500    Dr. Rush Farmer is Medical Director for Pulmonary Rehab at [facility].

## 2017-04-29 ENCOUNTER — Telehealth (HOSPITAL_COMMUNITY): Payer: Self-pay | Admitting: *Deleted

## 2017-04-29 NOTE — Telephone Encounter (Signed)
Needs to come in for ECG

## 2017-04-29 NOTE — Telephone Encounter (Signed)
Pt called to report her heart rate at rest in the 130s pt took PRN metoprolol and heart rate was 58 but shortly after it went back to the 130's.   Message routed to [place] for advice.

## 2017-04-30 ENCOUNTER — Ambulatory Visit (HOSPITAL_COMMUNITY)
Admission: RE | Admit: 2017-04-30 | Discharge: 2017-04-30 | Disposition: A | Discharge status: Home / Self Care | Payer: Medicare Other | Source: Ambulatory Visit | Attending: Internal Medicine | Admitting: Internal Medicine

## 2017-04-30 ENCOUNTER — Encounter (HOSPITAL_COMMUNITY): Payer: Medicare Other

## 2017-04-30 ENCOUNTER — Telehealth (HOSPITAL_COMMUNITY): Payer: Self-pay | Admitting: Family Medicine

## 2017-04-30 DIAGNOSIS — I491 Atrial premature depolarization: Secondary | ICD-10-CM | POA: Diagnosis not present

## 2017-04-30 DIAGNOSIS — I509 Heart failure, unspecified: Secondary | ICD-10-CM | POA: Diagnosis not present

## 2017-04-30 MED ORDER — METOPROLOL TARTRATE 25 MG PO TABS: 25.0000 mg | ORAL_TABLET | Freq: Two times a day (BID) | ORAL | 3 refills | Status: DC

## 2017-04-30 NOTE — Patient Instructions (Signed)
START taking Metoprolol 25 mg (1 Tablet) twice Daily.  Follow up as scheduled.

## 2017-04-30 NOTE — Telephone Encounter (Signed)
Attempted to reach patient no answer/left detailed voice message.

## 2017-04-30 NOTE — Telephone Encounter (Signed)
Spoke with patient added her to the nurses schedule today at 2pm.

## 2017-05-01 ENCOUNTER — Telehealth: Payer: Self-pay | Admitting: Cardiology

## 2017-05-01 NOTE — Telephone Encounter (Signed)
New message    Patient calling, states she needs equipment  1) What problem are you experiencing? New order needed  2) Who is your medical equipment company? [employer]   Please route to the sleep study assistant.

## 2017-05-01 NOTE — Telephone Encounter (Signed)
CPAP assistant will reach out to [facility] on Monday 05/04/17 to obtain a medical necessity form for the patient so she can get her supplies.

## 2017-05-05 ENCOUNTER — Telehealth: Payer: Self-pay | Admitting: Cardiology

## 2017-05-05 ENCOUNTER — Encounter (HOSPITAL_COMMUNITY)
Admission: RE | Admit: 2017-05-05 | Discharge: 2017-05-05 | Disposition: A | Discharge status: Home / Self Care | Payer: Medicare Other | Source: Ambulatory Visit | Attending: Cardiology | Admitting: Cardiology

## 2017-05-05 VITALS — Wt 188.7 lb

## 2017-05-05 DIAGNOSIS — N183 Chronic kidney disease, stage 3 (moderate): Secondary | ICD-10-CM | POA: Diagnosis not present

## 2017-05-05 DIAGNOSIS — I272 Pulmonary hypertension, unspecified: Secondary | ICD-10-CM | POA: Diagnosis not present

## 2017-05-05 DIAGNOSIS — Z7901 Long term (current) use of anticoagulants: Secondary | ICD-10-CM | POA: Diagnosis not present

## 2017-05-05 DIAGNOSIS — Z79899 Other long term (current) drug therapy: Secondary | ICD-10-CM | POA: Diagnosis not present

## 2017-05-05 DIAGNOSIS — I5032 Chronic diastolic (congestive) heart failure: Secondary | ICD-10-CM | POA: Diagnosis not present

## 2017-05-05 DIAGNOSIS — I2510 Atherosclerotic heart disease of native coronary artery without angina pectoris: Secondary | ICD-10-CM | POA: Diagnosis not present

## 2017-05-05 NOTE — Telephone Encounter (Signed)
Amy [name]

## 2017-05-05 NOTE — Telephone Encounter (Signed)
Left message to call back

## 2017-05-05 NOTE — Telephone Encounter (Signed)
Mrs.  Amy [name] -[place] Is calling because she takes Metoprolol Tartrate 25 mgs and she take a half a tablet as needed . But Dr. Aundra Dubin is telling her to take a 1 tablet twice a day and she has questions about that . Please call

## 2017-05-05 NOTE — Progress Notes (Signed)
Daily Session Note  Patient Details  Name: Amy [name] MRN: [id number] Date of Birth: [birth year] Referring Provider:     Pulmonary Rehab Walk Test from 11/11/2016 in [place]  Referring Provider  Dr. Aundra Dubin      Encounter Date: 05/05/2017  Check In: Session Check In - 05/05/17 1548      Check-In   Location  [facility]    Staff Present  Rosebud Poles, RN, BSN;Molly diVincenzo, MS, ACSM RCEP, Exercise Physiologist;Portia Rollene Rotunda, RN, BSN    Physician(s)  Dr. Bonner Puna    Medication changes reported      No    Fall or balance concerns reported     No    Warm-up and Cool-down  Performed as group-led instruction    Resistance Training Performed  Yes    VAD Patient?  No      Pain Assessment   Multiple Pain Sites  No       Capillary Blood Glucose: No results found for this or any previous visit (from the past 24 hour(s)).  Exercise Prescription Changes - 05/05/17 1600      Response to Exercise   Blood Pressure (Admit)  90/40    Blood Pressure (Exercise)  120/64    Blood Pressure (Exit)  96/62    Heart Rate (Admit)  63 bpm    Heart Rate (Exercise)  75 bpm    Heart Rate (Exit)  61 bpm    Oxygen Saturation (Admit)  89 %    Oxygen Saturation (Exercise)  87 %    Oxygen Saturation (Exit)  94 %    Rating of Perceived Exertion (Exercise)  13    Perceived Dyspnea (Exercise)  3    Duration  Progress to 45 minutes of aerobic exercise without signs/symptoms of physical distress    Intensity  THRR unchanged      Progression   Progression  Continue to progress workloads to maintain intensity without signs/symptoms of physical distress.      Resistance Training   Training Prescription  Yes    Weight  orange bands    Reps  10-15    Time  10 Minutes      Oxygen   Oxygen  Continuous    Liters  6-8      Recumbant Bike   Level  4    Minutes  17      NuStep   Level  6    Minutes  17    METs  1.6      Track   Laps  8    Minutes  17       Social History   Tobacco Use  Smoking Status Former Smoker  . Packs/day: 0.50  . Years: 44.00  . Pack years: 22.00  . Types: Cigarettes  . Last attempt to quit: 04/07/2009  . Years since quitting: 8.0  Smokeless Tobacco Never Used    Goals Met:  Exercise tolerated well No report of cardiac concerns or symptoms Strength training completed today  Goals Unmet:  Not Applicable  Comments: Service time is from 1330 to 1510    Dr. Rush Farmer is Medical Director for Pulmonary Rehab at [facility].

## 2017-05-05 NOTE — Telephone Encounter (Signed)
Returned call to patient. Informed patient to take meds as prescribed by Dr. Aundra Dubin and if she has any symptoms with increased dose to follow up with Dr. Aundra Dubin. Patient in agreement with plan and thanked me for the call.

## 2017-05-07 ENCOUNTER — Encounter (HOSPITAL_COMMUNITY)
Admission: RE | Admit: 2017-05-07 | Discharge: 2017-05-07 | Disposition: A | Discharge status: Home / Self Care | Payer: Medicare Other | Source: Ambulatory Visit | Attending: Cardiology | Admitting: Cardiology

## 2017-05-07 DIAGNOSIS — I272 Pulmonary hypertension, unspecified: Secondary | ICD-10-CM

## 2017-05-07 DIAGNOSIS — N183 Chronic kidney disease, stage 3 (moderate): Secondary | ICD-10-CM | POA: Diagnosis not present

## 2017-05-07 DIAGNOSIS — I2510 Atherosclerotic heart disease of native coronary artery without angina pectoris: Secondary | ICD-10-CM | POA: Diagnosis not present

## 2017-05-07 DIAGNOSIS — Z79899 Other long term (current) drug therapy: Secondary | ICD-10-CM | POA: Diagnosis not present

## 2017-05-07 DIAGNOSIS — I5032 Chronic diastolic (congestive) heart failure: Secondary | ICD-10-CM | POA: Diagnosis not present

## 2017-05-07 DIAGNOSIS — Z7901 Long term (current) use of anticoagulants: Secondary | ICD-10-CM | POA: Diagnosis not present

## 2017-05-12 ENCOUNTER — Encounter (HOSPITAL_COMMUNITY)
Admission: RE | Admit: 2017-05-12 | Discharge: 2017-05-12 | Disposition: A | Discharge status: Home / Self Care | Payer: Medicare Other | Source: Ambulatory Visit | Attending: Cardiology | Admitting: Cardiology

## 2017-05-12 DIAGNOSIS — J449 Chronic obstructive pulmonary disease, unspecified: Secondary | ICD-10-CM | POA: Insufficient documentation

## 2017-05-12 DIAGNOSIS — E785 Hyperlipidemia, unspecified: Secondary | ICD-10-CM | POA: Insufficient documentation

## 2017-05-12 DIAGNOSIS — Z7901 Long term (current) use of anticoagulants: Secondary | ICD-10-CM | POA: Insufficient documentation

## 2017-05-12 DIAGNOSIS — R001 Bradycardia, unspecified: Secondary | ICD-10-CM | POA: Insufficient documentation

## 2017-05-12 DIAGNOSIS — M519 Unspecified thoracic, thoracolumbar and lumbosacral intervertebral disc disorder: Secondary | ICD-10-CM | POA: Insufficient documentation

## 2017-05-12 DIAGNOSIS — K219 Gastro-esophageal reflux disease without esophagitis: Secondary | ICD-10-CM | POA: Insufficient documentation

## 2017-05-12 DIAGNOSIS — G4733 Obstructive sleep apnea (adult) (pediatric): Secondary | ICD-10-CM | POA: Insufficient documentation

## 2017-05-12 DIAGNOSIS — E039 Hypothyroidism, unspecified: Secondary | ICD-10-CM | POA: Insufficient documentation

## 2017-05-12 DIAGNOSIS — I5032 Chronic diastolic (congestive) heart failure: Secondary | ICD-10-CM | POA: Insufficient documentation

## 2017-05-12 DIAGNOSIS — I2510 Atherosclerotic heart disease of native coronary artery without angina pectoris: Secondary | ICD-10-CM | POA: Insufficient documentation

## 2017-05-12 DIAGNOSIS — I130 Hypertensive heart and chronic kidney disease with heart failure and stage 1 through stage 4 chronic kidney disease, or unspecified chronic kidney disease: Secondary | ICD-10-CM | POA: Insufficient documentation

## 2017-05-12 DIAGNOSIS — Z79899 Other long term (current) drug therapy: Secondary | ICD-10-CM | POA: Insufficient documentation

## 2017-05-12 DIAGNOSIS — I481 Persistent atrial fibrillation: Secondary | ICD-10-CM | POA: Insufficient documentation

## 2017-05-12 DIAGNOSIS — H3530 Unspecified macular degeneration: Secondary | ICD-10-CM | POA: Insufficient documentation

## 2017-05-12 DIAGNOSIS — Z87891 Personal history of nicotine dependence: Secondary | ICD-10-CM | POA: Insufficient documentation

## 2017-05-12 DIAGNOSIS — N183 Chronic kidney disease, stage 3 (moderate): Secondary | ICD-10-CM | POA: Insufficient documentation

## 2017-05-12 DIAGNOSIS — I272 Pulmonary hypertension, unspecified: Secondary | ICD-10-CM | POA: Insufficient documentation

## 2017-05-13 DIAGNOSIS — F321 Major depressive disorder, single episode, moderate: Secondary | ICD-10-CM | POA: Diagnosis not present

## 2017-05-13 DIAGNOSIS — E039 Hypothyroidism, unspecified: Secondary | ICD-10-CM | POA: Diagnosis not present

## 2017-05-14 ENCOUNTER — Encounter (HOSPITAL_COMMUNITY)
Admission: RE | Admit: 2017-05-14 | Discharge: 2017-05-14 | Disposition: A | Discharge status: Home / Self Care | Payer: Medicare Other | Source: Ambulatory Visit | Attending: Cardiology | Admitting: Cardiology

## 2017-05-14 NOTE — Progress Notes (Signed)
Amy [name] started the Pulmonary Maintenance program 05/12/17. She tolerated exercise well without c/o.

## 2017-05-14 NOTE — Telephone Encounter (Signed)
Per Kennyth Lose at [facility] (Re-supply team) the medical necessity forms have been received and [facility] will call the patient to get her supplies to her.

## 2017-05-15 ENCOUNTER — Other Ambulatory Visit (HOSPITAL_COMMUNITY): Payer: Self-pay | Admitting: *Deleted

## 2017-05-15 MED ORDER — SELEXIPAG 200 MCG PO TABS: 1000.0000 ug | ORAL_TABLET | Freq: Two times a day (BID) | ORAL | 11 refills | Status: DC

## 2017-05-19 ENCOUNTER — Encounter (HOSPITAL_COMMUNITY)
Admission: RE | Admit: 2017-05-19 | Discharge: 2017-05-19 | Disposition: A | Discharge status: Home / Self Care | Payer: Medicare Other | Source: Ambulatory Visit | Attending: Cardiology | Admitting: Cardiology

## 2017-05-21 ENCOUNTER — Encounter (HOSPITAL_COMMUNITY)
Admission: RE | Admit: 2017-05-21 | Discharge: 2017-05-21 | Disposition: A | Discharge status: Home / Self Care | Payer: Medicare Other | Source: Ambulatory Visit | Attending: Cardiology | Admitting: Cardiology

## 2017-05-25 NOTE — Progress Notes (Signed)
Discharge Progress Report  Patient Details  Name: Amy [name] MRN: [id number] Date of Birth: 1945-10-14 Referring Provider:     Pulmonary Rehab Walk Test from 11/11/2016 in [place]  Referring Provider  Dr. Aundra Dubin       Number of Visits: 88  Reason for Discharge:  Patient reached a stable level of exercise.  Smoking History:  Social History   Tobacco Use  Smoking Status Former Smoker  . Packs/day: 0.50  . Years: 44.00  . Pack years: 22.00  . Types: Cigarettes  . Last attempt to quit: 04/07/2009  . Years since quitting: 8.1  Smokeless Tobacco Never Used    Diagnosis:  Pulmonary hypertension ([place])  ADL UCSD: Pulmonary Assessment Scores    Row Name 05/11/17 [phone] 05/19/17 0949       ADL UCSD   ADL Phase  Exit  Exit    SOB Score total  -  57      CAT Score   CAT Score  -  16      mMRC Score   mMRC Score  3  -       Initial Exercise Prescription:   Discharge Exercise Prescription (Final Exercise Prescription Changes): Exercise Prescription Changes - 05/05/17 1600      Response to Exercise   Blood Pressure (Admit)  90/40    Blood Pressure (Exercise)  120/64    Blood Pressure (Exit)  96/62    Heart Rate (Admit)  63 bpm    Heart Rate (Exercise)  75 bpm    Heart Rate (Exit)  61 bpm    Oxygen Saturation (Admit)  89 %    Oxygen Saturation (Exercise)  87 %    Oxygen Saturation (Exit)  94 %    Rating of Perceived Exertion (Exercise)  13    Perceived Dyspnea (Exercise)  3    Duration  Progress to 45 minutes of aerobic exercise without signs/symptoms of physical distress    Intensity  THRR unchanged      Progression   Progression  Continue to progress workloads to maintain intensity without signs/symptoms of physical distress.      Resistance Training   Training Prescription  Yes    Weight  orange bands    Reps  10-15    Time  10 Minutes      Oxygen   Oxygen  Continuous    Liters  6-8      Recumbant Bike    Level  4    Minutes  17      NuStep   Level  6    Minutes  17    METs  1.6      Track   Laps  8    Minutes  17       Functional Capacity: 6 Minute Walk    Row Name 05/11/17 0729         6 Minute Walk   Phase  Discharge     Distance  1100 feet     Distance Feet Change  370 ft     Walk Time  6 minutes     # of Rest Breaks  0     MPH  2.08     METS  2.53     RPE  12     Perceived Dyspnea   3     Symptoms  Yes (comment)     Comments  wheelchair     Resting HR  56 bpm  Resting BP  100/70     Resting Oxygen Saturation   96 %     Exercise Oxygen Saturation  during 6 min walk  93 %     Max Ex. HR  64 bpm     Max Ex. BP  118/74       Interval HR   1 Minute HR  59     2 Minute HR  61     3 Minute HR  61     2 Minute Post HR  64     Interval Heart Rate?  Yes       Interval Oxygen   Interval Oxygen?  Yes     Baseline Oxygen Saturation %  96 %     1 Minute Oxygen Saturation %  80 %     1 Minute Liters of Oxygen  8 L     2 Minute Oxygen Saturation %  93 %     2 Minute Liters of Oxygen  10 L     3 Minute Oxygen Saturation %  93 %     3 Minute Liters of Oxygen  10 L     4 Minute Oxygen Saturation %  - Pulse Ox would not give reading     4 Minute Liters of Oxygen  10 L     5 Minute Oxygen Saturation %  - Pulse Ox would not give reading     5 Minute Liters of Oxygen  10 L     6 Minute Oxygen Saturation %  - Pulse ox would not give reading     6 Minute Liters of Oxygen  10 L     2 Minute Post Oxygen Saturation %  93 %     2 Minute Post Liters of Oxygen  10 L        Psychological, QOL, Others - Outcomes: PHQ 2/9: Depression screen [facility] 2/9 05/07/2017 11/07/2016 10/17/2016 10/13/2016 10/06/2016  Decreased Interest _0 Down, Depressed, Hopeless 1 0 0 1 1  PHQ - 2 Score _1 Altered sleeping 3 - - 1 1  Tired, decreased energy 3 - - 1 1  Change in appetite 0 - - 0 0  Feeling bad or failure about yourself  1 - - 0 -  Trouble concentrating 0 - - 0 0  Moving  slowly or fidgety/restless 0 - - 0 0  Suicidal thoughts 1 - - 0 0  PHQ-9 Score 10 - - 4 4  Difficult doing work/chores Not difficult at all - - Not difficult at all -  Some recent data might be hidden    Quality of Life:   Personal Goals: Goals established at orientation with interventions provided to work toward goal.    Personal Goals Discharge: Goals and Risk Factor Review    Row Name 12/03/16 2109 12/26/16 1354 01/26/17 0813 02/17/17 1007 03/12/17 0915     Core Components/Risk Factors/Patient Goals Review   Personal Goals Review  Improve shortness of breath with ADL's;Increase knowledge of respiratory medications and ability to use respiratory devices properly.;Develop more efficient breathing techniques such as purse lipped breathing and diaphragmatic breathing and practicing self-pacing with activity.  Improve shortness of breath with ADL's;Increase knowledge of respiratory medications and ability to use respiratory devices properly.;Develop more efficient breathing techniques such as purse lipped breathing and diaphragmatic breathing and practicing self-pacing with activity.  Improve shortness of breath with ADL's;Increase knowledge of respiratory medications and  ability to use respiratory devices properly.;Develop more efficient breathing techniques such as purse lipped breathing and diaphragmatic breathing and practicing self-pacing with activity.  Improve shortness of breath with ADL's;Increase knowledge of respiratory medications and ability to use respiratory devices properly.;Develop more efficient breathing techniques such as purse lipped breathing and diaphragmatic breathing and practicing self-pacing with activity.  Improve shortness of breath with ADL's;Increase knowledge of respiratory medications and ability to use respiratory devices properly.;Develop more efficient breathing techniques such as purse lipped breathing and diaphragmatic breathing and practicing self-pacing with  activity.   Review  patient is beginning to use plb technique without prompting. she has not had any improvement in her shortness of breath at this point in rehab however she has missed several sessions.  patient is beginning to use plb technique without prompting. she has not had any improvement in her shortness of breath at this point in rehab however she has missed several sessions. will stress with patient importance of consistant attendance in order to improve stamina and strength as she wishes  patients attendace has been much improved over the past 30 days. She is showing improvement in her SOB and utilizing PLB without cueing.  seeing slow improvement in strength and stamina, would benefit from extending to 36 sessions  Continues to slowly improve, walking continues to be difficult for [facility], still unable to walk independently from the parking lot   Expected Outcomes  see admission outcomes  see admission outcomes  see admission outcomes  see admission outcomes  see admission outcomes   Row Name 03/27/17 0905 04/27/17 1102           Core Components/Risk Factors/Patient Goals Review   Personal Goals Review  Improve shortness of breath with ADL's;Increase knowledge of respiratory medications and ability to use respiratory devices properly.;Develop more efficient breathing techniques such as purse lipped breathing and diaphragmatic breathing and practicing self-pacing with activity.  Improve shortness of breath with ADL's;Increase knowledge of respiratory medications and ability to use respiratory devices properly.;Develop more efficient breathing techniques such as purse lipped breathing and diaphragmatic breathing and practicing self-pacing with activity.      Review  Continues to slowly improve, walking continues to be difficult for [place], still unable to walk independently from the parking lot  still continues to slowly progress, requiring 8 L of oxygen for exercise, will graduate in the next  30 days, walking 9 laps on track, level 4 on recumbent bike, level 6 on nustep      Expected Outcomes  see admission outcomes  see admission outcomes         Exercise Goals and Review:   Nutrition & Weight - Outcomes:    Nutrition: Nutrition Therapy & Goals - 01/01/17 1541      Nutrition Therapy   Diet  Therapeutic Lifestyle Changes      Personal Nutrition Goals   Nutrition Goal  Identify food quantities necessary to achieve wt loss of  -2# per week to a goal wt loss of 2.7-10.9 kg (6-24 lb) at graduation from pulmonary rehab.      Intervention Plan   Intervention  Prescribe, educate and counsel regarding individualized specific dietary modifications aiming towards targeted core components such as weight, hypertension, lipid management, diabetes, heart failure and other comorbidities.;Nutrition handout(s) given to patient. Pre-diabetes handout given    Expected Outcomes  Short Term Goal: Understand basic principles of dietary content, such as calories, fat, sodium, cholesterol and nutrients.;Long Term Goal: Adherence to prescribed nutrition plan.  Nutrition Discharge: Nutrition Assessments - 05/22/17 1353      Rate Your Plate Scores   Pre Score  52    Post Score  58       Education Questionnaire Score: Knowledge Questionnaire Score - 05/19/17 0948      Knowledge Questionnaire Score   Pre Score  12/13    Post Score  11/13       Goals reviewed with patient; copy given to patient.

## 2017-05-26 ENCOUNTER — Encounter (HOSPITAL_COMMUNITY)
Admission: RE | Admit: 2017-05-26 | Discharge: 2017-05-26 | Disposition: A | Discharge status: Home / Self Care | Payer: Medicare Other | Source: Ambulatory Visit | Attending: Cardiology | Admitting: Cardiology

## 2017-05-27 ENCOUNTER — Telehealth: Payer: Self-pay | Admitting: Pharmacist Clinician (PhC)/ Clinical Pharmacy Specialist

## 2017-05-27 DIAGNOSIS — F331 Major depressive disorder, recurrent, moderate: Secondary | ICD-10-CM | POA: Diagnosis not present

## 2017-05-27 DIAGNOSIS — G5621 Lesion of ulnar nerve, right upper limb: Secondary | ICD-10-CM | POA: Diagnosis not present

## 2017-05-27 DIAGNOSIS — I422 Other hypertrophic cardiomyopathy: Secondary | ICD-10-CM | POA: Diagnosis not present

## 2017-05-27 DIAGNOSIS — I2721 Secondary pulmonary arterial hypertension: Secondary | ICD-10-CM | POA: Diagnosis not present

## 2017-05-27 DIAGNOSIS — E039 Hypothyroidism, unspecified: Secondary | ICD-10-CM | POA: Diagnosis not present

## 2017-05-27 DIAGNOSIS — Z9981 Dependence on supplemental oxygen: Secondary | ICD-10-CM | POA: Diagnosis not present

## 2017-05-27 NOTE — Telephone Encounter (Signed)
Dr. Zadie Rhine called, concerned that patient needs anti-depressant, but unsure of what might be save given patient's complex medical profile and medication list.    Reviewed med list and possible options for patient.   Had 10 minute discussion with Dr. Zadie Rhine and she will start the patient on bupropion.  She also had concerns about patient's increased oxygen needs in the past several weeks.  Gave her the phone number for [facility] - she can discuss her concerns with Dr. Aundra Dubin.

## 2017-05-28 ENCOUNTER — Encounter (HOSPITAL_COMMUNITY): Payer: Medicare Other

## 2017-05-29 ENCOUNTER — Ambulatory Visit (INDEPENDENT_AMBULATORY_CARE_PROVIDER_SITE_OTHER): Payer: Medicare Other | Admitting: Internal Medicine

## 2017-05-29 ENCOUNTER — Encounter: Payer: Self-pay | Admitting: Internal Medicine

## 2017-05-29 VITALS — BP 102/60 | HR 130 | Ht 63.0 in | Wt 190.2 lb

## 2017-05-29 DIAGNOSIS — J449 Chronic obstructive pulmonary disease, unspecified: Secondary | ICD-10-CM

## 2017-05-29 DIAGNOSIS — J9611 Chronic respiratory failure with hypoxia: Secondary | ICD-10-CM

## 2017-05-29 DIAGNOSIS — I270 Primary pulmonary hypertension: Secondary | ICD-10-CM | POA: Diagnosis not present

## 2017-05-29 NOTE — Progress Notes (Signed)
Subjective:   Patient ID: Amy [name], female    DOB: [birth year], .   MRN: [id number]  Dr Gwenette Greet 05/12/13 final OV:  seen in 2013 where her dyspnea on exertion was felt secondary to minimal obstructive disease, obesity and deconditioning, and also diastolic dysfunction.complaining of progressive dyspnea on exertion x one  year. In the interim, she's been diagnosed with atrial fibrillation, and has been on amiodarone for over a year. She was in the hospital one month prior to [place]   for a mild pneumonia with pleurisy, but feels that she has gotten over this and return to baseline. While in the hospital, she underwent an echocardiogram that showed a normal ejection fraction, but she did have mild MR and moderate left atrial dilatation. Her peak PA pressures were 72 by echo. She has had a left and right cardiac catheterization in 2013 that showed coronary disease, as well as an increased left ventricular end-diastolic pressure consistent with diastolic dysfunction. The patient denies any symptoms of acute bronchospasm. Her weight has increased 9 pounds since the last visit here. rec Discontinue advair.  You have very little airflow obstruction on your most recent breathing studies. Will give you the flu shot today.  Will send a note to Dr. Radford Pax to consider doing a right heart cath to evaluate your pulmonary hypertension. Will check bloodwork today to see if your amiodarone may be contributing to your shortness of breath. Work on weight loss, and stay on cpap compliantly.  Will arrange followup once your results are available.       05/26/2016 1st [place] [facility] office visit/ Melvyn Novas  / former smoker quit in 2011 with GOLD I copd if use fev1/VC ratio / on multaq /xarelto Chief Complaint  Patient presents with  . Pulmonary Consult    Self referral. Pt c/o DOE with "any activity"  for the past several months.    sitting ok RA no sob  Sleeping on cpap s 02 feels rested/ f/u by Dr Radford Pax Has  had to ride the scooter at [facility] x at least a year / uses [facility] parking  Baseline wt = 210  No cough or leg swelling  No better with saba in past  Doe = MMRC3 = can't walk 100 yards even at a slow pace at a flat grade s stopping due to sob   rec Bevespi Take 2 puffs first thing in am and then another 2 puffs about 12 hours later.  Work on inhaler technique:       06/23/2016  f/u ov/Destan Franchini re:  AB/ no copd by pfts prior to [place] Complaint  Patient presents with  . Follow-up    PFT's done today at [facility]. Pt states her breathing is unchanged. She states started having pressure in her chest and then vomiting after eating lunch today.    acute chest pressure, tachycardia about one hour p saba for pfts and then N and V > resolved now but chest tighttness  Now 5/10 where was 7/10 and says ntg doesn't work, just lopressor which she doesn't have with her.  rec Transport to [facility] ER by ambulance for possible unstable angina/ new hypoxemia rec by Dr Fransico Him       05/29/2017  f/u ov/Frenchie Pribyl re:  ? Ab/ no copd by 06/23/16 pfts s/p smoking cessation 2011  Chief Complaint  Patient presents with  . Follow-up    SOB,   Dyspnea: feels like losing ground with acitivity tol and turning 02  as high as 10lpm x one month MMRC3 = can't walk 100 yards even at a slow pace at a flat grade s stopping due to sob  Even on 10 lpm  Cough: no  Sleep: R side down/ 1 pillow 4lpm / cpap machine No worse since ran out of bevespi months ago - some worse since last change in [facility] meds with F/u Dr Aundra Dubin planned  No obvious day to day or daytime variability or assoc excess/ purulent sputum or mucus plugs or hemoptysis or cp or chest tightness, subjective wheeze or overt sinus or hb symptoms. No unusual exposure hx or h/o childhood pna/ asthma or knowledge of premature birth.  Sleeping ok on cpap/ 4lpm  without nocturnal  or early am exacerbation  of respiratory  c/o's or need for noct saba. Also denies any obvious fluctuation of  symptoms with weather or environmental changes or other aggravating or alleviating factors except as outlined above   Current Allergies, Complete Past Medical History, Past Surgical History, Family History, and Social History were reviewed in [employer] record.  ROS  The following are not active complaints unless bolded Hoarseness, sore throat, dysphagia, dental problems, itching, sneezing,  nasal congestion or discharge of excess mucus or purulent secretions, ear ache,   fever, chills, sweats, unintended wt loss or wt gain, classically pleuritic or exertional cp,  orthopnea pnd or leg swelling, presyncope, palpitations, abdominal pain, anorexia, nausea, vomiting, diarrhea  or change in bowel habits or change in bladder habits, change in stools or change in urine, dysuria, hematuria,  rash, arthralgias, visual complaints, headache, numbness, weakness or ataxia or problems with walking or coordination,  change in mood/affect or memory.        Current Meds  Medication Sig  . acetaminophen (TYLENOL) 325 MG tablet Take 2 tablets (650 mg total) by mouth every 4 (four) hours as needed for headache or mild pain.  Marland Kitchen atorvastatin (LIPITOR) 80 MG tablet Take 1 tablet (80 mg total) by mouth daily with lunch.  . Cholecalciferol (VITAMIN D) 2000 UNITS CAPS Take 2,000 Units by mouth daily.   . colchicine 0.6 MG tablet Take 0.6 mg by mouth daily.   . cyclobenzaprine (FLEXERIL) 5 MG tablet Take 1 tablet (5 mg total) by mouth 3 (three) times daily as needed for muscle spasms.  Marland Kitchen ezetimibe (ZETIA) 10 MG tablet TAKE 1 TABLET BY MOUTH DAILY  . furosemide (LASIX) 20 MG tablet Take 1 tablet (20 mg total) by mouth daily.  Marland Kitchen levothyroxine (SYNTHROID, LEVOTHROID) 100 MCG tablet Take 100 mcg by mouth daily before breakfast.  . Macitentan (OPSUMIT) 10 MG TABS Take 1 tablet (10 mg total) by mouth daily.  . metoprolol tartrate (LOPRESSOR) 25 MG tablet Take 1 tablet (25 mg total) by mouth 2 (two) times  daily.  . MULTAQ 400 MG tablet TAKE 1 TABLET BY MOUTH TWICE A DAY WITH A MEAL  . Multiple Vitamin (MULTIVITAMIN) capsule Take 1 capsule by mouth daily.  . pantoprazole (PROTONIX) 40 MG tablet TAKE 1 TABLET BY MOUTH DAILY  . potassium chloride SA (K-DUR,KLOR-CON) 20 MEQ tablet Take 0.5 tablets (10 mEq total) by mouth at bedtime.  . Rivaroxaban (XARELTO) 15 MG TABS tablet Take 1 tablet (15 mg total) by mouth daily with supper.  . Selexipag (UPTRAVI) 200 MCG TABS Take 5 tablets (1,000 mcg total) by mouth 2 (two) times daily. (Patient taking differently: Take 1,000 mcg by mouth 2 (two) times daily. Patient was sent 827m pills so she takes 1  871m pill and 1 2034mpill twice a day)  . traMADol (ULTRAM) 50 MG tablet Take 50 mg by mouth every 8 (eight) hours as needed for moderate pain.  . Marland KitchenNABLE TO FIND Med Name: CPAP  DME[facility]  .                               Objective:   Physical Exam  W/c bound bf nad able to get up on exam table on her own    05/29/2017          06/23/2016        196   05/26/16 199 lb 6.4 oz (90.4 kg)  03/21/16 203 lb 3.2 oz (92.2 kg)  02/13/16 206 lb (93.4 kg)    Vital signs reviewed - Note on arrival 02 sats  90% on  3lpm  And pulse 130 regular on arrival    moderate bilateral non-specific turbinate edema  Watery d/c  Clear lungs/ trace edema L >R increae P2   HEENT: nl dentition, and oropharynx. Nl external ear canals without cough reflex - moderate bilateral non-specific turbinate edema  With watery secretions    NECK :  without JVD/Nodes/TM/ nl carotid upstrokes bilaterally   LUNGS: no acc muscle use,  Nl contour chest which is clear to A and P bilaterally without cough on insp or exp maneuvers   CV:  RRR - apical pulse around 100 at time of exam-   no s3 or murmur   - def  increase in P2 and trace bilateral ankle edema L > R    ABD:  soft and nontender with nl inspiratory excursion in the supine position. No bruits or organomegaly  appreciated, bowel sounds nl  MS:    ext warm without deformities, calf tenderness, cyanosis or clubbing No obvious joint restrictions   SKIN: warm and dry without lesions    NEURO:  alert, approp, nl sensorium with  no motor or cerebellar deficits apparent.                      Assessment & Plan:

## 2017-05-29 NOTE — Patient Instructions (Addendum)
Please see patient coordinator before you leave today  to schedule BEST fit evaluation for portable oxygen and also the right adapter for your cpap machine for 02 and humidity for your 02   Pulmonary follow up is at Dr [facility] discretion > Dr [place] Bells can be consulted if need be

## 2017-05-30 ENCOUNTER — Encounter: Payer: Self-pay | Admitting: Internal Medicine

## 2017-05-30 NOTE — Assessment & Plan Note (Signed)
Worsening resp failure in setting of PAH with clear lungs suggests may be developing R > L shunting  But even if she did  Have PFO the treatment would be directed at the [facility] which Dr Aundra Dubin is already addressing  I did advise her to use humidfied 02 and work with advanced for the right equipment for this and cpap adapter for her 02 and let us know if any issues with 02 rx going forward  Pulmonary f/u can be prn at Dr Claris Gladden discretion/ [place] to see if second opinion needed   I had an extended discussion with the patient reviewing all relevant studies completed to date (extensive inpt and outpt notes reviewed) and  lasting 15 to 20 minutes of a 25 minute visit    Each maintenance medication was reviewed in detail including most importantly the difference between maintenance and prns and under what circumstances the prns are to be triggered using an action plan format that is not reflected in the computer generated alphabetically organized AVS.    Please see AVS for specific instructions unique to this visit that I personally wrote and verbalized to the the pt in detail and then reviewed with pt  by my nurse highlighting any  changes in therapy recommended at today's visit to their plan of care.

## 2017-05-30 NOTE — Assessment & Plan Note (Signed)
-  PFT's 05/2013:  FEV1 1.41 (82%), ratio 77 (ratio 69 if use fev1/vc)  Classic copd curvature/  DLCO 8.85 (41%) corrects to 60% for alv vol  - 05/26/2016  After extensive coaching HFA effectiveness =    75% > bevespi trial  - PFT's  06/23/2016  FEV1 1.56 (95 % ) ratio 83  p 12 % improvement from saba p nothing rior to study with DLCO  22 % corrects to 37  % for alv volume  -  2018  no change on vs off bevespi so pt d/c'd   No evidence at all of airflow limitation contributing to doe or desats > ok to leave off inhalers and f/u here prn

## 2017-05-30 NOTE — Assessment & Plan Note (Signed)
Tachycardia suggests RV output falling > f/u with Cards planned, nothing to offer from this clinic other than to assure keeps sats > 90% at all times (see separate a/p)

## 2017-06-02 ENCOUNTER — Other Ambulatory Visit: Payer: Self-pay

## 2017-06-02 ENCOUNTER — Telehealth (HOSPITAL_COMMUNITY): Payer: Self-pay | Admitting: *Deleted

## 2017-06-02 ENCOUNTER — Encounter (HOSPITAL_COMMUNITY): Payer: Self-pay | Admitting: Cardiology

## 2017-06-02 ENCOUNTER — Encounter (HOSPITAL_COMMUNITY): Payer: Medicare Other

## 2017-06-02 ENCOUNTER — Ambulatory Visit (HOSPITAL_COMMUNITY)
Admission: RE | Admit: 2017-06-02 | Discharge: 2017-06-02 | Disposition: A | Discharge status: Home / Self Care | Payer: Medicare Other | Source: Ambulatory Visit | Attending: Cardiology | Admitting: Cardiology

## 2017-06-02 ENCOUNTER — Encounter (HOSPITAL_COMMUNITY): Payer: Self-pay

## 2017-06-02 ENCOUNTER — Other Ambulatory Visit (HOSPITAL_COMMUNITY): Payer: Self-pay

## 2017-06-02 VITALS — BP 108/52 | HR 63 | Wt 191.0 lb

## 2017-06-02 DIAGNOSIS — Z79899 Other long term (current) drug therapy: Secondary | ICD-10-CM | POA: Diagnosis not present

## 2017-06-02 DIAGNOSIS — I5032 Chronic diastolic (congestive) heart failure: Secondary | ICD-10-CM

## 2017-06-02 DIAGNOSIS — I270 Primary pulmonary hypertension: Secondary | ICD-10-CM | POA: Diagnosis not present

## 2017-06-02 DIAGNOSIS — G4733 Obstructive sleep apnea (adult) (pediatric): Secondary | ICD-10-CM | POA: Diagnosis not present

## 2017-06-02 DIAGNOSIS — J449 Chronic obstructive pulmonary disease, unspecified: Secondary | ICD-10-CM | POA: Insufficient documentation

## 2017-06-02 DIAGNOSIS — I2721 Secondary pulmonary arterial hypertension: Secondary | ICD-10-CM | POA: Insufficient documentation

## 2017-06-02 DIAGNOSIS — E785 Hyperlipidemia, unspecified: Secondary | ICD-10-CM | POA: Insufficient documentation

## 2017-06-02 DIAGNOSIS — I871 Compression of vein: Secondary | ICD-10-CM | POA: Diagnosis not present

## 2017-06-02 DIAGNOSIS — K219 Gastro-esophageal reflux disease without esophagitis: Secondary | ICD-10-CM | POA: Diagnosis not present

## 2017-06-02 DIAGNOSIS — E039 Hypothyroidism, unspecified: Secondary | ICD-10-CM | POA: Diagnosis not present

## 2017-06-02 DIAGNOSIS — Z7901 Long term (current) use of anticoagulants: Secondary | ICD-10-CM | POA: Insufficient documentation

## 2017-06-02 DIAGNOSIS — I422 Other hypertrophic cardiomyopathy: Secondary | ICD-10-CM

## 2017-06-02 DIAGNOSIS — Z87891 Personal history of nicotine dependence: Secondary | ICD-10-CM | POA: Insufficient documentation

## 2017-06-02 DIAGNOSIS — I480 Paroxysmal atrial fibrillation: Secondary | ICD-10-CM

## 2017-06-02 DIAGNOSIS — I2510 Atherosclerotic heart disease of native coronary artery without angina pectoris: Secondary | ICD-10-CM | POA: Diagnosis not present

## 2017-06-02 DIAGNOSIS — I272 Pulmonary hypertension, unspecified: Secondary | ICD-10-CM

## 2017-06-02 DIAGNOSIS — R001 Bradycardia, unspecified: Secondary | ICD-10-CM | POA: Diagnosis not present

## 2017-06-02 DIAGNOSIS — I517 Cardiomegaly: Secondary | ICD-10-CM | POA: Diagnosis not present

## 2017-06-02 LAB — BASIC METABOLIC PANEL
ANION GAP: 10 (ref 5–15)
BUN: 17 mg/dL (ref 6–20)
CHLORIDE: 107 mmol/L (ref 101–111)
CO2: 22 mmol/L (ref 22–32)
Calcium: 8.9 mg/dL (ref 8.9–10.3)
Creatinine, Ser: 1.51 mg/dL — ABNORMAL HIGH (ref 0.44–1.00)
GFR calc non Af Amer: 33 mL/min — ABNORMAL LOW (ref >60)
GFR, EST AFRICAN AMERICAN: 39 mL/min — AB (ref >60)
GLUCOSE: 96 mg/dL (ref 65–99)
Potassium: 3.8 mmol/L (ref 3.5–5.1)
Sodium: 139 mmol/L (ref 135–145)

## 2017-06-02 LAB — BRAIN NATRIURETIC PEPTIDE: B Natriuretic Peptide: 126 pg/mL — ABNORMAL HIGH (ref 0.0–100.0)

## 2017-06-02 NOTE — H&P (View-Only) (Signed)
PCP: Dr. Radene Ou Cardiology: Dr. Radford Pax HF Cardiology: Dr. Aundra Dubin  72 y.o. with history of COPD, OSA on CPAP, apical hypertrophic cardiomyopathy, chronic diastolic CHF, and paroxysmal atrial fibrillation returns for followup of CHF and pulmonary hypertension.  Patient was admitted in 3/18 with CHF and dyspnea, found to have pulmonary arterial hypertension by RHC.  Prior to this, she has had a long history of PAF.  She is currently taking dronedarone but has had breakthrough fibrillation.  She is in NSR today.  She had an atrial fibrillation ablation in the past. She has apical hypertrophic cardiomyopathy proven by cardiac MRI with a consistent ECG.   She is on Opsumit and has started Selexipag, now titrated up to 1000 mcg bid. She was unable to tolerate Adcirca.  She is doing pulmonary rehab. Symptomatically, she has been worse recently despite selective pulmonary vasodilators to treat PAH.  Oxygen requirement has increased to 3L at rest and 8L with exertion.  She is much more short of breath with exertion, dyspneic walking around her house.  No chest pain. No lightheadedness.  No orthopnea/PND.  Weight is up 4 lbs.  She has rare episodes of palpitations, says her HR can get up to 150 bpm with these episodes but they do not last long (suspect atrial fibrillation runs).  6 minute walk today is worsened.   ECG: Personally reviewed.  NSR, LVH with repolarization abnormality (similar to prior).   Labs (2/18): TSH normal Labs (3/18): K 3.8, creatinine 1.26, BNP 424 Labs (5/18): K 4.4, creatinine 1.96, anti-SCL 70 negative, RF very slightly elevated, HIV negative.  Labs (6/18): K 4, creatinine 1.2, LDL 49 Labs (9/18): BNP 149, K 4, creatinine 1.4, BNP 149  4/18 6 minute walk: 109 m  10/18 6 minute walk: 229 m 2/19 6 minute walk: < 100 m  PMH:  1. COPD: Quit smoking 2011.  - PFTs (3/18): minimal obstruction, minimal restriction, DLCO 22% (severely decreased).  2. Hypothyroidism 3. Hyperlipidemia 4.  OSA: Uses CPAP 5. Apical hypertrophic cardiomyopathy:  - Cardiac MRI (7/16) with EF 79%, mid-apical LV hypertrophy with spade-like ventricle, mid-myocardial LGE was noted at the apex.   6. GERD 7. Atrial fibrillation: Paroxysmal.  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 Long QT interval so dofetilide and sotalol have not been used. She has had atrial fibrillation ablation.  - Currently on dronedarone.  8. Coronary artery disease: LHC (7/16) with nonobstructive disease + 85% ostial stenosis small D1.  9. Diastolic CHF: Echo (0/96) with EF 65-70%, apical hypertrophic cardiomyopathy, severe LAE, PASP 86 mmHg.  - RHC (3/18): mean RA 4, PA 69/23 mean 38, mean PCWP 8, CI 2.17, PVR 7.3 WU Fick, 9.6 WU thermo.  10. Pulmonary hypertension: See RHC above.  - V/Q scan negative 3/18 - HIV, SCL70 negative.  RF borderline elevated, doubt significant.  ANA negative.  - Unable to tolerate Adcirca 11. CPX (12/17): peak VO2 8.5, RER 0.96, VE/VCO2 slope 67. Submaximal but appears to show severe functional impairment.    Social History   Socioeconomic History  . Marital status: Widowed    Spouse name: divorced.  . Number of children: 1  . Years of education: Not on file  . Highest education level: Not on file  Social Needs  . Financial resource strain: Not on file  . Food insecurity - worry: Not on file  . Food insecurity - inability: Not on file  . Transportation needs - medical: Not on file  . Transportation needs - non-medical: Not on file  Occupational  History  . Occupation: retired. still works for [employer].   Tobacco Use  . Smoking status: Former Smoker    Packs/day: 0.50    Years: 44.00    Pack years: 22.00    Types: Cigarettes    Last attempt to quit: 04/07/2009    Years since quitting: 8.1  . Smokeless tobacco: Never Used  Substance and Sexual Activity  . Alcohol use: No  . Drug use: No  . Sexual activity: No  Other Topics Concern  . Not on file  Social History Narrative   Pt lives alone  with 2 dogs.    Family History  Problem Relation Age of Onset  . Heart disease Sister        Good Pastures Syndrome  . Gout Father   . Lung cancer Father        smoked  . Breast cancer Paternal Aunt    Review of systems complete and found to be negative unless listed in HPI.    Current Outpatient Medications  Medication Sig Dispense Refill  . acetaminophen (TYLENOL) 325 MG tablet Take 2 tablets (650 mg total) by mouth every 4 (four) hours as needed for headache or mild pain.    . atorvastatin (LIPITOR) 80 MG tablet Take 1 tablet (80 mg total) by mouth daily with lunch. 90 tablet 3  . Cholecalciferol (VITAMIN D) 2000 UNITS CAPS Take 2,000 Units by mouth daily.     . colchicine 0.6 MG tablet Take 0.6 mg by mouth daily.     . cyclobenzaprine (FLEXERIL) 5 MG tablet Take 1 tablet (5 mg total) by mouth 3 (three) times daily as needed for muscle spasms. 10 tablet 0  . ezetimibe (ZETIA) 10 MG tablet TAKE 1 TABLET BY MOUTH DAILY 90 tablet 1  . furosemide (LASIX) 20 MG tablet Take 1 tablet (20 mg total) by mouth daily. 30 tablet 6  . Macitentan (OPSUMIT) 10 MG TABS Take 1 tablet (10 mg total) by mouth daily. 30 tablet 11  . metoprolol tartrate (LOPRESSOR) 25 MG tablet Take 1 tablet (25 mg total) by mouth 2 (two) times daily. 60 tablet 3  . MULTAQ 400 MG tablet TAKE 1 TABLET BY MOUTH TWICE A DAY WITH A MEAL 180 tablet 3  . Multiple Vitamin (MULTIVITAMIN) capsule Take 1 capsule by mouth daily.    . pantoprazole (PROTONIX) 40 MG tablet TAKE 1 TABLET BY MOUTH DAILY 90 tablet 2  . potassium chloride SA (K-DUR,KLOR-CON) 20 MEQ tablet Take 0.5 tablets (10 mEq total) by mouth at bedtime.    . Rivaroxaban (XARELTO) 15 MG TABS tablet Take 1 tablet (15 mg total) by mouth daily with supper. 30 tablet 11  . Selexipag (UPTRAVI) 200 MCG TABS Take 5 tablets (1,000 mcg total) by mouth 2 (two) times daily. 300 tablet 11  . traMADol (ULTRAM) 50 MG tablet Take 50 mg by mouth every 8 (eight) hours as needed for  moderate pain.    . UNABLE TO FIND Med Name: CPAP  [facility]     No current facility-administered medications for this encounter.    BP (!) 108/52   Pulse 63   Wt 191 lb (86.6 kg)   SpO2 93% Comment: on 3L of O2  BMI 33.83 kg/m   General: NAD Neck: No JVD, no thyromegaly or thyroid nodule.  Lungs: Clear to auscultation bilaterally with normal respiratory effort. CV: Nondisplaced PMI.  Heart regular S1/S2, no S3/S4, 1/6 early SEM RUSB.  Trace ankle edema.  No carotid bruit.  Normal   pedal pulses.  Abdomen: Soft, nontender, no hepatosplenomegaly, no distention.  Skin: Intact without lesions or rashes.  Neurologic: Alert and oriented x 3.  Psych: Normal affect. Extremities: No clubbing or cyanosis.  HEENT: Normal.   Assessment/Plan: 1. Chronic diastolic CHF: Echo with EF 65-70%.  There is also likely a component of RV failure from significant pulmonary hypertension.  NYHA class IIIb symptoms now, but she does not appear volume overloaded on exam.    - Continue lasix 20 mg daily and potassium 10 meq daily. BMET and BNP today.  - She will need repeat echo to reassess RV, will arrange.  2. Apical hypertrophic cardiomyopathy: Cardiac MRI in 7/16 showed mid-apical LV hypertrophy with vigorous contraction. Now off metoprolol with slow HR (uses metoprolol prn tachycardia - atrial fibrillation).  ECG is consistent with apical HCM.   3. Atrial fibrillation:  Remains in NSR on Multaq.  Per Dr Rayann Heman, would not re-do her ablation. She has occasional episodes of tachypalpitations likely representing breakthrough atrial fibrillation but episodes are infrequent.  - Continue Xarelto.  No bleeding.   4. Pulmonary hypertension: RHC in 3/18 showed moderate pulmonary arterial hypertension with elevated PVR (7.3 WU by Fick, 9.6 WU by thermo). She has COPD but I do not think this plays a large role in the pulmonary hypertension =>  PFTs in 3/18 showed minimal obstruction and restriction, there was significantly  decreased DLCO, this may be due to pulmonary vascular disease.  OSA may play a role but not a large one. V/Q scan did not suggest chronic PEs in 3/18.  RF borderline elevated (probably not significant), HIV negative, Anti-SCL70 negative, ANA negative.  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 I am concerned that she could have pulmonary veno-occlusive disease based on the response to [facility] treatment thus far.  - I am going to arrange for a high resolution CT chest to see if there are any signs of PVOD.  - Continue Opsumit and Selexipag for now.    - She did not tolerate Adcirca.   - I am going to repeat RHC to look for worsening of PA pressure, reassess PCWP, etc.  We discussed risks/benefits and she agrees to proceed with RHC.  - I will repeat echo as above.  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 Lake Bells in pulmonary division.     [name], MD  06/02/2017

## 2017-06-02 NOTE — Progress Notes (Signed)
PCP: Dr. Radene Ou Cardiology: Dr. Radford Pax HF Cardiology: Dr. Aundra Dubin  72 y.o. with history of COPD, OSA on CPAP, apical hypertrophic cardiomyopathy, chronic diastolic CHF, and paroxysmal atrial fibrillation returns for followup of CHF and pulmonary hypertension.  Patient was admitted in 3/18 with CHF and dyspnea, found to have pulmonary arterial hypertension by RHC.  Prior to this, she has had a long history of PAF.  She is currently taking dronedarone but has had breakthrough fibrillation.  She is in NSR today.  She had an atrial fibrillation ablation in the past. She has apical hypertrophic cardiomyopathy proven by cardiac MRI with a consistent ECG.   She is on Opsumit and has started Selexipag, now titrated up to 1000 mcg bid. She was unable to tolerate Adcirca.  She is doing pulmonary rehab. Symptomatically, she has been worse recently despite selective pulmonary vasodilators to treat PAH.  Oxygen requirement has increased to 3L at rest and 8L with exertion.  She is much more short of breath with exertion, dyspneic walking around her house.  No chest pain. No lightheadedness.  No orthopnea/PND.  Weight is up 4 lbs.  She has rare episodes of palpitations, says her HR can get up to 150 bpm with these episodes but they do not last long (suspect atrial fibrillation runs).  6 minute walk today is worsened.   ECG: Personally reviewed.  NSR, LVH with repolarization abnormality (similar to prior).   Labs (2/18): TSH normal Labs (3/18): K 3.8, creatinine 1.26, BNP 424 Labs (5/18): K 4.4, creatinine 1.96, anti-SCL 70 negative, RF very slightly elevated, HIV negative.  Labs (6/18): K 4, creatinine 1.2, LDL 49 Labs (9/18): BNP 149, K 4, creatinine 1.4, BNP 149  4/18 6 minute walk: 109 m  10/18 6 minute walk: 229 m 2/19 6 minute walk: < 100 m  PMH:  1. COPD: Quit smoking 2011.  - PFTs (3/18): minimal obstruction, minimal restriction, DLCO 22% (severely decreased).  2. Hypothyroidism 3. Hyperlipidemia 4.  OSA: Uses CPAP 5. Apical hypertrophic cardiomyopathy:  - Cardiac MRI (7/16) with EF 79%, mid-apical LV hypertrophy with spade-like ventricle, mid-myocardial LGE was noted at the apex.   6. GERD 7. Atrial fibrillation: Paroxysmal.  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 Long QT interval so dofetilide and sotalol have not been used. She has had atrial fibrillation ablation.  - Currently on dronedarone.  8. Coronary artery disease: LHC (7/16) with nonobstructive disease + 85% ostial stenosis small D1.  9. Diastolic CHF: Echo (0/96) with EF 65-70%, apical hypertrophic cardiomyopathy, severe LAE, PASP 86 mmHg.  - RHC (3/18): mean RA 4, PA 69/23 mean 38, mean PCWP 8, CI 2.17, PVR 7.3 WU Fick, 9.6 WU thermo.  10. Pulmonary hypertension: See RHC above.  - V/Q scan negative 3/18 - HIV, SCL70 negative.  RF borderline elevated, doubt significant.  ANA negative.  - Unable to tolerate Adcirca 11. CPX (12/17): peak VO2 8.5, RER 0.96, VE/VCO2 slope 67. Submaximal but appears to show severe functional impairment.    Social History   Socioeconomic History  . Marital status: Widowed    Spouse name: divorced.  . Number of children: 1  . Years of education: Not on file  . Highest education level: Not on file  Social Needs  . Financial resource strain: Not on file  . Food insecurity - worry: Not on file  . Food insecurity - inability: Not on file  . Transportation needs - medical: Not on file  . Transportation needs - non-medical: Not on file  Occupational  History  . Occupation: retired. still works for [employer].   Tobacco Use  . Smoking status: Former Smoker    Packs/day: 0.50    Years: 44.00    Pack years: 22.00    Types: Cigarettes    Last attempt to quit: 04/07/2009    Years since quitting: 8.1  . Smokeless tobacco: Never Used  Substance and Sexual Activity  . Alcohol use: No  . Drug use: No  . Sexual activity: No  Other Topics Concern  . Not on file  Social History Narrative   Pt lives alone  with 2 dogs.    Family History  Problem Relation Age of Onset  . Heart disease Sister        Good Pastures Syndrome  . Gout Father   . Lung cancer Father        smoked  . Breast cancer Paternal Aunt    Review of systems complete and found to be negative unless listed in HPI.    Current Outpatient Medications  Medication Sig Dispense Refill  . acetaminophen (TYLENOL) 325 MG tablet Take 2 tablets (650 mg total) by mouth every 4 (four) hours as needed for headache or mild pain.    Marland Kitchen atorvastatin (LIPITOR) 80 MG tablet Take 1 tablet (80 mg total) by mouth daily with lunch. 90 tablet 3  . Cholecalciferol (VITAMIN D) 2000 UNITS CAPS Take 2,000 Units by mouth daily.     . colchicine 0.6 MG tablet Take 0.6 mg by mouth daily.     . cyclobenzaprine (FLEXERIL) 5 MG tablet Take 1 tablet (5 mg total) by mouth 3 (three) times daily as needed for muscle spasms. 10 tablet 0  . ezetimibe (ZETIA) 10 MG tablet TAKE 1 TABLET BY MOUTH DAILY 90 tablet 1  . furosemide (LASIX) 20 MG tablet Take 1 tablet (20 mg total) by mouth daily. 30 tablet 6  . Macitentan (OPSUMIT) 10 MG TABS Take 1 tablet (10 mg total) by mouth daily. 30 tablet 11  . metoprolol tartrate (LOPRESSOR) 25 MG tablet Take 1 tablet (25 mg total) by mouth 2 (two) times daily. 60 tablet 3  . MULTAQ 400 MG tablet TAKE 1 TABLET BY MOUTH TWICE A DAY WITH A MEAL 180 tablet 3  . Multiple Vitamin (MULTIVITAMIN) capsule Take 1 capsule by mouth daily.    . pantoprazole (PROTONIX) 40 MG tablet TAKE 1 TABLET BY MOUTH DAILY 90 tablet 2  . potassium chloride SA (K-DUR,KLOR-CON) 20 MEQ tablet Take 0.5 tablets (10 mEq total) by mouth at bedtime.    . Rivaroxaban (XARELTO) 15 MG TABS tablet Take 1 tablet (15 mg total) by mouth daily with supper. 30 tablet 11  . Selexipag (UPTRAVI) 200 MCG TABS Take 5 tablets (1,000 mcg total) by mouth 2 (two) times daily. 300 tablet 11  . traMADol (ULTRAM) 50 MG tablet Take 50 mg by mouth every 8 (eight) hours as needed for  moderate pain.    Marland Kitchen UNABLE TO FIND Med Name: CPAP  DME[facility]     No current facility-administered medications for this encounter.    BP (!) 108/52   Pulse 63   Wt 191 lb (86.6 kg)   SpO2 93% Comment: on 3L of O2  BMI 33.83 kg/m   General: NAD Neck: No JVD, no thyromegaly or thyroid nodule.  Lungs: Clear to auscultation bilaterally with normal respiratory effort. CV: Nondisplaced PMI.  Heart regular S1/S2, no S3/S4, 1/6 early SEM RUSB.  Trace ankle edema.  No carotid bruit.  Normal  pedal pulses.  Abdomen: Soft, nontender, no hepatosplenomegaly, no distention.  Skin: Intact without lesions or rashes.  Neurologic: Alert and oriented x 3.  Psych: Normal affect. Extremities: No clubbing or cyanosis.  HEENT: Normal.   Assessment/Plan: 1. Chronic diastolic CHF: Echo with EF 65-70%.  There is also likely a component of RV failure from significant pulmonary hypertension.  NYHA class IIIb symptoms now, but she does not appear volume overloaded on exam.    - Continue lasix 20 mg daily and potassium 10 meq daily. BMET and BNP today.  - She will need repeat echo to reassess RV, will arrange.  2. Apical hypertrophic cardiomyopathy: Cardiac MRI in 7/16 showed mid-apical LV hypertrophy with vigorous contraction. Now off metoprolol with slow HR (uses metoprolol prn tachycardia - atrial fibrillation).  ECG is consistent with apical HCM.   3. Atrial fibrillation:  Remains in NSR on Multaq.  Per Dr Rayann Heman, would not re-do her ablation. She has occasional episodes of tachypalpitations likely representing breakthrough atrial fibrillation but episodes are infrequent.  - Continue Xarelto.  No bleeding.   4. Pulmonary hypertension: RHC in 3/18 showed moderate pulmonary arterial hypertension with elevated PVR (7.3 WU by Fick, 9.6 WU by thermo). She has COPD but I do not think this plays a large role in the pulmonary hypertension =>  PFTs in 3/18 showed minimal obstruction and restriction, there was significantly  decreased DLCO, this may be due to pulmonary vascular disease.  OSA may play a role but not a large one. V/Q scan did not suggest chronic PEs in 3/18.  RF borderline elevated (probably not significant), HIV negative, Anti-SCL70 negative, ANA negative.  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 I am concerned that she could have pulmonary veno-occlusive disease based on the response to [facility] treatment thus far.  - I am going to arrange for a high resolution CT chest to see if there are any signs of PVOD.  - Continue Opsumit and Selexipag for now.    - She did not tolerate Adcirca.   - I am going to repeat RHC to look for worsening of PA pressure, reassess PCWP, etc.  We discussed risks/benefits and she agrees to proceed with RHC.  - I will repeat echo as above.  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 Lake Bells in pulmonary division.     [name], MD  06/02/2017

## 2017-06-02 NOTE — Patient Instructions (Addendum)
Non-Cardiac CT scanning, (CAT scanning), is a noninvasive, special x-ray that produces cross-sectional images of the body using x-rays and a computer. CT scans help physicians diagnose and treat medical conditions. For some CT exams, a contrast material is used to enhance visibility in the area of the body being studied. CT scans provide greater clarity and reveal more details than regular x-ray exams.  Your physician has requested that you have a cardiac catheterization. Cardiac catheterization is used to diagnose and/or treat various heart conditions. Doctors may recommend this procedure for a number of different reasons. The most common reason is to evaluate chest pain. Chest pain can be a symptom of coronary artery disease (CAD), and cardiac catheterization can show whether plaque is narrowing or blocking your heart's arteries. This procedure is also used to evaluate the valves, as well as measure the blood flow and oxygen levels in different parts of your heart. For further information please visit [facility]. Please follow instruction sheet, as given.  Your physician has requested that you have an echocardiogram. Echocardiography is a painless test that uses sound waves to create images of your heart. It provides your doctor with information about the size and shape of your heart and how well your heart's chambers and valves are working. This procedure takes approximately one hour. There are no restrictions for this procedure.  You have been referred to Pulmonologist Dr. Lake Bells (they will call you)   We did a 6 minute walk test  Labs drawn today (if we do not call you, then your lab work was stable)   Your physician recommends that you schedule a follow-up appointment in: 2 weeks post catheterization with Dr. Aundra Dubin  an a echocardiogram

## 2017-06-02 NOTE — Progress Notes (Signed)
EKG CRITICAL VALUE     12 lead EKG performed.  Critical value noted.  Katrina, RN notified.   Genia Plants, CCT 06/02/2017 10:52 AM

## 2017-06-04 ENCOUNTER — Other Ambulatory Visit (HOSPITAL_COMMUNITY): Payer: Self-pay

## 2017-06-04 ENCOUNTER — Encounter (HOSPITAL_COMMUNITY): Payer: Medicare Other

## 2017-06-04 DIAGNOSIS — I272 Pulmonary hypertension, unspecified: Secondary | ICD-10-CM

## 2017-06-08 ENCOUNTER — Ambulatory Visit (HOSPITAL_COMMUNITY)
Admission: RE | Admit: 2017-06-08 | Discharge: 2017-06-08 | Disposition: A | Discharge status: Home / Self Care | Payer: Medicare Other | Source: Ambulatory Visit | Attending: Cardiology | Admitting: Cardiology

## 2017-06-08 DIAGNOSIS — I2510 Atherosclerotic heart disease of native coronary artery without angina pectoris: Secondary | ICD-10-CM | POA: Diagnosis not present

## 2017-06-08 DIAGNOSIS — R918 Other nonspecific abnormal finding of lung field: Secondary | ICD-10-CM | POA: Diagnosis not present

## 2017-06-08 DIAGNOSIS — J439 Emphysema, unspecified: Secondary | ICD-10-CM | POA: Diagnosis not present

## 2017-06-08 DIAGNOSIS — I700 Atherosclerosis of aorta: Secondary | ICD-10-CM | POA: Diagnosis not present

## 2017-06-08 DIAGNOSIS — I270 Primary pulmonary hypertension: Secondary | ICD-10-CM

## 2017-06-08 DIAGNOSIS — I871 Compression of vein: Secondary | ICD-10-CM | POA: Insufficient documentation

## 2017-06-09 ENCOUNTER — Encounter (HOSPITAL_COMMUNITY): Payer: Self-pay

## 2017-06-09 DIAGNOSIS — I5032 Chronic diastolic (congestive) heart failure: Secondary | ICD-10-CM | POA: Insufficient documentation

## 2017-06-09 DIAGNOSIS — H3530 Unspecified macular degeneration: Secondary | ICD-10-CM | POA: Insufficient documentation

## 2017-06-09 DIAGNOSIS — N183 Chronic kidney disease, stage 3 (moderate): Secondary | ICD-10-CM | POA: Insufficient documentation

## 2017-06-09 DIAGNOSIS — I272 Pulmonary hypertension, unspecified: Secondary | ICD-10-CM | POA: Insufficient documentation

## 2017-06-09 DIAGNOSIS — G4733 Obstructive sleep apnea (adult) (pediatric): Secondary | ICD-10-CM | POA: Insufficient documentation

## 2017-06-09 DIAGNOSIS — I2510 Atherosclerotic heart disease of native coronary artery without angina pectoris: Secondary | ICD-10-CM | POA: Insufficient documentation

## 2017-06-09 DIAGNOSIS — R001 Bradycardia, unspecified: Secondary | ICD-10-CM | POA: Insufficient documentation

## 2017-06-09 DIAGNOSIS — I130 Hypertensive heart and chronic kidney disease with heart failure and stage 1 through stage 4 chronic kidney disease, or unspecified chronic kidney disease: Secondary | ICD-10-CM | POA: Insufficient documentation

## 2017-06-09 DIAGNOSIS — M519 Unspecified thoracic, thoracolumbar and lumbosacral intervertebral disc disorder: Secondary | ICD-10-CM | POA: Insufficient documentation

## 2017-06-09 DIAGNOSIS — E039 Hypothyroidism, unspecified: Secondary | ICD-10-CM | POA: Insufficient documentation

## 2017-06-09 DIAGNOSIS — E785 Hyperlipidemia, unspecified: Secondary | ICD-10-CM | POA: Insufficient documentation

## 2017-06-09 DIAGNOSIS — J449 Chronic obstructive pulmonary disease, unspecified: Secondary | ICD-10-CM | POA: Insufficient documentation

## 2017-06-09 DIAGNOSIS — Z7901 Long term (current) use of anticoagulants: Secondary | ICD-10-CM | POA: Insufficient documentation

## 2017-06-09 DIAGNOSIS — I481 Persistent atrial fibrillation: Secondary | ICD-10-CM | POA: Insufficient documentation

## 2017-06-09 DIAGNOSIS — Z79899 Other long term (current) drug therapy: Secondary | ICD-10-CM | POA: Insufficient documentation

## 2017-06-09 DIAGNOSIS — K219 Gastro-esophageal reflux disease without esophagitis: Secondary | ICD-10-CM | POA: Insufficient documentation

## 2017-06-09 DIAGNOSIS — Z87891 Personal history of nicotine dependence: Secondary | ICD-10-CM | POA: Insufficient documentation

## 2017-06-10 ENCOUNTER — Other Ambulatory Visit (HOSPITAL_COMMUNITY): Payer: Self-pay | Admitting: *Deleted

## 2017-06-10 ENCOUNTER — Ambulatory Visit (HOSPITAL_COMMUNITY)
Admission: RE | Admit: 2017-06-10 | Discharge: 2017-06-10 | Disposition: A | Discharge status: Home / Self Care | Payer: Medicare Other | Source: Ambulatory Visit | Attending: Cardiology | Admitting: Cardiology

## 2017-06-10 ENCOUNTER — Ambulatory Visit (HOSPITAL_BASED_OUTPATIENT_CLINIC_OR_DEPARTMENT_OTHER): Payer: Medicare Other

## 2017-06-10 ENCOUNTER — Encounter (HOSPITAL_COMMUNITY): Payer: Self-pay | Admitting: Cardiology

## 2017-06-10 ENCOUNTER — Encounter (HOSPITAL_COMMUNITY)
Admission: RE | Disposition: A | Discharge status: Home / Self Care | Payer: Self-pay | Source: Ambulatory Visit | Attending: Cardiology

## 2017-06-10 DIAGNOSIS — I5032 Chronic diastolic (congestive) heart failure: Secondary | ICD-10-CM | POA: Insufficient documentation

## 2017-06-10 DIAGNOSIS — E785 Hyperlipidemia, unspecified: Secondary | ICD-10-CM | POA: Insufficient documentation

## 2017-06-10 DIAGNOSIS — Z8249 Family history of ischemic heart disease and other diseases of the circulatory system: Secondary | ICD-10-CM | POA: Diagnosis not present

## 2017-06-10 DIAGNOSIS — Z801 Family history of malignant neoplasm of trachea, bronchus and lung: Secondary | ICD-10-CM | POA: Insufficient documentation

## 2017-06-10 DIAGNOSIS — J449 Chronic obstructive pulmonary disease, unspecified: Secondary | ICD-10-CM | POA: Insufficient documentation

## 2017-06-10 DIAGNOSIS — E039 Hypothyroidism, unspecified: Secondary | ICD-10-CM | POA: Diagnosis not present

## 2017-06-10 DIAGNOSIS — I081 Rheumatic disorders of both mitral and tricuspid valves: Secondary | ICD-10-CM | POA: Diagnosis not present

## 2017-06-10 DIAGNOSIS — Z888 Allergy status to other drugs, medicaments and biological substances status: Secondary | ICD-10-CM | POA: Diagnosis not present

## 2017-06-10 DIAGNOSIS — K219 Gastro-esophageal reflux disease without esophagitis: Secondary | ICD-10-CM | POA: Diagnosis not present

## 2017-06-10 DIAGNOSIS — Z87891 Personal history of nicotine dependence: Secondary | ICD-10-CM | POA: Insufficient documentation

## 2017-06-10 DIAGNOSIS — I422 Other hypertrophic cardiomyopathy: Secondary | ICD-10-CM | POA: Insufficient documentation

## 2017-06-10 DIAGNOSIS — I480 Paroxysmal atrial fibrillation: Secondary | ICD-10-CM | POA: Insufficient documentation

## 2017-06-10 DIAGNOSIS — Z8349 Family history of other endocrine, nutritional and metabolic diseases: Secondary | ICD-10-CM | POA: Insufficient documentation

## 2017-06-10 DIAGNOSIS — Z803 Family history of malignant neoplasm of breast: Secondary | ICD-10-CM | POA: Insufficient documentation

## 2017-06-10 DIAGNOSIS — I2510 Atherosclerotic heart disease of native coronary artery without angina pectoris: Secondary | ICD-10-CM | POA: Insufficient documentation

## 2017-06-10 DIAGNOSIS — Z79899 Other long term (current) drug therapy: Secondary | ICD-10-CM | POA: Insufficient documentation

## 2017-06-10 DIAGNOSIS — I340 Nonrheumatic mitral (valve) insufficiency: Secondary | ICD-10-CM

## 2017-06-10 DIAGNOSIS — I253 Aneurysm of heart: Secondary | ICD-10-CM | POA: Diagnosis not present

## 2017-06-10 DIAGNOSIS — G4733 Obstructive sleep apnea (adult) (pediatric): Secondary | ICD-10-CM | POA: Diagnosis not present

## 2017-06-10 DIAGNOSIS — I2721 Secondary pulmonary arterial hypertension: Secondary | ICD-10-CM | POA: Diagnosis not present

## 2017-06-10 DIAGNOSIS — I272 Pulmonary hypertension, unspecified: Secondary | ICD-10-CM

## 2017-06-10 DIAGNOSIS — Z7901 Long term (current) use of anticoagulants: Secondary | ICD-10-CM | POA: Diagnosis not present

## 2017-06-10 HISTORY — PX: RIGHT HEART CATH: CATH118263

## 2017-06-10 LAB — POCT I-STAT 3, VENOUS BLOOD GAS (G3P V)
Acid-base deficit: 3 mmol/L — ABNORMAL HIGH (ref 0.0–2.0)
Acid-base deficit: 3 mmol/L — ABNORMAL HIGH (ref 0.0–2.0)
BICARBONATE: 22.3 mmol/L (ref 20.0–28.0)
BICARBONATE: 23.1 mmol/L (ref 20.0–28.0)
O2 Saturation: 58 %
O2 Saturation: 60 %
PH VEN: 7.35 (ref 7.250–7.430)
PO2 VEN: 33 mmHg (ref 32.0–45.0)
TCO2: 24 mmol/L (ref 22–32)
TCO2: 23 mmol/L (ref 22–32)
pCO2, Ven: 40.4 mmHg — ABNORMAL LOW (ref 44.0–60.0)
pCO2, Ven: 43.6 mmHg — ABNORMAL LOW (ref 44.0–60.0)
pH, Ven: 7.332 (ref 7.250–7.430)
pO2, Ven: 33 mmHg (ref 32.0–45.0)

## 2017-06-10 LAB — CBC
HCT: 44.6 % (ref 36.0–46.0)
Hemoglobin: 14.4 g/dL (ref 12.0–15.0)
MCH: 28.8 pg (ref 26.0–34.0)
MCHC: 32.3 g/dL (ref 30.0–36.0)
MCV: 89.2 fL (ref 78.0–100.0)
PLATELETS: 271 10*3/uL (ref 150–400)
RBC: 5 MIL/uL (ref 3.87–5.11)
RDW: 17.3 % — ABNORMAL HIGH (ref 11.5–15.5)
WBC: 7.1 10*3/uL (ref 4.0–10.5)

## 2017-06-10 LAB — ECHOCARDIOGRAM COMPLETE
Height: 63 in
Weight: 3056 oz

## 2017-06-10 SURGERY — RIGHT HEART CATH
Anesthesia: LOCAL

## 2017-06-10 MED ORDER — ASPIRIN 81 MG PO CHEW
CHEWABLE_TABLET | ORAL | Status: AC
  Administered 2017-06-10: 81 mg via ORAL
  Filled 2017-06-10: qty 1

## 2017-06-10 MED ORDER — HEPARIN (PORCINE) IN NACL 2-0.9 UNIT/ML-% IJ SOLN
INTRAMUSCULAR | Status: AC | PRN
  Administered 2017-06-10: 500 mL

## 2017-06-10 MED ORDER — PERFLUTREN LIPID MICROSPHERE
1.0000 mL | INTRAVENOUS | Status: DC | PRN
  Administered 2017-06-10: 2 mL via INTRAVENOUS
  Filled 2017-06-10: qty 10

## 2017-06-10 MED ORDER — MIDAZOLAM HCL 2 MG/2ML IJ SOLN
INTRAMUSCULAR | Status: DC | PRN
  Administered 2017-06-10 (×2): 1 mg via INTRAVENOUS

## 2017-06-10 MED ORDER — MIDAZOLAM HCL 2 MG/2ML IJ SOLN
INTRAMUSCULAR | Status: AC
  Filled 2017-06-10: qty 2

## 2017-06-10 MED ORDER — LIDOCAINE HCL (PF) 1 % IJ SOLN
INTRAMUSCULAR | Status: AC
  Filled 2017-06-10: qty 30

## 2017-06-10 MED ORDER — SODIUM CHLORIDE 0.9 % IV SOLN: 250.0000 mL | INTRAVENOUS | Status: DC | PRN

## 2017-06-10 MED ORDER — ONDANSETRON HCL 4 MG/2ML IJ SOLN: 4.0000 mg | Freq: Four times a day (QID) | INTRAMUSCULAR | Status: DC | PRN

## 2017-06-10 MED ORDER — HEPARIN (PORCINE) IN NACL 2-0.9 UNIT/ML-% IJ SOLN
INTRAMUSCULAR | Status: AC
  Filled 2017-06-10: qty 500

## 2017-06-10 MED ORDER — SODIUM CHLORIDE 0.9% FLUSH: 3.0000 mL | INTRAVENOUS | Status: DC | PRN

## 2017-06-10 MED ORDER — SODIUM CHLORIDE 0.9 % IV SOLN
INTRAVENOUS | Status: DC
  Administered 2017-06-10: 09:00:00 via INTRAVENOUS

## 2017-06-10 MED ORDER — SODIUM CHLORIDE 0.9% FLUSH: 3.0000 mL | Freq: Two times a day (BID) | INTRAVENOUS | Status: DC

## 2017-06-10 MED ORDER — FENTANYL CITRATE (PF) 100 MCG/2ML IJ SOLN
INTRAMUSCULAR | Status: AC
  Filled 2017-06-10: qty 2

## 2017-06-10 MED ORDER — LIDOCAINE HCL (PF) 1 % IJ SOLN
INTRAMUSCULAR | Status: DC | PRN
Start: 2017-06-10 — End: 2017-06-10
  Administered 2017-06-10: 15 mL
  Administered 2017-06-10: 2 mL

## 2017-06-10 MED ORDER — FENTANYL CITRATE (PF) 100 MCG/2ML IJ SOLN
INTRAMUSCULAR | Status: DC | PRN
  Administered 2017-06-10 (×2): 25 ug via INTRAVENOUS

## 2017-06-10 MED ORDER — ACETAMINOPHEN 325 MG PO TABS: 650.0000 mg | ORAL_TABLET | ORAL | Status: DC | PRN

## 2017-06-10 MED ORDER — PERFLUTREN LIPID MICROSPHERE
INTRAVENOUS | Status: AC
  Filled 2017-06-10: qty 10

## 2017-06-10 MED ORDER — ASPIRIN 81 MG PO CHEW: 81.0000 mg | CHEWABLE_TABLET | ORAL | Status: DC

## 2017-06-10 MED ORDER — ASPIRIN 81 MG PO CHEW
81.0000 mg | CHEWABLE_TABLET | ORAL | Status: AC
  Administered 2017-06-10: 81 mg via ORAL

## 2017-06-10 SURGICAL SUPPLY — 11 items
CATH BALLN WEDGE 5F 110CM (CATHETERS) ×2 IMPLANT
CATH SWAN GANZ 7F STRAIGHT (CATHETERS) ×2 IMPLANT
GUIDEWIRE .025 260CM (WIRE) ×2 IMPLANT
PACK CARDIAC CATHETERIZATION (CUSTOM PROCEDURE TRAY) ×2 IMPLANT
PROTECTION STATION PRESSURIZED (MISCELLANEOUS) ×2
SHEATH GLIDE SLENDER 4/5FR (SHEATH) ×2 IMPLANT
SHEATH PINNACLE 7F 10CM (SHEATH) ×2 IMPLANT
STATION PROTECTION PRESSURIZED (MISCELLANEOUS) ×1 IMPLANT
TRANSDUCER W/STOPCOCK (MISCELLANEOUS) ×2 IMPLANT
TUBING ART PRESS 72  MALE/FEM (TUBING) ×1
TUBING ART PRESS 72 MALE/FEM (TUBING) ×1 IMPLANT

## 2017-06-10 NOTE — Progress Notes (Signed)
  Echocardiogram 2D Echocardiogram has been performed.  Bobbye Charleston 06/10/2017, 12:48 PM

## 2017-06-10 NOTE — Interval H&P Note (Signed)
History and Physical Interval Note:  06/10/2017 10:42 AM  Amy [name]  has presented today for surgery, with the diagnosis of hp  The various methods of treatment have been discussed with the patient and family. After consideration of risks, benefits and other options for treatment, the patient has consented to  Procedure(s): RIGHT HEART CATH (N/A) as a surgical intervention .  The patient's history has been reviewed, patient examined, no change in status, stable for surgery.  I have reviewed the patient's chart and labs.  Questions were answered to the patient's satisfaction.     [name] [employer]

## 2017-06-10 NOTE — Discharge Instructions (Signed)
venogram, Care After °This sheet gives you information about how to care for yourself after your procedure. Your health care provider may also give you more specific instructions. If you have problems or questions, contact your health care provider. °What can I expect after the procedure? °After the procedure, it is common to have bruising and tenderness at the catheter insertion area. °Follow these instructions at home: °Insertion site care °· Follow instructions from your health care provider about how to take care of your insertion site. Make sure you: °? Wash your hands with soap and water before you change your bandage (dressing). If soap and water are not available, use hand sanitizer. °? Change your dressing as told by your health care provider. °? Leave stitches (sutures), skin glue, or adhesive strips in place. These skin closures may need to stay in place for 2 weeks or longer. If adhesive strip edges start to loosen and curl up, you may trim the loose edges. Do not remove adhesive strips completely unless your health care provider tells you to do that. °· Do not take baths, swim, or use a hot tub until your health care provider approves. °· You may shower 24-48 hours after the procedure or as told by your health care provider. °? Gently wash the site with plain soap and water. °? Pat the area dry with a clean towel. °? Do not rub the site. This may cause bleeding. °· Do not apply powder or lotion to the site. Keep the site clean and dry. °· Check your insertion site every day for signs of infection. Check for: °? Redness, swelling, or pain. °? Fluid or blood. °? Warmth. °? Pus or a bad smell. °Activity °· Rest as told by your health care provider, usually for 1-2 days. °· Do not lift anything that is heavier than 10 lbs. (4.5 kg) or as told by your health care provider. °· Do not drive for 24 hours if you were given a medicine to help you relax (sedative). °· Do not drive or use heavy machinery while  taking prescription pain medicine. °General instructions °· Return to your normal activities as told by your health care provider, usually in about a week. Ask your health care provider what activities are safe for you. °· If the catheter site starts bleeding, lie flat and put pressure on the site. If the bleeding does not stop, get help right away. This is a medical emergency. °· Drink enough fluid to keep your urine clear or pale yellow. This helps flush the contrast dye from your body. °· Take over-the-counter and prescription medicines only as told by your health care provider. °· Keep all follow-up visits as told by your health care provider. This is important. °Contact a health care provider if: °· You have a fever or chills. °· You have redness, swelling, or pain around your insertion site. °· You have fluid or blood coming from your insertion site. °· The insertion site feels warm to the touch. °· You have pus or a bad smell coming from your insertion site. °· You have bruising around the insertion site. °· You notice blood collecting in the tissue around the catheter site (hematoma). The hematoma may be painful to the touch. °Get help right away if: °· You have severe pain at the catheter insertion area. °· The catheter insertion area swells very fast. °· The catheter insertion area is bleeding, and the bleeding does not stop when you hold steady pressure on the area. °·   The area near or just beyond the catheter insertion site becomes pale, cool, tingly, or numb. These symptoms may represent a serious problem that is an emergency. Do not wait to see if the symptoms will go away. Get medical help right away. Call your local emergency services (911 in the [place] Do not drive yourself to the hospital. Summary  After the procedure, it is common to have bruising and tenderness at the catheter insertion area.  After the procedure, it is important to rest and drink plenty of fluids.  Do not take baths,  swim, or use a hot tub until your health care provider says it is okay to do so. You may shower 24-48 hours after the procedure or as told by your health care provider.  If the catheter site starts bleeding, lie flat and put pressure on the site. If the bleeding does not stop, get help right away. This is a medical emergency. This information is not intended to replace advice given to you by your health care provider. Make sure you discuss any questions you have with your health care provider. Document Released: 10/10/2004 Document Revised: 02/27/2016 Document Reviewed: 02/27/2016 Elsevier Interactive Patient Education  [employer].

## 2017-06-11 ENCOUNTER — Encounter (HOSPITAL_COMMUNITY): Payer: Self-pay

## 2017-06-12 ENCOUNTER — Telehealth (HOSPITAL_COMMUNITY): Payer: Self-pay | Admitting: Pharmacist

## 2017-06-12 NOTE — Telephone Encounter (Signed)
Per discussion with Dr. Aundra Dubin, patient to continue uptitration to max tolerated dose of Uptravi. Have called and spoken with [facility] pharmacist who will send titration package with instructions to increase dose by 200 mcg BID q 7 days as tolerated.   Ruta Hinds. Velva Harman, PharmD, BCPS, CPP Clinical Pharmacist Phone: [phone] 06/12/2017 1:43 PM

## 2017-06-16 ENCOUNTER — Encounter (HOSPITAL_COMMUNITY): Payer: Self-pay

## 2017-06-18 ENCOUNTER — Encounter (HOSPITAL_COMMUNITY)
Admission: RE | Admit: 2017-06-18 | Discharge: 2017-06-18 | Disposition: A | Discharge status: Home / Self Care | Payer: Self-pay | Source: Ambulatory Visit | Attending: Cardiology | Admitting: Cardiology

## 2017-06-23 ENCOUNTER — Encounter (HOSPITAL_COMMUNITY): Payer: Self-pay

## 2017-06-25 ENCOUNTER — Encounter (HOSPITAL_COMMUNITY): Payer: Self-pay | Admitting: Cardiology

## 2017-06-25 ENCOUNTER — Other Ambulatory Visit (HOSPITAL_COMMUNITY): Payer: BLUE CROSS/BLUE SHIELD

## 2017-06-25 ENCOUNTER — Ambulatory Visit (HOSPITAL_COMMUNITY): Payer: Medicare Other

## 2017-06-25 ENCOUNTER — Ambulatory Visit (HOSPITAL_COMMUNITY)
Admission: RE | Admit: 2017-06-25 | Discharge: 2017-06-25 | Disposition: A | Discharge status: Home / Self Care | Payer: Medicare Other | Source: Ambulatory Visit | Attending: Cardiology | Admitting: Cardiology

## 2017-06-25 ENCOUNTER — Encounter (HOSPITAL_COMMUNITY): Payer: Self-pay

## 2017-06-25 VITALS — BP 119/50 | HR 71 | Wt 194.0 lb

## 2017-06-25 DIAGNOSIS — K219 Gastro-esophageal reflux disease without esophagitis: Secondary | ICD-10-CM | POA: Diagnosis not present

## 2017-06-25 DIAGNOSIS — I5032 Chronic diastolic (congestive) heart failure: Secondary | ICD-10-CM | POA: Diagnosis not present

## 2017-06-25 DIAGNOSIS — Z7901 Long term (current) use of anticoagulants: Secondary | ICD-10-CM | POA: Diagnosis not present

## 2017-06-25 DIAGNOSIS — I272 Pulmonary hypertension, unspecified: Secondary | ICD-10-CM | POA: Diagnosis not present

## 2017-06-25 DIAGNOSIS — I2510 Atherosclerotic heart disease of native coronary artery without angina pectoris: Secondary | ICD-10-CM | POA: Insufficient documentation

## 2017-06-25 DIAGNOSIS — Z87891 Personal history of nicotine dependence: Secondary | ICD-10-CM | POA: Diagnosis not present

## 2017-06-25 DIAGNOSIS — J449 Chronic obstructive pulmonary disease, unspecified: Secondary | ICD-10-CM | POA: Insufficient documentation

## 2017-06-25 DIAGNOSIS — G4733 Obstructive sleep apnea (adult) (pediatric): Secondary | ICD-10-CM | POA: Diagnosis not present

## 2017-06-25 DIAGNOSIS — I422 Other hypertrophic cardiomyopathy: Secondary | ICD-10-CM | POA: Insufficient documentation

## 2017-06-25 DIAGNOSIS — E039 Hypothyroidism, unspecified: Secondary | ICD-10-CM | POA: Diagnosis not present

## 2017-06-25 DIAGNOSIS — I480 Paroxysmal atrial fibrillation: Secondary | ICD-10-CM | POA: Insufficient documentation

## 2017-06-25 DIAGNOSIS — E785 Hyperlipidemia, unspecified: Secondary | ICD-10-CM | POA: Diagnosis not present

## 2017-06-25 MED ORDER — SELEXIPAG 200 MCG PO TABS
1200.0000 ug | ORAL_TABLET | Freq: Two times a day (BID) | ORAL | 11 refills | Status: DC
Start: 2017-06-25 — End: 2017-08-19

## 2017-06-25 NOTE — Progress Notes (Signed)
Pt completed 6 minute walk test. Pt walked 500 ft (152 meters). O2 sats ranged from 90%-94%, and HR ranged from 86-94. No new orders at this time.

## 2017-06-25 NOTE — Patient Instructions (Signed)
Increase Uptravi to 1200 mcg (6 tabs), twice a day  Your physician recommends that you schedule a follow-up appointment in: 6-8 weeks with Dr. Aundra Dubin

## 2017-06-25 NOTE — Addendum Note (Signed)
Encounter addended by: Shirley Muscat, RN on: 06/25/2017 8:58 AM  Actions taken: Sign clinical note

## 2017-06-25 NOTE — Progress Notes (Addendum)
PCP: Dr. Radene Ou Cardiology: Dr. Radford Pax HF Cardiology: Dr. Aundra Dubin  72 y.o. with history of COPD, OSA on CPAP, apical hypertrophic cardiomyopathy, chronic diastolic CHF, and paroxysmal atrial fibrillation returns for followup of CHF and pulmonary hypertension.  Patient was admitted in 3/18 with CHF and dyspnea, found to have pulmonary arterial hypertension by RHC.  Prior to this, she has had a long history of PAF.  She is currently taking dronedarone but has had breakthrough fibrillation.  She is in NSR today.  She had an atrial fibrillation ablation in the past. She has apical hypertrophic cardiomyopathy proven by cardiac MRI with a consistent ECG.   She is on Opsumit and has started Selexipag, now titrated up to 1000 mcg bid. She was unable to tolerate Adcirca.  She is doing pulmonary rehab. Symptomatically, she has been worse recently despite selective pulmonary vasodilators to treat PAH.  Oxygen requirement has increased to 3L at rest and 8L with exertion.  She is much more short of breath with exertion, dyspneic walking around her house.  No chest pain. No lightheadedness.  No orthopnea/PND.  6 minute walk worsened at last appointment.   Because of these findings, I repeated her RHC in 3/19.  This showed lower PA pressure and normal right and left heart filling pressures, but more worrisomely, cardiac output was low.  However, on repeat of echo in 3/19, the RV actually looked normal in size and systolic function.  Given concern for possible PVOD as cause of her worsening symptoms on treatment, I ordered a high resolution CT chest.  This was degraded by respiratory artifact but showed no definite significant abnormalities.  Today, symptoms are stable, the same as described above. She has had no chest pain or syncope.    Labs (2/18): TSH normal Labs (3/18): K 3.8, creatinine 1.26, BNP 424 Labs (5/18): K 4.4, creatinine 1.96, anti-SCL 70 negative, RF very slightly elevated, HIV negative.  Labs  (6/18): K 4, creatinine 1.2, LDL 49 Labs (9/18): BNP 149, K 4, creatinine 1.4, BNP 149 Labs (2/19): BNP 126, K 3.8, creatinine 1.5  4/18 6 minute walk: 109 m  10/18 6 minute walk: 229 m 2/19 6 minute walk: < 100 m  PMH:  1. COPD: Quit smoking 2011.  - PFTs (3/18): minimal obstruction, minimal restriction, DLCO 22% (severely decreased).  2. Hypothyroidism 3. Hyperlipidemia 4. OSA: Uses CPAP 5. Apical hypertrophic cardiomyopathy:  - Cardiac MRI (7/16) with EF 79%, mid-apical LV hypertrophy with spade-like ventricle, mid-myocardial LGE was noted at the apex.   6. GERD 7. Atrial fibrillation: Paroxysmal.  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 Long QT interval so dofetilide and sotalol have not been used. She has had atrial fibrillation ablation.  - Currently on dronedarone.  8. Coronary artery disease: LHC (7/16) with nonobstructive disease + 85% ostial stenosis small D1.  9. Diastolic CHF: Echo (1/94) with EF 65-70%, apical hypertrophic cardiomyopathy, severe LAE, PASP 86 mmHg.  - RHC (3/18): mean RA 4, PA 69/23 mean 38, mean PCWP 8, CI 2.17, PVR 7.3 WU Fick, 9.6 WU thermo.  - Echo (3/19): EF 65-70%, apical hypertrophic cardiomyopathy, normal RV size and systolic function, severe LAE.  10. Pulmonary hypertension: See RHC above.  - V/Q scan negative 3/18 - HIV, SCL70 negative.  RF borderline elevated, doubt significant.  ANA negative.  - Unable to tolerate Adcirca - RHC (3/19): mean RA 6, PA 49/16 mean 29, mean PCWP 10, CI 1.9/PVR 5.3 WU Fick, CI 1.4/PVR 7.3 Thermo.  - High resolution CT chest (3/19):  respiratory motion artifact present, but suspect mild emphysema with lungs grossly clear.  11. CPX (12/17): peak VO2 8.5, RER 0.96, VE/VCO2 slope 67. Submaximal but appears to show severe functional impairment.    Social History   Socioeconomic History  . Marital status: Widowed    Spouse name: divorced.  . Number of children: 1  . Years of education: Not on file  . Highest education  level: Not on file  Occupational History  . Occupation: retired. still works for [employer].   Social Needs  . Financial resource strain: Not on file  . Food insecurity:    Worry: Not on file    Inability: Not on file  . Transportation needs:    Medical: Not on file    Non-medical: Not on file  Tobacco Use  . Smoking status: Former Smoker    Packs/day: 0.50    Years: 44.00    Pack years: 22.00    Types: Cigarettes    Last attempt to quit: 04/07/2009    Years since quitting: 8.2  . Smokeless tobacco: Never Used  Substance and Sexual Activity  . Alcohol use: No  . Drug use: No  . Sexual activity: Never  Lifestyle  . Physical activity:    Days per week: Not on file    Minutes per session: Not on file  . Stress: Not on file  Relationships  . Social connections:    Talks on phone: Not on file    Gets together: Not on file    Attends religious service: Not on file    Active member of club or organization: Not on file    Attends meetings of clubs or organizations: Not on file    Relationship status: Not on file  . Intimate partner violence:    Fear of current or ex partner: Not on file    Emotionally abused: Not on file    Physically abused: Not on file    Forced sexual activity: Not on file  Other Topics Concern  . Not on file  Social History Narrative   Pt lives alone with 2 dogs.    Family History  Problem Relation Age of Onset  . Heart disease Sister        Good Pastures Syndrome  . Gout Father   . Lung cancer Father        smoked  . Breast cancer Paternal Aunt    Review of systems complete and found to be negative unless listed in HPI.    Current Outpatient Medications  Medication Sig Dispense Refill  . acetaminophen (TYLENOL) 325 MG tablet Take 2 tablets (650 mg total) by mouth every 4 (four) hours as needed for headache or mild pain.    Marland Kitchen atorvastatin (LIPITOR) 80 MG tablet Take 1 tablet (80 mg total) by mouth daily with lunch. 90 tablet 3  . Cholecalciferol  (VITAMIN D) 2000 UNITS CAPS Take 2,000 Units by mouth daily.     . colchicine 0.6 MG tablet Take 0.6 mg by mouth daily.     . cyclobenzaprine (FLEXERIL) 5 MG tablet Take 1 tablet (5 mg total) by mouth 3 (three) times daily as needed for muscle spasms. 10 tablet 0  . ezetimibe (ZETIA) 10 MG tablet TAKE 1 TABLET BY MOUTH DAILY 90 tablet 1  . furosemide (LASIX) 20 MG tablet Take 1 tablet (20 mg total) by mouth daily. 30 tablet 6  . Macitentan (OPSUMIT) 10 MG TABS Take 1 tablet (10 mg total) by mouth daily. [place]  tablet 11  . metoprolol tartrate (LOPRESSOR) 25 MG tablet Take 1 tablet (25 mg total) by mouth 2 (two) times daily. (Patient taking differently: Take 25 mg by mouth 2 (two) times daily as needed (When pt goes into afib). ) 60 tablet 3  . MULTAQ 400 MG tablet TAKE 1 TABLET BY MOUTH TWICE A DAY WITH A MEAL 180 tablet 3  . Multiple Vitamin (MULTIVITAMIN) capsule Take 1 capsule by mouth daily.    . mupirocin ointment (BACTROBAN) 2 % Place 1 application into the nose every other day.    . pantoprazole (PROTONIX) 40 MG tablet TAKE 1 TABLET BY MOUTH DAILY 90 tablet 2  . potassium chloride SA (K-DUR,KLOR-CON) 20 MEQ tablet Take 0.5 tablets (10 mEq total) by mouth at bedtime.    . Rivaroxaban (XARELTO) 15 MG TABS tablet Take 1 tablet (15 mg total) by mouth daily with supper. 30 tablet 11  . Selexipag (UPTRAVI) 200 MCG TABS Take 6 tablets (1,200 mcg total) by mouth 2 (two) times daily. 360 tablet 11  . traMADol (ULTRAM) 50 MG tablet Take 50 mg by mouth every 8 (eight) hours as needed for moderate pain.    Marland Kitchen UNABLE TO FIND Med Name: CPAP  DME-[facility]     No current facility-administered medications for this encounter.    BP (!) 119/50   Pulse 71   Wt 194 lb (88 kg)   SpO2 96% Comment: 5L pulsed  BMI 34.37 kg/m   General: NAD Neck: No JVD, no thyromegaly or thyroid nodule.  Lungs: Clear to auscultation bilaterally with normal respiratory effort. CV: Nondisplaced PMI.  Heart regular S1/S2, no S3/S4,  1/6 early SEM RUSB.  No peripheral edema.  No carotid bruit.  Normal pedal pulses.  Abdomen: Soft, nontender, no hepatosplenomegaly, no distention.  Skin: Intact without lesions or rashes.  Neurologic: Alert and oriented x 3.  Psych: Normal affect. Extremities: No clubbing or cyanosis.  HEENT: Normal.   Assessment/Plan: 1. Chronic diastolic CHF: Echo 7/37 with EF 65-70%, normal RV size and systolic function.  Given worsening symptoms and the finding of low cardiac output on RHC, I had expected her RV to look hypokinetic. However, on 2/19 echo, the RV looked fairly normal. She is not volume overloaded and recent BNP was minimally elevated.  - Continue lasix 20 mg daily and potassium 10 meq daily.  2. Apical hypertrophic cardiomyopathy: Cardiac MRI in 7/16 showed mid-apical LV hypertrophy with vigorous contraction. Now off metoprolol with slow HR (uses metoprolol prn tachycardia - atrial fibrillation).  ECG is consistent with apical HCM.   3. Atrial fibrillation:  Remains in NSR on Multaq.  Per Dr Rayann Heman, would not re-do her ablation. She has occasional episodes of tachypalpitations likely representing breakthrough atrial fibrillation but episodes are infrequent.  - Continue Xarelto.  No bleeding.   4. Pulmonary hypertension: RHC in 3/18 showed moderate pulmonary arterial hypertension with elevated PVR (7.3 WU by Fick, 9.6 WU by thermo). She has COPD but I do not think this plays a large role in the pulmonary hypertension =>  PFTs in 3/18 showed minimal obstruction and restriction, there was significantly decreased DLCO, this may be due to pulmonary vascular disease.  OSA may play a role but not a large one. V/Q scan did not suggest chronic PEs in 3/18.  RF borderline elevated (probably not significant), HIV negative, Anti-SCL70 negative, ANA negative.  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 I have been concerned that she could have pulmonary veno-occlusive disease based on the response to [facility] treatment thus far.  Her RHC in 3/19 showed lower PA pressure and PVR with normal right and left heart filling pressures, but cardiac output was lower than prior.  Surprisingly, her RV looked normal on echo so I question how accurate the cardiac output measurement really was.  I did a high resolution CT to look for evidence of ILD, but it seems to have been degraded by respiratory artifact.  No severe abnormality was detected on the study.   - Continue Opsumit.     - She did not tolerate Adcirca.   - With no definite evidence for PVOD, I will continue to titrate up selexipag, I will increase to 1200 mcg bid today.   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 Lake Bells in pulmonary division.  I am still not clear about what has caused her worsening symptoms.   [name], MD  06/25/2017

## 2017-06-26 NOTE — Addendum Note (Signed)
Encounter addended by: Larey Dresser, MD on: 06/26/2017 12:13 AM  Actions taken: Sign clinical note

## 2017-06-29 ENCOUNTER — Other Ambulatory Visit (HOSPITAL_COMMUNITY): Payer: Self-pay | Admitting: *Deleted

## 2017-06-29 MED ORDER — MACITENTAN 10 MG PO TABS: 10.0000 mg | ORAL_TABLET | Freq: Every day | ORAL | 11 refills | Status: DC

## 2017-06-30 ENCOUNTER — Encounter (HOSPITAL_COMMUNITY): Payer: Self-pay

## 2017-07-01 DIAGNOSIS — F0631 Mood disorder due to known physiological condition with depressive features: Secondary | ICD-10-CM | POA: Diagnosis not present

## 2017-07-02 ENCOUNTER — Encounter (HOSPITAL_COMMUNITY): Payer: Self-pay

## 2017-07-02 ENCOUNTER — Encounter (HOSPITAL_COMMUNITY): Payer: Self-pay | Admitting: *Deleted

## 2017-07-02 NOTE — Progress Notes (Signed)
Pulmonary Rehab I spoke with [facility] today. She has decided to drop from the Pulmonary Rehab Maintenance program. She feels that she needs to work through her medical issues and appointments before committing to come her to exercise regularly. She plans to continue to work out at the [employer]. I have encouraged her to contact us at any time if we can be of any assistance to her.

## 2017-07-03 ENCOUNTER — Other Ambulatory Visit: Payer: Medicare Other

## 2017-07-03 ENCOUNTER — Encounter: Payer: Self-pay | Admitting: Pulmonary Disease

## 2017-07-03 ENCOUNTER — Ambulatory Visit (INDEPENDENT_AMBULATORY_CARE_PROVIDER_SITE_OTHER): Payer: Medicare Other | Admitting: Pulmonary Disease

## 2017-07-03 VITALS — BP 126/64 | HR 65 | Ht 63.0 in | Wt 192.0 lb

## 2017-07-03 DIAGNOSIS — J9611 Chronic respiratory failure with hypoxia: Secondary | ICD-10-CM | POA: Diagnosis not present

## 2017-07-03 DIAGNOSIS — I270 Primary pulmonary hypertension: Secondary | ICD-10-CM | POA: Diagnosis not present

## 2017-07-03 DIAGNOSIS — I2510 Atherosclerotic heart disease of native coronary artery without angina pectoris: Secondary | ICD-10-CM

## 2017-07-03 MED ORDER — IPRATROPIUM-ALBUTEROL 0.5-2.5 (3) MG/3ML IN SOLN: 3.0000 mL | Freq: Three times a day (TID) | RESPIRATORY_TRACT | 5 refills | Status: DC

## 2017-07-03 NOTE — Patient Instructions (Signed)
Pulmonary hypertension: Continue your pulmonary vasodilators and diuretics as directed by the cardiology clinic Weigh yourself daily Monitor sodium input and try to take less than 2 g a day  Chronic respiratory failure with hypoxemia: Continue 4 L of oxygen at rest, 8-10 with exercise We will add an Oxymizer We will add a humidifier for oxygen to reduce sinus congestion  Small airways disease/emphysema: Start using DuoNeb 3 times a day  OSA: Use CPAP nightly  In general, I wonder if we need to have an open lung biopsy, I will discuss this with your other doctors.  Follow up here 4-6 weeks

## 2017-07-03 NOTE — Progress Notes (Signed)
Synopsis: Patient of Dr. 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  She also has diastolic heart failure.    Subjective:   PATIENT ID: Amy [name] GENDER: female DOB: 06-Nov-1945, MRN: [id number]   HPI  Chief Complaint  Patient presents with  . Follow-up    MW pt referred for a second opinion for pulm htn.  Pt also sees Dr. Aundra Dubin.    Amy [name] comes to clinic today for a second opinion on pulmonary hypertension in the setting of a prior history of tobacco use.  She also has a past medical history significant for atrial fibrillation and underwent a pulmonary vein ablation in 2017.  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  She has not been on amiodarone since then.  She is also been diagnosed with diastolic heart failure, obstructive sleep apnea, and possible COPD.  She tells me that 1 year ago she started to have increasing shortness of breath and came to our clinic where she was profoundly hypoxemic and hypotensive and was admitted to the hospital for further evaluation.  At that point she was diagnosed with pulmonary hypertension.  She has been treated with endothelium receptor antagonist as well as prostacyclin's.  Over the last year she has been dissipating in pulmonary rehab on a regular basis, using her oxygen every day, but despite this she has been becoming increasing short of breath and her oxygen requirements have been increasing.  She had a repeat right heart catheterization performed by Dr. Algernon Huxley which showed improved pulmonary arterial pressures and improved pulmonary capillary wedge pressure.  However, despite this her oxygen needs are worsening.  A repeat CT scan of her chest this year showed significant air trapping.  She tells me that she does not have much trouble with cough or wheeze.  She tells me that she has a past medical history of past family history  suggestive of Goodpasture syndrome.  Smoked 1-2 cigarettes a day from age 52 until 2012 with Chantix No lung problems as a child She first started to have symptoms at age 69 when she had thyroid problems, this was treated with radioactive iodine in the 1990's.   She went on oxygen one year ago after she had been struggling with dyspnea for a number of years.  Her symptoms would be intermittent.  She would have severe symptoms intermittently.  She was hospitalized in March 2018 for severe hypoxemia.  Prior to 06/2016 she would have dyspnea, but didn't have a lot of cough, wheeze or frequent bronchitis.  She had pneumonia in 2014 but this is her only significant repsiratory infection.  She may have needed antibiotics for bornchitis prior to this 1-2 times.   Her sister had Lupus. She also had Googpasture's syndrome.    She had clinical concern for amiodarone lung toxicity once, the thinks this was in 2014.   Cardiac ablation performed in 2017. Afib has bothered her quite a bit.  Past Medical History:  Diagnosis Date  . Apical variant hypertrophic cardiomyopathy ([place])    Diagnosed by echo and ECG 11/13  . Atypical atrial flutter ([place])   . CAD (coronary artery disease)    a. LHC in 10/2011 demonstrated non-obstructive disease and an 80% lesion in a small D1 treated medically.   . CHF (congestive heart failure) ([place])   . Chronic diastolic heart failure ([place])   . CKD (chronic kidney disease), stage III ([place])   .  COPD (chronic obstructive pulmonary disease) ([place])   . Dizziness    had PT for being off balance - in approx. 2012  . GERD (gastroesophageal reflux disease)   . Hyperlipidemia   . Hypertension   . Hypothyroid   . Lumbar disc disease   . Macular degeneration   . OSA (obstructive sleep apnea)   . Persistent atrial fibrillation ([place])       . Pulmonary hypertension ([place])    a. [place] 05/2013 showed mild PAH with normal PCWP and RA pressure, could be related to OSA and low oxygen  saturation.   . Refusal of blood transfusions as patient is Jehovah's Witness   . Sinus bradycardia      Family History  Problem Relation Age of Onset  . Heart disease Sister        Good Pastures Syndrome  . Gout Father   . Lung cancer Father        smoked  . Breast cancer Paternal Aunt      Social History   Socioeconomic History  . Marital status: Widowed    Spouse name: divorced.  . Number of children: 1  . Years of education: Not on file  . Highest education level: Not on file  Occupational History  . Occupation: retired. still works for [employer].   Social Needs  . Financial resource strain: Not on file  . Food insecurity:    Worry: Not on file    Inability: Not on file  . Transportation needs:    Medical: Not on file    Non-medical: Not on file  Tobacco Use  . Smoking status: Former Smoker    Packs/day: 0.50    Years: 44.00    Pack years: 22.00    Types: Cigarettes    Last attempt to quit: 04/07/2009    Years since quitting: 8.2  . Smokeless tobacco: Never Used  Substance and Sexual Activity  . Alcohol use: No  . Drug use: No  . Sexual activity: Never  Lifestyle  . Physical activity:    Days per week: Not on file    Minutes per session: Not on file  . Stress: Not on file  Relationships  . Social connections:    Talks on phone: Not on file    Gets together: Not on file    Attends religious service: Not on file    Active member of club or organization: Not on file    Attends meetings of clubs or organizations: Not on file    Relationship status: Not on file  . Intimate partner violence:    Fear of current or ex partner: Not on file    Emotionally abused: Not on file    Physically abused: Not on file    Forced sexual activity: Not on file  Other Topics Concern  . Not on file  Social History Narrative   Pt lives alone with 2 dogs.      Allergies  Allergen Reactions  . Amiodarone     Dyspnea - felt to be 2/2 amio lung toxicity Has tolerated  Omnipaque   . Zyban [Bupropion] Itching     Outpatient Medications Prior to Visit  Medication Sig Dispense Refill  . acetaminophen (TYLENOL) 325 MG tablet Take 2 tablets (650 mg total) by mouth every 4 (four) hours as needed for headache or mild pain.    Marland Kitchen atorvastatin (LIPITOR) 80 MG tablet Take 1 tablet (80 mg total) by mouth daily with lunch. 90 tablet 3  .  Cholecalciferol (VITAMIN D) 2000 UNITS CAPS Take 2,000 Units by mouth daily.     . colchicine 0.6 MG tablet Take 0.6 mg by mouth daily.     . cyclobenzaprine (FLEXERIL) 5 MG tablet Take 1 tablet (5 mg total) by mouth 3 (three) times daily as needed for muscle spasms. 10 tablet 0  . ezetimibe (ZETIA) 10 MG tablet TAKE 1 TABLET BY MOUTH DAILY 90 tablet 1  . furosemide (LASIX) 20 MG tablet Take 1 tablet (20 mg total) by mouth daily. 30 tablet 6  . Macitentan (OPSUMIT) 10 MG TABS Take 1 tablet (10 mg total) by mouth daily. 30 tablet 11  . metoprolol tartrate (LOPRESSOR) 25 MG tablet Take 1 tablet (25 mg total) by mouth 2 (two) times daily. (Patient taking differently: Take 25 mg by mouth 2 (two) times daily as needed (When pt goes into afib). ) 60 tablet 3  . MULTAQ 400 MG tablet TAKE 1 TABLET BY MOUTH TWICE A DAY WITH A MEAL 180 tablet 3  . Multiple Vitamin (MULTIVITAMIN) capsule Take 1 capsule by mouth daily.    . mupirocin ointment (BACTROBAN) 2 % Place 1 application into the nose every other day.    . pantoprazole (PROTONIX) 40 MG tablet TAKE 1 TABLET BY MOUTH DAILY 90 tablet 2  . potassium chloride SA (K-DUR,KLOR-CON) 20 MEQ tablet Take 0.5 tablets (10 mEq total) by mouth at bedtime.    . Rivaroxaban (XARELTO) 15 MG TABS tablet Take 1 tablet (15 mg total) by mouth daily with supper. 30 tablet 11  . Selexipag (UPTRAVI) 200 MCG TABS Take 6 tablets (1,200 mcg total) by mouth 2 (two) times daily. (Patient taking differently: Take 1,400 mcg by mouth 2 (two) times daily. ) 360 tablet 11  . traMADol (ULTRAM) 50 MG tablet Take 50 mg by mouth  every 8 (eight) hours as needed for moderate pain.    Marland Kitchen UNABLE TO FIND Med Name: CPAP  [facility]     No facility-administered medications prior to visit.     Review of Systems  Constitutional: Positive for malaise/fatigue. Negative for chills, fever and weight loss.  HENT: Negative for congestion, nosebleeds, sinus pain and sore throat.   Eyes: Negative for photophobia, pain and discharge.  Respiratory: Positive for cough and shortness of breath. Negative for hemoptysis, sputum production and wheezing.   Cardiovascular: Positive for leg swelling. Negative for chest pain, palpitations and orthopnea.  Gastrointestinal: Negative for abdominal pain, constipation, diarrhea, nausea and vomiting.  Genitourinary: Negative for dysuria, frequency, hematuria and urgency.  Musculoskeletal: Negative for back pain, joint pain, myalgias and neck pain.  Skin: Negative for itching and rash.  Neurological: Negative for tingling, tremors, sensory change, speech change, focal weakness, seizures, weakness and headaches.  Psychiatric/Behavioral: Negative for memory loss, substance abuse and suicidal ideas. The patient is not nervous/anxious.       Objective:  Physical Exam   Vitals:   07/03/17 1011  BP: 126/64  Pulse: 65  SpO2: 90%  Weight: 192 lb (87.1 kg)  Height: 5' 3" (1.6 m)   4L [place]  Gen: well appearing, no acute distress HENT: NCAT, OP clear, neck supple without masses Eyes: PERRL, EOMi Lymph: no cervical lymphadenopathy PULM: CTA B CV: RRR, no mgr, no JVD GI: BS+, soft, nontender, no hsm Derm: no rash or skin breakdown MSK: normal bulk and tone Neuro: A&Ox4, CN II-XII intact, strength 5/5 in all 4 extremities Psyche: normal mood and affect   CBC    Component Value Date/Time  WBC 7.1 06/10/2017 0815   RBC 5.00 06/10/2017 0815   HGB 14.4 06/10/2017 0815   HCT 44.6 06/10/2017 0815   PLT 271 06/10/2017 0815   MCV 89.2 06/10/2017 0815   MCH 28.8 06/10/2017 0815   MCHC 32.3  06/10/2017 0815   RDW 17.3 (H) 06/10/2017 0815   LYMPHSABS 1.5 05/26/2016 1711   MONOABS 0.8 05/26/2016 1711   EOSABS 0.2 05/26/2016 1711   BASOSABS 0.1 05/26/2016 1711     Chest imaging: - V/Q scan negative 3/22 June 2017 high-resolution CT scan of the chest images independently reviewed showing patchy groundglass throughout with air trapping but no clear evidence of underlying interstitial lung disease.  Some evidence of emphysema seen, motion degraded images  PFT: March 2018 ratio 83%, FVC 1.88 L 89% predicted, significant change with bronchodilator in the small airways, total lung capacity 3.63 L 76% predicted, DLCO 4.75 mL 22% predicted   Labs: - HIV, SCL70 negative.  RF borderline elevated, doubt significant.  ANA negative.   Path:  Echo: Echo (3/18) with EF 65-70%, apical hypertrophic cardiomyopathy, severe LAE, PASP 86 mmHg.  Echo (3/19): EF 65-70%, apical hypertrophic cardiomyopathy, normal RV size and systolic function, severe LAE.   Heart Catheterization: - RHC (3/18): mean RA 4, PA 69/23 mean 38, mean PCWP 8, CI 2.17, PVR 7.3 WU Fick, 9.6 WU thermo.  - RHC (3/19): mean RA 6, PA 49/16 mean 29, mean PCWP 10, CI 1.9/PVR 5.3 WU Fick, CI 1.4/PVR 7.3 Thermo.   6 Min walk: 4/18 6 minute walk: 109 m  10/18 6 minute walk: 229 m 2/19 6 minute walk: < 100 m  Records from her visit with cardiology recently evaluated where she was maintained on her current regimen of pulmonary vasodilators and diuretics and oxygen after the most recent right heart catheterization.  Records from her February 2019 visit with [name] here reviewed where she was advised to stop taking bronchodilators.      Assessment & Plan:   No diagnosis found.  Discussion: This is a very pleasant 72 year old female who has a complex problem which is predominantly due to worsening hypoxemia in the last 12 months.  This problem has been worsening despite the addition of diuretic regimen, pulmonary vasodilators,  and strict adherence to a pulmonary rehab regimen.  It is really difficult to know it is going on here.  I worry about a pulmonary parenchymal problem because of the air trapping that was seen on lung function testing.  Some lung function test in the past showed evidence of airflow obstruction, most recently she had significant small airways obstruction.  So I think there is some degree of airways disease contributing.  I wonder if whether or not there is some residual abnormality from the amiodarone toxicity she experienced years ago.  That all being said, the predominant problem on the CT scan of the chest is mosaicism which can be due to either pulmonary vascular problems or small airways problems.  I think it safest to treat both so I am going to add back bronchodilators with DuoNeb throughout the day.  She also needs to be more compliant with her obstructive sleep apnea.  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  I wonder whether or not prior pulmonary vein ablation may be related to this is the timing of her worsening symptoms seems to be somehow related.  I also worry about the possibility of pulmonary capillary hemangiomatosis.  I think the best approach moving forward is to start  her on a better sinus regimen so that she will be more compliant with CPAP, add bronchodilators, and consider an open lung biopsy for definitive diagnosis.  Plan: Pulmonary hypertension: Continue your pulmonary vasodilators and diuretics as directed by the cardiology clinic Weigh yourself daily Monitor sodium input and try to take less than 2 g a day  Family history of significant autoimmune disease: I will check labwork to look for Goodpasteur's syndrome and small vessel vasculitis  Chronic respiratory failure with hypoxemia: Continue 4 L of oxygen at rest, 8-10 with exercise We will add an Oxymizer We will add a humidifier for oxygen to reduce sinus congestion  Small airways  disease/emphysema: Start using DuoNeb 3 times a day  OSA: Use CPAP nightly  In general, I wonder if we need to have an open lung biopsy, I will discuss this with your other doctors.  Follow up here 4-6 weeks  > 50% of this 40 minute visit spent face to face   Current Outpatient Medications:  .  acetaminophen (TYLENOL) 325 MG tablet, Take 2 tablets (650 mg total) by mouth every 4 (four) hours as needed for headache or mild pain., Disp: , Rfl:  .  atorvastatin (LIPITOR) 80 MG tablet, Take 1 tablet (80 mg total) by mouth daily with lunch., Disp: 90 tablet, Rfl: 3 .  Cholecalciferol (VITAMIN D) 2000 UNITS CAPS, Take 2,000 Units by mouth daily. , Disp: , Rfl:  .  colchicine 0.6 MG tablet, Take 0.6 mg by mouth daily. , Disp: , Rfl:  .  cyclobenzaprine (FLEXERIL) 5 MG tablet, Take 1 tablet (5 mg total) by mouth 3 (three) times daily as needed for muscle spasms., Disp: 10 tablet, Rfl: 0 .  ezetimibe (ZETIA) 10 MG tablet, TAKE 1 TABLET BY MOUTH DAILY, Disp: 90 tablet, Rfl: 1 .  furosemide (LASIX) 20 MG tablet, Take 1 tablet (20 mg total) by mouth daily., Disp: 30 tablet, Rfl: 6 .  Macitentan (OPSUMIT) 10 MG TABS, Take 1 tablet (10 mg total) by mouth daily., Disp: 30 tablet, Rfl: 11 .  metoprolol tartrate (LOPRESSOR) 25 MG tablet, Take 1 tablet (25 mg total) by mouth 2 (two) times daily. (Patient taking differently: Take 25 mg by mouth 2 (two) times daily as needed (When pt goes into afib). ), Disp: 60 tablet, Rfl: 3 .  MULTAQ 400 MG tablet, TAKE 1 TABLET BY MOUTH TWICE A DAY WITH A MEAL, Disp: 180 tablet, Rfl: 3 .  Multiple Vitamin (MULTIVITAMIN) capsule, Take 1 capsule by mouth daily., Disp: , Rfl:  .  mupirocin ointment (BACTROBAN) 2 %, Place 1 application into the nose every other day., Disp: , Rfl:  .  pantoprazole (PROTONIX) 40 MG tablet, TAKE 1 TABLET BY MOUTH DAILY, Disp: 90 tablet, Rfl: 2 .  potassium chloride SA (K-DUR,KLOR-CON) 20 MEQ tablet, Take 0.5 tablets (10 mEq total) by mouth at  bedtime., Disp: , Rfl:  .  Rivaroxaban (XARELTO) 15 MG TABS tablet, Take 1 tablet (15 mg total) by mouth daily with supper., Disp: 30 tablet, Rfl: 11 .  Selexipag (UPTRAVI) 200 MCG TABS, Take 6 tablets (1,200 mcg total) by mouth 2 (two) times daily. (Patient taking differently: Take 1,400 mcg by mouth 2 (two) times daily. ), Disp: 360 tablet, Rfl: 11 .  traMADol (ULTRAM) 50 MG tablet, Take 50 mg by mouth every 8 (eight) hours as needed for moderate pain., Disp: , Rfl:  .  UNABLE TO FIND, Med Name: CPAP  [facility], Disp: , Rfl:

## 2017-07-06 LAB — ANCA SCREEN W REFLEX TITER
ANCA Screen: POSITIVE — AB
P-ANCA Titer: 1:40 {titer} — ABNORMAL HIGH

## 2017-07-06 LAB — GLOMERULAR BASEMENT MEMBRANE ANTIBODIES: GBM Ab: <1 AI

## 2017-07-07 ENCOUNTER — Encounter (HOSPITAL_COMMUNITY): Payer: Self-pay

## 2017-07-07 ENCOUNTER — Other Ambulatory Visit: Payer: Self-pay

## 2017-07-07 DIAGNOSIS — M359 Systemic involvement of connective tissue, unspecified: Secondary | ICD-10-CM

## 2017-07-08 ENCOUNTER — Telehealth: Payer: Self-pay | Admitting: Pulmonary Disease

## 2017-07-08 DIAGNOSIS — E039 Hypothyroidism, unspecified: Secondary | ICD-10-CM | POA: Diagnosis not present

## 2017-07-08 DIAGNOSIS — F0631 Mood disorder due to known physiological condition with depressive features: Secondary | ICD-10-CM | POA: Diagnosis not present

## 2017-07-08 NOTE — Telephone Encounter (Signed)
Called and spoke with Tiffinae advised her that I would be faxing notes over. Verified fax number. Nothing further needed.

## 2017-07-09 ENCOUNTER — Telehealth: Payer: Self-pay | Admitting: Pulmonary Disease

## 2017-07-09 ENCOUNTER — Encounter (HOSPITAL_COMMUNITY): Payer: Self-pay

## 2017-07-09 NOTE — Telephone Encounter (Signed)
Amy [name] is calling back [phone]  [phone]

## 2017-07-09 NOTE — Telephone Encounter (Signed)
LVM for Amy [name] with [facility] returning her call regarding labs needed for pt. X1

## 2017-07-09 NOTE — Telephone Encounter (Signed)
LVM for [name] with [facility] to return call regarding pt's labs X2

## 2017-07-10 NOTE — Telephone Encounter (Signed)
Fredirick Lathe Physician, returning call about labs. Cb is [phone]. Per Tiffany, call the front desk and ask for her.

## 2017-07-10 NOTE — Telephone Encounter (Signed)
Labs have been faxed. Nothing further was needed.

## 2017-07-14 ENCOUNTER — Encounter (HOSPITAL_COMMUNITY): Payer: Self-pay

## 2017-07-15 DIAGNOSIS — F0631 Mood disorder due to known physiological condition with depressive features: Secondary | ICD-10-CM | POA: Diagnosis not present

## 2017-07-16 ENCOUNTER — Encounter (HOSPITAL_COMMUNITY): Payer: Self-pay

## 2017-07-17 ENCOUNTER — Other Ambulatory Visit: Payer: Self-pay | Admitting: Family Medicine

## 2017-07-17 DIAGNOSIS — Z1231 Encounter for screening mammogram for malignant neoplasm of breast: Secondary | ICD-10-CM

## 2017-07-20 ENCOUNTER — Telehealth: Payer: Self-pay | Admitting: Pulmonary Disease

## 2017-07-20 NOTE — Telephone Encounter (Signed)
Spoke with pt. States that she is having issues with taking Duoneb. Pt states that the medication is making her shaky and is taking to long to get through one treatment. After talking with the pt for a while it came out that she has been taking 3 vials (54m) TID instead of 1 vial (352m TID. Advised pt of the correct dosing and how to use to her medication and to let [place] if she continues to have issues. Nothing further was needed at this time.

## 2017-07-21 ENCOUNTER — Encounter (HOSPITAL_COMMUNITY): Payer: Self-pay

## 2017-07-22 DIAGNOSIS — F0631 Mood disorder due to known physiological condition with depressive features: Secondary | ICD-10-CM | POA: Diagnosis not present

## 2017-07-22 DIAGNOSIS — R609 Edema, unspecified: Secondary | ICD-10-CM | POA: Diagnosis not present

## 2017-07-23 ENCOUNTER — Other Ambulatory Visit: Payer: Self-pay | Admitting: Cardiology

## 2017-07-23 ENCOUNTER — Encounter (HOSPITAL_COMMUNITY): Payer: Self-pay

## 2017-07-23 NOTE — Telephone Encounter (Signed)
This is a CHF pt.

## 2017-07-28 ENCOUNTER — Encounter (HOSPITAL_COMMUNITY): Payer: Self-pay

## 2017-07-30 ENCOUNTER — Encounter (HOSPITAL_COMMUNITY): Payer: Self-pay

## 2017-08-03 DIAGNOSIS — H40013 Open angle with borderline findings, low risk, bilateral: Secondary | ICD-10-CM | POA: Diagnosis not present

## 2017-08-04 ENCOUNTER — Encounter (HOSPITAL_COMMUNITY): Payer: Self-pay

## 2017-08-05 ENCOUNTER — Other Ambulatory Visit (HOSPITAL_COMMUNITY): Payer: Self-pay | Admitting: *Deleted

## 2017-08-05 MED ORDER — PANTOPRAZOLE SODIUM 40 MG PO TBEC: 40.0000 mg | DELAYED_RELEASE_TABLET | Freq: Every day | ORAL | 2 refills | Status: DC

## 2017-08-06 ENCOUNTER — Encounter (HOSPITAL_COMMUNITY): Payer: Self-pay

## 2017-08-06 DIAGNOSIS — E669 Obesity, unspecified: Secondary | ICD-10-CM | POA: Diagnosis not present

## 2017-08-06 DIAGNOSIS — Z6834 Body mass index (BMI) 34.0-34.9, adult: Secondary | ICD-10-CM | POA: Diagnosis not present

## 2017-08-06 DIAGNOSIS — I272 Pulmonary hypertension, unspecified: Secondary | ICD-10-CM | POA: Diagnosis not present

## 2017-08-06 IMAGING — CT CT VIRTUAL COLONOSCOPY SCREENING
4 of 9 series · 12 of 36 positions shown, 18 images · non-contrast
Comparison: None.

CLINICAL DATA: Recent episodes of melena, screening.

EXAM:
CT VIRTUAL COLONOSCOPY SCREENING
TECHNIQUE: The patient was given a standard LoSo bowel preparation with
Gastrografin and barium for fluid and stool tagging respectively.
The quality of the bowel preparation is moderate. Automated CO2
insufflation of the colon was performed prior to image acquisition
and colonic distention is excellent. Image post processing was used
to generate a 3D endoluminal fly-through projection of the colon and
to electronically subtract stool/fluid as appropriate.

[Series 2: supine (id) · axial · 0.78mm/px · z∈[-316,-60]mm · 4 of 343 slices shown]
[im 69/343  soft-tissue]
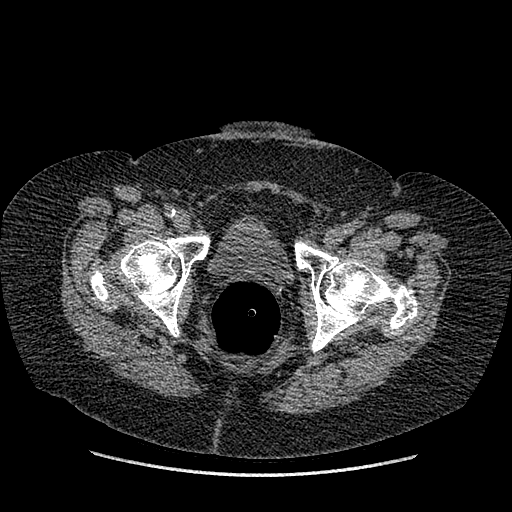
[im 137/343  soft-tissue]
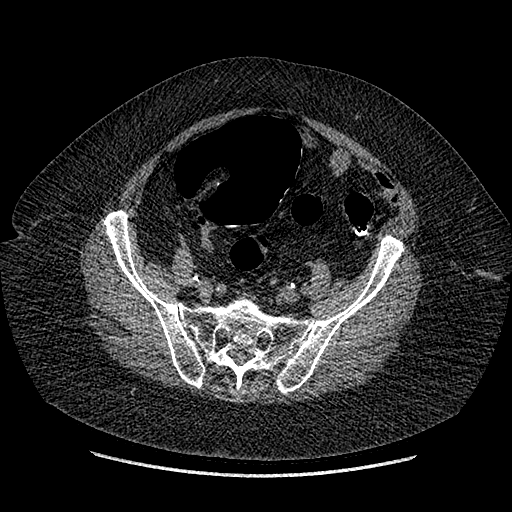
[im 206/343  soft-tissue]
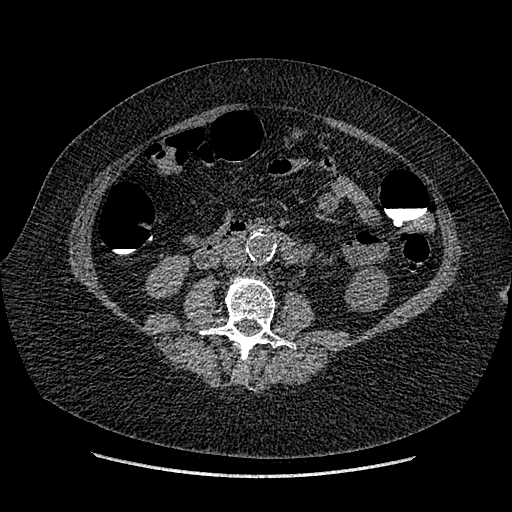
[im 274/343  soft-tissue]
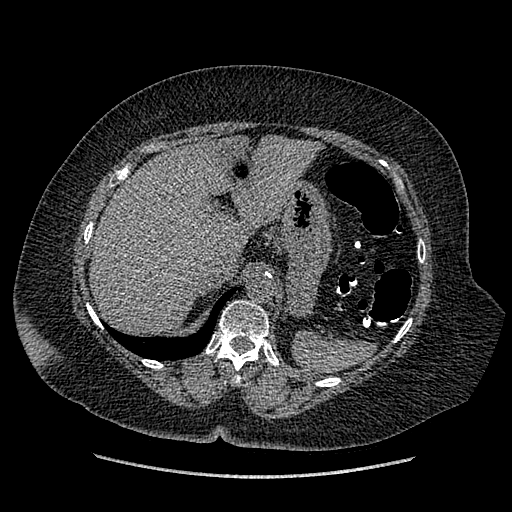

[Series 6: prone (id) · axial · 0.78mm/px · z∈[-331,-11]mm · 5 of 384 slices shown, 10 images]
[im 64/384  soft-tissue]
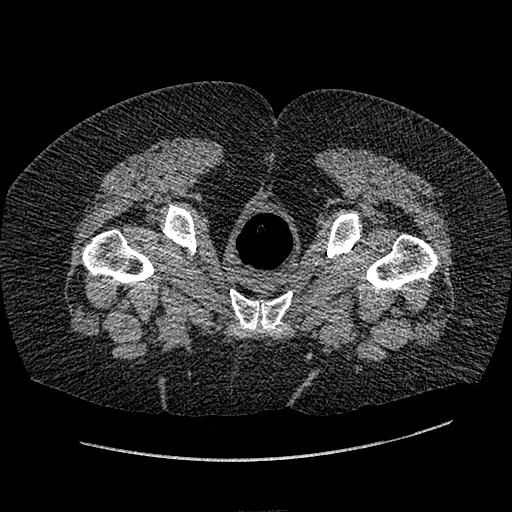
[im 64/384  bone]
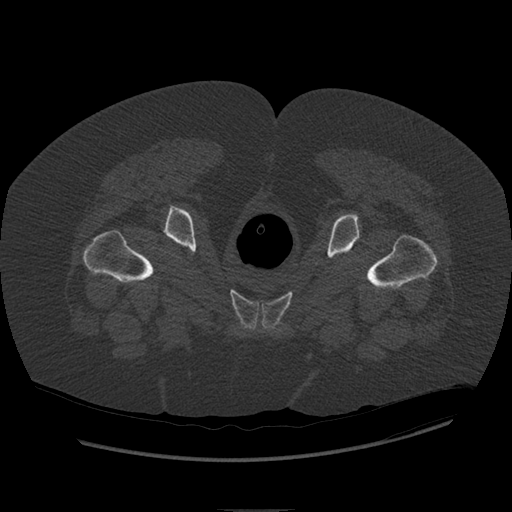
[im 128/384  soft-tissue]
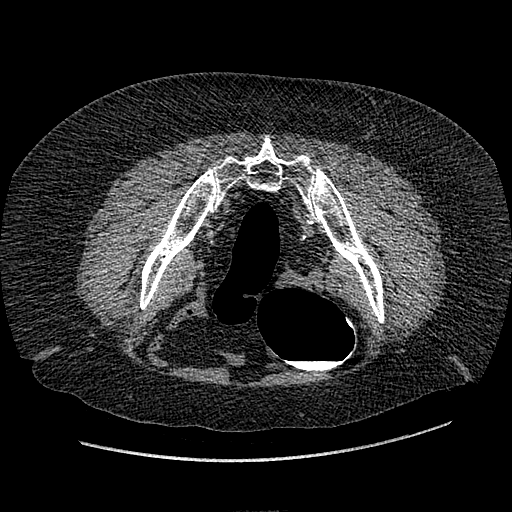
[im 128/384  lung]
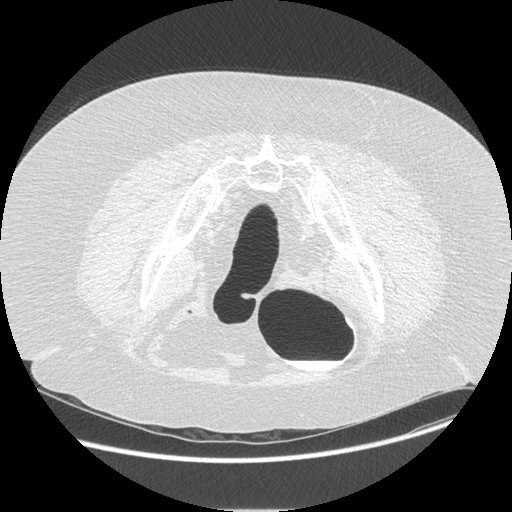
[im 192/384  soft-tissue]
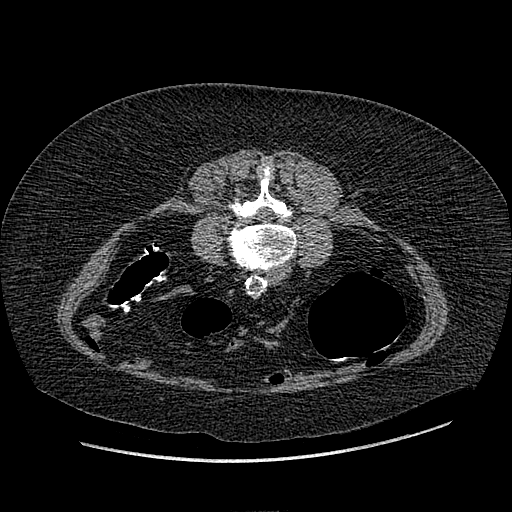
[im 192/384  lung]
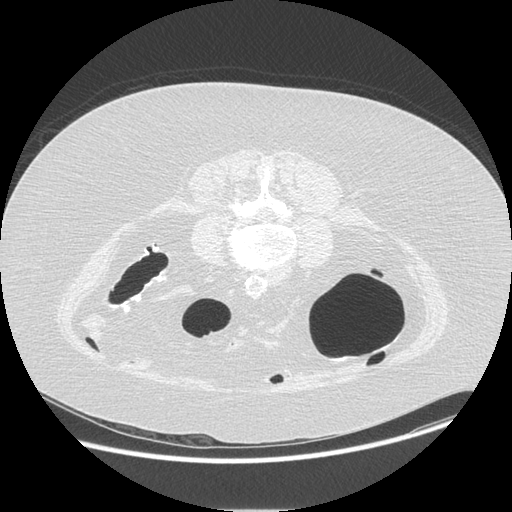
[im 256/384  soft-tissue]
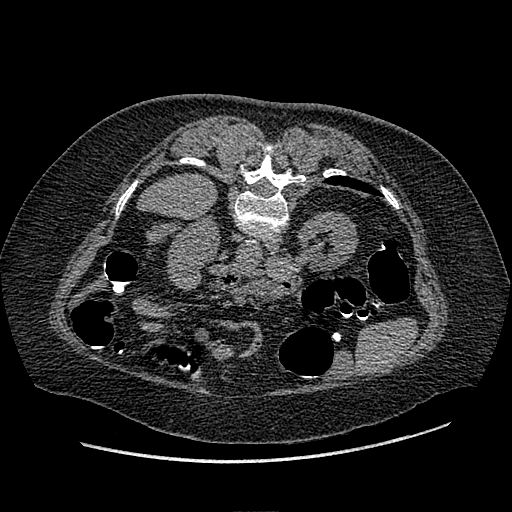
[im 256/384  lung]
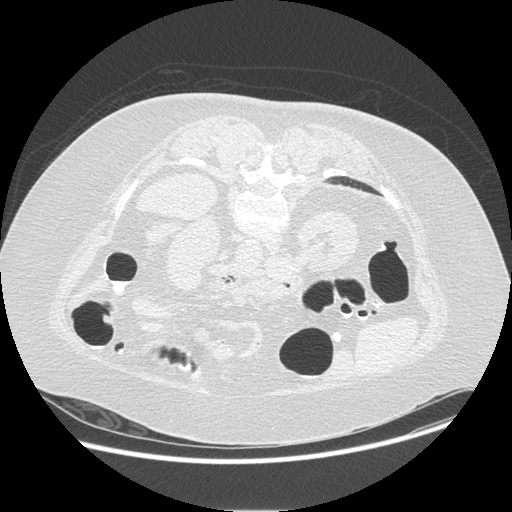
[im 320/384  soft-tissue]
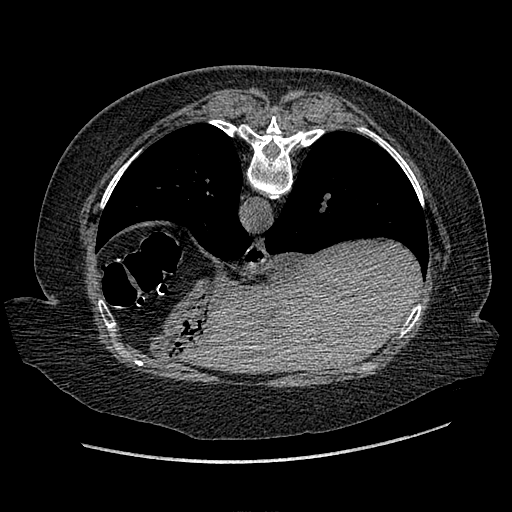
[im 320/384  lung]
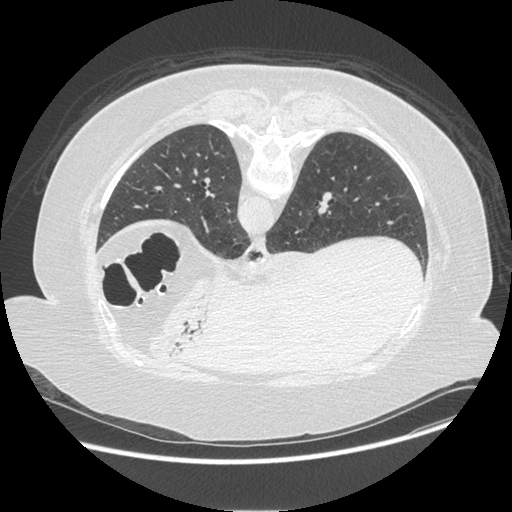

[Series 9: right decub 1.25 · axial · 0.78mm/px · z∈[-302,-212]mm · 2 of 362 slices shown]
[im 73/362  soft-tissue]
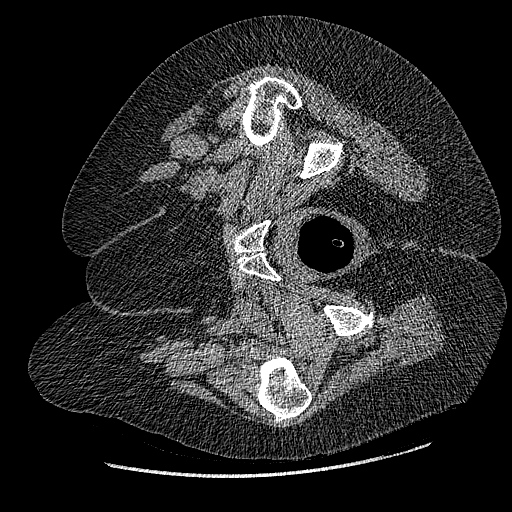
[im 145/362  soft-tissue]
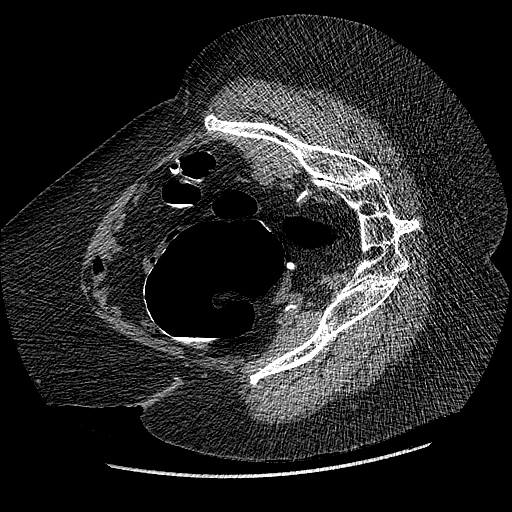

[Series 601: coronal body · coronal · 0.83mm/px · 1 of 130 slices shown, 2 images]
[im 44/130  soft-tissue]
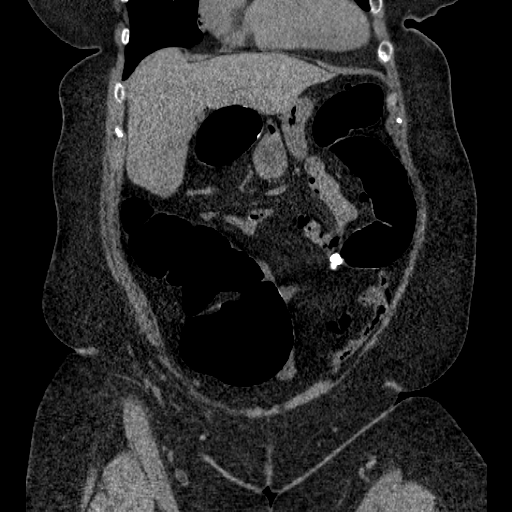
[im 44/130  bone]
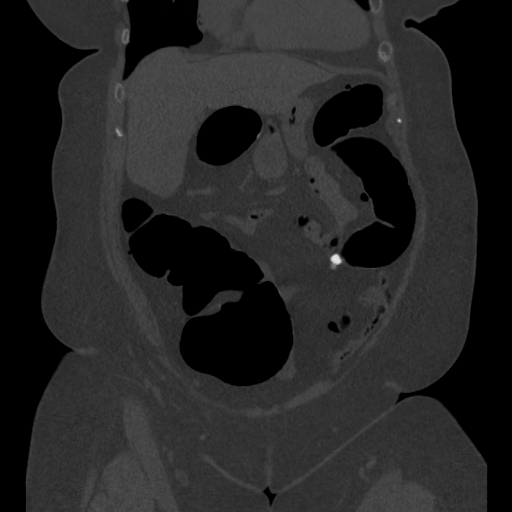

[12 of 36 positions shown; findings below may reference images not displayed]

FINDINGS: VIRTUAL COLONOSCOPY

No polyp, mass or stricture.

Virtual colonoscopy is not designed to detect diminutive polyps
(i.e., less than or equal to 5 mm), the presence or absence of which
may not affect clinical management.

CT ABDOMEN AND PELVIS WITHOUT CONTRAST

Lower chest: Lung bases show no acute findings. Heart is enlarged.
No pericardial or pleural effusion.

Hepatobiliary: Liver and gallbladder are unremarkable. No biliary
ductal dilatation.

Pancreas: Negative.

Spleen: Negative.

Adrenals/Urinary Tract: Adrenal glands and right kidney are
unremarkable. Low-attenuation mass off the lower pole left kidney
measures 5.3 cm and is likely a cyst although definitive
characterization is limited without post-contrast imaging. Ureters
are decompressed. Bladder is low in volume.

Stomach/Bowel: Stomach and small bowel are unremarkable. Colon is
discussed above.

Vascular/Lymphatic: Atherosclerotic calcification of the arterial
vasculature. Infrarenal aorta measures 3.1 cm. No pathologically
enlarged lymph nodes.

Reproductive: Uterus is visualized.  No adnexal mass.

Other: No free fluid.  Mesenteries and peritoneum are unremarkable.

Musculoskeletal: No worrisome lytic or sclerotic lesions.
Degenerative changes are seen in the spine.
IMPRESSION: 1. No polyp, mass or stricture in the colon.
2.  Aortic atherosclerosis (3SL0D-170.0).
3. Infrarenal aortic aneurysm. Recommend followup by ultrasound in 3
years. This recommendation follows ACR consensus guidelines: White
Paper of the ACR Incidental Findings Committee II on Vascular
Findings. [HOSPITAL] 2895; [DATE].

## 2017-08-07 ENCOUNTER — Ambulatory Visit
Admission: RE | Admit: 2017-08-07 | Discharge: 2017-08-07 | Disposition: A | Discharge status: Home / Self Care | Payer: Medicare Other | Source: Ambulatory Visit | Attending: Family Medicine | Admitting: Family Medicine

## 2017-08-07 DIAGNOSIS — Z1231 Encounter for screening mammogram for malignant neoplasm of breast: Secondary | ICD-10-CM

## 2017-08-11 ENCOUNTER — Encounter (HOSPITAL_COMMUNITY): Payer: Self-pay

## 2017-08-12 DIAGNOSIS — F0631 Mood disorder due to known physiological condition with depressive features: Secondary | ICD-10-CM | POA: Diagnosis not present

## 2017-08-13 ENCOUNTER — Encounter (HOSPITAL_COMMUNITY): Payer: Self-pay

## 2017-08-18 ENCOUNTER — Ambulatory Visit (INDEPENDENT_AMBULATORY_CARE_PROVIDER_SITE_OTHER): Payer: Medicare Other | Admitting: Pulmonary Disease

## 2017-08-18 ENCOUNTER — Encounter (HOSPITAL_COMMUNITY): Payer: Self-pay

## 2017-08-18 ENCOUNTER — Encounter: Payer: Self-pay | Admitting: Pulmonary Disease

## 2017-08-18 VITALS — BP 96/56 | HR 67 | Ht 63.0 in | Wt 194.6 lb

## 2017-08-18 DIAGNOSIS — I2510 Atherosclerotic heart disease of native coronary artery without angina pectoris: Secondary | ICD-10-CM | POA: Diagnosis not present

## 2017-08-18 DIAGNOSIS — I270 Primary pulmonary hypertension: Secondary | ICD-10-CM

## 2017-08-18 DIAGNOSIS — M359 Systemic involvement of connective tissue, unspecified: Secondary | ICD-10-CM

## 2017-08-18 DIAGNOSIS — D8989 Other specified disorders involving the immune mechanism, not elsewhere classified: Secondary | ICD-10-CM | POA: Diagnosis not present

## 2017-08-18 DIAGNOSIS — J449 Chronic obstructive pulmonary disease, unspecified: Secondary | ICD-10-CM | POA: Diagnosis not present

## 2017-08-18 DIAGNOSIS — J9611 Chronic respiratory failure with hypoxia: Secondary | ICD-10-CM

## 2017-08-18 NOTE — Progress Notes (Addendum)
Synopsis: Patient of Dr. 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  She also has diastolic heart failure.    Subjective:   PATIENT ID: Amy [name] GENDER: female DOB: 1945-12-14, MRN: [id number]   HPI  Chief Complaint  Patient presents with  . Follow-up    SOB with exertion, on home O2 5L pulsed 4L continuous, no chest tightness or wheezing.   She says that she continues to have shortness of breath.  She is using 4 L continuously at rest, 5 L with exertion.  No recent cough or mucus production.  She is using the breathing treatments.  She is using her CPAP more frequently.  She says that the sinus symptoms are about the same as they were when she saw me the last time.  She is now using oxygen with her CPAP.  Past Medical History:  Diagnosis Date  . Apical variant hypertrophic cardiomyopathy ([place])    Diagnosed by echo and ECG 11/13  . Atypical atrial flutter ([place])   . CAD (coronary artery disease)    a. LHC in 10/2011 demonstrated non-obstructive disease and an 80% lesion in a small D1 treated medically.   . CHF (congestive heart failure) ([place])   . Chronic diastolic heart failure ([place])   . CKD (chronic kidney disease), stage III ([place])   . COPD (chronic obstructive pulmonary disease) ([place])   . Dizziness    had PT for being off balance - in approx. 2012  . GERD (gastroesophageal reflux disease)   . Hyperlipidemia   . Hypertension   . Hypothyroid   . Lumbar disc disease   . Macular degeneration   . OSA (obstructive sleep apnea)   . Persistent atrial fibrillation ([place])       . Pulmonary hypertension ([place])    a. [place] 05/2013 showed mild PAH with normal PCWP and RA pressure, could be related to OSA and low oxygen saturation.   . Refusal of blood transfusions as patient is Jehovah's Witness   . Sinus bradycardia      Review of Systems  Constitutional: Positive for malaise/fatigue. Negative for chills,  fever and weight loss.  HENT: Negative for congestion, nosebleeds, sinus pain and sore throat.   Eyes: Negative for photophobia, pain and discharge.  Respiratory: Positive for cough and shortness of breath. Negative for hemoptysis, sputum production and wheezing.   Cardiovascular: Positive for leg swelling. Negative for chest pain, palpitations and orthopnea.  Gastrointestinal: Negative for abdominal pain, constipation, diarrhea, nausea and vomiting.  Genitourinary: Negative for dysuria, frequency, hematuria and urgency.  Musculoskeletal: Negative for back pain, joint pain, myalgias and neck pain.  Skin: Negative for itching and rash.  Neurological: Negative for tingling, tremors, sensory change, speech change, focal weakness, seizures, weakness and headaches.  Psychiatric/Behavioral: Negative for memory loss, substance abuse and suicidal ideas. The patient is not nervous/anxious.       Objective:  Physical Exam   Vitals:   08/18/17 1210 08/18/17 1211  BP:  (!) 96/56  Pulse:  67  SpO2:  92%  Weight: 194 lb 9.6 oz (88.3 kg)   Height: _0  (1.6 m)    4L [place]  Gen: chrnically ill appearing HENT: OP clear, TM's clear, neck supple PULM: CTA B, normal percussion CV: RRR, no mgr, trace edema GI: BS+, soft, nontender Derm: no cyanosis or rash Psyche: normal mood and affect    CBC    Component Value Date/Time   WBC 7.1 06/10/2017  0815   RBC 5.00 06/10/2017 0815   HGB 14.4 06/10/2017 0815   HCT 44.6 06/10/2017 0815   PLT 271 06/10/2017 0815   MCV 89.2 06/10/2017 0815   MCH 28.8 06/10/2017 0815   MCHC 32.3 06/10/2017 0815   RDW 17.3 (H) 06/10/2017 0815   LYMPHSABS 1.5 05/26/2016 1711   MONOABS 0.8 05/26/2016 1711   EOSABS 0.2 05/26/2016 1711   BASOSABS 0.1 05/26/2016 1711     Chest imaging: - V/Q scan negative 3/22 June 2017 high-resolution CT scan of the chest images independently reviewed showing patchy groundglass throughout with air trapping but no clear evidence  of underlying interstitial lung disease.  Some evidence of emphysema seen, motion degraded images  PFT: March 2018 ratio 83%, FVC 1.88 L 89% predicted, significant change with bronchodilator in the small airways, total lung capacity 3.63 L 76% predicted, DLCO 4.75 mL 22% predicted   Labs: - HIV, SCL70 negative.  RF borderline elevated, doubt significant.  ANA negative.   Path:  Echo: Echo (3/18) with EF 65-70%, apical hypertrophic cardiomyopathy, severe LAE, PASP 86 mmHg.  Echo (3/19): EF 65-70%, apical hypertrophic cardiomyopathy, normal RV size and systolic function, severe LAE.   Heart Catheterization: - RHC (3/18): mean RA 4, PA 69/23 mean 38, mean PCWP 8, CI 2.17, PVR 7.3 WU Fick, 9.6 WU thermo.  - RHC (3/19): mean RA 6, PA 49/16 mean 29, mean PCWP 10, CI 1.9/PVR 5.3 WU Fick, CI 1.4/PVR 7.3 Thermo.   6 Min walk: 4/18 6 minute walk: 109 m  10/18 6 minute walk: 229 m 2/19 6 minute walk: < 100 m  Records from her visit with cardiology recently evaluated where she was maintained on her current regimen of pulmonary vasodilators and diuretics and oxygen after the most recent right heart catheterization.  Records from her February 2019 visit with [name] here reviewed where she was advised to stop taking bronchodilators.      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  I am not sure what to make of this.  It was only weakly positive and she has no other signs or symptoms suggestive of an underlying vasculitis but I think we can ignore it.  I am awaiting the results of her rheumatology work-up.  I do wonder whether or not she has had a bubble study considering the worsening oxygenation she is experienced in the last year.  I will ask cardiology to perform this if it has not been done  already.  In general though I worry that the worsening oxygenation in the face of normalization of her pulmonary pressures is a very bad sign and she may ultimately need to have an open lung biopsy for diagnosis.  I think it would probably be safest for this to be done at [facility].  Plan: Abnormal CT chest with blood test positive for vasculitis: I am going to await the results from Dr. Lenna Gilford, I will call her office for those as well.   If the blood work from her office shows that there is an underlying autoimmune condition we will treat that  Asthma: Continue taking douneb 3-4 times per day  Pulmonary hypertension: Continue taking Opsumit and Uptravi as directed by Dr. Aundra Dubin  Chronic respiratory failure with hypoxemia: Continue using 4 L of oxygen at rest, 5 L with exertion  We will check to see if you have had a bubble study, if not we will make arrangements for this.  This is a test for would like to see if blood is shunting from the right side of your heart to the left.  We will make arrangements for follow-up visit in 4 weeks but will call you prior to that.  > 50% of this 26 minute visit spent face to face   Addendum Aug 19, 2017:  I received lab work from rheumatology which shows that the repeat P-ANCA test was negative, however the myositis panel showed an elevated R00-52 lab and the CK was elevated at 1030. The CCP was also elevated.  I will need to reach out to her rheumatologist to discuss a treatment plan further, however this strongly suggest we need to pursue immunosuppression.  We will have her come back for a visit and will start cellcept and prednisone:  Prednisone 26m daily, cellcept start 5050mbid x1 week, then 100037mid; bactrim 1 DS tab q MWF; monthly CMET, CBC  BreRoselie AwkwardD [place] P[facility]M Pager: [phone]: [phone] 3pm or if no response, call [phone]  Current Outpatient Medications:  .  acetaminophen (TYLENOL) 325 MG tablet, Take 2  tablets (650 mg total) by mouth every 4 (four) hours as needed for headache or mild pain., Disp: , Rfl:  .  atorvastatin (LIPITOR) 80 MG tablet, Take 1 tablet (80 mg total) by mouth daily with lunch., Disp: 90 tablet, Rfl: 3 .  Cholecalciferol (VITAMIN D) 2000 UNITS CAPS, Take 2,000 Units by mouth daily. , Disp: , Rfl:  .  colchicine 0.6 MG tablet, Take 0.6 mg by mouth daily. , Disp: , Rfl:  .  ezetimibe (ZETIA) 10 MG tablet, TAKE 1 TABLET BY MOUTH DAILY, Disp: 90 tablet, Rfl: 1 .  furosemide (LASIX) 20 MG tablet, Take 1 tablet (20 mg total) by mouth daily., Disp: 30 tablet, Rfl: 6 .  ipratropium-albuterol (DUONEB) 0.5-2.5 (3) MG/3ML SOLN, Take 3 mLs by nebulization 3 (three) times daily. DX: J44.9 (Patient taking differently: Take 3 mLs by nebulization 3 (three) times daily. Only able to take 1-2 times a day  DX: J44.9), Disp: 360 mL, Rfl: 5 .  levothyroxine (SYNTHROID, LEVOTHROID) 50 MCG tablet, Take 50 mcg by mouth daily before breakfast., Disp: , Rfl:  .  Macitentan (OPSUMIT) 10 MG TABS, Take 1 tablet (10 mg total) by mouth daily., Disp: 30 tablet, Rfl: 11 .  MULTAQ 400 MG tablet, TAKE 1 TABLET BY MOUTH TWICE A DAY WITH A MEAL, Disp: 180 tablet, Rfl: 3 .  Multiple Vitamin (MULTIVITAMIN) capsule, Take 1 capsule by mouth daily., Disp: , Rfl:  .  pantoprazole (PROTONIX) 40 MG tablet, Take 1 tablet (40 mg total) by mouth daily., Disp: 90 tablet, Rfl: 2 .  potassium chloride SA (K-DUR,KLOR-CON) 20 MEQ tablet, Take 0.5 tablets (10 mEq total) by mouth at bedtime., Disp: , Rfl:  .  Selexipag (UPTRAVI) 200 MCG TABS, Take 6 tablets (1,200 mcg total) by mouth 2 (two) times daily. (Patient taking differently: Take 1,400 mcg by mouth 2 (two) times daily. Taking 1400m87mn am and 1600mc39m evening), Disp: 360 tablet, Rfl: 11 .  traMADol (ULTRAM) 50 MG tablet, Take 50 mg by mouth every 8 (eight) hours as needed for moderate pain., Disp: , Rfl:  .  UNABLE TO FIND, Med Name: CPAP  [facility]ME[facility], Disp: , Rfl:  .   cyclobenzaprine (FLEXERIL) 5 MG tablet, Take 1 tablet (5  mg total) by mouth 3 (three) times daily as needed for muscle spasms. (Patient not taking: Reported on 08/18/2017), Disp: 10 tablet, Rfl: 0 .  metoprolol tartrate (LOPRESSOR) 25 MG tablet, Take 1 tablet (25 mg total) by mouth 2 (two) times daily. (Patient not taking: Reported on 08/18/2017), Disp: 60 tablet, Rfl: 3 .  mupirocin ointment (BACTROBAN) 2 %, Place 1 application into the nose every other day., Disp: , Rfl:  .  Rivaroxaban (XARELTO) 15 MG TABS tablet, Take 1 tablet (15 mg total) by mouth daily with supper., Disp: 30 tablet, Rfl: 11

## 2017-08-18 NOTE — Patient Instructions (Addendum)
Abnormal CT chest with blood test positive for vasculitis: I am going to await the results from Dr. Lenna Gilford, I will call her office for those as well.   If the blood work from her office shows that there is an underlying autoimmune condition we will treat that  Pulmonary hypertension: Continue taking Opsumit and Uptravi as directed by Dr. Aundra Dubin  Asthma: Continue taking douneb 3-4 times per day  Chronic respiratory failure with hypoxemia: Continue using 4 L of oxygen at rest, 5 L with exertion We will check to see if you have had a bubble study, if not we will make arrangements for this.  This is a test for would like to see if blood is shunting from the right side of your heart to the left.  We will make arrangements for follow-up visit in 4 weeks but will call you prior to that.

## 2017-08-19 ENCOUNTER — Ambulatory Visit (HOSPITAL_COMMUNITY)
Admission: RE | Admit: 2017-08-19 | Discharge: 2017-08-19 | Disposition: A | Discharge status: Home / Self Care | Payer: Medicare Other | Source: Ambulatory Visit | Attending: Cardiology | Admitting: Cardiology

## 2017-08-19 ENCOUNTER — Encounter (HOSPITAL_COMMUNITY): Payer: Self-pay | Admitting: Cardiology

## 2017-08-19 VITALS — BP 110/50 | HR 97 | Wt 195.0 lb

## 2017-08-19 DIAGNOSIS — I5032 Chronic diastolic (congestive) heart failure: Secondary | ICD-10-CM | POA: Diagnosis not present

## 2017-08-19 DIAGNOSIS — Z87891 Personal history of nicotine dependence: Secondary | ICD-10-CM | POA: Diagnosis not present

## 2017-08-19 DIAGNOSIS — Z7901 Long term (current) use of anticoagulants: Secondary | ICD-10-CM | POA: Diagnosis not present

## 2017-08-19 DIAGNOSIS — I2721 Secondary pulmonary arterial hypertension: Secondary | ICD-10-CM | POA: Insufficient documentation

## 2017-08-19 DIAGNOSIS — E039 Hypothyroidism, unspecified: Secondary | ICD-10-CM | POA: Insufficient documentation

## 2017-08-19 DIAGNOSIS — I422 Other hypertrophic cardiomyopathy: Secondary | ICD-10-CM | POA: Diagnosis not present

## 2017-08-19 DIAGNOSIS — J449 Chronic obstructive pulmonary disease, unspecified: Secondary | ICD-10-CM | POA: Insufficient documentation

## 2017-08-19 DIAGNOSIS — Z79899 Other long term (current) drug therapy: Secondary | ICD-10-CM | POA: Insufficient documentation

## 2017-08-19 DIAGNOSIS — Z79890 Hormone replacement therapy (postmenopausal): Secondary | ICD-10-CM | POA: Diagnosis not present

## 2017-08-19 DIAGNOSIS — K219 Gastro-esophageal reflux disease without esophagitis: Secondary | ICD-10-CM | POA: Diagnosis not present

## 2017-08-19 DIAGNOSIS — G4733 Obstructive sleep apnea (adult) (pediatric): Secondary | ICD-10-CM | POA: Diagnosis not present

## 2017-08-19 DIAGNOSIS — I2510 Atherosclerotic heart disease of native coronary artery without angina pectoris: Secondary | ICD-10-CM | POA: Diagnosis not present

## 2017-08-19 DIAGNOSIS — I480 Paroxysmal atrial fibrillation: Secondary | ICD-10-CM | POA: Diagnosis not present

## 2017-08-19 DIAGNOSIS — F0631 Mood disorder due to known physiological condition with depressive features: Secondary | ICD-10-CM | POA: Diagnosis not present

## 2017-08-19 DIAGNOSIS — E785 Hyperlipidemia, unspecified: Secondary | ICD-10-CM | POA: Diagnosis not present

## 2017-08-19 DIAGNOSIS — I270 Primary pulmonary hypertension: Secondary | ICD-10-CM

## 2017-08-19 LAB — BASIC METABOLIC PANEL
Anion gap: 8 (ref 5–15)
BUN: 15 mg/dL (ref 6–20)
CHLORIDE: 109 mmol/L (ref 101–111)
CO2: 24 mmol/L (ref 22–32)
CREATININE: 1.37 mg/dL — AB (ref 0.44–1.00)
Calcium: 9.1 mg/dL (ref 8.9–10.3)
GFR calc Af Amer: 43 mL/min — ABNORMAL LOW (ref >60)
GFR calc non Af Amer: 38 mL/min — ABNORMAL LOW (ref >60)
Glucose, Bld: 99 mg/dL (ref 65–99)
Potassium: 4.4 mmol/L (ref 3.5–5.1)
Sodium: 141 mmol/L (ref 135–145)

## 2017-08-19 NOTE — Patient Instructions (Signed)
Labs drawn today (if we do not call you, then your lab work was stable)   Your physician has requested that you have an echocardiogram. Echocardiography is a painless test that uses sound waves to create images of your heart. It provides your doctor with information about the size and shape of your heart and how well your heart's chambers and valves are working. This procedure takes approximately one hour. There are no restrictions for this procedure.  We did a 6 minute walk   Your physician recommends that you schedule a follow-up appointment in: 2 months with Dr. Aundra Dubin

## 2017-08-20 ENCOUNTER — Encounter (HOSPITAL_COMMUNITY): Payer: Self-pay

## 2017-08-21 NOTE — Progress Notes (Signed)
Done during OV

## 2017-08-21 NOTE — Progress Notes (Signed)
PCP: Dr. Radene Ou Cardiology: Dr. Radford Pax HF Cardiology: Dr. Aundra Dubin Pulmonary: Dr. Lake Bells  72 y.o. with history of COPD, OSA on CPAP, apical hypertrophic cardiomyopathy, chronic diastolic CHF, and paroxysmal atrial fibrillation returns for followup of CHF and pulmonary hypertension.  Patient was admitted in 3/18 with CHF and dyspnea, found to have pulmonary arterial hypertension by RHC.  Prior to this, she has had a long history of PAF.  She is currently taking dronedarone but has had breakthrough fibrillation.  She is in NSR today.  She had an atrial fibrillation ablation in the past. She has apical hypertrophic cardiomyopathy proven by cardiac MRI with a consistent ECG.   She is on Opsumit and has started Selexipag, now titrated up to 1400 mcg am/1600 mcg pm. She was unable to tolerate Adcirca.  She is doing pulmonary rehab. Symptomatically, she has been worse despite selective pulmonary vasodilators to treat PAH.  Oxygen requirement has increased to 3L at rest and 8L with exertion.  Dyspnea has worsened.   Because of these findings, I repeated her RHC in 3/19.  This showed lower PA pressure and normal right and left heart filling pressures, but more worrisomely, cardiac output was low.  However, on repeat of echo in 3/19, the RV actually looked normal in size and systolic function.  Given concern for possible PVOD as cause of her worsening symptoms on treatment, I ordered a high resolution CT chest.  This was degraded by respiratory artifact but showed no definite significant abnormalities.  I had her see Dr Lake Bells. He sent autoimmune serologies again, weak P-ANCA positivity so she was referred to rheumatology.  She has seen rheumatology, says a lot of blood tests were sent, and we are awaiting results. Dr. Lake Bells recommended echo with bubble and is considering referral for open lung biopsy.  Still querying PVOD or pulmonary capillary hemangiomatosis.  Also of note, she has had atrial fibrillation  ablation and pulmonary vein stenosis is a consideration.   She remains short of breath walking around the house and getting dressed, walking to car, etc.  No orthopnea/PND.  No chest pain.  No lightheadedness/syncope.  Weight is stable. She has finished pulmonary rehab.   Labs (2/18): TSH normal Labs (3/18): K 3.8, creatinine 1.26, BNP 424 Labs (5/18): K 4.4, creatinine 1.96, anti-SCL 70 negative, RF very slightly elevated, HIV negative.  Labs (6/18): K 4, creatinine 1.2, LDL 49 Labs (9/18): BNP 149, K 4, creatinine 1.4, BNP 149 Labs (2/19): BNP 126, K 3.8, creatinine 1.5  4/18 6 minute walk: 109 m  10/18 6 minute walk: 229 m 2/19 6 minute walk: < 100 m  ECG (personally reviewed): NSR, PVCs, inferolateral TWIs  PMH:  1. COPD: Quit smoking 2011.  - PFTs (3/18): minimal obstruction, minimal restriction, DLCO 22% (severely decreased).  2. Hypothyroidism 3. Hyperlipidemia 4. OSA: Uses CPAP 5. Apical hypertrophic cardiomyopathy:  - Cardiac MRI (7/16) with EF 79%, mid-apical LV hypertrophy with spade-like ventricle, mid-myocardial LGE was noted at the apex.   6. GERD 7. Atrial fibrillation: Paroxysmal.  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 Long QT interval so dofetilide and sotalol have not been used. She has had atrial fibrillation ablation.  - Currently on dronedarone.  8. Coronary artery disease: LHC (7/16) with nonobstructive disease + 85% ostial stenosis small D1.  9. Diastolic CHF: Echo (2/09) with EF 65-70%, apical hypertrophic cardiomyopathy, severe LAE, PASP 86 mmHg.  - RHC (3/18): mean RA 4, PA 69/23 mean 38, mean PCWP 8, CI 2.17, PVR 7.3 WU Fick, 9.6  WU thermo.  - Echo (3/19): EF 65-70%, apical hypertrophic cardiomyopathy, normal RV size and systolic function, severe LAE.  10. Pulmonary hypertension: See RHC above.  - V/Q scan negative 3/18 - HIV, SCL70 negative.  RF borderline elevated, doubt significant.  ANA negative.  - Unable to tolerate Adcirca - RHC (3/19): mean RA 6,  PA 49/16 mean 29, mean PCWP 10, CI 1.9/PVR 5.3 WU Fick, CI 1.4/PVR 7.3 Thermo.  - High resolution CT chest (3/19): respiratory motion artifact present, but suspect mild emphysema with lungs grossly clear.  - P-ANCA+ 11. CPX (12/17): peak VO2 8.5, RER 0.96, VE/VCO2 slope 67. Submaximal but appears to show severe functional impairment.    Social History   Socioeconomic History  . Marital status: Widowed    Spouse name: divorced.  . Number of children: 1  . Years of education: Not on file  . Highest education level: Not on file  Occupational History  . Occupation: retired. still works for [employer].   Social Needs  . Financial resource strain: Not on file  . Food insecurity:    Worry: Not on file    Inability: Not on file  . Transportation needs:    Medical: Not on file    Non-medical: Not on file  Tobacco Use  . Smoking status: Former Smoker    Packs/day: 0.50    Years: 44.00    Pack years: 22.00    Types: Cigarettes    Last attempt to quit: 04/07/2009    Years since quitting: 8.3  . Smokeless tobacco: Never Used  Substance and Sexual Activity  . Alcohol use: No  . Drug use: No  . Sexual activity: Never  Lifestyle  . Physical activity:    Days per week: Not on file    Minutes per session: Not on file  . Stress: Not on file  Relationships  . Social connections:    Talks on phone: Not on file    Gets together: Not on file    Attends religious service: Not on file    Active member of club or organization: Not on file    Attends meetings of clubs or organizations: Not on file    Relationship status: Not on file  . Intimate partner violence:    Fear of current or ex partner: Not on file    Emotionally abused: Not on file    Physically abused: Not on file    Forced sexual activity: Not on file  Other Topics Concern  . Not on file  Social History Narrative   Pt lives alone with 2 dogs.    Family History  Problem Relation Age of Onset  . Heart disease Sister        Good  Pastures Syndrome  . Gout Father   . Lung cancer Father        smoked  . Breast cancer Paternal Aunt    Review of systems complete and found to be negative unless listed in HPI.    Current Outpatient Medications  Medication Sig Dispense Refill  . acetaminophen (TYLENOL) 325 MG tablet Take 2 tablets (650 mg total) by mouth every 4 (four) hours as needed for headache or mild pain.    Marland Kitchen atorvastatin (LIPITOR) 80 MG tablet Take 1 tablet (80 mg total) by mouth daily with lunch. 90 tablet 3  . Cholecalciferol (VITAMIN D) 2000 UNITS CAPS Take 2,000 Units by mouth daily.     . colchicine 0.6 MG tablet Take 0.6 mg by mouth daily.     Marland Kitchen  cyclobenzaprine (FLEXERIL) 5 MG tablet Take 1 tablet (5 mg total) by mouth 3 (three) times daily as needed for muscle spasms. 10 tablet 0  . ezetimibe (ZETIA) 10 MG tablet TAKE 1 TABLET BY MOUTH DAILY 90 tablet 1  . furosemide (LASIX) 20 MG tablet Take 1 tablet (20 mg total) by mouth daily. 30 tablet 6  . ipratropium-albuterol (DUONEB) 0.5-2.5 (3) MG/3ML SOLN Take 3 mLs by nebulization 3 (three) times daily. DX: J44.9 (Patient taking differently: Take 3 mLs by nebulization 3 (three) times daily. Only able to take 1-2 times a day  DX: J44.9) 360 mL 5  . levothyroxine (SYNTHROID, LEVOTHROID) 50 MCG tablet Take 50 mcg by mouth daily before breakfast.    . Macitentan (OPSUMIT) 10 MG TABS Take 1 tablet (10 mg total) by mouth daily. 30 tablet 11  . metoprolol tartrate (LOPRESSOR) 25 MG tablet Take 1 tablet (25 mg total) by mouth 2 (two) times daily. 60 tablet 3  . MULTAQ 400 MG tablet TAKE 1 TABLET BY MOUTH TWICE A DAY WITH A MEAL 180 tablet 3  . Multiple Vitamin (MULTIVITAMIN) capsule Take 1 capsule by mouth daily.    . mupirocin ointment (BACTROBAN) 2 % Place 1 application into the nose every other day.    . pantoprazole (PROTONIX) 40 MG tablet Take 1 tablet (40 mg total) by mouth daily. 90 tablet 2  . potassium chloride SA (K-DUR,KLOR-CON) 20 MEQ tablet Take 0.5 tablets  (10 mEq total) by mouth at bedtime.    . Rivaroxaban (XARELTO) 15 MG TABS tablet Take 1 tablet (15 mg total) by mouth daily with supper. 30 tablet 11  . Selexipag (UPTRAVI) 200 MCG TABS 1425mg in the AM and 16065m in the PM    . traMADol (ULTRAM) 50 MG tablet Take 50 mg by mouth every 8 (eight) hours as needed for moderate pain.    . Marland KitchenNABLE TO FIND Med Name: CPAP  DME[facility]     No current facility-administered medications for this encounter.    BP (!) 110/50   Pulse 97   Wt 195 lb (88.5 kg)   SpO2 90% Comment: on 3L of O2  BMI 34.54 kg/m   General: NAD Neck: No JVD, no thyromegaly or thyroid nodule.  Lungs: Clear to auscultation bilaterally with normal respiratory effort. CV: Nondisplaced PMI.  Heart regular S1/S2, no S3/S4, no murmur.  No peripheral edema.  No carotid bruit.  Normal pedal pulses.  Abdomen: Soft, nontender, no hepatosplenomegaly, no distention.  Skin: Intact without lesions or rashes.  Neurologic: Alert and oriented x 3.  Psych: Normal affect. Extremities: No clubbing or cyanosis.  HEENT: Normal.   Assessment/Plan: 1. Chronic diastolic CHF: Echo 2/5/36ith EF 65-70%, normal RV size and systolic function.  Given worsening symptoms and the finding of low cardiac output on RHC, I had expected her RV to look hypokinetic. However, on 2/19 echo, the RV looked fairly normal. She is not volume overloaded and recent BNP was minimally elevated.  - Continue lasix 20 mg daily and potassium 10 meq daily.  - BMET today.  2. Apical hypertrophic cardiomyopathy: Cardiac MRI in 7/16 showed mid-apical LV hypertrophy with vigorous contraction. Now off metoprolol with slow HR (uses metoprolol prn tachycardia - atrial fibrillation).  ECG is consistent with apical HCM.   3. Atrial fibrillation:  Remains in NSR on Multaq.  Per Dr AlRayann Hemanwould not re-do her ablation. She has occasional episodes of tachypalpitations likely representing breakthrough atrial fibrillation but episodes are  infrequent.  -  Continue Xarelto.  No bleeding.   4. Pulmonary hypertension: RHC in 3/18 showed moderate pulmonary arterial hypertension with elevated PVR (7.3 WU by Fick, 9.6 WU by thermo). She has COPD but I do not think this plays a large role in the pulmonary hypertension =>  PFTs in 3/18 showed minimal obstruction and restriction, there was significantly decreased DLCO, this may be due to pulmonary vascular disease.  OSA may play a role but not a large one. V/Q scan did not suggest chronic PEs in 3/18.  RF borderline elevated (probably not significant), HIV negative, Anti-SCL70 negative, ANA negative, P-ANCA weakly positive.  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 I have been concerned that she could have pulmonary veno-occlusive disease or pulmonary capillary hemangiomatosis based on the response to [facility] treatment thus far.  Her RHC in 3/19 showed lower PA pressure and PVR with normal right and left heart filling pressures, but cardiac output was lower than prior.  Surprisingly, her RV looked normal on echo so I question how accurate the cardiac output measurement really was.  I did a high resolution CT to look for evidence of ILD, but it seems to have been degraded by respiratory artifact.  No severe abnormality was detected on the study.   - Also of note, she has a history of afib ablation so pulmonary vein stenosis certainly remains a concern => will discuss with Dr. Lake Bells but could consider a pulmonary vein study to assess.  - Continue Opsumit.     - She did not tolerate Adcirca.   - With no definite evidence for PVOD, we have continued to titrate up her selexipag.  - I will get echo with bubble study to look for any evidence for shunting.  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 Lake Bells in pulmonary division.  I am still not clear about what has caused her worsening symptoms.  He is considering an open lung biopsy.  - 6 minute walk today.   [name], MD  08/21/2017

## 2017-08-25 ENCOUNTER — Encounter (HOSPITAL_COMMUNITY): Payer: Self-pay

## 2017-08-25 NOTE — Progress Notes (Signed)
Pt attempted 6 minute walk test. Pt walked 350 ft (107 meters). O2 sats ranged from 90%-73%, and HR ranged from 97-113.

## 2017-08-25 NOTE — Addendum Note (Signed)
Encounter addended by: Shirley Muscat, RN on: 08/25/2017 2:13 PM  Actions taken: Sign clinical note

## 2017-08-26 DIAGNOSIS — F0631 Mood disorder due to known physiological condition with depressive features: Secondary | ICD-10-CM | POA: Diagnosis not present

## 2017-08-27 ENCOUNTER — Encounter (HOSPITAL_COMMUNITY): Payer: Self-pay

## 2017-09-01 ENCOUNTER — Encounter (HOSPITAL_COMMUNITY): Payer: Self-pay

## 2017-09-01 ENCOUNTER — Ambulatory Visit (INDEPENDENT_AMBULATORY_CARE_PROVIDER_SITE_OTHER): Payer: Medicare Other | Admitting: Adult Health

## 2017-09-01 ENCOUNTER — Encounter: Payer: Self-pay | Admitting: Adult Health

## 2017-09-01 DIAGNOSIS — I2510 Atherosclerotic heart disease of native coronary artery without angina pectoris: Secondary | ICD-10-CM

## 2017-09-01 DIAGNOSIS — J9611 Chronic respiratory failure with hypoxia: Secondary | ICD-10-CM

## 2017-09-01 DIAGNOSIS — I270 Primary pulmonary hypertension: Secondary | ICD-10-CM

## 2017-09-01 NOTE — Assessment & Plan Note (Addendum)
Pulmonary hypertension-  Discussion with Dr. Lake Bells , recent visit with Dr Trudie Reed -Rheumatology  Labs done with Rheumatology showed elevated CCP, RO-52 antibody and elevated CK. concerned that she may have an autoimmune mediated form of pulmonary hypertension and or pneumonitis.  Will need to start: Prednisone 81m daily, cellcept start 5050mbid x1 week, then 100032mid; bactrim 1 DS tab q MWF; monthly CMET, CBC Keep follow up in 2 weeks with Dr. McQLake Bells 09/15/17 as planned and As needed   Please contact office for sooner follow up if symptoms do not improve or worsen or seek emergency care   Pt called and aware of recommendation , and need for close follow up .

## 2017-09-01 NOTE — Progress Notes (Signed)
_0  ID: Amy [name], female    DOB: [birth year], 72 y.o.   MRN: [id number]    Referring provider: Aretta Nip, MD  HPI: 72 year old female former smoker seen for pulmonary consult July 03, 2017 for pulmonary hypertension and hypoxemia.  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 Past medical history significant for A. fib status post ablation in 3382, chronic diastolic congestive heart failure, apical hypertrophic cardiomyopathy  TEST  Chest imaging: - V/Q scan negative 3/22 June 2017 high-resolution CT scan of the chest images independently reviewed showing patchy groundglass throughout with air trapping but no clear evidence of underlying interstitial lung disease.  Some evidence of emphysema seen, motion degraded images  PFT: March 2018 ratio 83%, FVC 1.88 L 89% predicted, significant change with bronchodilator in the small airways, total lung capacity 3.63 L 76% predicted, DLCO 4.75 mL 22% predicted   Labs: - HIV, SCL70 negative. RF borderline elevated, doubt significant. ANA negative.   Path:  Echo: Echo (3/18) with EF 65-70%, apical hypertrophic cardiomyopathy, severe LAE, PASP 86 mmHg.  Echo (3/19): EF 65-70%, apical hypertrophic cardiomyopathy, normal RV size and systolic function, severe LAE.  Heart Catheterization: - RHC (3/18): mean RA 4, PA 69/23 mean 38, mean PCWP 8, CI 2.17, PVR 7.3 WU Fick, 9.6 WU thermo. - RHC (3/19): mean RA 6, PA 49/16 mean 29, mean PCWP 10, CI 1.9/PVR 5.3 WU Fick, CI 1.4/PVR 7.3 Thermo.   6 Min walk: 4/18 6 minute walk: 109 m  10/18 6 minute walk: 229 m 2/19 6 minute walk: < 100 m   09/01/2017 Follow up : Pulmonary HTN and Chronic hypoxic Respiratory failure  Patient returns for a 2-week follow-up.  She has a very complicated medical history.  She was admitted March 2018 congestive heart failure and hypoxia found to have pulmonary  hypertension by right heart cath.  She is being followed by cardiology and is on OPSUMIT and selexipag.  She had a repeat right heart cath March 2019 that showed decreased pulmonary artery pressures.  However patient has had progressive shortness of breath with increased oxygen demands, worsening 6-minute walk.  Patient does have history of COPD but PFTs showed minimum obstruction or restriction.  Patient did have a significantly decreased DLCO. She has OSA but felt this was not a hugely contributing factor.  Previous VQ scan did not suggest chronic PE.  She was set up for a high-resolution CT chest there did not appear to be any evidence of ILD but it was degraded by respiratory artifact..  She was seen last visit with autoimmune work-up showing a weakly positive P ANCA otherwise was unrevealing.  Patient was referred to rheumatology.  She was also set up for a 2D echo bubble study since last visit patient says she has been the same.  She continues to have shortness of breath with minimal activity requires 4 L of oxygen at rest and 6 L of oxygen with activity.  She has minimum cough.  No rash or joint swelling.  She has low energy but says she is independent does her own housework and driving..   Allergies  Allergen Reactions  . Amiodarone     Dyspnea - felt to be 2/2 amio lung toxicity Has tolerated Omnipaque   . Zyban [Bupropion] Itching    Immunization History  Administered Date(s) Administered  . Influenza Split 01/06/2016  . Influenza Whole 01/20/2017  . Influenza-Unspecified 01/06/2015  . Pneumococcal Polysaccharide-23 04/26/2013  Past Medical History:  Diagnosis Date  . Apical variant hypertrophic cardiomyopathy ([place])    Diagnosed by echo and ECG 11/13  . Atypical atrial flutter ([place])   . CAD (coronary artery disease)    a. LHC in 10/2011 demonstrated non-obstructive disease and an 80% lesion in a small D1 treated medically.   . CHF (congestive heart failure) ([place])   . Chronic  diastolic heart failure ([place])   . CKD (chronic kidney disease), stage III ([place])   . COPD (chronic obstructive pulmonary disease) ([place])   . Dizziness    had PT for being off balance - in approx. 2012  . GERD (gastroesophageal reflux disease)   . Hyperlipidemia   . Hypertension   . Hypothyroid   . Lumbar disc disease   . Macular degeneration   . OSA (obstructive sleep apnea)   . Persistent atrial fibrillation ([place])       . Pulmonary hypertension ([place])    a. [place] 05/2013 showed mild PAH with normal PCWP and RA pressure, could be related to OSA and low oxygen saturation.   . Refusal of blood transfusions as patient is Jehovah's Witness   . Sinus bradycardia     Tobacco History: Social History   Tobacco Use  Smoking Status Former Smoker  . Packs/day: 0.50  . Years: 44.00  . Pack years: 22.00  . Types: Cigarettes  . Last attempt to quit: 04/07/2009  . Years since quitting: 8.4  Smokeless Tobacco Never Used   Counseling given: Not Answered   Outpatient Encounter Medications as of 09/01/2017  Medication Sig  . acetaminophen (TYLENOL) 325 MG tablet Take 2 tablets (650 mg total) by mouth every 4 (four) hours as needed for headache or mild pain.  Marland Kitchen atorvastatin (LIPITOR) 80 MG tablet Take 1 tablet (80 mg total) by mouth daily with lunch.  . Cholecalciferol (VITAMIN D) 2000 UNITS CAPS Take 2,000 Units by mouth daily.   . colchicine 0.6 MG tablet Take 0.6 mg by mouth daily.   . cyclobenzaprine (FLEXERIL) 5 MG tablet Take 1 tablet (5 mg total) by mouth 3 (three) times daily as needed for muscle spasms.  Marland Kitchen ezetimibe (ZETIA) 10 MG tablet TAKE 1 TABLET BY MOUTH DAILY  . furosemide (LASIX) 20 MG tablet Take 1 tablet (20 mg total) by mouth daily.  Marland Kitchen ipratropium-albuterol (DUONEB) 0.5-2.5 (3) MG/3ML SOLN Take 3 mLs by nebulization 3 (three) times daily. DX: J44.9 (Patient taking differently: Take 3 mLs by nebulization 3 (three) times daily. Only able to take 1-2 times a day  DX: J44.9)  .  levothyroxine (SYNTHROID, LEVOTHROID) 50 MCG tablet Take 50 mcg by mouth daily before breakfast.  . Macitentan (OPSUMIT) 10 MG TABS Take 1 tablet (10 mg total) by mouth daily.  . metoprolol tartrate (LOPRESSOR) 25 MG tablet Take 1 tablet (25 mg total) by mouth 2 (two) times daily. (Patient taking differently: Take 25 mg by mouth as needed. )  . MULTAQ 400 MG tablet TAKE 1 TABLET BY MOUTH TWICE A DAY WITH A MEAL  . Multiple Vitamin (MULTIVITAMIN) capsule Take 1 capsule by mouth daily.  . mupirocin ointment (BACTROBAN) 2 % Place 1 application into the nose every other day.  . pantoprazole (PROTONIX) 40 MG tablet Take 1 tablet (40 mg total) by mouth daily.  . potassium chloride SA (K-DUR,KLOR-CON) 20 MEQ tablet Take 0.5 tablets (10 mEq total) by mouth at bedtime.  . Rivaroxaban (XARELTO) 15 MG TABS tablet Take 1 tablet (15 mg total) by mouth daily with  supper.  . Selexipag (UPTRAVI) 200 MCG TABS 1452mg in the AM and 16028m in the PM  . traMADol (ULTRAM) 50 MG tablet Take 50 mg by mouth every 8 (eight) hours as needed for moderate pain.  . Marland KitchenNABLE TO FIND Med Name: CPAP  DME-[facility]   No facility-administered encounter medications on file as of 09/01/2017.      Review of Systems  Constitutional:   No  weight loss, night sweats,  Fevers, chills,  +fatigue, or  lassitude.  HEENT:   No headaches,  Difficulty swallowing,  Tooth/dental problems, or  Sore throat,                No sneezing, itching, ear ache, nasal congestion, post nasal drip,   CV:  No chest pain,  Orthopnea, PND, swelling in lower extremities, anasarca, dizziness, palpitations, syncope.   GI  No heartburn, indigestion, abdominal pain, nausea, vomiting, diarrhea, change in bowel habits, loss of appetite, bloody stools.   Resp:    No chest wall deformity  Skin: no rash or lesions.  GU: no dysuria, change in color of urine, no urgency or frequency.  No flank pain, no hematuria   MS:  No joint pain or swelling.  No decreased  range of motion.  No back pain.    Physical Exam  BP 110/60   Pulse 68   Ht _0  (1.6 m)   Wt 189 lb 12.8 oz (86.1 kg)   SpO2 93%   BMI 33.62 kg/m   GEN: A/Ox3; pleasant , NAD, chronically ill-appearing on oxygen   HEENT:  [place]/AT,  EACs-clear, TMs-wnl, NOSE-clear, THROAT-clear, no lesions, no postnasal drip or exudate noted.   NECK:  Supple w/ fair ROM; no JVD; normal carotid impulses w/o bruits; no thyromegaly or nodules palpated; no lymphadenopathy.    RESP  Clear  P & A; w/o, wheezes/ rales/ or rhonchi. no accessory muscle use, no dullness to percussion  CARD:  RRR, no m/r/g, trace  peripheral edema, pulses intact, no cyanosis or clubbing.  GI:   Soft & nt; nml bowel sounds; no organomegaly or masses detected.   Musco: Warm bil, no deformities or joint swelling noted.   Neuro: alert, no focal deficits noted.    Skin: Warm, no lesions or rashes    Lab Results:  CBC    Component Value Date/Time   WBC 7.1 06/10/2017 0815   RBC 5.00 06/10/2017 0815   HGB 14.4 06/10/2017 0815   HCT 44.6 06/10/2017 0815   PLT 271 06/10/2017 0815   MCV 89.2 06/10/2017 0815   MCH 28.8 06/10/2017 0815   MCHC 32.3 06/10/2017 0815   RDW 17.3 (H) 06/10/2017 0815   LYMPHSABS 1.5 05/26/2016 1711   MONOABS 0.8 05/26/2016 1711   EOSABS 0.2 05/26/2016 1711   BASOSABS 0.1 05/26/2016 1711    BMET    Component Value Date/Time   NA 141 08/19/2017 1205   NA 140 08/19/2016 1433   K 4.4 08/19/2017 1205   CL 109 08/19/2017 1205   CO2 24 08/19/2017 1205   GLUCOSE 99 08/19/2017 1205   BUN 15 08/19/2017 1205   BUN 22 08/19/2016 1433   CREATININE 1.37 (H) 08/19/2017 1205   CREATININE 1.51 (H) 02/13/2016 1544   CALCIUM 9.1 08/19/2017 1205   GFRNONAA 38 (L) 08/19/2017 1205   GFRAA 43 (L) 08/19/2017 1205    BNP    Component Value Date/Time   BNP 126.0 (H) 06/02/2017 1013    ProBNP    Component Value  Date/Time   PROBNP 86.0 04/21/2014 1016    Imaging:    Assessment & Plan:    No problem-specific Assessment & Plan notes found for this encounter.     Rexene Edison, NP 09/01/2017

## 2017-09-01 NOTE — Assessment & Plan Note (Addendum)
Cont on O2 , goal to keep sats >90%.

## 2017-09-01 NOTE — Patient Instructions (Addendum)
Continue on current medications  Continue on Oxygen 4l/m rest and 6 l/m activity .  I will call with the new medication to start.  Follow up with Dr Lake Bells in 3 weeks and As needed   Please contact office for sooner follow up if symptoms do not improve or worsen or seek emergency care    Add :   Will need to start: Prednisone 55m daily, cellcept start 5012mbid x1 week, then 100035mid; bactrim 1 DS tab q MWF; monthly CMET, CBC

## 2017-09-02 ENCOUNTER — Other Ambulatory Visit: Payer: Self-pay | Admitting: Cardiology

## 2017-09-02 ENCOUNTER — Telehealth: Payer: Self-pay | Admitting: Adult Health

## 2017-09-02 ENCOUNTER — Telehealth: Payer: Self-pay | Admitting: Pulmonary Disease

## 2017-09-02 DIAGNOSIS — F0631 Mood disorder due to known physiological condition with depressive features: Secondary | ICD-10-CM | POA: Diagnosis not present

## 2017-09-02 MED ORDER — SULFAMETHOXAZOLE-TRIMETHOPRIM 800-160 MG PO TABS: 1.0000 | ORAL_TABLET | ORAL | 1 refills | Status: DC

## 2017-09-02 MED ORDER — MYCOPHENOLATE MOFETIL 500 MG PO TABS: 1000.0000 mg | ORAL_TABLET | Freq: Two times a day (BID) | ORAL | 1 refills | Status: DC

## 2017-09-02 MED ORDER — PREDNISONE 10 MG PO TABS: 20.0000 mg | ORAL_TABLET | Freq: Every day | ORAL | 1 refills | Status: DC

## 2017-09-02 NOTE — Telephone Encounter (Signed)
Xarelto 99m refill request received; pt is 72yrs old, wt-86.1kg, Crea-1.37 on 08/19/17, last seen by Dr. MAundra Dubinon 08/19/17, CrCl-50.448mmin; will send in refill to requested Pharmacy.

## 2017-09-02 NOTE — Telephone Encounter (Signed)
Juanito Doom, MD     09/02/17 8:17 AM  Note    I spoke to Dr. Trudie Reed about the recent blood work showing and elevated CCP, RO-52 antibody and elevated CK.  I am concerned that she may have an autoimmune mediated form of pulmonary hypertension and or pneumonitis.  Will need to start: Prednisone 69m daily, cellcept start 5052mbid x1 week, then 100040mid; bactrim 1 DS tab q MWF; monthly CMET, CBC       Spoke with patient. She is aware of BQ's recs. Medications have already been called in. Nothing else needed.

## 2017-09-02 NOTE — Telephone Encounter (Signed)
I spoke to Dr. Trudie Reed about the recent blood work showing and elevated CCP, RO-52 antibody and elevated CK.  I am concerned that Amy [name] may have an autoimmune mediated form of pulmonary hypertension and or pneumonitis.  Will need to start: Prednisone 61m daily, cellcept start 5016mbid x1 week, then 100069mid; bactrim 1 DS tab q MWF; monthly CMET, CBC

## 2017-09-03 ENCOUNTER — Encounter (HOSPITAL_COMMUNITY): Payer: Self-pay

## 2017-09-07 ENCOUNTER — Telehealth: Payer: Self-pay | Admitting: Pulmonary Disease

## 2017-09-07 NOTE — Telephone Encounter (Signed)
Called and spoke to patient. Patient stated she has not been able to fill her Cellcept and that the pharmacy said insurance wouldn't process medication coverage. Call and verified with pharmacy.  PA started: Tawanna Solo Key: [id number]

## 2017-09-08 ENCOUNTER — Encounter (HOSPITAL_COMMUNITY): Payer: Self-pay

## 2017-09-08 ENCOUNTER — Ambulatory Visit (HOSPITAL_COMMUNITY)
Admission: RE | Admit: 2017-09-08 | Discharge: 2017-09-08 | Disposition: A | Discharge status: Home / Self Care | Payer: Medicare Other | Source: Ambulatory Visit | Attending: Cardiology | Admitting: Cardiology

## 2017-09-08 DIAGNOSIS — I422 Other hypertrophic cardiomyopathy: Secondary | ICD-10-CM | POA: Insufficient documentation

## 2017-09-08 DIAGNOSIS — I509 Heart failure, unspecified: Secondary | ICD-10-CM | POA: Diagnosis not present

## 2017-09-08 DIAGNOSIS — I4891 Unspecified atrial fibrillation: Secondary | ICD-10-CM | POA: Diagnosis not present

## 2017-09-08 DIAGNOSIS — I270 Primary pulmonary hypertension: Secondary | ICD-10-CM | POA: Diagnosis not present

## 2017-09-08 DIAGNOSIS — G4730 Sleep apnea, unspecified: Secondary | ICD-10-CM | POA: Diagnosis not present

## 2017-09-08 DIAGNOSIS — Z87891 Personal history of nicotine dependence: Secondary | ICD-10-CM | POA: Diagnosis not present

## 2017-09-08 NOTE — Telephone Encounter (Signed)
I could not find this pr under this key. [facility] can you follow up on status. It may still be processing.

## 2017-09-08 NOTE — Progress Notes (Signed)
  Echocardiogram 2D Echocardiogram with bubble study has been performed.  Darlina Sicilian M 09/08/2017, 2:48 PM

## 2017-09-09 DIAGNOSIS — F0631 Mood disorder due to known physiological condition with depressive features: Secondary | ICD-10-CM | POA: Diagnosis not present

## 2017-09-09 NOTE — Telephone Encounter (Signed)
Checked status of PA, per [email or web address] it says: --------------  Cancelled on June 4  Please note this patient doesn't have rx benefits with [place] commercial line of business, all pharmacy inquiries are handled directly through their rx vendor, please contact your patient for additional information..CFowler -------------- [facility], who tried to run rx again and received same message stating that a PA was needed.  Pharmacist will call insurance to sort out, and will call back if anything further is needed from our office.  Will keep encounter open to follow up on.

## 2017-09-10 ENCOUNTER — Encounter (HOSPITAL_COMMUNITY): Payer: Self-pay

## 2017-09-10 DIAGNOSIS — H1859 Other hereditary corneal dystrophies: Secondary | ICD-10-CM | POA: Diagnosis not present

## 2017-09-10 DIAGNOSIS — H40011 Open angle with borderline findings, low risk, right eye: Secondary | ICD-10-CM | POA: Diagnosis not present

## 2017-09-10 DIAGNOSIS — H40022 Open angle with borderline findings, high risk, left eye: Secondary | ICD-10-CM | POA: Diagnosis not present

## 2017-09-10 DIAGNOSIS — Z961 Presence of intraocular lens: Secondary | ICD-10-CM | POA: Diagnosis not present

## 2017-09-10 NOTE — Telephone Encounter (Signed)
Will route to Burman Nieves for [employer]

## 2017-09-10 NOTE — Telephone Encounter (Signed)
Called and talked to [facility] pharmacy. Pharmacist stated everything was able to go through and patient was able to pick up medication on 09/09/17. Nothing further is needed at this time.

## 2017-09-11 ENCOUNTER — Other Ambulatory Visit (HOSPITAL_COMMUNITY): Payer: Self-pay | Admitting: Cardiology

## 2017-09-15 ENCOUNTER — Ambulatory Visit: Payer: Medicare Other | Admitting: Pulmonary Disease

## 2017-09-15 ENCOUNTER — Other Ambulatory Visit: Payer: Self-pay | Admitting: *Deleted

## 2017-09-15 ENCOUNTER — Encounter (HOSPITAL_COMMUNITY): Payer: Self-pay

## 2017-09-15 NOTE — Patient Outreach (Addendum)
[place] [facility]) Care Management  09/15/2017  Amy [name] 1945/08/14 [id number]   Telephone Screen  Referral Date: 09/14/17  Referral Source: self referral  Referral Reason: assistance with medications Insurance: Medicare and Blue cross and [employer] attempt # 1 [facility] RN CM left HIPAA compliant voicemail message along with CM's contact info.  Plan: [facility] RN CM sent an unsuccessful outreach letter to include L Tammi Klippel and this [place] RN CM contact information and scheduled this patient for another call attempt within 4 business days  Assisting and covering [place]. Lavina Hamman, RN, BSN, CCM [facility] Telephonic Care Management Care Coordinator Direct number [phone]  Main [facility] number [phone] Fax number [phone]

## 2017-09-16 ENCOUNTER — Other Ambulatory Visit: Payer: Self-pay | Admitting: *Deleted

## 2017-09-16 DIAGNOSIS — F0631 Mood disorder due to known physiological condition with depressive features: Secondary | ICD-10-CM | POA: Diagnosis not present

## 2017-09-16 NOTE — Patient Outreach (Signed)
[place] [facility]) Care Management  09/16/2017  Minola Guin [name] 1945/09/14 [id number]   Telephone Screen  Referral Date: 09/14/17  Referral Source: self referral  Referral Reason: assistance with medications Insurance: Medicare and Blue cross and [employer] attempt # 2 successful Patient is able to verify HIPAA Reviewed and addressed referral to [facility] with patient  Social: Mrs Kenyon Ana is widowed and lives alone but does have her daughter locally at this time to assist.  She reports that her daughter will be returning "up state soon"  She reports her health as fair. 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 She wears 4-6 L of oxygen at home She is seeing a counselor for anxiety and depression. She reports she is suppose to have hearing aids but does not think she needs them. " I hear good"   Conditions:  CAD, chronic diastolic HF, Pulmonary hypertension, PAD, HTN, atrial fibrillation, OSA, hypothyroidism, dyslipidemia, morbid obesity, hx NSTEMI, chest pain ,   Medications: Mrs  Kenyon Ana reports on 09/15/17 she missed two of doses of medicines related to poor pain management concerns. She reports she believes all her medications are causing her side effects like headaches and drowsiness.  She reports she has gradually had to increase one of her medicines over a year's time.  She reports being overwhelmed bu what she is taking and voiced interest in getting with decreasing the amount of medications she is taking.  She wants to know if she can stop any of her medicines.  Recently had MD assistance with Cellcept 500 mg bid   Appointments: She reports having an appointment with Dr Lake Bells on 09/15/17 but had to reschedule the appointment to 09/16/17 to see the NP, T Parrett, related to her elevated pain status on 09/15/17. She denies issues with transportation to her appointments. The last time she saw her primary MD was in May 2019  Advance  Directives: Mrs Kenyon Ana denies concerns with advance directives and reports she has a HCPOA  Consent: [facility] RN CM reviewed [facility] services with patient. Patient gave verbal consent for services.       Plan: [facility] RN CM will refer this patient to [place] for polypharmacy management, assistance with reviewing medications for possible decrease in medications and cost management (recent assist from MD office for cellcept.    Ashantia Amaral L. Lavina Hamman, RN, BSN, CCM [facility] Telephonic Care Management Care Coordinator Direct number [phone]  Main [facility] number [phone] Fax number [phone]

## 2017-09-17 ENCOUNTER — Other Ambulatory Visit (INDEPENDENT_AMBULATORY_CARE_PROVIDER_SITE_OTHER): Payer: Medicare Other

## 2017-09-17 ENCOUNTER — Ambulatory Visit (INDEPENDENT_AMBULATORY_CARE_PROVIDER_SITE_OTHER): Payer: Medicare Other | Admitting: Adult Health

## 2017-09-17 ENCOUNTER — Encounter: Payer: Self-pay | Admitting: Adult Health

## 2017-09-17 ENCOUNTER — Encounter (HOSPITAL_COMMUNITY): Payer: Self-pay

## 2017-09-17 VITALS — BP 110/62 | HR 64 | Ht 63.0 in | Wt 194.0 lb

## 2017-09-17 DIAGNOSIS — I2510 Atherosclerotic heart disease of native coronary artery without angina pectoris: Secondary | ICD-10-CM | POA: Diagnosis not present

## 2017-09-17 DIAGNOSIS — J449 Chronic obstructive pulmonary disease, unspecified: Secondary | ICD-10-CM

## 2017-09-17 DIAGNOSIS — I270 Primary pulmonary hypertension: Secondary | ICD-10-CM

## 2017-09-17 DIAGNOSIS — J9611 Chronic respiratory failure with hypoxia: Secondary | ICD-10-CM | POA: Diagnosis not present

## 2017-09-17 DIAGNOSIS — I5032 Chronic diastolic (congestive) heart failure: Secondary | ICD-10-CM

## 2017-09-17 LAB — CBC WITH DIFFERENTIAL/PLATELET
BASOS ABS: 0.1 10*3/uL (ref 0.0–0.1)
Basophils Relative: 0.5 % (ref 0.0–3.0)
EOS ABS: 0.2 10*3/uL (ref 0.0–0.7)
Eosinophils Relative: 1.2 % (ref 0.0–5.0)
HEMATOCRIT: 36.3 % (ref 36.0–46.0)
HEMOGLOBIN: 11.9 g/dL — AB (ref 12.0–15.0)
LYMPHS PCT: 9.9 % — AB (ref 12.0–46.0)
Lymphs Abs: 1.5 10*3/uL (ref 0.7–4.0)
MCHC: 32.7 g/dL (ref 30.0–36.0)
MCV: 94.1 fl (ref 78.0–100.0)
MONOS PCT: 8.8 % (ref 3.0–12.0)
Monocytes Absolute: 1.3 10*3/uL — ABNORMAL HIGH (ref 0.1–1.0)
Neutro Abs: 12.2 10*3/uL — ABNORMAL HIGH (ref 1.4–7.7)
Neutrophils Relative %: 79.6 % — ABNORMAL HIGH (ref 43.0–77.0)
Platelets: 276 10*3/uL (ref 150.0–400.0)
RBC: 3.86 Mil/uL — AB (ref 3.87–5.11)
RDW: 18.4 % — ABNORMAL HIGH (ref 11.5–15.5)
WBC: 15.3 10*3/uL — AB (ref 4.0–10.5)

## 2017-09-17 LAB — HEPATIC FUNCTION PANEL
ALBUMIN: 3.6 g/dL (ref 3.5–5.2)
ALK PHOS: 84 U/L (ref 39–117)
ALT: 30 U/L (ref 0–35)
AST: 18 U/L (ref 0–37)
Bilirubin, Direct: 0.1 mg/dL (ref 0.0–0.3)
TOTAL PROTEIN: 6.7 g/dL (ref 6.0–8.3)
Total Bilirubin: 0.5 mg/dL (ref 0.2–1.2)

## 2017-09-17 LAB — BASIC METABOLIC PANEL
BUN: 29 mg/dL — ABNORMAL HIGH (ref 6–23)
CO2: 24 mEq/L (ref 19–32)
Calcium: 8.8 mg/dL (ref 8.4–10.5)
Chloride: 105 mEq/L (ref 96–112)
Creatinine, Ser: 1.47 mg/dL — ABNORMAL HIGH (ref 0.40–1.20)
GFR: 44.88 mL/min — ABNORMAL LOW (ref >60.00)
Glucose, Bld: 86 mg/dL (ref 70–99)
Potassium: 4.7 mEq/L (ref 3.5–5.1)
SODIUM: 136 meq/L (ref 135–145)

## 2017-09-17 NOTE — Assessment & Plan Note (Signed)
Clinically appears stable without evidence of volume overload on exam.  Recent echo showed worsening diastolic dysfunction with a grade 3 on echo Continue on current regimen continue follow with cardiology

## 2017-09-17 NOTE — Progress Notes (Signed)
_0  ID: Amy [name], female    DOB: May 08, 1945, 72 y.o.   MRN: [id number]  Chief Complaint  Patient presents with  . Follow-up    Pulmonary HTN     Referring provider: Aretta Nip, MD  HPI: 72 year old female former smoker seen for pulmonary consult July 03, 2017 for pulmonary hypertension and hypoxemia.  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 Past medical history significant for A. fib status post ablation in 5035, chronic diastolic congestive heart failure, apical hypertrophic cardiomyopathy  TEST  Chest imaging: - V/Q scan negative 3/22 June 2017 high-resolution CT scan of the chest images independently reviewed showing patchy groundglass throughout with air trapping but no clear evidence of underlying interstitial lung disease. Some evidence of emphysema seen, motion degraded images  PFT: March 2018 ratio 83%, FVC 1.88 L 89% predicted, significant change with bronchodilator in the small airways, total lung capacity 3.63 L 76% predicted, DLCO 4.75 mL 22% predicted   Labs: - HIV, SCL70 negative. RF borderline elevated, doubt significant. ANA negative.   Path:  Echo: Echo (3/18) with EF 65-70%, apical hypertrophic cardiomyopathy, severe LAE, PASP 86 mmHg.  Echo (3/19): EF 65-70%, apical hypertrophic cardiomyopathy, normal RV size and systolic function, severe LAE.  Heart Catheterization: - RHC (3/18): mean RA 4, PA 69/23 mean 38, mean PCWP 8, CI 2.17, PVR 7.3 WU Fick, 9.6 WU thermo. - RHC (3/19): mean RA 6, PA 49/16 mean 29, mean PCWP 10, CI 1.9/PVR 5.3 WU Fick, CI 1.4/PVR 7.3 Thermo.   6 Min walk: 4/18 6 minute walk: 109 m  10/18 6 minute walk: 229 m 2/19 6 minute walk: < 100 m   09/17/2017 Follow up : Pulmonary hypertension, chronic hypoxic respiratory failure Patient presents for a 2-week follow-up.  Patient has a very complicated medical history.  She was  admitted in March 2018 with congestive heart failure and hypoxemia.  She was found to have pulmonary hypertension on right heart cath.  She has been followed by cardiology and is on OPSUMIT and selexipag.  Right heart cath March 2019 showed decreased pulmonary artery pressures however patient had progressive shortness of breath with increased oxygen demands and worsening 6-minute walk.  PFTs showed minimal obstruction or restriction.  But is significantly decreased DLCO.  High-resolution CT chest did not appear to have any evidence of ILD but was degraded by respiratory artifact.  Previous VQ scan did not suggest chronic PE. 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  Otherwise was unrevealing.  Patient was referred to rheumatology.  She was seen by Dr. Lenna Gilford.  Lab work showed an elevated CCP, R0-52 antibody and elevated CK.  It was felt patient may have an autoimmune mediated form of pulmonary hypertension and/or pneumonitis.  He was recommended start prednisone 20 mg daily.  And begin CellCept along with Bactrim.  Patient says she began prednisone.  Had to wait a week for insurance to approve CellCept.  Shortly after starting CellCept.  Patient had significant joint pain along the shoulders and hips.  She said it was quite severe to the point where she was having a hard time moving .  Over the last 2 to 3 days symptoms have slightly improved and now mainly left shoulder pain persist.  Breathing has remained stable.  She had no increased oxygen demands.  Remains on 4 L of oxygen at rest and 6 L with activity.  Denies any nausea vomiting or diarrhea.  Appetite is low but is eating..  Vision had a echo with bubble study done on September 08, 2017 that showed EF 65 to 70%, moderate LVH, thickening of the left ventricle apex.  Grade 3 diastolic dysfunction.  Severely dilated left atrium.  Pulmonary artery pressure 42 mmHg.  No PFO identified or late shunting. Essentially stable since March 2019 except for  worsening diastolic dysfunction.    Allergies  Allergen Reactions  . Amiodarone     Dyspnea - felt to be 2/2 amio lung toxicity Has tolerated Omnipaque   . Zyban [Bupropion] Itching    Immunization History  Administered Date(s) Administered  . Influenza Split 01/06/2016  . Influenza Whole 01/20/2017  . Influenza-Unspecified 01/06/2015  . Pneumococcal Polysaccharide-23 04/26/2013    Past Medical History:  Diagnosis Date  . Apical variant hypertrophic cardiomyopathy ([place])    Diagnosed by echo and ECG 11/13  . Atypical atrial flutter ([place])   . CAD (coronary artery disease)    a. LHC in 10/2011 demonstrated non-obstructive disease and an 80% lesion in a small D1 treated medically.   . CHF (congestive heart failure) ([place])   . Chronic diastolic heart failure ([place])   . CKD (chronic kidney disease), stage III ([place])   . COPD (chronic obstructive pulmonary disease) ([place])   . Dizziness    had PT for being off balance - in approx. 2012  . GERD (gastroesophageal reflux disease)   . Hyperlipidemia   . Hypertension   . Hypothyroid   . Lumbar disc disease   . Macular degeneration   . OSA (obstructive sleep apnea)   . Persistent atrial fibrillation ([place])       . Pulmonary hypertension ([place])    a. [place] 05/2013 showed mild PAH with normal PCWP and RA pressure, could be related to OSA and low oxygen saturation.   . Refusal of blood transfusions as patient is Jehovah's Witness   . Sinus bradycardia     Tobacco History: Social History   Tobacco Use  Smoking Status Former Smoker  . Packs/day: 0.50  . Years: 44.00  . Pack years: 22.00  . Types: Cigarettes  . Last attempt to quit: 04/07/2009  . Years since quitting: 8.4  Smokeless Tobacco Never Used   Counseling given: Not Answered   Outpatient Encounter Medications as of 09/17/2017  Medication Sig  . acetaminophen (TYLENOL) 325 MG tablet Take 2 tablets (650 mg total) by mouth every 4 (four) hours as needed for headache or mild  pain.  Marland Kitchen atorvastatin (LIPITOR) 80 MG tablet Take 1 tablet (80 mg total) by mouth daily with lunch.  . Cholecalciferol (VITAMIN D) 2000 UNITS CAPS Take 2,000 Units by mouth daily.   . colchicine 0.6 MG tablet Take 0.6 mg by mouth daily.   . cyclobenzaprine (FLEXERIL) 5 MG tablet Take 1 tablet (5 mg total) by mouth 3 (three) times daily as needed for muscle spasms.  Marland Kitchen dronedarone (MULTAQ) 400 MG tablet Take 1 tablet (400 mg total) by mouth 2 (two) times daily with a meal. Please make overdue appt with Dr. Radford Pax. 1st attempt  . ezetimibe (ZETIA) 10 MG tablet Take 1 tablet (10 mg total) by mouth daily. Please make overdue appt with Dr. Radford Pax before anymore refills. 1st attempt  . furosemide (LASIX) 20 MG tablet Take 1 tablet (20 mg total) by mouth daily.  Marland Kitchen ipratropium-albuterol (DUONEB) 0.5-2.5 (3) MG/3ML SOLN Take 3 mLs by nebulization 3 (three) times daily. DX: J44.9 (Patient taking differently: Take 3 mLs by nebulization 3 (  three) times daily. Only able to take 1-2 times a day  DX: J44.9)  . levothyroxine (SYNTHROID, LEVOTHROID) 50 MCG tablet Take 50 mcg by mouth daily before breakfast.  . Macitentan (OPSUMIT) 10 MG TABS Take 1 tablet (10 mg total) by mouth daily.  . metoprolol tartrate (LOPRESSOR) 25 MG tablet Take 1 tablet (25 mg total) by mouth 2 (two) times daily. (Patient taking differently: Take 25 mg by mouth as needed. )  . Multiple Vitamin (MULTIVITAMIN) capsule Take 1 capsule by mouth daily.  . mupirocin ointment (BACTROBAN) 2 % Place 1 application into the nose every other day.  . mycophenolate (CELLCEPT) 500 MG tablet Take 2 tablets (1,000 mg total) by mouth 2 (two) times daily.  . pantoprazole (PROTONIX) 40 MG tablet Take 1 tablet (40 mg total) by mouth daily.  . potassium chloride SA (K-DUR,KLOR-CON) 20 MEQ tablet Take 0.5 tablets (10 mEq total) by mouth at bedtime.  . predniSONE (DELTASONE) 10 MG tablet Take 2 tablets (20 mg total) by mouth daily with breakfast.  . Selexipag  (UPTRAVI) 200 MCG TABS 1420mg in the AM and 16046m in the PM  . sulfamethoxazole-trimethoprim (BACTRIM DS) 800-160 MG tablet Take 1 tablet by mouth 3 (three) times a week.  . traMADol (ULTRAM) 50 MG tablet Take 50 mg by mouth every 8 (eight) hours as needed for moderate pain.  . Marland KitchenNABLE TO FIND Med Name: CPAP  [facility]  . XARELTO 15 MG TABS tablet TAKE 1 TABLET BY MOUTH EVERY DAY WITH SUPPER   No facility-administered encounter medications on file as of 09/17/2017.      Review of Systems  Constitutional:   No  weight loss, night sweats,  Fevers, chills,  +fatigue, or  lassitude.  HEENT:   No headaches,  Difficulty swallowing,  Tooth/dental problems, or  Sore throat,                No sneezing, itching, ear ache, nasal congestion, post nasal drip,   CV:  No chest pain,  Orthopnea, PND, swelling in lower extremities, anasarca, dizziness, palpitations, syncope.   GI  No heartburn, indigestion, abdominal pain, nausea, vomiting, diarrhea, change in bowel habits, loss of appetite, bloody stools.   Resp: No chest wall deformity  Skin: no rash or lesions.  GU: no dysuria, change in color of urine, no urgency or frequency.  No flank pain, no hematuria   MS:  +joint pain     Physical Exam  BP 110/62 (BP Location: Right Arm, Cuff Size: Normal)   Pulse 64   Ht _0  (1.6 m)   Wt 194 lb (88 kg)   SpO2 98%   BMI 34.37 kg/m   GEN: A/Ox3; pleasant , NAD, chronically ill appearing .    HEENT:  [place]/AT,  EACs-clear, TMs-wnl, NOSE-clear, THROAT-clear, no lesions, no postnasal drip or exudate noted.   NECK:  Supple w/ fair ROM; no JVD; normal carotid impulses w/o bruits; no thyromegaly or nodules palpated; no lymphadenopathy.    RESP  Decreased BS in bases   no accessory muscle use, no dullness to percussion  CARD:  RRR, no m/r/g, no peripheral edema, pulses intact, no cyanosis or clubbing.  GI:   Soft & nt; nml bowel sounds; no organomegaly or masses detected.   Musco: Warm bil, no  deformities or joint swelling noted.   Neuro: alert, no focal deficits noted.    Skin: Warm, no lesions or rashes    Lab Results:  CBC    Component Value Date/Time  WBC 7.1 06/10/2017 0815   RBC 5.00 06/10/2017 0815   HGB 14.4 06/10/2017 0815   HCT 44.6 06/10/2017 0815   PLT 271 06/10/2017 0815   MCV 89.2 06/10/2017 0815   MCH 28.8 06/10/2017 0815   MCHC 32.3 06/10/2017 0815   RDW 17.3 (H) 06/10/2017 0815   LYMPHSABS 1.5 05/26/2016 1711   MONOABS 0.8 05/26/2016 1711   EOSABS 0.2 05/26/2016 1711   BASOSABS 0.1 05/26/2016 1711    BMET    Component Value Date/Time   NA 141 08/19/2017 1205   NA 140 08/19/2016 1433   K 4.4 08/19/2017 1205   CL 109 08/19/2017 1205   CO2 24 08/19/2017 1205   GLUCOSE 99 08/19/2017 1205   BUN 15 08/19/2017 1205   BUN 22 08/19/2016 1433   CREATININE 1.37 (H) 08/19/2017 1205   CREATININE 1.51 (H) 02/13/2016 1544   CALCIUM 9.1 08/19/2017 1205   GFRNONAA 38 (L) 08/19/2017 1205   GFRAA 43 (L) 08/19/2017 1205    BNP    Component Value Date/Time   BNP 126.0 (H) 06/02/2017 1013    ProBNP    Component Value Date/Time   PROBNP 86.0 04/21/2014 1016    Imaging: No results found.   Assessment & Plan:   Asthmatic bronchitis , chronic (HCC) Appears stable with no change.  Current regimen  Chronic diastolic heart failure (HCC) Clinically appears stable without evidence of volume overload on exam.  Recent echo showed worsening diastolic dysfunction with a grade 3 on echo Continue on current regimen continue follow with cardiology  Chronic respiratory failure with hypoxia ([place]) O2 demands remained stable.  Continue on current regimen 4 L at rest 6 L with activity  Pulmonary hypertension, primary ([place]) Pulmonary hypertension stable on echo.  No PFO on bubble study. 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  Patient was recently started on CellCept, prednisone and Bactrim.  Unfortunately she  has significant side effects unclear if this is due to CellCept but seem to occur at the initiation.  For now we will hold CellCept, check labs including CBC be met and LFTs.  If normal.  We will consider restarting CellCept with Bactrim at lower dose with a slow titration up to see if more tolerable. If joint pains persist despite stopping CellCept will need referral to orthopedics. Patient needs to get follow-up back with rheumatology we will set this up.      Rexene Edison, NP 09/17/2017

## 2017-09-17 NOTE — Assessment & Plan Note (Signed)
Appears stable with no change.  Current regimen

## 2017-09-17 NOTE — Assessment & Plan Note (Signed)
O2 demands remained stable.  Continue on current regimen 4 L at rest 6 L with activity

## 2017-09-17 NOTE — Assessment & Plan Note (Signed)
Pulmonary hypertension stable on echo.  No PFO on bubble study. 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  Patient was recently started on CellCept, prednisone and Bactrim.  Unfortunately she has significant side effects unclear if this is due to CellCept but seem to occur at the initiation.  For now we will hold CellCept, check labs including CBC be met and LFTs.  If normal.  We will consider restarting CellCept with Bactrim at lower dose with a slow titration up to see if more tolerable. If joint pains persist despite stopping CellCept will need referral to orthopedics. Patient needs to get follow-up back with rheumatology we will set this up.

## 2017-09-17 NOTE — Patient Instructions (Signed)
Stop Cellcept for now .  Labs today . If labs okay , will remain off Cellcept for 1 week and restart at lower dose.  Get follow up with Dr. Trudie Reed Rheumatology .  If shoulder /joint pain do not resolve will need referral to orthopedic.  Continue on Oxygen 4l/m rest and 6 l/m activity .  Remain on Prednisone 26m daily  Hold Bactrim until cellcept is restarted.  Follow up with Dr. MLake Bellsin 4 weeks and As needed   Please contact office for sooner follow up if symptoms do not improve or worsen or seek emergency care

## 2017-09-18 ENCOUNTER — Ambulatory Visit: Payer: Self-pay | Admitting: *Deleted

## 2017-09-18 NOTE — Progress Notes (Signed)
Spoke with pt and notified of results per [facility]. Pt verbalized understanding and denied any questions. Rheum ref was placed on 07/07/17

## 2017-09-22 ENCOUNTER — Encounter (HOSPITAL_COMMUNITY): Payer: Self-pay

## 2017-09-22 ENCOUNTER — Telehealth: Payer: Self-pay | Admitting: Pharmacist

## 2017-09-22 ENCOUNTER — Encounter: Payer: Self-pay | Admitting: Pharmacist

## 2017-09-22 NOTE — Telephone Encounter (Signed)
-----  Message from Jiles Harold sent at 09/16/2017  2:36 PM EDT ----- Regarding: Referral: Order for Palma Holter  Referral from Jackelyn Poling, RN  "Please see below request"  Forde Radon ----- Message ----- From: Barbaraann Faster, RN Sent: 09/16/2017   1:14 PM To: Thn Cm Communication Orders Subject: Order for [facility]                 Patient Name: Amy [name], Amy [name] W([id number]) Sex: Female DOB: 10-Dec-1945    PCP: RANKINS, [place]: [place] of orders made on 09/16/2017: Nursing  Order Date:09/16/2017 Ordering User:GIBBS, Mickel Crow [[id number]] Encounter Provider:Gibbs, Mickel Crow, RN [[id number]] Aut horizing Provider: Aretta Nip, MD [[id number]] Department:[facility][[id number]]  Order Specific Information Order: Comm to Pharmacy [Custom: [id number]]  Order #: [id number] Qty: 1   Priority: Routine  Class: Clinic Performed   Comment:Referral from: Barbaraann Faster, [facility] Telephonic RN CM            Polypharmacy med review-patient taking greater than 10 meds            Medication assistance. Pa tient unable to afford meds            Medication management-patient having trouble managing meds            Patient request assist for polypharmacy management, assistance with             reviewing medications for possible decrease in medications and cost             management (recent assist from MD office for cellcept.)     Priority: Routine  Class: Clinic Performed   Comment:Referral from: Barbaraann Faster, [facility] Telep honic RN CM            Polypharmacy med review-patient taking greater than 10 meds            Medication assistance. Patient unable to afford meds            Medication management-patient having trouble managing meds            Patient request assist for polypharmacy management, assistance with             reviewing medications for possible decrease in medications and cost             management  (recent assist from MD office for cellcept.)

## 2017-09-22 NOTE — Progress Notes (Signed)
Reviewed, agree

## 2017-09-23 DIAGNOSIS — F0631 Mood disorder due to known physiological condition with depressive features: Secondary | ICD-10-CM | POA: Diagnosis not present

## 2017-09-23 NOTE — Patient Outreach (Signed)
[facility]) Care Management  [place]   09/23/2017  Amy [name] 04-May-1945 [id number]  Subjective: Patient was called regarding medication management and assistance. HIPAA identifiers were obtained. Patient is a 72 year old female with multiple medical conditions including but not limited to:  Cardiomyopathy, dyslipidemia, hypertension, pulmonary hypertension, and A Fib status post ablation.    Patient reported having severe shoulder pain after being started on mycophenolate. She stopped taking mycophenolate until her next rheumatology appointment. Patient's PCP is aware.  Objective:   Encounter Medications: Outpatient Encounter Medications as of 09/22/2017  Medication Sig Note  . acetaminophen (TYLENOL) 325 MG tablet Take 2 tablets (650 mg total) by mouth every 4 (four) hours as needed for headache or mild pain.   Marland Kitchen atorvastatin (LIPITOR) 80 MG tablet Take 1 tablet (80 mg total) by mouth daily with lunch.   . Cholecalciferol (VITAMIN D) 2000 UNITS CAPS Take 2,000 Units by mouth daily.    . colchicine 0.6 MG tablet Take 0.6 mg by mouth daily.    . cyclobenzaprine (FLEXERIL) 5 MG tablet Take 1 tablet (5 mg total) by mouth 3 (three) times daily as needed for muscle spasms.   Marland Kitchen dronedarone (MULTAQ) 400 MG tablet Take 1 tablet (400 mg total) by mouth 2 (two) times daily with a meal. Please make overdue appt with Dr. Radford Pax. 1st attempt   . ezetimibe (ZETIA) 10 MG tablet Take 1 tablet (10 mg total) by mouth daily. Please make overdue appt with Dr. Radford Pax before anymore refills. 1st attempt   . furosemide (LASIX) 20 MG tablet Take 1 tablet (20 mg total) by mouth daily.   Marland Kitchen ipratropium-albuterol (DUONEB) 0.5-2.5 (3) MG/3ML SOLN Take 3 mLs by nebulization 3 (three) times daily. DX: J44.9 (Patient taking differently: Take 3 mLs by nebulization 3 (three) times daily. Only able to take 1-2 times a day  DX: J44.9)   . levothyroxine (SYNTHROID, LEVOTHROID) 50 MCG  tablet Take 50 mcg by mouth daily before breakfast.   . Macitentan (OPSUMIT) 10 MG TABS Take 1 tablet (10 mg total) by mouth daily.   . metoprolol tartrate (LOPRESSOR) 25 MG tablet Take 1 tablet (25 mg total) by mouth 2 (two) times daily. (Patient taking differently: Take 25 mg by mouth as needed. )   . Multiple Vitamin (MULTIVITAMIN) capsule Take 1 capsule by mouth daily.   . mupirocin ointment (BACTROBAN) 2 % Place 1 application into the nose every other day.   . mycophenolate (CELLCEPT) 500 MG tablet Take 2 tablets (1,000 mg total) by mouth 2 (two) times daily.   . pantoprazole (PROTONIX) 40 MG tablet Take 1 tablet (40 mg total) by mouth daily.   . potassium chloride SA (K-DUR,KLOR-CON) 20 MEQ tablet Take 0.5 tablets (10 mEq total) by mouth at bedtime.   . predniSONE (DELTASONE) 10 MG tablet Take 2 tablets (20 mg total) by mouth daily with breakfast.   . Selexipag (UPTRAVI) 200 MCG TABS 1429mg in the AM and 16044m in the PM   . traMADol (ULTRAM) 50 MG tablet Take 50 mg by mouth every 8 (eight) hours as needed for moderate pain.   . Marland KitchenNABLE TO FIND Med Name: CPAP  DME[facility]   . XARELTO 15 MG TABS tablet TAKE 1 TABLET BY MOUTH EVERY DAY WITH SUPPER   . sulfamethoxazole-trimethoprim (BACTRIM DS) 800-160 MG tablet Take 1 tablet by mouth 3 (three) times a week. 09/22/2017: Currently being held   No facility-administered encounter medications on file as of 09/22/2017.  Functional Status: No flowsheet data found.  Fall/Depression Screening: Fall Risk  11/07/2016 10/17/2016 10/13/2016  Falls in the past year? No No No  Risk for fall due to : - - Impaired mobility   PHQ 2/9 Scores 09/16/2017 05/07/2017 11/07/2016 10/17/2016 10/13/2016 10/06/2016  PHQ - 2 Score _0 PHQ- 9 Score - 10 - - 4 4      Assessment: Patient's mediations were reviewed via telephone.   Drugs sorted by  system:  Neurologic/Psychologic:   Cardiovascular: Metoprolol Atorvastatin Furosemide Ezetimibe Multaq Xarelto  Opsumit Uptravi    Pulmonary/Allergy: Ipratropium-Albuterol   Gastrointestinal: Pantoprazole  Endocrine: Levothyroxine Colchicine  Autoimmune Prednisone CellCept (mycophenolate)  Topical: Mupirocin  Pain: Acetaminophen Tramadol Cyclobenzaprine   Vitamins/Minerals: Potassium chloride Multiple Vitamin  Infectious Diseases: Sulfamethoxazole-Trimethoprim Miscellaneous:  Patient's main concern was that she wanted to know if there were any medications she could stop taking.  Unfortunately, due to patient's comorbid conditions, no recommendation for discontinuation of medications was found at initial review.  Patient has an appointment with her rheumatologist on 10/01/17 and will speak with him about the necessity of both prednisone and Mycophenolate since she had severe shoulder pain.   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   Elayne [name], PharmD, [place] Clinical Pharmacist [phone]

## 2017-09-24 ENCOUNTER — Encounter (HOSPITAL_COMMUNITY): Payer: Self-pay

## 2017-09-29 ENCOUNTER — Encounter (HOSPITAL_COMMUNITY): Payer: Self-pay

## 2017-09-29 DIAGNOSIS — F0631 Mood disorder due to known physiological condition with depressive features: Secondary | ICD-10-CM | POA: Diagnosis not present

## 2017-10-01 ENCOUNTER — Encounter (HOSPITAL_COMMUNITY): Payer: Self-pay

## 2017-10-01 ENCOUNTER — Telehealth (HOSPITAL_COMMUNITY): Payer: Self-pay | Admitting: *Deleted

## 2017-10-01 DIAGNOSIS — I272 Pulmonary hypertension, unspecified: Secondary | ICD-10-CM | POA: Diagnosis not present

## 2017-10-01 DIAGNOSIS — J849 Interstitial pulmonary disease, unspecified: Secondary | ICD-10-CM | POA: Diagnosis not present

## 2017-10-01 DIAGNOSIS — R748 Abnormal levels of other serum enzymes: Secondary | ICD-10-CM | POA: Diagnosis not present

## 2017-10-01 DIAGNOSIS — E669 Obesity, unspecified: Secondary | ICD-10-CM | POA: Diagnosis not present

## 2017-10-01 DIAGNOSIS — R768 Other specified abnormal immunological findings in serum: Secondary | ICD-10-CM | POA: Diagnosis not present

## 2017-10-01 DIAGNOSIS — Z6834 Body mass index (BMI) 34.0-34.9, adult: Secondary | ICD-10-CM | POA: Diagnosis not present

## 2017-10-01 NOTE — Telephone Encounter (Signed)
Patient called in stating she has been having more episodes of afib over the last week with HRs in the 120-140s. Pt states she has tried taking the PRN metoprolol but as soon as it runs out she returns to afib. Pt had been on daily metoprolol but was taken off earlier this year due to bradycardia. Pt will try taking metoprolol scheduled 1/2 tab twice a day and see if this smooths out heart rate. If not she will call back on Monday for appointment. Pt in agreement with plan.

## 2017-10-06 ENCOUNTER — Other Ambulatory Visit: Payer: Self-pay

## 2017-10-06 ENCOUNTER — Emergency Department (HOSPITAL_COMMUNITY): Payer: Medicare Other

## 2017-10-06 ENCOUNTER — Emergency Department (HOSPITAL_COMMUNITY)
Admission: EM | Admit: 2017-10-06 | Discharge: 2017-10-06 | Disposition: A | Discharge status: Home / Self Care | Payer: Medicare Other | Attending: Emergency Medicine | Admitting: Emergency Medicine

## 2017-10-06 ENCOUNTER — Encounter (HOSPITAL_COMMUNITY): Payer: Self-pay

## 2017-10-06 DIAGNOSIS — Z87891 Personal history of nicotine dependence: Secondary | ICD-10-CM | POA: Insufficient documentation

## 2017-10-06 DIAGNOSIS — E039 Hypothyroidism, unspecified: Secondary | ICD-10-CM | POA: Insufficient documentation

## 2017-10-06 DIAGNOSIS — I2510 Atherosclerotic heart disease of native coronary artery without angina pectoris: Secondary | ICD-10-CM | POA: Diagnosis not present

## 2017-10-06 DIAGNOSIS — I130 Hypertensive heart and chronic kidney disease with heart failure and stage 1 through stage 4 chronic kidney disease, or unspecified chronic kidney disease: Secondary | ICD-10-CM | POA: Diagnosis not present

## 2017-10-06 DIAGNOSIS — I272 Pulmonary hypertension, unspecified: Secondary | ICD-10-CM | POA: Diagnosis not present

## 2017-10-06 DIAGNOSIS — I4891 Unspecified atrial fibrillation: Secondary | ICD-10-CM | POA: Diagnosis not present

## 2017-10-06 DIAGNOSIS — R7989 Other specified abnormal findings of blood chemistry: Secondary | ICD-10-CM | POA: Diagnosis not present

## 2017-10-06 DIAGNOSIS — R0602 Shortness of breath: Secondary | ICD-10-CM | POA: Diagnosis not present

## 2017-10-06 DIAGNOSIS — I5032 Chronic diastolic (congestive) heart failure: Secondary | ICD-10-CM | POA: Insufficient documentation

## 2017-10-06 DIAGNOSIS — I4892 Unspecified atrial flutter: Secondary | ICD-10-CM | POA: Insufficient documentation

## 2017-10-06 DIAGNOSIS — N183 Chronic kidney disease, stage 3 (moderate): Secondary | ICD-10-CM | POA: Insufficient documentation

## 2017-10-06 DIAGNOSIS — I509 Heart failure, unspecified: Secondary | ICD-10-CM

## 2017-10-06 DIAGNOSIS — Z79899 Other long term (current) drug therapy: Secondary | ICD-10-CM | POA: Insufficient documentation

## 2017-10-06 DIAGNOSIS — R778 Other specified abnormalities of plasma proteins: Secondary | ICD-10-CM

## 2017-10-06 DIAGNOSIS — I110 Hypertensive heart disease with heart failure: Secondary | ICD-10-CM | POA: Diagnosis not present

## 2017-10-06 DIAGNOSIS — R457 State of emotional shock and stress, unspecified: Secondary | ICD-10-CM | POA: Diagnosis not present

## 2017-10-06 LAB — CBC
HCT: 36.8 % (ref 36.0–46.0)
Hemoglobin: 11.4 g/dL — ABNORMAL LOW (ref 12.0–15.0)
MCH: 29.9 pg (ref 26.0–34.0)
MCHC: 31 g/dL (ref 30.0–36.0)
MCV: 96.6 fL (ref 78.0–100.0)
PLATELETS: 257 10*3/uL (ref 150–400)
RBC: 3.81 MIL/uL — ABNORMAL LOW (ref 3.87–5.11)
RDW: 20 % — AB (ref 11.5–15.5)
WBC: 6.8 10*3/uL (ref 4.0–10.5)

## 2017-10-06 LAB — BASIC METABOLIC PANEL
ANION GAP: 6 (ref 5–15)
BUN: 30 mg/dL — ABNORMAL HIGH (ref 8–23)
CALCIUM: 8.4 mg/dL — AB (ref 8.9–10.3)
CO2: 22 mmol/L (ref 22–32)
Chloride: 113 mmol/L — ABNORMAL HIGH (ref 98–111)
Creatinine, Ser: 1.74 mg/dL — ABNORMAL HIGH (ref 0.44–1.00)
GFR, EST AFRICAN AMERICAN: 33 mL/min — AB (ref >60)
GFR, EST NON AFRICAN AMERICAN: 28 mL/min — AB (ref >60)
GLUCOSE: 83 mg/dL (ref 70–99)
POTASSIUM: 4.7 mmol/L (ref 3.5–5.1)
SODIUM: 141 mmol/L (ref 135–145)

## 2017-10-06 LAB — I-STAT TROPONIN, ED
TROPONIN I, POC: 0.11 ng/mL — AB (ref 0.00–0.08)
TROPONIN I, POC: 0.1 ng/mL — AB (ref 0.00–0.08)

## 2017-10-06 LAB — BRAIN NATRIURETIC PEPTIDE: B Natriuretic Peptide: 675.7 pg/mL — ABNORMAL HIGH (ref 0.0–100.0)

## 2017-10-06 MED ORDER — PROPOFOL 10 MG/ML IV BOLUS
INTRAVENOUS | Status: AC | PRN
  Administered 2017-10-06: 44.4 mg via INTRAVENOUS

## 2017-10-06 MED ORDER — PROPOFOL 10 MG/ML IV BOLUS: 0.5000 mg/kg | INTRAVENOUS | Status: DC | PRN

## 2017-10-06 MED ORDER — PROPOFOL 10 MG/ML IV BOLUS
INTRAVENOUS | Status: AC
  Filled 2017-10-06: qty 20

## 2017-10-06 NOTE — Discharge Instructions (Addendum)
Follow up with your cardiologist as recommended, return for recurrent symptoms

## 2017-10-06 NOTE — ED Provider Notes (Addendum)
[facility] EMERGENCY DEPARTMENT Provider Note   CSN: [id number] Arrival date & time: 10/06/17  0846     History   Chief Complaint Chief Complaint  Patient presents with  . Atrial Fibrillation    HPI Amy [name] is a 72 y.o. female.  HPI Pt has a history of a fib.  She has been having trouble the past week with her a fib acting up.  Her rate was up into the 130s.  She called the office on Friday.   SHe had not heard back from them.  Pt usually does not stay in it long but she has been in it now the whole weekend.  She is now feeling short of breath, she is having some swellling.  No chest pain.  No fever.  Last meal was last night.  She has been taking her xarelto regularly. Past Medical History:  Diagnosis Date  . Apical variant hypertrophic cardiomyopathy ([place])    Diagnosed by echo and ECG 11/13  . Atypical atrial flutter ([place])   . CAD (coronary artery disease)    a. LHC in 10/2011 demonstrated non-obstructive disease and an 80% lesion in a small D1 treated medically.   . CHF (congestive heart failure) ([place])   . Chronic diastolic heart failure ([place])   . CKD (chronic kidney disease), stage III ([place])   . COPD (chronic obstructive pulmonary disease) ([place])   . Dizziness    had PT for being off balance - in approx. 2012  . GERD (gastroesophageal reflux disease)   . Hyperlipidemia   . Hypertension   . Hypothyroid   . Lumbar disc disease   . Macular degeneration   . OSA (obstructive sleep apnea)   . Persistent atrial fibrillation ([place])       . Pulmonary hypertension ([place])    a. [place] 05/2013 showed mild PAH with normal PCWP and RA pressure, could be related to OSA and low oxygen saturation.   . Refusal of blood transfusions as patient is Jehovah's Witness   . Sinus bradycardia     Patient Active Problem List   Diagnosis Date Noted  . Chronic respiratory failure with hypoxia ([place]) 05/29/2017  . Paroxysmal atrial fibrillation with rapid ventricular  response ([place]) 10/01/2016  . PAF (paroxysmal atrial fibrillation) ([place]) 10/01/2016  . Dyspnea   . Pulmonary hypertension, primary ([place])   . Elevated troponin   . NSTEMI (non-ST elevated myocardial infarction) ([place]) 10/29/2014  . Chest pain 10/28/2014  . Atrial flutter with RVR 10/28/2014  . Morbid obesity due to excess calories ([place]) 07/19/2014  . Chronic anticoagulation 07/19/2014  . CAD (coronary artery disease) 07/19/2014  . Chronic renal insufficiency, stage III (moderate) ([place]) 07/19/2014  . Dyslipidemia 04/12/2014  . Hypoxia 04/25/2013  . OSA (obstructive sleep apnea) 04/13/2013  . Apical variant hypertrophic cardiomyopathy ([place]) 03/05/2012  . Chronic diastolic heart failure ([place])   . Persistent atrial fibrillation ([place])   . Sinus bradycardia   . Asthmatic bronchitis , chronic ([place]) 03/04/2012  . Hypothyroidism 03/04/2012  . Essential hypertension 03/04/2012  . DOE (dyspnea on exertion) 12/24/2010    Past Surgical History:  Procedure Laterality Date  . CARDIAC CATHETERIZATION     normal coronary arteries  . CARDIAC CATHETERIZATION N/A 10/31/2014   Procedure: Left Heart Cath and Coronary Angiography;  Surgeon: Leonie Man, MD;  Location: [facility] CV LAB;  Service: Cardiovascular;  Laterality: N/A;  . CATARACT EXTRACTION Bilateral   . CESAREAN SECTION  1983  . ELECTROPHYSIOLOGIC STUDY  N/A 04/24/2015   Procedure: Atrial Fibrillation Ablation;  Surgeon: [name], MD;  Location: [facility] CV LAB;  Service: Cardiovascular;  Laterality: N/A;  . RIGHT HEART CATH N/A 06/25/2016   Procedure: Right Heart Cath;  Surgeon: Sherren Mocha, MD;  Location: [facility] LAB;  Service: Cardiovascular;  Laterality: N/A;  . RIGHT HEART CATH N/A 06/10/2017   Procedure: RIGHT HEART CATH;  Surgeon: Larey Dresser, MD;  Location: [facility] LAB;  Service: Cardiovascular;  Laterality: N/A;  . RIGHT HEART CATHETERIZATION N/A 06/01/2013   Procedure: RIGHT HEART CATH;  Surgeon: Larey Dresser, MD;  Location: [facility] C[facility] LAB;  Service: Cardiovascular;  Laterality: N/A;  . TEE WITHOUT CARDIOVERSION N/A 04/23/2015   Procedure: TRANSESOPHAGEAL ECHOCARDIOGRAM (TEE);  Surgeon: Josue Hector, MD;  Location: [facility] ENDOSCOPY;  Service: Cardiovascular;  Laterality: N/A;     OB History   None      Home Medications    Prior to Admission medications   Medication Sig Start Date End Date Taking? Authorizing Provider  acetaminophen (TYLENOL) 325 MG tablet Take 2 tablets (650 mg total) by mouth every 4 (four) hours as needed for headache or mild pain. 10/02/16  Yes Kilroy, Luke K, PA-C  atorvastatin (LIPITOR) 80 MG tablet Take 1 tablet (80 mg total) by mouth daily with lunch. 02/08/15  Yes Sherran Needs, NP  Cholecalciferol (VITAMIN D) 2000 UNITS CAPS Take 2,000 Units by mouth daily.    Yes [provider]  colchicine 0.6 MG tablet Take 0.6 mg by mouth daily.    Yes [provider]  cyclobenzaprine (FLEXERIL) 5 MG tablet Take 1 tablet (5 mg total) by mouth 3 (three) times daily as needed for muscle spasms. 06/26/16  Yes Geradine Girt, DO  dronedarone (MULTAQ) 400 MG tablet Take 1 tablet (400 mg total) by mouth 2 (two) times daily with a meal. Please make overdue appt with Dr. Radford Pax. 1st attempt 09/11/17  Yes Turner, Eber Hong, MD  ezetimibe (ZETIA) 10 MG tablet Take 1 tablet (10 mg total) by mouth daily. Please make overdue appt with Dr. Radford Pax before anymore refills. 1st attempt 09/11/17  Yes Turner, Eber Hong, MD  furosemide (LASIX) 20 MG tablet Take 1 tablet (20 mg total) by mouth daily. 08/13/16  Yes Larey Dresser, MD  levothyroxine (SYNTHROID, LEVOTHROID) 50 MCG tablet Take 50 mcg by mouth daily before breakfast.   Yes [provider]  Macitentan (OPSUMIT) 10 MG TABS Take 1 tablet (10 mg total) by mouth daily. 06/29/17  Yes Larey Dresser, MD  metoprolol tartrate (LOPRESSOR) 25 MG tablet Take 1 tablet (25 mg total) by mouth 2 (two) times daily. Patient taking  differently: Take 25 mg by mouth as needed.  04/30/17  Yes Larey Dresser, MD  Multiple Vitamin (MULTIVITAMIN) capsule Take 1 capsule by mouth daily.   Yes [provider]  mupirocin ointment (BACTROBAN) 2 % Place 1 application into the nose every other day.   Yes [provider]  pantoprazole (PROTONIX) 40 MG tablet Take 1 tablet (40 mg total) by mouth daily. 08/05/17  Yes Larey Dresser, MD  potassium chloride SA (K-DUR,KLOR-CON) 20 MEQ tablet Take 0.5 tablets (10 mEq total) by mouth at bedtime. 10/02/16  Yes Kilroy, Doreene Burke, PA-C  predniSONE (DELTASONE) 10 MG tablet Take 2 tablets (20 mg total) by mouth daily with breakfast. 09/02/17  Yes Parrett, Tammy S, NP  Selexipag (UPTRAVI) 200 MCG TABS 1445mg in the AM and 16054m in  the PM   Yes [provider]  traMADol (ULTRAM) 50 MG tablet Take 50 mg by mouth every 8 (eight) hours as needed for moderate pain.   Yes [provider]  UNABLE TO FIND Med Name: CPAP  [facility]   Yes [provider]  XARELTO 15 MG TABS tablet TAKE 1 TABLET BY MOUTH EVERY DAY WITH SUPPER 09/02/17  Yes Larey Dresser, MD  ipratropium-albuterol (DUONEB) 0.5-2.5 (3) MG/3ML SOLN Take 3 mLs by nebulization 3 (three) times daily. DX: J44.9 Patient not taking: Reported on 10/06/2017 07/03/17   Juanito Doom, MD  mycophenolate (CELLCEPT) 500 MG tablet Take 2 tablets (1,000 mg total) by mouth 2 (two) times daily. 09/02/17   Parrett, Fonnie Mu, NP  sulfamethoxazole-trimethoprim (BACTRIM DS) 800-160 MG tablet Take 1 tablet by mouth 3 (three) times a week. 09/02/17   Parrett, Fonnie Mu, NP    Family History Family History  Problem Relation Age of Onset  . Heart disease Sister        Good Pastures Syndrome  . Gout Father   . Lung cancer Father        smoked  . Breast cancer Paternal Aunt     Social History Social History   Tobacco Use  . Smoking status: Former Smoker    Packs/day: 0.50    Years: 44.00    Pack years: 22.00    Types:  Cigarettes    Last attempt to quit: 04/07/2009    Years since quitting: 8.5  . Smokeless tobacco: Never Used  Substance Use Topics  . Alcohol use: No  . Drug use: No     Allergies   Amiodarone and Zyban [bupropion]   Review of Systems Review of Systems  All other systems reviewed and are negative.    Physical Exam Updated Vital Signs BP (!) 82/61 (BP Location: Right Arm)   Pulse (!) 54   Temp (!) 97.5 F (36.4 C) (Oral)   Resp 19   Ht 1.6 m (_0 )   Wt 88 kg (194 lb)   SpO2 97%   BMI 34.37 kg/m   Physical Exam  Constitutional: She appears well-developed and well-nourished. No distress.  HENT:  Head: Normocephalic and atraumatic.  Right Ear: External ear normal.  Left Ear: External ear normal.  Eyes: Conjunctivae are normal. Right eye exhibits no discharge. Left eye exhibits no discharge. No scleral icterus.  Neck: Neck supple. No tracheal deviation present.  Cardiovascular: Intact distal pulses. An irregularly irregular rhythm present. Tachycardia present.  Pulmonary/Chest: Effort normal and breath sounds normal. No stridor. No respiratory distress. She has no wheezes. She has no rales.  Abdominal: Soft. Bowel sounds are normal. She exhibits no distension. There is no tenderness. There is no rebound and no guarding.  Musculoskeletal: She exhibits edema. She exhibits no tenderness.  Neurological: She is alert. She has normal strength. No cranial nerve deficit (no facial droop, extraocular movements intact, no slurred speech) or sensory deficit. She exhibits normal muscle tone. She displays no seizure activity. Coordination normal.  Skin: Skin is warm and dry. No rash noted.  Psychiatric: She has a normal mood and affect.  Nursing note and vitals reviewed.    ED Treatments / Results  Labs (all labs ordered are listed, but only abnormal results are displayed) Labs Reviewed  BASIC METABOLIC PANEL - Abnormal; Notable for the following components:      Result Value     Chloride 113 (*)    BUN 30 (*)  Creatinine, Ser 1.74 (*)    Calcium 8.4 (*)    GFR calc non Af Amer 28 (*)    GFR calc Af Amer 33 (*)    All other components within normal limits  CBC - Abnormal; Notable for the following components:   RBC 3.81 (*)    Hemoglobin 11.4 (*)    RDW 20.0 (*)    All other components within normal limits  I-STAT TROPONIN, ED - Abnormal; Notable for the following components:   Troponin i, poc 0.11 (*)    All other components within normal limits  BRAIN NATRIURETIC PEPTIDE    EKG EKG Interpretation  Date/Time:  Tuesday October 06 2017 09:24:25 EDT Ventricular Rate:  138 PR Interval:    QRS Duration: 87 QT Interval:  336 QTC Calculation: 510 R Axis:   71 Text Interpretation:  Sinus tachycardia  vs flutter 2:1 Repolarization abnormality, prob rate related Prolonged QT interval Confirmed by Dorie Rank [phone]) on 10/06/2017 9:27:57 AM   Radiology Dg Chest Portable 1 View  Result Date: 10/06/2017 CLINICAL DATA:  Atrial fibrillation with rapid ventricular response over the past week associated with shortness of breath. History of CHF, COPD, pulmonary hypertension, former smoker. EXAM: PORTABLE CHEST 1 VIEW COMPARISON:  Chest x-ray of October 01, 2016 and CT scan of the chest of June 08, 2017. FINDINGS: The lungs are adequately inflated. The cardiac silhouette is enlarged. There is no pleural effusion. There is stable linear scarring in the periphery of the left mid to lower lung. There is calcification in the wall of the aortic arch. The trachea is midline. External pacemaker defibrillator pads are present. The bony structures are unremarkable. IMPRESSION: Enlargement of the cardiac silhouette without pulmonary vascular congestion or pulmonary edema. Thoracic aortic atherosclerosis. Electronically Signed   By: David  Martinique M.D.   On: 10/06/2017 10:12    Procedures .Sedation Date/Time: 10/06/2017 9:22 AM Performed by: Dorie Rank, MD Authorized by: Dorie Rank, MD    Consent:    Consent obtained:  Verbal   Consent given by:  Patient   Risks discussed:  Allergic reaction, dysrhythmia, inadequate sedation, nausea, prolonged hypoxia resulting in organ damage, prolonged sedation necessitating reversal, respiratory compromise necessitating ventilatory assistance and intubation and vomiting   Alternatives discussed:  Analgesia without sedation, anxiolysis and regional anesthesia Universal protocol:    Procedure explained and questions answered to patient or proxy's satisfaction: yes     Relevant documents present and verified: yes     Test results available and properly labeled: yes     Imaging studies available: yes     Required blood products, implants, devices, and special equipment available: yes     Site/side marked: yes     Immediately prior to procedure a time out was called: yes     Patient identity confirmation method:  Verbally with patient Indications:    Procedure necessitating sedation performed by:  Physician performing sedation   Intended level of sedation:  Deep Pre-sedation assessment:    Time since last food or drink:  10   ASA classification: class 3 - patient with severe systemic disease     Neck mobility: normal     Mouth opening:  3 or more finger widths   Thyromental distance:  4 finger widths   Mallampati score:  II - soft palate, uvula, fauces visible   Pre-sedation assessments completed and reviewed: airway patency, cardiovascular function, hydration status, mental status, nausea/vomiting, pain level, respiratory function and temperature     Pre-sedation assessment  completed:  10/06/2017 9:23 AM Immediate pre-procedure details:    Reassessment: Patient reassessed immediately prior to procedure     Reviewed: vital signs, relevant labs/tests and NPO status     Verified: bag valve mask available, emergency equipment available, intubation equipment available, IV patency confirmed, oxygen available and suction available   Procedure  details (see MAR for exact dosages):    Preoxygenation:  Nasal cannula   Sedation:  Propofol   Intra-procedure monitoring:  Blood pressure monitoring, cardiac monitor, continuous pulse oximetry, frequent LOC assessments, frequent vital sign checks and continuous capnometry   Intra-procedure events: none     Total Provider sedation time (minutes):  20 Post-procedure details:    Post-sedation assessment completed:  10/06/2017 10:58 AM   Attendance: Constant attendance by certified staff until patient recovered     Recovery: Patient returned to pre-procedure baseline     Post-sedation assessments completed and reviewed: airway patency, cardiovascular function, hydration status, mental status, nausea/vomiting, pain level, respiratory function and temperature     Patient is stable for discharge or admission: yes     Patient tolerance:  Tolerated well, no immediate complications  .Cardioversion Date/Time: 10/06/2017 10:58 AM Performed by: Dorie Rank, MD Authorized by: Dorie Rank, MD   Consent:    Consent obtained:  Written   Consent given by:  Patient   Risks discussed:  Cutaneous burn, death, induced arrhythmia and pain   Alternatives discussed:  Delayed treatment Pre-procedure details:    Cardioversion basis:  Emergent   Rhythm:  Atrial flutter   Electrode placement:  Anterior-posterior Patient sedated: Yes. Refer to sedation procedure documentation for details of sedation.  Attempt one:    Cardioversion mode:  Synchronous   Waveform:  Biphasic   Shock (Joules):  150   Shock outcome:  Conversion to normal sinus rhythm Post-procedure details:    Patient status:  Awake   Patient tolerance of procedure:  Tolerated well, no immediate complications .Critical Care Performed by: Dorie Rank, MD Authorized by: Dorie Rank, MD   Critical care provider statement:    Critical care time (minutes):  35   Critical care was time spent personally by me on the following activities:  Discussions with  consultants, evaluation of patient's response to treatment, examination of patient, ordering and performing treatments and interventions, ordering and review of laboratory studies, ordering and review of radiographic studies, pulse oximetry, re-evaluation of patient's condition, obtaining history from patient or surrogate and review of old charts   (including critical care time)  Medications Ordered in ED Medications  propofol (DIPRIVAN) 10 mg/mL bolus/IV push 44 mg (has no administration in time range)  propofol (DIPRIVAN) 10 mg/mL bolus/IV push (44.4 mg Intravenous Given 10/06/17 1036)     Initial Impression / Assessment and Plan / ED Course  I have reviewed the triage vital signs and the nursing notes.  Pertinent labs & imaging results that were available during my care of the patient were reviewed by me and considered in my medical decision making (see chart for details).  Clinical Course as of Oct 06 1098  Tue Oct 06, 2017  0925 Patient has history of paroxysmal atrial fibrillation.  Usually she does not remain in atrial fibrillation.  Patient is A. fib with a rapid rate today.  Her blood pressure is low in the 90s at the bedside.  She is appropriately anticoagulated.  I think is reasonable to try cardioversion.  Patient agrees   [JK]  0951 EKG does not look like a fib.  Flutter  vs sinus tach   [JK]  1058 Patient still hypotensive after converting to sinus rhythm.  Will give a small fluid bolus although she does appear to have some pulmonary edema   [JK]  1059 Labs notable for elevated troponin.  BNP is still in process.  Chest x-ray does show some evidence of edema.  I suspect this was related to her persistent atrial flutter.  I will consult with the cardiology service   [JK]    Clinical Course User Index [JK] Dorie Rank, MD    Patient presented to the emergency room for evaluation of persistent tachycardia.  Rhythm appeared to be atrial flutter with a 2-1 block.  Patient  underwent cardioversion in the ED.  She tolerated this well.  She appears to be back in a sinus rhythm.'s are notable for findings consistent with CHF.  She does have an elevated troponin.  I suspect this is likely related to her CHF.  She still does have have some persistent hypotension.  I will give her a small fluid bolus.  I will consult with cardiology as I think she will need be admitted for further monitoring and evaluation.  Final Clinical Impressions(s) / ED Diagnoses   Final diagnoses:  Atrial flutter with rapid ventricular response (HCC)  Congestive heart failure, unspecified HF chronicity, unspecified heart failure type ([place])  Elevated troponin       Dorie Rank, MD 10/06/17 1102  Pt was seen by cardiology.  She has been monitored in the ED for several hours.  Her symptoms are improving.  Inpatient vs outpatient treatment was offered .   Pt would prefer to go home.  Dc at this time.   Dorie Rank, MD 10/06/17 [phone]

## 2017-10-06 NOTE — ED Triage Notes (Signed)
From home via EMS; pt wears fitbit to monitor HR, has been reading 130's -140's x 1 week, pt prescribed 25 lopressor prn, pt states it hasn't been working; took Chart[employer] certified [employer] at [employer] w/ no improvement; hx chf; no cp, no increased sob; wears 4L O2 at home all the time; pt has noticed increased swelling in abd, ankles, feet x 1 week; been seeing rheumatologist who prescribed prednisone - has been taking prednisone x 1 week; pt allergic to amiodarone - causes cardiac toxicity  HR 136 BP 109/70 99% 5 L CBG 128 RR 20

## 2017-10-06 NOTE — Consult Note (Addendum)
Consult Note     Patient ID: Amy [name] MRN: [id number], DOB/AGE: [birth year]   Admit date: 10/06/2017 Primary Physician: Aretta Nip, MD Primary Cardiologist: Dr. Fransico Him, MD/Dr. [name], MD  Patient Profile    Amy [name] is a 72 year old female with a history of COPD, OSA on CPAP, apical hypertrophic cardiomyopathy, chronic diastolic CHF (Y8MV), pulmonary hypertension and paroxysmal atrial fibrillation (s/p failed ablation 2017) on Xarelto who is being admitted today for symptomatic atrial fibrillation/flutter.   Past Medical History   Past Medical History:  Diagnosis Date  . Apical variant hypertrophic cardiomyopathy ([place])    Diagnosed by echo and ECG 11/13  . Atypical atrial flutter ([place])   . CAD (coronary artery disease)    a. LHC in 10/2011 demonstrated non-obstructive disease and an 80% lesion in a small D1 treated medically.   . CHF (congestive heart failure) ([place])   . Chronic diastolic heart failure ([place])   . CKD (chronic kidney disease), stage III ([place])   . COPD (chronic obstructive pulmonary disease) ([place])   . Dizziness    had PT for being off balance - in approx. 2012  . GERD (gastroesophageal reflux disease)   . Hyperlipidemia   . Hypertension   . Hypothyroid   . Lumbar disc disease   . Macular degeneration   . OSA (obstructive sleep apnea)   . Persistent atrial fibrillation ([place])       . Pulmonary hypertension ([place])    a. [place] 05/2013 showed mild PAH with normal PCWP and RA pressure, could be related to OSA and low oxygen saturation.   . Refusal of blood transfusions as patient is Jehovah's Witness   . Sinus bradycardia     Past Surgical History:  Procedure Laterality Date  . CARDIAC CATHETERIZATION     normal coronary arteries  . CARDIAC CATHETERIZATION N/A 10/31/2014   Procedure: Left Heart Cath and Coronary Angiography;  Surgeon: Leonie Man, MD;  Location: [place] [facility];  Service: Cardiovascular;  Laterality: N/A;   . CATARACT EXTRACTION Bilateral   . CESAREAN SECTION  1983  . ELECTROPHYSIOLOGIC STUDY N/A 04/24/2015   Procedure: Atrial Fibrillation Ablation;  Surgeon: [name], MD;  Location: [place] [facility];  Service: Cardiovascular;  Laterality: N/A;  . RIGHT HEART CATH N/A 06/25/2016   Procedure: Right Heart Cath;  Surgeon: Sherren Mocha, MD;  Location: [place] [facility];  Service: Cardiovascular;  Laterality: N/A;  . RIGHT HEART CATH N/A 06/10/2017   Procedure: RIGHT HEART CATH;  Surgeon: Larey Dresser, MD;  Location: [place] [facility];  Service: Cardiovascular;  Laterality: N/A;  . RIGHT HEART CATHETERIZATION N/A 06/01/2013   Procedure: RIGHT HEART CATH;  Surgeon: Larey Dresser, MD;  Location: [facility] CATH LAB;  Service: Cardiovascular;  Laterality: N/A;  . TEE WITHOUT CARDIOVERSION N/A 04/23/2015   Procedure: TRANSESOPHAGEAL ECHOCARDIOGRAM (TEE);  Surgeon: Josue Hector, MD;  Location: [facility] ENDOSCOPY;  Service: Cardiovascular;  Laterality: N/A;    Allergies  Allergies  Allergen Reactions  . Amiodarone     Dyspnea - felt to be 2/2 amio lung toxicity Has tolerated Omnipaque   . Zyban [Bupropion] Itching   History of Present Illness    Amy [name] is a 72 year old female with a history stated above who presented to [facility] on 10/06/2017 with complaints of more frequent atrial fibrillation over the last several weeks. She states that she was recently started on autoimmune medication secondary to severe PAH and positive [name]. Unfortunately, she  did not tolerate this well and was told to stop the medicine per Pulmonary Medicine until she is seen by Rheumatology. In the meantime, she has been noticing more frequent and longer duration of her atrial fibrillation. Until recently, her AF was controlled with PRN metoprolol without complication. She has been having to take more frequent PRN doses without resolution, stating that this would temporarily lower her rate however, it would return to a rate  in the  120s. She had been previously been taken off of her Metoprolol earlier in the year secondary to bradycardia. Per telephone encounter, she was to try a scheduled dose of metoprolol 1/2 tab twice daily to see if that would help her rates and then call back to the office Monday (yesterday) from an appointment. Given her persistent AF, she called EMS or transport to [facility] for further evaluation. She denies chest pain, orthopnea, LE swelling. She has baseline SOB and dyspnea for which she is on supplemental home O2 and is followed by Dr. Aundra Dubin and Dr. Lake Bells for known [place]. She states that she has been taking her medications as prescribed and has had no diet indiscretions. She is intolerant to Amiodarone.   In the ED, she was noted to be in atrial flutter with 2:1 block for which she underwent an electrical cardioversion by EDP which was successful with biphasic synchronous shock at 150J to NSR. Her BP was low normal but stable. She was given a IV fluids with response.  A troponin level was drawn which was elevated at 0.11, 0.10.  A BNP was elevated at 675. CXR with evidence of enlargement of the cardiac silhouette with pulmonary vascular congestion, pulmonary edema.  Cardiology was asked to consult given the above symptoms.  Of note, she was last seen by Dr. Aundra Dubin on 08/19/2017 for follow-up for CHF and pulmonary hypertension. Prior to that, she was admitted in 06/2016 with CHF and dyspnea and found to have pulmonary arterial hypertension per RHC.  She has a long history of PAF and had been on Amiodarone suspected amiodarone toxicity, now on Multaq, however has had reports of breakthrough fibrillation. She has had an atrial fibrillation ablation in 2017 and has known apical hypertrophic cardiomyopathy per cardiac MRI with a consistent EKG. at this particular visit, she was noted to have progressive shortness of breath despite selective pulmonary vasodilators for [facility] treatment.  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  Additionally, she is followed by Dr. Lake Bells with pulmonary medicine office on 09/17/2017 for pulmonary hypertension management. She had been recently started on CellCept, prednisone and Bactrim with unfortunate significant side effects. At this appointment, her CellCept was held with consideration for restarting CellCept with Bactrim at lower dose with a slow titration to see if it is more tolerable. Given a positive ANCA she was also referred back to rheumatology for further work-up and medication management.  Home Medications    Prior to Admission medications   Medication Sig Start Date End Date Taking? Authorizing Provider  acetaminophen (TYLENOL) 325 MG tablet Take 2 tablets (650 mg total) by mouth every 4 (four) hours as needed for headache or mild pain. 10/02/16  Yes Kilroy, Luke K, PA-C  atorvastatin (LIPITOR) 80 MG tablet Take 1 tablet (80 mg total) by mouth daily with lunch. 02/08/15  Yes Sherran Needs, NP  Cholecalciferol (VITAMIN D) 2000 UNITS CAPS Take 2,000 Units by mouth daily.    Yes [provider]  colchicine 0.6 MG tablet Take  0.6 mg by mouth daily.    Yes [provider]  cyclobenzaprine (FLEXERIL) 5 MG tablet Take 1 tablet (5 mg total) by mouth 3 (three) times daily as needed for muscle spasms. 06/26/16  Yes Geradine Girt, DO  dronedarone (MULTAQ) 400 MG tablet Take 1 tablet (400 mg total) by mouth 2 (two) times daily with a meal. Please make overdue appt with Dr. Radford Pax. 1st attempt 09/11/17  Yes Turner, Eber Hong, MD  ezetimibe (ZETIA) 10 MG tablet Take 1 tablet (10 mg total) by mouth daily. Please make overdue appt with Dr. Radford Pax before anymore refills. 1st attempt 09/11/17  Yes Turner, Eber Hong, MD  furosemide (LASIX) 20 MG tablet Take 1 tablet (20 mg total) by mouth daily. 08/13/16  Yes Larey Dresser, MD  levothyroxine (SYNTHROID, LEVOTHROID) 50 MCG tablet Take 50 mcg by mouth daily before breakfast.    Yes [provider]  Macitentan (OPSUMIT) 10 MG TABS Take 1 tablet (10 mg total) by mouth daily. 06/29/17  Yes Larey Dresser, MD  metoprolol tartrate (LOPRESSOR) 25 MG tablet Take 1 tablet (25 mg total) by mouth 2 (two) times daily. Patient taking differently: Take 25 mg by mouth as needed.  04/30/17  Yes Larey Dresser, MD  Multiple Vitamin (MULTIVITAMIN) capsule Take 1 capsule by mouth daily.   Yes [provider]  mupirocin ointment (BACTROBAN) 2 % Place 1 application into the nose every other day.   Yes [provider]  pantoprazole (PROTONIX) 40 MG tablet Take 1 tablet (40 mg total) by mouth daily. 08/05/17  Yes Larey Dresser, MD  potassium chloride SA (K-DUR,KLOR-CON) 20 MEQ tablet Take 0.5 tablets (10 mEq total) by mouth at bedtime. 10/02/16  Yes Kilroy, Doreene Burke, PA-C  predniSONE (DELTASONE) 10 MG tablet Take 2 tablets (20 mg total) by mouth daily with breakfast. 09/02/17  Yes Parrett, Tammy S, NP  Selexipag (UPTRAVI) 200 MCG TABS 1415mg in the AM and 16055m in the PM   Yes [provider]  traMADol (ULTRAM) 50 MG tablet Take 50 mg by mouth every 8 (eight) hours as needed for moderate pain.   Yes [provider]  UNABLE TO FIND Med Name: CPAP  D[facility]   Yes [provider]  XARELTO 15 MG TABS tablet TAKE 1 TABLET BY MOUTH EVERY DAY WITH SUPPER 09/02/17  Yes McLarey DresserMD  ipratropium-albuterol (DUONEB) 0.5-2.5 (3) MG/3ML SOLN Take 3 mLs by nebulization 3 (three) times daily. DX: J44.9 Patient not taking: Reported on 10/06/2017 07/03/17   McJuanito DoomMD  mycophenolate (CELLCEPT) 500 MG tablet Take 2 tablets (1,000 mg total) by mouth 2 (two) times daily. 09/02/17   Parrett, TaFonnie MuNP  sulfamethoxazole-trimethoprim (BACTRIM DS) 800-160 MG tablet Take 1 tablet by mouth 3 (three) times a week. 09/02/17   Parrett, TaFonnie MuNP    Family History    Family History  Problem Relation Age of Onset  . Heart disease Sister         Good Pastures Syndrome  . Gout Father   . Lung cancer Father        smoked  . Breast cancer Paternal Aunt     Social History    Social History   Socioeconomic History  . Marital status: Widowed    Spouse name: divorced.  . Number of children: 1  . Years of education: Not on file  . Highest education level: Not on file  Occupational History  . Occupation: retired.  still works for [employer].   Social Needs  . Financial resource strain: Not on file  . Food insecurity:    Worry: Not on file    Inability: Not on file  . Transportation needs:    Medical: Not on file    Non-medical: Not on file  Tobacco Use  . Smoking status: Former Smoker    Packs/day: 0.50    Years: 44.00    Pack years: 22.00    Types: Cigarettes    Last attempt to quit: 04/07/2009    Years since quitting: 8.5  . Smokeless tobacco: Never Used  Substance and Sexual Activity  . Alcohol use: No  . Drug use: No  . Sexual activity: Never  Lifestyle  . Physical activity:    Days per week: Not on file    Minutes per session: Not on file  . Stress: Not on file  Relationships  . Social connections:    Talks on phone: Not on file    Gets together: Not on file    Attends religious service: Not on file    Active member of club or organization: Not on file    Attends meetings of clubs or organizations: Not on file    Relationship status: Not on file  . Intimate partner violence:    Fear of current or ex partner: Not on file    Emotionally abused: Not on file    Physically abused: Not on file    Forced sexual activity: Not on file  Other Topics Concern  . Not on file  Social History Narrative   Pt lives alone with 2 dogs.      Review of Systems   Please see HPI  All other systems reviewed and are otherwise negative except as noted above.  Physical Exam    Blood pressure (!) 111/99, pulse 93, temperature (!) 97.5 F (36.4 C), temperature source Oral, resp. rate 15, height _0  (1.6 m), weight 194 lb  (88 kg), SpO2 93 %.   General: Well developed, well nourished, NAD Skin: Warm, dry, intact  Head: Normocephalic, atraumatic, sclera non-icteric, no xanthomas, clear, moist mucus membranes. Neck: Negative for carotid bruits. No JVD Lungs:Clear to ausculation bilaterally. No wheezes, rales, or rhonchi. Breathing is unlabored. Cardiovascular: RRR with S1 S2. No murmurs, rubs, gallops, or LV heave appreciated. Abdomen: Soft, non-tender, non-distended with normoactive bowel sounds. No hepatomegaly, No rebound/guarding. No obvious abdominal masses. MSK: Strength and tone appear normal for age. 5/5 in all extremities Extremities: No edema. No clubbing or cyanosis. DP/PT pulses 2+ bilaterally Neuro: Alert and oriented. No focal deficits. No facial asymmetry. MAE spontaneously. Psych: Responds to questions appropriately with normal affect.    Labs    Troponin (Point of Care Test) Recent Labs    10/06/17 1011  TROPIPOC 0.11*   No results for input(s): CKTOTAL, CKMB, TROPONINI in the last 72 hours. Lab Results  Component Value Date   WBC 6.8 10/06/2017   HGB 11.4 (L) 10/06/2017   HCT 36.8 10/06/2017   MCV 96.6 10/06/2017   PLT 257 10/06/2017    Recent Labs  Lab 10/06/17 0925  NA 141  K 4.7  CL 113*  CO2 22  BUN 30*  CREATININE 1.74*  CALCIUM 8.4*  GLUCOSE 83   Lab Results  Component Value Date   CHOL 105 09/12/2016   HDL 41 09/12/2016   LDLCALC 49 09/12/2016   TRIG 76 09/12/2016   Lab Results  Component Value Date   DDIMER <0.27  06/23/2016     Radiology Studies    Dg Chest Portable 1 View  Result Date: 10/06/2017 CLINICAL DATA:  Atrial fibrillation with rapid ventricular response over the past week associated with shortness of breath. History of CHF, COPD, pulmonary hypertension, former smoker. EXAM: PORTABLE CHEST 1 VIEW COMPARISON:  Chest x-ray of October 01, 2016 and CT scan of the chest of June 08, 2017. FINDINGS: The lungs are adequately inflated. The cardiac  silhouette is enlarged. There is no pleural effusion. There is stable linear scarring in the periphery of the left mid to lower lung. There is calcification in the wall of the aortic arch. The trachea is midline. External pacemaker defibrillator pads are present. The bony structures are unremarkable. IMPRESSION: Enlargement of the cardiac silhouette without pulmonary vascular congestion or pulmonary edema. Thoracic aortic atherosclerosis. Electronically Signed   By: David  Martinique M.D.   On: 10/06/2017 10:12    ECG & Cardiac Imaging    Echocardiogram with bubble study: 09/08/17: Study Conclusions  - Left ventricle: The cavity size was normal. Wall thickness was   increased in a pattern of moderate LVH. Thickening of the LV apex   consistent with apical variant HCM. Systolic function was   vigorous. The estimated ejection fraction was in the range of 65%   to 70%. Wall motion was normal; there were no regional wall   motion abnormalities. Doppler parameters are consistent with   restrictive left ventricular relaxation (grade 3 diastolic   dysfunction). The E/A ratio is >2. The E/e&' ratio is >20,   suggesting markedly elevated LV filling pressure. - Mitral valve: Calcified annulus. Mildly thickened leaflets .   There was mild regurgitation. - Left atrium: Severely dilated. - Atrial septum: Septum bows left to right, suggesting high LA   pressure. Negative for PFO or late shunting by saline microbubble   contrast. - Pulmonary arteries: PA peak pressure: 42 mm Hg (S). - Pericardium, extracardiac: A trivial pericardial effusion was   identified posterior to the heart. Features were not consistent   with tamponade physiology.  Impressions:  - Compared with a prior study in 06/2017, the LVEF is unchanged. The   degree of diastolic dysfunction is worse with a higher LV filling   pressure.   Right and Left heart catheterization 06/10/17: 1. Mild to moderate pulmonary arterial  hypertension, absolute numbers are lower than on RHC in 3/18.  PVR remains elevated.  2. Low cardiac output.  CO was low on 3/18 cath but today's numbers appear worse.  3. Normal filling pressures.   Is cardiac output low solely due to pulmonary hypertension? Surprisingly RA pressure is not elevated.   - Will get echo today.  - Continue titration of selexipag upwards. ?Consider IV [place].  - She is going to see pulmonary in a couple of weeks.   Assessment & Plan    1. Atrial fibrillation/atrial flutter with RVR s/p DCCV in ED 10/06/17: -Pt with a hx of PAF for several years who has undergone an ablation and multiple DCCV's. She has maintained fairly well controlled rates up until more recently when she has noticed more frequent episodes of AF with rates in the 120's after being started on autoimmune medications (which have since been stopped due to intolerance).  -Presenting EKG with atrial flutter. Due to compliance with her Xarelto, she underwent an electrical cardioversion in the ED to NSR on 10/06/17. She reports feeling much better and she her rates have been in the low 50's since this time.  Earlier this year, she was taken off of her daily Metoprolol secondary to bradycardia.  -Mild bump in Trop likely secondary to AF with RVR>>>denies chest pain of ACS symptoms  -Continue Multaq and PRN dose of Metoprolol -Continue Xarelto -Will follow OP closely>>>Dr. Aundra Dubin appointment 10/23/17, Dr. Lake Bells appointment 10/28/17 -If there are any concerns between now and then, she is to call our office or report to the ED     2. Chronic diastolic congestive heart failure: -Echocardiogram 05/2017 with EF 65 to 70%, normal RV size and systolic function.  She was continued on her Lasix 20 mg daily and potassium 10 mEq daily -Does not appear to be fluid volume overloaded on exam today despite CXR and BNP level -Will   3.  Apical hypertrophic cardiomyopathy: -Per cardiac MRI 10/2014 with mild apical LV  hypertrophy with vigorous contraction.  Metoprolol as needed for tachycardia/atrial fibrillation -Followed closely by Dr. Aundra Dubin   4.  Pulmonary hypertension: -Initial RHC in 06/2016 showed moderate pulmonary arterial hypertension -PFTs in 06/2016 showed minimal obstruction and restriction, there was significantly decreased DLCO correlating with pulmonary vascular disease. Has not improved with pulmonary vasodilators, in fact oxygen requirements have significantly increased>>> there was concern for pulmonary venoocclusive disease or pulmonary capillary hemangiomatosis based on response to [facility] treatment. High-resolution CT to assess for evidence of ILD with no severe abnormality detected. -Echo bubble study to assess for evidence of shunting>>negative   5.  Hypotension: -Stable, 110/94, 129/86, 105/71 -Patient given IV fluids -Asymptomatic     Signed, Kathyrn Drown NP-C [facility] Pager: [phone] 10/06/2017, _0

## 2017-10-07 ENCOUNTER — Telehealth (HOSPITAL_COMMUNITY): Payer: Self-pay | Admitting: *Deleted

## 2017-10-07 DIAGNOSIS — F0631 Mood disorder due to known physiological condition with depressive features: Secondary | ICD-10-CM | POA: Diagnosis not present

## 2017-10-07 NOTE — Telephone Encounter (Signed)
Pt dccv in ED, [facility] for pt to clbk to sched f/u appt

## 2017-10-13 ENCOUNTER — Encounter (HOSPITAL_COMMUNITY): Payer: Self-pay

## 2017-10-14 ENCOUNTER — Ambulatory Visit (HOSPITAL_COMMUNITY): Payer: Medicare Other | Admitting: Nurse Practitioner

## 2017-10-15 ENCOUNTER — Encounter (HOSPITAL_COMMUNITY): Payer: Self-pay

## 2017-10-20 ENCOUNTER — Ambulatory Visit (HOSPITAL_COMMUNITY)
Admission: RE | Admit: 2017-10-20 | Discharge: 2017-10-20 | Disposition: A | Discharge status: Home / Self Care | Payer: Medicare Other | Source: Ambulatory Visit | Attending: Nurse Practitioner | Admitting: Nurse Practitioner

## 2017-10-20 ENCOUNTER — Encounter (HOSPITAL_COMMUNITY): Payer: Self-pay | Admitting: Nurse Practitioner

## 2017-10-20 ENCOUNTER — Encounter (HOSPITAL_COMMUNITY): Payer: Self-pay

## 2017-10-20 VITALS — BP 122/64 | HR 59 | Ht 63.0 in | Wt 191.0 lb

## 2017-10-20 DIAGNOSIS — N183 Chronic kidney disease, stage 3 (moderate): Secondary | ICD-10-CM | POA: Diagnosis not present

## 2017-10-20 DIAGNOSIS — I422 Other hypertrophic cardiomyopathy: Secondary | ICD-10-CM | POA: Insufficient documentation

## 2017-10-20 DIAGNOSIS — Z7901 Long term (current) use of anticoagulants: Secondary | ICD-10-CM | POA: Insufficient documentation

## 2017-10-20 DIAGNOSIS — I272 Pulmonary hypertension, unspecified: Secondary | ICD-10-CM | POA: Diagnosis not present

## 2017-10-20 DIAGNOSIS — H3530 Unspecified macular degeneration: Secondary | ICD-10-CM | POA: Insufficient documentation

## 2017-10-20 DIAGNOSIS — Z7952 Long term (current) use of systemic steroids: Secondary | ICD-10-CM | POA: Insufficient documentation

## 2017-10-20 DIAGNOSIS — I4891 Unspecified atrial fibrillation: Secondary | ICD-10-CM | POA: Diagnosis present

## 2017-10-20 DIAGNOSIS — E039 Hypothyroidism, unspecified: Secondary | ICD-10-CM | POA: Insufficient documentation

## 2017-10-20 DIAGNOSIS — I270 Primary pulmonary hypertension: Secondary | ICD-10-CM

## 2017-10-20 DIAGNOSIS — G4733 Obstructive sleep apnea (adult) (pediatric): Secondary | ICD-10-CM | POA: Insufficient documentation

## 2017-10-20 DIAGNOSIS — I484 Atypical atrial flutter: Secondary | ICD-10-CM | POA: Diagnosis not present

## 2017-10-20 DIAGNOSIS — Z79899 Other long term (current) drug therapy: Secondary | ICD-10-CM | POA: Diagnosis not present

## 2017-10-20 DIAGNOSIS — I481 Persistent atrial fibrillation: Secondary | ICD-10-CM | POA: Diagnosis not present

## 2017-10-20 DIAGNOSIS — Z9889 Other specified postprocedural states: Secondary | ICD-10-CM | POA: Diagnosis not present

## 2017-10-20 DIAGNOSIS — Z87891 Personal history of nicotine dependence: Secondary | ICD-10-CM | POA: Insufficient documentation

## 2017-10-20 DIAGNOSIS — I130 Hypertensive heart and chronic kidney disease with heart failure and stage 1 through stage 4 chronic kidney disease, or unspecified chronic kidney disease: Secondary | ICD-10-CM | POA: Insufficient documentation

## 2017-10-20 DIAGNOSIS — I5032 Chronic diastolic (congestive) heart failure: Secondary | ICD-10-CM | POA: Insufficient documentation

## 2017-10-20 DIAGNOSIS — E785 Hyperlipidemia, unspecified: Secondary | ICD-10-CM | POA: Insufficient documentation

## 2017-10-20 DIAGNOSIS — J449 Chronic obstructive pulmonary disease, unspecified: Secondary | ICD-10-CM | POA: Diagnosis not present

## 2017-10-20 DIAGNOSIS — I2510 Atherosclerotic heart disease of native coronary artery without angina pectoris: Secondary | ICD-10-CM | POA: Diagnosis not present

## 2017-10-20 DIAGNOSIS — Z79890 Hormone replacement therapy (postmenopausal): Secondary | ICD-10-CM | POA: Diagnosis not present

## 2017-10-20 DIAGNOSIS — K219 Gastro-esophageal reflux disease without esophagitis: Secondary | ICD-10-CM | POA: Diagnosis not present

## 2017-10-20 DIAGNOSIS — I4819 Other persistent atrial fibrillation: Secondary | ICD-10-CM

## 2017-10-20 NOTE — Patient Instructions (Signed)
You have been scheduled for 3 month follow up with Dr. Caryl Comes.    You are now taking metoprolol 25  Mg as needed for breakthrough fast heart rate and afib.  If you are having to take this often, you may take 12.5 mg twice a day.  Please contact our office if this change is made.

## 2017-10-20 NOTE — Progress Notes (Signed)
Primary Care Physician: Aretta Nip, MD Primary Cardiologist: Aundra Dubin Primary Electrophysiologist: Margret Chance is a 72 y.o. female with a history of persistent atrial fibrillation who presents for follow up in the [facility].  She underwent AF ablation by Dr Rayann Heman in 2017 and has done well maintained on multaq. She was recently started on meds including prednisone and has had recurrent symptomatic atrial fibrillation. She took prn metoprolol as instructed but her episodes kept returning with heart rates 140-160's.  She presented to the ER for evaluation and reverted to SR and was asked to follow up here today. Since being seen in the ER, she has done well without recurrent AF. She is not taking Metoprolol daily (takes just as needed for recurrent AF episodes). She has chronic shortness of breath with exertion and denies symptoms of chest pain, orthopnea, PND, presyncope, syncope, snoring, daytime somnolence, bleeding, or neurologic sequela. The patient is tolerating medications without difficulties and is otherwise without complaint today.    Past Medical History:  Diagnosis Date  . Apical variant hypertrophic cardiomyopathy ([place])    Diagnosed by echo and ECG 11/13  . Atypical atrial flutter ([place])   . CAD (coronary artery disease)    a. LHC in 10/2011 demonstrated non-obstructive disease and an 80% lesion in a small D1 treated medically.   . CHF (congestive heart failure) ([place])   . Chronic diastolic heart failure ([place])   . CKD (chronic kidney disease), stage III ([place])   . COPD (chronic obstructive pulmonary disease) ([place])   . Dizziness    had PT for being off balance - in approx. 2012  . GERD (gastroesophageal reflux disease)   . Hyperlipidemia   . Hypertension   . Hypothyroid   . Lumbar disc disease   . Macular degeneration   . OSA (obstructive sleep apnea)   . Persistent atrial fibrillation ([place])       . Pulmonary hypertension  ([place])    a. [place] 05/2013 showed mild PAH with normal PCWP and RA pressure, could be related to OSA and low oxygen saturation.   . Refusal of blood transfusions as patient is Jehovah's Witness   . Sinus bradycardia    Past Surgical History:  Procedure Laterality Date  . CARDIAC CATHETERIZATION     normal coronary arteries  . CARDIAC CATHETERIZATION N/A 10/31/2014   Procedure: Left Heart Cath and Coronary Angiography;  Surgeon: Leonie Man, MD;  Location: [facility];  Service: Cardiovascular;  Laterality: N/A;  . CATARACT EXTRACTION Bilateral   . CESAREAN SECTION  1983  . ELECTROPHYSIOLOGIC STUDY N/A 04/24/2015   Procedure: Atrial Fibrillation Ablation;  Surgeon: [name], MD;  Location: [facility];  Service: Cardiovascular;  Laterality: N/A;  . RIGHT HEART CATH N/A 06/25/2016   Procedure: Right Heart Cath;  Surgeon: Sherren Mocha, MD;  Location: [facility];  Service: Cardiovascular;  Laterality: N/A;  . RIGHT HEART CATH N/A 06/10/2017   Procedure: RIGHT HEART CATH;  Surgeon: Larey Dresser, MD;  Location: [facility];  Service: Cardiovascular;  Laterality: N/A;  . RIGHT HEART CATHETERIZATION N/A 06/01/2013   Procedure: RIGHT HEART CATH;  Surgeon: Larey Dresser, MD;  Location: [facility] CATH LAB;  Service: Cardiovascular;  Laterality: N/A;  . TEE WITHOUT CARDIOVERSION N/A 04/23/2015   Procedure: TRANSESOPHAGEAL ECHOCARDIOGRAM (TEE);  Surgeon: Josue Hector, MD;  Location: [facility] ENDOSCOPY;  Service: Cardiovascular;  Laterality: N/A;    Current  Outpatient Medications  Medication Sig Dispense Refill  . acetaminophen (TYLENOL) 325 MG tablet Take 2 tablets (650 mg total) by mouth every 4 (four) hours as needed for headache or mild pain.    Marland Kitchen atorvastatin (LIPITOR) 80 MG tablet Take 1 tablet (80 mg total) by mouth daily with lunch. 90 tablet 3  . Cholecalciferol (VITAMIN D) 2000 UNITS CAPS Take 2,000 Units by mouth daily.     . colchicine 0.6 MG tablet Take 0.6 mg by  mouth daily.     . cyclobenzaprine (FLEXERIL) 5 MG tablet Take 1 tablet (5 mg total) by mouth 3 (three) times daily as needed for muscle spasms. 10 tablet 0  . dronedarone (MULTAQ) 400 MG tablet Take 1 tablet (400 mg total) by mouth 2 (two) times daily with a meal. Please make overdue appt with Dr. Radford Pax. 1st attempt 60 tablet 0  . ezetimibe (ZETIA) 10 MG tablet Take 1 tablet (10 mg total) by mouth daily. Please make overdue appt with Dr. Radford Pax before anymore refills. 1st attempt 30 tablet 0  . furosemide (LASIX) 20 MG tablet Take 1 tablet (20 mg total) by mouth daily. 30 tablet 6  . levothyroxine (SYNTHROID, LEVOTHROID) 50 MCG tablet Take 50 mcg by mouth daily before breakfast.    . Macitentan (OPSUMIT) 10 MG TABS Take 1 tablet (10 mg total) by mouth daily. 30 tablet 11  . metoprolol tartrate (LOPRESSOR) 25 MG tablet Take 25 mg by mouth as needed. If having to take often for breakthrough, may take 12.5 twice a day.  Please contact office if change is made    . Multiple Vitamin (MULTIVITAMIN) capsule Take 1 capsule by mouth daily.    . mupirocin ointment (BACTROBAN) 2 % Place 1 application into the nose every other day.    . pantoprazole (PROTONIX) 40 MG tablet Take 1 tablet (40 mg total) by mouth daily. 90 tablet 2  . potassium chloride SA (K-DUR,KLOR-CON) 20 MEQ tablet Take 0.5 tablets (10 mEq total) by mouth at bedtime.    . predniSONE (DELTASONE) 10 MG tablet Take 2 tablets (20 mg total) by mouth daily with breakfast. 60 tablet 1  . Selexipag (UPTRAVI) 200 MCG TABS 1458mg in the AM and 16075m in the PM    . traMADol (ULTRAM) 50 MG tablet Take 50 mg by mouth every 8 (eight) hours as needed for moderate pain.    . Marland KitchenNABLE TO FIND Med Name: CPAP  [facility]    . XARELTO 15 MG TABS tablet TAKE 1 TABLET BY MOUTH EVERY DAY WITH SUPPER 30 tablet 11  . ipratropium-albuterol (DUONEB) 0.5-2.5 (3) MG/3ML SOLN Take 3 mLs by nebulization 3 (three) times daily. DX: J44.9 (Patient not taking: Reported on  10/06/2017) 360 mL 5   No current facility-administered medications for this encounter.     Allergies  Allergen Reactions  . Amiodarone     Dyspnea - felt to be 2/2 amio lung toxicity Has tolerated Omnipaque   . Zyban [Bupropion] Itching    Social History   Socioeconomic History  . Marital status: Widowed    Spouse name: divorced.  . Number of children: 1  . Years of education: Not on file  . Highest education level: Not on file  Occupational History  . Occupation: retired. still works for [employer]  Social Needs  . Financial resource strain: Not on file  . Food insecurity:    Worry: Not on file    Inability: Not on file  . Transportation needs:  Medical: Not on file    Non-medical: Not on file  Tobacco Use  . Smoking status: Former Smoker    Packs/day: 0.50    Years: 44.00    Pack years: 22.00    Types: Cigarettes    Last attempt to quit: 04/07/2009    Years since quitting: 8.5  . Smokeless tobacco: Never Used  Substance and Sexual Activity  . Alcohol use: No  . Drug use: No  . Sexual activity: Never  Lifestyle  . Physical activity:    Days per week: Not on file    Minutes per session: Not on file  . Stress: Not on file  Relationships  . Social connections:    Talks on phone: Not on file    Gets together: Not on file    Attends religious service: Not on file    Active member of club or organization: Not on file    Attends meetings of clubs or organizations: Not on file    Relationship status: Not on file  . Intimate partner violence:    Fear of current or ex partner: Not on file    Emotionally abused: Not on file    Physically abused: Not on file    Forced sexual activity: Not on file  Other Topics Concern  . Not on file  Social History Narrative   Pt lives alone with 2 dogs.     Family History  Problem Relation Age of Onset  . Heart disease Sister        Good Pastures Syndrome  . Gout Father   . Lung cancer Father        smoked  . Breast cancer  Paternal Aunt    The patient does not have a history of early familial atrial fibrillation or other arrhythmias.  ROS- All systems are reviewed and negative except as per the HPI above.  Physical Exam: Vitals:   10/20/17 1351  BP: 122/64  Pulse: (!) 59  SpO2: 90%  Weight: 191 lb (86.6 kg)  Height: 5' 3" (1.6 m)    GEN- The patient is chronically ill appearing, alert and oriented x 3 today.   Head- normocephalic, atraumatic Eyes-  Sclera clear, conjunctiva pink Ears- hearing intact Oropharynx- clear Neck- supple  Lungs- Clear to ausculation bilaterally, normal work of breathing Heart- Regular rate and rhythm  GI- soft, NT, ND, + BS Extremities- no clubbing, cyanosis, or edema MS- no significant deformity or atrophy Skin- no rash or lesion Psych- euthymic mood, full affect Neuro- strength and sensation are intact  Wt Readings from Last 3 Encounters:  10/20/17 191 lb (86.6 kg)  10/06/17 194 lb (88 kg)  09/17/17 194 lb (88 kg)    EKG today demonstrates sinus bradycardia, rate 59, normal intervals  Epic records are reviewed at length today  Assessment and Plan:  1. Persistent atrial fibrillation The patient has symptomatic atrial fibrillation. She is status post AF ablation in 2017. She had been doing well without recurrence until recently.  She has done well since recent ER visit. Continue Multaq and prn metoprolol. We discussed that she could take low dose scheduled metoprolol (12.97m twice daily) if AF episodes become more frequent. Would not use higher doses with baseline bradycardia.   2. COPD/pulmonary hypertension Per Dr McLean/pulmonary  Follow up with Dr KCaryl Comesin 3 months   AChanetta Marshall NP 10/20/2017 2:19 PM

## 2017-10-21 DIAGNOSIS — F0631 Mood disorder due to known physiological condition with depressive features: Secondary | ICD-10-CM | POA: Diagnosis not present

## 2017-10-22 ENCOUNTER — Encounter (HOSPITAL_COMMUNITY): Payer: Self-pay

## 2017-10-23 ENCOUNTER — Encounter (HOSPITAL_COMMUNITY): Payer: Self-pay | Admitting: Cardiology

## 2017-10-23 ENCOUNTER — Ambulatory Visit (HOSPITAL_COMMUNITY)
Admission: RE | Admit: 2017-10-23 | Discharge: 2017-10-23 | Disposition: A | Discharge status: Home / Self Care | Payer: Medicare Other | Source: Ambulatory Visit | Attending: Cardiology | Admitting: Cardiology

## 2017-10-23 ENCOUNTER — Other Ambulatory Visit: Payer: Self-pay

## 2017-10-23 VITALS — BP 108/56 | HR 57 | Wt 191.5 lb

## 2017-10-23 DIAGNOSIS — I422 Other hypertrophic cardiomyopathy: Secondary | ICD-10-CM | POA: Insufficient documentation

## 2017-10-23 DIAGNOSIS — I480 Paroxysmal atrial fibrillation: Secondary | ICD-10-CM | POA: Diagnosis not present

## 2017-10-23 DIAGNOSIS — Z79899 Other long term (current) drug therapy: Secondary | ICD-10-CM | POA: Diagnosis not present

## 2017-10-23 DIAGNOSIS — E039 Hypothyroidism, unspecified: Secondary | ICD-10-CM | POA: Diagnosis not present

## 2017-10-23 DIAGNOSIS — Z803 Family history of malignant neoplasm of breast: Secondary | ICD-10-CM | POA: Diagnosis not present

## 2017-10-23 DIAGNOSIS — Z7901 Long term (current) use of anticoagulants: Secondary | ICD-10-CM | POA: Diagnosis not present

## 2017-10-23 DIAGNOSIS — N183 Chronic kidney disease, stage 3 (moderate): Secondary | ICD-10-CM | POA: Insufficient documentation

## 2017-10-23 DIAGNOSIS — K219 Gastro-esophageal reflux disease without esophagitis: Secondary | ICD-10-CM | POA: Insufficient documentation

## 2017-10-23 DIAGNOSIS — I270 Primary pulmonary hypertension: Secondary | ICD-10-CM

## 2017-10-23 DIAGNOSIS — Z801 Family history of malignant neoplasm of trachea, bronchus and lung: Secondary | ICD-10-CM | POA: Diagnosis not present

## 2017-10-23 DIAGNOSIS — I2510 Atherosclerotic heart disease of native coronary artery without angina pectoris: Secondary | ICD-10-CM | POA: Diagnosis not present

## 2017-10-23 DIAGNOSIS — J449 Chronic obstructive pulmonary disease, unspecified: Secondary | ICD-10-CM | POA: Insufficient documentation

## 2017-10-23 DIAGNOSIS — Z79891 Long term (current) use of opiate analgesic: Secondary | ICD-10-CM | POA: Diagnosis not present

## 2017-10-23 DIAGNOSIS — I2721 Secondary pulmonary arterial hypertension: Secondary | ICD-10-CM | POA: Diagnosis not present

## 2017-10-23 DIAGNOSIS — I5032 Chronic diastolic (congestive) heart failure: Secondary | ICD-10-CM | POA: Insufficient documentation

## 2017-10-23 DIAGNOSIS — E785 Hyperlipidemia, unspecified: Secondary | ICD-10-CM | POA: Insufficient documentation

## 2017-10-23 DIAGNOSIS — G4733 Obstructive sleep apnea (adult) (pediatric): Secondary | ICD-10-CM | POA: Insufficient documentation

## 2017-10-23 DIAGNOSIS — Z87891 Personal history of nicotine dependence: Secondary | ICD-10-CM | POA: Insufficient documentation

## 2017-10-23 DIAGNOSIS — Z79890 Hormone replacement therapy (postmenopausal): Secondary | ICD-10-CM | POA: Insufficient documentation

## 2017-10-23 DIAGNOSIS — Z8249 Family history of ischemic heart disease and other diseases of the circulatory system: Secondary | ICD-10-CM | POA: Insufficient documentation

## 2017-10-23 LAB — BASIC METABOLIC PANEL
ANION GAP: 6 (ref 5–15)
BUN: 18 mg/dL (ref 8–23)
CO2: 22 mmol/L (ref 22–32)
Calcium: 9.1 mg/dL (ref 8.9–10.3)
Chloride: 110 mmol/L (ref 98–111)
Creatinine, Ser: 1.46 mg/dL — ABNORMAL HIGH (ref 0.44–1.00)
GFR, EST AFRICAN AMERICAN: 40 mL/min — AB (ref >60)
GFR, EST NON AFRICAN AMERICAN: 35 mL/min — AB (ref >60)
Glucose, Bld: 96 mg/dL (ref 70–99)
POTASSIUM: 4.6 mmol/L (ref 3.5–5.1)
Sodium: 138 mmol/L (ref 135–145)

## 2017-10-23 NOTE — Patient Instructions (Signed)
Routine lab work today. Will notify you of abnormal results, otherwise no news is good news!  No changes to medication at this time.  Follow up 2 months with Dr. Aundra Dubin.  ______________________________________________________________ Amy [name] Code:  2119  Take all medication as prescribed the day of your appointment. Bring all medications with you to your appointment.  Do the following things EVERYDAY: 1) Weigh yourself in the morning before breakfast. Write it down and keep it in a log. 2) Take your medicines as prescribed 3) Eat low salt foods-Limit salt (sodium) to 2000 mg per day.  4) Stay as active as you can everyday 5) Limit all fluids for the day to less than 2 liters

## 2017-10-25 NOTE — Progress Notes (Signed)
PCP: Dr. Radene Ou Cardiology: Dr. Radford Pax HF Cardiology: Dr. Aundra Dubin Pulmonary: Dr. Lake Bells  72 y.o. with history of COPD, OSA on CPAP, apical hypertrophic cardiomyopathy, chronic diastolic CHF, and paroxysmal atrial fibrillation returns for followup of CHF and pulmonary hypertension.  Patient was admitted in 3/18 with CHF and dyspnea, found to have pulmonary arterial hypertension by RHC.  Prior to this, she has had a long history of PAF.  She is currently taking dronedarone but has had breakthrough fibrillation.  She is in NSR today.  She had an atrial fibrillation ablation in the past. She has apical hypertrophic cardiomyopathy proven by cardiac MRI with a consistent ECG.   She is on Opsumit and has started Selexipag, now titrated up to 1400 mcg am/1600 mcg pm. She was unable to tolerate Adcirca.  She is doing pulmonary rehab. Symptomatically, she has been worse despite selective pulmonary vasodilators to treat PAH.  Oxygen requirement has increased to 3L at rest and 8L with exertion.  Dyspnea has worsened.   Because of these findings, I repeated her RHC in 3/19.  This showed lower PA pressure and normal right and left heart filling pressures, but more worrisomely, cardiac output was low.  However, on repeat of echo in 3/19, the RV actually looked normal in size and systolic function.  Given concern for possible PVOD as cause of her worsening symptoms on treatment, I ordered a high resolution CT chest.  This was degraded by respiratory artifact but showed no definite significant abnormalities.  I had her see Dr Lake Bells. He sent autoimmune serologies again, weak P-ANCA positivity so she was referred to rheumatology.  Echo bubble study was negative.  CCP and Ro-52 were positive.  Also of note, she has had atrial fibrillation ablation and pulmonary vein stenosis is a consideration.  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  She was started on Cellcept  but this was stopped due to joint pain which resolved after stopping it.  She was also on prednisone but stopped this also when she went into atrial fibrillation in 7/19 (DCCV back to NSR in ER).   She is using her CPAP.  Weight is down 4 lbs. She is in NSR today, no palpitations. She feels like she has had some improvement in her breathing with PH meds but is still very limited => short of breath walking around the house.  Prednisone also helped her breathing some but she has stopped it.  No orthopnea/PND.  She does not think she can do a 6 minute walk today due to foot pain (?gout).    Labs (2/18): TSH normal Labs (3/18): K 3.8, creatinine 1.26, BNP 424 Labs (5/18): K 4.4, creatinine 1.96, anti-SCL 70 negative, RF very slightly elevated, HIV negative.  Labs (6/18): K 4, creatinine 1.2, LDL 49 Labs (9/18): BNP 149, K 4, creatinine 1.4, BNP 149 Labs (2/19): BNP 126, K 3.8, creatinine 1.5, CCP+, Ro-52+, elevated CK.  Labs (7/19): BNP 676, K 4.7, creatinine 1.74  4/18 6 minute walk: 109 m  10/18 6 minute walk: 229 m 2/19 6 minute walk: < 100 m 5/19 6 minute walk: 107 m  ECG (personally reviewed): NSR, LVH with repolarization abnormality.   PMH:  1. COPD: Quit smoking 2011.  - PFTs (3/18): minimal obstruction, minimal restriction, DLCO 22% (severely decreased).  2. Hypothyroidism 3. Hyperlipidemia 4. OSA: Uses CPAP 5. Apical hypertrophic cardiomyopathy:  - Cardiac MRI (7/16) with EF 79%, mid-apical LV hypertrophy with  spade-like ventricle, mid-myocardial LGE was noted at the apex.   6. GERD 7. Atrial fibrillation: Paroxysmal.  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 Long QT interval so dofetilide and sotalol have not been used. She has had atrial fibrillation ablation.  - Currently on dronedarone.  - DCCV to NSR in ER 7/19.  8. Coronary artery disease: LHC (7/16) with nonobstructive disease + 85% ostial stenosis small D1.  9. Diastolic CHF: Echo (9/76) with EF 65-70%, apical hypertrophic  cardiomyopathy, severe LAE, PASP 86 mmHg.  - RHC (3/18): mean RA 4, PA 69/23 mean 38, mean PCWP 8, CI 2.17, PVR 7.3 WU Fick, 9.6 WU thermo.  - Echo (3/19): EF 65-70%, apical hypertrophic cardiomyopathy, normal RV size and systolic function, severe LAE.  - Echo (6/19): EF 65-70%, apical HCM, restrictive diastolic dysfunction, negative bubble study, PASP 42 mmHg.  10. Pulmonary hypertension: See RHC above.  - V/Q scan negative 3/18 - HIV, SCL70 negative.  RF borderline elevated, doubt significant.  ANA negative.  - Unable to tolerate Adcirca - RHC (3/19): mean RA 6, PA 49/16 mean 29, mean PCWP 10, CI 1.9/PVR 5.3 WU Fick, CI 1.4/PVR 7.3 Thermo.  - High resolution CT chest (3/19): respiratory motion artifact present, but suspect mild emphysema with lungs grossly clear.  - P-ANCA+, CCP+, RO-52+, CK elevated.  - Echo 6/19 with negative bubble study.  11. CPX (12/17): peak VO2 8.5, RER 0.96, VE/VCO2 slope 67. Submaximal but appears to show severe functional impairment.   12. CKD: Stage 3.   Social History   Socioeconomic History  . Marital status: Widowed    Spouse name: divorced.  . Number of children: 1  . Years of education: Not on file  . Highest education level: Not on file  Occupational History  . Occupation: retired. still works for [employer].   Social Needs  . Financial resource strain: Not on file  . Food insecurity:    Worry: Not on file    Inability: Not on file  . Transportation needs:    Medical: Not on file    Non-medical: Not on file  Tobacco Use  . Smoking status: Former Smoker    Packs/day: 0.50    Years: 44.00    Pack years: 22.00    Types: Cigarettes    Last attempt to quit: 04/07/2009    Years since quitting: 8.5  . Smokeless tobacco: Never Used  Substance and Sexual Activity  . Alcohol use: No  . Drug use: No  . Sexual activity: Never  Lifestyle  . Physical activity:    Days per week: Not on file    Minutes per session: Not on file  . Stress: Not on file   Relationships  . Social connections:    Talks on phone: Not on file    Gets together: Not on file    Attends religious service: Not on file    Active member of club or organization: Not on file    Attends meetings of clubs or organizations: Not on file    Relationship status: Not on file  . Intimate partner violence:    Fear of current or ex partner: Not on file    Emotionally abused: Not on file    Physically abused: Not on file    Forced sexual activity: Not on file  Other Topics Concern  . Not on file  Social History Narrative   Pt lives alone with 2 dogs.    Family History  Problem Relation Age of Onset  . Heart  disease Sister        Good Pastures Syndrome  . Gout Father   . Lung cancer Father        smoked  . Breast cancer Paternal Aunt    Review of systems complete and found to be negative unless listed in HPI.    Current Outpatient Medications  Medication Sig Dispense Refill  . acetaminophen (TYLENOL) 325 MG tablet Take 2 tablets (650 mg total) by mouth every 4 (four) hours as needed for headache or mild pain.    Marland Kitchen atorvastatin (LIPITOR) 80 MG tablet Take 1 tablet (80 mg total) by mouth daily with lunch. 90 tablet 3  . Cholecalciferol (VITAMIN D) 2000 UNITS CAPS Take 2,000 Units by mouth daily.     . colchicine 0.6 MG tablet Take 0.6 mg by mouth daily.     . cyclobenzaprine (FLEXERIL) 5 MG tablet Take 1 tablet (5 mg total) by mouth 3 (three) times daily as needed for muscle spasms. 10 tablet 0  . dronedarone (MULTAQ) 400 MG tablet Take 1 tablet (400 mg total) by mouth 2 (two) times daily with a meal. Please make overdue appt with Dr. Radford Pax. 1st attempt 60 tablet 0  . ezetimibe (ZETIA) 10 MG tablet Take 1 tablet (10 mg total) by mouth daily. Please make overdue appt with Dr. Radford Pax before anymore refills. 1st attempt 30 tablet 0  . furosemide (LASIX) 20 MG tablet Take 1 tablet (20 mg total) by mouth daily. 30 tablet 6  . ipratropium-albuterol (DUONEB) 0.5-2.5 (3)  MG/3ML SOLN Take 3 mLs by nebulization 3 (three) times daily. DX: J44.9 360 mL 5  . levothyroxine (SYNTHROID, LEVOTHROID) 50 MCG tablet Take 50 mcg by mouth daily before breakfast.    . Macitentan (OPSUMIT) 10 MG TABS Take 1 tablet (10 mg total) by mouth daily. 30 tablet 11  . metoprolol tartrate (LOPRESSOR) 25 MG tablet Take 25 mg by mouth as needed. If having to take often for breakthrough, may take 12.5 twice a day.  Please contact office if change is made    . Multiple Vitamin (MULTIVITAMIN) capsule Take 1 capsule by mouth daily.    . mupirocin ointment (BACTROBAN) 2 % Place 1 application into the nose every other day.    . pantoprazole (PROTONIX) 40 MG tablet Take 1 tablet (40 mg total) by mouth daily. 90 tablet 2  . potassium chloride SA (K-DUR,KLOR-CON) 20 MEQ tablet Take 0.5 tablets (10 mEq total) by mouth at bedtime.    . traMADol (ULTRAM) 50 MG tablet Take 50 mg by mouth every 8 (eight) hours as needed for moderate pain.    Marland Kitchen UNABLE TO FIND Med Name: CPAP  [facility]    . XARELTO 15 MG TABS tablet TAKE 1 TABLET BY MOUTH EVERY DAY WITH SUPPER 30 tablet 11  . Selexipag (UPTRAVI) 200 MCG TABS 1435mg in the AM and 16042m in the PM     No current facility-administered medications for this encounter.    BP (!) 108/56   Pulse (!) 57   Wt 191 lb 8 oz (86.9 kg)   SpO2 90% Comment: on 5L of O2  BMI 33.92 kg/m   General: NAD Neck: No JVD, no thyromegaly or thyroid nodule.  Lungs: Clear to auscultation bilaterally with normal respiratory effort. CV: Nondisplaced PMI.  Heart regular S1/S2, no S3/S4, no murmur.  No peripheral edema.  No carotid bruit.  Normal pedal pulses.  Abdomen: Soft, nontender, no hepatosplenomegaly, no distention.  Skin: Intact without lesions or rashes.  Neurologic: Alert and oriented x 3.  Psych: Normal affect. Extremities: No clubbing or cyanosis.  HEENT: Normal.   Assessment/Plan: 1. Chronic diastolic CHF: Echo 4/66 with EF 65-70%, normal RV size and systolic  function.  Given worsening symptoms and the finding of low cardiac output on RHC, I had expected her RV to look hypokinetic. However, on 2/19 echo, the RV looked fairly normal. She is not volume overloaded on exam.  - Continue lasix 20 mg daily and potassium 10 meq daily.  - BMET today.  2. Apical hypertrophic cardiomyopathy: Cardiac MRI in 7/16 showed mid-apical LV hypertrophy with vigorous contraction. Now off metoprolol with slow HR (uses metoprolol prn tachycardia - atrial fibrillation).  ECG is consistent with apical HCM.   3. Atrial fibrillation:  Remains in NSR on Multaq.  Per Dr Rayann Heman, would not re-do her ablation. Breakthrough atrial fibrillation in 7/19, DCCV back to NSR in ER. NSR today.   - Continue Xarelto.  No bleeding.   - Continue dronedarone.  4. Pulmonary hypertension: RHC in 3/18 showed moderate pulmonary arterial hypertension with elevated PVR (7.3 WU by Fick, 9.6 WU by thermo). She has COPD but I do not think this plays a large role in the pulmonary hypertension =>  PFTs in 3/18 showed minimal obstruction and restriction, there was significantly decreased DLCO, this may be due to pulmonary vascular disease.  OSA may play a role but not a large one. V/Q scan did not suggest chronic PEs in 3/18.  RF borderline elevated (probably not significant), HIV negative, Anti-SCL70 negative, ANA negative, P-ANCA weakly positive.  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   I have been concerned that she could have pulmonary veno-occlusive disease or pulmonary capillary hemangiomatosis based on the response to [facility] treatment thus far.  Her RHC in 3/19 showed lower PA pressure and PVR with normal right and left heart filling pressures, but cardiac output was lower than prior.  Surprisingly, her RV looked normal on echo so I question how accurate the cardiac output measurement really was.  Bubble study negative on echo.  I did a high resolution CT to  look for evidence of ILD, but it seems to have been degraded by respiratory artifact.  No severe abnormality was detected on the study.  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 She was started on Cellcept and prednisone but stopped both due to intolerances.  - Also of note, she has a history of afib ablation so pulmonary vein stenosis certainly remains a concern => I think she should get a pulmonary vein protocol CTA, but would like to see her creatinine down some (up to 1.7 when last checked, repeat today).   - Continue Opsumit.     - She did not tolerate Adcirca.   - With no definite evidence for PVOD, we have continued to titrate up her selexipag, now on 1400 qam/1600 qpm.  - She has followup with Dr. Lake Bells, may be reasonable to try her on a lower dose of Cellcept.   Followup in 2 months.   [name], MD  10/25/2017

## 2017-10-26 ENCOUNTER — Other Ambulatory Visit (HOSPITAL_COMMUNITY): Payer: Self-pay | Admitting: Cardiology

## 2017-10-27 ENCOUNTER — Encounter (HOSPITAL_COMMUNITY): Payer: Self-pay

## 2017-10-28 ENCOUNTER — Ambulatory Visit (INDEPENDENT_AMBULATORY_CARE_PROVIDER_SITE_OTHER): Payer: Medicare Other | Admitting: Pulmonary Disease

## 2017-10-28 ENCOUNTER — Other Ambulatory Visit (INDEPENDENT_AMBULATORY_CARE_PROVIDER_SITE_OTHER): Payer: Medicare Other

## 2017-10-28 ENCOUNTER — Encounter: Payer: Self-pay | Admitting: Pulmonary Disease

## 2017-10-28 VITALS — BP 124/66 | HR 70 | Ht 63.0 in | Wt 189.0 lb

## 2017-10-28 DIAGNOSIS — Z5181 Encounter for therapeutic drug level monitoring: Secondary | ICD-10-CM

## 2017-10-28 DIAGNOSIS — I270 Primary pulmonary hypertension: Secondary | ICD-10-CM | POA: Diagnosis not present

## 2017-10-28 DIAGNOSIS — F0631 Mood disorder due to known physiological condition with depressive features: Secondary | ICD-10-CM | POA: Diagnosis not present

## 2017-10-28 DIAGNOSIS — I2510 Atherosclerotic heart disease of native coronary artery without angina pectoris: Secondary | ICD-10-CM

## 2017-10-28 LAB — COMPREHENSIVE METABOLIC PANEL
ALK PHOS: 87 U/L (ref 39–117)
ALT: 31 U/L (ref 0–35)
AST: 19 U/L (ref 0–37)
Albumin: 4.2 g/dL (ref 3.5–5.2)
BUN: 25 mg/dL — AB (ref 6–23)
CHLORIDE: 109 meq/L (ref 96–112)
CO2: 21 meq/L (ref 19–32)
Calcium: 8.9 mg/dL (ref 8.4–10.5)
Creatinine, Ser: 1.54 mg/dL — ABNORMAL HIGH (ref 0.40–1.20)
GFR: 42.53 mL/min — AB (ref >60.00)
GLUCOSE: 132 mg/dL — AB (ref 70–99)
POTASSIUM: 4.5 meq/L (ref 3.5–5.1)
SODIUM: 139 meq/L (ref 135–145)
TOTAL PROTEIN: 7.6 g/dL (ref 6.0–8.3)
Total Bilirubin: 0.5 mg/dL (ref 0.2–1.2)

## 2017-10-28 LAB — CBC WITH DIFFERENTIAL/PLATELET
BASOS PCT: 1 % (ref 0.0–3.0)
Basophils Absolute: 0.1 10*3/uL (ref 0.0–0.1)
EOS PCT: 0.2 % (ref 0.0–5.0)
Eosinophils Absolute: 0 10*3/uL (ref 0.0–0.7)
HCT: 40.3 % (ref 36.0–46.0)
Hemoglobin: 13 g/dL (ref 12.0–15.0)
Lymphocytes Relative: 9 % — ABNORMAL LOW (ref 12.0–46.0)
Lymphs Abs: 0.7 10*3/uL (ref 0.7–4.0)
MCHC: 32.3 g/dL (ref 30.0–36.0)
MCV: 96.5 fl (ref 78.0–100.0)
MONOS PCT: 5.2 % (ref 3.0–12.0)
Monocytes Absolute: 0.4 10*3/uL (ref 0.1–1.0)
NEUTROS ABS: 6.5 10*3/uL (ref 1.4–7.7)
Neutrophils Relative %: 84.6 % — ABNORMAL HIGH (ref 43.0–77.0)
Platelets: 435 10*3/uL — ABNORMAL HIGH (ref 150.0–400.0)
RBC: 4.17 Mil/uL (ref 3.87–5.11)
RDW: 16.7 % — AB (ref 11.5–15.5)
WBC: 7.7 10*3/uL (ref 4.0–10.5)

## 2017-10-28 MED ORDER — PREDNISONE 10 MG PO TABS: 20.0000 mg | ORAL_TABLET | Freq: Every day | ORAL | 2 refills | Status: DC

## 2017-10-28 NOTE — Progress Notes (Signed)
Synopsis: Patient of Dr. 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  She also has diastolic heart failure.   Noted to have abnormal lab work concerning for an autoimmune process so started on Cellcept in 08/2017, did not tolerate due to joint pain.   Subjective:   PATIENT ID: Amy [name] GENDER: female DOB: 07/09/1945, MRN: [id number]   HPI  Chief Complaint  Patient presents with  . Follow-up    dyspnea stable.  s/s unchanged.    Since the last visit she was hospitalized for atrial fibrillation.  She recently started taking CellCept again.  However, after the last visit she initially had use CellCept and had trouble with joint pain.  Past Medical History:  Diagnosis Date  . Apical variant hypertrophic cardiomyopathy ([place])    Diagnosed by echo and ECG 11/13  . Atypical atrial flutter ([place])   . CAD (coronary artery disease)    a. LHC in 10/2011 demonstrated non-obstructive disease and an 80% lesion in a small D1 treated medically.   . CHF (congestive heart failure) ([place])   . Chronic diastolic heart failure ([place])   . CKD (chronic kidney disease), stage III ([place])   . COPD (chronic obstructive pulmonary disease) ([place])   . Dizziness    had PT for being off balance - in approx. 2012  . GERD (gastroesophageal reflux disease)   . Hyperlipidemia   . Hypertension   . Hypothyroid   . Lumbar disc disease   . Macular degeneration   . OSA (obstructive sleep apnea)   . Persistent atrial fibrillation ([place])       . Pulmonary hypertension ([place])    a. [place] 05/2013 showed mild PAH with normal PCWP and RA pressure, could be related to OSA and low oxygen saturation.   . Refusal of blood transfusions as patient is Jehovah's Witness   . Sinus bradycardia      Review of Systems  Constitutional: Positive for malaise/fatigue. Negative for chills, fever and weight loss.  HENT: Negative for congestion, nosebleeds, sinus pain  and sore throat.   Eyes: Negative for photophobia, pain and discharge.  Respiratory: Positive for cough and shortness of breath. Negative for hemoptysis, sputum production and wheezing.   Cardiovascular: Positive for leg swelling. Negative for chest pain, palpitations and orthopnea.  Gastrointestinal: Negative for abdominal pain, constipation, diarrhea, nausea and vomiting.  Genitourinary: Negative for dysuria, frequency, hematuria and urgency.  Musculoskeletal: Negative for back pain, joint pain, myalgias and neck pain.  Skin: Negative for itching and rash.  Neurological: Negative for tingling, tremors, sensory change, speech change, focal weakness, seizures, weakness and headaches.  Psychiatric/Behavioral: Negative for memory loss, substance abuse and suicidal ideas. The patient is not nervous/anxious.       Objective:  Physical Exam   Vitals:   10/28/17 1452  BP: 124/66  Pulse: 70  SpO2: 91%  Weight: 189 lb (85.7 kg)  Height: _0  (1.6 m)   4L [place]  Gen: chronically ill appearing HENT: OP clear, TM's clear, neck supple PULM: Crackles bases B, normal percussion CV: RRR, no mgr, trace edema GI: BS+, soft, nontender Derm: no cyanosis or rash Psyche: normal mood and affect    CBC    Component Value Date/Time   WBC 6.8 10/06/2017 0925   RBC 3.81 (L) 10/06/2017 0925   HGB 11.4 (L) 10/06/2017 0925   HCT 36.8 10/06/2017 0925   PLT 257 10/06/2017 0925   MCV 96.6 10/06/2017 0925  MCH 29.9 10/06/2017 0925   MCHC 31.0 10/06/2017 0925   RDW 20.0 (H) 10/06/2017 0925   LYMPHSABS 1.5 09/17/2017 1102   MONOABS 1.3 (H) 09/17/2017 1102   EOSABS 0.2 09/17/2017 1102   BASOSABS 0.1 09/17/2017 1102     Chest imaging: - V/Q scan negative 3/22 June 2017 high-resolution CT scan of the chest images independently reviewed showing patchy groundglass throughout with air trapping but no clear evidence of underlying interstitial lung disease.  Some evidence of emphysema seen, motion  degraded images  PFT: March 2018 ratio 83%, FVC 1.88 L 89% predicted, significant change with bronchodilator in the small airways, total lung capacity 3.63 L 76% predicted, DLCO 4.75 mL 22% predicted   Labs: - HIV, SCL70 negative.  RF borderline elevated, doubt significant.  ANA negative.  - Lab work from rheumatology in 2019:  P-ANCA test was negative, myositis panel showed an elevated R00-52 lab and the CK was elevated at 1030, CCP elevated  Path:  Echo: Echo (3/18) with EF 65-70%, apical hypertrophic cardiomyopathy, severe LAE, PASP 86 mmHg.  Echo (3/19): EF 65-70%, apical hypertrophic cardiomyopathy, normal RV size and systolic function, severe LAE.  Echo 6/19: EF normal, BUBBLE STUDY NEGATIVE  Heart Catheterization: - RHC (3/18): mean RA 4, PA 69/23 mean 38, mean PCWP 8, CI 2.17, PVR 7.3 WU Fick, 9.6 WU thermo.  - RHC (3/19): mean RA 6, PA 49/16 mean 29, mean PCWP 10, CI 1.9/PVR 5.3 WU Fick, CI 1.4/PVR 7.3 Thermo.   6 Min walk: 4/18 6 minute walk: 109 m  10/18 6 minute walk: 229 m 2/19 6 minute walk: < 100 m        Assessment & Plan:   No diagnosis found.  Discussion: Blia struggled with tolerance to CellCept, I think because she took the medicines as directed on the bottle of pills but not necessarily by our written and verbal instructions to her in clinic.  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  If she does not improve after a period of time (3 to 6 months) of consistent use of an adequate dose of immunosuppressive and then she needs [facility].  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  acetaminophen (TYLENOL) 325 MG tablet, Take 2 tablets (650 mg total) by mouth every 4 (four) hours as needed for headache or mild pain., Disp: , Rfl:  .  atorvastatin (LIPITOR) 80 MG tablet, Take 1 tablet (80 mg total) by mouth daily with lunch., Disp: 90 tablet, Rfl: 3 .  Cholecalciferol (VITAMIN D) 2000 UNITS CAPS, Take 2,000  Units by mouth daily. , Disp: , Rfl:  .  colchicine 0.6 MG tablet, Take 0.6 mg by mouth daily. , Disp: , Rfl:  .  cyclobenzaprine (FLEXERIL) 5 MG tablet, Take 1 tablet (5 mg total) by mouth 3 (three) times daily as needed for muscle spasms., Disp: 10 tablet, Rfl: 0 .  dronedarone (MULTAQ) 400 MG tablet, Take 1 tablet (400 mg total) by mouth 2 (two) times daily with a meal. Please make overdue appt with Dr. Radford Pax. 1st attempt, Disp: 60 tablet, Rfl: 0 .  ezetimibe (ZETIA) 10 MG tablet, Take 1 tablet (10 mg total) by mouth daily. Please make overdue appt with Dr. Radford Pax before anymore refills. 2nd attempt, Disp: 15 tablet, Rfl: 0 .  furosemide (LASIX) 20 MG tablet, Take 1 tablet (20 mg total) by mouth daily., Disp: 30 tablet, Rfl: 6 .  ipratropium-albuterol (DUONEB) 0.5-2.5 (3) MG/3ML SOLN, Take 3 mLs by nebulization 3 (three) times daily. DX: J44.9, Disp: 360 mL, Rfl: 5 .  levothyroxine (SYNTHROID, LEVOTHROID) 50 MCG tablet, Take 50 mcg by mouth daily before breakfast., Disp: , Rfl:  .  Macitentan (OPSUMIT) 10 MG TABS, Take 1 tablet (10 mg  total) by mouth daily., Disp: 30 tablet, Rfl: 11 .  metoprolol tartrate (LOPRESSOR) 25 MG tablet, Take 25 mg by mouth as needed. If having to take often for breakthrough, may take 12.5 twice a day.  Please contact office if change is made, Disp: , Rfl:  .  Multiple Vitamin (MULTIVITAMIN) capsule, Take 1 capsule by mouth daily., Disp: , Rfl:  .  mupirocin ointment (BACTROBAN) 2 %, Place 1 application into the nose every other day., Disp: , Rfl:  .  pantoprazole (PROTONIX) 40 MG tablet, Take 1 tablet (40 mg total) by mouth daily., Disp: 90 tablet, Rfl: 2 .  potassium chloride SA (K-DUR,KLOR-CON) 20 MEQ tablet, Take 0.5 tablets (10 mEq total) by mouth at bedtime., Disp: , Rfl:  .  Selexipag (UPTRAVI) 200 MCG TABS, 1429mg in the AM and 16077m in the PM, Disp: , Rfl:  .  traMADol (ULTRAM) 50 MG tablet, Take 50 mg by mouth every 8 (eight) hours as needed for moderate pain., Disp: , Rfl:  .  UNABLE TO FIND, Med Name: CPAP  [facility], Disp: , Rfl:  .  XARELTO 15 MG TABS tablet, TAKE 1 TABLET BY MOUTH EVERY DAY WITH SUPPER, Disp: 30 tablet, Rfl: 11

## 2017-10-28 NOTE — Patient Instructions (Signed)
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

## 2017-10-29 ENCOUNTER — Encounter (HOSPITAL_COMMUNITY): Payer: Self-pay

## 2017-10-30 ENCOUNTER — Telehealth: Payer: Self-pay | Admitting: Pulmonary Disease

## 2017-10-30 NOTE — Telephone Encounter (Signed)
Notes recorded by Juanito Doom, MD on 10/28/2017 at 5:22 PM EDT A, Please let the patient know this was OK Thanks, B  Advised pt of results. Pt understood and nothing further is needed.

## 2017-11-02 ENCOUNTER — Other Ambulatory Visit (HOSPITAL_COMMUNITY): Payer: Self-pay | Admitting: Cardiology

## 2017-11-03 ENCOUNTER — Encounter (HOSPITAL_COMMUNITY): Payer: Self-pay

## 2017-11-04 DIAGNOSIS — F0631 Mood disorder due to known physiological condition with depressive features: Secondary | ICD-10-CM | POA: Diagnosis not present

## 2017-11-05 ENCOUNTER — Encounter (HOSPITAL_COMMUNITY): Payer: Self-pay

## 2017-11-06 ENCOUNTER — Other Ambulatory Visit (HOSPITAL_COMMUNITY): Payer: Self-pay | Admitting: Cardiology

## 2017-11-09 ENCOUNTER — Other Ambulatory Visit: Payer: Self-pay | Admitting: Pharmacist

## 2017-11-09 NOTE — Patient Outreach (Addendum)
[place] [facility]) Care Management  [place]   11/09/2017  Amy [name] Feb 15, 1946 [id number]  Subjective: Patient was called regarding medication management follow up.  HIPAA identifiers were obtained. Patient is a 72 year old female with multiple medical conditions including but not limited to: Cardiomyopathy, dyslipidemia, OSA on CPAP,  hypertension, pulmonary hypertension, diastolic heart failure and A Fib status post ablation.    Patient visited the ED 10/06/17 for atrial flutter with RVR and was cardioverted.  Patient reported that she felt much better today and did not have any complaints.  Objective:   Encounter Medications: (Patient's medications were reviewed via telephone) Outpatient Encounter Medications as of 11/09/2017  Medication Sig Note  . acetaminophen (TYLENOL) 325 MG tablet Take 2 tablets (650 mg total) by mouth every 4 (four) hours as needed for headache or mild pain.   Marland Kitchen atorvastatin (LIPITOR) 80 MG tablet Take 1 tablet (80 mg total) by mouth daily with lunch.   . Cholecalciferol (VITAMIN D) 2000 UNITS CAPS Take 2,000 Units by mouth daily.    Marland Kitchen ezetimibe (ZETIA) 10 MG tablet Take 1 tablet (10 mg total) by mouth daily. Please make overdue appt with Dr. Radford Pax before anymore refills. 2nd attempt   . furosemide (LASIX) 20 MG tablet Take 1 tablet (20 mg total) by mouth daily.   Marland Kitchen ipratropium-albuterol (DUONEB) 0.5-2.5 (3) MG/3ML SOLN Take 3 mLs by nebulization 3 (three) times daily. DX: J44.9   . levothyroxine (SYNTHROID, LEVOTHROID) 50 MCG tablet Take 25 mcg by mouth daily before breakfast.    . Macitentan (OPSUMIT) 10 MG TABS Take 1 tablet (10 mg total) by mouth daily.   . metoprolol tartrate (LOPRESSOR) 25 MG tablet Take 25 mg by mouth as needed. If having to take often for breakthrough, may take 12.5 twice a day.  Please contact office if change is made   . Multiple Vitamin (MULTIVITAMIN) capsule Take 1 capsule by mouth daily.   . mupirocin  ointment (BACTROBAN) 2 % Place 1 application into the nose every other day. 11/09/2017: PRN  . mycophenolate (CELLCEPT) 500 MG tablet Take 2 tablets in the morning and 1 tablet in the evening (Dose being titrated to 158m twice daily)   . pantoprazole (PROTONIX) 40 MG tablet Take 1 tablet (40 mg total) by mouth daily.   . potassium chloride SA (K-DUR,KLOR-CON) 20 MEQ tablet Take 0.5 tablets (10 mEq total) by mouth at bedtime.   . predniSONE (DELTASONE) 10 MG tablet Take 2 tablets (20 mg total) by mouth daily with breakfast.   . Selexipag (UPTRAVI) 200 MCG TABS Take 1,600 mcg by mouth 2 (two) times daily.    . traMADol (ULTRAM) 50 MG tablet Take 50 mg by mouth every 8 (eight) hours as needed for moderate pain. 11/09/2017: PRN  . UNABLE TO FIND Med Name: CPAP  [facility]   . XARELTO 15 MG TABS tablet TAKE 1 TABLET BY MOUTH EVERY DAY WITH SUPPER   . colchicine 0.6 MG tablet Take 0.6 mg by mouth daily.    . cyclobenzaprine (FLEXERIL) 5 MG tablet Take 1 tablet (5 mg total) by mouth 3 (three) times daily as needed for muscle spasms.   .Marland Kitchendronedarone (MULTAQ) 400 MG tablet Take 1 tablet (400 mg total) by mouth 2 (two) times daily with a meal. Please make appt with Dr. TRadford Paxbefore anymore refills. 2nd attempt    No facility-administered encounter medications on file as of 11/09/2017.     Functional Status: No flowsheet data found.  Fall/Depression Screening: Fall Risk  11/07/2016 10/17/2016 10/13/2016  Falls in the past year? 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   Plan: Close patient's case as she does not having any pressing pharmacy needs. Patient has my contact information and understands she can call me at any time in the future.  Elayne [name], PharmD, [place] Clinical Pharmacist [phone]

## 2017-11-10 ENCOUNTER — Encounter (HOSPITAL_COMMUNITY): Payer: Self-pay

## 2017-11-11 ENCOUNTER — Encounter: Payer: Self-pay | Admitting: Cardiology

## 2017-11-11 ENCOUNTER — Other Ambulatory Visit (INDEPENDENT_AMBULATORY_CARE_PROVIDER_SITE_OTHER): Payer: Medicare Other

## 2017-11-11 DIAGNOSIS — F0631 Mood disorder due to known physiological condition with depressive features: Secondary | ICD-10-CM | POA: Diagnosis not present

## 2017-11-11 DIAGNOSIS — Z5181 Encounter for therapeutic drug level monitoring: Secondary | ICD-10-CM

## 2017-11-11 LAB — COMPREHENSIVE METABOLIC PANEL
ALBUMIN: 4 g/dL (ref 3.5–5.2)
ALK PHOS: 71 U/L (ref 39–117)
ALT: 36 U/L — ABNORMAL HIGH (ref 0–35)
AST: 21 U/L (ref 0–37)
BUN: 30 mg/dL — ABNORMAL HIGH (ref 6–23)
CALCIUM: 9.2 mg/dL (ref 8.4–10.5)
CO2: 22 mEq/L (ref 19–32)
Chloride: 103 mEq/L (ref 96–112)
Creatinine, Ser: 1.43 mg/dL — ABNORMAL HIGH (ref 0.40–1.20)
GFR: 46.32 mL/min — AB (ref >60.00)
Glucose, Bld: 127 mg/dL — ABNORMAL HIGH (ref 70–99)
Potassium: 4.7 mEq/L (ref 3.5–5.1)
Sodium: 132 mEq/L — ABNORMAL LOW (ref 135–145)
TOTAL PROTEIN: 6.8 g/dL (ref 6.0–8.3)
Total Bilirubin: 0.7 mg/dL (ref 0.2–1.2)

## 2017-11-11 LAB — CBC WITH DIFFERENTIAL/PLATELET
BASOS ABS: 0 10*3/uL (ref 0.0–0.1)
Basophils Relative: 0.1 % (ref 0.0–3.0)
Eosinophils Absolute: 0 10*3/uL (ref 0.0–0.7)
Eosinophils Relative: 0.2 % (ref 0.0–5.0)
HCT: 41.5 % (ref 36.0–46.0)
HEMOGLOBIN: 13.4 g/dL (ref 12.0–15.0)
LYMPHS PCT: 4 % — AB (ref 12.0–46.0)
Lymphs Abs: 0.6 10*3/uL — ABNORMAL LOW (ref 0.7–4.0)
MCHC: 32.4 g/dL (ref 30.0–36.0)
MCV: 95.3 fl (ref 78.0–100.0)
Monocytes Absolute: 0.6 10*3/uL (ref 0.1–1.0)
Monocytes Relative: 3.7 % (ref 3.0–12.0)
NEUTROS PCT: 92 % — AB (ref 43.0–77.0)
Neutro Abs: 13.6 10*3/uL — ABNORMAL HIGH (ref 1.4–7.7)
Platelets: 202 10*3/uL (ref 150.0–400.0)
RBC: 4.35 Mil/uL (ref 3.87–5.11)
RDW: 15.9 % — ABNORMAL HIGH (ref 11.5–15.5)
WBC: 14.8 10*3/uL — AB (ref 4.0–10.5)

## 2017-11-12 ENCOUNTER — Encounter (HOSPITAL_COMMUNITY): Payer: Self-pay

## 2017-11-17 ENCOUNTER — Other Ambulatory Visit (HOSPITAL_COMMUNITY): Payer: Self-pay | Admitting: Cardiology

## 2017-11-17 ENCOUNTER — Other Ambulatory Visit: Payer: Self-pay

## 2017-11-17 ENCOUNTER — Encounter (HOSPITAL_COMMUNITY): Payer: Self-pay

## 2017-11-17 MED ORDER — EZETIMIBE 10 MG PO TABS: 10.0000 mg | ORAL_TABLET | Freq: Every day | ORAL | 0 refills | Status: DC

## 2017-11-19 ENCOUNTER — Encounter (HOSPITAL_COMMUNITY): Payer: Self-pay

## 2017-11-24 ENCOUNTER — Other Ambulatory Visit (INDEPENDENT_AMBULATORY_CARE_PROVIDER_SITE_OTHER): Payer: Medicare Other

## 2017-11-24 ENCOUNTER — Encounter (HOSPITAL_COMMUNITY): Payer: Self-pay

## 2017-11-24 DIAGNOSIS — Z5181 Encounter for therapeutic drug level monitoring: Secondary | ICD-10-CM | POA: Diagnosis not present

## 2017-11-24 LAB — CBC WITH DIFFERENTIAL/PLATELET
Basophils Absolute: 0 10*3/uL (ref 0.0–0.1)
Basophils Relative: 0.4 % (ref 0.0–3.0)
EOS PCT: 0.3 % (ref 0.0–5.0)
Eosinophils Absolute: 0 10*3/uL (ref 0.0–0.7)
HCT: 40.7 % (ref 36.0–46.0)
Hemoglobin: 13.1 g/dL (ref 12.0–15.0)
LYMPHS ABS: 0.6 10*3/uL — AB (ref 0.7–4.0)
Lymphocytes Relative: 6.2 % — ABNORMAL LOW (ref 12.0–46.0)
MCHC: 32.3 g/dL (ref 30.0–36.0)
MCV: 94.4 fl (ref 78.0–100.0)
MONOS PCT: 7.3 % (ref 3.0–12.0)
Monocytes Absolute: 0.7 10*3/uL (ref 0.1–1.0)
Neutro Abs: 8.8 10*3/uL — ABNORMAL HIGH (ref 1.4–7.7)
Platelets: 299 10*3/uL (ref 150.0–400.0)
RBC: 4.31 Mil/uL (ref 3.87–5.11)
RDW: 16 % — ABNORMAL HIGH (ref 11.5–15.5)
WBC: 10.2 10*3/uL (ref 4.0–10.5)

## 2017-11-24 LAB — COMPREHENSIVE METABOLIC PANEL
ALBUMIN: 3.7 g/dL (ref 3.5–5.2)
ALK PHOS: 58 U/L (ref 39–117)
ALT: 43 U/L — AB (ref 0–35)
AST: 17 U/L (ref 0–37)
BUN: 33 mg/dL — ABNORMAL HIGH (ref 6–23)
CALCIUM: 8.9 mg/dL (ref 8.4–10.5)
CO2: 25 mEq/L (ref 19–32)
Chloride: 105 mEq/L (ref 96–112)
Creatinine, Ser: 1.44 mg/dL — ABNORMAL HIGH (ref 0.40–1.20)
GFR: 45.94 mL/min — AB (ref >60.00)
Glucose, Bld: 82 mg/dL (ref 70–99)
POTASSIUM: 4.3 meq/L (ref 3.5–5.1)
Sodium: 136 mEq/L (ref 135–145)
TOTAL PROTEIN: 6.4 g/dL (ref 6.0–8.3)
Total Bilirubin: 0.4 mg/dL (ref 0.2–1.2)

## 2017-11-26 ENCOUNTER — Encounter (HOSPITAL_COMMUNITY): Payer: Self-pay

## 2017-11-26 DIAGNOSIS — F0631 Mood disorder due to known physiological condition with depressive features: Secondary | ICD-10-CM | POA: Diagnosis not present

## 2017-11-29 ENCOUNTER — Inpatient Hospital Stay (HOSPITAL_COMMUNITY)
Admission: EM | Admit: 2017-11-29 | Discharge: 2017-12-08 | DRG: 378 | Disposition: A | Discharge status: Home / Self Care | Payer: Medicare Other | Attending: Internal Medicine | Admitting: Internal Medicine

## 2017-11-29 ENCOUNTER — Encounter (HOSPITAL_COMMUNITY): Payer: Self-pay

## 2017-11-29 ENCOUNTER — Other Ambulatory Visit: Payer: Self-pay

## 2017-11-29 ENCOUNTER — Emergency Department (HOSPITAL_COMMUNITY): Payer: Medicare Other

## 2017-11-29 DIAGNOSIS — Z888 Allergy status to other drugs, medicaments and biological substances status: Secondary | ICD-10-CM

## 2017-11-29 DIAGNOSIS — E039 Hypothyroidism, unspecified: Secondary | ICD-10-CM | POA: Diagnosis present

## 2017-11-29 DIAGNOSIS — K625 Hemorrhage of anus and rectum: Secondary | ICD-10-CM | POA: Diagnosis not present

## 2017-11-29 DIAGNOSIS — K648 Other hemorrhoids: Secondary | ICD-10-CM | POA: Diagnosis present

## 2017-11-29 DIAGNOSIS — R002 Palpitations: Secondary | ICD-10-CM | POA: Diagnosis not present

## 2017-11-29 DIAGNOSIS — Z7901 Long term (current) use of anticoagulants: Secondary | ICD-10-CM

## 2017-11-29 DIAGNOSIS — J4489 Other specified chronic obstructive pulmonary disease: Secondary | ICD-10-CM | POA: Diagnosis present

## 2017-11-29 DIAGNOSIS — J9611 Chronic respiratory failure with hypoxia: Secondary | ICD-10-CM | POA: Diagnosis not present

## 2017-11-29 DIAGNOSIS — R0902 Hypoxemia: Secondary | ICD-10-CM | POA: Diagnosis not present

## 2017-11-29 DIAGNOSIS — K922 Gastrointestinal hemorrhage, unspecified: Secondary | ICD-10-CM

## 2017-11-29 DIAGNOSIS — R001 Bradycardia, unspecified: Secondary | ICD-10-CM | POA: Diagnosis not present

## 2017-11-29 DIAGNOSIS — R0789 Other chest pain: Secondary | ICD-10-CM | POA: Diagnosis not present

## 2017-11-29 DIAGNOSIS — Z6833 Body mass index (BMI) 33.0-33.9, adult: Secondary | ICD-10-CM

## 2017-11-29 DIAGNOSIS — I5032 Chronic diastolic (congestive) heart failure: Secondary | ICD-10-CM | POA: Diagnosis present

## 2017-11-29 DIAGNOSIS — I481 Persistent atrial fibrillation: Secondary | ICD-10-CM | POA: Diagnosis present

## 2017-11-29 DIAGNOSIS — I480 Paroxysmal atrial fibrillation: Secondary | ICD-10-CM | POA: Diagnosis present

## 2017-11-29 DIAGNOSIS — K921 Melena: Secondary | ICD-10-CM | POA: Diagnosis present

## 2017-11-29 DIAGNOSIS — R29700 NIHSS score 0: Secondary | ICD-10-CM | POA: Diagnosis not present

## 2017-11-29 DIAGNOSIS — I959 Hypotension, unspecified: Secondary | ICD-10-CM | POA: Diagnosis present

## 2017-11-29 DIAGNOSIS — I422 Other hypertrophic cardiomyopathy: Secondary | ICD-10-CM | POA: Diagnosis not present

## 2017-11-29 DIAGNOSIS — I130 Hypertensive heart and chronic kidney disease with heart failure and stage 1 through stage 4 chronic kidney disease, or unspecified chronic kidney disease: Secondary | ICD-10-CM | POA: Diagnosis not present

## 2017-11-29 DIAGNOSIS — K5791 Diverticulosis of intestine, part unspecified, without perforation or abscess with bleeding: Principal | ICD-10-CM | POA: Diagnosis present

## 2017-11-29 DIAGNOSIS — G4733 Obstructive sleep apnea (adult) (pediatric): Secondary | ICD-10-CM | POA: Diagnosis present

## 2017-11-29 DIAGNOSIS — Z8249 Family history of ischemic heart disease and other diseases of the circulatory system: Secondary | ICD-10-CM

## 2017-11-29 DIAGNOSIS — E669 Obesity, unspecified: Secondary | ICD-10-CM | POA: Diagnosis present

## 2017-11-29 DIAGNOSIS — Z87891 Personal history of nicotine dependence: Secondary | ICD-10-CM

## 2017-11-29 DIAGNOSIS — R000 Tachycardia, unspecified: Secondary | ICD-10-CM | POA: Diagnosis not present

## 2017-11-29 DIAGNOSIS — N183 Chronic kidney disease, stage 3 (moderate): Secondary | ICD-10-CM | POA: Diagnosis present

## 2017-11-29 DIAGNOSIS — I2510 Atherosclerotic heart disease of native coronary artery without angina pectoris: Secondary | ICD-10-CM | POA: Diagnosis present

## 2017-11-29 DIAGNOSIS — K219 Gastro-esophageal reflux disease without esophagitis: Secondary | ICD-10-CM | POA: Diagnosis present

## 2017-11-29 DIAGNOSIS — I10 Essential (primary) hypertension: Secondary | ICD-10-CM | POA: Diagnosis present

## 2017-11-29 DIAGNOSIS — G459 Transient cerebral ischemic attack, unspecified: Secondary | ICD-10-CM | POA: Diagnosis not present

## 2017-11-29 DIAGNOSIS — Z79890 Hormone replacement therapy (postmenopausal): Secondary | ICD-10-CM

## 2017-11-29 DIAGNOSIS — H3530 Unspecified macular degeneration: Secondary | ICD-10-CM | POA: Diagnosis present

## 2017-11-29 DIAGNOSIS — J449 Chronic obstructive pulmonary disease, unspecified: Secondary | ICD-10-CM | POA: Diagnosis present

## 2017-11-29 DIAGNOSIS — R079 Chest pain, unspecified: Secondary | ICD-10-CM

## 2017-11-29 DIAGNOSIS — D62 Acute posthemorrhagic anemia: Secondary | ICD-10-CM | POA: Diagnosis not present

## 2017-11-29 DIAGNOSIS — R0602 Shortness of breath: Secondary | ICD-10-CM | POA: Diagnosis not present

## 2017-11-29 DIAGNOSIS — Z79899 Other long term (current) drug therapy: Secondary | ICD-10-CM

## 2017-11-29 DIAGNOSIS — Z531 Procedure and treatment not carried out because of patient's decision for reasons of belief and group pressure: Secondary | ICD-10-CM | POA: Diagnosis present

## 2017-11-29 DIAGNOSIS — Z86010 Personal history of colonic polyps: Secondary | ICD-10-CM

## 2017-11-29 DIAGNOSIS — I2721 Secondary pulmonary arterial hypertension: Secondary | ICD-10-CM | POA: Diagnosis present

## 2017-11-29 DIAGNOSIS — E785 Hyperlipidemia, unspecified: Secondary | ICD-10-CM | POA: Diagnosis present

## 2017-11-29 DIAGNOSIS — R4701 Aphasia: Secondary | ICD-10-CM | POA: Diagnosis not present

## 2017-11-29 DIAGNOSIS — Z9981 Dependence on supplemental oxygen: Secondary | ICD-10-CM

## 2017-11-29 LAB — CBC
HEMATOCRIT: 35.1 % — AB (ref 36.0–46.0)
HEMATOCRIT: 35.6 % — AB (ref 36.0–46.0)
HEMOGLOBIN: 11.5 g/dL — AB (ref 12.0–15.0)
Hemoglobin: 11.6 g/dL — ABNORMAL LOW (ref 12.0–15.0)
MCH: 30.2 pg (ref 26.0–34.0)
MCH: 30.2 pg (ref 26.0–34.0)
MCHC: 32.8 g/dL (ref 30.0–36.0)
MCHC: 32.6 g/dL (ref 30.0–36.0)
MCV: 92.1 fL (ref 78.0–100.0)
MCV: 92.7 fL (ref 78.0–100.0)
Platelets: 276 10*3/uL (ref 150–400)
Platelets: 270 10*3/uL (ref 150–400)
RBC: 3.81 MIL/uL — AB (ref 3.87–5.11)
RBC: 3.84 MIL/uL — ABNORMAL LOW (ref 3.87–5.11)
RDW: 16.3 % — ABNORMAL HIGH (ref 11.5–15.5)
RDW: 16.2 % — AB (ref 11.5–15.5)
WBC: 11.1 10*3/uL — ABNORMAL HIGH (ref 4.0–10.5)
WBC: 11.6 10*3/uL — AB (ref 4.0–10.5)

## 2017-11-29 LAB — C DIFFICILE QUICK SCREEN W PCR REFLEX
C Diff antigen: NEGATIVE
C Diff interpretation: NOT DETECTED
C Diff toxin: NEGATIVE

## 2017-11-29 LAB — ABO/RH: ABO/RH(D): O POS

## 2017-11-29 LAB — GASTROINTESTINAL PANEL BY PCR, STOOL (REPLACES STOOL CULTURE)
ADENOVIRUS F40/41: NOT DETECTED
Astrovirus: NOT DETECTED
CRYPTOSPORIDIUM: NOT DETECTED
CYCLOSPORA CAYETANENSIS: NOT DETECTED
Campylobacter species: NOT DETECTED
ENTEROAGGREGATIVE E COLI (EAEC): NOT DETECTED
ENTEROPATHOGENIC E COLI (EPEC): NOT DETECTED
Entamoeba histolytica: NOT DETECTED
Enterotoxigenic E coli (ETEC): NOT DETECTED
GIARDIA LAMBLIA: NOT DETECTED
Norovirus GI/GII: NOT DETECTED
Plesimonas shigelloides: NOT DETECTED
ROTAVIRUS A: NOT DETECTED
SALMONELLA SPECIES: NOT DETECTED
SHIGELLA/ENTEROINVASIVE E COLI (EIEC): NOT DETECTED
Sapovirus (I, II, IV, and V): NOT DETECTED
Shiga like toxin producing E coli (STEC): NOT DETECTED
VIBRIO SPECIES: NOT DETECTED
Vibrio cholerae: NOT DETECTED
YERSINIA ENTEROCOLITICA: NOT DETECTED

## 2017-11-29 LAB — COMPREHENSIVE METABOLIC PANEL
ALBUMIN: 3.1 g/dL — AB (ref 3.5–5.0)
ALT: 42 U/L (ref 0–44)
AST: 26 U/L (ref 15–41)
Alkaline Phosphatase: 48 U/L (ref 38–126)
Anion gap: 11 (ref 5–15)
BUN: 32 mg/dL — AB (ref 8–23)
CHLORIDE: 112 mmol/L — AB (ref 98–111)
CO2: 16 mmol/L — AB (ref 22–32)
CREATININE: 1.3 mg/dL — AB (ref 0.44–1.00)
Calcium: 8.1 mg/dL — ABNORMAL LOW (ref 8.9–10.3)
GFR calc Af Amer: 46 mL/min — ABNORMAL LOW (ref >60)
GFR, EST NON AFRICAN AMERICAN: 40 mL/min — AB (ref >60)
GLUCOSE: 69 mg/dL — AB (ref 70–99)
Potassium: 3.9 mmol/L (ref 3.5–5.1)
SODIUM: 139 mmol/L (ref 135–145)
Total Bilirubin: 0.7 mg/dL (ref 0.3–1.2)
Total Protein: 5.6 g/dL — ABNORMAL LOW (ref 6.5–8.1)

## 2017-11-29 LAB — PROTIME-INR
INR: 2.09
Prothrombin Time: 23.3 seconds — ABNORMAL HIGH (ref 11.4–15.2)

## 2017-11-29 LAB — CBC WITH DIFFERENTIAL/PLATELET
ABS IMMATURE GRANULOCYTES: 0.1 10*3/uL (ref 0.0–0.1)
Basophils Absolute: 0 10*3/uL (ref 0.0–0.1)
Basophils Relative: 0 %
Eosinophils Absolute: 0.1 10*3/uL (ref 0.0–0.7)
Eosinophils Relative: 1 %
HCT: 41 % (ref 36.0–46.0)
Hemoglobin: 12.7 g/dL (ref 12.0–15.0)
IMMATURE GRANULOCYTES: 1 %
LYMPHS ABS: 1.5 10*3/uL (ref 0.7–4.0)
Lymphocytes Relative: 15 %
MCH: 29.9 pg (ref 26.0–34.0)
MCHC: 31 g/dL (ref 30.0–36.0)
MCV: 96.5 fL (ref 78.0–100.0)
MONOS PCT: 9 %
Monocytes Absolute: 0.9 10*3/uL (ref 0.1–1.0)
NEUTROS ABS: 7.6 10*3/uL (ref 1.7–7.7)
NEUTROS PCT: 74 %
PLATELETS: 273 10*3/uL (ref 150–400)
RBC: 4.25 MIL/uL (ref 3.87–5.11)
RDW: 16.8 % — ABNORMAL HIGH (ref 11.5–15.5)
WBC: 10.3 10*3/uL (ref 4.0–10.5)

## 2017-11-29 LAB — TROPONIN I
Troponin I: 0.11 ng/mL (ref <0.03)
Troponin I: 0.15 ng/mL (ref <0.03)
Troponin I: 0.16 ng/mL (ref <0.03)

## 2017-11-29 LAB — LIPASE, BLOOD: LIPASE: 46 U/L (ref 11–51)

## 2017-11-29 LAB — TYPE AND SCREEN
ABO/RH(D): O POS
ANTIBODY SCREEN: NEGATIVE

## 2017-11-29 LAB — POC OCCULT BLOOD, ED: FECAL OCCULT BLD: POSITIVE — AB

## 2017-11-29 MED ORDER — ACETAMINOPHEN 650 MG RE SUPP: 650.0000 mg | Freq: Four times a day (QID) | RECTAL | Status: DC | PRN

## 2017-11-29 MED ORDER — IPRATROPIUM-ALBUTEROL 0.5-2.5 (3) MG/3ML IN SOLN
3.0000 mL | Freq: Three times a day (TID) | RESPIRATORY_TRACT | Status: DC
  Administered 2017-11-29 – 2017-12-08 (×22): 3 mL via RESPIRATORY_TRACT
  Filled 2017-11-29 (×26): qty 3

## 2017-11-29 MED ORDER — DRONEDARONE HCL 400 MG PO TABS
400.0000 mg | ORAL_TABLET | Freq: Two times a day (BID) | ORAL | Status: DC
  Administered 2017-11-29 – 2017-12-08 (×18): 400 mg via ORAL
  Filled 2017-11-29 (×22): qty 1

## 2017-11-29 MED ORDER — HYDROCODONE-ACETAMINOPHEN 5-325 MG PO TABS: 1.0000 | ORAL_TABLET | ORAL | Status: DC | PRN

## 2017-11-29 MED ORDER — METOPROLOL TARTRATE 25 MG PO TABS
12.5000 mg | ORAL_TABLET | Freq: Two times a day (BID) | ORAL | Status: DC
  Administered 2017-11-29: 12.5 mg via ORAL
  Filled 2017-11-29 (×2): qty 1

## 2017-11-29 MED ORDER — ACETAMINOPHEN 325 MG PO TABS: 650.0000 mg | ORAL_TABLET | Freq: Four times a day (QID) | ORAL | Status: DC | PRN

## 2017-11-29 MED ORDER — MYCOPHENOLATE MOFETIL 500 MG PO TABS
1000.0000 mg | ORAL_TABLET | Freq: Two times a day (BID) | ORAL | Status: DC
  Filled 2017-11-29 (×3): qty 2

## 2017-11-29 MED ORDER — SODIUM CHLORIDE 0.9 % IV SOLN: INTRAVENOUS | Status: DC

## 2017-11-29 MED ORDER — PREDNISONE 10 MG PO TABS
10.0000 mg | ORAL_TABLET | Freq: Two times a day (BID) | ORAL | Status: DC
  Administered 2017-11-29 – 2017-12-08 (×17): 10 mg via ORAL
  Filled 2017-11-29 (×17): qty 1

## 2017-11-29 MED ORDER — SELEXIPAG 1600 MCG PO TABS
1600.0000 ug | ORAL_TABLET | Freq: Two times a day (BID) | ORAL | Status: DC
  Administered 2017-11-29 – 2017-12-08 (×18): 1600 ug via ORAL
  Filled 2017-11-29 (×21): qty 1

## 2017-11-29 MED ORDER — MACITENTAN 10 MG PO TABS
10.0000 mg | ORAL_TABLET | Freq: Every day | ORAL | Status: DC
  Administered 2017-11-29 – 2017-12-07 (×8): 10 mg via ORAL
  Filled 2017-11-29 (×11): qty 1

## 2017-11-29 MED ORDER — COLCHICINE 0.6 MG PO TABS
0.6000 mg | ORAL_TABLET | Freq: Every day | ORAL | Status: DC
  Administered 2017-11-29 – 2017-12-08 (×9): 0.6 mg via ORAL
  Filled 2017-11-29 (×9): qty 1

## 2017-11-29 MED ORDER — SODIUM CHLORIDE 0.9 % IV SOLN
INTRAVENOUS | Status: DC
  Administered 2017-11-29: 12:00:00 via INTRAVENOUS

## 2017-11-29 MED ORDER — EZETIMIBE 10 MG PO TABS
10.0000 mg | ORAL_TABLET | Freq: Every day | ORAL | Status: DC
  Administered 2017-11-29 – 2017-12-08 (×9): 10 mg via ORAL
  Filled 2017-11-29 (×9): qty 1

## 2017-11-29 MED ORDER — FAMOTIDINE IN NACL 20-0.9 MG/50ML-% IV SOLN
20.0000 mg | Freq: Two times a day (BID) | INTRAVENOUS | Status: DC
  Administered 2017-11-29 – 2017-12-06 (×16): 20 mg via INTRAVENOUS
  Filled 2017-11-29 (×17): qty 50

## 2017-11-29 MED ORDER — SENNOSIDES-DOCUSATE SODIUM 8.6-50 MG PO TABS: 1.0000 | ORAL_TABLET | Freq: Every evening | ORAL | Status: DC | PRN

## 2017-11-29 MED ORDER — FUROSEMIDE 20 MG PO TABS
20.0000 mg | ORAL_TABLET | Freq: Every day | ORAL | Status: DC
  Administered 2017-11-29: 20 mg via ORAL
  Filled 2017-11-29: qty 1

## 2017-11-29 MED ORDER — MYCOPHENOLATE MOFETIL 250 MG PO CAPS
1000.0000 mg | ORAL_CAPSULE | Freq: Two times a day (BID) | ORAL | Status: DC
  Administered 2017-11-29: 1000 mg via ORAL
  Filled 2017-11-29: qty 4

## 2017-11-29 MED ORDER — LEVOTHYROXINE SODIUM 25 MCG PO TABS
25.0000 ug | ORAL_TABLET | Freq: Every day | ORAL | Status: DC
  Administered 2017-11-29 – 2017-12-08 (×9): 25 ug via ORAL
  Filled 2017-11-29 (×9): qty 1

## 2017-11-29 MED ORDER — ONDANSETRON HCL 4 MG/2ML IJ SOLN: 4.0000 mg | Freq: Four times a day (QID) | INTRAMUSCULAR | Status: DC | PRN

## 2017-11-29 MED ORDER — ONDANSETRON HCL 4 MG PO TABS: 4.0000 mg | ORAL_TABLET | Freq: Four times a day (QID) | ORAL | Status: DC | PRN

## 2017-11-29 MED ORDER — BISACODYL 10 MG RE SUPP: 10.0000 mg | Freq: Every day | RECTAL | Status: DC | PRN

## 2017-11-29 MED ORDER — ATORVASTATIN CALCIUM 80 MG PO TABS
80.0000 mg | ORAL_TABLET | Freq: Every day | ORAL | Status: DC
  Administered 2017-11-29 – 2017-12-07 (×8): 80 mg via ORAL
  Filled 2017-11-29 (×6): qty 1
  Filled 2017-11-29: qty 4
  Filled 2017-11-29 (×2): qty 1

## 2017-11-29 MED ORDER — SELEXIPAG 800 MCG PO TABS: 1600.0000 ug | ORAL_TABLET | Freq: Two times a day (BID) | ORAL | Status: DC

## 2017-11-29 MED ORDER — POTASSIUM CHLORIDE CRYS ER 10 MEQ PO TBCR
10.0000 meq | EXTENDED_RELEASE_TABLET | Freq: Every day | ORAL | Status: DC
  Administered 2017-11-29 – 2017-12-01 (×3): 10 meq via ORAL
  Filled 2017-11-29 (×3): qty 1

## 2017-11-29 NOTE — Consult Note (Addendum)
Referring Provider:  [facility] Primary Care Physician:  Aretta Nip, MD Primary Gastroenterologist:  Dr. Penelope Coop   Reason for Consultation:  GI bleed  HPI: Amy [name] is a 72 y.o. female with multiple medical comorbidities including paroxysmal atrial fibrillation currently on Xarelto, history of diastolic heart failure, history of pulmonary hypertension, history of  sleep apnea, history of coronary artery disease, history of atypical hypertrophic cardiomyopathy presented to the hospital with GI bleed and palpitation.normal hemoglobin at 12.7. Normal [employer].gl elevated INR at 2.09.normal lipase.occult blood positive.  Patient seen and examined at bedside. She started having painless rectal bleeding earlier this morning. Described as maroon color stool. Initially she had soft stool but now she started having bloody diarrhea. Denied any black tarry stool. Denied any associated abdominal pain. had one episode of nonbloody vomiting which is resolved now. She also had a palpitation.  Previous GI workup ------------------------- - Colonoscopy in 2012 showed small hyperplastic polyp in the descending colon and small tubular adenoma in the rectum. Patient was seen by Dr. Penelope Coop in April 2018 and because of multiple medical co morbidities virtual colonoscopy was ordered for surveillance. - Virtual colonoscopy negative in May 2018. Past Medical History:  Diagnosis Date  . Apical variant hypertrophic cardiomyopathy ([place])    Diagnosed by echo and ECG 11/13  . Atypical atrial flutter ([place])   . CAD (coronary artery disease)    a. LHC in 10/2011 demonstrated non-obstructive disease and an 80% lesion in a small D1 treated medically.   . CHF (congestive heart failure) ([place])   . Chronic diastolic heart failure ([place])   . CKD (chronic kidney disease), stage III ([place])   . COPD (chronic obstructive pulmonary disease) ([place])   . Dizziness    had PT for being off balance - in approx. 2012  . GERD  (gastroesophageal reflux disease)   . Hyperlipidemia   . Hypertension   . Hypothyroid   . Lumbar disc disease   . Macular degeneration   . OSA (obstructive sleep apnea)   . Persistent atrial fibrillation ([place])       . Pulmonary hypertension ([place])    a. [place] 05/2013 showed mild PAH with normal PCWP and RA pressure, could be related to OSA and low oxygen saturation.   . Refusal of blood transfusions as patient is Jehovah's Witness   . Sinus bradycardia     Past Surgical History:  Procedure Laterality Date  . CARDIAC CATHETERIZATION     normal coronary arteries  . CARDIAC CATHETERIZATION N/A 10/31/2014   Procedure: Left Heart Cath and Coronary Angiography;  Surgeon: Leonie Man, MD;  Location: [place] [facility];  Service: Cardiovascular;  Laterality: N/A;  . CATARACT EXTRACTION Bilateral   . CESAREAN SECTION  1983  . ELECTROPHYSIOLOGIC STUDY N/A 04/24/2015   Procedure: Atrial Fibrillation Ablation;  Surgeon: [name], MD;  Location: [place] [facility];  Service: Cardiovascular;  Laterality: N/A;  . RIGHT HEART CATH N/A 06/25/2016   Procedure: Right Heart Cath;  Surgeon: Sherren Mocha, MD;  Location: [place] [facility];  Service: Cardiovascular;  Laterality: N/A;  . RIGHT HEART CATH N/A 06/10/2017   Procedure: RIGHT HEART CATH;  Surgeon: Larey Dresser, MD;  Location: [place] [facility];  Service: Cardiovascular;  Laterality: N/A;  . RIGHT HEART CATHETERIZATION N/A 06/01/2013   Procedure: RIGHT HEART CATH;  Surgeon: Larey Dresser, MD;  Location: [facility] CATH LAB;  Service: Cardiovascular;  Laterality: N/A;  . TEE WITHOUT CARDIOVERSION N/A 04/23/2015  Procedure: TRANSESOPHAGEAL ECHOCARDIOGRAM (TEE);  Surgeon: Josue Hector, MD;  Location: [facility] ENDOSCOPY;  Service: Cardiovascular;  Laterality: N/A;    Prior to Admission medications   Medication Sig Start Date End Date Taking? Authorizing Provider  acetaminophen (TYLENOL) 325 MG tablet Take 2 tablets (650 mg total) by mouth every 4  (four) hours as needed for headache or mild pain. 10/02/16  Yes Kilroy, Luke K, PA-C  atorvastatin (LIPITOR) 80 MG tablet Take 1 tablet (80 mg total) by mouth daily with lunch. 02/08/15  Yes Sherran Needs, NP  Cholecalciferol (VITAMIN D) 2000 UNITS CAPS Take 2,000 Units by mouth daily.    Yes [provider]  colchicine 0.6 MG tablet Take 0.6 mg by mouth daily.    Yes [provider]  dronedarone (MULTAQ) 400 MG tablet Take 1 tablet (400 mg total) by mouth 2 (two) times daily with a meal. Please make appt with Dr. Radford Pax before anymore refills. 2nd attempt 11/02/17  Yes Turner, Eber Hong, MD  ezetimibe (ZETIA) 10 MG tablet Take 1 tablet (10 mg total) by mouth daily. Please make overdue appt with Dr. Radford Pax before anymore refills. 2nd attempt 11/17/17  Yes Turner, Eber Hong, MD  furosemide (LASIX) 20 MG tablet Take 1 tablet (20 mg total) by mouth daily. 08/13/16  Yes Larey Dresser, MD  ipratropium-albuterol (DUONEB) 0.5-2.5 (3) MG/3ML SOLN Take 3 mLs by nebulization 3 (three) times daily. DX: J44.9 07/03/17  Yes Juanito Doom, MD  levothyroxine (SYNTHROID, LEVOTHROID) 50 MCG tablet Take 25 mcg by mouth daily before breakfast.    Yes [provider]  Macitentan (OPSUMIT) 10 MG TABS Take 1 tablet (10 mg total) by mouth daily. 06/29/17  Yes Larey Dresser, MD  metoprolol tartrate (LOPRESSOR) 25 MG tablet Take 25 mg by mouth as needed. If having to take often for breakthrough, may take 12.5 twice a day.  Please contact office if change is made   Yes [provider]  Multiple Vitamin (MULTIVITAMIN) capsule Take 1 capsule by mouth daily.   Yes [provider]  mycophenolate (CELLCEPT) 500 MG tablet Take 1,000 mg by mouth 2 (two) times daily.    Yes Juanito Doom, MD  pantoprazole (PROTONIX) 40 MG tablet Take 1 tablet (40 mg total) by mouth daily. 08/05/17  Yes Larey Dresser, MD  potassium chloride SA (K-DUR,KLOR-CON) 20 MEQ tablet Take 0.5 tablets (10 mEq  total) by mouth at bedtime. 10/02/16  Yes Kilroy, Luke K, PA-C  predniSONE (DELTASONE) 10 MG tablet Take 2 tablets (20 mg total) by mouth daily with breakfast. Patient taking differently: Take 10 mg by mouth 2 (two) times daily with a meal.  10/28/17  Yes McQuaid, Ronie Spies, MD  Selexipag (UPTRAVI) 800 MCG TABS Take 1,600 mcg by mouth 2 (two) times daily.    Yes [provider]  traMADol (ULTRAM) 50 MG tablet Take 50 mg by mouth every 8 (eight) hours as needed for moderate pain.   Yes [provider]  XARELTO 15 MG TABS tablet TAKE 1 TABLET BY MOUTH EVERY DAY WITH SUPPER Patient taking differently: Take 15 mg by mouth daily with supper.  09/02/17  Yes Larey Dresser, MD  cyclobenzaprine (FLEXERIL) 5 MG tablet Take 1 tablet (5 mg total) by mouth 3 (three) times daily as needed for muscle spasms. Patient not taking: Reported on 11/29/2017 06/26/16   Geradine Girt, DO  UNABLE TO FIND Med Name: CPAP  [facility]    [provider]  Scheduled Meds: . atorvastatin  80 mg Oral Q lunch  . colchicine  0.6 mg Oral Daily  . dronedarone  400 mg Oral BID WC  . ezetimibe  10 mg Oral Daily  . furosemide  20 mg Oral Daily  . ipratropium-albuterol  3 mL Nebulization TID  . levothyroxine  25 mcg Oral QAC breakfast  . macitentan  10 mg Oral Daily  . metoprolol tartrate  12.5 mg Oral BID  . mycophenolate  1,000 mg Oral BID  . potassium chloride SA  10 mEq Oral QHS  . predniSONE  10 mg Oral BID WC  . Selexipag  1,600 mcg Oral BID   Continuous Infusions: . sodium chloride    . famotidine (PEPCID) IV     PRN Meds:.acetaminophen **OR** acetaminophen, bisacodyl, HYDROcodone-acetaminophen, ondansetron **OR** ondansetron (ZOFRAN) IV, senna-docusate  Allergies as of 11/29/2017 - Review Complete 11/29/2017  Allergen Reaction Noted  . Amiodarone  10/31/2014  . Zyban [bupropion] Itching 05/17/2013    Family History  Problem Relation Age of Onset  . Heart disease Sister        Good  Pastures Syndrome  . Gout Father   . Lung cancer Father        smoked  . Breast cancer Paternal Aunt     Social History   Socioeconomic History  . Marital status: Widowed    Spouse name: divorced.  . Number of children: 1  . Years of education: Not on file  . Highest education level: Not on file  Occupational History  . Occupation: retired. still works for [employer].   Social Needs  . Financial resource strain: Not on file  . Food insecurity:    Worry: Not on file    Inability: Not on file  . Transportation needs:    Medical: Not on file    Non-medical: Not on file  Tobacco Use  . Smoking status: Former Smoker    Packs/day: 0.50    Years: 44.00    Pack years: 22.00    Types: Cigarettes    Last attempt to quit: 04/07/2009    Years since quitting: 8.6  . Smokeless tobacco: Never Used  Substance and Sexual Activity  . Alcohol use: No  . Drug use: No  . Sexual activity: Never  Lifestyle  . Physical activity:    Days per week: Not on file    Minutes per session: Not on file  . Stress: Not on file  Relationships  . Social connections:    Talks on phone: Not on file    Gets together: Not on file    Attends religious service: Not on file    Active member of club or organization: Not on file    Attends meetings of clubs or organizations: Not on file    Relationship status: Not on file  . Intimate partner violence:    Fear of current or ex partner: Not on file    Emotionally abused: Not on file    Physically abused: Not on file    Forced sexual activity: Not on file  Other Topics Concern  . Not on file  Social History Narrative   Pt lives alone with 2 dogs.     Review of Systems: Review of Systems  Constitutional: Negative for chills and fever.  HENT: Negative for hearing loss and tinnitus.   Eyes: Negative for blurred vision and double vision.  Respiratory: Positive for shortness of breath. Negative for cough and hemoptysis.   Cardiovascular: Positive for  palpitations. Negative for chest pain.  Gastrointestinal: Positive for blood in stool, diarrhea, heartburn, nausea and vomiting.  Genitourinary: Negative for dysuria and urgency.  Musculoskeletal: Positive for back pain and myalgias.  Skin: Negative for rash.  Neurological: Negative for seizures and loss of consciousness.  Endo/Heme/Allergies: Does not bruise/bleed easily.  Psychiatric/Behavioral: Negative for hallucinations and suicidal ideas.    Physical Exam: Vital signs: Vitals:   11/29/17 0842 11/29/17 0844  BP: (!) 137/126 103/77  Pulse: (!) 109 (!) 55  Resp: 20 20  Temp: (!) 97.5 F (36.4 C)   SpO2: 100%      Physical Exam  Constitutional: She is oriented to person, place, and time. She appears well-developed and well-nourished.  4 L oxygen by nasal cannula  HENT:  Head: Normocephalic and atraumatic.  Eyes: EOM are normal. No scleral icterus.  Neck: Neck supple. No thyromegaly present.  Cardiovascular: Normal rate and regular rhythm.  Pulmonary/Chest: Effort normal. No respiratory distress.  Abdominal: Soft. Bowel sounds are normal. She exhibits no distension. There is no tenderness. There is no rebound and no guarding.  Musculoskeletal: Normal range of motion. She exhibits edema.  Neurological: She is alert and oriented to person, place, and time.  Skin: Skin is warm. No erythema.  Psychiatric: She has a normal mood and affect. Judgment normal.  rectal exam deferred.  GI:  Lab Results: Recent Labs    11/29/17 0550  WBC 10.3  HGB 12.7  HCT 41.0  PLT 273   BMET Recent Labs    11/29/17 0550  NA 139  K 3.9  CL 112*  CO2 16*  GLUCOSE 69*  BUN 32*  CREATININE 1.30*  CALCIUM 8.1*   LFT Recent Labs    11/29/17 0550  PROT 5.6*  ALBUMIN 3.1*  AST 26  ALT 42  ALKPHOS 48  BILITOT 0.7   PT/INR Recent Labs    11/29/17 0550  LABPROT 23.3*  INR 2.09     Studies/Results: Dg Chest 2 View  Result Date: 11/29/2017 CLINICAL DATA:  Shortness of  breath.  Chest discomfort. EXAM: CHEST - 2 VIEW COMPARISON:  Radiograph 10/06/2017.  CT 06/08/2017 FINDINGS: Cardiomegaly is unchanged from prior exam. Unchanged mediastinal contours with aortic atherosclerosis. No pulmonary edema. Scarring in the left mid lung. No focal airspace disease or pleural effusion. No pneumothorax. Unchanged osseous structures. IMPRESSION: Chronic cardiomegaly and Aortic Atherosclerosis (ICD10-I70.0). No CHF or acute pulmonary process. Electronically Signed   By: Jeb Levering M.D.   On: 11/29/2017 06:22    Impression/Plan: - painless rectal bleeding while on Xarelto. 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 - Bloody diarrhea. -  atrial fibrillation. Last dose of Xarelto  yesterday night - History of pulmonary hypertension. - History of atypical cardiomyopathy. 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 Negative virtual colonoscopy last year.  Recommendations ------------------------- - Plan for unsedated flexible sigmoidoscopy tomorrow. She had negative virtual colonoscopy last year.not an ideal candidate for sedation and  Colonoscopy. - continue to hold anticoagulation. - Monitor H&H. - GI will follow  Risks (bleeding, infection, bowel perforation that could require surgery, sedation-related changes in cardiopulmonary systems), benefits (identification and possible treatment of source of symptoms, exclusion of certain causes of symptoms), and alternatives (watchful waiting, radiographic imaging studies, empiric medical treatment)  were explained to patient/family in detail and patient wishes to proceed.   LOS: 0 days   Otis Brace  MD, FACP 11/29/2017, 9:24 AM  Contact #  [phone]

## 2017-11-29 NOTE — Progress Notes (Signed)
Patient arrived from the ED. She is alert and oriented. Patient brought her Opsumit, Malvin Johns, and Mycophenolate from home. She is aware that medication will need to be taken to the [facility]. Patient denies any pain at the present. Patient. CCMD has been notified with second verification.

## 2017-11-29 NOTE — ED Provider Notes (Signed)
[facility] EMERGENCY DEPARTMENT Provider Note   CSN: [id number] Arrival date & time: 11/29/17  7793     History   Chief Complaint Chief Complaint  Patient presents with  . Tachycardia    HPI Amy [name] is a 72 y.o. female.   history of pulmonary hypertension (etiology/workup still in progress), COPD, OSA, apical hypertrophic cardiomyopathy, chronic diastolic heart failure, and paroxysmal atrial fibrillation who presented central chest pressure onset about 8:30 PM last night and was constant until about 10 PM.  This resolved after one episode of nausea and nonbloody vomiting.  Patient states shortly after that she felt palpitations and her fitness tracker app showed her heart rate was in the 130s.  She took 1 of her PRN metoprolol's and went to sleep.  She woke up around 3:30 AM to have a bowel movement and her heart rate was again in the 130s.  At that time she noted that she was having a bloody stool with blood mixed in with brown stool which prompted her to call EMS.  She is no longer having palpitations or chest pressure.  No shortness of breath.  No fever, chills.  She does take Xarelto and has never had blood in stool before.  Denies any dizziness or lightheadedness.  She feels back to baseline now other than being concerned about the blood in her stool.  She arrives in sinus rhythm at the rate of 61.  The history is provided by the patient and the EMS personnel.    Past Medical History:  Diagnosis Date  . Apical variant hypertrophic cardiomyopathy ([place])    Diagnosed by echo and ECG 11/13  . Atypical atrial flutter ([place])   . CAD (coronary artery disease)    a. LHC in 10/2011 demonstrated non-obstructive disease and an 80% lesion in a small D1 treated medically.   . CHF (congestive heart failure) ([place])   . Chronic diastolic heart failure ([place])   . CKD (chronic kidney disease), stage III ([place])   . COPD (chronic obstructive pulmonary disease) ([place])     . Dizziness    had PT for being off balance - in approx. 2012  . GERD (gastroesophageal reflux disease)   . Hyperlipidemia   . Hypertension   . Hypothyroid   . Lumbar disc disease   . Macular degeneration   . OSA (obstructive sleep apnea)   . Persistent atrial fibrillation ([place])       . Pulmonary hypertension ([place])    a. [place] 05/2013 showed mild PAH with normal PCWP and RA pressure, could be related to OSA and low oxygen saturation.   . Refusal of blood transfusions as patient is Jehovah's Witness   . Sinus bradycardia     Patient Active Problem List   Diagnosis Date Noted  . Congestive heart failure ([place])   . Chronic respiratory failure with hypoxia ([place]) 05/29/2017  . Paroxysmal atrial fibrillation with rapid ventricular response ([place]) 10/01/2016  . PAF (paroxysmal atrial fibrillation) ([place]) 10/01/2016  . Dyspnea   . Pulmonary hypertension, primary ([place])   . Atrial flutter with rapid ventricular response ([place])   . Elevated troponin   . NSTEMI (non-ST elevated myocardial infarction) ([place]) 10/29/2014  . Chest pain 10/28/2014  . Atrial flutter with RVR 10/28/2014  . Morbid obesity due to excess calories ([place]) 07/19/2014  . Chronic anticoagulation 07/19/2014  . CAD (coronary artery disease) 07/19/2014  . Chronic renal insufficiency, stage III (moderate) ([place]) 07/19/2014  . Dyslipidemia 04/12/2014  . Hypoxia  04/25/2013  . OSA (obstructive sleep apnea) 04/13/2013  . Apical variant hypertrophic cardiomyopathy ([place]) 03/05/2012  . Chronic diastolic heart failure ([place])   . Persistent atrial fibrillation ([place])   . Sinus bradycardia   . Asthmatic bronchitis , chronic ([place]) 03/04/2012  . Hypothyroidism 03/04/2012  . Essential hypertension 03/04/2012  . DOE (dyspnea on exertion) 12/24/2010    Past Surgical History:  Procedure Laterality Date  . CARDIAC CATHETERIZATION     normal coronary arteries  . CARDIAC CATHETERIZATION N/A 10/31/2014   Procedure: Left Heart Cath and  Coronary Angiography;  Surgeon: Leonie Man, MD;  Location: [facility];  Service: Cardiovascular;  Laterality: N/A;  . CATARACT EXTRACTION Bilateral   . CESAREAN SECTION  1983  . ELECTROPHYSIOLOGIC STUDY N/A 04/24/2015   Procedure: Atrial Fibrillation Ablation;  Surgeon: [name], MD;  Location: [facility];  Service: Cardiovascular;  Laterality: N/A;  . RIGHT HEART CATH N/A 06/25/2016   Procedure: Right Heart Cath;  Surgeon: Sherren Mocha, MD;  Location: [facility];  Service: Cardiovascular;  Laterality: N/A;  . RIGHT HEART CATH N/A 06/10/2017   Procedure: RIGHT HEART CATH;  Surgeon: Larey Dresser, MD;  Location: [facility];  Service: Cardiovascular;  Laterality: N/A;  . RIGHT HEART CATHETERIZATION N/A 06/01/2013   Procedure: RIGHT HEART CATH;  Surgeon: Larey Dresser, MD;  Location[facility] CATH LAB;  Service: Cardiovascular;  Laterality: N/A;  . TEE WITHOUT CARDIOVERSION N/A 04/23/2015   Procedure: TRANSESOPHAGEAL ECHOCARDIOGRAM (TEE);  Surgeon: Josue Hector, MD;  Location: [facility] ENDOSCOPY;  Service: Cardiovascular;  Laterality: N/A;     OB History   None      Home Medications    Prior to Admission medications   Medication Sig Start Date End Date Taking? Authorizing Provider  acetaminophen (TYLENOL) 325 MG tablet Take 2 tablets (650 mg total) by mouth every 4 (four) hours as needed for headache or mild pain. 10/02/16  Yes Kilroy, Luke K, PA-C  atorvastatin (LIPITOR) 80 MG tablet Take 1 tablet (80 mg total) by mouth daily with lunch. 02/08/15  Yes Sherran Needs, NP  Cholecalciferol (VITAMIN D) 2000 UNITS CAPS Take 2,000 Units by mouth daily.    Yes [provider]  colchicine 0.6 MG tablet Take 0.6 mg by mouth daily.    Yes [provider]  dronedarone (MULTAQ) 400 MG tablet Take 1 tablet (400 mg total) by mouth 2 (two) times daily with a meal. Please make appt with Dr. Radford Pax before anymore refills. 2nd attempt 11/02/17  Yes Turner,  Eber Hong, MD  ezetimibe (ZETIA) 10 MG tablet Take 1 tablet (10 mg total) by mouth daily. Please make overdue appt with Dr. Radford Pax before anymore refills. 2nd attempt 11/17/17  Yes Turner, Eber Hong, MD  furosemide (LASIX) 20 MG tablet Take 1 tablet (20 mg total) by mouth daily. 08/13/16  Yes Larey Dresser, MD  ipratropium-albuterol (DUONEB) 0.5-2.5 (3) MG/3ML SOLN Take 3 mLs by nebulization 3 (three) times daily. DX: J44.9 07/03/17  Yes Juanito Doom, MD  levothyroxine (SYNTHROID, LEVOTHROID) 50 MCG tablet Take 25 mcg by mouth daily before breakfast.    Yes [provider]  Macitentan (OPSUMIT) 10 MG TABS Take 1 tablet (10 mg total) by mouth daily. 06/29/17  Yes Larey Dresser, MD  metoprolol tartrate (LOPRESSOR) 25 MG tablet Take 25 mg by mouth as needed. If having to take often for breakthrough, may take 12.5 twice a day.  Please contact office  if change is made   Yes [provider]  Multiple Vitamin (MULTIVITAMIN) capsule Take 1 capsule by mouth daily.   Yes [provider]  mycophenolate (CELLCEPT) 500 MG tablet Take 1,000 mg by mouth 2 (two) times daily.    Yes Juanito Doom, MD  pantoprazole (PROTONIX) 40 MG tablet Take 1 tablet (40 mg total) by mouth daily. 08/05/17  Yes Larey Dresser, MD  potassium chloride SA (K-DUR,KLOR-CON) 20 MEQ tablet Take 0.5 tablets (10 mEq total) by mouth at bedtime. 10/02/16  Yes Kilroy, Luke K, PA-C  predniSONE (DELTASONE) 10 MG tablet Take 2 tablets (20 mg total) by mouth daily with breakfast. Patient taking differently: Take 10 mg by mouth 2 (two) times daily with a meal.  10/28/17  Yes McQuaid, Ronie Spies, MD  Selexipag (UPTRAVI) 800 MCG TABS Take 1,600 mcg by mouth 2 (two) times daily.    Yes [provider]  traMADol (ULTRAM) 50 MG tablet Take 50 mg by mouth every 8 (eight) hours as needed for moderate pain.   Yes [provider]  XARELTO 15 MG TABS tablet TAKE 1 TABLET BY MOUTH EVERY DAY WITH SUPPER Patient  taking differently: Take 15 mg by mouth daily with supper.  09/02/17  Yes Larey Dresser, MD  cyclobenzaprine (FLEXERIL) 5 MG tablet Take 1 tablet (5 mg total) by mouth 3 (three) times daily as needed for muscle spasms. Patient not taking: Reported on 11/29/2017 06/26/16   Geradine Girt, DO  UNABLE TO FIND Med Name: CPAP  [facility]    [provider]    Family History Family History  Problem Relation Age of Onset  . Heart disease Sister        Good Pastures Syndrome  . Gout Father   . Lung cancer Father        smoked  . Breast cancer Paternal Aunt     Social History Social History   Tobacco Use  . Smoking status: Former Smoker    Packs/day: 0.50    Years: 44.00    Pack years: 22.00    Types: Cigarettes    Last attempt to quit: 04/07/2009    Years since quitting: 8.6  . Smokeless tobacco: Never Used  Substance Use Topics  . Alcohol use: No  . Drug use: No     Allergies   Amiodarone and Zyban [bupropion]   Review of Systems Review of Systems  Constitutional: Negative for activity change, appetite change, fatigue and fever.  HENT: Negative for congestion and rhinorrhea.   Respiratory: Positive for chest tightness. Negative for shortness of breath.   Cardiovascular: Negative for chest pain.  Gastrointestinal: Positive for abdominal pain, anal bleeding, blood in stool, nausea and vomiting.  Endocrine: Negative for polyuria.  Genitourinary: Negative for decreased urine volume, dysuria, hematuria and vaginal bleeding.  Musculoskeletal: Negative for arthralgias and myalgias.  Skin: Negative for rash.  Neurological: Negative for dizziness, weakness, light-headedness and headaches.   all other systems are negative except as noted in the HPI and PMH.     Physical Exam Updated Vital Signs BP 104/62   Pulse 66   Resp (!) 26   Ht _0  (1.6 m)   Wt 86.2 kg   SpO2 95%   BMI 33.66 kg/m   Physical Exam  Constitutional: She is oriented to person, place, and  time. She appears well-developed and well-nourished. No distress.  HENT:  Head: Normocephalic and atraumatic.  Mouth/Throat: Oropharynx is clear and moist. No oropharyngeal exudate.  Eyes: Pupils are equal, round, and reactive to light. Conjunctivae and EOM are normal.  Neck: Normal range of motion. Neck supple.  No meningismus.  Cardiovascular: Normal rate, regular rhythm, normal heart sounds and intact distal pulses.  No murmur heard. Pulmonary/Chest: Effort normal and breath sounds normal. No respiratory distress.  Abdominal: Soft. There is no tenderness. There is no rebound and no guarding.  Genitourinary:  Genitourinary Comments: Pink stool on rectal exam chaperone present  Musculoskeletal: Normal range of motion. She exhibits no edema or tenderness.  Neurological: She is alert and oriented to person, place, and time. No cranial nerve deficit. She exhibits normal muscle tone. Coordination normal.  No ataxia on finger to nose bilaterally. No pronator drift. 5/5 strength throughout. CN 2-12 intact.Equal grip strength. Sensation intact.   Skin: Skin is warm.  Psychiatric: She has a normal mood and affect. Her behavior is normal.  Nursing note and vitals reviewed.    ED Treatments / Results  Labs (all labs ordered are listed, but only abnormal results are displayed) Labs Reviewed  CBC WITH DIFFERENTIAL/PLATELET - Abnormal; Notable for the following components:      Result Value   RDW 16.8 (*)    All other components within normal limits  COMPREHENSIVE METABOLIC PANEL - Abnormal; Notable for the following components:   Chloride 112 (*)    CO2 16 (*)    Glucose, Bld 69 (*)    BUN 32 (*)    Creatinine, Ser 1.30 (*)    Calcium 8.1 (*)    Total Protein 5.6 (*)    Albumin 3.1 (*)    GFR calc non Af Amer 40 (*)    GFR calc Af Amer 46 (*)    All other components within normal limits  PROTIME-INR - Abnormal; Notable for the following components:   Prothrombin Time 23.3 (*)    All  other components within normal limits  TROPONIN I - Abnormal; Notable for the following components:   Troponin I 0.11 (*)    All other components within normal limits  POC OCCULT BLOOD, ED - Abnormal; Notable for the following components:   Fecal Occult Bld POSITIVE (*)    All other components within normal limits  C DIFFICILE QUICK SCREEN W PCR REFLEX  GASTROINTESTINAL PANEL BY PCR, STOOL (REPLACES STOOL CULTURE)  LIPASE, BLOOD  CBC  CBC  URINALYSIS, ROUTINE W REFLEX MICROSCOPIC  TROPONIN I  TROPONIN I  TYPE AND SCREEN  ABO/RH    EKG EKG Interpretation  Date/Time:  Sunday November 29 2017 05:28:46 EDT Ventricular Rate:  61 PR Interval:    QRS Duration: 83 QT Interval:  447 QTC Calculation: 451 R Axis:   65 Text Interpretation:  Sinus rhythm LVH with secondary repolarization abnormality no Afib  No significant change was found Confirmed by Ezequiel Essex [phone]) on 11/29/2017 5:32:13 AM   Radiology Dg Chest 2 View  Result Date: 11/29/2017 CLINICAL DATA:  Shortness of breath.  Chest discomfort. EXAM: CHEST - 2 VIEW COMPARISON:  Radiograph 10/06/2017.  CT 06/08/2017 FINDINGS: Cardiomegaly is unchanged from prior exam. Unchanged mediastinal contours with aortic atherosclerosis. No pulmonary edema. Scarring in the left mid lung. No focal airspace disease or pleural effusion. No pneumothorax. Unchanged osseous structures. IMPRESSION: Chronic cardiomegaly and Aortic Atherosclerosis (ICD10-I70.0). No CHF or acute pulmonary process. Electronically Signed   By: Jeb Levering M.D.   On: 11/29/2017 06:22    Procedures Procedures (including critical care time)  Medications Ordered in ED Medications - No data to  display   Initial Impression / Assessment and Plan / ED Course  I have reviewed the triage vital signs and the nursing notes.  Pertinent labs & imaging results that were available during my care of the patient were reviewed by me and considered in my medical decision  making (see chart for details).    Episode of chest burning followed by nausea and vomiting.  Periods of palpitations and racing heart which is since resolved.  Now with rectal bleeding on Xarelto.  Sinus rhythm on arrival with stable blood pressure.  FOBT positive with gross blood on exam.  Hemoglobin stable. Type and screen sent.   She is on xarelto as well as multiple autoimmune drugs which may contribute to GI bleeding.  Troponin elevated, apparently chronic. No further CP or SOB.  With rectal bleeding on xarelto, plan admission for observation and chest pain rule out. D/w PAC [place].  Final Clinical Impressions(s) / ED Diagnoses   Final diagnoses:  Rectal bleeding  Palpitations  Chest pain, unspecified type    ED Discharge Orders    None       Carliyah Cotterman, Annie Main, MD 11/29/17 1032

## 2017-11-29 NOTE — H&P (Addendum)
History and Physical    Amy [name] ZOX:[id number] DOB: 11/15/1945 DOA: 11/29/2017   PCP: Aretta Nip, MD   Patient coming from:  Home    Chief Complaint: GI bleed, palpitations  HPI: Amy [name] is a 72 y.o. female with medical history significant for paroxysmal atrial fibrillation on Xarelto, hypertension, hyperlipidemia, hypothyroidism diastolic heart failure, CAD, with catheterization in October 07, 2017, nonobstructive CAD medical management, OSA on CPAP, as well as apical H COM by cardiac MRI, with mild to moderate pulmonary hypertension, on CellCept, OPSUMIT Uptravi, followed by cardiology and pulmonology, presenting to the emergency department after an episode of central chest pressure around 8:30 PM last night, which lasted for about 2 hours, but resolved after one episode of nausea and nonbloody vomiting.  Patient states that she felt palpitations from her fitness tracker application, showing a heart rate in the 130s.  Patient to 1 of her PRN metoprolol, and went to sleep, waking up around 3:30 AM, with a bowel movement in, which she noted was bloody with mixed in brown stools, "mahogany", at which time she called EMS.  At this point, she is no complaint of palpitations or chest pressure.  Appears that the metoprolol has helped heart rate.  She denies any shortness of breath.  She denies any fever or chills.  She denies any food poisoning.  She denies any GI infections she denies taking any NSAIDs, she has not take aspirin..  She states that prior to these bloody event, she had small episodes of brown stools.  At the ER, she had one large bloody bowel movement.  She states that the stools are brighter than before.  She denies any dizziness or lightheadedness, vertigo, syncope or presyncope.  She does complain of abdominal "bloating", but no frank pain.  No history of of GI cancer.  She states that she had a colonoscopy in September 2012, with several hyperplastic polyps,  but no cancer was identified. She also had a neg virtual colonoscopy in 2018, by Dr. Penelope Coop is her GI physician.   ED Course:  BP 103/61   Pulse (!) 52   Resp 14   Ht _0  (1.6 m)   Wt 86.2 kg   SpO2 99%   BMI 33.66 kg/m    Sinus rhythm. LVH with secondary repolarization abnormality. no Afib. No significant change was found Troponin 0.11 Hemoccult is positive Last 2D echo March 2019 shows EF 65 to 81%, grade 2 diastolic, normal motion at wall White count 10.3, hemoglobin 12.7, at baseline, with MCV 96.5, platelets 273. PT 23.3, INR 2.09. Glucose was 69. GI panel is pending. The patient, as mentioned above, had one bloody stool at the ER. Currently, she is more stable, and chest pain-free.  Review of Systems:  As per HPI otherwise all other systems reviewed and are negative  Past Medical History:  Diagnosis Date  . Apical variant hypertrophic cardiomyopathy ([place])    Diagnosed by echo and ECG 11/13  . Atypical atrial flutter ([place])   . CAD (coronary artery disease)    a. LHC in 10/2011 demonstrated non-obstructive disease and an 80% lesion in a small D1 treated medically.   . CHF (congestive heart failure) ([place])   . Chronic diastolic heart failure ([place])   . CKD (chronic kidney disease), stage III ([place])   . COPD (chronic obstructive pulmonary disease) ([place])   . Dizziness    had PT for being off balance - in approx. 2012  . GERD (gastroesophageal reflux  disease)   . Hyperlipidemia   . Hypertension   . Hypothyroid   . Lumbar disc disease   . Macular degeneration   . OSA (obstructive sleep apnea)   . Persistent atrial fibrillation ([place])       . Pulmonary hypertension ([place])    a. [place] 05/2013 showed mild PAH with normal PCWP and RA pressure, could be related to OSA and low oxygen saturation.   . Refusal of blood transfusions as patient is Jehovah's Witness   . Sinus bradycardia     Past Surgical History:  Procedure Laterality Date  . CARDIAC CATHETERIZATION     normal  coronary arteries  . CARDIAC CATHETERIZATION N/A 10/31/2014   Procedure: Left Heart Cath and Coronary Angiography;  Surgeon: Leonie Man, MD;  Location: [facility];  Service: Cardiovascular;  Laterality: N/A;  . CATARACT EXTRACTION Bilateral   . CESAREAN SECTION  1983  . ELECTROPHYSIOLOGIC STUDY N/A 04/24/2015   Procedure: Atrial Fibrillation Ablation;  Surgeon: [name], MD;  Location: [facility];  Service: Cardiovascular;  Laterality: N/A;  . RIGHT HEART CATH N/A 06/25/2016   Procedure: Right Heart Cath;  Surgeon: Sherren Mocha, MD;  Location: [facility];  Service: Cardiovascular;  Laterality: N/A;  . RIGHT HEART CATH N/A 06/10/2017   Procedure: RIGHT HEART CATH;  Surgeon: Larey Dresser, MD;  Location: [facility];  Service: Cardiovascular;  Laterality: N/A;  . RIGHT HEART CATHETERIZATION N/A 06/01/2013   Procedure: RIGHT HEART CATH;  Surgeon: Larey Dresser, MD;  Location: [facility] CATH LAB;  Service: Cardiovascular;  Laterality: N/A;  . TEE WITHOUT CARDIOVERSION N/A 04/23/2015   Procedure: TRANSESOPHAGEAL ECHOCARDIOGRAM (TEE);  Surgeon: Josue Hector, MD;  Location: [facility] ENDOSCOPY;  Service: Cardiovascular;  Laterality: N/A;    Social History Social History   Socioeconomic History  . Marital status: Widowed    Spouse name: divorced.  . Number of children: 1  . Years of education: Not on file  . Highest education level: Not on file  Occupational History  . Occupation: retired. still works for [employer].   Social Needs  . Financial resource strain: Not on file  . Food insecurity:    Worry: Not on file    Inability: Not on file  . Transportation needs:    Medical: Not on file    Non-medical: Not on file  Tobacco Use  . Smoking status: Former Smoker    Packs/day: 0.50    Years: 44.00    Pack years: 22.00    Types: Cigarettes    Last attempt to quit: 04/07/2009    Years since quitting: 8.6  . Smokeless tobacco: Never Used  Substance and Sexual  Activity  . Alcohol use: No  . Drug use: No  . Sexual activity: Never  Lifestyle  . Physical activity:    Days per week: Not on file    Minutes per session: Not on file  . Stress: Not on file  Relationships  . Social connections:    Talks on phone: Not on file    Gets together: Not on file    Attends religious service: Not on file    Active member of club or organization: Not on file    Attends meetings of clubs or organizations: Not on file    Relationship status: Not on file  . Intimate partner violence:    Fear of current or ex partner: Not on file    Emotionally abused: Not on file  Physically abused: Not on file    Forced sexual activity: Not on file  Other Topics Concern  . Not on file  Social History Narrative   Pt lives alone with 2 dogs.      Allergies  Allergen Reactions  . Amiodarone     Dyspnea - felt to be 2/2 amio lung toxicity Has tolerated Omnipaque   . Zyban [Bupropion] Itching    Family History  Problem Relation Age of Onset  . Heart disease Sister        Good Pastures Syndrome  . Gout Father   . Lung cancer Father        smoked  . Breast cancer Paternal Aunt        Prior to Admission medications   Medication Sig Start Date End Date Taking? Authorizing Provider  acetaminophen (TYLENOL) 325 MG tablet Take 2 tablets (650 mg total) by mouth every 4 (four) hours as needed for headache or mild pain. 10/02/16  Yes Kilroy, Luke K, PA-C  atorvastatin (LIPITOR) 80 MG tablet Take 1 tablet (80 mg total) by mouth daily with lunch. 02/08/15  Yes Sherran Needs, NP  Cholecalciferol (VITAMIN D) 2000 UNITS CAPS Take 2,000 Units by mouth daily.    Yes [provider]  colchicine 0.6 MG tablet Take 0.6 mg by mouth daily.    Yes [provider]  dronedarone (MULTAQ) 400 MG tablet Take 1 tablet (400 mg total) by mouth 2 (two) times daily with a meal. Please make appt with Dr. Radford Pax before anymore refills. 2nd attempt 11/02/17  Yes Turner, Eber Hong, MD  ezetimibe (ZETIA) 10 MG tablet Take 1 tablet (10 mg total) by mouth daily. Please make overdue appt with Dr. Radford Pax before anymore refills. 2nd attempt 11/17/17  Yes Turner, Eber Hong, MD  furosemide (LASIX) 20 MG tablet Take 1 tablet (20 mg total) by mouth daily. 08/13/16  Yes Larey Dresser, MD  ipratropium-albuterol (DUONEB) 0.5-2.5 (3) MG/3ML SOLN Take 3 mLs by nebulization 3 (three) times daily. DX: J44.9 07/03/17  Yes Juanito Doom, MD  levothyroxine (SYNTHROID, LEVOTHROID) 50 MCG tablet Take 25 mcg by mouth daily before breakfast.    Yes [provider]  Macitentan (OPSUMIT) 10 MG TABS Take 1 tablet (10 mg total) by mouth daily. 06/29/17  Yes Larey Dresser, MD  metoprolol tartrate (LOPRESSOR) 25 MG tablet Take 25 mg by mouth as needed. If having to take often for breakthrough, may take 12.5 twice a day.  Please contact office if change is made   Yes [provider]  Multiple Vitamin (MULTIVITAMIN) capsule Take 1 capsule by mouth daily.   Yes [provider]  mycophenolate (CELLCEPT) 500 MG tablet Take 1,000 mg by mouth 2 (two) times daily.    Yes Juanito Doom, MD  pantoprazole (PROTONIX) 40 MG tablet Take 1 tablet (40 mg total) by mouth daily. 08/05/17  Yes Larey Dresser, MD  potassium chloride SA (K-DUR,KLOR-CON) 20 MEQ tablet Take 0.5 tablets (10 mEq total) by mouth at bedtime. 10/02/16  Yes Kilroy, Luke K, PA-C  predniSONE (DELTASONE) 10 MG tablet Take 2 tablets (20 mg total) by mouth daily with breakfast. Patient taking differently: Take 10 mg by mouth 2 (two) times daily with a meal.  10/28/17  Yes McQuaid, Ronie Spies, MD  Selexipag (UPTRAVI) 800 MCG TABS Take 1,600 mcg by mouth 2 (two) times daily.    Yes [provider]  traMADol (ULTRAM) 50 MG tablet Take 50 mg by  mouth every 8 (eight) hours as needed for moderate pain.   Yes [provider]  XARELTO 15 MG TABS tablet TAKE 1 TABLET BY MOUTH EVERY DAY WITH SUPPER Patient taking  differently: Take 15 mg by mouth daily with supper.  09/02/17  Yes Larey Dresser, MD  cyclobenzaprine (FLEXERIL) 5 MG tablet Take 1 tablet (5 mg total) by mouth 3 (three) times daily as needed for muscle spasms. Patient not taking: Reported on 11/29/2017 06/26/16   Geradine Girt, DO  UNABLE TO FIND Med Name: CPAP  [facility]    [provider]     Physical Exam:  Vitals:   11/29/17 0737 11/29/17 0738 11/29/17 0739 11/29/17 0740  BP:      Pulse: (!) 53 (!) 54 (!) 54 (!) 52  Resp: _0 SpO2: 100% 98% 100% 99%  Weight:      Height:       Constitutional: NAD, calm, comfortable  Eyes: PERRL, lids and conjunctivae normal ENMT: Mucous membranes are moist, without exudate or lesions  Neck: normal, supple, no masses, no thyromegaly Respiratory: clear to auscultation bilaterally, no wheezing, no crackles. Normal respiratory effort  Cardiovascular: Regular rate and rhythm with occasional ectopic beat, 1 out of systolicmurmur, rubs or gallops. No extremity edema. 2+ pedal pulses. No carotid bruits.  Abdomen: Soft, non tender, No hepatosplenomegaly.  Active bowel sounds.  Per EDP, Hemoccult was positive. Musculoskeletal: no clubbing / cyanosis. Moves all extremities Skin: no jaundice, No lesions.  Neurologic: Sensation intact  Strength equal in all extremities Psychiatric:   Alert and oriented x 3. Normal mood.    CBC: Recent Labs  Lab 11/24/17 1351 11/29/17 0550  WBC 10.2 10.3  NEUTROABS 8.8* 7.6  HGB 13.1 12.7  HCT 40.7 41.0  MCV 94.4 96.5  PLT 299.0 332    Basic Metabolic Panel: Recent Labs  Lab 11/24/17 1351 11/29/17 0550  NA 136 139  K 4.3 3.9  CL 105 112*  CO2 25 16*  GLUCOSE 82 69*  BUN 33* 32*  CREATININE 1.44* 1.30*  CALCIUM 8.9 8.1*  Results for JONATHON, CASTELO (MRN [id number]) as of 11/29/2017 07:44  Ref. Range 11/29/2017 05:50  Alkaline Phosphatase Latest Ref Range: 38 - 126 U/L 48  Albumin Latest Ref Range: 3.5 - 5.0 g/dL 3.1 (L)  AST  Latest Ref Range: 15 - 41 U/L 26  ALT Latest Ref Range: 0 - 44 U/L 42  Total Protein Latest Ref Range: 6.5 - 8.1 g/dL 5.6 (L)  Total Bilirubin Latest Ref Range: 0.3 - 1.2 mg/dL 0.7    GFR: Estimated Creatinine Clearance: 40.7 mL/min (A) (by C-G formula based on SCr of 1.3 mg/dL (H)).  Liver Function Tests: Recent Labs  Lab 11/24/17 1351 11/29/17 0550  AST 17 26  ALT 43* 42  ALKPHOS 58 48  BILITOT 0.4 0.7  PROT 6.4 5.6*  ALBUMIN 3.7 3.1*   Recent Labs  Lab 11/29/17 0550  LIPASE 46   No results for input(s): AMMONIA in the last 168 hours.  Coagulation Profile: Recent Labs  Lab 11/29/17 0550  INR 2.09    Cardiac Enzymes: Recent Labs  Lab 11/29/17 0550  TROPONINI 0.11*    BNP (last 3 results) No results for input(s): PROBNP in the last 8760 hours.  HbA1C: No results for input(s): HGBA1C in the last 72 hours.  CBG: No results for input(s): GLUCAP in the last 168 hours.  Lipid Profile: No results for input(s): CHOL, HDL, LDLCALC, TRIG,  CHOLHDL, LDLDIRECT in the last 72 hours.  Thyroid Function Tests: No results for input(s): TSH, T4TOTAL, FREET4, T3FREE, THYROIDAB in the last 72 hours.  Anemia Panel: No results for input(s): VITAMINB12, FOLATE, FERRITIN, TIBC, IRON, RETICCTPCT in the last 72 hours.  Urine analysis:    Component Value Date/Time   COLORURINE STRAW (A) 10/01/2016 1537   APPEARANCEUR CLEAR 10/01/2016 1537   LABSPEC 1.005 10/01/2016 1537   PHURINE 5.0 10/01/2016 1537   GLUCOSEU NEGATIVE 10/01/2016 1537   HGBUR NEGATIVE 10/01/2016 1537   BILIRUBINUR NEGATIVE 10/01/2016 1537   KETONESUR NEGATIVE 10/01/2016 1537   PROTEINUR NEGATIVE 10/01/2016 1537   NITRITE NEGATIVE 10/01/2016 1537   LEUKOCYTESUR NEGATIVE 10/01/2016 1537    Sepsis Labs: _0 (procalcitonin:4,lacticidven:4) )No results found for this or any previous visit (from the past 240 hour(s)).   Radiological Exams on Admission: Dg Chest 2 View  Result Date:  11/29/2017 CLINICAL DATA:  Shortness of breath.  Chest discomfort. EXAM: CHEST - 2 VIEW COMPARISON:  Radiograph 10/06/2017.  CT 06/08/2017 FINDINGS: Cardiomegaly is unchanged from prior exam. Unchanged mediastinal contours with aortic atherosclerosis. No pulmonary edema. Scarring in the left mid lung. No focal airspace disease or pleural effusion. No pneumothorax. Unchanged osseous structures. IMPRESSION: Chronic cardiomegaly and Aortic Atherosclerosis (ICD10-I70.0). No CHF or acute pulmonary process. Electronically Signed   By: Jeb Levering M.D.   On: 11/29/2017 06:22    EKG: Independently reviewed.  Assessment/Plan Active Problems:   Asthmatic bronchitis , chronic (HCC)   Hypothyroidism   Essential hypertension   Apical variant hypertrophic cardiomyopathy (HCC)   Chronic diastolic heart failure (HCC)   OSA (obstructive sleep apnea)   CAD (coronary artery disease)   PAF (paroxysmal atrial fibrillation) (HCC)   Chronic respiratory failure with hypoxia (HCC)   GIB (gastrointestinal bleeding)   GIB with Hcult positive in the setting of anticoagulation with Xarelto versus other etiology such as of infectious source .  Patient is on multiple autoimmune drugs, including CellCept, Multaq, Opsumit, Optruvi as well as prednisone, unclear if there is a relation between bleeding issues and this meds use.  BUN 32 .Hb normal  Denies history of Cancer, last colonoscopy 2012 with some hyperplastic polyps seen at the time and virtual colonoscopy in 2018.  Admit to telemetry obs  Cycle CBC every 6 hours Type and screen complete  NPO, advance to Clear liquids only Normal saline IV fluid  Pepcid  20 mg IV BID pending GI eval Hold Xarelto  For now, will continue with her autoimmune meds for pulmonary hypertension. Monitor liver functions/ LFT  GI consult appreciated   Elevated Troponin The patient is asymptomatic. likely due to GIB . Patient currently CP free  Serial EKG and Tn , will monitor, if  continues to show changes will obtain Cardiology evaluation.    Paroxysmal Atrial Fibrillation  on anticoagulation with on Xarelto.  An episode of tachycardia, for which he took metoprolol, with significant response, currently she is in sinus rhythm, and her rate is in the 60s.  Continue meds including Multaq, Dronedarone  but hold Xarelto due to GI bleed.    Diastolic Heart Failure, EF 65-70, Gr 2   C AD, with catheterization in October 07, 2017, nonobstructive CAD medical management,   H COM by cardiac MRI, with mild to moderate pulmonary hypertension, P-ANCA+, CCP+, RO-52+, CellCept, OPSUMIT Uptravi, Multaq Continue Meds, including Opsumit, Uptravi, Cellcept, prednisone for now.  If bleeding issues continue, then will obtain hematology consultation.    Chronic kidney disease stage 3  Current Cr is 1.3  Lab Results  Component Value Date   CREATININE 1.30 (H) 11/29/2017   CREATININE 1.44 (H) 11/24/2017   CREATININE 1.43 (H) 11/11/2017  IVF Repeat BMET in am      OSA on CPAP  Continue CPAP   Hypertension BP  132/99   Pulse (!) 54    Continue home anti-hypertensive medications   Hyperlipidemia Continue home statins  Hypothyroidism: Continue home Synthroid Check TSH   GERD, no acute symptoms Continue PPI  COPD without exacerbation Osats normal  WBC normal  Continue nebs and O2 prn    DVT prophylaxis: SCDs Code Status:    Full code Family Communication:  Discussed with patient Disposition Plan: Expect patient to be discharged to home after condition improves Consults called:    GI per EDP Admission status: Telemetry observation   Sharene Butters, PA-C [facility]   Amion text  [phone]   11/29/2017, 8:19 AM

## 2017-11-29 NOTE — ED Notes (Signed)
Lab collected by [employer]

## 2017-11-29 NOTE — ED Notes (Signed)
Pt came back from imaging. She states that her cell phone was with her on the bed. It was not with her when she returned back to her room in room C20. If found please return. Thank you.

## 2017-11-29 NOTE — ED Triage Notes (Signed)
Pt BIB [facility] d/t Tachycardia noted on Fitbit watch 130s w/ chest discomfort, N/V. Pt also has c/o dark reddish brown stool, post new Rx.  Pt has Hx of CHF

## 2017-11-30 ENCOUNTER — Observation Stay (HOSPITAL_COMMUNITY): Payer: Medicare Other

## 2017-11-30 ENCOUNTER — Encounter (HOSPITAL_COMMUNITY): Payer: Self-pay | Admitting: *Deleted

## 2017-11-30 ENCOUNTER — Telehealth: Payer: Self-pay | Admitting: Pulmonary Disease

## 2017-11-30 ENCOUNTER — Telehealth (HOSPITAL_COMMUNITY): Payer: Self-pay | Admitting: *Deleted

## 2017-11-30 ENCOUNTER — Encounter (HOSPITAL_COMMUNITY)
Admission: EM | Disposition: A | Discharge status: Home / Self Care | Payer: Self-pay | Source: Home / Self Care | Attending: Internal Medicine

## 2017-11-30 DIAGNOSIS — G4733 Obstructive sleep apnea (adult) (pediatric): Secondary | ICD-10-CM

## 2017-11-30 DIAGNOSIS — Z01818 Encounter for other preprocedural examination: Secondary | ICD-10-CM | POA: Diagnosis not present

## 2017-11-30 DIAGNOSIS — K5791 Diverticulosis of intestine, part unspecified, without perforation or abscess with bleeding: Secondary | ICD-10-CM | POA: Diagnosis not present

## 2017-11-30 DIAGNOSIS — K625 Hemorrhage of anus and rectum: Secondary | ICD-10-CM

## 2017-11-30 DIAGNOSIS — H3530 Unspecified macular degeneration: Secondary | ICD-10-CM | POA: Diagnosis present

## 2017-11-30 DIAGNOSIS — F0631 Mood disorder due to known physiological condition with depressive features: Secondary | ICD-10-CM | POA: Diagnosis not present

## 2017-11-30 DIAGNOSIS — R4182 Altered mental status, unspecified: Secondary | ICD-10-CM | POA: Diagnosis not present

## 2017-11-30 DIAGNOSIS — G459 Transient cerebral ischemic attack, unspecified: Secondary | ICD-10-CM | POA: Diagnosis not present

## 2017-11-30 DIAGNOSIS — I10 Essential (primary) hypertension: Secondary | ICD-10-CM | POA: Diagnosis not present

## 2017-11-30 DIAGNOSIS — I4891 Unspecified atrial fibrillation: Secondary | ICD-10-CM | POA: Diagnosis not present

## 2017-11-30 DIAGNOSIS — I959 Hypotension, unspecified: Secondary | ICD-10-CM | POA: Diagnosis present

## 2017-11-30 DIAGNOSIS — I5032 Chronic diastolic (congestive) heart failure: Secondary | ICD-10-CM | POA: Diagnosis not present

## 2017-11-30 DIAGNOSIS — I2510 Atherosclerotic heart disease of native coronary artery without angina pectoris: Secondary | ICD-10-CM | POA: Diagnosis present

## 2017-11-30 DIAGNOSIS — E785 Hyperlipidemia, unspecified: Secondary | ICD-10-CM | POA: Diagnosis present

## 2017-11-30 DIAGNOSIS — K219 Gastro-esophageal reflux disease without esophagitis: Secondary | ICD-10-CM | POA: Diagnosis present

## 2017-11-30 DIAGNOSIS — K648 Other hemorrhoids: Secondary | ICD-10-CM | POA: Diagnosis present

## 2017-11-30 DIAGNOSIS — N183 Chronic kidney disease, stage 3 (moderate): Secondary | ICD-10-CM | POA: Diagnosis present

## 2017-11-30 DIAGNOSIS — I2721 Secondary pulmonary arterial hypertension: Secondary | ICD-10-CM | POA: Diagnosis present

## 2017-11-30 DIAGNOSIS — K5730 Diverticulosis of large intestine without perforation or abscess without bleeding: Secondary | ICD-10-CM | POA: Diagnosis not present

## 2017-11-30 DIAGNOSIS — K921 Melena: Secondary | ICD-10-CM | POA: Diagnosis not present

## 2017-11-30 DIAGNOSIS — I481 Persistent atrial fibrillation: Secondary | ICD-10-CM | POA: Diagnosis not present

## 2017-11-30 DIAGNOSIS — I130 Hypertensive heart and chronic kidney disease with heart failure and stage 1 through stage 4 chronic kidney disease, or unspecified chronic kidney disease: Secondary | ICD-10-CM | POA: Diagnosis not present

## 2017-11-30 DIAGNOSIS — K922 Gastrointestinal hemorrhage, unspecified: Secondary | ICD-10-CM | POA: Diagnosis not present

## 2017-11-30 DIAGNOSIS — R4701 Aphasia: Secondary | ICD-10-CM | POA: Diagnosis not present

## 2017-11-30 DIAGNOSIS — I422 Other hypertrophic cardiomyopathy: Secondary | ICD-10-CM | POA: Diagnosis not present

## 2017-11-30 DIAGNOSIS — R002 Palpitations: Secondary | ICD-10-CM | POA: Diagnosis not present

## 2017-11-30 DIAGNOSIS — I480 Paroxysmal atrial fibrillation: Secondary | ICD-10-CM | POA: Diagnosis not present

## 2017-11-30 DIAGNOSIS — E039 Hypothyroidism, unspecified: Secondary | ICD-10-CM | POA: Diagnosis present

## 2017-11-30 DIAGNOSIS — Z531 Procedure and treatment not carried out because of patient's decision for reasons of belief and group pressure: Secondary | ICD-10-CM | POA: Diagnosis present

## 2017-11-30 DIAGNOSIS — J9611 Chronic respiratory failure with hypoxia: Secondary | ICD-10-CM | POA: Diagnosis not present

## 2017-11-30 DIAGNOSIS — R479 Unspecified speech disturbances: Secondary | ICD-10-CM | POA: Diagnosis not present

## 2017-11-30 DIAGNOSIS — J449 Chronic obstructive pulmonary disease, unspecified: Secondary | ICD-10-CM | POA: Diagnosis present

## 2017-11-30 DIAGNOSIS — D62 Acute posthemorrhagic anemia: Secondary | ICD-10-CM | POA: Diagnosis not present

## 2017-11-30 HISTORY — PX: FLEXIBLE SIGMOIDOSCOPY: SHX5431

## 2017-11-30 LAB — CBC
HCT: 31.3 % — ABNORMAL LOW (ref 36.0–46.0)
HEMATOCRIT: 33 % — AB (ref 36.0–46.0)
HEMATOCRIT: 28 % — AB (ref 36.0–46.0)
HEMOGLOBIN: 10.9 g/dL — AB (ref 12.0–15.0)
HEMOGLOBIN: 9.3 g/dL — AB (ref 12.0–15.0)
Hemoglobin: 9.8 g/dL — ABNORMAL LOW (ref 12.0–15.0)
MCH: 30.2 pg (ref 26.0–34.0)
MCH: 30.1 pg (ref 26.0–34.0)
MCH: 30.5 pg (ref 26.0–34.0)
MCHC: 33 g/dL (ref 30.0–36.0)
MCHC: 31.3 g/dL (ref 30.0–36.0)
MCHC: 33.2 g/dL (ref 30.0–36.0)
MCV: 91.4 fL (ref 78.0–100.0)
MCV: 96 fL (ref 78.0–100.0)
MCV: 91.8 fL (ref 78.0–100.0)
PLATELETS: 237 10*3/uL (ref 150–400)
Platelets: 257 10*3/uL (ref 150–400)
Platelets: 234 10*3/uL (ref 150–400)
RBC: 3.61 MIL/uL — AB (ref 3.87–5.11)
RBC: 3.26 MIL/uL — ABNORMAL LOW (ref 3.87–5.11)
RBC: 3.05 MIL/uL — ABNORMAL LOW (ref 3.87–5.11)
RDW: 16.2 % — ABNORMAL HIGH (ref 11.5–15.5)
RDW: 16.7 % — AB (ref 11.5–15.5)
RDW: 16.1 % — AB (ref 11.5–15.5)
WBC: 11.1 10*3/uL — ABNORMAL HIGH (ref 4.0–10.5)
WBC: 11.7 10*3/uL — AB (ref 4.0–10.5)
WBC: 13.6 10*3/uL — ABNORMAL HIGH (ref 4.0–10.5)

## 2017-11-30 LAB — COMPREHENSIVE METABOLIC PANEL
ALK PHOS: 46 U/L (ref 38–126)
ALT: 40 U/L (ref 0–44)
AST: 22 U/L (ref 15–41)
Albumin: 2.9 g/dL — ABNORMAL LOW (ref 3.5–5.0)
Anion gap: 6 (ref 5–15)
BILIRUBIN TOTAL: 0.9 mg/dL (ref 0.3–1.2)
BUN: 29 mg/dL — AB (ref 8–23)
CO2: 18 mmol/L — ABNORMAL LOW (ref 22–32)
CREATININE: 1.19 mg/dL — AB (ref 0.44–1.00)
Calcium: 8.1 mg/dL — ABNORMAL LOW (ref 8.9–10.3)
Chloride: 114 mmol/L — ABNORMAL HIGH (ref 98–111)
GFR calc Af Amer: 52 mL/min — ABNORMAL LOW (ref >60)
GFR calc non Af Amer: 44 mL/min — ABNORMAL LOW (ref >60)
GLUCOSE: 87 mg/dL (ref 70–99)
Potassium: 4.4 mmol/L (ref 3.5–5.1)
Sodium: 138 mmol/L (ref 135–145)
TOTAL PROTEIN: 5.3 g/dL — AB (ref 6.5–8.1)

## 2017-11-30 LAB — GLUCOSE, CAPILLARY: Glucose-Capillary: 78 mg/dL (ref 70–99)

## 2017-11-30 LAB — MRSA PCR SCREENING: MRSA BY PCR: NEGATIVE

## 2017-11-30 SURGERY — SIGMOIDOSCOPY, FLEXIBLE

## 2017-11-30 MED ORDER — FLEET ENEMA 7-19 GM/118ML RE ENEM
ENEMA | RECTAL | Status: AC
  Filled 2017-11-30: qty 1

## 2017-11-30 MED ORDER — SODIUM CHLORIDE 0.9 % IV BOLUS
1000.0000 mL | Freq: Once | INTRAVENOUS | Status: AC
  Administered 2017-11-30: 1000 mL via INTRAVENOUS

## 2017-11-30 MED ORDER — MIDAZOLAM HCL 5 MG/ML IJ SOLN
INTRAMUSCULAR | Status: AC
  Filled 2017-11-30: qty 2

## 2017-11-30 MED ORDER — FENTANYL CITRATE (PF) 100 MCG/2ML IJ SOLN
INTRAMUSCULAR | Status: AC
  Filled 2017-11-30: qty 2

## 2017-11-30 MED ORDER — FLEET ENEMA 7-19 GM/118ML RE ENEM
1.0000 | ENEMA | Freq: Once | RECTAL | Status: AC
  Administered 2017-11-30: 1 via RECTAL

## 2017-11-30 MED ORDER — TECHNETIUM TC 99M-LABELED RED BLOOD CELLS IV KIT
25.8000 | PACK | Freq: Once | INTRAVENOUS | Status: AC | PRN
  Administered 2017-11-30: 25.8 via INTRAVENOUS

## 2017-11-30 NOTE — Op Note (Signed)
[facility] Patient Name: Amy [name] Procedure Date : 11/30/2017 MRN: [id number] Attending MD: Otis Brace , MD Date of Birth: February 11, 1946 CSN: [id number] Age: 72 Admit Type: Inpatient Procedure:                Flexible Sigmoidoscopy Indications:              Rectal hemorrhage Providers:                Otis Brace, MD, Debara Pickett., Technician Referring MD:              Medicines:                None Complications:            No immediate complications. Estimated Blood Loss:     Estimated blood loss: none. Procedure:                Pre-Anesthesia Assessment:                           - Prior to the procedure, a History and Physical                            was performed, and patient medications and                            allergies were reviewed. The patient's tolerance of                            previous anesthesia was also reviewed. The risks                            and benefits of the procedure and the sedation                            options and risks were discussed with the patient.                            All questions were answered, and informed consent                            was obtained. Prior Anticoagulants: The patient has                            taken Xarelto (rivaroxaban), last dose was 1 day                            prior to procedure. After reviewing the risks and                            benefits, the patient was deemed in satisfactory                            condition to  undergo the procedure.                           - Prior to the procedure, no anesthesia or sedation                            was planned.                           After obtaining informed consent, the scope was                            passed under direct vision. The [id number]-[id number] ([id number])                            Olympus adult EGD was introduced through the anus         and advanced to the 35 to 40 cm from the anal                            verge. The flexible sigmoidoscopy was accomplished                            without difficulty. The patient tolerated the                            procedure well. The quality of the bowel                            preparation was fair. Scope In: Scope Out: Findings:      The perianal and digital rectal examinations were normal.      Red and clotted blood was found at 40 cm proximal to the anus without       evidence of active bleeding .      Multiple large-mouthed diverticula were found in the sigmoid colon.       There was evidence of an impacted diverticulum at 35 cm.      The retroflexed view of the distal rectum and anal verge was normal and       showed no anal or rectal abnormalities. Impression:               - Preparation of the colon was fair.                           - Blood up to 40 cm proximal to the anus. No                            evidence of active bleeding .                           - Diverticulosis in the sigmoid colon. There was                            evidence of an impacted diverticulum.                           -  No specimens collected. Recommendation:           - Return patient to hospital ward for ongoing care.                           - Resume previous diet.                           - Do a GI bleeding (tagged RBC) scan today. Procedure Code(s):        --- Professional ---                           [phone], Sigmoidoscopy, flexible; diagnostic,                            including collection of specimen(s) by brushing or                            washing, when performed (separate procedure) Diagnosis Code(s):        --- Professional ---                           K92.2, Gastrointestinal hemorrhage, unspecified                           K62.5, Hemorrhage of anus and rectum                           K57.30, Diverticulosis of large intestine without                             perforation or abscess without bleeding CPT copyright 2017 American Medical Association. All rights reserved. The codes documented in this report are preliminary and upon coder review may  be revised to meet current compliance requirements. Otis Brace, MD Otis Brace, MD 11/30/2017 10:41:32 AM Number of Addenda: 0

## 2017-11-30 NOTE — Progress Notes (Signed)
[facility] Progress Note  Amy [name] 72 y.o. 11-19-1945  CC:  Rectal bleeding   Subjective:she continues to have rectal bleeding. She denies any chest pain or worsening shortness of breath. Denies any abdominal pain.   Objective: Vital signs in last 24 hours: Vitals:   11/30/17 0812 11/30/17 0925  BP:  106/64  Pulse:    Resp:  17  Temp:  98.1 F (36.7 C)  SpO2: 96% 100%    Physical Exam:  Gen. Alert/03. Not in acute distress Abdomen. Soft, nontender, nondistended, bowel sounds present.  Lab Results: Recent Labs    11/29/17 0550 11/30/17 0413  NA 139 138  K 3.9 4.4  CL 112* 114*  CO2 16* 18*  GLUCOSE 69* 87  BUN 32* 29*  CREATININE 1.30* 1.19*  CALCIUM 8.1* 8.1*   Recent Labs    11/29/17 0550 11/30/17 0413  AST 26 22  ALT 42 40  ALKPHOS 48 46  BILITOT 0.7 0.9  PROT 5.6* 5.3*  ALBUMIN 3.1* 2.9*   Recent Labs    11/29/17 0550  11/29/17 2214 11/30/17 0413  WBC 10.3   < > 11.6* 11.1*  NEUTROABS 7.6  --   --   --   HGB 12.7   < > 11.6* 10.9*  HCT 41.0   < > 35.6* 33.0*  MCV 96.5   < > 92.7 91.4  PLT 273   < > 270 257   < > = values in this interval not displayed.   Recent Labs    11/29/17 0550  LABPROT 23.3*  INR 2.09      Assessment/Plan: - painless rectal bleeding while on Xarelto..most likely hemorrhoidal vs  diverticular in nature. - acute blood loss anemia. Mild drop in hemoglobin noted - Bloody diarrhea.- C. Difficile and GI pathogen panel negative. -  atrial fibrillation. Last dose of Xarelto  08/24  - History of pulmonary hypertension. - History of atypical cardiomyopathy. 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 Negative virtual colonoscopy last year. - mildly elevated troponins. Patient denies any new chest pain.  Recommendations ------------------------- - proceed with unsedated flexible sigmoidoscopy today .  - continue to hold anticoagulation. - Monitor H&H.   Risks (bleeding, infection,  bowel perforation that could require surgery, sedation-related changes in cardiopulmonary systems), benefits (identification and possible treatment of source of symptoms, exclusion of certain causes of symptoms), and alternatives (watchful waiting, radiographic imaging studies, empiric medical treatment)  were explained to patient/family in detail and patient wishes to proceed.   Otis Brace MD, [facility] 11/30/2017, 10:01 AM  Contact #  [phone]

## 2017-11-30 NOTE — Telephone Encounter (Signed)
Called patient, unable to reach. Left message to give [name] a call back. Will route to BQ as an [place]

## 2017-11-30 NOTE — Progress Notes (Signed)
Fleet enema administered in Endoscopy preprocedure per Dr. Alessandra Bevels order.  Enema tip inserted without difficulty, patient denied any pain, bright red blood noted on tip after withdrawl. Results bloody with clots.

## 2017-11-30 NOTE — Progress Notes (Signed)
Pt transferred to 6E21 via bed in stable condition, vss, pepcid infusion completed and ns bolus restarted at 280m hr as pt does have hx of  CHF, report given to [employer] All belongings with pt

## 2017-11-30 NOTE — Telephone Encounter (Signed)
Pt cld to notify Dr. Aundra Dubin and his staff she is currently in the hospital with "profuse" rectal bleeding and they are going to do a procedure to see where this is all coming from.  She just wanted her physician to be aware.  Will forward to CHF nursing for [facility]

## 2017-11-30 NOTE — Progress Notes (Signed)
Placed patient on CPAP via FFM, 10.0 cm h20 per patient comfort with 4 lpm O2 (home O2).  Tolerating well at this time.

## 2017-11-30 NOTE — Progress Notes (Signed)
PROGRESS NOTE  Amy [name] ESP:[id number] DOB: 1945/12/15 DOA: 11/29/2017 PCP: Aretta Nip, MD   LOS: 0 days   Brief Narrative / Interim history: Amy [name] is a 72 y.o. female with medical history significant for paroxysmal atrial fibrillation on Xarelto, hypertension, hyperlipidemia, hypothyroidism diastolic heart failure, CAD, with catheterization in October 07, 2017, nonobstructive CAD medical management, OSA on CPAP, as well as apical H COM by cardiac MRI, with mild to moderate pulmonary hypertension on chronic O2 4L [place], on CellCept, OPSUMIT Uptravi, followed by cardiology and pulmonology, presenting to the emergency department   Assessment & Plan: Active Problems:   Asthmatic bronchitis , chronic (HCC)   Hypothyroidism   Essential hypertension   Apical variant hypertrophic cardiomyopathy (HCC)   Chronic diastolic heart failure (HCC)   OSA (obstructive sleep apnea)   CAD (coronary artery disease)   PAF (paroxysmal atrial fibrillation) (HCC)   Chronic respiratory failure with hypoxia (HCC)   GIB (gastrointestinal bleeding)   Acute lower GI bleeding -ongoing, 6 episodes overnight as well as this morning. GI consulted, s/p flex sig this morning, unable to visualize source and now pending nuclear scan.  -post procedure became hypotensive, lethargic. Re-assessed bedside, will transfer to stepdown for close monitoring. Blood pressure improving with fluids.  -IR consult if scan positive.   Elevated troponin -likely demand, flat trend  Chronic diastolic CHF / Pulmonary hypertension / Chronic hypoxic respiratory failure -last echo with normal EF and grade 3 DD -watch closely while getting fluids  CAD -cath July 2019, non obstructive CAD  H COM by cardiac MRI, with mild to moderate pulmonary hypertension, P-ANCA+, CCP+, RO-52+, CellCept, OPSUMIT Uptravi, Multaq  PAF -hold Xarelto, currently in sinus  CKD 3 -Cr stable  OSA -on CPAP  HLD -Continue  statins  Hypothyroidism -continue synthroid  COPD  -no wheezing    DVT prophylaxis: SCDs Code Status: Full code Family Communication: no family bedside Disposition Plan: home when ready   Consultants:   GI  Procedures:   Flex sig  Antimicrobials:  none   Subjective: - no chest pain, shortness of breath, no abdominal pain, nausea or vomiting.    Objective: Vitals:   11/30/17 1025 11/30/17 1030 11/30/17 1038 11/30/17 1135  BP: (!) 97/43 (!) 96/37 (!) 87/39 104/61  Pulse: 64 62  (!) 59  Resp: _0 Temp:   97.8 F (36.6 C)   TempSrc:   Oral   SpO2: 100% 100% 100% 100%  Weight:      Height:        Intake/Output Summary (Last 24 hours) at 11/30/2017 1221 Last data filed at 11/30/2017 0839 Gross per 24 hour  Intake 332.17 ml  Output 1350 ml  Net -1017.83 ml   Filed Weights   11/29/17 0844 11/30/17 0559 11/30/17 0925  Weight: 85.5 kg 84.8 kg 84.4 kg    Examination:  Constitutional: NAD Eyes:  lids and conjunctivae normal ENMT: Mucous membranes are moist.  Neck: normal, supple, no masses, no thyromegaly Respiratory: clear to auscultation bilaterally, no wheezing, no crackles.  Cardiovascular: Regular rate and rhythm, no murmurs / rubs / gallops. No LE edema.  Abdomen: no tenderness. Bowel sounds positive.  Skin: no rashes Neurologic: CN 2-12 grossly intact. Strength 5/5 in all 4.  Psychiatric: Normal judgment and insight. Alert and oriented x 3. Normal mood.    Data Reviewed: I have independently reviewed following labs and imaging studies   CBC: Recent Labs  Lab 11/24/17 1351 11/29/17 0550 11/29/17  1553 11/29/17 2214 11/30/17 0413 11/30/17 1108  WBC 10.2 10.3 11.1* 11.6* 11.1* 11.7*  NEUTROABS 8.8* 7.6  --   --   --   --   HGB 13.1 12.7 11.5* 11.6* 10.9* 9.8*  HCT 40.7 41.0 35.1* 35.6* 33.0* 31.3*  MCV 94.4 96.5 92.1 92.7 91.4 96.0  PLT 299.0 273 276 270 257 193   Basic Metabolic Panel: Recent Labs  Lab 11/24/17 1351  11/29/17 0550 11/30/17 0413  NA 136 139 138  K 4.3 3.9 4.4  CL 105 112* 114*  CO2 25 16* 18*  GLUCOSE 82 69* 87  BUN 33* 32* 29*  CREATININE 1.44* 1.30* 1.19*  CALCIUM 8.9 8.1* 8.1*   GFR: Estimated Creatinine Clearance: 44 mL/min (A) (by C-G formula based on SCr of 1.19 mg/dL (H)). Liver Function Tests: Recent Labs  Lab 11/24/17 1351 11/29/17 0550 11/30/17 0413  AST _0 ALT 43* 42 40  ALKPHOS 58 48 46  BILITOT 0.4 0.7 0.9  PROT 6.4 5.6* 5.3*  ALBUMIN 3.7 3.1* 2.9*   Recent Labs  Lab 11/29/17 0550  LIPASE 46   No results for input(s): AMMONIA in the last 168 hours. Coagulation Profile: Recent Labs  Lab 11/29/17 0550  INR 2.09   Cardiac Enzymes: Recent Labs  Lab 11/29/17 0550 11/29/17 1209 11/29/17 1553  TROPONINI 0.11* 0.15* 0.16*   BNP (last 3 results) No results for input(s): PROBNP in the last 8760 hours. HbA1C: No results for input(s): HGBA1C in the last 72 hours. CBG: Recent Labs  Lab 11/30/17 1129  GLUCAP 78   Lipid Profile: No results for input(s): CHOL, HDL, LDLCALC, TRIG, CHOLHDL, LDLDIRECT in the last 72 hours. Thyroid Function Tests: No results for input(s): TSH, T4TOTAL, FREET4, T3FREE, THYROIDAB in the last 72 hours. Anemia Panel: No results for input(s): VITAMINB12, FOLATE, FERRITIN, TIBC, IRON, RETICCTPCT in the last 72 hours. Urine analysis:    Component Value Date/Time   COLORURINE STRAW (A) 10/01/2016 1537   APPEARANCEUR CLEAR 10/01/2016 1537   LABSPEC 1.005 10/01/2016 1537   PHURINE 5.0 10/01/2016 1537   GLUCOSEU NEGATIVE 10/01/2016 1537   HGBUR NEGATIVE 10/01/2016 1537   BILIRUBINUR NEGATIVE 10/01/2016 1537   KETONESUR NEGATIVE 10/01/2016 1537   PROTEINUR NEGATIVE 10/01/2016 1537   NITRITE NEGATIVE 10/01/2016 1537   LEUKOCYTESUR NEGATIVE 10/01/2016 1537   Sepsis Labs: Invalid input(s): PROCALCITONIN, LACTICIDVEN  Recent Results (from the past 240 hour(s))  C difficile quick scan w PCR reflex     Status: None    Collection Time: 11/29/17  8:12 AM  Result Value Ref Range Status   C Diff antigen NEGATIVE NEGATIVE Final   C Diff toxin NEGATIVE NEGATIVE Final   C Diff interpretation No C. difficile detected.  Final    Comment: Performed at [facility], [place] [address], [place], [place] [address]  Gastrointestinal Panel by PCR , Stool     Status: None   Collection Time: 11/29/17  8:12 AM  Result Value Ref Range Status   Campylobacter species NOT DETECTED NOT DETECTED Final   Plesimonas shigelloides NOT DETECTED NOT DETECTED Final   Salmonella species NOT DETECTED NOT DETECTED Final   Yersinia enterocolitica NOT DETECTED NOT DETECTED Final   Vibrio species NOT DETECTED NOT DETECTED Final   Vibrio cholerae NOT DETECTED NOT DETECTED Final   Enteroaggregative E coli (EAEC) NOT DETECTED NOT DETECTED Final   Enteropathogenic E coli (EPEC) NOT DETECTED NOT DETECTED Final   Enterotoxigenic E coli (ETEC) NOT DETECTED NOT  DETECTED Final   Shiga like toxin producing E coli (STEC) NOT DETECTED NOT DETECTED Final   Shigella/Enteroinvasive E coli (EIEC) NOT DETECTED NOT DETECTED Final   Cryptosporidium NOT DETECTED NOT DETECTED Final   Cyclospora cayetanensis NOT DETECTED NOT DETECTED Final   Entamoeba histolytica NOT DETECTED NOT DETECTED Final   Giardia lamblia NOT DETECTED NOT DETECTED Final   Adenovirus F40/41 NOT DETECTED NOT DETECTED Final   Astrovirus NOT DETECTED NOT DETECTED Final   Norovirus GI/GII NOT DETECTED NOT DETECTED Final   Rotavirus A NOT DETECTED NOT DETECTED Final   Sapovirus (I, II, IV, and V) NOT DETECTED NOT DETECTED Final    Comment: Performed at [facility], [address], [place], [place] [address]      Radiology Studies: Dg Chest 2 View  Result Date: 11/29/2017 CLINICAL DATA:  Shortness of breath.  Chest discomfort. EXAM: CHEST - 2 VIEW COMPARISON:  Radiograph 10/06/2017.  CT 06/08/2017 FINDINGS: Cardiomegaly is unchanged from prior exam. Unchanged  mediastinal contours with aortic atherosclerosis. No pulmonary edema. Scarring in the left mid lung. No focal airspace disease or pleural effusion. No pneumothorax. Unchanged osseous structures. IMPRESSION: Chronic cardiomegaly and Aortic Atherosclerosis (ICD10-I70.0). No CHF or acute pulmonary process. Electronically Signed   By: Jeb Levering M.D.   On: 11/29/2017 06:22     Scheduled Meds: . atorvastatin  80 mg Oral Q lunch  . colchicine  0.6 mg Oral Daily  . dronedarone  400 mg Oral BID WC  . ezetimibe  10 mg Oral Daily  . ipratropium-albuterol  3 mL Nebulization TID  . levothyroxine  25 mcg Oral QAC breakfast  . macitentan  10 mg Oral Daily  . mycophenolate  1,000 mg Oral BID  . potassium chloride SA  10 mEq Oral QHS  . predniSONE  10 mg Oral BID WC  . Selexipag  1,600 mcg Oral BID   Continuous Infusions: . famotidine (PEPCID) IV Stopped (11/30/17 1214)  . sodium chloride 1,000 mL (11/30/17 1130)     Marzetta Board, MD, PhD [facility] Pager [phone]  If 7PM-7AM, please contact night-coverage [email or web address] Password [facility] 11/30/2017, 12:21 PM

## 2017-11-30 NOTE — Progress Notes (Signed)
Endo nurse called to inquire why the fleets enema was not given this am, I advised I did not see order for fleets enema, upon order review fleets order 8/26 0926 when pt was already off unit to endo

## 2017-11-30 NOTE — Telephone Encounter (Signed)
Pt is in 3E22, will send to Dr Aundra Dubin

## 2017-12-01 ENCOUNTER — Other Ambulatory Visit (HOSPITAL_COMMUNITY): Payer: Self-pay | Admitting: Cardiology

## 2017-12-01 ENCOUNTER — Encounter (HOSPITAL_COMMUNITY): Payer: Self-pay | Admitting: Gastroenterology

## 2017-12-01 ENCOUNTER — Encounter (HOSPITAL_COMMUNITY): Payer: Self-pay

## 2017-12-01 DIAGNOSIS — Z01818 Encounter for other preprocedural examination: Secondary | ICD-10-CM

## 2017-12-01 DIAGNOSIS — I2510 Atherosclerotic heart disease of native coronary artery without angina pectoris: Secondary | ICD-10-CM

## 2017-12-01 DIAGNOSIS — I4891 Unspecified atrial fibrillation: Secondary | ICD-10-CM

## 2017-12-01 LAB — CBC
HCT: 27.1 % — ABNORMAL LOW (ref 36.0–46.0)
HCT: 27.1 % — ABNORMAL LOW (ref 36.0–46.0)
HEMATOCRIT: 25.8 % — AB (ref 36.0–46.0)
HEMOGLOBIN: 8.3 g/dL — AB (ref 12.0–15.0)
Hemoglobin: 8.6 g/dL — ABNORMAL LOW (ref 12.0–15.0)
Hemoglobin: 8.6 g/dL — ABNORMAL LOW (ref 12.0–15.0)
MCH: 30.2 pg (ref 26.0–34.0)
MCH: 29.9 pg (ref 26.0–34.0)
MCH: 30 pg (ref 26.0–34.0)
MCHC: 31.7 g/dL (ref 30.0–36.0)
MCHC: 31.7 g/dL (ref 30.0–36.0)
MCHC: 32.2 g/dL (ref 30.0–36.0)
MCV: 93.8 fL (ref 78.0–100.0)
MCV: 94.1 fL (ref 78.0–100.0)
MCV: 94.4 fL (ref 78.0–100.0)
PLATELETS: 211 10*3/uL (ref 150–400)
Platelets: 206 10*3/uL (ref 150–400)
Platelets: 210 10*3/uL (ref 150–400)
RBC: 2.75 MIL/uL — ABNORMAL LOW (ref 3.87–5.11)
RBC: 2.87 MIL/uL — ABNORMAL LOW (ref 3.87–5.11)
RBC: 2.88 MIL/uL — ABNORMAL LOW (ref 3.87–5.11)
RDW: 16.6 % — AB (ref 11.5–15.5)
RDW: 16.6 % — AB (ref 11.5–15.5)
RDW: 16.6 % — ABNORMAL HIGH (ref 11.5–15.5)
WBC: 11.6 10*3/uL — ABNORMAL HIGH (ref 4.0–10.5)
WBC: 12.4 10*3/uL — AB (ref 4.0–10.5)
WBC: 13.7 10*3/uL — ABNORMAL HIGH (ref 4.0–10.5)

## 2017-12-01 MED ORDER — SODIUM CHLORIDE 0.9 % IV BOLUS
500.0000 mL | Freq: Once | INTRAVENOUS | Status: AC
  Administered 2017-12-01: 500 mL via INTRAVENOUS

## 2017-12-01 NOTE — Progress Notes (Signed)
Pt BP 86/56 HR 70.  Pt states slight dizziness.  No active bleeding.  NP notified with new orders.  Awaiting IV access to give bolus.  Pt alert and oriented X 4, skin warm and dry.  Will continue to monitor closely.

## 2017-12-01 NOTE — Progress Notes (Signed)
PROGRESS NOTE  Amy [name] MCN:[id number] DOB: 11/08/1945 DOA: 11/29/2017 PCP: Aretta Nip, MD   LOS: 1 day   Brief Narrative / Interim history: Amy [name] is a 72 y.o. female with medical history significant for paroxysmal atrial fibrillation on Xarelto, hypertension, hyperlipidemia, hypothyroidism diastolic heart failure, CAD, with catheterization in October 07, 2017, nonobstructive CAD medical management, OSA on CPAP, as well as apical H COM by cardiac MRI, with mild to moderate pulmonary hypertension on chronic O2 4L [place], on CellCept, OPSUMIT Uptravi, followed by cardiology and pulmonology, presenting to the emergency department with bright red blood per rectum.  Gastroenterology was consulted.  Assessment & Plan: Active Problems:   Asthmatic bronchitis , chronic (HCC)   Hypothyroidism   Essential hypertension   Apical variant hypertrophic cardiomyopathy (HCC)   Chronic diastolic heart failure (HCC)   OSA (obstructive sleep apnea)   CAD (coronary artery disease)   PAF (paroxysmal atrial fibrillation) (HCC)   Chronic respiratory failure with hypoxia (HCC)   GIB (gastrointestinal bleeding)   Acute lower GI bleeding -This seems to be ongoing, the night before she had 6 episodes overnight associated with hypotension in the morning, requiring transfer to stepdown on 8/26.  GI consulted, s/p flex sig on 8/26, unable to visualize source.  This was followed by a nuclear scan which again did not show any evidence of bleeding. -Discussed with Dr. Alessandra Bevels this morning, he wishes to proceed with colonoscopy however patient is very high risk.  He recommends cardiology consultation for clearance for colonoscopy, I have called cardiology -Of note, patient is a Jehovah's Witness and does not want blood transfusions. -Continues to have bleeding and this morning when I was in the room patient just had a large bloody bowel movement.  Monitor in stepdown, vital signs are stable  and her hemoglobin seems to have stabilized overnight as well but she is a very high risk of decompensation -Patient feels like the GI bleed was in the setting of her increasing her home CellCept, will hold for now, defer to GI as to whether CellCept can increase the risk of bleeding  Elevated troponin -likely demand, flat trend  Chronic diastolic CHF / Pulmonary hypertension / Chronic hypoxic respiratory failure -last echo with normal EF and grade 3 DD -She received IV fluids yesterday when she was hypotensive, she seems to be tolerating it well, she appears euvolemic this morning and she is on 4 L nasal cannula oxygen which is her baseline -Continue to closely monitor volume status, will not place on continuous fluids for now  CAD -cath July 2019, non obstructive CAD  H COM by cardiac MRI, with mild to moderate pulmonary hypertension, P-ANCA+, CCP+, RO-52+ -Concerned that the patient may have an underlying autoimmune disease for which she is on CellCept, now on hold as above  PAF -hold Xarelto, currently in sinus  CKD 3 -Cr stable  OSA -on CPAP  HLD -Continue statins  Hypothyroidism -continue synthroid  COPD  -no wheezing    DVT prophylaxis: SCDs Code Status: Full code Family Communication: no family bedside Disposition Plan: home when ready   Consultants:   GI  Procedures:   Flex sig  Antimicrobials:  none   Subjective: -She is somewhat frustrated about the ongoing bleed.  Denies any chest pain, complaints of breathing difficulties with ambulation but that is normal for her.  She is lightheaded at times.  Objective: Vitals:   12/01/17 0329 12/01/17 0415 12/01/17 0754 12/01/17 0843  BP: (!) 94/44 (!) 103/46 Marland Kitchen)  97/52   Pulse:  65 74   Resp:  17 14   Temp: 98.4 F (36.9 C)  98.3 F (36.8 C)   TempSrc: Oral  Oral   SpO2: 92% 98% 100% 100%  Weight: 85 kg     Height:        Intake/Output Summary (Last 24 hours) at 12/01/2017 1028 Last data filed at  12/01/2017 0915 Gross per 24 hour  Intake 1080 ml  Output 800 ml  Net 280 ml   Filed Weights   11/30/17 0559 11/30/17 0925 12/01/17 0329  Weight: 84.8 kg 84.4 kg 85 kg    Examination:  Constitutional: She in no apparent distress, sitting down in a chair Eyes: No scleral icterus, lids and conjunctivae are normal ENMT: Moist mucous membranes Respiratory: Overall distant breath sounds but clear to auscultation without wheezing or crackles.  Normal respiratory effort. Cardiovascular: Regular rate and rhythm with extra beats, no murmurs heard.  Trace peripheral edema. Abdomen: Soft, nontender, nondistended, positive bowel sounds Skin: No rashes seen Neurologic: Nonfocal, equal strength, ambulatory Psychiatric: Normal judgment and insight. Alert and oriented x 3. Normal mood.    Data Reviewed: I have independently reviewed following labs and imaging studies   CBC: Recent Labs  Lab 11/24/17 1351 11/29/17 0550  11/30/17 0413 11/30/17 1108 11/30/17 1701 11/30/17 2343 12/01/17 0823  WBC 10.2 10.3   < > 11.1* 11.7* 13.6* 11.6* 12.4*  NEUTROABS 8.8* 7.6  --   --   --   --   --   --   HGB 13.1 12.7   < > 10.9* 9.8* 9.3* 8.3* 8.6*  HCT 40.7 41.0   < > 33.0* 31.3* 28.0* 25.8* 27.1*  MCV 94.4 96.5   < > 91.4 96.0 91.8 93.8 94.4  PLT 299.0 273   < > 257 237 234 206 211   < > = values in this interval not displayed.   Basic Metabolic Panel: Recent Labs  Lab 11/24/17 1351 11/29/17 0550 11/30/17 0413  NA 136 139 138  K 4.3 3.9 4.4  CL 105 112* 114*  CO2 25 16* 18*  GLUCOSE 82 69* 87  BUN 33* 32* 29*  CREATININE 1.44* 1.30* 1.19*  CALCIUM 8.9 8.1* 8.1*   GFR: Estimated Creatinine Clearance: 44.1 mL/min (A) (by C-G formula based on SCr of 1.19 mg/dL (H)). Liver Function Tests: Recent Labs  Lab 11/24/17 1351 11/29/17 0550 11/30/17 0413  AST _0 ALT 43* 42 40  ALKPHOS 58 48 46  BILITOT 0.4 0.7 0.9  PROT 6.4 5.6* 5.3*  ALBUMIN 3.7 3.1* 2.9*   Recent Labs  Lab  11/29/17 0550  LIPASE 46   No results for input(s): AMMONIA in the last 168 hours. Coagulation Profile: Recent Labs  Lab 11/29/17 0550  INR 2.09   Cardiac Enzymes: Recent Labs  Lab 11/29/17 0550 11/29/17 1209 11/29/17 1553  TROPONINI 0.11* 0.15* 0.16*   BNP (last 3 results) No results for input(s): PROBNP in the last 8760 hours. HbA1C: No results for input(s): HGBA1C in the last 72 hours. CBG: Recent Labs  Lab 11/30/17 1129  GLUCAP 78   Lipid Profile: No results for input(s): CHOL, HDL, LDLCALC, TRIG, CHOLHDL, LDLDIRECT in the last 72 hours. Thyroid Function Tests: No results for input(s): TSH, T4TOTAL, FREET4, T3FREE, THYROIDAB in the last 72 hours. Anemia Panel: No results for input(s): VITAMINB12, FOLATE, FERRITIN, TIBC, IRON, RETICCTPCT in the last 72 hours. Urine analysis:    Component Value Date/Time   COLORURINE  STRAW (A) 10/01/2016 [place] 10/01/2016 1537   LABSPEC 1.005 10/01/2016 1537   PHURINE 5.0 10/01/2016 1537   GLUCOSEU NEGATIVE 10/01/2016 1537   HGBUR NEGATIVE 10/01/2016 1537   BILIRUBINUR NEGATIVE 10/01/2016 1537   KETONESUR NEGATIVE 10/01/2016 1537   PROTEINUR NEGATIVE 10/01/2016 1537   NITRITE NEGATIVE 10/01/2016 1537   LEUKOCYTESUR NEGATIVE 10/01/2016 1537   Sepsis Labs: Invalid input(s): PROCALCITONIN, LACTICIDVEN  Recent Results (from the past 240 hour(s))  C difficile quick scan w PCR reflex     Status: None   Collection Time: 11/29/17  8:12 AM  Result Value Ref Range Status   C Diff antigen NEGATIVE NEGATIVE Final   C Diff toxin NEGATIVE NEGATIVE Final   C Diff interpretation No C. difficile detected.  Final    Comment: Performed at [facility], [place] [address], [place], [place] [address]  Gastrointestinal Panel by PCR , Stool     Status: None   Collection Time: 11/29/17  8:12 AM  Result Value Ref Range Status   Campylobacter species NOT DETECTED NOT DETECTED Final   Plesimonas shigelloides NOT DETECTED  NOT DETECTED Final   Salmonella species NOT DETECTED NOT DETECTED Final   Yersinia enterocolitica NOT DETECTED NOT DETECTED Final   Vibrio species NOT DETECTED NOT DETECTED Final   Vibrio cholerae NOT DETECTED NOT DETECTED Final   Enteroaggregative E coli (EAEC) NOT DETECTED NOT DETECTED Final   Enteropathogenic E coli (EPEC) NOT DETECTED NOT DETECTED Final   Enterotoxigenic E coli (ETEC) NOT DETECTED NOT DETECTED Final   Shiga like toxin producing E coli (STEC) NOT DETECTED NOT DETECTED Final   Shigella/Enteroinvasive E coli (EIEC) NOT DETECTED NOT DETECTED Final   Cryptosporidium NOT DETECTED NOT DETECTED Final   Cyclospora cayetanensis NOT DETECTED NOT DETECTED Final   Entamoeba histolytica NOT DETECTED NOT DETECTED Final   Giardia lamblia NOT DETECTED NOT DETECTED Final   Adenovirus F40/41 NOT DETECTED NOT DETECTED Final   Astrovirus NOT DETECTED NOT DETECTED Final   Norovirus GI/GII NOT DETECTED NOT DETECTED Final   Rotavirus A NOT DETECTED NOT DETECTED Final   Sapovirus (I, II, IV, and V) NOT DETECTED NOT DETECTED Final    Comment: Performed at [facility], [address] [place], [place] [address]  MRSA PCR Screening     Status: None   Collection Time: 11/30/17 12:23 PM  Result Value Ref Range Status   MRSA by PCR NEGATIVE NEGATIVE Final    Comment:        The GeneXpert MRSA Assay (FDA approved for NASAL specimens only), is one component of a comprehensive MRSA colonization surveillance program. It is not intended to diagnose MRSA infection nor to guide or monitor treatment for MRSA infections. Performed at [facility], [place] [address], [place], [place] [address]       Radiology Studies: Nm Gi Blood Loss  Result Date: 11/30/2017 CLINICAL DATA:  72 year old female with rectal bleeding. EXAM: NUCLEAR MEDICINE GASTROINTESTINAL BLEEDING SCAN TECHNIQUE: Sequential abdominal images were obtained following intravenous administration of Tc-76mlabeled red  blood cells. RADIOPHARMACEUTICALS:  25.8 mCi Tc-947mertechnetate in-vitro labeled red cells. COMPARISON:  None. FINDINGS: No evidence of active gastrointestinal bleeding after 2 hours of imaging. Normal renal uptake and excretion into the bladder. IMPRESSION: Negative GI bleeding study. Electronically Signed   By: HeJacqulynn Cadet.D.   On: 11/30/2017 16:56     Scheduled Meds: . atorvastatin  80 mg Oral Q lunch  . colchicine  0.6  mg Oral Daily  . dronedarone  400 mg Oral BID WC  . ezetimibe  10 mg Oral Daily  . ipratropium-albuterol  3 mL Nebulization TID  . levothyroxine  25 mcg Oral QAC breakfast  . macitentan  10 mg Oral Daily  . potassium chloride SA  10 mEq Oral QHS  . predniSONE  10 mg Oral BID WC  . Selexipag  1,600 mcg Oral BID   Continuous Infusions: . famotidine (PEPCID) IV 20 mg (12/01/17 0850)     Marzetta Board, MD, PhD [facility] Pager [phone]  If 7PM-7AM, please contact night-coverage [email or web address] Password [facility] 12/01/2017, 10:28 AM

## 2017-12-01 NOTE — Consult Note (Addendum)
Cardiology Consultation:   Patient ID: Amy [name]; [id number]; Sep 09, 1945   Admit date: 11/29/2017 Date of Consult: 12/01/2017  Primary Care Provider: Aretta Nip, MD Primary Cardiologist: Dr. [name], MD  Primary Electrophysiologist:  Dr. Virl Axe, MD   Patient Profile:   Amy [name] is a 72 y.o. female with a hx of persistent atrial fibrillation on Xarelto and Multaq (s/p ablation per Dr. 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 Aundra Dubin and Dr. Lake Bells) Also a history of hyperlipidemia, hypothyroidism, diastolic heart failure, nonobstructive CAD per cath 10/2017 with recommendations for medical management and  OSA on CPAP who is being seen today for preoperative evaluation at the request of Drs Cruzita Lederer and Alessandra Bevels  History of Present Illness:   Amy [name] is a 72 year old female with a history as stated above who presented to [facility] on [facility]/29/2017 after an episode of central chest pressure which began around 8:30 PM on 11/28/2017.  Patient stated that it lasted for approximately 2 hours however, resolved after one episode of nausea and non-bloody vomiting. Patient then stated that she felt mild palpitations and her heart rate was in the 130s per fitness tracker. She then took one PRN dose of metoprolol and went to sleep after her symptoms subsided. She woke and had a bowel movement which was noted to be  Bloody.(BRBPR) Given this, she called EMS for further evaluation. She denied further chest pain complaints and had no reports of shortness of breath, recent fever or chills, recent GI infection, NSAID use, dizziness, PND, orthopnea or syncope. She was recently started on CellCept by Rheumatology (work-up for autoimmune cause for [facility] and pulmonary issues) . She has been on Xarelto without GI complaints for many years.    In the ED, VSS. EKG  performed which revealed NSR with LVH Troponin mildly elevated at 0.11.  She had Hemoccult positive stool. Hemoglobin stable at 12.7 on admission>>currently 8.6  Given multiple episodes of rectal bleeding, GI was consulted in which she underwent an unsedated flexible sigmoidoscopy on 11/30/2017 .  Unfortunately they were unable to visualize the source. A tagged RBC scan was then performed which again did not show any evidence of bleeding.  Per chart review, GI wishes to proceed with colonoscopy. 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    Of note, she was last seen in the atrial fibrillation clinic on 10/20/2017 after her hospital ED visit for symptomatic atrial fibrillation.  At that time, she had taken PRN metoprolol dose as instructed however, her episodes returned with heart rates in the 140s-160s.  By the time she was seen in the ED, she had reverted to sinus rhythm and was asked to follow-up in the AF clinic.   Pt  has chronic shortness of breath with exertion however, denied symptoms of chest pain, orthopnea, PND, presyncope, syncope, daytime somnolence, bleeding or neurologic sequela.  She was last seen by Dr. Aundra Dubin on 08/19/2017 for follow-up of CHF and pulmonary hypertension. She is on Opsumit and has started Selexipag, now titrated up to 1400 mcg am/1600 mcg pm. She was unable to tolerate Adcirca.  It was noted that she was participating in pulmonary rehab. Symptomatically, she had been worse.  Her oxygen requirement has increased to 3L to 4L at rest and 8L with exertion with worsening dyspnea. A right heart cath was performed 06/2017 which showed lower PA pressure  and normal right and left heart filling pressures however, worrisome given low CO.  She was then referred to Dr. Lake Bells with pulmonary medicine.  Labs signif for  weak P-ANCA positivity.  she was referred to rheumatology.  Dr. Lake Bells recommended echocardiogram with bubble and is considering referral for open lung  biopsy.  At her baseline, she remains short of breath with walking around the house and getting dressed, walking to the car etc. She wears 3-4L of supplemental O2 at rest and this has to be increased with an exertion. She has no complaints of orthopnea, PND,chest pain, lightheadedness or  syncope.    Past Medical History:  Diagnosis Date  . Apical variant hypertrophic cardiomyopathy ([place])    Diagnosed by echo and ECG 11/13  . Atypical atrial flutter ([place])   . CAD (coronary artery disease)    a. LHC in 10/2011 demonstrated non-obstructive disease and an 80% lesion in a small D1 treated medically.   . CHF (congestive heart failure) ([place])   . Chronic diastolic heart failure ([place])   . CKD (chronic kidney disease), stage III ([place])   . COPD (chronic obstructive pulmonary disease) ([place])   . Dizziness    had PT for being off balance - in approx. 2012  . GERD (gastroesophageal reflux disease)   . Hyperlipidemia   . Hypertension   . Hypothyroid   . Lumbar disc disease   . Macular degeneration   . OSA (obstructive sleep apnea)   . Persistent atrial fibrillation ([place])       . Pulmonary hypertension ([place])    a. [place] 05/2013 showed mild PAH with normal PCWP and RA pressure, could be related to OSA and low oxygen saturation.   . Refusal of blood transfusions as patient is Jehovah's Witness   . Sinus bradycardia     Past Surgical History:  Procedure Laterality Date  . CARDIAC CATHETERIZATION     normal coronary arteries  . CARDIAC CATHETERIZATION N/A 10/31/2014   Procedure: Left Heart Cath and Coronary Angiography;  Surgeon: Leonie Man, MD;  Location: [place] CV LAB;  Service: Cardiovascular;  Laterality: N/A;  . CATARACT EXTRACTION Bilateral   . CESAREAN SECTION  1983  . ELECTROPHYSIOLOGIC STUDY N/A 04/24/2015   Procedure: Atrial Fibrillation Ablation;  Surgeon: [name], MD;  Location: [place] CV LAB;  Service: Cardiovascular;  Laterality: N/A;  . FLEXIBLE SIGMOIDOSCOPY N/A  11/30/2017   Procedure: FLEXIBLE SIGMOIDOSCOPY;  Surgeon: Otis Brace, MD;  Location: [place];  Service: Gastroenterology;  Laterality: N/A;  . RIGHT HEART CATH N/A 06/25/2016   Procedure: Right Heart Cath;  Surgeon: Sherren Mocha, MD;  Location: [place] CV LAB;  Service: Cardiovascular;  Laterality: N/A;  . RIGHT HEART CATH N/A 06/10/2017   Procedure: RIGHT HEART CATH;  Surgeon: Larey Dresser, MD;  Location: [place] CV LAB;  Service: Cardiovascular;  Laterality: N/A;  . RIGHT HEART CATHETERIZATION N/A 06/01/2013   Procedure: RIGHT HEART CATH;  Surgeon: Larey Dresser, MD;  Location: [facility] CATH LAB;  Service: Cardiovascular;  Laterality: N/A;  . TEE WITHOUT CARDIOVERSION N/A 04/23/2015   Procedure: TRANSESOPHAGEAL ECHOCARDIOGRAM (TEE);  Surgeon: Josue Hector, MD;  Location: [facility] ENDOSCOPY;  Service: Cardiovascular;  Laterality: N/A;     Prior to Admission medications   Medication Sig Start Date End Date Taking? Authorizing Provider  acetaminophen (TYLENOL) 325 MG tablet Take 2 tablets (650 mg total) by mouth every 4 (four) hours as needed for headache or mild pain. 10/02/16  Yes Kilroy,  Doreene Burke, PA-C  atorvastatin (LIPITOR) 80 MG tablet Take 1 tablet (80 mg total) by mouth daily with lunch. 02/08/15  Yes Sherran Needs, NP  Cholecalciferol (VITAMIN D) 2000 UNITS CAPS Take 2,000 Units by mouth daily.    Yes [provider]  colchicine 0.6 MG tablet Take 0.6 mg by mouth daily.    Yes [provider]  ezetimibe (ZETIA) 10 MG tablet Take 1 tablet (10 mg total) by mouth daily. Please make overdue appt with Dr. Radford Pax before anymore refills. 2nd attempt 11/17/17  Yes Turner, Eber Hong, MD  furosemide (LASIX) 20 MG tablet Take 1 tablet (20 mg total) by mouth daily. 08/13/16  Yes Larey Dresser, MD  ipratropium-albuterol (DUONEB) 0.5-2.5 (3) MG/3ML SOLN Take 3 mLs by nebulization 3 (three) times daily. DX: J44.9 07/03/17  Yes Juanito Doom, MD  levothyroxine (SYNTHROID,  LEVOTHROID) 50 MCG tablet Take 25 mcg by mouth daily before breakfast.    Yes [provider]  Macitentan (OPSUMIT) 10 MG TABS Take 1 tablet (10 mg total) by mouth daily. 06/29/17  Yes Larey Dresser, MD  metoprolol tartrate (LOPRESSOR) 25 MG tablet Take 25 mg by mouth as needed. If having to take often for breakthrough, may take 12.5 twice a day.  Please contact office if change is made   Yes [provider]  Multiple Vitamin (MULTIVITAMIN) capsule Take 1 capsule by mouth daily.   Yes [provider]  mycophenolate (CELLCEPT) 500 MG tablet Take 1,000 mg by mouth 2 (two) times daily.    Yes Juanito Doom, MD  pantoprazole (PROTONIX) 40 MG tablet Take 1 tablet (40 mg total) by mouth daily. 08/05/17  Yes Larey Dresser, MD  potassium chloride SA (K-DUR,KLOR-CON) 20 MEQ tablet Take 0.5 tablets (10 mEq total) by mouth at bedtime. 10/02/16  Yes Kilroy, Luke K, PA-C  predniSONE (DELTASONE) 10 MG tablet Take 2 tablets (20 mg total) by mouth daily with breakfast. Patient taking differently: Take 10 mg by mouth 2 (two) times daily with a meal.  10/28/17  Yes McQuaid, Ronie Spies, MD  Selexipag (UPTRAVI) 800 MCG TABS Take 1,600 mcg by mouth 2 (two) times daily.    Yes [provider]  traMADol (ULTRAM) 50 MG tablet Take 50 mg by mouth every 8 (eight) hours as needed for moderate pain.   Yes [provider]  XARELTO 15 MG TABS tablet TAKE 1 TABLET BY MOUTH EVERY DAY WITH SUPPER Patient taking differently: Take 15 mg by mouth daily with supper.  09/02/17  Yes Larey Dresser, MD  cyclobenzaprine (FLEXERIL) 5 MG tablet Take 1 tablet (5 mg total) by mouth 3 (three) times daily as needed for muscle spasms. Patient not taking: Reported on 11/29/2017 06/26/16   Geradine Girt, DO  dronedarone (MULTAQ) 400 MG tablet Take 1 tablet (400 mg total) by mouth 2 (two) times daily with a meal. Please keep upcoming appt in September for future refills. Thank you 12/01/17   Sueanne Margarita, MD  UNABLE TO FIND Med Name: CPAP  [facility]    [provider]    Inpatient Medications: Scheduled Meds: . atorvastatin  80 mg Oral Q lunch  . colchicine  0.6 mg Oral Daily  . dronedarone  400 mg Oral BID WC  . ezetimibe  10 mg Oral Daily  . ipratropium-albuterol  3 mL Nebulization TID  . levothyroxine  25 mcg Oral QAC breakfast  . macitentan  10 mg Oral Daily  . potassium  chloride SA  10 mEq Oral QHS  . predniSONE  10 mg Oral BID WC  . Selexipag  1,600 mcg Oral BID   Continuous Infusions: . famotidine (PEPCID) IV 20 mg (12/01/17 0850)   PRN Meds: acetaminophen **OR** acetaminophen, bisacodyl, HYDROcodone-acetaminophen, ondansetron **OR** ondansetron (ZOFRAN) IV, senna-docusate  Allergies:    Allergies  Allergen Reactions  . Amiodarone     Dyspnea - felt to be 2/2 amio lung toxicity Has tolerated Omnipaque   . Zyban [Bupropion] Itching    Social History:   Social History   Socioeconomic History  . Marital status: Widowed    Spouse name: divorced.  . Number of children: 1  . Years of education: Not on file  . Highest education level: Not on file  Occupational History  . Occupation: retired. still works for [employer].   Social Needs  . Financial resource strain: Not on file  . Food insecurity:    Worry: Not on file    Inability: Not on file  . Transportation needs:    Medical: Not on file    Non-medical: Not on file  Tobacco Use  . Smoking status: Former Smoker    Packs/day: 0.50    Years: 44.00    Pack years: 22.00    Types: Cigarettes    Last attempt to quit: 04/07/2009    Years since quitting: 8.6  . Smokeless tobacco: Never Used  Substance and Sexual Activity  . Alcohol use: No  . Drug use: No  . Sexual activity: Never  Lifestyle  . Physical activity:    Days per week: Not on file    Minutes per session: Not on file  . Stress: Not on file  Relationships  . Social connections:    Talks on phone: Not on file    Gets together: Not on  file    Attends religious service: Not on file    Active member of club or organization: Not on file    Attends meetings of clubs or organizations: Not on file    Relationship status: Not on file  . Intimate partner violence:    Fear of current or ex partner: Not on file    Emotionally abused: Not on file    Physically abused: Not on file    Forced sexual activity: Not on file  Other Topics Concern  . Not on file  Social History Narrative   Pt lives alone with 2 dogs.     Family History:   Family History  Problem Relation Age of Onset  . Heart disease Sister        Good Pastures Syndrome  . Gout Father   . Lung cancer Father        smoked  . Breast cancer Paternal Aunt    Family Status:  Family Status  Relation Name Status  . Sister  Deceased  . Father  Deceased  . Mother  Deceased  . Ethlyn Daniels  (Not Specified)  . MGM  Deceased  . MGF  Deceased  . PGM  Deceased  . PGF  Deceased    ROS:  Please see the history of present illness.  All other ROS reviewed and negative.     Physical Exam/Data:   Vitals:   12/01/17 0415 12/01/17 0754 12/01/17 0843 12/01/17 1128  BP: (!) 103/46 (!) 97/52  (!) 86/44  Pulse: 65 74  76  Resp: _0 Temp:  98.3 F (36.8 C)  98.4 F (36.9 C)  TempSrc:  Oral  Oral  SpO2: 98% 100% 100% 97%  Weight:      Height:        Intake/Output Summary (Last 24 hours) at 12/01/2017 1413 Last data filed at 12/01/2017 0915 Gross per 24 hour  Intake 1080 ml  Output 800 ml  Net 280 ml   Filed Weights   11/30/17 0559 11/30/17 0925 12/01/17 0329  Weight: 84.8 kg 84.4 kg 85 kg   Body mass index is 33.21 kg/m.   General:  OBese 72 yo in NAD Skin: Warm, dry, intact  Head: Normocephalic, atraumatic, clear, moist mucus membranes. Neck: Negative for carotid bruits. No JVD Lungs:Clear to ausculation bilaterally. No wheezes, rales, or rhonchi. Breathing is unlabored. Cardiovascular: RRR with S1 S2. No murmurs, rubs, gallops, or LV heave  appreciated. Abdomen: Soft, non-tender, non-distended with normoactive bowel sounds. No obvious abdominal masses. MSK: Strength and tone appear normal for age. 5/5 in all extremities Extremities: Tr LE edema. No clubbing or cyanosis. DP/PT pulses 2+ bilaterally Neuro: Alert and oriented. No focal deficits. No facial asymmetry. MAE spontaneously. Psych: Responds to questions appropriately with normal affect.     EKG:  The EKG was personally reviewed and demonstrates: 11/29/2017 NSR LVH with diffuse T wave inversion throughout (consistent with apical HCM) Telemetry:  Telemetry was personally reviewed and demonstrates: 12/01/17 NSR HR   Relevant CV Studies:  ECHO: 09/08/2017: Study Conclusions  - Left ventricle: The cavity size was normal. Wall thickness was   increased in a pattern of moderate LVH. Thickening of the LV apex   consistent with apical variant HCM. Systolic function was   vigorous. The estimated ejection fraction was in the range of 65%   to 70%. Wall motion was normal; there were no regional wall   motion abnormalities. Doppler parameters are consistent with   restrictive left ventricular relaxation (grade 3 diastolic   dysfunction). The E/A ratio is >2. The E/e&' ratio is >20,   suggesting markedly elevated LV filling pressure. - Mitral valve: Calcified annulus. Mildly thickened leaflets .   There was mild regurgitation. - Left atrium: Severely dilated. - Atrial septum: Septum bows left to right, suggesting high LA   pressure. Negative for PFO or late shunting by saline microbubble   contrast. - Pulmonary arteries: PA peak pressure: 42 mm Hg (S). - Pericardium, extracardiac: A trivial pericardial effusion was   identified posterior to the heart. Features were not consistent   with tamponade physiology.  Impressions:  - Compared with a prior study in 06/2017, the LVEF is unchanged. The   degree of diastolic dysfunction is worse with a higher LV filling    pressure.  CATH: 06/10/2017: Right heart catheterization Mild to moderate pulmonary arterial hypertension, absolute numbers are lower than on RHC in 3/18.  PVR remains elevated.  2. Low cardiac output.  CO was low on 3/18 cath but today's numbers appear worse.  3. Normal filling pressures.   Is cardiac output low solely due to pulmonary hypertension? Surprisingly RA pressure is not elevated.   - Will get echo today.  - Continue titration of selexipag upwards. ?Consider IV Flolan.  - She is going to see pulmonary in a couple of weeks.   Laboratory Data:  Chemistry Recent Labs  Lab 11/29/17 0550 11/30/17 0413  NA 139 138  K 3.9 4.4  CL 112* 114*  CO2 16* 18*  GLUCOSE 69* 87  BUN 32* 29*  CREATININE 1.30* 1.19*  CALCIUM 8.1* 8.1*  GFRNONAA 40* 44*  GFRAA 46* 52*  ANIONGAP 11 6    Total Protein  Date Value Ref Range Status  11/30/2017 5.3 (L) 6.5 - 8.1 g/dL Final  09/12/2016 7.0 6.0 - 8.5 g/dL Final   Albumin  Date Value Ref Range Status  11/30/2017 2.9 (L) 3.5 - 5.0 g/dL Final  09/12/2016 4.0 3.5 - 4.8 g/dL Final   AST  Date Value Ref Range Status  11/30/2017 22 15 - 41 U/L Final   ALT  Date Value Ref Range Status  11/30/2017 40 0 - 44 U/L Final   Alkaline Phosphatase  Date Value Ref Range Status  11/30/2017 46 38 - 126 U/L Final   Total Bilirubin  Date Value Ref Range Status  11/30/2017 0.9 0.3 - 1.2 mg/dL Final   Bilirubin Total  Date Value Ref Range Status  09/12/2016 0.8 0.0 - 1.2 mg/dL Final   Hematology Recent Labs  Lab 11/30/17 1701 11/30/17 2343 12/01/17 0823  WBC 13.6* 11.6* 12.4*  RBC 3.05* 2.75* 2.87*  HGB 9.3* 8.3* 8.6*  HCT 28.0* 25.8* 27.1*  MCV 91.8 93.8 94.4  MCH 30.5 30.2 30.0  MCHC 33.2 32.2 31.7  RDW 16.1* 16.6* 16.6*  PLT 234 206 211   Cardiac Enzymes Recent Labs  Lab 11/29/17 0550 11/29/17 1209 11/29/17 1553  TROPONINI 0.11* 0.15* 0.16*   No results for input(s): TROPIPOC in the last 168 hours.  BNPNo results for  input(s): BNP, PROBNP in the last 168 hours.  DDimer No results for input(s): DDIMER in the last 168 hours. TSH:  Lab Results  Component Value Date   TSH 0.34 (L) 05/26/2016   Lipids: Lab Results  Component Value Date   CHOL 105 09/12/2016   HDL 41 09/12/2016   LDLCALC 49 09/12/2016   LDLDIRECT 115.0 06/09/2014   TRIG 76 09/12/2016   CHOLHDL 2.6 09/12/2016   HgbA1c: Lab Results  Component Value Date   HGBA1C 6.1 (H) 10/28/2014    Radiology/Studies:  Dg Chest 2 View  Result Date: 11/29/2017 CLINICAL DATA:  Shortness of breath.  Chest discomfort. EXAM: CHEST - 2 VIEW COMPARISON:  Radiograph 10/06/2017.  CT 06/08/2017 FINDINGS: Cardiomegaly is unchanged from prior exam. Unchanged mediastinal contours with aortic atherosclerosis. No pulmonary edema. Scarring in the left mid lung. No focal airspace disease or pleural effusion. No pneumothorax. Unchanged osseous structures. IMPRESSION: Chronic cardiomegaly and Aortic Atherosclerosis (ICD10-I70.0). No CHF or acute pulmonary process. Electronically Signed   By: Jeb Levering M.D.   On: 11/29/2017 06:22   Nm Gi Blood Loss  Result Date: 11/30/2017 CLINICAL DATA:  72 year old female with rectal bleeding. EXAM: NUCLEAR MEDICINE GASTROINTESTINAL BLEEDING SCAN TECHNIQUE: Sequential abdominal images were obtained following intravenous administration of Tc-63mlabeled red blood cells. RADIOPHARMACEUTICALS:  25.8 mCi Tc-948mertechnetate in-vitro labeled red cells. COMPARISON:  None. FINDINGS: No evidence of active gastrointestinal bleeding after 2 hours of imaging. Normal renal uptake and excretion into the bladder. IMPRESSION: Negative GI bleeding study. Electronically Signed   By: HeJacqulynn Cadet.D.   On: 11/30/2017 16:56   Assessment and Plan:   1.  Preoperative clearance for possible colonoscopy: Pt is a [age]o with history of atrial fib, apical HCM, pulmonary HTN due to possible rheumatologic abnormality, chronic O2 dependence   Given  all of these problems , particularly pulmonary, patient is at high but not prohibitive risk for sedation with endoscopy  Will need to be follow BP and Oxygen sats closely during procedure and after.   2   GI   Pt  continues to have bleeding    ? If related to meds   She has been on Xarelto and immunosuppressives      3  Cardiac   Pt with apical variant of HCM    R heart cath in March 2019 showed mild to mod Pulmonary HTN; Low caediac output.  RA normal   She has normal LV and RV systolic function by echo Elevated troponin most likely related to demand ischemia in setting of recent GI bleed  4.  Paroxysmal atrial fibrillation: Xarelto is currently on hold     Continue on Multaq and b blocker -CHA2DS2VASc =4 (at least )  5. Pulmonary HTN: -Followed by Dr. Aundra Dubin and Dr. Lake Bells -On Opsumit, Cellcept, Selexipag and prednisone>>currently undergoing rheumatology workup secondary to elevated [name] and concern for underlying autoimmune disease -Worsening dyspnea and O2 demand despite improvement per right heart cath>>>see above  Watch BP closely     6   HL  COntinue lipitor and Zetia  7  Jehovah's witness   Pt refuses blood products    For questions or updates, please contact [place] Please consult [email or web address] for contact info under Cardiology/STEMI.   Lyndel Safe NP-C [facility] Pager: [phone] 12/01/2017 2:13 PM  Pt seen and examined    I have amended note above by [place] Pt is a 72 yo with hx apical hypertrophic CM, pulmonary HTN,  Oxygen dependent COPD, possible rheumatologic disorder Presents with GI bleeding  On exam, pt is in NAD  On 4L [place] Lungs are rel clear     Cardiac exam RRR   No S3    No sigif murmurs Abd is soft   Mild diffus tenderness   Ext with no edema    Pt is at increased, but not prohibitive risk for EGD/colonoscopy.   Keep close attention to VS during procedure   Need to clarify source of bleeding   Hold anticoagulation for now. Continue  multaq Will review with Drs Aundra Dubin and Lake Bells.  Dorris Carnes MD

## 2017-12-01 NOTE — Progress Notes (Signed)
Pt has had 1 moderate sized bloody bowel movement with clots noted overnight.  Pt c/o of DOE when she gets OOB, she states this is normal for her. Pt BP 103/46 this am.  Pt did receive 500cc bolus over 2 hours as ordered.  AM CBC pending. Will cont to monitor closely.

## 2017-12-01 NOTE — Progress Notes (Signed)
Patient stated that she place herself on CPAP when she is ready for bed. RT informed patient to have RT called if she needs assistance. RT will monitor as needed.

## 2017-12-01 NOTE — Progress Notes (Addendum)
[facility] Progress Note  Amy [name] 72 y.o. [birth year]  CC:  Rectal bleeding   Subjective:she continues to have rectal bleeding. Bleeding scan negative yesterday. Hemoglobin dropped to 8.3 from normal but has been stable since yesterday. Denies any nausea or vomiting.  ROS : baseline  shortness of breath. Negative for any acute chest pain.   Objective: Vital signs in last 24 hours: Vitals:   12/01/17 0843 12/01/17 1128  BP:  (!) 86/44  Pulse:  76  Resp:  17  Temp:  98.4 F (36.9 C)  SpO2: 100% 97%    Physical Exam:  Gen. Alert/03. Not in acute distress Heart : RRR  Abdomen. Soft, nontender, nondistended, bowel sounds present. Psych : Mood and affect normal   Lab Results: Recent Labs    11/29/17 0550 11/30/17 0413  NA 139 138  K 3.9 4.4  CL 112* 114*  CO2 16* 18*  GLUCOSE 69* 87  BUN 32* 29*  CREATININE 1.30* 1.19*  CALCIUM 8.1* 8.1*   Recent Labs    11/29/17 0550 11/30/17 0413  AST 26 22  ALT 42 40  ALKPHOS 48 46  BILITOT 0.7 0.9  PROT 5.6* 5.3*  ALBUMIN 3.1* 2.9*   Recent Labs    11/29/17 0550  11/30/17 2343 12/01/17 0823  WBC 10.3   < > 11.6* 12.4*  NEUTROABS 7.6  --   --   --   HGB 12.7   < > 8.3* 8.6*  HCT 41.0   < > 25.8* 27.1*  MCV 96.5   < > 93.8 94.4  PLT 273   < > 206 211   < > = values in this interval not displayed.   Recent Labs    11/29/17 0550  LABPROT 23.3*  INR 2.09      Assessment/Plan: - painless rectal bleeding while on Xarelto..most likely hemorrhoidal vs  diverticular in nature. - acute blood loss anemia. Mild drop in hemoglobin noted - Bloody diarrhea.- C. Difficile and GI pathogen panel negative. -  atrial fibrillation. Last dose of Xarelto  08/24  - History of pulmonary hypertension. - History of atypical cardiomyopathy. 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 Negative virtual colonoscopy last year. - mildly elevated troponins. Patient denies any new chest pain. - [id number]  Witness  Recommendations ------------------------- - flexible sigmoidoscopy yesterday showed old and clotted blood but no evidence of active bleeding. It showed diverticulosis. Patient thinks her increase risk of bleeding is secondary to CellCept in combination with Xarelto..CellCept does have increase risk of gastrointestinal hemorrhage with the incident rate of 3 to less than 20% according to Uptod[employer]. Patient is refusing to take CellCept. - she is Jehovah's Witness. If there is ongoing evidence of bleeding or ongoing drop in hemoglobin, she may need colonoscopy for further evaluation but she is at high risk given multiple cardiac and pulmonary comorbidities. - Recommend cardiology evaluation for risk stratification.May consider repeating unsedated flexible sigmoidoscopy if she is not a candidate for colonoscopy from cardiac standpoint. - recheck hemoglobin this evening. If hemoglobin remains stable,  recommend conservative management. - GI will follow   Otis Brace MD, [place] 12/01/2017, 12:06 PM  Contact #  [phone]

## 2017-12-02 DIAGNOSIS — I422 Other hypertrophic cardiomyopathy: Secondary | ICD-10-CM

## 2017-12-02 DIAGNOSIS — I5032 Chronic diastolic (congestive) heart failure: Secondary | ICD-10-CM

## 2017-12-02 DIAGNOSIS — I480 Paroxysmal atrial fibrillation: Secondary | ICD-10-CM

## 2017-12-02 LAB — BASIC METABOLIC PANEL
ANION GAP: 4 — AB (ref 5–15)
BUN: 19 mg/dL (ref 8–23)
CO2: 19 mmol/L — ABNORMAL LOW (ref 22–32)
Calcium: 8 mg/dL — ABNORMAL LOW (ref 8.9–10.3)
Chloride: 113 mmol/L — ABNORMAL HIGH (ref 98–111)
Creatinine, Ser: 1.04 mg/dL — ABNORMAL HIGH (ref 0.44–1.00)
GFR calc Af Amer: >60 mL/min (ref >60)
GFR, EST NON AFRICAN AMERICAN: 52 mL/min — AB (ref >60)
GLUCOSE: 103 mg/dL — AB (ref 70–99)
POTASSIUM: 4.1 mmol/L (ref 3.5–5.1)
Sodium: 136 mmol/L (ref 135–145)

## 2017-12-02 LAB — CBC
HCT: 23.7 % — ABNORMAL LOW (ref 36.0–46.0)
HCT: 26.9 % — ABNORMAL LOW (ref 36.0–46.0)
HEMOGLOBIN: 7.7 g/dL — AB (ref 12.0–15.0)
Hemoglobin: 8.6 g/dL — ABNORMAL LOW (ref 12.0–15.0)
MCH: 30.3 pg (ref 26.0–34.0)
MCH: 30.3 pg (ref 26.0–34.0)
MCHC: 32.5 g/dL (ref 30.0–36.0)
MCHC: 32 g/dL (ref 30.0–36.0)
MCV: 93.3 fL (ref 78.0–100.0)
MCV: 94.7 fL (ref 78.0–100.0)
PLATELETS: 197 10*3/uL (ref 150–400)
Platelets: 198 10*3/uL (ref 150–400)
RBC: 2.84 MIL/uL — AB (ref 3.87–5.11)
RBC: 2.54 MIL/uL — AB (ref 3.87–5.11)
RDW: 16.4 % — ABNORMAL HIGH (ref 11.5–15.5)
RDW: 16.7 % — ABNORMAL HIGH (ref 11.5–15.5)
WBC: 13.8 10*3/uL — AB (ref 4.0–10.5)
WBC: 11.8 10*3/uL — AB (ref 4.0–10.5)

## 2017-12-02 MED ORDER — PEG 3350-KCL-NA BICARB-NACL 420 G PO SOLR
4000.0000 mL | Freq: Once | ORAL | Status: AC
  Administered 2017-12-02: 4000 mL via ORAL
  Filled 2017-12-02: qty 4000

## 2017-12-02 MED ORDER — SODIUM CHLORIDE 0.9 % IV SOLN: INTRAVENOUS | Status: DC

## 2017-12-02 MED ORDER — SODIUM CHLORIDE 0.9 % IV SOLN
INTRAVENOUS | Status: DC
  Administered 2017-12-02: 10:00:00 via INTRAVENOUS

## 2017-12-02 MED ORDER — SODIUM CHLORIDE 0.9 % IV BOLUS
250.0000 mL | Freq: Once | INTRAVENOUS | Status: AC
  Administered 2017-12-02: 250 mL via INTRAVENOUS

## 2017-12-02 MED ORDER — VITAMIN B-12 100 MCG PO TABS
100.0000 ug | ORAL_TABLET | Freq: Every day | ORAL | Status: DC
  Administered 2017-12-02 – 2017-12-08 (×7): 100 ug via ORAL
  Filled 2017-12-02 (×7): qty 1

## 2017-12-02 MED ORDER — FOLIC ACID 1 MG PO TABS
1.0000 mg | ORAL_TABLET | Freq: Every day | ORAL | Status: DC
  Administered 2017-12-02 – 2017-12-08 (×7): 1 mg via ORAL
  Filled 2017-12-02 (×7): qty 1

## 2017-12-02 MED ORDER — SODIUM CHLORIDE 0.9 % IV SOLN
510.0000 mg | Freq: Once | INTRAVENOUS | Status: AC
  Administered 2017-12-02: 510 mg via INTRAVENOUS
  Filled 2017-12-02: qty 17

## 2017-12-02 NOTE — Progress Notes (Signed)
Pt c/o of numbness feeling to bilat fingers and toes. Can feel sensation when touched. No focal deficits noted. Notified Dr. Tyrell Antonio. Cont to monitor. Carroll Kinds RN

## 2017-12-02 NOTE — H&P (View-Only) (Signed)
[facility] Progress Note  Amy [name] 72 y.o. Dec 15, 1945  CC:  Rectal bleeding   Subjective:she continues to have rectal bleeding. Drop in hemoglobin noted. Denied abdominal pain, nausea vomiting.  ROS : Baseline shortness of breath. Negative for any acute chest pain.   Objective: Vital signs in last 24 hours: Vitals:   12/02/17 0736 12/02/17 0820  BP: 112/60   Pulse: 63   Resp: 18   Temp: 98.1 F (36.7 C)   SpO2: 97% 98%    Physical Exam:  Gen. Alert/03. Not in acute distress Heart : RRR  Abdomen. Soft, nontender, nondistended, bowel sounds present. Psych : Mood and affect normal   Lab Results: Recent Labs    11/30/17 0413 12/02/17 0444  NA 138 136  K 4.4 4.1  CL 114* 113*  CO2 18* 19*  GLUCOSE 87 103*  BUN 29* 19  CREATININE 1.19* 1.04*  CALCIUM 8.1* 8.0*   Recent Labs    11/30/17 0413  AST 22  ALT 40  ALKPHOS 46  BILITOT 0.9  PROT 5.3*  ALBUMIN 2.9*   Recent Labs    12/01/17 2356 12/02/17 0444  WBC 13.8* 11.8*  HGB 8.6* 7.7*  HCT 26.9* 23.7*  MCV 94.7 93.3  PLT 198 197   No results for input(s): LABPROT, INR in the last 72 hours.    Assessment/Plan: - painless rectal bleeding while on Xarelto..most likely hemorrhoidal vs  diverticular in nature. - acute blood loss anemia. Mild drop in hemoglobin noted - Bloody diarrhea.- C. Difficile and GI pathogen panel negative. -  atrial fibrillation. Last dose of Xarelto  08/24  - History of pulmonary hypertension. - History of atypical cardiomyopathy. 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 Negative virtual colonoscopy last year. - mildly elevated troponins. Patient denies any new chest pain. [employer] Witness  Recommendations ------------------------- - appreciate cardiology  input. Patient is at high-risk but not prohibitive risk for colonoscopy.because of ongoing bleeding, drop in hemoglobin and given the fact that patient is Jehovah's Witness, we will proceed  with colonoscopy tomorrow. - monitor H&H. Consider IV iron infusion.  Risks (bleeding, infection, bowel perforation that could require surgery, sedation-related changes in cardiopulmonary systems), benefits (identification and possible treatment of source of symptoms, exclusion of certain causes of symptoms), and alternatives (watchful waiting, radiographic imaging studies, empiric medical treatment)  were explained to patient in detail and patient wishes to proceed.  - flexible sigmoidoscopy 08/26  showed old and clotted blood but no evidence of active bleeding. It showed diverticulosis Bleeding scan negative.. Patient thinks her increase risk of bleeding is secondary to CellCept in combination with Xarelto..CellCept does have increase risk of gastrointestinal hemorrhage with the incident rate of 3 to less than 20% according to [employer]ptod[employer]. Patient is refusing to take CellCept.   Otis Brace MD, [place] 12/02/2017, 8:52 AM  Contact #  [phone]

## 2017-12-02 NOTE — Telephone Encounter (Signed)
Sorry to hear this, thanks for letting me know

## 2017-12-02 NOTE — Consult Note (Addendum)
Advanced Heart Failure Team Consult Note   Primary Physician: Aretta Nip, MD PCP-Cardiologist:  Dr Aundra Dubin.  Pulmonary: Dr Lake Bells Rheumatology: Dr Trudie Reed  Reason for Consultation: Pulmonary HTN   HPI:    Amy [name] is seen today for evaluation of pulmonary HTN  at the request of Dr Radford Pax.   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  Started on cellcept by [facility] the end of May. She had slow up titration and was on 2000 mg twice a day and had been on the higher dose for ~1 week. On Sunday she started having BRBPR with large clots. She no longer wants to take cellcept. Last dose on cellcept was 8/25 but has since refused.   Admitted with chest pain and BRBPR. Hemoglobin on admit was 12.7 and has continued to drop. Troponin on admit 0.11>0.15, 0.16.  Xarelto was held. GI consulted. She underwent flexible sigmoidoscopy with old and clotted blood and diverticulosis. Tagged RBC scan was negative. Cardiology consulted for preoperative clearance for colonoscopy. Procedure was not prohibitive so colonoscopy has been set up for tomorrow.   She continues to have BRBPR daily. She does not wish to receive blood.   Complaining of fatigue. Mild dyspnea with exertion. Denies chest pain.   PAH Treatement Oxygen, Opsumit, & Selexipag. Intolerant Adicrca  RHC 06/2017  RA mean 6 RV 52/5 PA 49/16, mean 29 PCWP mean 10 Oxygen saturations: PA 59% AO 95% Cardiac Output (Fick) 3.6  Cardiac Index (Fick) 1.9 PVR 5.3 WU Cardiac Output (Thermo) 2.6 Cardiac Index (Thermo) 1.4  PVR 7.3 WU  Echo 09/2017  Left ventricle: The cavity size was normal. Wall thickness was   increased in a pattern of moderate LVH. Thickening of the LV  apex   consistent with apical variant HCM. Systolic function was   vigorous. The estimated ejection fraction was in the range of 65%   to 70%. Wall motion was normal; there were no regional wall   motion abnormalities. Doppler parameters are consistent with   restrictive left ventricular relaxation (grade 3 diastolic   dysfunction). The E/A ratio is >2. The E/e&' ratio is >20,   suggesting markedly elevated LV filling pressure. - Mitral valve: Calcified annulus. Mildly thickened leaflets .   There was mild regurgitation. - Left atrium: Severely dilated. - Atrial septum: Septum bows left to right, suggesting high LA   pressure. Negative for PFO or late shunting by saline microbubble   contrast. - Pulmonary arteries: PA peak pressure: 42 mm Hg (S). - Pericardium, extracardiac: A trivial pericardial effusion was   identified posterior to the heart. Features were not consistent   with tamponade physiology.  ECHO 09/2017 with negative bubble study.     Review of Systems: [y] = yes, _0  = no   General: Weight gain _1 ; Weight loss _2 ; Anorexia _3 ; Fatigue [Y ]; Fever _4 ; Chills _5 ; Weakness [Y ]  Cardiac: Chest pain/pressure _6 ; Resting SOB _7 ; Exertional SOB _8 ; Orthopnea _9 ; Pedal Edema _10 ; Palpitations _11 ; Syncope _12 ; Presyncope _13 ; Paroxysmal nocturnal dyspnea_14   Pulmonary: Cough _15 ; Wheezing_16 ; Hemoptysis_17 ; Sputum _18 ; Snoring _19   GI: Vomiting_20 ; Dysphagia_21 ; Melena_22 ; Hematochezia _23 ;  Heartburn_0 ; Abdominal pain _1 ; Constipation _2 ; Diarrhea _3 ; BRBPR [Y ]  GU: Hematuria_4 ; Dysuria _5 ; Nocturia_6   Vascular: Pain in legs with walking _7 ; Pain in feet with lying flat _8 ; Non-healing sores _9 ; Stroke _10 ; TIA _11 ; Slurred speech _12 ;  Neuro: Headaches_13 ; Vertigo_14 ; Seizures_15 ; Paresthesias_16 ;Blurred vision _17 ; Diplopia _18 ; Vision changes _19   Ortho/Skin: Arthritis _20 ; Joint pain [ Y]; Muscle pain _21 ; Joint swelling _22 ; Back Pain [ Y]; Rash _23     Psych: Depression_24 ; Anxiety_25   Heme: Bleeding problems _26 ; Clotting disorders _27 ; Anemia _28   Endocrine: Diabetes _29 ; Thyroid dysfunction_30   Home Medications Prior to Admission medications   Medication Sig Start Date End Date Taking? Authorizing Provider  acetaminophen (TYLENOL) 325 MG tablet Take 2 tablets (650 mg total) by mouth every 4 (four) hours as needed for headache or mild pain. 10/02/16  Yes Kilroy, Luke K, PA-C  atorvastatin (LIPITOR) 80 MG tablet Take 1 tablet (80 mg total) by mouth daily with lunch. 02/08/15  Yes Sherran Needs, NP  Cholecalciferol (VITAMIN D) 2000 UNITS CAPS Take 2,000 Units by mouth daily.    Yes [provider]  colchicine 0.6 MG tablet Take 0.6 mg by mouth daily.    Yes [provider]  ezetimibe (ZETIA) 10 MG tablet Take 1 tablet (10 mg total) by mouth daily. Please make overdue appt with Dr. Radford Pax before anymore refills. 2nd attempt 11/17/17  Yes Turner, Eber Hong, MD  furosemide (LASIX) 20 MG tablet Take 1 tablet (20 mg total) by mouth daily. 08/13/16  Yes Larey Dresser, MD  ipratropium-albuterol (DUONEB) 0.5-2.5 (3) MG/3ML SOLN Take 3 mLs by nebulization 3 (three) times daily. DX: J44.9 07/03/17  Yes Juanito Doom, MD  levothyroxine (SYNTHROID, LEVOTHROID) 50 MCG tablet Take 25 mcg by mouth daily before breakfast.    Yes [provider]  Macitentan (OPSUMIT) 10 MG TABS Take 1 tablet (10 mg total) by mouth daily. 06/29/17  Yes Larey Dresser, MD  metoprolol tartrate (LOPRESSOR) 25 MG tablet Take 25 mg by mouth as needed. If having to take often for breakthrough, may take 12.5 twice a day.  Please contact office if change is made   Yes [provider]  Multiple Vitamin (MULTIVITAMIN) capsule Take 1 capsule by mouth daily.   Yes [provider]  mycophenolate (CELLCEPT) 500 MG tablet Take 1,000 mg by mouth 2 (two) times daily.    Yes Juanito Doom, MD  pantoprazole (PROTONIX) 40 MG tablet Take 1  tablet (40 mg total) by mouth daily. 08/05/17  Yes Larey Dresser, MD  potassium chloride SA (K-DUR,KLOR-CON) 20 MEQ tablet Take 0.5 tablets (10 mEq total) by mouth at bedtime. 10/02/16  Yes Kilroy, Luke K, PA-C  predniSONE (DELTASONE) 10 MG tablet Take 2 tablets (20 mg total) by mouth daily with breakfast. Patient taking differently: Take 10 mg by mouth 2 (two) times daily with a meal.  10/28/17  Yes McQuaid, Ronie Spies, MD  Selexipag (UPTRAVI) 800 MCG TABS Take 1,600 mcg by mouth 2 (two) times daily.    Yes [provider]  traMADol (ULTRAM) 50 MG tablet Take 50 mg by mouth every 8 (eight) hours as needed for moderate pain.   Yes [provider]  XARELTO 15 MG TABS tablet TAKE 1 TABLET BY MOUTH EVERY DAY WITH SUPPER Patient taking differently:  Take 15 mg by mouth daily with supper.  09/02/17  Yes Larey Dresser, MD  cyclobenzaprine (FLEXERIL) 5 MG tablet Take 1 tablet (5 mg total) by mouth 3 (three) times daily as needed for muscle spasms. Patient not taking: Reported on 11/29/2017 06/26/16   Geradine Girt, DO  dronedarone (MULTAQ) 400 MG tablet Take 1 tablet (400 mg total) by mouth 2 (two) times daily with a meal. Please keep upcoming appt in September for future refills. Thank you 12/01/17   Sueanne Margarita, MD  UNABLE TO FIND Med Name: CPAP  [facility]    [provider]    Past Medical History: Past Medical History:  Diagnosis Date  . Apical variant hypertrophic cardiomyopathy ([place])    Diagnosed by echo and ECG 11/13  . Atypical atrial flutter ([place])   . CAD (coronary artery disease)    a. LHC in 10/2011 demonstrated non-obstructive disease and an 80% lesion in a small D1 treated medically.   . CHF (congestive heart failure) ([place])   . Chronic diastolic heart failure ([place])   . CKD (chronic kidney disease), stage III ([place])   . COPD (chronic obstructive pulmonary disease) ([place])   . Dizziness    had PT for being off balance - in approx. 2012  . GERD (gastroesophageal  reflux disease)   . Hyperlipidemia   . Hypertension   . Hypothyroid   . Lumbar disc disease   . Macular degeneration   . OSA (obstructive sleep apnea)   . Persistent atrial fibrillation ([place])       . Pulmonary hypertension ([place])    a. [place] 05/2013 showed mild PAH with normal PCWP and RA pressure, could be related to OSA and low oxygen saturation.   . Refusal of blood transfusions as patient is Jehovah's Witness   . Sinus bradycardia     Past Surgical History: Past Surgical History:  Procedure Laterality Date  . CARDIAC CATHETERIZATION     normal coronary arteries  . CARDIAC CATHETERIZATION N/A 10/31/2014   Procedure: Left Heart Cath and Coronary Angiography;  Surgeon: Leonie Man, MD;  Location: [facility];  Service: Cardiovascular;  Laterality: N/A;  . CATARACT EXTRACTION Bilateral   . CESAREAN SECTION  1983  . ELECTROPHYSIOLOGIC STUDY N/A 04/24/2015   Procedure: Atrial Fibrillation Ablation;  Surgeon: [name], MD;  Location: [place];  Service: Cardiovascular;  Laterality: N/A;  . FLEXIBLE SIGMOIDOSCOPY N/A 11/30/2017   Procedure: FLEXIBLE SIGMOIDOSCOPY;  Surgeon: Otis Brace, MD;  Location: [facility];  Service: Gastroenterology;  Laterality: N/A;  . RIGHT HEART CATH N/A 06/25/2016   Procedure: Right Heart Cath;  Surgeon: Sherren Mocha, MD;  Location: [facility];  Service: Cardiovascular;  Laterality: N/A;  . RIGHT HEART CATH N/A 06/10/2017   Procedure: RIGHT HEART CATH;  Surgeon: Larey Dresser, MD;  Location: [facility];  Service: Cardiovascular;  Laterality: N/A;  . RIGHT HEART CATHETERIZATION N/A 06/01/2013   Procedure: RIGHT HEART CATH;  Surgeon: Larey Dresser, MD;  Location: [facility] LAB;  Service: Cardiovascular;  Laterality: N/A;  . TEE WITHOUT CARDIOVERSION N/A 04/23/2015   Procedure: TRANSESOPHAGEAL ECHOCARDIOGRAM (TEE);  Surgeon: Josue Hector, MD;  Location: [facility] ENDOSCOPY;  Service: Cardiovascular;  Laterality: N/A;     Family History: Family History  Problem Relation Age of Onset  . Heart disease Sister        Good Pastures Syndrome  . Gout Father   . Lung cancer Father  smoked  . Breast cancer Paternal Aunt     Social History: Social History   Socioeconomic History  . Marital status: Widowed    Spouse name: divorced.  . Number of children: 1  . Years of education: Not on file  . Highest education level: Not on file  Occupational History  . Occupation: retired. still works for [employer].   Social Needs  . Financial resource strain: Not on file  . Food insecurity:    Worry: Not on file    Inability: Not on file  . Transportation needs:    Medical: Not on file    Non-medical: Not on file  Tobacco Use  . Smoking status: Former Smoker    Packs/day: 0.50    Years: 44.00    Pack years: 22.00    Types: Cigarettes    Last attempt to quit: 04/07/2009    Years since quitting: 8.6  . Smokeless tobacco: Never Used  Substance and Sexual Activity  . Alcohol use: No  . Drug use: No  . Sexual activity: Never  Lifestyle  . Physical activity:    Days per week: Not on file    Minutes per session: Not on file  . Stress: Not on file  Relationships  . Social connections:    Talks on phone: Not on file    Gets together: Not on file    Attends religious service: Not on file    Active member of club or organization: Not on file    Attends meetings of clubs or organizations: Not on file    Relationship status: Not on file  Other Topics Concern  . Not on file  Social History Narrative   Pt lives alone with 2 dogs.     Allergies:  Allergies  Allergen Reactions  . Amiodarone     Dyspnea - felt to be 2/2 amio lung toxicity Has tolerated Omnipaque   . Zyban [Bupropion] Itching    Objective:    Vital Signs:   Temp:  [97.8 F (36.6 C)-98.4 F (36.9 C)] 98.1 F (36.7 C) (08/28 0736) Pulse Rate:  [63-86] 63 (08/28 0736) Resp:  [12-24] 18 (08/28 0736) BP: (86-118)/(44-66) 112/60  (08/28 0736) SpO2:  [92 %-100 %] 98 % (08/28 0820) Weight:  [85.4 kg] 85.4 kg (08/28 0642) Last BM Date: 12/01/17  Weight change: Filed Weights   11/30/17 0925 12/01/17 0329 12/02/17 0642  Weight: 84.4 kg 85 kg 85.4 kg    Intake/Output:   Intake/Output Summary (Last 24 hours) at 12/02/2017 0935 Last data filed at 12/02/2017 0600 Gross per 24 hour  Intake 1180 ml  Output 700 ml  Net 480 ml      Physical Exam    General:  Well appearing. No resp difficulty HEENT: normal Neck: supple. JVP 6-7  . Carotids 2+ bilat; no bruits. No lymphadenopathy or thyromegaly appreciated. Cor: PMI nondisplaced. Irregular Regular rate & rhythm. No rubs, gallops or murmurs. Lungs: clear 3 liters  Abdomen: soft, nontender, nondistended. No hepatosplenomegaly. No bruits or masses. Good bowel sounds. Extremities: no cyanosis, clubbing, rash, edema Neuro: alert & orientedx3, cranial nerves grossly intact. moves all 4 extremities w/o difficulty. Affect pleasant   Telemetry   ? A fib. Check EKG now.   EKG    8/25 NSR 61 bpm   Labs   Basic Metabolic Panel: Recent Labs  Lab 11/29/17 0550 11/30/17 0413 12/02/17 0444  NA 139 138 136  K 3.9 4.4 4.1  CL 112* 114* 113*  CO2 16*  18* 19*  GLUCOSE 69* 87 103*  BUN 32* 29* 19  CREATININE 1.30* 1.19* 1.04*  CALCIUM 8.1* 8.1* 8.0*    Liver Function Tests: Recent Labs  Lab 11/29/17 0550 11/30/17 0413  AST 26 22  ALT 42 40  ALKPHOS 48 46  BILITOT 0.7 0.9  PROT 5.6* 5.3*  ALBUMIN 3.1* 2.9*   Recent Labs  Lab 11/29/17 0550  LIPASE 46   No results for input(s): AMMONIA in the last 168 hours.  CBC: Recent Labs  Lab 11/29/17 0550  11/30/17 2343 12/01/17 0823 12/01/17 1520 12/01/17 2356 12/02/17 0444  WBC 10.3   < > 11.6* 12.4* 13.7* 13.8* 11.8*  NEUTROABS 7.6  --   --   --   --   --   --   HGB 12.7   < > 8.3* 8.6* 8.6* 8.6* 7.7*  HCT 41.0   < > 25.8* 27.1* 27.1* 26.9* 23.7*  MCV 96.5   < > 93.8 94.4 94.1 94.7 93.3  PLT 273    < > 206 211 210 198 197   < > = values in this interval not displayed.    Cardiac Enzymes: Recent Labs  Lab 11/29/17 0550 11/29/17 1209 11/29/17 1553  TROPONINI 0.11* 0.15* 0.16*    BNP: BNP (last 3 results) Recent Labs    12/11/16 1356 06/02/17 1013 10/06/17 0925  BNP 148.9* 126.0* 675.7*    ProBNP (last 3 results) No results for input(s): PROBNP in the last 8760 hours.   CBG: Recent Labs  Lab 11/30/17 1129  GLUCAP 78    Coagulation Studies: No results for input(s): LABPROT, INR in the last 72 hours.   Imaging    No results found.   Medications:     Current Medications: . atorvastatin  80 mg Oral Q lunch  . colchicine  0.6 mg Oral Daily  . dronedarone  400 mg Oral BID WC  . ezetimibe  10 mg Oral Daily  . ipratropium-albuterol  3 mL Nebulization TID  . levothyroxine  25 mcg Oral QAC breakfast  . macitentan  10 mg Oral Daily  . polyethylene glycol-electrolytes  4,000 mL Oral Once  . predniSONE  10 mg Oral BID WC  . Selexipag  1,600 mcg Oral BID     Infusions: . sodium chloride    . famotidine (PEPCID) IV 20 mg (12/02/17 0849)  . 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  Admitted with GI bleed.   Assessment/Plan   1.  GI bleed  Jehovahs witness. No blood products.  Xarelto stopped on admit.  GI consulted. Flex Sigmoidoscopy with old blood and diverticulosis.  Tagged RBC negative.  Hgb continues to drift down from 12.7-->7.7.  Plan for colonoscopy tomorrow.   2. 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 Followed by Dr Lake Bells and Dr Aundra Dubin. Refuses cellcept given 3% risk of gastric hemorrhage. VQ scan was negative 2018.  Intolerant  adcirca On opsumit and selexipag  3. PAF Looks like she is back in A fib. Check EKG. Rate controlled.  - on multaq 400 mg daily.  - at home she takes metoprolol as needed.  - Had A fib ablation 2017  - No  amio with history of suspected amio toxicity  Off anticoagulants with GI bleed.  Add ted hose and SCDs.   4. Chronic Diastolic HF Most recent ECHO 09/2017 EF 65-70%. Grade III DD Peak PA pressure 42 mm hg  She does not appear volume overloaded. Continue to hold lasix.   5. Chronic Hypoxic Respiratory Failure Continue 3 liter s oxygen. O2 saturations stable.     Medication concerns reviewed with patient and pharmacy team. Barriers identified: none. Receives PAH meds from specialty pharmacy.   Length of Stay: 2  Darrick Grinder, NP  12/02/2017, 9:35 AM  Advanced Heart Failure Team Pager [phone] (M-F; [place])  Please contact [facility] for night-coverage after hours (4p -7a ) and weekends on [email or web address]  Patient seen with NP, agree with the above note.  She was admitted with hematochezia and anemia.  Bleeding seems to be slowing.  Hgb still trending down, 7.7 today.  No transfusions as Jehovah's Witness.  Breathing ok.  Plan for colonoscopy tomorrow.  Telemetry shows NSR.   On exam, 1+ ankle edema.  Regular S1S2.  No JVD.   1. GI bleeding: Suspect lower GI bleed with hematochezia.  Xarelto held. There is some concern that it could have been caused by Cellcept as there is some risk of GI bleeding with Cellcept and occurred after increase in dose.   - No blood products, Jehovah's Witness.  - Plan for colonoscopy tomorrow.  - Stopping Cellcept for now.  - Will eventually need to restart Xarelto, will decide based on GI evaluation.  2. Chronic diastolic CHF: Echo 6/01 with EF 65-70%, normal RV size and systolic function.  She is not volume overloaded by exam, looks euvolemic.  BP stable now.  - Hold Lasix but can stop IV fluid.   3. Apical hypertrophic cardiomyopathy: Cardiac MRI in  7/16 showed mid-apical LV hypertrophy with vigorous contraction. Now off metoprolol with slow HR (uses metoprolol prn tachycardia - atrial fibrillation).  ECG is consistent with apical HCM.   4. Atrial fibrillation:  Remains in NSR on Multaq.  Per Dr Rayann Heman, would not re-do her ablation. Breakthrough atrial fibrillation in 7/19, DCCV back to NSR in ER. NSR here.  - Xarelto held with LGIB.   - Continue dronedarone. Not ideal med with history of diastolic CHF but she has not had problems with volume overload recently.  5. Pulmonary hypertension: RHC in 3/18 showed moderate pulmonary arterial hypertension with elevated PVR (7.3 WU by Fick, 9.6 WU by thermo). She has COPD but I do not think this plays a large role in the pulmonary hypertension =>  PFTs in 3/18 showed minimal obstruction and restriction, there was significantly decreased DLCO, this may be due to pulmonary vascular disease.  OSA may play a role but not a large one. V/Q scan did not suggest chronic PEs in 3/18.  RF borderline elevated (probably not significant), HIV negative, Anti-SCL70 negative, ANA negative, P-ANCA weakly positive.  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   I have been concerned that she could have pulmonary veno-occlusive disease or pulmonary capillary hemangiomatosis based on the response to [facility] treatment thus far.  Her RHC in 3/19 showed lower PA pressure and PVR with normal right and left heart filling pressures, but cardiac output was lower than prior.  Surprisingly, her RV looked normal on echo so I question how accurate the cardiac output measurement really was.  Bubble study negative on echo.  I did a  high resolution CT to look for evidence of ILD, but it seems to have been degraded by respiratory artifact.  No severe abnormality was detected on the study.  She has been seen by pulmonary and rheumatology: no definitive evidence for PVOD, concern for autoimmune form of  pulmonary hypertension with associated pneumonitis. She was started on Cellcept. - Cellcept now stopped due to concern it contributed to GI bleed.   - She has a history of afib ablation so pulmonary vein stenosis certainly remains a concern => I think she should get a pulmonary vein protocol CTA eventually, should be able to do at some point soon with improvement in creatinine.  - Continue Opsumit and selexipag.     - She did not tolerate Adcirca.    [name] 12/02/2017 2:11 PM

## 2017-12-02 NOTE — Progress Notes (Signed)
Pt having to prep for procedure in the am and unable to tolerate CPAP tonight.  Machine at bedside for patient use.  Pt encouraged to call if she decides to wear CPAP later in the night.

## 2017-12-02 NOTE — Progress Notes (Signed)
PROGRESS NOTE    CALLAHAN PEDDIE  GUY:[id number] DOB: 1945-09-20 DOA: 11/29/2017 PCP: Aretta Nip, MD    Brief Narrative: Amy Core Whren-Nobleis a 72 y.o.femalewith medical history significant forparoxysmal atrial fibrillation on Xarelto, hypertension, hyperlipidemia, hypothyroidism diastolic heart failure, CAD, with catheterization in October 07, 2017, nonobstructive CAD medical management, OSA on CPAP, as well as apical H COM by cardiac MRI, with mild to moderate pulmonary hypertension on chronic O2 4L [place], on CellCept, OPSUMIT Uptravi, followed by cardiology and pulmonology,presenting to the emergency department with bright red blood per rectum.  Gastroenterology was consulted.  Assessment & Plan:   Active Problems:   Asthmatic bronchitis , chronic (HCC)   Hypothyroidism   Essential hypertension   Apical variant hypertrophic cardiomyopathy (HCC)   Chronic diastolic heart failure (HCC)   OSA (obstructive sleep apnea)   CAD (coronary artery disease)   PAF (paroxysmal atrial fibrillation) (HCC)   Chronic respiratory failure with hypoxia (HCC)   GIB (gastrointestinal bleeding)   1-Acute lower GI bleed;  Transfer to step down 8-26 due to hypotension and persistent GI bleed.  GI consulted and following, S/P Flex sig on 8-26 unrevealing.  Nuclear scan did not showed source of bleeding.  Evaluated by cardiology high risk for procedure, but not prohibited.  IV iron ordered.  Had bloody BM today. Two days ago she had 6, yesterday only one.  Limit blood drawn, start B 12 and folic acid.   Chronic diastolic CHF / Pulmonary hypertension / Chronic hypoxic respiratory failure On chronic 4 L oxygen.  Holding lasix due to GI bleed.   CAD; cath July 2019, non obstructive CAD  H COM by cardiac MRI, with mild to moderate pulmonary hypertension, P-ANCA+, CCP+, RO-52+ Holding Cellcept.    PAF hold xarelto.   CKD 3 Stable.   HLD;  On statins.   Hypothyroidism;  Continue  with synthroid.   Transient numbness finger;  Monitor, rest neuro exam unremarkable.   DVT prophylaxis: scd Code Status:  Full code.  Family Communication: care discussed with patient.  Disposition Plan: remain in step down unit. If patient develops hypotension will need transfer to ICU.   Consultants:   GI  Cardiology    Procedures:   Flex    Antimicrobials:  none   Subjective: She report one Bloody BM today. Denies abdominal pain.   Objective: Vitals:   12/02/17 0002 12/02/17 0534 12/02/17 0642 12/02/17 0736  BP: (!) 101/53 (!) 95/54  112/60  Pulse: 86   63  Resp: 19   18  Temp: 97.9 F (36.6 C) 98 F (36.7 C)  98.1 F (36.7 C)  TempSrc: Oral   Oral  SpO2: 92%   97%  Weight:   85.4 kg   Height:        Intake/Output Summary (Last 24 hours) at 12/02/2017 0740 Last data filed at 12/02/2017 0600 Gross per 24 hour  Intake 1420 ml  Output 1000 ml  Net 420 ml   Filed Weights   11/30/17 0925 12/01/17 0329 12/02/17 0642  Weight: 84.4 kg 85 kg 85.4 kg    Examination:  General exam: Appears calm and comfortable  Respiratory system: Clear to auscultation. Respiratory effort normal. Cardiovascular system: S1 & S2 heard, RRR. No JVD, murmurs, rubs, gallops or clicks. No pedal edema. Gastrointestinal system: Abdomen is nondistended, soft and nontender. No organomegaly or masses felt. Normal bowel sounds heard. Central nervous system: Alert and oriented. No focal neurological deficits. Extremities: Symmetric 5 x 5 power. Skin: No  rashes, lesions or ulcers    Data Reviewed: I have personally reviewed following labs and imaging studies  CBC: Recent Labs  Lab 11/29/17 0550  11/30/17 2343 12/01/17 0823 12/01/17 1520 12/01/17 2356 12/02/17 0444  WBC 10.3   < > 11.6* 12.4* 13.7* 13.8* 11.8*  NEUTROABS 7.6  --   --   --   --   --   --   HGB 12.7   < > 8.3* 8.6* 8.6* 8.6* 7.7*  HCT 41.0   < > 25.8* 27.1* 27.1* 26.9* 23.7*  MCV 96.5   < > 93.8 94.4 94.1  94.7 93.3  PLT 273   < > 206 211 210 198 197   < > = values in this interval not displayed.   Basic Metabolic Panel: Recent Labs  Lab 11/29/17 0550 11/30/17 0413 12/02/17 0444  NA 139 138 136  K 3.9 4.4 4.1  CL 112* 114* 113*  CO2 16* 18* 19*  GLUCOSE 69* 87 103*  BUN 32* 29* 19  CREATININE 1.30* 1.19* 1.04*  CALCIUM 8.1* 8.1* 8.0*   GFR: Estimated Creatinine Clearance: 50.6 mL/min (A) (by C-G formula based on SCr of 1.04 mg/dL (H)). Liver Function Tests: Recent Labs  Lab 11/29/17 0550 11/30/17 0413  AST 26 22  ALT 42 40  ALKPHOS 48 46  BILITOT 0.7 0.9  PROT 5.6* 5.3*  ALBUMIN 3.1* 2.9*   Recent Labs  Lab 11/29/17 0550  LIPASE 46   No results for input(s): AMMONIA in the last 168 hours. Coagulation Profile: Recent Labs  Lab 11/29/17 0550  INR 2.09   Cardiac Enzymes: Recent Labs  Lab 11/29/17 0550 11/29/17 1209 11/29/17 1553  TROPONINI 0.11* 0.15* 0.16*   BNP (last 3 results) No results for input(s): PROBNP in the last 8760 hours. HbA1C: No results for input(s): HGBA1C in the last 72 hours. CBG: Recent Labs  Lab 11/30/17 1129  GLUCAP 78   Lipid Profile: No results for input(s): CHOL, HDL, LDLCALC, TRIG, CHOLHDL, LDLDIRECT in the last 72 hours. Thyroid Function Tests: No results for input(s): TSH, T4TOTAL, FREET4, T3FREE, THYROIDAB in the last 72 hours. Anemia Panel: No results for input(s): VITAMINB12, FOLATE, FERRITIN, TIBC, IRON, RETICCTPCT in the last 72 hours. Sepsis Labs: No results for input(s): PROCALCITON, LATICACIDVEN in the last 168 hours.  Recent Results (from the past 240 hour(s))  C difficile quick scan w PCR reflex     Status: None   Collection Time: 11/29/17  8:12 AM  Result Value Ref Range Status   C Diff antigen NEGATIVE NEGATIVE Final   C Diff toxin NEGATIVE NEGATIVE Final   C Diff interpretation No C. difficile detected.  Final    Comment: Performed at [facility], [place] [address], [place], [place] [address]    Gastrointestinal Panel by PCR , Stool     Status: None   Collection Time: 11/29/17  8:12 AM  Result Value Ref Range Status   Campylobacter species NOT DETECTED NOT DETECTED Final   Plesimonas shigelloides NOT DETECTED NOT DETECTED Final   Salmonella species NOT DETECTED NOT DETECTED Final   Yersinia enterocolitica NOT DETECTED NOT DETECTED Final   Vibrio species NOT DETECTED NOT DETECTED Final   Vibrio cholerae NOT DETECTED NOT DETECTED Final   Enteroaggregative E coli (EAEC) NOT DETECTED NOT DETECTED Final   Enteropathogenic E coli (EPEC) NOT DETECTED NOT DETECTED Final   Enterotoxigenic E coli (ETEC) NOT DETECTED NOT DETECTED Final   Shiga like toxin producing E coli (STEC)  NOT DETECTED NOT DETECTED Final   Shigella/Enteroinvasive E coli (EIEC) NOT DETECTED NOT DETECTED Final   Cryptosporidium NOT DETECTED NOT DETECTED Final   Cyclospora cayetanensis NOT DETECTED NOT DETECTED Final   Entamoeba histolytica NOT DETECTED NOT DETECTED Final   Giardia lamblia NOT DETECTED NOT DETECTED Final   Adenovirus F40/41 NOT DETECTED NOT DETECTED Final   Astrovirus NOT DETECTED NOT DETECTED Final   Norovirus GI/GII NOT DETECTED NOT DETECTED Final   Rotavirus A NOT DETECTED NOT DETECTED Final   Sapovirus (I, II, IV, and V) NOT DETECTED NOT DETECTED Final    Comment: Performed at [facility], [address] [place], [place] [address]  MRSA PCR Screening     Status: None   Collection Time: 11/30/17 12:23 PM  Result Value Ref Range Status   MRSA by PCR NEGATIVE NEGATIVE Final    Comment:        The GeneXpert MRSA Assay (FDA approved for NASAL specimens only), is one component of a comprehensive MRSA colonization surveillance program. It is not intended to diagnose MRSA infection nor to guide or monitor treatment for MRSA infections. Performed at [facility], [place] [address], [place], [place] [address]          Radiology Studies: Nm Gi Blood Loss  Result Date:  11/30/2017 CLINICAL DATA:  72 year old female with rectal bleeding. EXAM: NUCLEAR MEDICINE GASTROINTESTINAL BLEEDING SCAN TECHNIQUE: Sequential abdominal images were obtained following intravenous administration of Tc-67mlabeled red blood cells. RADIOPHARMACEUTICALS:  25.8 mCi Tc-941mertechnetate in-vitro labeled red cells. COMPARISON:  None. FINDINGS: No evidence of active gastrointestinal bleeding after 2 hours of imaging. Normal renal uptake and excretion into the bladder. IMPRESSION: Negative GI bleeding study. Electronically Signed   By: HeJacqulynn Cadet.D.   On: 11/30/2017 16:56        Scheduled Meds: . atorvastatin  80 mg Oral Q lunch  . colchicine  0.6 mg Oral Daily  . dronedarone  400 mg Oral BID WC  . ezetimibe  10 mg Oral Daily  . ipratropium-albuterol  3 mL Nebulization TID  . levothyroxine  25 mcg Oral QAC breakfast  . macitentan  10 mg Oral Daily  . potassium chloride SA  10 mEq Oral QHS  . predniSONE  10 mg Oral BID WC  . Selexipag  1,600 mcg Oral BID   Continuous Infusions: . famotidine (PEPCID) IV 20 mg (12/01/17 2159)     LOS: 2 days    Time spent: 35 minutes.     BeElmarie ShileyMD [facility] Pager [phone] 7PM-7AM, please contact night-coverage [email or web address] Password [facility]/28/2019, 7:40 AM

## 2017-12-02 NOTE — Progress Notes (Addendum)
Progress Note  Patient Name: Amy [name] Date of Encounter: 12/02/2017  Primary Cardiologist: Dr. [name], MD  Primary Electrophysiologist:  Dr. Virl Axe, MD   Subjective   Pt feeling well today. Denies chest pain or palpitations. Continues to have rectal bleeding. Plan for colonoscopy tomorrow per GI  Inpatient Medications    Scheduled Meds: . atorvastatin  80 mg Oral Q lunch  . colchicine  0.6 mg Oral Daily  . dronedarone  400 mg Oral BID WC  . ezetimibe  10 mg Oral Daily  . ipratropium-albuterol  3 mL Nebulization TID  . levothyroxine  25 mcg Oral QAC breakfast  . macitentan  10 mg Oral Daily  . predniSONE  10 mg Oral BID WC  . Selexipag  1,600 mcg Oral BID   Continuous Infusions: . famotidine (PEPCID) IV 20 mg (12/01/17 2159)   PRN Meds: acetaminophen **OR** acetaminophen, bisacodyl, HYDROcodone-acetaminophen, ondansetron **OR** ondansetron (ZOFRAN) IV, senna-docusate   Vital Signs    Vitals:   12/02/17 0002 12/02/17 0534 12/02/17 0642 12/02/17 0736  BP: (!) 101/53 (!) 95/54  112/60  Pulse: 86   63  Resp: 19   18  Temp: 97.9 F (36.6 C) 98 F (36.7 C)  98.1 F (36.7 C)  TempSrc: Oral   Oral  SpO2: 92%   97%  Weight:   85.4 kg   Height:        Intake/Output Summary (Last 24 hours) at 12/02/2017 0820 Last data filed at 12/02/2017 0600 Gross per 24 hour  Intake 1420 ml  Output 1000 ml  Net 420 ml   Filed Weights   11/30/17 0925 12/01/17 0329 12/02/17 0642  Weight: 84.4 kg 85 kg 85.4 kg    Physical Exam   General: Well developed, well nourished, NAD Skin: Warm, dry, intact  Head: Normocephalic, atraumatic, clear, moist mucus membranes. Neck: Negative for carotid bruits. No JVD Lungs:Diminished bilaterally. No wheezes, rales, or rhonchi. Breathing is unlabored. Cardiovascular: RRR with S1 S2. No murmurs, rubs, gallops, or LV heave appreciated. Abdomen: Soft, non-tender, non-distended with normoactive bowel sounds.  No obvious  abdominal masses. MSK: Strength and tone appear normal for age. 5/5 in all extremities Extremities: No edema. No clubbing or cyanosis. DP/PT pulses 2+ bilaterally Neuro: Alert and oriented. No focal deficits. No facial asymmetry. MAE spontaneously. Psych: Responds to questions appropriately with normal affect.    Labs    Chemistry Recent Labs  Lab 11/29/17 0550 11/30/17 0413 12/02/17 0444  NA 139 138 136  K 3.9 4.4 4.1  CL 112* 114* 113*  CO2 16* 18* 19*  GLUCOSE 69* 87 103*  BUN 32* 29* 19  CREATININE 1.30* 1.19* 1.04*  CALCIUM 8.1* 8.1* 8.0*  PROT 5.6* 5.3*  --   ALBUMIN 3.1* 2.9*  --   AST 26 22  --   ALT 42 40  --   ALKPHOS 48 46  --   BILITOT 0.7 0.9  --   GFRNONAA 40* 44* 52*  GFRAA 46* 52* >60  ANIONGAP 11 6 4*    Hematology Recent Labs  Lab 12/01/17 1520 12/01/17 2356 12/02/17 0444  WBC 13.7* 13.8* 11.8*  RBC 2.88* 2.84* 2.54*  HGB 8.6* 8.6* 7.7*  HCT 27.1* 26.9* 23.7*  MCV 94.1 94.7 93.3  MCH 29.9 30.3 30.3  MCHC 31.7 32.0 32.5  RDW 16.6* 16.7* 16.4*  PLT 210 198 197   Cardiac Enzymes Recent Labs  Lab 11/29/17 0550 11/29/17 1209 11/29/17 1553  TROPONINI 0.11* 0.15*  0.16*   No results for input(s): TROPIPOC in the last 168 hours.   BNPNo results for input(s): BNP, PROBNP in the last 168 hours.   DDimer No results for input(s): DDIMER in the last 168 hours.   Radiology    Nm Gi Blood Loss  Result Date: 11/30/2017 CLINICAL DATA:  73 year old female with rectal bleeding. EXAM: NUCLEAR MEDICINE GASTROINTESTINAL BLEEDING SCAN TECHNIQUE: Sequential abdominal images were obtained following intravenous administration of Tc-39mlabeled red blood cells. RADIOPHARMACEUTICALS:  25.8 mCi Tc-931mertechnetate in-vitro labeled red cells. COMPARISON:  None. FINDINGS: No evidence of active gastrointestinal bleeding after 2 hours of imaging. Normal renal uptake and excretion into the bladder. IMPRESSION: Negative GI bleeding study. Electronically Signed   By:  Amy [name].D.   On: 11/30/2017 16:56    Telemetry    12/02/17 NSR - Personally Reviewed  ECG    No new tracing as of 12/02/17 - Personally Reviewed  Cardiac Studies   ECHO: 09/08/2017: Study Conclusions  - Left ventricle: The cavity size was normal. Wall thickness was increased in a pattern of moderate LVH. Thickening of the LV apex consistent with apical variant HCM. Systolic function was vigorous. The estimated ejection fraction was in the range of 65% to 70%. Wall motion was normal; there were no regional wall motion abnormalities. Doppler parameters are consistent with restrictive left ventricular relaxation (grade 3 diastolic dysfunction). The E/A ratio is >2. The E/e&' ratio is >20, suggesting markedly elevated LV filling pressure. - Mitral valve: Calcified annulus. Mildly thickened leaflets . There was mild regurgitation. - Left atrium: Severely dilated. - Atrial septum: Septum bows left to right, suggesting high LA pressure. Negative for PFO or late shunting by saline microbubble contrast. - Pulmonary arteries: PA peak pressure: 42 mm Hg (S). - Pericardium, extracardiac: A trivial pericardial effusion was identified posterior to the heart. Features were not consistent with tamponade physiology.  Impressions:  - Compared with a prior study in 06/2017, the LVEF is unchanged. The degree of diastolic dysfunction is worse with a higher LV filling pressure.  CATH: 06/10/2017: Right heart catheterization Mild to moderate pulmonary arterial hypertension, absolute numbers are lower than on RHC in 3/18. PVR remains elevated.  2. Low cardiac output. CO was low on 3/18 cath but today's numbers appear worse.  3. Normal filling pressures.   Is cardiac output low solely due to pulmonary hypertension? Surprisingly RA pressure is not elevated.  - Will get echo today.  - Continue titration of selexipag upwards. ?Consider IV Flolan.  -  She is going to see pulmonary in a couple of weeks.   Patient Profile     89.o. female with a hx of persistent atrial fibrillation on Xarelto and Multaq (s/p ablation per Dr. AlRayann [name] with recurrent AF after being started on prednisone), Hypertrophic CM by cardiac MRI with mild to moderate pulmonary hypertension (followed by Dr. McAundra [name] Dr. McLake BellsAlso a history of hyperlipidemia, hypothyroidism, diastolic heart failure, nonobstructive CAD per cath 10/2017 with recommendations for medical management and  OSA on CPAP who is being seen today for preoperative evaluation at the request of Drs GhCruzita Lederernd BrAlessandra Bevels Assessment & Plan    1. Preoperative clearance for possible colonoscopy: -Pt is at increased, but not prohibitive risk for EGD/colonoscopy  -Keep close attention to VS during procedure>>> need to clarify source of bleeding given continual drop in Hb , now 7.7 -Plan for colonoscopy tomorrow, 12/04/17 per GI  -Anticoagulation on hold, will resume once cleared by  GI -Continue Multaq  2. GI bleed: -Per GI/IM -Plan for colonoscopy per GI   3.Paroxysmal atrial fibrillation: -HR controlled 60-70's, continue Multaq -Xarelto on hold, will resume once cleared by GI -CHA2DS2VASc =4 (at least )  4.  Pulmonary HTN: -Followed by Dr. Aundra Dubin and Dr. [place] Bells -On Opsumit, Cellcept, Selexipag and prednisone>>currently undergoing rheumatology workup secondary to elevated ANA and concern for underlying autoimmune disease -Worsening dyspnea and O2 demand despite improvement per right heart cath>>>see above  -Monitor BP closely     5. HLD: -Continue lipitor, zetia  6. Jehovah's witness: -Refusing blood products  Signed, Kathyrn Drown NP-C [facility] Pager: [phone] 12/02/2017, 8:20 AM    Pt to be seen by Einar Crow (CHF service) who follows her in clinic   Dorris Carnes   For questions or updates, please contact   Please consult [email or web address] for contact info under  Cardiology/STEMI.

## 2017-12-02 NOTE — Progress Notes (Signed)
[facility] Progress Note  Amy [name] 72 y.o. 04/26/1945  CC:  Rectal bleeding   Subjective:she continues to have rectal bleeding. Drop in hemoglobin noted. Denied abdominal pain, nausea vomiting.  ROS : Baseline shortness of breath. Negative for any acute chest pain.   Objective: Vital signs in last 24 hours: Vitals:   12/02/17 0736 12/02/17 0820  BP: 112/60   Pulse: 63   Resp: 18   Temp: 98.1 F (36.7 C)   SpO2: 97% 98%    Physical Exam:  Gen. Alert/03. Not in acute distress Heart : RRR  Abdomen. Soft, nontender, nondistended, bowel sounds present. Psych : Mood and affect normal   Lab Results: Recent Labs    11/30/17 0413 12/02/17 0444  NA 138 136  K 4.4 4.1  CL 114* 113*  CO2 18* 19*  GLUCOSE 87 103*  BUN 29* 19  CREATININE 1.19* 1.04*  CALCIUM 8.1* 8.0*   Recent Labs    11/30/17 0413  AST 22  ALT 40  ALKPHOS 46  BILITOT 0.9  PROT 5.3*  ALBUMIN 2.9*   Recent Labs    12/01/17 2356 12/02/17 0444  WBC 13.8* 11.8*  HGB 8.6* 7.7*  HCT 26.9* 23.7*  MCV 94.7 93.3  PLT 198 197   No results for input(s): LABPROT, INR in the last 72 hours.    Assessment/Plan: - painless rectal bleeding while on Xarelto..most likely hemorrhoidal vs  diverticular in nature. - acute blood loss anemia. Mild drop in hemoglobin noted - Bloody diarrhea.- C. Difficile and GI pathogen panel negative. -  atrial fibrillation. Last dose of Xarelto  08/24  - History of pulmonary hypertension. - History of atypical cardiomyopathy. 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 Negative virtual colonoscopy last year. - mildly elevated troponins. Patient denies any new chest pain. - Jehovah's Witness  Recommendations ------------------------- - appreciate cardiology  input. Patient is at high-risk but not prohibitive risk for colonoscopy.because of ongoing bleeding, drop in hemoglobin and given the fact that patient is Jehovah's Witness, we will proceed  with colonoscopy tomorrow. - monitor H&H. Consider IV iron infusion.  Risks (bleeding, infection, bowel perforation that could require surgery, sedation-related changes in cardiopulmonary systems), benefits (identification and possible treatment of source of symptoms, exclusion of certain causes of symptoms), and alternatives (watchful waiting, radiographic imaging studies, empiric medical treatment)  were explained to patient in detail and patient wishes to proceed.  - flexible sigmoidoscopy 08/26  showed old and clotted blood but no evidence of active bleeding. It showed diverticulosis Bleeding scan negative.. Patient thinks her increase risk of bleeding is secondary to CellCept in combination with Xarelto..CellCept does have increase risk of gastrointestinal hemorrhage with the incident rate of 3 to less than 20% according to Uptodate. Patient is refusing to take CellCept.   Isabele Lollar MD, FACP 12/02/2017, 8:52 AM  Contact #  [phone] 

## 2017-12-03 ENCOUNTER — Inpatient Hospital Stay (HOSPITAL_COMMUNITY): Payer: Medicare Other | Admitting: Anesthesiology

## 2017-12-03 ENCOUNTER — Encounter (HOSPITAL_COMMUNITY)
Admission: EM | Disposition: A | Discharge status: Home / Self Care | Payer: Self-pay | Source: Home / Self Care | Attending: Internal Medicine

## 2017-12-03 ENCOUNTER — Encounter (HOSPITAL_COMMUNITY): Payer: Self-pay

## 2017-12-03 HISTORY — PX: BIOPSY: SHX5522

## 2017-12-03 HISTORY — PX: COLONOSCOPY WITH PROPOFOL: SHX5780

## 2017-12-03 LAB — BASIC METABOLIC PANEL
Anion gap: 7 (ref 5–15)
BUN: 12 mg/dL (ref 8–23)
CALCIUM: 8.3 mg/dL — AB (ref 8.9–10.3)
CHLORIDE: 113 mmol/L — AB (ref 98–111)
CO2: 19 mmol/L — ABNORMAL LOW (ref 22–32)
CREATININE: 1.05 mg/dL — AB (ref 0.44–1.00)
GFR, EST AFRICAN AMERICAN: 60 mL/min — AB (ref >60)
GFR, EST NON AFRICAN AMERICAN: 52 mL/min — AB (ref >60)
Glucose, Bld: 108 mg/dL — ABNORMAL HIGH (ref 70–99)
Potassium: 4.3 mmol/L (ref 3.5–5.1)
Sodium: 139 mmol/L (ref 135–145)

## 2017-12-03 LAB — CBC
HCT: 23.7 % — ABNORMAL LOW (ref 36.0–46.0)
Hemoglobin: 7.6 g/dL — ABNORMAL LOW (ref 12.0–15.0)
MCH: 30.3 pg (ref 26.0–34.0)
MCHC: 32.1 g/dL (ref 30.0–36.0)
MCV: 94.4 fL (ref 78.0–100.0)
PLATELETS: 202 10*3/uL (ref 150–400)
RBC: 2.51 MIL/uL — AB (ref 3.87–5.11)
RDW: 16.7 % — AB (ref 11.5–15.5)
WBC: 11.7 10*3/uL — AB (ref 4.0–10.5)

## 2017-12-03 SURGERY — COLONOSCOPY WITH PROPOFOL
Anesthesia: Monitor Anesthesia Care

## 2017-12-03 MED ORDER — SODIUM CHLORIDE 0.9 % IV SOLN
INTRAVENOUS | Status: DC | PRN
  Administered 2017-12-03: 12:00:00 via INTRAVENOUS

## 2017-12-03 MED ORDER — PROPOFOL 500 MG/50ML IV EMUL
INTRAVENOUS | Status: DC | PRN
  Administered 2017-12-03: 50 ug/kg/min via INTRAVENOUS

## 2017-12-03 MED ORDER — PHENYLEPHRINE HCL 10 MG/ML IJ SOLN
INTRAMUSCULAR | Status: DC | PRN
  Administered 2017-12-03: 40 ug via INTRAVENOUS
  Administered 2017-12-03: 60 ug via INTRAVENOUS
  Administered 2017-12-03: 20 ug via INTRAVENOUS
  Administered 2017-12-03: 40 ug via INTRAVENOUS

## 2017-12-03 MED ORDER — PROPOFOL 10 MG/ML IV BOLUS
INTRAVENOUS | Status: DC | PRN
  Administered 2017-12-03: 30 mg via INTRAVENOUS
  Administered 2017-12-03 (×3): 20 mg via INTRAVENOUS

## 2017-12-03 SURGICAL SUPPLY — 22 items

## 2017-12-03 NOTE — Progress Notes (Signed)
Pt placed on CPAP for the night- tolerating well.

## 2017-12-03 NOTE — Transfer of Care (Signed)
Immediate Anesthesia Transfer of Care Note  Patient: Amy [name]  Procedure(s) Performed: COLONOSCOPY WITH PROPOFOL (N/A ) BIOPSY  Patient Location: Endoscopy Unit  Anesthesia Type:MAC  Level of Consciousness: awake, alert  and oriented  Airway & Oxygen Therapy: Patient Spontanous Breathing and Patient connected to nasal cannula oxygen  Post-op Assessment: Report given to RN, Post -op Vital signs reviewed and stable and Patient moving all extremities X 4  Post vital signs: Reviewed and stable  Last Vitals:  Vitals Value Taken Time  BP    Temp    Pulse 65 12/03/2017  1:18 PM  Resp 16 12/03/2017  1:18 PM  SpO2 100 % 12/03/2017  1:18 PM  Vitals shown include unvalidated device data.  Last Pain:  Vitals:   12/03/17 1250  TempSrc:   PainSc: 0-No pain      Patients Stated Pain Goal: 0 (03/70/96 4383)  Complications: No apparent anesthesia complications

## 2017-12-03 NOTE — Anesthesia Procedure Notes (Signed)
Procedure Name: MAC Date/Time: 12/03/2017 12:02 PM Performed by: Neldon Newport, CRNA Pre-anesthesia Checklist: Timeout performed, Patient being monitored, Suction available, Emergency Drugs available and Patient identified Patient Re-evaluated:Patient Re-evaluated prior to induction Oxygen Delivery Method: Simple face mask Placement Confirmation: positive ETCO2 and breath sounds checked- equal and bilateral

## 2017-12-03 NOTE — Interval H&P Note (Signed)
History and Physical Interval Note:  12/03/2017 11:51 AM  Amy [name] A Whren-[name]  has presented today for surgery, with the diagnosis of Rectal bleeding  The various methods of treatment have been discussed with the patient and family. After consideration of risks, benefits and other options for treatment, the patient has consented to  Procedure(s): COLONOSCOPY WITH PROPOFOL (N/A) as a surgical intervention .  The patient's history has been reviewed, patient examined, no change in status, stable for surgery.  I have reviewed the patient's chart and labs.  Questions were answered to the patient's satisfaction.     Shuntell Foody

## 2017-12-03 NOTE — Anesthesia Preprocedure Evaluation (Addendum)
Anesthesia Evaluation  Patient identified by MRN, date of birth, ID band Patient awake    Reviewed: Allergy & Precautions, NPO status , Patient's Chart, lab work & pertinent test results  History of Anesthesia Complications Negative for: history of anesthetic complications  Airway Mallampati: II  TM Distance: >3 FB Neck ROM: full    Dental  (+) Teeth Intact, Dental Advisory Given   Pulmonary asthma , sleep apnea, Continuous Positive Airway Pressure Ventilation and Oxygen sleep apnea , COPD,  oxygen dependent, former smoker,    breath sounds clear to auscultation       Cardiovascular hypertension, Pt. on home beta blockers and Pt. on medications + CAD, + Past MI, +CHF and + DOE  + dysrhythmias Atrial Fibrillation  Rhythm:Regular Rate:Normal   '19 TTE - Moderate LVH. Thickening of the LV apex   consistent with apical variant HCM. EF 65% to 70%. Grade 3 diastolic dysfunction. Mild MR, severely dilated LA. Atrial septum bows left to right, suggesting high LA   pressure. PA peak pressure: 42 mm Hg. A trivial pericardial effusion was identified posterior to the heart. Features were not consistent with tamponade physiology.  '19 RHC - 1. Mild to moderate pulmonary arterial hypertension, absolute numbers are lower than on RHC in 3/18.  PVR remains elevated.  2. Low cardiac output.  CO was low on 3/18 cath but today's numbers appear worse.  3. Normal filling pressures.   '16 LHC - 1.Ost 1st Diag to 1st Diag lesion, 75% stenosed. This represents mild progression of disease but otherwise no significant disease throughout the coronary system. 2.Demand Ischemia the Myocardium - related to existing disease, hypertrophic cardiomyopathy and atrial flutter with rapid ventricular rate 3.Mildly elevated LVEDP  EKG - SR, lateral TWI   Neuro/Psych negative neurological ROS  negative psych ROS   GI/Hepatic Neg liver ROS, GERD  Controlled,   Endo/Other  Hypothyroidism  Obesity   Renal/GU CRFRenal disease  negative genitourinary   Musculoskeletal negative musculoskeletal ROS (+)   Abdominal   Peds  Hematology  (+) REFUSES BLOOD PRODUCTS, JEHOVAH'S WITNESS  Anesthesia Other Findings   Reproductive/Obstetrics                           Anesthesia Physical Anesthesia Plan  ASA: IV  Anesthesia Plan: MAC   Post-op Pain Management:    Induction: Intravenous  PONV Risk Score and Plan: 2 and Propofol infusion and Treatment may vary due to age or medical condition  Airway Management Planned: Nasal Cannula and Natural Airway  Additional Equipment: None  Intra-op Plan:   Post-operative Plan:   Informed Consent: I have reviewed the patients History and Physical, chart, labs and discussed the procedure including the risks, benefits and alternatives for the proposed anesthesia with the patient or authorized representative who has indicated his/her understanding and acceptance.   Dental Advisory Given  Plan Discussed with: CRNA and Anesthesiologist  Anesthesia Plan Comments:       Anesthesia Quick Evaluation

## 2017-12-03 NOTE — Progress Notes (Addendum)
PROGRESS NOTE    Amy [name]  TSV:[id number] DOB: 10/06/1945 DOA: 11/29/2017 PCP: Aretta Nip, MD    Brief Narrative: Amy [name] a 72 y.o.femalewith medical history significant forparoxysmal atrial fibrillation on Xarelto, hypertension, hyperlipidemia, hypothyroidism diastolic heart failure, CAD, with catheterization in October 07, 2017, nonobstructive CAD medical management, OSA on CPAP, as well as apical H COM by cardiac MRI, with mild to moderate pulmonary hypertension on chronic O2 4L [place], on CellCept, OPSUMIT Uptravi, followed by cardiology and pulmonology,presenting to the emergency department with bright red blood per rectum.  Gastroenterology was consulted.  Assessment & Plan:   Active Problems:   Asthmatic bronchitis , chronic (HCC)   Hypothyroidism   Essential hypertension   Apical variant hypertrophic cardiomyopathy (HCC)   Chronic diastolic heart failure (HCC)   OSA (obstructive sleep apnea)   CAD (coronary artery disease)   PAF (paroxysmal atrial fibrillation) (HCC)   Chronic respiratory failure with hypoxia (HCC)   GIB (gastrointestinal bleeding)   1-Acute lower GI bleed;  Transfer to step down 8-26 due to hypotension and persistent GI bleed.  GI consulted and following, S/P Flex sig on 8-26 unrevealing.  Nuclear scan did not showed source of bleeding.  Evaluated by cardiology high risk for procedure, but not prohibited.  Received IV iron 8-28 Limit blood drawn, started  B 12 and folic acid.  Hb stable today at 7.6. Per nurse stool more brownish.  Colonoscopy today. Oxygen sat stable on chronic 4 L.  Discussed with cardiology, will consider start anticoagulation tomorrow.   Chronic diastolic CHF / Pulmonary hypertension / Chronic hypoxic respiratory failure On chronic 4 L oxygen.  Holding lasix due to GI bleed.  Stable.   CAD; cath July 2019, non obstructive CAD  H COM by cardiac MRI, with mild to moderate pulmonary hypertension,  P-ANCA+, CCP+, RO-52+ Holding Cellcept.    PAF hold xarelto.   CKD 3 Stable.   HLD;  On statins.   Hypothyroidism;  Continue with synthroid.   Transient numbness finger;  Monitor, rest neuro exam unremarkable.  Resolved./   DVT prophylaxis: scd Code Status:  Full code.  Family Communication: care discussed with patient.  Disposition Plan: remain in step down unit. If patient develops hypotension will need transfer to ICU.   Consultants:   GI  Cardiology    Procedures:   Flex    Antimicrobials:  none   Subjective: Feeling ok, had multiples Bowel movement yesterday with prep.  Per nurse , BM yesterday with old blood clot.  Stool today less bloody.  Numbness resolved.  Denies dyspnea, and chest pain /   Objective: Vitals:   12/02/17 2032 12/02/17 2142 12/02/17 2340 12/03/17 0530  BP:   (!) 92/51 (!) 92/52  Pulse:   76 86  Resp:   16 20  Temp:  98.2 F (36.8 C) 97.7 F (36.5 C) 98 F (36.7 C)  TempSrc:  Oral Axillary Oral  SpO2: 96%  92% 96%  Weight:    87 kg  Height:        Intake/Output Summary (Last 24 hours) at 12/03/2017 0845 Last data filed at 12/03/2017 0600 Gross per 24 hour  Intake 5180 ml  Output 450 ml  Net 4730 ml   Filed Weights   12/01/17 0329 12/02/17 0642 12/03/17 0530  Weight: 85 kg 85.4 kg 87 kg    Examination:  General exam: NAD Respiratory system: CTA Cardiovascular system: S 1, S 2 RRR Gastrointestinal system: BS present, soft, nt Central nervous  system: alert, non focal.  Extremities: Symmetric power Skin: No rashes    Data Reviewed: I have personally reviewed following labs and imaging studies  CBC: Recent Labs  Lab 11/29/17 0550  12/01/17 0823 12/01/17 1520 12/01/17 2356 12/02/17 0444 12/03/17 0316  WBC 10.3   < > 12.4* 13.7* 13.8* 11.8* 11.7*  NEUTROABS 7.6  --   --   --   --   --   --   HGB 12.7   < > 8.6* 8.6* 8.6* 7.7* 7.6*  HCT 41.0   < > 27.1* 27.1* 26.9* 23.7* 23.7*  MCV 96.5   < > 94.4  94.1 94.7 93.3 94.4  PLT 273   < > 211 210 198 197 202   < > = values in this interval not displayed.   Basic Metabolic Panel: Recent Labs  Lab 11/29/17 0550 11/30/17 0413 12/02/17 0444 12/03/17 0316  NA 139 138 136 139  K 3.9 4.4 4.1 4.3  CL 112* 114* 113* 113*  CO2 16* 18* 19* 19*  GLUCOSE 69* 87 103* 108*  BUN 32* 29* 19 12  CREATININE 1.30* 1.19* 1.04* 1.05*  CALCIUM 8.1* 8.1* 8.0* 8.3*   GFR: Estimated Creatinine Clearance: 50.6 mL/min (A) (by C-G formula based on SCr of 1.05 mg/dL (H)). Liver Function Tests: Recent Labs  Lab 11/29/17 0550 11/30/17 0413  AST 26 22  ALT 42 40  ALKPHOS 48 46  BILITOT 0.7 0.9  PROT 5.6* 5.3*  ALBUMIN 3.1* 2.9*   Recent Labs  Lab 11/29/17 0550  LIPASE 46   No results for input(s): AMMONIA in the last 168 hours. Coagulation Profile: Recent Labs  Lab 11/29/17 0550  INR 2.09   Cardiac Enzymes: Recent Labs  Lab 11/29/17 0550 11/29/17 1209 11/29/17 1553  TROPONINI 0.11* 0.15* 0.16*   BNP (last 3 results) No results for input(s): PROBNP in the last 8760 hours. HbA1C: No results for input(s): HGBA1C in the last 72 hours. CBG: Recent Labs  Lab 11/30/17 1129  GLUCAP 78   Lipid Profile: No results for input(s): CHOL, HDL, LDLCALC, TRIG, CHOLHDL, LDLDIRECT in the last 72 hours. Thyroid Function Tests: No results for input(s): TSH, T4TOTAL, FREET4, T3FREE, THYROIDAB in the last 72 hours. Anemia Panel: No results for input(s): VITAMINB12, FOLATE, FERRITIN, TIBC, IRON, RETICCTPCT in the last 72 hours. Sepsis Labs: No results for input(s): PROCALCITON, LATICACIDVEN in the last 168 hours.  Recent Results (from the past 240 hour(s))  C difficile quick scan w PCR reflex     Status: None   Collection Time: 11/29/17  8:12 AM  Result Value Ref Range Status   C Diff antigen NEGATIVE NEGATIVE Final   C Diff toxin NEGATIVE NEGATIVE Final   C Diff interpretation No C. difficile detected.  Final    Comment: Performed at [facility], [place] [address], [place], [place] [address]  Gastrointestinal Panel by PCR , Stool     Status: None   Collection Time: 11/29/17  8:12 AM  Result Value Ref Range Status   Campylobacter species NOT DETECTED NOT DETECTED Final   Plesimonas shigelloides NOT DETECTED NOT DETECTED Final   Salmonella species NOT DETECTED NOT DETECTED Final   Yersinia enterocolitica NOT DETECTED NOT DETECTED Final   Vibrio species NOT DETECTED NOT DETECTED Final   Vibrio cholerae NOT DETECTED NOT DETECTED Final   Enteroaggregative E coli (EAEC) NOT DETECTED NOT DETECTED Final   Enteropathogenic E coli (EPEC) NOT DETECTED NOT DETECTED Final   Enterotoxigenic E  coli (ETEC) NOT DETECTED NOT DETECTED Final   Shiga like toxin producing E coli (STEC) NOT DETECTED NOT DETECTED Final   Shigella/Enteroinvasive E coli (EIEC) NOT DETECTED NOT DETECTED Final   Cryptosporidium NOT DETECTED NOT DETECTED Final   Cyclospora cayetanensis NOT DETECTED NOT DETECTED Final   Entamoeba histolytica NOT DETECTED NOT DETECTED Final   Giardia lamblia NOT DETECTED NOT DETECTED Final   Adenovirus F40/41 NOT DETECTED NOT DETECTED Final   Astrovirus NOT DETECTED NOT DETECTED Final   Norovirus GI/GII NOT DETECTED NOT DETECTED Final   Rotavirus A NOT DETECTED NOT DETECTED Final   Sapovirus (I, II, IV, and V) NOT DETECTED NOT DETECTED Final    Comment: Performed at [facility], [address] [place], [place] [address]  MRSA PCR Screening     Status: None   Collection Time: 11/30/17 12:23 PM  Result Value Ref Range Status   MRSA by PCR NEGATIVE NEGATIVE Final    Comment:        The GeneXpert MRSA Assay (FDA approved for NASAL specimens only), is one component of a comprehensive MRSA colonization surveillance program. It is not intended to diagnose MRSA infection nor to guide or monitor treatment for MRSA infections. Performed at [facility], [place] [address], [place],  [address]           Radiology Studies: No results found.      Scheduled Meds: . atorvastatin  80 mg Oral Q lunch  . colchicine  0.6 mg Oral Daily  . dronedarone  400 mg Oral BID WC  . ezetimibe  10 mg Oral Daily  . folic acid  1 mg Oral Daily  . ipratropium-albuterol  3 mL Nebulization TID  . levothyroxine  25 mcg Oral QAC breakfast  . macitentan  10 mg Oral Daily  . predniSONE  10 mg Oral BID WC  . Selexipag  1,600 mcg Oral BID  . vitamin B-12  100 mcg Oral Daily   Continuous Infusions: . sodium chloride    . famotidine (PEPCID) IV Stopped (12/02/17 2240)     LOS: 3 days    Time spent: 35 minutes.     Elmarie Shiley, MD [facility] Pager [phone]  If 7PM-7AM, please contact night-coverage [email or web address] Password [facility] 12/03/2017, 8:45 AM

## 2017-12-03 NOTE — Anesthesia Postprocedure Evaluation (Signed)
Anesthesia Post Note  Patient: Amy [name]  Procedure(s) Performed: COLONOSCOPY WITH PROPOFOL (N/A ) BIOPSY     Patient location during evaluation: PACU Anesthesia Type: MAC Level of consciousness: awake and alert Pain management: pain level controlled Vital Signs Assessment: post-procedure vital signs reviewed and stable Respiratory status: spontaneous breathing, nonlabored ventilation and respiratory function stable Cardiovascular status: stable and blood pressure returned to baseline Anesthetic complications: no    Last Vitals:  Vitals:   12/03/17 1240 12/03/17 1250  BP: (!) 93/47 (!) 106/44  Pulse: 66 67  Resp: 18 20  Temp:    SpO2: 100% 100%    Last Pain:  Vitals:   12/03/17 1250  TempSrc:   PainSc: 0-No pain                 Audry Pili

## 2017-12-03 NOTE — Progress Notes (Addendum)
Advanced Heart Failure Rounding Note  PCP-Cardiologist: No primary care provider on file.   Subjective:    Hemoglobin 7.6.  Weight up 3 lbs off lasix. Creatinine stable.   Denies CP, SOB. Last bloody stool was yesterday morning. Planning for colonoscopy today.  Objective:   Weight Range: 87 kg Body mass index is 33.98 kg/m.   Vital Signs:   Temp:  [97.7 F (36.5 C)-98.4 F (36.9 C)] 98 F (36.7 C) (08/29 0530) Pulse Rate:  [70-86] 86 (08/29 0530) Resp:  [15-25] 20 (08/29 0530) BP: (92-114)/(49-78) 92/52 (08/29 0530) SpO2:  [92 %-98 %] 96 % (08/29 0530) Weight:  [87 kg] 87 kg (08/29 0530) Last BM Date: 12/02/17  Weight change: Filed Weights   12/01/17 0329 12/02/17 0642 12/03/17 0530  Weight: 85 kg 85.4 kg 87 kg    Intake/Output:   Intake/Output Summary (Last 24 hours) at 12/03/2017 0800 Last data filed at 12/03/2017 0600 Gross per 24 hour  Intake 5180 ml  Output 450 ml  Net 4730 ml      Physical Exam    General:  Well appearing. No resp difficulty HEENT: Normal Neck: Supple. JVP 6-7. Carotids 2+ bilat; no bruits. No lymphadenopathy or thyromegaly appreciated. Cor: PMI nondisplaced. Regular rate & rhythm. No rubs, gallops or murmurs. Lungs: Clear Abdomen: Soft, nontender, nondistended. No hepatosplenomegaly. No bruits or masses. Good bowel sounds. Extremities: No cyanosis, clubbing, rash, trace edema in BLE, TED hose on.  Neuro: Alert & orientedx3, cranial nerves grossly intact. moves all 4 extremities w/o difficulty. Affect pleasant   Telemetry   NSR 50-60s with PACs. Personally reviewed.   EKG    NSR with PACs, 72 bpm. Personally reviewed.   Labs    CBC Recent Labs    12/02/17 0444 12/03/17 0316  WBC 11.8* 11.7*  HGB 7.7* 7.6*  HCT 23.7* 23.7*  MCV 93.3 94.4  PLT 197 630   Basic Metabolic Panel Recent Labs    12/02/17 0444 12/03/17 0316  NA 136 139  K 4.1 4.3  CL 113* 113*  CO2 19* 19*  GLUCOSE 103* 108*  BUN 19 12    CREATININE 1.04* 1.05*  CALCIUM 8.0* 8.3*   Liver Function Tests No results for input(s): AST, ALT, ALKPHOS, BILITOT, PROT, ALBUMIN in the last 72 hours. No results for input(s): LIPASE, AMYLASE in the last 72 hours. Cardiac Enzymes No results for input(s): CKTOTAL, CKMB, CKMBINDEX, TROPONINI in the last 72 hours.  BNP: BNP (last 3 results) Recent Labs    12/11/16 1356 06/02/17 1013 10/06/17 0925  BNP 148.9* 126.0* 675.7*    ProBNP (last 3 results) No results for input(s): PROBNP in the last 8760 hours.   D-Dimer No results for input(s): DDIMER in the last 72 hours. Hemoglobin A1C No results for input(s): HGBA1C in the last 72 hours. Fasting Lipid Panel No results for input(s): CHOL, HDL, LDLCALC, TRIG, CHOLHDL, LDLDIRECT in the last 72 hours. Thyroid Function Tests No results for input(s): TSH, T4TOTAL, T3FREE, THYROIDAB in the last 72 hours.  Invalid input(s): FREET3  Other results:   Imaging     No results found.   Medications:     Scheduled Medications: . atorvastatin  80 mg Oral Q lunch  . colchicine  0.6 mg Oral Daily  . dronedarone  400 mg Oral BID WC  . ezetimibe  10 mg Oral Daily  . folic acid  1 mg Oral Daily  . ipratropium-albuterol  3 mL Nebulization TID  . levothyroxine  25  mcg Oral QAC breakfast  . macitentan  10 mg Oral Daily  . predniSONE  10 mg Oral BID WC  . Selexipag  1,600 mcg Oral BID  . vitamin B-12  100 mcg Oral Daily     Infusions: . sodium chloride    . 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  Admitted with GI bleed.   Assessment/Plan   1.  GI bleed  Jehovahs witness. No blood products.  Xarelto stopped on admit. Will need to eventually restart.  GI consulted. Flex Sigmoidoscopy with old blood and diverticulosis.  Tagged RBC negative.  Hgb continues to drift down from 12.7-->7.7 -> 7.6.  Plan for colonoscopy today   2. 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 Followed by Dr [place] Bells and Dr Aundra Dubin. Refuses cellcept given 3% risk of gastric hemorrhage. VQ scan was negative 2018.  Intolerant adcirca On opsumit and selexipag.  - Wears 4 L O2 at home. Continue for now.   3. PAF - Remains in NSR.  - on multaq 400 mg daily.  - at home she takes metoprolol as needed.  - Had A fib ablation 2017  - No amio with history of suspected amio toxicity  Off anticoagulants with GI bleed.  Continue ted hose and SCDs.   4. Chronic Diastolic HF Most recent ECHO 09/2017 EF 65-70%. Grade III DD Peak PA pressure 42 mm hg  - Volume remains stable. NPO today for colonoscopy - Continue to hold lasix.   5. Chronic Hypoxic Respiratory Failure Continue 2 liters oxygen. O2 saturations stable. No change.    Medication concerns reviewed with patient and pharmacy team. Barriers identified: none. Receives PAH meds from specialty pharmacy.   Length of Stay: [place], NP  12/03/2017, 8:00 AM  Advanced Heart Failure Team Pager [phone] (M-F; 7a - 4p)  Please contact [facility] for night-coverage after hours (4p -7a ) and weekends on [email or web address]  Patient seen with NP, agree with the above note.  SBP remains in 90s.  Hgb stable at 7.6, active bleeding seems to have stopped. NSR on telemetry.   On exam, JVP 8 cm, trace ankle edema, regular S1S2.   1. GI bleeding: Suspect lower GI bleed with hematochezia.  Xarelto held. There is some concern that GI bleeding could have been caused by Cellcept as there is some  risk of GI bleeding with Cellcept and occurred after increase in dose.  Hgb stable at 7.6, active bleeding appears to have stopped.  - No blood products, Jehovah's Witness.  - Plan for colonoscopy today.  - Stopping Cellcept for now.  - Will eventually need to restart Xarelto, will decide based on GI evaluation.  2. Chronic diastolic CHF: Echo 5/03 with EF 65-70%, normal RV size and systolic function.  She is not significantly volume overloaded by exam.  BP stable now.  - Probably restart her po Lasix tomorrow.    3. Apical hypertrophic cardiomyopathy: Cardiac MRI in 7/16 showed mid-apical LV hypertrophy with vigorous contraction. Now off metoprolol with slow HR (uses metoprolol prn tachycardia - atrial  fibrillation). ECG is consistent with apical HCM.  4. Atrial fibrillation: Remains in NSR on Multaq. Per Dr Rayann Heman, would not re-do her ablation. Breakthrough atrial fibrillation in 7/19, DCCV back to NSR in ER. NSR here.  - Xarelto held with LGIB.  - Continue dronedarone.Not ideal med with history of diastolic CHF but she has not had problems with volume overload recently.  5. Pulmonary hypertension: RHC in 3/18 showed moderate pulmonary arterial hypertension with elevated PVR (7.3 WU by Fick, 9.6 WU by thermo). She has COPD but I do not think this plays a large role in the pulmonary hypertension =>PFTs in 3/18 showed minimal obstruction and restriction, there was significantly decreased DLCO, this may be due to pulmonary vascular disease. OSA may play a role but not a large one. V/Q scan did not suggest chronic PEs in 3/18. RF borderline elevated (probably not significant), HIV negative, Anti-SCL70 negative, ANA negative, P-ANCA weakly positive. I suspected group 1 PAH, but she has not significantlyimproved with pulmonary vasodilators.I have been concerned that she could have pulmonary veno-occlusive disease or pulmonary capillary hemangiomatosis based on the response to [facility] treatment thus  far. Her RHC in 3/19 showed lower PA pressure and PVR with normal right and left heart filling pressures, but cardiac output was lower than prior. Surprisingly, her RV looked normal on echo so I question how accurate the cardiac output measurement really was. Bubble study negative on echo.I did a high resolution CT to look for evidence of ILD, but it seems to have been degraded by respiratory artifact. No severe abnormality was detected on the study. 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 She was started on Cellcept. - Cellcept now stopped due to concern it contributed to GI bleed.  - She has a history of afib ablation so pulmonary vein stenosis certainly remains a concern =>I think she should get a pulmonary vein protocol CTA eventually, should be able to do at some point soon with improvement in creatinine.  - Continue Opsumit and selexipag.  - She did not tolerate Adcirca.   [name] 12/03/2017 11:44 AM

## 2017-12-03 NOTE — Brief Op Note (Addendum)
11/29/2017 - 12/03/2017  12:48 PM  PATIENT:  Amy [name]  72 y.o. female  PRE-OPERATIVE DIAGNOSIS:  Rectal bleeding  POST-OPERATIVE DIAGNOSIS:  diverticulosis, simoigd diverticulum ulcer, IC valve biopsies for lipoma  PROCEDURE:  Procedure(s): COLONOSCOPY WITH PROPOFOL (N/A) BIOPSY  SURGEON:  Surgeon(s) and Role:    * Avery Klingbeil, MD - Primary  Findings ----------- - Colonoscopy today showed no active bleeding. There was small nonbleeding erosion in the ascending colon as well as evidence of recent bleeding from sigmoid colon diverticulum at 35-40 cm. It also showed large internal hemorrhoids.  Recommendations ---------------------- - Start full liquid diet and advance as tolerated - ok resume anticoagulation from GI standpoint. She remains at high risk for recurrent GI bleed.  D/W Dr. Tyrell Antonio.  - Monitor H&H. - GI will follow  Otis Brace MD, FACP 12/03/2017, 12:50 PM  Contact #  [phone]

## 2017-12-03 NOTE — Op Note (Addendum)
[facility] Patient Name: Amy [name] Procedure Date : 12/03/2017 MRN: [id number] Attending MD: Otis Brace , MD Date of Birth: Aug 09, 1945 CSN: [id number] Age: 72 Admit Type: Inpatient Procedure:                Colonoscopy Indications:              Rectal bleeding Providers:                Otis Brace, MD, Kingsley Plan, RN, Elspeth Cho Tech., Technician, Neldon Newport                            CRNA, CRNA Referring MD:              Medicines:                Sedation Administered by an Anesthesia Professional Complications:            No immediate complications. Estimated Blood Loss:     Estimated blood loss was minimal. Procedure:                Pre-Anesthesia Assessment:                           - Prior to the procedure, a History and Physical                            was performed, and patient medications and                            allergies were reviewed. The patient's tolerance of                            previous anesthesia was also reviewed. The risks                            and benefits of the procedure and the sedation                            options and risks were discussed with the patient.                            All questions were answered, and informed consent                            was obtained. Prior Anticoagulants: The patient has                            taken Xarelto (rivaroxaban), last dose was 4 days                            prior to procedure. ASA Grade Assessment: IV - A  patient with severe systemic disease that is a                            constant threat to life. After reviewing the risks                            and benefits, the patient was deemed in                            satisfactory condition to undergo the procedure.                           After obtaining informed consent, the colonoscope                            was  passed under direct vision. Throughout the                            procedure, the patient's blood pressure, pulse, and                            oxygen saturations were monitored continuously. The                            [id number]-[id number] ([id number]) peds colon was introduced                            through the anus and advanced to the the terminal                            ileum, with identification of the appendiceal                            orifice and IC valve. The colonoscopy was performed                            without difficulty. The patient tolerated the                            procedure well. The quality of the bowel                            preparation was good. Scope In: 12:10:10 PM Scope Out: 12:26:44 PM Scope Withdrawal Time: 0 hours 10 minutes 13 seconds  Total Procedure Duration: 0 hours 16 minutes 34 seconds  Findings:      The perianal and digital rectal examinations were normal.      The terminal ileum appeared normal.      There is no endoscopic evidence of bleeding in the entire colon.      One localized non-bleeding erosion was found in the proximal ascending       colon.      The ileocecal valve was mildly lipomatous. Biopsies were taken with a       cold forceps for histology.      Multiple  large-mouthed diverticula were found in the entire colon. There       was evidence of recent bleeding from the diverticular opening at 35?40       centimeters in the sigmoid colon.      Internal hemorrhoids were found during retroflexion. The hemorrhoids       were large. Impression:               - The examined portion of the ileum was normal.                           - One erosion in the proximal ascending colon.                           - Lipomatous ileocecal valve. Biopsied.                           - Diverticulosis in the entire examined colon.                            There was evidence of recent bleeding from the                             diverticular opening.                           - Internal hemorrhoids. Recommendation:           - Return patient to hospital ward for ongoing care.                           - Soft diet.                           - Continue present medications.                           - Await pathology results.                           - Resume Xarelto (rivaroxaban) at prior dose today. Procedure Code(s):        --- Professional ---                           [phone], Colonoscopy, flexible; with biopsy, single                            or multiple Diagnosis Code(s):        --- Professional ---                           K64.8, Other hemorrhoids                           K57.31, Diverticulosis of large intestine without                            perforation or abscess with bleeding  K63.89, Other specified diseases of intestine                           K62.5, Hemorrhage of anus and rectum CPT copyright 2017 American Medical Association. All rights reserved. The codes documented in this report are preliminary and upon coder review may  be revised to meet current compliance requirements. Otis Brace, MD Otis Brace, MD 12/03/2017 12:41:42 PM Number of Addenda: 0

## 2017-12-04 ENCOUNTER — Encounter (HOSPITAL_COMMUNITY): Payer: Self-pay | Admitting: Gastroenterology

## 2017-12-04 DIAGNOSIS — J9611 Chronic respiratory failure with hypoxia: Secondary | ICD-10-CM

## 2017-12-04 LAB — CBC
HCT: 22.9 % — ABNORMAL LOW (ref 36.0–46.0)
Hemoglobin: 7.4 g/dL — ABNORMAL LOW (ref 12.0–15.0)
MCH: 31 pg (ref 26.0–34.0)
MCHC: 32.3 g/dL (ref 30.0–36.0)
MCV: 95.8 fL (ref 78.0–100.0)
PLATELETS: 181 10*3/uL (ref 150–400)
RBC: 2.39 MIL/uL — AB (ref 3.87–5.11)
RDW: 17.5 % — ABNORMAL HIGH (ref 11.5–15.5)
WBC: 10.6 10*3/uL — ABNORMAL HIGH (ref 4.0–10.5)

## 2017-12-04 LAB — IRON AND TIBC
IRON: 382 ug/dL — AB (ref 28–170)
Saturation Ratios: 152 % — ABNORMAL HIGH (ref 10.4–31.8)
TIBC: 251 ug/dL (ref 250–450)

## 2017-12-04 LAB — BASIC METABOLIC PANEL
Anion gap: 4 — ABNORMAL LOW (ref 5–15)
BUN: 7 mg/dL — AB (ref 8–23)
CALCIUM: 8.2 mg/dL — AB (ref 8.9–10.3)
CHLORIDE: 113 mmol/L — AB (ref 98–111)
CO2: 20 mmol/L — AB (ref 22–32)
CREATININE: 1.09 mg/dL — AB (ref 0.44–1.00)
GFR calc Af Amer: 57 mL/min — ABNORMAL LOW (ref >60)
GFR calc non Af Amer: 49 mL/min — ABNORMAL LOW (ref >60)
GLUCOSE: 115 mg/dL — AB (ref 70–99)
Potassium: 4.3 mmol/L (ref 3.5–5.1)
Sodium: 137 mmol/L (ref 135–145)

## 2017-12-04 LAB — FERRITIN: Ferritin: 205 ng/mL (ref 11–307)

## 2017-12-04 MED ORDER — HYPROMELLOSE (GONIOSCOPIC) 2.5 % OP SOLN
2.0000 [drp] | OPHTHALMIC | Status: DC | PRN
  Administered 2017-12-04: 2 [drp] via OPHTHALMIC
  Filled 2017-12-04: qty 15

## 2017-12-04 MED ORDER — POLYVINYL ALCOHOL 1.4 % OP SOLN: 2.0000 [drp] | OPHTHALMIC | Status: DC | PRN

## 2017-12-04 NOTE — Progress Notes (Signed)
Patient refused CPAP at this time states the mask is too loud and does not want to sleep with the machine this evening. No distress noted RCP will continue to follow.

## 2017-12-04 NOTE — Progress Notes (Signed)
[facility] Progress Note  Amy [name] 72 y.o. [birth year]  CC:  Rectal bleeding   Subjective: denied further bleeding episodes. Hemoglobin stable. Denies abdominal pain, nausea vomiting.    Objective: Vital signs in last 24 hours: Vitals:   12/04/17 0744 12/04/17 0746  BP: (!) 86/37   Pulse: 64   Resp:    Temp: 98.4 F (36.9 C)   SpO2: 98% 97%    Physical Exam:  Gen. Alert/03. Not in acute distress Abdomen. Soft, nontender, nondistended, bowel sounds present. Psych : Mood and affect normal   Lab Results: Recent Labs    12/03/17 0316 12/04/17 0400  NA 139 137  K 4.3 4.3  CL 113* 113*  CO2 19* 20*  GLUCOSE 108* 115*  BUN 12 7*  CREATININE 1.05* 1.09*  CALCIUM 8.3* 8.2*   No results for input(s): AST, ALT, ALKPHOS, BILITOT, PROT, ALBUMIN in the last 72 hours. Recent Labs    12/03/17 0316 12/04/17 0400  WBC 11.7* 10.6*  HGB 7.6* 7.4*  HCT 23.7* 22.9*  MCV 94.4 95.8  PLT 202 181   No results for input(s): LABPROT, INR in the last 72 hours.    Assessment/Plan: - painless rectal bleeding while on Xarelto..most likely hemorrhoidal vs  diverticular in nature.Colonoscopy yesterday no active bleeding. There was small nonbleeding erosion in the ascending colon as well as evidence of recent bleeding from sigmoid colon diverticulum at 35-40 cm. It also showed large internal hemorrhoids.   - acute blood loss anemia. Hemoglobin stable - Bloody diarrhea.- C. Difficile and GI pathogen panel negative. -  atrial fibrillation. Last dose of Xarelto  08/24  - History of pulmonary hypertension. - History of atypical cardiomyopathy. 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 Negative virtual colonoscopy last year. - mildly elevated troponins. Patient denies any new chest pain. - Jehovah's Witness  Recommendations ------------------------- - no further bleeding episodes. - Advance diet - Patient would like to hold off on anticoagulation at least  for 1 week. - Okay to resume anticoagulation from GI standpoint. She remains at high risk for rebleeding which was discussed with the patient. - status post IV iron on August 28 per primary team  - No further inpatient GI workup planned. GI will sign off. Call us back needed   Otis Brace MD, [place] 12/04/2017, 12:46 PM  Contact #  [phone]

## 2017-12-04 NOTE — Progress Notes (Addendum)
Advanced Heart Failure Rounding Note  PCP-Cardiologist: No primary care provider on file.   Subjective:    Colonoscopy 8/29: No active bleeding. There was small nonbleeding erosion in the ascending colon as well as evidence of recent bleeding from sigmoid colon diverticulum at 35-40 cm. It also showed large internal hemorrhoids.  Per GI okay to resume AC, but at high risk for recurrent GIB.   Received IV iron yesterday. Hemoglobin trending down 7.4. She had a run of Afib vs SVT 110s overnight.   Weight down 1 lb. Off lasix. Creatinine stable.   Denies further bleeding, SOB, or CP. Having some dizziness when moving around. SBP 86-90s this morning. She does not want to restart Xarelto right now, but is agreeable to restarting in 1 week if hemoglobin remains stable.   Objective:   Weight Range: 86.4 kg Body mass index is 33.75 kg/m.   Vital Signs:   Temp:  [97.9 F (36.6 C)-98.7 F (37.1 C)] 98.4 F (36.9 C) (08/30 0744) Pulse Rate:  [63-71] 64 (08/30 0744) Resp:  [12-20] 19 (08/30 0551) BP: (86-120)/(37-58) 86/37 (08/30 0744) SpO2:  [94 %-100 %] 97 % (08/30 0746) Weight:  [86.4 kg] 86.4 kg (08/30 0551) Last BM Date: 12/03/17  Weight change: Filed Weights   12/02/17 0642 12/03/17 0530 12/04/17 0551  Weight: 85.4 kg 87 kg 86.4 kg    Intake/Output:   Intake/Output Summary (Last 24 hours) at 12/04/2017 0820 Last data filed at 12/03/2017 1500 Gross per 24 hour  Intake 415.7 ml  Output 0 ml  Net 415.7 ml      Physical Exam    General: Well appearing. No resp difficulty. HEENT: Normal Neck: Supple. JVP flat. Carotids 2+ bilat; no bruits. No thyromegaly or nodule noted. Cor: PMI nondisplaced. RRR, No M/G/R noted Lungs: CTAB, normal effort. Abdomen: Soft, non-tender, non-distended, no HSM. No bruits or masses. +BS  Extremities: No cyanosis, clubbing, or rash. R and LLE trace edema. BLE TED hose on Neuro: Alert & orientedx3, cranial nerves grossly intact. moves  all 4 extremities w/o difficulty. Affect pleasant   Telemetry   NSR 60-70s. One short run of Afib vs SVT 110s Personally reviewed.   EKG   No new tracings.   Labs    CBC Recent Labs    12/03/17 0316 12/04/17 0400  WBC 11.7* 10.6*  HGB 7.6* 7.4*  HCT 23.7* 22.9*  MCV 94.4 95.8  PLT 202 767   Basic Metabolic Panel Recent Labs    12/03/17 0316 12/04/17 0400  NA 139 137  K 4.3 4.3  CL 113* 113*  CO2 19* 20*  GLUCOSE 108* 115*  BUN 12 7*  CREATININE 1.05* 1.09*  CALCIUM 8.3* 8.2*   Liver Function Tests No results for input(s): AST, ALT, ALKPHOS, BILITOT, PROT, ALBUMIN in the last 72 hours. No results for input(s): LIPASE, AMYLASE in the last 72 hours. Cardiac Enzymes No results for input(s): CKTOTAL, CKMB, CKMBINDEX, TROPONINI in the last 72 hours.  BNP: BNP (last 3 results) Recent Labs    12/11/16 1356 06/02/17 1013 10/06/17 0925  BNP 148.9* 126.0* 675.7*    ProBNP (last 3 results) No results for input(s): PROBNP in the last 8760 hours.   D-Dimer No results for input(s): DDIMER in the last 72 hours. Hemoglobin A1C No results for input(s): HGBA1C in the last 72 hours. Fasting Lipid Panel No results for input(s): CHOL, HDL, LDLCALC, TRIG, CHOLHDL, LDLDIRECT in the last 72 hours. Thyroid Function Tests No results for input(s): TSH,  T4TOTAL, T3FREE, THYROIDAB in the last 72 hours.  Invalid input(s): FREET3  Other results:   Imaging    No results found.   Medications:     Scheduled Medications: . atorvastatin  80 mg Oral Q lunch  . colchicine  0.6 mg Oral Daily  . dronedarone  400 mg Oral BID WC  . ezetimibe  10 mg Oral Daily  . folic acid  1 mg Oral Daily  . ipratropium-albuterol  3 mL Nebulization TID  . levothyroxine  25 mcg Oral QAC breakfast  . macitentan  10 mg Oral Daily  . predniSONE  10 mg Oral BID WC  . Selexipag  1,600 mcg Oral BID  . vitamin B-12  100 mcg Oral Daily    Infusions: . 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  Admitted with GI bleed.   Assessment/Plan   1.  GI bleed  Jehovahs witness. No blood products.  Xarelto stopped on admit. Will need to eventually restart.  - GI consulted. Flex Sigmoidoscopy with old blood and diverticulosis.  - Tagged RBC negative.  - Colonoscopy with no active bleeding. There was small nonbleeding erosion in the ascending colon as well as evidence of recent bleeding from sigmoid colon diverticulum at 35-40 cm. It also showed large internal hemorrhoids. - Hgb continues to drift down from 12.7-->7.7 -> 7.6 -> 7.4.  - Per GI, okay to resume AC, but remains high risk for GIB.  - She does not want to restart xarelto today, but is agreeable to restarting in 1 week if hemoglobin remains stable. Discussed with Dr Aundra Dubin  2. 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 Followed by Dr Lake Bells and Dr Aundra Dubin. Refuses cellcept given 3% risk of gastric hemorrhage. VQ scan was negative 2018.  - Intolerant adcirca - On opsumit and selexipag.  - Wears 4 L O2 at home. Continue for now.   3. PAF - Remains in NSR. - on multaq 400 mg daily.  - at home she takes metoprolol as needed.  - Had A fib ablation 2017  - No amio with history of suspected amio toxicity  - Off anticoagulants with GI bleed. She does not want to restart Xarelto today, but is agreeable to restarting in 1 week.  - Continue ted hose and SCDs.   4. Chronic Diastolic HF Most recent ECHO 09/2017 EF 65-70%. Grade III DD  Peak PA pressure 42 mm hg  - Volume status stable to dry.  - Hold off on restarting lasix today with soft BPs and dizziness. On 20 mg daily at home.   5. Chronic Hypoxic Respiratory Failure Continue 4 liters (home dose) oxygen. O2 sats stable.   Addendum: Pt does not want to restart xarelto today, but is agreeable to starting in 1 week if hemoglobin remains stable. Discussed with Dr Aundra Dubin.   Medication concerns reviewed with patient and pharmacy team. Barriers identified: none. Receives PAH meds from specialty pharmacy.   Length of Stay: [place], NP  12/04/2017, 8:20 AM  Advanced Heart Failure Team Pager [phone] (M-F; 7a - 4p)  Please contact [facility] for night-coverage after hours (4p -7a ) and weekends on [email or web address]  Patient seen with NP, agree with the above note.  Colonoscopy yesterday with possible sources of LBIB = large internal hemorrhoids and diverticulosis.  No further overt bleeding, hgb stable.  - Will check Fe studies, give IV Fe if low.  - Hold anticoagulation x 1 week, given GI bleeding would suggest restarting her on Eliquis 5 mg bid.   Volume status ok, SBP in 90s.  Not lightheaded.  Can hold off on Lasix for now, would restart 20 mg daily when she goes home.   She is going to stay off Cellcept for now.  She is concerned that it triggered her bleeding.  Will need to discuss options with Dr. Lake Bells.   [name] 12/04/2017 1:05 PM

## 2017-12-04 NOTE — Progress Notes (Signed)
PROGRESS NOTE    FLORENCIA ZACCARO  UEA:[id number] DOB: [birth year] DOA: 11/29/2017 PCP: Aretta Nip, MD    Brief Narrative: Amy Core Whren-Nobleis a 72 y.o.femalewith medical history significant forparoxysmal atrial fibrillation on Xarelto, hypertension, hyperlipidemia, hypothyroidism diastolic heart failure, CAD, with catheterization in October 07, 2017, nonobstructive CAD medical management, OSA on CPAP, as well as apical H COM by cardiac MRI, with mild to moderate pulmonary hypertension on chronic O2 4L [place], on CellCept, OPSUMIT Uptravi, followed by cardiology and pulmonology,presenting to the emergency department with bright red blood per rectum.  Gastroenterology was consulted.  Assessment & Plan:   Active Problems:   Asthmatic bronchitis , chronic (HCC)   Hypothyroidism   Essential hypertension   Apical variant hypertrophic cardiomyopathy (HCC)   Chronic diastolic heart failure (HCC)   OSA (obstructive sleep apnea)   CAD (coronary artery disease)   PAF (paroxysmal atrial fibrillation) (HCC)   Chronic respiratory failure with hypoxia (HCC)   GIB (gastrointestinal bleeding)   1-Acute lower GI bleed;  Transfer to step down 8-26 due to hypotension and persistent GI bleed.  GI consulted and following, S/P Flex sig on 8-26 unrevealing.  Nuclear scan did not showed source of bleeding.  Evaluated by cardiology high risk for procedure, but not prohibited.  Received IV iron 8-28 Limit blood drawn, started  B 12 and folic acid.  Hb stable today at 7.6. Per nurse stool more brownish.  Colonoscopy today. Oxygen sat stable on chronic 4 L.  Plan to hold anticoagulation for one week.  Hb stable.    Chronic diastolic CHF / Pulmonary hypertension / Chronic hypoxic respiratory failure On chronic 4 L oxygen.  Holding lasix due to GI bleed.  Stable.   CAD; cath July 2019, non obstructive CAD  H COM by cardiac MRI, with mild to moderate pulmonary hypertension, P-ANCA+, CCP+,  RO-52+ Holding Cellcept.    PAF hold xarelto.   CKD 3 Stable.   HLD;  On statins.   Hypothyroidism;  Continue with synthroid.   Transient numbness finger;  Monitor, rest neuro exam unremarkable.  Resolved./   DVT prophylaxis: scd Code Status:  Full code.  Family Communication: care discussed with patient.  Disposition Plan: remain in step down unit. If patient develops hypotension will need transfer to ICU.   Consultants:   GI  Cardiology    Procedures:   Flex    Antimicrobials:  none   Subjective: Denies dyspnea.     Objective: Vitals:   12/03/17 2350 12/04/17 0551 12/04/17 0744 12/04/17 0746  BP: (!) 102/54 (!) 92/45 (!) 86/37   Pulse: 63 68 64   Resp: 12 19    Temp: 98.4 F (36.9 C) 98.1 F (36.7 C) 98.4 F (36.9 C)   TempSrc:  Oral Oral   SpO2: 100% 95% 98% 97%  Weight:  86.4 kg    Height:        Intake/Output Summary (Last 24 hours) at 12/04/2017 0914 Last data filed at 12/03/2017 1500 Gross per 24 hour  Intake 415.7 ml  Output 0 ml  Net 415.7 ml   Filed Weights   12/02/17 0642 12/03/17 0530 12/04/17 0551  Weight: 85.4 kg 87 kg 86.4 kg    Examination:  General exam: NAD Respiratory system: CTA Cardiovascular system: S 1, S 2 RRR Gastrointestinal system: BS present, soft,nt Central nervous system: alert, non focal.  Extremities: Symmetric power.  Skin: No rashes    Data Reviewed: I have personally reviewed following labs and imaging studies  CBC: Recent Labs  Lab 11/29/17 0550  12/01/17 1520 12/01/17 2356 12/02/17 0444 12/03/17 0316 12/04/17 0400  WBC 10.3   < > 13.7* 13.8* 11.8* 11.7* 10.6*  NEUTROABS 7.6  --   --   --   --   --   --   HGB 12.7   < > 8.6* 8.6* 7.7* 7.6* 7.4*  HCT 41.0   < > 27.1* 26.9* 23.7* 23.7* 22.9*  MCV 96.5   < > 94.1 94.7 93.3 94.4 95.8  PLT 273   < > 210 198 197 202 181   < > = values in this interval not displayed.   Basic Metabolic Panel: Recent Labs  Lab 11/29/17 0550  11/30/17 0413 12/02/17 0444 12/03/17 0316 12/04/17 0400  NA 139 138 136 139 137  K 3.9 4.4 4.1 4.3 4.3  CL 112* 114* 113* 113* 113*  CO2 16* 18* 19* 19* 20*  GLUCOSE 69* 87 103* 108* 115*  BUN 32* 29* 19 12 7*  CREATININE 1.30* 1.19* 1.04* 1.05* 1.09*  CALCIUM 8.1* 8.1* 8.0* 8.3* 8.2*   GFR: Estimated Creatinine Clearance: 48.6 mL/min (A) (by C-G formula based on SCr of 1.09 mg/dL (H)). Liver Function Tests: Recent Labs  Lab 11/29/17 0550 11/30/17 0413  AST 26 22  ALT 42 40  ALKPHOS 48 46  BILITOT 0.7 0.9  PROT 5.6* 5.3*  ALBUMIN 3.1* 2.9*   Recent Labs  Lab 11/29/17 0550  LIPASE 46   No results for input(s): AMMONIA in the last 168 hours. Coagulation Profile: Recent Labs  Lab 11/29/17 0550  INR 2.09   Cardiac Enzymes: Recent Labs  Lab 11/29/17 0550 11/29/17 1209 11/29/17 1553  TROPONINI 0.11* 0.15* 0.16*   BNP (last 3 results) No results for input(s): PROBNP in the last 8760 hours. HbA1C: No results for input(s): HGBA1C in the last 72 hours. CBG: Recent Labs  Lab 11/30/17 1129  GLUCAP 78   Lipid Profile: No results for input(s): CHOL, HDL, LDLCALC, TRIG, CHOLHDL, LDLDIRECT in the last 72 hours. Thyroid Function Tests: No results for input(s): TSH, T4TOTAL, FREET4, T3FREE, THYROIDAB in the last 72 hours. Anemia Panel: No results for input(s): VITAMINB12, FOLATE, FERRITIN, TIBC, IRON, RETICCTPCT in the last 72 hours. Sepsis Labs: No results for input(s): PROCALCITON, LATICACIDVEN in the last 168 hours.  Recent Results (from the past 240 hour(s))  C difficile quick scan w PCR reflex     Status: None   Collection Time: 11/29/17  8:12 AM  Result Value Ref Range Status   C Diff antigen NEGATIVE NEGATIVE Final   C Diff toxin NEGATIVE NEGATIVE Final   C Diff interpretation No C. difficile detected.  Final    Comment: Performed at [facility], [place] [address], [place], [place] [address]  Gastrointestinal Panel by PCR , Stool     Status: None    Collection Time: 11/29/17  8:12 AM  Result Value Ref Range Status   Campylobacter species NOT DETECTED NOT DETECTED Final   Plesimonas shigelloides NOT DETECTED NOT DETECTED Final   Salmonella species NOT DETECTED NOT DETECTED Final   Yersinia enterocolitica NOT DETECTED NOT DETECTED Final   Vibrio species NOT DETECTED NOT DETECTED Final   Vibrio cholerae NOT DETECTED NOT DETECTED Final   Enteroaggregative E coli (EAEC) NOT DETECTED NOT DETECTED Final   Enteropathogenic E coli (EPEC) NOT DETECTED NOT DETECTED Final   Enterotoxigenic E coli (ETEC) NOT DETECTED NOT DETECTED Final   Shiga like toxin producing E coli (STEC)  NOT DETECTED NOT DETECTED Final   Shigella/Enteroinvasive E coli (EIEC) NOT DETECTED NOT DETECTED Final   Cryptosporidium NOT DETECTED NOT DETECTED Final   Cyclospora cayetanensis NOT DETECTED NOT DETECTED Final   Entamoeba histolytica NOT DETECTED NOT DETECTED Final   Giardia lamblia NOT DETECTED NOT DETECTED Final   Adenovirus F40/41 NOT DETECTED NOT DETECTED Final   Astrovirus NOT DETECTED NOT DETECTED Final   Norovirus GI/GII NOT DETECTED NOT DETECTED Final   Rotavirus A NOT DETECTED NOT DETECTED Final   Sapovirus (I, II, IV, and V) NOT DETECTED NOT DETECTED Final    Comment: Performed at [facility], [address] [place], [place] [address]  MRSA PCR Screening     Status: None   Collection Time: 11/30/17 12:23 PM  Result Value Ref Range Status   MRSA by PCR NEGATIVE NEGATIVE Final    Comment:        The GeneXpert MRSA Assay (FDA approved for NASAL specimens only), is one component of a comprehensive MRSA colonization surveillance program. It is not intended to diagnose MRSA infection nor to guide or monitor treatment for MRSA infections. Performed at [facility], [place] [address], [place], [place] [address]          Radiology Studies: No results found.      Scheduled Meds: . atorvastatin  80 mg Oral Q lunch  . colchicine   0.6 mg Oral Daily  . dronedarone  400 mg Oral BID WC  . ezetimibe  10 mg Oral Daily  . folic acid  1 mg Oral Daily  . ipratropium-albuterol  3 mL Nebulization TID  . levothyroxine  25 mcg Oral QAC breakfast  . macitentan  10 mg Oral Daily  . predniSONE  10 mg Oral BID WC  . Selexipag  1,600 mcg Oral BID  . vitamin B-12  100 mcg Oral Daily   Continuous Infusions: . famotidine (PEPCID) IV 20 mg (12/04/17 0840)     LOS: 4 days    Time spent: 35 minutes.     Elmarie Shiley, MD [facility] Pager [phone]  If 7PM-7AM, please contact night-coverage [email or web address] Password [facility] 12/04/2017, 9:14 AM

## 2017-12-05 ENCOUNTER — Inpatient Hospital Stay (HOSPITAL_COMMUNITY): Payer: Medicare Other

## 2017-12-05 LAB — BASIC METABOLIC PANEL
Anion gap: 4 — ABNORMAL LOW (ref 5–15)
BUN: 9 mg/dL (ref 8–23)
CO2: 21 mmol/L — ABNORMAL LOW (ref 22–32)
CREATININE: 1.31 mg/dL — AB (ref 0.44–1.00)
Calcium: 8.1 mg/dL — ABNORMAL LOW (ref 8.9–10.3)
Chloride: 114 mmol/L — ABNORMAL HIGH (ref 98–111)
GFR calc Af Amer: 46 mL/min — ABNORMAL LOW (ref >60)
GFR, EST NON AFRICAN AMERICAN: 40 mL/min — AB (ref >60)
GLUCOSE: 102 mg/dL — AB (ref 70–99)
POTASSIUM: 5.3 mmol/L — AB (ref 3.5–5.1)
Sodium: 139 mmol/L (ref 135–145)

## 2017-12-05 LAB — CBC
HEMATOCRIT: 23.2 % — AB (ref 36.0–46.0)
Hemoglobin: 7.3 g/dL — ABNORMAL LOW (ref 12.0–15.0)
MCH: 30.7 pg (ref 26.0–34.0)
MCHC: 31.5 g/dL (ref 30.0–36.0)
MCV: 97.5 fL (ref 78.0–100.0)
Platelets: 194 10*3/uL (ref 150–400)
RBC: 2.38 MIL/uL — ABNORMAL LOW (ref 3.87–5.11)
RDW: 18.6 % — AB (ref 11.5–15.5)
WBC: 11.8 10*3/uL — ABNORMAL HIGH (ref 4.0–10.5)

## 2017-12-05 LAB — GLUCOSE, CAPILLARY: GLUCOSE-CAPILLARY: 89 mg/dL (ref 70–99)

## 2017-12-05 MED ORDER — FERROUS SULFATE 325 (65 FE) MG PO TABS: 325.0000 mg | ORAL_TABLET | Freq: Two times a day (BID) | ORAL | Status: DC

## 2017-12-05 MED ORDER — FERROUS SULFATE 325 (65 FE) MG PO TABS
325.0000 mg | ORAL_TABLET | Freq: Two times a day (BID) | ORAL | Status: DC
  Administered 2017-12-05 – 2017-12-08 (×6): 325 mg via ORAL
  Filled 2017-12-05 (×6): qty 1

## 2017-12-05 MED ORDER — HEPARIN (PORCINE) IN NACL 100-0.45 UNIT/ML-% IJ SOLN
600.0000 [IU]/h | INTRAMUSCULAR | Status: AC
  Administered 2017-12-05: 850 [IU]/h via INTRAVENOUS
  Administered 2017-12-07: 600 [IU]/h via INTRAVENOUS
  Filled 2017-12-05 (×2): qty 250

## 2017-12-05 NOTE — Evaluation (Signed)
Clinical/Bedside Swallow Evaluation Patient Details  Name: Amy [name] MRN: [id number] Date of Birth: 1946/01/08  Today's Date: 12/05/2017 Time: SLP Start Time (ACUTE ONLY): 1528 SLP Stop Time (ACUTE ONLY): 1540 SLP Time Calculation (min) (ACUTE ONLY): 12 min  Past Medical History:  Past Medical History:  Diagnosis Date  . Apical variant hypertrophic cardiomyopathy ([place])    Diagnosed by echo and ECG 11/13  . Atypical atrial flutter ([place])   . CAD (coronary artery disease)    a. LHC in 10/2011 demonstrated non-obstructive disease and an 80% lesion in a small D1 treated medically.   . CHF (congestive heart failure) ([place])   . Chronic diastolic heart failure ([place])   . CKD (chronic kidney disease), stage III ([place])   . COPD (chronic obstructive pulmonary disease) ([place])   . Dizziness    had PT for being off balance - in approx. 2012  . GERD (gastroesophageal reflux disease)   . Hyperlipidemia   . Hypertension   . Hypothyroid   . Lumbar disc disease   . Macular degeneration   . OSA (obstructive sleep apnea)   . Persistent atrial fibrillation ([place])       . Pulmonary hypertension ([place])    a. [place] 05/2013 showed mild PAH with normal PCWP and RA pressure, could be related to OSA and low oxygen saturation.   . Refusal of blood transfusions as patient is Jehovah's Witness   . Sinus bradycardia    Past Surgical History:  Past Surgical History:  Procedure Laterality Date  . BIOPSY  12/03/2017   Procedure: BIOPSY;  Surgeon: Otis Brace, MD;  [facility];  Service: Gastroenterology;;  . CARDIAC CATHETERIZATION     normal coronary arteries  . CARDIAC CATHETERIZATION N/A 10/31/2014   Procedure: Left Heart Cath and Coronary Angiography;  Surgeon: Leonie Man, MD;  Location: [place] [facility];  Service: Cardiovascular;  Laterality: N/A;  . CATARACT EXTRACTION Bilateral   . CESAREAN SECTION  1983  . COLONOSCOPY WITH PROPOFOL N/A 12/03/2017   Procedure: COLONOSCOPY  WITH PROPOFOL;  Surgeon: Otis Brace, MD;  Location: [place];  Service: Gastroenterology;  Laterality: N/A;  . ELECTROPHYSIOLOGIC STUDY N/A 04/24/2015   Procedure: Atrial Fibrillation Ablation;  Surgeon: [name], MD;  Location: [place] [facility];  Service: Cardiovascular;  Laterality: N/A;  . FLEXIBLE SIGMOIDOSCOPY N/A 11/30/2017   Procedure: FLEXIBLE SIGMOIDOSCOPY;  Surgeon: Otis Brace, MD;  Location: [place];  Service: Gastroenterology;  Laterality: N/A;  . RIGHT HEART CATH N/A 06/25/2016   Procedure: Right Heart Cath;  Surgeon: Sherren Mocha, MD;  Location: [place] [facility];  Service: Cardiovascular;  Laterality: N/A;  . RIGHT HEART CATH N/A 06/10/2017   Procedure: RIGHT HEART CATH;  Surgeon: Larey Dresser, MD;  Location: [place] [facility];  Service: Cardiovascular;  Laterality: N/A;  . RIGHT HEART CATHETERIZATION N/A 06/01/2013   Procedure: RIGHT HEART CATH;  Surgeon: Larey Dresser, MD;  Location: [facility] CATH LAB;  Service: Cardiovascular;  Laterality: N/A;  . TEE WITHOUT CARDIOVERSION N/A 04/23/2015   Procedure: TRANSESOPHAGEAL ECHOCARDIOGRAM (TEE);  Surgeon: Josue Hector, MD;  Location: [facility] ENDOSCOPY;  Service: Cardiovascular;  Laterality: N/A;   HPI:  Amy [name] is a 72 y.o. female with medical history significant for paroxysmal atrial fibrillation on Xarelto, GERD, hypertension, hyperlipidemia, hypothyroidism diastolic heart failure, CAD, with catheterization in October 07, 2017, nonobstructive CAD medical management, OSA on CPAP, as well as apical H COM by cardiac MRI, with mild to moderate pulmonary hypertension  on chronic O2 4L [place], on CellCept, OPSUMIT Uptravi, followed by cardiology and pulmonology, presenting to the emergency department with bright red blood per rectum; per GI most likely hemorrhoidal, now resolved and recommended advance diet. Swallow evaluation ordered.  Assessment / Plan / Recommendation Clinical Impression  Patient presents with  oropharyngeal swallow which appears at bedside to be within functional limits with adequate airway protection. No overt signs of aspiration observed despite challenging with consecutive straw sips of thin liquids in excess of 3oz. Oral motor examination reveals adequate strength and ROM bilaterally, no focal cranial nerve deficits noted. Recommend regular diet with thin liquids, no further skilled ST needs identified. Will s/o for dysphagia. At time of filing, new MD progress note indicates concern for expressive aphasia; CT and MRI are pending. No obvious wordfinding impairments noted at time of assessment, however daughter reports 2 transient episodes today. If pt found to have acute CVA or MD has concerns for cognitive-linguistic impairment, please order SLP cognitive-linguistic evaluation.   SLP Visit Diagnosis: Dysphagia, unspecified (R13.10)    Aspiration Risk  Mild aspiration risk    Diet Recommendation Regular;Thin liquid   Liquid Administration via: Cup;Straw Medication Administration: Whole meds with liquid Supervision: Patient able to self feed Compensations: Slow rate;Small sips/bites Postural Changes: Seated upright at 90 degrees    Other  Recommendations Oral Care Recommendations: Oral care BID   Follow up Recommendations None      Frequency and Duration            Prognosis Prognosis for Safe Diet Advancement: Good      Swallow Study   General Date of Onset: 11/29/17 HPI: Amy [name] is a 72 y.o. female with medical history significant for paroxysmal atrial fibrillation on Xarelto, GERD, hypertension, hyperlipidemia, hypothyroidism diastolic heart failure, CAD, with catheterization in October 07, 2017, nonobstructive CAD medical management, OSA on CPAP, as well as apical H COM by cardiac MRI, with mild to moderate pulmonary hypertension on chronic O2 4L [place], on CellCept, OPSUMIT Uptravi, followed by cardiology and pulmonology, presenting to the emergency department  with bright red blood per rectum; per GI most likely hemorrhoidal, now resolved and recommended advance diet. Type of Study: Bedside Swallow Evaluation Previous Swallow Assessment: see HPI Diet Prior to this Study: Regular;Thin liquids Temperature Spikes Noted: No Respiratory Status: Nasal cannula History of Recent Intubation: No Behavior/Cognition: Alert;Cooperative;Pleasant mood Oral Cavity Assessment: Within Functional Limits Oral Care Completed by SLP: No Oral Cavity - Dentition: Adequate natural dentition Vision: Functional for self-feeding Self-Feeding Abilities: Able to feed self Patient Positioning: (upright edge of bed) Baseline Vocal Quality: Normal Volitional Cough: Strong Volitional Swallow: Able to elicit    Oral/Motor/Sensory Function Overall Oral Motor/Sensory Function: Within functional limits   Ice Chips Ice chips: Not tested   Thin Liquid Thin Liquid: Within functional limits Presentation: Straw;Self Fed    Nectar Thick Nectar Thick Liquid: Not tested   Honey Thick Honey Thick Liquid: Not tested   Puree Puree: Within functional limits Presentation: Self Fed;Spoon   Solid     Solid: Within functional limits Presentation: [place], [place], [place] Pathologist Acute Rehabilitation Services Pager: [phone] Office: [phone]  Aliene Altes 12/05/2017,4:07 PM

## 2017-12-05 NOTE — Consult Note (Addendum)
NEURO HOSPITALIST      Requesting Physician: Dr. Tyrell Antonio    Chief Complaint: transient aphasia  History obtained from:  Patient  / daughter  HPI:                                                                                                                                         Amy [name] is an 72 y.o. female  With PMH of paroxysmal a fib ( on xarelto) currently stopped d/t GI bleed., HTN, HLD, diastolic HHF, CAD with cath on 10/07/17, OSA on CPAP,  in place who came to [facility] initially for bright red blood in rectum.    Patient daughter stated that about 51 today she came to visit her mother and noticed that she was" not right". While assisting her mother from the bedside commode back to the bed she states that her mother was unable to answer questions, she could only shake her head yes and no, she appeared agitated and could not figure out how to properly get into the bed. Her mother's mentation began to gradually come back. The entire episode lasted about 30 minutes. Patient denies HA, vision changes, focal weakness, facial droop, CP, SOB. Patient is on oxygen at baseline. Patient endorses that she woke up this morning in her usual state of health, but at some point this morning when the MD's were visiting she had an episode similar to this. She states that she did not understand what the doctors were saying to her. She could also not find her words and it resolved. Had the second episode which is what her daughter witnessed.   ED course:  CT head: no acute infarct or hemorrhage. MRI: pending  No prior stroke history noted.    Date last known well: Date: 12/05/2017 Time last known well: Unable to determine tPA Given: No: contraindicated; recent  GI bleed  Modified Rankin: Rankin Score=1  NIHSS:0 1a Level of Conscious:0 1b LOC Questions: 0 1c LOC Commands: 0 2 Best Gaze: 0 3 Visual: 0 4 Facial Palsy: 0 5a Motor Arm -  left: 0 5b Motor Arm - Right: 0 6a Motor Leg - Left: 0 6b Motor Leg - Right: 0 7 Limb Ataxia: 0 8 Sensory: 0 9 Best Language: 0 10 Dysarthria:0 11 Extinct. and Inattention:0 TOTAL: 0   Past Medical History:  Diagnosis Date  . Apical variant hypertrophic cardiomyopathy ([place])    Diagnosed by echo and ECG 11/13  . Atypical atrial flutter ([place])   . CAD (coronary artery disease)    a. LHC in 10/2011 demonstrated non-obstructive disease and an 80% lesion in a small D1  treated medically.   . CHF (congestive heart failure) ([place])   . Chronic diastolic heart failure ([place])   . CKD (chronic kidney disease), stage III ([place])   . COPD (chronic obstructive pulmonary disease) ([place])   . Dizziness    had PT for being off balance - in approx. 2012  . GERD (gastroesophageal reflux disease)   . Hyperlipidemia   . Hypertension   . Hypothyroid   . Lumbar disc disease   . Macular degeneration   . OSA (obstructive sleep apnea)   . Persistent atrial fibrillation ([place])       . Pulmonary hypertension ([place])    a. [place] 05/2013 showed mild PAH with normal PCWP and RA pressure, could be related to OSA and low oxygen saturation.   . Refusal of blood transfusions as patient is Jehovah's Witness   . Sinus bradycardia     Past Surgical History:  Procedure Laterality Date  . BIOPSY  12/03/2017   Procedure: BIOPSY;  Surgeon: Otis Brace, MD;  [facility];  Service: Gastroenterology;;  . CARDIAC CATHETERIZATION     normal coronary arteries  . CARDIAC CATHETERIZATION N/A 10/31/2014   Procedure: Left Heart Cath and Coronary Angiography;  Surgeon: Leonie Man, MD;  Location: [place] [facility] LAB;  Service: Cardiovascular;  Laterality: N/A;  . CATARACT EXTRACTION Bilateral   . CESAREAN SECTION  1983  . COLONOSCOPY WITH PROPOFOL N/A 12/03/2017   Procedure: COLONOSCOPY WITH PROPOFOL;  Surgeon: Otis Brace, MD;  Location: [place];  Service: Gastroenterology;  Laterality: N/A;  .  ELECTROPHYSIOLOGIC STUDY N/A 04/24/2015   Procedure: Atrial Fibrillation Ablation;  Surgeon: [name], MD;  Location: [place] [facility] LAB;  Service: Cardiovascular;  Laterality: N/A;  . FLEXIBLE SIGMOIDOSCOPY N/A 11/30/2017   Procedure: FLEXIBLE SIGMOIDOSCOPY;  Surgeon: Otis Brace, MD;  Location: [place];  Service: Gastroenterology;  Laterality: N/A;  . RIGHT HEART CATH N/A 06/25/2016   Procedure: Right Heart Cath;  Surgeon: Sherren Mocha, MD;  Location: [place] [facility] LAB;  Service: Cardiovascular;  Laterality: N/A;  . RIGHT HEART CATH N/A 06/10/2017   Procedure: RIGHT HEART CATH;  Surgeon: Larey Dresser, MD;  Location: [place] [facility] LAB;  Service: Cardiovascular;  Laterality: N/A;  . RIGHT HEART CATHETERIZATION N/A 06/01/2013   Procedure: RIGHT HEART CATH;  Surgeon: Larey Dresser, MD;  Location: [facility] CATH LAB;  Service: Cardiovascular;  Laterality: N/A;  . TEE WITHOUT CARDIOVERSION N/A 04/23/2015   Procedure: TRANSESOPHAGEAL ECHOCARDIOGRAM (TEE);  Surgeon: Josue Hector, MD;  Location: [facility] ENDOSCOPY;  Service: Cardiovascular;  Laterality: N/A;    Family History  Problem Relation Age of Onset  . Heart disease Sister        Good Pastures Syndrome  . Gout Father   . Lung cancer Father        smoked  . Breast cancer Paternal Aunt          Social History:  reports that she quit smoking about 8 years ago. Her smoking use included cigarettes. She has a 22.00 pack-year smoking history. She has never used smokeless tobacco. She reports that she does not drink alcohol or use drugs.  Allergies:  Allergies  Allergen Reactions  . Amiodarone     Dyspnea - felt to be 2/2 amio lung toxicity Has tolerated Omnipaque   . Zyban [Bupropion] Itching    Medications:  Scheduled: . atorvastatin  80 mg Oral Q lunch  . colchicine  0.6 mg Oral Daily  . dronedarone  400 mg  Oral BID WC  . ezetimibe  10 mg Oral Daily  . folic acid  1 mg Oral Daily  . ipratropium-albuterol  3 mL Nebulization TID  . levothyroxine  25 mcg Oral QAC breakfast  . macitentan  10 mg Oral Daily  . predniSONE  10 mg Oral BID WC  . Selexipag  1,600 mcg Oral BID  . vitamin B-12  100 mcg Oral Daily   Continuous: . famotidine (PEPCID) IV 20 mg (12/05/17 0923)   WGN:FAOZHYQMVHQIO **OR** acetaminophen, bisacodyl, HYDROcodone-acetaminophen, hydroxypropyl methylcellulose / hypromellose, ondansetron **OR** ondansetron (ZOFRAN) IV, senna-docusate   ROS:                                                                                                                                       History obtained from the patient  General ROS: negative for - chills, fatigue, fever, night sweats, weight gain or weight loss Ophthalmic ROS: negative for - blurry vision, double vision, eye pain or loss of vision Respiratory ROS: negative for - cough,  shortness of breath or wheezing Cardiovascular ROS: negative for - chest pain, dyspnea on exertion,  Musculoskeletal ROS: negative for - joint swelling or muscular weakness Neurological ROS: as noted in HPI   General Examination:                                                                                                      Blood pressure (!) 91/47, pulse 67, temperature 98.7 F (37.1 C), temperature source Oral, resp. rate 17, height _0  (1.6 m), weight 86.7 kg, SpO2 100 %.  HEENT-  Normocephalic, no lesions, without obvious abnormality.  Normal external eye and conjunctiva.  Cardiovascular- S1-S2 audible, pulses palpable throughout   Lungs-no rhonchi or wheezing noted, no excessive working breathing.  Saturations within normal limits on 4 L [place]. Extremities- Warm, dry and intact Musculoskeletal-no joint tenderness, deformity or swelling Skin-warm and dry, no hyperpigmentation, vitiligo, or suspicious lesions  Neurological Examination Mental  Status: Alert, oriented, name/age.month/year/situation/place. Naming objects and repetition intact.  Speech fluent without evidence of aphasia.  Able to follow commands without difficulty. Cranial Nerves: II: Visual fields grossly normal,  III,IV, VI: ptosis not present, extra-ocular motions intact bilaterally, 2-3 nystagmus noted in left eye with left lateral gaze. pupils equal, round, reactive to light and accommodation V,VII: smile symmetric, facial light touch sensation  normal bilaterally VIII: hearing normal bilaterally IX,X: uvula rises symmetrically XI: bilateral shoulder shrug XII: midline tongue extension Motor: Right : Upper extremity   5/5    Left:     Upper extremity   5/5  Lower extremity   5/5     Lower extremity   5/5 Tone and bulk:normal tone throughout; no atrophy noted Sensory: light touch intact throughout, bilaterally Deep Tendon Reflexes: 2+ and symmetric biceps and patella Plantars: Right: downgoing   Left: downgoing Cerebellar: normal finger-to-nose, normal rapid alternating movements and normal heel-to-shin test Gait: deferred   Lab Results: Basic Metabolic Panel: Recent Labs  Lab 11/30/17 0413 12/02/17 0444 12/03/17 0316 12/04/17 0400 12/05/17 0459  NA 138 136 139 137 139  K 4.4 4.1 4.3 4.3 5.3*  CL 114* 113* 113* 113* 114*  CO2 18* 19* 19* 20* 21*  GLUCOSE 87 103* 108* 115* 102*  BUN 29* 19 12 7* 9  CREATININE 1.19* 1.04* 1.05* 1.09* 1.31*  CALCIUM 8.1* 8.0* 8.3* 8.2* 8.1*    CBC: Recent Labs  Lab 11/29/17 0550  12/01/17 2356 12/02/17 0444 12/03/17 0316 12/04/17 0400 12/05/17 0459  WBC 10.3   < > 13.8* 11.8* 11.7* 10.6* 11.8*  NEUTROABS 7.6  --   --   --   --   --   --   HGB 12.7   < > 8.6* 7.7* 7.6* 7.4* 7.3*  HCT 41.0   < > 26.9* 23.7* 23.7* 22.9* 23.2*  MCV 96.5   < > 94.7 93.3 94.4 95.8 97.5  PLT 273   < > 198 197 202 181 194   < > = values in this interval not displayed.    Lipid Panel: No results for input(s): CHOL, TRIG,  HDL, CHOLHDL, VLDL, LDLCALC in the last 168 hours.  CBG: Recent Labs  Lab 11/30/17 1129  GLUCAP 78    Imaging: Ct Head Wo Contrast  Result Date: 12/05/2017  IMPRESSION: Atrophy and chronic microvascular ischemic changes in the white matter. No acute infarct or hemorrhage.     Laurey Morale, MSN, NP-C [facility] [phone]  12/05/2017, 3:31 PM   Attending physician note to follow with Assessment and plan .   Assessment: 72 y.o. female 55  With PMH of paroxysmal a fib ( on xarelto) currently stopped d/t GI bleed., HTN, HLD, diastolic HHF, CAD with cath on 10/07/17, COPD, OSA on CPAP,  in place who came to [facility] initially for bright red blood in rectum. Neurology was consulted for transient word finding difficulty. CT Head: negative for hemorrhage. MRI : pending. Given that symptoms had completely resolved on exam and symptoms most consistent with a TIA.   Impression: Stroke Risk Factors - atrial fibrillation, hyperlipidemia and hypertension Etiology : Likely cardioembolic   Recommendations: -MRI Brain : no acute stroke - neurochecks - resume anticaogulation - Echocardiogram   NEUROHOSPITALIST ADDENDUM Performed a face to face diagnostic evaluation.   I have reviewed the contents of history and physical exam as documented by PA/ARNP/Resident and agree with above documentation.  I have discussed and formulated the above plan as documented. Edits to the note have been made as needed.  Impression: Patient has known history of atrial fibrillation on anticoagulation.  This was stopped due to GI bleed and blood loss anemia.  Hemoglobin is currently 7.3.  Episode today is concerning for TIA, fortunately MRI is negative for acute stroke. Focal deficits on exam noted.  Language appeared to be intact. According to GI note okay to resume anticoagulation.  Recommend restarting anticoagulation, consider heparin drip for 1 to 2 days as may be safer and monitor for GI bleed,  hemoglobin drop.  However,  will defer choice of anticoagulation to primary team.   Karena Addison Khole Branch MD [facility] [phone]   If 7pm to 7am, please call on call as listed on AMION.

## 2017-12-05 NOTE — Plan of Care (Signed)
  Problem: Pain Managment: Goal: General experience of comfort will improve Outcome: Progressing   Problem: Coping: Goal: Level of anxiety will decrease Outcome: Not Progressing   Problem: Nutrition: Goal: Adequate nutrition will be maintained Outcome: Completed/Met

## 2017-12-05 NOTE — Progress Notes (Signed)
ANTICOAGULATION CONSULT NOTE - Initial Consult  Pharmacy Consult for heparin Indication: atrial fibrillation  Allergies  Allergen Reactions  . Amiodarone     Dyspnea - felt to be 2/2 amio lung toxicity Has tolerated Omnipaque   . Zyban [Bupropion] Itching    Patient Measurements: Height: _0  (160 cm) Weight: 191 lb 3.2 oz (86.7 kg) IBW/kg (Calculated) : 52.4 Heparin Dosing Weight: 71 kg  Vital Signs: Temp: 98.7 F (37.1 C) (08/31 1305) Temp Source: Oral (08/31 1305) BP: 91/47 (08/31 1305) Pulse Rate: 67 (08/31 1305)  Labs: Recent Labs    12/03/17 0316 12/04/17 0400 12/05/17 0459  HGB 7.6* 7.4* 7.3*  HCT 23.7* 22.9* 23.2*  PLT 202 181 194  CREATININE 1.05* 1.09* 1.31*    Estimated Creatinine Clearance: 40.5 mL/min (A) (by C-G formula based on SCr of 1.31 mg/dL (H)).   Medical History: Past Medical History:  Diagnosis Date  . Apical variant hypertrophic cardiomyopathy ([place])    Diagnosed by echo and ECG 11/13  . Atypical atrial flutter ([place])   . CAD (coronary artery disease)    a. LHC in 10/2011 demonstrated non-obstructive disease and an 80% lesion in a small D1 treated medically.   . CHF (congestive heart failure) ([place])   . Chronic diastolic heart failure ([place])   . CKD (chronic kidney disease), stage III ([place])   . COPD (chronic obstructive pulmonary disease) ([place])   . Dizziness    had PT for being off balance - in approx. 2012  . GERD (gastroesophageal reflux disease)   . Hyperlipidemia   . Hypertension   . Hypothyroid   . Lumbar disc disease   . Macular degeneration   . OSA (obstructive sleep apnea)   . Persistent atrial fibrillation ([place])       . Pulmonary hypertension ([place])    a. [place] 05/2013 showed mild PAH with normal PCWP and RA pressure, could be related to OSA and low oxygen saturation.   . Refusal of blood transfusions as patient is Jehovah's Witness   . Sinus bradycardia      Assessment: 72 yoF on Xarelto PTA for AFib admitted for GI  bleeding. Pt now s/p colonoscopy with no active bleeding. Initial plan was to hold anticoagulation for 7 days, but pt now with questionable TIA so pharmacy consulted to start IV heparin with no bolus. CT head negative for acute hemorrhage. Last dose of Xarelto was 8/24 so will dose with heparin levels.  Goal of Therapy:  Heparin level 0.3-0.5 units/ml Monitor platelets by anticoagulation protocol: Yes   Plan:  -IV heparin 850 units/hr with no bolus -Check heparin level with am labs -Monitor closely for S/Sx bleeding  Arrie Senate, PharmD, BCPS Clinical Pharmacist Please check AMION for all [place] numbers 12/05/2017

## 2017-12-05 NOTE — Progress Notes (Addendum)
PROGRESS NOTE    OTA EBERSOLE  EPP:[id number] DOB: 01-28-1946 DOA: 11/29/2017 PCP: Aretta Nip, MD    Brief Narrative: Amy Core Whren-Nobleis a 72 y.o.femalewith medical history significant forparoxysmal atrial fibrillation on Xarelto, hypertension, hyperlipidemia, hypothyroidism diastolic heart failure, CAD, with catheterization in October 07, 2017, nonobstructive CAD medical management, OSA on CPAP, as well as apical H COM by cardiac MRI, with mild to moderate pulmonary hypertension on chronic O2 4L [place], on CellCept, OPSUMIT Uptravi, followed by cardiology and pulmonology,presenting to the emergency department with bright red blood per rectum.  Gastroenterology was consulted.  Assessment & Plan:   Active Problems:   Asthmatic bronchitis , chronic (HCC)   Hypothyroidism   Essential hypertension   Apical variant hypertrophic cardiomyopathy (HCC)   Chronic diastolic heart failure (HCC)   OSA (obstructive sleep apnea)   CAD (coronary artery disease)   PAF (paroxysmal atrial fibrillation) (HCC)   Chronic respiratory failure with hypoxia (HCC)   GIB (gastrointestinal bleeding)   Expressive aphasia, weakness.  Per daughter patient had  difficulty expressing herself, not talking in complete sentences, around 2;40. Also patient now report that when I saw her this morning she couldn't  understand what I was telling her. She answer questions to me in the morning.  Patient feels weak, tired. She was very weak getting from bedside commode to bed.  Patient is alert, she is able to answer questions. She feels tired. Per daughter patient is now more improved but not herself.  -Will check CT head stat.  -Neurology Consulted.  -Patient is high risk for TPA due to recent GI bleed and she refuse blood product.  -Discussed with Dr Lorraine Lax. Will start heparin gtt , monitor for evidence of bleeding and transition to oral anticoagulation if she tolerates it.  Repeat labs in am.    Acute  lower GI bleed; Jehovah's Witness, Decline blood product  Transfer to step down 8-26 due to hypotension and persistent GI bleed.  GI consulted and following, S/P Flex sig on 8-26 unrevealing.  Nuclear scan did not showed source of bleeding.  Evaluated by cardiology high risk for procedure, but not prohibited.  Received IV iron 8-28 Limit blood drawn, started  B 12 and folic acid.  Hb stable today at 7.6. Per nurse stool more brownish.  Colonoscopy today. Oxygen sat stable on chronic 4 L.  Plan to hold anticoagulation for one week.  Hb stable.    Chronic diastolic CHF / Pulmonary hypertension / Chronic hypoxic respiratory failure On chronic 4 L oxygen.  Holding lasix due to soft BP.  Stable.   CAD; cath July 2019, non obstructive CAD  H COM by cardiac MRI, with mild to moderate pulmonary hypertension, P-ANCA+, CCP+, RO-52+ Holding Cellcept.    PAF hold xarelto for 1 week.   Might need resume anticoagulation sooner if she had stroke.   CKD 3 Stable.  Mild elevation of K, mild hemolysis   HLD;  On statins.   Hypothyroidism;  Continue with synthroid.   Transient numbness finger;  Monitor, rest neuro exam unremarkable.  Resolved./   DVT prophylaxis: scd Code Status:  Full code.  Family Communication: care discussed with patient.  Disposition Plan: remain in step down unit. If patient develops hypotension will need transfer to ICU.   Consultants:   GI  Cardiology    Procedures:   Flex    Antimicrobials:  none   Subjective: Alert, answer questions. She was able to tell me , she is not having pain.  She recognized daughter. She denies chest pain or dyspnea. She feels weak. She couldn't understand me this morning, and she was not able to tell me.     Objective: Vitals:   12/05/17 0500 12/05/17 0817 12/05/17 1305 12/05/17 1326  BP: (!) 97/51 (!) 93/49 (!) 91/47   Pulse: 72 77 67   Resp: 17     Temp: 98.6 F (37 C) 99 F (37.2 C) 98.7 F (37.1 C)     TempSrc: Oral  Oral   SpO2: 100% 94% 100% 100%  Weight: 86.7 kg     Height:        Intake/Output Summary (Last 24 hours) at 12/05/2017 1516 Last data filed at 12/05/2017 1300 Gross per 24 hour  Intake 240 ml  Output 750 ml  Net -510 ml   Filed Weights   12/03/17 0530 12/04/17 0551 12/05/17 0500  Weight: 87 kg 86.4 kg 86.7 kg    Examination:  General exam: NAD Respiratory system: CTA Cardiovascular system: S 1, S 2 RRR Gastrointestinal system: BS present, soft, nt Central nervous system: alert, motor exam 5/5 speech clear, flat affect.  Extremities: symmetric power. Generalized weak.  Skin: No rashes    Data Reviewed: I have personally reviewed following labs and imaging studies  CBC: Recent Labs  Lab 11/29/17 0550  12/01/17 2356 12/02/17 0444 12/03/17 0316 12/04/17 0400 12/05/17 0459  WBC 10.3   < > 13.8* 11.8* 11.7* 10.6* 11.8*  NEUTROABS 7.6  --   --   --   --   --   --   HGB 12.7   < > 8.6* 7.7* 7.6* 7.4* 7.3*  HCT 41.0   < > 26.9* 23.7* 23.7* 22.9* 23.2*  MCV 96.5   < > 94.7 93.3 94.4 95.8 97.5  PLT 273   < > 198 197 202 181 194   < > = values in this interval not displayed.   Basic Metabolic Panel: Recent Labs  Lab 11/30/17 0413 12/02/17 0444 12/03/17 0316 12/04/17 0400 12/05/17 0459  NA 138 136 139 137 139  K 4.4 4.1 4.3 4.3 5.3*  CL 114* 113* 113* 113* 114*  CO2 18* 19* 19* 20* 21*  GLUCOSE 87 103* 108* 115* 102*  BUN 29* 19 12 7* 9  CREATININE 1.19* 1.04* 1.05* 1.09* 1.31*  CALCIUM 8.1* 8.0* 8.3* 8.2* 8.1*   GFR: Estimated Creatinine Clearance: 40.5 mL/min (A) (by C-G formula based on SCr of 1.31 mg/dL (H)). Liver Function Tests: Recent Labs  Lab 11/29/17 0550 11/30/17 0413  AST 26 22  ALT 42 40  ALKPHOS 48 46  BILITOT 0.7 0.9  PROT 5.6* 5.3*  ALBUMIN 3.1* 2.9*   Recent Labs  Lab 11/29/17 0550  LIPASE 46   No results for input(s): AMMONIA in the last 168 hours. Coagulation Profile: Recent Labs  Lab 11/29/17 0550  INR  2.09   Cardiac Enzymes: Recent Labs  Lab 11/29/17 0550 11/29/17 1209 11/29/17 1553  TROPONINI 0.11* 0.15* 0.16*   BNP (last 3 results) No results for input(s): PROBNP in the last 8760 hours. HbA1C: No results for input(s): HGBA1C in the last 72 hours. CBG: Recent Labs  Lab 11/30/17 1129  GLUCAP 78   Lipid Profile: No results for input(s): CHOL, HDL, LDLCALC, TRIG, CHOLHDL, LDLDIRECT in the last 72 hours. Thyroid Function Tests: No results for input(s): TSH, T4TOTAL, FREET4, T3FREE, THYROIDAB in the last 72 hours. Anemia Panel: Recent Labs    12/04/17 1302  FERRITIN 205  TIBC 251  IRON 382*   Sepsis Labs: No results for input(s): PROCALCITON, LATICACIDVEN in the last 168 hours.  Recent Results (from the past 240 hour(s))  C difficile quick scan w PCR reflex     Status: None   Collection Time: 11/29/17  8:12 AM  Result Value Ref Range Status   C Diff antigen NEGATIVE NEGATIVE Final   C Diff toxin NEGATIVE NEGATIVE Final   C Diff interpretation No C. difficile detected.  Final    Comment: Performed at [facility], [place] [address] [place], [place] [address]  Gastrointestinal Panel by PCR , Stool     Status: None   Collection Time: 11/29/17  8:12 AM  Result Value Ref Range Status   Campylobacter species NOT DETECTED NOT DETECTED Final   Plesimonas shigelloides NOT DETECTED NOT DETECTED Final   Salmonella species NOT DETECTED NOT DETECTED Final   Yersinia enterocolitica NOT DETECTED NOT DETECTED Final   Vibrio species NOT DETECTED NOT DETECTED Final   Vibrio cholerae NOT DETECTED NOT DETECTED Final   Enteroaggregative E coli (EAEC) NOT DETECTED NOT DETECTED Final   Enteropathogenic E coli (EPEC) NOT DETECTED NOT DETECTED Final   Enterotoxigenic E coli (ETEC) NOT DETECTED NOT DETECTED Final   Shiga like toxin producing E coli (STEC) NOT DETECTED NOT DETECTED Final   Shigella/Enteroinvasive E coli (EIEC) NOT DETECTED NOT DETECTED Final   Cryptosporidium NOT  DETECTED NOT DETECTED Final   Cyclospora cayetanensis NOT DETECTED NOT DETECTED Final   Entamoeba histolytica NOT DETECTED NOT DETECTED Final   Giardia lamblia NOT DETECTED NOT DETECTED Final   Adenovirus F40/41 NOT DETECTED NOT DETECTED Final   Astrovirus NOT DETECTED NOT DETECTED Final   Norovirus GI/GII NOT DETECTED NOT DETECTED Final   Rotavirus A NOT DETECTED NOT DETECTED Final   Sapovirus (I, II, IV, and V) NOT DETECTED NOT DETECTED Final    Comment: Performed at [facility], [place] [address] [place], [place] [address]  MRSA PCR Screening     Status: None   Collection Time: 11/30/17 12:23 PM  Result Value Ref Range Status   MRSA by PCR NEGATIVE NEGATIVE Final    Comment:        The GeneXpert MRSA Assay (FDA approved for NASAL specimens only), is one component of a comprehensive MRSA colonization surveillance program. It is not intended to diagnose MRSA infection nor to guide or monitor treatment for MRSA infections. Performed at [facility], [place] [address] [place], [place] [address]          Radiology Studies: No results found.      Scheduled Meds: . atorvastatin  80 mg Oral Q lunch  . colchicine  0.6 mg Oral Daily  . dronedarone  400 mg Oral BID WC  . ezetimibe  10 mg Oral Daily  . folic acid  1 mg Oral Daily  . ipratropium-albuterol  3 mL Nebulization TID  . levothyroxine  25 mcg Oral QAC breakfast  . macitentan  10 mg Oral Daily  . predniSONE  10 mg Oral BID WC  . Selexipag  1,600 mcg Oral BID  . vitamin B-12  100 mcg Oral Daily   Continuous Infusions: . famotidine (PEPCID) IV 20 mg (12/05/17 0923)     LOS: 5 days    Time spent: 35 minutes.     Elmarie Shiley, MD [facility] Pager [phone]  If 7PM-7AM, please contact night-coverage [email or web address] Password [email or web address] 12/05/2017, 3:16 PM

## 2017-12-06 ENCOUNTER — Inpatient Hospital Stay (HOSPITAL_COMMUNITY): Payer: Medicare Other

## 2017-12-06 DIAGNOSIS — R479 Unspecified speech disturbances: Secondary | ICD-10-CM

## 2017-12-06 DIAGNOSIS — R4182 Altered mental status, unspecified: Secondary | ICD-10-CM

## 2017-12-06 LAB — BASIC METABOLIC PANEL
Anion gap: 4 — ABNORMAL LOW (ref 5–15)
Anion gap: 6 (ref 5–15)
BUN: 12 mg/dL (ref 8–23)
BUN: 13 mg/dL (ref 8–23)
CALCIUM: 8.2 mg/dL — AB (ref 8.9–10.3)
CHLORIDE: 111 mmol/L (ref 98–111)
CHLORIDE: 111 mmol/L (ref 98–111)
CO2: 23 mmol/L (ref 22–32)
CO2: 21 mmol/L — AB (ref 22–32)
CREATININE: 1.31 mg/dL — AB (ref 0.44–1.00)
CREATININE: 1.31 mg/dL — AB (ref 0.44–1.00)
Calcium: 8 mg/dL — ABNORMAL LOW (ref 8.9–10.3)
GFR calc non Af Amer: 40 mL/min — ABNORMAL LOW (ref >60)
GFR calc non Af Amer: 40 mL/min — ABNORMAL LOW (ref >60)
GFR, EST AFRICAN AMERICAN: 46 mL/min — AB (ref >60)
GFR, EST AFRICAN AMERICAN: 46 mL/min — AB (ref >60)
GLUCOSE: 112 mg/dL — AB (ref 70–99)
GLUCOSE: 126 mg/dL — AB (ref 70–99)
Potassium: 3.9 mmol/L (ref 3.5–5.1)
Potassium: 4.2 mmol/L (ref 3.5–5.1)
Sodium: 138 mmol/L (ref 135–145)
Sodium: 138 mmol/L (ref 135–145)

## 2017-12-06 LAB — HEPARIN LEVEL (UNFRACTIONATED)
HEPARIN UNFRACTIONATED: 0.58 [IU]/mL (ref 0.30–0.70)
Heparin Unfractionated: 0.72 IU/mL — ABNORMAL HIGH (ref 0.30–0.70)

## 2017-12-06 LAB — CBC
HCT: 22.9 % — ABNORMAL LOW (ref 36.0–46.0)
HCT: 22.4 % — ABNORMAL LOW (ref 36.0–46.0)
Hemoglobin: 7.2 g/dL — ABNORMAL LOW (ref 12.0–15.0)
Hemoglobin: 7 g/dL — ABNORMAL LOW (ref 12.0–15.0)
MCH: 30.6 pg (ref 26.0–34.0)
MCH: 30.6 pg (ref 26.0–34.0)
MCHC: 31.3 g/dL (ref 30.0–36.0)
MCHC: 31.4 g/dL (ref 30.0–36.0)
MCV: 97.8 fL (ref 78.0–100.0)
MCV: 97.4 fL (ref 78.0–100.0)
PLATELETS: 175 10*3/uL (ref 150–400)
PLATELETS: 182 10*3/uL (ref 150–400)
RBC: 2.35 MIL/uL — ABNORMAL LOW (ref 3.87–5.11)
RBC: 2.29 MIL/uL — ABNORMAL LOW (ref 3.87–5.11)
RDW: 18.7 % — ABNORMAL HIGH (ref 11.5–15.5)
RDW: 18.8 % — AB (ref 11.5–15.5)
WBC: 12.8 10*3/uL — ABNORMAL HIGH (ref 4.0–10.5)
WBC: 11.8 10*3/uL — AB (ref 4.0–10.5)

## 2017-12-06 NOTE — Progress Notes (Signed)
STROKE TEAM PROGRESS NOTE   HISTORY OF PRESENT ILLNESS (per record) Amy [name] is an 72 y.o. female  With PMH of paroxysmal a fib ( on xarelto) currently stopped d/t GI bleed., HTN, HLD, diastolic CHF, CAD with cath on 10/07/17, OSA on CPAP,  in place who came to [facility] initially for bright red blood in rectum.    Patient daughter stated that about 28 today she came to visit her mother and noticed that she was" not right". While assisting her mother from the bedside commode back to the bed she states that her mother was unable to answer questions, she could only shake her head yes and no, she appeared agitated and could not figure out how to properly get into the bed. Her mother's mentation began to gradually come back. The entire episode lasted about 30 minutes. Patient denies HA, vision changes, focal weakness, facial droop, CP, SOB. Patient is on oxygen at baseline. Patient endorses that she woke up this morning in her usual state of health, but at some point this morning when the MD's were visiting she had an episode similar to this. She states that she did not understand what the doctors were saying to her. She could also not find her words and it resolved. Had the second episode which is what her daughter witnessed.   ED course:  CT head: no acute infarct or hemorrhage. MRI: pending  No prior stroke history noted.    Date last known well: Date: 12/05/2017 Time last known well: Unable to determine tPA Given: No: contraindicated; recent  GI bleed  Modified Rankin: Rankin Score=1  NIHSS:0   SUBJECTIVE (INTERVAL HISTORY) No family members in the room. The patient is very knowledgeable and articulate regarding her medical issues and medications.  She was able to give a very good history.  She believes her GI bleed was secondary to  Cellcept-an autoimmune medication that was started for her pulmonary hypertension. 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    OBJECTIVE Vitals:   12/05/17 1937 12/05/17 2147 12/06/17 0448 12/06/17 0809  BP: 115/79  (!) 102/53 (!) 92/53  Pulse: 72  81 79  Resp: 16  (!) 29   Temp: (!) 97.4 F (36.3 C)  98.6 F (37 C) 98.8 F (37.1 C)  TempSrc: Oral  Oral Oral  SpO2: 97% 99% 98% 95%  Weight:   86.5 kg   Height:        CBC:  Recent Labs  Lab 12/06/17 0044 12/06/17 0549  WBC 12.8* 11.8*  HGB 7.2* 7.0*  HCT 22.9* 22.4*  MCV 97.4 97.8  PLT 182 270    Basic Metabolic Panel:  Recent Labs  Lab 12/06/17 0044 12/06/17 0549  NA 138 138  K 3.9 4.2  CL 111 111  CO2 23 21*  GLUCOSE 126* 112*  BUN 13 12  CREATININE 1.31* 1.31*  CALCIUM 8.2* 8.0*    Lipid Panel:     Component Value Date/Time   CHOL 105 09/12/2016 1204   TRIG 76 09/12/2016 1204   HDL 41 09/12/2016 1204   CHOLHDL 2.6 09/12/2016 1204   CHOLHDL 3 09/27/2014 0946   VLDL 15.2 09/27/2014 0946   LDLCALC 49 09/12/2016 1204   HgbA1c:  Lab Results  Component Value Date   HGBA1C 6.1 (H) 10/28/2014   Urine Drug Screen: No results found for: LABOPIA, COCAINSCRNUR, LABBENZ, AMPHETMU, THCU, LABBARB  Alcohol Level No results found for: [facility]  IMAGING   Ct Head Wo Contrast 12/05/2017 IMPRESSION:  Atrophy and chronic microvascular ischemic changes in the white matter. No acute infarct or hemorrhage.    Mr Brain Wo Contrast 12/05/2017 IMPRESSION:  Negative for acute infarct. Mild chronic microvascular ischemia in the white matter. Diffuse vascular abnormality most consistent with intravenous iron infusion therapy. Correlate with history.     Transthoracic Echocardiogram with Bubble Study (performed secondary to CHF) 09/08/2017 Study Conclusions - Left ventricle: The cavity size was normal. Wall thickness was   increased in a pattern of moderate LVH. Thickening of the LV apex   consistent with apical variant HCM. Systolic function was   vigorous. The estimated ejection fraction was in the range of 65%    to 70%. Wall motion was normal; there were no regional wall   motion abnormalities. Doppler parameters are consistent with   restrictive left ventricular relaxation (grade 3 diastolic   dysfunction). The E/A ratio is >2. The E/e&' ratio is >20,   suggesting markedly elevated LV filling pressure. - Mitral valve: Calcified annulus. Mildly thickened leaflets .   There was mild regurgitation. - Left atrium: Severely dilated. - Atrial septum: Septum bows left to right, suggesting high LA   pressure. Negative for PFO or late shunting by saline microbubble   contrast. - Pulmonary arteries: PA peak pressure: 42 mm Hg (S). - Pericardium, extracardiac: A trivial pericardial effusion was   identified posterior to the heart. Features were not consistent   with tamponade physiology. Impressions: - Compared with a prior study in 06/2017, the LVEF is unchanged. The   degree of diastolic dysfunction is worse with a higher LV filling   pressure.    Bilateral Carotid Dopplers - pending 00/00/00     PHYSICAL EXAM Blood pressure (!) 92/53, pulse 79, temperature 98.8 F (37.1 C), temperature source Oral, resp. rate (!) 29, height _0  (1.6 m), weight 86.5 kg, SpO2 95 %.    Pleasant obese elderly Caucasian lady not in distress. . Afebrile. Head is nontraumatic. Neck is supple without bruit.    Cardiac exam no murmur or gallop. Lungs are clear to auscultation. Distal pulses are well felt.  Neurological Exam ;  Awake  Alert oriented x 3. Normal speech and language.eye movements full without nystagmus.fundi were not visualized. Vision acuity and fields appear normal. Hearing is normal. Palatal movements are normal. Face symmetric. Tongue midline. Normal strength, tone, reflexes and coordination. Normal sensation. Gait deferred.       ASSESSMENT/PLAN [name] with history of atrial fibrillation (Eliquis stopped secondary to GI bleed), pulmonary hypertension,  hypertension, hyperlipidemia, hypothyroidism, COPD, chronic kidney disease, congestive heart failure, and coronary artery disease presenting with Gi bleed and subsequent speech difficulties. She did not receive IV t-PA due to Gi bleed.   Possible TIA:    Resultant - resolution of deficits.  CT head - No acute infarct or hemorrhage. Atrophy and chronic microvascular ischemic changes in the white matter.   MRI head - Negative for acute infarct. Diffuse vascular abnormality most consistent with intravenous iron infusion therapy. Correlate with history.   MRA head - not performed  Carotid Doppler - pending  2D Echo - EF 65% to 70%  LDL - 49  HgbA1c - 6.1  VTE prophylaxis - IV Heaparin  Diet - Heart healthy / carb modified with thin liquids.  Eliquis (apixaban) daily prior to admission, now on heparin IV  Patient counseled to be compliant with her antithrombotic  medications  Ongoing aggressive stroke risk factor management  Therapy recommendations:  pending  Disposition:  Pending  Hypertension  Mildly low at times . Permissive hypertension (OK if < 220/120) but gradually normalize in 5-7 days . Long-term BP goal normotensive  Hyperlipidemia  Lipid lowering medication PTA:  Lipitor 80 mg daily and Zetia 10 mg daily  LDL 49, goal < 70  Current lipid lowering medication:   Lipitor 80 mg daily and Zetia 10 mg daily  Continue statin at discharge    Other Stroke Risk Factors  Advanced age  Former cigarette smoker - quit  Obesity, Body mass index is 33.8 kg/m., recommend weight loss, diet and exercise as appropriate   Coronary artery disease  Atrial fibrillation  OSA on C-pap   Other Active Problems  Recent Gi bleed  Anemia - Hb 7.0  Jehovah's Witness - does not want blood transfusions.  Mild hypotension  Mild leukocytosis  CKD  Afib - Eliquis DC'd now on IV Heparin.   Mikey Bussing PA-C [facility] Pager [phone] 12/06/2017, 3:43 PM    Hospital day # 6  I have personally examined this patient, reviewed notes, independently viewed imaging studies, participated in medical decision making and plan of care.ROS completed by me personally and pertinent positives fully documented  I have made any additions or clarifications directly to the above note. Agree with note above.  She had a transient episode of expressive language difficulties likely a TIA from atrial fibrillation while anticoagulation was on hold for GI hemorrhage.  She has been started on IV heparin and if she can tolerate this without recurrent bleeding may consider restarting Xarelto if okay with gastroenterologist.  Continue ongoing stroke work-up.  Greater than 50% time during this 35-minute visit was spent on counseling and coordination of care about atrial fibrillation and stroke risk and risk benefit of anticoagulation and discussion about bleeding risk and answering questions. Antony Contras, MD Medical Director [place] Pager: [phone] 12/06/2017 3:46 PM   To contact Stroke Continuity provider, please refer to [email or web address] After hours, contact General Neurology

## 2017-12-06 NOTE — Progress Notes (Signed)
[place] for Heparin Indication: atrial fibrillation  Allergies  Allergen Reactions  . Amiodarone     Dyspnea - felt to be 2/2 amio lung toxicity Has tolerated Omnipaque   . Zyban [Bupropion] Itching    Patient Measurements: Height: _0  (160 cm) Weight: 190 lb 12.8 oz (86.5 kg) IBW/kg (Calculated) : 52.4 Heparin Dosing Weight: 71 kg  Vital Signs: Temp: 98.6 F (37 C) (09/01 0448) Temp Source: Oral (09/01 0448) BP: 102/53 (09/01 0448) Pulse Rate: 81 (09/01 0448)  Labs: Recent Labs    12/05/17 0459 12/06/17 0044 12/06/17 0549  HGB 7.3* 7.2* 7.0*  HCT 23.2* 22.9* 22.4*  PLT 194 182 175  HEPARINUNFRC  --   --  0.72*  CREATININE 1.31* 1.31* 1.31*    Estimated Creatinine Clearance: 40.4 mL/min (A) (by C-G formula based on SCr of 1.31 mg/dL (H)).   Medical History: Past Medical History:  Diagnosis Date  . Apical variant hypertrophic cardiomyopathy ([place])    Diagnosed by echo and ECG 11/13  . Atypical atrial flutter ([place])   . CAD (coronary artery disease)    a. LHC in 10/2011 demonstrated non-obstructive disease and an 80% lesion in a small D1 treated medically.   . CHF (congestive heart failure) ([place])   . Chronic diastolic heart failure ([place])   . CKD (chronic kidney disease), stage III ([place])   . COPD (chronic obstructive pulmonary disease) ([place])   . Dizziness    had PT for being off balance - in approx. 2012  . GERD (gastroesophageal reflux disease)   . Hyperlipidemia   . Hypertension   . Hypothyroid   . Lumbar disc disease   . Macular degeneration   . OSA (obstructive sleep apnea)   . Persistent atrial fibrillation ([place])       . Pulmonary hypertension ([place])    a. [place] 05/2013 showed mild PAH with normal PCWP and RA pressure, could be related to OSA and low oxygen saturation.   . Refusal of blood transfusions as patient is Jehovah's Witness   . Sinus bradycardia     Assessment: 72 yoF on Xarelto PTA for AFib  admitted for GI bleeding. Pt now s/p colonoscopy with no active bleeding. Initial plan was to hold anticoagulation for 7 days, but pt now with questionable TIA so pharmacy consulted to start IV heparin with no bolus. CT head negative for acute hemorrhage. Last dose of Xarelto was 8/24 so will dose with heparin levels.  9/1 AM update: initial heparin level is high, no issues per RN, Hgb in the 7-7.5 range for the last 3-4 days  Goal of Therapy:  Heparin level 0.3-0.5 units/ml Monitor platelets by anticoagulation protocol: Yes   Plan:  -Dec heparin to 700 units/hr -1430 HL -Monitor closely for S/Sx bleeding, trend Hgb  Narda Bonds, PharmD, [place] Pharmacist Phone: [phone]

## 2017-12-06 NOTE — Progress Notes (Signed)
PROGRESS NOTE    Amy [name]  OTR:[id number] DOB: 01/22/1946 DOA: 11/29/2017 PCP: Aretta Nip, MD    Brief Narrative: Amy [name] a 72 y.o.femalewith medical history significant forparoxysmal atrial fibrillation on Xarelto, hypertension, hyperlipidemia, hypothyroidism diastolic heart failure, CAD, with catheterization in October 07, 2017, nonobstructive CAD medical management, OSA on CPAP, as well as apical H COM by cardiac MRI, with mild to moderate pulmonary hypertension on chronic O2 4L [place], on CellCept, OPSUMIT Uptravi, followed by cardiology and pulmonology,presenting to the emergency department with bright red blood per rectum.  Gastroenterology was consulted.  Assessment & Plan:   Active Problems:   Asthmatic bronchitis , chronic (HCC)   Hypothyroidism   Essential hypertension   Apical variant hypertrophic cardiomyopathy (HCC)   Chronic diastolic heart failure (HCC)   OSA (obstructive sleep apnea)   CAD (coronary artery disease)   PAF (paroxysmal atrial fibrillation) (HCC)   Chronic respiratory failure with hypoxia (HCC)   GIB (gastrointestinal bleeding)   Expressive aphasia, weakness.  Per daughter patient had  difficulty expressing herself, not talking in complete sentences, around 2;40. Also patient now report that when I saw her this morning she couldn't  understand what I was telling her. She answer questions to me in the morning.  Patient feels weak, tired. She was very weak getting from bedside commode to bed.  Patient is alert, she is able to answer questions. She feels tired. Per daughter patient is now more improved but not herself.  -Will check CT head stat.  -Neurology Consulted.  -Patient is high risk for TPA due to recent GI bleed and she refuse blood product.  -Discussed with Dr Lorraine Lax. Will start heparin gtt , monitor for evidence of bleeding and transition to oral anticoagulation if she tolerates it.  Feeling better.  Will keep  heparin today with plan to transition to oral tomorrow.    Acute lower GI bleed; Jehovah's Witness, Decline blood product  Transfer to step down 8-26 due to hypotension and persistent GI bleed.  GI consulted and following, S/P Flex sig on 8-26 unrevealing.  Nuclear scan did not showed source of bleeding.  Evaluated by cardiology high risk for procedure, but not prohibited.  Received IV iron 8-28 Limit blood drawn, started  B 12 and folic acid.  Hb stable today at 7.6. Per nurse stool more brownish.  Colonoscopy today. Oxygen sat stable on chronic 4 L.  Hb stable.  Had TIA yesterday. Evaluated by neurology who recommended start anticoagulation.  On IV heparin. Plan to transition to eliquis tomorrow. Check erythropoietin level.   Chronic diastolic CHF / Pulmonary hypertension / Chronic hypoxic respiratory failure On chronic 4 L oxygen.  Holding lasix due to soft BP.  Stable.   CAD; cath July 2019, non obstructive CAD  H COM by cardiac MRI, with mild to moderate pulmonary hypertension, P-ANCA+, CCP+, RO-52+ Holding Cellcept.    PAF hold xarelto for 1 week.   Might need resume anticoagulation sooner if she had stroke.   CKD 3 Stable.  Mild elevation of K, mild hemolysis   HLD;  On statins.   Hypothyroidism;  Continue with synthroid.   Transient numbness finger;  Monitor, rest neuro exam unremarkable.  Resolved./   DVT prophylaxis: scd Code Status:  Full code.  Family Communication: care discussed with patient.  Disposition Plan: remain in step down unit. If patient develops hypotension will need transfer to ICU.   Consultants:   GI  Cardiology    Procedures:  Flex    Antimicrobials:  none   Subjective: No BM. Feels better. She is alert. Conversant. Answering questions.    Objective: Vitals:   12/05/17 1937 12/05/17 2147 12/06/17 0448 12/06/17 0809  BP: 115/79  (!) 102/53 (!) 92/53  Pulse: 72  81 79  Resp: 16  (!) 29   Temp: (!) 97.4 F (36.3  C)  98.6 F (37 C) 98.8 F (37.1 C)  TempSrc: Oral  Oral Oral  SpO2: 97% 99% 98% 95%  Weight:   86.5 kg   Height:        Intake/Output Summary (Last 24 hours) at 12/06/2017 1252 Last data filed at 12/06/2017 0306 Gross per 24 hour  Intake 686.47 ml  Output 700 ml  Net -13.53 ml   Filed Weights   12/04/17 0551 12/05/17 0500 12/06/17 0448  Weight: 86.4 kg 86.7 kg 86.5 kg    Examination:  General exam: NAD Respiratory system: CTA Cardiovascular system: S 1, S 2 RRR Gastrointestinal system: BS present, soft, nt Central nervous system: alert, non focal.  Extremities: no edema Skin: No rashes    Data Reviewed: I have personally reviewed following labs and imaging studies  CBC: Recent Labs  Lab 12/03/17 0316 12/04/17 0400 12/05/17 0459 12/06/17 0044 12/06/17 0549  WBC 11.7* 10.6* 11.8* 12.8* 11.8*  HGB 7.6* 7.4* 7.3* 7.2* 7.0*  HCT 23.7* 22.9* 23.2* 22.9* 22.4*  MCV 94.4 95.8 97.5 97.4 97.8  PLT 202 181 194 182 817   Basic Metabolic Panel: Recent Labs  Lab 12/03/17 0316 12/04/17 0400 12/05/17 0459 12/06/17 0044 12/06/17 0549  NA 139 137 139 138 138  K 4.3 4.3 5.3* 3.9 4.2  CL 113* 113* 114* 111 111  CO2 19* 20* 21* 23 21*  GLUCOSE 108* 115* 102* 126* 112*  BUN 12 7* _0 CREATININE 1.05* 1.09* 1.31* 1.31* 1.31*  CALCIUM 8.3* 8.2* 8.1* 8.2* 8.0*   GFR: Estimated Creatinine Clearance: 40.4 mL/min (A) (by C-G formula based on SCr of 1.31 mg/dL (H)). Liver Function Tests: Recent Labs  Lab 11/30/17 0413  AST 22  ALT 40  ALKPHOS 46  BILITOT 0.9  PROT 5.3*  ALBUMIN 2.9*   No results for input(s): LIPASE, AMYLASE in the last 168 hours. No results for input(s): AMMONIA in the last 168 hours. Coagulation Profile: No results for input(s): INR, PROTIME in the last 168 hours. Cardiac Enzymes: Recent Labs  Lab 11/29/17 1553  TROPONINI 0.16*   BNP (last 3 results) No results for input(s): PROBNP in the last 8760 hours. HbA1C: No results for  input(s): HGBA1C in the last 72 hours. CBG: Recent Labs  Lab 11/30/17 1129 12/05/17 1528  GLUCAP 78 89   Lipid Profile: No results for input(s): CHOL, HDL, LDLCALC, TRIG, CHOLHDL, LDLDIRECT in the last 72 hours. Thyroid Function Tests: No results for input(s): TSH, T4TOTAL, FREET4, T3FREE, THYROIDAB in the last 72 hours. Anemia Panel: Recent Labs    12/04/17 1302  FERRITIN 205  TIBC 251  IRON 382*   Sepsis Labs: No results for input(s): PROCALCITON, LATICACIDVEN in the last 168 hours.  Recent Results (from the past 240 hour(s))  C difficile quick scan w PCR reflex     Status: None   Collection Time: 11/29/17  8:12 AM  Result Value Ref Range Status   C Diff antigen NEGATIVE NEGATIVE Final   C Diff toxin NEGATIVE NEGATIVE Final   C Diff interpretation No C. difficile detected.  Final    Comment: Performed at  [facility], [place] [address] [place], [place] [address]  Gastrointestinal Panel by PCR , Stool     Status: None   Collection Time: 11/29/17  8:12 AM  Result Value Ref Range Status   Campylobacter species NOT DETECTED NOT DETECTED Final   Plesimonas shigelloides NOT DETECTED NOT DETECTED Final   Salmonella species NOT DETECTED NOT DETECTED Final   Yersinia enterocolitica NOT DETECTED NOT DETECTED Final   Vibrio species NOT DETECTED NOT DETECTED Final   Vibrio cholerae NOT DETECTED NOT DETECTED Final   Enteroaggregative E coli (EAEC) NOT DETECTED NOT DETECTED Final   Enteropathogenic E coli (EPEC) NOT DETECTED NOT DETECTED Final   Enterotoxigenic E coli (ETEC) NOT DETECTED NOT DETECTED Final   Shiga like toxin producing E coli (STEC) NOT DETECTED NOT DETECTED Final   Shigella/Enteroinvasive E coli (EIEC) NOT DETECTED NOT DETECTED Final   Cryptosporidium NOT DETECTED NOT DETECTED Final   Cyclospora cayetanensis NOT DETECTED NOT DETECTED Final   Entamoeba histolytica NOT DETECTED NOT DETECTED Final   Giardia lamblia NOT DETECTED NOT DETECTED Final   Adenovirus  F40/41 NOT DETECTED NOT DETECTED Final   Astrovirus NOT DETECTED NOT DETECTED Final   Norovirus GI/GII NOT DETECTED NOT DETECTED Final   Rotavirus A NOT DETECTED NOT DETECTED Final   Sapovirus (I, II, IV, and V) NOT DETECTED NOT DETECTED Final    Comment: Performed at [facility], [address] [place], [place] [address]  MRSA PCR Screening     Status: None   Collection Time: 11/30/17 12:23 PM  Result Value Ref Range Status   MRSA by PCR NEGATIVE NEGATIVE Final    Comment:        The GeneXpert MRSA Assay (FDA approved for NASAL specimens only), is one component of a comprehensive MRSA colonization surveillance program. It is not intended to diagnose MRSA infection nor to guide or monitor treatment for MRSA infections. Performed at [facility], [place] [address] [place], [place] [address]          Radiology Studies: Ct Head Wo Contrast  Result Date: 12/05/2017 CLINICAL DATA:  Speech abnormality EXAM: CT HEAD WITHOUT CONTRAST TECHNIQUE: Contiguous axial images were obtained from the base of the skull through the vertex without intravenous contrast. COMPARISON:  None. FINDINGS: Brain: Mild to moderate atrophy. Patchy hypodensity throughout the cerebral white matter bilaterally appears chronic. Negative for acute infarct, hemorrhage, or mass Vascular: Negative for hyperdense vessel Skull: Negative Sinuses/Orbits: paranasal sinuses clear. Bilateral cataract surgery. Other: None IMPRESSION: Atrophy and chronic microvascular ischemic changes in the white matter. No acute infarct or hemorrhage. Electronically Signed   By: Franchot Gallo M.D.   On: 12/05/2017 17:00   Mr Brain Wo Contrast  Result Date: 12/05/2017 CLINICAL DATA:  Speech difficulty EXAM: MRI HEAD WITHOUT CONTRAST TECHNIQUE: Multiplanar, multiecho pulse sequences of the brain and surrounding structures were obtained without intravenous contrast. COMPARISON:  CT head 12/05/2017 FINDINGS: Brain: Negative for  acute infarct. Mild chronic microvascular ischemic change in the white matter. Ventricle size normal. Cerebral volume normal. Vascular: T1 shortening of the arteries and veins diffusely in the brain. Patient did not receive intravenous gadolinium. Vessels are markedly low signal on gradient echo imaging. This is most consistent with intravenous iron infusion therapy. The patient does have GI bleeding and anemia. Correlate with history of iron fusion. Skull and upper cervical spine: Cervical spondylosis. Sinuses/Orbits: Mild mucosal edema paranasal sinuses. Mild mucosal edema in the mastoid sinus bilaterally. Bilateral cataract surgery Other: None  IMPRESSION: Negative for acute infarct. Mild chronic microvascular ischemia in the white matter. Diffuse vascular abnormality most consistent with intravenous iron infusion therapy. Correlate with history. Electronically Signed   By: Franchot Gallo M.D.   On: 12/05/2017 18:03        Scheduled Meds: . atorvastatin  80 mg Oral Q lunch  . colchicine  0.6 mg Oral Daily  . dronedarone  400 mg Oral BID WC  . ezetimibe  10 mg Oral Daily  . ferrous sulfate  325 mg Oral BID WC  . folic acid  1 mg Oral Daily  . ipratropium-albuterol  3 mL Nebulization TID  . levothyroxine  25 mcg Oral QAC breakfast  . macitentan  10 mg Oral Daily  . predniSONE  10 mg Oral BID WC  . Selexipag  1,600 mcg Oral BID  . vitamin B-12  100 mcg Oral Daily   Continuous Infusions: . famotidine (PEPCID) IV 20 mg (12/06/17 0854)  . heparin 700 Units/hr (12/06/17 5301)     LOS: 6 days    Time spent: 35 minutes.     Elmarie Shiley, MD [facility] Pager [phone]  If 7PM-7AM, please contact night-coverage [email or web address] Password [email or web address] 12/06/2017, 12:52 PM

## 2017-12-06 NOTE — Progress Notes (Signed)
ANTICOAGULATION CONSULT NOTE - [place] for heparin Indication: atrial fibrillation  Allergies  Allergen Reactions  . Amiodarone     Dyspnea - felt to be 2/2 amio lung toxicity Has tolerated Omnipaque   . Zyban [Bupropion] Itching    Patient Measurements: Height: _0  (160 cm) Weight: 190 lb 12.8 oz (86.5 kg) IBW/kg (Calculated) : 52.4 Heparin Dosing Weight: 71 kg  Vital Signs: Temp: 98.9 F (37.2 C) (09/01 1300) Temp Source: Oral (09/01 1300) BP: 88/38 (09/01 1300) Pulse Rate: 79 (09/01 0809)  Labs: Recent Labs    12/05/17 0459 12/06/17 0044 12/06/17 0549 12/06/17 1355  HGB 7.3* 7.2* 7.0*  --   HCT 23.2* 22.9* 22.4*  --   PLT 194 182 175  --   HEPARINUNFRC  --   --  0.72* 0.58  CREATININE 1.31* 1.31* 1.31*  --     Estimated Creatinine Clearance: 40.4 mL/min (A) (by C-G formula based on SCr of 1.31 mg/dL (H)).   Medical History: Past Medical History:  Diagnosis Date  . Apical variant hypertrophic cardiomyopathy ([place])    Diagnosed by echo and ECG 11/13  . Atypical atrial flutter ([place])   . CAD (coronary artery disease)    a. LHC in 10/2011 demonstrated non-obstructive disease and an 80% lesion in a small D1 treated medically.   . CHF (congestive heart failure) ([place])   . Chronic diastolic heart failure ([place])   . CKD (chronic kidney disease), stage III ([place])   . COPD (chronic obstructive pulmonary disease) ([place])   . Dizziness    had PT for being off balance - in approx. 2012  . GERD (gastroesophageal reflux disease)   . Hyperlipidemia   . Hypertension   . Hypothyroid   . Lumbar disc disease   . Macular degeneration   . OSA (obstructive sleep apnea)   . Persistent atrial fibrillation ([place])       . Pulmonary hypertension ([place])    a. [place] 05/2013 showed mild PAH with normal PCWP and RA pressure, could be related to OSA and low oxygen saturation.   . Refusal of blood transfusions as patient is Jehovah's Witness   . Sinus  bradycardia      Assessment: 72 yoF on Xarelto PTA for AFib admitted for GI bleeding. Pt now s/p colonoscopy with no active bleeding. Initial plan was to hold anticoagulation for 7 days, but pt now with questionable TIA so pharmacy consulted to start IV heparin with no bolus. CT head negative for acute hemorrhage. Last dose of Xarelto was 8/24 so will dose with heparin levels.  Heparin level above goal at 0.58, no issues with infusion or S/Sx bleeding per RN. Hgb 7 today but relatively stable.  Goal of Therapy:  Heparin level 0.3-0.5 units/ml Monitor platelets by anticoagulation protocol: Yes  Plan:  -Reduce IV heparin to 600 units/hr -Recheck heparin level with am labs  Arrie Senate, PharmD, BCPS Clinical Pharmacist Please check AMION for all [facility] Pharmacy numbers 12/06/2017

## 2017-12-06 NOTE — Progress Notes (Signed)
RT went into room to put patient on CPAP.  Patient stated she was waiting to have her blood thinness checked because she had been given Heparin.  She stated that she could put herself on when she was ready.

## 2017-12-06 NOTE — Progress Notes (Signed)
VASCULAR LAB PRELIMINARY  PRELIMINARY  PRELIMINARY  PRELIMINARY  Carotid duplex completed.    Preliminary report:  1-39% ICA stenosis. Vertebral artery flow is antegrade.   Brentney Goldbach, RVT 12/06/2017, 5:43 PM

## 2017-12-06 NOTE — Evaluation (Signed)
Physical Therapy Evaluation Patient Details Name: Amy [name] MRN: [id number] DOB: [birth year] Today's Date: 12/06/2017   History of Present Illness  Pt adm with GI bleed. Currently pt with Hgb of 7.0. Jehovah's Witness so no blood transfusion. Pt also had two TIA's affecting speech during hospitalization. CT and MRI negative for acute infarct. PMH - copd, pulmonary HTN, CAD, HTN, afib, chf  Clinical Impression  Pt presents to PT with mobility limited by fatigue due to medical issues (low Hgb, copd). Expect pt will make good progress and be able to return home. Will follow acutely but doubt will need PT after DC.    Follow Up Recommendations No PT follow up    Equipment Recommendations  None recommended by PT    Recommendations for Other Services       Precautions / Restrictions Precautions Precautions: None Restrictions Weight Bearing Restrictions: No      Mobility  Bed Mobility Overal bed mobility: Independent                Transfers Overall transfer level: Modified independent Equipment used: 4-wheeled walker                Ambulation/Gait Ambulation/Gait assistance: Supervision Gait Distance (Feet): 80 Feet Assistive device: 4-wheeled walker Gait Pattern/deviations: Step-through pattern;Decreased stride length Gait velocity: decr Gait velocity interpretation: 1.31 - 2.62 ft/sec, indicative of limited community ambulator General Gait Details: supervision for safety. Pt denied light headedness  Stairs            Wheelchair Mobility    Modified Rankin (Stroke Patients Only)       Balance Overall balance assessment: No apparent balance deficits (not formally assessed)                                           Pertinent Vitals/Pain Pain Assessment: No/denies pain    Home Living Family/patient expects to be discharged to:: Private residence Living Arrangements: Children   Type of Home: House Home Access:  Level entry     Home Layout: One level Home Equipment: Environmental consultant - 4 wheels;Shower seat;Bedside commode Additional Comments: Pt on home O2 24/7 at 4L.     Prior Function Level of Independence: Independent with assistive device(s)         Comments: Uses rollator if our in community stores that don't have electric carts available     Hand Dominance        Extremity/Trunk Assessment   Upper Extremity Assessment Upper Extremity Assessment: Overall WFL for tasks assessed    Lower Extremity Assessment Lower Extremity Assessment: Generalized weakness       Communication   Communication: No difficulties  Cognition Arousal/Alertness: Awake/alert Behavior During Therapy: WFL for tasks assessed/performed Overall Cognitive Status: Within Functional Limits for tasks assessed                                        General Comments General comments (skin integrity, edema, etc.): Pt amb on 4L of O2 with SpO2 >90% after amb    Exercises     Assessment/Plan    PT Assessment Patient needs continued PT services  PT Problem List Decreased activity tolerance;Decreased mobility;Cardiopulmonary status limiting activity       PT Treatment Interventions DME instruction;Gait training;Functional mobility training;Therapeutic activities;Therapeutic exercise;Patient/family education  PT Goals (Current goals can be found in the Care Plan section)  Acute Rehab PT Goals Patient Stated Goal: return home PT Goal Formulation: With patient Time For Goal Achievement: 12/13/17 Potential to Achieve Goals: Good    Frequency Min 3X/week   Barriers to discharge        Co-evaluation               AM-PAC PT "6 Clicks" Daily Activity  Outcome Measure Difficulty turning over in bed (including adjusting bedclothes, sheets and blankets)?: None Difficulty moving from lying on back to sitting on the side of the bed? : None Difficulty sitting down on and standing up from a  chair with arms (e.g., wheelchair, bedside commode, etc,.)?: None Help needed moving to and from a bed to chair (including a wheelchair)?: None Help needed walking in hospital room?: A Little Help needed climbing 3-5 steps with a railing? : A Little 6 Click Score: 22    End of Session Equipment Utilized During Treatment: Oxygen Activity Tolerance: Patient limited by fatigue Patient left: in bed;with call bell/phone within reach;with family/visitor present(sitting EOB) Nurse Communication: Mobility status PT Visit Diagnosis: Difficulty in walking, not elsewhere classified (R26.2)    Time: 5015-8682 PT Time Calculation (min) (ACUTE ONLY): 22 min   Charges:   PT Evaluation $PT Eval Moderate Complexity: [place] Pager [phone] Office [place] 12/06/2017, 4:24 PM

## 2017-12-07 LAB — BASIC METABOLIC PANEL
Anion gap: 6 (ref 5–15)
BUN: 12 mg/dL (ref 8–23)
CHLORIDE: 111 mmol/L (ref 98–111)
CO2: 21 mmol/L — ABNORMAL LOW (ref 22–32)
CREATININE: 1.21 mg/dL — AB (ref 0.44–1.00)
Calcium: 8 mg/dL — ABNORMAL LOW (ref 8.9–10.3)
GFR, EST AFRICAN AMERICAN: 51 mL/min — AB (ref >60)
GFR, EST NON AFRICAN AMERICAN: 44 mL/min — AB (ref >60)
Glucose, Bld: 96 mg/dL (ref 70–99)
POTASSIUM: 3.7 mmol/L (ref 3.5–5.1)
SODIUM: 138 mmol/L (ref 135–145)

## 2017-12-07 LAB — CBC
HCT: 23.2 % — ABNORMAL LOW (ref 36.0–46.0)
Hemoglobin: 7.3 g/dL — ABNORMAL LOW (ref 12.0–15.0)
MCH: 30.9 pg (ref 26.0–34.0)
MCHC: 31.5 g/dL (ref 30.0–36.0)
MCV: 98.3 fL (ref 78.0–100.0)
PLATELETS: 183 10*3/uL (ref 150–400)
RBC: 2.36 MIL/uL — AB (ref 3.87–5.11)
RDW: 19.6 % — AB (ref 11.5–15.5)
WBC: 12.7 10*3/uL — AB (ref 4.0–10.5)

## 2017-12-07 LAB — HEPARIN LEVEL (UNFRACTIONATED): HEPARIN UNFRACTIONATED: 0.35 [IU]/mL (ref 0.30–0.70)

## 2017-12-07 MED ORDER — FAMOTIDINE 20 MG PO TABS
20.0000 mg | ORAL_TABLET | Freq: Every day | ORAL | Status: DC
  Administered 2017-12-07 – 2017-12-08 (×2): 20 mg via ORAL
  Filled 2017-12-07 (×2): qty 1

## 2017-12-07 MED ORDER — APIXABAN 5 MG PO TABS
5.0000 mg | ORAL_TABLET | Freq: Two times a day (BID) | ORAL | Status: DC
  Administered 2017-12-07 – 2017-12-08 (×3): 5 mg via ORAL
  Filled 2017-12-07 (×3): qty 1

## 2017-12-07 NOTE — Progress Notes (Signed)
[place] for Heparin Indication: atrial fibrillation  Allergies  Allergen Reactions  . Amiodarone     Dyspnea - felt to be 2/2 amio lung toxicity Has tolerated Omnipaque   . Zyban [Bupropion] Itching    Patient Measurements: Height: _0  (160 cm) Weight: 190 lb 6.4 oz (86.4 kg) IBW/kg (Calculated) : 52.4 Heparin Dosing Weight: 71 kg  Vital Signs: Temp: 98.3 F (36.8 C) (09/02 0453) Temp Source: Oral (09/02 0453) BP: 90/69 (09/02 0453) Pulse Rate: 67 (09/02 0453)  Labs: Recent Labs    12/06/17 0044 12/06/17 0549 12/06/17 1355 12/07/17 0442  HGB 7.2* 7.0*  --  7.3*  HCT 22.9* 22.4*  --  23.2*  PLT 182 175  --  183  HEPARINUNFRC  --  0.72* 0.58 0.35  CREATININE 1.31* 1.31*  --  1.21*    Estimated Creatinine Clearance: 43.8 mL/min (A) (by C-G formula based on SCr of 1.21 mg/dL (H)).   Medical History: Past Medical History:  Diagnosis Date  . Apical variant hypertrophic cardiomyopathy ([place])    Diagnosed by echo and ECG 11/13  . Atypical atrial flutter ([place])   . CAD (coronary artery disease)    a. LHC in 10/2011 demonstrated non-obstructive disease and an 80% lesion in a small D1 treated medically.   . CHF (congestive heart failure) ([place])   . Chronic diastolic heart failure ([place])   . CKD (chronic kidney disease), stage III ([place])   . COPD (chronic obstructive pulmonary disease) ([place])   . Dizziness    had PT for being off balance - in approx. 2012  . GERD (gastroesophageal reflux disease)   . Hyperlipidemia   . Hypertension   . Hypothyroid   . Lumbar disc disease   . Macular degeneration   . OSA (obstructive sleep apnea)   . Persistent atrial fibrillation ([place])       . Pulmonary hypertension ([place])    a. [place] 05/2013 showed mild PAH with normal PCWP and RA pressure, could be related to OSA and low oxygen saturation.   . Refusal of blood transfusions as patient is Jehovah's Witness   . Sinus bradycardia      Assessment: 72 yoF on Xarelto PTA for AFib admitted for GI bleeding. Pt now s/p colonoscopy with no active bleeding. Initial plan was to hold anticoagulation for 7 days, but pt now with questionable TIA so pharmacy consulted to start IV heparin with no bolus. CT head negative for acute hemorrhage. Last dose of Xarelto was 8/24 so will dose with heparin levels.  9/2 - Hgb low but stable.  Pharmacy asked to transition IV heparin to po Eliquis today.  Goal of Therapy:   Monitor platelets by anticoagulation protocol: Yes   Plan:  -Eliquis 5 mg po BID. -D/c heparin infusion at time of first Eliquis dose. -Will need education on Eliquis prior to discharge.  Marguerite Olea, [facility] Clinical Pharmacist Phone [phone]  12/07/2017 8:32 AM

## 2017-12-07 NOTE — Progress Notes (Signed)
STROKE TEAM PROGRESS NOTE      SUBJECTIVE (INTERVAL HISTORY) No family members in the room. The patient is doing well and likely going home.She has not had any recurrent TIA or stroke symptoms   OBJECTIVE Vitals:   12/06/17 2046 12/07/17 0027 12/07/17 0453 12/07/17 0851  BP:  (!) 94/53 90/69   Pulse: 61 65 67 67  Resp: 20 18 (!) 24 16  Temp:  98.1 F (36.7 C) 98.3 F (36.8 C)   TempSrc:  Oral Oral   SpO2: 100% 90% 100% 92%  Weight:   86.4 kg   Height:   _0  (1.6 m)     CBC:  Recent Labs  Lab 12/06/17 0549 12/07/17 0442  WBC 11.8* 12.7*  HGB 7.0* 7.3*  HCT 22.4* 23.2*  MCV 97.8 98.3  PLT 175 185    Basic Metabolic Panel:  Recent Labs  Lab 12/06/17 0549 12/07/17 0442  NA 138 138  K 4.2 3.7  CL 111 111  CO2 21* 21*  GLUCOSE 112* 96  BUN 12 12  CREATININE 1.31* 1.21*  CALCIUM 8.0* 8.0*    Lipid Panel:     Component Value Date/Time   CHOL 105 09/12/2016 1204   TRIG 76 09/12/2016 1204   HDL 41 09/12/2016 1204   CHOLHDL 2.6 09/12/2016 1204   CHOLHDL 3 09/27/2014 0946   VLDL 15.2 09/27/2014 0946   LDLCALC 49 09/12/2016 1204   HgbA1c:  Lab Results  Component Value Date   HGBA1C 6.1 (H) 10/28/2014   Urine Drug Screen: No results found for: LABOPIA, COCAINSCRNUR, LABBENZ, AMPHETMU, THCU, LABBARB  Alcohol Level No results found for: ETH  IMAGING   Ct Head Wo Contrast 12/05/2017 IMPRESSION:  Atrophy and chronic microvascular ischemic changes in the white matter. No acute infarct or hemorrhage.    Mr Brain Wo Contrast 12/05/2017 IMPRESSION:  Negative for acute infarct. Mild chronic microvascular ischemia in the white matter. Diffuse vascular abnormality most consistent with intravenous iron infusion therapy. Correlate with history.     Transthoracic Echocardiogram with Bubble Study (performed secondary to CHF) 09/08/2017 Study Conclusions - Left ventricle: The cavity size was normal. Wall thickness was   increased in a pattern of moderate LVH.  Thickening of the LV apex   consistent with apical variant HCM. Systolic function was   vigorous. The estimated ejection fraction was in the range of 65%   to 70%. Wall motion was normal; there were no regional wall   motion abnormalities. Doppler parameters are consistent with   restrictive left ventricular relaxation (grade 3 diastolic   dysfunction). The E/A ratio is >2. The E/e&' ratio is >20,   suggesting markedly elevated LV filling pressure. - Mitral valve: Calcified annulus. Mildly thickened leaflets .   There was mild regurgitation. - Left atrium: Severely dilated. - Atrial septum: Septum bows left to right, suggesting high LA   pressure. Negative for PFO or late shunting by saline microbubble   contrast. - Pulmonary arteries: PA peak pressure: 42 mm Hg (S). - Pericardium, extracardiac: A trivial pericardial effusion was   identified posterior to the heart. Features were not consistent   with tamponade physiology. Impressions: - Compared with a prior study in 06/2017, the LVEF is unchanged. The   degree of diastolic dysfunction is worse with a higher LV filling   pressure.    Bilateral Carotid Dopplers - 1-39% ICA stenosis. Vertebral artery flow is antegrade    PHYSICAL EXAM Blood pressure 90/69, pulse 67, temperature 98.3 F (36.8  C), temperature source Oral, resp. rate 16, height _0  (1.6 m), weight 86.4 kg, SpO2 92 %.    Pleasant obese elderly Caucasian lady not in distress. . Afebrile. Head is nontraumatic. Neck is supple without bruit.    Cardiac exam no murmur or gallop. Lungs are clear to auscultation. Distal pulses are well felt.  Neurological Exam ;  Awake  Alert oriented x 3. Normal speech and language.eye movements full without nystagmus.fundi were not visualized. Vision acuity and fields appear normal. Hearing is normal. Palatal movements are normal. Face symmetric. Tongue midline. Normal strength, tone, reflexes and coordination. Normal sensation. Gait  deferred.       ASSESSMENT/PLAN [name] with history of atrial fibrillation (Eliquis stopped secondary to GI bleed), pulmonary hypertension, hypertension, hyperlipidemia, hypothyroidism, COPD, chronic kidney disease, congestive heart failure, and coronary artery disease presenting with Gi bleed and subsequent speech difficulties. She did not receive IV t-PA due to Gi bleed.   Possible TIA:    Resultant - resolution of deficits.  CT head - No acute infarct or hemorrhage. Atrophy and chronic microvascular ischemic changes in the white matter.   MRI head - Negative for acute infarct. Diffuse vascular abnormality most consistent with intravenous iron infusion therapy. Correlate with history.   MRA head - not performed  Carotid Doppler - 1-39% ICA stenosis. Vertebral artery flow is antegrade  2D Echo - EF 65% to 70%  LDL - 49  HgbA1c - 6.1  VTE prophylaxis - IV Heaparin  Diet - Heart healthy / carb modified with thin liquids.  Eliquis (apixaban) daily prior to admission, now on heparin IV  Patient counseled to be compliant with her antithrombotic medications  Ongoing aggressive stroke risk factor management  Therapy recommendations:  none   Disposition:  home Hypertension  Mildly low at times . Permissive hypertension (OK if < 220/120) but gradually normalize in 5-7 days . Long-term BP goal normotensive  Hyperlipidemia  Lipid lowering medication PTA:  Lipitor 80 mg daily and Zetia 10 mg daily  LDL 49, goal < 70  Current lipid lowering medication:   Lipitor 80 mg daily and Zetia 10 mg daily  Continue statin at discharge    Other Stroke Risk Factors  Advanced age  Former cigarette smoker - quit  Obesity, Body mass index is 33.73 kg/m., recommend weight loss, diet and exercise as appropriate   Coronary artery disease  Atrial fibrillation  OSA on C-pap   Other Active Problems  Recent Gi bleed  Anemia - Hb  7.0  Jehovah's Witness - does not want blood transfusions.  Mild hypotension  Mild leukocytosis  CKD  Afib - Eliquis DC'd now on IV Heparin.       Hospital day # 7   She presented with a GI hemorrhage which was thought to be due to diverticulitis and has undergone colonoscopy and GI feels she may be started back on anticoagulation.  She has been started on IV heparin and and so far has tolerated this without recurrent bleeding hence I recommend starting anticoagulation with eliquis if okay with gastroenterologist. Discuss risk benefit of anticoagulation and stroke prevention with the patient and overall I believe it may be in the patient's interest to start anticoagulation as the risk of stroke is higher than the risk of bleeding. Discussed with patient and Dr. Jerald Kief.  Greater than 50% time during this 25-minute visit was spent on counseling and coordination of care about atrial fibrillation and stroke risk  and risk benefit of anticoagulation and discussion about bleeding risk and answering questions.Stroke team will sign off. Kindly call for questions if any. Antony Contras, MD Medical Director [facility] Pager: [phone] 12/07/2017 12:56 PM   To contact Stroke Continuity provider, please refer to [email or web address] After hours, contact General Neurology

## 2017-12-07 NOTE — Progress Notes (Signed)
Reducing famotidine to 67m qday due to crcl<50.  MOnnie Boer PharmD, [place] AAHIVP, CPP Infectious Disease Pharmacist Pager: [phone]/05/2017 8:38 AM

## 2017-12-07 NOTE — Progress Notes (Signed)
PROGRESS NOTE    Amy [name]  KWI:[id number] DOB: December 13, 1945 DOA: 11/29/2017 PCP: Aretta Nip, MD    Brief Narrative: Amy [name] a 72 y.o.femalewith medical history significant forparoxysmal atrial fibrillation on Xarelto, hypertension, hyperlipidemia, hypothyroidism diastolic heart failure, CAD, with catheterization in October 07, 2017, nonobstructive CAD medical management, OSA on CPAP, as well as apical H COM by cardiac MRI, with mild to moderate pulmonary hypertension on chronic O2 4L [place], on CellCept, OPSUMIT Uptravi, followed by cardiology and pulmonology,presenting to the emergency department with bright red blood per rectum.  Gastroenterology was consulted.  Assessment & Plan:   Active Problems:   Asthmatic bronchitis , chronic (HCC)   Hypothyroidism   Essential hypertension   Apical variant hypertrophic cardiomyopathy (HCC)   Chronic diastolic heart failure (HCC)   OSA (obstructive sleep apnea)   CAD (coronary artery disease)   PAF (paroxysmal atrial fibrillation) (HCC)   Chronic respiratory failure with hypoxia (HCC)   GIB (gastrointestinal bleeding)   TIA; Expressive aphasia, weakness.  Per daughter patient had  difficulty expressing herself, not talking in complete sentences, around 2;40. Also patient now report that when I saw her this morning she couldn't  understand what I was telling her. She answer questions to me in the morning.  Patient feels weak, tired. She was very weak getting from bedside commode to bed.  Patient is alert, she is able to answer questions. She feels tired. Per daughter patient is now more improved but not herself.  -Will check CT head stat.  -Neurology Consulted.  -Patient is high risk for TPA due to recent GI bleed and she refuse blood product.  -Discussed with Dr Lorraine Lax. Will start heparin gtt , monitor for evidence of bleeding and transition to oral anticoagulation if she tolerates it.  Feeling better.  She has  been on heparin for 30 hours. No recurrence of bleeding. Hb increase to 7.3. Plan to change heparin to eliquis. Risk benefit , discussed with patient. She understand risk for bleeding and will proceed to take eliquis.    Acute lower GI bleed; Jehovah's Witness, Decline blood product  Transfer to step down 8-26 due to hypotension and persistent GI bleed.  GI consulted and following, S/P Flex sig on 8-26 unrevealing.  Nuclear scan did not showed source of bleeding.  Evaluated by cardiology high risk for procedure, but not prohibited.  Received IV iron 8-28 Continue with  B 12 and folic acid.  Hb stable today at 7.6. Per nurse stool more brownish.  Colonoscopy today. Oxygen sat stable on chronic 4 L.  Hb stable.  Had TIA/  Evaluated by neurology who recommended start anticoagulation.  Transition to eliquis. Hb stable , increase to 7.3.  erythropoietin level pending.   Chronic diastolic CHF / Pulmonary hypertension / Chronic hypoxic respiratory failure On chronic 4 L oxygen.  Holding lasix due to soft BP.  Stable.  Weight stable at 86 Kg  Plan to resume  lasix tomorrow.   CAD; cath July 2019, non obstructive CAD  H COM by cardiac MRI, with mild to moderate pulmonary hypertension, P-ANCA+, CCP+, RO-52+ Holding Cellcept.    PAF ; start eliquis today    CKD 3 Stable.  Mild elevation of K, mild hemolysis   HLD;  On statins.   Hypothyroidism;  Continue with synthroid.   Transient numbness finger;  Monitor, rest neuro exam unremarkable.  Resolved./   DVT prophylaxis: scd Code Status:  Full code.  Family Communication: care discussed with patient.  Disposition Plan: home in 24 hours if hb stable.   Consultants:   GI  Cardiology    Procedures:   Flex    Antimicrobials:  none   Subjective: No further bleeding, stool brown.  Denies pain./  No further episodes of confusion   Objective: Vitals:   12/06/17 2041 12/06/17 2046 12/07/17 0027 12/07/17 0453  BP:    (!) 94/53 90/69  Pulse:  61 65 67  Resp:  20 18 (!) 24  Temp:   98.1 F (36.7 C) 98.3 F (36.8 C)  TempSrc:   Oral Oral  SpO2: 99% 100% 90% 100%  Weight:    86.4 kg  Height:    _0  (1.6 m)    Intake/Output Summary (Last 24 hours) at 12/07/2017 0825 Last data filed at 12/07/2017 1610 Gross per 24 hour  Intake 204.09 ml  Output 1700 ml  Net -1495.91 ml   Filed Weights   12/05/17 0500 12/06/17 0448 12/07/17 0453  Weight: 86.7 kg 86.5 kg 86.4 kg    Examination:  General exam: NAD Respiratory system: CTA Cardiovascular system: S 1, S 2 RRR Gastrointestinal system: BS present, soft, nt Central nervous system: Alert. Non focal.  Extremities: no edema Skin: No rashes    Data Reviewed: I have personally reviewed following labs and imaging studies  CBC: Recent Labs  Lab 12/04/17 0400 12/05/17 0459 12/06/17 0044 12/06/17 0549 12/07/17 0442  WBC 10.6* 11.8* 12.8* 11.8* 12.7*  HGB 7.4* 7.3* 7.2* 7.0* 7.3*  HCT 22.9* 23.2* 22.9* 22.4* 23.2*  MCV 95.8 97.5 97.4 97.8 98.3  PLT 181 194 182 175 960   Basic Metabolic Panel: Recent Labs  Lab 12/04/17 0400 12/05/17 0459 12/06/17 0044 12/06/17 0549 12/07/17 0442  NA 137 139 138 138 138  K 4.3 5.3* 3.9 4.2 3.7  CL 113* 114* 111 111 111  CO2 20* 21* 23 21* 21*  GLUCOSE 115* 102* 126* 112* 96  BUN 7* _1 CREATININE 1.09* 1.31* 1.31* 1.31* 1.21*  CALCIUM 8.2* 8.1* 8.2* 8.0* 8.0*   GFR: Estimated Creatinine Clearance: 43.8 mL/min (A) (by C-G formula based on SCr of 1.21 mg/dL (H)). Liver Function Tests: No results for input(s): AST, ALT, ALKPHOS, BILITOT, PROT, ALBUMIN in the last 168 hours. No results for input(s): LIPASE, AMYLASE in the last 168 hours. No results for input(s): AMMONIA in the last 168 hours. Coagulation Profile: No results for input(s): INR, PROTIME in the last 168 hours. Cardiac Enzymes: No results for input(s): CKTOTAL, CKMB, CKMBINDEX, TROPONINI in the last 168 hours. BNP (last 3  results) No results for input(s): PROBNP in the last 8760 hours. HbA1C: No results for input(s): HGBA1C in the last 72 hours. CBG: Recent Labs  Lab 11/30/17 1129 12/05/17 1528  GLUCAP 78 89   Lipid Profile: No results for input(s): CHOL, HDL, LDLCALC, TRIG, CHOLHDL, LDLDIRECT in the last 72 hours. Thyroid Function Tests: No results for input(s): TSH, T4TOTAL, FREET4, T3FREE, THYROIDAB in the last 72 hours. Anemia Panel: Recent Labs    12/04/17 1302  FERRITIN 205  TIBC 251  IRON 382*   Sepsis Labs: No results for input(s): PROCALCITON, LATICACIDVEN in the last 168 hours.  Recent Results (from the past 240 hour(s))  C difficile quick scan w PCR reflex     Status: None   Collection Time: 11/29/17  8:12 AM  Result Value Ref Range Status   C Diff antigen NEGATIVE NEGATIVE Final   C Diff toxin NEGATIVE NEGATIVE Final  C Diff interpretation No C. difficile detected.  Final    Comment: Performed at [facility], [place] [address] [place], [place] [address]  Gastrointestinal Panel by PCR , Stool     Status: None   Collection Time: 11/29/17  8:12 AM  Result Value Ref Range Status   Campylobacter species NOT DETECTED NOT DETECTED Final   Plesimonas shigelloides NOT DETECTED NOT DETECTED Final   Salmonella species NOT DETECTED NOT DETECTED Final   Yersinia enterocolitica NOT DETECTED NOT DETECTED Final   Vibrio species NOT DETECTED NOT DETECTED Final   Vibrio cholerae NOT DETECTED NOT DETECTED Final   Enteroaggregative E coli (EAEC) NOT DETECTED NOT DETECTED Final   Enteropathogenic E coli (EPEC) NOT DETECTED NOT DETECTED Final   Enterotoxigenic E coli (ETEC) NOT DETECTED NOT DETECTED Final   Shiga like toxin producing E coli (STEC) NOT DETECTED NOT DETECTED Final   Shigella/Enteroinvasive E coli (EIEC) NOT DETECTED NOT DETECTED Final   Cryptosporidium NOT DETECTED NOT DETECTED Final   Cyclospora cayetanensis NOT DETECTED NOT DETECTED Final   Entamoeba histolytica NOT  DETECTED NOT DETECTED Final   Giardia lamblia NOT DETECTED NOT DETECTED Final   Adenovirus F40/41 NOT DETECTED NOT DETECTED Final   Astrovirus NOT DETECTED NOT DETECTED Final   Norovirus GI/GII NOT DETECTED NOT DETECTED Final   Rotavirus A NOT DETECTED NOT DETECTED Final   Sapovirus (I, II, IV, and V) NOT DETECTED NOT DETECTED Final    Comment: Performed at [facility], [address] [place], [place] [address]  MRSA PCR Screening     Status: None   Collection Time: 11/30/17 12:23 PM  Result Value Ref Range Status   MRSA by PCR NEGATIVE NEGATIVE Final    Comment:        The GeneXpert MRSA Assay (FDA approved for NASAL specimens only), is one component of a comprehensive MRSA colonization surveillance program. It is not intended to diagnose MRSA infection nor to guide or monitor treatment for MRSA infections. Performed at [facility], [place] [address], [place], [place] [address]          Radiology Studies: Ct Head Wo Contrast  Result Date: 12/05/2017 CLINICAL DATA:  Speech abnormality EXAM: CT HEAD WITHOUT CONTRAST TECHNIQUE: Contiguous axial images were obtained from the base of the skull through the vertex without intravenous contrast. COMPARISON:  None. FINDINGS: Brain: Mild to moderate atrophy. Patchy hypodensity throughout the cerebral white matter bilaterally appears chronic. Negative for acute infarct, hemorrhage, or mass Vascular: Negative for hyperdense vessel Skull: Negative Sinuses/Orbits: paranasal sinuses clear. Bilateral cataract surgery. Other: None IMPRESSION: Atrophy and chronic microvascular ischemic changes in the white matter. No acute infarct or hemorrhage. Electronically Signed   By: Franchot Gallo M.D.   On: 12/05/2017 17:00   Mr Brain Wo Contrast  Result Date: 12/05/2017 CLINICAL DATA:  Speech difficulty EXAM: MRI HEAD WITHOUT CONTRAST TECHNIQUE: Multiplanar, multiecho pulse sequences of the brain and surrounding structures were obtained  without intravenous contrast. COMPARISON:  CT head 12/05/2017 FINDINGS: Brain: Negative for acute infarct. Mild chronic microvascular ischemic change in the white matter. Ventricle size normal. Cerebral volume normal. Vascular: T1 shortening of the arteries and veins diffusely in the brain. Patient did not receive intravenous gadolinium. Vessels are markedly low signal on gradient echo imaging. This is most consistent with intravenous iron infusion therapy. The patient does have GI bleeding and anemia. Correlate with history of iron fusion. Skull and upper cervical spine: Cervical spondylosis. Sinuses/Orbits: Mild mucosal edema  paranasal sinuses. Mild mucosal edema in the mastoid sinus bilaterally. Bilateral cataract surgery Other: None IMPRESSION: Negative for acute infarct. Mild chronic microvascular ischemia in the white matter. Diffuse vascular abnormality most consistent with intravenous iron infusion therapy. Correlate with history. Electronically Signed   By: Franchot Gallo M.D.   On: 12/05/2017 18:03        Scheduled Meds: . atorvastatin  80 mg Oral Q lunch  . colchicine  0.6 mg Oral Daily  . dronedarone  400 mg Oral BID WC  . ezetimibe  10 mg Oral Daily  . ferrous sulfate  325 mg Oral BID WC  . folic acid  1 mg Oral Daily  . ipratropium-albuterol  3 mL Nebulization TID  . levothyroxine  25 mcg Oral QAC breakfast  . macitentan  10 mg Oral Daily  . predniSONE  10 mg Oral BID WC  . Selexipag  1,600 mcg Oral BID  . vitamin B-12  100 mcg Oral Daily   Continuous Infusions: . famotidine (PEPCID) IV 20 mg (12/06/17 2157)  . heparin 600 Units/hr (12/07/17 0449)     LOS: 7 days    Time spent: 35 minutes.     Elmarie Shiley, MD [facility] Pager [phone]  If 7PM-7AM, please contact night-coverage [email or web address] Password [email or web address] 12/07/2017, 8:25 AM

## 2017-12-07 NOTE — Progress Notes (Signed)
[place] for Heparin Indication: atrial fibrillation  Allergies  Allergen Reactions  . Amiodarone     Dyspnea - felt to be 2/2 amio lung toxicity Has tolerated Omnipaque   . Zyban [Bupropion] Itching    Patient Measurements: Height: _0  (160 cm) Weight: 190 lb 6.4 oz (86.4 kg) IBW/kg (Calculated) : 52.4 Heparin Dosing Weight: 71 kg  Vital Signs: Temp: 98.3 F (36.8 C) (09/02 0453) Temp Source: Oral (09/02 0453) BP: 90/69 (09/02 0453) Pulse Rate: 67 (09/02 0453)  Labs: Recent Labs    12/05/17 0459 12/06/17 0044 12/06/17 0549 12/06/17 1355 12/07/17 0442  HGB 7.3* 7.2* 7.0*  --  7.3*  HCT 23.2* 22.9* 22.4*  --  23.2*  PLT 194 182 175  --  183  HEPARINUNFRC  --   --  0.72* 0.58 0.35  CREATININE 1.31* 1.31* 1.31*  --   --     Estimated Creatinine Clearance: 40.4 mL/min (A) (by C-G formula based on SCr of 1.31 mg/dL (H)).   Medical History: Past Medical History:  Diagnosis Date  . Apical variant hypertrophic cardiomyopathy ([place])    Diagnosed by echo and ECG 11/13  . Atypical atrial flutter ([place])   . CAD (coronary artery disease)    a. LHC in 10/2011 demonstrated non-obstructive disease and an 80% lesion in a small D1 treated medically.   . CHF (congestive heart failure) ([place])   . Chronic diastolic heart failure ([place])   . CKD (chronic kidney disease), stage III ([place])   . COPD (chronic obstructive pulmonary disease) ([place])   . Dizziness    had PT for being off balance - in approx. 2012  . GERD (gastroesophageal reflux disease)   . Hyperlipidemia   . Hypertension   . Hypothyroid   . Lumbar disc disease   . Macular degeneration   . OSA (obstructive sleep apnea)   . Persistent atrial fibrillation ([place])       . Pulmonary hypertension ([place])    a. [place] 05/2013 showed mild PAH with normal PCWP and RA pressure, could be related to OSA and low oxygen saturation.   . Refusal of blood transfusions as patient is Jehovah's Witness    . Sinus bradycardia     Assessment: 72 yoF on Xarelto PTA for AFib admitted for GI bleeding. Pt now s/p colonoscopy with no active bleeding. Initial plan was to hold anticoagulation for 7 days, but pt now with questionable TIA so pharmacy consulted to start IV heparin with no bolus. CT head negative for acute hemorrhage. Last dose of Xarelto was 8/24 so will dose with heparin levels.  9/2 AM update: heparin level therapeutic x 1 after rate decrease, Hgb in the 7-7.5 range for the last 4-5 days  Goal of Therapy:  Heparin level 0.3-0.5 units/ml Monitor platelets by anticoagulation protocol: Yes   Plan:  -Cont heparin at 600 units/hr -1200 heparin level -Monitor closely for S/Sx bleeding, trend Hgb  Narda Bonds, PharmD, [place] Pharmacist Phone: [phone]

## 2017-12-08 ENCOUNTER — Ambulatory Visit: Payer: Medicare Other | Admitting: Cardiology

## 2017-12-08 DIAGNOSIS — F0631 Mood disorder due to known physiological condition with depressive features: Secondary | ICD-10-CM | POA: Diagnosis not present

## 2017-12-08 LAB — CBC
HEMATOCRIT: 24.9 % — AB (ref 36.0–46.0)
Hemoglobin: 7.8 g/dL — ABNORMAL LOW (ref 12.0–15.0)
MCH: 31.1 pg (ref 26.0–34.0)
MCHC: 31.3 g/dL (ref 30.0–36.0)
MCV: 99.2 fL (ref 78.0–100.0)
Platelets: 171 10*3/uL (ref 150–400)
RBC: 2.51 MIL/uL — ABNORMAL LOW (ref 3.87–5.11)
RDW: 20.5 % — ABNORMAL HIGH (ref 11.5–15.5)
WBC: 13.6 10*3/uL — AB (ref 4.0–10.5)

## 2017-12-08 LAB — ERYTHROPOIETIN: Erythropoietin: 262.8 m[IU]/mL — ABNORMAL HIGH (ref 2.6–18.5)

## 2017-12-08 MED ORDER — DRONEDARONE HCL 400 MG PO TABS: 400.0000 mg | ORAL_TABLET | Freq: Two times a day (BID) | ORAL | 0 refills | Status: DC

## 2017-12-08 MED ORDER — CYANOCOBALAMIN 100 MCG PO TABS: 100.0000 ug | ORAL_TABLET | Freq: Every day | ORAL | 0 refills | Status: DC

## 2017-12-08 MED ORDER — APIXABAN 5 MG PO TABS: 5.0000 mg | ORAL_TABLET | Freq: Two times a day (BID) | ORAL | 1 refills | Status: DC

## 2017-12-08 MED ORDER — FERROUS SULFATE 325 (65 FE) MG PO TABS: 325.0000 mg | ORAL_TABLET | Freq: Two times a day (BID) | ORAL | 3 refills | Status: DC

## 2017-12-08 MED ORDER — FOLIC ACID 1 MG PO TABS: 1.0000 mg | ORAL_TABLET | Freq: Every day | ORAL | 0 refills | Status: DC

## 2017-12-08 MED ORDER — PREDNISONE 10 MG PO TABS: 10.0000 mg | ORAL_TABLET | Freq: Two times a day (BID) | ORAL | 0 refills | Status: DC

## 2017-12-08 NOTE — Care Management Note (Signed)
Case Management Note  Patient Details  Name: Amy [name] MRN: [id number] Date of Birth: Nov 15, 1945  Subjective/Objective: Pt presented for Acute lower GI Bleed. PTA from home with support of daughters. Per patient she was hiring out [employer] out of pocket cost.                     Action/Plan: No Home needs identfied at this time.   Expected Discharge Date:  12/08/17               Expected Discharge Plan:  Home/Self Care  In-House Referral:  NA  Discharge planning Services  CM Consult  Post Acute Care Choice:  NA Choice offered to:  NA  DME Arranged:  N/A DME Agency:  NA  HH Arranged:  NA HH Agency:  NA  Status of Service:  Completed, signed off  If discussed at [place] of Stay Meetings, dates discussed:    Additional Comments:  Bethena Roys, RN 12/08/2017, 11:50 AM

## 2017-12-08 NOTE — Progress Notes (Signed)
Physical Therapy Treatment Patient Details Name: Amy [name] MRN: [id number] DOB: [birth year] Today's Date: 12/08/2017    History of Present Illness Pt adm with GI bleed. Currently pt with Hgb of 7.0. Jehovah's Witness so no blood transfusion. Pt also had two TIA's affecting speech during hospitalization. CT and MRI negative for acute infarct. PMH - copd, pulmonary HTN, CAD, HTN, afib, chf    PT Comments    Pt admitted with above diagnosis. Pt currently with functional limitations due to balance and endurance deficits. Pt was able to ambulate with 4LO2 and RW with supervision.  Sats >90%.  Pt going home today.  Pt will benefit from skilled PT to increase their independence and safety with mobility to allow discharge to the venue listed below.     Follow Up Recommendations  No PT follow up     Equipment Recommendations  None recommended by PT    Recommendations for Other Services       Precautions / Restrictions Precautions Precautions: None Restrictions Weight Bearing Restrictions: No    Mobility  Bed Mobility Overal bed mobility: Independent                Transfers Overall transfer level: Modified independent Equipment used: Rolling walker (2 wheeled)                Ambulation/Gait Ambulation/Gait assistance: Supervision Gait Distance (Feet): 150 Feet Assistive device: Rolling walker (2 wheeled) Gait Pattern/deviations: Step-through pattern;Decreased stride length Gait velocity: decr Gait velocity interpretation: 1.31 - 2.62 ft/sec, indicative of limited community ambulator General Gait Details: supervision for safety. Pt denied light headedness.  O2 sats on 4L >90%   Stairs             Wheelchair Mobility    Modified Rankin (Stroke Patients Only)       Balance Overall balance assessment: No apparent balance deficits (not formally assessed)                                          Cognition Arousal/Alertness:  Awake/alert Behavior During Therapy: WFL for tasks assessed/performed Overall Cognitive Status: Within Functional Limits for tasks assessed                                        Exercises General Exercises - Lower Extremity Ankle Circles/Pumps: AROM;Both;10 reps;Seated Long Arc Quad: AROM;Both;10 reps;Seated    General Comments        Pertinent Vitals/Pain Pain Assessment: No/denies pain    Home Living                      Prior Function            PT Goals (current goals can now be found in the care plan section) Acute Rehab PT Goals Patient Stated Goal: return home Progress towards PT goals: Progressing toward goals    Frequency    Min 3X/week      PT Plan Current plan remains appropriate    Co-evaluation              AM-PAC PT "6 Clicks" Daily Activity  Outcome Measure  Difficulty turning over in bed (including adjusting bedclothes, sheets and blankets)?: None Difficulty moving from lying on back to sitting on the side of the bed? :  None Difficulty sitting down on and standing up from a chair with arms (e.g., wheelchair, bedside commode, etc,.)?: None Help needed moving to and from a bed to chair (including a wheelchair)?: None Help needed walking in hospital room?: A Little Help needed climbing 3-5 steps with a railing? : A Little 6 Click Score: 22    End of Session Equipment Utilized During Treatment: Oxygen;Gait belt Activity Tolerance: Patient limited by fatigue Patient left: with call bell/phone within reach;in chair(sitting EOB) Nurse Communication: Mobility status PT Visit Diagnosis: Difficulty in walking, not elsewhere classified (R26.2)     Time: 2158-7276 PT Time Calculation (min) (ACUTE ONLY): 21 min  Charges:  $Gait Training: 8-22 mins                     [place] Pager:  [phone]  Office:  [place] 12/08/2017, 12:04 PM

## 2017-12-08 NOTE — Discharge Summary (Signed)
Physician Discharge Summary  Amy [name] ZPH:[id number] DOB: February 18, 1946 DOA: 11/29/2017  PCP: Aretta Nip, MD  Admit date: 11/29/2017 Discharge date: 12/08/2017  Admitted From: Home  Disposition:  Home   Recommendations for Outpatient Follow-up:  1. Follow up with PCP in 1-2 weeks 2. Please obtain BMP/CBC in one week 3. Please follow erythropoietin level.   Home Health; none  Discharge Condition; stable.  CODE STATUS: Full code.  Diet recommendation: Heart Healthy   Brief/Interim Summary: Brief Narrative: Amy A Whren-Nobleis a 72 y.o.femalewith medical history significant forparoxysmal atrial fibrillation on Xarelto, hypertension, hyperlipidemia, hypothyroidism diastolic heart failure, CAD, with catheterization in October 07, 2017, nonobstructive CAD medical management, OSA on CPAP, as well as apical H COM by cardiac MRI, with mild to moderate pulmonary hypertension on chronic O2 4L [place], on CellCept, OPSUMIT Uptravi, followed by cardiology and pulmonology,presenting to the emergency departmentwith bright red blood per rectum.Gastroenterology was consulted.  Assessment & Plan:   Active Problems:   Asthmatic bronchitis , chronic (HCC)   Hypothyroidism   Essential hypertension   Apical variant hypertrophic cardiomyopathy (HCC)   Chronic diastolic heart failure (HCC)   OSA (obstructive sleep apnea)   CAD (coronary artery disease)   PAF (paroxysmal atrial fibrillation) (HCC)   Chronic respiratory failure with hypoxia (HCC)   GIB (gastrointestinal bleeding)   TIA; Expressive aphasia, weakness.  Per daughter patient had  difficulty expressing herself, not talking in complete sentences, around 2;40. Also patient now report that when I saw her this morning she couldn't  understand what I was telling her. She answer questions to me in the morning.  Patient feels weak, tired. She was very weak getting from bedside commode to bed.  Patient is alert, she is able to  answer questions. She feels tired. Per daughter patient is now more improved but not herself.  -Will check CT head stat.  -Neurology Consulted.  -Patient is high risk for TPA due to recent GI bleed and she refuse blood product.  -Discussed with Dr Lorraine Lax. Will start heparin gtt , monitor for evidence of bleeding and transition to oral anticoagulation if she tolerates it.  Feeling better.  She has been on heparin for 30 hours. No recurrence of bleeding. Hb increase to 7.3. Plan to change heparin to eliquis. Risk benefit , discussed with patient. She understand risk for bleeding and will proceed to take eliquis.    Acute lower GI bleed; Jehovah's Witness, Decline blood product  Transfer to step down 8-26 due to hypotension and persistent GI bleed.  GI consulted and following, S/P Flex sig on 8-26 unrevealing.  Nuclear scan did not showed source of bleeding.  Evaluated by cardiology high risk for procedure, but not prohibited.  Received IV iron 8-28 Continue with  B 12 and folic acid.  Hb stable today at 7.6. Per nurse stool more brownish.  Colonoscopy today. Oxygen sat stable on chronic 4 L.  Hb stable.  Had TIA/  Evaluated by neurology who recommended start anticoagulation.  Transition to eliquis. Hb stable , increase to 7.8 erythropoietin level pending.  No sign of bleeding. Plan to discharge today.   Chronic diastolic CHF / Pulmonary hypertension / Chronic hypoxic respiratory failure On chronic 4 L oxygen.  Holding lasix due to soft BP.  Weight stable at 86 Kg  Plan to resume  lasix at discharge   CAD; cath July 2019, non obstructive CAD  H COM by cardiac MRI, with mild to moderate pulmonary hypertension, P-ANCA+, CCP+, RO-52+ Holding Cellcept.  PAF ; start eliquis 9-02.    CKD 3 Stable.  Mild elevation of K, mild hemolysis   HLD;  On statins.   Hypothyroidism;  Continue with synthroid.   Transient numbness finger;  Monitor, rest neuro exam  unremarkable.  Resolved./   Discharge Diagnoses:  Active Problems:   Asthmatic bronchitis , chronic (HCC)   Hypothyroidism   Essential hypertension   Apical variant hypertrophic cardiomyopathy (HCC)   Chronic diastolic heart failure (HCC)   OSA (obstructive sleep apnea)   CAD (coronary artery disease)   PAF (paroxysmal atrial fibrillation) (HCC)   Chronic respiratory failure with hypoxia (HCC)   GIB (gastrointestinal bleeding)    Discharge Instructions  Discharge Instructions    Diet - low sodium heart healthy   Complete by:  As directed    Increase activity slowly   Complete by:  As directed      Allergies as of 12/08/2017      Reactions   Amiodarone    Dyspnea - felt to be 2/2 amio lung toxicity Has tolerated Omnipaque    Zyban [bupropion] Itching      Medication List    STOP taking these medications   cyclobenzaprine 5 MG tablet Commonly known as:  FLEXERIL   metoprolol tartrate 25 MG tablet Commonly known as:  LOPRESSOR   mycophenolate 500 MG tablet Commonly known as:  CELLCEPT   potassium chloride SA 20 MEQ tablet Commonly known as:  K-DUR,KLOR-CON   XARELTO 15 MG Tabs tablet Generic drug:  Rivaroxaban     TAKE these medications   acetaminophen 325 MG tablet Commonly known as:  TYLENOL Take 2 tablets (650 mg total) by mouth every 4 (four) hours as needed for headache or mild pain.   apixaban 5 MG Tabs tablet Commonly known as:  ELIQUIS Take 1 tablet (5 mg total) by mouth 2 (two) times daily.   atorvastatin 80 MG tablet Commonly known as:  LIPITOR Take 1 tablet (80 mg total) by mouth daily with lunch.   colchicine 0.6 MG tablet Take 0.6 mg by mouth daily.   cyanocobalamin 100 MCG tablet Take 1 tablet (100 mcg total) by mouth daily.   dronedarone 400 MG tablet Commonly known as:  MULTAQ Take 1 tablet (400 mg total) by mouth 2 (two) times daily with a meal. What changed:  additional instructions   ezetimibe 10 MG tablet Commonly known  as:  ZETIA Take 1 tablet (10 mg total) by mouth daily. Please make overdue appt with Dr. Radford Pax before anymore refills. 2nd attempt   ferrous sulfate 325 (65 FE) MG tablet Take 1 tablet (325 mg total) by mouth 2 (two) times daily with a meal.   folic acid 1 MG tablet Commonly known as:  FOLVITE Take 1 tablet (1 mg total) by mouth daily.   furosemide 20 MG tablet Commonly known as:  LASIX Take 1 tablet (20 mg total) by mouth daily.   ipratropium-albuterol 0.5-2.5 (3) MG/3ML Soln Commonly known as:  DUONEB Take 3 mLs by nebulization 3 (three) times daily. DX: J44.9   levothyroxine 50 MCG tablet Commonly known as:  SYNTHROID, LEVOTHROID Take 25 mcg by mouth daily before breakfast.   macitentan 10 MG tablet Commonly known as:  OPSUMIT Take 1 tablet (10 mg total) by mouth daily.   multivitamin capsule Take 1 capsule by mouth daily.   pantoprazole 40 MG tablet Commonly known as:  PROTONIX Take 1 tablet (40 mg total) by mouth daily.   predniSONE 10 MG tablet Commonly known  as:  DELTASONE Take 1 tablet (10 mg total) by mouth 2 (two) times daily with a meal.   traMADol 50 MG tablet Commonly known as:  ULTRAM Take 50 mg by mouth every 8 (eight) hours as needed for moderate pain.   UNABLE TO FIND Med Name: CPAP  [facility]   UPTRAVI 800 MCG Tabs Generic drug:  Selexipag Take 1,600 mcg by mouth 2 (two) times daily.   Vitamin D 2000 units Caps Take 2,000 Units by mouth daily.       Allergies  Allergen Reactions  . Amiodarone     Dyspnea - felt to be 2/2 amio lung toxicity Has tolerated Omnipaque   . Zyban [Bupropion] Itching    Consultations: Cardiology Gastroenterologist Neurology   Procedures/Studies: Dg Chest 2 View  Result Date: 11/29/2017 CLINICAL DATA:  Shortness of breath.  Chest discomfort. EXAM: CHEST - 2 VIEW COMPARISON:  Radiograph 10/06/2017.  CT 06/08/2017 FINDINGS: Cardiomegaly is unchanged from prior exam. Unchanged mediastinal contours with  aortic atherosclerosis. No pulmonary edema. Scarring in the left mid lung. No focal airspace disease or pleural effusion. No pneumothorax. Unchanged osseous structures. IMPRESSION: Chronic cardiomegaly and Aortic Atherosclerosis (ICD10-I70.0). No CHF or acute pulmonary process. Electronically Signed   By: Jeb Levering M.D.   On: 11/29/2017 06:22   Ct Head Wo Contrast  Result Date: 12/05/2017 CLINICAL DATA:  Speech abnormality EXAM: CT HEAD WITHOUT CONTRAST TECHNIQUE: Contiguous axial images were obtained from the base of the skull through the vertex without intravenous contrast. COMPARISON:  None. FINDINGS: Brain: Mild to moderate atrophy. Patchy hypodensity throughout the cerebral white matter bilaterally appears chronic. Negative for acute infarct, hemorrhage, or mass Vascular: Negative for hyperdense vessel Skull: Negative Sinuses/Orbits: paranasal sinuses clear. Bilateral cataract surgery. Other: None IMPRESSION: Atrophy and chronic microvascular ischemic changes in the white matter. No acute infarct or hemorrhage. Electronically Signed   By: Franchot Gallo M.D.   On: 12/05/2017 17:00   Mr Brain Wo Contrast  Result Date: 12/05/2017 CLINICAL DATA:  Speech difficulty EXAM: MRI HEAD WITHOUT CONTRAST TECHNIQUE: Multiplanar, multiecho pulse sequences of the brain and surrounding structures were obtained without intravenous contrast. COMPARISON:  CT head 12/05/2017 FINDINGS: Brain: Negative for acute infarct. Mild chronic microvascular ischemic change in the white matter. Ventricle size normal. Cerebral volume normal. Vascular: T1 shortening of the arteries and veins diffusely in the brain. Patient did not receive intravenous gadolinium. Vessels are markedly low signal on gradient echo imaging. This is most consistent with intravenous iron infusion therapy. The patient does have GI bleeding and anemia. Correlate with history of iron fusion. Skull and upper cervical spine: Cervical spondylosis.  Sinuses/Orbits: Mild mucosal edema paranasal sinuses. Mild mucosal edema in the mastoid sinus bilaterally. Bilateral cataract surgery Other: None IMPRESSION: Negative for acute infarct. Mild chronic microvascular ischemia in the white matter. Diffuse vascular abnormality most consistent with intravenous iron infusion therapy. Correlate with history. Electronically Signed   By: Franchot Gallo M.D.   On: 12/05/2017 18:03   Nm Gi Blood Loss  Result Date: 11/30/2017 CLINICAL DATA:  72 year old female with rectal bleeding. EXAM: NUCLEAR MEDICINE GASTROINTESTINAL BLEEDING SCAN TECHNIQUE: Sequential abdominal images were obtained following intravenous administration of Tc-102mlabeled red blood cells. RADIOPHARMACEUTICALS:  25.8 mCi Tc-956mertechnetate in-vitro labeled red cells. COMPARISON:  None. FINDINGS: No evidence of active gastrointestinal bleeding after 2 hours of imaging. Normal renal uptake and excretion into the bladder. IMPRESSION: Negative GI bleeding study. Electronically Signed   By: HeJacqulynn Cadet.D.   On: 11/30/2017  16:56      Subjective: No further bleed.  Feeling well. Denies dyspnea. No further confusion   Discharge Exam: Vitals:   12/08/17 0046 12/08/17 0636  BP:  (!) 100/56  Pulse: (!) 58 61  Resp: (!) 25 16  Temp:  97.6 F (36.4 C)  SpO2: 97% 100%   Vitals:   12/07/17 2226 12/08/17 0036 12/08/17 0046 12/08/17 0636  BP:  (!) 101/54  (!) 100/56  Pulse: 74 67 (!) 58 61  Resp: 18 18 (!) 25 16  Temp:    97.6 F (36.4 C)  TempSrc:    Oral  SpO2: 95%  97% 100%  Weight:    86.2 kg  Height:        General: Pt is alert, awake, not in acute distress Cardiovascular: RRR, S1/S2 +, no rubs, no gallops Respiratory: CTA bilaterally, no wheezing, no rhonchi Abdominal: Soft, NT, ND, bowel sounds + Extremities: no edema, no cyanosis    The results of significant diagnostics from this hospitalization (including imaging, microbiology, ancillary and laboratory) are listed  below for reference.     Microbiology: Recent Results (from the past 240 hour(s))  C difficile quick scan w PCR reflex     Status: None   Collection Time: 11/29/17  8:12 AM  Result Value Ref Range Status   C Diff antigen NEGATIVE NEGATIVE Final   C Diff toxin NEGATIVE NEGATIVE Final   C Diff interpretation No C. difficile detected.  Final    Comment: Performed at [facility], [place] [address] [place], [place] [address]  Gastrointestinal Panel by PCR , Stool     Status: None   Collection Time: 11/29/17  8:12 AM  Result Value Ref Range Status   Campylobacter species NOT DETECTED NOT DETECTED Final   Plesimonas shigelloides NOT DETECTED NOT DETECTED Final   Salmonella species NOT DETECTED NOT DETECTED Final   Yersinia enterocolitica NOT DETECTED NOT DETECTED Final   Vibrio species NOT DETECTED NOT DETECTED Final   Vibrio cholerae NOT DETECTED NOT DETECTED Final   Enteroaggregative E coli (EAEC) NOT DETECTED NOT DETECTED Final   Enteropathogenic E coli (EPEC) NOT DETECTED NOT DETECTED Final   Enterotoxigenic E coli (ETEC) NOT DETECTED NOT DETECTED Final   Shiga like toxin producing E coli (STEC) NOT DETECTED NOT DETECTED Final   Shigella/Enteroinvasive E coli (EIEC) NOT DETECTED NOT DETECTED Final   Cryptosporidium NOT DETECTED NOT DETECTED Final   Cyclospora cayetanensis NOT DETECTED NOT DETECTED Final   Entamoeba histolytica NOT DETECTED NOT DETECTED Final   Giardia lamblia NOT DETECTED NOT DETECTED Final   Adenovirus F40/41 NOT DETECTED NOT DETECTED Final   Astrovirus NOT DETECTED NOT DETECTED Final   Norovirus GI/GII NOT DETECTED NOT DETECTED Final   Rotavirus A NOT DETECTED NOT DETECTED Final   Sapovirus (I, II, IV, and V) NOT DETECTED NOT DETECTED Final    Comment: Performed at [facility], [address] [place], [place] [address]  MRSA PCR Screening     Status: None   Collection Time: 11/30/17 12:23 PM  Result Value Ref Range Status   MRSA by PCR NEGATIVE  NEGATIVE Final    Comment:        The GeneXpert MRSA Assay (FDA approved for NASAL specimens only), is one component of a comprehensive MRSA colonization surveillance program. It is not intended to diagnose MRSA infection nor to guide or monitor treatment for MRSA infections. Performed at [facility], [place] [address], [place], [place]  [address]      Labs: BNP (last 3 results) Recent Labs    12/11/16 1356 06/02/17 1013 10/06/17 0925  BNP 148.9* 126.0* 942.6*   Basic Metabolic Panel: Recent Labs  Lab 12/04/17 0400 12/05/17 0459 12/06/17 0044 12/06/17 0549 12/07/17 0442  NA 137 139 138 138 138  K 4.3 5.3* 3.9 4.2 3.7  CL 113* 114* 111 111 111  CO2 20* 21* 23 21* 21*  GLUCOSE 115* 102* 126* 112* 96  BUN 7* _0 CREATININE 1.09* 1.31* 1.31* 1.31* 1.21*  CALCIUM 8.2* 8.1* 8.2* 8.0* 8.0*   Liver Function Tests: No results for input(s): AST, ALT, ALKPHOS, BILITOT, PROT, ALBUMIN in the last 168 hours. No results for input(s): LIPASE, AMYLASE in the last 168 hours. No results for input(s): AMMONIA in the last 168 hours. CBC: Recent Labs  Lab 12/05/17 0459 12/06/17 0044 12/06/17 0549 12/07/17 0442 12/08/17 0532  WBC 11.8* 12.8* 11.8* 12.7* 13.6*  HGB 7.3* 7.2* 7.0* 7.3* 7.8*  HCT 23.2* 22.9* 22.4* 23.2* 24.9*  MCV 97.5 97.4 97.8 98.3 99.2  PLT 194 182 175 183 171   Cardiac Enzymes: No results for input(s): CKTOTAL, CKMB, CKMBINDEX, TROPONINI in the last 168 hours. BNP: Invalid input(s): POCBNP CBG: Recent Labs  Lab 12/05/17 1528  GLUCAP 89   D-Dimer No results for input(s): DDIMER in the last 72 hours. Hgb A1c No results for input(s): HGBA1C in the last 72 hours. Lipid Profile No results for input(s): CHOL, HDL, LDLCALC, TRIG, CHOLHDL, LDLDIRECT in the last 72 hours. Thyroid function studies No results for input(s): TSH, T4TOTAL, T3FREE, THYROIDAB in the last 72 hours.  Invalid input(s): FREET3 Anemia work up No results for input(s):  VITAMINB12, FOLATE, FERRITIN, TIBC, IRON, RETICCTPCT in the last 72 hours. Urinalysis    Component Value Date/Time   COLORURINE STRAW (A) 10/01/2016 1537   APPEARANCEUR CLEAR 10/01/2016 1537   LABSPEC 1.005 10/01/2016 1537   PHURINE 5.0 10/01/2016 1537   GLUCOSEU NEGATIVE 10/01/2016 1537   HGBUR NEGATIVE 10/01/2016 1537   BILIRUBINUR NEGATIVE 10/01/2016 1537   KETONESUR NEGATIVE 10/01/2016 1537   PROTEINUR NEGATIVE 10/01/2016 1537   NITRITE NEGATIVE 10/01/2016 1537   LEUKOCYTESUR NEGATIVE 10/01/2016 1537   Sepsis Labs Invalid input(s): PROCALCITONIN,  WBC,  LACTICIDVEN Microbiology Recent Results (from the past 240 hour(s))  C difficile quick scan w PCR reflex     Status: None   Collection Time: 11/29/17  8:12 AM  Result Value Ref Range Status   C Diff antigen NEGATIVE NEGATIVE Final   C Diff toxin NEGATIVE NEGATIVE Final   C Diff interpretation No C. difficile detected.  Final    Comment: Performed at [facility], [place] [address] [place], [place] [address]  Gastrointestinal Panel by PCR , Stool     Status: None   Collection Time: 11/29/17  8:12 AM  Result Value Ref Range Status   Campylobacter species NOT DETECTED NOT DETECTED Final   Plesimonas shigelloides NOT DETECTED NOT DETECTED Final   Salmonella species NOT DETECTED NOT DETECTED Final   Yersinia enterocolitica NOT DETECTED NOT DETECTED Final   Vibrio species NOT DETECTED NOT DETECTED Final   Vibrio cholerae NOT DETECTED NOT DETECTED Final   Enteroaggregative E coli (EAEC) NOT DETECTED NOT DETECTED Final   Enteropathogenic E coli (EPEC) NOT DETECTED NOT DETECTED Final   Enterotoxigenic E coli (ETEC) NOT DETECTED NOT DETECTED Final   Shiga like toxin producing E coli (STEC) NOT DETECTED NOT DETECTED Final  Shigella/Enteroinvasive E coli (EIEC) NOT DETECTED NOT DETECTED Final   Cryptosporidium NOT DETECTED NOT DETECTED Final   Cyclospora cayetanensis NOT DETECTED NOT DETECTED Final   Entamoeba histolytica NOT  DETECTED NOT DETECTED Final   Giardia lamblia NOT DETECTED NOT DETECTED Final   Adenovirus F40/41 NOT DETECTED NOT DETECTED Final   Astrovirus NOT DETECTED NOT DETECTED Final   Norovirus GI/GII NOT DETECTED NOT DETECTED Final   Rotavirus A NOT DETECTED NOT DETECTED Final   Sapovirus (I, II, IV, and V) NOT DETECTED NOT DETECTED Final    Comment: Performed at [facility], [address] [place], [place] [address]  MRSA PCR Screening     Status: None   Collection Time: 11/30/17 12:23 PM  Result Value Ref Range Status   MRSA by PCR NEGATIVE NEGATIVE Final    Comment:        The GeneXpert MRSA Assay (FDA approved for NASAL specimens only), is one component of a comprehensive MRSA colonization surveillance program. It is not intended to diagnose MRSA infection nor to guide or monitor treatment for MRSA infections. Performed at [facility], [place] [address], [place], [place] [address]      Time coordinating discharge: Over 30 minutes  SIGNED:   Elmarie Shiley, MD  [facility] 12/08/2017, 8:24 AM Pager   If 7PM-7AM, please contact night-coverage [email or web address] Password [email or web address]

## 2017-12-08 NOTE — Discharge Instructions (Signed)
Gastrointestinal Bleeding Gastrointestinal bleeding is bleeding somewhere along the path food travels through the body (digestive tract). This path is anywhere between the mouth and the opening of the butt (anus). You may have blood in your poop (stools) or have black poop. If you throw up (vomit), there may be blood in it. This condition can be mild, serious, or even life-threatening. If you have a lot of bleeding, you may need to stay in the hospital. Follow these instructions at home:  Take over-the-counter and prescription medicines only as told by your doctor.  Eat foods that have a lot of fiber in them. These foods include whole grains, fruits, and vegetables. You can also try eating 1-3 prunes each day.  Drink enough fluid to keep your pee (urine) clear or pale yellow.  Keep all follow-up visits as told by your doctor. This is important. Contact a doctor if:  Your symptoms do not get better. Get help right away if:  Your bleeding gets worse.  You feel dizzy or you pass out (faint).  You feel weak.  You have very bad cramps in your back or belly (abdomen).  You pass large clumps of blood (clots) in your poop.  Your symptoms are getting worse. This information is not intended to replace advice given to you by your health care provider. Make sure you discuss any questions you have with your health care provider.   Information on my medicine - ELIQUIS (apixaban) Why was Eliquis prescribed for you? Eliquis was prescribed for you to reduce the risk of a blood clot forming that can cause a stroke if you have a medical condition called atrial fibrillation (a type of irregular heartbeat).  What do You need to know about Eliquis ? Take your Eliquis TWICE DAILY - one tablet in the morning and one tablet in the evening with or without food. If you have difficulty swallowing the tablet whole please discuss with your pharmacist how to take the medication safely.  Take Eliquis  exactly as prescribed by your doctor and DO NOT stop taking Eliquis without talking to the doctor who prescribed the medication.  Stopping may increase your risk of developing a stroke.  Refill your prescription before you run out.  After discharge, you should have regular check-up appointments with your healthcare provider that is prescribing your Eliquis.  In the future your dose may need to be changed if your kidney function or weight changes by a significant amount or as you get older.  What do you do if you miss a dose? If you miss a dose, take it as soon as you remember on the same day and resume taking twice daily.  Do not take more than one dose of ELIQUIS at the same time to make up a missed dose.  Important Safety Information A possible side effect of Eliquis is bleeding. You should call your healthcare provider right away if you experience any of the following: ? Bleeding from an injury or your nose that does not stop. ? Unusual colored urine (red or dark brown) or unusual colored stools (red or black). ? Unusual bruising for unknown reasons. ? A serious fall or if you hit your head (even if there is no bleeding).  Some medicines may interact with Eliquis and might increase your risk of bleeding or clotting while on Eliquis. To help avoid this, consult your healthcare provider or pharmacist prior to using any new prescription or non-prescription medications, including herbals, vitamins, non-steroidal anti-inflammatory drugs (NSAIDs) and  supplements.  This website has more information on Eliquis (apixaban): http://www.eliquis.com/eliquis/home

## 2017-12-08 NOTE — Progress Notes (Addendum)
Advanced Heart Failure Rounding Note  PCP-Cardiologist: No primary care provider on file.   Subjective:    Colonoscopy 8/29: No active bleeding. There was small nonbleeding erosion in the ascending colon as well as evidence of recent bleeding from sigmoid colon diverticulum at 35-40 cm. It also showed large internal hemorrhoids.  AC held for bleed  Started on eliquis  9/2.  No bleeding so far, Hgb stable.    Feeling well this am, not SOB on 4LNC, can lay flat without issue.  No CP   Objective:   Weight Range: 86.2 kg Body mass index is 33.66 kg/m.   Vital Signs:   Temp:  [97.6 F (36.4 C)-98.4 F (36.9 C)] 97.6 F (36.4 C) (09/03 0636) Pulse Rate:  [58-74] 61 (09/03 0636) Resp:  [16-25] 16 (09/03 0636) BP: (97-105)/(53-59) 100/56 (09/03 0636) SpO2:  [92 %-100 %] 100 % (09/03 0636) Weight:  [86.2 kg] 86.2 kg (09/03 0636) Last BM Date: 12/07/17(per pt)  Weight change: Filed Weights   12/06/17 0448 12/07/17 0453 12/08/17 0636  Weight: 86.5 kg 86.4 kg 86.2 kg    Intake/Output:   Intake/Output Summary (Last 24 hours) at 12/08/2017 0825 Last data filed at 12/08/2017 0500 Gross per 24 hour  Intake 307 ml  Output 1850 ml  Net -1543 ml      Physical Exam    General: Well appearing. No resp difficulty. HEENT: Normal Neck: Supple. JVP flat. Carotids 2+ bilat; no bruits. No thyromegaly or nodule noted. Cor: PMI nondisplaced. RRR, No M/G/R noted Lungs: CTAB, normal effort. Abdomen: Soft, non-tender, non-distended, no HSM. No bruits or masses. +BS  Extremities: No cyanosis, clubbing, or rash. No LE edema. BLE TED hose on Neuro: Alert & orientedx3, moves all 4 extremities w/o difficulty. Affect pleasant   Telemetry   NSR 60-70s.  Personally reviewed.   EKG   No new tracings.   Labs    CBC Recent Labs    12/07/17 0442 12/08/17 0532  WBC 12.7* 13.6*  HGB 7.3* 7.8*  HCT 23.2* 24.9*  MCV 98.3 99.2  PLT 183 726   Basic Metabolic Panel Recent Labs   12/06/17 0549 12/07/17 0442  NA 138 138  K 4.2 3.7  CL 111 111  CO2 21* 21*  GLUCOSE 112* 96  BUN 12 12  CREATININE 1.31* 1.21*  CALCIUM 8.0* 8.0*   Liver Function Tests No results for input(s): AST, ALT, ALKPHOS, BILITOT, PROT, ALBUMIN in the last 72 hours. No results for input(s): LIPASE, AMYLASE in the last 72 hours. Cardiac Enzymes No results for input(s): CKTOTAL, CKMB, CKMBINDEX, TROPONINI in the last 72 hours.  BNP: BNP (last 3 results) Recent Labs    12/11/16 1356 06/02/17 1013 10/06/17 0925  BNP 148.9* 126.0* 675.7*    ProBNP (last 3 results) No results for input(s): PROBNP in the last 8760 hours.   D-Dimer No results for input(s): DDIMER in the last 72 hours. Hemoglobin A1C No results for input(s): HGBA1C in the last 72 hours. Fasting Lipid Panel No results for input(s): CHOL, HDL, LDLCALC, TRIG, CHOLHDL, LDLDIRECT in the last 72 hours. Thyroid Function Tests No results for input(s): TSH, T4TOTAL, T3FREE, THYROIDAB in the last 72 hours.  Invalid input(s): FREET3  Other results:   Imaging    No results found.   Medications:     Scheduled Medications: . apixaban  5 mg Oral BID  . atorvastatin  80 mg Oral Q lunch  . colchicine  0.6 mg Oral Daily  . dronedarone  400 mg Oral BID WC  . ezetimibe  10 mg Oral Daily  . famotidine  20 mg Oral Daily  . ferrous sulfate  325 mg Oral BID WC  . folic acid  1 mg Oral Daily  . ipratropium-albuterol  3 mL Nebulization TID  . levothyroxine  25 mcg Oral QAC breakfast  . macitentan  10 mg Oral Daily  . predniSONE  10 mg Oral BID WC  . Selexipag  1,600 mcg Oral BID  . 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  Admitted with GI bleed.   Assessment/Plan   1.  GI bleed  [place] witness. No blood products.  Xarelto stopped on admit.  - GI consulted. Flex Sigmoidoscopy with old blood and diverticulosis.  - Tagged RBC negative.  - Colonoscopy with no active bleeding. There was small nonbleeding erosion in the ascending colon as well as evidence of recent bleeding from sigmoid colon diverticulum at 35-40 cm. It also showed large internal hemorrhoids. -Back on eliquis 9/2.  No bleeding so far, Hgb up to 7.8.    2. 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 Followed by Dr Lake Bells and Dr Aundra Dubin. Refuses cellcept given 3% risk of gastric hemorrhage. VQ scan was negative 2018.  - Intolerant adcirca - On opsumit and selexipag.  - Wears 4 L O2 at home. Continue for now.   3. PAF - Remains in NSR. - on multaq 400 mg daily.  - at home she takes metoprolol as needed.  - Had A fib ablation 2017  - No amio with history of suspected amio toxicity  - Back on eliquis 9/2  4. Chronic Diastolic HF Most recent ECHO 09/2017 EF 65-70%. Grade III DD Peak PA pressure 42 mm hg  - Volume status stable today.  - On 20 mg daily at home.   5. Chronic Hypoxic Respiratory Failure - Continue 4 liters (home dose) oxygen. O2 sats stable.    Length of Stay: [place], MD  12/08/2017, 8:25 AM  Advanced Heart Failure Team Pager [phone] (M-F; [place])  Please contact [facility] for night-coverage after hours (4p -7a ) and weekends on [email or web address]  Erlene Quan Winfrey 12/08/2017 8:25 AM   Patient seen with resident, agree with the above note.  She is going home today.  Had TIA and restarted on anticoagulation with Eliquis 5 mg bid. Other cardiac meds should be same  as prior to admission.    No further overt bleeding.    She will need followup in CHF clinic.   [name] 12/08/2017

## 2017-12-09 DIAGNOSIS — Z789 Other specified health status: Secondary | ICD-10-CM | POA: Diagnosis not present

## 2017-12-09 DIAGNOSIS — Z7901 Long term (current) use of anticoagulants: Secondary | ICD-10-CM | POA: Diagnosis not present

## 2017-12-09 DIAGNOSIS — G459 Transient cerebral ischemic attack, unspecified: Secondary | ICD-10-CM | POA: Diagnosis not present

## 2017-12-09 DIAGNOSIS — I422 Other hypertrophic cardiomyopathy: Secondary | ICD-10-CM | POA: Diagnosis not present

## 2017-12-09 DIAGNOSIS — I480 Paroxysmal atrial fibrillation: Secondary | ICD-10-CM | POA: Diagnosis not present

## 2017-12-09 DIAGNOSIS — K922 Gastrointestinal hemorrhage, unspecified: Secondary | ICD-10-CM | POA: Diagnosis not present

## 2017-12-10 ENCOUNTER — Telehealth (HOSPITAL_COMMUNITY): Payer: Self-pay | Admitting: Cardiology

## 2017-12-10 NOTE — Telephone Encounter (Signed)
Left message to call back to confirm HFU appt w/APP side for 12/15/17 _0 :30 pm.  Per Kevan Rosebush, RN, pt also needs to come for the 01/01/18 appt with Dr. Aundra Dubin.

## 2017-12-14 DIAGNOSIS — F0631 Mood disorder due to known physiological condition with depressive features: Secondary | ICD-10-CM | POA: Diagnosis not present

## 2017-12-14 NOTE — Telephone Encounter (Signed)
Called and spoke with family member to confirm appt.  She will remind patient of appt.

## 2017-12-15 ENCOUNTER — Ambulatory Visit (HOSPITAL_COMMUNITY)
Admission: RE | Admit: 2017-12-15 | Discharge: 2017-12-15 | Disposition: A | Discharge status: Home / Self Care | Payer: Medicare Other | Source: Ambulatory Visit | Attending: Cardiology | Admitting: Cardiology

## 2017-12-15 ENCOUNTER — Encounter (HOSPITAL_COMMUNITY): Payer: Self-pay

## 2017-12-15 VITALS — BP 118/62 | HR 74 | Wt 190.4 lb

## 2017-12-15 DIAGNOSIS — K219 Gastro-esophageal reflux disease without esophagitis: Secondary | ICD-10-CM | POA: Diagnosis not present

## 2017-12-15 DIAGNOSIS — I480 Paroxysmal atrial fibrillation: Secondary | ICD-10-CM | POA: Insufficient documentation

## 2017-12-15 DIAGNOSIS — J449 Chronic obstructive pulmonary disease, unspecified: Secondary | ICD-10-CM | POA: Diagnosis not present

## 2017-12-15 DIAGNOSIS — Z8673 Personal history of transient ischemic attack (TIA), and cerebral infarction without residual deficits: Secondary | ICD-10-CM | POA: Diagnosis not present

## 2017-12-15 DIAGNOSIS — Z803 Family history of malignant neoplasm of breast: Secondary | ICD-10-CM | POA: Insufficient documentation

## 2017-12-15 DIAGNOSIS — Z09 Encounter for follow-up examination after completed treatment for conditions other than malignant neoplasm: Secondary | ICD-10-CM | POA: Diagnosis not present

## 2017-12-15 DIAGNOSIS — Z8719 Personal history of other diseases of the digestive system: Secondary | ICD-10-CM

## 2017-12-15 DIAGNOSIS — Z79890 Hormone replacement therapy (postmenopausal): Secondary | ICD-10-CM | POA: Insufficient documentation

## 2017-12-15 DIAGNOSIS — I422 Other hypertrophic cardiomyopathy: Secondary | ICD-10-CM | POA: Diagnosis not present

## 2017-12-15 DIAGNOSIS — N183 Chronic kidney disease, stage 3 (moderate): Secondary | ICD-10-CM | POA: Diagnosis not present

## 2017-12-15 DIAGNOSIS — I2721 Secondary pulmonary arterial hypertension: Secondary | ICD-10-CM | POA: Diagnosis not present

## 2017-12-15 DIAGNOSIS — Z79891 Long term (current) use of opiate analgesic: Secondary | ICD-10-CM | POA: Diagnosis not present

## 2017-12-15 DIAGNOSIS — Z79899 Other long term (current) drug therapy: Secondary | ICD-10-CM | POA: Insufficient documentation

## 2017-12-15 DIAGNOSIS — G4733 Obstructive sleep apnea (adult) (pediatric): Secondary | ICD-10-CM | POA: Insufficient documentation

## 2017-12-15 DIAGNOSIS — E039 Hypothyroidism, unspecified: Secondary | ICD-10-CM | POA: Insufficient documentation

## 2017-12-15 DIAGNOSIS — Z87891 Personal history of nicotine dependence: Secondary | ICD-10-CM | POA: Diagnosis not present

## 2017-12-15 DIAGNOSIS — I2510 Atherosclerotic heart disease of native coronary artery without angina pectoris: Secondary | ICD-10-CM | POA: Diagnosis not present

## 2017-12-15 DIAGNOSIS — E785 Hyperlipidemia, unspecified: Secondary | ICD-10-CM | POA: Diagnosis not present

## 2017-12-15 DIAGNOSIS — Z8249 Family history of ischemic heart disease and other diseases of the circulatory system: Secondary | ICD-10-CM | POA: Diagnosis not present

## 2017-12-15 DIAGNOSIS — Z801 Family history of malignant neoplasm of trachea, bronchus and lung: Secondary | ICD-10-CM | POA: Insufficient documentation

## 2017-12-15 DIAGNOSIS — I5032 Chronic diastolic (congestive) heart failure: Secondary | ICD-10-CM

## 2017-12-15 DIAGNOSIS — I270 Primary pulmonary hypertension: Secondary | ICD-10-CM | POA: Diagnosis not present

## 2017-12-15 LAB — CBC
HCT: 30.3 % — ABNORMAL LOW (ref 36.0–46.0)
Hemoglobin: 9.3 g/dL — ABNORMAL LOW (ref 12.0–15.0)
MCH: 31.4 pg (ref 26.0–34.0)
MCHC: 30.7 g/dL (ref 30.0–36.0)
MCV: 102.4 fL — AB (ref 78.0–100.0)
PLATELETS: 236 10*3/uL (ref 150–400)
RBC: 2.96 MIL/uL — AB (ref 3.87–5.11)
RDW: 23.4 % — AB (ref 11.5–15.5)
WBC: 9.9 10*3/uL (ref 4.0–10.5)

## 2017-12-15 LAB — BASIC METABOLIC PANEL
ANION GAP: 10 (ref 5–15)
BUN: 26 mg/dL — ABNORMAL HIGH (ref 8–23)
CO2: 22 mmol/L (ref 22–32)
Calcium: 8.6 mg/dL — ABNORMAL LOW (ref 8.9–10.3)
Chloride: 105 mmol/L (ref 98–111)
Creatinine, Ser: 1.39 mg/dL — ABNORMAL HIGH (ref 0.44–1.00)
GFR calc Af Amer: 43 mL/min — ABNORMAL LOW (ref >60)
GFR calc non Af Amer: 37 mL/min — ABNORMAL LOW (ref >60)
GLUCOSE: 77 mg/dL (ref 70–99)
POTASSIUM: 3.8 mmol/L (ref 3.5–5.1)
Sodium: 137 mmol/L (ref 135–145)

## 2017-12-15 LAB — BRAIN NATRIURETIC PEPTIDE: B Natriuretic Peptide: 219.6 pg/mL — ABNORMAL HIGH (ref 0.0–100.0)

## 2017-12-15 NOTE — Progress Notes (Signed)
PCP: Dr. Radene Ou Cardiology: Dr. Radford Pax HF Cardiology: Dr. Aundra Dubin Pulmonary: Dr. Lake Bells GI : Dr Alessandra Bevels  72 y.o. Amy [name] witness with history of COPD, OSA on CPAP, apical hypertrophic cardiomyopathy, chronic diastolic CHF, and paroxysmal atrial fibrillation returns for followup of CHF and pulmonary hypertension.  Patient was admitted in 3/18 with CHF and dyspnea, found to have pulmonary arterial hypertension by RHC.  Prior to this, she has had a long history of PAF.  She is currently taking dronedarone but has had breakthrough fibrillation.  She is in NSR today.  She had an atrial fibrillation ablation in the past. She has apical hypertrophic cardiomyopathy proven by cardiac MRI with a consistent ECG.   She is on Opsumit and has started Selexipag, now titrated up to 1400 mcg am/1600 mcg pm. She was unable to tolerate Adcirca.  She is doing pulmonary rehab. Symptomatically, she has been worse despite selective pulmonary vasodilators to treat PAH.  Oxygen requirement has increased to 3L at rest and 8L with exertion.  Dyspnea has worsened.   Because of these findings, she had RHC in 3/19.  This showed lower PA pressure and normal right and left heart filling pressures, but more worrisomely, cardiac output was low.  However, on repeat of echo in 3/19, the RV actually looked normal in size and systolic function.  Given concern for possible PVOD as cause of her worsening symptoms on treatment, high resolution CT chest was ordered.  This was degraded by respiratory artifact but showed no definite significant abnormalities.  She was seen by Dr Lake Bells. He sent autoimmune serologies again, weak P-ANCA positivity so she was referred to rheumatology.  Echo bubble study was negative.  CCP and Ro-52 were positive.  Also of note, she has had atrial fibrillation ablation and pulmonary vein stenosis is a consideration.  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  She was started on Cellcept but this was stopped due to joint pain which resolved after stopping it.  She was also on prednisone but stopped this also when she went into atrial fibrillation in 7/19 (DCCV back to NSR in ER).   Admitted 8/25 through 12/08/2017. Admitted with GI bleed. There was concern GI bleed was from Cellcept so this was stopped. She underwent colonoscopy on 8/29. There was small nonbleeding erosion in the ascending colon as well as evidence of recent bleeding from sigmoid colon diverticulum at 35-40 cm. It also showed large internal hemorrhoids. Anticoagulants stopped. Cellcept was also stopped due to potential bleeding effect. Hospital course complicated by TIA. Neurology consulted and she restarted Eliquis was started.   Today she returns for HF follow up. Overall feeling weak. Continues to use 4-6 liters oxygen. Denies syncope. Remains SOB with exertion.  Denies PND/Orthopnea. No BRBPR.  Appetite ok. No fever or chills. Weight at home 185 pounds. Walking with a walker. Taking all medications.   Labs (2/18): TSH normal Labs (3/18): K 3.8, creatinine 1.26, BNP 424 Labs (5/18): K 4.4, creatinine 1.96, anti-SCL 70 negative, RF very slightly elevated, HIV negative.  Labs (6/18): K 4, creatinine 1.2, LDL 49 Labs (9/18): BNP 149, K 4, creatinine 1.4, BNP 149 Labs (2/19): BNP 126, K 3.8, creatinine 1.5, CCP+, Ro-52+, elevated CK.  Labs (7/19): BNP 676, K 4.7, creatinine 1.74 Labs (12/08/2017): Hgb 7.8   4/18 6 minute walk: 109 m  10/18 6 minute walk: 229 m 2/19 6 minute walk: < 100 m 5/19 6 minute walk: 107  m  PMH:  1. COPD: Quit smoking 2011.  - PFTs (3/18): minimal obstruction, minimal restriction, DLCO 22% (severely decreased).  2. Hypothyroidism 3. Hyperlipidemia 4. OSA: Uses CPAP 5. Apical hypertrophic cardiomyopathy:  - Cardiac MRI (7/16) with EF 79%, mid-apical LV hypertrophy with spade-like ventricle, mid-myocardial LGE was noted at the apex.   6. GERD 7.  Atrial fibrillation: Paroxysmal.  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 Long QT interval so dofetilide and sotalol have not been used. She has had atrial fibrillation ablation.  - Currently on dronedarone.  - DCCV to NSR in ER 7/19.  8. Coronary artery disease: LHC (7/16) with nonobstructive disease + 85% ostial stenosis small D1.  9. Diastolic CHF: Echo (2/02) with EF 65-70%, apical hypertrophic cardiomyopathy, severe LAE, PASP 86 mmHg.  - RHC (3/18): mean RA 4, PA 69/23 mean 38, mean PCWP 8, CI 2.17, PVR 7.3 WU Fick, 9.6 WU thermo.  - Echo (3/19): EF 65-70%, apical hypertrophic cardiomyopathy, normal RV size and systolic function, severe LAE.  - Echo (6/19): EF 65-70%, apical HCM, restrictive diastolic dysfunction, negative bubble study, PASP 42 mmHg.  10. Pulmonary hypertension: See RHC above.  - V/Q scan negative 3/18 - HIV, SCL70 negative.  RF borderline elevated, doubt significant.  ANA negative.  - Unable to tolerate Adcirca - RHC (3/19): mean RA 6, PA 49/16 mean 29, mean PCWP 10, CI 1.9/PVR 5.3 WU Fick, CI 1.4/PVR 7.3 Thermo.  - High resolution CT chest (3/19): respiratory motion artifact present, but suspect mild emphysema with lungs grossly clear.  - P-ANCA+, CCP+, RO-52+, CK elevated.  - Echo 6/19 with negative bubble study.  11. CPX (12/17): peak VO2 8.5, RER 0.96, VE/VCO2 slope 67. Submaximal but appears to show severe functional impairment.   12. CKD: Stage 3.   Social History   Socioeconomic History  . Marital status: Widowed    Spouse name: divorced.  . Number of children: 1  . Years of education: Not on file  . Highest education level: Not on file  Occupational History  . Occupation: retired. still works for [employer].   Social Needs  . Financial resource strain: Not on file  . Food insecurity:    Worry: Not on file    Inability: Not on file  . Transportation needs:    Medical: Not on file    Non-medical: Not on file  Tobacco Use  . Smoking status: Former Smoker     Packs/day: 0.50    Years: 44.00    Pack years: 22.00    Types: Cigarettes    Last attempt to quit: 04/07/2009    Years since quitting: 8.6  . Smokeless tobacco: Never Used  Substance and Sexual Activity  . Alcohol use: No  . Drug use: No  . Sexual activity: Never  Lifestyle  . Physical activity:    Days per week: Not on file    Minutes per session: Not on file  . Stress: Not on file  Relationships  . Social connections:    Talks on phone: Not on file    Gets together: Not on file    Attends religious service: Not on file    Active member of club or organization: Not on file    Attends meetings of clubs or organizations: Not on file    Relationship status: Not on file  . Intimate partner violence:    Fear of current or ex partner: Not on file    Emotionally abused: Not on file    Physically abused: Not  on file    Forced sexual activity: Not on file  Other Topics Concern  . Not on file  Social History Narrative   Pt lives alone with 2 dogs.    Family History  Problem Relation Age of Onset  . Heart disease Sister        Good Pastures Syndrome  . Gout Father   . Lung cancer Father        smoked  . Breast cancer Paternal Aunt    Review of systems complete and found to be negative unless listed in HPI.    Current Outpatient Medications  Medication Sig Dispense Refill  . acetaminophen (TYLENOL) 325 MG tablet Take 2 tablets (650 mg total) by mouth every 4 (four) hours as needed for headache or mild pain.    Marland Kitchen apixaban (ELIQUIS) 5 MG TABS tablet Take 1 tablet (5 mg total) by mouth 2 (two) times daily. 60 tablet 1  . atorvastatin (LIPITOR) 80 MG tablet Take 1 tablet (80 mg total) by mouth daily with lunch. 90 tablet 3  . Cholecalciferol (VITAMIN D) 2000 UNITS CAPS Take 2,000 Units by mouth daily.     . colchicine 0.6 MG tablet Take 0.6 mg by mouth daily.     Marland Kitchen dronedarone (MULTAQ) 400 MG tablet Take 1 tablet (400 mg total) by mouth 2 (two) times daily with a meal. 60 tablet 0   . ezetimibe (ZETIA) 10 MG tablet Take 1 tablet (10 mg total) by mouth daily. Please make overdue appt with Dr. Radford Pax before anymore refills. 2nd attempt 30 tablet 0  . ferrous sulfate 325 (65 FE) MG tablet Take 1 tablet (325 mg total) by mouth 2 (two) times daily with a meal. 60 tablet 3  . folic acid (FOLVITE) 1 MG tablet Take 1 tablet (1 mg total) by mouth daily. 30 tablet 0  . furosemide (LASIX) 20 MG tablet Take 1 tablet (20 mg total) by mouth daily. 30 tablet 6  . ipratropium-albuterol (DUONEB) 0.5-2.5 (3) MG/3ML SOLN Take 3 mLs by nebulization 3 (three) times daily. DX: J44.9 360 mL 5  . levothyroxine (SYNTHROID, LEVOTHROID) 50 MCG tablet Take 25 mcg by mouth daily before breakfast.     . Macitentan (OPSUMIT) 10 MG TABS Take 1 tablet (10 mg total) by mouth daily. 30 tablet 11  . Multiple Vitamin (MULTIVITAMIN) capsule Take 1 capsule by mouth daily.    . pantoprazole (PROTONIX) 40 MG tablet Take 1 tablet (40 mg total) by mouth daily. 90 tablet 2  . predniSONE (DELTASONE) 10 MG tablet Take 1 tablet (10 mg total) by mouth 2 (two) times daily with a meal. 30 tablet 0  . Selexipag (UPTRAVI) 800 MCG TABS Take 1,600 mcg by mouth 2 (two) times daily.     . traMADol (ULTRAM) 50 MG tablet Take 50 mg by mouth every 8 (eight) hours as needed for moderate pain.    Marland Kitchen UNABLE TO FIND Med Name: CPAP  D[facility]    . vitamin B-12 100 MCG tablet Take 1 tablet (100 mcg total) by mouth daily. 30 tablet 0   No current facility-administered medications for this encounter.    BP 118/62   Pulse 74   Wt 86.4 kg (190 lb 6.4 oz)   SpO2 (!) 88% Comment: on 5L pulsed  BMI 33.73 kg/m   Wt Readings from Last 3 Encounters:  12/15/17 86.4 kg (190 lb 6.4 oz)  12/08/17 86.2 kg (190 lb)  10/28/17 85.7 kg (189 lb)   General:  Appears  chronically ill. No resp difficulty. Arrived in wheel chair.  HEENT: normal Neck: supple. no JVD. Carotids 2+ bilat; no bruits. No lymphadenopathy or thryomegaly appreciated. Cor: PMI  nondisplaced. Regular rate & rhythm. No rubs, gallops or murmurs. Lungs: clear on 4 liters oxygen.  Abdomen: soft, nontender, nondistended. No hepatosplenomegaly. No bruits or masses. Good bowel sounds. Extremities: no cyanosis, clubbing, rash, R and L foot trace edema.  Neuro: alert & orientedx3, cranial nerves grossly intact. moves all 4 extremities w/o difficulty. Affect pleasant  Assessment/Plan: 1. Chronic diastolic CHF: Echo 2/24 with EF 65-70%, normal RV size and systolic function.  2/19 echo, the RV looked fairly normal.  NYHA III. Volume status stable. Continue lasix 20 mg daily and potassium 10 meq daily.  - Check BMET BNP today.  2. Apical hypertrophic cardiomyopathy: Cardiac MRI in 7/16 showed mid-apical LV hypertrophy with vigorous contraction. Now off metoprolol with slow HR (uses metoprolol prn tachycardia - atrial fibrillation).  ECG is consistent with apical HCM.   3. Atrial fibrillation:   Regular pulse today.  Per  Dr Rayann Heman, would not re-do her ablation. Breakthrough atrial fibrillation in 7/19, DCCV back to NSR in ER.  - Off xarelto.  - Tolerating eliquis. Check CBC today. No bleeding.   - Continue dronedarone.  4. Pulmonary hypertension: RHC in 3/18 showed moderate pulmonary arterial hypertension with elevated PVR (7.3 WU by Fick, 9.6 WU by thermo). She has COPD but I do not think this plays a large role in the pulmonary hypertension =>  PFTs in 3/18 showed minimal obstruction and restriction, there was significantly decreased DLCO, this may be due to pulmonary vascular disease.  OSA may play a role but not a large one. V/Q scan did not suggest chronic PEs in 3/18.  RF borderline elevated (probably not significant), HIV negative, Anti-SCL70 negative, ANA negative, P-ANCA weakly positive. Suspected group 1 PAH, but she has not significantly improved with pulmonary vasodilators.  There was concern that she could have pulmonary veno-occlusive disease or pulmonary capillary  hemangiomatosis based on the response to [facility] treatment thus far.  Her RHC in 3/19 showed lower PA pressure and PVR with normal right and left heart filling pressures, but cardiac output was lower than prior.  Surprisingly, her RV looked normal on echo so I question how accurate the cardiac output measurement really was.  Bubble study negative on echo. High resolution CT completed to look for evidence of ILD, but it seems to have been degraded by respiratory artifact.  No severe abnormality was detected on the study.  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 She was started on Cellcept and prednisone but stopped both due to intolerances.  - Also of note, she has a history of afib ablation so pulmonary vein stenosis certainly remains a concern. Dr Aundra Dubin will evaluate next visit. - Continue Opsumit.     - She did not tolerate Adcirca.   - With no definite evidence for PVOD, we have continued to titrate up her selexipag, now on 1600 twice a day.   - off cellcept due to concern for bleeding.  5. GI bleed No BRBPR. Continue eliquis for now. Check CBC today.  6. TIA Most recent hospitalization when she was off anticoagulation.   Referred to [facility] for [place] and HHPT.   Follow up in 3 weeks with Dr Aundra Dubin.   Darrick Grinder, NP  12/15/2017

## 2017-12-15 NOTE — Patient Instructions (Signed)
Routine lab work today. Will notify you of abnormal results, otherwise no news is good news!  No changes to medication at this time.  Will refer to [facility] for home health nurse and physical therapy. Their office will call you to schedule initial home assessment.  Follow up as scheduled with Dr. Aundra Dubin. See next sheet for appointment details.  Take all medication as prescribed the day of your appointment. Bring all medications with you to your appointment.  Do the following things EVERYDAY: 1) Weigh yourself in the morning before breakfast. Write it down and keep it in a log. 2) Take your medicines as prescribed 3) Eat low salt foods-Limit salt (sodium) to 2000 mg per day.  4) Stay as active as you can everyday 5) Limit all fluids for the day to less than 2 liters

## 2017-12-17 ENCOUNTER — Other Ambulatory Visit: Payer: Self-pay | Admitting: Pharmacist

## 2017-12-17 NOTE — Patient Outreach (Signed)
[facility]) Care Management  [place]   12/17/2017  Amy [name] 1945/12/04 [id number]  Subjective: Patient is a 72 year old female with multiple medical conditions including but not limited to: Cardiomyopathy, dyslipidemia, OSA on CPAP,  hypertension, pulmonary hypertension, diastolic heart failure and A Fib status post ablation. Patient was hospitalized 11/29/17-12/08/17 due to GI bleed.  Home visit was completed today at the patient's home.  HIPAA identifiers were obtained.  Patient's daughter, Lanelle Bal was also present for the visit.  Patient requested a referral for [place] as she felt like she needed more support after being discharged.    [facility] is scheduled to complete an assessment for nursing and physical therapy but that has not occurred yet.   Objective:   Encounter Medications: Outpatient Encounter Medications as of 12/17/2017  Medication Sig  . acetaminophen (TYLENOL) 325 MG tablet Take 2 tablets (650 mg total) by mouth every 4 (four) hours as needed for headache or mild pain.  Marland Kitchen apixaban (ELIQUIS) 5 MG TABS tablet Take 1 tablet (5 mg total) by mouth 2 (two) times daily.  Marland Kitchen atorvastatin (LIPITOR) 80 MG tablet Take 1 tablet (80 mg total) by mouth daily with lunch.  . Cholecalciferol (VITAMIN D) 2000 UNITS CAPS Take 2,000 Units by mouth daily.   . colchicine 0.6 MG tablet Take 0.6 mg by mouth daily.   Marland Kitchen dronedarone (MULTAQ) 400 MG tablet Take 1 tablet (400 mg total) by mouth 2 (two) times daily with a meal.  . ezetimibe (ZETIA) 10 MG tablet Take 1 tablet (10 mg total) by mouth daily. Please make overdue appt with Dr. Radford Pax before anymore refills. 2nd attempt  . ferrous sulfate 325 (65 FE) MG tablet Take 1 tablet (325 mg total) by mouth 2 (two) times daily with a meal.  . folic acid (FOLVITE) 1 MG tablet Take 1 tablet (1 mg total) by mouth daily.  . furosemide (LASIX) 20 MG tablet Take 1 tablet (20 mg total) by mouth  daily.  Marland Kitchen ipratropium-albuterol (DUONEB) 0.5-2.5 (3) MG/3ML SOLN Take 3 mLs by nebulization 3 (three) times daily. DX: J44.9  . levothyroxine (SYNTHROID, LEVOTHROID) 100 MCG tablet Take 50 mcg by mouth daily before breakfast.   . Macitentan (OPSUMIT) 10 MG TABS Take 1 tablet (10 mg total) by mouth daily.  . Multiple Vitamin (MULTIVITAMIN) capsule Take 1 capsule by mouth daily.  . pantoprazole (PROTONIX) 40 MG tablet Take 1 tablet (40 mg total) by mouth daily.  . predniSONE (DELTASONE) 10 MG tablet Take 1 tablet (10 mg total) by mouth 2 (two) times daily with a meal.  . Selexipag (UPTRAVI) 1600 MCG TABS Take 1,600 mcg by mouth 2 (two) times daily.   . traMADol (ULTRAM) 50 MG tablet Take 50 mg by mouth every 8 (eight) hours as needed for moderate pain.  Marland Kitchen UNABLE TO FIND Med Name: CPAP  [facility]  . vitamin B-12 100 MCG tablet Take 1 tablet (100 mcg total) by mouth daily.   No facility-administered encounter medications on file as of 12/17/2017.     Functional Status: In your present state of health, do you have any difficulty performing the following activities: 11/29/2017  Hearing? N  Vision? N  Difficulty concentrating or making decisions? N  Walking or climbing stairs? N  Dressing or bathing? N  Doing errands, shopping? N  Some recent data might be hidden    Fall/Depression Screening: Fall Risk  12/17/2017 11/07/2016 10/17/2016  Falls in the past year? Yes  No No  Injury with Fall? No - -  Risk for fall due to : History of fall(s) - -   PHQ 2/9 Scores 12/17/2017 09/16/2017 05/07/2017 11/07/2016 10/17/2016 10/13/2016 10/06/2016  PHQ - 2 Score 0 _0 PHQ- 9 Score - - 10 - - 4 4    Assessment: ASSESSMENT: Date Discharged from Hospital: 12/08/2017 Date Medication Reconciliation Performed: 12/17/2017  Medications Discontinued at Discharge: (Delete if not applicable)  cyclobenzaprine 5 MG tablet (FLEXERIL)  metoprolol tartrate 25 MG tablet (LOPRESSOR)  mycophenolate 500 MG tablet  (CELLCEPT)  potassium chloride SA 20 MEQ tablet (K-DUR,KLOR-CON)  XARELTO 15 MG Tabs tablet**  New Medications at Discharge:  apixaban (ELIQUIS)  Cyanocobalamin  ferrous sulfate  folic acid (FOLVITE)   Patient was recently discharged from hospital and all medications have been reviewed   Drugs sorted by system:  Cardiovascular: Eliquis, Atorvastatin, Ezetimibe, Furosemide, Multaq,   Pulmonary/Allergy: Prednisone, Albuterol/Ipratropium, Malvin Johns, Opsumit  Gastrointestinal: Pantoprazole,   Endocrine: Colchicine., Levothyroxine  Renal:  Topical:  Pain: Acetaminophen, tramadol,    Vitamins/Minerals: Cholecalciferol, Ferrous Sulfate, Folic Acid, Multiple Vitamin, Vitamin B12   Medication Review Findings: Levothyroxine- medication list in the patient's Epic chart stated patient was taking Levothyroxine 91mg 1/2 tablet (269m) daily.  Patient actually had Levothyroxine 10070mtablets in her possession and is taking 1/2 of those tablets or 70m37maily. (Medication list was updated).   Medication Questions: -reviewed the cardiology visit note from 12/15/17.  The provider's note mentioned the patient should continue potassium,  Potassium was discontinued at discharge. The patient's potassium was 3.8 on 12/15/17.  Cardiology was called. [facility] the patient is not supposed to take potassium.  Patient was called back and let know.  PLAN: -Route note to the patient's PCP -Make a referral to [facility] for Trans[facility] Care post discharge -Call patient back to check on her in 5-7 days.   KatiElayne GuerinarmD, BCACShilohnical Pharmacist [phone]

## 2017-12-18 ENCOUNTER — Ambulatory Visit (INDEPENDENT_AMBULATORY_CARE_PROVIDER_SITE_OTHER): Payer: Medicare Other | Admitting: Adult Health

## 2017-12-18 ENCOUNTER — Encounter: Payer: Self-pay | Admitting: Adult Health

## 2017-12-18 ENCOUNTER — Other Ambulatory Visit: Payer: Self-pay | Admitting: Cardiology

## 2017-12-18 DIAGNOSIS — D8989 Other specified disorders involving the immune mechanism, not elsewhere classified: Secondary | ICD-10-CM

## 2017-12-18 DIAGNOSIS — I270 Primary pulmonary hypertension: Secondary | ICD-10-CM | POA: Diagnosis not present

## 2017-12-18 DIAGNOSIS — K5791 Diverticulosis of intestine, part unspecified, without perforation or abscess with bleeding: Secondary | ICD-10-CM

## 2017-12-18 DIAGNOSIS — I2510 Atherosclerotic heart disease of native coronary artery without angina pectoris: Secondary | ICD-10-CM

## 2017-12-18 DIAGNOSIS — J9611 Chronic respiratory failure with hypoxia: Secondary | ICD-10-CM

## 2017-12-18 NOTE — Progress Notes (Signed)
_0  ID: Amy [name], female    DOB: 1945-09-10, 72 y.o.   MRN: [id number]  Chief Complaint  Patient presents with  . 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 Past medical history significant for A. fib status post ablation in 2620,BTDHRCB diastolic congestive heart failure, apical hypertrophic cardiomyopathy  TEST Chest imaging: - V/Q scan negative 3/22 June 2017 high-resolution CT scan of the chest images independently reviewed showing patchy groundglass throughout with air trapping but no clear evidence of underlying interstitial lung disease. Some evidence of emphysema seen, motion degraded images  PFT: March 2018 ratio 83%, FVC 1.88 L 89% predicted, significant change with bronchodilator in the small airways, total lung capacity 3.63 L 76% predicted, DLCO 4.75 mL 22% predicted   Labs: - HIV, SCL70 negative. RF borderline elevated, doubt significant. ANA negative.   Echo: Echo (3/18) with EF 65-70%, apical hypertrophic cardiomyopathy, severe LAE, PASP 86 mmHg.  Echo (3/19): EF 65-70%, apical hypertrophic cardiomyopathy, normal RV size and systolic function, severe LAE. 2D echo September 08, 2017 EF 65 to 70%, moderate LVH, grade 3 diastolic dysfunction, severely dilated left atrium.  Pulmonary artery pressure 42 mmHg.  No PFO.  Heart Catheterization: - RHC (3/18): mean RA 4, PA 69/23 mean 38, mean PCWP 8, CI 2.17, PVR 7.3 WU Fick, 9.6 WU thermo. - RHC (3/19): mean RA 6, PA 49/16 mean 29, mean PCWP 10, CI 1.9/PVR 5.3 WU Fick, CI 1.4/PVR 7.3 Thermo.   6 Min walk: 4/18 6 minute walk: 109 m  10/18 6 minute walk: 229 m 2/19 6 minute walk: < 100 m  12/18/2017 Follow up : Pulmonary HTN , O2  RF  Patient presents for a 39-monthfollow-up.  Patient has underlying pulmonary hypertension she is followed by cardiology and is on OPSUMIT and selexipag.  Previous right heart cath March 2019 showed decreased pulmonary artery pressures.  Patient has been undergoing an extensive work-up for progressive shortness of breath and increased oxygen demands despite improved pulmonary hypertension.  PFT showed minimal obstruction or restriction.  But is significantly decreased DLCO.  High-resolution CT chest did not show any evidence of ILD.  Previous VQ scan did not suggest chronic PE.  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  She has been referred to rheumatology.  She was seen by Dr. 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 /pnuemonitis  She was started on prednisone 20 mg.  Along with CellCept and Bactrim.  could not tolerate CellCept.  Initially due to joint swelling.  She was rechallenged last visit with lower dosing but was stopped in hospital due to possible connection with GIB and cellcept   Remains on oxygen 4 L.  She  was admitted last week for rectal bleeding.  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  Patient was seen by GI.  A flexible Sigg on August 26 was unrevealing.  Nuclear scan did not show source of bleeding.  She was treated with IV iron. During hospital stay patient did have a TIA.  CT head showed atrophy and chronic microvascular ischemic changes in the white matter.  MRI brain was negative for acute infarct. She was recommended to resume anticoagulation cautiously.  She was discharged on Eliquis.  Patient says she has had no further bleeding.. Hemoglobin on September 10 was 9.3  Has itchy rash on back . No pain , redness or fever.    Allergies  Allergen Reactions  . Amiodarone     Dyspnea - felt to be 2/2 amio lung toxicity Has tolerated Omnipaque   . Cellcept [Mycophenolate Mofetil] Other  (See Comments)    Gastric Bleeding  . Zyban [Bupropion] Itching    Immunization History  Administered Date(s) Administered  . Influenza Split 01/06/2016  . Influenza Whole 01/20/2017  . Influenza-Unspecified 01/06/2015  . Pneumococcal Polysaccharide-23 04/26/2013    Past Medical History:  Diagnosis Date  . Apical variant hypertrophic cardiomyopathy ([place])    Diagnosed by echo and ECG 11/13  . Atypical atrial flutter ([place])   . CAD (coronary artery disease)    a. LHC in 10/2011 demonstrated non-obstructive disease and an 80% lesion in a small D1 treated medically.   . CHF (congestive heart failure) ([place])   . Chronic diastolic heart failure ([place])   . CKD (chronic kidney disease), stage III ([place])   . COPD (chronic obstructive pulmonary disease) ([place])   . Dizziness    had PT for being off balance - in approx. 2012  . GERD (gastroesophageal reflux disease)   . Hyperlipidemia   . Hypertension   . Hypothyroid   . Lumbar disc disease   . Macular degeneration   . OSA (obstructive sleep apnea)   . Persistent atrial fibrillation ([place])       . Pulmonary hypertension ([place])    a. [place] 05/2013 showed mild PAH with normal PCWP and RA pressure, could be related to OSA and low oxygen saturation.   . Refusal of blood transfusions as patient is Jehovah's Witness   . Sinus bradycardia     Tobacco History: Social History   Tobacco Use  Smoking Status Former Smoker  . Packs/day: 0.50  . Years: 44.00  . Pack years: 22.00  . Types: Cigarettes  . Last attempt to quit: 04/07/2009  . Years since quitting: 8.7  Smokeless Tobacco Never Used   Counseling given: Not Answered   Outpatient Medications Prior to Visit  Medication Sig Dispense Refill  . acetaminophen (TYLENOL) 325 MG tablet Take 2 tablets (650 mg total) by mouth every 4 (four) hours as needed for headache or mild pain.    Marland Kitchen apixaban (ELIQUIS) 5 MG TABS tablet Take 1 tablet (5 mg total) by mouth 2 (two) times daily. 60 tablet 1  .  atorvastatin (LIPITOR) 80 MG tablet Take 1 tablet (80 mg total) by mouth daily with lunch. 90 tablet 3  . Cholecalciferol (VITAMIN D) 2000 UNITS CAPS Take 2,000 Units by mouth daily.     . colchicine 0.6 MG tablet Take 0.6 mg by mouth daily.     Marland Kitchen dronedarone (MULTAQ) 400 MG tablet Take 1 tablet (400 mg total) by mouth 2 (two) times daily with a meal. 60 tablet 0  . ferrous sulfate 325 (65 FE) MG tablet Take 1 tablet (325 mg total) by mouth 2 (two) times daily with a meal. 60 tablet 3  . folic acid (FOLVITE) 1 MG tablet Take 1 tablet (1 mg total) by mouth daily. 30 tablet 0  . furosemide (LASIX) 20 MG tablet Take 1 tablet (20 mg total) by mouth daily. 30 tablet 6  . ipratropium-albuterol (DUONEB) 0.5-2.5 (3) MG/3ML SOLN Take 3 mLs by nebulization 3 (three) times daily. DX: J44.9 360 mL 5  .  levothyroxine (SYNTHROID, LEVOTHROID) 100 MCG tablet Take 50 mcg by mouth daily before breakfast.     . Macitentan (OPSUMIT) 10 MG TABS Take 1 tablet (10 mg total) by mouth daily. 30 tablet 11  . Multiple Vitamin (MULTIVITAMIN) capsule Take 1 capsule by mouth daily.    . pantoprazole (PROTONIX) 40 MG tablet Take 1 tablet (40 mg total) by mouth daily. 90 tablet 2  . predniSONE (DELTASONE) 10 MG tablet Take 1 tablet (10 mg total) by mouth 2 (two) times daily with a meal. 30 tablet 0  . Selexipag (UPTRAVI) 1600 MCG TABS Take 1,600 mcg by mouth 2 (two) times daily.     . traMADol (ULTRAM) 50 MG tablet Take 50 mg by mouth every 8 (eight) hours as needed for moderate pain.    Marland Kitchen UNABLE TO FIND Med Name: CPAP  DME[facility]    . vitamin B-12 100 MCG tablet Take 1 tablet (100 mcg total) by mouth daily. 30 tablet 0  . ezetimibe (ZETIA) 10 MG tablet Take 1 tablet (10 mg total) by mouth daily. Please make overdue appt with Dr. Radford Pax before anymore refills. 2nd attempt 30 tablet 0   No facility-administered medications prior to visit.      Review of Systems  Constitutional:   No  weight loss, night sweats,  Fevers,  chills, + fatigue, or  lassitude.  HEENT:   No headaches,  Difficulty swallowing,  Tooth/dental problems, or  Sore throat,                No sneezing, itching, ear ache, nasal congestion, post nasal drip,   CV:  No chest pain,  Orthopnea, PND, +swelling in lower extremities,  No anasarca, dizziness, palpitations, syncope.   GI  No heartburn, indigestion, abdominal pain, nausea, vomiting, diarrhea, change in bowel habits, loss of appetite, bloody stools.   Resp: No chest wall deformity  Skin: no rash or lesions.  GU: no dysuria, change in color of urine, no urgency or frequency.  No flank pain, no hematuria   MS:  No joint pain or swelling.  No decreased range of motion.  No back pain.    Physical Exam  BP 134/82 (BP Location: Left Arm, Cuff Size: Normal)   Pulse 74   Wt 191 lb 3.2 oz (86.7 kg)   SpO2 96%   BMI 33.87 kg/m   GEN: A/Ox3; pleasant , NAD, obese, in wc on O2    HEENT:  [place]/AT,  EACs-clear, TMs-wnl, NOSE-clear, THROAT-clear, no lesions, no postnasal drip or exudate noted.   NECK:  Supple w/ fair ROM; no JVD; normal carotid impulses w/o bruits; no thyromegaly or nodules palpated; no lymphadenopathy.    RESP  Clear  P & A; w/o, wheezes/ rales/ or rhonchi. no accessory muscle use, no dullness to percussion  CARD:  RRR, no m/r/g, 1-2+ peripheral edema, pulses intact, no cyanosis or clubbing.  GI:   Soft & nt; nml bowel sounds; no organomegaly or masses detected.   Musco: Warm bil, no deformities or joint swelling noted.   Neuro: alert, no focal deficits noted.    Skin: Warm, no lesions or rashes    Lab Results:  CBC   BNPProBNP  Imaging: Dg Chest 2 View  Result Date: 11/29/2017 CLINICAL DATA:  Shortness of breath.  Chest discomfort. EXAM: CHEST - 2 VIEW COMPARISON:  Radiograph 10/06/2017.  CT 06/08/2017 FINDINGS: Cardiomegaly is unchanged from prior exam. Unchanged mediastinal contours with aortic atherosclerosis. No pulmonary edema. Scarring in the  left mid  lung. No focal airspace disease or pleural effusion. No pneumothorax. Unchanged osseous structures. IMPRESSION: Chronic cardiomegaly and Aortic Atherosclerosis (ICD10-I70.0). No CHF or acute pulmonary process. Electronically Signed   By: Jeb Levering M.D.   On: 11/29/2017 06:22   Ct Head Wo Contrast  Result Date: 12/05/2017 CLINICAL DATA:  Speech abnormality EXAM: CT HEAD WITHOUT CONTRAST TECHNIQUE: Contiguous axial images were obtained from the base of the skull through the vertex without intravenous contrast. COMPARISON:  None. FINDINGS: Brain: Mild to moderate atrophy. Patchy hypodensity throughout the cerebral white matter bilaterally appears chronic. Negative for acute infarct, hemorrhage, or mass Vascular: Negative for hyperdense vessel Skull: Negative Sinuses/Orbits: paranasal sinuses clear. Bilateral cataract surgery. Other: None IMPRESSION: Atrophy and chronic microvascular ischemic changes in the white matter. No acute infarct or hemorrhage. Electronically Signed   By: Franchot Gallo M.D.   On: 12/05/2017 17:00   Mr Brain Wo Contrast  Result Date: 12/05/2017 CLINICAL DATA:  Speech difficulty EXAM: MRI HEAD WITHOUT CONTRAST TECHNIQUE: Multiplanar, multiecho pulse sequences of the brain and surrounding structures were obtained without intravenous contrast. COMPARISON:  CT head 12/05/2017 FINDINGS: Brain: Negative for acute infarct. Mild chronic microvascular ischemic change in the white matter. Ventricle size normal. Cerebral volume normal. Vascular: T1 shortening of the arteries and veins diffusely in the brain. Patient did not receive intravenous gadolinium. Vessels are markedly low signal on gradient echo imaging. This is most consistent with intravenous iron infusion therapy. The patient does have GI bleeding and anemia. Correlate with history of iron fusion. Skull and upper cervical spine: Cervical spondylosis. Sinuses/Orbits: Mild mucosal edema paranasal sinuses. Mild mucosal edema  in the mastoid sinus bilaterally. Bilateral cataract surgery Other: None IMPRESSION: Negative for acute infarct. Mild chronic microvascular ischemia in the white matter. Diffuse vascular abnormality most consistent with intravenous iron infusion therapy. Correlate with history. Electronically Signed   By: Franchot Gallo M.D.   On: 12/05/2017 18:03   Nm Gi Blood Loss  Result Date: 11/30/2017 CLINICAL DATA:  72 year old female with rectal bleeding. EXAM: NUCLEAR MEDICINE GASTROINTESTINAL BLEEDING SCAN TECHNIQUE: Sequential abdominal images were obtained following intravenous administration of Tc-47mlabeled red blood cells. RADIOPHARMACEUTICALS:  25.8 mCi Tc-949mertechnetate in-vitro labeled red cells. COMPARISON:  None. FINDINGS: No evidence of active gastrointestinal bleeding after 2 hours of imaging. Normal renal uptake and excretion into the bladder. IMPRESSION: Negative GI bleeding study. Electronically Signed   By: HeJacqulynn Cadet.D.   On: 11/30/2017 16:56     Assessment & Plan:   Pulmonary hypertension, primary (HCYoderCurrently stable on present regimen  Cont rx . Keep O2 sats >90%, and cont on diuretics.    GIB (gastrointestinal bleeding) Recent admission , no further bleeding since discharge . Hbg stable on recent labs.  High risk as on anticoagulation and Jehovah witness .  Keep follow up with PCP and GI   Chronic respiratory failure with hypoxia (HCBristow CoveCont on O2 to keep O2 >90%.   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  For now will allow her to get stronger and bounce back from recent admission  Has PFT on return. Pending those results may need repeat HRCT chest (last 06/2017 )  If unable to tolerate immunosuppression [facility] .       TaRexene EdisonNP 12/18/2017

## 2017-12-18 NOTE — Patient Instructions (Signed)
Remain off Cellcept .  Continue on Oxygen 4l/m rest and 6 l/m activity .  Remain on Prednisone 40m daily  Follow up with Dr. MLake Bellsin 2 weeks as planned and As needed   Please contact office for sooner follow up if symptoms do not improve or worsen or seek emergency care

## 2017-12-18 NOTE — Assessment & Plan Note (Signed)
Recent admission , no further bleeding since discharge . Hbg stable on recent labs.  High risk as on anticoagulation and Jehovah witness .  Keep follow up with PCP and GI

## 2017-12-18 NOTE — Assessment & Plan Note (Signed)
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  For now will allow her to get stronger and bounce back from recent admission  Has PFT on return. Pending those results may need repeat HRCT chest (last 06/2017 )  If unable to tolerate immunosuppression would consider referral to [facility] for second opinion .

## 2017-12-18 NOTE — Assessment & Plan Note (Signed)
Cont on O2 to keep O2 >90%.

## 2017-12-18 NOTE — Assessment & Plan Note (Signed)
Currently stable on present regimen  Cont rx . Keep O2 sats >90%, and cont on diuretics.

## 2017-12-21 ENCOUNTER — Other Ambulatory Visit: Payer: Self-pay | Admitting: *Deleted

## 2017-12-21 DIAGNOSIS — F0631 Mood disorder due to known physiological condition with depressive features: Secondary | ICD-10-CM | POA: Diagnosis not present

## 2017-12-21 NOTE — Patient Outreach (Signed)
[place] [facility]) Care Management  12/21/2017  Amy [name] [birth year] [id number]  Transition of Care (initial)  Spoke with pt today and verified identifiers along with purpose for today call. Pt aware and has a history with RN case manager in the past. RN reports a recent discharged from the hospital due to a reaction to a medication (CellCept) resulting in rectal bleeding. Pt states she has had some follow up appointments and pending several other this month. States recent office visit on Friday felt very weak and almost "passed out" with sats at 88%. States she is weak and getting her strength back slowly but feels she stills needs someone to check on her stability.  States she is now on her home oxygen _0  liters continuously and uses her CPAP at night.  Pt states she H/H was low (currently 9.3)  and she is a Jehovah Witness and will not accept a blood transfusion. States her medications have been altered by her provider and most recent is a rash/blister on her back that her provider is aware of on an office visit last Friday  (pulmonologist). Currently watching the site if it gets worse pt aware to contact her provider. Pt has a BP device to monitoring her BP indicating it was 134/82 with no major issues.  Pt reports she has met with [facility] and has be evaluated but has not completed the process. Pt states she will continue the representative and inquire further on her status for further transportation services. Pt also reports she has hired an Engineer, production in the home to assist as needed.  Pt states based upon all the events she goes to see a psychiatrist for counseling when needed.  Discussed a potential plan of care however pt wish to discuss more tomorrow on the initial home visit. RN will completed a definite plan of care on tomorrow and complete the initial assessment at that time.  Patient was recently discharged from hospital and all medications have been  reviewed.  [name], RN Care Management Coordinator [place] Office [phone]

## 2017-12-22 ENCOUNTER — Other Ambulatory Visit: Payer: Self-pay | Admitting: *Deleted

## 2017-12-22 NOTE — Patient Outreach (Signed)
[facility]) Care Management   12/22/2017  Amy [name] Amy [name] Nov 24, 1945 [id number]  Spirit Wernli Amy [name] is an 72 y.o. female  Subjective:  CONSENT: Pt has agreed to sign a consent for [place]: pt repoorts this was ordered last week and currently pending a call for arranging a home visit for PT/RN and possible aide services. FALLS: Pt reports some ongoing weakness but no falls and continues to use her rolling walker, rollator on outings and wheelchair. Pt states she gets dizziness a few seconds after standing but aware that she needs to waiting until this passes before ambulating. Pt has reported all assisted devices throughout the home and in the bathroom to assist with fall preventions. Pt also has hired help, her daughter visiting to assist and back family and other aids to help when needed.  COMMUNITY RESOURCES: Pt has requested a referral to a Education officer, museum for possible food services. EDEMA: Pt aware of her bilateral edema and tries to elevate her LE as much as possible throughout the day. Pt is aware of compression stockings but has not applied. BP DEVICE: Pt has requested troubleshooting on her home device indicated it's reading an ERROR message.   Objective:   Review of Systems  Constitutional: Negative.   HENT: Negative.   Eyes: Negative.   Respiratory: Negative.   Cardiovascular: Negative.   Gastrointestinal: Negative.   Genitourinary: Negative.   Musculoskeletal: Negative.   Skin:       Rash like area on lower back with blisters. (MD aware)  Neurological: Positive for weakness.       Post op hospital discharge related  Endo/Heme/Allergies: Negative.   Psychiatric/Behavioral: Negative.        Pt has a psychologist for ongoing counseling when needed.    Physical Exam  Constitutional: She is oriented to person, place, and time. She appears well-developed and well-nourished.  HENT:  Right Ear: External ear normal.  Left Ear: External  ear normal.  Eyes: EOM are normal.  Neck: Normal range of motion.  Cardiovascular: Normal heart sounds.  Respiratory: Effort normal and breath sounds normal.  GI: Soft. Bowel sounds are normal.  Musculoskeletal: She exhibits edema.  Bilateral edema to LE with left 3+ and right 2+  Neurological: She is alert and oriented to person, place, and time.  Skin: Skin is warm and dry.  Psychiatric: She has a normal mood and affect. Her behavior is normal. Judgment and thought content normal.    Encounter Medications:   Outpatient Encounter Medications as of 12/22/2017  Medication Sig  . acetaminophen (TYLENOL) 325 MG tablet Take 2 tablets (650 mg total) by mouth every 4 (four) hours as needed for headache or mild pain.  Marland Kitchen apixaban (ELIQUIS) 5 MG TABS tablet Take 1 tablet (5 mg total) by mouth 2 (two) times daily.  Marland Kitchen atorvastatin (LIPITOR) 80 MG tablet Take 1 tablet (80 mg total) by mouth daily with lunch.  . Cholecalciferol (VITAMIN D) 2000 UNITS CAPS Take 2,000 Units by mouth daily.   . colchicine 0.6 MG tablet Take 0.6 mg by mouth daily.   Marland Kitchen dronedarone (MULTAQ) 400 MG tablet Take 1 tablet (400 mg total) by mouth 2 (two) times daily with a meal.  . ezetimibe (ZETIA) 10 MG tablet Take 1 tablet (10 mg total) by mouth daily. Patient overdue for appt w/Dr Turner,must call and schedule for further refills3rd/final attempt  . ferrous sulfate 325 (65 FE) MG tablet Take 1 tablet (325 mg total) by mouth 2 (two) times  daily with a meal.  . folic acid (FOLVITE) 1 MG tablet Take 1 tablet (1 mg total) by mouth daily.  . furosemide (LASIX) 20 MG tablet Take 1 tablet (20 mg total) by mouth daily.  Marland Kitchen ipratropium-albuterol (DUONEB) 0.5-2.5 (3) MG/3ML SOLN Take 3 mLs by nebulization 3 (three) times daily. DX: J44.9  . levothyroxine (SYNTHROID, LEVOTHROID) 100 MCG tablet Take 50 mcg by mouth daily before breakfast.   . Macitentan (OPSUMIT) 10 MG TABS Take 1 tablet (10 mg total) by mouth daily.  . Multiple Vitamin  (MULTIVITAMIN) capsule Take 1 capsule by mouth daily.  . pantoprazole (PROTONIX) 40 MG tablet Take 1 tablet (40 mg total) by mouth daily.  . predniSONE (DELTASONE) 10 MG tablet Take 1 tablet (10 mg total) by mouth 2 (two) times daily with a meal.  . Selexipag (UPTRAVI) 1600 MCG TABS Take 1,600 mcg by mouth 2 (two) times daily.   . traMADol (ULTRAM) 50 MG tablet Take 50 mg by mouth every 8 (eight) hours as needed for moderate pain.  Marland Kitchen UNABLE TO FIND Med Name: CPAP  DME[facility]  . vitamin B-12 100 MCG tablet Take 1 tablet (100 mcg total) by mouth daily.   No facility-administered encounter medications on file as of 12/22/2017.     Functional Status:   In your present state of health, do you have any difficulty performing the following activities: 12/21/2017 11/29/2017  Hearing? N N  Vision? N N  Difficulty concentrating or making decisions? N N  Walking or climbing stairs? Y N  Dressing or bathing? N N  Doing errands, shopping? Y N  Preparing Food and eating ? Y -  Using the Toilet? N -  In the past six months, have you accidently leaked urine? N -  Do you have problems with loss of bowel control? N -  Managing your Medications? N -  Managing your Finances? N -  Housekeeping or managing your Housekeeping? Y -  Some recent data might be hidden    Fall/Depression Screening:    Fall Risk  12/21/2017 12/17/2017 11/07/2016  Falls in the past year? No Yes No  Injury with Fall? - No -  Risk for fall due to : - History of fall(s) -   PHQ 2/9 Scores 12/21/2017 12/17/2017 09/16/2017 05/07/2017 11/07/2016 10/17/2016 10/13/2016  PHQ - 2 Score 0 0 _0 PHQ- 9 Score - - - 10 - - 4   BP 106/60 (BP Location: Left Arm, Patient Position: Sitting, Cuff Size: Normal)   Pulse 63   Resp 20   Ht 1.575 m (5' 2")   Wt 189 lb (85.7 kg)   SpO2 98%   BMI 34.57 kg/m   Assessment:   Signed consent for [facility] services [facility] related to RN/PT Case management related to falls/weakness post hospital  discharge Bilateral edema related to [place] resources for food services related to a social work referral Troubleshoot home BP device related to error message  Plan:  Will obtain a signed consent for [facility] services. Will confirm pt has contact number for the [facility] that was recent ordered on Friday. Currently pending a call for home visit for PT/RN. Will continue to follow up on these arrangements with Transition of care calls. Will discussed safety prevention measures and making some changes in the home such as removing runners on carpeted areas in the living room. Will discussed printed material for Fall prevention and Fall prevention in older adults related to things to avoid risk  of falls/injuries. Will also provide [facility]  calendar for monitoring tools on diease management and to stay organized with medical appointments via scheduled dates.  Will also verify ongoing discharged medications and review all upcoming scheduled appointments. Will verify pt has sufficient transportation to all her upcoming appointments. Will stress the importance of elevating her bilateral LE to reduce ongoing swelling. Will also discuss compression stockings and offer available resources to reduce the cost of compression stockings Occupational hygienist Therapy warehouse  in [place] ,[place] Will provider address and contact number for pt's use. Will discuss the risk involved if not monitored and if no interventions are initiated.  Will make a referral for a social worker to contact pt for resources for food services. Note pt recently discharged on 9/3.  Will obtain a manuel BP 106/60 Right arm via [facility] RN and use pt's home device 107/72 Right arm. Will verify readings are related and device is calibrated correctly.  Will notify primary provider with today's home visit assessment and scheduled next in Oct and continue to complete the transition of care calls over the next few weeks.   THN CM Care Plan Problem One     Most Recent  Value  Care Plan Problem One  Weakness related to recent hospital discharge  Role Documenting the Problem One  Care Management [place] for Problem One  Active  THN Long Term Goal   Pt will use her DME to avoid risk of falls/injuries over the next 90 days.  THN Long Term Goal Start Date  12/22/17  Interventions for Problem One Long Term Goal  Will discussed use of  DME when  ambulating to avoid risk of falls.  THN CM Short Term Goal #1   Pt will take all discharge medications as prescribed over the next 30 days.  THN CM Short Term Goal #1 Start Date  12/22/17  Interventions for Short Term Goal #1  Will verify medication supply and stress the importance of taking all her medications as prescribed to avoid acute issues from occurring.  THN CM Short Term Goal #2   Pt will attend all medical appointments as scheduled over the next 30 days.  THN CM Short Term Goal #2 Start Date  12/22/17  Interventions for Short Term Goal #2  Will strongly enocuraged adherence with all scheduled medical appointments over the next 30 days.    [facility] CM Care Plan Problem Two     Most Recent Value  Care Plan Problem Two  Bilateral edema  Role Documenting the Problem Two  Care Management Coordinator  Care Plan for Problem Two  Active  THN CM Short Term Goal #1   Pt will elevate her bilateral legs to reduce ongoing swelling over the next 30 days.   THN CM Short Term Goal #1 Start Date  12/22/17  Interventions for Short Term Goal #2   Will discuss the importance of reduce bialteral swelling to avoid ongoing weakness and risk of falls.     [facility] CM Care Plan Problem Three     Most Recent Value  Care Plan Problem Three  Community resources requested for food services  Role Documenting the Problem Three  Care Management [place] for Problem Three  Active  THN CM Short Term Goal #1   Pt will be receptive to the social worker referral for food resources within the next 3 weeks.  THN CM Short Term  Goal #1 Start Date  12/22/17  Interventions for Short Term Goal #  1  Will make a referral for social worker to contact pt conerning food resources in the home based upon her recent discharge from the hospital.        [name], RN Care Management Coordinator [place] Office [phone]

## 2017-12-23 ENCOUNTER — Other Ambulatory Visit: Payer: Self-pay | Admitting: Adult Health

## 2017-12-23 DIAGNOSIS — Z8719 Personal history of other diseases of the digestive system: Secondary | ICD-10-CM | POA: Diagnosis not present

## 2017-12-23 DIAGNOSIS — D500 Iron deficiency anemia secondary to blood loss (chronic): Secondary | ICD-10-CM | POA: Diagnosis not present

## 2017-12-23 DIAGNOSIS — I480 Paroxysmal atrial fibrillation: Secondary | ICD-10-CM | POA: Diagnosis not present

## 2017-12-23 DIAGNOSIS — I509 Heart failure, unspecified: Secondary | ICD-10-CM | POA: Diagnosis not present

## 2017-12-24 ENCOUNTER — Other Ambulatory Visit: Payer: Self-pay | Admitting: Pharmacist

## 2017-12-24 NOTE — Patient Outreach (Signed)
Amy [name]) Care Management  12/24/2017  Amy [name] May 21, 1945 [id number]   Patient was called for follow up . HIPAA identifiers were obtained.  Patient confirmed she is taking her medications as instructed and has not had any more falls.  She is currently in [place] Case Management and is being followed closely by Amy Mina, RN.  Patient stated she is still waiting to hear from [place] about her initial assessment for services with their agency.  She was encouraged to give them a call ASAP.  Plan: Follow up with patient in 10-14 days. Consider closing the pharmacy case.   Elayne [name], PharmD, [place] Clinical Pharmacist [phone]

## 2017-12-27 DIAGNOSIS — J9611 Chronic respiratory failure with hypoxia: Secondary | ICD-10-CM | POA: Diagnosis not present

## 2017-12-27 DIAGNOSIS — D8989 Other specified disorders involving the immune mechanism, not elsewhere classified: Secondary | ICD-10-CM | POA: Diagnosis not present

## 2017-12-27 DIAGNOSIS — I422 Other hypertrophic cardiomyopathy: Secondary | ICD-10-CM | POA: Diagnosis not present

## 2017-12-27 DIAGNOSIS — I5032 Chronic diastolic (congestive) heart failure: Secondary | ICD-10-CM | POA: Diagnosis not present

## 2017-12-27 DIAGNOSIS — Z7901 Long term (current) use of anticoagulants: Secondary | ICD-10-CM | POA: Diagnosis not present

## 2017-12-27 DIAGNOSIS — J449 Chronic obstructive pulmonary disease, unspecified: Secondary | ICD-10-CM | POA: Diagnosis not present

## 2017-12-27 DIAGNOSIS — I2510 Atherosclerotic heart disease of native coronary artery without angina pectoris: Secondary | ICD-10-CM | POA: Diagnosis not present

## 2017-12-27 DIAGNOSIS — N183 Chronic kidney disease, stage 3 (moderate): Secondary | ICD-10-CM | POA: Diagnosis not present

## 2017-12-27 DIAGNOSIS — I272 Pulmonary hypertension, unspecified: Secondary | ICD-10-CM | POA: Diagnosis not present

## 2017-12-27 DIAGNOSIS — I130 Hypertensive heart and chronic kidney disease with heart failure and stage 1 through stage 4 chronic kidney disease, or unspecified chronic kidney disease: Secondary | ICD-10-CM | POA: Diagnosis not present

## 2017-12-27 DIAGNOSIS — K5791 Diverticulosis of intestine, part unspecified, without perforation or abscess with bleeding: Secondary | ICD-10-CM | POA: Diagnosis not present

## 2017-12-27 DIAGNOSIS — Z87891 Personal history of nicotine dependence: Secondary | ICD-10-CM | POA: Diagnosis not present

## 2017-12-27 DIAGNOSIS — Z9981 Dependence on supplemental oxygen: Secondary | ICD-10-CM | POA: Diagnosis not present

## 2017-12-28 ENCOUNTER — Other Ambulatory Visit: Payer: Self-pay | Admitting: Pulmonary Disease

## 2017-12-28 ENCOUNTER — Other Ambulatory Visit: Payer: Self-pay

## 2017-12-28 DIAGNOSIS — I270 Primary pulmonary hypertension: Secondary | ICD-10-CM

## 2017-12-28 DIAGNOSIS — Z0000 Encounter for general adult medical examination without abnormal findings: Secondary | ICD-10-CM | POA: Diagnosis not present

## 2017-12-28 DIAGNOSIS — E441 Mild protein-calorie malnutrition: Secondary | ICD-10-CM | POA: Diagnosis not present

## 2017-12-28 DIAGNOSIS — B029 Zoster without complications: Secondary | ICD-10-CM | POA: Diagnosis not present

## 2017-12-28 DIAGNOSIS — E785 Hyperlipidemia, unspecified: Secondary | ICD-10-CM | POA: Diagnosis not present

## 2017-12-28 DIAGNOSIS — D62 Acute posthemorrhagic anemia: Secondary | ICD-10-CM | POA: Diagnosis not present

## 2017-12-28 DIAGNOSIS — J449 Chronic obstructive pulmonary disease, unspecified: Secondary | ICD-10-CM | POA: Diagnosis not present

## 2017-12-28 DIAGNOSIS — E039 Hypothyroidism, unspecified: Secondary | ICD-10-CM | POA: Diagnosis not present

## 2017-12-28 DIAGNOSIS — I480 Paroxysmal atrial fibrillation: Secondary | ICD-10-CM | POA: Diagnosis not present

## 2017-12-28 DIAGNOSIS — N183 Chronic kidney disease, stage 3 (moderate): Secondary | ICD-10-CM | POA: Diagnosis not present

## 2017-12-28 DIAGNOSIS — Z23 Encounter for immunization: Secondary | ICD-10-CM | POA: Diagnosis not present

## 2017-12-28 NOTE — Patient Outreach (Signed)
[facility]) Care Management  12/28/2017  Amy [name] [birth year] [id number]  Return call from the patient, HIPAA identifiers confirmed. The patient indicates she is interested in a mobile meals delivery program due to concerns with falls. BSW spoke with the patient about the extensive wait list for the meals on wheels program. The patient indicated she is willing to pay out of pocket for this service pending the cost.  BSW placed a called to Roselind Messier, mobile meals coordinator for [employer] [place]. BSW left a HIPAA compliant voice message requesting a return call.  Plan: BSW will follow up with the patient within the next week.  Daneen Schick, BSW, CDP [facility] [phone]

## 2017-12-28 NOTE — Patient Outreach (Signed)
[facility]) Care Management  12/28/2017  Amy [name] July 28, 1945 [id number]  BSW attempted to contact the patient on today's date to conduct a community resource consult. Unfortunately, today's call was unsuccessful. BSW left the patient a HIPAA compliant voice message requesting a return call.   Plan: BSW will mail the patient an unsuccessful outreach letter. BSW will attempt the patient again within the next four business days.  Daneen Schick, BSW, CDP [facility] [phone]

## 2017-12-29 ENCOUNTER — Other Ambulatory Visit: Payer: Self-pay

## 2017-12-29 DIAGNOSIS — I272 Pulmonary hypertension, unspecified: Secondary | ICD-10-CM | POA: Diagnosis not present

## 2017-12-29 DIAGNOSIS — D8989 Other specified disorders involving the immune mechanism, not elsewhere classified: Secondary | ICD-10-CM | POA: Diagnosis not present

## 2017-12-29 DIAGNOSIS — K5791 Diverticulosis of intestine, part unspecified, without perforation or abscess with bleeding: Secondary | ICD-10-CM | POA: Diagnosis not present

## 2017-12-29 DIAGNOSIS — J9611 Chronic respiratory failure with hypoxia: Secondary | ICD-10-CM | POA: Diagnosis not present

## 2017-12-29 DIAGNOSIS — I2510 Atherosclerotic heart disease of native coronary artery without angina pectoris: Secondary | ICD-10-CM | POA: Diagnosis not present

## 2017-12-29 DIAGNOSIS — I130 Hypertensive heart and chronic kidney disease with heart failure and stage 1 through stage 4 chronic kidney disease, or unspecified chronic kidney disease: Secondary | ICD-10-CM | POA: Diagnosis not present

## 2017-12-29 NOTE — Patient Outreach (Signed)
[facility]) Care Management  12/29/2017  Amy [name] 1945-05-05 [id number]  Successful call to the patient on today's date, HIPAA identifiers confirmed. BSW spoke with the patient about the option to enroll in meals on wheels and pay privately while on waiting list. BSW informed patient of cost of this program. The patient expressed interest in this option. BSW provided the patient with Altha Harm Zecca's contact information in order to allow the patient to gather more information and enroll in this program.  Plan: BSW will contact the patient within the next week the ensure enrollment in program prior to performing a discipline closure. The patient is in agreement with this plan.  Daneen Schick, BSW, CDP [facility] [phone]

## 2017-12-30 DIAGNOSIS — F0631 Mood disorder due to known physiological condition with depressive features: Secondary | ICD-10-CM | POA: Diagnosis not present

## 2017-12-31 ENCOUNTER — Ambulatory Visit: Payer: Medicare Other

## 2018-01-01 ENCOUNTER — Ambulatory Visit (INDEPENDENT_AMBULATORY_CARE_PROVIDER_SITE_OTHER): Payer: Medicare Other | Admitting: Pulmonary Disease

## 2018-01-01 ENCOUNTER — Ambulatory Visit (HOSPITAL_COMMUNITY)
Admission: RE | Admit: 2018-01-01 | Discharge: 2018-01-01 | Disposition: A | Discharge status: Home / Self Care | Payer: Medicare Other | Source: Ambulatory Visit | Attending: Cardiology | Admitting: Cardiology

## 2018-01-01 ENCOUNTER — Other Ambulatory Visit (INDEPENDENT_AMBULATORY_CARE_PROVIDER_SITE_OTHER): Payer: Medicare Other

## 2018-01-01 ENCOUNTER — Encounter: Payer: Self-pay | Admitting: Pulmonary Disease

## 2018-01-01 VITALS — BP 104/60 | HR 67 | Ht 62.0 in | Wt 194.0 lb

## 2018-01-01 VITALS — BP 112/70 | HR 64 | Wt 194.4 lb

## 2018-01-01 DIAGNOSIS — Z5181 Encounter for therapeutic drug level monitoring: Secondary | ICD-10-CM | POA: Diagnosis not present

## 2018-01-01 DIAGNOSIS — R9431 Abnormal electrocardiogram [ECG] [EKG]: Secondary | ICD-10-CM | POA: Diagnosis not present

## 2018-01-01 DIAGNOSIS — I480 Paroxysmal atrial fibrillation: Secondary | ICD-10-CM

## 2018-01-01 DIAGNOSIS — D8989 Other specified disorders involving the immune mechanism, not elsewhere classified: Secondary | ICD-10-CM | POA: Diagnosis not present

## 2018-01-01 DIAGNOSIS — Z7901 Long term (current) use of anticoagulants: Secondary | ICD-10-CM | POA: Insufficient documentation

## 2018-01-01 DIAGNOSIS — J849 Interstitial pulmonary disease, unspecified: Secondary | ICD-10-CM | POA: Diagnosis not present

## 2018-01-01 DIAGNOSIS — G4733 Obstructive sleep apnea (adult) (pediatric): Secondary | ICD-10-CM | POA: Insufficient documentation

## 2018-01-01 DIAGNOSIS — I2721 Secondary pulmonary arterial hypertension: Secondary | ICD-10-CM | POA: Diagnosis not present

## 2018-01-01 DIAGNOSIS — E039 Hypothyroidism, unspecified: Secondary | ICD-10-CM | POA: Insufficient documentation

## 2018-01-01 DIAGNOSIS — Z9889 Other specified postprocedural states: Secondary | ICD-10-CM | POA: Diagnosis not present

## 2018-01-01 DIAGNOSIS — I517 Cardiomegaly: Secondary | ICD-10-CM | POA: Diagnosis not present

## 2018-01-01 DIAGNOSIS — I5032 Chronic diastolic (congestive) heart failure: Secondary | ICD-10-CM | POA: Insufficient documentation

## 2018-01-01 DIAGNOSIS — J9611 Chronic respiratory failure with hypoxia: Secondary | ICD-10-CM | POA: Diagnosis not present

## 2018-01-01 DIAGNOSIS — I270 Primary pulmonary hypertension: Secondary | ICD-10-CM | POA: Diagnosis not present

## 2018-01-01 DIAGNOSIS — J449 Chronic obstructive pulmonary disease, unspecified: Secondary | ICD-10-CM | POA: Diagnosis not present

## 2018-01-01 DIAGNOSIS — I2510 Atherosclerotic heart disease of native coronary artery without angina pectoris: Secondary | ICD-10-CM | POA: Insufficient documentation

## 2018-01-01 DIAGNOSIS — K219 Gastro-esophageal reflux disease without esophagitis: Secondary | ICD-10-CM | POA: Insufficient documentation

## 2018-01-01 DIAGNOSIS — Z87891 Personal history of nicotine dependence: Secondary | ICD-10-CM | POA: Insufficient documentation

## 2018-01-01 DIAGNOSIS — Z8249 Family history of ischemic heart disease and other diseases of the circulatory system: Secondary | ICD-10-CM | POA: Insufficient documentation

## 2018-01-01 DIAGNOSIS — Z79899 Other long term (current) drug therapy: Secondary | ICD-10-CM | POA: Insufficient documentation

## 2018-01-01 DIAGNOSIS — I422 Other hypertrophic cardiomyopathy: Secondary | ICD-10-CM | POA: Insufficient documentation

## 2018-01-01 LAB — COMPREHENSIVE METABOLIC PANEL
ALBUMIN: 3.5 g/dL (ref 3.5–5.2)
ALT: 101 U/L — AB (ref 0–35)
AST: 32 U/L (ref 0–37)
Alkaline Phosphatase: 46 U/L (ref 39–117)
BUN: 32 mg/dL — AB (ref 6–23)
CHLORIDE: 105 meq/L (ref 96–112)
CO2: 26 meq/L (ref 19–32)
Calcium: 8 mg/dL — ABNORMAL LOW (ref 8.4–10.5)
Creatinine, Ser: 1.3 mg/dL — ABNORMAL HIGH (ref 0.40–1.20)
GFR: 51.68 mL/min — ABNORMAL LOW (ref >60.00)
GLUCOSE: 116 mg/dL — AB (ref 70–99)
Potassium: 3.8 mEq/L (ref 3.5–5.1)
Sodium: 137 mEq/L (ref 135–145)
TOTAL PROTEIN: 5.9 g/dL — AB (ref 6.0–8.3)
Total Bilirubin: 0.4 mg/dL (ref 0.2–1.2)

## 2018-01-01 LAB — CBC WITH DIFFERENTIAL/PLATELET
BASOS PCT: 0.7 % (ref 0.0–3.0)
Basophils Absolute: 0 10*3/uL (ref 0.0–0.1)
EOS ABS: 0 10*3/uL (ref 0.0–0.7)
Eosinophils Relative: 0.3 % (ref 0.0–5.0)
HCT: 34.6 % — ABNORMAL LOW (ref 36.0–46.0)
Hemoglobin: 11.1 g/dL — ABNORMAL LOW (ref 12.0–15.0)
LYMPHS ABS: 0.3 10*3/uL — AB (ref 0.7–4.0)
Lymphocytes Relative: 4.5 % — ABNORMAL LOW (ref 12.0–46.0)
MCHC: 32 g/dL (ref 30.0–36.0)
MCV: 100.6 fl — ABNORMAL HIGH (ref 78.0–100.0)
MONO ABS: 0.6 10*3/uL (ref 0.1–1.0)
Monocytes Relative: 10.7 % (ref 3.0–12.0)
NEUTROS ABS: 5 10*3/uL (ref 1.4–7.7)
Neutrophils Relative %: 83.8 % — ABNORMAL HIGH (ref 43.0–77.0)
PLATELETS: 229 10*3/uL (ref 150.0–400.0)
RBC: 3.44 Mil/uL — ABNORMAL LOW (ref 3.87–5.11)
RDW: 22.7 % — AB (ref 11.5–15.5)
WBC: 5.9 10*3/uL (ref 4.0–10.5)

## 2018-01-01 LAB — PULMONARY FUNCTION TEST
DL/VA % PRED: 45 %
DL/VA: 2.06 ml/min/mmHg/L
DLCO UNC: 7.05 ml/min/mmHg
DLCO cor % pred: 38 %
DLCO cor: 8.32 ml/min/mmHg
DLCO unc % pred: 32 %
FEF 25-75 Pre: 0.95 L/sec
FEF2575-%Pred-Pre: 65 %
FEV1-%Pred-Pre: 83 %
FEV1-PRE: 1.33 L
FEV1FVC-%Pred-Pre: 95 %
FEV6-%PRED-PRE: 91 %
FEV6-Pre: 1.81 L
FEV6FVC-%Pred-Pre: 104 %
FVC-%Pred-Pre: 87 %
FVC-Pre: 1.81 L
PRE FEV1/FVC RATIO: 73 %
Pre FEV6/FVC Ratio: 100 %
RV % PRED: 84 %
RV: 1.81 L
TLC % PRED: 84 %
TLC: 4.02 L

## 2018-01-01 LAB — CK: Total CK: 196 U/L — ABNORMAL HIGH (ref 7–177)

## 2018-01-01 MED ORDER — FUROSEMIDE 20 MG PO TABS: 40.0000 mg | ORAL_TABLET | Freq: Every day | ORAL | 6 refills | Status: DC

## 2018-01-01 MED ORDER — POTASSIUM CHLORIDE CRYS ER 20 MEQ PO TBCR: 20.0000 meq | EXTENDED_RELEASE_TABLET | Freq: Every day | ORAL | 6 refills | Status: DC

## 2018-01-01 NOTE — Progress Notes (Signed)
Synopsis: Patient of Dr. 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  She also has diastolic heart failure.   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   Subjective:   PATIENT ID: Amy [name] GENDER: female DOB: 1945/06/01, MRN: [id number]   HPI  Chief Complaint  Patient presents with  . Follow-up    2 mo F/U with BQ. Saw TP last month for a hospital follow up. Increased fatigue.    She was hospitalized in August 2019 for GI bleeding.  A flexible sigmoidoscopy showed evidence of a nonbleeding erosion in the ascending colon, recent bleeding from the sigmoid colon diverticuli at 35 to 40 cm also large internal hemorrhoids.  She says that this started after she increased her cellcept to 1055m bid for about three days.    She says that the night prior to the episode she had some chest pain and indigestion.  She went to the ER and passed more blood.      Past Medical History:  Diagnosis Date  . Apical variant hypertrophic cardiomyopathy ([place]    Diagnosed by echo and ECG 11/13  . Atypical atrial flutter ([place]   . CAD (coronary artery disease)    a. LHC in 10/2011 demonstrated non-obstructive disease and an 80% lesion in a small D1 treated medically.   . CHF (congestive heart failure) ([place]   . Chronic diastolic heart failure ([place]   . CKD (chronic kidney disease), stage III ([place]   . COPD (chronic obstructive pulmonary disease) ([place]   . Dizziness    had PT for being off balance - in approx. 2012  . GERD (gastroesophageal reflux disease)   . Hyperlipidemia   . Hypertension   . Hypothyroid   . Lumbar disc disease   . Macular degeneration   . OSA (obstructive sleep apnea)   . Persistent atrial fibrillation ([place]       . Pulmonary hypertension ([place]    a. [place]2/2015 showed mild PAH with normal PCWP  and RA pressure, could be related to OSA and low oxygen saturation.   . Refusal of blood transfusions as patient is Jehovah's Witness   . Sinus bradycardia      Review of Systems  Constitutional: Positive for malaise/fatigue. Negative for chills, fever and weight loss.  HENT: Negative for congestion, nosebleeds, sinus pain and sore throat.   Eyes: Negative for photophobia, pain and discharge.  Respiratory: Positive for cough and shortness of breath. Negative for hemoptysis, sputum production and wheezing.   Cardiovascular: Positive for leg swelling. Negative for chest pain, palpitations and orthopnea.  Gastrointestinal: Negative for abdominal pain, constipation, diarrhea, nausea and vomiting.  Genitourinary: Negative for dysuria, frequency, hematuria and urgency.  Musculoskeletal: Negative for back pain, joint pain, myalgias and neck pain.  Skin: Negative for itching and rash.  Neurological: Negative for tingling, tremors, sensory change, speech change, focal weakness, seizures, weakness and headaches.  Psychiatric/Behavioral: Negative for memory loss, substance abuse and suicidal ideas. The patient is not nervous/anxious.       Objective:  Physical Exam   Vitals:   01/01/18 1137  BP: 104/60  Pulse: 67  SpO2: 95%  Weight: 194 lb (88 kg)  Height: _0  (1.575 m)   4L [place]  Gen: well appearing HENT: OP clear, TM's clear, neck supple PULM: Few crackles bases B, normal percussion CV:  RRR, no mgr, trace edema GI: BS+, soft, nontender Derm: no cyanosis or rash Psyche: normal mood and affect     CBC    Component Value Date/Time   WBC 9.9 12/15/2017 1400   RBC 2.96 (L) 12/15/2017 1400   HGB 9.3 (L) 12/15/2017 1400   HCT 30.3 (L) 12/15/2017 1400   PLT 236 12/15/2017 1400   MCV 102.4 (H) 12/15/2017 1400   MCH 31.4 12/15/2017 1400   MCHC 30.7 12/15/2017 1400   RDW 23.4 (H) 12/15/2017 1400   LYMPHSABS 1.5 11/29/2017 0550   MONOABS 0.9 11/29/2017 0550   EOSABS 0.1  11/29/2017 0550   BASOSABS 0.0 11/29/2017 0550     Chest imaging: - V/Q scan negative 3/22 June 2017 high-resolution CT scan of the chest images independently reviewed showing patchy groundglass throughout with air trapping but no clear evidence of underlying interstitial lung disease.  Some evidence of emphysema seen, motion degraded images  PFT: March 2018 ratio 83%, FVC 1.88 L 89% predicted, significant change with bronchodilator in the small airways, total lung capacity 3.63 L 76% predicted, DLCO 4.75 mL 22% predicted September 2019 pulmonary function testing: FVC 1.81 L 87% predicted, DLCO 7.5 mL 32% predicted   Labs: - HIV, SCL70 negative.  RF borderline elevated, doubt significant.  ANA negative.  - Lab work from rheumatology in 2019:  P-ANCA test was negative, myositis panel showed an elevated R00-52 lab and the CK was elevated at 1030, CCP elevated  Path:  Echo: Echo (3/18) with EF 65-70%, apical hypertrophic cardiomyopathy, severe LAE, PASP 86 mmHg.  Echo (3/19): EF 65-70%, apical hypertrophic cardiomyopathy, normal RV size and systolic function, severe LAE.  Echo 6/19: EF normal, BUBBLE STUDY NEGATIVE  Heart Catheterization: - RHC (3/18): mean RA 4, PA 69/23 mean 38, mean PCWP 8, CI 2.17, PVR 7.3 WU Fick, 9.6 WU thermo.  - RHC (3/19): mean RA 6, PA 49/16 mean 29, mean PCWP 10, CI 1.9/PVR 5.3 WU Fick, CI 1.4/PVR 7.3 Thermo.   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  We generally like to  treat this with immunosuppression such as prednisone and CellCept.  I usually choose CellCept because it is the best tolerated but in her case she said 2 episodes which concerned her.  First she noticed some joint aches with CellCept second, she had this GI bleed.  I explained to her today that I am in no way convinced that the CellCept because the GI bleed because it certainly did not cause her to have a diverticuli which was seen bleeding on a flexible sigmoidoscopy, but she is not interested in restarting the medicine.  Unfortunately were between a rock and a hard place because she has an elevated creatinine so this makes it harder to dose of Imuran appropriately.  Second line therapy could be rituximab but in the face of her recent anemia I am reluctant to do that right now.  I think we need to check another CT scan to see where things are  at and I am going to repeat the anti-myositis panel just to make sure we are clear that she has anti-synthetase syndrome.  If these tests show progression or ongoing positive serology then I think we need to refer her to [facility] for a second look at things.  Plan: Anti-synthetase syndrome with interstitial lung disease: We will check a CK level, aldolase level, and a myositis panel today (blood test to confirm this diagnosis) We will repeat a high-resolution CT scan of your chest Stay off CellCept for now Stay off prednisone for now  Pulmonary hypertension: Continue diuretic therapy as directed by the advanced heart failure clinic Continue pulmonary vasodilators as directed by the advanced heart failure clinic Weigh yourself daily Keep sodium intake less than 2 g a day  Respiratory failure with hypoxemia: Keep taking oxygen as you are doing  I am glad you have had a flu shot  We will call you back once we seen the results of the CT scan and the blood work, I will plan on seeing you back in 3 to 4 weeks or sooner if needed  > 50% of this 30 minute visit  spent face to face    Current Outpatient Medications:  .  acetaminophen (TYLENOL) 325 MG tablet, Take 2 tablets (650 mg total) by mouth every 4 (four) hours as needed for headache or mild pain., Disp: , Rfl:  .  apixaban (ELIQUIS) 5 MG TABS tablet, Take 1 tablet (5 mg total) by mouth 2 (two) times daily., Disp: 60 tablet, Rfl: 1 .  atorvastatin (LIPITOR) 80 MG tablet, Take 1 tablet (80 mg total) by mouth daily with lunch., Disp: 90 tablet, Rfl: 3 .  Cholecalciferol (VITAMIN D) 2000 UNITS CAPS, Take 2,000 Units by mouth daily. , Disp: , Rfl:  .  colchicine 0.6 MG tablet, Take 0.6 mg by mouth daily. , Disp: , Rfl:  .  dronedarone (MULTAQ) 400 MG tablet, Take 1 tablet (400 mg total) by mouth 2 (two) times daily with a meal., Disp: 60 tablet, Rfl: 0 .  ezetimibe (ZETIA) 10 MG tablet, Take 1 tablet (10 mg total) by mouth daily. Patient overdue for appt w/Dr Turner,must call and schedule for further refills3rd/final attempt, Disp: 15 tablet, Rfl: 0 .  ferrous sulfate 325 (65 FE) MG tablet, Take 1 tablet (325 mg total) by mouth 2 (two) times daily with a meal., Disp: 60 tablet, Rfl: 3 .  folic acid (FOLVITE) 1 MG tablet, Take 1 tablet (1 mg total) by mouth daily., Disp: 30 tablet, Rfl: 0 .  furosemide (LASIX) 20 MG tablet, Take 1 tablet (20 mg total) by mouth daily., Disp: 30 tablet, Rfl: 6 .  ipratropium-albuterol (DUONEB) 0.5-2.5 (3) MG/3ML SOLN, Take 3 mLs by nebulization 3 (three) times daily. DX: J44.9, Disp: 360 mL, Rfl: 5 .  levothyroxine (SYNTHROID, LEVOTHROID) 100 MCG tablet, Take 50 mcg by mouth daily before breakfast. , Disp: , Rfl:  .  Macitentan (OPSUMIT) 10 MG TABS, Take 1 tablet (10 mg total) by mouth daily., Disp: 30 tablet, Rfl: 11 .  Multiple Vitamin (MULTIVITAMIN) capsule, Take 1 capsule by mouth daily., Disp: , Rfl:  .  pantoprazole (PROTONIX) 40 MG tablet, Take 1 tablet (40 mg total) by mouth daily., Disp: 90 tablet, Rfl: 2 .  predniSONE (DELTASONE) 10 MG tablet, Take 1 tablet (10  mg total) by mouth 2 (two) times daily with a meal., Disp: 30 tablet, Rfl: 0 .  Selexipag (UPTRAVI) 1600 MCG TABS, Take 1,600 mcg by mouth 2 (  two) times daily. , Disp: , Rfl:  .  traMADol (ULTRAM) 50 MG tablet, Take 50 mg by mouth every 8 (eight) hours as needed for moderate pain., Disp: , Rfl:  .  UNABLE TO FIND, Med Name: CPAP  DME[facility], Disp: , Rfl:  .  vitamin B-12 100 MCG tablet, Take 1 tablet (100 mcg total) by mouth daily., Disp: 30 tablet, Rfl: 0

## 2018-01-01 NOTE — Progress Notes (Signed)
Patient completed PFT today. Pt refused to take the Xopenex treatment and post spiro today as she is going to Cardiology appt afterwards today. Therefore there is no post spiro on this PFT today.

## 2018-01-01 NOTE — Patient Instructions (Signed)
Increase Furosemide (Lasix) to 40 mg (2 tabs) in AM and 20 mg (1 tab) in PM for 2 DAYS ONLY, then take 40 mg (2 tabs) DAILY   Start Potassium 20 meq (1 tabs) daily  Labs in 1-2 weeks  Your physician recommends that you schedule a follow-up appointment in: 3-4 weeks with APP  Your physician recommends that you schedule a follow-up appointment in: 3-4 months with Dr Aundra Dubin

## 2018-01-01 NOTE — Patient Instructions (Signed)
Anti-synthetase syndrome with interstitial lung disease: We will check a CK level, aldolase level, and a myositis panel today (blood test to confirm this diagnosis) We will repeat a high-resolution CT scan of your chest Stay off CellCept for now Stay off prednisone for now  Pulmonary hypertension: Continue diuretic therapy as directed by the advanced heart failure clinic Continue pulmonary vasodilators as directed by the advanced heart failure clinic Weigh yourself daily Keep sodium intake less than 2 g a day  Respiratory failure with hypoxemia: Keep taking oxygen as you are doing  I am glad you have had a flu shot  We will call you back once we seen the results of the CT scan and the blood work, I will plan on seeing you back in 3 to 4 weeks or sooner if needed

## 2018-01-03 NOTE — Progress Notes (Signed)
PCP: Dr. Radene Ou Cardiology: Dr. Radford Pax HF Cardiology: Dr. Aundra Dubin Pulmonary: Dr. Lake Bells  72 y.o. with history of COPD, OSA on CPAP, apical hypertrophic cardiomyopathy, chronic diastolic CHF, and paroxysmal atrial fibrillation returns for followup of CHF and pulmonary hypertension.  Patient was admitted in 3/18 with CHF and dyspnea, found to have pulmonary arterial hypertension by RHC.  Prior to this, she has had a long history of PAF.  She is currently taking dronedarone but has had breakthrough fibrillation.  She is in NSR today.  She had an atrial fibrillation ablation in the past. She has apical hypertrophic cardiomyopathy proven by cardiac MRI with a consistent ECG.   She is on Opsumit and has started Selexipag, now titrated up to 1600 mcg bid. She was unable to tolerate Adcirca. Symptomatically, she has not seen significant improvement with selective pulmonary vasodilators to treat PAH.     I repeated her RHC in 3/19.  This showed lower PA pressure and normal right and left heart filling pressures, but more worrisomely, cardiac output was low.  However, on repeat of echo in 3/19, the RV actually looked normal in size and systolic function.  Given concern for possible PVOD as cause of her worsening symptoms on treatment, I ordered a high resolution CT chest.  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  I had her see Dr Lake Bells. He sent autoimmune serologies again, weak P-ANCA positivity so she was referred to rheumatology.  Echo bubble study was negative.  CCP and Ro-52 were positive.  Also of note, she has had atrial fibrillation ablation and pulmonary vein stenosis is a consideration.  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  She was started on prednisone and Cellcept.  Cellcept was stopped after recent GI  bleed.   In 9/19, she was admitted with a GI bleed.  Colonoscopy did not show a definite cause.  She was started back on Eliquis.   Patient is in NSR today.  No BRBPR/melena. Occasional lightheadedness but none recently.  She is using CPAP at night and 4L [place] during the day.  She is stably short of breath with short distances walking.  Weight is up 4 lbs.  No orthopnea/PND.     Labs (2/18): TSH normal Labs (3/18): K 3.8, creatinine 1.26, BNP 424 Labs (5/18): K 4.4, creatinine 1.96, anti-SCL 70 negative, RF very slightly elevated, HIV negative.  Labs (6/18): K 4, creatinine 1.2, LDL 49 Labs (9/18): BNP 149, K 4, creatinine 1.4, BNP 149 Labs (2/19): BNP 126, K 3.8, creatinine 1.5, CCP+, Ro-52+, elevated CK.  Labs (7/19): BNP 676, K 4.7, creatinine 1.74 Labs (9/19): K 3.8, creatinine 1.39, BNP 220  ECG (personally reviewed): NSR, LVH with repolarization abnormality.   4/18 6 minute walk: 109 m  10/18 6 minute walk: 229 m 2/19 6 minute walk: < 100 m 5/19 6 minute walk: 107 m  PMH:  1. COPD: Quit smoking 2011.  - PFTs (3/18): minimal obstruction, minimal restriction, DLCO 22% (severely decreased).  2. Hypothyroidism 3. Hyperlipidemia 4. OSA: Uses CPAP 5. Apical hypertrophic cardiomyopathy:  - Cardiac MRI (7/16) with EF 79%, mid-apical LV hypertrophy with spade-like ventricle, mid-myocardial LGE was noted at the apex.   6. GERD 7. Atrial fibrillation: Paroxysmal.  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 Long QT interval so dofetilide and sotalol have not been used. She has had atrial fibrillation ablation.  - Currently on  dronedarone.  - DCCV to NSR in ER 7/19.  8. Coronary artery disease: LHC (7/16) with nonobstructive disease + 85% ostial stenosis small D1.  9. Diastolic CHF: Echo (8/11) with EF 65-70%, apical hypertrophic cardiomyopathy, severe LAE, PASP 86 mmHg.  - RHC (3/18): mean RA 4, PA 69/23 mean 38, mean PCWP 8, CI 2.17, PVR 7.3 WU Fick, 9.6 WU thermo.  - Echo (3/19): EF 65-70%,  apical hypertrophic cardiomyopathy, normal RV size and systolic function, severe LAE.  - Echo (6/19): EF 65-70%, apical HCM, restrictive diastolic dysfunction, negative bubble study, PASP 42 mmHg.  10. Pulmonary hypertension: See RHC above.  - V/Q scan negative 3/18 - HIV, SCL70 negative.  RF borderline elevated, doubt significant.  ANA negative.  - Unable to tolerate Adcirca - RHC (3/19): mean RA 6, PA 49/16 mean 29, mean PCWP 10, CI 1.9/PVR 5.3 WU Fick, CI 1.4/PVR 7.3 Thermo.  - High resolution CT chest (3/19): respiratory motion artifact present, but suspect mild emphysema with lungs grossly clear.  - P-ANCA+, CCP+, RO-52+, CK elevated.  - Echo 6/19 with negative bubble study.  11. CPX (12/17): peak VO2 8.5, RER 0.96, VE/VCO2 slope 67. Submaximal but appears to show severe functional impairment.   12. CKD: Stage 3.  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   Social History   Socioeconomic History  . Marital status: Widowed    Spouse name: divorced.  . Number of children: 1  . Years of education: Not on file  . Highest education level: Not on file  Occupational History  . Occupation: retired. still works for [employer].   Social Needs  . Financial resource strain: Not hard at all  . Food insecurity:    Worry: Never true    Inability: Never true  . Transportation needs:    Medical: No    Non-medical: No  Tobacco Use  . Smoking status: Former Smoker    Packs/day: 0.50    Years: 44.00    Pack years: 22.00    Types: Cigarettes    Last attempt to quit: 04/07/2009    Years since quitting: 8.7  . Smokeless tobacco: Never Used  Substance and Sexual Activity  . Alcohol use: No  . Drug use: No  . Sexual activity: Never  Lifestyle  . Physical activity:    Days per week: Not on file    Minutes per session: Not on file  . Stress: Not on file  Relationships  . Social connections:    Talks on phone: Not  on file    Gets together: Not on file    Attends religious service: Not on file    Active member of club or organization: Not on file    Attends meetings of clubs or organizations: Not on file    Relationship status: Not on file  . Intimate partner violence:    Fear of current or ex partner: Not on file    Emotionally abused: Not on file    Physically abused: Not on file    Forced sexual activity: Not on file  Other Topics Concern  . Not on file  Social History Narrative   Pt lives alone with 2 dogs.    Family History  Problem Relation Age of Onset  . Heart disease Sister        Good Pastures Syndrome  . Gout Father   . Lung cancer Father        smoked  . Breast  cancer Paternal Aunt    Review of systems complete and found to be negative unless listed in HPI.    Current Outpatient Medications  Medication Sig Dispense Refill  . acetaminophen (TYLENOL) 325 MG tablet Take 2 tablets (650 mg total) by mouth every 4 (four) hours as needed for headache or mild pain.    Marland Kitchen apixaban (ELIQUIS) 5 MG TABS tablet Take 1 tablet (5 mg total) by mouth 2 (two) times daily. 60 tablet 1  . atorvastatin (LIPITOR) 80 MG tablet Take 1 tablet (80 mg total) by mouth daily with lunch. 90 tablet 3  . Cholecalciferol (VITAMIN D) 2000 UNITS CAPS Take 2,000 Units by mouth daily.     . colchicine 0.6 MG tablet Take 0.6 mg by mouth daily.     Marland Kitchen dronedarone (MULTAQ) 400 MG tablet Take 1 tablet (400 mg total) by mouth 2 (two) times daily with a meal. 60 tablet 0  . ezetimibe (ZETIA) 10 MG tablet Take 1 tablet (10 mg total) by mouth daily. Patient overdue for appt w/Dr Turner,must call and schedule for further refills3rd/final attempt 15 tablet 0  . ferrous sulfate 325 (65 FE) MG tablet Take 1 tablet (325 mg total) by mouth 2 (two) times daily with a meal. 60 tablet 3  . folic acid (FOLVITE) 1 MG tablet Take 1 tablet (1 mg total) by mouth daily. 30 tablet 0  . furosemide (LASIX) 20 MG tablet Take 2 tablets (40 mg  total) by mouth daily. 60 tablet 6  . ipratropium-albuterol (DUONEB) 0.5-2.5 (3) MG/3ML SOLN Take 3 mLs by nebulization 3 (three) times daily. DX: J44.9 360 mL 5  . levothyroxine (SYNTHROID, LEVOTHROID) 100 MCG tablet Take 50 mcg by mouth daily before breakfast.     . Macitentan (OPSUMIT) 10 MG TABS Take 1 tablet (10 mg total) by mouth daily. 30 tablet 11  . Multiple Vitamin (MULTIVITAMIN) capsule Take 1 capsule by mouth daily.    . pantoprazole (PROTONIX) 40 MG tablet Take 1 tablet (40 mg total) by mouth daily. 90 tablet 2  . Selexipag (UPTRAVI) 1600 MCG TABS Take 1,600 mcg by mouth 2 (two) times daily.     . traMADol (ULTRAM) 50 MG tablet Take 50 mg by mouth every 8 (eight) hours as needed for moderate pain.    Marland Kitchen UNABLE TO FIND Med Name: CPAP  [facility]    . vitamin B-12 100 MCG tablet Take 1 tablet (100 mcg total) by mouth daily. 30 tablet 0  . potassium chloride SA (K-DUR,KLOR-CON) 20 MEQ tablet Take 1 tablet (20 mEq total) by mouth daily. 30 tablet 6   No current facility-administered medications for this encounter.    BP 112/70   Pulse 64   Wt 88.2 kg (194 lb 6.4 oz)   SpO2 90%   BMI 35.56 kg/m   General: NAD Neck: JVP 9-10 cm, no thyromegaly or thyroid nodule.  Lungs: Clear to auscultation bilaterally with normal respiratory effort. CV: Nondisplaced PMI.  Heart regular S1/S2, no S3/S4, no murmur.  1+ edema 1/2 to knees bilaterally.  No carotid bruit.  Normal pedal pulses.  Abdomen: Soft, nontender, no hepatosplenomegaly, no distention.  Skin: Intact without lesions or rashes.  Neurologic: Alert and oriented x 3.  Psych: Normal affect. Extremities: No clubbing or cyanosis.  HEENT: Normal.   Assessment/Plan: 1. Chronic diastolic CHF: Echo 3/21 with EF 65-70%, normal RV size and systolic function.  Given worsening symptoms and the finding of low cardiac output on RHC, I had expected her RV  to look hypokinetic. However, on 6/19 echo, the RV looked fairly normal. Today on exam, she  appears volume overloaded.  Chronic NYHA class III symptoms.  Weight is up. - Increase Lasix to 40 qam/20 qpm x 2 days then 40 mg daily after that.  Add KCl 20 daily.  - BMET in 10 days.  2. Apical hypertrophic cardiomyopathy: Cardiac MRI in 7/16 showed mid-apical LV hypertrophy with vigorous contraction. Now off metoprolol with slow HR (uses metoprolol prn tachycardia - atrial fibrillation).  ECG is consistent with apical HCM.   3. Atrial fibrillation:  Remains in NSR on Multaq.  Per Dr Rayann Heman, would not re-do her ablation. Breakthrough atrial fibrillation in 7/19, DCCV back to NSR in ER. NSR today.   - Continue Eliquis.  No further GI bleeding.     - Continue dronedarone.  She does have CHF/volume overload but will continue dronedarone for now unless CHF becomes difficult to manage.  4. Pulmonary hypertension: RHC in 3/18 showed moderate pulmonary arterial hypertension with elevated PVR (7.3 WU by Fick, 9.6 WU by thermo). She has COPD but I do not think this plays a large role in the pulmonary hypertension =>  PFTs in 3/18 showed minimal obstruction and restriction, there was significantly decreased DLCO, this may be due to pulmonary vascular disease.  OSA may play a role but not a large one. V/Q scan did not suggest chronic PEs in 3/18.  RF borderline elevated (probably not significant), HIV negative, Anti-SCL70 negative, ANA negative, P-ANCA weakly positive.  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  Her RHC in 3/19 showed lower PA pressure and PVR with normal right and left heart filling pressures, but cardiac output was lower than prior.  Surprisingly, her RV looked normal on echo so I question how accurate the cardiac output measurement really was.  Bubble study negative on echo.  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  She has  been seen by pulmonary and rheumatology: no definitive evidence for PVOD;  It is suspected that she has anti-synthetase syndrome.  - Also of note, she has a history of afib ablation so pulmonary vein stenosis certainly remains a concern for raising PA pressure but think this is unlikely as the cause for her presentation.   - Continue Opsumit.     - She did not tolerate Adcirca.   - Continue Selexipag 1600 bid.  5. Anti-synthetase syndrome: Elevated CK, positive myositis panel, nonspecific interstitial pneumonia.  - She has now stopped Cellcept since episode of GI bleeding.  - She will stop prednisone today.  - Dr. Lake Bells has arranged a repeat high resolution CT chest.   [name], MD  01/03/2018

## 2018-01-04 DIAGNOSIS — F0631 Mood disorder due to known physiological condition with depressive features: Secondary | ICD-10-CM | POA: Diagnosis not present

## 2018-01-04 LAB — ALDOLASE: ALDOLASE: 8.7 U/L — AB (ref <=8.1)

## 2018-01-05 ENCOUNTER — Other Ambulatory Visit: Payer: Self-pay | Admitting: Pulmonary Disease

## 2018-01-05 DIAGNOSIS — I2510 Atherosclerotic heart disease of native coronary artery without angina pectoris: Secondary | ICD-10-CM | POA: Diagnosis not present

## 2018-01-05 DIAGNOSIS — J9611 Chronic respiratory failure with hypoxia: Secondary | ICD-10-CM | POA: Diagnosis not present

## 2018-01-05 DIAGNOSIS — I272 Pulmonary hypertension, unspecified: Secondary | ICD-10-CM | POA: Diagnosis not present

## 2018-01-05 DIAGNOSIS — J849 Interstitial pulmonary disease, unspecified: Secondary | ICD-10-CM

## 2018-01-05 DIAGNOSIS — I130 Hypertensive heart and chronic kidney disease with heart failure and stage 1 through stage 4 chronic kidney disease, or unspecified chronic kidney disease: Secondary | ICD-10-CM | POA: Diagnosis not present

## 2018-01-05 DIAGNOSIS — K5791 Diverticulosis of intestine, part unspecified, without perforation or abscess with bleeding: Secondary | ICD-10-CM | POA: Diagnosis not present

## 2018-01-05 DIAGNOSIS — D8989 Other specified disorders involving the immune mechanism, not elsewhere classified: Secondary | ICD-10-CM | POA: Diagnosis not present

## 2018-01-06 ENCOUNTER — Other Ambulatory Visit: Payer: Self-pay

## 2018-01-06 DIAGNOSIS — I130 Hypertensive heart and chronic kidney disease with heart failure and stage 1 through stage 4 chronic kidney disease, or unspecified chronic kidney disease: Secondary | ICD-10-CM | POA: Diagnosis not present

## 2018-01-06 DIAGNOSIS — J9611 Chronic respiratory failure with hypoxia: Secondary | ICD-10-CM | POA: Diagnosis not present

## 2018-01-06 DIAGNOSIS — K5791 Diverticulosis of intestine, part unspecified, without perforation or abscess with bleeding: Secondary | ICD-10-CM | POA: Diagnosis not present

## 2018-01-06 DIAGNOSIS — D8989 Other specified disorders involving the immune mechanism, not elsewhere classified: Secondary | ICD-10-CM | POA: Diagnosis not present

## 2018-01-06 DIAGNOSIS — I272 Pulmonary hypertension, unspecified: Secondary | ICD-10-CM | POA: Diagnosis not present

## 2018-01-06 DIAGNOSIS — I2510 Atherosclerotic heart disease of native coronary artery without angina pectoris: Secondary | ICD-10-CM | POA: Diagnosis not present

## 2018-01-06 NOTE — Patient Outreach (Signed)
[facility] [facility]) Care Management  01/06/2018  Fern Canova [name] 1945-09-24 [id number]  Successful outreach to the patient on today's date, HIPAA identifiers confirmed. The patient is able to confirm she has been in touch with Roselind Messier, Nutrition Director with [employer]. The patient has completed an application and mailed the deposit to begin receiving meals.  BSW discussed a discipline closure with the patient considering no other social work needs have been identified. The patient is aware she will remain active with [place] and pharmacist.   Prior to ending today's call the patient expressed concern with not seeing her home health nurse or aide from [place]. The patient gave BSW permission to contact [facility]. BSW spoke with Melissa who confirmed the nurse has been visiting the patient. Melissa also stated the home health aide order was just received and the patient will begin these services now.  BSW will update other members of the patients care team with [place] Management of the patients disposition.  Daneen Schick, BSW, CDP [facility] [phone]

## 2018-01-07 ENCOUNTER — Other Ambulatory Visit: Payer: Self-pay | Admitting: Pharmacist

## 2018-01-07 ENCOUNTER — Ambulatory Visit: Payer: Self-pay | Admitting: Pharmacist

## 2018-01-07 NOTE — Patient Outreach (Addendum)
[place] [facility]) Care Management  01/07/2018  Amy [name] 1946/02/09 [id number]   Patient was called for follow-up. HIPAA identifiers were obtained.  Patient said she was feeling much better. She reported that she was recently seen by her primary care provider who gave her Valtrex for a shingles outbreak and increased her Levothyroxine from 44mg to 741m daily. Unfortuantely, patient said she did not start the valcyclovir (Valtrex) because she read the side effects and has elected to let the shingles outbreak run its course. (Even after discussing the benefits of valcyclovir, patient was unwilling to begin therapy.)  Patient wondered if Eliquis could cause liver damage because she had been experiencing some right sided abdominal pain right up under rib cage. Patient described the pain as a 4/10 and much better today than yesterday.   The side effects of Eliquis were discussed.  Patient also said her shingles rash was on her right side.  She has an appointment with her PCP office on Monday 01/11/18 for evaluation and lab work.  Patient's medications were reviewed via telephone. She uses pill boxes to organize her medications with the help of her daughter and does not have issues with medication affordability.  Medications Reviewed Today    Reviewed by HeVanice SarahCMA (Certified Medical Assistant) on 01/01/18 at 1347  Med List Status: <None>  Medication Order Taking? Sig Documenting Provider Last Dose Status Informant  acetaminophen (TYLENOL) 325 MG tablet [id number] Take 2 tablets (650 mg total) by mouth every 4 (four) hours as needed for headache or mild pain. KiErlene QuanPA-C Taking Active Self  apixaban (ELIQUIS) 5 MG TABS tablet [id number] Take 1 tablet (5 mg total) by mouth 2 (two) times daily. Regalado, Belkys A, MD Taking Active   atorvastatin (LIPITOR) 80 MG tablet [id number] Take 1 tablet (80 mg total) by mouth daily with lunch. CaSherran Needs NP Taking Active Self  Cholecalciferol (VITAMIN D) 2000 UNITS CAPS [id number] Take 2,000 Units by mouth daily.  [provider] Taking Active Self  colchicine 0.6 MG tablet [id number] Take 0.6 mg by mouth daily.  [provider] Taking Active Self  dronedarone (MULTAQ) 400 MG tablet [id number] Take 1 tablet (400 mg total) by mouth 2 (two) times daily with a meal. Regalado, Belkys A, MD Taking Active   ezetimibe (ZETIA) 10 MG tablet [id number] 1 tablet (10 mg total) by mouth daily. Patient overdue for appt w/Dr Turner,must call and schedule for further refills3rd/final attempt Turner, TrEber HongMD  Active   ferrous sulfate 325 (65 FE) MG tablet [id number] 1 tablet (325 mg total) by mouth 2 (two) times daily with a meal. Regalado, Belkys A, MD  Active   folic acid (FOLVITE) 1 MG tablet [id number] 1 tablet (1 mg total) by mouth daily. Regalado, Belkys A, MD  Active   furosemide (LASIX) 20 MG tablet [id number] 1 tablet (20 mg total) by mouth daily. McLarey DresserMD  Active Self  ipratropium-albuterol (DUONEB) 0.5-2.5 (3) MG/3ML SOLN [id number] 3 mLs by nebulization 3 (three) times daily. DX: J44.9 McJuanito DoomMD  Active Self  levothyroxine (SYNTHROID, LEVOTHROID) 100 MCG tablet [id number] Take 50 mcg by mouth daily before breakfast.  [provider] Taking Active Self  Macitentan (OPSUMIT) 10 MG TABS [id number] 1 tablet (10 mg total) by mouth daily. McLarey DresserMD  Active Self  Multiple Vitamin (MULTIVITAMIN)  capsule [id number]  Take 1 capsule by mouth daily. [provider]  Active Self  pantoprazole (PROTONIX) 40 MG tablet [id number]  Take 1 tablet (40 mg total) by mouth daily. Larey Dresser, MD  Active Self       Patient not taking:       Discontinued 01/01/18 1346 (Completed Course)   Selexipag (UPTRAVI) 1600 MCG TABS [id number]  Take 1,600 mcg by mouth 2 (two) times daily.  [provider]  Active  Self  traMADol (ULTRAM) 50 MG tablet [id number]  Take 50 mg by mouth every 8 (eight) hours as needed for moderate pain. [provider]  Active Self           Med Note Tamala Julian, JEFFREY W   Sun Nov 29, 2017  5:59 AM)    Karen Kays TO FIND [id number]  Med Name: CPAP  [facility] [provider]  Active Self  vitamin B-12 100 MCG tablet [id number]  Take 1 tablet (100 mcg total) by mouth daily. Elmarie Shiley, MD  Active          Today's Vitals   01/07/18 2473  PainSc: 4     Plan: Close patient's pharmacy case. Patient communicated understanding that she can reach out to me in the future with medication related questions or concerns.  Route note to PCP   Send inbasket message to [facility] Nurse, [name] RN.  Elayne [name], PharmD, [place] Clinical Pharmacist [phone]

## 2018-01-08 DIAGNOSIS — I130 Hypertensive heart and chronic kidney disease with heart failure and stage 1 through stage 4 chronic kidney disease, or unspecified chronic kidney disease: Secondary | ICD-10-CM | POA: Diagnosis not present

## 2018-01-08 DIAGNOSIS — K5791 Diverticulosis of intestine, part unspecified, without perforation or abscess with bleeding: Secondary | ICD-10-CM | POA: Diagnosis not present

## 2018-01-08 DIAGNOSIS — I2510 Atherosclerotic heart disease of native coronary artery without angina pectoris: Secondary | ICD-10-CM | POA: Diagnosis not present

## 2018-01-08 DIAGNOSIS — D8989 Other specified disorders involving the immune mechanism, not elsewhere classified: Secondary | ICD-10-CM | POA: Diagnosis not present

## 2018-01-08 DIAGNOSIS — J9611 Chronic respiratory failure with hypoxia: Secondary | ICD-10-CM | POA: Diagnosis not present

## 2018-01-08 DIAGNOSIS — I272 Pulmonary hypertension, unspecified: Secondary | ICD-10-CM | POA: Diagnosis not present

## 2018-01-08 LAB — MYOSITIS PANEL III: RNP: 3.4 U/mL

## 2018-01-11 ENCOUNTER — Telehealth: Payer: Self-pay | Admitting: Pulmonary Disease

## 2018-01-11 NOTE — Telephone Encounter (Signed)
Spoke with pt. She is wanting to know if she can have other doctors (all [employer]) blood work done when she comes in to do the blood work for our office. Advised her that she should be able to have it all done at one time. Nothing further was needed.

## 2018-01-12 DIAGNOSIS — I130 Hypertensive heart and chronic kidney disease with heart failure and stage 1 through stage 4 chronic kidney disease, or unspecified chronic kidney disease: Secondary | ICD-10-CM | POA: Diagnosis not present

## 2018-01-12 DIAGNOSIS — I2510 Atherosclerotic heart disease of native coronary artery without angina pectoris: Secondary | ICD-10-CM | POA: Diagnosis not present

## 2018-01-12 DIAGNOSIS — J9611 Chronic respiratory failure with hypoxia: Secondary | ICD-10-CM | POA: Diagnosis not present

## 2018-01-12 DIAGNOSIS — I272 Pulmonary hypertension, unspecified: Secondary | ICD-10-CM | POA: Diagnosis not present

## 2018-01-12 DIAGNOSIS — D8989 Other specified disorders involving the immune mechanism, not elsewhere classified: Secondary | ICD-10-CM | POA: Diagnosis not present

## 2018-01-12 DIAGNOSIS — K5791 Diverticulosis of intestine, part unspecified, without perforation or abscess with bleeding: Secondary | ICD-10-CM | POA: Diagnosis not present

## 2018-01-13 ENCOUNTER — Ambulatory Visit (HOSPITAL_COMMUNITY)
Admission: RE | Admit: 2018-01-13 | Discharge: 2018-01-13 | Disposition: A | Discharge status: Home / Self Care | Payer: Medicare Other | Source: Ambulatory Visit | Attending: Internal Medicine | Admitting: Internal Medicine

## 2018-01-13 DIAGNOSIS — J849 Interstitial pulmonary disease, unspecified: Secondary | ICD-10-CM | POA: Diagnosis not present

## 2018-01-13 DIAGNOSIS — F0631 Mood disorder due to known physiological condition with depressive features: Secondary | ICD-10-CM | POA: Diagnosis not present

## 2018-01-13 DIAGNOSIS — I5032 Chronic diastolic (congestive) heart failure: Secondary | ICD-10-CM | POA: Diagnosis not present

## 2018-01-13 DIAGNOSIS — D500 Iron deficiency anemia secondary to blood loss (chronic): Secondary | ICD-10-CM | POA: Diagnosis not present

## 2018-01-13 LAB — BASIC METABOLIC PANEL
Anion gap: 8 (ref 5–15)
BUN: 11 mg/dL (ref 8–23)
CALCIUM: 9 mg/dL (ref 8.9–10.3)
CO2: 22 mmol/L (ref 22–32)
CREATININE: 1.25 mg/dL — AB (ref 0.44–1.00)
Chloride: 108 mmol/L (ref 98–111)
GFR calc Af Amer: 49 mL/min — ABNORMAL LOW (ref >60)
GFR, EST NON AFRICAN AMERICAN: 42 mL/min — AB (ref >60)
Glucose, Bld: 102 mg/dL — ABNORMAL HIGH (ref 70–99)
Potassium: 3.7 mmol/L (ref 3.5–5.1)
SODIUM: 138 mmol/L (ref 135–145)

## 2018-01-13 LAB — HEPATIC FUNCTION PANEL
ALK PHOS: 64 U/L (ref 38–126)
ALT: 58 U/L — AB (ref 0–44)
AST: 48 U/L — ABNORMAL HIGH (ref 15–41)
Albumin: 3.4 g/dL — ABNORMAL LOW (ref 3.5–5.0)
BILIRUBIN TOTAL: 0.6 mg/dL (ref 0.3–1.2)
Total Protein: 6.1 g/dL — ABNORMAL LOW (ref 6.5–8.1)

## 2018-01-14 ENCOUNTER — Telehealth (HOSPITAL_COMMUNITY): Payer: Self-pay

## 2018-01-14 DIAGNOSIS — J9611 Chronic respiratory failure with hypoxia: Secondary | ICD-10-CM | POA: Diagnosis not present

## 2018-01-14 DIAGNOSIS — I2510 Atherosclerotic heart disease of native coronary artery without angina pectoris: Secondary | ICD-10-CM | POA: Diagnosis not present

## 2018-01-14 DIAGNOSIS — D8989 Other specified disorders involving the immune mechanism, not elsewhere classified: Secondary | ICD-10-CM | POA: Diagnosis not present

## 2018-01-14 DIAGNOSIS — I130 Hypertensive heart and chronic kidney disease with heart failure and stage 1 through stage 4 chronic kidney disease, or unspecified chronic kidney disease: Secondary | ICD-10-CM | POA: Diagnosis not present

## 2018-01-14 DIAGNOSIS — K5791 Diverticulosis of intestine, part unspecified, without perforation or abscess with bleeding: Secondary | ICD-10-CM | POA: Diagnosis not present

## 2018-01-14 DIAGNOSIS — I272 Pulmonary hypertension, unspecified: Secondary | ICD-10-CM | POA: Diagnosis not present

## 2018-01-14 NOTE — Telephone Encounter (Signed)
Pt called with lab results has appointment on 10/25 will have repeated Hepatic function panel done at appointment

## 2018-01-15 ENCOUNTER — Ambulatory Visit (INDEPENDENT_AMBULATORY_CARE_PROVIDER_SITE_OTHER)
Admission: RE | Admit: 2018-01-15 | Discharge: 2018-01-15 | Disposition: A | Discharge status: Home / Self Care | Payer: Medicare Other | Source: Ambulatory Visit | Attending: Pulmonary Disease | Admitting: Pulmonary Disease

## 2018-01-15 DIAGNOSIS — J849 Interstitial pulmonary disease, unspecified: Secondary | ICD-10-CM

## 2018-01-15 DIAGNOSIS — J439 Emphysema, unspecified: Secondary | ICD-10-CM | POA: Diagnosis not present

## 2018-01-18 DIAGNOSIS — I2510 Atherosclerotic heart disease of native coronary artery without angina pectoris: Secondary | ICD-10-CM | POA: Diagnosis not present

## 2018-01-18 DIAGNOSIS — D8989 Other specified disorders involving the immune mechanism, not elsewhere classified: Secondary | ICD-10-CM | POA: Diagnosis not present

## 2018-01-18 DIAGNOSIS — J9611 Chronic respiratory failure with hypoxia: Secondary | ICD-10-CM | POA: Diagnosis not present

## 2018-01-18 DIAGNOSIS — I272 Pulmonary hypertension, unspecified: Secondary | ICD-10-CM | POA: Diagnosis not present

## 2018-01-18 DIAGNOSIS — K5791 Diverticulosis of intestine, part unspecified, without perforation or abscess with bleeding: Secondary | ICD-10-CM | POA: Diagnosis not present

## 2018-01-18 DIAGNOSIS — I130 Hypertensive heart and chronic kidney disease with heart failure and stage 1 through stage 4 chronic kidney disease, or unspecified chronic kidney disease: Secondary | ICD-10-CM | POA: Diagnosis not present

## 2018-01-19 ENCOUNTER — Other Ambulatory Visit: Payer: Self-pay | Admitting: *Deleted

## 2018-01-19 ENCOUNTER — Ambulatory Visit: Payer: Medicare Other | Admitting: Internal Medicine

## 2018-01-19 DIAGNOSIS — J9611 Chronic respiratory failure with hypoxia: Secondary | ICD-10-CM | POA: Diagnosis not present

## 2018-01-19 DIAGNOSIS — I130 Hypertensive heart and chronic kidney disease with heart failure and stage 1 through stage 4 chronic kidney disease, or unspecified chronic kidney disease: Secondary | ICD-10-CM | POA: Diagnosis not present

## 2018-01-19 DIAGNOSIS — K5791 Diverticulosis of intestine, part unspecified, without perforation or abscess with bleeding: Secondary | ICD-10-CM | POA: Diagnosis not present

## 2018-01-19 DIAGNOSIS — D8989 Other specified disorders involving the immune mechanism, not elsewhere classified: Secondary | ICD-10-CM | POA: Diagnosis not present

## 2018-01-19 DIAGNOSIS — I272 Pulmonary hypertension, unspecified: Secondary | ICD-10-CM | POA: Diagnosis not present

## 2018-01-19 DIAGNOSIS — I2510 Atherosclerotic heart disease of native coronary artery without angina pectoris: Secondary | ICD-10-CM | POA: Diagnosis not present

## 2018-01-19 NOTE — Patient Outreach (Addendum)
[facility]) Care Management   01/19/2018  Amy [name] 05-10-1945 [id number]  Amy [name] is an 72 y.o. female  Subjective:  SAFETY: Pt reports no falls or injuries and pt continue to report using her DME both inside and outside the home MEDICATIONS: Pt reports no needed refills and that she has enough supplies. Pt aware of the few changes to her medications and understanding why. Pt taking all medications accordingly to her providers recommendations. APPOINTMENTS: Pt reports she has sufficient transportation to all her medication appointments with no missed appointments. No needs at this time for other resources.  RESOURCES: Pt reports receiving the referral for social worker with [facility] and now has active services with [employer] for delivery of meal services. No additional needs at this time. Pt also reports services with [facility] for PT and RN twice weekly and nurse's aide to assist her with bathing. Pt also indicates trouble swallowing and her provider will be adding Speech therapy to the list of services. Pt also continue to reprot she has hired help when needed and her has moved in to further assist her with her needs. EDEMA: Pt reports no additional swelling with the increase on her Lasix to her LE but remains aware to elevate if needed. Pt has a very close relationship with her provider and able contact the office with any needs.    Objective:   Review of Systems  All other systems reviewed and are negative.   Physical Exam  Constitutional: She is oriented to person, place, and time. She appears well-developed and well-nourished.  HENT:  Right Ear: External ear normal.  Left Ear: External ear normal.  Eyes: EOM are normal.  Neck: Normal range of motion.  Cardiovascular: Normal heart sounds.  Respiratory: Effort normal and breath sounds normal.  GI: Soft. Bowel sounds are normal.  Musculoskeletal: Normal range of motion.  Neurological: She  is alert and oriented to person, place, and time.  Skin: Skin is warm and dry.  Psychiatric: She has a normal mood and affect. Her behavior is normal. Judgment and thought content normal.    Encounter Medications:   Outpatient Encounter Medications as of 01/19/2018  Medication Sig  . acetaminophen (TYLENOL) 325 MG tablet Take 2 tablets (650 mg total) by mouth every 4 (four) hours as needed for headache or mild pain.  Marland Kitchen apixaban (ELIQUIS) 5 MG TABS tablet Take 1 tablet (5 mg total) by mouth 2 (two) times daily.  Marland Kitchen atorvastatin (LIPITOR) 80 MG tablet Take 1 tablet (80 mg total) by mouth daily with lunch.  . Cholecalciferol (VITAMIN D) 2000 UNITS CAPS Take 2,000 Units by mouth daily.   . colchicine 0.6 MG tablet Take 0.6 mg by mouth daily.   Marland Kitchen dronedarone (MULTAQ) 400 MG tablet Take 1 tablet (400 mg total) by mouth 2 (two) times daily with a meal.  . ezetimibe (ZETIA) 10 MG tablet Take 1 tablet (10 mg total) by mouth daily. Patient overdue for appt w/Dr Turner,must call and schedule for further refills3rd/final attempt  . ferrous sulfate 325 (65 FE) MG tablet Take 1 tablet (325 mg total) by mouth 2 (two) times daily with a meal.  . folic acid (FOLVITE) 1 MG tablet Take 1 tablet (1 mg total) by mouth daily.  . furosemide (LASIX) 20 MG tablet Take 2 tablets (40 mg total) by mouth daily.  Marland Kitchen ipratropium-albuterol (DUONEB) 0.5-2.5 (3) MG/3ML SOLN Take 3 mLs by nebulization 3 (three) times daily. DX: J44.9  . levothyroxine (  SYNTHROID, LEVOTHROID) 75 MCG tablet Take 75 mcg by mouth daily before breakfast.   . Macitentan (OPSUMIT) 10 MG TABS Take 1 tablet (10 mg total) by mouth daily.  . Multiple Vitamin (MULTIVITAMIN) capsule Take 1 capsule by mouth daily.  . pantoprazole (PROTONIX) 40 MG tablet Take 1 tablet (40 mg total) by mouth daily.  . potassium chloride SA (K-DUR,KLOR-CON) 20 MEQ tablet Take 1 tablet (20 mEq total) by mouth daily. (Patient taking differently: Take 20 mEq by mouth daily. Pt taking  differently 1/2 tab of 20 mEq daily)  . Selexipag (UPTRAVI) 1600 MCG TABS Take 1,600 mcg by mouth 2 (two) times daily.   . traMADol (ULTRAM) 50 MG tablet Take 50 mg by mouth every 8 (eight) hours as needed for moderate pain.  Marland Kitchen UNABLE TO FIND Med Name: CPAP  DME[facility]  . vitamin B-12 100 MCG tablet Take 1 tablet (100 mcg total) by mouth daily.   No facility-administered encounter medications on file as of 01/19/2018.     Functional Status:   In your present state of health, do you have any difficulty performing the following activities: 12/21/2017 11/29/2017  Hearing? N N  Vision? N N  Difficulty concentrating or making decisions? N N  Walking or climbing stairs? Y N  Dressing or bathing? N N  Doing errands, shopping? Y N  Preparing Food and eating ? Y -  Using the Toilet? N -  In the past six months, have you accidently leaked urine? N -  Do you have problems with loss of bowel control? N -  Managing your Medications? N -  Managing your Finances? N -  Housekeeping or managing your Housekeeping? Y -  Some recent data might be hidden    Fall/Depression Screening:    Fall Risk  12/21/2017 12/17/2017 11/07/2016  Falls in the past year? No Yes No  Injury with Fall? - No -  Risk for fall due to : - History of fall(s) -   PHQ 2/9 Scores 12/21/2017 12/17/2017 09/16/2017 05/07/2017 11/07/2016 10/17/2016 10/13/2016  PHQ - 2 Score 0 0 _0 PHQ- 9 Score - - - 10 - - 4   BP 92/62 (BP Location: Right Arm, Patient Position: Sitting, Cuff Size: Normal)   Pulse 61   Resp 20   Ht 1.575 m (5' 2")   Wt 187 lb (84.8 kg)   SpO2 97%   BMI 34.20 kg/m  Assessment:   Follow up on safety measures related to weakness Follow up medication adherence Follow up on attendance to all medical appointments  Follow up on CSW via [facility] for community resources and [facility] involvement Follow up on bilateral edema to LE  Plan:  Will review plan of care and verify pt has met all goals with her ongoing self  management of care. Will verify no falls or injuries and pt continue to use her DME both inside and outside the home with no  reported incidents. No new goals as RN verified pt comfort level in continue her self management of care. Will verify adherence with all reviewed medications with no needed refills and enough supplies. Will verify pt's continue to attend all medical appointments with no delays or missed appointments. Will verify pt has sufficient transportation to her appointments. Will verify pt received the needed assistance for [facility] is active and delivers daily meals to pt with no reported issues. Will also verify [facility] services with no delays or related issues. Will continue to  encouraged participation however awareness of her limits with any related symptoms of distress.  Will verify pt continue to elevate her LE to reduce ongoing swelling. Note no swelling to LE noted on today's assessment. Based upon the above information pt is managing her care well with no additional needs and comfortable with graduating from [facility] services.  Will notify pt's primary provider with case closed and provider contact number for [facility] if services are needed in the future.   THN CM Care Plan Problem One     Most Recent Value  Care Plan Problem One  Weakness related to recent hospital discharge  Role Documenting the Problem One  Care Management [place] for Problem One  Not Active  THN Long Term Goal   Pt will use her DME to avoid risk of falls/injuries over the next 90 days.  THN Long Term Goal Start Date  12/22/17  THN Long Term Goal Met Date  01/12/18  THN CM Short Term Goal #1   Pt will take all discharge medications as prescribed over the next 30 days.  THN CM Short Term Goal #1 Start Date  12/22/17  THN CM Short Term Goal #1 Met Date  01/19/18  THN CM Short Term Goal #2   Pt will attend all medical appointments as scheduled over the next 30 days.  THN CM Short Term Goal #2  Start Date  12/22/17  THN CM Short Term Goal #2 Met Date  01/19/18    [facility] CM Care Plan Problem Two     Most Recent Value  Care Plan Problem Two  Bilateral edema  Role Documenting the Problem Two  Care Management Coordinator  Care Plan for Problem Two  Not Active  THN CM Short Term Goal #1   Pt will elevate her bilateral legs to reduce ongoing swelling over the next 30 days.   THN CM Short Term Goal #1 Start Date  12/22/17  THN CM Short Term Goal #1 Met Date   01/19/18    [facility] CM Care Plan Problem Three     Most Recent Value  Care Plan Problem Three  Community resources requested for food services  Role Documenting the Problem Three  Care Management Coordinator  Care Plan for Problem Three  Not Active  THN CM Short Term Goal #1   Pt will be receptive to the social worker referral for food resources within the next 3 weeks.  THN CM Short Term Goal #1 Start Date  12/22/17  [facility] CM Short Term Goal #1 Met Date  01/19/18      [name], RN Care Management Coordinator [place] Office [phone]

## 2018-01-20 DIAGNOSIS — F0631 Mood disorder due to known physiological condition with depressive features: Secondary | ICD-10-CM | POA: Diagnosis not present

## 2018-01-20 DIAGNOSIS — I2510 Atherosclerotic heart disease of native coronary artery without angina pectoris: Secondary | ICD-10-CM | POA: Diagnosis not present

## 2018-01-20 DIAGNOSIS — J9611 Chronic respiratory failure with hypoxia: Secondary | ICD-10-CM | POA: Diagnosis not present

## 2018-01-20 DIAGNOSIS — D8989 Other specified disorders involving the immune mechanism, not elsewhere classified: Secondary | ICD-10-CM | POA: Diagnosis not present

## 2018-01-20 DIAGNOSIS — I272 Pulmonary hypertension, unspecified: Secondary | ICD-10-CM | POA: Diagnosis not present

## 2018-01-20 DIAGNOSIS — I130 Hypertensive heart and chronic kidney disease with heart failure and stage 1 through stage 4 chronic kidney disease, or unspecified chronic kidney disease: Secondary | ICD-10-CM | POA: Diagnosis not present

## 2018-01-20 DIAGNOSIS — K5791 Diverticulosis of intestine, part unspecified, without perforation or abscess with bleeding: Secondary | ICD-10-CM | POA: Diagnosis not present

## 2018-01-21 DIAGNOSIS — I272 Pulmonary hypertension, unspecified: Secondary | ICD-10-CM | POA: Diagnosis not present

## 2018-01-21 DIAGNOSIS — I2510 Atherosclerotic heart disease of native coronary artery without angina pectoris: Secondary | ICD-10-CM | POA: Diagnosis not present

## 2018-01-21 DIAGNOSIS — I130 Hypertensive heart and chronic kidney disease with heart failure and stage 1 through stage 4 chronic kidney disease, or unspecified chronic kidney disease: Secondary | ICD-10-CM | POA: Diagnosis not present

## 2018-01-21 DIAGNOSIS — K5791 Diverticulosis of intestine, part unspecified, without perforation or abscess with bleeding: Secondary | ICD-10-CM | POA: Diagnosis not present

## 2018-01-21 DIAGNOSIS — D8989 Other specified disorders involving the immune mechanism, not elsewhere classified: Secondary | ICD-10-CM | POA: Diagnosis not present

## 2018-01-21 DIAGNOSIS — J9611 Chronic respiratory failure with hypoxia: Secondary | ICD-10-CM | POA: Diagnosis not present

## 2018-01-25 ENCOUNTER — Telehealth (HOSPITAL_COMMUNITY): Payer: Self-pay

## 2018-01-25 NOTE — Telephone Encounter (Signed)
Pt called stating her bp has been low in the past week. On 10/20: 91/59, 10/21 85/60. Pt has an appt to see Amy on 10/25. Please advise.

## 2018-01-26 DIAGNOSIS — J9611 Chronic respiratory failure with hypoxia: Secondary | ICD-10-CM | POA: Diagnosis not present

## 2018-01-26 DIAGNOSIS — D8989 Other specified disorders involving the immune mechanism, not elsewhere classified: Secondary | ICD-10-CM | POA: Diagnosis not present

## 2018-01-26 DIAGNOSIS — I272 Pulmonary hypertension, unspecified: Secondary | ICD-10-CM | POA: Diagnosis not present

## 2018-01-26 DIAGNOSIS — I2510 Atherosclerotic heart disease of native coronary artery without angina pectoris: Secondary | ICD-10-CM | POA: Diagnosis not present

## 2018-01-26 DIAGNOSIS — K5791 Diverticulosis of intestine, part unspecified, without perforation or abscess with bleeding: Secondary | ICD-10-CM | POA: Diagnosis not present

## 2018-01-26 DIAGNOSIS — I130 Hypertensive heart and chronic kidney disease with heart failure and stage 1 through stage 4 chronic kidney disease, or unspecified chronic kidney disease: Secondary | ICD-10-CM | POA: Diagnosis not present

## 2018-01-26 NOTE — Telephone Encounter (Signed)
   Pt called into answering service re: low BP's over the past couple of days.  She feels fatigued when pressures are below 110 and she has been running high 80's to 90's.  She is on lasix 73m daily but otw is not on any antihypertensives.  Her wt has actually been coming down.  I have recommended that if SBP <90, she should hold lasix.  If > 90, only take 286mdaily.  She has f/u with AmDarrick Grindern 10/25.  Caller verbalized understanding and was grateful for the call back.  ChMurray HodgkinsNP 01/26/2018, 6:02 PM

## 2018-01-27 DIAGNOSIS — D8989 Other specified disorders involving the immune mechanism, not elsewhere classified: Secondary | ICD-10-CM | POA: Diagnosis not present

## 2018-01-27 DIAGNOSIS — K5791 Diverticulosis of intestine, part unspecified, without perforation or abscess with bleeding: Secondary | ICD-10-CM | POA: Diagnosis not present

## 2018-01-27 DIAGNOSIS — I130 Hypertensive heart and chronic kidney disease with heart failure and stage 1 through stage 4 chronic kidney disease, or unspecified chronic kidney disease: Secondary | ICD-10-CM | POA: Diagnosis not present

## 2018-01-27 DIAGNOSIS — I272 Pulmonary hypertension, unspecified: Secondary | ICD-10-CM | POA: Diagnosis not present

## 2018-01-27 DIAGNOSIS — F0631 Mood disorder due to known physiological condition with depressive features: Secondary | ICD-10-CM | POA: Diagnosis not present

## 2018-01-27 DIAGNOSIS — I2510 Atherosclerotic heart disease of native coronary artery without angina pectoris: Secondary | ICD-10-CM | POA: Diagnosis not present

## 2018-01-27 DIAGNOSIS — J9611 Chronic respiratory failure with hypoxia: Secondary | ICD-10-CM | POA: Diagnosis not present

## 2018-01-28 DIAGNOSIS — I2510 Atherosclerotic heart disease of native coronary artery without angina pectoris: Secondary | ICD-10-CM | POA: Diagnosis not present

## 2018-01-28 DIAGNOSIS — J9611 Chronic respiratory failure with hypoxia: Secondary | ICD-10-CM | POA: Diagnosis not present

## 2018-01-28 DIAGNOSIS — I130 Hypertensive heart and chronic kidney disease with heart failure and stage 1 through stage 4 chronic kidney disease, or unspecified chronic kidney disease: Secondary | ICD-10-CM | POA: Diagnosis not present

## 2018-01-28 DIAGNOSIS — I272 Pulmonary hypertension, unspecified: Secondary | ICD-10-CM | POA: Diagnosis not present

## 2018-01-28 DIAGNOSIS — D8989 Other specified disorders involving the immune mechanism, not elsewhere classified: Secondary | ICD-10-CM | POA: Diagnosis not present

## 2018-01-28 DIAGNOSIS — K5791 Diverticulosis of intestine, part unspecified, without perforation or abscess with bleeding: Secondary | ICD-10-CM | POA: Diagnosis not present

## 2018-01-29 ENCOUNTER — Ambulatory Visit (HOSPITAL_COMMUNITY)
Admission: RE | Admit: 2018-01-29 | Discharge: 2018-01-29 | Disposition: A | Discharge status: Home / Self Care | Payer: Medicare Other | Source: Ambulatory Visit | Attending: Cardiology | Admitting: Cardiology

## 2018-01-29 ENCOUNTER — Encounter (HOSPITAL_COMMUNITY): Payer: Self-pay

## 2018-01-29 VITALS — BP 124/80 | HR 77 | Wt 186.2 lb

## 2018-01-29 DIAGNOSIS — G4733 Obstructive sleep apnea (adult) (pediatric): Secondary | ICD-10-CM | POA: Insufficient documentation

## 2018-01-29 DIAGNOSIS — Z7901 Long term (current) use of anticoagulants: Secondary | ICD-10-CM | POA: Insufficient documentation

## 2018-01-29 DIAGNOSIS — I2721 Secondary pulmonary arterial hypertension: Secondary | ICD-10-CM | POA: Diagnosis not present

## 2018-01-29 DIAGNOSIS — I270 Primary pulmonary hypertension: Secondary | ICD-10-CM | POA: Diagnosis not present

## 2018-01-29 DIAGNOSIS — E039 Hypothyroidism, unspecified: Secondary | ICD-10-CM | POA: Insufficient documentation

## 2018-01-29 DIAGNOSIS — Z87891 Personal history of nicotine dependence: Secondary | ICD-10-CM | POA: Diagnosis not present

## 2018-01-29 DIAGNOSIS — Z8249 Family history of ischemic heart disease and other diseases of the circulatory system: Secondary | ICD-10-CM | POA: Insufficient documentation

## 2018-01-29 DIAGNOSIS — J449 Chronic obstructive pulmonary disease, unspecified: Secondary | ICD-10-CM | POA: Diagnosis not present

## 2018-01-29 DIAGNOSIS — I2510 Atherosclerotic heart disease of native coronary artery without angina pectoris: Secondary | ICD-10-CM | POA: Insufficient documentation

## 2018-01-29 DIAGNOSIS — I422 Other hypertrophic cardiomyopathy: Secondary | ICD-10-CM | POA: Insufficient documentation

## 2018-01-29 DIAGNOSIS — I5032 Chronic diastolic (congestive) heart failure: Secondary | ICD-10-CM | POA: Insufficient documentation

## 2018-01-29 DIAGNOSIS — E785 Hyperlipidemia, unspecified: Secondary | ICD-10-CM | POA: Insufficient documentation

## 2018-01-29 DIAGNOSIS — Z79899 Other long term (current) drug therapy: Secondary | ICD-10-CM | POA: Diagnosis not present

## 2018-01-29 DIAGNOSIS — D8989 Other specified disorders involving the immune mechanism, not elsewhere classified: Secondary | ICD-10-CM

## 2018-01-29 DIAGNOSIS — K219 Gastro-esophageal reflux disease without esophagitis: Secondary | ICD-10-CM | POA: Diagnosis not present

## 2018-01-29 DIAGNOSIS — I480 Paroxysmal atrial fibrillation: Secondary | ICD-10-CM

## 2018-01-29 LAB — HEPATIC FUNCTION PANEL
ALBUMIN: 3.3 g/dL — AB (ref 3.5–5.0)
ALK PHOS: 105 U/L (ref 38–126)
ALT: 25 U/L (ref 0–44)
AST: 27 U/L (ref 15–41)
BILIRUBIN TOTAL: 0.5 mg/dL (ref 0.3–1.2)
Bilirubin, Direct: 0.2 mg/dL (ref 0.0–0.2)
Indirect Bilirubin: 0.3 mg/dL (ref 0.3–0.9)
Total Protein: 6.4 g/dL — ABNORMAL LOW (ref 6.5–8.1)

## 2018-01-29 NOTE — Addendum Note (Signed)
Encounter addended by: Wilder Glade, LPN on: 58/83/2549 8:26 PM  Actions taken: Visit diagnoses modified, Order list changed, Diagnosis association updated

## 2018-01-29 NOTE — Progress Notes (Addendum)
PCP: Dr. Radene Ou Cardiology: Dr. Radford Pax HF Cardiology: Dr. Aundra Dubin Pulmonary: Dr. Lake Bells  72 y.o. with history of COPD, OSA on CPAP, apical hypertrophic cardiomyopathy, chronic diastolic CHF, and paroxysmal atrial fibrillation returns for followup of CHF and pulmonary hypertension.  Patient was admitted in 3/18 with CHF and dyspnea, found to have pulmonary arterial hypertension by RHC.  Prior to this, she has had a long history of PAF.  She is currently taking dronedarone but has had breakthrough fibrillation.  She is in NSR today.  She had an atrial fibrillation ablation in the past. She has apical hypertrophic cardiomyopathy proven by cardiac MRI with a consistent ECG.   She is on Opsumit and has started Selexipag, now titrated up to 1600 mcg bid. She was unable to tolerate Adcirca. Symptomatically, she has not seen significant improvement with selective pulmonary vasodilators to treat PAH.     I repeated her RHC in 3/19.  This showed lower PA pressure and normal right and left heart filling pressures, but more worrisomely, cardiac output was low.  However, on repeat of echo in 3/19, the RV actually looked normal in size and systolic function.  Given concern for possible PVOD as cause of her worsening symptoms on treatment, I ordered a high resolution CT chest.  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  I had her see Dr Lake Bells. He sent autoimmune serologies again, weak P-ANCA positivity so she was referred to rheumatology.  Echo bubble study was negative.  CCP and Ro-52 were positive.  Also of note, she has had atrial fibrillation ablation and pulmonary vein stenosis is a consideration.  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  She was started on prednisone and Cellcept.  Cellcept was stopped after recent GI  bleed.   In 9/19, she was admitted with a GI bleed.  Colonoscopy did not show a definite cause.  She was started back on Eliquis.   Today she returns for HF follow up. She had been having trouble with hypotension. On 10/22 she was instructed to hold lasix if her SBP is < 90 and if SBP> 90 take 20 mg lasix daily. She has not had lasix in 3 days due to low SBP. Overall feeling better. Says this is the best she has felt in a long time.  Having tooth pain. Denies SOB/PND/Orthopnea. Does require breaks when she performs ADLs. No dizziness or presyncope. Appetite ok. No fever or chills. Weight at home 182-183  pounds. Taking all medications. No bleeding problems. Continue [facility] PT.    Labs (2/18): TSH normal Labs (3/18): K 3.8, creatinine 1.26, BNP 424 Labs (5/18): K 4.4, creatinine 1.96, anti-SCL 70 negative, RF very slightly elevated, HIV negative.  Labs (6/18): K 4, creatinine 1.2, LDL 49 Labs (9/18): BNP 149, K 4, creatinine 1.4, BNP 149 Labs (2/19): BNP 126, K 3.8, creatinine 1.5, CCP+, Ro-52+, elevated CK.  Labs (7/19): BNP 676, K 4.7, creatinine 1.74 Labs (9/19): K 3.8, creatinine 1.39, BNP 220 Labs 01/13/2018: K 3.7 Creatinine 1.25    4/18 6 minute walk: 109 m  10/18 6 minute walk: 229 m 2/19 6 minute walk: < 100 m 5/19 6 minute walk: 107 m  PMH:  1. COPD: Quit smoking 2011.  - PFTs (3/18): minimal obstruction, minimal restriction, DLCO 22% (severely decreased).  2. Hypothyroidism 3. Hyperlipidemia 4. OSA: Uses CPAP 5. Apical hypertrophic cardiomyopathy:  -  Cardiac MRI (7/16) with EF 79%, mid-apical LV hypertrophy with spade-like ventricle, mid-myocardial LGE was noted at the apex.   6. GERD 7. Atrial fibrillation: Paroxysmal.  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 Long QT interval so dofetilide and sotalol have not been used. She has had atrial fibrillation ablation.  - Currently on dronedarone.  - DCCV to NSR in ER 7/19.  8. Coronary artery disease: LHC (7/16) with nonobstructive  disease + 85% ostial stenosis small D1.  9. Diastolic CHF: Echo (8/41) with EF 65-70%, apical hypertrophic cardiomyopathy, severe LAE, PASP 86 mmHg.  - RHC (3/18): mean RA 4, PA 69/23 mean 38, mean PCWP 8, CI 2.17, PVR 7.3 WU Fick, 9.6 WU thermo.  - Echo (3/19): EF 65-70%, apical hypertrophic cardiomyopathy, normal RV size and systolic function, severe LAE.  - Echo (6/19): EF 65-70%, apical HCM, restrictive diastolic dysfunction, negative bubble study, PASP 42 mmHg.  10. Pulmonary hypertension: See RHC above.  - V/Q scan negative 3/18 - HIV, SCL70 negative.  RF borderline elevated, doubt significant.  ANA negative.  - Unable to tolerate Adcirca - RHC (3/19): mean RA 6, PA 49/16 mean 29, mean PCWP 10, CI 1.9/PVR 5.3 WU Fick, CI 1.4/PVR 7.3 Thermo.  - High resolution CT chest (3/19): respiratory motion artifact present, but suspect mild emphysema with lungs grossly clear.  - P-ANCA+, CCP+, RO-52+, CK elevated.  - Echo 6/19 with negative bubble study.  11. CPX (12/17): peak VO2 8.5, RER 0.96, VE/VCO2 slope 67. Submaximal but appears to show severe functional impairment.   12. CKD: Stage 3.  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   Social History   Socioeconomic History  . Marital status: Widowed    Spouse name: divorced.  . Number of children: 1  . Years of education: Not on file  . Highest education level: Not on file  Occupational History  . Occupation: retired. still works for [employer].   Social Needs  . Financial resource strain: Not hard at all  . Food insecurity:    Worry: Never true    Inability: Never true  . Transportation needs:    Medical: No    Non-medical: No  Tobacco Use  . Smoking status: Former Smoker    Packs/day: 0.50    Years: 44.00    Pack years: 22.00    Types: Cigarettes    Last attempt to quit: 04/07/2009    Years since quitting: 8.8  . Smokeless tobacco: Never Used   Substance and Sexual Activity  . Alcohol use: No  . Drug use: No  . Sexual activity: Never  Lifestyle  . Physical activity:    Days per week: Not on file    Minutes per session: Not on file  . Stress: Not on file  Relationships  . Social connections:    Talks on phone: Not on file    Gets together: Not on file    Attends religious service: Not on file    Active member of club or organization: Not on file    Attends meetings of clubs or organizations: Not on file    Relationship status: Not on file  . Intimate partner violence:    Fear of current or ex partner: Not on file    Emotionally abused: Not on file    Physically abused: Not on file    Forced sexual activity: Not on file  Other Topics Concern  . Not on file  Social History Narrative  Pt lives alone with 2 dogs.    Family History  Problem Relation Age of Onset  . Heart disease Sister        Good Pastures Syndrome  . Gout Father   . Lung cancer Father        smoked  . Breast cancer Paternal Aunt    Review of systems complete and found to be negative unless listed in HPI.    Current Outpatient Medications  Medication Sig Dispense Refill  . acetaminophen (TYLENOL) 325 MG tablet Take 2 tablets (650 mg total) by mouth every 4 (four) hours as needed for headache or mild pain.    Marland Kitchen AMOXICILLIN PO Take 825 mg by mouth. Pt is  Twice a day with meals    . apixaban (ELIQUIS) 5 MG TABS tablet Take 1 tablet (5 mg total) by mouth 2 (two) times daily. 60 tablet 1  . atorvastatin (LIPITOR) 80 MG tablet Take 1 tablet (80 mg total) by mouth daily with lunch. 90 tablet 3  . Cholecalciferol (VITAMIN D) 2000 UNITS CAPS Take 2,000 Units by mouth daily.     . colchicine 0.6 MG tablet Take 0.6 mg by mouth daily.     Marland Kitchen dronedarone (MULTAQ) 400 MG tablet Take 1 tablet (400 mg total) by mouth 2 (two) times daily with a meal. 60 tablet 0  . ezetimibe (ZETIA) 10 MG tablet Take 1 tablet (10 mg total) by mouth daily. Patient overdue for appt  w/Dr Turner,must call and schedule for further refills3rd/final attempt 15 tablet 0  . ferrous sulfate 325 (65 FE) MG tablet Take 1 tablet (325 mg total) by mouth 2 (two) times daily with a meal. 60 tablet 3  . folic acid (FOLVITE) 1 MG tablet Take 1 tablet (1 mg total) by mouth daily. 30 tablet 0  . furosemide (LASIX) 20 MG tablet Take 2 tablets (40 mg total) by mouth daily. 60 tablet 6  . ipratropium-albuterol (DUONEB) 0.5-2.5 (3) MG/3ML SOLN Take 3 mLs by nebulization 3 (three) times daily. DX: J44.9 (Patient taking differently: Take 3 mLs by nebulization 3 (three) times daily. DX: J44.9) 360 mL 5  . levothyroxine (SYNTHROID, LEVOTHROID) 75 MCG tablet Take 75 mcg by mouth daily before breakfast.     . Macitentan (OPSUMIT) 10 MG TABS Take 1 tablet (10 mg total) by mouth daily. 30 tablet 11  . Multiple Vitamin (MULTIVITAMIN) capsule Take 1 capsule by mouth daily.    . pantoprazole (PROTONIX) 40 MG tablet Take 1 tablet (40 mg total) by mouth daily. 90 tablet 2  . potassium chloride SA (K-DUR,KLOR-CON) 20 MEQ tablet Take 1 tablet (20 mEq total) by mouth daily. (Patient taking differently: Take 20 mEq by mouth daily. Pt taking differently 1/2 tab of 20 mEq daily) 30 tablet 6  . Selexipag (UPTRAVI) 1600 MCG TABS Take 1,600 mcg by mouth 2 (two) times daily.     Marland Kitchen UNABLE TO FIND Med Name: CPAP  DME[facility]    . vitamin B-12 100 MCG tablet Take 1 tablet (100 mcg total) by mouth daily. 30 tablet 0   No current facility-administered medications for this encounter.    BP 124/80   Pulse 77   Wt 84.5 kg (186 lb 3.2 oz)   SpO2 95%   BMI 34.06 kg/m   Wt Readings from Last 3 Encounters:  01/29/18 84.5 kg (186 lb 3.2 oz)  01/19/18 84.8 kg (187 lb)  01/01/18 88.2 kg (194 lb 6.4 oz)   General:  Well appearing. No resp  difficulty HEENT: normal except swelling noted r cheek Neck: supple. JVP 6-7 . Carotids 2+ bilat; no bruits. No lymphadenopathy or thryomegaly appreciated. Cor: PMI nondisplaced. Regular rate  & rhythm. No rubs, gallops or murmurs. Lungs: clear on 2 liters oxygen.  Abdomen: soft, nontender, nondistended. No hepatosplenomegaly. No bruits or masses. Good bowel sounds. Extremities: no cyanosis, clubbing, rash, edema Neuro: alert & orientedx3, cranial nerves grossly intact. moves all 4 extremities w/o difficulty. Affect pleasant  EKG: NSR 72 bpm   Assessment/Plan: 1. Chronic diastolic CHF: Echo 7/41 with EF 65-70%, normal RV size and systolic function.  Given worsening symptoms and the finding of low cardiac output on RHC.  6/19 echo, the RV looked fairly normal.  NYHA III. Volume status stable. Continue lasix 20 mg daily if SBP >90 . If SBP lower than 90 she will hold lasix.   2. Apical hypertrophic cardiomyopathy: Cardiac MRI in 7/16 showed mid-apical LV hypertrophy with vigorous contraction. Now off metoprolol with slow HR (uses metoprolol prn tachycardia - atrial fibrillation).  ECG is consistent with apical HCM.   3. Atrial fibrillation:   In NSR today.  Continue Multaq.  Per Dr Rayann Heman, would not re-do her ablation. Breakthrough atrial fibrillation in 7/19, DCCV back to NSR in ER She can hold eliquis 24 hours prior to tooth extraction and restart 24 hours after procedure.  - We faxed noted to oral surgeon. .     - Continue dronedarone.   4. Pulmonary hypertension: RHC in 3/18 showed moderate pulmonary arterial hypertension with elevated PVR (7.3 WU by Fick, 9.6 WU by thermo). She has COPD but I do not think this plays a large role in the pulmonary hypertension =>  PFTs in 3/18 showed minimal obstruction and restriction, there was significantly decreased DLCO, this may be due to pulmonary vascular disease.  OSA may play a role but not a large one. V/Q scan did not suggest chronic PEs in 3/18.  RF borderline elevated (probably not significant), HIV negative, Anti-SCL70 negative, ANA negative, P-ANCA weakly positive.  Supect who  group 1 PAH, but she has not significantly improved with  pulmonary vasodilators.  Her RHC in 3/19 showed lower PA pressure and PVR with normal right and left heart filling pressures, but cardiac output was lower than prior.  Surprisingly, her RV looked normal on echo so I question how accurate the cardiac output measurement really was.  Bubble study negative on echo.  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  - Also of note, she has a history of afib ablation so pulmonary vein stenosis certainly remains a concern for raising PA pressure but think this is unlikely as the cause for her presentation.   - No issues today.   - Continue Opsumit.     - She did not tolerate Adcirca.   - Continue Selexipag 1600 bid.  5. Anti-synthetase syndrome: Elevated CK, positive myositis panel, nonspecific interstitial pneumonia.  - She has now stopped Cellcept since episode of GI bleeding.  - Dr. Lake Bells has arranged a repeat high resolution CT chest.   Plan 6MW next visit.  Follow up with Dr Aundra Dubin in 2 months. Greater than 50% of the (total minutes 25) visit spent in counseling/coordination of care regarding the above.  Darrick Grinder, NP  01/29/2018

## 2018-01-29 NOTE — Addendum Note (Signed)
Encounter addended by: Conrad Murtaugh, NP on: 01/29/2018 12:04 PM  Actions taken: Sign clinical note

## 2018-01-29 NOTE — Addendum Note (Signed)
Encounter addended by: Jorge Ny, LCSW on: 01/29/2018 3:11 PM  Actions taken: Visit Navigator Flowsheet section accepted

## 2018-01-29 NOTE — Patient Instructions (Addendum)
Today you have been seen at the Heart failure clinic at [facility]   Medication changes: Hold Eliquis Tuesday and Wednesday restart Eliquis on Thursday   Lab work done today:Hepatic function panel   We will call you if your lab work is abnormal.. No news is good news!!   Keep your Follow up with Dr.Mclean  You have been referred to: advance [facility] speech therapy   Do the following things EVERYDAY: 1) Weigh yourself in the morning before breakfast. Write it down and keep it in a log. 2) Take your medicines as prescribed 3) Eat low salt foods-Limit salt (sodium) to 2000 mg per day.  4) Stay as active as you can everyday 5) Limit all fluids for the day to less than 2 liters

## 2018-02-01 DIAGNOSIS — J9611 Chronic respiratory failure with hypoxia: Secondary | ICD-10-CM | POA: Diagnosis not present

## 2018-02-01 DIAGNOSIS — I2510 Atherosclerotic heart disease of native coronary artery without angina pectoris: Secondary | ICD-10-CM | POA: Diagnosis not present

## 2018-02-01 DIAGNOSIS — K5791 Diverticulosis of intestine, part unspecified, without perforation or abscess with bleeding: Secondary | ICD-10-CM | POA: Diagnosis not present

## 2018-02-01 DIAGNOSIS — I130 Hypertensive heart and chronic kidney disease with heart failure and stage 1 through stage 4 chronic kidney disease, or unspecified chronic kidney disease: Secondary | ICD-10-CM | POA: Diagnosis not present

## 2018-02-01 DIAGNOSIS — I272 Pulmonary hypertension, unspecified: Secondary | ICD-10-CM | POA: Diagnosis not present

## 2018-02-01 DIAGNOSIS — D8989 Other specified disorders involving the immune mechanism, not elsewhere classified: Secondary | ICD-10-CM | POA: Diagnosis not present

## 2018-02-02 DIAGNOSIS — D8989 Other specified disorders involving the immune mechanism, not elsewhere classified: Secondary | ICD-10-CM | POA: Diagnosis not present

## 2018-02-02 DIAGNOSIS — K5791 Diverticulosis of intestine, part unspecified, without perforation or abscess with bleeding: Secondary | ICD-10-CM | POA: Diagnosis not present

## 2018-02-02 DIAGNOSIS — J9611 Chronic respiratory failure with hypoxia: Secondary | ICD-10-CM | POA: Diagnosis not present

## 2018-02-02 DIAGNOSIS — I272 Pulmonary hypertension, unspecified: Secondary | ICD-10-CM | POA: Diagnosis not present

## 2018-02-02 DIAGNOSIS — I2510 Atherosclerotic heart disease of native coronary artery without angina pectoris: Secondary | ICD-10-CM | POA: Diagnosis not present

## 2018-02-02 DIAGNOSIS — I130 Hypertensive heart and chronic kidney disease with heart failure and stage 1 through stage 4 chronic kidney disease, or unspecified chronic kidney disease: Secondary | ICD-10-CM | POA: Diagnosis not present

## 2018-02-04 DIAGNOSIS — I130 Hypertensive heart and chronic kidney disease with heart failure and stage 1 through stage 4 chronic kidney disease, or unspecified chronic kidney disease: Secondary | ICD-10-CM | POA: Diagnosis not present

## 2018-02-04 DIAGNOSIS — I2510 Atherosclerotic heart disease of native coronary artery without angina pectoris: Secondary | ICD-10-CM | POA: Diagnosis not present

## 2018-02-04 DIAGNOSIS — I272 Pulmonary hypertension, unspecified: Secondary | ICD-10-CM | POA: Diagnosis not present

## 2018-02-04 DIAGNOSIS — K5791 Diverticulosis of intestine, part unspecified, without perforation or abscess with bleeding: Secondary | ICD-10-CM | POA: Diagnosis not present

## 2018-02-04 DIAGNOSIS — F0631 Mood disorder due to known physiological condition with depressive features: Secondary | ICD-10-CM | POA: Diagnosis not present

## 2018-02-04 DIAGNOSIS — D8989 Other specified disorders involving the immune mechanism, not elsewhere classified: Secondary | ICD-10-CM | POA: Diagnosis not present

## 2018-02-04 DIAGNOSIS — J9611 Chronic respiratory failure with hypoxia: Secondary | ICD-10-CM | POA: Diagnosis not present

## 2018-02-05 ENCOUNTER — Encounter: Payer: Self-pay | Admitting: Pulmonary Disease

## 2018-02-05 ENCOUNTER — Ambulatory Visit (INDEPENDENT_AMBULATORY_CARE_PROVIDER_SITE_OTHER): Payer: Medicare Other | Admitting: Pulmonary Disease

## 2018-02-05 VITALS — BP 108/70 | HR 82 | Ht 61.42 in | Wt 184.0 lb

## 2018-02-05 DIAGNOSIS — I272 Pulmonary hypertension, unspecified: Secondary | ICD-10-CM | POA: Diagnosis not present

## 2018-02-05 DIAGNOSIS — D8989 Other specified disorders involving the immune mechanism, not elsewhere classified: Secondary | ICD-10-CM | POA: Diagnosis not present

## 2018-02-05 DIAGNOSIS — I130 Hypertensive heart and chronic kidney disease with heart failure and stage 1 through stage 4 chronic kidney disease, or unspecified chronic kidney disease: Secondary | ICD-10-CM | POA: Diagnosis not present

## 2018-02-05 DIAGNOSIS — K5791 Diverticulosis of intestine, part unspecified, without perforation or abscess with bleeding: Secondary | ICD-10-CM | POA: Diagnosis not present

## 2018-02-05 DIAGNOSIS — I270 Primary pulmonary hypertension: Secondary | ICD-10-CM | POA: Diagnosis not present

## 2018-02-05 DIAGNOSIS — J9611 Chronic respiratory failure with hypoxia: Secondary | ICD-10-CM | POA: Diagnosis not present

## 2018-02-05 DIAGNOSIS — I2510 Atherosclerotic heart disease of native coronary artery without angina pectoris: Secondary | ICD-10-CM

## 2018-02-05 DIAGNOSIS — J849 Interstitial pulmonary disease, unspecified: Secondary | ICD-10-CM

## 2018-02-05 MED ORDER — UMECLIDINIUM-VILANTEROL 62.5-25 MCG/INH IN AEPB: 1.0000 | INHALATION_SPRAY | Freq: Every day | RESPIRATORY_TRACT | 0 refills | Status: DC

## 2018-02-05 NOTE — Progress Notes (Signed)
Synopsis: Patient of Dr. Melvyn Novas and Aundra Dubin with pulmonary hypertension, centrilobular emphysema, prior concern for possible amiodarone toxicity, OSA and asthma.  She also has diastolic heart failure.   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  She used to smoke cigarettes from age 72 until age 16.    Subjective:   PATIENT ID: Amy [name] GENDER: female DOB: [birth year], MRN: [id number]   HPI  Chief Complaint  Patient presents with  . Follow-up    stopped eliquis for oral surgery and restarted yesterday   She says that she had a tooth removed earlier this week with local anesthesia and she tolerated it well.    Her breathing has been the same since the last visit.  She says taht she is compliant with her oxygen but she doesn't use the albuterol that much because it makes her "jittery".  She says that she has not been coughing, no mucus production she has not been sick.  She had a flu shot recently.  She continues to take her diuretics and her pulmonary hypertension medicines.   Past Medical History:  Diagnosis Date  . Apical variant hypertrophic cardiomyopathy ([place])    Diagnosed by echo and ECG 11/13  . Atypical atrial flutter ([place])   . CAD (coronary artery disease)    a. LHC in 10/2011 demonstrated non-obstructive disease and an 80% lesion in a small D1 treated medically.   . CHF (congestive heart failure) ([place])   . Chronic diastolic heart failure ([place])   . CKD (chronic kidney disease), stage III ([place])   . COPD (chronic obstructive pulmonary disease) ([place])   . Dizziness    had PT for being off balance - in approx. 2012  . GERD (gastroesophageal reflux disease)   . Hyperlipidemia   . Hypertension   . Hypothyroid   . Lumbar disc disease   . Macular degeneration   . OSA (obstructive sleep apnea)   . Persistent atrial fibrillation       . Pulmonary hypertension  ([place])    a. [place] 05/2013 showed mild PAH with normal PCWP and RA pressure, could be related to OSA and low oxygen saturation.   . Refusal of blood transfusions as patient is Jehovah's Witness   . Sinus bradycardia      Review of Systems  Constitutional: Positive for malaise/fatigue. Negative for chills, fever and weight loss.  HENT: Negative for congestion, nosebleeds, sinus pain and sore throat.   Eyes: Negative for photophobia, pain and discharge.  Respiratory: Positive for cough and shortness of breath. Negative for hemoptysis, sputum production and wheezing.   Cardiovascular: Positive for leg swelling. Negative for chest pain, palpitations and orthopnea.  Gastrointestinal: Negative for abdominal pain, constipation, diarrhea, nausea and vomiting.  Genitourinary: Negative for dysuria, frequency, hematuria and urgency.  Musculoskeletal: Negative for back pain, joint pain, myalgias and neck pain.  Skin: Negative for itching and rash.  Neurological: Negative for tingling, tremors, sensory change, speech change, focal weakness, seizures, weakness and headaches.  Psychiatric/Behavioral: Negative for memory loss, substance abuse and suicidal ideas. The patient is not nervous/anxious.       Objective:  Physical Exam   Vitals:   02/05/18 1032  BP: 108/70  Pulse: 82  SpO2: 96%  Weight: 184 lb (83.5 kg)  Height: 5' 1.42" (1.56 m)   4L [place]  Gen: Chronically ill appearing HENT: OP clear, TM's clear, neck supple  PULM:  Normal air movement, normal percussion CV: RRR, no mgr, trace edema GI: BS+, soft, nontender Derm: no cyanosis or rash Psyche: normal mood and affect     CBC    Component Value Date/Time   WBC 5.9 01/01/2018 1252   RBC 3.44 (L) 01/01/2018 1252   HGB 11.1 (L) 01/01/2018 1252   HCT 34.6 (L) 01/01/2018 1252   PLT 229.0 01/01/2018 1252   MCV 100.6 (H) 01/01/2018 1252   MCH 31.4 12/15/2017 1400   MCHC 32.0 01/01/2018 1252   RDW 22.7 (H) 01/01/2018 1252    LYMPHSABS 0.3 (L) 01/01/2018 1252   MONOABS 0.6 01/01/2018 1252   EOSABS 0.0 01/01/2018 1252   BASOSABS 0.0 01/01/2018 1252     Chest imaging: - V/Q scan negative 3/22 June 2017 high-resolution CT scan of the chest images independently reviewed showing patchy groundglass throughout with air trapping but no clear evidence of underlying interstitial lung disease.  Some evidence of emphysema seen, motion degraded images October 2019 high-resolution CT scan of the chest: Prominently dilated main pulmonary artery, some patchy groundglass attenuation throughout both lungs, somewhat centrilobular, scattered subpleural reticulation differential diagnosis includes NSIP versus UIP.  Some centrilobular emphysema noted, air trapping noted.  Images independently reviewed, there is definitely moderate centrilobular emphysema and an upper lobe predominant fashion as well as centrilobular groundglass  PFT: March 2018 ratio 83%, FVC 1.88 L 89% predicted, significant change with bronchodilator in the small airways, total lung capacity 3.63 L 76% predicted, DLCO 4.75 mL 22% predicted September 2019 pulmonary function testing: FVC 1.81 L 87% predicted, DLCO 7.5 mL 32% predicted   Labs: - HIV, SCL70 negative.  RF borderline elevated, doubt significant.  ANA negative.  - Lab work from rheumatology in 2019:  P-ANCA test was negative, myositis panel showed an elevated Ro-52 lab and the CK was elevated at 1030, CCP elevated -September 2019 CK total 196, Ro 52 antibody positive on myositis panel  Path:  Echo: Echo (3/18) with EF 65-70%, apical hypertrophic cardiomyopathy, severe LAE, PASP 86 mmHg.  Echo (3/19): EF 65-70%, apical hypertrophic cardiomyopathy, normal RV size and systolic function, severe LAE.  Echo 6/19: EF normal, BUBBLE STUDY NEGATIVE  Heart Catheterization: - RHC (3/18): mean RA 4, PA 69/23 mean 38, mean PCWP 8, CI 2.17, PVR 7.3 WU Fick, 9.6 WU thermo.  - RHC (3/19): mean RA 6, PA 49/16 mean  29, mean PCWP 10, CI 1.9/PVR 5.3 WU Fick, CI 1.4/PVR 7.3 Thermo.   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  Fortunately however her CK level was much lower.  From an objective standpoint there is little evidence that pulmonary parenchymal disease is worsening though I agree that there is a faint centrilobular groundglass seen in an upper lobe distribution.  I think what I failed to recognize in the past was the significance of her centrilobular emphysema and smoking history.  So in retrospect the addition of pulmonary vasodilators by the cardiology clinic over the last year in the presence of underlying centrilobular emphysema is a reasonable explanation for her  worsening oxygenation.  At this time I do not think its best for [name] to use any more immunosuppressants for this mild pulmonary parenchymal disease of undetermined significance.  That being said I think she needs to see a rheumatologist with anti-synthetase syndrome to help [name] better monitor this and know when to initiate immunosuppressive therapy.  Plan: Centrilobular emphysema: This is the medical term that implies that your lung was damaged from prior tobacco use Take Anoro 1 puff daily no matter how you feel, call me if you think this is helpful and I can call in a prescription Use the DuoNeb you have at home only as needed for chest tightness wheezing or shortness of breath I am glad you have had a flu shot Practice good hand hygiene Stay active  Anti-synthetase syndrome: This is an autoimmune  condition that can attack the lungs and your muscles.  Earlier this year your muscle enzymes were much higher than they are now which is a good thing.  I have very little evidence right now that your lungs are being attacked by this syndrome so I do not feel the need to treat further.  However, I would like for you to see a specialist in this condition at [facility] so we will make a referral  Pulmonary hypertension: Continue treatment with macitentan and selexipeg as directed by the cardiology clinic Continue furosemide  Chronic respiratory failure with hypoxemia: Keep using 4 L of oxygen continuously  Follow-up with me in 8 to 10 weeks after you have seen the physicians at [facility]  > 50% of this 40 minute visit spent face to face   Current Outpatient Medications:  .  acetaminophen (TYLENOL) 325 MG tablet, Take 2 tablets (650 mg total) by mouth every 4 (four) hours as needed for headache or mild pain., Disp: , Rfl:  .  apixaban (ELIQUIS) 5 MG TABS tablet, Take 1 tablet (5 mg total) by mouth 2 (two) times daily., Disp: 60 tablet, Rfl: 1 .  atorvastatin (LIPITOR) 80 MG tablet, Take 1 tablet (80 mg total) by mouth daily with lunch., Disp: 90 tablet, Rfl: 3 .  Cholecalciferol (VITAMIN D) 2000 UNITS CAPS, Take 2,000 Units by mouth daily. , Disp: , Rfl:  .  colchicine 0.6 MG tablet, Take 0.6 mg by mouth daily. , Disp: , Rfl:  .  dronedarone (MULTAQ) 400 MG tablet, Take 1 tablet (400 mg total) by mouth 2 (two) times daily with a meal., Disp: 60 tablet, Rfl: 0 .  ezetimibe (ZETIA) 10 MG tablet, Take 1 tablet (10 mg total) by mouth daily. Patient overdue for appt w/Dr Turner,must call and schedule for further refills3rd/final attempt, Disp: 15 tablet, Rfl: 0 .  ferrous sulfate 325 (65 FE) MG tablet, Take 1 tablet (325 mg total) by mouth 2 (two) times daily with a meal., Disp: 60 tablet, Rfl: 3 .  folic acid (FOLVITE) 1 MG tablet, Take 1 tablet (1 mg total) by mouth daily., Disp: 30 tablet, Rfl: 0 .   furosemide (LASIX) 20 MG tablet, Take 2 tablets (40 mg total) by mouth daily., Disp: 60 tablet, Rfl: 6 .  ipratropium-albuterol (DUONEB) 0.5-2.5 (3) MG/3ML SOLN, Take 3 mLs by nebulization 3 (three) times daily. DX: J44.9 (Patient taking differently: Take 3 mLs by nebulization 3 (three) times daily. DX: J44.9), Disp: 360 mL, Rfl: 5 .  levothyroxine (SYNTHROID, LEVOTHROID) 75 MCG tablet, Take 75 mcg by mouth daily before breakfast. , Disp: , Rfl:  .  Macitentan (OPSUMIT) 10 MG TABS, Take 1  tablet (10 mg total) by mouth daily., Disp: 30 tablet, Rfl: 11 .  Multiple Vitamin (MULTIVITAMIN) capsule, Take 1 capsule by mouth daily., Disp: , Rfl:  .  pantoprazole (PROTONIX) 40 MG tablet, Take 1 tablet (40 mg total) by mouth daily., Disp: 90 tablet, Rfl: 2 .  potassium chloride SA (K-DUR,KLOR-CON) 20 MEQ tablet, Take 1 tablet (20 mEq total) by mouth daily. (Patient taking differently: Take 20 mEq by mouth daily. Pt taking differently 1/2 tab of 20 mEq daily), Disp: 30 tablet, Rfl: 6 .  Selexipag (UPTRAVI) 1600 MCG TABS, Take 1,600 mcg by mouth 2 (two) times daily. , Disp: , Rfl:  .  UNABLE TO FIND, Med Name: CPAP  DME-[facility], Disp: , Rfl:  .  vitamin B-12 100 MCG tablet, Take 1 tablet (100 mcg total) by mouth daily., Disp: 30 tablet, Rfl: 0 .  umeclidinium-vilanterol (ANORO ELLIPTA) 62.5-25 MCG/INH AEPB, Inhale 1 puff into the lungs daily., Disp: 1 each, Rfl: 0

## 2018-02-05 NOTE — Patient Instructions (Signed)
Centrilobular emphysema: This is the medical term that implies that your lung was damaged from prior tobacco use Take Anoro 1 puff daily no matter how you feel, call me if you think this is helpful and I can call in a prescription Use the DuoNeb you have at home only as needed for chest tightness wheezing or shortness of breath I am glad you have had a flu shot Practice good hand hygiene Stay active  Anti-synthetase syndrome: This is an autoimmune condition that can attack the lungs and your muscles.  Earlier this year your muscle enzymes were much higher than they are now which is a good thing.  I have very little evidence right now that your lungs are being attacked by this syndrome so I do not feel the need to treat further.  However, I would like for you to see a specialist in this condition at [facility] so we will make a referral  Pulmonary hypertension: Continue treatment with macitentan and selexipeg as directed by the cardiology clinic Continue furosemide  Chronic respiratory failure with hypoxemia: Keep using 4 L of oxygen continuously  Follow-up with me in 8 to 10 weeks after you have seen the physicians at [facility]  > 50% of this 40 minute visit spent face to face

## 2018-02-07 ENCOUNTER — Emergency Department (HOSPITAL_COMMUNITY): Payer: Medicare Other

## 2018-02-07 ENCOUNTER — Telehealth: Payer: Self-pay | Admitting: Physician Assistant

## 2018-02-07 ENCOUNTER — Encounter (HOSPITAL_COMMUNITY): Payer: Self-pay | Admitting: Emergency Medicine

## 2018-02-07 ENCOUNTER — Emergency Department (HOSPITAL_COMMUNITY)
Admission: EM | Admit: 2018-02-07 | Discharge: 2018-02-07 | Disposition: A | Discharge status: Home / Self Care | Payer: Medicare Other | Attending: Emergency Medicine | Admitting: Emergency Medicine

## 2018-02-07 DIAGNOSIS — R000 Tachycardia, unspecified: Secondary | ICD-10-CM | POA: Diagnosis not present

## 2018-02-07 DIAGNOSIS — I130 Hypertensive heart and chronic kidney disease with heart failure and stage 1 through stage 4 chronic kidney disease, or unspecified chronic kidney disease: Secondary | ICD-10-CM | POA: Diagnosis not present

## 2018-02-07 DIAGNOSIS — Z79899 Other long term (current) drug therapy: Secondary | ICD-10-CM | POA: Insufficient documentation

## 2018-02-07 DIAGNOSIS — R0902 Hypoxemia: Secondary | ICD-10-CM | POA: Diagnosis not present

## 2018-02-07 DIAGNOSIS — Z7901 Long term (current) use of anticoagulants: Secondary | ICD-10-CM | POA: Diagnosis not present

## 2018-02-07 DIAGNOSIS — J449 Chronic obstructive pulmonary disease, unspecified: Secondary | ICD-10-CM | POA: Insufficient documentation

## 2018-02-07 DIAGNOSIS — R002 Palpitations: Secondary | ICD-10-CM | POA: Diagnosis present

## 2018-02-07 DIAGNOSIS — E039 Hypothyroidism, unspecified: Secondary | ICD-10-CM | POA: Insufficient documentation

## 2018-02-07 DIAGNOSIS — R079 Chest pain, unspecified: Secondary | ICD-10-CM | POA: Diagnosis not present

## 2018-02-07 DIAGNOSIS — I4892 Unspecified atrial flutter: Secondary | ICD-10-CM | POA: Insufficient documentation

## 2018-02-07 DIAGNOSIS — N183 Chronic kidney disease, stage 3 (moderate): Secondary | ICD-10-CM | POA: Diagnosis not present

## 2018-02-07 DIAGNOSIS — Z87891 Personal history of nicotine dependence: Secondary | ICD-10-CM | POA: Diagnosis not present

## 2018-02-07 DIAGNOSIS — I5032 Chronic diastolic (congestive) heart failure: Secondary | ICD-10-CM | POA: Insufficient documentation

## 2018-02-07 DIAGNOSIS — R0602 Shortness of breath: Secondary | ICD-10-CM | POA: Diagnosis not present

## 2018-02-07 DIAGNOSIS — I4891 Unspecified atrial fibrillation: Secondary | ICD-10-CM | POA: Diagnosis not present

## 2018-02-07 HISTORY — DX: Other specified disorders involving the immune mechanism, not elsewhere classified: D89.89

## 2018-02-07 LAB — TROPONIN I
Troponin I: 0.68 ng/mL (ref <0.03)
Troponin I: 1.8 ng/mL (ref <0.03)
Troponin I: 2.41 ng/mL (ref <0.03)

## 2018-02-07 LAB — CBC WITH DIFFERENTIAL/PLATELET
Abs Immature Granulocytes: 0.04 10*3/uL (ref 0.00–0.07)
BASOS ABS: 0 10*3/uL (ref 0.0–0.1)
BASOS PCT: 0 %
EOS ABS: 0.2 10*3/uL (ref 0.0–0.5)
EOS PCT: 3 %
HEMATOCRIT: 38.5 % (ref 36.0–46.0)
Hemoglobin: 11.4 g/dL — ABNORMAL LOW (ref 12.0–15.0)
IMMATURE GRANULOCYTES: 1 %
LYMPHS ABS: 1 10*3/uL (ref 0.7–4.0)
Lymphocytes Relative: 13 %
MCH: 30.1 pg (ref 26.0–34.0)
MCHC: 29.6 g/dL — AB (ref 30.0–36.0)
MCV: 101.6 fL — ABNORMAL HIGH (ref 80.0–100.0)
Monocytes Absolute: 0.8 10*3/uL (ref 0.1–1.0)
Monocytes Relative: 11 %
NRBC: 0 % (ref 0.0–0.2)
Neutro Abs: 5.6 10*3/uL (ref 1.7–7.7)
Neutrophils Relative %: 72 %
PLATELETS: 319 10*3/uL (ref 150–400)
RBC: 3.79 MIL/uL — AB (ref 3.87–5.11)
RDW: 17.3 % — AB (ref 11.5–15.5)
WBC: 7.6 10*3/uL (ref 4.0–10.5)

## 2018-02-07 LAB — COMPREHENSIVE METABOLIC PANEL
ALK PHOS: 94 U/L (ref 38–126)
ALT: 28 U/L (ref 0–44)
AST: 28 U/L (ref 15–41)
Albumin: 3 g/dL — ABNORMAL LOW (ref 3.5–5.0)
Anion gap: 6 (ref 5–15)
BUN: 14 mg/dL (ref 8–23)
CALCIUM: 8.3 mg/dL — AB (ref 8.9–10.3)
CO2: 20 mmol/L — ABNORMAL LOW (ref 22–32)
CREATININE: 1.37 mg/dL — AB (ref 0.44–1.00)
Chloride: 112 mmol/L — ABNORMAL HIGH (ref 98–111)
GFR calc non Af Amer: 38 mL/min — ABNORMAL LOW (ref >60)
GFR, EST AFRICAN AMERICAN: 43 mL/min — AB (ref >60)
GLUCOSE: 92 mg/dL (ref 70–99)
Potassium: 3.5 mmol/L (ref 3.5–5.1)
SODIUM: 138 mmol/L (ref 135–145)
Total Bilirubin: 0.5 mg/dL (ref 0.3–1.2)
Total Protein: 6.3 g/dL — ABNORMAL LOW (ref 6.5–8.1)

## 2018-02-07 LAB — BRAIN NATRIURETIC PEPTIDE: B Natriuretic Peptide: 134.5 pg/mL — ABNORMAL HIGH (ref 0.0–100.0)

## 2018-02-07 NOTE — ED Provider Notes (Signed)
[facility] EMERGENCY DEPARTMENT Provider Note   CSN: [id number] Arrival date & time: 02/07/18  1114     History   Chief Complaint No chief complaint on file.   HPI Amy [name] is a 72 y.o. female.  HPI Patient presents with palpitations and chest pain.  History of atrial flutter comes and goes.  States it started last night.  She is already on anticoagulation  With Eliquis.  Also chronic lung issues.  Chronic oxygen.  Sees the heart failure clinic.  Took her metoprolol last night and states felt a little better.  Previous ablation.  States that a few months ago she had a cardioversion while in the ER. Past Medical History:  Diagnosis Date  . Apical variant hypertrophic cardiomyopathy ([place])    Diagnosed by echo and ECG 11/13  . Atypical atrial flutter ([place])   . CAD (coronary artery disease)    a. LHC in 10/2011 demonstrated non-obstructive disease and an 80% lesion in a small D1 treated medically.   . CHF (congestive heart failure) ([place])   . Chronic diastolic heart failure ([place])   . CKD (chronic kidney disease), stage III ([place])   . COPD (chronic obstructive pulmonary disease) ([place])   . Dizziness    had PT for being off balance - in approx. 2012  . GERD (gastroesophageal reflux disease)   . Hyperlipidemia   . Hypertension   . Hypothyroid   . Lumbar disc disease   . Macular degeneration   . OSA (obstructive sleep apnea)   . Persistent atrial fibrillation       . Pulmonary hypertension ([place])    a. [place] 05/2013 showed mild PAH with normal PCWP and RA pressure, could be related to OSA and low oxygen saturation.   . Refusal of blood transfusions as patient is Jehovah's Witness   . Sinus bradycardia     Patient Active Problem List   Diagnosis Date Noted  . 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 GIB (gastrointestinal bleeding) 11/29/2017  . Congestive heart failure ([place])   . Chronic respiratory failure with hypoxia ([place]) 05/29/2017  . Paroxysmal  atrial fibrillation with rapid ventricular response ([place]) 10/01/2016  . PAF (paroxysmal atrial fibrillation) ([place]) 10/01/2016  . Dyspnea   . Pulmonary hypertension, primary ([place])   . Atrial flutter with rapid ventricular response ([place])   . Elevated troponin   . NSTEMI (non-ST elevated myocardial infarction) ([place]) 10/29/2014  . Chest pain 10/28/2014  . Atrial flutter with RVR 10/28/2014  . Morbid obesity due to excess calories ([place]) 07/19/2014  . Chronic anticoagulation 07/19/2014  . CAD (coronary artery disease) 07/19/2014  . Chronic renal insufficiency, stage III (moderate) ([place]) 07/19/2014  . Dyslipidemia 04/12/2014  . Hypoxia 04/25/2013  . OSA (obstructive sleep apnea) 04/13/2013  . Apical variant hypertrophic cardiomyopathy ([place]) 03/05/2012  . Chronic diastolic heart failure ([place])   . Persistent atrial fibrillation ([place])   . Sinus bradycardia   . Asthmatic bronchitis , chronic ([place]) 03/04/2012  . Hypothyroidism 03/04/2012  . Essential hypertension 03/04/2012  . DOE (dyspnea on exertion) 12/24/2010    Past Surgical History:  Procedure Laterality Date  . BIOPSY  12/03/2017   Procedure: BIOPSY;  Surgeon: Otis Brace, MD;  Location: [facility] END[facility];  Service: Gastroenterology;;  . CARDIAC CATHETERIZATION     normal coronary arteries  . CARDIAC CATHETERIZATION N/A 10/31/2014   Procedure: Left Heart Cath and Coronary Angiography;  Surgeon: Leonie Man, MD;  Location: [facility] CV LAB;  Service: Cardiovascular;  Laterality: N/A;  . CATARACT EXTRACTION Bilateral   . CESAREAN SECTION  1983  . COLONOSCOPY WITH PROPOFOL N/A 12/03/2017   Procedure: COLONOSCOPY WITH PROPOFOL;  Surgeon: Otis Brace, MD;  Location: [facility];  Service: Gastroenterology;  Laterality: N/A;  . ELECTROPHYSIOLOGIC STUDY N/A 04/24/2015   Procedure: Atrial Fibrillation Ablation;  Surgeon: [name], MD;  Location: [facility] CV LAB;  Service: Cardiovascular;  Laterality: N/A;  . FLEXIBLE  SIGMOIDOSCOPY N/A 11/30/2017   Procedure: FLEXIBLE SIGMOIDOSCOPY;  Surgeon: Otis Brace, MD;  Location: [facility];  Service: Gastroenterology;  Laterality: N/A;  . RIGHT HEART CATH N/A 06/25/2016   Procedure: Right Heart Cath;  Surgeon: Sherren Mocha, MD;  Location: [facility] CV LAB;  Service: Cardiovascular;  Laterality: N/A;  . RIGHT HEART CATH N/A 06/10/2017   Procedure: RIGHT HEART CATH;  Surgeon: Larey Dresser, MD;  Location: [facility] CV LAB;  Service: Cardiovascular;  Laterality: N/A;  . RIGHT HEART CATHETERIZATION N/A 06/01/2013   Procedure: RIGHT HEART CATH;  Surgeon: Larey Dresser, MD;  Location: [facility] CATH LAB;  Service: Cardiovascular;  Laterality: N/A;  . TEE WITHOUT CARDIOVERSION N/A 04/23/2015   Procedure: TRANSESOPHAGEAL ECHOCARDIOGRAM (TEE);  Surgeon: Josue Hector, MD;  Location[facility] ENDOSCOPY;  Service: Cardiovascular;  Laterality: N/A;     OB History   None      Home Medications    Prior to Admission medications   Medication Sig Start Date End Date Taking? Authorizing Provider  acetaminophen (TYLENOL) 325 MG tablet Take 2 tablets (650 mg total) by mouth every 4 (four) hours as needed for headache or mild pain. 10/02/16  Yes Kilroy, Doreene Burke, PA-C  apixaban (ELIQUIS) 5 MG TABS tablet Take 1 tablet (5 mg total) by mouth 2 (two) times daily. 12/08/17  Yes Regalado, Belkys A, MD  atorvastatin (LIPITOR) 80 MG tablet Take 1 tablet (80 mg total) by mouth daily with lunch. 02/08/15  Yes Sherran Needs, NP  Cholecalciferol (VITAMIN D) 2000 UNITS CAPS Take 2,000 Units by mouth daily.    Yes [provider]  colchicine 0.6 MG tablet Take 0.6 mg by mouth daily.    Yes [provider]  dronedarone (MULTAQ) 400 MG tablet Take 1 tablet (400 mg total) by mouth 2 (two) times daily with a meal. 12/08/17  Yes Regalado, Belkys A, MD  ezetimibe (ZETIA) 10 MG tablet Take 1 tablet (10 mg total) by mouth daily. Patient overdue for appt w/Dr Turner,must call and schedule  for further refills3rd/final attempt Patient taking differently: Take 10 mg by mouth daily.  12/18/17  Yes Sueanne Margarita, MD  ferrous sulfate 325 (65 FE) MG tablet Take 1 tablet (325 mg total) by mouth 2 (two) times daily with a meal. 12/08/17  Yes Regalado, Belkys A, MD  folic acid (FOLVITE) 1 MG tablet Take 1 tablet (1 mg total) by mouth daily. 12/08/17  Yes Regalado, Belkys A, MD  furosemide (LASIX) 20 MG tablet Take 2 tablets (40 mg total) by mouth daily. 01/01/18  Yes Larey Dresser, MD  ipratropium-albuterol (DUONEB) 0.5-2.5 (3) MG/3ML SOLN Take 3 mLs by nebulization 3 (three) times daily. DX: J44.9 Patient taking differently: Take 3 mLs by nebulization 3 (three) times daily. DX: J44.9 07/03/17  Yes Juanito Doom, MD  levothyroxine (SYNTHROID, LEVOTHROID) 75 MCG tablet Take 75 mcg by mouth daily before breakfast.    Yes [provider]  Macitentan (OPSUMIT) 10 MG TABS Take 1 tablet (10 mg total) by mouth daily. 06/29/17  Yes  Larey Dresser, MD  Multiple Vitamin (MULTIVITAMIN) capsule Take 1 capsule by mouth daily. chewable   Yes [provider]  pantoprazole (PROTONIX) 40 MG tablet Take 1 tablet (40 mg total) by mouth daily. 08/05/17  Yes Larey Dresser, MD  potassium chloride SA (K-DUR,KLOR-CON) 20 MEQ tablet Take 1 tablet (20 mEq total) by mouth daily. Patient taking differently: Take 10 mEq by mouth daily. Pt taking differently 1/2 tab of 20 mEq daily 01/01/18  Yes Larey Dresser, MD  Selexipag (UPTRAVI) 1600 MCG TABS Take 1,600 mcg by mouth 2 (two) times daily.    Yes [provider]  UNABLE TO FIND Med Name: CPAP  D[facility]   Yes [provider]  vitamin B-12 100 MCG tablet Take 1 tablet (100 mcg total) by mouth daily. 12/08/17  Yes Regalado, Belkys A, MD  umeclidinium-vilanterol (ANORO ELLIPTA) 62.5-25 MCG/INH AEPB Inhale 1 puff into the lungs daily. 02/05/18   Juanito Doom, MD    Family History Family History  Problem Relation Age of Onset    . Heart disease Sister        Good Pastures Syndrome  . Gout Father   . Lung cancer Father        smoked  . Breast cancer Paternal Aunt     Social History Social History   Tobacco Use  . Smoking status: Former Smoker    Packs/day: 0.50    Years: 44.00    Pack years: 22.00    Types: Cigarettes    Last attempt to quit: 04/07/2009    Years since quitting: 8.8  . Smokeless tobacco: Never Used  Substance Use Topics  . Alcohol use: No  . Drug use: No     Allergies   Amiodarone; Cellcept [mycophenolate mofetil]; and Zyban [bupropion]   Review of Systems Review of Systems  Constitutional: Negative for appetite change.  HENT: Negative for congestion.   Respiratory: Positive for shortness of breath.   Cardiovascular: Negative for chest pain.  Gastrointestinal: Negative for abdominal pain.  Genitourinary: Negative for flank pain.  Musculoskeletal: Negative for back pain.  Neurological: Negative for weakness.  Psychiatric/Behavioral: Negative for confusion.     Physical Exam Updated Vital Signs BP 100/68   Pulse 61   Temp 98.1 F (36.7 C) (Oral)   Resp (!) 26   SpO2 97%   Physical Exam  Constitutional: She appears well-developed.  HENT:  Head: Normocephalic.  Patient is wearing nasal cannula oxygen.  Eyes: Pupils are equal, round, and reactive to light.  Neck: Neck supple.  Cardiovascular:  irregular tachycardia  Pulmonary/Chest:  Mildly diffuse harsh breath sounds  Abdominal: There is no tenderness.  Musculoskeletal: She exhibits no tenderness.  Neurological: She is alert.  Skin: Skin is warm.     ED Treatments / Results  Labs (all labs ordered are listed, but only abnormal results are displayed) Labs Reviewed  TROPONIN I - Abnormal; Notable for the following components:      Result Value   Troponin I 0.68 (*)    All other components within normal limits  CBC WITH DIFFERENTIAL/PLATELET - Abnormal; Notable for the following components:   RBC 3.79  (*)    Hemoglobin 11.4 (*)    MCV 101.6 (*)    MCHC 29.6 (*)    RDW 17.3 (*)    All other components within normal limits  BRAIN NATRIURETIC PEPTIDE - Abnormal; Notable for the following components:   B Natriuretic Peptide 134.5 (*)    All other  components within normal limits  COMPREHENSIVE METABOLIC PANEL - Abnormal; Notable for the following components:   Chloride 112 (*)    CO2 20 (*)    Creatinine, Ser 1.37 (*)    Calcium 8.3 (*)    Total Protein 6.3 (*)    Albumin 3.0 (*)    GFR calc non Af Amer 38 (*)    GFR calc Af Amer 43 (*)    All other components within normal limits  TROPONIN I    EKG EKG Interpretation  Date/Time:  Sunday February 07 2018 11:32:47 EST Ventricular Rate:  135 PR Interval:    QRS Duration: 86 QT Interval:  329 QTC Calculation: 494 R Axis:   40 Text Interpretation:  Sinus tachycardia vs flutter with 2:1 Probable LVH with secondary repol abnrm ST depression, consider ischemia, diffuse lds Abnormal T, probable ischemia, lateral leads Confirmed by Davonna Belling [phone]) on 02/07/2018 11:51:28 AM   Radiology Dg Chest 2 View  Result Date: 02/07/2018 CLINICAL DATA:  Tachycardia. EXAM: CHEST - 2 VIEW COMPARISON:  Chest CT 01/15/2018 FINDINGS: Mildly enlarged cardiac silhouette. Calcific atherosclerotic disease of the aorta. Mediastinal contours appear intact. There is no evidence of focal airspace consolidation, pleural effusion or pneumothorax. Minimal atelectasis versus scarring in the right middle lobe and lingula. Osseous structures are without acute abnormality. Soft tissues are grossly normal. IMPRESSION: Mildly enlarged cardiac silhouette, stable. Calcific atherosclerotic disease of the aorta. Minimal atelectasis versus scarring in the right middle lobe and lingula. Electronically Signed   By: Fidela Salisbury M.D.   On: 02/07/2018 13:20    Procedures Procedures (including critical care time)  Medications Ordered in ED Medications - No data  to display   Initial Impression / Assessment and Plan / ED Course  I have reviewed the triage vital signs and the nursing notes.  Pertinent labs & imaging results that were available during my care of the patient were reviewed by me and considered in my medical decision making (see chart for details).     Patient with A. flutter with RVR.  Does have history of A. fib.  However now back in a sinus rhythm.  Did have chest pain initially.  Troponin done and mildly elevated.  Does have some chronically elevated troponins with this is little higher.  I think is likely demand ischemia.  Discussed with Dr. Debara Pickett.  Will repeat.  If stable or decreasing can be discharged home.  If increasing will discuss with him again.  Care turned over to Dr. Wilson Singer.  Final Clinical Impressions(s) / ED Diagnoses   Final diagnoses:  Atrial flutter with rapid ventricular response [facility])    ED Discharge Orders    None       Davonna Belling, MD 02/07/18 1544

## 2018-02-07 NOTE — ED Notes (Signed)
Dr. Wilson Singer aware of troponin.

## 2018-02-07 NOTE — ED Provider Notes (Signed)
Assumed care at change of shift from Dr Alvino Chapel. Repeat troponin up significantly from prior. Pt says she feels good now. She denies pain, dyspnea, palpitations. She is currently in sinus rhythm with rate in 60s. Will discuss with cardiology.   Discussed twice with cardiology fellow after 2nd and third troponins. Still thinks this is from demand ischemia. HR now fine. She continues to deny any further symptoms. She will be discharged with close cardiology FU. Emergent return precautions discussed.    Virgel Manifold, MD 02/11/18 1058

## 2018-02-07 NOTE — ED Notes (Signed)
Patient denies pain and is resting comfortably.  

## 2018-02-07 NOTE — Telephone Encounter (Signed)
The patient called the answering service after-hours today. Chart reviewed, apical hypertrophic cardiomyopathy, COPD with severely decreased DLCO, CAD, chronic diastolic CHF, PAF, CKD, anti-synthetase syndrome, pulm HTN. Recently came off Eliquis 10/29-10/30 at approval of CHF team for dental extraction. Overnight she noticed rapid HR spiking to 130 range, continuing to do so despite taking metoprolol - fluctuates and variable. No acute symptoms but with complex hx and inability to glean further info over the phone, I feel she is best suited to present to ER for further management especially since already on antiarrhythmic medication. She agrees and plans to proceed. Arnaldo Heffron PA-C

## 2018-02-07 NOTE — ED Triage Notes (Addendum)
Patient arrived via EMS from home; reports palpitations that started since 1.30am, patient took Metoprolol(unsure-maybe 18m), she felt better but then "it went back up. Patient shows this RN  a FitBit, she uses to monitor HR.  Denies chest pain, denies shortness of Breath, denies dizziness. At home 4Lit [place] Patient is A&O x4  Patient reports  "I have had cardiac ablasion and cardiac catheterization."

## 2018-02-07 NOTE — Telephone Encounter (Signed)
The patient called the answering service after-hours today again. Answering service page states she was letting us know she came to the ER and is awaiting workup. I tried to call back but got VM. LMOM to let her know we got her message and will await consult if needed.   PA-C

## 2018-02-07 NOTE — ED Notes (Signed)
Patient denies pain and is resting comfortably.

## 2018-02-07 NOTE — ED Notes (Signed)
Pt given extended education on returning for chest pain/shob. Pt verbalized understanding of d/c instructions and follow up with cards instructions.

## 2018-02-07 NOTE — ED Notes (Signed)
Patient transported to X-ray 

## 2018-02-08 DIAGNOSIS — I2510 Atherosclerotic heart disease of native coronary artery without angina pectoris: Secondary | ICD-10-CM | POA: Diagnosis not present

## 2018-02-08 DIAGNOSIS — J9611 Chronic respiratory failure with hypoxia: Secondary | ICD-10-CM | POA: Diagnosis not present

## 2018-02-08 DIAGNOSIS — K5791 Diverticulosis of intestine, part unspecified, without perforation or abscess with bleeding: Secondary | ICD-10-CM | POA: Diagnosis not present

## 2018-02-08 DIAGNOSIS — I272 Pulmonary hypertension, unspecified: Secondary | ICD-10-CM | POA: Diagnosis not present

## 2018-02-08 DIAGNOSIS — I130 Hypertensive heart and chronic kidney disease with heart failure and stage 1 through stage 4 chronic kidney disease, or unspecified chronic kidney disease: Secondary | ICD-10-CM | POA: Diagnosis not present

## 2018-02-08 DIAGNOSIS — D8989 Other specified disorders involving the immune mechanism, not elsewhere classified: Secondary | ICD-10-CM | POA: Diagnosis not present

## 2018-02-09 ENCOUNTER — Telehealth (HOSPITAL_COMMUNITY): Payer: Self-pay

## 2018-02-09 ENCOUNTER — Other Ambulatory Visit (HOSPITAL_COMMUNITY): Payer: Self-pay

## 2018-02-09 DIAGNOSIS — K5791 Diverticulosis of intestine, part unspecified, without perforation or abscess with bleeding: Secondary | ICD-10-CM | POA: Diagnosis not present

## 2018-02-09 DIAGNOSIS — I272 Pulmonary hypertension, unspecified: Secondary | ICD-10-CM | POA: Diagnosis not present

## 2018-02-09 DIAGNOSIS — I130 Hypertensive heart and chronic kidney disease with heart failure and stage 1 through stage 4 chronic kidney disease, or unspecified chronic kidney disease: Secondary | ICD-10-CM | POA: Diagnosis not present

## 2018-02-09 DIAGNOSIS — I2510 Atherosclerotic heart disease of native coronary artery without angina pectoris: Secondary | ICD-10-CM | POA: Diagnosis not present

## 2018-02-09 DIAGNOSIS — D8989 Other specified disorders involving the immune mechanism, not elsewhere classified: Secondary | ICD-10-CM | POA: Diagnosis not present

## 2018-02-09 DIAGNOSIS — J9611 Chronic respiratory failure with hypoxia: Secondary | ICD-10-CM | POA: Diagnosis not present

## 2018-02-09 MED ORDER — METOPROLOL SUCCINATE ER 25 MG PO TB24: 25.0000 mg | ORAL_TABLET | Freq: Every day | ORAL | 3 refills | Status: DC

## 2018-02-09 NOTE — Telephone Encounter (Signed)
Kyle from [facility] and pt has called to state that pt's bp today is 90/68 and her HR is 125-140 bpm. Pt states that she just got out of hospital on 11/03. Please advise.

## 2018-02-09 NOTE — Telephone Encounter (Signed)
Pt notified. Rx sent to pharmacy. Pt states that she has been seen at the Afib clinic with Butch Penny before so I advised her to make an appt with them.

## 2018-02-09 NOTE — Telephone Encounter (Signed)
Thanks. Thanks for sending in Toprol.

## 2018-02-09 NOTE — Telephone Encounter (Signed)
Does she feel badly? Are her weights trending up? She was in ER 11/3 and was in aflutter 130s, but it looks like she converted spontaneously.

## 2018-02-10 ENCOUNTER — Telehealth: Payer: Self-pay | Admitting: Pharmacist

## 2018-02-10 ENCOUNTER — Other Ambulatory Visit: Payer: Self-pay | Admitting: Cardiology

## 2018-02-10 ENCOUNTER — Encounter (HOSPITAL_COMMUNITY): Payer: Self-pay | Admitting: Nurse Practitioner

## 2018-02-10 ENCOUNTER — Ambulatory Visit (HOSPITAL_COMMUNITY)
Admission: RE | Admit: 2018-02-10 | Discharge: 2018-02-10 | Disposition: A | Discharge status: Home / Self Care | Payer: Medicare Other | Source: Ambulatory Visit | Attending: Nurse Practitioner | Admitting: Nurse Practitioner

## 2018-02-10 VITALS — BP 132/64 | HR 78 | Ht 61.5 in | Wt 183.0 lb

## 2018-02-10 DIAGNOSIS — I130 Hypertensive heart and chronic kidney disease with heart failure and stage 1 through stage 4 chronic kidney disease, or unspecified chronic kidney disease: Secondary | ICD-10-CM | POA: Diagnosis not present

## 2018-02-10 DIAGNOSIS — E785 Hyperlipidemia, unspecified: Secondary | ICD-10-CM | POA: Insufficient documentation

## 2018-02-10 DIAGNOSIS — Z79890 Hormone replacement therapy (postmenopausal): Secondary | ICD-10-CM | POA: Diagnosis not present

## 2018-02-10 DIAGNOSIS — I2510 Atherosclerotic heart disease of native coronary artery without angina pectoris: Secondary | ICD-10-CM | POA: Insufficient documentation

## 2018-02-10 DIAGNOSIS — Z9889 Other specified postprocedural states: Secondary | ICD-10-CM | POA: Diagnosis not present

## 2018-02-10 DIAGNOSIS — Z7901 Long term (current) use of anticoagulants: Secondary | ICD-10-CM | POA: Diagnosis not present

## 2018-02-10 DIAGNOSIS — Z87891 Personal history of nicotine dependence: Secondary | ICD-10-CM | POA: Diagnosis not present

## 2018-02-10 DIAGNOSIS — Z888 Allergy status to other drugs, medicaments and biological substances status: Secondary | ICD-10-CM | POA: Diagnosis not present

## 2018-02-10 DIAGNOSIS — I422 Other hypertrophic cardiomyopathy: Secondary | ICD-10-CM | POA: Insufficient documentation

## 2018-02-10 DIAGNOSIS — G4733 Obstructive sleep apnea (adult) (pediatric): Secondary | ICD-10-CM | POA: Insufficient documentation

## 2018-02-10 DIAGNOSIS — I272 Pulmonary hypertension, unspecified: Secondary | ICD-10-CM | POA: Insufficient documentation

## 2018-02-10 DIAGNOSIS — I480 Paroxysmal atrial fibrillation: Secondary | ICD-10-CM | POA: Diagnosis not present

## 2018-02-10 DIAGNOSIS — I5032 Chronic diastolic (congestive) heart failure: Secondary | ICD-10-CM | POA: Diagnosis not present

## 2018-02-10 DIAGNOSIS — J449 Chronic obstructive pulmonary disease, unspecified: Secondary | ICD-10-CM | POA: Diagnosis not present

## 2018-02-10 DIAGNOSIS — N183 Chronic kidney disease, stage 3 (moderate): Secondary | ICD-10-CM | POA: Insufficient documentation

## 2018-02-10 DIAGNOSIS — Z8249 Family history of ischemic heart disease and other diseases of the circulatory system: Secondary | ICD-10-CM | POA: Insufficient documentation

## 2018-02-10 DIAGNOSIS — K219 Gastro-esophageal reflux disease without esophagitis: Secondary | ICD-10-CM | POA: Diagnosis not present

## 2018-02-10 DIAGNOSIS — I491 Atrial premature depolarization: Secondary | ICD-10-CM | POA: Insufficient documentation

## 2018-02-10 DIAGNOSIS — Z79899 Other long term (current) drug therapy: Secondary | ICD-10-CM | POA: Diagnosis not present

## 2018-02-10 DIAGNOSIS — R9431 Abnormal electrocardiogram [ECG] [EKG]: Secondary | ICD-10-CM | POA: Diagnosis not present

## 2018-02-10 DIAGNOSIS — I4891 Unspecified atrial fibrillation: Secondary | ICD-10-CM | POA: Diagnosis present

## 2018-02-10 MED ORDER — METOPROLOL TARTRATE 25 MG PO TABS: ORAL_TABLET | ORAL | 1 refills | Status: DC

## 2018-02-10 NOTE — Telephone Encounter (Signed)
Medication list reviewed in anticipation of upcoming Tikosyn initiation. Patient is currently taking dronedarone per the medications list and this will need to be discontinued 3 days prior to admission. She is not any other contraindicated or QTc prolonging medications. Potassium on most recent panel was low.   Patient is anticoagulated on Eliquis on the appropriate dose. Please ensure that patient has not missed any anticoagulation doses in the 3 weeks prior to Tikosyn initiation.   Patient will need to be counseled to avoid use of Benadryl while on Tikosyn and in the 2-3 days prior to Tikosyn initiation.

## 2018-02-11 ENCOUNTER — Other Ambulatory Visit (HOSPITAL_COMMUNITY): Payer: Self-pay | Admitting: *Deleted

## 2018-02-11 DIAGNOSIS — I272 Pulmonary hypertension, unspecified: Secondary | ICD-10-CM | POA: Diagnosis not present

## 2018-02-11 DIAGNOSIS — K5791 Diverticulosis of intestine, part unspecified, without perforation or abscess with bleeding: Secondary | ICD-10-CM | POA: Diagnosis not present

## 2018-02-11 DIAGNOSIS — J9611 Chronic respiratory failure with hypoxia: Secondary | ICD-10-CM | POA: Diagnosis not present

## 2018-02-11 DIAGNOSIS — D8989 Other specified disorders involving the immune mechanism, not elsewhere classified: Secondary | ICD-10-CM | POA: Diagnosis not present

## 2018-02-11 DIAGNOSIS — I2510 Atherosclerotic heart disease of native coronary artery without angina pectoris: Secondary | ICD-10-CM | POA: Diagnosis not present

## 2018-02-11 DIAGNOSIS — I130 Hypertensive heart and chronic kidney disease with heart failure and stage 1 through stage 4 chronic kidney disease, or unspecified chronic kidney disease: Secondary | ICD-10-CM | POA: Diagnosis not present

## 2018-02-11 MED ORDER — DRONEDARONE HCL 400 MG PO TABS: 400.0000 mg | ORAL_TABLET | Freq: Two times a day (BID) | ORAL | 3 refills | Status: DC

## 2018-02-11 NOTE — Addendum Note (Signed)
Encounter addended by: Sherran Needs, NP on: 02/11/2018 8:25 AM  Actions taken: Sign clinical note

## 2018-02-11 NOTE — Telephone Encounter (Signed)
This is a A-Fib clinic pt.

## 2018-02-11 NOTE — Progress Notes (Addendum)
Primary Care Physician: Aretta Nip, MD Primary Cardiologist: Aundra Dubin Primary Electrophysiologist: Klein/Allred   Amy [name] is a 72 y.o. female with a history of HOCM, CHF, pulmonary HTN,  persistent atrial fibrillation who presents for follow up of ER visit for breakthrough a fib, spontaneously converting in the ER.  She underwent AF ablation by Dr Rayann Heman in 2017 and has done well maintained on multaq. She has had a few episodes of afib including cardioversion in the ER in July. 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  She is not taking Metoprolol daily (takes just as needed for recurrent AF episodes) as her BP runs around 90 systolic at home. She will take an needed for afib. She has chronic shortness of breath with exertion.She has been started on several new drugs for her pulmonary issues, one of these of the meds, cellcept, caused a GI bleed and was stopped. Per prior notes by Dr. Rayann Heman she is not a candidate for Tikosyn or sotalol for long qtc issues in the past and is not the best repeat ablation candidate.She is in SR today. Continues on eliquis .    Today denies symptoms of chest pain, orthopnea, PND, presyncope, syncope, snoring, daytime somnolence, bleeding, or neurologic sequela. The patient is tolerating medications without difficulties and is otherwise without complaint today.    Past Medical History:  Diagnosis Date  . 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 Apical variant hypertrophic cardiomyopathy ([place])    Diagnosed by echo and ECG 11/13  . Atypical atrial flutter ([place])   . CAD (coronary artery disease)    a. LHC in 10/2011 demonstrated non-obstructive disease and an 80% lesion in a small D1 treated medically.   . CHF (congestive heart failure) ([place])   . Chronic diastolic heart failure ([place])   . CKD (chronic kidney disease), stage III ([place])   . COPD (chronic  obstructive pulmonary disease) ([place])   . Dizziness    had PT for being off balance - in approx. 2012  . GERD (gastroesophageal reflux disease)   . Hyperlipidemia   . Hypertension   . Hypothyroid   . Lumbar disc disease   . Macular degeneration   . OSA (obstructive sleep apnea)   . Persistent atrial fibrillation       . Pulmonary hypertension ([place])    a. [place] 05/2013 showed mild PAH with normal PCWP and RA pressure, could be related to OSA and low oxygen saturation.   . Refusal of blood transfusions as patient is Jehovah's Witness   . Sinus bradycardia    Past Surgical History:  Procedure Laterality Date  . BIOPSY  12/03/2017   Procedure: BIOPSY;  Surgeon: Otis Brace, MD;  [facility];  Service: Gastroenterology;;  . CARDIAC CATHETERIZATION     normal coronary arteries  . CARDIAC CATHETERIZATION N/A 10/31/2014   Procedure: Left Heart Cath and Coronary Angiography;  Surgeon: Leonie Man, MD;  Location: [facility];  Service: Cardiovascular;  Laterality: N/A;  . CATARACT EXTRACTION Bilateral   . CESAREAN SECTION  1983  . COLONOSCOPY WITH PROPOFOL N/A 12/03/2017   Procedure: COLONOSCOPY WITH PROPOFOL;  Surgeon: Otis Brace, MD;  Location: [facility];  Service: Gastroenterology;  Laterality: N/A;  . ELECTROPHYSIOLOGIC STUDY N/A 04/24/2015   Procedure: Atrial Fibrillation Ablation;  Surgeon: [name], MD;  Location: [facility];  Service: Cardiovascular;  Laterality: N/A;  . FLEXIBLE SIGMOIDOSCOPY N/A  11/30/2017   Procedure: FLEXIBLE SIGMOIDOSCOPY;  Surgeon: Otis Brace, MD;  Location: [facility];  Service: Gastroenterology;  Laterality: N/A;  . RIGHT HEART CATH N/A 06/25/2016   Procedure: Right Heart Cath;  Surgeon: Sherren Mocha, MD;  Location: [facility];  Service: Cardiovascular;  Laterality: N/A;  . RIGHT HEART CATH N/A 06/10/2017   Procedure: RIGHT HEART CATH;  Surgeon: Larey Dresser, MD;  Location: [facility];   Service: Cardiovascular;  Laterality: N/A;  . RIGHT HEART CATHETERIZATION N/A 06/01/2013   Procedure: RIGHT HEART CATH;  Surgeon: Larey Dresser, MD;  Location: [facility] CATH LAB;  Service: Cardiovascular;  Laterality: N/A;  . TEE WITHOUT CARDIOVERSION N/A 04/23/2015   Procedure: TRANSESOPHAGEAL ECHOCARDIOGRAM (TEE);  Surgeon: Josue Hector, MD;  Location: [facility] ENDOSCOPY;  Service: Cardiovascular;  Laterality: N/A;    Current Outpatient Medications  Medication Sig Dispense Refill  . acetaminophen (TYLENOL) 325 MG tablet Take 2 tablets (650 mg total) by mouth every 4 (four) hours as needed for headache or mild pain.    Marland Kitchen apixaban (ELIQUIS) 5 MG TABS tablet Take 1 tablet (5 mg total) by mouth 2 (two) times daily. 60 tablet 1  . atorvastatin (LIPITOR) 80 MG tablet Take 1 tablet (80 mg total) by mouth daily with lunch. 90 tablet 3  . Cholecalciferol (VITAMIN D) 2000 UNITS CAPS Take 2,000 Units by mouth daily.     . colchicine 0.6 MG tablet Take 0.6 mg by mouth daily.     Marland Kitchen dronedarone (MULTAQ) 400 MG tablet Take 1 tablet (400 mg total) by mouth 2 (two) times daily with a meal. 60 tablet 0  . ezetimibe (ZETIA) 10 MG tablet Take 1 tablet (10 mg total) by mouth daily. Patient overdue for appt w/Dr Turner,must call and schedule for further refills3rd/final attempt (Patient taking differently: Take 10 mg by mouth daily. ) 15 tablet 0  . ferrous sulfate 325 (65 FE) MG tablet Take 1 tablet (325 mg total) by mouth 2 (two) times daily with a meal. 60 tablet 3  . folic acid (FOLVITE) 1 MG tablet Take 1 tablet (1 mg total) by mouth daily. 30 tablet 0  . furosemide (LASIX) 20 MG tablet Take 2 tablets (40 mg total) by mouth daily. 60 tablet 6  . ipratropium-albuterol (DUONEB) 0.5-2.5 (3) MG/3ML SOLN Take 3 mLs by nebulization 3 (three) times daily. DX: J44.9 (Patient taking differently: Take 3 mLs by nebulization 3 (three) times daily. DX: J44.9) 360 mL 5  . levothyroxine (SYNTHROID, LEVOTHROID) 75 MCG tablet Take 75 mcg  by mouth daily before breakfast.     . Macitentan (OPSUMIT) 10 MG TABS Take 1 tablet (10 mg total) by mouth daily. 30 tablet 11  . Multiple Vitamin (MULTIVITAMIN) capsule Take 1 capsule by mouth daily. chewable    . pantoprazole (PROTONIX) 40 MG tablet Take 1 tablet (40 mg total) by mouth daily. 90 tablet 2  . potassium chloride SA (K-DUR,KLOR-CON) 20 MEQ tablet Take 1 tablet (20 mEq total) by mouth daily. (Patient taking differently: Take 10 mEq by mouth daily. Pt taking differently 1/2 tab of 20 mEq daily) 30 tablet 6  . Selexipag (UPTRAVI) 1600 MCG TABS Take 1,600 mcg by mouth 2 (two) times daily.     Marland Kitchen umeclidinium-vilanterol (ANORO ELLIPTA) 62.5-25 MCG/INH AEPB Inhale 1 puff into the lungs daily. 1 each 0  . UNABLE TO FIND Med Name: CPAP  [facility]    . vitamin B-12 100 MCG tablet Take 1 tablet (100 mcg total)  by mouth daily. 30 tablet 0  . metoprolol tartrate (LOPRESSOR) 25 MG tablet Take 1/2-1 tablet by mouth every 6 hours AS NEEDED for breakthrough afib HR over 100 45 tablet 1   No current facility-administered medications for this encounter.     Allergies  Allergen Reactions  . Amiodarone     Dyspnea - felt to be 2/2 amio lung toxicity Has tolerated Omnipaque   . Cellcept [Mycophenolate Mofetil] Other (See Comments)    Gastric Bleeding  . Zyban [Bupropion] Itching    Social History   Socioeconomic History  . Marital status: Widowed    Spouse name: Not on file  . Number of children: 2  . Years of education: Not on file  . Highest education level: Not on file  Occupational History  . Occupation: retired. still works for [employer].   Social Needs  . Financial resource strain: Not hard at all  . Food insecurity:    Worry: Never true    Inability: Never true  . Transportation needs:    Medical: No    Non-medical: No  Tobacco Use  . Smoking status: Former Smoker    Packs/day: 0.50    Years: 44.00    Pack years: 22.00    Types: Cigarettes    Last attempt to quit: 04/07/2009     Years since quitting: 8.8  . Smokeless tobacco: Never Used  Substance and Sexual Activity  . Alcohol use: No  . Drug use: No  . Sexual activity: Never  Lifestyle  . Physical activity:    Days per week: Not on file    Minutes per session: Not on file  . Stress: Not on file  Relationships  . Social connections:    Talks on phone: Not on file    Gets together: Not on file    Attends religious service: Not on file    Active member of club or organization: Not on file    Attends meetings of clubs or organizations: Not on file    Relationship status: Not on file  . Intimate partner violence:    Fear of current or ex partner: Not on file    Emotionally abused: Not on file    Physically abused: Not on file    Forced sexual activity: Not on file  Other Topics Concern  . Not on file  Social History Narrative   Pt lives alone with 2 dogs.     Family History  Problem Relation Age of Onset  . Heart disease Sister        Good Pastures Syndrome  . Gout Father   . Lung cancer Father        smoked  . Breast cancer Paternal Aunt    The patient does not have a history of early familial atrial fibrillation or other arrhythmias.  ROS- All systems are reviewed and negative except as per the HPI above.  Physical Exam: Vitals:   02/10/18 1105  BP: 132/64  Pulse: 78  Weight: 83 kg  Height: 5' 1.5" (1.562 m)    GEN- The patient is chronically ill appearing, alert and oriented x 3 today.   Head- normocephalic, atraumatic Eyes-  Sclera clear, conjunctiva pink Ears- hearing intact Oropharynx- clear Neck- supple  Lungs- Clear to ausculation bilaterally, normal work of breathing Heart- Regular rate and rhythm  GI- soft, NT, ND, + BS Extremities- no clubbing, cyanosis, or edema MS- no significant deformity or atrophy Skin- no rash or lesion Psych- euthymic mood, full affect  Neuro- strength and sensation are intact  Wt Readings from Last 3 Encounters:  02/10/18 83 kg  02/05/18  83.5 kg  01/29/18 84.5 kg    EKG today demonstrates sinus rhythm at 78 bpm, LVH, qtc at 460 ms  Epic records are reviewed at length today  Assessment and Plan:  1. Paroxsymal  atrial fibrillation The patient has symptomatic atrial fibrillation. She is status post AF ablation in 2017. She had been doing well without recurrence until recently.   Continue Multaq and prn metoprolol. We discussed that she could take low dose scheduled metoprolol (12.12m ) for breakthrough afib but not daily as she runs a low BP at home.  Unfortunately, her options are limited   Discussed with Dr. ARayann Hemanand I will get her an appointment to discuss further with him I am having her current drug list evaluated by PharmD with her newer lung drugs just in case Tikosyn may be an option when she sees Dr. ARayann HemanOn Multaq , her qt is stable at 460 ms  2. COPD/pulmonary hypertension/anti-synthetase syndrome Per Dr McLean/Dr. MLake Bells  Follow up with Dr ARayann Hemanin 1-2 weeks  DRoderic Palau NP 02/11/2018 8:10 AM

## 2018-02-15 DIAGNOSIS — K5791 Diverticulosis of intestine, part unspecified, without perforation or abscess with bleeding: Secondary | ICD-10-CM | POA: Diagnosis not present

## 2018-02-15 DIAGNOSIS — D8989 Other specified disorders involving the immune mechanism, not elsewhere classified: Secondary | ICD-10-CM | POA: Diagnosis not present

## 2018-02-15 DIAGNOSIS — J9611 Chronic respiratory failure with hypoxia: Secondary | ICD-10-CM | POA: Diagnosis not present

## 2018-02-15 DIAGNOSIS — I130 Hypertensive heart and chronic kidney disease with heart failure and stage 1 through stage 4 chronic kidney disease, or unspecified chronic kidney disease: Secondary | ICD-10-CM | POA: Diagnosis not present

## 2018-02-15 DIAGNOSIS — I2510 Atherosclerotic heart disease of native coronary artery without angina pectoris: Secondary | ICD-10-CM | POA: Diagnosis not present

## 2018-02-15 DIAGNOSIS — I272 Pulmonary hypertension, unspecified: Secondary | ICD-10-CM | POA: Diagnosis not present

## 2018-02-17 ENCOUNTER — Telehealth (HOSPITAL_COMMUNITY): Payer: Self-pay

## 2018-02-17 DIAGNOSIS — D8989 Other specified disorders involving the immune mechanism, not elsewhere classified: Secondary | ICD-10-CM | POA: Diagnosis not present

## 2018-02-17 DIAGNOSIS — I2510 Atherosclerotic heart disease of native coronary artery without angina pectoris: Secondary | ICD-10-CM | POA: Diagnosis not present

## 2018-02-17 DIAGNOSIS — I272 Pulmonary hypertension, unspecified: Secondary | ICD-10-CM | POA: Diagnosis not present

## 2018-02-17 DIAGNOSIS — K5791 Diverticulosis of intestine, part unspecified, without perforation or abscess with bleeding: Secondary | ICD-10-CM | POA: Diagnosis not present

## 2018-02-17 DIAGNOSIS — I130 Hypertensive heart and chronic kidney disease with heart failure and stage 1 through stage 4 chronic kidney disease, or unspecified chronic kidney disease: Secondary | ICD-10-CM | POA: Diagnosis not present

## 2018-02-17 DIAGNOSIS — J9611 Chronic respiratory failure with hypoxia: Secondary | ICD-10-CM | POA: Diagnosis not present

## 2018-02-17 NOTE — Telephone Encounter (Signed)
Kyle from PT called and stated pt's bp is 90/60. Marylyn Ishihara also states that he can not perform PT on bp that low. Please advise.

## 2018-02-17 NOTE — Telephone Encounter (Signed)
Please call back and obtain more information. .   For now continue current medications unless she is dizzy.       NP-C  3:37 PM

## 2018-02-17 NOTE — Telephone Encounter (Signed)
Left VM for pt to call clinic.

## 2018-02-18 NOTE — Telephone Encounter (Signed)
Spoke with pt and she denies any dizziness. Pt states that she has had diarrhea and anorexia since she was hospitalized in Sept. Pt has been drinking ensure.

## 2018-02-22 DIAGNOSIS — D8989 Other specified disorders involving the immune mechanism, not elsewhere classified: Secondary | ICD-10-CM | POA: Diagnosis not present

## 2018-02-22 DIAGNOSIS — I2510 Atherosclerotic heart disease of native coronary artery without angina pectoris: Secondary | ICD-10-CM | POA: Diagnosis not present

## 2018-02-22 DIAGNOSIS — K5791 Diverticulosis of intestine, part unspecified, without perforation or abscess with bleeding: Secondary | ICD-10-CM | POA: Diagnosis not present

## 2018-02-22 DIAGNOSIS — I272 Pulmonary hypertension, unspecified: Secondary | ICD-10-CM | POA: Diagnosis not present

## 2018-02-22 DIAGNOSIS — J9611 Chronic respiratory failure with hypoxia: Secondary | ICD-10-CM | POA: Diagnosis not present

## 2018-02-22 DIAGNOSIS — I130 Hypertensive heart and chronic kidney disease with heart failure and stage 1 through stage 4 chronic kidney disease, or unspecified chronic kidney disease: Secondary | ICD-10-CM | POA: Diagnosis not present

## 2018-02-23 DIAGNOSIS — J9611 Chronic respiratory failure with hypoxia: Secondary | ICD-10-CM | POA: Diagnosis not present

## 2018-02-23 DIAGNOSIS — D8989 Other specified disorders involving the immune mechanism, not elsewhere classified: Secondary | ICD-10-CM | POA: Diagnosis not present

## 2018-02-23 DIAGNOSIS — I130 Hypertensive heart and chronic kidney disease with heart failure and stage 1 through stage 4 chronic kidney disease, or unspecified chronic kidney disease: Secondary | ICD-10-CM | POA: Diagnosis not present

## 2018-02-23 DIAGNOSIS — I272 Pulmonary hypertension, unspecified: Secondary | ICD-10-CM | POA: Diagnosis not present

## 2018-02-23 DIAGNOSIS — K5791 Diverticulosis of intestine, part unspecified, without perforation or abscess with bleeding: Secondary | ICD-10-CM | POA: Diagnosis not present

## 2018-02-23 DIAGNOSIS — I2510 Atherosclerotic heart disease of native coronary artery without angina pectoris: Secondary | ICD-10-CM | POA: Diagnosis not present

## 2018-02-24 ENCOUNTER — Encounter: Payer: Self-pay | Admitting: Internal Medicine

## 2018-02-24 ENCOUNTER — Ambulatory Visit (INDEPENDENT_AMBULATORY_CARE_PROVIDER_SITE_OTHER): Payer: Medicare Other | Admitting: Internal Medicine

## 2018-02-24 VITALS — BP 98/60 | HR 73 | Ht 61.5 in | Wt 181.4 lb

## 2018-02-24 DIAGNOSIS — I10 Essential (primary) hypertension: Secondary | ICD-10-CM | POA: Diagnosis not present

## 2018-02-24 DIAGNOSIS — I270 Primary pulmonary hypertension: Secondary | ICD-10-CM | POA: Diagnosis not present

## 2018-02-24 DIAGNOSIS — I480 Paroxysmal atrial fibrillation: Secondary | ICD-10-CM

## 2018-02-24 DIAGNOSIS — J849 Interstitial pulmonary disease, unspecified: Secondary | ICD-10-CM | POA: Diagnosis not present

## 2018-02-24 DIAGNOSIS — I2510 Atherosclerotic heart disease of native coronary artery without angina pectoris: Secondary | ICD-10-CM

## 2018-02-24 NOTE — Progress Notes (Signed)
PCP: Aretta Nip, MD Primary Cardiologist: Dr Aundra Dubin Primary EP: Dr Margret Chance is a 72 y.o. female who presents today for routine electrophysiology followup.  Since last being seen in our clinic, the patient reports doing reasonably well.  She has autoimmune lung disease and plans to see rheumatology at [facility] soon.  She has noticed increased AF burden since starting Anora by Dr Spero Curb.  She stopped this medicine about a week ago and is pleased that her afib has improved.  Today, she denies symptoms of chest pain, lower extremity edema, dizziness, presyncope, or syncope.  The patient is otherwise without complaint today.   Past Medical History:  Diagnosis Date  . 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 Apical variant hypertrophic cardiomyopathy ([place])    Diagnosed by echo and ECG 11/13  . Atypical atrial flutter ([place])   . CAD (coronary artery disease)    a. LHC in 10/2011 demonstrated non-obstructive disease and an 80% lesion in a small D1 treated medically.   . CHF (congestive heart failure) ([place])   . Chronic diastolic heart failure ([place])   . CKD (chronic kidney disease), stage III ([place])   . COPD (chronic obstructive pulmonary disease) ([place])   . Dizziness    had PT for being off balance - in approx. 2012  . GERD (gastroesophageal reflux disease)   . Hyperlipidemia   . Hypertension   . Hypothyroid   . Lumbar disc disease   . Macular degeneration   . OSA (obstructive sleep apnea)   . Persistent atrial fibrillation       . Pulmonary hypertension ([place])    a. [place] 05/2013 showed mild PAH with normal PCWP and RA pressure, could be related to OSA and low oxygen saturation.   . Refusal of blood transfusions as patient is Jehovah's Witness   . Sinus bradycardia    Past Surgical History:  Procedure Laterality Date  . BIOPSY  12/03/2017   Procedure: BIOPSY;  Surgeon: Otis Brace, MD;  [facility];  Service: Gastroenterology;;  . CARDIAC CATHETERIZATION       normal coronary arteries  . CARDIAC CATHETERIZATION N/A 10/31/2014   Procedure: Left Heart Cath and Coronary Angiography;  Surgeon: Leonie Man, MD;  Location: [place] [facility];  Service: Cardiovascular;  Laterality: N/A;  . CATARACT EXTRACTION Bilateral   . CESAREAN SECTION  1983  . COLONOSCOPY WITH PROPOFOL N/A 12/03/2017   Procedure: COLONOSCOPY WITH PROPOFOL;  Surgeon: Otis Brace, MD;  Location: [place];  Service: Gastroenterology;  Laterality: N/A;  . ELECTROPHYSIOLOGIC STUDY N/A 04/24/2015   Procedure: Atrial Fibrillation Ablation;  Surgeon: [name], MD;  Location: [place] [facility];  Service: Cardiovascular;  Laterality: N/A;  . FLEXIBLE SIGMOIDOSCOPY N/A 11/30/2017   Procedure: FLEXIBLE SIGMOIDOSCOPY;  Surgeon: Otis Brace, MD;  Location: [place];  Service: Gastroenterology;  Laterality: N/A;  . RIGHT HEART CATH N/A 06/25/2016   Procedure: Right Heart Cath;  Surgeon: Sherren Mocha, MD;  Location: [place] [facility];  Service: Cardiovascular;  Laterality: N/A;  . RIGHT HEART CATH N/A 06/10/2017   Procedure: RIGHT HEART CATH;  Surgeon: Larey Dresser, MD;  Location: [place] [facility];  Service: Cardiovascular;  Laterality: N/A;  . RIGHT HEART CATHETERIZATION N/A 06/01/2013   Procedure: RIGHT HEART CATH;  Surgeon: Larey Dresser, MD;  Location: [facility] CATH LAB;  Service: Cardiovascular;  Laterality: N/A;  . TEE WITHOUT CARDIOVERSION N/A 04/23/2015   Procedure: TRANSESOPHAGEAL ECHOCARDIOGRAM (TEE);  Surgeon: Josue Hector,  MD;  [facility];  Service: Cardiovascular;  Laterality: N/A;    ROS- all systems are reviewed and negatives except as per HPI above  Current Outpatient Medications  Medication Sig Dispense Refill  . acetaminophen (TYLENOL) 325 MG tablet Take 2 tablets (650 mg total) by mouth every 4 (four) hours as needed for headache or mild pain.    Marland Kitchen apixaban (ELIQUIS) 5 MG TABS tablet Take 1 tablet (5 mg total) by mouth 2 (two) times  daily. 60 tablet 1  . atorvastatin (LIPITOR) 80 MG tablet Take 1 tablet (80 mg total) by mouth daily with lunch. 90 tablet 3  . Cholecalciferol (VITAMIN D) 2000 UNITS CAPS Take 2,000 Units by mouth daily.     . colchicine 0.6 MG tablet Take 0.6 mg by mouth daily.     Marland Kitchen dronedarone (MULTAQ) 400 MG tablet Take 1 tablet (400 mg total) by mouth 2 (two) times daily with a meal. 60 tablet 3  . ezetimibe (ZETIA) 10 MG tablet Take 1 tablet (10 mg total) by mouth daily. Patient overdue for appt w/Dr Turner,must call and schedule for further refills3rd/final attempt (Patient taking differently: Take 10 mg by mouth daily. ) 15 tablet 0  . ferrous sulfate 325 (65 FE) MG tablet Take 1 tablet (325 mg total) by mouth 2 (two) times daily with a meal. 60 tablet 3  . folic acid (FOLVITE) 1 MG tablet Take 1 tablet (1 mg total) by mouth daily. 30 tablet 0  . furosemide (LASIX) 20 MG tablet Take 2 tablets (40 mg total) by mouth daily. 60 tablet 6  . ipratropium-albuterol (DUONEB) 0.5-2.5 (3) MG/3ML SOLN Take 3 mLs by nebulization 3 (three) times daily. DX: J44.9 (Patient taking differently: Take 3 mLs by nebulization 3 (three) times daily. DX: J44.9) 360 mL 5  . levothyroxine (SYNTHROID, LEVOTHROID) 75 MCG tablet Take 75 mcg by mouth daily before breakfast.     . Macitentan (OPSUMIT) 10 MG TABS Take 1 tablet (10 mg total) by mouth daily. 30 tablet 11  . metoprolol tartrate (LOPRESSOR) 25 MG tablet Take 1/2-1 tablet by mouth every 6 hours AS NEEDED for breakthrough afib HR over 100 45 tablet 1  . Multiple Vitamin (MULTIVITAMIN) capsule Take 1 capsule by mouth daily. chewable    . nitroGLYCERIN (NITROSTAT) 0.4 MG SL tablet PLACE 1 TABLET (0.4 MG TOTAL) UNDER THE TONGUE EVERY 5 (FIVE) MINUTES AS NEEDED FOR CHEST PAIN. 25 tablet 4  . pantoprazole (PROTONIX) 40 MG tablet Take 1 tablet (40 mg total) by mouth daily. 90 tablet 2  . potassium chloride SA (K-DUR,KLOR-CON) 20 MEQ tablet Take 1 tablet (20 mEq total) by mouth daily.  (Patient taking differently: Take 10 mEq by mouth daily. Pt taking differently 1/2 tab of 20 mEq daily) 30 tablet 6  . Selexipag (UPTRAVI) 1600 MCG TABS Take 1,600 mcg by mouth 2 (two) times daily.     Marland Kitchen umeclidinium-vilanterol (ANORO ELLIPTA) 62.5-25 MCG/INH AEPB Inhale 1 puff into the lungs daily. 1 each 0  . UNABLE TO FIND Med Name: CPAP  D[facility]    . vitamin B-12 100 MCG tablet Take 1 tablet (100 mcg total) by mouth daily. 30 tablet 0   No current facility-administered medications for this visit.     Physical Exam: Vitals:   02/24/18 1040  BP: 98/60  Pulse: 73  SpO2: 99%  Weight: 181 lb 6.4 oz (82.3 kg)  Height: 5' 1.5" (1.562 m)    GEN- The patient is well appearing, alert and oriented  x 3 today.   Head- normocephalic, atraumatic Eyes-  Sclera clear, conjunctiva pink Ears- hearing intact Oropharynx- clear Lungs- Clear to ausculation bilaterally, normal work of breathing Heart- Regular rate and rhythm, no murmurs, rubs or gallops, PMI not laterally displaced GI- soft, NT, ND, + BS Extremities- no clubbing, cyanosis, or edema  Wt Readings from Last 3 Encounters:  02/24/18 181 lb 6.4 oz (82.3 kg)  02/10/18 183 lb (83 kg)  02/05/18 184 lb (83.5 kg)    EKG tracing ordered today is personally reviewed and shows sinus rhythm 73 bpm, PR 160 msec, QRS 90 msec, QTc 469  Assessment and Plan:  1. Persistent atrial fibrillation/ atypical atrial flutter She has severe LA enlargement and pulmonary htn.  She has done surprisingly well for quite some time.  She does not have additional AAD options.  I would also not advise ablation.  I think that she is doing as well as could possibly be expected She thinks that Anora prescribed for breathing increased her afib.  She is pleased that since stopping this medicine, her afib has improved. On eliquis currently  2. Overweight Body mass index is 33.72 kg/m.  3. HTN Stable No change required today  4. Pulmonary hypertension    Followed by Dr Aundra Dubin  followup with Dr Aundra Dubin as scheduled I will see as needed  [name] MD, [facility] 02/24/2018 10:52 AM

## 2018-02-24 NOTE — Patient Instructions (Addendum)
Medication Instructions:  Your physician recommends that you continue on your current medications as directed. Please refer to the Current Medication list given to you today.  Labwork: None ordered.  Testing/Procedures: None ordered.  Follow-Up: Your physician wants you to follow-up in: as needed with Dr. Rayann Heman.       Any Other Special Instructions Will Be Listed Below (If Applicable).  If you need a refill on your cardiac medications before your next appointment, please call your pharmacy.

## 2018-02-25 DIAGNOSIS — I5032 Chronic diastolic (congestive) heart failure: Secondary | ICD-10-CM | POA: Diagnosis not present

## 2018-02-25 DIAGNOSIS — J449 Chronic obstructive pulmonary disease, unspecified: Secondary | ICD-10-CM | POA: Diagnosis not present

## 2018-02-25 DIAGNOSIS — I272 Pulmonary hypertension, unspecified: Secondary | ICD-10-CM | POA: Diagnosis not present

## 2018-02-25 DIAGNOSIS — Z9981 Dependence on supplemental oxygen: Secondary | ICD-10-CM | POA: Diagnosis not present

## 2018-02-25 DIAGNOSIS — D8989 Other specified disorders involving the immune mechanism, not elsewhere classified: Secondary | ICD-10-CM | POA: Diagnosis not present

## 2018-02-25 DIAGNOSIS — I422 Other hypertrophic cardiomyopathy: Secondary | ICD-10-CM | POA: Diagnosis not present

## 2018-02-25 DIAGNOSIS — J9611 Chronic respiratory failure with hypoxia: Secondary | ICD-10-CM | POA: Diagnosis not present

## 2018-02-25 DIAGNOSIS — Z87891 Personal history of nicotine dependence: Secondary | ICD-10-CM | POA: Diagnosis not present

## 2018-02-25 DIAGNOSIS — I130 Hypertensive heart and chronic kidney disease with heart failure and stage 1 through stage 4 chronic kidney disease, or unspecified chronic kidney disease: Secondary | ICD-10-CM | POA: Diagnosis not present

## 2018-02-25 DIAGNOSIS — Z7901 Long term (current) use of anticoagulants: Secondary | ICD-10-CM | POA: Diagnosis not present

## 2018-02-25 DIAGNOSIS — N183 Chronic kidney disease, stage 3 (moderate): Secondary | ICD-10-CM | POA: Diagnosis not present

## 2018-02-25 DIAGNOSIS — I2510 Atherosclerotic heart disease of native coronary artery without angina pectoris: Secondary | ICD-10-CM | POA: Diagnosis not present

## 2018-02-25 DIAGNOSIS — K5791 Diverticulosis of intestine, part unspecified, without perforation or abscess with bleeding: Secondary | ICD-10-CM | POA: Diagnosis not present

## 2018-03-02 DIAGNOSIS — I130 Hypertensive heart and chronic kidney disease with heart failure and stage 1 through stage 4 chronic kidney disease, or unspecified chronic kidney disease: Secondary | ICD-10-CM | POA: Diagnosis not present

## 2018-03-02 DIAGNOSIS — I272 Pulmonary hypertension, unspecified: Secondary | ICD-10-CM | POA: Diagnosis not present

## 2018-03-02 DIAGNOSIS — I422 Other hypertrophic cardiomyopathy: Secondary | ICD-10-CM | POA: Diagnosis not present

## 2018-03-02 DIAGNOSIS — I5032 Chronic diastolic (congestive) heart failure: Secondary | ICD-10-CM | POA: Diagnosis not present

## 2018-03-02 DIAGNOSIS — I2510 Atherosclerotic heart disease of native coronary artery without angina pectoris: Secondary | ICD-10-CM | POA: Diagnosis not present

## 2018-03-02 DIAGNOSIS — N183 Chronic kidney disease, stage 3 (moderate): Secondary | ICD-10-CM | POA: Diagnosis not present

## 2018-03-08 ENCOUNTER — Other Ambulatory Visit: Payer: Self-pay

## 2018-03-08 NOTE — Telephone Encounter (Signed)
No refill for prednisone is needed at this time. Nothing further needed

## 2018-03-09 DIAGNOSIS — I272 Pulmonary hypertension, unspecified: Secondary | ICD-10-CM | POA: Diagnosis not present

## 2018-03-09 DIAGNOSIS — I2510 Atherosclerotic heart disease of native coronary artery without angina pectoris: Secondary | ICD-10-CM | POA: Diagnosis not present

## 2018-03-09 DIAGNOSIS — I422 Other hypertrophic cardiomyopathy: Secondary | ICD-10-CM | POA: Diagnosis not present

## 2018-03-09 DIAGNOSIS — N183 Chronic kidney disease, stage 3 (moderate): Secondary | ICD-10-CM | POA: Diagnosis not present

## 2018-03-09 DIAGNOSIS — I5032 Chronic diastolic (congestive) heart failure: Secondary | ICD-10-CM | POA: Diagnosis not present

## 2018-03-09 DIAGNOSIS — I130 Hypertensive heart and chronic kidney disease with heart failure and stage 1 through stage 4 chronic kidney disease, or unspecified chronic kidney disease: Secondary | ICD-10-CM | POA: Diagnosis not present

## 2018-03-17 DIAGNOSIS — I5032 Chronic diastolic (congestive) heart failure: Secondary | ICD-10-CM | POA: Diagnosis not present

## 2018-03-17 DIAGNOSIS — I2510 Atherosclerotic heart disease of native coronary artery without angina pectoris: Secondary | ICD-10-CM | POA: Diagnosis not present

## 2018-03-17 DIAGNOSIS — N183 Chronic kidney disease, stage 3 (moderate): Secondary | ICD-10-CM | POA: Diagnosis not present

## 2018-03-17 DIAGNOSIS — I422 Other hypertrophic cardiomyopathy: Secondary | ICD-10-CM | POA: Diagnosis not present

## 2018-03-17 DIAGNOSIS — I272 Pulmonary hypertension, unspecified: Secondary | ICD-10-CM | POA: Diagnosis not present

## 2018-03-17 DIAGNOSIS — I130 Hypertensive heart and chronic kidney disease with heart failure and stage 1 through stage 4 chronic kidney disease, or unspecified chronic kidney disease: Secondary | ICD-10-CM | POA: Diagnosis not present

## 2018-03-22 DIAGNOSIS — N183 Chronic kidney disease, stage 3 (moderate): Secondary | ICD-10-CM | POA: Diagnosis not present

## 2018-03-22 DIAGNOSIS — I422 Other hypertrophic cardiomyopathy: Secondary | ICD-10-CM | POA: Diagnosis not present

## 2018-03-22 DIAGNOSIS — I130 Hypertensive heart and chronic kidney disease with heart failure and stage 1 through stage 4 chronic kidney disease, or unspecified chronic kidney disease: Secondary | ICD-10-CM | POA: Diagnosis not present

## 2018-03-22 DIAGNOSIS — I272 Pulmonary hypertension, unspecified: Secondary | ICD-10-CM | POA: Diagnosis not present

## 2018-03-22 DIAGNOSIS — I5032 Chronic diastolic (congestive) heart failure: Secondary | ICD-10-CM | POA: Diagnosis not present

## 2018-03-22 DIAGNOSIS — I2510 Atherosclerotic heart disease of native coronary artery without angina pectoris: Secondary | ICD-10-CM | POA: Diagnosis not present

## 2018-03-23 DIAGNOSIS — D500 Iron deficiency anemia secondary to blood loss (chronic): Secondary | ICD-10-CM | POA: Diagnosis not present

## 2018-03-29 DIAGNOSIS — F0631 Mood disorder due to known physiological condition with depressive features: Secondary | ICD-10-CM | POA: Diagnosis not present

## 2018-04-08 DIAGNOSIS — I130 Hypertensive heart and chronic kidney disease with heart failure and stage 1 through stage 4 chronic kidney disease, or unspecified chronic kidney disease: Secondary | ICD-10-CM | POA: Diagnosis not present

## 2018-04-08 DIAGNOSIS — I272 Pulmonary hypertension, unspecified: Secondary | ICD-10-CM | POA: Diagnosis not present

## 2018-04-08 DIAGNOSIS — I422 Other hypertrophic cardiomyopathy: Secondary | ICD-10-CM | POA: Diagnosis not present

## 2018-04-08 DIAGNOSIS — N183 Chronic kidney disease, stage 3 (moderate): Secondary | ICD-10-CM | POA: Diagnosis not present

## 2018-04-08 DIAGNOSIS — I2510 Atherosclerotic heart disease of native coronary artery without angina pectoris: Secondary | ICD-10-CM | POA: Diagnosis not present

## 2018-04-08 DIAGNOSIS — I5032 Chronic diastolic (congestive) heart failure: Secondary | ICD-10-CM | POA: Diagnosis not present

## 2018-04-09 ENCOUNTER — Encounter (HOSPITAL_COMMUNITY): Payer: Self-pay | Admitting: Cardiology

## 2018-04-09 ENCOUNTER — Ambulatory Visit (HOSPITAL_COMMUNITY)
Admission: RE | Admit: 2018-04-09 | Discharge: 2018-04-09 | Disposition: A | Discharge status: Home / Self Care | Payer: Medicare Other | Source: Ambulatory Visit | Attending: Cardiology | Admitting: Cardiology

## 2018-04-09 VITALS — BP 112/60 | HR 82 | Wt 180.6 lb

## 2018-04-09 DIAGNOSIS — I5032 Chronic diastolic (congestive) heart failure: Secondary | ICD-10-CM | POA: Insufficient documentation

## 2018-04-09 DIAGNOSIS — I509 Heart failure, unspecified: Secondary | ICD-10-CM

## 2018-04-09 DIAGNOSIS — I270 Primary pulmonary hypertension: Secondary | ICD-10-CM | POA: Diagnosis not present

## 2018-04-09 DIAGNOSIS — I2721 Secondary pulmonary arterial hypertension: Secondary | ICD-10-CM | POA: Insufficient documentation

## 2018-04-09 DIAGNOSIS — K219 Gastro-esophageal reflux disease without esophagitis: Secondary | ICD-10-CM | POA: Diagnosis not present

## 2018-04-09 DIAGNOSIS — Z79890 Hormone replacement therapy (postmenopausal): Secondary | ICD-10-CM | POA: Insufficient documentation

## 2018-04-09 DIAGNOSIS — Z87891 Personal history of nicotine dependence: Secondary | ICD-10-CM | POA: Diagnosis not present

## 2018-04-09 DIAGNOSIS — I422 Other hypertrophic cardiomyopathy: Secondary | ICD-10-CM | POA: Diagnosis not present

## 2018-04-09 DIAGNOSIS — Z79899 Other long term (current) drug therapy: Secondary | ICD-10-CM | POA: Diagnosis not present

## 2018-04-09 DIAGNOSIS — Z7901 Long term (current) use of anticoagulants: Secondary | ICD-10-CM | POA: Insufficient documentation

## 2018-04-09 DIAGNOSIS — J849 Interstitial pulmonary disease, unspecified: Secondary | ICD-10-CM

## 2018-04-09 DIAGNOSIS — J449 Chronic obstructive pulmonary disease, unspecified: Secondary | ICD-10-CM | POA: Diagnosis not present

## 2018-04-09 DIAGNOSIS — Z8249 Family history of ischemic heart disease and other diseases of the circulatory system: Secondary | ICD-10-CM | POA: Insufficient documentation

## 2018-04-09 DIAGNOSIS — G4733 Obstructive sleep apnea (adult) (pediatric): Secondary | ICD-10-CM | POA: Insufficient documentation

## 2018-04-09 DIAGNOSIS — E785 Hyperlipidemia, unspecified: Secondary | ICD-10-CM | POA: Diagnosis not present

## 2018-04-09 DIAGNOSIS — D8989 Other specified disorders involving the immune mechanism, not elsewhere classified: Secondary | ICD-10-CM | POA: Diagnosis not present

## 2018-04-09 DIAGNOSIS — E039 Hypothyroidism, unspecified: Secondary | ICD-10-CM | POA: Insufficient documentation

## 2018-04-09 DIAGNOSIS — I2510 Atherosclerotic heart disease of native coronary artery without angina pectoris: Secondary | ICD-10-CM | POA: Diagnosis not present

## 2018-04-09 DIAGNOSIS — I480 Paroxysmal atrial fibrillation: Secondary | ICD-10-CM | POA: Insufficient documentation

## 2018-04-09 DIAGNOSIS — F0631 Mood disorder due to known physiological condition with depressive features: Secondary | ICD-10-CM | POA: Diagnosis not present

## 2018-04-09 LAB — COMPREHENSIVE METABOLIC PANEL
ALBUMIN: 3.4 g/dL — AB (ref 3.5–5.0)
ALT: 30 U/L (ref 0–44)
ANION GAP: 5 (ref 5–15)
AST: 24 U/L (ref 15–41)
Alkaline Phosphatase: 105 U/L (ref 38–126)
BUN: 15 mg/dL (ref 8–23)
CHLORIDE: 113 mmol/L — AB (ref 98–111)
CO2: 22 mmol/L (ref 22–32)
Calcium: 9.1 mg/dL (ref 8.9–10.3)
Creatinine, Ser: 1.18 mg/dL — ABNORMAL HIGH (ref 0.44–1.00)
GFR calc non Af Amer: 46 mL/min — ABNORMAL LOW (ref >60)
GFR, EST AFRICAN AMERICAN: 53 mL/min — AB (ref >60)
GLUCOSE: 97 mg/dL (ref 70–99)
POTASSIUM: 4.1 mmol/L (ref 3.5–5.1)
SODIUM: 140 mmol/L (ref 135–145)
Total Bilirubin: 0.3 mg/dL (ref 0.3–1.2)
Total Protein: 7.1 g/dL (ref 6.5–8.1)

## 2018-04-09 NOTE — Patient Instructions (Signed)
Today we attempted a 6 minute walk.   Labs were drawn.  EKG done.  Your physician recommends that you schedule a follow-up appointment in 2 months with Dr Aundra Dubin.

## 2018-04-11 NOTE — Progress Notes (Signed)
PCP: Dr. Radene Ou Cardiology: Dr. Radford Pax HF Cardiology: Dr. Aundra Dubin Pulmonary: Dr. Lake Bells  73 y.o. with history of COPD, OSA on CPAP, apical hypertrophic cardiomyopathy, chronic diastolic CHF, and paroxysmal atrial fibrillation returns for followup of CHF and pulmonary hypertension.  Patient was admitted in 3/18 with CHF and dyspnea, found to have pulmonary arterial hypertension by RHC.  Prior to this, she has had a long history of PAF.  She is currently taking dronedarone but has had breakthrough fibrillation.  She is in NSR today.  She had an atrial fibrillation ablation in the past. She has apical hypertrophic cardiomyopathy proven by cardiac MRI with a consistent ECG.   She is on Opsumit and has started Selexipag, now titrated up to 1600 mcg bid. She was unable to tolerate Adcirca. Symptomatically, she has not seen significant improvement with selective pulmonary vasodilators to treat PAH.     I repeated her RHC in 3/19.  This showed lower PA pressure and normal right and left heart filling pressures, but more worrisomely, cardiac output was low.  However, on repeat of echo in 3/19, the RV actually looked normal in size and systolic function.  Given concern for possible PVOD as cause of her worsening symptoms on treatment, I ordered a high resolution CT chest.  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  I had her see Dr Lake Bells. He sent autoimmune serologies again, weak P-ANCA positivity so she was referred to rheumatology.  Echo bubble study was negative.  CCP and Ro-52 were positive.  Also of note, she has had atrial fibrillation ablation and pulmonary vein stenosis is a consideration.  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  She was started on prednisone and Cellcept.  Cellcept was stopped after recent GI  bleed.   In 9/19, she was admitted with a GI bleed.  Colonoscopy did not show a definite cause.  She was started back on Eliquis.   Patient is in NSR today.  Weight is down 6 lbs.  She is short of breath after walking about 100-200 feet, this is stable.  No dyspnea walking around her house and doing basic chores.  She is taking Lasix every other day.   Labs (2/18): TSH normal Labs (3/18): K 3.8, creatinine 1.26, BNP 424 Labs (5/18): K 4.4, creatinine 1.96, anti-SCL 70 negative, RF very slightly elevated, HIV negative.  Labs (6/18): K 4, creatinine 1.2, LDL 49 Labs (9/18): BNP 149, K 4, creatinine 1.4, BNP 149 Labs (2/19): BNP 126, K 3.8, creatinine 1.5, CCP+, Ro-52+, elevated CK.  Labs (7/19): BNP 676, K 4.7, creatinine 1.74 Labs (9/19): K 3.8, creatinine 1.39, BNP 220 Labs (11/19): K 3.5, creatinine 1.37, BNP 134  ECG (personally reviewed): NSR, inferolateral T wave inversions  4/18 6 minute walk: 109 m  10/18 6 minute walk: 229 m 2/19 6 minute walk: < 100 m 5/19 6 minute walk: 107 m 1/20 6 minute walk: 188 m  PMH:  1. COPD: Quit smoking 2011.  - PFTs (3/18): minimal obstruction, minimal restriction, DLCO 22% (severely decreased).  2. Hypothyroidism 3. Hyperlipidemia 4. OSA: Uses CPAP 5. Apical hypertrophic cardiomyopathy:  - Cardiac MRI (7/16) with EF 79%, mid-apical LV hypertrophy with spade-like ventricle, mid-myocardial LGE was noted at the apex.   6. GERD 7. Atrial fibrillation: Paroxysmal.  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 Long QT interval so dofetilide and sotalol have not been used. She  has had atrial fibrillation ablation.  - Currently on dronedarone.  - DCCV to NSR in ER 7/19.  8. Coronary artery disease: LHC (7/16) with nonobstructive disease + 85% ostial stenosis small D1.  9. Diastolic CHF: Echo (9/47) with EF 65-70%, apical hypertrophic cardiomyopathy, severe LAE, PASP 86 mmHg.  - RHC (3/18): mean RA 4, PA 69/23 mean 38, mean PCWP 8, CI 2.17, PVR 7.3 WU Fick,  9.6 WU thermo.  - Echo (3/19): EF 65-70%, apical hypertrophic cardiomyopathy, normal RV size and systolic function, severe LAE.  - Echo (6/19): EF 65-70%, apical HCM, restrictive diastolic dysfunction, negative bubble study, PASP 42 mmHg.  10. Pulmonary hypertension: See RHC above.  - V/Q scan negative 3/18 - HIV, SCL70 negative.  RF borderline elevated, doubt significant.  ANA negative.  - Unable to tolerate Adcirca - RHC (3/19): mean RA 6, PA 49/16 mean 29, mean PCWP 10, CI 1.9/PVR 5.3 WU Fick, CI 1.4/PVR 7.3 Thermo.  - High resolution CT chest (3/19): respiratory motion artifact present, but suspect mild emphysema with lungs grossly clear.  - P-ANCA+, CCP+, RO-52+, CK elevated.  - Echo 6/19 with negative bubble study.  11. CPX (12/17): peak VO2 8.5, RER 0.96, VE/VCO2 slope 67. Submaximal but appears to show severe functional impairment.   12. CKD: Stage 3.  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   Social History   Socioeconomic History  . Marital status: Widowed    Spouse name: Not on file  . Number of children: 2  . Years of education: Not on file  . Highest education level: Not on file  Occupational History  . Occupation: retired. still works for [employer].   Social Needs  . Financial resource strain: Not hard at all  . Food insecurity:    Worry: Never true    Inability: Never true  . Transportation needs:    Medical: No    Non-medical: No  Tobacco Use  . Smoking status: Former Smoker    Packs/day: 0.50    Years: 44.00    Pack years: 22.00    Types: Cigarettes    Last attempt to quit: 04/07/2009    Years since quitting: 9.0  . Smokeless tobacco: Never Used  Substance and Sexual Activity  . Alcohol use: No  . Drug use: No  . Sexual activity: Never  Lifestyle  . Physical activity:    Days per week: Not on file    Minutes per session: Not on file  . Stress: Not on file  Relationships   . Social connections:    Talks on phone: Not on file    Gets together: Not on file    Attends religious service: Not on file    Active member of club or organization: Not on file    Attends meetings of clubs or organizations: Not on file    Relationship status: Not on file  . Intimate partner violence:    Fear of current or ex partner: Not on file    Emotionally abused: Not on file    Physically abused: Not on file    Forced sexual activity: Not on file  Other Topics Concern  . Not on file  Social History Narrative   Pt lives alone with 2 dogs.    Family History  Problem Relation Age of Onset  . Heart disease Sister        Good Pastures Syndrome  . Gout Father   . Lung cancer Father  smoked  . Breast cancer Paternal Aunt    Review of systems complete and found to be negative unless listed in HPI.    Current Outpatient Medications  Medication Sig Dispense Refill  . acetaminophen (TYLENOL) 325 MG tablet Take 2 tablets (650 mg total) by mouth every 4 (four) hours as needed for headache or mild pain.    Marland Kitchen apixaban (ELIQUIS) 5 MG TABS tablet Take 1 tablet (5 mg total) by mouth 2 (two) times daily. 60 tablet 1  . atorvastatin (LIPITOR) 80 MG tablet Take 1 tablet (80 mg total) by mouth daily with lunch. 90 tablet 3  . Cholecalciferol (VITAMIN D) 2000 UNITS CAPS Take 2,000 Units by mouth daily.     . colchicine 0.6 MG tablet Take 0.6 mg by mouth daily.     Marland Kitchen dronedarone (MULTAQ) 400 MG tablet Take 1 tablet (400 mg total) by mouth 2 (two) times daily with a meal. 60 tablet 3  . ezetimibe (ZETIA) 10 MG tablet Take 1 tablet (10 mg total) by mouth daily. Patient overdue for appt w/Dr Turner,must call and schedule for further refills3rd/final attempt (Patient taking differently: Take 10 mg by mouth daily. ) 15 tablet 0  . ferrous sulfate 325 (65 FE) MG tablet Take 1 tablet (325 mg total) by mouth 2 (two) times daily with a meal. 60 tablet 3  . folic acid (FOLVITE) 1 MG tablet Take 1  tablet (1 mg total) by mouth daily. 30 tablet 0  . furosemide (LASIX) 20 MG tablet Take 2 tablets (40 mg total) by mouth daily. 60 tablet 6  . ipratropium-albuterol (DUONEB) 0.5-2.5 (3) MG/3ML SOLN Take 3 mLs by nebulization 3 (three) times daily. DX: J44.9 (Patient taking differently: Take 3 mLs by nebulization 3 (three) times daily. DX: J44.9) 360 mL 5  . levothyroxine (SYNTHROID, LEVOTHROID) 75 MCG tablet Take 75 mcg by mouth daily before breakfast.     . Macitentan (OPSUMIT) 10 MG TABS Take 1 tablet (10 mg total) by mouth daily. 30 tablet 11  . metoprolol tartrate (LOPRESSOR) 25 MG tablet Take 1/2-1 tablet by mouth every 6 hours AS NEEDED for breakthrough afib HR over 100 45 tablet 1  . Multiple Vitamin (MULTIVITAMIN) capsule Take 1 capsule by mouth daily. chewable    . pantoprazole (PROTONIX) 40 MG tablet Take 1 tablet (40 mg total) by mouth daily. 90 tablet 2  . potassium chloride SA (K-DUR,KLOR-CON) 20 MEQ tablet Take 1 tablet (20 mEq total) by mouth daily. (Patient taking differently: Take 10 mEq by mouth daily. Pt taking differently 1/2 tab of 20 mEq daily) 30 tablet 6  . Selexipag (UPTRAVI) 1600 MCG TABS Take 1,600 mcg by mouth 2 (two) times daily.     Marland Kitchen umeclidinium-vilanterol (ANORO ELLIPTA) 62.5-25 MCG/INH AEPB Inhale 1 puff into the lungs daily. 1 each 0  . UNABLE TO FIND Med Name: CPAP  [facility]    . vitamin B-12 100 MCG tablet Take 1 tablet (100 mcg total) by mouth daily. 30 tablet 0  . nitroGLYCERIN (NITROSTAT) 0.4 MG SL tablet PLACE 1 TABLET (0.4 MG TOTAL) UNDER THE TONGUE EVERY 5 (FIVE) MINUTES AS NEEDED FOR CHEST PAIN. (Patient not taking: Reported on 04/09/2018) 25 tablet 4   No current facility-administered medications for this encounter.    BP 112/60   Pulse 82   Wt 81.9 kg (180 lb 9.6 oz)   SpO2 92% Comment: 5L pulse setting, or 4 L cont.  BMI 33.57 kg/m   General: NAD Neck: No JVD, no  thyromegaly or thyroid nodule.  Lungs: Slight crackles at bases bilaterally.  CV:  Nondisplaced PMI.  Heart regular S1/S2, no S3/S4, no murmur.  No peripheral edema.  No carotid bruit.  Normal pedal pulses.  Abdomen: Soft, nontender, no hepatosplenomegaly, no distention.  Skin: Intact without lesions or rashes.  Neurologic: Alert and oriented x 3.  Psych: Normal affect. Extremities: No clubbing or cyanosis.  HEENT: Normal.   Assessment/Plan: 1. Chronic diastolic CHF: Echo 3/54 with EF 65-70%, normal RV size and systolic function.  Given worsening symptoms and the finding of low cardiac output on RHC, I had expected her RV to look hypokinetic. However, on 6/19 echo, the RV looked fairly normal. Today on exam, she does not appear volume overloaded.  Chronic NYHA class III symptoms.  Weight is down. - She can continue Lasix as she has been taking it, every other day.  - BMET today.  2. Apical hypertrophic cardiomyopathy: Cardiac MRI in 7/16 showed mid-apical LV hypertrophy with vigorous contraction. Now off metoprolol with slow HR (uses metoprolol prn tachycardia - atrial fibrillation).  ECG is consistent with apical HCM.   3. Atrial fibrillation:  Remains in NSR today on Multaq.  Per Dr Rayann Heman, would not re-do her ablation. Breakthrough atrial fibrillation in 7/19, DCCV back to NSR in ER. NSR today.   - Continue Eliquis.  No further GI bleeding.     - Continue dronedarone.  She does have CHF/volume overload but will continue dronedarone for now unless CHF becomes difficult to manage. She is not a candidate for redo ablation and does not have other anti-arrhythmic options per Dr. Rayann Heman.  4. Pulmonary hypertension: RHC in 3/18 showed moderate pulmonary arterial hypertension with elevated PVR (7.3 WU by Fick, 9.6 WU by thermo). She has COPD but I do not think this plays a large role in the pulmonary hypertension =>  PFTs in 3/18 showed minimal obstruction and restriction, there was significantly decreased DLCO, this may be due to pulmonary vascular disease.  OSA may play a role but not  a large one. V/Q scan did not suggest chronic PEs in 3/18.  RF borderline elevated (probably not significant), HIV negative, Anti-SCL70 negative, ANA negative, P-ANCA weakly positive.  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  Her RHC in 3/19 showed lower PA pressure and PVR with normal right and left heart filling pressures, but cardiac output was lower than prior.  Surprisingly, her RV looked normal on echo so I question how accurate the cardiac output measurement really was.  Bubble study negative on echo.  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 6 minute walk improved today.  - Also of note, she has a history of afib ablation so pulmonary vein stenosis certainly remains a concern for raising PA pressure but think this is unlikely as the cause for her presentation.   - Continue Opsumit.     - She did not tolerate Adcirca.   - Continue Selexipag 1600 bid.  5. Anti-synthetase syndrome: Elevated CK, positive myositis panel, nonspecific interstitial pneumonia.  - She has now stopped Cellcept since episode of GI bleeding.  - She has an appointment at [facility] for evaluation of anti-synthetase syndrome.   Followup in 2 months.  [name], MD  04/11/2018

## 2018-04-14 DIAGNOSIS — I480 Paroxysmal atrial fibrillation: Secondary | ICD-10-CM | POA: Diagnosis not present

## 2018-04-14 DIAGNOSIS — Z862 Personal history of diseases of the blood and blood-forming organs and certain disorders involving the immune mechanism: Secondary | ICD-10-CM | POA: Diagnosis not present

## 2018-04-14 DIAGNOSIS — Z8719 Personal history of other diseases of the digestive system: Secondary | ICD-10-CM | POA: Diagnosis not present

## 2018-04-14 DIAGNOSIS — R197 Diarrhea, unspecified: Secondary | ICD-10-CM | POA: Diagnosis not present

## 2018-04-14 DIAGNOSIS — R1011 Right upper quadrant pain: Secondary | ICD-10-CM | POA: Diagnosis not present

## 2018-04-19 DIAGNOSIS — R197 Diarrhea, unspecified: Secondary | ICD-10-CM | POA: Diagnosis not present

## 2018-04-22 DIAGNOSIS — I270 Primary pulmonary hypertension: Secondary | ICD-10-CM | POA: Insufficient documentation

## 2018-04-22 DIAGNOSIS — Z79899 Other long term (current) drug therapy: Secondary | ICD-10-CM | POA: Diagnosis not present

## 2018-04-22 DIAGNOSIS — R768 Other specified abnormal immunological findings in serum: Secondary | ICD-10-CM | POA: Diagnosis not present

## 2018-04-22 DIAGNOSIS — D8989 Other specified disorders involving the immune mechanism, not elsewhere classified: Secondary | ICD-10-CM | POA: Diagnosis not present

## 2018-04-22 DIAGNOSIS — M1A00X0 Idiopathic chronic gout, unspecified site, without tophus (tophi): Secondary | ICD-10-CM | POA: Diagnosis not present

## 2018-04-28 DIAGNOSIS — M545 Low back pain: Secondary | ICD-10-CM | POA: Diagnosis not present

## 2018-04-28 DIAGNOSIS — F0631 Mood disorder due to known physiological condition with depressive features: Secondary | ICD-10-CM | POA: Diagnosis not present

## 2018-04-30 DIAGNOSIS — M47817 Spondylosis without myelopathy or radiculopathy, lumbosacral region: Secondary | ICD-10-CM | POA: Diagnosis not present

## 2018-04-30 DIAGNOSIS — M545 Low back pain: Secondary | ICD-10-CM | POA: Diagnosis not present

## 2018-04-30 DIAGNOSIS — M549 Dorsalgia, unspecified: Secondary | ICD-10-CM | POA: Diagnosis not present

## 2018-05-03 DIAGNOSIS — I509 Heart failure, unspecified: Secondary | ICD-10-CM | POA: Diagnosis not present

## 2018-05-03 DIAGNOSIS — I272 Pulmonary hypertension, unspecified: Secondary | ICD-10-CM | POA: Diagnosis not present

## 2018-05-03 DIAGNOSIS — I480 Paroxysmal atrial fibrillation: Secondary | ICD-10-CM | POA: Diagnosis not present

## 2018-05-03 DIAGNOSIS — F0631 Mood disorder due to known physiological condition with depressive features: Secondary | ICD-10-CM | POA: Diagnosis not present

## 2018-05-03 DIAGNOSIS — M545 Low back pain: Secondary | ICD-10-CM | POA: Diagnosis not present

## 2018-05-03 DIAGNOSIS — Z9981 Dependence on supplemental oxygen: Secondary | ICD-10-CM | POA: Diagnosis not present

## 2018-05-05 ENCOUNTER — Telehealth: Payer: Self-pay | Admitting: Pulmonary Disease

## 2018-05-05 DIAGNOSIS — J849 Interstitial pulmonary disease, unspecified: Secondary | ICD-10-CM

## 2018-05-05 NOTE — Telephone Encounter (Signed)
Yes that's fine

## 2018-05-05 NOTE — Telephone Encounter (Signed)
Pt calling to make BQ aware: Pt was seen by Dr. Gerlene Burdock at [facility], was prescribed AZATHIOPRINE 549m, 2 tabs daily.    Pt has not yet started this medicine, but will start on 05/10/2018.   Dr. CGerlene Burdockwants labs drawn 2-3 weeks after starting the medication-  CBC w diff, LFT, ALT, creatinine.    Routing to BQ as FYI.  Also, please advise if you're willing to order these labs so pt can have them drawn [facility]  Thanks!

## 2018-05-05 NOTE — Telephone Encounter (Signed)
Spoke with patient Advised labs were approved by Dr. Tally Due placed Advised patient labs to be done by our clinic within the next two weeks. Patient voiced understanding. Nothing further needed at this time.

## 2018-05-07 ENCOUNTER — Other Ambulatory Visit (HOSPITAL_COMMUNITY): Payer: Self-pay | Admitting: Nurse Practitioner

## 2018-05-12 DIAGNOSIS — F0631 Mood disorder due to known physiological condition with depressive features: Secondary | ICD-10-CM | POA: Diagnosis not present

## 2018-05-19 ENCOUNTER — Other Ambulatory Visit (INDEPENDENT_AMBULATORY_CARE_PROVIDER_SITE_OTHER): Payer: Medicare Other

## 2018-05-19 DIAGNOSIS — J849 Interstitial pulmonary disease, unspecified: Secondary | ICD-10-CM

## 2018-05-19 LAB — CBC WITH DIFFERENTIAL/PLATELET
BASOS ABS: 0 10*3/uL (ref 0.0–0.1)
Basophils Relative: 0.8 % (ref 0.0–3.0)
Eosinophils Absolute: 0.2 10*3/uL (ref 0.0–0.7)
Eosinophils Relative: 3.1 % (ref 0.0–5.0)
HEMATOCRIT: 36.7 % (ref 36.0–46.0)
HEMOGLOBIN: 11.5 g/dL — AB (ref 12.0–15.0)
LYMPHS PCT: 13.9 % (ref 12.0–46.0)
Lymphs Abs: 0.8 10*3/uL (ref 0.7–4.0)
MCHC: 31.3 g/dL (ref 30.0–36.0)
MCV: 93.7 fl (ref 78.0–100.0)
MONOS PCT: 9.1 % (ref 3.0–12.0)
Monocytes Absolute: 0.6 10*3/uL (ref 0.1–1.0)
Neutro Abs: 4.4 10*3/uL (ref 1.4–7.7)
Neutrophils Relative %: 73.1 % (ref 43.0–77.0)
Platelets: 294 10*3/uL (ref 150.0–400.0)
RBC: 3.91 Mil/uL (ref 3.87–5.11)
RDW: 18.4 % — ABNORMAL HIGH (ref 11.5–15.5)
WBC: 6.1 10*3/uL (ref 4.0–10.5)

## 2018-05-19 LAB — HEPATIC FUNCTION PANEL
ALT: 19 U/L (ref 0–35)
AST: 18 U/L (ref 0–37)
Albumin: 4.1 g/dL (ref 3.5–5.2)
Alkaline Phosphatase: 86 U/L (ref 39–117)
BILIRUBIN DIRECT: 0.1 mg/dL (ref 0.0–0.3)
TOTAL PROTEIN: 6.9 g/dL (ref 6.0–8.3)
Total Bilirubin: 0.5 mg/dL (ref 0.2–1.2)

## 2018-05-19 LAB — CREATININE, SERUM: CREATININE: 1.23 mg/dL — AB (ref 0.40–1.20)

## 2018-05-19 LAB — ALT: ALT: 19 U/L (ref 0–35)

## 2018-05-21 DIAGNOSIS — F0631 Mood disorder due to known physiological condition with depressive features: Secondary | ICD-10-CM | POA: Diagnosis not present

## 2018-05-26 DIAGNOSIS — F0631 Mood disorder due to known physiological condition with depressive features: Secondary | ICD-10-CM | POA: Diagnosis not present

## 2018-05-31 DIAGNOSIS — F0631 Mood disorder due to known physiological condition with depressive features: Secondary | ICD-10-CM | POA: Diagnosis not present

## 2018-06-03 ENCOUNTER — Telehealth: Payer: Self-pay | Admitting: Pulmonary Disease

## 2018-06-03 DIAGNOSIS — Z5181 Encounter for therapeutic drug level monitoring: Secondary | ICD-10-CM

## 2018-06-03 NOTE — Telephone Encounter (Signed)
Call made to patient, she states she is supposed to have lab work done routinely. She states BQ knows about this and what labs she is supposed to be getting. She states she is having the blood work done routinely for therapeutic monitoring for one of her medications.   Per phone note 05/05/18: Juanito Doom, MD  to [facility] Triage Pool    05/05/18 3:08 PM    Yes that's fine      05/05/18 12:16 PM  Len Blalock, CMA routed this conversation to [place],     Pt calling to make BQ aware: Pt was seen by Dr. Gerlene Burdock at [facility], was prescribed AZATHIOPRINE 554m, 2 tabs daily.    Pt has not yet started this medicine, but will start on 05/10/2018.   Dr. CGerlene Burdockwants labs drawn 2-3 weeks after starting the medication-  CBC w diff, LFT, ALT, creatinine.    Routing to BQ as FYI.  Also, please advise if you're willing to order these labs so pt can have them drawn [facility]  Thanks!      Lab orders have been placed. Voiced understanding. Nothing further needed at this time.

## 2018-06-07 ENCOUNTER — Other Ambulatory Visit (HOSPITAL_COMMUNITY): Payer: Self-pay

## 2018-06-07 MED ORDER — MACITENTAN 10 MG PO TABS: 10.0000 mg | ORAL_TABLET | Freq: Every day | ORAL | 11 refills | Status: DC

## 2018-06-08 ENCOUNTER — Ambulatory Visit (HOSPITAL_COMMUNITY)
Admission: RE | Admit: 2018-06-08 | Discharge: 2018-06-08 | Disposition: A | Discharge status: Home / Self Care | Payer: Medicare Other | Source: Ambulatory Visit | Attending: Cardiology | Admitting: Cardiology

## 2018-06-08 ENCOUNTER — Encounter (HOSPITAL_COMMUNITY): Payer: Self-pay | Admitting: Cardiology

## 2018-06-08 VITALS — BP 98/58 | HR 63 | Wt 183.4 lb

## 2018-06-08 DIAGNOSIS — J449 Chronic obstructive pulmonary disease, unspecified: Secondary | ICD-10-CM | POA: Diagnosis not present

## 2018-06-08 DIAGNOSIS — E039 Hypothyroidism, unspecified: Secondary | ICD-10-CM | POA: Diagnosis not present

## 2018-06-08 DIAGNOSIS — I480 Paroxysmal atrial fibrillation: Secondary | ICD-10-CM | POA: Diagnosis not present

## 2018-06-08 DIAGNOSIS — J849 Interstitial pulmonary disease, unspecified: Secondary | ICD-10-CM | POA: Diagnosis not present

## 2018-06-08 DIAGNOSIS — E785 Hyperlipidemia, unspecified: Secondary | ICD-10-CM | POA: Diagnosis not present

## 2018-06-08 DIAGNOSIS — I5032 Chronic diastolic (congestive) heart failure: Secondary | ICD-10-CM | POA: Insufficient documentation

## 2018-06-08 DIAGNOSIS — Z8249 Family history of ischemic heart disease and other diseases of the circulatory system: Secondary | ICD-10-CM | POA: Insufficient documentation

## 2018-06-08 DIAGNOSIS — I2510 Atherosclerotic heart disease of native coronary artery without angina pectoris: Secondary | ICD-10-CM | POA: Diagnosis not present

## 2018-06-08 DIAGNOSIS — Z79899 Other long term (current) drug therapy: Secondary | ICD-10-CM | POA: Diagnosis not present

## 2018-06-08 DIAGNOSIS — I2721 Secondary pulmonary arterial hypertension: Secondary | ICD-10-CM | POA: Diagnosis not present

## 2018-06-08 DIAGNOSIS — Z87891 Personal history of nicotine dependence: Secondary | ICD-10-CM | POA: Diagnosis not present

## 2018-06-08 DIAGNOSIS — I422 Other hypertrophic cardiomyopathy: Secondary | ICD-10-CM | POA: Insufficient documentation

## 2018-06-08 DIAGNOSIS — I270 Primary pulmonary hypertension: Secondary | ICD-10-CM

## 2018-06-08 DIAGNOSIS — G4733 Obstructive sleep apnea (adult) (pediatric): Secondary | ICD-10-CM | POA: Insufficient documentation

## 2018-06-08 DIAGNOSIS — Z7901 Long term (current) use of anticoagulants: Secondary | ICD-10-CM | POA: Diagnosis not present

## 2018-06-08 LAB — BASIC METABOLIC PANEL
Anion gap: 7 (ref 5–15)
BUN: 16 mg/dL (ref 8–23)
CHLORIDE: 111 mmol/L (ref 98–111)
CO2: 20 mmol/L — AB (ref 22–32)
CREATININE: 1.25 mg/dL — AB (ref 0.44–1.00)
Calcium: 9 mg/dL (ref 8.9–10.3)
GFR calc Af Amer: 49 mL/min — ABNORMAL LOW (ref >60)
GFR calc non Af Amer: 43 mL/min — ABNORMAL LOW (ref >60)
GLUCOSE: 86 mg/dL (ref 70–99)
Potassium: 4.4 mmol/L (ref 3.5–5.1)
Sodium: 138 mmol/L (ref 135–145)

## 2018-06-08 LAB — CBC
HEMATOCRIT: 38.2 % (ref 36.0–46.0)
Hemoglobin: 11.3 g/dL — ABNORMAL LOW (ref 12.0–15.0)
MCH: 28.8 pg (ref 26.0–34.0)
MCHC: 29.6 g/dL — AB (ref 30.0–36.0)
MCV: 97.2 fL (ref 80.0–100.0)
NRBC: 0 % (ref 0.0–0.2)
Platelets: 367 10*3/uL (ref 150–400)
RBC: 3.93 MIL/uL (ref 3.87–5.11)
RDW: 17.2 % — AB (ref 11.5–15.5)
WBC: 6.7 10*3/uL (ref 4.0–10.5)

## 2018-06-08 NOTE — Progress Notes (Signed)
CSW informed by clinic RN that pt is interested in women's support group.  CSW met with pt and provided with information regarding upcoming women's support group- pt reports she has reliable transportation and does not forsee any barriers to attending support group.  CSW will continue to follow and assist as needed  Jorge Ny, LCSW Clinical Social Worker [facility] [phone]

## 2018-06-08 NOTE — Patient Instructions (Signed)
Start Adempas.  Routine lab work today. Will notify you of abnormal results  Follow up in 6 weeks with an Echo.

## 2018-06-09 ENCOUNTER — Telehealth (HOSPITAL_COMMUNITY): Payer: Self-pay

## 2018-06-09 NOTE — Telephone Encounter (Signed)
Received enrollment form back from [facility] missing refills and MD signature.  Signature obtained and refills added. Faxed back, confirmation received.

## 2018-06-09 NOTE — Progress Notes (Signed)
PCP: Dr. Radene Ou Cardiology: Dr. Radford Pax HF Cardiology: Dr. Aundra Dubin Pulmonary: Dr. Lake Bells  73 y.o. with history of COPD, OSA on CPAP, apical hypertrophic cardiomyopathy, chronic diastolic CHF, and paroxysmal atrial fibrillation returns for followup of CHF and pulmonary hypertension.  Patient was admitted in 3/18 with CHF and dyspnea, found to have pulmonary arterial hypertension by RHC.  Prior to this, she has had a long history of PAF.  She is currently taking dronedarone but has had breakthrough fibrillation.  She is in NSR today.  She had an atrial fibrillation ablation in the past. She has apical hypertrophic cardiomyopathy proven by cardiac MRI with a consistent ECG.   She is on Opsumit and has started Selexipag, now titrated up to 1600 mcg bid. She was unable to tolerate Adcirca. Symptomatically, she has not seen significant improvement with selective pulmonary vasodilators to treat PAH.     I repeated her RHC in 3/19.  This showed lower PA pressure and normal right and left heart filling pressures, but more worrisomely, cardiac output was low.  However, on repeat of echo in 3/19, the RV actually looked normal in size and systolic function.  Given concern for possible PVOD as cause of her worsening symptoms on treatment, I ordered a high resolution CT chest.  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  I had her see Dr Lake Bells. He sent autoimmune serologies again, weak P-ANCA positivity so she was referred to rheumatology.  Echo bubble study was negative.  CCP and Ro-52 were positive.  Also of note, she has had atrial fibrillation ablation and pulmonary vein stenosis is a consideration.  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  She was started on prednisone and Cellcept.  Cellcept was stopped after GI bleed.  She  was seen by rheumatology at [facility] and started on azathioprine.   In 9/19, she was admitted with a GI bleed.  Colonoscopy did not show a definite cause.  She was started back on Eliquis.   She is stable symptomatically.  Still short of breath after walking around 200 feet.  She fatigues easily.  Able to do ADLs without much trouble.  Weight is up about 3 lbs. She remains on 4L home oxygen.   Labs (2/18): TSH normal Labs (3/18): K 3.8, creatinine 1.26, BNP 424 Labs (5/18): K 4.4, creatinine 1.96, anti-SCL 70 negative, RF very slightly elevated, HIV negative.  Labs (6/18): K 4, creatinine 1.2, LDL 49 Labs (9/18): BNP 149, K 4, creatinine 1.4, BNP 149 Labs (2/19): BNP 126, K 3.8, creatinine 1.5, CCP+, Ro-52+, elevated CK.  Labs (7/19): BNP 676, K 4.7, creatinine 1.74 Labs (9/19): K 3.8, creatinine 1.39, BNP 220 Labs (11/19): K 3.5, creatinine 1.37, BNP 134 Labs (1/20): K 4.1, creatinine 1.18  4/18 6 minute walk: 109 m  10/18 6 minute walk: 229 m 2/19 6 minute walk: < 100 m 5/19 6 minute walk: 107 m 1/20 6 minute walk: 188 m  PMH:  1. COPD: Quit smoking 2011.  - PFTs (3/18): minimal obstruction, minimal restriction, DLCO 22% (severely decreased).  2. Hypothyroidism 3. Hyperlipidemia 4. OSA: Uses CPAP 5. Apical hypertrophic cardiomyopathy:  - Cardiac MRI (7/16) with EF 79%, mid-apical LV hypertrophy with spade-like ventricle, mid-myocardial LGE was noted at the apex.   6. GERD 7. Atrial fibrillation: Paroxysmal.  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 Long QT interval so dofetilide and sotalol have not  been used. She has had atrial fibrillation ablation.  - Currently on dronedarone.  - DCCV to NSR in ER 7/19.  8. Coronary artery disease: LHC (7/16) with nonobstructive disease + 85% ostial stenosis small D1.  9. Diastolic CHF: Echo (5/57) with EF 65-70%, apical hypertrophic cardiomyopathy, severe LAE, PASP 86 mmHg.  - RHC (3/18): mean RA 4, PA 69/23 mean 38, mean PCWP 8, CI 2.17, PVR 7.3 WU  Fick, 9.6 WU thermo.  - Echo (3/19): EF 65-70%, apical hypertrophic cardiomyopathy, normal RV size and systolic function, severe LAE.  - Echo (6/19): EF 65-70%, apical HCM, restrictive diastolic dysfunction, negative bubble study, PASP 42 mmHg.  10. Pulmonary hypertension: See RHC above.  - V/Q scan negative 3/18 - HIV, SCL70 negative.  RF borderline elevated, doubt significant.  ANA negative.  - Unable to tolerate Adcirca - RHC (3/19): mean RA 6, PA 49/16 mean 29, mean PCWP 10, CI 1.9/PVR 5.3 WU Fick, CI 1.4/PVR 7.3 Thermo.  - High resolution CT chest (3/19): respiratory motion artifact present, but suspect mild emphysema with lungs grossly clear.  - P-ANCA+, CCP+, RO-52+, CK elevated.  - Echo 6/19 with negative bubble study.  11. CPX (12/17): peak VO2 8.5, RER 0.96, VE/VCO2 slope 67. Submaximal but appears to show severe functional impairment.   12. CKD: Stage 3.  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   Social History   Socioeconomic History  . Marital status: Widowed    Spouse name: Not on file  . Number of children: 2  . Years of education: Not on file  . Highest education level: Not on file  Occupational History  . Occupation: retired. still works for [employer].   Social Needs  . Financial resource strain: Not hard at all  . Food insecurity:    Worry: Never true    Inability: Never true  . Transportation needs:    Medical: No    Non-medical: No  Tobacco Use  . Smoking status: Former Smoker    Packs/day: 0.50    Years: 44.00    Pack years: 22.00    Types: Cigarettes    Last attempt to quit: 04/07/2009    Years since quitting: 9.1  . Smokeless tobacco: Never Used  Substance and Sexual Activity  . Alcohol use: No  . Drug use: No  . Sexual activity: Never  Lifestyle  . Physical activity:    Days per week: Not on file    Minutes per session: Not on file  . Stress: Not on file   Relationships  . Social connections:    Talks on phone: Not on file    Gets together: Not on file    Attends religious service: Not on file    Active member of club or organization: Not on file    Attends meetings of clubs or organizations: Not on file    Relationship status: Not on file  . Intimate partner violence:    Fear of current or ex partner: Not on file    Emotionally abused: Not on file    Physically abused: Not on file    Forced sexual activity: Not on file  Other Topics Concern  . Not on file  Social History Narrative   Pt lives alone with 2 dogs.    Family History  Problem Relation Age of Onset  . Heart disease Sister        Good Pastures Syndrome  . Gout Father   .  Lung cancer Father        smoked  . Breast cancer Paternal Aunt    Review of systems complete and found to be negative unless listed in HPI.    Current Outpatient Medications  Medication Sig Dispense Refill  . acetaminophen (TYLENOL) 325 MG tablet Take 2 tablets (650 mg total) by mouth every 4 (four) hours as needed for headache or mild pain.    Marland Kitchen apixaban (ELIQUIS) 5 MG TABS tablet Take 1 tablet (5 mg total) by mouth 2 (two) times daily. 60 tablet 1  . atorvastatin (LIPITOR) 80 MG tablet Take 1 tablet (80 mg total) by mouth daily with lunch. 90 tablet 3  . azaTHIOprine (IMURAN) 50 MG tablet Take 100 mg by mouth daily.    . Cholecalciferol (VITAMIN D) 2000 UNITS CAPS Take 2,000 Units by mouth daily.     . colchicine 0.6 MG tablet Take 0.6 mg by mouth daily.     Marland Kitchen ezetimibe (ZETIA) 10 MG tablet Take 1 tablet (10 mg total) by mouth daily. Patient overdue for appt w/Dr Turner,must call and schedule for further refills3rd/final attempt (Patient taking differently: Take 10 mg by mouth daily. ) 15 tablet 0  . furosemide (LASIX) 20 MG tablet Take 2 tablets (40 mg total) by mouth daily. 60 tablet 6  . ipratropium-albuterol (DUONEB) 0.5-2.5 (3) MG/3ML SOLN Take 3 mLs by nebulization 3 (three) times daily. DX:  J44.9 (Patient taking differently: Take 3 mLs by nebulization 3 (three) times daily. DX: J44.9) 360 mL 5  . levothyroxine (SYNTHROID, LEVOTHROID) 75 MCG tablet Take 75 mcg by mouth daily before breakfast.     . macitentan (OPSUMIT) 10 MG tablet Take 1 tablet (10 mg total) by mouth daily. 30 tablet 11  . metoprolol tartrate (LOPRESSOR) 25 MG tablet Take 1/2-1 tablet by mouth every 6 hours AS NEEDED for breakthrough afib HR over 100 45 tablet 1  . MULTAQ 400 MG tablet TAKE 1 TABLET (400 MG TOTAL) BY MOUTH 2 (TWO) TIMES DAILY WITH A MEAL. 180 tablet 1  . Multiple Vitamin (MULTIVITAMIN) capsule Take 1 capsule by mouth daily. chewable    . nitroGLYCERIN (NITROSTAT) 0.4 MG SL tablet PLACE 1 TABLET (0.4 MG TOTAL) UNDER THE TONGUE EVERY 5 (FIVE) MINUTES AS NEEDED FOR CHEST PAIN. 25 tablet 4  . pantoprazole (PROTONIX) 40 MG tablet Take 1 tablet (40 mg total) by mouth daily. 90 tablet 2  . potassium chloride SA (K-DUR,KLOR-CON) 20 MEQ tablet Take 1 tablet (20 mEq total) by mouth daily. (Patient taking differently: Take 10 mEq by mouth daily. Pt taking differently 1/2 tab of 20 mEq daily) 30 tablet 6  . Selexipag (UPTRAVI) 1600 MCG TABS Take 1,600 mcg by mouth 2 (two) times daily.     Marland Kitchen umeclidinium-vilanterol (ANORO ELLIPTA) 62.5-25 MCG/INH AEPB Inhale 1 puff into the lungs daily. 1 each 0  . UNABLE TO FIND Med Name: CPAP  DME[facility]    . vitamin B-12 100 MCG tablet Take 1 tablet (100 mcg total) by mouth daily. 30 tablet 0  . Riociguat (ADEMPAS) 0.5 MG TABS Take by mouth. 90 tablet    No current facility-administered medications for this encounter.    BP (!) 98/58   Pulse 63   Wt 83.2 kg (183 lb 6.4 oz)   SpO2 93% Comment: 4L n/c  BMI 34.09 kg/m   General: NAD Neck: No JVD, no thyromegaly or thyroid nodule.  Lungs: Mild crackles at bases bilaterally.  CV: Nondisplaced PMI.  Heart regular S1/S2, no  S3/S4, no murmur.  No peripheral edema.  No carotid bruit.  Normal pedal pulses.  Abdomen: Soft,  nontender, no hepatosplenomegaly, no distention.  Skin: Intact without lesions or rashes.  Neurologic: Alert and oriented x 3.  Psych: Normal affect. Extremities: No clubbing or cyanosis.  HEENT: Normal.   Assessment/Plan: 1. Chronic diastolic CHF: Echo 9/50 with EF 65-70%, normal RV size and systolic function.  Given worsening symptoms and the finding of low cardiac output on RHC, I had expected her RV to look hypokinetic. However, on 6/19 echo, the RV looked fairly normal. Today on exam, she does not appear volume overloaded.  Chronic NYHA class III symptoms.   - She can continue Lasix 40 mg daily.  BMET today.  2. Apical hypertrophic cardiomyopathy: Cardiac MRI in 7/16 showed mid-apical LV hypertrophy with vigorous contraction. Now off metoprolol with slow HR (uses metoprolol prn tachycardia - atrial fibrillation).  ECG is consistent with apical HCM.   3. Atrial fibrillation:  Remains in NSR by exam today on Multaq.  Per Dr Rayann Heman, would not re-do her ablation. Breakthrough atrial fibrillation in 7/19, DCCV back to NSR in ER.   - Continue Eliquis.  No further GI bleeding.     - Continue dronedarone.  She does have CHF/volume overload but will continue dronedarone for now unless CHF becomes difficult to manage. She is not a candidate for redo ablation and does not have other anti-arrhythmic options per Dr. Rayann Heman.  4. Pulmonary hypertension: RHC in 3/18 showed moderate pulmonary arterial hypertension with elevated PVR (7.3 WU by Fick, 9.6 WU by thermo). She has COPD but I do not think this plays a large role in the pulmonary hypertension =>  PFTs in 3/18 showed minimal obstruction and restriction, there was significantly decreased DLCO, this may be due to pulmonary vascular disease.  OSA may play a role but not a large one. V/Q scan did not suggest chronic PEs in 3/18.  RF borderline elevated (probably not significant), HIV negative, Anti-SCL70 negative, ANA negative, P-ANCA weakly positive.  I  suspected group 1 PAH, but she has not significantly improved with pulmonary vasodilators.  Her RHC in 3/19 showed lower PA pressure and PVR with normal right and left heart filling pressures, but cardiac output was lower than prior.  Surprisingly, her RV looked normal on echo so I question how accurate the cardiac output measurement really was.  Bubble study negative on echo.  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  - Also of note, she has a history of afib ablation so pulmonary vein stenosis certainly remains a concern for raising PA pressure but think this is unlikely as the cause for her presentation.   - Continue Opsumit.     - She did not tolerate Adcirca.   - Continue Selexipag 1600 bid.  - I am going to try to add Adempas to her regimen.  5. Anti-synthetase syndrome: Elevated CK, positive myositis panel, nonspecific interstitial pneumonia. Being seen by rheumatology at [facility], on azathioprine now.   Followup in 6 wks with echo and 6 minute walk.   [name], MD  06/09/2018

## 2018-06-10 DIAGNOSIS — F0631 Mood disorder due to known physiological condition with depressive features: Secondary | ICD-10-CM | POA: Diagnosis not present

## 2018-06-11 NOTE — Telephone Encounter (Signed)
PA for adempas faxed to [place].  Confirmation received.

## 2018-06-11 NOTE — Telephone Encounter (Signed)
Prior authorization through [employer] was APPROVED for [place] and will expire on 06/10/2021.

## 2018-06-15 ENCOUNTER — Ambulatory Visit (INDEPENDENT_AMBULATORY_CARE_PROVIDER_SITE_OTHER): Payer: Medicare Other | Admitting: Pulmonary Disease

## 2018-06-15 ENCOUNTER — Encounter: Payer: Self-pay | Admitting: Pulmonary Disease

## 2018-06-15 VITALS — BP 90/52 | HR 73 | Ht 62.0 in | Wt 182.0 lb

## 2018-06-15 DIAGNOSIS — J849 Interstitial pulmonary disease, unspecified: Secondary | ICD-10-CM | POA: Diagnosis not present

## 2018-06-15 NOTE — Patient Instructions (Signed)
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

## 2018-06-15 NOTE — Progress Notes (Signed)
Synopsis: Patient of Dr. Melvyn Novas and Aundra Dubin with pulmonary hypertension, centrilobular emphysema, prior concern for possible amiodarone toxicity, OSA and asthma.  She also has diastolic heart failure.   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  She used to smoke cigarettes from age 73 until age 88.    Subjective:   PATIENT ID: Amy [name] GENDER: female DOB: [birth year], MRN: [id number]   HPI  Chief Complaint  Patient presents with  . Follow-up    f/u ILD   While taking Anoro she said that her atrial fib was worse so she stopped.  She has been taking imuran at night.  She says that the albuterol has made her feel anxious and jittery. She plans to travel but she feels that her oxygen needs will inhibit her from doing this. She saw the advanced heart failure clinic last week, weight has been stable recently No recent problems with cough or bronchitis  Past Medical History:  Diagnosis Date  . Antisynthetase syndrome ([place])   . Apical variant hypertrophic cardiomyopathy ([place])    Diagnosed by echo and ECG 11/13  . Atypical atrial flutter ([place])   . CAD (coronary artery disease)    a. LHC in 10/2011 demonstrated non-obstructive disease and an 80% lesion in a small D1 treated medically.   . CHF (congestive heart failure) ([place])   . Chronic diastolic heart failure ([place])   . CKD (chronic kidney disease), stage III ([place])   . COPD (chronic obstructive pulmonary disease) ([place])   . Dizziness    had PT for being off balance - in approx. 2012  . GERD (gastroesophageal reflux disease)   . Hyperlipidemia   . Hypertension   . Hypothyroid   . Lumbar disc disease   . Macular degeneration   . OSA (obstructive sleep apnea)   . Persistent atrial fibrillation       . Pulmonary hypertension ([place])    a. [place] 05/2013 showed mild PAH with normal PCWP and RA pressure, could be related to OSA  and low oxygen saturation.   . Refusal of blood transfusions as patient is Jehovah's Witness   . Sinus bradycardia      Review of Systems  Constitutional: Positive for malaise/fatigue. Negative for chills, fever and weight loss.  HENT: Negative for congestion, nosebleeds, sinus pain and sore throat.   Eyes: Negative for photophobia, pain and discharge.  Respiratory: Positive for cough and shortness of breath. Negative for hemoptysis, sputum production and wheezing.   Cardiovascular: Positive for leg swelling. Negative for chest pain, palpitations and orthopnea.  Gastrointestinal: Negative for abdominal pain, constipation, diarrhea, nausea and vomiting.  Genitourinary: Negative for dysuria, frequency, hematuria and urgency.  Musculoskeletal: Negative for back pain, joint pain, myalgias and neck pain.  Skin: Negative for itching and rash.  Neurological: Negative for tingling, tremors, sensory change, speech change, focal weakness, seizures, weakness and headaches.  Psychiatric/Behavioral: Negative for memory loss, substance abuse and suicidal ideas. The patient is not nervous/anxious.       Objective:  Physical Exam   Vitals:   06/15/18 0942  BP: (!) 90/52  Pulse: 73  SpO2: 94%  Weight: 182 lb (82.6 kg)  Height: _0  (1.575 m)   4L [place]  Gen: chronically ill appearing HENT: OP clear, TM's clear, neck supple PULM: Few crackles bases B, normal percussion CV: RRR, no mgr, trace edema GI: BS+, soft, nontender Derm:  no cyanosis or rash Psyche: normal mood and affect      CBC    Component Value Date/Time   WBC 6.7 06/08/2018 1211   RBC 3.93 06/08/2018 1211   HGB 11.3 (L) 06/08/2018 1211   HCT 38.2 06/08/2018 1211   PLT 367 06/08/2018 1211   MCV 97.2 06/08/2018 1211   MCH 28.8 06/08/2018 1211   MCHC 29.6 (L) 06/08/2018 1211   RDW 17.2 (H) 06/08/2018 1211   LYMPHSABS 0.8 05/19/2018 1048   MONOABS 0.6 05/19/2018 1048   EOSABS 0.2 05/19/2018 1048   BASOSABS 0.0  05/19/2018 1048     Chest imaging: - V/Q scan negative 3/22 June 2017 high-resolution CT scan of the chest images independently reviewed showing patchy groundglass throughout with air trapping but no clear evidence of underlying interstitial lung disease.  Some evidence of emphysema seen, motion degraded images October 2019 high-resolution CT scan of the chest: Prominently dilated main pulmonary artery, some patchy groundglass attenuation throughout both lungs, somewhat centrilobular, scattered subpleural reticulation differential diagnosis includes NSIP versus UIP.  Some centrilobular emphysema noted, air trapping noted.  Images independently reviewed, there is definitely moderate centrilobular emphysema and an upper lobe predominant fashion as well as centrilobular groundglass  PFT: March 2018 ratio 83%, FVC 1.88 L 89% predicted, significant change with bronchodilator in the small airways, total lung capacity 3.63 L 76% predicted, DLCO 4.75 mL 22% predicted September 2019 pulmonary function testing: FVC 1.81 L 87% predicted, DLCO 7.5 mL 32% predicted   Labs: - HIV, SCL70 negative.  RF borderline elevated, doubt significant.  ANA negative.  - Lab work from rheumatology in 2019:  P-ANCA test was negative, myositis panel showed an elevated Ro-52 lab and the CK was elevated at 1030, CCP elevated -September 2019 CK total 196, Ro 52 antibody positive on myositis panel  Path:  Echo: Echo (3/18) with EF 65-70%, apical hypertrophic cardiomyopathy, severe LAE, PASP 86 mmHg.  Echo (3/19): EF 65-70%, apical hypertrophic cardiomyopathy, normal RV size and systolic function, severe LAE.  Echo 6/19: EF normal, BUBBLE STUDY NEGATIVE  Heart Catheterization: - RHC (3/18): mean RA 4, PA 69/23 mean 38, mean PCWP 8, CI 2.17, PVR 7.3 WU Fick, 9.6 WU thermo.  - RHC (3/19): mean RA 6, PA 49/16 mean 29, mean PCWP 10, CI 1.9/PVR 5.3 WU Fick, CI 1.4/PVR 7.3 Thermo.   6 Min walk: 4/18 6 minute walk: 109 m    10/18 6 minute walk: 229 m 2/19 6 minute walk: < 100 m 1/20 6 minute walk: 188 m       Assessment & Plan:   No diagnosis found.  Discussion: Darris did not tolerate bronchodilator treatment, certainly there is no need to encourage her to take these medicines if they make you feel worse.  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  These lead to pulmonary hypertension. She is tolerating Imuran fairly well.  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: Continue Imuran 50 mg daily We will continue blood work on a monthly basis to monitor your kidney function and monitored for toxicity from the medicine: CBC and basic metabolic panel, next test beginning of April Pulmonary function test now to monitor for disease activity  Centrilobular emphysema: If you have increasing wheezing try using the albuterol It is okay to stay off of Anoro because this caused you to have worsening symptoms  Immunocompromise state: Practice good hand hygiene, avoid the ill right now  We will see you back in 2 to 3 months or sooner if need   Current Outpatient Medications:  .  acetaminophen (TYLENOL) 325 MG tablet, Take 2 tablets (650 mg total) by mouth every 4 (four) hours as needed for headache or mild pain., Disp: , Rfl:  .  apixaban (ELIQUIS) 5 MG TABS tablet, Take 1 tablet (5 mg total) by mouth 2 (two) times daily., Disp: 60 tablet, Rfl: 1 .  atorvastatin (LIPITOR) 80 MG tablet, Take 1 tablet (80 mg total) by mouth daily with lunch., Disp: 90 tablet, Rfl: 3 .  azaTHIOprine (IMURAN) 50 MG tablet, Take 100 mg by mouth daily., Disp: , Rfl:  .  Cholecalciferol (VITAMIN D) 2000 UNITS CAPS, Take 2,000 Units by mouth daily. , Disp: , Rfl:  .  colchicine 0.6 MG tablet, Take 0.6 mg by mouth daily. , Disp: , Rfl:  .  ezetimibe (ZETIA) 10 MG  tablet, Take 1 tablet (10 mg total) by mouth daily. Patient overdue for appt w/Dr Turner,must call and schedule for further refills3rd/final attempt (Patient taking differently: Take 10 mg by mouth daily. ), Disp: 15 tablet, Rfl: 0 .  furosemide (LASIX) 20 MG tablet, Take 2 tablets (40 mg total) by mouth daily., Disp: 60 tablet, Rfl: 6 .  ipratropium-albuterol (DUONEB) 0.5-2.5 (3) MG/3ML SOLN, Take 3 mLs by nebulization 3 (three) times daily. DX: J44.9 (Patient taking differently: Take 3 mLs by nebulization 3 (three) times daily. DX: J44.9), Disp: 360 mL, Rfl: 5 .  levothyroxine (SYNTHROID, LEVOTHROID) 75 MCG tablet, Take 75 mcg by mouth daily before breakfast. , Disp: , Rfl:  .  macitentan (OPSUMIT) 10 MG tablet, Take 1 tablet (10 mg total) by mouth daily., Disp: 30 tablet, Rfl: 11 .  metoprolol tartrate (LOPRESSOR) 25 MG tablet, Take 1/2-1 tablet by mouth every 6 hours AS NEEDED for breakthrough afib HR over 100, Disp: 45 tablet, Rfl: 1 .  MULTAQ 400 MG tablet, TAKE 1 TABLET (400 MG TOTAL) BY MOUTH 2 (TWO) TIMES DAILY WITH A MEAL., Disp: 180 tablet, Rfl: 1 .  Multiple Vitamin (MULTIVITAMIN) capsule, Take 1 capsule by mouth daily. chewable, Disp: , Rfl:  .  nitroGLYCERIN (NITROSTAT) 0.4 MG SL tablet, PLACE 1 TABLET (0.4 MG TOTAL) UNDER THE TONGUE EVERY 5 (FIVE) MINUTES AS NEEDED FOR CHEST PAIN., Disp: 25 tablet, Rfl: 4 .  pantoprazole (PROTONIX) 40 MG tablet, Take 1 tablet (40 mg total) by mouth daily., Disp: 90 tablet, Rfl: 2 .  potassium chloride SA (K-DUR,KLOR-CON) 20 MEQ tablet, Take 1 tablet (20 mEq total) by mouth daily. (Patient taking differently: Take 10 mEq by mouth daily. Pt taking differently 1/2 tab of 20 mEq daily), Disp: 30 tablet, Rfl: 6 .  Riociguat (ADEMPAS) 0.5 MG TABS, Take by mouth., Disp: 90 tablet, Rfl:  .  Selexipag (UPTRAVI) 1600 MCG TABS, Take 1,600 mcg by mouth 2 (two) times daily. , Disp: , Rfl:  .  umeclidinium-vilanterol (ANORO ELLIPTA) 62.5-25 MCG/INH AEPB, Inhale 1 puff  into the lungs daily., Disp: 1 each, Rfl: 0 .  UNABLE TO FIND, Med Name: CPAP  DME-[facility], Disp: , Rfl:  .  vitamin B-12 100 MCG tablet, Take 1 tablet (100 mcg total) by mouth daily., Disp: 30 tablet, Rfl: 0

## 2018-06-16 ENCOUNTER — Telehealth (HOSPITAL_COMMUNITY): Payer: Self-pay | Admitting: *Deleted

## 2018-06-16 NOTE — Telephone Encounter (Signed)
Received the following message from [facility]:  Amy [name] 08-27-1945 Please assist with [facility] request, "Would you ask Dr. Claris Gladden office if the [facility] discussed with them having low BP and taking nitroglycerin tablets.  During the initial consult, patient told us she was going to discuss it with them before starting Adempas.  The order is scheduled to ship today.  Do they still want [name] to ship it or wait?"  Attempted to call pt to clarify and left her a message to call us back.

## 2018-06-17 NOTE — Telephone Encounter (Signed)
Spoke w/pt, she states she is concerned about starting the Adempas b/c her BP runs low and she knows this med could cause it go even lower.  She states her BP mainly runs 90-100s.  Advised I would send to Dr Aundra Dubin to make sure ok to start med

## 2018-06-17 NOTE — Telephone Encounter (Signed)
Ok, unlikely to have much effect on BP and will titrate slowly.

## 2018-06-18 NOTE — Telephone Encounter (Signed)
Patient called to report bp has been low 85/58. I called patient back to see if she was symptomatic. No answer left VM for her to call our office back.

## 2018-06-24 DIAGNOSIS — F0631 Mood disorder due to known physiological condition with depressive features: Secondary | ICD-10-CM | POA: Diagnosis not present

## 2018-06-25 ENCOUNTER — Other Ambulatory Visit (HOSPITAL_COMMUNITY): Payer: Self-pay | Admitting: Cardiology

## 2018-06-27 ENCOUNTER — Other Ambulatory Visit (HOSPITAL_COMMUNITY): Payer: Self-pay | Admitting: Cardiology

## 2018-07-02 DIAGNOSIS — F0631 Mood disorder due to known physiological condition with depressive features: Secondary | ICD-10-CM | POA: Diagnosis not present

## 2018-07-06 NOTE — Telephone Encounter (Signed)
LATE ENTRY: Patient called again after receiving medication information regarding Adempas. Side effects include lower b/p  Patient reports her b/p normally runs lower and hesitant to add add'l medications to cause her b/p to lower  Again advised per Dr Aundra Dubin, will have her titrate slowly  Specialty RN with specialty pharmacy will be in contact with patient regarding further instructions/ titrations.

## 2018-07-08 ENCOUNTER — Telehealth: Payer: Self-pay | Admitting: Pulmonary Disease

## 2018-07-08 ENCOUNTER — Other Ambulatory Visit (INDEPENDENT_AMBULATORY_CARE_PROVIDER_SITE_OTHER): Payer: Medicare Other

## 2018-07-08 DIAGNOSIS — J849 Interstitial pulmonary disease, unspecified: Secondary | ICD-10-CM | POA: Diagnosis not present

## 2018-07-08 LAB — CBC WITH DIFFERENTIAL/PLATELET
Basophils Absolute: 0.1 10*3/uL (ref 0.0–0.1)
Basophils Relative: 0.9 % (ref 0.0–3.0)
Eosinophils Absolute: 0.2 10*3/uL (ref 0.0–0.7)
Eosinophils Relative: 3 % (ref 0.0–5.0)
HCT: 39.9 % (ref 36.0–46.0)
Hemoglobin: 12.8 g/dL (ref 12.0–15.0)
Lymphocytes Relative: 16.1 % (ref 12.0–46.0)
Lymphs Abs: 1 10*3/uL (ref 0.7–4.0)
MCHC: 32 g/dL (ref 30.0–36.0)
MCV: 92.5 fl (ref 78.0–100.0)
Monocytes Absolute: 0.5 10*3/uL (ref 0.1–1.0)
Monocytes Relative: 8.3 % (ref 3.0–12.0)
Neutro Abs: 4.4 10*3/uL (ref 1.4–7.7)
Neutrophils Relative %: 71.7 % (ref 43.0–77.0)
Platelets: 306 10*3/uL (ref 150.0–400.0)
RBC: 4.31 Mil/uL (ref 3.87–5.11)
RDW: 17.3 % — ABNORMAL HIGH (ref 11.5–15.5)
WBC: 6.2 10*3/uL (ref 4.0–10.5)

## 2018-07-08 LAB — COMPREHENSIVE METABOLIC PANEL
ALT: 22 U/L (ref 0–35)
AST: 26 U/L (ref 0–37)
Albumin: 4.2 g/dL (ref 3.5–5.2)
Alkaline Phosphatase: 85 U/L (ref 39–117)
BUN: 19 mg/dL (ref 6–23)
CO2: 23 mEq/L (ref 19–32)
Calcium: 9 mg/dL (ref 8.4–10.5)
Chloride: 109 mEq/L (ref 96–112)
Creatinine, Ser: 1.2 mg/dL (ref 0.40–1.20)
GFR: 53.25 mL/min — ABNORMAL LOW (ref >60.00)
Glucose, Bld: 103 mg/dL — ABNORMAL HIGH (ref 70–99)
Potassium: 4.2 mEq/L (ref 3.5–5.1)
Sodium: 140 mEq/L (ref 135–145)
Total Bilirubin: 0.3 mg/dL (ref 0.2–1.2)
Total Protein: 7.1 g/dL (ref 6.0–8.3)

## 2018-07-08 NOTE — Telephone Encounter (Signed)
Patient does have monthly lab order. She was asked COVID question and is negative for all symptoms.  Patient would like labs faxed to Dr. Alessandra Bevels at [phone]. Nothing further needed at this time.

## 2018-07-09 DIAGNOSIS — F0631 Mood disorder due to known physiological condition with depressive features: Secondary | ICD-10-CM | POA: Diagnosis not present

## 2018-07-13 ENCOUNTER — Telehealth (HOSPITAL_COMMUNITY): Payer: Self-pay

## 2018-07-13 NOTE — Telephone Encounter (Signed)
Called to let this patient know that she should not take nitroglycerine at all now that she is to be starting adempas. Pt has concerns that her blood pressure is too low and doesn't know why we would start her on this medication when her blood pressures are in the 50'Z systolic regularly. She seems very concerned that her blood pressure will get too low. Advice for pt?

## 2018-07-13 NOTE — Telephone Encounter (Signed)
Systolic pressure in 73G is not too low as long as she is not lightheaded.  Adempas is unlikely to lower it much further than that anyway.

## 2018-07-14 NOTE — Telephone Encounter (Signed)
Spoke with the patient. Relayed info from dr. Aundra Dubin. Pt concerned that adempas nurse has not contacted her about setting up an appointment yet. Let her know there was a hang up with the contraindication with nitro and she is agreeable to proceed with the plan. No further questions at this time.

## 2018-07-16 ENCOUNTER — Telehealth (HOSPITAL_COMMUNITY): Payer: Self-pay | Admitting: Cardiology

## 2018-07-16 NOTE — Telephone Encounter (Signed)
Patient called to report low blood pressure reading. 4/9 AM w/o meds 121/69  4/9 PM w/ meds 84/48 4/10 AM 89/62  Patient has NOT started adempas yet and medication was just delivered yesterday. [facility] RN is scheduled to speak with patient today regarding medication titration.  Advised to be sure to speak with speciality nurse as they have protocols for safely starting medications.  Would you like to advise anything further?

## 2018-07-16 NOTE — Telephone Encounter (Signed)
Hey, looks like she has called with similar concerns in the last few weeks. Dr Aundra Dubin does not think adempas will lower BP much further and wants her to titrate slowly to make sure she tolerates. She can hold a dose of lasix to see if this helps BP. Otherwise if she is feeling fine, no changes.

## 2018-07-19 NOTE — Telephone Encounter (Signed)
Pt aware and voiced understanding.  Reports she was advised to start adempas and has tolerated well. Reports she has held lasix in the past and it did not help her b/p in the past, will continue with slow titration for now

## 2018-07-28 ENCOUNTER — Telehealth (HOSPITAL_COMMUNITY): Payer: Self-pay | Admitting: Cardiology

## 2018-07-28 ENCOUNTER — Ambulatory Visit (HOSPITAL_COMMUNITY): Payer: Medicare Other

## 2018-07-28 ENCOUNTER — Encounter (HOSPITAL_COMMUNITY): Payer: Medicare Other | Admitting: Cardiology

## 2018-07-28 DIAGNOSIS — I5032 Chronic diastolic (congestive) heart failure: Secondary | ICD-10-CM

## 2018-07-28 NOTE — Telephone Encounter (Signed)
ok 

## 2018-07-28 NOTE — Telephone Encounter (Signed)
Patient recently started adempas and reports since starting her b/p has been dropping lower 80's/50's. Patient would like to stop as she feels this medication is making her feel bad    Please advise

## 2018-07-29 NOTE — Telephone Encounter (Signed)
PT AWARE THAT [facility] IS OK WITH PATIENT STOPPING ADEMPAS AFTER MULTIPLE CONCERNS REGARDING SIDE EFFECTS AND LOWER B/P'S.    PATIENT REPORTS SHE WILL GIVE ADEMPAS ANOTHER 10 DAYS AND FURTHER EVALUATE AT THAT TIME. ADVISED I WILL UPDATE HER [facility]  DURING THE PHONE CALL PATIENT REQUESTED TO HAVE LABS DONE PRIOR TO VIRTUAL VISIT WITH DR [facility] AND ROUTINE LABS ORDERED BR DR CHOW WITH [facility]

## 2018-08-02 ENCOUNTER — Other Ambulatory Visit: Payer: Self-pay

## 2018-08-02 ENCOUNTER — Ambulatory Visit (HOSPITAL_COMMUNITY)
Admission: RE | Admit: 2018-08-02 | Discharge: 2018-08-02 | Disposition: A | Discharge status: Home / Self Care | Payer: Medicare Other | Source: Ambulatory Visit | Attending: Cardiology | Admitting: Cardiology

## 2018-08-02 DIAGNOSIS — I5032 Chronic diastolic (congestive) heart failure: Secondary | ICD-10-CM | POA: Diagnosis not present

## 2018-08-02 LAB — CBC WITH DIFFERENTIAL/PLATELET
Abs Immature Granulocytes: 0.02 10*3/uL (ref 0.00–0.07)
Basophils Absolute: 0.1 10*3/uL (ref 0.0–0.1)
Basophils Relative: 1 %
Eosinophils Absolute: 0.2 10*3/uL (ref 0.0–0.5)
Eosinophils Relative: 3 %
HCT: 37.4 % (ref 36.0–46.0)
Hemoglobin: 11.7 g/dL — ABNORMAL LOW (ref 12.0–15.0)
Immature Granulocytes: 0 %
Lymphocytes Relative: 11 %
Lymphs Abs: 0.6 10*3/uL — ABNORMAL LOW (ref 0.7–4.0)
MCH: 29.2 pg (ref 26.0–34.0)
MCHC: 31.3 g/dL (ref 30.0–36.0)
MCV: 93.3 fL (ref 80.0–100.0)
Monocytes Absolute: 0.6 10*3/uL (ref 0.1–1.0)
Monocytes Relative: 11 %
Neutro Abs: 4.2 10*3/uL (ref 1.7–7.7)
Neutrophils Relative %: 74 %
Platelets: 237 10*3/uL (ref 150–400)
RBC: 4.01 MIL/uL (ref 3.87–5.11)
RDW: 16.5 % — ABNORMAL HIGH (ref 11.5–15.5)
WBC: 5.7 10*3/uL (ref 4.0–10.5)
nRBC: 0 % (ref 0.0–0.2)

## 2018-08-02 LAB — COMPREHENSIVE METABOLIC PANEL
ALT: 20 U/L (ref 0–44)
AST: 21 U/L (ref 15–41)
Albumin: 3.7 g/dL (ref 3.5–5.0)
Alkaline Phosphatase: 71 U/L (ref 38–126)
Anion gap: 10 (ref 5–15)
BUN: 16 mg/dL (ref 8–23)
CO2: 22 mmol/L (ref 22–32)
Calcium: 9.2 mg/dL (ref 8.9–10.3)
Chloride: 107 mmol/L (ref 98–111)
Creatinine, Ser: 1.27 mg/dL — ABNORMAL HIGH (ref 0.44–1.00)
GFR calc Af Amer: 48 mL/min — ABNORMAL LOW (ref >60)
GFR calc non Af Amer: 42 mL/min — ABNORMAL LOW (ref >60)
Glucose, Bld: 107 mg/dL — ABNORMAL HIGH (ref 70–99)
Potassium: 4 mmol/L (ref 3.5–5.1)
Sodium: 139 mmol/L (ref 135–145)
Total Bilirubin: 0.9 mg/dL (ref 0.3–1.2)
Total Protein: 6.6 g/dL (ref 6.5–8.1)

## 2018-08-03 ENCOUNTER — Ambulatory Visit (HOSPITAL_COMMUNITY)
Admission: RE | Admit: 2018-08-03 | Discharge: 2018-08-03 | Disposition: A | Discharge status: Home / Self Care | Payer: Medicare Other | Source: Ambulatory Visit | Attending: Cardiology | Admitting: Cardiology

## 2018-08-03 ENCOUNTER — Encounter (HOSPITAL_COMMUNITY): Payer: Self-pay

## 2018-08-03 DIAGNOSIS — I509 Heart failure, unspecified: Secondary | ICD-10-CM

## 2018-08-03 DIAGNOSIS — I480 Paroxysmal atrial fibrillation: Secondary | ICD-10-CM | POA: Diagnosis not present

## 2018-08-03 DIAGNOSIS — I270 Primary pulmonary hypertension: Secondary | ICD-10-CM

## 2018-08-03 NOTE — Progress Notes (Signed)
Pt stopped by the office to notify us that she has stopped adempas. States that her bp while on this medication dropped to 80's/40's.

## 2018-08-03 NOTE — Patient Instructions (Signed)
STAY OFF Adempas  Your physician has requested that you have an echocardiogram. Echocardiography is a painless test that uses sound waves to create images of your heart. It provides your doctor with information about the size and shape of your heart and how well your heart's chambers and valves are working. This procedure takes approximately one hour. There are no restrictions for this procedure. This will be done at your follow up appointment with Dr. Aundra Dubin.  Please follow up with Dr. Aundra Dubin in 2 months with an echocardiogram.

## 2018-08-03 NOTE — Addendum Note (Signed)
Encounter addended by: Marlise Eves, RN on: 08/03/2018 9:56 AM  Actions taken: Clinical Note Signed

## 2018-08-03 NOTE — Progress Notes (Signed)
Heart Failure TeleHealth Note  Due to national recommendations of social distancing due to [place] 19, Audio/video telehealth visit is felt to be most appropriate for this patient at this time.  See MyChart message from today for patient consent regarding telehealth for [name].  Date:  08/03/2018   ID:  Amy [name], DOB Aug 07, 1945, MRN [id number]  Location: Home  Provider location: [place] [facility] Type of Visit: Established patient   PCP:  Aretta Nip, MD  Cardiologist: Dr. Aundra Dubin  Chief Complaint: Shortness of breath.    History of Present Illness: Amy [name] is a 73 y.o. female who presents via Engineer, civil (consulting) for a telehealth visit today.     she denies symptoms worrisome for COVID 19.   Patient has a history of COPD, OSA on CPAP, apical hypertrophic cardiomyopathy, pulmonary hypertension, chronic diastolic CHF, and paroxysmal atrial fibrillation.  She was admitted in 3/18 with CHF and dyspnea, found to have pulmonary arterial hypertension by RHC.  Prior to this, she has had a long history of PAF.  She is currently taking dronedarone but has had breakthrough fibrillation. She had an atrial fibrillation ablation in the past. She has apical hypertrophic cardiomyopathy proven by cardiac MRI with a consistent ECG.   She is on Opsumit and has started Selexipag, now titrated up to 1600 mcg bid. She was unable to tolerate Adcirca. Symptomatically, she has not seen significant improvement with selective pulmonary vasodilators to treat PAH.     I repeated her RHC in 3/19.  This showed lower PA pressure and normal right and left heart filling pressures, but more worrisomely, cardiac output was low.  However, on repeat of echo in 3/19, the RV actually looked normal in size and systolic function.  Given concern for possible PVOD as cause of her worsening symptoms on treatment, I ordered a high resolution CT chest.  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  I had her see Dr [place] Bells. He sent autoimmune serologies again, weak P-ANCA positivity so she was referred to rheumatology.  Echo bubble study was negative.  CCP and Ro-52 were positive.  Also of note, she has had atrial fibrillation ablation and pulmonary vein stenosis is a consideration.  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  She was started on prednisone and Cellcept.  Cellcept was stopped after GI bleed.  She was seen by rheumatology at [facility] and started on azathioprine.   In 9/19, she was admitted with a GI bleed.  Colonoscopy did not show a definite cause.  She was started back on Eliquis.   She was recently started on Adempas.  She says that every time she takes it, she gets lightheaded and her BP drops into the 85I systolic.  Today, she did not take Adempas and BP 115/63.  She is stable clinically.  She is able to drive to stores though trying to limit this due to coronavirus.  She is short of breath if she walks "long distances."  No problems walking around her house.  She continues to use 4L home oxygen. Taking Lasix every other day.  Weight has slowly trended down. No palpitations.   Labs (2/18): TSH normal Labs (3/18): K 3.8, creatinine 1.26, BNP 424 Labs (5/18): K 4.4, creatinine 1.96, anti-SCL 70 negative, RF very slightly elevated, HIV negative.  Labs (6/18): K 4, creatinine  1.2, LDL 49 Labs (9/18): BNP 149, K 4, creatinine 1.4, BNP 149 Labs (2/19): BNP 126, K 3.8, creatinine 1.5, CCP+, Ro-52+, elevated CK.  Labs (7/19): BNP 676, K 4.7, creatinine 1.74 Labs (9/19): K 3.8, creatinine 1.39, BNP 220 Labs (11/19): K 3.5, creatinine 1.37, BNP 134 Labs (1/20): K 4.1, creatinine 1.18 Labs (4/20): K 4, creatinine 1.27, LFTs normal, hgb 11.7, plts 237  4/18 6 minute walk:  109 m  10/18 6 minute walk: 229 m 2/19 6 minute walk: < 100 m 5/19 6 minute walk: 107 m 1/20 6 minute walk: 188 m  PMH:  1. COPD: Quit smoking 2011.  - PFTs (3/18): minimal obstruction, minimal restriction, DLCO 22% (severely decreased).  2. Hypothyroidism 3. Hyperlipidemia 4. OSA: Uses CPAP 5. Apical hypertrophic cardiomyopathy:  - Cardiac MRI (7/16) with EF 79%, mid-apical LV hypertrophy with spade-like ventricle, mid-myocardial LGE was noted at the apex.   6. GERD 7. Atrial fibrillation: Paroxysmal.  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 Long QT interval so dofetilide and sotalol have not been used. She has had atrial fibrillation ablation.  - Currently on dronedarone.  - DCCV to NSR in ER 7/19.  8. Coronary artery disease: LHC (7/16) with nonobstructive disease + 85% ostial stenosis small D1.  9. Diastolic CHF: Echo (1/03) with EF 65-70%, apical hypertrophic cardiomyopathy, severe LAE, PASP 86 mmHg.  - RHC (3/18): mean RA 4, PA 69/23 mean 38, mean PCWP 8, CI 2.17, PVR 7.3 WU Fick, 9.6 WU thermo.  - Echo (3/19): EF 65-70%, apical hypertrophic cardiomyopathy, normal RV size and systolic function, severe LAE.  - Echo (6/19): EF 65-70%, apical HCM, restrictive diastolic dysfunction, negative bubble study, PASP 42 mmHg.  10. Pulmonary hypertension: See RHC above.  - V/Q scan negative 3/18 - HIV, SCL70 negative.  RF borderline elevated, doubt significant.  ANA negative.  - Unable to tolerate Adcirca - RHC (3/19): mean RA 6, PA 49/16 mean 29, mean PCWP 10, CI 1.9/PVR 5.3 WU Fick, CI 1.4/PVR 7.3 Thermo.  - High resolution CT chest (3/19): respiratory motion artifact present, but suspect mild emphysema with lungs grossly clear.  - P-ANCA+, CCP+, RO-52+, CK elevated.  - Echo 6/19 with negative bubble study.  - Unable to tolerate Adempas due to hypotension.  11. CPX (12/17): peak VO2 8.5, RER 0.96, VE/VCO2 slope 67. Submaximal but appears to show severe functional impairment.   12. CKD:  Stage 3.  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   Current Outpatient Medications  Medication Sig Dispense Refill   acetaminophen (TYLENOL) 325 MG tablet Take 2 tablets (650 mg total) by mouth every 4 (four) hours as needed for headache or mild pain.     apixaban (ELIQUIS) 5 MG TABS tablet Take 1 tablet (5 mg total) by mouth 2 (two) times daily. 60 tablet 1   atorvastatin (LIPITOR) 80 MG tablet Take 1 tablet (80 mg total) by mouth daily with lunch. 90 tablet 3   azaTHIOprine (IMURAN) 50 MG tablet Take 100 mg by mouth daily.     Cholecalciferol (VITAMIN D) 2000 UNITS CAPS Take 2,000 Units by mouth daily.      colchicine 0.6 MG tablet Take 0.6 mg by mouth daily.      ezetimibe (ZETIA) 10 MG tablet Take 1 tablet (10 mg total) by mouth daily. Patient overdue for appt w/Dr Turner,must call and schedule for further refills3rd/final attempt (Patient taking differently: Take 10 mg by mouth daily. ) 15 tablet 0  furosemide (LASIX) 20 MG tablet TAKE 2 TABLETS BY MOUTH EVERY DAY 180 tablet 2   ipratropium-albuterol (DUONEB) 0.5-2.5 (3) MG/3ML SOLN Take 3 mLs by nebulization 3 (three) times daily. DX: J44.9 (Patient taking differently: Take 3 mLs by nebulization 3 (three) times daily. DX: J44.9) 360 mL 5   KLOR-CON M20 20 MEQ tablet TAKE 1 TABLET BY MOUTH EVERY DAY 90 tablet 2   levothyroxine (SYNTHROID, LEVOTHROID) 75 MCG tablet Take 75 mcg by mouth daily before breakfast.      macitentan (OPSUMIT) 10 MG tablet Take 1 tablet (10 mg total) by mouth daily. 30 tablet 11   metoprolol tartrate (LOPRESSOR) 25 MG tablet Take 1/2-1 tablet by mouth every 6 hours AS NEEDED for breakthrough afib HR over 100 45 tablet 1   MULTAQ 400 MG tablet TAKE 1 TABLET (400 MG TOTAL) BY MOUTH 2 (TWO) TIMES DAILY WITH A MEAL. 180 tablet 1   Multiple Vitamin (MULTIVITAMIN) capsule Take 1 capsule by mouth daily. chewable      pantoprazole (PROTONIX) 40 MG tablet Take 1 tablet (40 mg total) by mouth daily. 90 tablet 2   Selexipag (UPTRAVI) 1600 MCG TABS Take 1,600 mcg by mouth 2 (two) times daily.      UNABLE TO FIND Med Name: CPAP  DME-[facility]     vitamin B-12 100 MCG tablet Take 1 tablet (100 mcg total) by mouth daily. 30 tablet 0   No current facility-administered medications for this encounter.     Allergies:   Amiodarone; Cellcept [mycophenolate mofetil]; and Zyban [bupropion]   Social History:  The patient  reports that she quit smoking about 9 years ago. Her smoking use included cigarettes. She has a 22.00 pack-year smoking history. She has never used smokeless tobacco. She reports that she does not drink alcohol or use drugs.   Family History:  The patient's family history includes Breast cancer in her paternal aunt; Gout in her father; Heart disease in her sister; Lung cancer in her father.   ROS:  Please see the history of present illness.   All other systems are personally reviewed and negative.   Exam:  (Video/Tele Health Call; Exam is subjective and or/visual.) 115/63, wt 178 (measured by patient) General:  Speaks in full sentences. No resp difficulty. Neck: No JVD.  Lungs: Normal respiratory effort with conversation.  Abdomen: Non-distended per patient report Extremities: Pt denies edema. Neuro: Alert & oriented x 3.   Recent Labs: 02/07/2018: B Natriuretic Peptide 134.5 08/02/2018: ALT 20; BUN 16; Creatinine, Ser 1.27; Hemoglobin 11.7; Platelets 237; Potassium 4.0; Sodium 139  Personally reviewed   Wt Readings from Last 3 Encounters:  06/15/18 82.6 kg (182 lb)  06/08/18 83.2 kg (183 lb 6.4 oz)  04/09/18 81.9 kg (180 lb 9.6 oz)      ASSESSMENT AND PLAN:  1. Chronic diastolic CHF: Echo 3/01 with EF 65-70%, normal RV size and systolic function.  Given worsening symptoms and the finding of low cardiac output on RHC, I had expected her RV to look hypokinetic. However, on 6/19 echo, the RV  looked fairly normal. Today on exam by video, she does not appear volume overloaded.  Chronic NYHA class III symptoms.   - She can to take Lasix 40 mg every other day.  Recent BMET reviewed and stable.   2. Apical hypertrophic cardiomyopathy: Cardiac MRI in 7/16 showed mid-apical LV hypertrophy with vigorous contraction. Now off metoprolol with slow HR (uses metoprolol prn tachycardia - atrial fibrillation).  ECG is consistent with apical  HCM.   3. Atrial fibrillation:  She remains on on Multaq, no palpitations.  Per Dr Rayann Heman, would not re-do her ablation. Breakthrough atrial fibrillation in 7/19, DCCV back to NSR in ER.   - Continue Eliquis.  No further overt GI bleeding.  Recent CBC reviewed and stable.   - Continue dronedarone.  She does have CHF/volume overload but will continue dronedarone for now unless CHF becomes difficult to manage. She is not a candidate for redo ablation and does not have other anti-arrhythmic options per Dr. Rayann Heman.  4. Pulmonary hypertension: RHC in 3/18 showed moderate pulmonary arterial hypertension with elevated PVR (7.3 WU by Fick, 9.6 WU by thermo). She has COPD but I do not think this plays a large role in the pulmonary hypertension =>  PFTs in 3/18 showed minimal obstruction and restriction, there was significantly decreased DLCO, this may be due to pulmonary vascular disease.  OSA may play a role but not a large one. V/Q scan did not suggest chronic PEs in 3/18.  RF borderline elevated (probably not significant), HIV negative, Anti-SCL70 negative, ANA negative, P-ANCA weakly positive.  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  Her RHC in 3/19 showed lower PA pressure and PVR with normal right and left heart filling pressures, but cardiac output was lower than prior.  Surprisingly, her RV looked normal on echo so I question how accurate the cardiac output measurement really was.  Bubble study negative on echo.  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  - Also of note, she has a history of afib ablation so pulmonary vein stenosis certainly remains a concern for raising PA pressure, but I think this is unlikely as the cause for her presentation.   - Continue Opsumit.     - She did not tolerate Adcirca or Adempas.  I am going to have her stop Adempas.  - Continue Selexipag 1600 bid.  - She will need repeat echo at 2 months at followup.  5. Anti-synthetase syndrome: Elevated CK, positive myositis panel, nonspecific interstitial pneumonia. Being seen by rheumatology at [facility], on azathioprine now.   COVID screen The patient does not have any symptoms that suggest any further testing/ screening at this time.  Social distancing reinforced today.  Patient Risk: After full review of this patients clinical status, I feel that they are at moderate risk for cardiac decompensation at this time.  Relevant cardiac medications were reviewed at length with the patient today. The patient does not have concerns regarding their medications at this time.   Recommended follow-up:  2 months with echo.   Today, I have spent 18 minutes with the patient with telehealth technology discussing the above issues .    Signed, [name], MD  08/03/2018 4:38 PM  [facility] [address] [facility] [place] [address] [phone] (office) [phone] (fax)

## 2018-08-03 NOTE — Progress Notes (Signed)
Reviewed after visit summary with pt. No questions at this time. avs sent via mychart

## 2018-08-12 DIAGNOSIS — I270 Primary pulmonary hypertension: Secondary | ICD-10-CM | POA: Diagnosis not present

## 2018-08-18 ENCOUNTER — Telehealth: Payer: Self-pay | Admitting: Pulmonary Disease

## 2018-08-18 NOTE — Telephone Encounter (Signed)
Returned call to patient Made aware there are pending labs just like last month. Covid screen neg She asked that results be faxed to Dr. Sherrian Divers @ [facility] informed they should be able to view via epic as we can view his notes. Reminded patient to wear her mask. Nothing further needed.

## 2018-08-19 ENCOUNTER — Other Ambulatory Visit (INDEPENDENT_AMBULATORY_CARE_PROVIDER_SITE_OTHER): Payer: Medicare Other

## 2018-08-19 DIAGNOSIS — Z5181 Encounter for therapeutic drug level monitoring: Secondary | ICD-10-CM | POA: Diagnosis not present

## 2018-08-19 LAB — CBC WITH DIFFERENTIAL/PLATELET
Basophils Absolute: 0.1 10*3/uL (ref 0.0–0.1)
Basophils Relative: 0.9 % (ref 0.0–3.0)
Eosinophils Absolute: 0.2 10*3/uL (ref 0.0–0.7)
Eosinophils Relative: 2.5 % (ref 0.0–5.0)
HCT: 36.3 % (ref 36.0–46.0)
Hemoglobin: 11.8 g/dL — ABNORMAL LOW (ref 12.0–15.0)
Lymphocytes Relative: 11.9 % — ABNORMAL LOW (ref 12.0–46.0)
Lymphs Abs: 0.8 10*3/uL (ref 0.7–4.0)
MCHC: 32.5 g/dL (ref 30.0–36.0)
MCV: 90.9 fl (ref 78.0–100.0)
Monocytes Absolute: 0.7 10*3/uL (ref 0.1–1.0)
Monocytes Relative: 10.3 % (ref 3.0–12.0)
Neutro Abs: 5 10*3/uL (ref 1.4–7.7)
Neutrophils Relative %: 74.4 % (ref 43.0–77.0)
Platelets: 291 10*3/uL (ref 150.0–400.0)
RBC: 4 Mil/uL (ref 3.87–5.11)
RDW: 16.5 % — ABNORMAL HIGH (ref 11.5–15.5)
WBC: 6.7 10*3/uL (ref 4.0–10.5)

## 2018-08-19 LAB — HEPATIC FUNCTION PANEL
ALT: 12 U/L (ref 0–35)
AST: 14 U/L (ref 0–37)
Albumin: 3.8 g/dL (ref 3.5–5.2)
Alkaline Phosphatase: 79 U/L (ref 39–117)
Bilirubin, Direct: 0.1 mg/dL (ref 0.0–0.3)
Total Bilirubin: 0.5 mg/dL (ref 0.2–1.2)
Total Protein: 6.6 g/dL (ref 6.0–8.3)

## 2018-08-19 LAB — CREATININE, SERUM: Creatinine, Ser: 1.12 mg/dL (ref 0.40–1.20)

## 2018-08-19 LAB — ALT: ALT: 12 U/L (ref 0–35)

## 2018-08-24 ENCOUNTER — Telehealth (HOSPITAL_COMMUNITY): Payer: Medicare Other | Admitting: Nurse Practitioner

## 2018-08-24 ENCOUNTER — Ambulatory Visit (HOSPITAL_COMMUNITY)
Admission: RE | Admit: 2018-08-24 | Discharge: 2018-08-24 | Disposition: A | Discharge status: Home / Self Care | Payer: Medicare Other | Source: Ambulatory Visit | Attending: Nurse Practitioner | Admitting: Nurse Practitioner

## 2018-08-24 ENCOUNTER — Telehealth (HOSPITAL_COMMUNITY): Payer: Self-pay | Admitting: *Deleted

## 2018-08-24 ENCOUNTER — Other Ambulatory Visit: Payer: Self-pay

## 2018-08-24 DIAGNOSIS — I480 Paroxysmal atrial fibrillation: Secondary | ICD-10-CM | POA: Diagnosis not present

## 2018-08-24 NOTE — Telephone Encounter (Signed)
BP 93/77 and HR 127 at around 10 today.    HR now 67 sitting up.  When patient lays down, the HR goes up.  Pt reports swelling bilateral in ankles and left foot as well.  Pt did not take diuretic today

## 2018-08-24 NOTE — Progress Notes (Signed)
Electrophysiology TeleHealth Note   Due to national recommendations of social distancing due to [place] 19, Audio/video  telehealth visit is felt to be most appropriate for this patient at this time.  See MyChart message/consent below  from today for patient consent regarding telehealth for the [facility].    Date:  08/24/2018   ID:  Amy [name], DOB [birth year], MRN [id number]  Location: home   Provider location: [address] [place], [place] [address] Evaluation Performed:Follow up  PCP:  Rankins, Bill Salinas, MD  Primary Cardiologist:  Dr. Aundra Dubin Primary Electrophysiologist: Dr. Rayann Heman  CC:" I have been in  afib for over 24 hours."   History of Present Illness: Amy [name] is a 73 y.o. female who presents via audio/video conferencing for a telehealth visit today.   The patient is established with Dr. Rayann Heman and the afib clinic. She has h/o of   COPD, OSA on CPAP, apical hypertrophic cardiomyopathy, pulmonary hypertension, chronic diastolic CHF, and paroxysmal atrial fibrillation.  She is currently taking dronedarone but has had breakthrough fibrillation.She had an atrial fibrillation ablation in the past.  She called the office this am c/o of afib since 2 am  early Tuesday am.  She said at times she would see it in the 60's but other times with walking the rate would increase into the 100's. The only change in her health is that she took some on hand prednisone for her back pain for the last 4 days. Her HR was elevated when she first got up this am. She took her am meds including metoprolol but held her lasix. As her BP is low at 90 systolic. She often runs a low BP. Her weight is up around a lb but she has been taking prednisone. Mild increase in LLE. She is chronically short of breath but felt it more yesterday. While we were taking on the phone she noted by her fitbit that her HR's were in the 60's and regular. I asked her to get up and walk around  . She did and her HR stayed regular and did not not escalate. We had been discussing an elective vrs urgent ER cardioversion but it sounds like she is in regular rhythm now so neither would be indicated. Her weight this am is 181 lbs, states her usual is 180 lbs.  Today, she denies symptoms of   chest pain,   orthopnea, PND, mild lower extremity edema, claudication, dizziness, presyncope, syncope, bleeding, or neurologic sequela. The patient is tolerating medications without difficulties and is otherwise without complaint today.   she denies symptoms of cough, fevers, chills, or new SOB worrisome for COVID 19.    she has a BMI of There is no height or weight on file to calculate BMI.. There were no vitals filed for this visit.  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 [facility] in 10/2011 demonstrated non-obstructive disease and an 80% lesion in a small D1 treated medically.    CHF (congestive heart failure) (H[facility])    Chronic diastolic heart failure (H[facility])    CKD (chronic kidney disease), stage III (HCC)    COPD (chronic obstructive pulmonary disease) (H[facility])    Dizziness    had PT for being off balance - in approx. 2012  GERD (gastroesophageal reflux disease)    Hyperlipidemia    Hypertension    Hypothyroid    Lumbar disc disease    Macular degeneration    OSA (obstructive sleep apnea)    Persistent atrial fibrillation        Pulmonary hypertension ([place])    a. [place] 05/2013 showed mild PAH with normal PCWP and RA pressure, could be related to OSA and low oxygen saturation.    Refusal of blood transfusions as patient is Jehovah's Witness    Sinus bradycardia    Past Surgical History:  Procedure Laterality Date   BIOPSY  12/03/2017   Procedure: BIOPSY;  Surgeon: Otis Brace, MD;   Location: [facility];  Service: Gastroenterology;;   CARDIAC CATHETERIZATION     normal coronary arteries   CARDIAC CATHETERIZATION N/A 10/31/2014   Procedure: Left Heart Cath and Coronary Angiography;  Surgeon: Leonie Man, MD;  Location: [facility];  Service: Cardiovascular;  Laterality: N/A;   CATARACT EXTRACTION Bilateral    CESAREAN SECTION  1983   COLONOSCOPY WITH PROPOFOL N/A 12/03/2017   Procedure: COLONOSCOPY WITH PROPOFOL;  Surgeon: Otis Brace, MD;  Location: [facility];  Service: Gastroenterology;  Laterality: N/A;   ELECTROPHYSIOLOGIC STUDY N/A 04/24/2015   Procedure: Atrial Fibrillation Ablation;  Surgeon: [name], MD;  Location: [facility];  Service: Cardiovascular;  Laterality: N/A;   FLEXIBLE SIGMOIDOSCOPY N/A 11/30/2017   Procedure: FLEXIBLE SIGMOIDOSCOPY;  Surgeon: Otis Brace, MD;  Location: [facility];  Service: Gastroenterology;  Laterality: N/A;   RIGHT HEART CATH N/A 06/25/2016   Procedure: Right Heart Cath;  Surgeon: Sherren Mocha, MD;  Location: [facility];  Service: Cardiovascular;  Laterality: N/A;   RIGHT HEART CATH N/A 06/10/2017   Procedure: RIGHT HEART CATH;  Surgeon: Larey Dresser, MD;  Location: [facility];  Service: Cardiovascular;  Laterality: N/A;   RIGHT HEART CATHETERIZATION N/A 06/01/2013   Procedure: RIGHT HEART CATH;  Surgeon: Larey Dresser, MD;  [facility];  Service: Cardiovascular;  Laterality: N/A;   TEE WITHOUT CARDIOVERSION N/A 04/23/2015   Procedure: TRANSESOPHAGEAL ECHOCARDIOGRAM (TEE);  Surgeon: Josue Hector, MD;  Location: [facility];  Service: Cardiovascular;  Laterality: N/A;     Current Outpatient Medications  Medication Sig Dispense Refill   acetaminophen (TYLENOL) 325 MG tablet Take 2 tablets (650 mg total) by mouth every 4 (four) hours as needed for headache or mild pain.     apixaban (ELIQUIS) 5 MG TABS tablet Take 1 tablet (5 mg total) by mouth 2  (two) times daily. 60 tablet 1   atorvastatin (LIPITOR) 80 MG tablet Take 1 tablet (80 mg total) by mouth daily with lunch. 90 tablet 3   azaTHIOprine (IMURAN) 50 MG tablet Take 100 mg by mouth daily.     Cholecalciferol (VITAMIN D) 2000 UNITS CAPS Take 2,000 Units by mouth daily.      ezetimibe (ZETIA) 10 MG tablet Take 1 tablet (10 mg total) by mouth daily. Patient overdue for appt w/Dr Turner,must call and schedule for further refills3rd/final attempt (Patient taking differently: Take 10 mg by mouth daily. ) 15 tablet 0   furosemide (LASIX) 20 MG tablet Take 20 mg by mouth as needed.     KLOR-CON M20 20 MEQ tablet TAKE 1 TABLET BY MOUTH EVERY DAY (Patient taking differently: Take 20 mEq by mouth as needed. ) 90 tablet 2   levothyroxine (SYNTHROID, LEVOTHROID) 75 MCG tablet Take 75 mcg by mouth daily before  breakfast.      macitentan (OPSUMIT) 10 MG tablet Take 1 tablet (10 mg total) by mouth daily. 30 tablet 11   metoprolol tartrate (LOPRESSOR) 25 MG tablet Take 1/2-1 tablet by mouth every 6 hours AS NEEDED for breakthrough afib HR over 100 45 tablet 1   MULTAQ 400 MG tablet TAKE 1 TABLET (400 MG TOTAL) BY MOUTH 2 (TWO) TIMES DAILY WITH A MEAL. 180 tablet 1   Multiple Vitamin (MULTIVITAMIN) capsule Take 1 capsule by mouth daily. chewable     pantoprazole (PROTONIX) 40 MG tablet Take 1 tablet (40 mg total) by mouth daily. 90 tablet 2   Selexipag (UPTRAVI) 1600 MCG TABS Take 1,600 mcg by mouth 2 (two) times daily.      UNABLE TO FIND Med Name: CPAP  [facility]     vitamin B-12 100 MCG tablet Take 1 tablet (100 mcg total) by mouth daily. 30 tablet 0   No current facility-administered medications for this encounter.     Allergies:   Amiodarone; Cellcept [mycophenolate mofetil]; and Zyban [bupropion]   Social History:  The patient  reports that she quit smoking about 9 years ago. Her smoking use included cigarettes. She has a 22.00 pack-year smoking history. She has never used  smokeless tobacco. She reports that she does not drink alcohol or use drugs.   Family History:  The patient's  family history includes Breast cancer in her paternal aunt; Gout in her father; Heart disease in her sister; Lung cancer in her father.    ROS:  Please see the history of present illness.   All other systems are personally reviewed and negative.   Exam: Well appearing, alert and conversant, regular work of breathing,  good skin color . Wearing O2 via nasal cannula.  Recent Labs: 02/07/2018: B Natriuretic Peptide 134.5 08/02/2018: BUN 16; Potassium 4.0; Sodium 139 08/19/2018: ALT 12; ALT 12; Creatinine, Ser 1.12; Hemoglobin 11.8; Platelets 291.0  personally reviewed    Other studies personally reviewed: Epic records reviewed.     ASSESSMENT AND PLAN:  1.  Paroxysmal  atrial fibrillation It appears that pt has had issues with afib since early yesterday am Now on phone with me, her HR is regular in the 60's even when she go up and walked around. Her recent prednisone use may have aggravated  PAF Recommended to avoid prednisone if able I cannot increase rate control as she has soft blood pressures Therefore, she will continue to monitor today and we will have another visit tomorrow at 2pm Continue BB/ multaq without change Continue  eliquis 5 mg bid  This patients CHA2DS2-VASc Score and unadjusted Ischemic Stroke Rate (% per year) is equal to 9.7 % stroke rate/year from a score of 6  Above score calculated as 1 point each if present [CHF, HTN, DM, Vascular=MI/PAD/Aortic Plaque, Age if 65-74, or Female] Above score calculated as 2 points each if present [Age > 75, or Stroke/TIA/TE]   COVID screen The patient does not have any symptoms that suggest any further testing/ screening at this time.  Social distancing reinforced today.    Follow-up:   Tele health visit tomorrow at 2 pm  Current medicines are reviewed at length with the patient today.   The patient does not  have concerns regarding her medicines.  The following changes were made today:  none  Labs/ tests ordered today include: none No orders of the defined types were placed in this encounter.   Patient Risk:  after full review of this  patients clinical status, I feel that they are at  high risk at this time.   Today, I have spent  15   minutes with the patient with telehealth technology discussing afib  Signed, Roderic Palau NP  08/24/2018 1:03 PM  Afib [facility] [place], [place] [address] [phone]   I hereby voluntarily request, consent and authorize the [facility] and its employed or contracted physicians, physician assistants, nurse practitioners or other licensed health care professionals (the Practitioner), to provide me with telemedicine health care services (the "Services") as deemed necessary by the treating Practitioner. I acknowledge and consent to receive the Services by the Practitioner via telemedicine. I understand that the telemedicine visit will involve communicating with the Practitioner through live audiovisual communication technology and the disclosure of certain medical information by electronic transmission. I acknowledge that I have been given the opportunity to request an in-person assessment or other available alternative prior to the telemedicine visit and am voluntarily participating in the telemedicine visit.   I understand that I have the right to withhold or withdraw my consent to the use of telemedicine in the course of my care at any time, without affecting my right to future care or treatment, and that the Practitioner or I may terminate the telemedicine visit at any time. I understand that I have the right to inspect all information obtained and/or recorded in the course of the telemedicine visit and may receive copies of available information for a reasonable fee.  I understand that some of the potential risks  of receiving the Services via telemedicine include:   Delay or interruption in medical evaluation due to technological equipment failure or disruption;  Information transmitted may not be sufficient (e.g. poor resolution of images) to allow for appropriate medical decision making by the Practitioner; and/or  In rare instances, security protocols could fail, causing a breach of personal health information.   Furthermore, I acknowledge that it is my responsibility to provide information about my medical history, conditions and care that is complete and accurate to the best of my ability. I acknowledge that Practitioner's advice, recommendations, and/or decision may be based on factors not within their control, such as incomplete or inaccurate data provided by me or distortions of diagnostic images or specimens that may result from electronic transmissions. I understand that the practice of medicine is not an exact science and that Practitioner makes no warranties or guarantees regarding treatment outcomes. I acknowledge that I will receive a copy of this consent concurrently upon execution via email to the email address I last provided but may also request a printed copy by calling the office of the [facility].  I understand that my insurance will be billed for this visit.   I have read or had this consent read to me.  I understand the contents of this consent, which adequately explains the benefits and risks of the Services being provided via telemedicine.  I have been provided ample opportunity to ask questions regarding this consent and the Services and have had my questions answered to my satisfaction.  I give my informed consent for the services to be provided through the use of telemedicine in my medical care  By participating in this telemedicine visit I agree to the above.

## 2018-08-25 ENCOUNTER — Emergency Department (HOSPITAL_COMMUNITY)
Admission: EM | Admit: 2018-08-25 | Discharge: 2018-08-25 | Disposition: A | Discharge status: Home / Self Care | Payer: Medicare Other | Attending: Emergency Medicine | Admitting: Emergency Medicine

## 2018-08-25 ENCOUNTER — Other Ambulatory Visit: Payer: Self-pay

## 2018-08-25 ENCOUNTER — Ambulatory Visit (HOSPITAL_BASED_OUTPATIENT_CLINIC_OR_DEPARTMENT_OTHER)
Admission: RE | Admit: 2018-08-25 | Discharge: 2018-08-25 | Disposition: A | Discharge status: Home / Self Care | Payer: Medicare Other | Source: Ambulatory Visit | Attending: Nurse Practitioner | Admitting: Nurse Practitioner

## 2018-08-25 ENCOUNTER — Encounter (HOSPITAL_COMMUNITY): Payer: Self-pay | Admitting: Nurse Practitioner

## 2018-08-25 ENCOUNTER — Emergency Department (HOSPITAL_COMMUNITY): Payer: Medicare Other

## 2018-08-25 ENCOUNTER — Other Ambulatory Visit: Payer: Self-pay | Admitting: *Deleted

## 2018-08-25 VITALS — BP 94/74 | HR 134 | Ht 62.0 in

## 2018-08-25 DIAGNOSIS — I5032 Chronic diastolic (congestive) heart failure: Secondary | ICD-10-CM | POA: Insufficient documentation

## 2018-08-25 DIAGNOSIS — I4891 Unspecified atrial fibrillation: Secondary | ICD-10-CM | POA: Diagnosis not present

## 2018-08-25 DIAGNOSIS — N183 Chronic kidney disease, stage 3 (moderate): Secondary | ICD-10-CM | POA: Diagnosis not present

## 2018-08-25 DIAGNOSIS — Z79899 Other long term (current) drug therapy: Secondary | ICD-10-CM | POA: Diagnosis not present

## 2018-08-25 DIAGNOSIS — R52 Pain, unspecified: Secondary | ICD-10-CM | POA: Diagnosis not present

## 2018-08-25 DIAGNOSIS — I4892 Unspecified atrial flutter: Secondary | ICD-10-CM | POA: Diagnosis not present

## 2018-08-25 DIAGNOSIS — E039 Hypothyroidism, unspecified: Secondary | ICD-10-CM | POA: Insufficient documentation

## 2018-08-25 DIAGNOSIS — R002 Palpitations: Secondary | ICD-10-CM | POA: Diagnosis present

## 2018-08-25 DIAGNOSIS — Z87891 Personal history of nicotine dependence: Secondary | ICD-10-CM | POA: Diagnosis not present

## 2018-08-25 DIAGNOSIS — J449 Chronic obstructive pulmonary disease, unspecified: Secondary | ICD-10-CM | POA: Insufficient documentation

## 2018-08-25 DIAGNOSIS — I252 Old myocardial infarction: Secondary | ICD-10-CM | POA: Diagnosis not present

## 2018-08-25 DIAGNOSIS — I130 Hypertensive heart and chronic kidney disease with heart failure and stage 1 through stage 4 chronic kidney disease, or unspecified chronic kidney disease: Secondary | ICD-10-CM | POA: Diagnosis not present

## 2018-08-25 DIAGNOSIS — R0602 Shortness of breath: Secondary | ICD-10-CM | POA: Diagnosis not present

## 2018-08-25 DIAGNOSIS — Z7901 Long term (current) use of anticoagulants: Secondary | ICD-10-CM | POA: Insufficient documentation

## 2018-08-25 DIAGNOSIS — I4819 Other persistent atrial fibrillation: Secondary | ICD-10-CM | POA: Diagnosis not present

## 2018-08-25 DIAGNOSIS — M5489 Other dorsalgia: Secondary | ICD-10-CM | POA: Diagnosis not present

## 2018-08-25 DIAGNOSIS — R0902 Hypoxemia: Secondary | ICD-10-CM | POA: Diagnosis not present

## 2018-08-25 LAB — COMPREHENSIVE METABOLIC PANEL
ALT: 58 U/L — ABNORMAL HIGH (ref 0–44)
AST: 39 U/L (ref 15–41)
Albumin: 3.4 g/dL — ABNORMAL LOW (ref 3.5–5.0)
Alkaline Phosphatase: 71 U/L (ref 38–126)
Anion gap: 11 (ref 5–15)
BUN: 20 mg/dL (ref 8–23)
CO2: 21 mmol/L — ABNORMAL LOW (ref 22–32)
Calcium: 8.6 mg/dL — ABNORMAL LOW (ref 8.9–10.3)
Chloride: 108 mmol/L (ref 98–111)
Creatinine, Ser: 1.11 mg/dL — ABNORMAL HIGH (ref 0.44–1.00)
GFR calc Af Amer: 57 mL/min — ABNORMAL LOW (ref >60)
GFR calc non Af Amer: 49 mL/min — ABNORMAL LOW (ref >60)
Glucose, Bld: 87 mg/dL (ref 70–99)
Potassium: 4.2 mmol/L (ref 3.5–5.1)
Sodium: 140 mmol/L (ref 135–145)
Total Bilirubin: 0.5 mg/dL (ref 0.3–1.2)
Total Protein: 6.4 g/dL — ABNORMAL LOW (ref 6.5–8.1)

## 2018-08-25 LAB — TROPONIN I: Troponin I: 0.06 ng/mL (ref <0.03)

## 2018-08-25 LAB — CBC WITH DIFFERENTIAL/PLATELET
Abs Immature Granulocytes: 0.1 10*3/uL — ABNORMAL HIGH (ref 0.00–0.07)
Basophils Absolute: 0 10*3/uL (ref 0.0–0.1)
Basophils Relative: 0 %
Eosinophils Absolute: 0.2 10*3/uL (ref 0.0–0.5)
Eosinophils Relative: 2 %
HCT: 41.2 % (ref 36.0–46.0)
Hemoglobin: 12.7 g/dL (ref 12.0–15.0)
Immature Granulocytes: 1 %
Lymphocytes Relative: 19 %
Lymphs Abs: 2 10*3/uL (ref 0.7–4.0)
MCH: 28.9 pg (ref 26.0–34.0)
MCHC: 30.8 g/dL (ref 30.0–36.0)
MCV: 93.8 fL (ref 80.0–100.0)
Monocytes Absolute: 0.8 10*3/uL (ref 0.1–1.0)
Monocytes Relative: 8 %
Neutro Abs: 7.5 10*3/uL (ref 1.7–7.7)
Neutrophils Relative %: 70 %
Platelets: 308 10*3/uL (ref 150–400)
RBC: 4.39 MIL/uL (ref 3.87–5.11)
RDW: 16.7 % — ABNORMAL HIGH (ref 11.5–15.5)
WBC: 10.6 10*3/uL — ABNORMAL HIGH (ref 4.0–10.5)
nRBC: 0 % (ref 0.0–0.2)

## 2018-08-25 LAB — PROTIME-INR
INR: 1.2 (ref 0.8–1.2)
Prothrombin Time: 15.2 seconds (ref 11.4–15.2)

## 2018-08-25 MED ORDER — PROPOFOL 10 MG/ML IV BOLUS
INTRAVENOUS | Status: AC | PRN
  Administered 2018-08-25: 40 mg via INTRAVENOUS

## 2018-08-25 MED ORDER — PROPOFOL 10 MG/ML IV BOLUS
0.5000 mg/kg | Freq: Once | INTRAVENOUS | Status: DC
  Filled 2018-08-25: qty 20

## 2018-08-25 NOTE — Sedation Documentation (Signed)
Syncronized cardioversion 200J; Heart rate 60 SR; post. EKG exported.

## 2018-08-25 NOTE — Sedation Documentation (Signed)
Daughter on speaker phone; updated on patient's status; answered all questions. Pt will do PO challenge prior to DC. Pt alert and oriented x4.

## 2018-08-25 NOTE — ED Provider Notes (Signed)
[facility] EMERGENCY DEPARTMENT Provider Note   CSN: [id number] Arrival date & time: 08/25/18  1548    History   Chief Complaint Chief Complaint  Patient presents with  . Palpitations    HPI Amy [name] is a 73 y.o. female.  With a past medical history of a flutter (on Eliquis, Multitak and metoprolol as needed) CAD, CHF, CKD, COPD, pulmonary HTN, HTN, hypothyroidism who presents with 2 days of rapid heart rate.  Patient states over the past 2 days she has felt her heart rate increase.  States she was initially able to control with metoprolol however this did not last. Has been in touch with the afib clinic who she states felt uncomfortable making med changes over the phone given her borderline hypotensive. After not being able to control it she presents here. She denies any chest pain. Endorses some mild SOB with exertion.     HPI  Past Medical History:  Diagnosis Date  . 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 Apical variant hypertrophic cardiomyopathy ([place])    Diagnosed by echo and ECG 11/13  . Atypical atrial flutter ([place])   . CAD (coronary artery disease)    a. LHC in 10/2011 demonstrated non-obstructive disease and an 80% lesion in a small D1 treated medically.   . CHF (congestive heart failure) ([place])   . Chronic diastolic heart failure ([place])   . CKD (chronic kidney disease), stage III ([place])   . COPD (chronic obstructive pulmonary disease) ([place])   . Dizziness    had PT for being off balance - in approx. 2012  . GERD (gastroesophageal reflux disease)   . Hyperlipidemia   . Hypertension   . Hypothyroid   . Lumbar disc disease   . Macular degeneration   . OSA (obstructive sleep apnea)   . Persistent atrial fibrillation       . Pulmonary hypertension ([place])    a. [place] 05/2013 showed mild PAH with normal PCWP and RA pressure, could be related to OSA and low oxygen saturation.   . Refusal of blood transfusions as patient is Jehovah's Witness   .  Sinus bradycardia     Patient Active Problem List   Diagnosis Date Noted  . 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 GIB (gastrointestinal bleeding) 11/29/2017  . Congestive heart failure ([place])   . Chronic respiratory failure with hypoxia ([place]) 05/29/2017  . Paroxysmal atrial fibrillation with rapid ventricular response ([place]) 10/01/2016  . PAF (paroxysmal atrial fibrillation) ([place]) 10/01/2016  . Dyspnea   . Pulmonary hypertension, primary ([place])   . Atrial flutter with rapid ventricular response ([place])   . Elevated troponin   . NSTEMI (non-ST elevated myocardial infarction) ([place]) 10/29/2014  . Chest pain 10/28/2014  . Atrial flutter with RVR 10/28/2014  . Morbid obesity due to excess calories ([place]) 07/19/2014  . Chronic anticoagulation 07/19/2014  . CAD (coronary artery disease) 07/19/2014  . Chronic renal insufficiency, stage III (moderate) ([place]) 07/19/2014  . Dyslipidemia 04/12/2014  . Hypoxia 04/25/2013  . OSA (obstructive sleep apnea) 04/13/2013  . Apical variant hypertrophic cardiomyopathy ([place]) 03/05/2012  . Chronic diastolic heart failure ([place])   . Persistent atrial fibrillation ([place])   . Sinus bradycardia   . Asthmatic bronchitis , chronic ([place]) 03/04/2012  . Hypothyroidism 03/04/2012  . Essential hypertension 03/04/2012  . DOE (dyspnea on exertion) 12/24/2010    Past Surgical History:  Procedure Laterality Date  . BIOPSY  12/03/2017   Procedure: BIOPSY;  Surgeon: Otis Brace, MD;  Location:  [facility];  Service: Gastroenterology;;  . CARDIAC CATHETERIZATION     normal coronary arteries  . CARDIAC CATHETERIZATION N/A 10/31/2014   Procedure: Left Heart Cath and Coronary Angiography;  Surgeon: Leonie Man, MD;  Location: [facility] CV LAB;  Service: Cardiovascular;  Laterality: N/A;  . CATARACT EXTRACTION Bilateral   . CESAREAN SECTION  1983  . COLONOSCOPY WITH PROPOFOL N/A 12/03/2017   Procedure: COLONOSCOPY WITH PROPOFOL;  Surgeon: Otis Brace, MD;   Location: [facility];  Service: Gastroenterology;  Laterality: N/A;  . ELECTROPHYSIOLOGIC STUDY N/A 04/24/2015   Procedure: Atrial Fibrillation Ablation;  Surgeon: [name], MD;  Location: [facility] CV LAB;  Service: Cardiovascular;  Laterality: N/A;  . FLEXIBLE SIGMOIDOSCOPY N/A 11/30/2017   Procedure: FLEXIBLE SIGMOIDOSCOPY;  Surgeon: Otis Brace, MD;  Location: [facility];  Service: Gastroenterology;  Laterality: N/A;  . RIGHT HEART CATH N/A 06/25/2016   Procedure: Right Heart Cath;  Surgeon: Sherren Mocha, MD;  Location: [facility] CV LAB;  Service: Cardiovascular;  Laterality: N/A;  . RIGHT HEART CATH N/A 06/10/2017   Procedure: RIGHT HEART CATH;  Surgeon: Larey Dresser, MD;  Location: [facility] CV LAB;  Service: Cardiovascular;  Laterality: N/A;  . RIGHT HEART CATHETERIZATION N/A 06/01/2013   Procedure: RIGHT HEART CATH;  Surgeon: Larey Dresser, MD;  Location: [facility] CATH LAB;  Service: Cardiovascular;  Laterality: N/A;  . TEE WITHOUT CARDIOVERSION N/A 04/23/2015   Procedure: TRANSESOPHAGEAL ECHOCARDIOGRAM (TEE);  Surgeon: Josue Hector, MD;  Location[facility] ENDOSCOPY;  Service: Cardiovascular;  Laterality: N/A;     OB History   No obstetric history on file.      Home Medications    Prior to Admission medications   Medication Sig Start Date End Date Taking? Authorizing Provider  acetaminophen (TYLENOL) 325 MG tablet Take 2 tablets (650 mg total) by mouth every 4 (four) hours as needed for headache or mild pain. 10/02/16   Erlene Quan, PA-C  apixaban (ELIQUIS) 5 MG TABS tablet Take 1 tablet (5 mg total) by mouth 2 (two) times daily. 12/08/17   Regalado, Belkys A, MD  atorvastatin (LIPITOR) 80 MG tablet Take 1 tablet (80 mg total) by mouth daily with lunch. 02/08/15   Sherran Needs, NP  azaTHIOprine (IMURAN) 50 MG tablet Take 100 mg by mouth daily.    [provider]  Cholecalciferol (VITAMIN D) 2000 UNITS CAPS Take 2,000 Units by mouth daily.     [provider]  ezetimibe (ZETIA) 10 MG tablet Take 1 tablet (10 mg total) by mouth daily. Patient overdue for appt w/Dr Turner,must call and schedule for further refills3rd/final attempt Patient taking differently: Take 10 mg by mouth daily.  12/18/17   Sueanne Margarita, MD  furosemide (LASIX) 20 MG tablet Take 20 mg by mouth as needed.    [provider]  KLOR-CON M20 20 MEQ tablet TAKE 1 TABLET BY MOUTH EVERY DAY Patient taking differently: Take 20 mEq by mouth as needed.  06/28/18   Larey Dresser, MD  levothyroxine (SYNTHROID, LEVOTHROID) 75 MCG tablet Take 75 mcg by mouth daily before breakfast.     [provider]  macitentan (OPSUMIT) 10 MG tablet Take 1 tablet (10 mg total) by mouth daily. 06/07/18   Larey Dresser, MD  metoprolol tartrate (LOPRESSOR) 25 MG tablet Take 1/2-1 tablet by mouth every 6 hours AS NEEDED for breakthrough afib HR over 100 02/10/18   Sherran Needs, NP  MULTAQ 400 MG tablet TAKE 1  TABLET (400 MG TOTAL) BY MOUTH 2 (TWO) TIMES DAILY WITH A MEAL. 05/07/18   Sherran Needs, NP  Multiple Vitamin (MULTIVITAMIN) capsule Take 1 capsule by mouth daily. chewable    [provider]  pantoprazole (PROTONIX) 40 MG tablet Take 1 tablet (40 mg total) by mouth daily. 08/05/17   Larey Dresser, MD  Selexipag (UPTRAVI) 1600 MCG TABS Take 1,600 mcg by mouth 2 (two) times daily.     [provider]  UNABLE TO FIND Med Name: CPAP  [facility]    [provider]  vitamin B-12 100 MCG tablet Take 1 tablet (100 mcg total) by mouth daily. 12/08/17   Regalado, Cassie Freer, MD    Family History Family History  Problem Relation Age of Onset  . Heart disease Sister        Good Pastures Syndrome  . Gout Father   . Lung cancer Father        smoked  . Breast cancer Paternal Aunt     Social History Social History   Tobacco Use  . Smoking status: Former Smoker    Packs/day: 0.50    Years: 44.00    Pack years: 22.00    Types: Cigarettes     Last attempt to quit: 04/07/2009    Years since quitting: 9.3  . Smokeless tobacco: Never Used  Substance Use Topics  . Alcohol use: No  . Drug use: No     Allergies   Amiodarone; Cellcept [mycophenolate mofetil]; and Zyban [bupropion]   Review of Systems Review of Systems  Constitutional: Negative for chills and fever.  HENT: Negative for ear pain and sore throat.   Eyes: Negative for pain and visual disturbance.  Respiratory: Positive for shortness of breath. Negative for cough.   Cardiovascular: Negative for chest pain, palpitations and leg swelling.  Gastrointestinal: Negative for abdominal pain, diarrhea, nausea and vomiting.  Genitourinary: Negative for dysuria and hematuria.  Musculoskeletal: Negative for arthralgias and back pain.  Skin: Negative for color change and rash.  Neurological: Negative for seizures, syncope and headaches.  All other systems reviewed and are negative.    Physical Exam Updated Vital Signs BP 104/70 (BP Location: Right Arm)   Pulse (!) 55   Temp 97.7 F (36.5 C) (Oral)   Resp 18   Ht _0  (1.6 m)   Wt 81.6 kg   SpO2 100%   BMI 31.89 kg/m   Physical Exam Vitals signs and nursing note reviewed.  Constitutional:      General: She is not in acute distress.    Appearance: She is well-developed. She is not ill-appearing.  HENT:     Head: Normocephalic and atraumatic.  Eyes:     Conjunctiva/sclera: Conjunctivae normal.  Neck:     Musculoskeletal: Neck supple.  Cardiovascular:     Rate and Rhythm: Regular rhythm. Tachycardia present.     Heart sounds: No murmur.  Pulmonary:     Effort: Pulmonary effort is normal. No respiratory distress.     Breath sounds: Normal breath sounds.  Abdominal:     Palpations: Abdomen is soft.     Tenderness: There is no abdominal tenderness.  Musculoskeletal:     Right lower leg: No edema.     Left lower leg: No edema.  Skin:    General: Skin is warm and dry.  Neurological:     Mental Status: She  is alert and oriented to person, place, and time.      ED Treatments /  Results  Labs (all labs ordered are listed, but only abnormal results are displayed) Labs Reviewed  CBC WITH DIFFERENTIAL/PLATELET - Abnormal; Notable for the following components:      Result Value   WBC 10.6 (*)    RDW 16.7 (*)    Abs Immature Granulocytes 0.10 (*)    All other components within normal limits  COMPREHENSIVE METABOLIC PANEL - Abnormal; Notable for the following components:   CO2 21 (*)    Creatinine, Ser 1.11 (*)    Calcium 8.6 (*)    Total Protein 6.4 (*)    Albumin 3.4 (*)    ALT 58 (*)    GFR calc non Af Amer 49 (*)    GFR calc Af Amer 57 (*)    All other components within normal limits  TROPONIN I - Abnormal; Notable for the following components:   Troponin I 0.06 (*)    All other components within normal limits  PROTIME-INR    EKG EKG Interpretation  Date/Time:  Wednesday Aug 25 2018 18:14:20 EDT Ventricular Rate:  55 PR Interval:    QRS Duration: 79 QT Interval:  440 QTC Calculation: 421 R Axis:   59 Text Interpretation:  Sinus rhythm Abnormal T, consider ischemia, diffuse leads T wave changes similar to Jan 2020 Aflutter resolved Confirmed by Sherwood Gambler [phone]) on 08/25/2018 6:25:42 PM Also confirmed by Sherwood Gambler [phone]), editor Philomena Doheny [phone])  on 08/26/2018 7:07:31 AM   Radiology Dg Chest Portable 1 View  Result Date: 08/25/2018 CLINICAL DATA:  Shortness of breath EXAM: PORTABLE CHEST 1 VIEW COMPARISON:  02/07/2018 FINDINGS: The cardiac silhouette is enlarged. The lungs are mildly hyperexpanded. There is no pneumothorax. There is scarring atelectasis at the lung bases. There is blunting of the left costophrenic angle. There is mild blunting of the right costophrenic angle. There are prominent interstitial lung markings. IMPRESSION: Cardiomegaly with findings of mild volume overload including trace to small bilateral pleural effusions. There is scarring versus  atelectasis at the lung bases bilaterally, left worse than right. Electronically Signed   By: Constance Holster M.D.   On: 08/25/2018 16:26    Procedures .Cardioversion Date/Time: 08/25/2018 5:48 PM Performed by: Doneta Public, MD Authorized by: Sherwood Gambler, MD   Pre-procedure details:    Cardioversion basis:  Elective   Rhythm:  Atrial flutter Patient sedated: Yes. Refer to sedation procedure documentation for details of sedation.  Attempt one:    Cardioversion mode:  Synchronous   Shock (Joules):  200   Shock outcome:  Conversion to normal sinus rhythm Post-procedure details:    Patient status:  Awake   Patient tolerance of procedure:  Tolerated well, no immediate complications   (including critical care time)  Medications Ordered in ED Medications  propofol (DIPRIVAN) 10 mg/mL bolus/IV push (40 mg Intravenous Given 08/25/18 1752)     Initial Impression / Assessment and Plan / ED Course  I have reviewed the triage vital signs and the nursing notes.  Pertinent labs & imaging results that were available during my care of the patient were reviewed by me and considered in my medical decision making (see chart for details).        Amy [name] is a 73 y.o. female.  With a past medical history of a flutter (on Eliquis, Multitak and metoprolol as needed) CAD, CHF, CKD, COPD, pulmonary HTN, HTN, hypothyroidism who presents with 2 days of aflutter.   On initial exam patient well appearing, not in acute distress. Hemodynamically stable. Physical  exam as above.  Labs show unremarkable CBC and CMP.  Troponin is elevated at 0.06 however this is significantly less than her baseline.  Troponin likely elevated secondary to demand ischemia.   Given that onset of a flutter has been less than 48 hours patient is compliant with her anticoagulation, discussed cardioversion with patient admission.  Risks versus benefits discussed with patient and patient agrees to cardioversion.    Cardioversion successful.  Please see above procedure documentation.  Patient remains in NSR. She is tolerating PO and states she feels much better. Discussed close follow up in a.fib clinic in which patients states she will do.  Patient discharged home in stable condition. All questions answered.  Strict return precautions given.  Final Clinical Impressions(s) / ED Diagnoses   Final diagnoses:  Atrial flutter, unspecified type [facility])    ED Discharge Orders    None       Doneta Public, MD 08/26/18 1206    Sherwood Gambler, MD 08/27/18 2223

## 2018-08-25 NOTE — Progress Notes (Signed)
Stand by for conscious sedation. No noted respiratory issues.

## 2018-08-25 NOTE — ED Triage Notes (Signed)
Pt came in via ems; c/o palpitations; reported hx of Afib and has had ablations in the past. Pt reported recently completing prednisone rx x 4 days.

## 2018-08-25 NOTE — ED Provider Notes (Signed)
Physical Exam  BP (!) 130/91 (BP Location: Right Arm)   Pulse 61   Temp 98.3 F (36.8 C) (Oral)   Resp 20   Ht _0  (1.6 m)   Wt 81.6 kg   SpO2 93%   BMI 31.89 kg/m   Physical Exam  ED Course/Procedures     .Sedation Date/Time: 08/25/2018 6:01 PM Performed by: Sherwood Gambler, MD Authorized by: Sherwood Gambler, MD   Consent:    Consent obtained:  Verbal and written   Consent given by:  Patient   Risks discussed:  Allergic reaction, dysrhythmia, inadequate sedation, nausea, prolonged hypoxia resulting in organ damage, prolonged sedation necessitating reversal, respiratory compromise necessitating ventilatory assistance and intubation and vomiting   Alternatives discussed:  Analgesia without sedation, anxiolysis and regional anesthesia Universal protocol:    Procedure explained and questions answered to patient or proxy's satisfaction: yes     Relevant documents present and verified: yes     Test results available and properly labeled: yes     Imaging studies available: yes     Required blood products, implants, devices, and special equipment available: yes     Site/side marked: yes     Immediately prior to procedure a time out was called: yes     Patient identity confirmation method:  Verbally with patient Indications:    Procedure necessitating sedation performed by:  Physician performing sedation Pre-sedation assessment:    Time since last food or drink:  4 hours   ASA classification: class 3 - patient with severe systemic disease     Neck mobility: normal     Mouth opening:  3 or more finger widths   Thyromental distance:  4 finger widths   Mallampati score:  I - soft palate, uvula, fauces, pillars visible   Pre-sedation assessments completed and reviewed: airway patency, cardiovascular function, hydration status, mental status, nausea/vomiting, pain level, respiratory function and temperature   Immediate pre-procedure details:    Reassessment: Patient reassessed  immediately prior to procedure     Reviewed: vital signs, relevant labs/tests and NPO status     Verified: bag valve mask available, emergency equipment available, intubation equipment available, IV patency confirmed, oxygen available and suction available   Procedure details (see MAR for exact dosages):    Preoxygenation:  Nasal cannula   Sedation:  Propofol   Intra-procedure monitoring:  Blood pressure monitoring, cardiac monitor, continuous pulse oximetry, frequent LOC assessments, frequent vital sign checks and continuous capnometry   Intra-procedure events: none     Total Provider sedation time (minutes):  8 Post-procedure details:    Attendance: Constant attendance by certified staff until patient recovered     Recovery: Patient returned to pre-procedure baseline     Post-sedation assessments completed and reviewed: airway patency, cardiovascular function, hydration status, mental status, nausea/vomiting, pain level, respiratory function and temperature     Patient is stable for discharge or admission: yes     Patient tolerance:  Tolerated well, no immediate complications    MDM  Patient presents with symptomatic atrial flutter.  She reports compliance with her Eliquis.  We discussed risk/benefits of ED cardioversion and she agrees.  Vitals otherwise stable.  Low level troponins likely from the heart rate as she does not have any chest pain and I am not concerned with angina or PE at this time.  Feels better and is cardioverted successfully.  Appears stable for discharge home to follow-up with her cardiologist.  CHA2DS2/VAS Stroke Risk Points  Current as of 7 minutes  ago     5 >= 2 Points: High Risk  1 - 1.99 Points: Medium Risk  0 Points: Low Risk    This is the only CHA2DS2/VAS Stroke Risk Points available for the past  year.:  Last Change: N/A     Details    This score determines the patient's risk of having a stroke if the  patient has atrial fibrillation.       Points  Metrics  1 Has Congestive Heart Failure:  Yes    Current as of 7 minutes ago  1 Has Vascular Disease:  Yes    Current as of 7 minutes ago  1 Has Hypertension:  Yes    Current as of 7 minutes ago  1 Age:  73    Current as of 7 minutes ago  0 Has Diabetes:  No    Current as of 7 minutes ago  0 Had Stroke:  No  Had TIA:  No  Had thromboembolism:  No    Current as of 7 minutes ago  1 Female:  Yes    Current as of 7 minutes ago                 Sherwood Gambler, MD 08/25/18 1826

## 2018-08-25 NOTE — Progress Notes (Signed)
Electrophysiology TeleHealth Note   Due to national recommendations of social distancing due to [place] 19, Audio/video  telehealth visit is felt to be most appropriate for this patient at this time.  See MyChart message/consent below  from today for patient consent regarding telehealth for the [facility].    Date:  08/25/2018   ID:  Amy [name], DOB 05/13/1945, MRN [id number]  Location: home   Provider location: [address] [place], [place] [address] Evaluation Performed:Follow up  PCP:  Rankins, Bill Salinas, MD  Primary Cardiologist:  Dr. Aundra Dubin Primary Electrophysiologist: Dr. Rayann Heman  CC:" I have been in  afib for over 2 days now   History of Present Illness: Amy [name] is a 73 y.o. female who presents via audio/video conferencing for a telehealth visit today.   The patient is established with Dr. Rayann Heman and the afib clinic. She has h/o of   COPD, OSA on CPAP, apical hypertrophic cardiomyopathy, pulmonary hypertension, chronic diastolic CHF, and paroxysmal atrial fibrillation.  She is currently taking dronedarone but has had intermittent  breakthrough fibrillation.She had an atrial fibrillation ablation  04/2015.  She called the office yesterday am c/o of afib since 2 am  early Monday am.  She said at times she would see HR  it in the 60's, morso with rest, but other times with walking the rate would increase into the 100's. The only change in her health is that she took some on hand prednisone for her back pain for the last 4 days. Her HR was elevated when she first got up this am. She took her am meds including metoprolol but held her lasix. As her BP is low at 90 systolic. She often runs a low BP. Her weight is up around a lb but she has been taking prednisone. Mild increase in LLE. She is chronically short of breath but felt it more yesterday. While we were taking on the phone she noted by her fitbit that her HR's were in the 60's and regular. I  asked her to get up and walk around . She did and her HR stayed regular and did not not escalate. We had been discussing an elective vrs urgent ER cardioversion but it sounded like she is in regular rhythm now so neither would be indicated. Her weight this am is 181 lbs, states her usual is 180 lbs.  F/u video call, 5/20. She reports currently that her HR is 134 with a BP of 94/74. She feels weak. Her HR did not stay down long after the phone call yesterday. Per Dr. Jackalyn Lombard last note she has severely dilated left atrium and does not have other AAD options nor is a repeat ablation candidate. So we discussed going to the ER to confirm arrhythmia and being considered for cardioversion. I also discussed with Dr. Rayann Heman and he agreed with that approach as I feel that she could quickly decompensate with her other co morbidities.   Today, she denies symptoms of  Chronic shortness of breath, no  chest pain,   orthopnea, PND, mild lower extremity edema, claudication, dizziness, presyncope, syncope, bleeding, or neurologic sequela. The patient is tolerating medications without difficulties and is otherwise without complaint today.   she denies symptoms of cough, fevers, chills, or new SOB worrisome for COVID 19.    she has a BMI of Body mass index is 33.29 kg/m.Marland Kitchen There were no vitals filed for this visit.  Past Medical History:  Diagnosis Date  Antisynthetase syndrome (HCC)    Apical variant hypertrophic cardiomyopathy ([place])    Diagnosed by echo and ECG 11/13   Atypical atrial flutter (HCC)    CAD (coronary artery disease)    a. LHC in 10/2011 demonstrated non-obstructive disease and an 80% lesion in a small D1 treated medically.    CHF (congestive heart failure) (HCC)    Chronic diastolic heart failure (HCC)    CKD (chronic kidney disease), stage III (HCC)    COPD (chronic obstructive pulmonary disease) (HCC)    Dizziness    had PT for being off balance - in approx. 2012   GERD  (gastroesophageal reflux disease)    Hyperlipidemia    Hypertension    Hypothyroid    Lumbar disc disease    Macular degeneration    OSA (obstructive sleep apnea)    Persistent atrial fibrillation        Pulmonary hypertension ([place])    a. [place] 05/2013 showed mild PAH with normal PCWP and RA pressure, could be related to OSA and low oxygen saturation.    Refusal of blood transfusions as patient is Jehovah's Witness    Sinus bradycardia    Past Surgical History:  Procedure Laterality Date   BIOPSY  12/03/2017   Procedure: BIOPSY;  Surgeon: Otis Brace, MD;  Location: [facility];  Service: Gastroenterology;;   CARDIAC CATHETERIZATION     normal coronary arteries   CARDIAC CATHETERIZATION N/A 10/31/2014   Procedure: Left Heart Cath and Coronary Angiography;  Surgeon: Leonie Man, MD;  Location: [facility];  Service: Cardiovascular;  Laterality: N/A;   CATARACT EXTRACTION Bilateral    CESAREAN SECTION  1983   COLONOSCOPY WITH PROPOFOL N/A 12/03/2017   Procedure: COLONOSCOPY WITH PROPOFOL;  Surgeon: Otis Brace, MD;  Location: [facility];  Service: Gastroenterology;  Laterality: N/A;   ELECTROPHYSIOLOGIC STUDY N/A 04/24/2015   Procedure: Atrial Fibrillation Ablation;  Surgeon: [name], MD;  Location: [facility];  Service: Cardiovascular;  Laterality: N/A;   FLEXIBLE SIGMOIDOSCOPY N/A 11/30/2017   Procedure: FLEXIBLE SIGMOIDOSCOPY;  Surgeon: Otis Brace, MD;  Location: [facility];  Service: Gastroenterology;  Laterality: N/A;   RIGHT HEART CATH N/A 06/25/2016   Procedure: Right Heart Cath;  Surgeon: Sherren Mocha, MD;  Location: [facility];  Service: Cardiovascular;  Laterality: N/A;   RIGHT HEART CATH N/A 06/10/2017   Procedure: RIGHT HEART CATH;  Surgeon: Larey Dresser, MD;  Location: [facility];  Service: Cardiovascular;  Laterality: N/A;   RIGHT HEART CATHETERIZATION N/A 06/01/2013   Procedure: RIGHT HEART  CATH;  Surgeon: Larey Dresser, MD;  Location: [facility];  Service: Cardiovascular;  Laterality: N/A;   TEE WITHOUT CARDIOVERSION N/A 04/23/2015   Procedure: TRANSESOPHAGEAL ECHOCARDIOGRAM (TEE);  Surgeon: Josue Hector, MD;  Location: [facility];  Service: Cardiovascular;  Laterality: N/A;     Current Outpatient Medications  Medication Sig Dispense Refill   acetaminophen (TYLENOL) 325 MG tablet Take 2 tablets (650 mg total) by mouth every 4 (four) hours as needed for headache or mild pain.     apixaban (ELIQUIS) 5 MG TABS tablet Take 1 tablet (5 mg total) by mouth 2 (two) times daily. 60 tablet 1   atorvastatin (LIPITOR) 80 MG tablet Take 1 tablet (80 mg total) by mouth daily with lunch. 90 tablet 3   azaTHIOprine (IMURAN) 50 MG tablet Take 100 mg by mouth daily.     Cholecalciferol (VITAMIN D) 2000 UNITS CAPS Take 2,000 Units  by mouth daily.      ezetimibe (ZETIA) 10 MG tablet Take 1 tablet (10 mg total) by mouth daily. Patient overdue for appt w/Dr Turner,must call and schedule for further refills3rd/final attempt (Patient taking differently: Take 10 mg by mouth daily. ) 15 tablet 0   furosemide (LASIX) 20 MG tablet Take 20 mg by mouth as needed.     KLOR-CON M20 20 MEQ tablet TAKE 1 TABLET BY MOUTH EVERY DAY (Patient taking differently: Take 20 mEq by mouth as needed. ) 90 tablet 2   levothyroxine (SYNTHROID, LEVOTHROID) 75 MCG tablet Take 75 mcg by mouth daily before breakfast.      macitentan (OPSUMIT) 10 MG tablet Take 1 tablet (10 mg total) by mouth daily. 30 tablet 11   metoprolol tartrate (LOPRESSOR) 25 MG tablet Take 1/2-1 tablet by mouth every 6 hours AS NEEDED for breakthrough afib HR over 100 45 tablet 1   MULTAQ 400 MG tablet TAKE 1 TABLET (400 MG TOTAL) BY MOUTH 2 (TWO) TIMES DAILY WITH A MEAL. 180 tablet 1   Multiple Vitamin (MULTIVITAMIN) capsule Take 1 capsule by mouth daily. chewable     pantoprazole (PROTONIX) 40 MG tablet Take 1 tablet (40 mg total) by  mouth daily. 90 tablet 2   Selexipag (UPTRAVI) 1600 MCG TABS Take 1,600 mcg by mouth 2 (two) times daily.      UNABLE TO FIND Med Name: CPAP  DME[facility]     vitamin B-12 100 MCG tablet Take 1 tablet (100 mcg total) by mouth daily. 30 tablet 0   No current facility-administered medications for this encounter.     Allergies:   Amiodarone; Cellcept [mycophenolate mofetil]; and Zyban [bupropion]   Social History:  The patient  reports that she quit smoking about 9 years ago. Her smoking use included cigarettes. She has a 22.00 pack-year smoking history. She has never used smokeless tobacco. She reports that she does not drink alcohol or use drugs.   Family History:  The patient's  family history includes Breast cancer in her paternal aunt; Gout in her father; Heart disease in her sister; Lung cancer in her father.    ROS:  Please see the history of present illness.   All other systems are personally reviewed and negative.   Exam: Well appearing, alert and conversant, regular work of breathing,  good skin color . Wearing O2 via nasal cannula.  Recent Labs: 02/07/2018: B Natriuretic Peptide 134.5 08/02/2018: BUN 16; Potassium 4.0; Sodium 139 08/19/2018: ALT 12; ALT 12; Creatinine, Ser 1.12; Hemoglobin 11.8; Platelets 291.0  personally reviewed    Other studies personally reviewed: Epic records reviewed.     ASSESSMENT AND PLAN:  1.  Persistent  atrial fibrillation It appears that pt has had issues with afib since early Monday around 2 am. Yesterday while on phone with me even when she go up and walked around, her HR appeared to pt per her Fitbit,to be in the 60's but did not stay that way long. She has been  over 100 consistently since then.. Her recent prednisone use may have aggravated  PAF Recommended to avoid prednisone if able I cannot increase rate control as she has soft blood pressures An elective cardioversion because of required covid outpt testing would be into next week  and I feel pt may decompensate Therefore, she has has been instructed to call EMS and go to [facility] ER as she lives alone and does not have a means of transportation Continue BB/ multaq without change for  now Continue  eliquis 5 mg bid, stated no missed doses  This patients CHA2DS2-VASc Score and unadjusted Ischemic Stroke Rate (% per year) is equal to 9.7 % stroke rate/year from a score of 6  Above score calculated as 1 point each if present [CHF, HTN, DM, Vascular=MI/PAD/Aortic Plaque, Age if 65-74, or Female] Above score calculated as 2 points each if present [Age > 75, or Stroke/TIA/TE]   COVID screen The patient does not have any symptoms that suggest any further testing/ screening at this time.  Social distancing reinforced today.    Follow-up:  With Dr. Rayann Heman in 7-10 days  Current medicines are reviewed at length with the patient today.   The patient does not have concerns regarding her medicines.  The following changes were made today:  none  Labs/ tests ordered today include: none No orders of the defined types were placed in this encounter.   Patient Risk:  after full review of this patients clinical status, I feel that they are at  high risk at this time.   Today, I have spent  15   minutes with the patient with telehealth technology discussing afib and tx plan.  Eduard Roux NP  08/25/2018 2:04 PM  [facility] [address] [place], [place] [address] [phone]   I hereby voluntarily request, consent and authorize the [facility] and its employed or contracted physicians, physician assistants, nurse practitioners or other licensed health care professionals (the Practitioner), to provide me with telemedicine health care services (the "Services") as deemed necessary by the treating Practitioner. I acknowledge and consent to receive the Services by the Practitioner via telemedicine. I understand that the telemedicine visit  will involve communicating with the Practitioner through live audiovisual communication technology and the disclosure of certain medical information by electronic transmission. I acknowledge that I have been given the opportunity to request an in-person assessment or other available alternative prior to the telemedicine visit and am voluntarily participating in the telemedicine visit.   I understand that I have the right to withhold or withdraw my consent to the use of telemedicine in the course of my care at any time, without affecting my right to future care or treatment, and that the Practitioner or I may terminate the telemedicine visit at any time. I understand that I have the right to inspect all information obtained and/or recorded in the course of the telemedicine visit and may receive copies of available information for a reasonable fee.  I understand that some of the potential risks of receiving the Services via telemedicine include:   Delay or interruption in medical evaluation due to technological equipment failure or disruption;  Information transmitted may not be sufficient (e.g. poor resolution of images) to allow for appropriate medical decision making by the Practitioner; and/or  In rare instances, security protocols could fail, causing a breach of personal health information.   Furthermore, I acknowledge that it is my responsibility to provide information about my medical history, conditions and care that is complete and accurate to the best of my ability. I acknowledge that Practitioner's advice, recommendations, and/or decision may be based on factors not within their control, such as incomplete or inaccurate data provided by me or distortions of diagnostic images or specimens that may result from electronic transmissions. I understand that the practice of medicine is not an exact science and that Practitioner makes no warranties or guarantees regarding treatment outcomes. I acknowledge  that I will receive a copy  of this consent concurrently upon execution via email to the email address I last provided but may also request a printed copy by calling the office of the [facility].  I understand that my insurance will be billed for this visit.   I have read or had this consent read to me.  I understand the contents of this consent, which adequately explains the benefits and risks of the Services being provided via telemedicine.  I have been provided ample opportunity to ask questions regarding this consent and the Services and have had my questions answered to my satisfaction.  I give my informed consent for the services to be provided through the use of telemedicine in my medical care  By participating in this telemedicine visit I agree to the above.

## 2018-08-27 ENCOUNTER — Telehealth (HOSPITAL_COMMUNITY): Payer: Self-pay | Admitting: *Deleted

## 2018-08-27 DIAGNOSIS — I480 Paroxysmal atrial fibrillation: Secondary | ICD-10-CM | POA: Diagnosis not present

## 2018-08-27 DIAGNOSIS — E785 Hyperlipidemia, unspecified: Secondary | ICD-10-CM | POA: Diagnosis not present

## 2018-08-27 DIAGNOSIS — M79672 Pain in left foot: Secondary | ICD-10-CM | POA: Diagnosis not present

## 2018-08-27 DIAGNOSIS — I509 Heart failure, unspecified: Secondary | ICD-10-CM | POA: Diagnosis not present

## 2018-08-27 DIAGNOSIS — M109 Gout, unspecified: Secondary | ICD-10-CM | POA: Diagnosis not present

## 2018-08-27 MED ORDER — COLCHICINE 0.6 MG PO TABS: 0.6000 mg | ORAL_TABLET | Freq: Every day | ORAL | 0 refills | Status: DC

## 2018-08-27 NOTE — Telephone Encounter (Signed)
Patient called in stating she noticed yesterday her left lower leg was swelling today when she got up her left ankle/foot was extremely painful and she is unable to bear weight on the leg. She denies missed or delayed doses of eliquis within the last 3 weeks. She denies weight gain, shortness of breath (outside of her normal as pt wears oxygen chronically) or pitting edema. Her right leg is without swelling.  Pt does endorse she has been off her gout medication for nearly 2 weeks due to lack of PCP refilling her medication without an appt. Pt denies redness noted on the skin. Pt does have PRN lasix she can use should she note weight gain tomorrow morning.  Discussed with Roderic Palau NP - if does not improve over weekend pt should proceed to ER. Will send in a 1 week supply of gout medication to see if improvement. ER precautions reviewed with patient and she verbalized understanding.

## 2018-08-28 ENCOUNTER — Telehealth: Payer: Self-pay | Admitting: Nurse Practitioner

## 2018-08-28 NOTE — Telephone Encounter (Signed)
   Pt called this afternoon due to a several day h/o of unilateral left ankle and foot swelling and tenderness in the setting of being off of her gout medicine.  When she has gout flares, it is typically in the left foot.  She was seen in the emergency department on May 20 for atrial flutter and says that at that time she also had mild swelling and tenderness however as that was not the primary issue, she did not bring it up with the ER physician.  She did call her primary care doctor and is now back on colchicine.  After taking a dose yesterday and this morning, she has noted some improvement in both swelling and tenderness.  She feels that her chest is a little bit congested.  I reviewed her chest x-ray from May 20 and it did show mild edema.  She did take a Lasix yesterday and says she had very good response and output throughout the day yesterday.  Her weight has not changed.  In the absence of being able to evaluate her foot/ankle visually, based on the unilateral nature and gout history with some improvement with colchicine, I suspect this is most likely a gout flare.  She is on chronic Eliquis and has not missed any doses thus risk of DVT is low.  With regards to congestion, this is tougher to tell.  She took Lasix yesterday and weight has been stable.  I advised that if her blood pressure stable she can try and take another Lasix today to see if that helps any.  Caller verbalized understanding and was grateful for the call back.  Murray Hodgkins, NP 08/28/2018, 1:32 PM

## 2018-08-30 ENCOUNTER — Inpatient Hospital Stay: Admission: RE | Admit: 2018-08-30 | Payer: Medicare Other | Source: Ambulatory Visit

## 2018-08-30 ENCOUNTER — Inpatient Hospital Stay: Admit: 2018-08-30 | Discharge: 2018-08-30 | Disposition: A | Discharge status: Home / Self Care | Payer: Medicare Other

## 2018-08-30 ENCOUNTER — Ambulatory Visit (INDEPENDENT_AMBULATORY_CARE_PROVIDER_SITE_OTHER)
Admission: RE | Admit: 2018-08-30 | Discharge: 2018-08-30 | Disposition: A | Discharge status: Home / Self Care | Payer: Medicare Other | Source: Ambulatory Visit | Attending: Family Medicine | Admitting: Family Medicine

## 2018-08-30 DIAGNOSIS — M7989 Other specified soft tissue disorders: Secondary | ICD-10-CM | POA: Diagnosis not present

## 2018-08-30 DIAGNOSIS — R197 Diarrhea, unspecified: Secondary | ICD-10-CM

## 2018-08-30 MED ORDER — ONDANSETRON 4 MG PO TBDP: 4.0000 mg | ORAL_TABLET | Freq: Three times a day (TID) | ORAL | 0 refills | Status: DC | PRN

## 2018-08-30 NOTE — ED Provider Notes (Signed)
Virtual Visit via Video Note:  Amy [name]  initiated request for Telemedicine visit with [facility] team. I connected with Amy [name]  on 08/30/2018 at 12:06 PM  for a synchronized telemedicine visit using a video enabled HIPPA compliant telemedicine application. I verified that I am speaking with Amy [name]  using two identifiers. Amy C Wieters, PA-C  was physically located in a [facility]gent care site and Amy [name] was located at a different location.   The limitations of evaluation and management by telemedicine as well as the availability of in-person appointments were discussed. Patient was informed that she  may incur a bill ( including co-pay) for this virtual visit encounter. Amy [name]  expressed understanding and gave verbal consent to proceed with virtual visit.     History of Present Illness:Amy [name]  is a 73 y.o. female presents with concern over nausea, diarrhea as well as left foot swelling and pain.  Patient states that over the past week she has developed increased swelling to her left ankle.  Towards the end of the week her symptoms had worsened and she said she started to take colchicine to treat as gout.  She took 1 tablet on Friday, 2 tablets on Saturday and 2 tablets on Sunday.  She has not seen any improvement in her symptoms.  She denies any overlying erythema to her skin, but does endorse some increased warmth.  States that the swelling is across her entire foot and toes whereas typically with gout she has it mainly in her big toe.  Notes that swelling did extend into distal calf yesterday, but is slightly improved today and mainly involving the foot and ankle.  Denies calf pain.  Denies symptoms in right foot.  Patient is currently on Eliquis 5 mg for A. fib/flutter, she was seen in the ED earlier this week and had cardioversion as she had noticed her heart rate increasing.  She no longer is  having the same symptoms from this.  She denies any chest pain or shortness of breath.  She has had had frequent watery bowel movements as well as nausea.  Yesterday she did have one episode of vomiting.  She denies associated abdominal pain with this.  Past Medical History:  Diagnosis Date  . Antisynthetase syndrome ([place])   . Apical variant hypertrophic cardiomyopathy ([place])    Diagnosed by echo and ECG 11/13  . Atypical atrial flutter ([place])   . CAD (coronary artery disease)    a. LHC in 10/2011 demonstrated non-obstructive disease and an 80% lesion in a small D1 treated medically.   . CHF (congestive heart failure) ([place])   . Chronic diastolic heart failure ([place])   . CKD (chronic kidney disease), stage III ([place])   . COPD (chronic obstructive pulmonary disease) ([place])   . Dizziness    had PT for being off balance - in approx. 2012  . GERD (gastroesophageal reflux disease)   . Hyperlipidemia   . Hypertension   . Hypothyroid   . Lumbar disc disease   . Macular degeneration   . OSA (obstructive sleep apnea)   . Persistent atrial fibrillation       . Pulmonary hypertension ([place])    a. [place] 05/2013 showed mild PAH with normal PCWP and RA pressure, could be related to OSA and low oxygen saturation.   . Refusal of blood transfusions as patient is Jehovah's Witness   . Sinus bradycardia  Allergies  Allergen Reactions  . Amiodarone     Dyspnea - felt to be 2/2 amio lung toxicity Has tolerated Omnipaque   . Cellcept [Mycophenolate Mofetil] Other (See Comments)    Gastric Bleeding  . Zyban [Bupropion] Itching        Observations/Objective: Patient checked her blood pressure and heart rate at home which was 108/67 with a heart rate of 61 Physical Exam  Constitutional: She is oriented to person, place, and time and well-developed, well-nourished, and in no distress.  HENT:  Head: Normocephalic and atraumatic.  Eyes: Pupils are equal, round, and reactive to light.  Neck: Normal range  of motion. Neck supple.  Cardiovascular: Normal rate.  Pulmonary/Chest: Effort normal. No respiratory distress.  Wearing 4 L o2  Musculoskeletal: Normal range of motion.     Comments: Report 3+ pitting edema by patient  Neurological: She is alert and oriented to person, place, and time.  Skin: Skin is warm and dry.     Assessment and Plan:  Patient with lower leg swelling and edema, mild pain.  Denies erythema, do not suspect underlying cellulitis at this time.  Is already on Eliquis 5 mg daily, DVT less likely.  Given edema varying and seems suggestive of pitting edema, recommending increasing Lasix to 20 mg twice daily for the next 2 to 3 days, elevation and compression.  Follow-up in person for further evaluation if not improving with the above recommendations.  Follow-up sooner if developing increased pain or redness concerning for cellulitis.  Follow Up Instructions:    I discussed the assessment and treatment plan with the patient. The patient was provided an opportunity to ask questions and all were answered. The patient agreed with the plan and demonstrated an understanding of the instructions.   The patient was advised to call back or seek an in-person evaluation if the symptoms worsen or if the condition fails to improve as anticipated.     Janith Lima, PA-C  08/30/2018 12:06 PM         Debara Pickett C, PA-C 08/30/18 1224

## 2018-08-30 NOTE — Discharge Instructions (Addendum)
Please increase lasix to 20 mg twice daily for the next 2-3 days Keep foot elevated Wear compression stockings or ace wraps  If developing increased pain swelling or redness to foot, please be seen in person If not having improvement with increasing lasix in 2-3 days please follow up in person  Diarrhea/Nausea:  This is likely side effect from colchicine, please do not take any further colchicine May use Zofran as needed for nausea Please monitor for resolution of diarrhea Continue to eat and drink like normal if you are able  Please follow-up in person if developing fevers, abdominal pain, persistent diarrhea, bloody diarrhea, bloody vomiting, unable to keep food or drink down despite using Zofran

## 2018-08-31 ENCOUNTER — Ambulatory Visit: Payer: Medicare Other | Admitting: Pulmonary Disease

## 2018-09-01 DIAGNOSIS — M79672 Pain in left foot: Secondary | ICD-10-CM | POA: Diagnosis not present

## 2018-09-01 DIAGNOSIS — M109 Gout, unspecified: Secondary | ICD-10-CM | POA: Diagnosis not present

## 2018-09-01 DIAGNOSIS — I5030 Unspecified diastolic (congestive) heart failure: Secondary | ICD-10-CM | POA: Diagnosis not present

## 2018-09-01 DIAGNOSIS — I480 Paroxysmal atrial fibrillation: Secondary | ICD-10-CM | POA: Diagnosis not present

## 2018-09-02 ENCOUNTER — Telehealth: Payer: Self-pay

## 2018-09-02 NOTE — Telephone Encounter (Signed)
Spoke with pt regarding appt on 09/03/18. Pt was advise to check vitals prior to appt. Pt questions and concerns were address.

## 2018-09-03 ENCOUNTER — Telehealth (INDEPENDENT_AMBULATORY_CARE_PROVIDER_SITE_OTHER): Payer: Medicare Other | Admitting: Internal Medicine

## 2018-09-03 ENCOUNTER — Encounter: Payer: Self-pay | Admitting: Internal Medicine

## 2018-09-03 VITALS — BP 87/58 | HR 59 | Ht 63.0 in | Wt 175.0 lb

## 2018-09-03 DIAGNOSIS — I10 Essential (primary) hypertension: Secondary | ICD-10-CM

## 2018-09-03 DIAGNOSIS — I4819 Other persistent atrial fibrillation: Secondary | ICD-10-CM

## 2018-09-03 DIAGNOSIS — I270 Primary pulmonary hypertension: Secondary | ICD-10-CM | POA: Diagnosis not present

## 2018-09-03 NOTE — Progress Notes (Signed)
Electrophysiology TeleHealth Note   Due to national recommendations of social distancing due to COVID 19, an audio/video telehealth visit is felt to be most appropriate for this patient at this time.  See MyChart message from today for the patient's consent to telehealth for [facility].   Date:  09/03/2018   ID:  Amy [name], DOB [birth year], MRN [id number]  Location: patient's home  Provider location: [address] [place]  Evaluation Performed: Follow-up visit  PCP:  Aretta Nip, MD  Cardiologist:   Dr Aundra Dubin Electrophysiologist:  Dr Rayann Heman  Chief Complaint:  afib  History of Present Illness:    Amy [name] is a 73 y.o. female who presents via audio/video conferencing for a telehealth visit today.  Since last being seen in our clinic, the patient reports doing reasonably well.  She was recently cardioverted.  She feels that she remains in sinus rhythm.  Her primary concern is persistent foot edema.  She is following with her primary care physician.  Her SOB is stable.  She is on O2.  Today, she denies symptoms of palpitations, chest pain,  dizziness, presyncope, or syncope.  The patient is otherwise without complaint today.  The patient denies symptoms of fevers, chills, cough, or new SOB worrisome for COVID 19.  Past Medical History:  Diagnosis Date  . 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 Apical variant hypertrophic cardiomyopathy ([place])    Diagnosed by echo and ECG 11/13  . Atypical atrial flutter ([place])   . CAD (coronary artery disease)    a. LHC in 10/2011 demonstrated non-obstructive disease and an 80% lesion in a small D1 treated medically.   . CHF (congestive heart failure) ([place])   . Chronic diastolic heart failure ([place])   . CKD (chronic kidney disease), stage III ([place])   . COPD (chronic obstructive pulmonary disease) ([place])   . Dizziness    had PT for being off balance - in approx. 2012  . GERD (gastroesophageal reflux disease)    . Hyperlipidemia   . Hypertension   . Hypothyroid   . Lumbar disc disease   . Macular degeneration   . OSA (obstructive sleep apnea)   . Persistent atrial fibrillation       . Pulmonary hypertension ([place])    a. [place] 05/2013 showed mild PAH with normal PCWP and RA pressure, could be related to OSA and low oxygen saturation.   . Refusal of blood transfusions as patient is Jehovah's Witness   . Sinus bradycardia     Past Surgical History:  Procedure Laterality Date  . BIOPSY  12/03/2017   Procedure: BIOPSY;  Surgeon: Otis Brace, MD;  Location: [facility];  Service: Gastroenterology;;  . CARDIAC CATHETERIZATION     normal coronary arteries  . CARDIAC CATHETERIZATION N/A 10/31/2014   Procedure: Left Heart Cath and Coronary Angiography;  Surgeon: Leonie Man, MD;  Location: [facility];  Service: Cardiovascular;  Laterality: N/A;  . CATARACT EXTRACTION Bilateral   . CESAREAN SECTION  1983  . COLONOSCOPY WITH PROPOFOL N/A 12/03/2017   Procedure: COLONOSCOPY WITH PROPOFOL;  Surgeon: Otis Brace, MD;  Location: [facility];  Service: Gastroenterology;  Laterality: N/A;  . ELECTROPHYSIOLOGIC STUDY N/A 04/24/2015   Procedure: Atrial Fibrillation Ablation;  Surgeon: [name], MD;  Location: [facility] LAB;  Service: Cardiovascular;  Laterality: N/A;  . FLEXIBLE SIGMOIDOSCOPY N/A 11/30/2017   Procedure: FLEXIBLE SIGMOIDOSCOPY;  Surgeon: Otis Brace, MD;  Location: [facility];  Service:  Gastroenterology;  Laterality: N/A;  . RIGHT HEART CATH N/A 06/25/2016   Procedure: Right Heart Cath;  Surgeon: Sherren Mocha, MD;  Location: [facility] CV LAB;  Service: Cardiovascular;  Laterality: N/A;  . RIGHT HEART CATH N/A 06/10/2017   Procedure: RIGHT HEART CATH;  Surgeon: Larey Dresser, MD;  Location: [place] [facility];  Service: Cardiovascular;  Laterality: N/A;  . RIGHT HEART CATHETERIZATION N/A 06/01/2013   Procedure: RIGHT HEART CATH;  Surgeon: Larey Dresser,  MD;  Location: [facility] CATH LAB;  Service: Cardiovascular;  Laterality: N/A;  . TEE WITHOUT CARDIOVERSION N/A 04/23/2015   Procedure: TRANSESOPHAGEAL ECHOCARDIOGRAM (TEE);  Surgeon: Josue Hector, MD;  Location: [facility] ENDOSCOPY;  Service: Cardiovascular;  Laterality: N/A;    Current Outpatient Medications  Medication Sig Dispense Refill  . acetaminophen (TYLENOL) 325 MG tablet Take 2 tablets (650 mg total) by mouth every 4 (four) hours as needed for headache or mild pain.    Marland Kitchen apixaban (ELIQUIS) 5 MG TABS tablet Take 1 tablet (5 mg total) by mouth 2 (two) times daily. 60 tablet 1  . atorvastatin (LIPITOR) 80 MG tablet Take 1 tablet (80 mg total) by mouth daily with lunch. 90 tablet 3  . azaTHIOprine (IMURAN) 50 MG tablet Take 100 mg by mouth daily.    . Cholecalciferol (VITAMIN D) 2000 UNITS CAPS Take 2,000 Units by mouth daily.     . colchicine 0.6 MG tablet Take 1 tablet (0.6 mg total) by mouth daily. 15 tablet 0  . ezetimibe (ZETIA) 10 MG tablet Take 10 mg by mouth daily.    . furosemide (LASIX) 20 MG tablet Take 20 mg by mouth as needed.    Marland Kitchen levothyroxine (SYNTHROID, LEVOTHROID) 75 MCG tablet Take 75 mcg by mouth daily before breakfast.     . macitentan (OPSUMIT) 10 MG tablet Take 1 tablet (10 mg total) by mouth daily. 30 tablet 11  . metoprolol tartrate (LOPRESSOR) 25 MG tablet Take 1/2-1 tablet by mouth every 6 hours AS NEEDED for breakthrough afib HR over 100 45 tablet 1  . MULTAQ 400 MG tablet TAKE 1 TABLET (400 MG TOTAL) BY MOUTH 2 (TWO) TIMES DAILY WITH A MEAL. 180 tablet 1  . Multiple Vitamin (MULTIVITAMIN) capsule Take 1 capsule by mouth daily. chewable    . ondansetron (ZOFRAN-ODT) 4 MG disintegrating tablet Take 1 tablet (4 mg total) by mouth every 8 (eight) hours as needed for nausea or vomiting. 20 tablet 0  . pantoprazole (PROTONIX) 40 MG tablet Take 1 tablet (40 mg total) by mouth daily. 90 tablet 2  . potassium chloride SA (K-DUR) 20 MEQ tablet Take 20 mEq by mouth daily.    .  Selexipag (UPTRAVI) 1600 MCG TABS Take 1,600 mcg by mouth 2 (two) times daily.     Marland Kitchen UNABLE TO FIND Med Name: CPAP  DME[facility]    . vitamin B-12 100 MCG tablet Take 1 tablet (100 mcg total) by mouth daily. 30 tablet 0   No current facility-administered medications for this visit.     Allergies:   Amiodarone; Cellcept [mycophenolate mofetil]; and Zyban [bupropion]   Social History:  The patient  reports that she quit smoking about 9 years ago. Her smoking use included cigarettes. She has a 22.00 pack-year smoking history. She has never used smokeless tobacco. She reports that she does not drink alcohol or use drugs.   Family History:  The patient's family history includes Breast cancer in her paternal aunt; Gout in her father; Heart disease  in her sister; Lung cancer in her father.   ROS:  Please see the history of present illness.   All other systems are personally reviewed and negative.    Exam:    Vital Signs:  BP (!) 87/58   Pulse (!) 59   Ht _0  (1.6 m)   Wt 175 lb (79.4 kg)   BMI 31.00 kg/m   Chronically ill appearing, alert and conversant, wearing O2, regular work of breathing,  good skin color Eyes- anicteric, neuro- grossly intact, skin- no apparent rash or lesions or cyanosis, mouth- oral mucosa is pink   Labs/Other Tests and Data Reviewed:    Recent Labs: 02/07/2018: B Natriuretic Peptide 134.5 08/25/2018: ALT 58; BUN 20; Creatinine, Ser 1.11; Hemoglobin 12.7; Platelets 308; Potassium 4.2; Sodium 140   Wt Readings from Last 3 Encounters:  09/03/18 175 lb (79.4 kg)  08/25/18 180 lb (81.6 kg)  06/15/18 182 lb (82.6 kg)     Other studies personally reviewed: Additional studies/ records that were reviewed today include: AF clinic notes, my prior notes  Review of the above records today demonstrates: as above Prior radiographs: CXR 08/25/18 reviewed   ASSESSMENT & PLAN:    1.  Persistent atrial fibrillation/ atypical atrial flutter (multiple circuits noted on EPS  04/24/15) She has severe LA enlargement with extensive atriopathy and multiple atypical atrial flutter circuits, not amenable to ablation 2017.  Conservative management is advised Continue multaq at this time I would not advise further ablation for this patient.  She would be very high risk given low BP and chronic O2/ lung disease and success rates would be low. She really does not have additional AAD options.  Ultimately, rate control may be our only sustainable option.  2. HTN bp is low  3. Overweight Lifestyle modification is encouraged  4. Pulmonary HTN Followed by Dr Aundra Dubin  5. COVID 19 screen The patient denies symptoms of COVID 19 at this time.  The importance of social distancing was discussed today.  Follow-up:  With Dr Aundra Dubin as scheduled AF clinic in 3 months  Current medicines are reviewed at length with the patient today.   The patient does not have concerns regarding her medicines.  The following changes were made today:  none  Labs/ tests ordered today include:  No orders of the defined types were placed in this encounter.   Patient Risk:  after full review of this patients clinical status, I feel that they are at highrisk at this time.  Today, I have spent 20 minutes with the patient with telehealth technology discussing afib .    Army Fossa, MD  09/03/2018 10:29 AM     [facility] [address] [place] [address] [phone] (office) [phone] (fax)

## 2018-09-06 DIAGNOSIS — M109 Gout, unspecified: Secondary | ICD-10-CM | POA: Diagnosis not present

## 2018-09-06 DIAGNOSIS — M79672 Pain in left foot: Secondary | ICD-10-CM | POA: Diagnosis not present

## 2018-09-13 ENCOUNTER — Other Ambulatory Visit (HOSPITAL_COMMUNITY): Payer: Self-pay | Admitting: *Deleted

## 2018-09-13 MED ORDER — COLCHICINE 0.6 MG PO TABS: 0.6000 mg | ORAL_TABLET | Freq: Every day | ORAL | 0 refills | Status: DC

## 2018-09-15 ENCOUNTER — Other Ambulatory Visit (INDEPENDENT_AMBULATORY_CARE_PROVIDER_SITE_OTHER): Payer: Medicare Other

## 2018-09-15 DIAGNOSIS — Z5181 Encounter for therapeutic drug level monitoring: Secondary | ICD-10-CM | POA: Diagnosis not present

## 2018-09-15 LAB — CBC WITH DIFFERENTIAL/PLATELET
Basophils Absolute: 0 10*3/uL (ref 0.0–0.1)
Basophils Relative: 0.9 % (ref 0.0–3.0)
Eosinophils Absolute: 0.3 10*3/uL (ref 0.0–0.7)
Eosinophils Relative: 5 % (ref 0.0–5.0)
HCT: 36.1 % (ref 36.0–46.0)
Hemoglobin: 11.6 g/dL — ABNORMAL LOW (ref 12.0–15.0)
Lymphocytes Relative: 17.2 % (ref 12.0–46.0)
Lymphs Abs: 0.9 10*3/uL (ref 0.7–4.0)
MCHC: 32.2 g/dL (ref 30.0–36.0)
MCV: 91.2 fl (ref 78.0–100.0)
Monocytes Absolute: 0.6 10*3/uL (ref 0.1–1.0)
Monocytes Relative: 12.4 % — ABNORMAL HIGH (ref 3.0–12.0)
Neutro Abs: 3.4 10*3/uL (ref 1.4–7.7)
Neutrophils Relative %: 64.5 % (ref 43.0–77.0)
Platelets: 486 10*3/uL — ABNORMAL HIGH (ref 150.0–400.0)
RBC: 3.96 Mil/uL (ref 3.87–5.11)
RDW: 17.5 % — ABNORMAL HIGH (ref 11.5–15.5)
WBC: 5.2 10*3/uL (ref 4.0–10.5)

## 2018-09-15 LAB — COMPREHENSIVE METABOLIC PANEL
ALT: 19 U/L (ref 0–35)
AST: 21 U/L (ref 0–37)
Albumin: 3.8 g/dL (ref 3.5–5.2)
Alkaline Phosphatase: 92 U/L (ref 39–117)
BUN: 13 mg/dL (ref 6–23)
CO2: 24 mEq/L (ref 19–32)
Calcium: 8.8 mg/dL (ref 8.4–10.5)
Chloride: 106 mEq/L (ref 96–112)
Creatinine, Ser: 1.16 mg/dL (ref 0.40–1.20)
GFR: 55.35 mL/min — ABNORMAL LOW (ref >60.00)
Glucose, Bld: 73 mg/dL (ref 70–99)
Potassium: 4.2 mEq/L (ref 3.5–5.1)
Sodium: 138 mEq/L (ref 135–145)
Total Bilirubin: 0.4 mg/dL (ref 0.2–1.2)
Total Protein: 6.9 g/dL (ref 6.0–8.3)

## 2018-09-17 ENCOUNTER — Other Ambulatory Visit (HOSPITAL_COMMUNITY): Payer: Self-pay | Admitting: Cardiology

## 2018-09-25 ENCOUNTER — Other Ambulatory Visit (HOSPITAL_COMMUNITY): Payer: Self-pay | Admitting: Cardiology

## 2018-10-14 ENCOUNTER — Other Ambulatory Visit: Payer: Self-pay

## 2018-10-14 ENCOUNTER — Other Ambulatory Visit (HOSPITAL_COMMUNITY): Payer: Self-pay | Admitting: Nurse Practitioner

## 2018-10-14 ENCOUNTER — Encounter (HOSPITAL_COMMUNITY): Payer: Self-pay | Admitting: Cardiology

## 2018-10-14 ENCOUNTER — Ambulatory Visit (HOSPITAL_BASED_OUTPATIENT_CLINIC_OR_DEPARTMENT_OTHER)
Admission: RE | Admit: 2018-10-14 | Discharge: 2018-10-14 | Disposition: A | Discharge status: Home / Self Care | Payer: Medicare Other | Source: Ambulatory Visit | Attending: Cardiology | Admitting: Cardiology

## 2018-10-14 ENCOUNTER — Ambulatory Visit (HOSPITAL_COMMUNITY)
Admission: RE | Admit: 2018-10-14 | Discharge: 2018-10-14 | Disposition: A | Discharge status: Home / Self Care | Payer: Medicare Other | Source: Ambulatory Visit | Attending: Cardiology | Admitting: Cardiology

## 2018-10-14 VITALS — BP 118/79 | HR 61 | Wt 183.0 lb

## 2018-10-14 DIAGNOSIS — Z7901 Long term (current) use of anticoagulants: Secondary | ICD-10-CM | POA: Diagnosis not present

## 2018-10-14 DIAGNOSIS — G4733 Obstructive sleep apnea (adult) (pediatric): Secondary | ICD-10-CM | POA: Insufficient documentation

## 2018-10-14 DIAGNOSIS — Z79899 Other long term (current) drug therapy: Secondary | ICD-10-CM | POA: Insufficient documentation

## 2018-10-14 DIAGNOSIS — Z801 Family history of malignant neoplasm of trachea, bronchus and lung: Secondary | ICD-10-CM | POA: Insufficient documentation

## 2018-10-14 DIAGNOSIS — I509 Heart failure, unspecified: Secondary | ICD-10-CM | POA: Diagnosis not present

## 2018-10-14 DIAGNOSIS — E785 Hyperlipidemia, unspecified: Secondary | ICD-10-CM | POA: Insufficient documentation

## 2018-10-14 DIAGNOSIS — J449 Chronic obstructive pulmonary disease, unspecified: Secondary | ICD-10-CM | POA: Diagnosis not present

## 2018-10-14 DIAGNOSIS — I480 Paroxysmal atrial fibrillation: Secondary | ICD-10-CM | POA: Diagnosis not present

## 2018-10-14 DIAGNOSIS — Z888 Allergy status to other drugs, medicaments and biological substances status: Secondary | ICD-10-CM | POA: Insufficient documentation

## 2018-10-14 DIAGNOSIS — I5032 Chronic diastolic (congestive) heart failure: Secondary | ICD-10-CM | POA: Insufficient documentation

## 2018-10-14 DIAGNOSIS — I270 Primary pulmonary hypertension: Secondary | ICD-10-CM | POA: Diagnosis not present

## 2018-10-14 DIAGNOSIS — Z87891 Personal history of nicotine dependence: Secondary | ICD-10-CM | POA: Diagnosis not present

## 2018-10-14 DIAGNOSIS — I2510 Atherosclerotic heart disease of native coronary artery without angina pectoris: Secondary | ICD-10-CM | POA: Insufficient documentation

## 2018-10-14 DIAGNOSIS — N183 Chronic kidney disease, stage 3 (moderate): Secondary | ICD-10-CM | POA: Diagnosis not present

## 2018-10-14 DIAGNOSIS — Z79890 Hormone replacement therapy (postmenopausal): Secondary | ICD-10-CM | POA: Insufficient documentation

## 2018-10-14 DIAGNOSIS — K219 Gastro-esophageal reflux disease without esophagitis: Secondary | ICD-10-CM | POA: Insufficient documentation

## 2018-10-14 DIAGNOSIS — E039 Hypothyroidism, unspecified: Secondary | ICD-10-CM | POA: Diagnosis not present

## 2018-10-14 DIAGNOSIS — I422 Other hypertrophic cardiomyopathy: Secondary | ICD-10-CM | POA: Diagnosis not present

## 2018-10-14 DIAGNOSIS — I272 Pulmonary hypertension, unspecified: Secondary | ICD-10-CM | POA: Insufficient documentation

## 2018-10-14 DIAGNOSIS — Z8249 Family history of ischemic heart disease and other diseases of the circulatory system: Secondary | ICD-10-CM | POA: Diagnosis not present

## 2018-10-14 LAB — BASIC METABOLIC PANEL
Anion gap: 10 (ref 5–15)
BUN: 14 mg/dL (ref 8–23)
CO2: 19 mmol/L — ABNORMAL LOW (ref 22–32)
Calcium: 9.2 mg/dL (ref 8.9–10.3)
Chloride: 110 mmol/L (ref 98–111)
Creatinine, Ser: 1.13 mg/dL — ABNORMAL HIGH (ref 0.44–1.00)
GFR calc Af Amer: 56 mL/min — ABNORMAL LOW (ref >60)
GFR calc non Af Amer: 48 mL/min — ABNORMAL LOW (ref >60)
Glucose, Bld: 84 mg/dL (ref 70–99)
Potassium: 4.5 mmol/L (ref 3.5–5.1)
Sodium: 139 mmol/L (ref 135–145)

## 2018-10-14 LAB — BRAIN NATRIURETIC PEPTIDE: B Natriuretic Peptide: 188.3 pg/mL — ABNORMAL HIGH (ref 0.0–100.0)

## 2018-10-14 NOTE — Progress Notes (Signed)
  Echocardiogram 2D Echocardiogram has been performed.  Amy [name] 10/14/2018, 2:00 PM

## 2018-10-14 NOTE — Patient Instructions (Signed)
Labs today We will only contact you if something comes back abnormal or we need to make some changes. Otherwise no news is good news!  Your physician recommends that you schedule a follow-up appointment in: 3 months. You will get a call to schedule this appointment.

## 2018-10-14 NOTE — Progress Notes (Signed)
Pt. Completed 6 minute walk. Pt. Walked 571f (170.6 m ) Heart rate ranged 65-82 O2 ranged 77-95 on 6 liters Rest breaks x3

## 2018-10-15 ENCOUNTER — Other Ambulatory Visit (HOSPITAL_COMMUNITY): Payer: Self-pay

## 2018-10-15 MED ORDER — UPTRAVI 1600 MCG PO TABS: 1600.0000 ug | ORAL_TABLET | Freq: Two times a day (BID) | ORAL | 1 refills | Status: DC

## 2018-10-15 NOTE — Progress Notes (Signed)
Date:  10/15/2018   ID:  Amy [name], DOB Aug 09, 1945, MRN [id number]   Provider location: [facility] Type of Visit: Established patient   PCP:  Aretta Nip, MD  Cardiologist: Dr. Aundra Dubin  Chief Complaint: Shortness of breath.    History of Present Illness: Amy [name] is a 73 y.o. female with a history of COPD, OSA on CPAP, apical hypertrophic cardiomyopathy, pulmonary hypertension, chronic diastolic CHF, and paroxysmal atrial fibrillation.  She was admitted in 3/18 with CHF and dyspnea, found to have pulmonary arterial hypertension by RHC.  Prior to this, she has had a long history of PAF.  She is currently taking dronedarone but has had breakthrough fibrillation. She had an atrial fibrillation ablation in the past. She has apical hypertrophic cardiomyopathy proven by cardiac MRI with a consistent ECG.   She is on Opsumit and has started Selexipag, now titrated up to 1600 mcg bid. She was unable to tolerate Adcirca. Symptomatically, she has not seen significant improvement with selective pulmonary vasodilators to treat PAH.     I repeated her RHC in 3/19.  This showed lower PA pressure and normal right and left heart filling pressures, but more worrisomely, cardiac output was low.  However, on repeat of echo in 3/19, the RV actually looked normal in size and systolic function.  Given concern for possible PVOD as cause of her worsening symptoms on treatment, I ordered a high resolution CT chest.  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  I had her see Dr [place] Bells. He sent autoimmune serologies again, weak P-ANCA positivity so she was referred to rheumatology.  Echo bubble study was negative.  CCP and Ro-52 were positive.  Also of note, she has had atrial fibrillation ablation and pulmonary vein stenosis is a consideration.  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  She was started on prednisone and Cellcept.  Cellcept was stopped after GI bleed.  She was seen by rheumatology at [facility] and started on azathioprine.   In 9/19, she was admitted with a GI bleed.  Colonoscopy did not show a definite cause.  She was started back on Eliquis.   She was unable to tolerate Adempas so it was stopped (lightheadedness).    In 5/20, she was in the ER with atypical atrial flutter.  She was cardioverted and discharged home.   Echo was done today showing EF 65-70% with an apical HCM pattern, normal RV size and systolic function, PASP 39 mmHg, normal IVC.   She returns today for followup of CHF and pulmonary hypertension.  She has not been using Lasix and weight is fairly stable.  She walks with a walker, can go about 200 feet before she has to stop due to dyspnea.  This is stable.  No chest pain.  No orthopnea/PND.  She continues to use 4L home oxygen. No lightheadedness or syncope.   ECG (personally reviewed): NSR, lateral TWIs  Labs (2/18): TSH normal Labs (3/18): K 3.8, creatinine 1.26, BNP 424 Labs (5/18): K 4.4, creatinine 1.96, anti-SCL 70 negative, RF very slightly elevated, HIV negative.  Labs (6/18): K 4, creatinine 1.2, LDL 49 Labs (9/18): BNP 149, K 4, creatinine 1.4, BNP 149 Labs (2/19): BNP 126, K 3.8, creatinine 1.5, CCP+, Ro-52+, elevated CK.  Labs (7/19): BNP 676, K 4.7, creatinine 1.74 Labs (9/19): K  3.8, creatinine 1.39, BNP 220 Labs (11/19): K 3.5, creatinine 1.37, BNP 134 Labs (1/20): K 4.1, creatinine 1.18 Labs (4/20): K 4, creatinine 1.27, LFTs normal, hgb 11.7, plts 237 Labs (6/20): K 4.2, creatinine 1.16, hgb 11.6  4/18 6 minute walk: 109 m  10/18 6 minute walk: 229 m 2/19 6 minute walk: < 100 m 5/19 6 minute walk: 107 m 1/20 6 minute walk: 188 m 7/20 6 minute walk: 171 m  PMH:  1. COPD: Quit smoking 2011.    - PFTs (3/18): minimal obstruction, minimal restriction, DLCO 22% (severely decreased).  2. Hypothyroidism 3. Hyperlipidemia 4. OSA: Uses CPAP 5. Apical hypertrophic cardiomyopathy:  - Cardiac MRI (7/16) with EF 79%, mid-apical LV hypertrophy with spade-like ventricle, mid-myocardial LGE was noted at the apex.   6. GERD 7. Atrial fibrillation: Paroxysmal.  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 Long QT interval so dofetilide and sotalol have not been used. She has had atrial fibrillation ablation.  - Currently on dronedarone.  - DCCV to NSR in ER 7/19.  8. Coronary artery disease: LHC (7/16) with nonobstructive disease + 85% ostial stenosis small D1.  9. Diastolic CHF: Echo (4/76) with EF 65-70%, apical hypertrophic cardiomyopathy, severe LAE, PASP 86 mmHg.  - RHC (3/18): mean RA 4, PA 69/23 mean 38, mean PCWP 8, CI 2.17, PVR 7.3 WU Fick, 9.6 WU thermo.  - Echo (3/19): EF 65-70%, apical hypertrophic cardiomyopathy, normal RV size and systolic function, severe LAE.  - Echo (6/19): EF 65-70%, apical HCM, restrictive diastolic dysfunction, negative bubble study, PASP 42 mmHg.  10. Pulmonary hypertension: See RHC above.  - V/Q scan negative 3/18 - HIV, SCL70 negative.  RF borderline elevated, doubt significant.  ANA negative.  - Unable to tolerate Adcirca - RHC (3/19): mean RA 6, PA 49/16 mean 29, mean PCWP 10, CI 1.9/PVR 5.3 WU Fick, CI 1.4/PVR 7.3 Thermo.  - High resolution CT chest (3/19): respiratory motion artifact present, but suspect mild emphysema with lungs grossly clear.  - P-ANCA+, CCP+, RO-52+, CK elevated.  - Echo 6/19 with negative bubble study.  - Unable to tolerate Adempas due to hypotension.  - Echo (7/20): EF 65-70% with an apical HCM pattern, normal RV size and systolic function, PASP 39 mmHg, normal IVC.  11. CPX (12/17): peak VO2 8.5, RER 0.96, VE/VCO2 slope 67. Submaximal but appears to show severe functional impairment.   12. CKD: Stage 3.  13. 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   Current Outpatient Medications  Medication Sig Dispense Refill   acetaminophen (TYLENOL) 325 MG tablet Take 2 tablets (650 mg total) by mouth every 4 (four) hours as needed for headache or mild pain.     apixaban (ELIQUIS) 5 MG TABS tablet Take 1 tablet (5 mg total) by mouth 2 (two) times daily. 60 tablet 1   atorvastatin (LIPITOR) 80 MG tablet Take 1 tablet (80 mg total) by mouth daily with lunch. 90 tablet 3   azaTHIOprine (IMURAN) 50 MG tablet Take 100 mg by mouth daily.     Cholecalciferol (VITAMIN D) 2000 UNITS CAPS Take 2,000 Units by mouth daily.      ezetimibe (ZETIA) 10 MG tablet Take 10 mg by mouth daily.     furosemide (LASIX) 20 MG tablet Take 20 mg by mouth as needed.     levothyroxine (SYNTHROID, LEVOTHROID) 75 MCG tablet Take 75 mcg by mouth daily before breakfast.      macitentan (OPSUMIT) 10 MG tablet Take  1 tablet (10 mg total) by mouth daily. 30 tablet 11   metoprolol tartrate (LOPRESSOR) 25 MG tablet Take 1/2-1 tablet by mouth every 6 hours AS NEEDED for breakthrough afib HR over 100 45 tablet 1   MULTAQ 400 MG tablet TAKE 1 TABLET (400 MG TOTAL) BY MOUTH 2 (TWO) TIMES DAILY WITH A MEAL. 180 tablet 1   Multiple Vitamin (MULTIVITAMIN) capsule Take 1 capsule by mouth daily. chewable     ondansetron (ZOFRAN-ODT) 4 MG disintegrating tablet Take 1 tablet (4 mg total) by mouth every 8 (eight) hours as needed for nausea or vomiting. 20 tablet 0   pantoprazole (PROTONIX) 40 MG tablet TAKE 1 TABLET BY MOUTH EVERY DAY 90 tablet 2   potassium chloride SA (K-DUR) 20 MEQ tablet Take 20 mEq by mouth daily.     Selexipag (UPTRAVI) 1600 MCG TABS Take 1,600 mcg by mouth 2 (two) times daily.      UNABLE TO FIND Med Name: CPAP  DME-[facility]     vitamin B-12 100 MCG tablet Take 1 tablet (100 mcg total) by mouth daily. 30 tablet 0   COLCRYS 0.6 MG tablet TAKE 1 TABLET BY MOUTH DAILY 30  tablet 0   No current facility-administered medications for this encounter.     Allergies:   Amiodarone, Cellcept [mycophenolate mofetil], and Zyban [bupropion]   Social History:  The patient  reports that she quit smoking about 9 years ago. Her smoking use included cigarettes. She has a 22.00 pack-year smoking history. She has never used smokeless tobacco. She reports that she does not drink alcohol or use drugs.   Family History:  The patient's family history includes Breast cancer in her paternal aunt; Gout in her father; Heart disease in her sister; Lung cancer in her father.   ROS:  Please see the history of present illness.   All other systems are personally reviewed and negative.   Exam:   BP 118/79    Pulse 61    Wt 83 kg (183 lb)    SpO2 96%    BMI 32.42 kg/m  General: NAD Neck: JVP 7-8 cm, no thyromegaly or thyroid nodule.  Lungs: Clear to auscultation bilaterally with normal respiratory effort. CV: Nondisplaced PMI.  Heart regular S1/S2, no S3/S4, no murmur.  No peripheral edema.  No carotid bruit.  Normal pedal pulses.  Abdomen: Soft, nontender, no hepatosplenomegaly, no distention.  Skin: Intact without lesions or rashes.  Neurologic: Alert and oriented x 3.  Psych: Normal affect. Extremities: No clubbing or cyanosis.  HEENT: Normal.    Recent Labs: 09/15/2018: ALT 19; Hemoglobin 11.6; Platelets 486.0 10/14/2018: B Natriuretic Peptide 188.3; BUN 14; Creatinine, Ser 1.13; Potassium 4.5; Sodium 139  Personally reviewed   Wt Readings from Last 3 Encounters:  10/14/18 83 kg (183 lb)  09/03/18 79.4 kg (175 lb)  08/25/18 81.6 kg (180 lb)      ASSESSMENT AND PLAN:  1. Chronic diastolic CHF: Echo was done today and reviewed, showing EF 65-70% in apical HCM pattern, normal RV size and systolic function.  She has not been taking Lasix.  She does not appear volume overloaded on exam and weight is stable.   - I think it is reasonable to have her use Lasix only prn.  - Check  BNP.    2. Apical hypertrophic cardiomyopathy: Cardiac MRI in 7/16 showed mid-apical LV hypertrophy with vigorous contraction. Now off metoprolol with slow HR (uses metoprolol prn tachycardia - atrial fibrillation).  ECG is consistent with apical  HCM.   3. Atrial fibrillation:  She remains on on Multaq, no palpitations.  Per Dr Rayann Heman, would not re-do her ablation. Breakthrough atrial fibrillation in 7/19, DCCV back to NSR in ER.  Breakthrough atypical atrial flutter in 5/20, DCCV back to NSR in the ER.  - Continue Eliquis.  No further overt GI bleeding.    - Continue dronedarone.  She is not volume overloaded on exam so I think that it is reasonable to continue dronedarone for now unless CHF becomes difficult to manage. She is not a candidate for redo ablation and does not have other anti-arrhythmic options per Dr. Rayann Heman.  4. Pulmonary hypertension: RHC in 3/18 showed moderate pulmonary arterial hypertension with elevated PVR (7.3 WU by Fick, 9.6 WU by thermo). She has COPD but I do not think this plays a large role in the pulmonary hypertension =>  PFTs in 3/18 showed minimal obstruction and restriction, there was significantly decreased DLCO, this may be due to pulmonary vascular disease.  OSA may play a role but not a large one. V/Q scan did not suggest chronic PEs in 3/18.  RF borderline elevated (probably not significant), HIV negative, Anti-SCL70 negative, ANA negative, P-ANCA weakly positive.  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  Her RHC in 3/19 showed lower PA pressure and PVR with normal right and left heart filling pressures, but cardiac output was lower than prior.  Surprisingly, her RV looked normal on echo so I question how accurate the cardiac output measurement really was.  Bubble study negative on echo.  I did a high resolution CT to look for evidence of ILD, but it seems to have been degraded by respiratory artifact, possible  nonspecific interstitial pneumonitis.  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  Echo today showed normal RV size and systolic function and PASP 39 mmHg.  6 minute walk it fairly stable.  - Also of note, she has a history of afib ablation so pulmonary vein stenosis certainly remains a concern for raising PA pressure, but I think this is unlikely as the cause for her presentation.   - Continue Opsumit.     - She did not tolerate Adcirca or Adempas.   - Continue Selexipag 1600 bid.  - Check BNP today.  5. Anti-synthetase syndrome: Elevated CK, positive myositis panel, nonspecific interstitial pneumonia. Being seen by rheumatology at [facility], on azathioprine now.    Recommended follow-up:  3 months   Signed, [name], MD  10/15/2018  [place] [address] [facility] [place] [address] [phone] (office) [phone] (fax)

## 2018-11-08 ENCOUNTER — Other Ambulatory Visit (HOSPITAL_COMMUNITY): Payer: Self-pay | Admitting: Nurse Practitioner

## 2018-11-13 ENCOUNTER — Other Ambulatory Visit (HOSPITAL_COMMUNITY): Payer: Self-pay | Admitting: Nurse Practitioner

## 2018-11-18 DIAGNOSIS — I270 Primary pulmonary hypertension: Secondary | ICD-10-CM | POA: Diagnosis not present

## 2018-11-18 DIAGNOSIS — Z79899 Other long term (current) drug therapy: Secondary | ICD-10-CM | POA: Diagnosis not present

## 2018-11-18 DIAGNOSIS — Z87891 Personal history of nicotine dependence: Secondary | ICD-10-CM | POA: Diagnosis not present

## 2018-11-18 DIAGNOSIS — J849 Interstitial pulmonary disease, unspecified: Secondary | ICD-10-CM | POA: Diagnosis not present

## 2018-11-24 IMAGING — DX DG CHEST 2V
2 series · 2 of 2 positions shown · non-contrast
Comparison: Radiograph 10/06/2017.  CT 06/08/2017

CLINICAL DATA: Shortness of breath.  Chest discomfort.

EXAM:
CHEST - 2 VIEW

[chest pa]
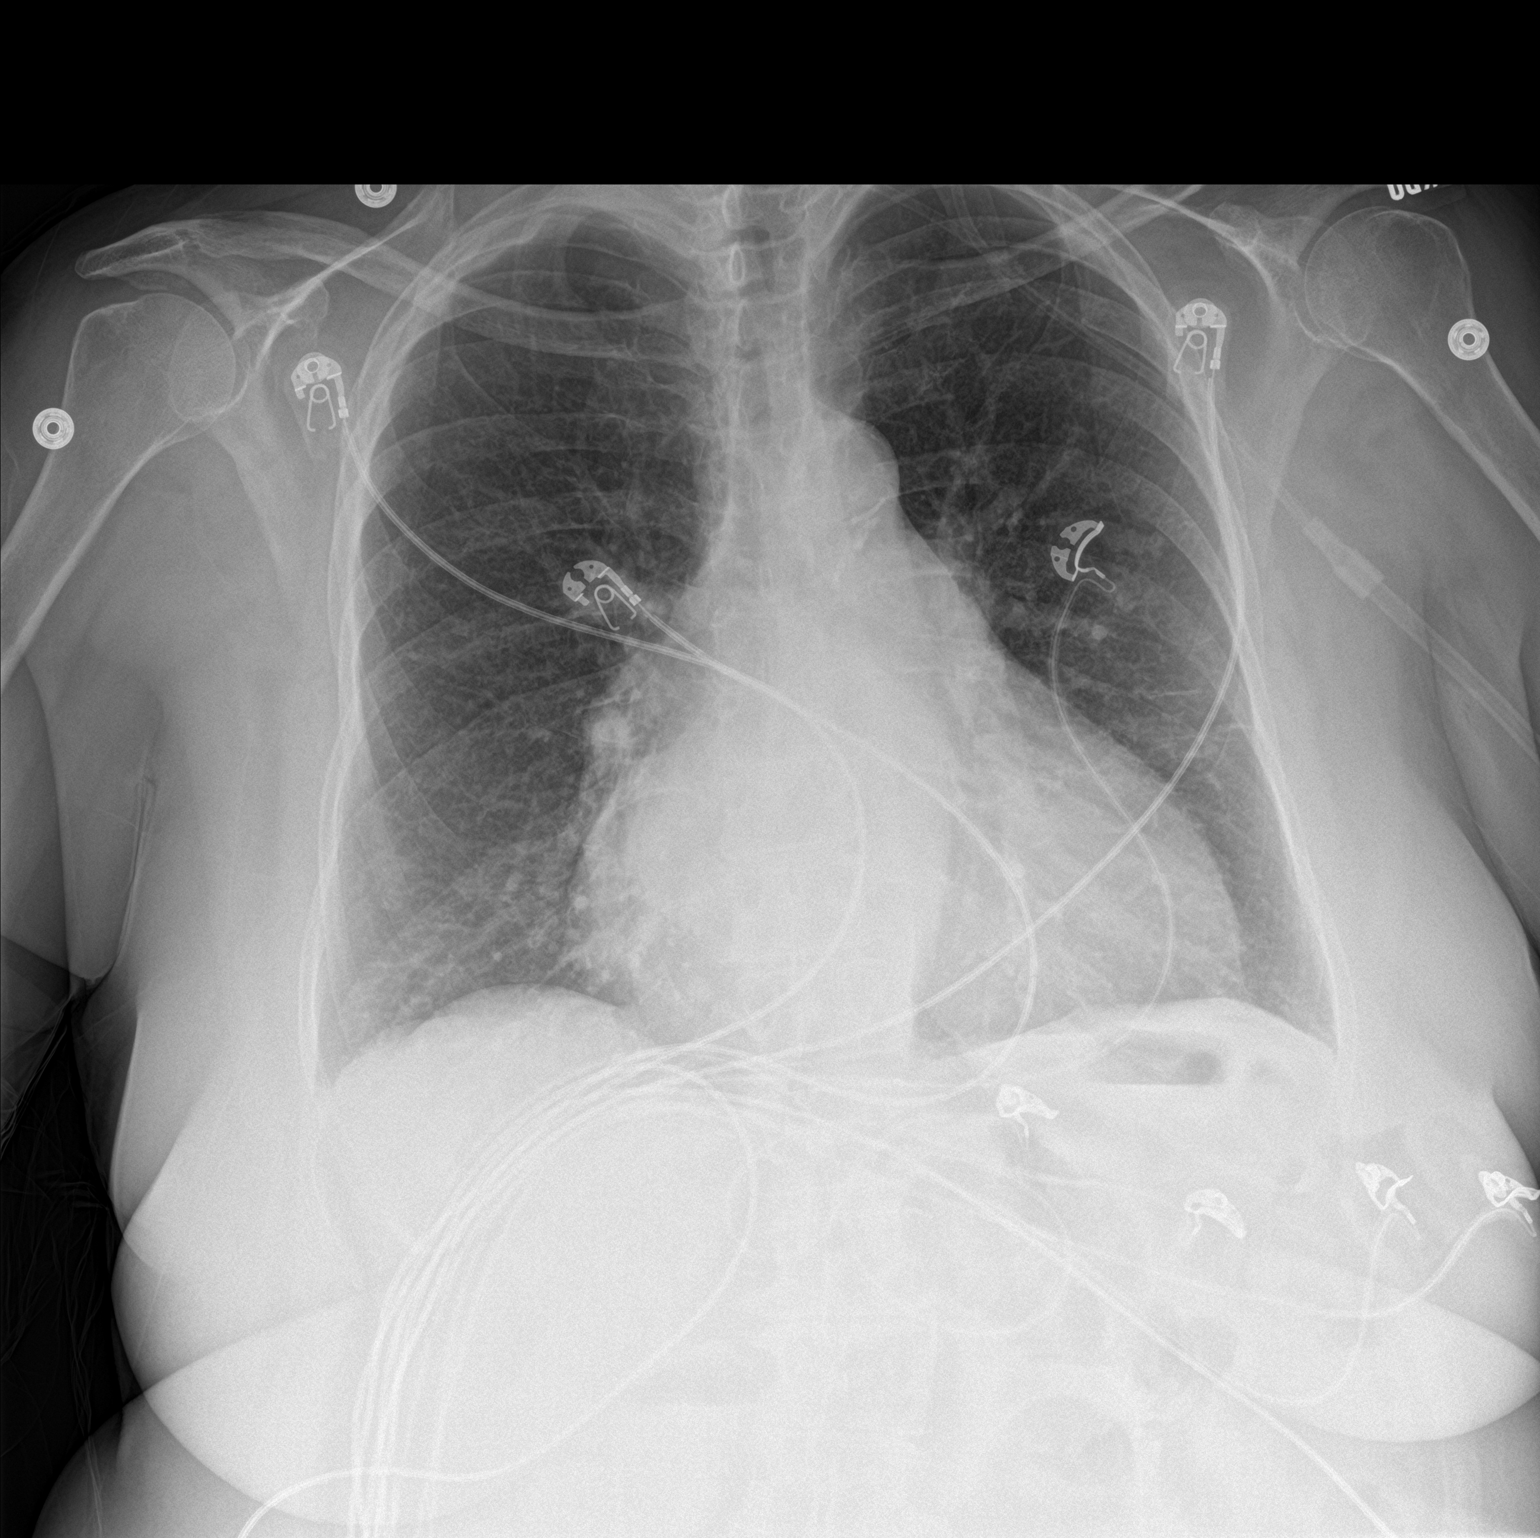

[chest lat]
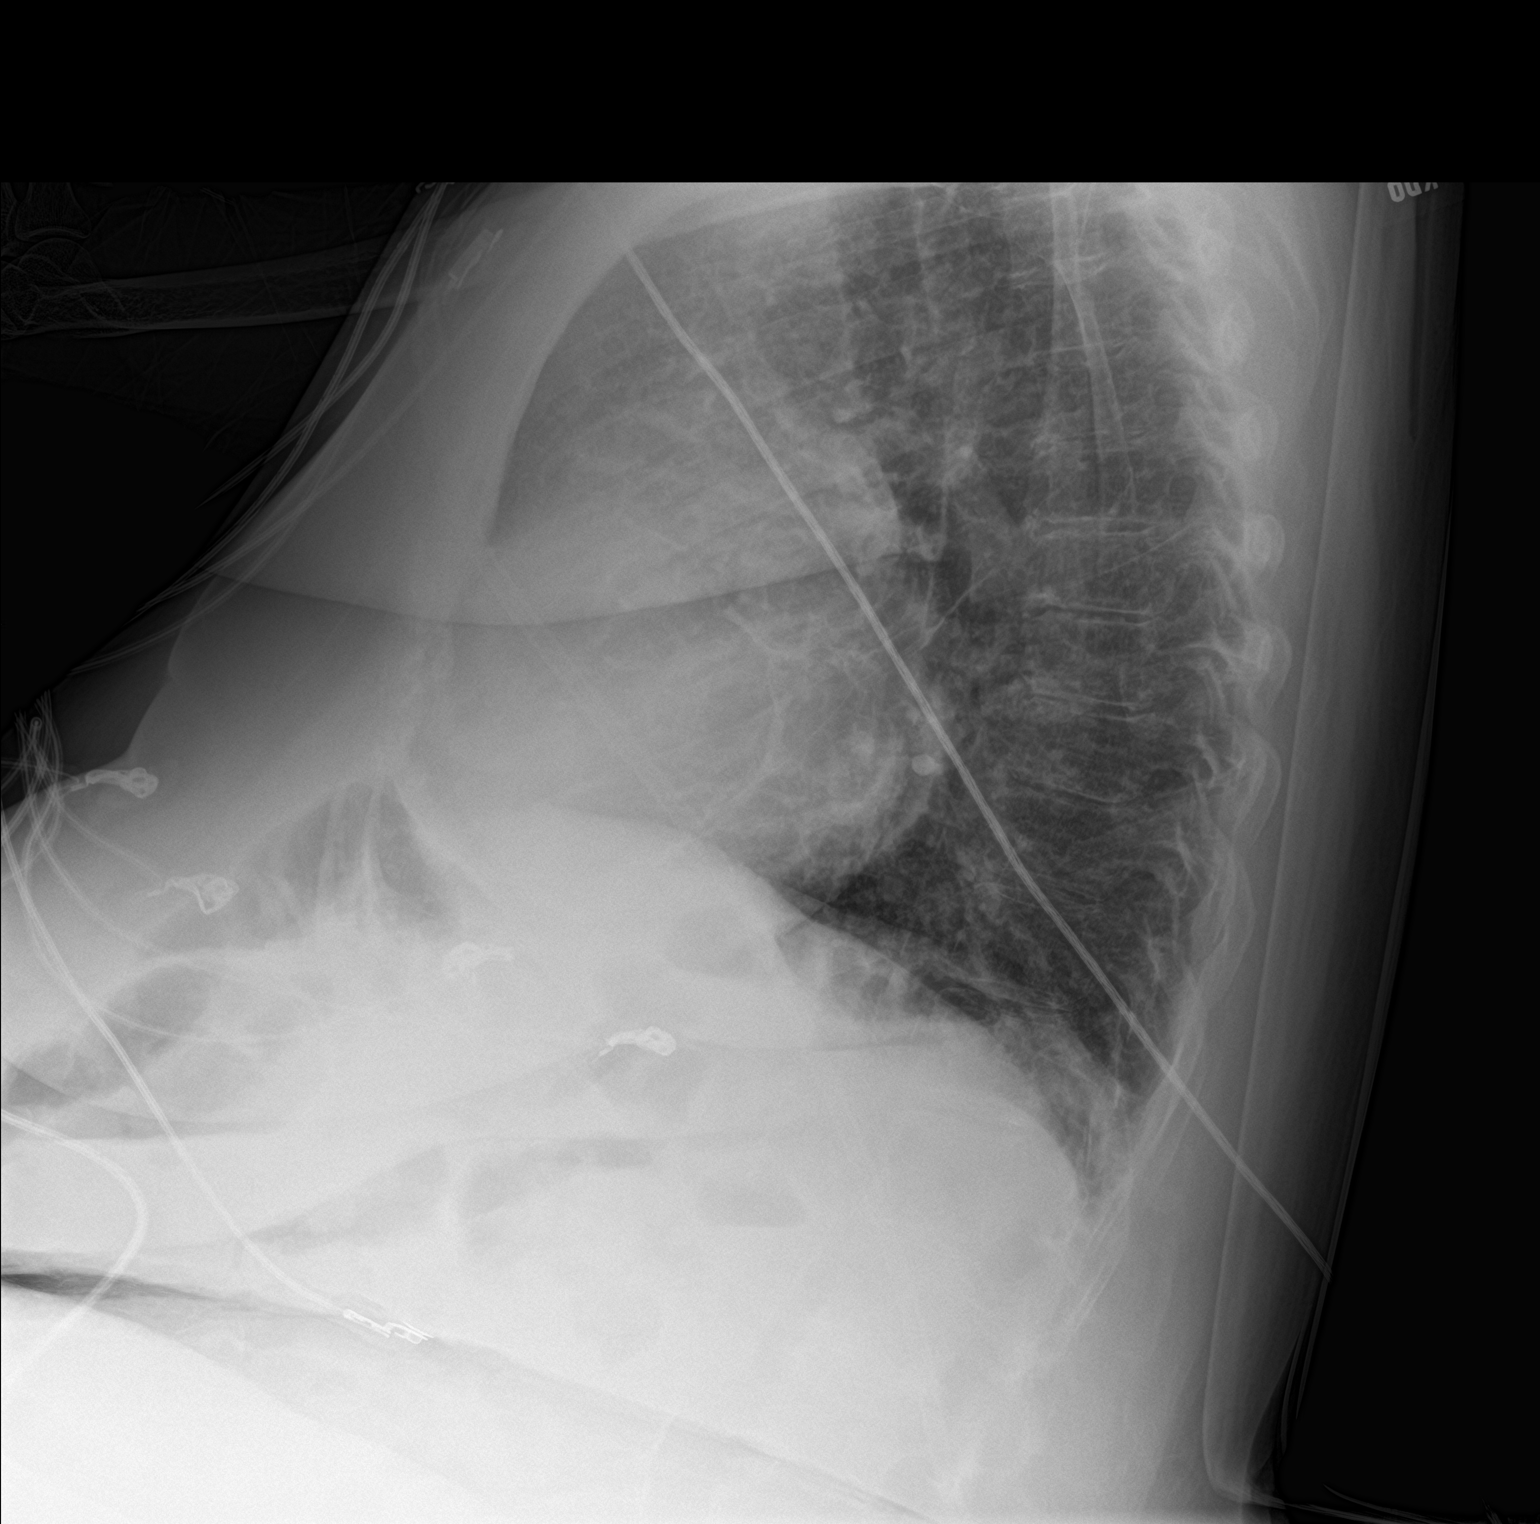

[2 of 2 positions shown; findings below may reference images not displayed]

FINDINGS: Cardiomegaly is unchanged from prior exam. Unchanged mediastinal
contours with aortic atherosclerosis. No pulmonary edema. Scarring
in the left mid lung. No focal airspace disease or pleural effusion.
No pneumothorax. Unchanged osseous structures.
IMPRESSION: Chronic cardiomegaly and Aortic Atherosclerosis (OS12R-URH.H). No
CHF or acute pulmonary process.

## 2018-11-30 DIAGNOSIS — K529 Noninfective gastroenteritis and colitis, unspecified: Secondary | ICD-10-CM | POA: Diagnosis not present

## 2018-11-30 DIAGNOSIS — D649 Anemia, unspecified: Secondary | ICD-10-CM | POA: Diagnosis not present

## 2018-12-06 ENCOUNTER — Other Ambulatory Visit (HOSPITAL_COMMUNITY): Payer: Self-pay

## 2018-12-06 MED ORDER — UPTRAVI 1600 MCG PO TABS: 1600.0000 ug | ORAL_TABLET | Freq: Two times a day (BID) | ORAL | 1 refills | Status: DC

## 2018-12-09 ENCOUNTER — Other Ambulatory Visit (HOSPITAL_COMMUNITY): Payer: Self-pay

## 2018-12-09 DIAGNOSIS — J849 Interstitial pulmonary disease, unspecified: Secondary | ICD-10-CM

## 2018-12-20 ENCOUNTER — Ambulatory Visit (HOSPITAL_COMMUNITY)
Admission: RE | Admit: 2018-12-20 | Discharge: 2018-12-20 | Disposition: A | Discharge status: Home / Self Care | Payer: Medicare Other | Source: Ambulatory Visit | Attending: Cardiology | Admitting: Cardiology

## 2018-12-20 ENCOUNTER — Other Ambulatory Visit: Payer: Self-pay

## 2018-12-20 DIAGNOSIS — J849 Interstitial pulmonary disease, unspecified: Secondary | ICD-10-CM | POA: Insufficient documentation

## 2018-12-23 ENCOUNTER — Telehealth (HOSPITAL_COMMUNITY): Payer: Self-pay

## 2018-12-23 NOTE — Telephone Encounter (Signed)
-----  Message from Larey Dresser, MD sent at 12/20/2018 10:28 PM EDT ----- No fibrotic interstitial lung disease noted.

## 2018-12-23 NOTE — Telephone Encounter (Signed)
Pt aware of results. Sent to Dr Twanna Hy at [facility] at [phone]

## 2019-01-04 ENCOUNTER — Other Ambulatory Visit (HOSPITAL_COMMUNITY): Payer: Self-pay | Admitting: *Deleted

## 2019-01-04 DIAGNOSIS — Z6834 Body mass index (BMI) 34.0-34.9, adult: Secondary | ICD-10-CM | POA: Diagnosis not present

## 2019-01-04 DIAGNOSIS — E039 Hypothyroidism, unspecified: Secondary | ICD-10-CM | POA: Diagnosis not present

## 2019-01-04 DIAGNOSIS — Z23 Encounter for immunization: Secondary | ICD-10-CM | POA: Diagnosis not present

## 2019-01-04 DIAGNOSIS — I10 Essential (primary) hypertension: Secondary | ICD-10-CM | POA: Diagnosis not present

## 2019-01-04 DIAGNOSIS — I272 Pulmonary hypertension, unspecified: Secondary | ICD-10-CM | POA: Diagnosis not present

## 2019-01-04 DIAGNOSIS — I509 Heart failure, unspecified: Secondary | ICD-10-CM | POA: Diagnosis not present

## 2019-01-04 DIAGNOSIS — E669 Obesity, unspecified: Secondary | ICD-10-CM | POA: Diagnosis not present

## 2019-01-04 DIAGNOSIS — Z0000 Encounter for general adult medical examination without abnormal findings: Secondary | ICD-10-CM | POA: Diagnosis not present

## 2019-01-04 DIAGNOSIS — I480 Paroxysmal atrial fibrillation: Secondary | ICD-10-CM | POA: Diagnosis not present

## 2019-01-04 MED ORDER — APIXABAN 5 MG PO TABS: 5.0000 mg | ORAL_TABLET | Freq: Two times a day (BID) | ORAL | 1 refills | Status: DC

## 2019-01-04 MED ORDER — APIXABAN 5 MG PO TABS: 5.0000 mg | ORAL_TABLET | Freq: Two times a day (BID) | ORAL | 3 refills | Status: DC

## 2019-01-05 ENCOUNTER — Telehealth (HOSPITAL_COMMUNITY): Payer: Self-pay

## 2019-01-05 NOTE — Telephone Encounter (Signed)
Received message from office staff as patients PCP called requesting an appt in the office for patient.  They noted patient being in AFIB x1 week.  Called patient, pt notes that she has been trying to manage with her medicines.  HR as high as 120's at home at times.  Pt denies chest pain, or sob however endorses feeling like her heart is racing as well as more tired than usual.    Routed to NP

## 2019-01-05 NOTE — Telephone Encounter (Signed)
Pt is followed in a-fib for her a-fib, per Darrick Grinder, NP please see if they will see pt tomorrow since issue is for a-fib, message sent to them.

## 2019-01-05 NOTE — Telephone Encounter (Signed)
PCP office contacted [facility] to schedule appt on patient's behalf.  Caller advised that PT is currently in AFIB and has been in AFIB since 12/28/18.  PCP has requested that PT see Dr. Aundra Dubin ASAP.    Transferred caller to Triage for appropriate handling/scheduling.

## 2019-01-05 NOTE — Telephone Encounter (Addendum)
Dr. Radene Ou office called back to AF clinic stating pt is "too complex" to see an extender only wants pt seen by Dr. Rayann Heman. Office number to [employer] provided to representative from [employer].

## 2019-01-05 NOTE — Telephone Encounter (Signed)
F/U tomorrow with an EKG.  Please make sure she is taking her eliquis twice a day.   Amy Clegg NP-C  3:29 PM

## 2019-01-05 NOTE — Telephone Encounter (Signed)
Spoke with patient again, advised message sent to Dr Bonita Quin office to try to get her an appt soon.  Will f/u tomorrow.

## 2019-01-06 DIAGNOSIS — Z950 Presence of cardiac pacemaker: Secondary | ICD-10-CM

## 2019-01-06 HISTORY — DX: Presence of cardiac pacemaker: Z95.0

## 2019-01-06 NOTE — Telephone Encounter (Signed)
Pt will see afib clinic tomorrow at 10:00am and Dr.McLean will see her as well with Ceasar Lund

## 2019-01-07 ENCOUNTER — Encounter (HOSPITAL_COMMUNITY): Payer: Self-pay | Admitting: Nurse Practitioner

## 2019-01-07 ENCOUNTER — Other Ambulatory Visit: Payer: Self-pay

## 2019-01-07 ENCOUNTER — Ambulatory Visit (HOSPITAL_COMMUNITY)
Admission: RE | Admit: 2019-01-07 | Discharge: 2019-01-07 | Disposition: A | Discharge status: Home / Self Care | Payer: Medicare Other | Source: Ambulatory Visit | Attending: Nurse Practitioner | Admitting: Nurse Practitioner

## 2019-01-07 ENCOUNTER — Other Ambulatory Visit (HOSPITAL_COMMUNITY)
Admission: RE | Admit: 2019-01-07 | Discharge: 2019-01-07 | Disposition: A | Discharge status: Home / Self Care | Payer: Medicare Other | Source: Ambulatory Visit | Attending: Cardiology | Admitting: Cardiology

## 2019-01-07 VITALS — BP 110/88 | HR 129 | Ht 63.0 in | Wt 188.4 lb

## 2019-01-07 DIAGNOSIS — J449 Chronic obstructive pulmonary disease, unspecified: Secondary | ICD-10-CM | POA: Diagnosis not present

## 2019-01-07 DIAGNOSIS — I5032 Chronic diastolic (congestive) heart failure: Secondary | ICD-10-CM | POA: Insufficient documentation

## 2019-01-07 DIAGNOSIS — G4733 Obstructive sleep apnea (adult) (pediatric): Secondary | ICD-10-CM | POA: Diagnosis not present

## 2019-01-07 DIAGNOSIS — I4892 Unspecified atrial flutter: Secondary | ICD-10-CM | POA: Diagnosis not present

## 2019-01-07 DIAGNOSIS — Z01818 Encounter for other preprocedural examination: Secondary | ICD-10-CM | POA: Insufficient documentation

## 2019-01-07 DIAGNOSIS — I272 Pulmonary hypertension, unspecified: Secondary | ICD-10-CM | POA: Diagnosis not present

## 2019-01-07 DIAGNOSIS — N183 Chronic kidney disease, stage 3 unspecified: Secondary | ICD-10-CM | POA: Insufficient documentation

## 2019-01-07 DIAGNOSIS — E039 Hypothyroidism, unspecified: Secondary | ICD-10-CM | POA: Insufficient documentation

## 2019-01-07 DIAGNOSIS — I480 Paroxysmal atrial fibrillation: Secondary | ICD-10-CM | POA: Diagnosis not present

## 2019-01-07 DIAGNOSIS — H3530 Unspecified macular degeneration: Secondary | ICD-10-CM | POA: Insufficient documentation

## 2019-01-07 DIAGNOSIS — E785 Hyperlipidemia, unspecified: Secondary | ICD-10-CM | POA: Diagnosis not present

## 2019-01-07 DIAGNOSIS — Z20828 Contact with and (suspected) exposure to other viral communicable diseases: Secondary | ICD-10-CM | POA: Insufficient documentation

## 2019-01-07 DIAGNOSIS — I2510 Atherosclerotic heart disease of native coronary artery without angina pectoris: Secondary | ICD-10-CM | POA: Insufficient documentation

## 2019-01-07 DIAGNOSIS — Z87891 Personal history of nicotine dependence: Secondary | ICD-10-CM | POA: Diagnosis not present

## 2019-01-07 DIAGNOSIS — I130 Hypertensive heart and chronic kidney disease with heart failure and stage 1 through stage 4 chronic kidney disease, or unspecified chronic kidney disease: Secondary | ICD-10-CM | POA: Insufficient documentation

## 2019-01-07 MED ORDER — METOPROLOL SUCCINATE ER 25 MG PO TB24: 25.0000 mg | ORAL_TABLET | Freq: Every day | ORAL | 6 refills | Status: DC

## 2019-01-07 NOTE — Patient Instructions (Signed)
Cardioversion scheduled for Tuesday, October 6th  - Arrive at the [facility] and go to admitting at 1:30PM  -Do not eat or drink anything after midnight the night prior to your procedure.  - Take all your morning medication with a sip of water prior to arrival.  - You will not be able to drive home after your procedure.   Start metoprolol 76m once a day with supper  Start lasix 221monce a day

## 2019-01-07 NOTE — H&P (View-Only) (Signed)
  Primary Care Physician: Rankins, Victoria R, MD Primary Cardiologist: McLean Primary Electrophysiologist: Klein/Allred   Amy [name] is a 73 y.o. female with a history of COPD, OSA on CPAP, apical hypertrophic cardiomyopathy, pulmonary hypertension, chronic diastolic CHF, and paroxysmal atrial fibrillation who presents for follow up form PCP being seen one week ago for  breakthrough a fib with RVR and intermittent soft BP. She underwent AF ablation by Dr Allred in 2017 and had done well until recently being maintained on multaq. She has had  2 cardioversion's this year, last one in June. Over the last  week, pt has taken  metoprolol tartrate 1-3 doses a day as needed but is not taking consistently on a daily basis. The one day that she did take 3x, she felt more comfortable as her HR was running less thatn 100. Her BP held with this dose.  On last visit with Dr. Allred,  5/29,  verbatium from his note...  " severe LA enlargement with extensive atriopathy and multiple atypical atrial flutter circuits, not amenable to ablation 2017.  Conservative management is advised. Continue multaq at this time. I would not advise further ablation for this patient.  She would be very high risk given low BP and chronic O2/ lung disease and success rates would be low. She really does not have additional AAD options.  Ultimately, rate control may be our only sustainable option."  I discussed pt with Dr. Allred and Dr. McLean today. Dr. McLean would like to try one more cardioversion. If this does not hold, then Dr. Allred suggests stopping Multaq, adding daily metoprolol and adding digoxin. PharmD suggest 0.125 mg daily with trough level in one week after start of drug, if this come to fruition.   Today denies symptoms of chest pain, orthopnea, PND, presyncope, syncope, snoring, daytime somnolence, bleeding, or neurologic sequela. The patient is tolerating medications without difficulties and is otherwise  without complaint today.    Past Medical History:  Diagnosis Date  . Antisynthetase syndrome (HCC)   . Apical variant hypertrophic cardiomyopathy (HCC)    Diagnosed by echo and ECG 11/13  . Atypical atrial flutter (HCC)   . CAD (coronary artery disease)    a. LHC in 10/2011 demonstrated non-obstructive disease and an 80% lesion in a small D1 treated medically.   . CHF (congestive heart failure) (HCC)   . Chronic diastolic heart failure (HCC)   . CKD (chronic kidney disease), stage III   . COPD (chronic obstructive pulmonary disease) (HCC)   . Dizziness    had PT for being off balance - in approx. 2012  . GERD (gastroesophageal reflux disease)   . Hyperlipidemia   . Hypertension   . Hypothyroid   . Lumbar disc disease   . Macular degeneration   . OSA (obstructive sleep apnea)   . Persistent atrial fibrillation (HCC)       . Pulmonary hypertension (HCC)    a. [facility] 05/2013 showed mild PAH with normal PCWP and RA pressure, could be related to OSA and low oxygen saturation.   . Refusal of blood transfusions as patient is Jehovah's Witness   . Sinus bradycardia    Past Surgical History:  Procedure Laterality Date  . BIOPSY  12/03/2017   Procedure: BIOPSY;  Surgeon: Brahmbhatt, Parag, MD;  [facility];  Service: Gastroenterology;;  . CARDIAC CATHETERIZATION     normal coronary arteries  . CARDIAC CATHETERIZATION N/A 10/31/2014   Procedure: Left Heart Cath and Coronary Angiography;  Surgeon:   David W Harding, MD;  [facility];  Service: Cardiovascular;  Laterality: N/A;  . CATARACT EXTRACTION Bilateral   . CESAREAN SECTION  1983  . COLONOSCOPY WITH PROPOFOL N/A 12/03/2017   Procedure: COLONOSCOPY WITH PROPOFOL;  Surgeon: Brahmbhatt, Parag, MD;  [facility];  Service: Gastroenterology;  Laterality: N/A;  . ELECTROPHYSIOLOGIC STUDY N/A 04/24/2015   Procedure: Atrial Fibrillation Ablation;  Surgeon: James Allred, MD;  [facility];   Service: Cardiovascular;  Laterality: N/A;  . FLEXIBLE SIGMOIDOSCOPY N/A 11/30/2017   Procedure: FLEXIBLE SIGMOIDOSCOPY;  Surgeon: Brahmbhatt, Parag, MD;  [facility];  Service: Gastroenterology;  Laterality: N/A;  . RIGHT HEART CATH N/A 06/25/2016   Procedure: Right Heart Cath;  Surgeon: Michael Cooper, MD;  [facility];  Service: Cardiovascular;  Laterality: N/A;  . RIGHT HEART CATH N/A 06/10/2017   Procedure: RIGHT HEART CATH;  Surgeon: McLean, Dalton S, MD;  [facility];  Service: Cardiovascular;  Laterality: N/A;  . RIGHT HEART CATHETERIZATION N/A 06/01/2013   Procedure: RIGHT HEART CATH;  Surgeon: Dalton S McLean, MD;  [facility];  Service: Cardiovascular;  Laterality: N/A;  . TEE WITHOUT CARDIOVERSION N/A 04/23/2015   Procedure: TRANSESOPHAGEAL ECHOCARDIOGRAM (TEE);  Surgeon: Peter C Nishan, MD;  [facility];  Service: Cardiovascular;  Laterality: N/A;    Current Outpatient Medications  Medication Sig Dispense Refill  . acetaminophen (TYLENOL) 325 MG tablet Take 2 tablets (650 mg total) by mouth every 4 (four) hours as needed for headache or mild pain.    . apixaban (ELIQUIS) 5 MG TABS tablet Take 1 tablet (5 mg total) by mouth 2 (two) times daily. 60 tablet 3  . atorvastatin (LIPITOR) 80 MG tablet Take 1 tablet (80 mg total) by mouth daily with lunch. 90 tablet 3  . azaTHIOprine (IMURAN) 50 MG tablet Take 100 mg by mouth daily.    . Cholecalciferol (VITAMIN D) 2000 UNITS CAPS Take 2,000 Units by mouth daily.     . COLCRYS 0.6 MG tablet TAKE 1 TABLET BY MOUTH DAILY 30 tablet 0  . ezetimibe (ZETIA) 10 MG tablet Take 10 mg by mouth daily.    . furosemide (LASIX) 20 MG tablet Take 20 mg by mouth daily.    . levothyroxine (SYNTHROID, LEVOTHROID) 75 MCG tablet Take 75 mcg by mouth daily before breakfast.     . macitentan (OPSUMIT) 10 MG tablet Take 1 tablet (10 mg total) by mouth daily. 30 tablet 11  . metoprolol tartrate  (LOPRESSOR) 25 MG tablet Take 1/2-1 tablet by mouth every 6 hours AS NEEDED for breakthrough afib HR over 100 45 tablet 1  . MULTAQ 400 MG tablet TAKE 1 TABLET BY MOUTH TWICE A DAY WITH MEALS 180 tablet 1  . Multiple Vitamin (MULTIVITAMIN) capsule Take 1 capsule by mouth daily. chewable    . ondansetron (ZOFRAN-ODT) 4 MG disintegrating tablet Take 1 tablet (4 mg total) by mouth every 8 (eight) hours as needed for nausea or vomiting. 20 tablet 0  . pantoprazole (PROTONIX) 40 MG tablet TAKE 1 TABLET BY MOUTH EVERY DAY 90 tablet 2  . potassium chloride SA (K-DUR) 20 MEQ tablet Take 20 mEq by mouth daily.    . Selexipag (UPTRAVI) 1600 MCG TABS Take 1,600 mcg by mouth 2 (two) times daily. 60 tablet 1  . UNABLE TO FIND Med Name: CPAP  [facility]    . vitamin B-12 100 MCG tablet Take 1 tablet (100 mcg total) by   mouth daily. 30 tablet 0  . metoprolol succinate (TOPROL XL) 25 MG 24 hr tablet Take 1 tablet (25 mg total) by mouth daily with supper. 30 tablet 6   No current facility-administered medications for this encounter.     Allergies  Allergen Reactions  . Amiodarone     Dyspnea - felt to be 2/2 amio lung toxicity Has tolerated Omnipaque   . Cellcept [Mycophenolate Mofetil] Other (See Comments)    Gastric Bleeding  . Zyban [Bupropion] Itching    Social History   Socioeconomic History  . Marital status: Widowed    Spouse name: Not on file  . Number of children: 2  . Years of education: Not on file  . Highest education level: Not on file  Occupational History  . Occupation: retired. still works for [employer].   Social Needs  . Financial resource strain: Not hard at all  . Food insecurity    Worry: Never true    Inability: Never true  . Transportation needs    Medical: No    Non-medical: No  Tobacco Use  . Smoking status: Former Smoker    Packs/day: 0.50    Years: 44.00    Pack years: 22.00    Types: Cigarettes    Quit date: 04/07/2009    Years since quitting: 9.7  . Smokeless  tobacco: Never Used  Substance and Sexual Activity  . Alcohol use: No  . Drug use: No  . Sexual activity: Never  Lifestyle  . Physical activity    Days per week: Not on file    Minutes per session: Not on file  . Stress: Not on file  Relationships  . Social connections    Talks on phone: Not on file    Gets together: Not on file    Attends religious service: Not on file    Active member of club or organization: Not on file    Attends meetings of clubs or organizations: Not on file    Relationship status: Not on file  . Intimate partner violence    Fear of current or ex partner: Not on file    Emotionally abused: Not on file    Physically abused: Not on file    Forced sexual activity: Not on file  Other Topics Concern  . Not on file  Social History Narrative   Pt lives alone with 2 dogs.     Family History  Problem Relation Age of Onset  . Heart disease Sister        Good Pastures Syndrome  . Gout Father   . Lung cancer Father        smoked  . Breast cancer Paternal Aunt    The patient does not have a history of early familial atrial fibrillation or other arrhythmias.  ROS- All systems are reviewed and negative except as per the HPI above.  Physical Exam: Vitals:   01/07/19 1003  BP: 110/88  Pulse: (!) 129  Weight: 85.5 kg  Height: 5' 3" (1.6 m)    GEN- The patient is chronically ill appearing, alert and oriented x 3 today. O2 via [place]  Head- normocephalic, atraumatic Eyes-  Sclera clear, conjunctiva pink Ears- hearing intact Oropharynx- clear Neck- supple  Lungs- Clear to ausculation bilaterally, normal work of breathing Heart- Rapid irregular rate and rhythm  GI- soft, NT, ND, + BS Extremities- no clubbing, cyanosis, or edema MS- no significant deformity or atrophy Skin- no rash or lesion Psych- euthymic mood, full affect Neuro-   strength and sensation are intact  Wt Readings from Last 3 Encounters:  01/07/19 85.5 kg  10/14/18 83 kg  09/03/18 79.4 kg     EKG today demonstrates  Aflutter at 129 bpm, 2:1, qrs int 70 ms, qtc 503 ms  Epic records are reviewed at length today  Assessment and Plan:  1. Paroxsymal  atrial fibrillation The patient has symptomatic atrial fibrillation. She is status post AF ablation in 2017. She had been doing well without recurrence until the last few months . Unfortunately, her options are limited, Multaq is her only antiarrythmic choice as she has had long qtc when tried on tikosyn,  in the [place] area , amiodarone is contraindicated 2/2 with her  lung disease The consensus with Dr. McLean/Dr. Allred  today is that she will be scheduleded for cardioversion 10/6 with Dr. McLean  Anticoagulation has been  uninterrupted x 3 weeks   Continue Multaq 400 mg bid  She will be started on toprol xl  25 mg daily and Dr. Mclean would also like her to start lasix 20 mg daily   IF ERAF, Multaq will be stopped and allowed to wash out x 3 days, then add digoxin 0.125 mg qd with low dose metoprolol, trough level in one week   She may be a PPM/AVnodal ablation candidate as last option if HR's cannot be controlled   Labs form PCP office drawn 01/04/19 show a creatinine of 1.25, BUN of 22 with a  K+ of 4.7 , NA 138 CBC reveals H/H of 12.1/37.5, WBC normal at 5.0, TSH 7.23 and BNP of 113   F/u in afib clinic in one week   Markiya Keefe C. Capucine Tryon, ANP-C [facility] [address] [place], [place] [address] [phone]  

## 2019-01-07 NOTE — Progress Notes (Signed)
Primary Care Physician: Aretta Nip, MD Primary Cardiologist: Aundra Dubin Primary Electrophysiologist: Klein/Allred   [name] is a 73 y.o. female with a history of COPD, OSA on CPAP, apical hypertrophic cardiomyopathy, pulmonary hypertension, chronic diastolic CHF, and paroxysmal atrial fibrillation who presents for follow up form PCP being seen one week ago for  breakthrough a fib with RVR and intermittent soft BP. She underwent AF ablation by Dr Rayann Heman in 2017 and had done well until recently being maintained on multaq. She has had  2 cardioversion's this year, last one in June. Over the last  week, pt has taken  metoprolol tartrate 1-3 doses a day as needed but is not taking consistently on a daily basis. The one day that she did take 3x, she felt more comfortable as her HR was running less thatn 100. Her BP held with this dose.  On last visit with Dr. Rayann Heman,  5/29,  verbatium from his note...  " severe LA enlargement with extensive atriopathy and multiple atypical atrial flutter circuits, not amenable to ablation 2017.  Conservative management is advised. Continue multaq at this time. I would not advise further ablation for this patient.  She would be very high risk given low BP and chronic O2/ lung disease and success rates would be low. She really does not have additional AAD options.  Ultimately, rate control may be our only sustainable option."  I discussed pt with Dr. Rayann Heman and Dr. Aundra Dubin today. Dr. Aundra Dubin would like to try one more cardioversion. If this does not hold, then Dr. Rayann Heman suggests stopping Multaq, adding daily metoprolol and adding digoxin. PharmD suggest 0.125 mg daily with trough level in one week after start of drug, if this come to fruition.   Today denies symptoms of chest pain, orthopnea, PND, presyncope, syncope, snoring, daytime somnolence, bleeding, or neurologic sequela. The patient is tolerating medications without difficulties and is otherwise  without complaint today.    Past Medical History:  Diagnosis Date  . Antisynthetase syndrome ([place])   . Apical variant hypertrophic cardiomyopathy ([place])    Diagnosed by echo and ECG 11/13  . Atypical atrial flutter ([place])   . CAD (coronary artery disease)    a. LHC in 10/2011 demonstrated non-obstructive disease and an 80% lesion in a small D1 treated medically.   . CHF (congestive heart failure) ([place])   . Chronic diastolic heart failure ([place])   . CKD (chronic kidney disease), stage III   . COPD (chronic obstructive pulmonary disease) ([place])   . Dizziness    had PT for being off balance - in approx. 2012  . GERD (gastroesophageal reflux disease)   . Hyperlipidemia   . Hypertension   . Hypothyroid   . Lumbar disc disease   . Macular degeneration   . OSA (obstructive sleep apnea)   . Persistent atrial fibrillation ([place])       . Pulmonary hypertension ([place])    a. [place] 05/2013 showed mild PAH with normal PCWP and RA pressure, could be related to OSA and low oxygen saturation.   . Refusal of blood transfusions as patient is Jehovah's Witness   . Sinus bradycardia    Past Surgical History:  Procedure Laterality Date  . BIOPSY  12/03/2017   Procedure: BIOPSY;  Surgeon: Otis Brace, MD;  [facility];  Service: Gastroenterology;;  . CARDIAC CATHETERIZATION     normal coronary arteries  . CARDIAC CATHETERIZATION N/A 10/31/2014   Procedure: Left Heart Cath and Coronary Angiography;  Surgeon:  Leonie Man, MD;  Location: [place] [facility];  Service: Cardiovascular;  Laterality: N/A;  . CATARACT EXTRACTION Bilateral   . CESAREAN SECTION  1983  . COLONOSCOPY WITH PROPOFOL N/A 12/03/2017   Procedure: COLONOSCOPY WITH PROPOFOL;  Surgeon: Otis Brace, MD;  Location: [place];  Service: Gastroenterology;  Laterality: N/A;  . ELECTROPHYSIOLOGIC STUDY N/A 04/24/2015   Procedure: Atrial Fibrillation Ablation;  Surgeon: [name], MD;  Location: [place] CV LAB;   Service: Cardiovascular;  Laterality: N/A;  . FLEXIBLE SIGMOIDOSCOPY N/A 11/30/2017   Procedure: FLEXIBLE SIGMOIDOSCOPY;  Surgeon: Otis Brace, MD;  Location: [place];  Service: Gastroenterology;  Laterality: N/A;  . RIGHT HEART CATH N/A 06/25/2016   Procedure: Right Heart Cath;  Surgeon: Sherren Mocha, MD;  Location: [place] [facility] LAB;  Service: Cardiovascular;  Laterality: N/A;  . RIGHT HEART CATH N/A 06/10/2017   Procedure: RIGHT HEART CATH;  Surgeon: Larey Dresser, MD;  Location: [place] CV LAB;  Service: Cardiovascular;  Laterality: N/A;  . RIGHT HEART CATHETERIZATION N/A 06/01/2013   Procedure: RIGHT HEART CATH;  Surgeon: Larey Dresser, MD;  [facility] CATH LAB;  Service: Cardiovascular;  Laterality: N/A;  . TEE WITHOUT CARDIOVERSION N/A 04/23/2015   Procedure: TRANSESOPHAGEAL ECHOCARDIOGRAM (TEE);  Surgeon: Josue Hector, MD;  Location: [facility] ENDOSCOPY;  Service: Cardiovascular;  Laterality: N/A;    Current Outpatient Medications  Medication Sig Dispense Refill  . acetaminophen (TYLENOL) 325 MG tablet Take 2 tablets (650 mg total) by mouth every 4 (four) hours as needed for headache or mild pain.    Marland Kitchen apixaban (ELIQUIS) 5 MG TABS tablet Take 1 tablet (5 mg total) by mouth 2 (two) times daily. 60 tablet 3  . atorvastatin (LIPITOR) 80 MG tablet Take 1 tablet (80 mg total) by mouth daily with lunch. 90 tablet 3  . azaTHIOprine (IMURAN) 50 MG tablet Take 100 mg by mouth daily.    . Cholecalciferol (VITAMIN D) 2000 UNITS CAPS Take 2,000 Units by mouth daily.     Marland Kitchen COLCRYS 0.6 MG tablet TAKE 1 TABLET BY MOUTH DAILY 30 tablet 0  . ezetimibe (ZETIA) 10 MG tablet Take 10 mg by mouth daily.    . furosemide (LASIX) 20 MG tablet Take 20 mg by mouth daily.    Marland Kitchen levothyroxine (SYNTHROID, LEVOTHROID) 75 MCG tablet Take 75 mcg by mouth daily before breakfast.     . macitentan (OPSUMIT) 10 MG tablet Take 1 tablet (10 mg total) by mouth daily. 30 tablet 11  . metoprolol tartrate  (LOPRESSOR) 25 MG tablet Take 1/2-1 tablet by mouth every 6 hours AS NEEDED for breakthrough afib HR over 100 45 tablet 1  . MULTAQ 400 MG tablet TAKE 1 TABLET BY MOUTH TWICE A DAY WITH MEALS 180 tablet 1  . Multiple Vitamin (MULTIVITAMIN) capsule Take 1 capsule by mouth daily. chewable    . ondansetron (ZOFRAN-ODT) 4 MG disintegrating tablet Take 1 tablet (4 mg total) by mouth every 8 (eight) hours as needed for nausea or vomiting. 20 tablet 0  . pantoprazole (PROTONIX) 40 MG tablet TAKE 1 TABLET BY MOUTH EVERY DAY 90 tablet 2  . potassium chloride SA (K-DUR) 20 MEQ tablet Take 20 mEq by mouth daily.    . Selexipag (UPTRAVI) 1600 MCG TABS Take 1,600 mcg by mouth 2 (two) times daily. 60 tablet 1  . UNABLE TO FIND Med Name: CPAP  D[facility]    . vitamin B-12 100 MCG tablet Take 1 tablet (100 mcg total) by  mouth daily. 30 tablet 0  . metoprolol succinate (TOPROL XL) 25 MG 24 hr tablet Take 1 tablet (25 mg total) by mouth daily with supper. 30 tablet 6   No current facility-administered medications for this encounter.     Allergies  Allergen Reactions  . Amiodarone     Dyspnea - felt to be 2/2 amio lung toxicity Has tolerated Omnipaque   . Cellcept [Mycophenolate Mofetil] Other (See Comments)    Gastric Bleeding  . Zyban [Bupropion] Itching    Social History   Socioeconomic History  . Marital status: Widowed    Spouse name: Not on file  . Number of children: 2  . Years of education: Not on file  . Highest education level: Not on file  Occupational History  . Occupation: retired. still works for [employer].   Social Needs  . Financial resource strain: Not hard at all  . Food insecurity    Worry: Never true    Inability: Never true  . Transportation needs    Medical: No    Non-medical: No  Tobacco Use  . Smoking status: Former Smoker    Packs/day: 0.50    Years: 44.00    Pack years: 22.00    Types: Cigarettes    Quit date: 04/07/2009    Years since quitting: 9.7  . Smokeless  tobacco: Never Used  Substance and Sexual Activity  . Alcohol use: No  . Drug use: No  . Sexual activity: Never  Lifestyle  . Physical activity    Days per week: Not on file    Minutes per session: Not on file  . Stress: Not on file  Relationships  . Social Herbalist on phone: Not on file    Gets together: Not on file    Attends religious service: Not on file    Active member of club or organization: Not on file    Attends meetings of clubs or organizations: Not on file    Relationship status: Not on file  . Intimate partner violence    Fear of current or ex partner: Not on file    Emotionally abused: Not on file    Physically abused: Not on file    Forced sexual activity: Not on file  Other Topics Concern  . Not on file  Social History Narrative   Pt lives alone with 2 dogs.     Family History  Problem Relation Age of Onset  . Heart disease Sister        Good Pastures Syndrome  . Gout Father   . Lung cancer Father        smoked  . Breast cancer Paternal Aunt    The patient does not have a history of early familial atrial fibrillation or other arrhythmias.  ROS- All systems are reviewed and negative except as per the HPI above.  Physical Exam: Vitals:   01/07/19 1003  BP: 110/88  Pulse: (!) 129  Weight: 85.5 kg  Height: _0  (1.6 m)    GEN- The patient is chronically ill appearing, alert and oriented x 3 today. O2 via [place]  Head- normocephalic, atraumatic Eyes-  Sclera clear, conjunctiva pink Ears- hearing intact Oropharynx- clear Neck- supple  Lungs- Clear to ausculation bilaterally, normal work of breathing Heart- Rapid irregular rate and rhythm  GI- soft, NT, ND, + BS Extremities- no clubbing, cyanosis, or edema MS- no significant deformity or atrophy Skin- no rash or lesion Psych- euthymic mood, full affect Neuro-  strength and sensation are intact  Wt Readings from Last 3 Encounters:  01/07/19 85.5 kg  10/14/18 83 kg  09/03/18 79.4 kg     EKG today demonstrates  Aflutter at 129 bpm, 2:1, qrs int 70 ms, qtc 503 ms  Epic records are reviewed at length today  Assessment and Plan:  1. Paroxsymal  atrial fibrillation The patient has symptomatic atrial fibrillation. She is status post AF ablation in 2017. She had been doing well without recurrence until the last few months . Unfortunately, her options are limited, Multaq is her only antiarrythmic choice as she has had long qtc when tried on tikosyn,  in the [place] area , amiodarone is contraindicated 2/2 with her  lung disease The consensus with Dr. McLean/Dr. Rayann Heman  today is that she will be scheduleded for cardioversion 10/6 with Dr. Aundra Dubin  Anticoagulation has been  uninterrupted x 3 weeks   Continue Multaq 400 mg bid  She will be started on toprol xl  25 mg daily and Dr. Aundra Dubin would also like her to start lasix 20 mg daily   IF ERAF, Multaq will be stopped and allowed to wash out x 3 days, then add digoxin 0.125 mg qd with low dose metoprolol, trough level in one week   She may be a PPM/AVnodal ablation candidate as last option if HR's cannot be controlled   Labs form PCP office drawn 01/04/19 show a creatinine of 1.25, BUN of 22 with a  K+ of 4.7 , NA 138 CBC reveals H/H of 12.1/37.5, WBC normal at 5.0, TSH 7.23 and BNP of 113   F/u in afib clinic in one week   Butch Penny C. Alonia Dibuono, [facility] [address] [place], [place] [address] [phone]

## 2019-01-09 LAB — NOVEL CORONAVIRUS, NAA (HOSP ORDER, SEND-OUT TO REF LAB; TAT 18-24 HRS): SARS-CoV-2, NAA: NOT DETECTED

## 2019-01-10 ENCOUNTER — Other Ambulatory Visit (HOSPITAL_COMMUNITY): Payer: Self-pay | Admitting: Cardiology

## 2019-01-11 ENCOUNTER — Ambulatory Visit (HOSPITAL_COMMUNITY): Payer: Medicare Other | Admitting: Certified Registered Nurse Anesthetist

## 2019-01-11 ENCOUNTER — Ambulatory Visit (HOSPITAL_COMMUNITY)
Admission: RE | Admit: 2019-01-11 | Discharge: 2019-01-11 | Disposition: A | Discharge status: Home / Self Care | Payer: Medicare Other | Attending: Cardiology | Admitting: Cardiology

## 2019-01-11 ENCOUNTER — Other Ambulatory Visit: Payer: Self-pay

## 2019-01-11 ENCOUNTER — Encounter (HOSPITAL_COMMUNITY)
Admission: RE | Disposition: A | Discharge status: Home / Self Care | Payer: Self-pay | Source: Home / Self Care | Attending: Cardiology

## 2019-01-11 ENCOUNTER — Encounter (HOSPITAL_COMMUNITY): Payer: Self-pay | Admitting: *Deleted

## 2019-01-11 DIAGNOSIS — I4819 Other persistent atrial fibrillation: Secondary | ICD-10-CM | POA: Diagnosis not present

## 2019-01-11 DIAGNOSIS — I422 Other hypertrophic cardiomyopathy: Secondary | ICD-10-CM | POA: Insufficient documentation

## 2019-01-11 DIAGNOSIS — Z7901 Long term (current) use of anticoagulants: Secondary | ICD-10-CM | POA: Insufficient documentation

## 2019-01-11 DIAGNOSIS — I2510 Atherosclerotic heart disease of native coronary artery without angina pectoris: Secondary | ICD-10-CM | POA: Insufficient documentation

## 2019-01-11 DIAGNOSIS — Z888 Allergy status to other drugs, medicaments and biological substances status: Secondary | ICD-10-CM | POA: Diagnosis not present

## 2019-01-11 DIAGNOSIS — Z79899 Other long term (current) drug therapy: Secondary | ICD-10-CM | POA: Insufficient documentation

## 2019-01-11 DIAGNOSIS — I480 Paroxysmal atrial fibrillation: Secondary | ICD-10-CM | POA: Diagnosis not present

## 2019-01-11 DIAGNOSIS — I4892 Unspecified atrial flutter: Secondary | ICD-10-CM | POA: Diagnosis not present

## 2019-01-11 DIAGNOSIS — I1310 Hypertensive heart and chronic kidney disease without heart failure, with stage 1 through stage 4 chronic kidney disease, or unspecified chronic kidney disease: Secondary | ICD-10-CM | POA: Insufficient documentation

## 2019-01-11 DIAGNOSIS — E039 Hypothyroidism, unspecified: Secondary | ICD-10-CM | POA: Insufficient documentation

## 2019-01-11 DIAGNOSIS — K219 Gastro-esophageal reflux disease without esophagitis: Secondary | ICD-10-CM | POA: Diagnosis not present

## 2019-01-11 DIAGNOSIS — E785 Hyperlipidemia, unspecified: Secondary | ICD-10-CM | POA: Diagnosis not present

## 2019-01-11 DIAGNOSIS — G4733 Obstructive sleep apnea (adult) (pediatric): Secondary | ICD-10-CM | POA: Diagnosis not present

## 2019-01-11 DIAGNOSIS — J449 Chronic obstructive pulmonary disease, unspecified: Secondary | ICD-10-CM | POA: Insufficient documentation

## 2019-01-11 DIAGNOSIS — Z8249 Family history of ischemic heart disease and other diseases of the circulatory system: Secondary | ICD-10-CM | POA: Diagnosis not present

## 2019-01-11 DIAGNOSIS — Z87891 Personal history of nicotine dependence: Secondary | ICD-10-CM | POA: Insufficient documentation

## 2019-01-11 DIAGNOSIS — N183 Chronic kidney disease, stage 3 unspecified: Secondary | ICD-10-CM | POA: Insufficient documentation

## 2019-01-11 DIAGNOSIS — I5032 Chronic diastolic (congestive) heart failure: Secondary | ICD-10-CM | POA: Insufficient documentation

## 2019-01-11 DIAGNOSIS — I130 Hypertensive heart and chronic kidney disease with heart failure and stage 1 through stage 4 chronic kidney disease, or unspecified chronic kidney disease: Secondary | ICD-10-CM | POA: Diagnosis not present

## 2019-01-11 HISTORY — PX: CARDIOVERSION: SHX1299

## 2019-01-11 LAB — POCT I-STAT, CHEM 8
BUN: 24 mg/dL — ABNORMAL HIGH (ref 8–23)
Calcium, Ion: 1.16 mmol/L (ref 1.15–1.40)
Chloride: 108 mmol/L (ref 98–111)
Creatinine, Ser: 1.4 mg/dL — ABNORMAL HIGH (ref 0.44–1.00)
Glucose, Bld: 78 mg/dL (ref 70–99)
HCT: 40 % (ref 36.0–46.0)
Hemoglobin: 13.6 g/dL (ref 12.0–15.0)
Potassium: 4.5 mmol/L (ref 3.5–5.1)
Sodium: 140 mmol/L (ref 135–145)
TCO2: 20 mmol/L — ABNORMAL LOW (ref 22–32)

## 2019-01-11 SURGERY — CARDIOVERSION
Anesthesia: General

## 2019-01-11 MED ORDER — EPHEDRINE SULFATE-NACL 50-0.9 MG/10ML-% IV SOSY
PREFILLED_SYRINGE | INTRAVENOUS | Status: DC | PRN
  Administered 2019-01-11: 10 mg via INTRAVENOUS

## 2019-01-11 MED ORDER — PHENYLEPHRINE 40 MCG/ML (10ML) SYRINGE FOR IV PUSH (FOR BLOOD PRESSURE SUPPORT)
PREFILLED_SYRINGE | INTRAVENOUS | Status: DC | PRN
  Administered 2019-01-11 (×2): 80 ug via INTRAVENOUS

## 2019-01-11 MED ORDER — LIDOCAINE 2% (20 MG/ML) 5 ML SYRINGE
INTRAMUSCULAR | Status: DC | PRN
  Administered 2019-01-11: 100 mg via INTRAVENOUS

## 2019-01-11 MED ORDER — PROPOFOL 10 MG/ML IV BOLUS
INTRAVENOUS | Status: DC | PRN
  Administered 2019-01-11: 80 mg via INTRAVENOUS

## 2019-01-11 MED ORDER — SODIUM CHLORIDE 0.9 % IV SOLN
INTRAVENOUS | Status: DC
  Administered 2019-01-11: 13:00:00 500 mL via INTRAVENOUS

## 2019-01-11 NOTE — Anesthesia Postprocedure Evaluation (Signed)
Anesthesia Post Note  Patient: Amy [name]  Procedure(s) Performed: CARDIOVERSION (N/A )     Patient location during evaluation: PACU Anesthesia Type: General Level of consciousness: awake and alert Pain management: pain level controlled Vital Signs Assessment: post-procedure vital signs reviewed and stable Respiratory status: spontaneous breathing, nonlabored ventilation, respiratory function stable and patient connected to nasal cannula oxygen Cardiovascular status: blood pressure returned to baseline and stable Postop Assessment: no apparent nausea or vomiting Anesthetic complications: no    Last Vitals:  Vitals:   01/11/19 1435 01/11/19 1440  BP: 98/67   Pulse: (!) 57   Resp: 16   Temp:    SpO2: 100% 100%    Last Pain:  Vitals:   01/11/19 1435  TempSrc:   PainSc: 0-No pain                 [name] DAVID

## 2019-01-11 NOTE — Discharge Instructions (Signed)

## 2019-01-11 NOTE — Anesthesia Preprocedure Evaluation (Signed)
Anesthesia Evaluation  Patient identified by MRN, date of birth, ID band Patient awake    Reviewed: Allergy & Precautions, NPO status , Patient's Chart, lab work & pertinent test results  Airway Mallampati: I  TM Distance: >3 FB Neck ROM: Full    Dental   Pulmonary asthma , COPD, former smoker,    Pulmonary exam normal        Cardiovascular hypertension, Pt. on medications + CAD  Normal cardiovascular exam     Neuro/Psych    GI/Hepatic GERD  Medicated and Controlled,  Endo/Other    Renal/GU Renal InsufficiencyRenal disease     Musculoskeletal   Abdominal   Peds  Hematology   Anesthesia Other Findings   Reproductive/Obstetrics                             Anesthesia Physical Anesthesia Plan  ASA: III  Anesthesia Plan: General   Post-op Pain Management:    Induction: Intravenous  PONV Risk Score and Plan: Treatment may vary due to age or medical condition  Airway Management Planned: Mask  Additional Equipment:   Intra-op Plan:   Post-operative Plan:   Informed Consent: I have reviewed the patients History and Physical, chart, labs and discussed the procedure including the risks, benefits and alternatives for the proposed anesthesia with the patient or authorized representative who has indicated his/her understanding and acceptance.       Plan Discussed with: CRNA and Surgeon  Anesthesia Plan Comments:         Anesthesia Quick Evaluation

## 2019-01-11 NOTE — Procedures (Signed)
Electrical Cardioversion Procedure Note Amy [name] [id number] Feb 20, 1946  Procedure: Electrical Cardioversion Indications:  Atrial Flutter  Procedure Details Consent: Risks of procedure as well as the alternatives and risks of each were explained to the (patient/caregiver).  Consent for procedure obtained. Time Out: Verified patient identification, verified procedure, site/side was marked, verified correct patient position, special equipment/implants available, medications/allergies/relevent history reviewed, required imaging and test results available.  Performed  Patient placed on cardiac monitor, pulse oximetry, supplemental oxygen as necessary.  Sedation given: Propofol per anesthesiology Pacer pads placed anterior and posterior chest.  Cardioverted 2 time(s).  Cardioverted at [place].  Evaluation Findings: Post procedure EKG shows: NSR Complications: None Patient did tolerate procedure well.   [name] 01/11/2019, 2:14 PM

## 2019-01-11 NOTE — Transfer of Care (Signed)
Immediate Anesthesia Transfer of Care Note  Patient: Amy [name]  Procedure(s) Performed: CARDIOVERSION (N/A )  Patient Location: PACU and Endoscopy Unit  Anesthesia Type:General  Level of Consciousness: awake, patient cooperative and responds to stimulation  Airway & Oxygen Therapy: Patient Spontanous Breathing and Patient connected to nasal cannula oxygen  Post-op Assessment: Report given to RN and Post -op Vital signs reviewed and stable  Post vital signs: Reviewed and stable  Last Vitals:  Vitals Value Taken Time  BP    Temp    Pulse 76 01/11/19 1412  Resp 25 01/11/19 1412  SpO2 93 % 01/11/19 1412  Vitals shown include unvalidated device data.  Last Pain:  Vitals:   01/11/19 1320  TempSrc: Oral  PainSc: 0-No pain         Complications: No apparent anesthesia complications

## 2019-01-11 NOTE — Interval H&P Note (Signed)
History and Physical Interval Note:  01/11/2019 1:53 PM  Purdin  has presented today for surgery, with the diagnosis of AFIB.  The various methods of treatment have been discussed with the patient and family. After consideration of risks, benefits and other options for treatment, the patient has consented to  Procedure(s): CARDIOVERSION (N/A) as a surgical intervention.  The patient's history has been reviewed, patient examined, no change in status, stable for surgery.  I have reviewed the patient's chart and labs.  Questions were answered to the patient's satisfaction.     Earon Rivest [employer]

## 2019-01-12 ENCOUNTER — Encounter (HOSPITAL_COMMUNITY): Payer: Self-pay | Admitting: Cardiology

## 2019-01-18 ENCOUNTER — Other Ambulatory Visit: Payer: Self-pay

## 2019-01-18 ENCOUNTER — Encounter (HOSPITAL_COMMUNITY): Payer: Self-pay | Admitting: Nurse Practitioner

## 2019-01-18 ENCOUNTER — Ambulatory Visit (HOSPITAL_COMMUNITY)
Admission: RE | Admit: 2019-01-18 | Discharge: 2019-01-18 | Disposition: A | Discharge status: Home / Self Care | Payer: Medicare Other | Source: Ambulatory Visit | Attending: Nurse Practitioner | Admitting: Nurse Practitioner

## 2019-01-18 VITALS — BP 90/70 | HR 127 | Ht 63.0 in | Wt 189.2 lb

## 2019-01-18 DIAGNOSIS — E785 Hyperlipidemia, unspecified: Secondary | ICD-10-CM | POA: Diagnosis not present

## 2019-01-18 DIAGNOSIS — I480 Paroxysmal atrial fibrillation: Secondary | ICD-10-CM | POA: Diagnosis not present

## 2019-01-18 DIAGNOSIS — E039 Hypothyroidism, unspecified: Secondary | ICD-10-CM | POA: Diagnosis not present

## 2019-01-18 DIAGNOSIS — I4819 Other persistent atrial fibrillation: Secondary | ICD-10-CM | POA: Diagnosis not present

## 2019-01-18 DIAGNOSIS — Z87891 Personal history of nicotine dependence: Secondary | ICD-10-CM | POA: Diagnosis not present

## 2019-01-18 DIAGNOSIS — N183 Chronic kidney disease, stage 3 unspecified: Secondary | ICD-10-CM | POA: Insufficient documentation

## 2019-01-18 DIAGNOSIS — J449 Chronic obstructive pulmonary disease, unspecified: Secondary | ICD-10-CM | POA: Diagnosis not present

## 2019-01-18 DIAGNOSIS — K219 Gastro-esophageal reflux disease without esophagitis: Secondary | ICD-10-CM | POA: Diagnosis not present

## 2019-01-18 DIAGNOSIS — Z79899 Other long term (current) drug therapy: Secondary | ICD-10-CM | POA: Diagnosis not present

## 2019-01-18 DIAGNOSIS — Z7901 Long term (current) use of anticoagulants: Secondary | ICD-10-CM | POA: Diagnosis not present

## 2019-01-18 DIAGNOSIS — I5032 Chronic diastolic (congestive) heart failure: Secondary | ICD-10-CM | POA: Insufficient documentation

## 2019-01-18 DIAGNOSIS — I130 Hypertensive heart and chronic kidney disease with heart failure and stage 1 through stage 4 chronic kidney disease, or unspecified chronic kidney disease: Secondary | ICD-10-CM | POA: Diagnosis not present

## 2019-01-18 NOTE — Progress Notes (Addendum)
Primary Care Physician: Aretta Nip, MD Primary Cardiologist: Aundra Dubin Primary Electrophysiologist: Klein/Allred   Amy [name] is a 73 y.o. female with a history of COPD, OSA on CPAP, apical hypertrophic cardiomyopathy, pulmonary hypertension, chronic diastolic CHF, and paroxysmal atrial fibrillation who presents for follow up form PCP being seen one week ago for  breakthrough a fib with RVR and intermittent soft BP. She underwent AF ablation by Dr Rayann Heman in 2017 and had done well until recently being maintained on multaq. She has had  2 cardioversion's this year, last one in June. Over the last  week, pt has taken  metoprolol tartrate 1-3 doses a day as needed but is not taking consistently on a daily basis. The one day that she did take 3x, she felt more comfortable as her HR was running less thatn 100. Her BP held with this dose.  On last visit with Dr. Rayann Heman,  5/29,  verbatium from his note...  " severe LA enlargement with extensive atriopathy and multiple atypical atrial flutter circuits, not amenable to ablation 2017.  Conservative management is advised. Continue multaq at this time. I would not advise further ablation for this patient.  She would be very high risk given low BP and chronic O2/ lung disease and success rates would be low. She really does not have additional AAD options.  Ultimately, rate control may be our only sustainable option."  I discussed pt with Dr. Rayann Heman and Dr. Aundra Dubin on last visit.  Dr. Aundra Dubin wanted   to try one more cardioversion. If this did  not hold, then Dr. Rayann Heman suggested stopping Multaq, adding daily metoprolol and adding digoxin. PharmD suggest 0.125 mg daily with trough level in one week after start of drug, if this come to fruition.   F/u in afib clinic, 10/13. Unfortunately, the successful cardioversion only held 6 days.  She noted increase in HR yesterday. Her BP's are soft on daily BB and it is not touching her HR. Ekg shows atrial  flutter at 127 bpm.   Today denies symptoms of chest pain, orthopnea, PND, presyncope, syncope, snoring, daytime somnolence, bleeding, or neurologic sequela. The patient is tolerating medications without difficulties and is otherwise without complaint today.    Past Medical History:  Diagnosis Date  . 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 Apical variant hypertrophic cardiomyopathy ([place])    Diagnosed by echo and ECG 11/13  . Atypical atrial flutter ([place])   . CAD (coronary artery disease)    a. LHC in 10/2011 demonstrated non-obstructive disease and an 80% lesion in a small D1 treated medically.   . CHF (congestive heart failure) ([place])   . Chronic diastolic heart failure ([place])   . CKD (chronic kidney disease), stage III   . COPD (chronic obstructive pulmonary disease) ([place])   . Dizziness    had PT for being off balance - in approx. 2012  . GERD (gastroesophageal reflux disease)   . Hyperlipidemia   . Hypertension   . Hypothyroid   . Lumbar disc disease   . Macular degeneration   . OSA (obstructive sleep apnea)   . Persistent atrial fibrillation ([place])       . Pulmonary hypertension ([place])    a. [place] 05/2013 showed mild PAH with normal PCWP and RA pressure, could be related to OSA and low oxygen saturation.   . Refusal of blood transfusions as patient is Jehovah's Witness   . Sinus bradycardia    Past Surgical History:  Procedure Laterality Date  .  BIOPSY  12/03/2017   Procedure: BIOPSY;  Surgeon: Otis Brace, MD;  Location: [facility] ENDOSCOPY;  Service: Gastroenterology;;  . CARDIAC CATHETERIZATION     normal coronary arteries  . CARDIAC CATHETERIZATION N/A 10/31/2014   Procedure: Left Heart Cath and Coronary Angiography;  Surgeon: Leonie Man, MD;  Location: [place] [facility] LAB;  Service: Cardiovascular;  Laterality: N/A;  . CARDIOVERSION N/A 01/11/2019   Procedure: CARDIOVERSION;  Surgeon: Larey Dresser, MD;  Location[facility] ENDOSCOPY;  Service: Cardiovascular;  Laterality: N/A;  .  CATARACT EXTRACTION Bilateral   . [place]  . COLONOSCOPY WITH PROPOFOL N/A 12/03/2017   Procedure: COLONOSCOPY WITH PROPOFOL;  Surgeon: Otis Brace, MD;  Location: [place];  Service: Gastroenterology;  Laterality: N/A;  . ELECTROPHYSIOLOGIC STUDY N/A 04/24/2015   Procedure: Atrial Fibrillation Ablation;  Surgeon: [name], MD;  Location: [place] CV LAB;  Service: Cardiovascular;  Laterality: N/A;  . FLEXIBLE SIGMOIDOSCOPY N/A 11/30/2017   Procedure: FLEXIBLE SIGMOIDOSCOPY;  Surgeon: Otis Brace, MD;  Location: [place];  Service: Gastroenterology;  Laterality: N/A;  . RIGHT HEART CATH N/A 06/25/2016   Procedure: Right Heart Cath;  Surgeon: Sherren Mocha, MD;  Location: [place] CV LAB;  Service: Cardiovascular;  Laterality: N/A;  . RIGHT HEART CATH N/A 06/10/2017   Procedure: RIGHT HEART CATH;  Surgeon: Larey Dresser, MD;  Location: [place] [facility] LAB;  Service: Cardiovascular;  Laterality: N/A;  . RIGHT HEART CATHETERIZATION N/A 06/01/2013   Procedure: RIGHT HEART CATH;  Surgeon: Larey Dresser, MD;  Location[facility];  Service: Cardiovascular;  Laterality: N/A;  . TEE WITHOUT CARDIOVERSION N/A 04/23/2015   Procedure: TRANSESOPHAGEAL ECHOCARDIOGRAM (TEE);  Surgeon: Josue Hector, MD;  Location: [facility] ENDOSCOPY;  Service: Cardiovascular;  Laterality: N/A;    Current Outpatient Medications  Medication Sig Dispense Refill  . acetaminophen (TYLENOL) 325 MG tablet Take 2 tablets (650 mg total) by mouth every 4 (four) hours as needed for headache or mild pain.    Marland Kitchen apixaban (ELIQUIS) 5 MG TABS tablet Take 1 tablet (5 mg total) by mouth 2 (two) times daily. 60 tablet 3  . atorvastatin (LIPITOR) 80 MG tablet Take 1 tablet (80 mg total) by mouth daily with lunch. 90 tablet 3  . azaTHIOprine (IMURAN) 50 MG tablet Take 100 mg by mouth at bedtime.     . Cholecalciferol (VITAMIN D) 2000 UNITS CAPS Take 2,000 Units by mouth at bedtime.     Marland Kitchen COLCRYS 0.6 MG  tablet TAKE 1 TABLET BY MOUTH DAILY (Patient taking differently: Take 0.6 mg by mouth daily. ) 30 tablet 0  . ezetimibe (ZETIA) 10 MG tablet Take 10 mg by mouth daily.    . folic acid (FOLVITE) 1 MG tablet Take 1 mg by mouth daily.    . furosemide (LASIX) 20 MG tablet Take 20 mg by mouth daily at 12 noon.     Marland Kitchen levothyroxine (SYNTHROID) 100 MCG tablet daily.     . macitentan (OPSUMIT) 10 MG tablet Take 1 tablet (10 mg total) by mouth daily. 30 tablet 11  . metoprolol succinate (TOPROL XL) 25 MG 24 hr tablet Take 1 tablet (25 mg total) by mouth daily with supper. 30 tablet 6  . MULTAQ 400 MG tablet TAKE 1 TABLET BY MOUTH TWICE A DAY WITH MEALS (Patient taking differently: Take 400 mg by mouth 2 (two) times daily. ) 180 tablet 1  . Multiple Vitamin (MULTIVITAMIN) capsule Take 1 capsule by mouth daily.     Marland Kitchen  mupirocin nasal ointment (BACTROBAN) 2 % Place 1 application into the nose daily as needed (nose sores).    . neomycin-bacitracin-polymyxin (NEOSPORIN) ointment Apply 1 application topically as needed for wound care.    . ondansetron (ZOFRAN-ODT) 4 MG disintegrating tablet Take 1 tablet (4 mg total) by mouth every 8 (eight) hours as needed for nausea or vomiting. (Patient taking differently: Take 4 mg by mouth as needed for nausea or vomiting. ) 20 tablet 0  . OVER THE COUNTER MEDICATION Take 1 tablet by mouth 2 (two) times daily as needed (cold symptoms). Counter-act otc cold med    . pantoprazole (PROTONIX) 40 MG tablet TAKE 1 TABLET BY MOUTH EVERY DAY (Patient taking differently: Take 40 mg by mouth daily. ) 90 tablet 2  . potassium chloride SA (K-DUR) 20 MEQ tablet Take 10 mEq by mouth daily at 12 noon.     Marland Kitchen UNABLE TO FIND Med Name: CPAP  [facility]    . UPTRAVI 1600 MCG TABS TAKE 1 TABLET TWO TIMES A DAY 60 tablet 1  . vitamin B-12 (CYANOCOBALAMIN) 1000 MCG tablet Take 1,000 mcg by mouth every evening.    . colestipol (COLESTID) 1 g tablet Take 2 g by mouth at bedtime.     No current  facility-administered medications for this encounter.     Allergies  Allergen Reactions  . Amiodarone     Dyspnea - felt to be 2/2 amio lung toxicity Has tolerated Omnipaque   . Cellcept [Mycophenolate Mofetil] Other (See Comments)    Gastric Bleeding  . Zyban [Bupropion] Itching    Social History   Socioeconomic History  . Marital status: Widowed    Spouse name: Not on file  . Number of children: 2  . Years of education: Not on file  . Highest education level: Not on file  Occupational History  . Occupation: retired. still works for [employer].   Social Needs  . Financial resource strain: Not hard at all  . Food insecurity    Worry: Never true    Inability: Never true  . Transportation needs    Medical: No    Non-medical: No  Tobacco Use  . Smoking status: Former Smoker    Packs/day: 0.50    Years: 44.00    Pack years: 22.00    Types: Cigarettes    Quit date: 04/07/2009    Years since quitting: 9.7  . Smokeless tobacco: Never Used  Substance and Sexual Activity  . Alcohol use: No  . Drug use: No  . Sexual activity: Never  Lifestyle  . Physical activity    Days per week: Not on file    Minutes per session: Not on file  . Stress: Not on file  Relationships  . Social Herbalist on phone: Not on file    Gets together: Not on file    Attends religious service: Not on file    Active member of club or organization: Not on file    Attends meetings of clubs or organizations: Not on file    Relationship status: Not on file  . Intimate partner violence    Fear of current or ex partner: Not on file    Emotionally abused: Not on file    Physically abused: Not on file    Forced sexual activity: Not on file  Other Topics Concern  . Not on file  Social History Narrative   Pt lives alone with 2 dogs.     Family History  Problem Relation Age of Onset  . Heart disease Sister        Good Pastures Syndrome  . Gout Father   . Lung cancer Father        smoked   . Breast cancer Paternal Aunt    The patient does not have a history of early familial atrial fibrillation or other arrhythmias.  ROS- All systems are reviewed and negative except as per the HPI above.  Physical Exam: Vitals:   01/18/19 1129  BP: 90/70  Pulse: (!) 127  Weight: 85.8 kg  Height: _0  (1.6 m)    GEN- The patient is chronically ill appearing, alert and oriented x 3 today. O2 via [place]  Head- normocephalic, atraumatic Eyes-  Sclera clear, conjunctiva pink Ears- hearing intact Oropharynx- clear Neck- supple  Lungs- Clear to ausculation bilaterally, normal work of breathing Heart- Rapid irregular rate and rhythm  GI- soft, NT, ND, + BS Extremities- no clubbing, cyanosis, or edema MS- no significant deformity or atrophy Skin- no rash or lesion Psych- euthymic mood, full affect Neuro- strength and sensation are intact  Wt Readings from Last 3 Encounters:  01/18/19 85.8 kg  01/07/19 85.5 kg  10/14/18 83 kg    EKG today demonstrates  Aflutter at 129 bpm, 2:1, qrs int 70 ms, qtc 503 ms  Epic records are reviewed at length today  Assessment and Plan:  1. Paroxsymal  atrial fibrillation/flutter  The patient has symptomatic atrial fibrillation with RVR. She is status post AF ablation in 2017. She had been doing well without recurrence until the last few months. She also has chronic lung issues which contributes to her not tolerating afib well. Unfortunately, her options are limited, Multaq is her only antiarrythmic choice as she has had long qtc when tried on tikosyn,  in the [place] area , amiodarone is contraindicated 2/2 with her  lung disease The consensus with Dr. Gertha Calkin. Rayann Heman  on last visit is that she was scheduleded for cardioversion 10/6 with Dr. Aundra Dubin  Successful but with ERAF  Continue Multaq 400 mg bid  She was started on toprol xl  25 mg daily but can not go up on dose for soft BP's Digoxin was mentioned by Dr. Rayann Heman on last visit but I do not  think it will be enough to slow HR down enough so she can tolerate arrhythmia with lung status   Therefore, will refer to Dr. Lovena Le ASAP  to discuss PPM with AV nodal ablation   Butch Penny C. Lumir Demetriou, [facility] [address] [place],  [address] [phone]

## 2019-01-25 ENCOUNTER — Ambulatory Visit (INDEPENDENT_AMBULATORY_CARE_PROVIDER_SITE_OTHER): Payer: Medicare Other | Admitting: Internal Medicine

## 2019-01-25 ENCOUNTER — Other Ambulatory Visit: Payer: Self-pay

## 2019-01-25 ENCOUNTER — Encounter: Payer: Self-pay | Admitting: Internal Medicine

## 2019-01-25 VITALS — BP 88/56 | HR 132 | Ht 63.0 in | Wt 204.0 lb

## 2019-01-25 DIAGNOSIS — I4811 Longstanding persistent atrial fibrillation: Secondary | ICD-10-CM | POA: Diagnosis not present

## 2019-01-25 DIAGNOSIS — I480 Paroxysmal atrial fibrillation: Secondary | ICD-10-CM | POA: Diagnosis not present

## 2019-01-25 LAB — CBC WITH DIFFERENTIAL/PLATELET
Basophils Absolute: 0.1 10*3/uL (ref 0.0–0.2)
Basos: 1 %
EOS (ABSOLUTE): 0.1 10*3/uL (ref 0.0–0.4)
Eos: 2 %
Hematocrit: 34.4 % (ref 34.0–46.6)
Hemoglobin: 11.3 g/dL (ref 11.1–15.9)
Immature Grans (Abs): 0 10*3/uL (ref 0.0–0.1)
Immature Granulocytes: 1 %
Lymphocytes Absolute: 0.6 10*3/uL — ABNORMAL LOW (ref 0.7–3.1)
Lymphs: 7 %
MCH: 28.9 pg (ref 26.6–33.0)
MCHC: 32.8 g/dL (ref 31.5–35.7)
MCV: 88 fL (ref 79–97)
Monocytes Absolute: 0.8 10*3/uL (ref 0.1–0.9)
Monocytes: 10 %
Neutrophils Absolute: 6.4 10*3/uL (ref 1.4–7.0)
Neutrophils: 79 %
Platelets: 303 10*3/uL (ref 150–450)
RBC: 3.91 x10E6/uL (ref 3.77–5.28)
RDW: 14.8 % (ref 11.7–15.4)
WBC: 8.1 10*3/uL (ref 3.4–10.8)

## 2019-01-25 LAB — BASIC METABOLIC PANEL
BUN/Creatinine Ratio: 14 (ref 12–28)
BUN: 22 mg/dL (ref 8–27)
CO2: 18 mmol/L — ABNORMAL LOW (ref 20–29)
Calcium: 8.9 mg/dL (ref 8.7–10.3)
Chloride: 105 mmol/L (ref 96–106)
Creatinine, Ser: 1.55 mg/dL — ABNORMAL HIGH (ref 0.57–1.00)
GFR calc Af Amer: 38 mL/min/{1.73_m2} — ABNORMAL LOW (ref >59)
GFR calc non Af Amer: 33 mL/min/{1.73_m2} — ABNORMAL LOW (ref >59)
Glucose: 78 mg/dL (ref 65–99)
Potassium: 4.8 mmol/L (ref 3.5–5.2)
Sodium: 138 mmol/L (ref 134–144)

## 2019-01-25 NOTE — H&P (View-Only) (Signed)
HPI Mrs. Whren-[name] is referred today for consideration for AV node ablation and PPM insertion. She has a h/o atrial fib and underwent ablation approx 3 years ago. She has been unable to maintain NSR. She has both recurrent atrial fib and LA flutter. Her rates have been very difficult to control. Her pressures have been soft. She has not had syncope.  Allergies  Allergen Reactions  . Amiodarone     Dyspnea - felt to be 2/2 amio lung toxicity Has tolerated Omnipaque   . Cellcept [Mycophenolate Mofetil] Other (See Comments)    Gastric Bleeding  . Zyban [Bupropion] Itching     Current Outpatient Medications  Medication Sig Dispense Refill  . acetaminophen (TYLENOL) 325 MG tablet Take 2 tablets (650 mg total) by mouth every 4 (four) hours as needed for headache or mild pain.    Marland Kitchen apixaban (ELIQUIS) 5 MG TABS tablet Take 1 tablet (5 mg total) by mouth 2 (two) times daily. 60 tablet 3  . atorvastatin (LIPITOR) 80 MG tablet Take 1 tablet (80 mg total) by mouth daily with lunch. 90 tablet 3  . azaTHIOprine (IMURAN) 50 MG tablet Take 100 mg by mouth at bedtime.     . Cholecalciferol (VITAMIN D) 2000 UNITS CAPS Take 2,000 Units by mouth at bedtime.     Marland Kitchen COLCRYS 0.6 MG tablet TAKE 1 TABLET BY MOUTH DAILY (Patient taking differently: Take 0.6 mg by mouth daily. ) 30 tablet 0  . colestipol (COLESTID) 1 g tablet Take 2 g by mouth at bedtime.    Marland Kitchen ezetimibe (ZETIA) 10 MG tablet Take 10 mg by mouth daily.    . folic acid (FOLVITE) 1 MG tablet Take 1 mg by mouth daily.    . furosemide (LASIX) 20 MG tablet Take 20 mg by mouth daily at 12 noon.     Marland Kitchen levothyroxine (SYNTHROID) 100 MCG tablet daily.     . macitentan (OPSUMIT) 10 MG tablet Take 1 tablet (10 mg total) by mouth daily. 30 tablet 11  . metoprolol succinate (TOPROL XL) 25 MG 24 hr tablet Take 1 tablet (25 mg total) by mouth daily with supper. 30 tablet 6  . MULTAQ 400 MG tablet TAKE 1 TABLET BY MOUTH TWICE A DAY WITH MEALS (Patient  taking differently: Take 400 mg by mouth 2 (two) times daily. ) 180 tablet 1  . Multiple Vitamin (MULTIVITAMIN) capsule Take 1 capsule by mouth daily.     . mupirocin nasal ointment (BACTROBAN) 2 % Place 1 application into the nose daily as needed (nose sores).    . neomycin-bacitracin-polymyxin (NEOSPORIN) ointment Apply 1 application topically as needed for wound care.    . ondansetron (ZOFRAN-ODT) 4 MG disintegrating tablet Take 1 tablet (4 mg total) by mouth every 8 (eight) hours as needed for nausea or vomiting. (Patient taking differently: Take 4 mg by mouth as needed for nausea or vomiting. ) 20 tablet 0  . OVER THE COUNTER MEDICATION Take 1 tablet by mouth 2 (two) times daily as needed (cold symptoms). Counter-act otc cold med    . pantoprazole (PROTONIX) 40 MG tablet TAKE 1 TABLET BY MOUTH EVERY DAY (Patient taking differently: Take 40 mg by mouth daily. ) 90 tablet 2  . potassium chloride SA (K-DUR) 20 MEQ tablet Take 10 mEq by mouth daily at 12 noon.     Marland Kitchen UNABLE TO FIND Med Name: CPAP  [facility]    . UPTRAVI 1600 MCG TABS TAKE 1 TABLET TWO TIMES A  DAY 60 tablet 1  . vitamin B-12 (CYANOCOBALAMIN) 1000 MCG tablet Take 1,000 mcg by mouth every evening.     No current facility-administered medications for this visit.      Past Medical History:  Diagnosis Date  . Antisynthetase syndrome (HCC)   . Apical variant hypertrophic cardiomyopathy (HCC)    Diagnosed by echo and ECG 11/13  . Atypical atrial flutter (HCC)   . CAD (coronary artery disease)    a. LHC in 10/2011 demonstrated non-obstructive disease and an 80% lesion in a small D1 treated medically.   . CHF (congestive heart failure) (HCC)   . Chronic diastolic heart failure (HCC)   . CKD (chronic kidney disease), stage III   . COPD (chronic obstructive pulmonary disease) (HCC)   . Dizziness    had PT for being off balance - in approx. 2012  . GERD (gastroesophageal reflux disease)   . Hyperlipidemia   . Hypertension   .  Hypothyroid   . Lumbar disc disease   . Macular degeneration   . OSA (obstructive sleep apnea)   . Persistent atrial fibrillation (HCC)       . Pulmonary hypertension (HCC)    a. [facility] 05/2013 showed mild PAH with normal PCWP and RA pressure, could be related to OSA and low oxygen saturation.   . Refusal of blood transfusions as patient is Jehovah's Witness   . Sinus bradycardia     ROS:   All systems reviewed and negative except as noted in the HPI.   Past Surgical History:  Procedure Laterality Date  . BIOPSY  12/03/2017   Procedure: BIOPSY;  Surgeon: Brahmbhatt, Parag, MD;  [facility]: [facility];  Service: Gastroenterology;;  . CARDIAC CATHETERIZATION     normal coronary arteries  . CARDIAC CATHETERIZATION N/A 10/31/2014   Procedure: Left Heart Cath and Coronary Angiography;  Surgeon: David W Harding, MD;  [facility];  Service: Cardiovascular;  Laterality: N/A;  . CARDIOVERSION N/A 01/11/2019   Procedure: CARDIOVERSION;  Surgeon: McLean, Dalton S, MD;  Location: [facility];  Service: Cardiovascular;  Laterality: N/A;  . CATARACT EXTRACTION Bilateral   . CESAREAN SECTION  1983  . COLONOSCOPY WITH PROPOFOL N/A 12/03/2017   Procedure: COLONOSCOPY WITH PROPOFOL;  Surgeon: Brahmbhatt, Parag, MD;  [facility]: [facility];  Service: Gastroenterology;  Laterality: N/A;  . ELECTROPHYSIOLOGIC STUDY N/A 04/24/2015   Procedure: Atrial Fibrillation Ablation;  Surgeon: James Allred, MD;  [facility];  Service: Cardiovascular;  Laterality: N/A;  . FLEXIBLE SIGMOIDOSCOPY N/A 11/30/2017   Procedure: FLEXIBLE SIGMOIDOSCOPY;  Surgeon: Brahmbhatt, Parag, MD;  [facility];  Service: Gastroenterology;  Laterality: N/A;  . RIGHT HEART CATH N/A 06/25/2016   Procedure: Right Heart Cath;  Surgeon: Michael Cooper, MD;  [facility];  Service: Cardiovascular;  Laterality: N/A;  . RIGHT HEART CATH N/A 06/10/2017   Procedure: RIGHT HEART CATH;  Surgeon:  McLean, Dalton S, MD;  [facility];  Service: Cardiovascular;  Laterality: N/A;  . RIGHT HEART CATHETERIZATION N/A 06/01/2013   Procedure: RIGHT HEART CATH;  Surgeon: Dalton S McLean, MD;  [facility];  Service: Cardiovascular;  Laterality: N/A;  . TEE WITHOUT CARDIOVERSION N/A 04/23/2015   Procedure: TRANSESOPHAGEAL ECHOCARDIOGRAM (TEE);  Surgeon: Peter C Nishan, MD;  [facility]: [facility];  Service: Cardiovascular;  Laterality: N/A;     Family History  Problem Relation Age of Onset  . Heart disease Sister        Good   Pastures Syndrome  . Gout Father   . Lung cancer Father        smoked  . Breast cancer Paternal Aunt      Social History   Socioeconomic History  . Marital status: Widowed    Spouse name: Not on file  . Number of children: 2  . Years of education: Not on file  . Highest education level: Not on file  Occupational History  . Occupation: retired. still works for [employer].   Social Needs  . Financial resource strain: Not hard at all  . Food insecurity    Worry: Never true    Inability: Never true  . Transportation needs    Medical: No    Non-medical: No  Tobacco Use  . Smoking status: Former Smoker    Packs/day: 0.50    Years: 44.00    Pack years: 22.00    Types: Cigarettes    Quit date: 04/07/2009    Years since quitting: 9.8  . Smokeless tobacco: Never Used  Substance and Sexual Activity  . Alcohol use: No  . Drug use: No  . Sexual activity: Never  Lifestyle  . Physical activity    Days per week: Not on file    Minutes per session: Not on file  . Stress: Not on file  Relationships  . Social Herbalist on phone: Not on file    Gets together: Not on file    Attends religious service: Not on file    Active member of club or organization: Not on file    Attends meetings of clubs or organizations: Not on file    Relationship status: Not on file  . Intimate partner violence    Fear of current or ex partner: Not on  file    Emotionally abused: Not on file    Physically abused: Not on file    Forced sexual activity: Not on file  Other Topics Concern  . Not on file  Social History Narrative   Pt lives alone with 2 dogs.      BP (!) 88/56   Pulse (!) 132   Ht _0  (1.6 m)   Wt 204 lb (92.5 kg)   SpO2 92%   BMI 36.14 kg/m   Physical Exam:  Well appearing NAD HEENT: Unremarkable Neck:  No JVD, no thyromegally Lymphatics:  No adenopathy Back:  No CVA tenderness Lungs:  Clear with no wheezes HEART:  Regular rate rhythm, no murmurs, no rubs, no clicks Abd:  soft, positive bowel sounds, no organomegally, no rebound, no guarding Ext:  2 plus pulses, no edema, no cyanosis, no clubbing Skin:  No rashes no nodules Neuro:  CN II through XII intact, motor grossly intact  EKG - atypical atrial flutter with a RVR  Assess/Plan: 1. Atypical atrial flutter and fib - the patient's VR has been poorly controlled. I have discussed the indications/risks/benefits/goals/expectations of AV node ablation and PM insertion. I would anticipate placing a RV to pace the left bundle. I have recommended she stop the multaq.  2. Coags - we will hold her systemic anti-coagulation prior to her procedure.  Mikle Bosworth.D.

## 2019-01-25 NOTE — Progress Notes (Signed)
HPI Amy [name] is referred today for consideration for AV node ablation and PPM insertion. She has a h/o atrial fib and underwent ablation approx 3 years ago. She has been unable to maintain NSR. She has both recurrent atrial fib and LA flutter. Her rates have been very difficult to control. Her pressures have been soft. She has not had syncope.  Allergies  Allergen Reactions  . Amiodarone     Dyspnea - felt to be 2/2 amio lung toxicity Has tolerated Omnipaque   . Cellcept [Mycophenolate Mofetil] Other (See Comments)    Gastric Bleeding  . Zyban [Bupropion] Itching     Current Outpatient Medications  Medication Sig Dispense Refill  . acetaminophen (TYLENOL) 325 MG tablet Take 2 tablets (650 mg total) by mouth every 4 (four) hours as needed for headache or mild pain.    Marland Kitchen apixaban (ELIQUIS) 5 MG TABS tablet Take 1 tablet (5 mg total) by mouth 2 (two) times daily. 60 tablet 3  . atorvastatin (LIPITOR) 80 MG tablet Take 1 tablet (80 mg total) by mouth daily with lunch. 90 tablet 3  . azaTHIOprine (IMURAN) 50 MG tablet Take 100 mg by mouth at bedtime.     . Cholecalciferol (VITAMIN D) 2000 UNITS CAPS Take 2,000 Units by mouth at bedtime.     Marland Kitchen COLCRYS 0.6 MG tablet TAKE 1 TABLET BY MOUTH DAILY (Patient taking differently: Take 0.6 mg by mouth daily. ) 30 tablet 0  . colestipol (COLESTID) 1 g tablet Take 2 g by mouth at bedtime.    Marland Kitchen ezetimibe (ZETIA) 10 MG tablet Take 10 mg by mouth daily.    . folic acid (FOLVITE) 1 MG tablet Take 1 mg by mouth daily.    . furosemide (LASIX) 20 MG tablet Take 20 mg by mouth daily at 12 noon.     Marland Kitchen levothyroxine (SYNTHROID) 100 MCG tablet daily.     . macitentan (OPSUMIT) 10 MG tablet Take 1 tablet (10 mg total) by mouth daily. 30 tablet 11  . metoprolol succinate (TOPROL XL) 25 MG 24 hr tablet Take 1 tablet (25 mg total) by mouth daily with supper. 30 tablet 6  . MULTAQ 400 MG tablet TAKE 1 TABLET BY MOUTH TWICE A DAY WITH MEALS (Patient  taking differently: Take 400 mg by mouth 2 (two) times daily. ) 180 tablet 1  . Multiple Vitamin (MULTIVITAMIN) capsule Take 1 capsule by mouth daily.     . mupirocin nasal ointment (BACTROBAN) 2 % Place 1 application into the nose daily as needed (nose sores).    . neomycin-bacitracin-polymyxin (NEOSPORIN) ointment Apply 1 application topically as needed for wound care.    . ondansetron (ZOFRAN-ODT) 4 MG disintegrating tablet Take 1 tablet (4 mg total) by mouth every 8 (eight) hours as needed for nausea or vomiting. (Patient taking differently: Take 4 mg by mouth as needed for nausea or vomiting. ) 20 tablet 0  . OVER THE COUNTER MEDICATION Take 1 tablet by mouth 2 (two) times daily as needed (cold symptoms). Counter-act otc cold med    . pantoprazole (PROTONIX) 40 MG tablet TAKE 1 TABLET BY MOUTH EVERY DAY (Patient taking differently: Take 40 mg by mouth daily. ) 90 tablet 2  . potassium chloride SA (K-DUR) 20 MEQ tablet Take 10 mEq by mouth daily at 12 noon.     Marland Kitchen UNABLE TO FIND Med Name: CPAP  [facility]    . UPTRAVI 1600 MCG TABS TAKE 1 TABLET TWO TIMES A  DAY 60 tablet 1  . vitamin B-12 (CYANOCOBALAMIN) 1000 MCG tablet Take 1,000 mcg by mouth every evening.     No current facility-administered medications for this visit.      Past Medical History:  Diagnosis Date  . Antisynthetase syndrome ([place])   . Apical variant hypertrophic cardiomyopathy ([place])    Diagnosed by echo and ECG 11/13  . Atypical atrial flutter ([place])   . CAD (coronary artery disease)    a. LHC in 10/2011 demonstrated non-obstructive disease and an 80% lesion in a small D1 treated medically.   . CHF (congestive heart failure) ([place])   . Chronic diastolic heart failure ([place])   . CKD (chronic kidney disease), stage III   . COPD (chronic obstructive pulmonary disease) ([place])   . Dizziness    had PT for being off balance - in approx. 2012  . GERD (gastroesophageal reflux disease)   . Hyperlipidemia   . Hypertension   .  Hypothyroid   . Lumbar disc disease   . Macular degeneration   . OSA (obstructive sleep apnea)   . Persistent atrial fibrillation ([place])       . Pulmonary hypertension ([place])    a. [place] 05/2013 showed mild PAH with normal PCWP and RA pressure, could be related to OSA and low oxygen saturation.   . Refusal of blood transfusions as patient is Jehovah's Witness   . Sinus bradycardia     ROS:   All systems reviewed and negative except as noted in the HPI.   Past Surgical History:  Procedure Laterality Date  . BIOPSY  12/03/2017   Procedure: BIOPSY;  Surgeon: Otis Brace, MD;  [facility];  Service: Gastroenterology;;  . CARDIAC CATHETERIZATION     normal coronary arteries  . CARDIAC CATHETERIZATION N/A 10/31/2014   Procedure: Left Heart Cath and Coronary Angiography;  Surgeon: Leonie Man, MD;  Location: [place] [facility];  Service: Cardiovascular;  Laterality: N/A;  . CARDIOVERSION N/A 01/11/2019   Procedure: CARDIOVERSION;  Surgeon: Larey Dresser, MD;  Location[facility]COPY;  Service: Cardiovascular;  Laterality: N/A;  . CATARACT EXTRACTION Bilateral   . [place]  . COLONOSCOPY WITH PROPOFOL N/A 12/03/2017   Procedure: COLONOSCOPY WITH PROPOFOL;  Surgeon: Otis Brace, MD;  Location: [place];  Service: Gastroenterology;  Laterality: N/A;  . ELECTROPHYSIOLOGIC STUDY N/A 04/24/2015   Procedure: Atrial Fibrillation Ablation;  Surgeon: [name], MD;  Location: [place] CV LAB;  Service: Cardiovascular;  Laterality: N/A;  . FLEXIBLE SIGMOIDOSCOPY N/A 11/30/2017   Procedure: FLEXIBLE SIGMOIDOSCOPY;  Surgeon: Otis Brace, MD;  Location: [place];  Service: Gastroenterology;  Laterality: N/A;  . RIGHT HEART CATH N/A 06/25/2016   Procedure: Right Heart Cath;  Surgeon: Sherren Mocha, MD;  Location: [place] CV LAB;  Service: Cardiovascular;  Laterality: N/A;  . RIGHT HEART CATH N/A 06/10/2017   Procedure: RIGHT HEART CATH;  Surgeon:  Larey Dresser, MD;  Location: [place] CV LAB;  Service: Cardiovascular;  Laterality: N/A;  . RIGHT HEART CATHETERIZATION N/A 06/01/2013   Procedure: RIGHT HEART CATH;  Surgeon: Larey Dresser, MD;  Location: [facility] CATH LAB;  Service: Cardiovascular;  Laterality: N/A;  . TEE WITHOUT CARDIOVERSION N/A 04/23/2015   Procedure: TRANSESOPHAGEAL ECHOCARDIOGRAM (TEE);  Surgeon: Josue Hector, MD;  Location: [facility] ENDOSCOPY;  Service: Cardiovascular;  Laterality: N/A;     Family History  Problem Relation Age of Onset  . Heart disease Sister        Good  Pastures Syndrome  . Gout Father   . Lung cancer Father        smoked  . Breast cancer Paternal Aunt      Social History   Socioeconomic History  . Marital status: Widowed    Spouse name: Not on file  . Number of children: 2  . Years of education: Not on file  . Highest education level: Not on file  Occupational History  . Occupation: retired. still works for [employer].   Social Needs  . Financial resource strain: Not hard at all  . Food insecurity    Worry: Never true    Inability: Never true  . Transportation needs    Medical: No    Non-medical: No  Tobacco Use  . Smoking status: Former Smoker    Packs/day: 0.50    Years: 44.00    Pack years: 22.00    Types: Cigarettes    Quit date: 04/07/2009    Years since quitting: 9.8  . Smokeless tobacco: Never Used  Substance and Sexual Activity  . Alcohol use: No  . Drug use: No  . Sexual activity: Never  Lifestyle  . Physical activity    Days per week: Not on file    Minutes per session: Not on file  . Stress: Not on file  Relationships  . Social Herbalist on phone: Not on file    Gets together: Not on file    Attends religious service: Not on file    Active member of club or organization: Not on file    Attends meetings of clubs or organizations: Not on file    Relationship status: Not on file  . Intimate partner violence    Fear of current or ex partner: Not on  file    Emotionally abused: Not on file    Physically abused: Not on file    Forced sexual activity: Not on file  Other Topics Concern  . Not on file  Social History Narrative   Pt lives alone with 2 dogs.      BP (!) 88/56   Pulse (!) 132   Ht _0  (1.6 m)   Wt 204 lb (92.5 kg)   SpO2 92%   BMI 36.14 kg/m   Physical Exam:  Well appearing NAD HEENT: Unremarkable Neck:  No JVD, no thyromegally Lymphatics:  No adenopathy Back:  No CVA tenderness Lungs:  Clear with no wheezes HEART:  Regular rate rhythm, no murmurs, no rubs, no clicks Abd:  soft, positive bowel sounds, no organomegally, no rebound, no guarding Ext:  2 plus pulses, no edema, no cyanosis, no clubbing Skin:  No rashes no nodules Neuro:  CN II through XII intact, motor grossly intact  EKG - atypical atrial flutter with a RVR  Assess/Plan: 1. Atypical atrial flutter and fib - the patient's VR has been poorly controlled. I have discussed the indications/risks/benefits/goals/expectations of AV node ablation and PM insertion. I would anticipate placing a RV to pace the left bundle. I have recommended she stop the multaq.  2. Coags - we will hold her systemic anti-coagulation prior to her procedure.  Mikle Bosworth.D.

## 2019-01-25 NOTE — Patient Instructions (Signed)
Medication Instructions:  Your physician recommends that you continue on your current medications as directed. Please refer to the Current Medication list given to you today.  Labwork: You will get lab work today:  BMP and CBC  Testing/Procedures: Your physician has recommended that you have an ablation. Catheter ablation is a medical procedure used to treat some cardiac arrhythmias (irregular heartbeats). During catheter ablation, a long, thin, flexible tube is put into a blood vessel in your groin (upper thigh), or neck. This tube is called an ablation catheter. It is then guided to your heart through the blood vessel. Radio frequency waves destroy small areas of heart tissue where abnormal heartbeats may cause an arrhythmia to start. Please see the instruction sheet given to you today.  Your physician has recommended that you have a pacemaker inserted. A pacemaker is a small device that is placed under the skin of your chest or abdomen to help control abnormal heart rhythms. This device uses electrical pulses to prompt the heart to beat at a normal rate. Pacemakers are used to treat heart rhythms that are too slow. Wire (leads) are attached to the pacemaker that goes into the chambers of you heart. This is done in the hospital and usually requires and overnight stay. Please see the instruction sheet given to you today for more information.  Follow-Up:  See instruction letter   Any Other Special Instructions Will Be Listed Below (If Applicable).  If you need a refill on your cardiac medications before your next appointment, please call your pharmacy.    Pacemaker Implantation, Adult Pacemaker implantation is a procedure to place a pacemaker inside your chest. A pacemaker is a small computer that sends electrical signals to the heart and helps your heart beat normally. A pacemaker also stores information about your heart rhythms. You may need pacemaker implantation if you:  Have a slow  heartbeat (bradycardia).  Faint (syncope).  Have shortness of breath (dyspnea) due to heart problems. The pacemaker attaches to your heart through a wire, called a lead. Sometimes just one lead is needed. Other times, there will be two leads. There are two types of pacemakers:  Transvenous pacemaker. This type is placed under the skin or muscle of your chest. The lead goes through a vein in the chest area to reach the inside of the heart.  Epicardial pacemaker. This type is placed under the skin or muscle of your chest or belly. The lead goes through your chest to the outside of the heart. Tell a health care provider about:  Any allergies you have.  All medicines you are taking, including vitamins, herbs, eye drops, creams, and over-the-counter medicines.  Any problems you or family members have had with anesthetic medicines.  Any blood or bone disorders you have.  Any surgeries you have had.  Any medical conditions you have.  Whether you are pregnant or may be pregnant. What are the risks? Generally, this is a safe procedure. However, problems may occur, including:  Infection.  Bleeding.  Failure of the pacemaker or the lead.  Collapse of a lung or bleeding into a lung.  Blood clot inside a blood vessel with a lead.  Damage to the heart.  Infection inside the heart (endocarditis).  Allergic reactions to medicines. What happens before the procedure? Staying hydrated Follow instructions from your health care provider about hydration, which may include:  Up to 2 hours before the procedure - you may continue to drink clear liquids, such as water, clear fruit juice,  black coffee, and plain tea. Eating and drinking restrictions Follow instructions from your health care provider about eating and drinking, which may include:  8 hours before the procedure - stop eating heavy meals or foods such as meat, fried foods, or fatty foods.  6 hours before the procedure - stop  eating light meals or foods, such as toast or cereal.  6 hours before the procedure - stop drinking milk or drinks that contain milk.  2 hours before the procedure - stop drinking clear liquids. Medicines  Ask your health care provider about: ? Changing or stopping your regular medicines. This is especially important if you are taking diabetes medicines or blood thinners. ? Taking medicines such as aspirin and ibuprofen. These medicines can thin your blood. Do not take these medicines before your procedure if your health care provider instructs you not to.  You may be given antibiotic medicine to help prevent infection. General instructions  You will have a heart evaluation. This may include an electrocardiogram (ECG), chest X-ray, and heart imaging (echocardiogram,  or echo) tests.  You will have blood tests.  Do not use any products that contain nicotine or tobacco, such as cigarettes and e-cigarettes. If you need help quitting, ask your health care provider.  Plan to have someone take you home from the hospital or clinic.  If you will be going home right after the procedure, plan to have someone with you for 24 hours.  Ask your health care provider how your surgical site will be marked or identified. What happens during the procedure?  To reduce your risk of infection: ? Your health care team will wash or sanitize their hands. ? Your skin will be washed with soap. ? Hair may be removed from the surgical area.  An IV tube will be inserted into one of your veins.  You will be given one or more of the following: ? A medicine to help you relax (sedative). ? A medicine to numb the area (local anesthetic). ? A medicine to make you fall asleep (general anesthetic).  If you are getting a transvenous pacemaker: ? An incision will be made in your upper chest. ? A pocket will be made for the pacemaker. It may be placed under the skin or between layers of muscle. ? The lead will be  inserted into a blood vessel that returns to the heart. ? While X-rays are taken by an imaging machine (fluoroscopy), the lead will be advanced through the vein to the inside of your heart. ? The other end of the lead will be tunneled under the skin and attached to the pacemaker.  If you are getting an epicardial pacemaker: ? An incision will be made near your ribs or breastbone (sternum) for the lead. ? The lead will be attached to the outside of your heart. ? Another incision will be made in your chest or upper belly to create a pocket for the pacemaker. ? The free end of the lead will be tunneled under the skin and attached to the pacemaker.  The transvenous or epicardial pacemaker will be tested. Imaging studies may be done to check the lead position.  The incisions will be closed with stitches (sutures), adhesive strips, or skin glue.  Bandages (dressing) will be placed over the incisions. The procedure may vary among health care providers and hospitals. What happens after the procedure?  Your blood pressure, heart rate, breathing rate, and blood oxygen level will be monitored until the medicines you were  given have worn off.  You will be given antibiotics and pain medicine.  ECG and chest x-rays will be done.  You will wear a continuous type of ECG (Holter monitor) to check your heart rhythm.  Your health care provider will program the pacemaker.  Do not drive for 24 hours if you received a sedative. This information is not intended to replace advice given to you by your health care provider. Make sure you discuss any questions you have with your health care provider. Document Released: 03/14/2002 Document Revised: 12/11/2017 Document Reviewed: 09/05/2015 Elsevier Patient Education  2020 [employer].

## 2019-01-28 ENCOUNTER — Other Ambulatory Visit (HOSPITAL_COMMUNITY)
Admission: RE | Admit: 2019-01-28 | Discharge: 2019-01-28 | Disposition: A | Discharge status: Home / Self Care | Payer: Medicare Other | Source: Ambulatory Visit | Attending: Internal Medicine | Admitting: Internal Medicine

## 2019-01-28 DIAGNOSIS — Z01812 Encounter for preprocedural laboratory examination: Secondary | ICD-10-CM | POA: Insufficient documentation

## 2019-01-28 DIAGNOSIS — Z20828 Contact with and (suspected) exposure to other viral communicable diseases: Secondary | ICD-10-CM | POA: Insufficient documentation

## 2019-01-31 ENCOUNTER — Ambulatory Visit (HOSPITAL_COMMUNITY)
Admission: RE | Admit: 2019-01-31 | Discharge: 2019-02-01 | Disposition: A | Discharge status: Home / Self Care | Payer: Medicare Other | Attending: Internal Medicine | Admitting: Internal Medicine

## 2019-01-31 ENCOUNTER — Other Ambulatory Visit: Payer: Self-pay

## 2019-01-31 ENCOUNTER — Ambulatory Visit (HOSPITAL_COMMUNITY)
Admission: RE | Disposition: A | Discharge status: Home / Self Care | Payer: Self-pay | Source: Home / Self Care | Attending: Internal Medicine

## 2019-01-31 ENCOUNTER — Encounter (HOSPITAL_COMMUNITY): Payer: Self-pay | Admitting: Internal Medicine

## 2019-01-31 DIAGNOSIS — J449 Chronic obstructive pulmonary disease, unspecified: Secondary | ICD-10-CM | POA: Insufficient documentation

## 2019-01-31 DIAGNOSIS — I5032 Chronic diastolic (congestive) heart failure: Secondary | ICD-10-CM | POA: Diagnosis not present

## 2019-01-31 DIAGNOSIS — I2510 Atherosclerotic heart disease of native coronary artery without angina pectoris: Secondary | ICD-10-CM | POA: Insufficient documentation

## 2019-01-31 DIAGNOSIS — E039 Hypothyroidism, unspecified: Secondary | ICD-10-CM | POA: Insufficient documentation

## 2019-01-31 DIAGNOSIS — G4733 Obstructive sleep apnea (adult) (pediatric): Secondary | ICD-10-CM | POA: Insufficient documentation

## 2019-01-31 DIAGNOSIS — Z950 Presence of cardiac pacemaker: Secondary | ICD-10-CM

## 2019-01-31 DIAGNOSIS — I130 Hypertensive heart and chronic kidney disease with heart failure and stage 1 through stage 4 chronic kidney disease, or unspecified chronic kidney disease: Secondary | ICD-10-CM | POA: Insufficient documentation

## 2019-01-31 DIAGNOSIS — I442 Atrioventricular block, complete: Secondary | ICD-10-CM | POA: Diagnosis not present

## 2019-01-31 DIAGNOSIS — I272 Pulmonary hypertension, unspecified: Secondary | ICD-10-CM | POA: Diagnosis not present

## 2019-01-31 DIAGNOSIS — Z7901 Long term (current) use of anticoagulants: Secondary | ICD-10-CM | POA: Insufficient documentation

## 2019-01-31 DIAGNOSIS — Z8249 Family history of ischemic heart disease and other diseases of the circulatory system: Secondary | ICD-10-CM | POA: Diagnosis not present

## 2019-01-31 DIAGNOSIS — Z79890 Hormone replacement therapy (postmenopausal): Secondary | ICD-10-CM | POA: Diagnosis not present

## 2019-01-31 DIAGNOSIS — I4819 Other persistent atrial fibrillation: Secondary | ICD-10-CM

## 2019-01-31 DIAGNOSIS — Z87891 Personal history of nicotine dependence: Secondary | ICD-10-CM | POA: Insufficient documentation

## 2019-01-31 DIAGNOSIS — I484 Atypical atrial flutter: Secondary | ICD-10-CM | POA: Insufficient documentation

## 2019-01-31 DIAGNOSIS — E785 Hyperlipidemia, unspecified: Secondary | ICD-10-CM | POA: Diagnosis not present

## 2019-01-31 DIAGNOSIS — Z79899 Other long term (current) drug therapy: Secondary | ICD-10-CM | POA: Insufficient documentation

## 2019-01-31 DIAGNOSIS — Z888 Allergy status to other drugs, medicaments and biological substances status: Secondary | ICD-10-CM | POA: Diagnosis not present

## 2019-01-31 DIAGNOSIS — H3530 Unspecified macular degeneration: Secondary | ICD-10-CM | POA: Diagnosis not present

## 2019-01-31 DIAGNOSIS — K219 Gastro-esophageal reflux disease without esophagitis: Secondary | ICD-10-CM | POA: Diagnosis not present

## 2019-01-31 DIAGNOSIS — N189 Chronic kidney disease, unspecified: Secondary | ICD-10-CM | POA: Insufficient documentation

## 2019-01-31 DIAGNOSIS — I422 Other hypertrophic cardiomyopathy: Secondary | ICD-10-CM | POA: Insufficient documentation

## 2019-01-31 DIAGNOSIS — I4891 Unspecified atrial fibrillation: Secondary | ICD-10-CM | POA: Diagnosis not present

## 2019-01-31 HISTORY — PX: PACEMAKER IMPLANT: EP1218

## 2019-01-31 HISTORY — PX: AV NODE ABLATION: EP1193

## 2019-01-31 LAB — SURGICAL PCR SCREEN
MRSA, PCR: NEGATIVE
Staphylococcus aureus: NEGATIVE

## 2019-01-31 LAB — NOVEL CORONAVIRUS, NAA (HOSP ORDER, SEND-OUT TO REF LAB; TAT 18-24 HRS): SARS-CoV-2, NAA: NOT DETECTED

## 2019-01-31 SURGERY — AV NODE ABLATION

## 2019-01-31 MED ORDER — LIDOCAINE HCL (PF) 1 % IJ SOLN
INTRAMUSCULAR | Status: AC
  Filled 2019-01-31: qty 30

## 2019-01-31 MED ORDER — MUPIROCIN 2 % EX OINT
TOPICAL_OINTMENT | CUTANEOUS | Status: AC
  Administered 2019-01-31: 07:00:00
  Filled 2019-01-31: qty 22

## 2019-01-31 MED ORDER — CEFAZOLIN SODIUM-DEXTROSE 2-4 GM/100ML-% IV SOLN
INTRAVENOUS | Status: AC
  Filled 2019-01-31: qty 100

## 2019-01-31 MED ORDER — CHLORHEXIDINE GLUCONATE 4 % EX LIQD: 4.0000 "application " | Freq: Once | CUTANEOUS | Status: DC

## 2019-01-31 MED ORDER — VITAMIN B-12 1000 MCG PO TABS
1000.0000 ug | ORAL_TABLET | Freq: Every day | ORAL | Status: DC
  Administered 2019-01-31: 1000 ug via ORAL
  Filled 2019-01-31: qty 1

## 2019-01-31 MED ORDER — SODIUM CHLORIDE 0.9 % IV SOLN
INTRAVENOUS | Status: DC
  Administered 2019-01-31: 07:00:00 via INTRAVENOUS

## 2019-01-31 MED ORDER — SODIUM CHLORIDE 0.9 % IV SOLN
INTRAVENOUS | Status: AC
  Filled 2019-01-31: qty 2

## 2019-01-31 MED ORDER — MACITENTAN 10 MG PO TABS
10.0000 mg | ORAL_TABLET | Freq: Every day | ORAL | Status: DC
  Administered 2019-01-31 – 2019-02-01 (×2): 10 mg via ORAL
  Filled 2019-01-31 (×4): qty 1

## 2019-01-31 MED ORDER — CEFAZOLIN SODIUM-DEXTROSE 1-4 GM/50ML-% IV SOLN
1.0000 g | Freq: Four times a day (QID) | INTRAVENOUS | Status: AC
  Administered 2019-01-31 – 2019-02-01 (×3): 1 g via INTRAVENOUS
  Filled 2019-01-31 (×3): qty 50

## 2019-01-31 MED ORDER — FENTANYL CITRATE (PF) 100 MCG/2ML IJ SOLN
INTRAMUSCULAR | Status: DC | PRN
  Administered 2019-01-31 (×3): 12.5 ug via INTRAVENOUS

## 2019-01-31 MED ORDER — CEFAZOLIN SODIUM-DEXTROSE 2-4 GM/100ML-% IV SOLN
2.0000 g | INTRAVENOUS | Status: AC
  Administered 2019-01-31: 2 g via INTRAVENOUS

## 2019-01-31 MED ORDER — BUPIVACAINE HCL (PF) 0.25 % IJ SOLN
INTRAMUSCULAR | Status: DC | PRN
  Administered 2019-01-31: 30 mL

## 2019-01-31 MED ORDER — HEPARIN (PORCINE) IN NACL 1000-0.9 UT/500ML-% IV SOLN
INTRAVENOUS | Status: AC
  Filled 2019-01-31: qty 500

## 2019-01-31 MED ORDER — FOLIC ACID 1 MG PO TABS
1.0000 mg | ORAL_TABLET | Freq: Every day | ORAL | Status: DC
  Administered 2019-01-31: 1 mg via ORAL
  Filled 2019-01-31: qty 1

## 2019-01-31 MED ORDER — LIDOCAINE HCL (PF) 1 % IJ SOLN
INTRAMUSCULAR | Status: DC | PRN
  Administered 2019-01-31: 60 mL

## 2019-01-31 MED ORDER — SODIUM CHLORIDE 0.9 % IV SOLN: 80.0000 mg | INTRAVENOUS | Status: DC

## 2019-01-31 MED ORDER — HYDROCODONE-ACETAMINOPHEN 5-325 MG PO TABS
1.0000 | ORAL_TABLET | Freq: Four times a day (QID) | ORAL | Status: DC | PRN
  Administered 2019-01-31: 1 via ORAL
  Filled 2019-01-31: qty 1

## 2019-01-31 MED ORDER — COLCHICINE 0.6 MG PO TABS
0.6000 mg | ORAL_TABLET | Freq: Every day | ORAL | Status: DC
  Administered 2019-02-01: 0.6 mg via ORAL
  Filled 2019-01-31: qty 1

## 2019-01-31 MED ORDER — ACETAMINOPHEN 325 MG PO TABS: 325.0000 mg | ORAL_TABLET | ORAL | Status: DC | PRN

## 2019-01-31 MED ORDER — MIDAZOLAM HCL 5 MG/5ML IJ SOLN
INTRAMUSCULAR | Status: DC | PRN
  Administered 2019-01-31 (×3): 1 mg via INTRAVENOUS

## 2019-01-31 MED ORDER — HEPARIN (PORCINE) IN NACL 1000-0.9 UT/500ML-% IV SOLN
INTRAVENOUS | Status: DC | PRN
  Administered 2019-01-31 (×2): 500 mL

## 2019-01-31 MED ORDER — ONDANSETRON HCL 4 MG/2ML IJ SOLN: 4.0000 mg | Freq: Four times a day (QID) | INTRAMUSCULAR | Status: DC | PRN

## 2019-01-31 MED ORDER — SELEXIPAG 1600 MCG PO TABS
1600.0000 ug | ORAL_TABLET | Freq: Two times a day (BID) | ORAL | Status: DC
  Administered 2019-01-31 – 2019-02-01 (×2): 1600 ug via ORAL
  Filled 2019-01-31 (×2): qty 1

## 2019-01-31 MED ORDER — FUROSEMIDE 20 MG PO TABS: 20.0000 mg | ORAL_TABLET | Freq: Every day | ORAL | Status: DC

## 2019-01-31 MED ORDER — AZATHIOPRINE 50 MG PO TABS
100.0000 mg | ORAL_TABLET | Freq: Every day | ORAL | Status: DC
  Administered 2019-01-31: 100 mg via ORAL
  Filled 2019-01-31: qty 2

## 2019-01-31 MED ORDER — LEVOTHYROXINE SODIUM 100 MCG PO TABS
100.0000 ug | ORAL_TABLET | Freq: Every day | ORAL | Status: DC
  Administered 2019-02-01: 100 ug via ORAL
  Filled 2019-01-31: qty 1

## 2019-01-31 MED ORDER — MIDAZOLAM HCL 5 MG/5ML IJ SOLN
INTRAMUSCULAR | Status: AC
  Filled 2019-01-31: qty 5

## 2019-01-31 MED ORDER — FENTANYL CITRATE (PF) 100 MCG/2ML IJ SOLN
INTRAMUSCULAR | Status: AC
  Filled 2019-01-31: qty 2

## 2019-01-31 SURGICAL SUPPLY — 15 items
CABLE SURGICAL S-101-97-12 (CABLE) ×3 IMPLANT
CATH CELSIUS THERMO F CV 7FR (ABLATOR) ×3 IMPLANT
CATH RIGHTSITE C315HIS02 (CATHETERS) ×3 IMPLANT
IPG PACE AZUR XT SR MRI W1SR01 (Pacemaker) ×1 IMPLANT
LEAD SELECT SECURE 3830 383069 (Lead) ×1 IMPLANT
PACE AZURE XT SR MRI W1SR01 (Pacemaker) ×3 IMPLANT
PACK EP LATEX FREE (CUSTOM PROCEDURE TRAY) ×2
PACK EP LF (CUSTOM PROCEDURE TRAY) ×1 IMPLANT
PAD PRO RADIOLUCENT 2001M-C (PAD) ×3 IMPLANT
SELECT SECURE 3830 383069 (Lead) ×3 IMPLANT
SHEATH 7FR PRELUDE SNAP 13 (SHEATH) ×6 IMPLANT
SHEATH PINNACLE 8F 10CM (SHEATH) ×3 IMPLANT
SLITTER 6232ADJ (MISCELLANEOUS) ×3 IMPLANT
TRAY PACEMAKER INSERTION (PACKS) ×3 IMPLANT
WIRE HI TORQ VERSACORE-J 145CM (WIRE) ×3 IMPLANT

## 2019-01-31 NOTE — Progress Notes (Signed)
Site area: rt groin fv sheath Site Prior to Removal:  Level 0 Pressure Applied For: 10 minutes Manual:   yes Patient Status During Pull:  stable Post Pull Site:  Level 0 Post Pull Instructions Given:  yes Post Pull Pulses Present: rt dp palpable Dressing Applied:  Gauze and tegaderm Bedrest begins @ 0945 Comments:

## 2019-01-31 NOTE — Interval H&P Note (Signed)
History and Physical Interval Note:  01/31/2019 7:29 AM  Amy [name]  has presented today for surgery, with the diagnosis of afib with rvr.  The various methods of treatment have been discussed with the patient and family. After consideration of risks, benefits and other options for treatment, the patient has consented to  Procedure(s): AV NODE ABLATION (N/A) PACEMAKER IMPLANT (N/A) as a surgical intervention.  The patient's history has been reviewed, patient examined, no change in status, stable for surgery.  I have reviewed the patient's chart and labs.  Questions were answered to the patient's satisfaction.     Cristopher Peru

## 2019-01-31 NOTE — Discharge Summary (Addendum)
ELECTROPHYSIOLOGY PROCEDURE DISCHARGE SUMMARY    Patient ID: Amy [name],  MRN: [id number], DOB/AGE: March 23, 1946 73 y.o.  Admit date: 01/31/2019 Discharge date: 02/01/2019  Primary Care Physician: Aretta Nip, MD Primary Cardiologist: Aundra Dubin Electrophysiologist: Lovena Le  Primary Discharge Diagnosis:  AF with RVR status post pacemaker implantation and AVN ablation this admission  Secondary Discharge Diagnosis:  1.  CAD 2.  OSA 3.  CKD 4.  HTN 5.  Macular degeneration 6.  OSA 7.  Pulmonary hypertension 8.  Chronic diastolic heart failure  Allergies  Allergen Reactions   Amiodarone     Dyspnea - felt to be 2/2 amio lung toxicity Has tolerated Omnipaque    Cellcept [Mycophenolate Mofetil] Other (See Comments)    Gastric Bleeding   Zyban [Bupropion] Itching     Procedures This Admission:  1.  Implantation of a MDT single chamber (left bundle) PPM on 01/31/19 by Dr Lovena Le.  The patient received a MDT model number Azure PPM with model number 8099 right ventricular lead. There were no immediate post procedure complications. 2. AVN ablation on 01/31/19 by Dr Lovena Le 3.  CXR on 02/01/19 demonstrated no pneumothorax status post device implantation.   Brief HPI: Amy [name] is a 73 y.o. female with a history of persistent atrial arrhythmias with uncontrolled ventricular rate. She was seen by Dr Lovena Le in the outpatient setting and pacemaker/AVN ablation was recommended. Risks, benefits were reviewed with the patient who wished to proceed.  Hospital Course:  The patient was admitted and underwent implantation of a MDT single chamber PPM and AVN ablation with details as outlined above.  She  was monitored on telemetry overnight which demonstrated V pacing at 90bpm.  Left chest was without hematoma or ecchymosis.  The device was interrogated and found to be functioning normally.  CXR was obtained and demonstrated no pneumothorax status post device  implantation.  Wound care, arm mobility, and restrictions were reviewed with the patient.  The patient was examined and considered stable for discharge to home.    Physical Exam: Vitals:   02/01/19 0234 02/01/19 0304 02/01/19 0334 02/01/19 0404  BP: 93/63 (!) _0  Pulse: 89 90 89 90  Resp: _1 Temp:    97.9 F (36.6 C)  TempSrc:    Oral  SpO2: 93% 92% 94% 95%  Weight:    87.5 kg  Height:        GEN- The patient is well appearing, alert and oriented x 3 today.   HEENT: normocephalic, atraumatic; sclera clear, conjunctiva pink; hearing intact; oropharynx clear; neck supple  Lungs- Clear to ausculation bilaterally, normal work of breathing.  No wheezes, rales, rhonchi Heart- Regular rate and rhythm (paced) GI- soft, non-tender, non-distended, bowel sounds present, no hepatosplenomegaly Extremities- no clubbing, cyanosis, or edema  MS- no significant deformity or atrophy Skin- warm and dry, no rash or lesion, left chest without hematoma/ecchymosis Psych- euthymic mood, full affect Neuro- strength and sensation are intact   Labs:   Lab Results  Component Value Date   WBC 8.1 01/25/2019   HGB 11.3 01/25/2019   HCT 34.4 01/25/2019   MCV 88 01/25/2019   PLT 303 01/25/2019    Recent Labs  Lab 01/25/19 1023  NA 138  K 4.8  CL 105  CO2 18*  BUN 22  CREATININE 1.55*  CALCIUM 8.9  GLUCOSE 78    Discharge Medications:  Allergies as of 02/01/2019      Reactions  Amiodarone    Dyspnea - felt to be 2/2 amio lung toxicity Has tolerated Omnipaque    Cellcept [mycophenolate Mofetil] Other (See Comments)   Gastric Bleeding   Zyban [bupropion] Itching      Medication List    STOP taking these medications   Multaq 400 MG tablet Generic drug: dronedarone     TAKE these medications   acetaminophen 325 MG tablet Commonly known as: TYLENOL Take 2 tablets (650 mg total) by mouth every 4 (four) hours as needed for headache or mild pain.   apixaban 5  MG Tabs tablet Commonly known as: ELIQUIS Take 1 tablet (5 mg total) by mouth 2 (two) times daily. Restart 02/04/19 What changed: additional instructions   atorvastatin 80 MG tablet Commonly known as: LIPITOR Take 1 tablet (80 mg total) by mouth daily with lunch. What changed: when to take this   azaTHIOprine 50 MG tablet Commonly known as: IMURAN Take 100 mg by mouth at bedtime.   Colcrys 0.6 MG tablet Generic drug: colchicine TAKE 1 TABLET BY MOUTH DAILY What changed: how much to take   colestipol 1 g tablet Commonly known as: COLESTID Take 2 g by mouth at bedtime as needed (digestive issues.).   ezetimibe 10 MG tablet Commonly known as: ZETIA Take 10 mg by mouth daily.   folic acid 1 MG tablet Commonly known as: FOLVITE Take 1 mg by mouth daily in the afternoon. In the afternoon.   furosemide 20 MG tablet Commonly known as: LASIX Take 20 mg by mouth daily at 12 noon.   HYDROcodone-acetaminophen 5-325 MG tablet Commonly known as: NORCO/VICODIN Take 1 tablet by mouth every 6 (six) hours as needed for moderate pain.   levothyroxine 100 MCG tablet Commonly known as: SYNTHROID Take 100 mcg by mouth daily.   macitentan 10 MG tablet Commonly known as: Opsumit Take 1 tablet (10 mg total) by mouth daily.   metoprolol succinate 25 MG 24 hr tablet Commonly known as: Toprol XL Take 1 tablet (25 mg total) by mouth daily with supper.   multivitamin capsule Take 1 capsule by mouth every evening.   mupirocin nasal ointment 2 % Commonly known as: BACTROBAN Place 1 application into the nose daily as needed (nose sores).   neomycin-bacitracin-polymyxin ointment Commonly known as: NEOSPORIN Apply 1 application topically as needed for wound care.   ondansetron 4 MG disintegrating tablet Commonly known as: ZOFRAN-ODT Take 1 tablet (4 mg total) by mouth every 8 (eight) hours as needed for nausea or vomiting.   OVER THE COUNTER MEDICATION Take 1 tablet by mouth 2 (two)  times daily as needed (cold symptoms). Counter-act otc cold med   pantoprazole 40 MG tablet Commonly known as: PROTONIX TAKE 1 TABLET BY MOUTH EVERY DAY What changed: when to take this   potassium chloride SA 20 MEQ tablet Commonly known as: KLOR-CON Take 10 mEq by mouth daily.   UNABLE TO FIND Med Name: CPAP  [facility]   Uptravi 1600 MCG Tabs Generic drug: Selexipag TAKE 1 TABLET TWO TIMES A DAY What changed: how much to take   vitamin B-12 1000 MCG tablet Commonly known as: CYANOCOBALAMIN Take 1,000 mcg by mouth daily in the afternoon.   Vitamin D 50 MCG (2000 UT) Caps Take 2,000 Units by mouth daily at 12 noon.       Disposition:   Follow-up Information    Baldwin Jamaica, PA-C Follow up on 02/10/2019.   Specialty: Cardiology Why: AT 11am Contact information: [address]  [address] [place] [address] [phone]        Evans Lance, MD Follow up on 03/01/2019.   Specialty: Cardiology Why: at 12:15PM Contact information: [address] [address] [phone]           Duration of Discharge Encounter: Greater than 30 minutes including physician time.  Signed, Chanetta Marshall, NP 02/01/2019 8:18 AM  EP Attending  Patient seen and examined. Agree with the findings as noted above. The patient has undergone insertion of a single chamber PPM followed by AV node ablation for uncontrolled atrial fib. Her single chamber PPM has been interrogated under my direct supervision and is working normally. She will be discharged home with usual followup.   Mikle Bosworth.D.

## 2019-02-01 ENCOUNTER — Telehealth: Payer: Self-pay

## 2019-02-01 ENCOUNTER — Ambulatory Visit (HOSPITAL_COMMUNITY): Payer: Medicare Other

## 2019-02-01 ENCOUNTER — Encounter (HOSPITAL_COMMUNITY): Payer: Self-pay | Admitting: Internal Medicine

## 2019-02-01 DIAGNOSIS — I4891 Unspecified atrial fibrillation: Secondary | ICD-10-CM | POA: Diagnosis not present

## 2019-02-01 DIAGNOSIS — E785 Hyperlipidemia, unspecified: Secondary | ICD-10-CM | POA: Diagnosis not present

## 2019-02-01 DIAGNOSIS — G4733 Obstructive sleep apnea (adult) (pediatric): Secondary | ICD-10-CM | POA: Diagnosis not present

## 2019-02-01 DIAGNOSIS — I272 Pulmonary hypertension, unspecified: Secondary | ICD-10-CM | POA: Diagnosis not present

## 2019-02-01 DIAGNOSIS — N189 Chronic kidney disease, unspecified: Secondary | ICD-10-CM | POA: Diagnosis not present

## 2019-02-01 DIAGNOSIS — I5032 Chronic diastolic (congestive) heart failure: Secondary | ICD-10-CM | POA: Diagnosis not present

## 2019-02-01 DIAGNOSIS — I4819 Other persistent atrial fibrillation: Secondary | ICD-10-CM | POA: Diagnosis not present

## 2019-02-01 DIAGNOSIS — Z87891 Personal history of nicotine dependence: Secondary | ICD-10-CM | POA: Diagnosis not present

## 2019-02-01 DIAGNOSIS — H3530 Unspecified macular degeneration: Secondary | ICD-10-CM | POA: Diagnosis not present

## 2019-02-01 DIAGNOSIS — I484 Atypical atrial flutter: Secondary | ICD-10-CM | POA: Diagnosis not present

## 2019-02-01 DIAGNOSIS — I442 Atrioventricular block, complete: Secondary | ICD-10-CM | POA: Diagnosis not present

## 2019-02-01 DIAGNOSIS — E039 Hypothyroidism, unspecified: Secondary | ICD-10-CM | POA: Diagnosis not present

## 2019-02-01 DIAGNOSIS — I130 Hypertensive heart and chronic kidney disease with heart failure and stage 1 through stage 4 chronic kidney disease, or unspecified chronic kidney disease: Secondary | ICD-10-CM | POA: Diagnosis not present

## 2019-02-01 DIAGNOSIS — Z950 Presence of cardiac pacemaker: Secondary | ICD-10-CM | POA: Diagnosis not present

## 2019-02-01 MED ORDER — OFF THE BEAT BOOK
Freq: Once | Status: AC
  Administered 2019-02-01: 1
  Filled 2019-02-01: qty 1

## 2019-02-01 MED ORDER — APIXABAN 5 MG PO TABS: 5.0000 mg | ORAL_TABLET | Freq: Two times a day (BID) | ORAL | 3 refills | Status: DC

## 2019-02-01 MED ORDER — YOU HAVE A PACEMAKER BOOK
Freq: Once | Status: AC
  Administered 2019-02-01: 1
  Filled 2019-02-01: qty 1

## 2019-02-01 MED FILL — Gentamicin Sulfate Inj 40 MG/ML: INTRAMUSCULAR | Qty: 80 | Status: AC

## 2019-02-01 MED FILL — Cefazolin Sodium-Dextrose IV Solution 2 GM/100ML-4%: INTRAVENOUS | Qty: 100 | Status: AC

## 2019-02-01 NOTE — Discharge Instructions (Signed)
Supplemental Discharge Instructions for  Pacemaker/Defibrillator Patients  Activity No heavy lifting or vigorous activity with your left/right arm for 6 to 8 weeks.  Do not raise your left/right arm above your head for one week.  Gradually raise your affected arm as drawn below.           __        10/30                  10/31                      11/1                          11/2  NO DRIVING for 1 week    ; you may begin driving on   81/8/56  .  WOUND CARE - Keep the wound area clean and dry.  Do not get this area wet for one week. No showers for one week; you may shower on  02/07/19   . - The tape/steri-strips on your wound will fall off; do not pull them off.  No bandage is needed on the site.  DO  NOT apply any creams, oils, or ointments to the wound area. - If you notice any drainage or discharge from the wound, any swelling or bruising at the site, or you develop a fever > 101? F after you are discharged home, call the office at once.  Special Instructions - You are still able to use cellular telephones; use the ear opposite the side where you have your pacemaker/defibrillator.  Avoid carrying your cellular phone near your device. - When traveling through airports, show security personnel your identification card to avoid being screened in the metal detectors.  Ask the security personnel to use the hand wand. - Avoid arc welding equipment, TENS units (transcutaneous nerve stimulators).  Call the office for questions about other devices. - Avoid electrical appliances that are in poor condition or are not properly grounded. - Microwave ovens are safe to be near or to operate.   Heart-Healthy Eating Plan Heart-healthy meal planning includes:  Eating less unhealthy fats.  Eating more healthy fats.  Making other changes in your diet. Talk with your doctor or a diet specialist (dietitian) to create an eating plan that is right for you. What is my plan? Your doctor may  recommend an eating plan that includes:  Total fat: ______% or less of total calories a day.  Saturated fat: ______% or less of total calories a day.  Cholesterol: less than _________mg a day. What are tips for following this plan? Cooking Avoid frying your food. Try to bake, boil, grill, or broil it instead. You can also reduce fat by:  Removing the skin from poultry.  Removing all visible fats from meats.  Steaming vegetables in water or broth. Meal planning   At meals, divide your plate into four equal parts: ? Fill one-half of your plate with vegetables and green salads. ? Fill one-fourth of your plate with whole grains. ? Fill one-fourth of your plate with lean protein foods.  Eat 4-5 servings of vegetables per day. A serving of vegetables is: ? 1 cup of raw or cooked vegetables. ? 2 cups of raw leafy greens.  Eat 4-5 servings of fruit per day. A serving of fruit is: ? 1 medium whole fruit. ?  cup of dried fruit. ?  cup of fresh,  frozen, or canned fruit. ?  cup of 100% fruit juice.  Eat more foods that have soluble fiber. These are apples, broccoli, carrots, beans, peas, and barley. Try to get 20-30 g of fiber per day.  Eat 4-5 servings of nuts, legumes, and seeds per week: ? 1 serving of dried beans or legumes equals  cup after being cooked. ? 1 serving of nuts is  cup. ? 1 serving of seeds equals 1 tablespoon. General information  Eat more home-cooked food. Eat less restaurant, buffet, and fast food.  Limit or avoid alcohol.  Limit foods that are high in starch and sugar.  Avoid fried foods.  Lose weight if you are overweight.  Keep track of how much salt (sodium) you eat. This is important if you have high blood pressure. Ask your doctor to tell you more about this.  Try to add vegetarian meals each week. Fats  Choose healthy fats. These include olive oil and canola oil, flaxseeds, walnuts, almonds, and seeds.  Eat more omega-3 fats. These  include salmon, mackerel, sardines, tuna, flaxseed oil, and ground flaxseeds. Try to eat fish at least 2 times each week.  Check food labels. Avoid foods with trans fats or high amounts of saturated fat.  Limit saturated fats. ? These are often found in animal products, such as meats, butter, and cream. ? These are also found in plant foods, such as palm oil, palm kernel oil, and coconut oil.  Avoid foods with partially hydrogenated oils in them. These have trans fats. Examples are stick margarine, some tub margarines, cookies, crackers, and other baked goods. What foods can I eat? Fruits All fresh, canned (in natural juice), or frozen fruits. Vegetables Fresh or frozen vegetables (raw, steamed, roasted, or grilled). Green salads. Grains Most grains. Choose whole wheat and whole grains most of the time. Rice and pasta, including brown rice and pastas made with whole wheat. Meats and other proteins Lean, well-trimmed beef, veal, pork, and lamb. Chicken and [place] without skin. All fish and shellfish. Wild duck, rabbit, pheasant, and venison. Egg whites or low-cholesterol egg substitutes. Dried beans, peas, lentils, and tofu. Seeds and most nuts. Dairy Low-fat or nonfat cheeses, including ricotta and mozzarella. Skim or 1% milk that is liquid, powdered, or evaporated. Buttermilk that is made with low-fat milk. Nonfat or low-fat yogurt. Fats and oils Non-hydrogenated (trans-free) margarines. Vegetable oils, including soybean, sesame, sunflower, olive, peanut, safflower, corn, canola, and cottonseed. Salad dressings or mayonnaise made with a vegetable oil. Beverages Mineral water. Coffee and tea. Diet carbonated beverages. Sweets and desserts Sherbet, gelatin, and fruit ice. Small amounts of dark chocolate. Limit all sweets and desserts. Seasonings and condiments All seasonings and condiments. The items listed above may not be a complete list of foods and drinks you can eat. Contact a  dietitian for more options. What foods should I avoid? Fruits Canned fruit in heavy syrup. Fruit in cream or butter sauce. Fried fruit. Limit coconut. Vegetables Vegetables cooked in cheese, cream, or butter sauce. Fried vegetables. Grains Breads that are made with saturated or trans fats, oils, or whole milk. Croissants. Sweet rolls. Donuts. High-fat crackers, such as cheese crackers. Meats and other proteins Fatty meats, such as hot dogs, ribs, sausage, bacon, rib-eye roast or steak. High-fat deli meats, such as salami and bologna. Caviar. Domestic duck and goose. Organ meats, such as liver. Dairy Cream, sour cream, cream cheese, and creamed cottage cheese. Whole-milk cheeses. Whole or 2% milk that is liquid, evaporated, or condensed. Whole buttermilk.  Cream sauce or high-fat cheese sauce. Yogurt that is made from whole milk. Fats and oils Meat fat, or shortening. Cocoa butter, hydrogenated oils, palm oil, coconut oil, palm kernel oil. Solid fats and shortenings, including bacon fat, salt pork, lard, and butter. Nondairy cream substitutes. Salad dressings with cheese or sour cream. Beverages Regular sodas and juice drinks with added sugar. Sweets and desserts Frosting. Pudding. Cookies. Cakes. Pies. Milk chocolate or white chocolate. Buttered syrups. Full-fat ice cream or ice cream drinks. The items listed above may not be a complete list of foods and drinks to avoid. Contact a dietitian for more information. Summary  Heart-healthy meal planning includes eating less unhealthy fats, eating more healthy fats, and making other changes in your diet.  Eat a balanced diet. This includes fruits and vegetables, low-fat or nonfat dairy, lean protein, nuts and legumes, whole grains, and heart-healthy oils and fats. This information is not intended to replace advice given to you by your health care provider. Make sure you discuss any questions you have with your health care provider. Document  Released: 09/23/2011 Document Revised: 05/28/2017 Document Reviewed: 05/01/2017 Elsevier Patient Education  2020 [employer].

## 2019-02-01 NOTE — Telephone Encounter (Signed)
error 

## 2019-02-04 ENCOUNTER — Encounter (HOSPITAL_COMMUNITY): Payer: Self-pay | Admitting: Cardiology

## 2019-02-04 ENCOUNTER — Ambulatory Visit (HOSPITAL_COMMUNITY)
Admission: RE | Admit: 2019-02-04 | Discharge: 2019-02-04 | Disposition: A | Discharge status: Home / Self Care | Payer: Medicare Other | Source: Ambulatory Visit | Attending: Cardiology | Admitting: Cardiology

## 2019-02-04 ENCOUNTER — Encounter (HOSPITAL_COMMUNITY): Payer: Self-pay | Admitting: *Deleted

## 2019-02-04 ENCOUNTER — Other Ambulatory Visit: Payer: Self-pay

## 2019-02-04 VITALS — BP 118/80 | HR 90 | Wt 192.2 lb

## 2019-02-04 DIAGNOSIS — J449 Chronic obstructive pulmonary disease, unspecified: Secondary | ICD-10-CM | POA: Insufficient documentation

## 2019-02-04 DIAGNOSIS — Z7901 Long term (current) use of anticoagulants: Secondary | ICD-10-CM | POA: Diagnosis not present

## 2019-02-04 DIAGNOSIS — Z87891 Personal history of nicotine dependence: Secondary | ICD-10-CM | POA: Insufficient documentation

## 2019-02-04 DIAGNOSIS — G4733 Obstructive sleep apnea (adult) (pediatric): Secondary | ICD-10-CM | POA: Insufficient documentation

## 2019-02-04 DIAGNOSIS — I484 Atypical atrial flutter: Secondary | ICD-10-CM | POA: Insufficient documentation

## 2019-02-04 DIAGNOSIS — I270 Primary pulmonary hypertension: Secondary | ICD-10-CM

## 2019-02-04 DIAGNOSIS — M109 Gout, unspecified: Secondary | ICD-10-CM | POA: Insufficient documentation

## 2019-02-04 DIAGNOSIS — E785 Hyperlipidemia, unspecified: Secondary | ICD-10-CM | POA: Diagnosis not present

## 2019-02-04 DIAGNOSIS — K219 Gastro-esophageal reflux disease without esophagitis: Secondary | ICD-10-CM | POA: Insufficient documentation

## 2019-02-04 DIAGNOSIS — E039 Hypothyroidism, unspecified: Secondary | ICD-10-CM | POA: Diagnosis not present

## 2019-02-04 DIAGNOSIS — I2510 Atherosclerotic heart disease of native coronary artery without angina pectoris: Secondary | ICD-10-CM | POA: Diagnosis not present

## 2019-02-04 DIAGNOSIS — I5032 Chronic diastolic (congestive) heart failure: Secondary | ICD-10-CM | POA: Diagnosis not present

## 2019-02-04 DIAGNOSIS — R0602 Shortness of breath: Secondary | ICD-10-CM | POA: Insufficient documentation

## 2019-02-04 DIAGNOSIS — I4819 Other persistent atrial fibrillation: Secondary | ICD-10-CM

## 2019-02-04 DIAGNOSIS — Z79899 Other long term (current) drug therapy: Secondary | ICD-10-CM | POA: Diagnosis not present

## 2019-02-04 DIAGNOSIS — I2721 Secondary pulmonary arterial hypertension: Secondary | ICD-10-CM | POA: Diagnosis not present

## 2019-02-04 DIAGNOSIS — J9611 Chronic respiratory failure with hypoxia: Secondary | ICD-10-CM

## 2019-02-04 DIAGNOSIS — I422 Other hypertrophic cardiomyopathy: Secondary | ICD-10-CM | POA: Insufficient documentation

## 2019-02-04 DIAGNOSIS — I480 Paroxysmal atrial fibrillation: Secondary | ICD-10-CM | POA: Insufficient documentation

## 2019-02-04 LAB — BASIC METABOLIC PANEL
Anion gap: 9 (ref 5–15)
BUN: 14 mg/dL (ref 8–23)
CO2: 23 mmol/L (ref 22–32)
Calcium: 8.9 mg/dL (ref 8.9–10.3)
Chloride: 106 mmol/L (ref 98–111)
Creatinine, Ser: 1.01 mg/dL — ABNORMAL HIGH (ref 0.44–1.00)
GFR calc Af Amer: >60 mL/min (ref >60)
GFR calc non Af Amer: 55 mL/min — ABNORMAL LOW (ref >60)
Glucose, Bld: 83 mg/dL (ref 70–99)
Potassium: 4.3 mmol/L (ref 3.5–5.1)
Sodium: 138 mmol/L (ref 135–145)

## 2019-02-04 NOTE — Progress Notes (Addendum)
Received referral from Dr. Aundra Dubin for this pt to participate in pulmonary rehab with the diagnosis of Diastolic heart failure, Pulmonary Hypertension and chronic respiratory failure with hypoxia. Pt with covid risk score 8.  Pt seen on 10/30 in the heart failure clinic.  Review of medical history shows AV nodal Ablation and PPM placed on 10/26 pt discharged after overnight hospital stay. Pt will need at least 4 week hold on participating in upper body exercise due to insertion of PPM.  Pt has the upcoming appointments - 11/5 wound check and on 11/24 follow up with EP- Dr. Lovena Le.  After that appointment has been completed will reassess readiness to proceed.Cherre Huger, BSN Cardiac and Training and development officer

## 2019-02-04 NOTE — Patient Instructions (Signed)
Labs done today. We will contact you only if your labs are abnormal.  STOP Metoprolol   Please continue all other medications as prescribed.  Your physician recommends that you schedule a follow-up appointment in: 3 months  You have been referred to Pulmonary Rehab. They will contact you about scheduling an appointment.  At the [facility], you and your health needs are our priority. As part of our continuing mission to provide you with exceptional heart care, we have created designated Provider Care Teams. These Care Teams include your primary Cardiologist (physician) and Advanced Practice Providers (APPs- Physician Assistants and Nurse Practitioners) who all work together to provide you with the care you need, when you need it.   You may see any of the following providers on your designated Care Team at your next follow up: Marland Kitchen Dr Glori Bickers . Dr [name] . Darrick Grinder, NP . Lyda Jester, PA   Please be sure to bring in all your medications bottles to every appointment.

## 2019-02-06 NOTE — Progress Notes (Signed)
Date:  02/06/2019   ID:  Amy [name], DOB 06-Jun-1945, MRN [id number]   Provider location: [facility] Type of Visit: Established patient   PCP:  Aretta Nip, MD  Cardiologist: Dr. Aundra Dubin  Chief Complaint: Shortness of breath.    History of Present Illness: Amy [name] is a 73 y.o. female with a history of COPD, OSA on CPAP, apical hypertrophic cardiomyopathy, pulmonary hypertension, chronic diastolic CHF, and paroxysmal atrial fibrillation.  She was admitted in 3/18 with CHF and dyspnea, found to have pulmonary arterial hypertension by RHC.  Prior to this, she has had a long history of PAF.  She is currently taking dronedarone but has had breakthrough fibrillation. She had an atrial fibrillation ablation in the past. She has apical hypertrophic cardiomyopathy proven by cardiac MRI with a consistent ECG.   She is on Opsumit and has started Selexipag, now titrated up to 1600 mcg bid. She was unable to tolerate Adcirca. Symptomatically, she has not seen significant improvement with selective pulmonary vasodilators to treat PAH.     I repeated her RHC in 3/19.  This showed lower PA pressure and normal right and left heart filling pressures, but more worrisomely, cardiac output was low.  However, on repeat of echo in 3/19, the RV actually looked normal in size and systolic function.  Given concern for possible PVOD as cause of her worsening symptoms on treatment, I ordered a high resolution CT chest.  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  I had her see Dr Lake Bells. He sent autoimmune serologies again, weak P-ANCA positivity so she was referred to rheumatology.  Echo bubble study was negative.  CCP and Ro-52 were positive.  Also of note, she has had atrial fibrillation ablation and pulmonary vein stenosis is a consideration.  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  She was started on prednisone and Cellcept.  Cellcept was stopped after GI bleed.  She was seen by rheumatology at [facility] and started on azathioprine.   In 9/19, she was admitted with a GI bleed.  Colonoscopy did not show a definite cause.  She was started back on Eliquis.   She was unable to tolerate Adempas so it was stopped (lightheadedness).    In 5/20, she was in the ER with atypical atrial flutter.  She was cardioverted and discharged home.   Echo in 7/20 showed EF 65-70% with an apical HCM pattern, normal RV size and systolic function, PASP 39 mmHg, normal IVC.   She was cardioverted again in 10/20 for atrial flutter with persistent rate in 120s.  She converted to NSR but it did not hold.  She saw Dr. Rayann Heman and decision was made to have her undergo AV nodal ablation with placement of [employer] PPM in 10/20.    She returns today for followup of CHF and pulmonary hypertension. She is now paced s/p her AV nodal ablation. She feels better with controlled HR.  She remains dyspneic with moderate exertion, uses a walker.  She is not walking as much as normal as she has had a gout flare in the right ankle.  No orthopnea/PND.   Labs (2/18): TSH normal Labs (3/18): K 3.8, creatinine 1.26, BNP 424 Labs (5/18): K 4.4, creatinine 1.96, anti-SCL 70 negative, RF very slightly elevated, HIV negative.  Labs (6/18): K 4, creatinine 1.2,  LDL 49 Labs (9/18): BNP 149, K 4, creatinine 1.4, BNP 149 Labs (2/19): BNP 126, K 3.8, creatinine 1.5, CCP+, Ro-52+, elevated CK.  Labs (7/19): BNP 676, K 4.7, creatinine 1.74 Labs (9/19): K 3.8, creatinine 1.39, BNP 220 Labs (11/19): K 3.5, creatinine 1.37, BNP 134 Labs (1/20): K 4.1, creatinine 1.18 Labs (4/20): K 4, creatinine 1.27, LFTs normal, hgb 11.7, plts 237 Labs (6/20): K 4.2, creatinine 1.16, hgb 11.6 Labs (10/20): K 4.8,  creatinine 1.55, hgb 11.3  4/18 6 minute walk: 109 m  10/18 6 minute walk: 229 m 2/19 6 minute walk: < 100 m 5/19 6 minute walk: 107 m 1/20 6 minute walk: 188 m 7/20 6 minute walk: 171 m  PMH:  1. COPD: Quit smoking 2011.  - PFTs (3/18): minimal obstruction, minimal restriction, DLCO 22% (severely decreased).  2. Hypothyroidism 3. Hyperlipidemia 4. OSA: Uses CPAP 5. Apical hypertrophic cardiomyopathy:  - Cardiac MRI (7/16) with EF 79%, mid-apical LV hypertrophy with spade-like ventricle, mid-myocardial LGE was noted at the apex.   6. GERD 7. Atrial fibrillation and flutter: Paroxysmal.  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 Long QT interval so dofetilide and sotalol have not been used. She has had atrial fibrillation ablation.  - Currently on dronedarone.  - DCCV to NSR in ER 7/19.  - DCCV to NSR in 10/20 but did not hold, back in atypical flutter with rate in 120s.  - AV nodal ablation with MDT PPM placed in 10/20.  8. Coronary artery disease: LHC (7/16) with nonobstructive disease + 85% ostial stenosis small D1.  9. Diastolic CHF: Echo (2/45) with EF 65-70%, apical hypertrophic cardiomyopathy, severe LAE, PASP 86 mmHg.  - RHC (3/18): mean RA 4, PA 69/23 mean 38, mean PCWP 8, CI 2.17, PVR 7.3 WU Fick, 9.6 WU thermo.  - Echo (3/19): EF 65-70%, apical hypertrophic cardiomyopathy, normal RV size and systolic function, severe LAE.  - Echo (6/19): EF 65-70%, apical HCM, restrictive diastolic dysfunction, negative bubble study, PASP 42 mmHg.  10. Pulmonary hypertension: See RHC above.  - V/Q scan negative 3/18 - HIV, SCL70 negative.  RF borderline elevated, doubt significant.  ANA negative.  - Unable to tolerate Adcirca - RHC (3/19): mean RA 6, PA 49/16 mean 29, mean PCWP 10, CI 1.9/PVR 5.3 WU Fick, CI 1.4/PVR 7.3 Thermo.  - High resolution CT chest (3/19): respiratory motion artifact present, but suspect mild emphysema with lungs grossly clear.  - P-ANCA+, CCP+, RO-52+, CK elevated.  -  Echo 6/19 with negative bubble study.  - Unable to tolerate Adempas due to hypotension.  - Echo (7/20): EF 65-70% with an apical HCM pattern, normal RV size and systolic function, PASP 39 mmHg, normal IVC.  11. CPX (12/17): peak VO2 8.5, RER 0.96, VE/VCO2 slope 67. Submaximal but appears to show severe functional impairment.   12. CKD: Stage 3.  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  14. Gout  Current Outpatient Medications  Medication Sig Dispense Refill   acetaminophen (TYLENOL) 325 MG tablet Take 2 tablets (650 mg total) by mouth every 4 (four) hours as needed for headache or mild pain.     apixaban (ELIQUIS) 5 MG TABS tablet Take 1 tablet (5 mg total) by mouth 2 (two) times daily. Restart 02/04/19 60 tablet 3   atorvastatin (LIPITOR) 80 MG tablet Take 1 tablet (80 mg total) by mouth daily with lunch. (Patient taking differently: Take 80 mg by mouth daily. ) 90 tablet 3  azaTHIOprine (IMURAN) 50 MG tablet Take 100 mg by mouth at bedtime.      Cholecalciferol (VITAMIN D) 2000 UNITS CAPS Take 2,000 Units by mouth daily at 12 noon.      COLCRYS 0.6 MG tablet TAKE 1 TABLET BY MOUTH DAILY (Patient taking differently: Take 0.6 mg by mouth daily. ) 30 tablet 0   colestipol (COLESTID) 1 g tablet Take 2 g by mouth at bedtime as needed (digestive issues.).      ezetimibe (ZETIA) 10 MG tablet Take 10 mg by mouth daily.     folic acid (FOLVITE) 1 MG tablet Take 1 mg by mouth daily in the afternoon. In the afternoon.     furosemide (LASIX) 20 MG tablet Take 20 mg by mouth daily at 12 noon.      HYDROcodone-acetaminophen (NORCO/VICODIN) 5-325 MG tablet Take 1 tablet by mouth every 6 (six) hours as needed for moderate pain.     levothyroxine (SYNTHROID) 100 MCG tablet Take 100 mcg by mouth daily.      macitentan (OPSUMIT) 10 MG tablet Take 1 tablet (10 mg total) by mouth daily. 30 tablet 11   Multiple  Vitamin (MULTIVITAMIN) capsule Take 1 capsule by mouth every evening.      mupirocin nasal ointment (BACTROBAN) 2 % Place 1 application into the nose daily as needed (nose sores).     neomycin-bacitracin-polymyxin (NEOSPORIN) ointment Apply 1 application topically as needed for wound care.     ondansetron (ZOFRAN-ODT) 4 MG disintegrating tablet Take 1 tablet (4 mg total) by mouth every 8 (eight) hours as needed for nausea or vomiting. 20 tablet 0   OVER THE COUNTER MEDICATION Take 1 tablet by mouth 2 (two) times daily as needed (cold symptoms). Counter-act otc cold med     pantoprazole (PROTONIX) 40 MG tablet TAKE 1 TABLET BY MOUTH EVERY DAY (Patient taking differently: Take 40 mg by mouth daily before breakfast. ) 90 tablet 2   potassium chloride SA (K-DUR) 20 MEQ tablet Take 10 mEq by mouth daily.      UNABLE TO FIND Med Name: CPAP  DME-[facility]     UPTRAVI 1600 MCG TABS TAKE 1 TABLET TWO TIMES A DAY (Patient taking differently: Take 1,600 mcg by mouth 2 (two) times daily. ) 60 tablet 1   vitamin B-12 (CYANOCOBALAMIN) 1000 MCG tablet Take 1,000 mcg by mouth daily in the afternoon.      No current facility-administered medications for this encounter.     Allergies:   Amiodarone, Cellcept [mycophenolate mofetil], and Zyban [bupropion]   Social History:  The patient  reports that she quit smoking about 9 years ago. Her smoking use included cigarettes. She has a 22.00 pack-year smoking history. She has never used smokeless tobacco. She reports that she does not drink alcohol or use drugs.   Family History:  The patient's family history includes Breast cancer in her paternal aunt; Gout in her father; Heart disease in her sister; Lung cancer in her father.   ROS:  Please see the history of present illness.   All other systems are personally reviewed and negative.   Exam:   BP 118/80    Pulse 90    Wt 87.2 kg (192 lb 3.2 oz)    SpO2 95% Comment: 4L of oxygen   BMI 34.05 kg/m  General:  NAD Neck: No JVD, no thyromegaly or thyroid nodule.  Lungs: Mildly decreased BS bilaterally.  CV: Nondisplaced PMI.  Heart regular S1/S2, no S3/S4, no murmur.  Trace ankle  edema.  No carotid bruit.  Normal pedal pulses.  Abdomen: Soft, nontender, no hepatosplenomegaly, no distention.  Skin: Intact without lesions or rashes.  Neurologic: Alert and oriented x 3.  Psych: Normal affect. Extremities: No clubbing or cyanosis.  HEENT: Normal.    Recent Labs: 09/15/2018: ALT 19 10/14/2018: B Natriuretic Peptide 188.3 01/25/2019: Hemoglobin 11.3; Platelets 303 02/04/2019: BUN 14; Creatinine, Ser 1.01; Potassium 4.3; Sodium 138  Personally reviewed   Wt Readings from Last 3 Encounters:  02/04/19 87.2 kg (192 lb 3.2 oz)  02/01/19 87.5 kg (192 lb 12.8 oz)  01/25/19 92.5 kg (204 lb)      ASSESSMENT AND PLAN:  1. Chronic diastolic CHF: Echo in 1/61 showed EF 65-70% in apical HCM pattern, normal RV size and systolic function.  NYHA class III symptoms, but suspect much of this is due to lung disease.  - Continue Lasix 20 mg daily. BMET today.  2. Apical hypertrophic cardiomyopathy: Cardiac MRI in 7/16 showed mid-apical LV hypertrophy with vigorous contraction.  ECG is consistent with apical HCM.   3. Atrial fibrillation/atypical atrial flutter:  Per Dr. Rayann Heman, not candidate for redo ablation. Thought to have had amiodarone toxicity and QTc too long for dofetilide or sotalol. Breakthrough atrial fibrillation in 7/19, DCCV back to NSR in ER.  Breakthrough atypical atrial flutter in 5/20, DCCV back to NSR in the ER.  Breakthrough atypical atrial flutter with RVR in 10/20, DCCV to NSR but did not hold NSR.  In 10/20, she had AV nodal ablation with MDT PPM placed.  - Continue Eliquis.  No further overt GI bleeding.    - She can stop Toprol XL given COPD, rate controlled now by AV nodal ablation.  4. Pulmonary hypertension: RHC in 3/18 showed moderate pulmonary arterial hypertension with elevated PVR (7.3  WU by Fick, 9.6 WU by thermo). She has COPD but I do not think this plays a large role in the pulmonary hypertension =>  PFTs in 3/18 showed minimal obstruction and restriction, there was significantly decreased DLCO, this may be due to pulmonary vascular disease.  OSA may play a role but not a large one. V/Q scan did not suggest chronic PEs in 3/18.  RF borderline elevated (probably not significant), HIV negative, Anti-SCL70 negative, ANA negative, P-ANCA weakly positive.  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  Her RHC in 3/19 showed lower PA pressure and PVR with normal right and left heart filling pressures, but cardiac output was lower than prior.  Surprisingly, her RV looked normal on echo so I question how accurate the cardiac output measurement really was.  Bubble study negative on echo.  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  Echo in 7/20 showed normal RV size and systolic function and PASP 39 mmHg.  - Also of note, she has a history of afib ablation so pulmonary vein stenosis certainly remains a concern for raising PA pressure, but I think this is unlikely as the cause for her presentation.   - Continue Opsumit.     - She did not tolerate Adcirca or Adempas.   - Continue Selexipag 1600 bid.  - I will refer her for pulmonary rehab.  5. Anti-synthetase syndrome: Elevated CK, positive myositis  panel, nonspecific interstitial pneumonia. Being seen by rheumatology at [facility], on azathioprine now.    Recommended follow-up:  3 months   Signed, [name], MD  02/06/2019  [facility] [place] [address] [facility] [place]  [id number] [phone] (office) [phone] (fax)

## 2019-02-09 NOTE — Progress Notes (Signed)
Cardiology Office Note Date:  02/10/2019  Patient ID:  Amy, [name] [birth year], MRN [id number] PCP:  Aretta Nip, MD  Cardiologist:  Dr. Aundra Dubin Electrophysiologist: Dr. Lovena Le    Chief Complaint: wound check  History of Present Illness: Breunna [name] is a 73 y.o. 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  Comes in a wheelchair but reports she does ambulate short distance at home ok.   She is chronically on O2 and is at her baseline breathing.  She had one day that she was sitting and felt weak, no near syncope or syncope, but very weak, this lasted a few minutes, and resolved.  She has had issues with low BP's historically, since being off some of the medicines now though has come up some.  No CP  She had no concerns/issues with her pacer site or groin.  She is back in her Eliquis, no bleeding or signs of bleeding  Dr. Lake Bells is her pulmonologist though while he is managing the [place] [facility] they tell her she will have anew pulmonologist assigned to her but does not who that is.   AFib hx PVI ablation 04/24/2015 May 2020 atypical aflutter >> DCCV Oct Atypical flutter >> DCCV AV node ablation >> PPM AAD Multaq Amiodarone > ? Toxicity  Device information: MDT dual chamber PPM, implanted 01/31/2019 >> AV node ablation Pt was was quite lightheaded today with loss of capture    Past Medical History:  Diagnosis Date  . 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 Apical variant hypertrophic cardiomyopathy ([place])    Diagnosed by echo and ECG 11/13  . Atypical atrial flutter ([place])   . CAD  (coronary artery disease)    a. LHC in 10/2011 demonstrated non-obstructive disease and an 80% lesion in a small D1 treated medically.   . CHF (congestive heart failure) ([place])   . Chronic diastolic heart failure ([place])   . CKD (chronic kidney disease), stage III   . COPD (chronic obstructive pulmonary disease) ([place])   . Dizziness    had PT for being off balance - in approx. 2012  . GERD (gastroesophageal reflux disease)   . Hyperlipidemia   . Hypertension   . Hypothyroid   . Lumbar disc disease   . Macular degeneration   . OSA (obstructive sleep apnea)   . Persistent atrial fibrillation ([place])       . Pulmonary hypertension ([place])    a. [place] 05/2013 showed mild PAH with normal PCWP and RA pressure, could be related to OSA and low oxygen saturation.   . Refusal of blood transfusions as patient is Jehovah's Witness   . Sinus bradycardia     Past Surgical History:  Procedure Laterality Date  . AV NODE ABLATION N/A 01/31/2019   Procedure: AV NODE ABLATION;  Surgeon: Evans Lance, MD;  Location: [facility];  Service: Cardiovascular;  Laterality: N/A;  . BIOPSY  12/03/2017   Procedure: BIOPSY;  Surgeon: Otis Brace, MD;  Location: [facility];  Service: Gastroenterology;;  . CARDIAC CATHETERIZATION     normal coronary arteries  . CARDIAC CATHETERIZATION N/A 10/31/2014   Procedure: Left Heart Cath and Coronary Angiography;  Surgeon: Leonie Man, MD;  Location: [place] [facility];  Service: Cardiovascular;  Laterality: N/A;  . CARDIOVERSION N/A 01/11/2019   Procedure: CARDIOVERSION;  Surgeon: Larey Dresser, MD;  Location: [facility] ENDOSCOPY;  Service: Cardiovascular;  Laterality: N/A;  . CATARACT EXTRACTION Bilateral   . [place]  . COLONOSCOPY WITH PROPOFOL N/A 12/03/2017   Procedure: COLONOSCOPY WITH PROPOFOL;  Surgeon: Otis Brace, MD;  Location: [facility];  Service: Gastroenterology;  Laterality: N/A;  . ELECTROPHYSIOLOGIC STUDY N/A 04/24/2015    Procedure: Atrial Fibrillation Ablation;  Surgeon: [name], MD;  Location: [place] [facility];  Service: Cardiovascular;  Laterality: N/A;  . FLEXIBLE SIGMOIDOSCOPY N/A 11/30/2017   Procedure: FLEXIBLE SIGMOIDOSCOPY;  Surgeon: Otis Brace, MD;  Location: [place];  Service: Gastroenterology;  Laterality: N/A;  . PACEMAKER IMPLANT N/A 01/31/2019   Procedure: PACEMAKER IMPLANT;  Surgeon: Evans Lance, MD;  Location: [place] [facility];  Service: Cardiovascular;  Laterality: N/A;  . RIGHT HEART CATH N/A 06/25/2016   Procedure: Right Heart Cath;  Surgeon: Sherren Mocha, MD;  Location: [place] [facility];  Service: Cardiovascular;  Laterality: N/A;  . RIGHT HEART CATH N/A 06/10/2017   Procedure: RIGHT HEART CATH;  Surgeon: Larey Dresser, MD;  Location: [place] [facility];  Service: Cardiovascular;  Laterality: N/A;  . RIGHT HEART CATHETERIZATION N/A 06/01/2013   Procedure: RIGHT HEART CATH;  Surgeon: Larey Dresser, MD;  Location: [facility] CATH LAB;  Service: Cardiovascular;  Laterality: N/A;  . TEE WITHOUT CARDIOVERSION N/A 04/23/2015   Procedure: TRANSESOPHAGEAL ECHOCARDIOGRAM (TEE);  Surgeon: Josue Hector, MD;  Location: [facility] ENDOSCOPY;  Service: Cardiovascular;  Laterality: N/A;    Current Outpatient Medications  Medication Sig Dispense Refill  . acetaminophen (TYLENOL) 325 MG tablet Take 2 tablets (650 mg total) by mouth every 4 (four) hours as needed for headache or mild pain.    Marland Kitchen apixaban (ELIQUIS) 5 MG TABS tablet Take 1 tablet (5 mg total) by mouth 2 (two) times daily. Restart 02/04/19 60 tablet 3  . atorvastatin (LIPITOR) 80 MG tablet Take 1 tablet (80 mg total) by mouth daily with lunch. (Patient taking differently: Take 80 mg by mouth daily. ) 90 tablet 3  . azaTHIOprine (IMURAN) 50 MG tablet Take 100 mg by mouth at bedtime.     . Cholecalciferol (VITAMIN D) 2000 UNITS CAPS Take 2,000 Units by mouth daily at 12 noon.     Marland Kitchen COLCRYS 0.6 MG tablet TAKE 1 TABLET BY MOUTH DAILY  (Patient taking differently: Take 0.6 mg by mouth daily. ) 30 tablet 0  . colestipol (COLESTID) 1 g tablet Take 2 g by mouth at bedtime as needed (digestive issues.).     Marland Kitchen ezetimibe (ZETIA) 10 MG tablet Take 10 mg by mouth daily.    . folic acid (FOLVITE) 1 MG tablet Take 1 mg by mouth daily in the afternoon. In the afternoon.    . furosemide (LASIX) 20 MG tablet Take 20 mg by mouth daily at 12 noon.     Marland Kitchen HYDROcodone-acetaminophen (NORCO/VICODIN) 5-325 MG tablet Take 1 tablet by mouth every 6 (six) hours as needed for moderate pain.    Marland Kitchen levothyroxine (SYNTHROID) 100 MCG tablet Take 100 mcg by mouth daily.     . macitentan (OPSUMIT) 10 MG tablet Take 1 tablet (10 mg total) by mouth daily. 30 tablet 11  . Multiple Vitamin (MULTIVITAMIN) capsule Take 1 capsule by mouth every evening.     . mupirocin nasal  ointment (BACTROBAN) 2 % Place 1 application into the nose daily as needed (nose sores).    . neomycin-bacitracin-polymyxin (NEOSPORIN) ointment Apply 1 application topically as needed for wound care.    . ondansetron (ZOFRAN-ODT) 4 MG disintegrating tablet Take 1 tablet (4 mg total) by mouth every 8 (eight) hours as needed for nausea or vomiting. 20 tablet 0  . OVER THE COUNTER MEDICATION Take 1 tablet by mouth 2 (two) times daily as needed (cold symptoms). Counter-act otc cold med    . pantoprazole (PROTONIX) 40 MG tablet TAKE 1 TABLET BY MOUTH EVERY DAY (Patient taking differently: Take 40 mg by mouth daily before breakfast. ) 90 tablet 2  . potassium chloride SA (K-DUR) 20 MEQ tablet Take 10 mEq by mouth daily.     Marland Kitchen UNABLE TO FIND Med Name: CPAP  D[facility]    . UPTRAVI 1600 MCG TABS TAKE 1 TABLET TWO TIMES A DAY (Patient taking differently: Take 1,600 mcg by mouth 2 (two) times daily. ) 60 tablet 1  . vitamin B-12 (CYANOCOBALAMIN) 1000 MCG tablet Take 1,000 mcg by mouth daily in the afternoon.      No current facility-administered medications for this visit.     Allergies:   Amiodarone,  Cellcept [mycophenolate mofetil], and Zyban [bupropion]   Social History:  The patient  reports that she quit smoking about 9 years ago. Her smoking use included cigarettes. She has a 22.00 pack-year smoking history. She has never used smokeless tobacco. She reports that she does not drink alcohol or use drugs.   Family History:  The patient's family history includes Breast cancer in her paternal aunt; Gout in her father; Heart disease in her sister; Lung cancer in her father.  ROS:  Please see the history of present illness.  All other systems are reviewed and otherwise negative.   PHYSICAL EXAM:  VS:  BP (!) 110/58   Pulse 90   Ht _0  (1.6 m)   Wt 192 lb (87.1 kg)   BMI 34.01 kg/m  BMI: Body mass index is 34.01 kg/m. Well nourished, well developed, in no acute distress  HEENT: normocephalic, atraumatic  Neck: no JVD, carotid bruits or masses Cardiac:  RRR; no significant murmurs, no rubs, or gallops Lungs:  CTA b/l, no wheezing, rhonchi or rales  Abd: soft, nontender, obese MS: no deformity or atrophy Ext:  Trace if any edema  Skin: warm and dry, no rash Neuro:  No gross deficits appreciated Psych: euthymic mood, full affect  PPM site: steri strips are removed without difficulty, has a 64m skin tear inferior to the incision, no bleeding, skin edges are well approximated, no bleeding, drainage, no erythema, edema or fluctuation.  No increased heat to the surrounding tissues   EKG:  Not done today PPM interrogation done today and reviewed by myself;  Battery and lead measurements are good Pacing rate reduced to 80bpm today   10/14/2018: TTE IMPRESSIONS  1. The left ventricle has hyperdynamic systolic function, with an ejection fraction of >65%. There is asymmetric left ventricular hypertrophy with severe hypertrophy of the LV apex consistent with apical hypertrophic cardiomyopathy. Left ventricular  diastolic Doppler parameters are consistent with pseudonormalization. No  evidence of left ventricular regional wall motion abnormalities.  2. The right ventricle has normal systolic function. The cavity was normal. There is no increase in right ventricular wall thickness.  3. Left atrial size was severely dilated.  4. There is mild mitral annular calcification present. No evidence of mitral valve stenosis.  Trivial mitral regurgitation.  5. The aortic valve is tricuspid. Mild calcification of the aortic valve. No stenosis of the aortic valve.  6. The aortic root is normal in size and structure.  7. Normal IVC size. PA systolic pressure 39 mmHg.   06/10/17: RHC 1. Mild to moderate pulmonary arterial hypertension, absolute numbers are lower than on RHC in 3/18.  PVR remains elevated.  2. Low cardiac output.  CO was low on 3/18 cath but today's numbers appear worse.  3. Normal filling pressures.   Is cardiac output low solely due to pulmonary hypertension? Surprisingly RA pressure is not elevated.   - Will get echo today.  - Continue titration of selexipag upwards. ?Consider IV Flolan.  - She is going to see pulmonary in a couple of weeks.      Recent Labs: 09/15/2018: ALT 19 10/14/2018: B Natriuretic Peptide 188.3 01/25/2019: Hemoglobin 11.3; Platelets 303 02/04/2019: BUN 14; Creatinine, Ser 1.01; Potassium 4.3; Sodium 138  No results found for requested labs within last 8760 hours.   Estimated Creatinine Clearance: 51.9 mL/min (A) (by C-G formula based on SCr of 1.01 mg/dL (H)).   Wt Readings from Last 3 Encounters:  02/10/19 192 lb (87.1 kg)  02/04/19 192 lb 3.2 oz (87.2 kg)  02/01/19 192 lb 12.8 oz (87.5 kg)     Other studies reviewed: Additional studies/records reviewed today include: summarized above  ASSESSMENT AND PLAN:  1. PPM     Site is stable, no signs of infection     Discussed remaining LUE activity restrictions, wound management going forward     She has her APP up on her phone, discussed not to close it all the way down/remove it  2.  Paroxysmal >> persistant atypical AFlutter >> s/p AVNode ablation >> permanent     CHA2DS2Vasc is 2, on Eliquis, appropriately dosed     She has an appointment in a couple weeks for further reduction of her pacing rate   Disposition: F/u with [name] as scheduled, sooner if needed  Current medicines are reviewed at length with the patient today.  The patient did not have any concerns regarding medicines.  Venetia Night, PA-C 02/10/2019 11:47 AM     [facility] [address] [place] [address] [phone] (office)  [phone] (fax)

## 2019-02-10 ENCOUNTER — Other Ambulatory Visit: Payer: Self-pay

## 2019-02-10 ENCOUNTER — Ambulatory Visit (INDEPENDENT_AMBULATORY_CARE_PROVIDER_SITE_OTHER): Payer: Medicare Other | Admitting: Physician Assistant

## 2019-02-10 VITALS — BP 110/58 | HR 90 | Ht 63.0 in | Wt 192.0 lb

## 2019-02-10 DIAGNOSIS — Z4889 Encounter for other specified surgical aftercare: Secondary | ICD-10-CM

## 2019-02-10 DIAGNOSIS — Z950 Presence of cardiac pacemaker: Secondary | ICD-10-CM | POA: Diagnosis not present

## 2019-02-10 DIAGNOSIS — I484 Atypical atrial flutter: Secondary | ICD-10-CM | POA: Diagnosis not present

## 2019-02-10 NOTE — Patient Instructions (Signed)
Medication Instructions:  Your physician recommends that you continue on your current medications as directed. Please refer to the Current Medication list given to you today.  *If you need a refill on your cardiac medications before your next appointment, please call your pharmacy*  Lab Work: [place]   If you have labs (blood work) drawn today and your tests are completely normal, you will receive your results only by: Marland Kitchen MyChart Message (if you have MyChart) OR . A paper copy in the mail If you have any lab test that is abnormal or we need to change your treatment, we will call you to review the results.  Testing/Procedures: NONE ORDERED  TODAY   Follow-Up: At [facility], you and your health needs are our priority.  As part of our continuing mission to provide you with exceptional heart care, we have created designated Provider Care Teams.  These Care Teams include your primary Cardiologist (physician) and Advanced Practice Providers (APPs -  Physician Assistants and Nurse Practitioners) who all work together to provide you with the care you need, when you need it.  Your next appointment:  AS SCHEDULED   Other Instructions

## 2019-02-24 ENCOUNTER — Telehealth (HOSPITAL_COMMUNITY): Payer: Self-pay

## 2019-02-24 ENCOUNTER — Telehealth: Payer: Self-pay | Admitting: Internal Medicine

## 2019-02-24 NOTE — Telephone Encounter (Signed)
Patient called to state she will need someone to come with her to her appt on 11/24 as she is in a wheelchair.

## 2019-02-24 NOTE — Telephone Encounter (Signed)
Pt insurance is active and benefits verified through Medicare A/B. Co-pay $0.00, DED $198.00/$198.00 met, out of pocket $0.00/$0.00 met, co-insurance 20%. No pre-authorization required.   2ndary insurance is active and benefits verified through [employer]. Co-pay $0.00, DED $100.00/$100.00 met, out of pocket $750.00/$750.00 met, co-insurance 15%. No pre-authorization required. Allison/[employer], 02/24/2019 @ 3:04PM, VEX#[phone]

## 2019-03-01 ENCOUNTER — Other Ambulatory Visit: Payer: Self-pay

## 2019-03-01 ENCOUNTER — Ambulatory Visit (INDEPENDENT_AMBULATORY_CARE_PROVIDER_SITE_OTHER): Payer: Medicare Other | Admitting: Internal Medicine

## 2019-03-01 ENCOUNTER — Encounter: Payer: Self-pay | Admitting: Internal Medicine

## 2019-03-01 VITALS — BP 106/62 | HR 80 | Ht 63.0 in | Wt 185.6 lb

## 2019-03-01 DIAGNOSIS — Z950 Presence of cardiac pacemaker: Secondary | ICD-10-CM | POA: Insufficient documentation

## 2019-03-01 DIAGNOSIS — I442 Atrioventricular block, complete: Secondary | ICD-10-CM

## 2019-03-01 DIAGNOSIS — I4819 Other persistent atrial fibrillation: Secondary | ICD-10-CM

## 2019-03-01 NOTE — Patient Instructions (Addendum)
Medication Instructions:  Your physician recommends that you continue on your current medications as directed. Please refer to the Current Medication list given to you today.  Labwork: None ordered.  Testing/Procedures: None ordered.  Follow-Up: Your physician wants you to follow-up in: 9 months with Dr. Lovena Le.   You will receive a reminder letter in the mail two months in advance. If you don't receive a letter, please call our office to schedule the follow-up appointment.  Remote monitoring is used to monitor your Pacemaker from home. This monitoring reduces the number of office visits required to check your device to one time per year. It allows [name] to keep an eye on the functioning of your device to ensure it is working properly. You are scheduled for a device check from home on 08/03/2019. You may send your transmission at any time that day. If you have a wireless device, the transmission will be sent automatically. After your physician reviews your transmission, you will receive a postcard with your next transmission date.  Any Other Special Instructions Will Be Listed Below (If Applicable).  If you need a refill on your cardiac medications before your next appointment, please call your pharmacy.

## 2019-03-01 NOTE — Progress Notes (Signed)
HPI [name] returns today for followup. She is a pleasant 73 yo woman with a h/o uncontrolled atrial fib, s/p AV node ablation and left bundle pacing. She has done well in the interim. No chest pain. She has COPD and oxygen dependence. No edema.   Allergies  Allergen Reactions  . Amiodarone     Dyspnea - felt to be 2/2 amio lung toxicity Has tolerated Omnipaque   . Cellcept [Mycophenolate Mofetil] Other (See Comments)    Gastric Bleeding  . Zyban [Bupropion] Itching     Current Outpatient Medications  Medication Sig Dispense Refill  . acetaminophen (TYLENOL) 325 MG tablet Take 2 tablets (650 mg total) by mouth every 4 (four) hours as needed for headache or mild pain.    Marland Kitchen apixaban (ELIQUIS) 5 MG TABS tablet Take 1 tablet (5 mg total) by mouth 2 (two) times daily. Restart 02/04/19 60 tablet 3  . atorvastatin (LIPITOR) 80 MG tablet Take 1 tablet (80 mg total) by mouth daily with lunch. (Patient taking differently: Take 80 mg by mouth daily. ) 90 tablet 3  . azaTHIOprine (IMURAN) 50 MG tablet Take 100 mg by mouth at bedtime.     . Cholecalciferol (VITAMIN D) 2000 UNITS CAPS Take 2,000 Units by mouth daily at 12 noon.     Marland Kitchen COLCRYS 0.6 MG tablet TAKE 1 TABLET BY MOUTH DAILY (Patient taking differently: Take 0.6 mg by mouth daily. ) 30 tablet 0  . colestipol (COLESTID) 1 g tablet Take 2 g by mouth at bedtime as needed (digestive issues.).     Marland Kitchen ezetimibe (ZETIA) 10 MG tablet Take 10 mg by mouth daily.    . folic acid (FOLVITE) 1 MG tablet Take 1 mg by mouth daily in the afternoon. In the afternoon.    . furosemide (LASIX) 20 MG tablet Take 20 mg by mouth daily at 12 noon.     Marland Kitchen HYDROcodone-acetaminophen (NORCO/VICODIN) 5-325 MG tablet Take 1 tablet by mouth every 6 (six) hours as needed for moderate pain.    Marland Kitchen levothyroxine (SYNTHROID) 100 MCG tablet Take 100 mcg by mouth daily.     . macitentan (OPSUMIT) 10 MG tablet Take 1 tablet (10 mg total) by mouth daily. 30 tablet 11   . Multiple Vitamin (MULTIVITAMIN) capsule Take 1 capsule by mouth every evening.     . mupirocin nasal ointment (BACTROBAN) 2 % Place 1 application into the nose daily as needed (nose sores).    . neomycin-bacitracin-polymyxin (NEOSPORIN) ointment Apply 1 application topically as needed for wound care.    . ondansetron (ZOFRAN-ODT) 4 MG disintegrating tablet Take 1 tablet (4 mg total) by mouth every 8 (eight) hours as needed for nausea or vomiting. 20 tablet 0  . OVER THE COUNTER MEDICATION Take 1 tablet by mouth 2 (two) times daily as needed (cold symptoms). Counter-act otc cold med    . pantoprazole (PROTONIX) 40 MG tablet TAKE 1 TABLET BY MOUTH EVERY DAY (Patient taking differently: Take 40 mg by mouth daily before breakfast. ) 90 tablet 2  . potassium chloride SA (K-DUR) 20 MEQ tablet Take 10 mEq by mouth daily.     Marland Kitchen UNABLE TO FIND Med Name: CPAP  [facility]    . UPTRAVI 1600 MCG TABS TAKE 1 TABLET TWO TIMES A DAY (Patient taking differently: Take 1,600 mcg by mouth 2 (two) times daily. ) 60 tablet 1  . vitamin B-12 (CYANOCOBALAMIN) 1000 MCG tablet Take 1,000 mcg by mouth daily in the  afternoon.      No current facility-administered medications for this visit.      Past Medical History:  Diagnosis Date  . 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 Apical variant hypertrophic cardiomyopathy ([place])    Diagnosed by echo and ECG 11/13  . Atypical atrial flutter ([place])   . CAD (coronary artery disease)    a. LHC in 10/2011 demonstrated non-obstructive disease and an 80% lesion in a small D1 treated medically.   . CHF (congestive heart failure) ([place])   . Chronic diastolic heart failure ([place])   . CKD (chronic kidney disease), stage III   . COPD (chronic obstructive pulmonary disease) ([place])   . Dizziness    had PT for being off balance - in approx. 2012  . GERD (gastroesophageal reflux disease)   . Hyperlipidemia   . Hypertension   . Hypothyroid   . Lumbar disc disease   . Macular degeneration   .  OSA (obstructive sleep apnea)   . Persistent atrial fibrillation ([place])       . Pulmonary hypertension ([place])    a. [place] 05/2013 showed mild PAH with normal PCWP and RA pressure, could be related to OSA and low oxygen saturation.   . Refusal of blood transfusions as patient is Jehovah's Witness   . Sinus bradycardia     ROS:   All systems reviewed and negative except as noted in the HPI.   Past Surgical History:  Procedure Laterality Date  . AV NODE ABLATION N/A 01/31/2019   Procedure: AV NODE ABLATION;  Surgeon: Evans Lance, MD;  Location: [place] [facility];  Service: Cardiovascular;  Laterality: N/A;  . BIOPSY  12/03/2017   Procedure: BIOPSY;  Surgeon: Otis Brace, MD;  Location: [facility] ENDOS[facility];  Service: Gastroenterology;;  . CARDIAC CATHETERIZATION     normal coronary arteries  . CARDIAC CATHETERIZATION N/A 10/31/2014   Procedure: Left Heart Cath and Coronary Angiography;  Surgeon: Leonie Man, MD;  Location: [place] [facility];  Service: Cardiovascular;  Laterality: N/A;  . CARDIOVERSION N/A 01/11/2019   Procedure: CARDIOVERSION;  Surgeon: Larey Dresser, MD;  Location: [facility] ENDOSCOPY;  Service: Cardiovascular;  Laterality: N/A;  . CATARACT EXTRACTION Bilateral   . [place]  . COLONOSCOPY WITH PROPOFOL N/A 12/03/2017   Procedure: COLONOSCOPY WITH PROPOFOL;  Surgeon: Otis Brace, MD;  Location: [facility] [facility];  Service: Gastroenterology;  Laterality: N/A;  . ELECTROPHYSIOLOGIC STUDY N/A 04/24/2015   Procedure: Atrial Fibrillation Ablation;  Surgeon: [name], MD;  Location: [place] [facility];  Service: Cardiovascular;  Laterality: N/A;  . FLEXIBLE SIGMOIDOSCOPY N/A 11/30/2017   Procedure: FLEXIBLE SIGMOIDOSCOPY;  Surgeon: Otis Brace, MD;  Location: [facility];  Service: Gastroenterology;  Laterality: N/A;  . PACEMAKER IMPLANT N/A 01/31/2019   Procedure: PACEMAKER IMPLANT;  Surgeon: Evans Lance, MD;  Location: [place] [facility];  Service:  Cardiovascular;  Laterality: N/A;  . RIGHT HEART CATH N/A 06/25/2016   Procedure: Right Heart Cath;  Surgeon: Sherren Mocha, MD;  Location: [place] [facility];  Service: Cardiovascular;  Laterality: N/A;  . RIGHT HEART CATH N/A 06/10/2017   Procedure: RIGHT HEART CATH;  Surgeon: Larey Dresser, MD;  Location: [place] [facility];  Service: Cardiovascular;  Laterality: N/A;  . RIGHT HEART CATHETERIZATION N/A 06/01/2013   Procedure: RIGHT HEART CATH;  Surgeon: Larey Dresser, MD;  Location: [facility] C[facility] LAB;  Service: Cardiovascular;  Laterality: N/A;  . TEE WITHOUT CARDIOVERSION N/A 04/23/2015   Procedure: TRANSESOPHAGEAL  ECHOCARDIOGRAM (TEE);  Surgeon: Josue Hector, MD;  Location: [facility] ENDOSCOPY;  Service: Cardiovascular;  Laterality: N/A;     Family History  Problem Relation Age of Onset  . Heart disease Sister        Good Pastures Syndrome  . Gout Father   . Lung cancer Father        smoked  . Breast cancer Paternal Aunt      Social History   Socioeconomic History  . Marital status: Widowed    Spouse name: Not on file  . Number of children: 2  . Years of education: Not on file  . Highest education level: Not on file  Occupational History  . Occupation: retired. still works for [employer].   Social Needs  . Financial resource strain: Not hard at all  . Food insecurity    Worry: Never true    Inability: Never true  . Transportation needs    Medical: No    Non-medical: No  Tobacco Use  . Smoking status: Former Smoker    Packs/day: 0.50    Years: 44.00    Pack years: 22.00    Types: Cigarettes    Quit date: 04/07/2009    Years since quitting: 9.9  . Smokeless tobacco: Never Used  Substance and Sexual Activity  . Alcohol use: No  . Drug use: No  . Sexual activity: Never  Lifestyle  . Physical activity    Days per week: Not on file    Minutes per session: Not on file  . Stress: Not on file  Relationships  . Social Herbalist on phone: Not on file    Gets  together: Not on file    Attends religious service: Not on file    Active member of club or organization: Not on file    Attends meetings of clubs or organizations: Not on file    Relationship status: Not on file  . Intimate partner violence    Fear of current or ex partner: Not on file    Emotionally abused: Not on file    Physically abused: Not on file    Forced sexual activity: Not on file  Other Topics Concern  . Not on file  Social History Narrative   Pt lives alone with 2 dogs.      BP 106/62   Pulse 80   Ht 5' 3" (1.6 m)   Wt 185 lb 9.6 oz (84.2 kg)   SpO2 (!) 86%   BMI 32.88 kg/m   Physical Exam:  Well appearing NAD HEENT: Unremarkable Neck:  No JVD, no thyromegally Lymphatics:  No adenopathy Back:  No CVA tenderness Lungs:  Clear with no wheezes HEART:  Regular rate rhythm, no murmurs, no rubs, no clicks Abd:  soft, positive bowel sounds, no organomegally, no rebound, no guarding Ext:  2 plus pulses, no edema, no cyanosis, no clubbing Skin:  No rashes no nodules Neuro:  CN II through XII intact, motor grossly intact  EKG - atrial flutter with ventricular pacing  DEVICE  Normal device function.  See PaceArt for details.   Assess/Plan: 1. Atrial fib/flutter - her VR is well controlled.  2. PPM - her PM rate has been turned down to 70. Her device is working normally. 3. COPD - her pulse ox is stable but on the low side. She will continue her bronchodilators and oxygen.  Mikle Bosworth.D.

## 2019-03-09 ENCOUNTER — Telehealth (HOSPITAL_COMMUNITY): Payer: Self-pay

## 2019-03-10 DIAGNOSIS — M1A00X0 Idiopathic chronic gout, unspecified site, without tophus (tophi): Secondary | ICD-10-CM | POA: Diagnosis not present

## 2019-03-10 DIAGNOSIS — Z79899 Other long term (current) drug therapy: Secondary | ICD-10-CM | POA: Diagnosis not present

## 2019-03-10 DIAGNOSIS — I270 Primary pulmonary hypertension: Secondary | ICD-10-CM | POA: Diagnosis not present

## 2019-03-10 DIAGNOSIS — Z87891 Personal history of nicotine dependence: Secondary | ICD-10-CM | POA: Diagnosis not present

## 2019-03-10 DIAGNOSIS — E890 Postprocedural hypothyroidism: Secondary | ICD-10-CM | POA: Diagnosis not present

## 2019-03-10 DIAGNOSIS — J849 Interstitial pulmonary disease, unspecified: Secondary | ICD-10-CM | POA: Diagnosis not present

## 2019-03-17 ENCOUNTER — Telehealth (HOSPITAL_COMMUNITY): Payer: Self-pay | Admitting: *Deleted

## 2019-03-17 NOTE — Telephone Encounter (Signed)
Pt called to report that this morning she was not feeling well, she felt very lightheaded and when she checked her BP it was 78/60 (around 11:45).  She states she rechecked it about 11:50 and it was up to 102/77 and at 1:25 it was 96/75, which is where she normally runs.  She states she has been feeling much better now that BP is up.  We discussed meds and she reports that instead of splitting meds up and taking half then eating and taking other half TODAY she took them all at once.  We discussed how this could have decreased BP, she will continue to monitor and let us know if BP is dropping or she feels bad again, she will continue to split meds up as she had been doing.

## 2019-03-21 ENCOUNTER — Ambulatory Visit (HOSPITAL_COMMUNITY): Payer: Medicare Other

## 2019-03-21 ENCOUNTER — Telehealth (HOSPITAL_COMMUNITY): Payer: Self-pay | Admitting: Family Medicine

## 2019-03-24 ENCOUNTER — Telehealth (HOSPITAL_COMMUNITY): Payer: Self-pay | Admitting: *Deleted

## 2019-03-24 ENCOUNTER — Telehealth (HOSPITAL_COMMUNITY): Payer: Self-pay | Admitting: Licensed Clinical Social Worker

## 2019-03-24 ENCOUNTER — Telehealth (HOSPITAL_COMMUNITY): Payer: Self-pay

## 2019-03-24 NOTE — Telephone Encounter (Signed)
Entered in error

## 2019-03-24 NOTE — Telephone Encounter (Signed)
Pt called CSW with concerns about her oxygen provider.  Pt reports that she is planning on moving to [place] in February or March but she doesn't know how she will get the portable oxygen for the 6 hour drive up there.  Pt reports currently getting oxygen through [facility] and that she spoke to them about this process but was told that she would have to purchase the 2 tanks required which would be over $600.  Pt reports that she will be going to [facility] in [place] for her oxygen and that when she calls they identify themselves as part of [facility] so she is confused why she wouldn't be able to take the oxygen tanks with her and just return to the new location in [place].   CSW called [employer] to discuss.  Originally they report not showing the [facility] as part of their service line but state that leadership is looking into it- Tia Masker ext [phone].  CSW will assist following up next week  Jorge Ny, [facility] Desk#: [phone] Cell#: [phone]

## 2019-03-24 NOTE — Telephone Encounter (Signed)
Pt left VM stating she has not been contacted to r/s pulm rehab orientation and that she has left two voice messages and no one has returned her call. Please contact pt.  Message routed to pulm rehab.

## 2019-03-25 ENCOUNTER — Telehealth (HOSPITAL_COMMUNITY): Payer: Self-pay | Admitting: *Deleted

## 2019-03-25 NOTE — Telephone Encounter (Signed)
Called to advise pt that given the surge/increase in Covid-19 cases and hospitalizations our our [facility] has asked [name] to halt onsite Cardiac and Pulmonary rehab exercise sessions and move patients to the Virtual app we have with [place]. The reason for this is so that department staff can be deployed to the Inpatient Nursing floors to assist those teams in need.  Leadership anticipates this need will be 30-60 days however it could be extended if needed.. Advised pt to attend Orientation Assessment  appt scheduled for 12/22 @ 2:15.  At that time She will receive additional information about our [employer]. Pt verbalized understanding and is in agreement. Cherre Huger, BSN Cardiac and Training and development officer

## 2019-03-25 NOTE — Telephone Encounter (Signed)
Called and left message for pt to please return call regarding upcoming appt for pulmonary rehab walk test.  Contact number provided. Cherre Huger, BSN Cardiac and Training and development officer

## 2019-03-29 ENCOUNTER — Other Ambulatory Visit: Payer: Self-pay

## 2019-03-29 ENCOUNTER — Encounter (HOSPITAL_COMMUNITY)
Admission: RE | Admit: 2019-03-29 | Discharge: 2019-03-29 | Disposition: A | Discharge status: Home / Self Care | Payer: Medicare Other | Source: Ambulatory Visit | Attending: Cardiology | Admitting: Cardiology

## 2019-03-29 VITALS — BP 120/70 | HR 76 | Temp 97.2°F | Ht 63.0 in | Wt 191.6 lb

## 2019-03-29 DIAGNOSIS — I272 Pulmonary hypertension, unspecified: Secondary | ICD-10-CM | POA: Diagnosis present

## 2019-03-29 DIAGNOSIS — I5032 Chronic diastolic (congestive) heart failure: Secondary | ICD-10-CM | POA: Insufficient documentation

## 2019-03-29 NOTE — Progress Notes (Signed)
Amy [name] A Amy [name] 73 y.o. female Pulmonary Rehab Orientation Note Patient arrived today in Cardiac and Pulmonary Rehab for orientation to Pulmonary Rehab. She was transported from [employer] via wheel chair. She does carry portable oxygen. Per pt, she uses oxygen continuously. Color good, skin warm and dry. Patient is oriented to time and place. Patient's medical history, psychosocial health, and medications reviewed. Psychosocial assessment reveals pt lives alone. Pt is currently retired. Pt hobbies include learning to play the guitar and spending time with her dog. Pt reports her stress level is low. Areas of stress/anxiety include moving to [place] in the future to be with both her daughters.  Pt does not exhibit signs of depression. Signs of depression include  PHQ2/9 score 0/1. Pt shows good  coping skills with positive outlook .  Will continue to monitor and evaluate progress toward psychosocial goal(s) of continued psychosocial wellness while in pulmonary rehab. Physical assessment reveals heart rate is normal, breath sounds clear to auscultation, no wheezes, rales, or rhonchi. Grip strength equal, strong. Patient reports she does take medications as prescribed. Patient states she follows a Low Sodium diet. The patient reports no specific efforts to gain or lose weight.. Patient's weight will be monitored closely. Demonstration and practice of PLB using pulse oximeter. Patient able to return demonstration satisfactorily. Safety and hand hygiene in the exercise area reviewed with patient. Patient voices understanding of the information reviewed. Department expectations discussed with patient and achievable goals were set. The patient shows enthusiasm about attending the program and we look forward to working with this nice lady  She will exercise in person with [name] 2 times and then will switch over to virtual pulmonary rehab. The patient completed a 6 min walk test on today and to begin exercise on  Tuesday, April 05, 2019.  [id number]

## 2019-03-29 NOTE — Progress Notes (Signed)
Pt is interested in participating in Virtual Cardiac and Pulmonary Rehab. Pt advised that Virtual Cardiac and Pulmonary Rehab is provided at no cost to the patient.  Checklist:  1. Pt has smart device  ie smartphone and/or ipad for downloading an app  Yes 2. Reliable internet/wifi service    Yes 3. Understands how to use their smartphone and navigate within an app.  Yes   Pt verbalized understanding and is in agreement.

## 2019-03-29 NOTE — Progress Notes (Signed)
Confirm Consent - In the setting of the current Covid19 crisis, you are scheduled for a phone visit with your Cardiac or Pulmonary team member.  Just as we do with many in-gym visits, in order for you to participate in this visit, we must obtain consent.  If you'd like, I can send this to your mychart (if signed up) or email for you to review.  Otherwise, I can obtain your verbal consent now.  By agreeing to a telephone visit, we'd like you to understand that the technology does not allow for your Cardiac or Pulmonary Rehab team member to perform a physical assessment, and thus may limit their ability to fully assess your ability to perform exercise programs. If your provider identifies any concerns that need to be evaluated in person, we will make arrangements to do so.  Finally, though the technology is pretty good, we cannot assure that it will always work on either your or our end and we cannot ensure that we have a secure connection.  Cardiac and Pulmonary Rehab Telehealth visits and "At Home" cardiac and pulmonary rehab are provided at no cost to you.        Are you willing to proceed?"        STAFF: Did the patient verbally acknowledge consent to telehealth visit? Document YES/NO here: Yes     Rosebud Poles RN  Cardiac and Pulmonary Rehab Staff        Date 03/28/2021    @ Time 1500

## 2019-03-29 NOTE — Progress Notes (Signed)
Pulmonary Individual Treatment Plan  Patient Details  Name: Amy [name] MRN: [id number] Date of Birth: 1945-12-18 Referring Provider:     Pulmonary Rehab Walk Test from 03/29/2019 in [facility]  Referring Provider  Dr. Aundra Dubin      Initial Encounter Date:    Pulmonary Rehab Walk Test from 03/29/2019 in [facility]  Date  03/29/19      Visit Diagnosis: Heart failure, diastolic, chronic ([place])  Patient's Home Medications on Admission:   Current Outpatient Medications:  .  acetaminophen (TYLENOL) 325 MG tablet, Take 2 tablets (650 mg total) by mouth every 4 (four) hours as needed for headache or mild pain., Disp: , Rfl:  .  apixaban (ELIQUIS) 5 MG TABS tablet, Take 1 tablet (5 mg total) by mouth 2 (two) times daily. Restart 02/04/19, Disp: 60 tablet, Rfl: 3 .  atorvastatin (LIPITOR) 80 MG tablet, Take 1 tablet (80 mg total) by mouth daily with lunch. (Patient taking differently: Take 80 mg by mouth daily. ), Disp: 90 tablet, Rfl: 3 .  azaTHIOprine (IMURAN) 50 MG tablet, Take 100 mg by mouth at bedtime. , Disp: , Rfl:  .  Cholecalciferol (VITAMIN D) 2000 UNITS CAPS, Take 2,000 Units by mouth daily at 12 noon. , Disp: , Rfl:  .  COLCRYS 0.6 MG tablet, TAKE 1 TABLET BY MOUTH DAILY (Patient taking differently: Take 0.6 mg by mouth daily. ), Disp: 30 tablet, Rfl: 0 .  ezetimibe (ZETIA) 10 MG tablet, Take 10 mg by mouth daily., Disp: , Rfl:  .  folic acid (FOLVITE) 1 MG tablet, Take 1 mg by mouth daily in the afternoon. In the afternoon., Disp: , Rfl:  .  furosemide (LASIX) 20 MG tablet, Take 20 mg by mouth daily at 12 noon. , Disp: , Rfl:  .  HYDROcodone-acetaminophen (NORCO/VICODIN) 5-325 MG tablet, Take 1 tablet by mouth every 6 (six) hours as needed for moderate pain., Disp: , Rfl:  .  levothyroxine (SYNTHROID) 100 MCG tablet, Take 100 mcg by mouth daily. , Disp: , Rfl:  .  macitentan (OPSUMIT) 10 MG tablet, Take 1  tablet (10 mg total) by mouth daily., Disp: 30 tablet, Rfl: 11 .  Multiple Vitamin (MULTIVITAMIN) capsule, Take 1 capsule by mouth every evening. , Disp: , Rfl:  .  mupirocin nasal ointment (BACTROBAN) 2 %, Place 1 application into the nose daily as needed (nose sores)., Disp: , Rfl:  .  neomycin-bacitracin-polymyxin (NEOSPORIN) ointment, Apply 1 application topically as needed for wound care., Disp: , Rfl:  .  OVER THE COUNTER MEDICATION, Take 1 tablet by mouth 2 (two) times daily as needed (cold symptoms). Counter-act otc cold med, Disp: , Rfl:  .  potassium chloride SA (K-DUR) 20 MEQ tablet, Take 10 mEq by mouth daily. , Disp: , Rfl:  .  UNABLE TO FIND, Med Name: CPAP  DME-[facility], Disp: , Rfl:  .  UPTRAVI 1600 MCG TABS, TAKE 1 TABLET TWO TIMES A DAY (Patient taking differently: Take 1,600 mcg by mouth 2 (two) times daily. ), Disp: 60 tablet, Rfl: 1 .  vitamin B-12 (CYANOCOBALAMIN) 1000 MCG tablet, Take 1,000 mcg by mouth daily in the afternoon. , Disp: , Rfl:  .  colestipol (COLESTID) 1 g tablet, Take 2 g by mouth at bedtime as needed (digestive issues.). , Disp: , Rfl:  .  ondansetron (ZOFRAN-ODT) 4 MG disintegrating tablet, Take 1 tablet (4 mg total) by mouth every 8 (eight) hours as needed for  nausea or vomiting. (Patient not taking: Reported on 03/29/2019), Disp: 20 tablet, Rfl: 0 .  pantoprazole (PROTONIX) 40 MG tablet, TAKE 1 TABLET BY MOUTH EVERY DAY (Patient not taking: No sig reported), Disp: 90 tablet, Rfl: 2  Past Medical History: Past Medical History:  Diagnosis Date  . 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 Apical variant hypertrophic cardiomyopathy ([place])    Diagnosed by echo and ECG 11/13  . Atypical atrial flutter ([place])   . CAD (coronary artery disease)    a. LHC in 10/2011 demonstrated non-obstructive disease and an 80% lesion in a small D1 treated medically.   . CHF (congestive heart failure) ([place])   . Chronic diastolic heart failure ([place])   . CKD (chronic kidney disease), stage  III   . COPD (chronic obstructive pulmonary disease) ([place])   . Dizziness    had PT for being off balance - in approx. 2012  . GERD (gastroesophageal reflux disease)   . Hyperlipidemia   . Hypertension   . Hypothyroid   . Lumbar disc disease   . Macular degeneration   . OSA (obstructive sleep apnea)   . Persistent atrial fibrillation ([place])       . Pulmonary hypertension ([place])    a. [place] 05/2013 showed mild PAH with normal PCWP and RA pressure, could be related to OSA and low oxygen saturation.   . Refusal of blood transfusions as patient is Jehovah's Witness   . Sinus bradycardia     Tobacco Use: Social History   Tobacco Use  Smoking Status Former Smoker  . Packs/day: 0.50  . Years: 44.00  . Pack years: 22.00  . Types: Cigarettes  . Quit date: 04/07/2009  . Years since quitting: 9.9  Smokeless Tobacco Never Used    Labs: Recent Review Flowsheet Data    Labs for ITP Cardiac and Pulmonary Rehab Latest Ref Rng & Units 08/29/2016 09/12/2016 06/10/2017 06/10/2017 01/11/2019   Cholestrol 100 - 199 mg/dL 126 105 - - -   LDLCALC 0 - 99 mg/dL 59 49 - - -   LDLDIRECT mg/dL - - - - -   HDL >39 mg/dL 44 41 - - -   Trlycerides 0 - 149 mg/dL 117 76 - - -   Hemoglobin A1c 4.8 - 5.6 % - - - - -   PHART 7.350 - 7.450 - - - - -   PCO2ART 35.0 - 45.0 mmHg - - - - -   HCO3 20.0 - 28.0 mmol/L - - 22.3 23.1 -   TCO2 22 - 32 mmol/L - - 23 24 20(L)   ACIDBASEDEF 0.0 - 2.0 mmol/L - - 3.0(H) 3.0(H) -   O2SAT % - - 60.0 58.0 -      Capillary Blood Glucose: Lab Results  Component Value Date   GLUCAP 89 12/05/2017   GLUCAP 78 11/30/2017     Pulmonary Assessment Scores: Pulmonary Assessment Scores    Row Name 03/29/19 1514         ADL UCSD   ADL Phase  Entry     SOB Score total  68       CAT Score   CAT Score  15       UCSD: Self-administered rating of dyspnea associated with activities of daily living (ADLs) 6-point scale (0 = "not at all" to 5 = "maximal or unable to do because of  breathlessness")  Scoring Scores range from 0 to 120.  Minimally important difference is 5 units  CAT: CAT can  identify the health impairment of COPD patients and is better correlated with disease progression.  CAT has a scoring range of zero to 40. The CAT score is classified into four groups of low (less than 10), medium (10 - 20), high (21-30) and very high (31-40) based on the impact level of disease on health status. A CAT score over 10 suggests significant symptoms.  A worsening CAT score could be explained by an exacerbation, poor medication adherence, poor inhaler technique, or progression of COPD or comorbid conditions.  CAT MCID is 2 points  mMRC: mMRC (Modified Medical Research Council) Dyspnea Scale is used to assess the degree of baseline functional disability in patients of respiratory disease due to dyspnea. No minimal important difference is established. A decrease in score of 1 point or greater is considered a positive change.   Pulmonary Function Assessment: Pulmonary Function Assessment - 03/29/19 1514      Breath   Bilateral Breath Sounds  Clear    Shortness of Breath  Yes;Fear of Shortness of Breath;Limiting activity       Exercise Target Goals: Exercise Program Goal: Individual exercise prescription set using results from initial 6 min walk test and THRR while considering  patient's activity barriers and safety.   Exercise Prescription Goal: Initial exercise prescription builds to 30-45 minutes a day of aerobic activity, 2-3 days per week.  Home exercise guidelines will be given to patient during program as part of exercise prescription that the participant will acknowledge.  Activity Barriers & Risk Stratification: Activity Barriers & Cardiac Risk Stratification - 03/29/19 1503      Activity Barriers & Cardiac Risk Stratification   Activity Barriers  Back Problems;Arthritis;Deconditioning;Muscular Weakness;Shortness of Breath       6 Minute Walk: 6 Minute  Walk    Row Name 03/29/19 1541         6 Minute Walk   Distance  400 feet     Walk Time  4 minutes     # of Rest Breaks  3     MPH  0.76     METS  0.74     RPE  13     Perceived Dyspnea   3     VO2 Peak  2.59     Symptoms  Yes (comment)     Comments  pushed wheel chair. 3 standing breaks due to desaturation     Resting HR  76 bpm     Resting BP  108/64     Resting Oxygen Saturation   93 %     Exercise Oxygen Saturation  during 6 min walk  77 %     Max Ex. HR  105 bpm     Max Ex. BP  122/68     2 Minute Post BP  104/68       Interval HR   1 Minute HR  91     2 Minute HR  95     3 Minute HR  90     4 Minute HR  87     5 Minute HR  90     6 Minute HR  105     2 Minute Post HR  88     Interval Heart Rate?  Yes       Interval Oxygen   Interval Oxygen?  Yes     Baseline Oxygen Saturation %  93 %     1 Minute Oxygen Saturation %  90 %  1 Minute Liters of Oxygen  4 L     2 Minute Oxygen Saturation %  84 %     2 Minute Liters of Oxygen  4 L     3 Minute Oxygen Saturation %  91 %     3 Minute Liters of Oxygen  6 L     4 Minute Oxygen Saturation %  84 %     4 Minute Liters of Oxygen  6 L     5 Minute Oxygen Saturation %  77 %     5 Minute Liters of Oxygen  8 L     6 Minute Oxygen Saturation %  94 %     6 Minute Liters of Oxygen  10 L     2 Minute Post Oxygen Saturation %  96 %     2 Minute Post Liters of Oxygen  4 L        Oxygen Initial Assessment: Oxygen Initial Assessment - 03/29/19 1548      Home Oxygen   Home Oxygen Device  Home Concentrator;E-Tanks    Sleep Oxygen Prescription  Continuous;CPAP    Liters per minute  4    Home Exercise Oxygen Prescription  Continuous    Liters per minute  10    Home at Rest Exercise Oxygen Prescription  Continuous    Liters per minute  4    Compliance with Home Oxygen Use  Yes      Initial 6 min Walk   Oxygen Used  Continuous;E-Tanks    Liters per minute  10      Program Oxygen Prescription   Program Oxygen  Prescription  Continuous;E-Tanks    Liters per minute  10      Intervention   Short Term Goals  To learn and exhibit compliance with exercise, home and travel O2 prescription;To learn and understand importance of monitoring SPO2 with pulse oximeter and demonstrate accurate use of the pulse oximeter.;To learn and understand importance of maintaining oxygen saturations>88%;To learn and demonstrate proper pursed lip breathing techniques or other breathing techniques.;To learn and demonstrate proper use of respiratory medications    Long  Term Goals  Exhibits compliance with exercise, home and travel O2 prescription;Verbalizes importance of monitoring SPO2 with pulse oximeter and return demonstration;Maintenance of O2 saturations>88%;Exhibits proper breathing techniques, such as pursed lip breathing or other method taught during program session;Compliance with respiratory medication;Demonstrates proper use of MDI's       Oxygen Re-Evaluation:   Oxygen Discharge (Final Oxygen Re-Evaluation):   Initial Exercise Prescription: Initial Exercise Prescription - 03/29/19 1500      Date of Initial Exercise RX and Referring Provider   Date  03/29/19    Referring Provider  Dr. Aundra Dubin      Oxygen   Oxygen  Continuous    Liters  10      Recumbant Bike   Level  1    Minutes  15      NuStep   Level  2    SPM  80    Minutes  15      Prescription Details   Frequency (times per week)  2    Duration  Progress to 30 minutes of continuous aerobic without signs/symptoms of physical distress      Intensity   THRR 40-80% of Max Heartrate  59-118    Ratings of Perceived Exertion  11-13    Perceived Dyspnea  0-4      Progression   Progression  Continue  progressive overload as per policy without signs/symptoms or physical distress.      Resistance Training   Training Prescription  Yes    Weight  Orange bands    Reps  10-15       Perform Capillary Blood Glucose checks as needed.  Exercise  Prescription Changes:   Exercise Comments:   Exercise Goals and Review: Exercise Goals    Row Name 03/29/19 1548             Exercise Goals   Increase Physical Activity  Yes       Intervention  Provide advice, education, support and counseling about physical activity/exercise needs.;Develop an individualized exercise prescription for aerobic and resistive training based on initial evaluation findings, risk stratification, comorbidities and participant's personal goals.       Expected Outcomes  Short Term: Attend rehab on a regular basis to increase amount of physical activity.;Long Term: Add in home exercise to make exercise part of routine and to increase amount of physical activity.;Long Term: Exercising regularly at least 3-5 days a week.       Increase Strength and Stamina  Yes       Intervention  Provide advice, education, support and counseling about physical activity/exercise needs.;Develop an individualized exercise prescription for aerobic and resistive training based on initial evaluation findings, risk stratification, comorbidities and participant's personal goals.       Expected Outcomes  Short Term: Increase workloads from initial exercise prescription for resistance, speed, and METs.;Short Term: Perform resistance training exercises routinely during rehab and add in resistance training at home;Long Term: Improve cardiorespiratory fitness, muscular endurance and strength as measured by increased METs and functional capacity (6MWT)       Able to understand and use rate of perceived exertion (RPE) scale  Yes       Intervention  Provide education and explanation on how to use RPE scale       Expected Outcomes  Short Term: Able to use RPE daily in rehab to express subjective intensity level;Long Term:  Able to use RPE to guide intensity level when exercising independently       Able to understand and use Dyspnea scale  Yes       Intervention  Provide education and explanation on how  to use Dyspnea scale       Expected Outcomes  Short Term: Able to use Dyspnea scale daily in rehab to express subjective sense of shortness of breath during exertion;Long Term: Able to use Dyspnea scale to guide intensity level when exercising independently       Knowledge and understanding of Target Heart Rate Range (THRR)  Yes       Intervention  Provide education and explanation of THRR including how the numbers were predicted and where they are located for reference       Expected Outcomes  Short Term: Able to state/look up THRR;Short Term: Able to use daily as guideline for intensity in rehab;Long Term: Able to use THRR to govern intensity when exercising independently       Understanding of Exercise Prescription  Yes       Intervention  Provide education, explanation, and written materials on patient's individual exercise prescription       Expected Outcomes  Short Term: Able to explain program exercise prescription;Long Term: Able to explain home exercise prescription to exercise independently          Exercise Goals Re-Evaluation :   Discharge Exercise Prescription (Final Exercise Prescription Changes):  Nutrition:  Target Goals: Understanding of nutrition guidelines, daily intake of sodium <1534m, cholesterol <2076m calories 30% from fat and 7% or less from saturated fats, daily to have 5 or more servings of fruits and vegetables.  Biometrics: Pre Biometrics - 03/29/19 1504      Pre Biometrics   Grip Strength  24 kg        Nutrition Therapy Plan and Nutrition Goals:   Nutrition Assessments:   Nutrition Goals Re-Evaluation:   Nutrition Goals Discharge (Final Nutrition Goals Re-Evaluation):   Psychosocial: Target Goals: Acknowledge presence or absence of significant depression and/or stress, maximize coping skills, provide positive support system. Participant is able to verbalize types and ability to use techniques and skills needed for reducing stress and  depression.  Initial Review & Psychosocial Screening: Initial Psych Review & Screening - 03/29/19 1517      Initial Review   Current issues with  None Identified      Family Dynamics   Good Support System?  Yes      Barriers   Psychosocial barriers to participate in program  There are no identifiable barriers or psychosocial needs.      Screening Interventions   Interventions  Encouraged to exercise       Quality of Life Scores:  Scores of 19 and below usually indicate a poorer quality of life in these areas.  A difference of  2-3 points is a clinically meaningful difference.  A difference of 2-3 points in the total score of the Quality of Life Index has been associated with significant improvement in overall quality of life, self-image, physical symptoms, and general health in studies assessing change in quality of life.  PHQ-9: Recent Review Flowsheet Data    [facility] 03/29/2019 12/21/2017 12/17/2017 09/16/2017 05/07/2017   Decreased Interest 0 0 0 1 1   Down, Depressed, Hopeless 0 0 0 1 1   PHQ - 2 Score 0 0 0 2 2   Altered sleeping 0 - - - 3   Tired, decreased energy 1 - - - 3   Change in appetite 0 - - - 0   Feeling bad or failure about yourself  0 - - - 1   Trouble concentrating 0 - - - 0   Moving slowly or fidgety/restless 0 - - - 0   Suicidal thoughts 0 - - - 1   PHQ-9 Score - - - - 10   Difficult doing work/chores Not difficult at all - - - Not difficult at all     Interpretation of Total Score  Total Score Depression Severity:  1-4 = Minimal depression, 5-9 = Mild depression, 10-14 = Moderate depression, 15-19 = Moderately severe depression, 20-27 = Severe depression   Psychosocial Evaluation and Intervention: Psychosocial Evaluation - 03/29/19 1518      Psychosocial Evaluation & Interventions   Interventions  Encouraged to exercise with the program and follow exercise prescription    Continue Psychosocial Services   No Follow up required        Psychosocial Re-Evaluation:   Psychosocial Discharge (Final Psychosocial Re-Evaluation):   Education: Education Goals: Education classes will be provided on a weekly basis, covering required topics. Participant will state understanding/return demonstration of topics presented.  Learning Barriers/Preferences: Learning Barriers/Preferences - 03/29/19 1518      Learning Barriers/Preferences   Learning Barriers  None    Learning Preferences  Audio;Computer/Internet;Group Instruction;Individual Instruction;Pictoral;Skilled Demonstration;Verbal Instruction;Video;Written Material       Education Topics: Risk Factor Reduction:  -  Group instruction that is supported by a PowerPoint presentation. Instructor discusses the definition of a risk factor, different risk factors for pulmonary disease, and how the heart and lungs work together.     Nutrition for Pulmonary Patient:  -Group instruction provided by PowerPoint slides, verbal discussion, and written materials to support subject matter. The instructor gives an explanation and review of healthy diet recommendations, which includes a discussion on weight management, recommendations for fruit and vegetable consumption, as well as protein, fluid, caffeine, fiber, sodium, sugar, and alcohol. Tips for eating when patients are short of breath are discussed.   PULMONARY REHAB OTHER RESPIRATORY from 04/16/2017 in [facility]  Date  11/27/16  Educator  edna  Instruction Review Code (Retired)  2- meets goals/outcomes      Pursed Lip Breathing:  -Group instruction that is supported by demonstration and informational handouts. Instructor discusses the benefits of pursed lip and diaphragmatic breathing and detailed demonstration on how to preform both.     Oxygen Safety:  -Group instruction provided by PowerPoint, verbal discussion, and written material to support subject matter. There is an overview of "What is  Oxygen" and "Why do we need it".  Instructor also reviews how to create a safe environment for oxygen use, the importance of using oxygen as prescribed, and the risks of noncompliance. There is a brief discussion on traveling with oxygen and resources the patient may utilize.   PULMONARY REHAB OTHER RESPIRATORY from 04/16/2017 in [facility]  Date  11/13/16  Educator  portia  Instruction Review Code (Retired)  2- meets goals/outcomes      Oxygen Equipment:  -Group instruction provided by [employer], Network engineer, and Insurance underwriter.   Signs and Symptoms:  -Group instruction provided by written material and verbal discussion to support subject matter. Warning signs and symptoms of infection, stroke, and heart attack are reviewed and when to call the physician/911 reinforced. Tips for preventing the spread of infection discussed.   PULMONARY REHAB OTHER RESPIRATORY from 04/16/2017 in [facility]  Date  12/25/16  Educator  rn  Instruction Review Code (Retired)  2- meets goals/outcomes      Advanced Directives:  -Group instruction provided by verbal instruction and written material to support subject matter. Instructor reviews Advanced Directive laws and proper instruction for filling out document.   Pulmonary Video:  -Group video education that reviews the importance of medication and oxygen compliance, exercise, good nutrition, pulmonary hygiene, and pursed lip and diaphragmatic breathing for the pulmonary patient.   PULMONARY REHAB OTHER RESPIRATORY from 04/16/2017 in [facility]  Date  01/01/17  Instruction Review Code (Retired)  2- meets goals/outcomes      Exercise for the Pulmonary Patient:  -Group instruction that is supported by a PowerPoint presentation. Instructor discusses benefits of exercise, core components of exercise, frequency, duration,  and intensity of an exercise routine, importance of utilizing pulse oximetry during exercise, safety while exercising, and options of places to exercise outside of rehab.     PULMONARY REHAB OTHER RESPIRATORY from 04/16/2017 in [facility]  Date  12/16/16  Educator  ep  Instruction Review Code (Retired)  2- meets goals/outcomes      Pulmonary Medications:  -Verbally interactive group education provided by Art therapist with focus on inhaled medications and proper administration.   PULMONARY REHAB OTHER RESPIRATORY from 04/16/2017 in [facility]  Date  02/05/17  Educator  pharmacist  Instruction Review Code (Retired)  R- Product/process development scientist and Physiology of the Respiratory System and Intimacy:  -Group instruction provided by PowerPoint, verbal discussion, and written material to support subject matter. Instructor reviews respiratory cycle and anatomical components of the respiratory system and their functions. Instructor also reviews differences in obstructive and restrictive respiratory diseases with examples of each. Intimacy, Sex, and Sexuality differences are reviewed with a discussion on how relationships can change when diagnosed with pulmonary disease. Common sexual concerns are reviewed.   MD DAY -A group question and answer session with a medical doctor that allows participants to ask questions that relate to their pulmonary disease state.   PULMONARY REHAB OTHER RESPIRATORY from 04/16/2017 in [facility]  Date  04/16/17  Educator  [email or web address]  Instruction Review Code (Retired)  R- Review/reinforce      OTHER EDUCATION -Group or individual verbal, written, or video instructions that support the educational goals of the pulmonary rehab program.   PULMONARY REHAB OTHER RESPIRATORY from 04/16/2017 in [facility]  Date  02/12/17  Educator  Parke Simmers  Instruction  Review Code  1- Verbalizes Understanding      Holiday Eating Survival Tips:  -Group instruction provided by [employer], verbal discussion, and written materials to support subject matter. The instructor gives patients tips, tricks, and techniques to help them not only survive but enjoy the holidays despite the onslaught of food that accompanies the holidays.   Knowledge Questionnaire Score: Knowledge Questionnaire Score - 03/29/19 1517      Knowledge Questionnaire Score   Pre Score  16/1/8       Core Components/Risk Factors/Patient Goals at Admission: Personal Goals and Risk Factors at Admission - 03/29/19 1519      Core Components/Risk Factors/Patient Goals on Admission   Heart Failure  Yes       Core Components/Risk Factors/Patient Goals Review:  Goals and Risk Factor Review    Row Name 03/29/19 1519             Core Components/Risk Factors/Patient Goals Review   Personal Goals Review  Develop more efficient breathing techniques such as purse lipped breathing and diaphragmatic breathing and practicing self-pacing with activity.;Increase knowledge of respiratory medications and ability to use respiratory devices properly.;Improve shortness of breath with ADL's;Heart Failure          Core Components/Risk Factors/Patient Goals at Discharge (Final Review):  Goals and Risk Factor Review - 03/29/19 1519      Core Components/Risk Factors/Patient Goals Review   Personal Goals Review  Develop more efficient breathing techniques such as purse lipped breathing and diaphragmatic breathing and practicing self-pacing with activity.;Increase knowledge of respiratory medications and ability to use respiratory devices properly.;Improve shortness of breath with ADL's;Heart Failure       ITP Comments:   Comments:

## 2019-03-29 NOTE — Progress Notes (Signed)
Spoke to pt regarding Virtual Cardiac  and Pulmonary Rehab.  Pt  was able to download the [employer] app on their smart device with no issues. Pt set up their account and received the following welcome message -"Welcome to the [facility] and Pulmonary Rehabilitation program. We hope that you will find the exercise program beneficial in your recovery process. Our staff is available to assist with any questions/concerns about your exercise routine. Best wishes". Brief orientation provided to with the advisement to watch the "Intro to Rehab" series located under the Resource tab. Pt verbalized understanding. Will continue to follow and monitor pt progress with feedback as needed.

## 2019-03-30 ENCOUNTER — Telehealth (HOSPITAL_COMMUNITY): Payer: Self-pay | Admitting: Licensed Clinical Social Worker

## 2019-03-30 NOTE — Telephone Encounter (Signed)
CSW called [place], Tia Masker ext [phone], again to check on if pt would be able to bring oxygen to [place] with her and turn in tanks with sister store in [place]- left message.  CSW then called pt to check in and she states that she actually heard back from [facility] and that they have agreed that pt can take all of her oxygen supplies to [place] and turn them in when she gets there.  Pt grateful for assistance and will call back if there are further issues  Jorge Ny, [facility] Desk#: [phone] Cell#: [phone]

## 2019-04-05 ENCOUNTER — Encounter (HOSPITAL_COMMUNITY)
Admission: RE | Admit: 2019-04-05 | Discharge: 2019-04-05 | Disposition: A | Discharge status: Home / Self Care | Payer: Medicare Other | Source: Ambulatory Visit | Attending: Cardiology | Admitting: Cardiology

## 2019-04-05 ENCOUNTER — Other Ambulatory Visit: Payer: Self-pay

## 2019-04-05 VITALS — Wt 188.5 lb

## 2019-04-05 DIAGNOSIS — I272 Pulmonary hypertension, unspecified: Secondary | ICD-10-CM

## 2019-04-05 DIAGNOSIS — I5032 Chronic diastolic (congestive) heart failure: Secondary | ICD-10-CM

## 2019-04-05 NOTE — Progress Notes (Signed)
Daily Session Note  Patient Details  Name: Amy [name] MRN: [id number] Date of Birth: [birth year] Referring Provider:     Pulmonary Rehab Walk Test from 03/29/2019 in [place]  Referring Provider  Dr. Aundra Dubin      Encounter Date: 04/05/2019  Check In: Session Check In - 04/05/19 [place]      Check-In   Supervising physician immediately available to respond to emergencies  [facility] immediately available    Physician(s)  DR, [place] Crane    Location  [facility]    Staff Present  Rosebud Poles, RN, Bjorn Loser, MS, Exercise Physiologist;Yalissa Fink Ysidro Evert, RN    Virtual Visit  No    Medication changes reported      No    Fall or balance concerns reported     No    Tobacco Cessation  No Change    Warm-up and Cool-down  Performed on first and last piece of equipment    Resistance Training Performed  Yes    VAD Patient?  No    PAD/SET Patient?  No      Pain Assessment   Currently in Pain?  No/denies    Multiple Pain Sites  No       Capillary Blood Glucose: No results found for this or any previous visit (from the past 24 hour(s)).  Exercise Prescription Changes - 04/05/19 1500      Response to Exercise   Blood Pressure (Admit)  104/60    Blood Pressure (Exercise)  130/68    Blood Pressure (Exit)  106/66    Heart Rate (Admit)  79 bpm    Heart Rate (Exercise)  80 bpm    Heart Rate (Exit)  83 bpm    Oxygen Saturation (Admit)  88 %    Oxygen Saturation (Exercise)  90 %    Oxygen Saturation (Exit)  98 %    Rating of Perceived Exertion (Exercise)  11    Perceived Dyspnea (Exercise)  1    Duration  Progress to 30 minutes of  aerobic without signs/symptoms of physical distress    Intensity  Other (comment)      Progression   Progression  --   40-80% of HRR     Resistance Training   Training Prescription  Yes    Weight  orange bands    Reps  10-15    Time  10 Minutes      Oxygen   Oxygen  Continuous    Liters  8      Recumbant Bike   Level  1    Minutes  15      NuStep   Level  2    SPM  80    Minutes  15    METs  1.3       Social History   Tobacco Use  Smoking Status Former Smoker  . Packs/day: 0.50  . Years: 44.00  . Pack years: 22.00  . Types: Cigarettes  . Quit date: 04/07/2009  . Years since quitting: 10.0  Smokeless Tobacco Never Used    Goals Met:  Exercise tolerated well No report of cardiac concerns or symptoms Strength training completed today  Goals Unmet:  Not Applicable  Comments: Service time is from 1250 to 1357    Dr. Rush Farmer is Medical Director for Pulmonary Rehab at [facility].

## 2019-04-06 ENCOUNTER — Encounter (HOSPITAL_COMMUNITY): Payer: Medicare Other

## 2019-04-07 ENCOUNTER — Encounter (HOSPITAL_COMMUNITY)
Admission: RE | Admit: 2019-04-07 | Discharge: 2019-04-07 | Disposition: A | Discharge status: Home / Self Care | Payer: Medicare Other | Source: Ambulatory Visit | Attending: Cardiology | Admitting: Cardiology

## 2019-04-07 ENCOUNTER — Other Ambulatory Visit: Payer: Self-pay

## 2019-04-07 DIAGNOSIS — I5032 Chronic diastolic (congestive) heart failure: Secondary | ICD-10-CM

## 2019-04-07 NOTE — Progress Notes (Signed)
Rise Paganini A [name] 73 y.o. female Nutrition Note  Past Medical History:  Diagnosis Date  . 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 Apical variant hypertrophic cardiomyopathy ([place])    Diagnosed by echo and ECG 11/13  . Atypical atrial flutter ([place])   . CAD (coronary artery disease)    a. LHC in 10/2011 demonstrated non-obstructive disease and an 80% lesion in a small D1 treated medically.   . CHF (congestive heart failure) ([place])   . Chronic diastolic heart failure ([place])   . CKD (chronic kidney disease), stage III   . COPD (chronic obstructive pulmonary disease) ([place])   . Dizziness    had PT for being off balance - in approx. 2012  . GERD (gastroesophageal reflux disease)   . Hyperlipidemia   . Hypertension   . Hypothyroid   . Lumbar disc disease   . Macular degeneration   . OSA (obstructive sleep apnea)   . Persistent atrial fibrillation ([place])       . Pulmonary hypertension ([place])    a. [place] 05/2013 showed mild PAH with normal PCWP and RA pressure, could be related to OSA and low oxygen saturation.   . Refusal of blood transfusions as patient is Jehovah's Witness   . Sinus bradycardia      Medications reviewed.   Current Outpatient Medications:  .  acetaminophen (TYLENOL) 325 MG tablet, Take 2 tablets (650 mg total) by mouth every 4 (four) hours as needed for headache or mild pain., Disp: , Rfl:  .  apixaban (ELIQUIS) 5 MG TABS tablet, Take 1 tablet (5 mg total) by mouth 2 (two) times daily. Restart 02/04/19, Disp: 60 tablet, Rfl: 3 .  atorvastatin (LIPITOR) 80 MG tablet, Take 1 tablet (80 mg total) by mouth daily with lunch. (Patient taking differently: Take 80 mg by mouth daily. ), Disp: 90 tablet, Rfl: 3 .  azaTHIOprine (IMURAN) 50 MG tablet, Take 100 mg by mouth at bedtime. , Disp: , Rfl:  .  Cholecalciferol (VITAMIN D) 2000 UNITS CAPS, Take 2,000 Units by mouth daily at 12 noon. , Disp: , Rfl:  .  COLCRYS 0.6 MG tablet, TAKE 1 TABLET BY MOUTH DAILY (Patient taking differently:  Take 0.6 mg by mouth daily. ), Disp: 30 tablet, Rfl: 0 .  colestipol (COLESTID) 1 g tablet, Take 2 g by mouth at bedtime as needed (digestive issues.). , Disp: , Rfl:  .  ezetimibe (ZETIA) 10 MG tablet, Take 10 mg by mouth daily., Disp: , Rfl:  .  folic acid (FOLVITE) 1 MG tablet, Take 1 mg by mouth daily in the afternoon. In the afternoon., Disp: , Rfl:  .  furosemide (LASIX) 20 MG tablet, Take 20 mg by mouth daily at 12 noon. , Disp: , Rfl:  .  HYDROcodone-acetaminophen (NORCO/VICODIN) 5-325 MG tablet, Take 1 tablet by mouth every 6 (six) hours as needed for moderate pain., Disp: , Rfl:  .  levothyroxine (SYNTHROID) 100 MCG tablet, Take 100 mcg by mouth daily. , Disp: , Rfl:  .  macitentan (OPSUMIT) 10 MG tablet, Take 1 tablet (10 mg total) by mouth daily., Disp: 30 tablet, Rfl: 11 .  Multiple Vitamin (MULTIVITAMIN) capsule, Take 1 capsule by mouth every evening. , Disp: , Rfl:  .  mupirocin nasal ointment (BACTROBAN) 2 %, Place 1 application into the nose daily as needed (nose sores)., Disp: , Rfl:  .  neomycin-bacitracin-polymyxin (NEOSPORIN) ointment, Apply 1 application topically as needed for wound care., Disp: , Rfl:  .  ondansetron (ZOFRAN-ODT)  4 MG disintegrating tablet, Take 1 tablet (4 mg total) by mouth every 8 (eight) hours as needed for nausea or vomiting. (Patient not taking: Reported on 03/29/2019), Disp: 20 tablet, Rfl: 0 .  OVER THE COUNTER MEDICATION, Take 1 tablet by mouth 2 (two) times daily as needed (cold symptoms). Counter-act otc cold med, Disp: , Rfl:  .  pantoprazole (PROTONIX) 40 MG tablet, TAKE 1 TABLET BY MOUTH EVERY DAY (Patient not taking: No sig reported), Disp: 90 tablet, Rfl: 2 .  potassium chloride SA (K-DUR) 20 MEQ tablet, Take 10 mEq by mouth daily. , Disp: , Rfl:  .  UNABLE TO FIND, Med Name: CPAP  DME-[facility], Disp: , Rfl:  .  UPTRAVI 1600 MCG TABS, TAKE 1 TABLET TWO TIMES A DAY (Patient taking differently: Take 1,600 mcg by mouth 2 (two) times daily. ), Disp: 60  tablet, Rfl: 1 .  vitamin B-12 (CYANOCOBALAMIN) 1000 MCG tablet, Take 1,000 mcg by mouth daily in the afternoon. , Disp: , Rfl:    Ht Readings from Last 1 Encounters:  03/29/19 _0  (1.6 m)     Wt Readings from Last 3 Encounters:  04/05/19 188 lb 7.9 oz (85.5 kg)  03/29/19 191 lb 9.3 oz (86.9 kg)  03/01/19 185 lb 9.6 oz (84.2 kg)     There is no height or weight on file to calculate BMI.   Social History   Tobacco Use  Smoking Status Former Smoker  . Packs/day: 0.50  . Years: 44.00  . Pack years: 22.00  . Types: Cigarettes  . Quit date: 04/07/2009  . Years since quitting: 10.0  Smokeless Tobacco Never Used     Lab Results  Component Value Date   CHOL 105 09/12/2016   Lab Results  Component Value Date   HDL 41 09/12/2016   Lab Results  Component Value Date   LDLCALC 49 09/12/2016   Lab Results  Component Value Date   TRIG 76 09/12/2016   Lab Results  Component Value Date   CHOLHDL 2.6 09/12/2016     Lab Results  Component Value Date   HGBA1C 6.1 (H) 10/28/2014     CBG (last 3)  No results for input(s): GLUCAP in the last 72 hours.   Nutrition Note  Spoke with pt. Nutrition Plan and Nutrition Survey goals reviewed with pt. Pt is trying to follow a low sodium diet. Pt wants to lose wt. Pt has lost ~30 lbs in the last few years and has a goal of losing 20 more. Wt loss tips reviewed (label reading, how to build a healthy plate, portion sizes, eating frequently across the day). Pt tends to eat more sugary snacks (cobbler, ice cream, etc). Discussed healthier snacks and provided handout for ideas.  Pt expressed understanding of the information reviewed.    Nutrition Diagnosis Obese  I = 30-34.9 related to excessive energy intake as evidenced by a 33.39  Nutrition Intervention ? Pt's individual nutrition plan reviewed with pt. ? Benefits of adopting Heart Healthy diet discussed when Medficts reviewed.   ? Continue client-centered nutrition education by  RD, as part of interdisciplinary care.  Goal(s) ? Pt to identify food quantities necessary to achieve weight loss of 6-24 lb at graduation from cardiac rehab.  Plan:   Will provide client-centered nutrition education as part of interdisciplinary care  Monitor and evaluate progress toward nutrition goal with team.   Michaele Offer, MS, RDN, LDN

## 2019-04-07 NOTE — Progress Notes (Signed)
Daily Session Note  Patient Details  Name: Amy [name] MRN: [id number] Date of Birth: 1946-01-17 Referring Provider:     Pulmonary Rehab Walk Test from 03/29/2019 in [facility]  Referring Provider  Dr. Aundra Dubin      Encounter Date: 04/07/2019  Check In: Session Check In - 04/07/19 1307      Check-In   Supervising physician immediately available to respond to emergencies  [facility] immediately available    Physician(s)  Dr. [place] Crane    Location  [facility]    Staff Present  Rosebud Poles, RN, Bjorn Loser, MS, Exercise Physiologist;Lilliauna Van Ysidro Evert, RN    Virtual Visit  No    Medication changes reported      No    Fall or balance concerns reported     No    Tobacco Cessation  No Change    Warm-up and Cool-down  Performed as group-led instruction    Resistance Training Performed  Yes    VAD Patient?  No    PAD/SET Patient?  No      Pain Assessment   Currently in Pain?  No/denies    Multiple Pain Sites  No       Capillary Blood Glucose: No results found for this or any previous visit (from the past 24 hour(s)).    Social History   Tobacco Use  Smoking Status Former Smoker  . Packs/day: 0.50  . Years: 44.00  . Pack years: 22.00  . Types: Cigarettes  . Quit date: 04/07/2009  . Years since quitting: 10.0  Smokeless Tobacco Never Used    Goals Met:  Exercise tolerated well No report of cardiac concerns or symptoms Strength training completed today  Goals Unmet:  Not Applicable  Comments: Service time is from 1250 to 1410    Dr. Rush Farmer is Medical Director for Pulmonary Rehab at [facility].

## 2019-04-12 ENCOUNTER — Other Ambulatory Visit: Payer: Self-pay

## 2019-04-12 ENCOUNTER — Encounter (HOSPITAL_COMMUNITY)
Admission: RE | Admit: 2019-04-12 | Discharge: 2019-04-12 | Disposition: A | Discharge status: Home / Self Care | Payer: Self-pay | Source: Ambulatory Visit | Attending: Cardiology | Admitting: Cardiology

## 2019-04-12 ENCOUNTER — Other Ambulatory Visit (HOSPITAL_COMMUNITY): Payer: Self-pay

## 2019-04-12 DIAGNOSIS — I5032 Chronic diastolic (congestive) heart failure: Secondary | ICD-10-CM | POA: Insufficient documentation

## 2019-04-12 MED ORDER — UPTRAVI 1600 MCG PO TABS: 1.0000 | ORAL_TABLET | Freq: Two times a day (BID) | ORAL | 1 refills | Status: DC

## 2019-04-12 NOTE — Progress Notes (Signed)
Successful telephone encounter to [name] in response to symptoms logged in [employer], Electronics engineer Rehab App. Per Amy [name], she experienced shortness of breath slightly beyond her baseline with exertion secondary to forgetting to increase her oxygen from 4 liters to 8 liters with exercise. Amy [name] states she has recovered back to baseline with no lasting effects from failure to increase her O2. Daily reminders to increase O2 during exercise has been added to [name] daily exercise prescription. Amy [name] is appreciative of call. Will continue to follow via app/telephonic. Treyshon Buchanon E. Laray Anger, BSN

## 2019-04-14 ENCOUNTER — Telehealth (HOSPITAL_COMMUNITY): Payer: Self-pay | Admitting: *Deleted

## 2019-04-14 ENCOUNTER — Encounter (HOSPITAL_COMMUNITY)
Admission: RE | Admit: 2019-04-14 | Discharge: 2019-04-14 | Disposition: A | Discharge status: Home / Self Care | Payer: Self-pay | Source: Ambulatory Visit | Attending: Cardiology | Admitting: Cardiology

## 2019-04-14 NOTE — Telephone Encounter (Signed)
Patient reported having a runny nose said that she took some cold tablets. Patient reported on the virtual pulmonary rehab APP that she felt lightheaded with a blood pressure of 89/59. Patient oxygen saturation was 96% on 4l/min of oxygen. Patient did not exercise earlier and said that she is lying down now. Patient rechecked her blood pressure and said that her blood pressure was 121/86 heart rate 84 with an oxygen saturation of 90% on 4l/min. The heart failure clinic was called and notified. Amy [name] said that she will follow up with the patient. Medications reviewed. Patient reports that she feels a lttile better now. Spoke with Barnet Pall, RN,BSN 04/14/2019 4:05 PM

## 2019-04-18 ENCOUNTER — Encounter (HOSPITAL_COMMUNITY)
Admission: RE | Admit: 2019-04-18 | Discharge: 2019-04-18 | Disposition: A | Discharge status: Home / Self Care | Payer: Self-pay | Source: Ambulatory Visit | Attending: Cardiology | Admitting: Cardiology

## 2019-04-18 ENCOUNTER — Other Ambulatory Visit: Payer: Self-pay

## 2019-04-18 ENCOUNTER — Telehealth (HOSPITAL_COMMUNITY): Payer: Self-pay | Admitting: *Deleted

## 2019-04-18 DIAGNOSIS — I5032 Chronic diastolic (congestive) heart failure: Secondary | ICD-10-CM

## 2019-04-18 NOTE — Telephone Encounter (Signed)
Called to check on patient, last week she had logged a low blood pressure before exercising at home through virtual pulmonary rehab.  She has not had any further low blood pressures readings and has purchased an exercise bike she can use with a regular chair and pedal sitting. She has not used it yet.  Plan: Follow up with patient after she has exercised at home with the bicycle tomorrow.  Call 04/19/2019 @ 3 pm.

## 2019-04-19 ENCOUNTER — Telehealth (HOSPITAL_COMMUNITY): Payer: Self-pay | Admitting: *Deleted

## 2019-04-19 ENCOUNTER — Encounter (HOSPITAL_COMMUNITY)
Admission: RE | Admit: 2019-04-19 | Discharge: 2019-04-19 | Disposition: A | Discharge status: Home / Self Care | Payer: Self-pay | Source: Ambulatory Visit | Attending: Cardiology | Admitting: Cardiology

## 2019-04-19 DIAGNOSIS — I5032 Chronic diastolic (congestive) heart failure: Secondary | ICD-10-CM

## 2019-04-19 NOTE — Telephone Encounter (Signed)
Called to check on patient after she exercises on her bicycle at home.  She was able to log her vital signs and exercise on the [facility] app through virtual pulmonary rehab.  She was able to exercise for a total of 30 minutes, but she broke the session up into 2, 15 minute episodes.  She had no difficulty and she required 8 liters of oxygen while exercising.  Oxygen saturations post exercise were 98%.  PLAN:  Continue to monitor, guide, and encourage patient to exercise for now 2 days per week.  Follow-up 04/25/2010 @ 11 am.

## 2019-04-22 ENCOUNTER — Other Ambulatory Visit: Payer: Self-pay

## 2019-04-22 ENCOUNTER — Encounter (HOSPITAL_COMMUNITY)
Admission: RE | Admit: 2019-04-22 | Discharge: 2019-04-22 | Disposition: A | Discharge status: Home / Self Care | Payer: Self-pay | Source: Ambulatory Visit | Attending: Cardiology | Admitting: Cardiology

## 2019-04-22 DIAGNOSIS — I272 Pulmonary hypertension, unspecified: Secondary | ICD-10-CM | POA: Insufficient documentation

## 2019-04-22 DIAGNOSIS — I5032 Chronic diastolic (congestive) heart failure: Secondary | ICD-10-CM | POA: Insufficient documentation

## 2019-04-22 NOTE — Progress Notes (Signed)
Virtual Cardiac Rehab Note:  Successful telephone encounter to Amy [name] in response to logged symptom of right knee pain on the [facility] Pulmonary Rehab app. Per 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 She first noticed the pain today after getting out of bed and setting in a chair. States the pain is sharp/shooting and only occurs upon sitting. Maximum intensity is 5/10. Pain is immediately resolved when sitting motion is complete. Denies pain with standing, walking, or riding stationary bike. She was able to exercise for 30 min today without discomfort. Per patient, knee is not warm to touch however may be slightly swollen. She is encouraged to ice, elevate, and rest. She has used OTC topical arthritic pain cream in the past and feels comfortable using on the knee. She is instructed to contact her PCP if pain worsens or is not better with interventions stated above. She is encouraged to not participate in any activity that increases her discomfort. Patient verbalizes understanding.   Plan: Will forward note to Pulmonary Rehab RN for follow up next week  Janssen Zee E. Laray Anger, BSN

## 2019-04-26 ENCOUNTER — Other Ambulatory Visit: Payer: Self-pay

## 2019-04-26 ENCOUNTER — Encounter (HOSPITAL_COMMUNITY)
Admission: RE | Admit: 2019-04-26 | Discharge: 2019-04-26 | Disposition: A | Discharge status: Home / Self Care | Payer: Self-pay | Source: Ambulatory Visit | Attending: Cardiology | Admitting: Cardiology

## 2019-04-26 ENCOUNTER — Telehealth (HOSPITAL_COMMUNITY): Payer: Self-pay | Admitting: *Deleted

## 2019-04-26 NOTE — Telephone Encounter (Signed)
Called to check on patient via telephone in regards to virtual pulmonary rehab.  She logged a symptom of knee pain 4 days ago and was instructed to ice the knee and take over the counter analgesics, which she has done and it has improved.  For now she is exercising 2 days/week with a bike ergominter that she purchased.  Patient had no further questions or concerns.

## 2019-05-05 ENCOUNTER — Encounter: Payer: Medicare Other | Admitting: Internal Medicine

## 2019-05-09 ENCOUNTER — Telehealth (HOSPITAL_COMMUNITY): Payer: Self-pay

## 2019-05-09 NOTE — Telephone Encounter (Signed)

## 2019-05-10 ENCOUNTER — Other Ambulatory Visit: Payer: Self-pay

## 2019-05-10 ENCOUNTER — Telehealth (HOSPITAL_COMMUNITY): Payer: Self-pay

## 2019-05-10 ENCOUNTER — Ambulatory Visit (HOSPITAL_COMMUNITY)
Admission: RE | Admit: 2019-05-10 | Discharge: 2019-05-10 | Disposition: A | Discharge status: Home / Self Care | Payer: Medicare Other | Source: Ambulatory Visit | Attending: Cardiology | Admitting: Cardiology

## 2019-05-10 ENCOUNTER — Encounter (HOSPITAL_COMMUNITY)
Admission: RE | Admit: 2019-05-10 | Discharge: 2019-05-10 | Disposition: A | Discharge status: Home / Self Care | Payer: Medicare Other | Source: Ambulatory Visit | Attending: Cardiology | Admitting: Cardiology

## 2019-05-10 ENCOUNTER — Encounter (HOSPITAL_COMMUNITY): Payer: Self-pay | Admitting: Cardiology

## 2019-05-10 VITALS — BP 104/60 | HR 80 | Wt 192.6 lb

## 2019-05-10 DIAGNOSIS — I422 Other hypertrophic cardiomyopathy: Secondary | ICD-10-CM | POA: Insufficient documentation

## 2019-05-10 DIAGNOSIS — Z79899 Other long term (current) drug therapy: Secondary | ICD-10-CM | POA: Diagnosis not present

## 2019-05-10 DIAGNOSIS — I272 Pulmonary hypertension, unspecified: Secondary | ICD-10-CM | POA: Insufficient documentation

## 2019-05-10 DIAGNOSIS — M109 Gout, unspecified: Secondary | ICD-10-CM | POA: Insufficient documentation

## 2019-05-10 DIAGNOSIS — J449 Chronic obstructive pulmonary disease, unspecified: Secondary | ICD-10-CM | POA: Insufficient documentation

## 2019-05-10 DIAGNOSIS — K219 Gastro-esophageal reflux disease without esophagitis: Secondary | ICD-10-CM | POA: Insufficient documentation

## 2019-05-10 DIAGNOSIS — Z7901 Long term (current) use of anticoagulants: Secondary | ICD-10-CM | POA: Diagnosis not present

## 2019-05-10 DIAGNOSIS — Z79890 Hormone replacement therapy (postmenopausal): Secondary | ICD-10-CM | POA: Diagnosis not present

## 2019-05-10 DIAGNOSIS — Z801 Family history of malignant neoplasm of trachea, bronchus and lung: Secondary | ICD-10-CM | POA: Diagnosis not present

## 2019-05-10 DIAGNOSIS — E039 Hypothyroidism, unspecified: Secondary | ICD-10-CM | POA: Insufficient documentation

## 2019-05-10 DIAGNOSIS — Z87891 Personal history of nicotine dependence: Secondary | ICD-10-CM | POA: Diagnosis not present

## 2019-05-10 DIAGNOSIS — Z8249 Family history of ischemic heart disease and other diseases of the circulatory system: Secondary | ICD-10-CM | POA: Insufficient documentation

## 2019-05-10 DIAGNOSIS — N183 Chronic kidney disease, stage 3 unspecified: Secondary | ICD-10-CM | POA: Diagnosis not present

## 2019-05-10 DIAGNOSIS — I2510 Atherosclerotic heart disease of native coronary artery without angina pectoris: Secondary | ICD-10-CM | POA: Diagnosis not present

## 2019-05-10 DIAGNOSIS — I480 Paroxysmal atrial fibrillation: Secondary | ICD-10-CM | POA: Insufficient documentation

## 2019-05-10 DIAGNOSIS — G4733 Obstructive sleep apnea (adult) (pediatric): Secondary | ICD-10-CM | POA: Diagnosis not present

## 2019-05-10 DIAGNOSIS — I5032 Chronic diastolic (congestive) heart failure: Secondary | ICD-10-CM | POA: Insufficient documentation

## 2019-05-10 DIAGNOSIS — E785 Hyperlipidemia, unspecified: Secondary | ICD-10-CM | POA: Insufficient documentation

## 2019-05-10 LAB — BASIC METABOLIC PANEL
Anion gap: 13 (ref 5–15)
BUN: 16 mg/dL (ref 8–23)
CO2: 22 mmol/L (ref 22–32)
Calcium: 9.1 mg/dL (ref 8.9–10.3)
Chloride: 106 mmol/L (ref 98–111)
Creatinine, Ser: 1.15 mg/dL — ABNORMAL HIGH (ref 0.44–1.00)
GFR calc Af Amer: 54 mL/min — ABNORMAL LOW (ref >60)
GFR calc non Af Amer: 47 mL/min — ABNORMAL LOW (ref >60)
Glucose, Bld: 90 mg/dL (ref 70–99)
Potassium: 4.2 mmol/L (ref 3.5–5.1)
Sodium: 141 mmol/L (ref 135–145)

## 2019-05-10 LAB — LIPID PANEL
Cholesterol: 127 mg/dL (ref 0–200)
HDL: 57 mg/dL (ref >40)
LDL Cholesterol: 51 mg/dL (ref 0–99)
Total CHOL/HDL Ratio: 2.2 RATIO
Triglycerides: 95 mg/dL (ref <150)
VLDL: 19 mg/dL (ref 0–40)

## 2019-05-10 MED ORDER — OPSUMIT 10 MG PO TABS: 10.0000 mg | ORAL_TABLET | Freq: Every day | ORAL | 11 refills | Status: DC

## 2019-05-10 MED ORDER — UPTRAVI 1600 MCG PO TABS: 1.0000 | ORAL_TABLET | Freq: Two times a day (BID) | ORAL | 11 refills | Status: DC

## 2019-05-10 MED ORDER — ATORVASTATIN CALCIUM 80 MG PO TABS: 80.0000 mg | ORAL_TABLET | Freq: Every day | ORAL | 3 refills | Status: DC

## 2019-05-10 MED ORDER — POTASSIUM CHLORIDE CRYS ER 20 MEQ PO TBCR: 10.0000 meq | EXTENDED_RELEASE_TABLET | Freq: Every day | ORAL | 3 refills | Status: DC

## 2019-05-10 MED ORDER — APIXABAN 5 MG PO TABS: 5.0000 mg | ORAL_TABLET | Freq: Two times a day (BID) | ORAL | 3 refills | Status: DC

## 2019-05-10 MED ORDER — FUROSEMIDE 40 MG PO TABS: 40.0000 mg | ORAL_TABLET | Freq: Every day | ORAL | 3 refills | Status: DC

## 2019-05-10 MED ORDER — FUROSEMIDE 20 MG PO TABS: 20.0000 mg | ORAL_TABLET | Freq: Every day | ORAL | 3 refills | Status: DC

## 2019-05-10 NOTE — Telephone Encounter (Signed)
Received a Request from the Patient to send Medical Records from our office to Dr. Lane Hacker in [facility]. Faxed to [phone].

## 2019-05-10 NOTE — Progress Notes (Signed)
Follow up phone call completed with Amy [name] since pulmonary rehab suspension of in-person exercise classes.  Amy [name] was exercising on a home stationary bicycle, but had to stop for 2 weeks d/t knee pain.  First her right knee was swollen and painful and then her left knee was painful as well.  She did not ride the stationary bike for a week and they both improved.She states her blood pressure is low at times, systolic in the 72'T occasionally.  Discussed to drink water, wait 5-10 minutes, then recheck.   She has finalized a date of June 06, 2019 to move to [place] to be closer to her daughters. Will continue to follow with phone calls and monitor progress on the [employer] [facility] App.

## 2019-05-10 NOTE — Patient Instructions (Addendum)
ALL medications were sent for refill    INCREASE LASIX TO 40MG (1 TAB) DAILY   Labs today and repeat in 10 days Friday February 12th 10:30am We will only contact you if something comes back abnormal or we need to make some changes. Otherwise no news is good news!   Your physician recommends that you schedule a follow-up appointment in: as needed. Please call office at [phone] option 2 if you have any questions or concerns.    At the [facility], you and your health needs are our priority. As part of our continuing mission to provide you with exceptional heart care, we have created designated Provider Care Teams. These Care Teams include your primary Cardiologist (physician) and Advanced Practice Providers (APPs- Physician Assistants and Nurse Practitioners) who all work together to provide you with the care you need, when you need it.   You may see any of the following providers on your designated Care Team at your next follow up: Marland Kitchen Dr Glori Bickers . Dr [name] . Darrick Grinder, NP . Lyda Jester, PA . Audry Riles, PharmD   Please be sure to bring in all your medications bottles to every appointment.     Marland Kitchen

## 2019-05-11 NOTE — Progress Notes (Signed)
Date:  05/11/2019   ID:  [name], DOB Feb 01, 1946, MRN [id number]   Provider location: [facility] Type of Visit: Established patient   PCP:  Aretta Nip, MD  Cardiologist: Dr. Aundra Dubin  Chief Complaint: Shortness of breath.    History of Present Illness: Amy [name] is a 74 y.o. female with a history of COPD, OSA on CPAP, apical hypertrophic cardiomyopathy, pulmonary hypertension, chronic diastolic CHF, and paroxysmal atrial fibrillation.  She was admitted in 3/18 with CHF and dyspnea, found to have pulmonary arterial hypertension by RHC.  Prior to this, she has had a long history of PAF.  She is currently taking dronedarone but has had breakthrough fibrillation. She had an atrial fibrillation ablation in the past. She has apical hypertrophic cardiomyopathy proven by cardiac MRI with a consistent ECG.   She is on Opsumit and has started Selexipag, now titrated up to 1600 mcg bid. She was unable to tolerate Adcirca. Symptomatically, she has not seen significant improvement with selective pulmonary vasodilators to treat PAH.     I repeated her RHC in 3/19.  This showed lower PA pressure and normal right and left heart filling pressures, but more worrisomely, cardiac output was low.  However, on repeat of echo in 3/19, the RV actually looked normal in size and systolic function.  Given concern for possible PVOD as cause of her worsening symptoms on treatment, I ordered a high resolution CT chest.  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  I had her see Dr Lake Bells. He sent autoimmune serologies again, weak P-ANCA positivity so she was referred to rheumatology.  Echo bubble study was negative.  CCP and Ro-52 were positive.  Also of note, she has had atrial fibrillation ablation and pulmonary vein stenosis is a consideration.  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  She was started on prednisone and Cellcept.  Cellcept was stopped after GI bleed.  She was seen by rheumatology at [facility] and started on azathioprine.   In 9/19, she was admitted with a GI bleed.  Colonoscopy did not show a definite cause.  She was started back on Eliquis.   She was unable to tolerate Adempas so it was stopped (lightheadedness).    In 5/20, she was in the ER with atypical atrial flutter.  She was cardioverted and discharged home.   Echo in 7/20 showed EF 65-70% with an apical HCM pattern, normal RV size and systolic function, PASP 39 mmHg, normal IVC.   She was cardioverted again in 10/20 for atrial flutter with persistent rate in 120s.  She converted to NSR but it did not hold.  She saw Dr. Rayann Heman and decision was made to have her undergo AV nodal ablation with placement of [employer] PPM in 10/20.    She returns today for followup of CHF and pulmonary hypertension. She is now paced s/p her AV nodal ablation. She has been stable symptomatically.  Weight unchanged.  She takes Lasix 20 mg on most days. She is short of breath walking longer distances but does ok around the house. Wears home oxygen.  She has been doing pulmonary rehab.  She is able to drive herself.  No palpitations, rare lightheadedness.  No chest pain.   Of note, she plans to move back to [place] in the next month to  live near her daughters.  She has a cardiologist that she will see there, appt in March.   REDS clip 40%  ECG: Junctional rhythm with v-pacing (personally reviewed).   Labs (2/18): TSH normal Labs (3/18): K 3.8, creatinine 1.26, BNP 424 Labs (5/18): K 4.4, creatinine 1.96, anti-SCL 70 negative, RF very slightly elevated, HIV negative.  Labs (6/18): K 4, creatinine 1.2, LDL 49 Labs (9/18): BNP 149, K 4, creatinine 1.4, BNP 149 Labs (2/19): BNP 126, K 3.8, creatinine 1.5, CCP+, Ro-52+,  elevated CK.  Labs (7/19): BNP 676, K 4.7, creatinine 1.74 Labs (9/19): K 3.8, creatinine 1.39, BNP 220 Labs (11/19): K 3.5, creatinine 1.37, BNP 134 Labs (1/20): K 4.1, creatinine 1.18 Labs (4/20): K 4, creatinine 1.27, LFTs normal, hgb 11.7, plts 237 Labs (6/20): K 4.2, creatinine 1.16, hgb 11.6 Labs (10/20): K 4.8, creatinine 1.55 => 1.01, hgb 11.3  4/18 6 minute walk: 109 m  10/18 6 minute walk: 229 m 2/19 6 minute walk: < 100 m 5/19 6 minute walk: 107 m 1/20 6 minute walk: 188 m 7/20 6 minute walk: 171 m  PMH:  1. COPD: Quit smoking 2011.  - PFTs (3/18): minimal obstruction, minimal restriction, DLCO 22% (severely decreased).  2. Hypothyroidism 3. Hyperlipidemia 4. OSA: Uses CPAP 5. Apical hypertrophic cardiomyopathy:  - Cardiac MRI (7/16) with EF 79%, mid-apical LV hypertrophy with spade-like ventricle, mid-myocardial LGE was noted at the apex.   6. GERD 7. Atrial fibrillation and flutter: Paroxysmal.  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 Long QT interval so dofetilide and sotalol have not been used. She has had atrial fibrillation ablation.  - Currently on dronedarone.  - DCCV to NSR in ER 7/19.  - DCCV to NSR in 10/20 but did not hold, back in atypical flutter with rate in 120s.  - AV nodal ablation with MDT PPM placed in 10/20.  8. Coronary artery disease: LHC (7/16) with nonobstructive disease + 85% ostial stenosis small D1.  9. Diastolic CHF: Echo (8/92) with EF 65-70%, apical hypertrophic cardiomyopathy, severe LAE, PASP 86 mmHg.  - RHC (3/18): mean RA 4, PA 69/23 mean 38, mean PCWP 8, CI 2.17, PVR 7.3 WU Fick, 9.6 WU thermo.  - Echo (3/19): EF 65-70%, apical hypertrophic cardiomyopathy, normal RV size and systolic function, severe LAE.  - Echo (6/19): EF 65-70%, apical HCM, restrictive diastolic dysfunction, negative bubble study, PASP 42 mmHg.  10. Pulmonary hypertension: See RHC above.  - V/Q scan negative 3/18 - HIV, SCL70 negative.  RF borderline elevated, doubt  significant.  ANA negative.  - Unable to tolerate Adcirca - RHC (3/19): mean RA 6, PA 49/16 mean 29, mean PCWP 10, CI 1.9/PVR 5.3 WU Fick, CI 1.4/PVR 7.3 Thermo.  - High resolution CT chest (3/19): respiratory motion artifact present, but suspect mild emphysema with lungs grossly clear.  - P-ANCA+, CCP+, RO-52+, CK elevated.  - Echo 6/19 with negative bubble study.  - Unable to tolerate Adempas due to hypotension.  - Echo (7/20): EF 65-70% with an apical HCM pattern, normal RV size and systolic function, PASP 39 mmHg, normal IVC.  11. CPX (12/17): peak VO2 8.5, RER 0.96, VE/VCO2 slope 67. Submaximal but appears to show severe functional impairment.   12. CKD: Stage 3.  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  14. Gout  Current Outpatient Medications  Medication Sig Dispense Refill  . acetaminophen (TYLENOL) 325 MG tablet Take 2 tablets (650 mg total) by mouth every 4 (  four) hours as needed for headache or mild pain.    Marland Kitchen apixaban (ELIQUIS) 5 MG TABS tablet Take 1 tablet (5 mg total) by mouth 2 (two) times daily. Restart 02/04/19 180 tablet 3  . atorvastatin (LIPITOR) 80 MG tablet Take 1 tablet (80 mg total) by mouth daily with lunch. 90 tablet 3  . azaTHIOprine (IMURAN) 50 MG tablet Take 100 mg by mouth at bedtime.     . Cholecalciferol (VITAMIN D) 2000 UNITS CAPS Take 2,000 Units by mouth daily at 12 noon.     Marland Kitchen COLCRYS 0.6 MG tablet TAKE 1 TABLET BY MOUTH DAILY (Patient taking differently: Take 0.6 mg by mouth daily. ) 30 tablet 0  . ezetimibe (ZETIA) 10 MG tablet Take 10 mg by mouth daily.    . folic acid (FOLVITE) 1 MG tablet Take 1 mg by mouth daily in the afternoon. In the afternoon.    . furosemide (LASIX) 40 MG tablet Take 1 tablet (40 mg total) by mouth daily at 12 noon. 90 tablet 3  . HYDROcodone-acetaminophen (NORCO/VICODIN) 5-325 MG tablet Take 1 tablet by mouth every 6 (six) hours as needed for  moderate pain.    Marland Kitchen levothyroxine (SYNTHROID) 100 MCG tablet Take 100 mcg by mouth daily.     . macitentan (OPSUMIT) 10 MG tablet Take 1 tablet (10 mg total) by mouth daily. 30 tablet 11  . Multiple Vitamin (MULTIVITAMIN) capsule Take 1 capsule by mouth every evening.     . mupirocin nasal ointment (BACTROBAN) 2 % Place 1 application into the nose daily as needed (nose sores).    . neomycin-bacitracin-polymyxin (NEOSPORIN) ointment Apply 1 application topically as needed for wound care.    Marland Kitchen OVER THE COUNTER MEDICATION Take 1 tablet by mouth 2 (two) times daily as needed (cold symptoms). Counter-act otc cold med    . pantoprazole (PROTONIX) 40 MG tablet TAKE 1 TABLET BY MOUTH EVERY DAY 90 tablet 2  . potassium chloride SA (KLOR-CON) 20 MEQ tablet Take 0.5 tablets (10 mEq total) by mouth daily. 45 tablet 3  . UNABLE TO FIND Med Name: CPAP  DME-[facility]    . UPTRAVI 1600 MCG TABS Take 1 tablet (1,600 mcg total) by mouth 2 (two) times daily. 60 tablet 11  . vitamin B-12 (CYANOCOBALAMIN) 1000 MCG tablet Take 1,000 mcg by mouth daily in the afternoon.      No current facility-administered medications for this encounter.    Allergies:   Amiodarone, Cellcept [mycophenolate mofetil], and Zyban [bupropion]   Social History:  The patient  reports that she quit smoking about 10 years ago. Her smoking use included cigarettes. She has a 22.00 pack-year smoking history. She has never used smokeless tobacco. She reports that she does not drink alcohol or use drugs.   Family History:  The patient's family history includes Breast cancer in her paternal aunt; Gout in her father; Heart disease in her sister; Lung cancer in her father.   ROS:  Please see the history of present illness.   All other systems are personally reviewed and negative.   Exam:   BP 104/60   Pulse 80   Wt 87.4 kg (192 lb 9.6 oz)   SpO2 94%   BMI 34.12 kg/m  General: NAD Neck: JVP 8 cm, no thyromegaly or thyroid nodule.  Lungs: Clear  to auscultation bilaterally with normal respiratory effort. CV: Nondisplaced PMI.  Heart regular S1/S2, no S3/S4, no murmur.  No peripheral edema.  No carotid bruit.  Normal pedal pulses.  Abdomen: Soft, nontender, no hepatosplenomegaly, no distention.  Skin: Intact without lesions or rashes.  Neurologic: Alert and oriented x 3.  Psych: Normal affect. Extremities: No clubbing or cyanosis.  HEENT: Normal.   Recent Labs: 09/15/2018: ALT 19 10/14/2018: B Natriuretic Peptide 188.3 01/25/2019: Hemoglobin 11.3; Platelets 303 05/10/2019: BUN 16; Creatinine, Ser 1.15; Potassium 4.2; Sodium 141  Personally reviewed   Wt Readings from Last 3 Encounters:  05/10/19 87.4 kg (192 lb 9.6 oz)  04/05/19 85.5 kg (188 lb 7.9 oz)  03/29/19 86.9 kg (191 lb 9.3 oz)      ASSESSMENT AND PLAN:  1. Chronic diastolic CHF: Echo in 3/15 showed EF 65-70% in apical HCM pattern, normal RV size and systolic function.  NYHA class III symptoms, due to combination of lung disease and CHF.  REDS clip 40% today.  Exam also suggestive of at least mild volume overload.  - Increase Lasix to 40 mg daily, BMET today and in 10 days.  2. Apical hypertrophic cardiomyopathy: Cardiac MRI in 7/16 showed mid-apical LV hypertrophy with vigorous contraction.  ECG is consistent with apical HCM.   3. Atrial fibrillation/atypical atrial flutter:  Per Dr. Rayann Heman, not candidate for redo ablation. Thought to have had amiodarone toxicity and QTc too long for dofetilide or sotalol. Breakthrough atrial fibrillation in 7/19, DCCV back to NSR in ER.  Breakthrough atypical atrial flutter in 5/20, DCCV back to NSR in the ER.  Breakthrough atypical atrial flutter with RVR in 10/20, DCCV to NSR but did not hold NSR.  In 10/20, she had AV nodal ablation with MDT PPM placed.  - Continue Eliquis.  No further overt GI bleeding.  4. Pulmonary hypertension: RHC in 3/18 showed moderate pulmonary arterial hypertension with elevated PVR (7.3 WU by Fick, 9.6 WU by  thermo). She has COPD but I do not think this plays a large role in the pulmonary hypertension =>  PFTs in 3/18 showed minimal obstruction and restriction, there was significantly decreased DLCO, this may be due to pulmonary vascular disease.  OSA may play a role but not a large one. V/Q scan did not suggest chronic PEs in 3/18.  RF borderline elevated (probably not significant), HIV negative, Anti-SCL70 negative, ANA negative, P-ANCA weakly positive.  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  Her RHC in 3/19 showed lower PA pressure and PVR with normal right and left heart filling pressures, but cardiac output was lower than prior.  Surprisingly, her RV looked normal on echo so I question how accurate the cardiac output measurement really was.  Bubble study negative on echo.  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  Echo in 7/20 showed normal RV size and systolic function and PASP 39 mmHg.  - Also of note, she has a history of afib ablation so pulmonary vein stenosis certainly remains a concern for raising PA pressure, but I think this is unlikely as the cause for her presentation.   - Continue Opsumit.     - She did not tolerate Adcirca or Adempas.   - Continue Selexipag 1600 bid.  - She is doing pulmonary rehab.  5. Anti-synthetase syndrome: Elevated CK, positive myositis panel, nonspecific interstitial pneumonia. Being seen by [facility]ology at [facility], on  azathioprine now.   6. Hyperlipidemia: Check lipids today.   She is moving to [place], has established cardiology followup there.   Signed, [name], MD  05/11/2019  [place] [address] [facility]  [place] [address] [phone] (office) [phone] (fax)

## 2019-05-19 ENCOUNTER — Telehealth (HOSPITAL_COMMUNITY): Payer: Self-pay | Admitting: Cardiology

## 2019-05-20 ENCOUNTER — Other Ambulatory Visit: Payer: Self-pay

## 2019-05-20 ENCOUNTER — Other Ambulatory Visit (HOSPITAL_COMMUNITY): Payer: Self-pay

## 2019-05-20 ENCOUNTER — Ambulatory Visit (HOSPITAL_COMMUNITY)
Admission: RE | Admit: 2019-05-20 | Discharge: 2019-05-20 | Disposition: A | Discharge status: Home / Self Care | Payer: Medicare Other | Source: Ambulatory Visit | Attending: Internal Medicine | Admitting: Internal Medicine

## 2019-05-20 DIAGNOSIS — I5032 Chronic diastolic (congestive) heart failure: Secondary | ICD-10-CM

## 2019-05-20 LAB — BASIC METABOLIC PANEL
Anion gap: 11 (ref 5–15)
BUN: 30 mg/dL — ABNORMAL HIGH (ref 8–23)
CO2: 24 mmol/L (ref 22–32)
Calcium: 9.2 mg/dL (ref 8.9–10.3)
Chloride: 106 mmol/L (ref 98–111)
Creatinine, Ser: 1.48 mg/dL — ABNORMAL HIGH (ref 0.44–1.00)
GFR calc Af Amer: 40 mL/min — ABNORMAL LOW (ref >60)
GFR calc non Af Amer: 35 mL/min — ABNORMAL LOW (ref >60)
Glucose, Bld: 115 mg/dL — ABNORMAL HIGH (ref 70–99)
Potassium: 4.1 mmol/L (ref 3.5–5.1)
Sodium: 141 mmol/L (ref 135–145)

## 2019-05-27 ENCOUNTER — Encounter: Payer: Medicare Other | Admitting: Student

## 2019-05-28 ENCOUNTER — Ambulatory Visit: Payer: Medicare Other

## 2019-05-31 NOTE — Progress Notes (Signed)
Discharge Progress Report  Patient Details  Name: Amy [name] MRN: [id number] Date of Birth: 10-20-1945 Referring Provider:     Pulmonary Rehab Walk Test from 03/29/2019 in [facility]  Referring Provider  Dr. Aundra Dubin       Number of Visits: 2  Reason for Discharge:  Early Exit:  Patient moved to [place]  Smoking History:  Social History   Tobacco Use  Smoking Status Former Smoker  . Packs/day: 0.50  . Years: 44.00  . Pack years: 22.00  . Types: Cigarettes  . Quit date: 04/07/2009  . Years since quitting: 10.1  Smokeless Tobacco Never Used    Diagnosis:  Heart failure, diastolic, chronic ([place])  ADL UCSD: Pulmonary Assessment Scores    Row Name 03/29/19 1514         ADL UCSD   ADL Phase  Entry     SOB Score total  68       CAT Score   CAT Score  15        Initial Exercise Prescription: Initial Exercise Prescription - 03/29/19 1500      Date of Initial Exercise RX and Referring Provider   Date  03/29/19    Referring Provider  Dr. Aundra Dubin      Oxygen   Oxygen  Continuous    Liters  10      Recumbant Bike   Level  1    Minutes  15      NuStep   Level  2    SPM  80    Minutes  15      Prescription Details   Frequency (times per week)  2    Duration  Progress to 30 minutes of continuous aerobic without signs/symptoms of physical distress      Intensity   THRR 40-80% of Max Heartrate  59-118    Ratings of Perceived Exertion  11-13    Perceived Dyspnea  0-4      Progression   Progression  Continue progressive overload as per policy without signs/symptoms or physical distress.      Resistance Training   Training Prescription  Yes    Weight  Orange bands    Reps  10-15       Discharge Exercise Prescription (Final Exercise Prescription Changes): Exercise Prescription Changes - 04/05/19 1500      Response to Exercise   Blood Pressure (Admit)  104/60    Blood Pressure (Exercise)  130/68    Blood  Pressure (Exit)  106/66    Heart Rate (Admit)  79 bpm    Heart Rate (Exercise)  80 bpm    Heart Rate (Exit)  83 bpm    Oxygen Saturation (Admit)  88 %    Oxygen Saturation (Exercise)  90 %    Oxygen Saturation (Exit)  98 %    Rating of Perceived Exertion (Exercise)  11    Perceived Dyspnea (Exercise)  1    Duration  Progress to 30 minutes of  aerobic without signs/symptoms of physical distress    Intensity  Other (comment)      Progression   Progression  --   40-80% of HRR     Resistance Training   Training Prescription  Yes    Weight  orange bands    Reps  10-15    Time  10 Minutes      Oxygen   Oxygen  Continuous    Liters  8  Recumbant Bike   Level  1    Minutes  15      NuStep   Level  2    SPM  80    Minutes  15    METs  1.3       Functional Capacity: 6 Minute Walk    Row Name 03/29/19 1541         6 Minute Walk   Distance  400 feet     Walk Time  4 minutes     # of Rest Breaks  3     MPH  0.76     METS  0.74     RPE  13     Perceived Dyspnea   3     VO2 Peak  2.59     Symptoms  Yes (comment)     Comments  pushed wheel chair. 3 standing breaks due to desaturation     Resting HR  76 bpm     Resting BP  108/64     Resting Oxygen Saturation   93 %     Exercise Oxygen Saturation  during 6 min walk  77 %     Max Ex. HR  105 bpm     Max Ex. BP  122/68     2 Minute Post BP  104/68       Interval HR   1 Minute HR  91     2 Minute HR  95     3 Minute HR  90     4 Minute HR  87     5 Minute HR  90     6 Minute HR  105     2 Minute Post HR  88     Interval Heart Rate?  Yes       Interval Oxygen   Interval Oxygen?  Yes     Baseline Oxygen Saturation %  93 %     1 Minute Oxygen Saturation %  90 %     1 Minute Liters of Oxygen  4 L     2 Minute Oxygen Saturation %  84 %     2 Minute Liters of Oxygen  4 L     3 Minute Oxygen Saturation %  91 %     3 Minute Liters of Oxygen  6 L     4 Minute Oxygen Saturation %  84 %     4 Minute Liters of  Oxygen  6 L     5 Minute Oxygen Saturation %  77 %     5 Minute Liters of Oxygen  8 L     6 Minute Oxygen Saturation %  94 %     6 Minute Liters of Oxygen  10 L     2 Minute Post Oxygen Saturation %  96 %     2 Minute Post Liters of Oxygen  4 L        Psychological, QOL, Others - Outcomes: PHQ 2/9: Depression screen [facility] 2/9 03/29/2019 12/21/2017 12/17/2017 09/16/2017 05/07/2017  Decreased Interest 0 0 0 1 1  Down, Depressed, Hopeless 0 0 0 1 1  PHQ - 2 Score 0 0 0 2 2  Altered sleeping 0 - - - 3  Tired, decreased energy 1 - - - 3  Change in appetite 0 - - - 0  Feeling bad or failure about yourself  0 - - - 1  Trouble concentrating 0 - - -  0  Moving slowly or fidgety/restless 0 - - - 0  Suicidal thoughts 0 - - - 1  PHQ-9 Score - - - - 10  Difficult doing work/chores Not difficult at all - - - Not difficult at all  Some recent data might be hidden    Quality of Life:   Personal Goals: Goals established at orientation with interventions provided to work toward goal. Personal Goals and Risk Factors at Admission - 03/29/19 1519      Core Components/Risk Factors/Patient Goals on Admission   Heart Failure  Yes        Personal Goals Discharge: Goals and Risk Factor Review    Row Name 03/29/19 1519             Core Components/Risk Factors/Patient Goals Review   Personal Goals Review  Develop more efficient breathing techniques such as purse lipped breathing and diaphragmatic breathing and practicing self-pacing with activity.;Increase knowledge of respiratory medications and ability to use respiratory devices properly.;Improve shortness of breath with ADL's;Heart Failure          Exercise Goals and Review: Exercise Goals    Row Name 03/29/19 1548             Exercise Goals   Increase Physical Activity  Yes       Intervention  Provide advice, education, support and counseling about physical activity/exercise needs.;Develop an individualized exercise prescription for  aerobic and resistive training based on initial evaluation findings, risk stratification, comorbidities and participant's personal goals.       Expected Outcomes  Short Term: Attend rehab on a regular basis to increase amount of physical activity.;Long Term: Add in home exercise to make exercise part of routine and to increase amount of physical activity.;Long Term: Exercising regularly at least 3-5 days a week.       Increase Strength and Stamina  Yes       Intervention  Provide advice, education, support and counseling about physical activity/exercise needs.;Develop an individualized exercise prescription for aerobic and resistive training based on initial evaluation findings, risk stratification, comorbidities and participant's personal goals.       Expected Outcomes  Short Term: Increase workloads from initial exercise prescription for resistance, speed, and METs.;Short Term: Perform resistance training exercises routinely during rehab and add in resistance training at home;Long Term: Improve cardiorespiratory fitness, muscular endurance and strength as measured by increased METs and functional capacity (6MWT)       Able to understand and use rate of perceived exertion (RPE) scale  Yes       Intervention  Provide education and explanation on how to use RPE scale       Expected Outcomes  Short Term: Able to use RPE daily in rehab to express subjective intensity level;Long Term:  Able to use RPE to guide intensity level when exercising independently       Able to understand and use Dyspnea scale  Yes       Intervention  Provide education and explanation on how to use Dyspnea scale       Expected Outcomes  Short Term: Able to use Dyspnea scale daily in rehab to express subjective sense of shortness of breath during exertion;Long Term: Able to use Dyspnea scale to guide intensity level when exercising independently       Knowledge and understanding of Target Heart Rate Range (THRR)  Yes        Intervention  Provide education and explanation of THRR including how the numbers were  predicted and where they are located for reference       Expected Outcomes  Short Term: Able to state/look up THRR;Short Term: Able to use daily as guideline for intensity in rehab;Long Term: Able to use THRR to govern intensity when exercising independently       Understanding of Exercise Prescription  Yes       Intervention  Provide education, explanation, and written materials on patient's individual exercise prescription       Expected Outcomes  Short Term: Able to explain program exercise prescription;Long Term: Able to explain home exercise prescription to exercise independently          Exercise Goals Re-Evaluation:   Nutrition & Weight - Outcomes: Pre Biometrics - 03/29/19 1504      Pre Biometrics   Grip Strength  24 kg        Nutrition: Nutrition Therapy & Goals - 04/07/19 1354      Nutrition Therapy   Diet  Therapeutic Lifestyle Changes      Personal Nutrition Goals   Nutrition Goal  Identify food quantities necessary to achieve wt loss of  -2# per week to a goal wt loss of 2.7-10.9 kg (6-24 lb) at graduation from pulmonary rehab.      Intervention Plan   Intervention  Prescribe, educate and counsel regarding individualized specific dietary modifications aiming towards targeted core components such as weight, hypertension, lipid management, diabetes, heart failure and other comorbidities.;Nutrition handout(s) given to patient.    Expected Outcomes  Short Term Goal: Understand basic principles of dietary content, such as calories, fat, sodium, cholesterol and nutrients.;Long Term Goal: Adherence to prescribed nutrition plan.       Nutrition Discharge: Nutrition Assessments - 04/07/19 1401      Rate Your Plate Scores   Pre Score  51       Education Questionnaire Score: Knowledge Questionnaire Score - 03/29/19 1517      Knowledge Questionnaire Score   Pre Score  16/1/8        Goals reviewed with patient; copy given to patient.

## 2019-05-31 NOTE — Addendum Note (Signed)
Encounter addended by: Lance Morin, RN on: 05/31/2019 3:41 PM  Actions taken: Clinical Note Signed, Episode resolved

## 2019-06-06 DIAGNOSIS — U071 COVID-19: Secondary | ICD-10-CM

## 2019-06-06 HISTORY — DX: COVID-19: U07.1

## 2019-06-09 DIAGNOSIS — Z950 Presence of cardiac pacemaker: Secondary | ICD-10-CM | POA: Diagnosis not present

## 2019-06-09 DIAGNOSIS — Z9981 Dependence on supplemental oxygen: Secondary | ICD-10-CM | POA: Diagnosis not present

## 2019-06-09 DIAGNOSIS — I272 Pulmonary hypertension, unspecified: Secondary | ICD-10-CM | POA: Diagnosis not present

## 2019-06-19 DIAGNOSIS — Z20828 Contact with and (suspected) exposure to other viral communicable diseases: Secondary | ICD-10-CM | POA: Diagnosis not present

## 2019-06-22 DIAGNOSIS — R778 Other specified abnormalities of plasma proteins: Secondary | ICD-10-CM | POA: Diagnosis not present

## 2019-06-22 DIAGNOSIS — J849 Interstitial pulmonary disease, unspecified: Secondary | ICD-10-CM | POA: Diagnosis not present

## 2019-06-22 DIAGNOSIS — E871 Hypo-osmolality and hyponatremia: Secondary | ICD-10-CM | POA: Diagnosis present

## 2019-06-22 DIAGNOSIS — I509 Heart failure, unspecified: Secondary | ICD-10-CM | POA: Diagnosis present

## 2019-06-22 DIAGNOSIS — I4892 Unspecified atrial flutter: Secondary | ICD-10-CM | POA: Diagnosis not present

## 2019-06-22 DIAGNOSIS — D8989 Other specified disorders involving the immune mechanism, not elsewhere classified: Secondary | ICD-10-CM | POA: Diagnosis not present

## 2019-06-22 DIAGNOSIS — Z9981 Dependence on supplemental oxygen: Secondary | ICD-10-CM | POA: Diagnosis not present

## 2019-06-22 DIAGNOSIS — E872 Acidosis: Secondary | ICD-10-CM | POA: Diagnosis not present

## 2019-06-22 DIAGNOSIS — R9431 Abnormal electrocardiogram [ECG] [EKG]: Secondary | ICD-10-CM | POA: Diagnosis not present

## 2019-06-22 DIAGNOSIS — Z950 Presence of cardiac pacemaker: Secondary | ICD-10-CM | POA: Diagnosis not present

## 2019-06-22 DIAGNOSIS — E860 Dehydration: Secondary | ICD-10-CM | POA: Diagnosis present

## 2019-06-22 DIAGNOSIS — A4189 Other specified sepsis: Secondary | ICD-10-CM | POA: Diagnosis not present

## 2019-06-22 DIAGNOSIS — I2510 Atherosclerotic heart disease of native coronary artery without angina pectoris: Secondary | ICD-10-CM | POA: Diagnosis not present

## 2019-06-22 DIAGNOSIS — R0902 Hypoxemia: Secondary | ICD-10-CM | POA: Diagnosis not present

## 2019-06-22 DIAGNOSIS — I4811 Longstanding persistent atrial fibrillation: Secondary | ICD-10-CM | POA: Diagnosis not present

## 2019-06-22 DIAGNOSIS — J440 Chronic obstructive pulmonary disease with acute lower respiratory infection: Secondary | ICD-10-CM | POA: Diagnosis not present

## 2019-06-22 DIAGNOSIS — Z7901 Long term (current) use of anticoagulants: Secondary | ICD-10-CM | POA: Diagnosis not present

## 2019-06-22 DIAGNOSIS — N183 Chronic kidney disease, stage 3 unspecified: Secondary | ICD-10-CM | POA: Diagnosis not present

## 2019-06-22 DIAGNOSIS — R918 Other nonspecific abnormal finding of lung field: Secondary | ICD-10-CM | POA: Diagnosis not present

## 2019-06-22 DIAGNOSIS — I248 Other forms of acute ischemic heart disease: Secondary | ICD-10-CM | POA: Diagnosis present

## 2019-06-22 DIAGNOSIS — E039 Hypothyroidism, unspecified: Secondary | ICD-10-CM | POA: Diagnosis not present

## 2019-06-22 DIAGNOSIS — N179 Acute kidney failure, unspecified: Secondary | ICD-10-CM | POA: Diagnosis not present

## 2019-06-22 DIAGNOSIS — R0602 Shortness of breath: Secondary | ICD-10-CM | POA: Diagnosis not present

## 2019-06-22 DIAGNOSIS — A419 Sepsis, unspecified organism: Secondary | ICD-10-CM | POA: Diagnosis not present

## 2019-06-22 DIAGNOSIS — R05 Cough: Secondary | ICD-10-CM | POA: Diagnosis not present

## 2019-06-22 DIAGNOSIS — I272 Pulmonary hypertension, unspecified: Secondary | ICD-10-CM | POA: Diagnosis not present

## 2019-06-22 DIAGNOSIS — J984 Other disorders of lung: Secondary | ICD-10-CM | POA: Diagnosis not present

## 2019-06-22 DIAGNOSIS — J449 Chronic obstructive pulmonary disease, unspecified: Secondary | ICD-10-CM | POA: Diagnosis not present

## 2019-06-22 DIAGNOSIS — M3589 Other specified systemic involvement of connective tissue: Secondary | ICD-10-CM | POA: Diagnosis present

## 2019-06-22 DIAGNOSIS — I4891 Unspecified atrial fibrillation: Secondary | ICD-10-CM | POA: Diagnosis present

## 2019-06-22 DIAGNOSIS — I517 Cardiomegaly: Secondary | ICD-10-CM | POA: Diagnosis not present

## 2019-06-22 DIAGNOSIS — R509 Fever, unspecified: Secondary | ICD-10-CM | POA: Diagnosis not present

## 2019-06-22 DIAGNOSIS — U071 COVID-19: Secondary | ICD-10-CM | POA: Diagnosis not present

## 2019-06-22 DIAGNOSIS — Z743 Need for continuous supervision: Secondary | ICD-10-CM | POA: Diagnosis not present

## 2019-06-22 DIAGNOSIS — N281 Cyst of kidney, acquired: Secondary | ICD-10-CM | POA: Diagnosis not present

## 2019-06-22 DIAGNOSIS — J9621 Acute and chronic respiratory failure with hypoxia: Secondary | ICD-10-CM | POA: Diagnosis not present

## 2019-06-22 DIAGNOSIS — J1282 Pneumonia due to coronavirus disease 2019: Secondary | ICD-10-CM | POA: Diagnosis not present

## 2019-06-22 DIAGNOSIS — R7989 Other specified abnormal findings of blood chemistry: Secondary | ICD-10-CM | POA: Diagnosis not present

## 2019-06-23 ENCOUNTER — Telehealth (HOSPITAL_COMMUNITY): Payer: Self-pay

## 2019-06-23 DIAGNOSIS — R918 Other nonspecific abnormal finding of lung field: Secondary | ICD-10-CM | POA: Diagnosis not present

## 2019-06-23 DIAGNOSIS — I2510 Atherosclerotic heart disease of native coronary artery without angina pectoris: Secondary | ICD-10-CM | POA: Diagnosis not present

## 2019-06-23 DIAGNOSIS — R439 Unspecified disturbances of smell and taste: Secondary | ICD-10-CM | POA: Diagnosis present

## 2019-06-23 DIAGNOSIS — E039 Hypothyroidism, unspecified: Secondary | ICD-10-CM | POA: Diagnosis not present

## 2019-06-23 DIAGNOSIS — J432 Centrilobular emphysema: Secondary | ICD-10-CM | POA: Diagnosis not present

## 2019-06-23 DIAGNOSIS — E669 Obesity, unspecified: Secondary | ICD-10-CM | POA: Diagnosis present

## 2019-06-23 DIAGNOSIS — J849 Interstitial pulmonary disease, unspecified: Secondary | ICD-10-CM | POA: Diagnosis not present

## 2019-06-23 DIAGNOSIS — J129 Viral pneumonia, unspecified: Secondary | ICD-10-CM | POA: Diagnosis not present

## 2019-06-23 DIAGNOSIS — Z950 Presence of cardiac pacemaker: Secondary | ICD-10-CM | POA: Diagnosis not present

## 2019-06-23 DIAGNOSIS — R6520 Severe sepsis without septic shock: Secondary | ICD-10-CM | POA: Diagnosis present

## 2019-06-23 DIAGNOSIS — Z923 Personal history of irradiation: Secondary | ICD-10-CM | POA: Diagnosis not present

## 2019-06-23 DIAGNOSIS — K529 Noninfective gastroenteritis and colitis, unspecified: Secondary | ICD-10-CM | POA: Diagnosis present

## 2019-06-23 DIAGNOSIS — Z23 Encounter for immunization: Secondary | ICD-10-CM | POA: Diagnosis not present

## 2019-06-23 DIAGNOSIS — I272 Pulmonary hypertension, unspecified: Secondary | ICD-10-CM | POA: Diagnosis not present

## 2019-06-23 DIAGNOSIS — Z6831 Body mass index (BMI) 31.0-31.9, adult: Secondary | ICD-10-CM | POA: Diagnosis not present

## 2019-06-23 DIAGNOSIS — I130 Hypertensive heart and chronic kidney disease with heart failure and stage 1 through stage 4 chronic kidney disease, or unspecified chronic kidney disease: Secondary | ICD-10-CM | POA: Diagnosis present

## 2019-06-23 DIAGNOSIS — J440 Chronic obstructive pulmonary disease with acute lower respiratory infection: Secondary | ICD-10-CM | POA: Diagnosis present

## 2019-06-23 DIAGNOSIS — R945 Abnormal results of liver function studies: Secondary | ICD-10-CM | POA: Diagnosis present

## 2019-06-23 DIAGNOSIS — R590 Localized enlarged lymph nodes: Secondary | ICD-10-CM | POA: Diagnosis not present

## 2019-06-23 DIAGNOSIS — J8489 Other specified interstitial pulmonary diseases: Secondary | ICD-10-CM | POA: Diagnosis present

## 2019-06-23 DIAGNOSIS — I2723 Pulmonary hypertension due to lung diseases and hypoxia: Secondary | ICD-10-CM | POA: Diagnosis present

## 2019-06-23 DIAGNOSIS — A4189 Other specified sepsis: Secondary | ICD-10-CM | POA: Diagnosis present

## 2019-06-23 DIAGNOSIS — Z9981 Dependence on supplemental oxygen: Secondary | ICD-10-CM | POA: Diagnosis not present

## 2019-06-23 DIAGNOSIS — D8481 Immunodeficiency due to conditions classified elsewhere: Secondary | ICD-10-CM | POA: Diagnosis present

## 2019-06-23 DIAGNOSIS — U071 COVID-19: Secondary | ICD-10-CM | POA: Diagnosis not present

## 2019-06-23 DIAGNOSIS — J9621 Acute and chronic respiratory failure with hypoxia: Secondary | ICD-10-CM | POA: Diagnosis not present

## 2019-06-23 DIAGNOSIS — Z7901 Long term (current) use of anticoagulants: Secondary | ICD-10-CM | POA: Diagnosis not present

## 2019-06-23 DIAGNOSIS — D72810 Lymphocytopenia: Secondary | ICD-10-CM | POA: Diagnosis present

## 2019-06-23 DIAGNOSIS — N183 Chronic kidney disease, stage 3 unspecified: Secondary | ICD-10-CM | POA: Diagnosis present

## 2019-06-23 DIAGNOSIS — J1282 Pneumonia due to coronavirus disease 2019: Secondary | ICD-10-CM | POA: Diagnosis present

## 2019-06-23 DIAGNOSIS — I5033 Acute on chronic diastolic (congestive) heart failure: Secondary | ICD-10-CM | POA: Diagnosis present

## 2019-06-23 DIAGNOSIS — I4892 Unspecified atrial flutter: Secondary | ICD-10-CM | POA: Diagnosis not present

## 2019-06-23 DIAGNOSIS — Z888 Allergy status to other drugs, medicaments and biological substances status: Secondary | ICD-10-CM | POA: Diagnosis not present

## 2019-06-23 NOTE — Telephone Encounter (Signed)
Kenney Houseman, NP from [facility] called regarding patient being admitted there. They are attempting to treat patient however patient does not have any records of her medical history.  She is currently covid positive and not on her [facility] medications because they do not have her med records.  They called [facility] to get her med list and was able to obtain those.  She requested her medical records from [name] to guide treatment.  Pertinent medical records faxed via Epic to [phone].  Her number is [phone]

## 2019-07-04 DIAGNOSIS — I272 Pulmonary hypertension, unspecified: Secondary | ICD-10-CM | POA: Diagnosis not present

## 2019-07-04 DIAGNOSIS — U071 COVID-19: Secondary | ICD-10-CM | POA: Diagnosis not present

## 2019-07-04 DIAGNOSIS — J189 Pneumonia, unspecified organism: Secondary | ICD-10-CM | POA: Diagnosis not present

## 2019-07-06 ENCOUNTER — Other Ambulatory Visit (HOSPITAL_COMMUNITY): Payer: Self-pay | Admitting: Cardiology

## 2019-07-12 DIAGNOSIS — I272 Pulmonary hypertension, unspecified: Secondary | ICD-10-CM | POA: Diagnosis not present

## 2019-07-12 DIAGNOSIS — Z9981 Dependence on supplemental oxygen: Secondary | ICD-10-CM | POA: Diagnosis not present

## 2019-07-12 DIAGNOSIS — N183 Chronic kidney disease, stage 3 unspecified: Secondary | ICD-10-CM | POA: Diagnosis not present

## 2019-07-12 DIAGNOSIS — Z950 Presence of cardiac pacemaker: Secondary | ICD-10-CM | POA: Diagnosis not present

## 2019-07-12 DIAGNOSIS — I509 Heart failure, unspecified: Secondary | ICD-10-CM | POA: Diagnosis not present

## 2019-07-12 DIAGNOSIS — J849 Interstitial pulmonary disease, unspecified: Secondary | ICD-10-CM | POA: Diagnosis not present

## 2019-07-12 DIAGNOSIS — J9621 Acute and chronic respiratory failure with hypoxia: Secondary | ICD-10-CM | POA: Diagnosis not present

## 2019-07-12 DIAGNOSIS — E669 Obesity, unspecified: Secondary | ICD-10-CM | POA: Diagnosis not present

## 2019-07-12 DIAGNOSIS — J449 Chronic obstructive pulmonary disease, unspecified: Secondary | ICD-10-CM | POA: Diagnosis not present

## 2019-07-12 DIAGNOSIS — Z7901 Long term (current) use of anticoagulants: Secondary | ICD-10-CM | POA: Diagnosis not present

## 2019-07-12 DIAGNOSIS — M3581 Multisystem inflammatory syndrome: Secondary | ICD-10-CM | POA: Diagnosis not present

## 2019-07-12 DIAGNOSIS — I130 Hypertensive heart and chronic kidney disease with heart failure and stage 1 through stage 4 chronic kidney disease, or unspecified chronic kidney disease: Secondary | ICD-10-CM | POA: Diagnosis not present

## 2019-07-12 DIAGNOSIS — Z8701 Personal history of pneumonia (recurrent): Secondary | ICD-10-CM | POA: Diagnosis not present

## 2019-07-12 DIAGNOSIS — I4891 Unspecified atrial fibrillation: Secondary | ICD-10-CM | POA: Diagnosis not present

## 2019-07-12 DIAGNOSIS — U071 COVID-19: Secondary | ICD-10-CM | POA: Diagnosis not present

## 2019-07-12 DIAGNOSIS — E039 Hypothyroidism, unspecified: Secondary | ICD-10-CM | POA: Diagnosis not present

## 2019-07-13 DIAGNOSIS — Z79899 Other long term (current) drug therapy: Secondary | ICD-10-CM | POA: Diagnosis not present

## 2019-07-13 DIAGNOSIS — R0902 Hypoxemia: Secondary | ICD-10-CM | POA: Diagnosis not present

## 2019-07-13 DIAGNOSIS — I270 Primary pulmonary hypertension: Secondary | ICD-10-CM | POA: Diagnosis not present

## 2019-07-13 DIAGNOSIS — I272 Pulmonary hypertension, unspecified: Secondary | ICD-10-CM | POA: Diagnosis not present

## 2019-07-13 DIAGNOSIS — D8989 Other specified disorders involving the immune mechanism, not elsewhere classified: Secondary | ICD-10-CM | POA: Diagnosis not present

## 2019-07-13 DIAGNOSIS — R609 Edema, unspecified: Secondary | ICD-10-CM | POA: Diagnosis not present

## 2019-07-14 ENCOUNTER — Telehealth (HOSPITAL_COMMUNITY): Payer: Self-pay | Admitting: Pharmacist

## 2019-07-14 NOTE — Telephone Encounter (Signed)
Received fax from [employer] that PA for Opsumit was required. After submitting, received fax from [place] that [place] is not needed at this time.  Audry Riles, PharmD, BCPS, BCCP, CPP Heart Failure Clinic Pharmacist [phone]

## 2019-07-15 DIAGNOSIS — I272 Pulmonary hypertension, unspecified: Secondary | ICD-10-CM | POA: Diagnosis not present

## 2019-07-15 DIAGNOSIS — R0600 Dyspnea, unspecified: Secondary | ICD-10-CM | POA: Diagnosis not present

## 2019-07-15 DIAGNOSIS — R609 Edema, unspecified: Secondary | ICD-10-CM | POA: Diagnosis not present

## 2019-07-15 DIAGNOSIS — R5383 Other fatigue: Secondary | ICD-10-CM | POA: Diagnosis not present

## 2019-08-03 ENCOUNTER — Ambulatory Visit (INDEPENDENT_AMBULATORY_CARE_PROVIDER_SITE_OTHER): Payer: Medicare Other | Admitting: *Deleted

## 2019-08-03 DIAGNOSIS — Z950 Presence of cardiac pacemaker: Secondary | ICD-10-CM

## 2019-08-03 LAB — CUP PACEART REMOTE DEVICE CHECK
Battery Remaining Longevity: 141 mo
Battery Voltage: 3.12 V
Brady Statistic RV Percent Paced: 99.85 %
Date Time Interrogation Session: 20210427231805
Implantable Lead Implant Date: 20201026
Implantable Lead Location: 753862
Implantable Lead Model: 3830
Implantable Pulse Generator Implant Date: 20201026
Lead Channel Impedance Value: 399 Ohm
Lead Channel Impedance Value: 532 Ohm
Lead Channel Pacing Threshold Amplitude: 0.625 V
Lead Channel Pacing Threshold Pulse Width: 0.4 ms
Lead Channel Sensing Intrinsic Amplitude: 22.375 mV
Lead Channel Sensing Intrinsic Amplitude: 22.375 mV
Lead Channel Setting Pacing Amplitude: 2.5 V
Lead Channel Setting Pacing Pulse Width: 0.4 ms
Lead Channel Setting Sensing Sensitivity: 4 mV

## 2019-08-04 NOTE — Progress Notes (Signed)
PPM Remote

## 2019-08-09 DIAGNOSIS — Z79899 Other long term (current) drug therapy: Secondary | ICD-10-CM | POA: Diagnosis not present

## 2019-08-09 DIAGNOSIS — D8989 Other specified disorders involving the immune mechanism, not elsewhere classified: Secondary | ICD-10-CM | POA: Diagnosis not present

## 2019-08-09 DIAGNOSIS — I272 Pulmonary hypertension, unspecified: Secondary | ICD-10-CM | POA: Diagnosis not present

## 2019-08-09 DIAGNOSIS — Z5181 Encounter for therapeutic drug level monitoring: Secondary | ICD-10-CM | POA: Diagnosis not present

## 2019-08-10 DIAGNOSIS — J439 Emphysema, unspecified: Secondary | ICD-10-CM | POA: Diagnosis not present

## 2019-08-10 DIAGNOSIS — I272 Pulmonary hypertension, unspecified: Secondary | ICD-10-CM | POA: Diagnosis not present

## 2019-08-10 DIAGNOSIS — J849 Interstitial pulmonary disease, unspecified: Secondary | ICD-10-CM | POA: Diagnosis not present

## 2019-08-10 DIAGNOSIS — G4733 Obstructive sleep apnea (adult) (pediatric): Secondary | ICD-10-CM | POA: Diagnosis not present

## 2019-08-11 DIAGNOSIS — U071 COVID-19: Secondary | ICD-10-CM | POA: Diagnosis not present

## 2019-08-13 DIAGNOSIS — I471 Supraventricular tachycardia: Secondary | ICD-10-CM | POA: Diagnosis not present

## 2019-08-13 DIAGNOSIS — I509 Heart failure, unspecified: Secondary | ICD-10-CM | POA: Diagnosis not present

## 2019-08-13 DIAGNOSIS — Z7901 Long term (current) use of anticoagulants: Secondary | ICD-10-CM | POA: Diagnosis not present

## 2019-08-13 DIAGNOSIS — Z9981 Dependence on supplemental oxygen: Secondary | ICD-10-CM | POA: Diagnosis not present

## 2019-08-13 DIAGNOSIS — I272 Pulmonary hypertension, unspecified: Secondary | ICD-10-CM | POA: Diagnosis not present

## 2019-08-13 DIAGNOSIS — M3589 Other specified systemic involvement of connective tissue: Secondary | ICD-10-CM | POA: Diagnosis not present

## 2019-08-13 DIAGNOSIS — E669 Obesity, unspecified: Secondary | ICD-10-CM | POA: Diagnosis not present

## 2019-08-13 DIAGNOSIS — M109 Gout, unspecified: Secondary | ICD-10-CM | POA: Diagnosis not present

## 2019-08-13 DIAGNOSIS — I2781 Cor pulmonale (chronic): Secondary | ICD-10-CM | POA: Diagnosis not present

## 2019-08-13 DIAGNOSIS — Z950 Presence of cardiac pacemaker: Secondary | ICD-10-CM | POA: Diagnosis not present

## 2019-08-13 DIAGNOSIS — Z8616 Personal history of COVID-19: Secondary | ICD-10-CM | POA: Diagnosis not present

## 2019-08-13 DIAGNOSIS — Z6832 Body mass index (BMI) 32.0-32.9, adult: Secondary | ICD-10-CM | POA: Diagnosis not present

## 2019-08-16 DIAGNOSIS — I471 Supraventricular tachycardia: Secondary | ICD-10-CM | POA: Diagnosis not present

## 2019-08-16 DIAGNOSIS — I509 Heart failure, unspecified: Secondary | ICD-10-CM | POA: Diagnosis not present

## 2019-08-16 DIAGNOSIS — I2781 Cor pulmonale (chronic): Secondary | ICD-10-CM | POA: Diagnosis not present

## 2019-08-16 DIAGNOSIS — M3589 Other specified systemic involvement of connective tissue: Secondary | ICD-10-CM | POA: Diagnosis not present

## 2019-08-16 DIAGNOSIS — I272 Pulmonary hypertension, unspecified: Secondary | ICD-10-CM | POA: Diagnosis not present

## 2019-08-16 DIAGNOSIS — M109 Gout, unspecified: Secondary | ICD-10-CM | POA: Diagnosis not present

## 2019-08-17 DIAGNOSIS — M3589 Other specified systemic involvement of connective tissue: Secondary | ICD-10-CM | POA: Diagnosis not present

## 2019-08-17 DIAGNOSIS — I471 Supraventricular tachycardia: Secondary | ICD-10-CM | POA: Diagnosis not present

## 2019-08-17 DIAGNOSIS — M109 Gout, unspecified: Secondary | ICD-10-CM | POA: Diagnosis not present

## 2019-08-17 DIAGNOSIS — I509 Heart failure, unspecified: Secondary | ICD-10-CM | POA: Diagnosis not present

## 2019-08-17 DIAGNOSIS — I2781 Cor pulmonale (chronic): Secondary | ICD-10-CM | POA: Diagnosis not present

## 2019-08-17 DIAGNOSIS — I272 Pulmonary hypertension, unspecified: Secondary | ICD-10-CM | POA: Diagnosis not present

## 2019-08-18 DIAGNOSIS — I2781 Cor pulmonale (chronic): Secondary | ICD-10-CM | POA: Diagnosis not present

## 2019-08-18 DIAGNOSIS — M3589 Other specified systemic involvement of connective tissue: Secondary | ICD-10-CM | POA: Diagnosis not present

## 2019-08-18 DIAGNOSIS — I272 Pulmonary hypertension, unspecified: Secondary | ICD-10-CM | POA: Diagnosis not present

## 2019-08-18 DIAGNOSIS — I471 Supraventricular tachycardia: Secondary | ICD-10-CM | POA: Diagnosis not present

## 2019-08-18 DIAGNOSIS — M109 Gout, unspecified: Secondary | ICD-10-CM | POA: Diagnosis not present

## 2019-08-18 DIAGNOSIS — I509 Heart failure, unspecified: Secondary | ICD-10-CM | POA: Diagnosis not present

## 2019-08-23 DIAGNOSIS — I272 Pulmonary hypertension, unspecified: Secondary | ICD-10-CM | POA: Diagnosis not present

## 2019-08-23 DIAGNOSIS — I471 Supraventricular tachycardia: Secondary | ICD-10-CM | POA: Diagnosis not present

## 2019-08-23 DIAGNOSIS — I509 Heart failure, unspecified: Secondary | ICD-10-CM | POA: Diagnosis not present

## 2019-08-23 DIAGNOSIS — M3589 Other specified systemic involvement of connective tissue: Secondary | ICD-10-CM | POA: Diagnosis not present

## 2019-08-23 DIAGNOSIS — I2781 Cor pulmonale (chronic): Secondary | ICD-10-CM | POA: Diagnosis not present

## 2019-08-23 DIAGNOSIS — M109 Gout, unspecified: Secondary | ICD-10-CM | POA: Diagnosis not present

## 2019-08-24 DIAGNOSIS — Z7189 Other specified counseling: Secondary | ICD-10-CM | POA: Diagnosis not present

## 2019-08-24 DIAGNOSIS — I270 Primary pulmonary hypertension: Secondary | ICD-10-CM | POA: Diagnosis not present

## 2019-08-24 DIAGNOSIS — R0602 Shortness of breath: Secondary | ICD-10-CM | POA: Diagnosis not present

## 2019-08-24 DIAGNOSIS — I272 Pulmonary hypertension, unspecified: Secondary | ICD-10-CM | POA: Diagnosis not present

## 2019-08-24 DIAGNOSIS — R6889 Other general symptoms and signs: Secondary | ICD-10-CM | POA: Diagnosis not present

## 2019-08-24 DIAGNOSIS — R5383 Other fatigue: Secondary | ICD-10-CM | POA: Diagnosis not present

## 2019-08-24 DIAGNOSIS — R0600 Dyspnea, unspecified: Secondary | ICD-10-CM | POA: Diagnosis not present

## 2019-08-24 DIAGNOSIS — Z515 Encounter for palliative care: Secondary | ICD-10-CM | POA: Diagnosis not present

## 2019-08-25 DIAGNOSIS — M109 Gout, unspecified: Secondary | ICD-10-CM | POA: Diagnosis not present

## 2019-08-25 DIAGNOSIS — M3589 Other specified systemic involvement of connective tissue: Secondary | ICD-10-CM | POA: Diagnosis not present

## 2019-08-25 DIAGNOSIS — I272 Pulmonary hypertension, unspecified: Secondary | ICD-10-CM | POA: Diagnosis not present

## 2019-08-25 DIAGNOSIS — I471 Supraventricular tachycardia: Secondary | ICD-10-CM | POA: Diagnosis not present

## 2019-08-25 DIAGNOSIS — I509 Heart failure, unspecified: Secondary | ICD-10-CM | POA: Diagnosis not present

## 2019-08-25 DIAGNOSIS — I2781 Cor pulmonale (chronic): Secondary | ICD-10-CM | POA: Diagnosis not present

## 2019-08-29 DIAGNOSIS — I2781 Cor pulmonale (chronic): Secondary | ICD-10-CM | POA: Diagnosis not present

## 2019-08-29 DIAGNOSIS — M3589 Other specified systemic involvement of connective tissue: Secondary | ICD-10-CM | POA: Diagnosis not present

## 2019-08-29 DIAGNOSIS — I509 Heart failure, unspecified: Secondary | ICD-10-CM | POA: Diagnosis not present

## 2019-08-29 DIAGNOSIS — I272 Pulmonary hypertension, unspecified: Secondary | ICD-10-CM | POA: Diagnosis not present

## 2019-08-29 DIAGNOSIS — M109 Gout, unspecified: Secondary | ICD-10-CM | POA: Diagnosis not present

## 2019-08-29 DIAGNOSIS — I471 Supraventricular tachycardia: Secondary | ICD-10-CM | POA: Diagnosis not present

## 2019-08-31 DIAGNOSIS — M109 Gout, unspecified: Secondary | ICD-10-CM | POA: Diagnosis not present

## 2019-08-31 DIAGNOSIS — I509 Heart failure, unspecified: Secondary | ICD-10-CM | POA: Diagnosis not present

## 2019-08-31 DIAGNOSIS — M3589 Other specified systemic involvement of connective tissue: Secondary | ICD-10-CM | POA: Diagnosis not present

## 2019-08-31 DIAGNOSIS — I2781 Cor pulmonale (chronic): Secondary | ICD-10-CM | POA: Diagnosis not present

## 2019-08-31 DIAGNOSIS — I471 Supraventricular tachycardia: Secondary | ICD-10-CM | POA: Diagnosis not present

## 2019-08-31 DIAGNOSIS — I272 Pulmonary hypertension, unspecified: Secondary | ICD-10-CM | POA: Diagnosis not present

## 2019-09-07 DIAGNOSIS — R5383 Other fatigue: Secondary | ICD-10-CM | POA: Diagnosis not present

## 2019-09-07 DIAGNOSIS — R609 Edema, unspecified: Secondary | ICD-10-CM | POA: Diagnosis not present

## 2019-09-07 DIAGNOSIS — R0602 Shortness of breath: Secondary | ICD-10-CM | POA: Diagnosis not present

## 2019-09-07 DIAGNOSIS — I270 Primary pulmonary hypertension: Secondary | ICD-10-CM | POA: Diagnosis not present

## 2019-09-07 DIAGNOSIS — I272 Pulmonary hypertension, unspecified: Secondary | ICD-10-CM | POA: Diagnosis not present

## 2019-09-07 DIAGNOSIS — R0600 Dyspnea, unspecified: Secondary | ICD-10-CM | POA: Diagnosis not present

## 2019-09-07 DIAGNOSIS — I10 Essential (primary) hypertension: Secondary | ICD-10-CM | POA: Diagnosis not present

## 2019-09-07 DIAGNOSIS — I519 Heart disease, unspecified: Secondary | ICD-10-CM | POA: Diagnosis not present

## 2019-09-08 DIAGNOSIS — I2781 Cor pulmonale (chronic): Secondary | ICD-10-CM | POA: Diagnosis not present

## 2019-09-08 DIAGNOSIS — I471 Supraventricular tachycardia: Secondary | ICD-10-CM | POA: Diagnosis not present

## 2019-09-08 DIAGNOSIS — I272 Pulmonary hypertension, unspecified: Secondary | ICD-10-CM | POA: Diagnosis not present

## 2019-09-08 DIAGNOSIS — M3589 Other specified systemic involvement of connective tissue: Secondary | ICD-10-CM | POA: Diagnosis not present

## 2019-09-08 DIAGNOSIS — I509 Heart failure, unspecified: Secondary | ICD-10-CM | POA: Diagnosis not present

## 2019-09-08 DIAGNOSIS — M109 Gout, unspecified: Secondary | ICD-10-CM | POA: Diagnosis not present

## 2019-09-10 DIAGNOSIS — Z20828 Contact with and (suspected) exposure to other viral communicable diseases: Secondary | ICD-10-CM | POA: Diagnosis not present

## 2019-09-12 DIAGNOSIS — I272 Pulmonary hypertension, unspecified: Secondary | ICD-10-CM | POA: Diagnosis not present

## 2019-09-12 DIAGNOSIS — M3589 Other specified systemic involvement of connective tissue: Secondary | ICD-10-CM | POA: Diagnosis not present

## 2019-09-12 DIAGNOSIS — I471 Supraventricular tachycardia: Secondary | ICD-10-CM | POA: Diagnosis not present

## 2019-09-12 DIAGNOSIS — I2781 Cor pulmonale (chronic): Secondary | ICD-10-CM | POA: Diagnosis not present

## 2019-09-12 DIAGNOSIS — I509 Heart failure, unspecified: Secondary | ICD-10-CM | POA: Diagnosis not present

## 2019-09-12 DIAGNOSIS — Z6832 Body mass index (BMI) 32.0-32.9, adult: Secondary | ICD-10-CM | POA: Diagnosis not present

## 2019-09-12 DIAGNOSIS — M109 Gout, unspecified: Secondary | ICD-10-CM | POA: Diagnosis not present

## 2019-09-12 DIAGNOSIS — E669 Obesity, unspecified: Secondary | ICD-10-CM | POA: Diagnosis not present

## 2019-09-12 DIAGNOSIS — Z9981 Dependence on supplemental oxygen: Secondary | ICD-10-CM | POA: Diagnosis not present

## 2019-09-12 DIAGNOSIS — Z7901 Long term (current) use of anticoagulants: Secondary | ICD-10-CM | POA: Diagnosis not present

## 2019-09-12 DIAGNOSIS — Z8616 Personal history of COVID-19: Secondary | ICD-10-CM | POA: Diagnosis not present

## 2019-09-12 DIAGNOSIS — Z950 Presence of cardiac pacemaker: Secondary | ICD-10-CM | POA: Diagnosis not present

## 2019-09-13 DIAGNOSIS — R0602 Shortness of breath: Secondary | ICD-10-CM | POA: Diagnosis not present

## 2019-09-13 DIAGNOSIS — R9430 Abnormal result of cardiovascular function study, unspecified: Secondary | ICD-10-CM | POA: Diagnosis not present

## 2019-09-16 DIAGNOSIS — I272 Pulmonary hypertension, unspecified: Secondary | ICD-10-CM | POA: Diagnosis not present

## 2019-09-16 DIAGNOSIS — R0600 Dyspnea, unspecified: Secondary | ICD-10-CM | POA: Diagnosis not present

## 2019-09-19 ENCOUNTER — Other Ambulatory Visit (HOSPITAL_COMMUNITY): Payer: Self-pay | Admitting: Cardiology

## 2019-09-19 DIAGNOSIS — I509 Heart failure, unspecified: Secondary | ICD-10-CM | POA: Diagnosis not present

## 2019-09-19 DIAGNOSIS — M3589 Other specified systemic involvement of connective tissue: Secondary | ICD-10-CM | POA: Diagnosis not present

## 2019-09-19 DIAGNOSIS — I2781 Cor pulmonale (chronic): Secondary | ICD-10-CM | POA: Diagnosis not present

## 2019-09-19 DIAGNOSIS — I471 Supraventricular tachycardia: Secondary | ICD-10-CM | POA: Diagnosis not present

## 2019-09-19 DIAGNOSIS — I272 Pulmonary hypertension, unspecified: Secondary | ICD-10-CM | POA: Diagnosis not present

## 2019-09-19 DIAGNOSIS — M109 Gout, unspecified: Secondary | ICD-10-CM | POA: Diagnosis not present

## 2019-09-19 MED ORDER — APIXABAN 5 MG PO TABS: 5.0000 mg | ORAL_TABLET | Freq: Two times a day (BID) | ORAL | 3 refills | Status: DC

## 2019-09-23 ENCOUNTER — Telehealth: Payer: Self-pay

## 2019-09-23 NOTE — Telephone Encounter (Signed)
Pt transferred to [facility] and Vascular. I cancelled upcoming appointments and marked her inactive in paceart.

## 2019-09-26 DIAGNOSIS — R0602 Shortness of breath: Secondary | ICD-10-CM | POA: Diagnosis not present

## 2019-09-26 DIAGNOSIS — R5383 Other fatigue: Secondary | ICD-10-CM | POA: Diagnosis not present

## 2019-09-26 DIAGNOSIS — R0600 Dyspnea, unspecified: Secondary | ICD-10-CM | POA: Diagnosis not present

## 2019-10-04 DIAGNOSIS — I471 Supraventricular tachycardia: Secondary | ICD-10-CM | POA: Diagnosis not present

## 2019-10-04 DIAGNOSIS — I2781 Cor pulmonale (chronic): Secondary | ICD-10-CM | POA: Diagnosis not present

## 2019-10-04 DIAGNOSIS — M3589 Other specified systemic involvement of connective tissue: Secondary | ICD-10-CM | POA: Diagnosis not present

## 2019-10-04 DIAGNOSIS — I272 Pulmonary hypertension, unspecified: Secondary | ICD-10-CM | POA: Diagnosis not present

## 2019-10-04 DIAGNOSIS — M109 Gout, unspecified: Secondary | ICD-10-CM | POA: Diagnosis not present

## 2019-10-04 DIAGNOSIS — I509 Heart failure, unspecified: Secondary | ICD-10-CM | POA: Diagnosis not present

## 2019-10-12 ENCOUNTER — Other Ambulatory Visit: Payer: Self-pay

## 2019-10-12 DIAGNOSIS — R0602 Shortness of breath: Secondary | ICD-10-CM

## 2019-10-12 DIAGNOSIS — R0600 Dyspnea, unspecified: Secondary | ICD-10-CM | POA: Diagnosis not present

## 2019-10-12 DIAGNOSIS — R6889 Other general symptoms and signs: Secondary | ICD-10-CM | POA: Diagnosis not present

## 2019-10-12 DIAGNOSIS — I272 Pulmonary hypertension, unspecified: Secondary | ICD-10-CM | POA: Diagnosis not present

## 2019-10-12 DIAGNOSIS — R609 Edema, unspecified: Secondary | ICD-10-CM | POA: Diagnosis not present

## 2019-10-12 DIAGNOSIS — R5383 Other fatigue: Secondary | ICD-10-CM | POA: Diagnosis not present

## 2019-10-13 ENCOUNTER — Encounter: Payer: Self-pay | Admitting: Pulmonary Disease

## 2019-10-14 ENCOUNTER — Other Ambulatory Visit: Payer: Self-pay

## 2019-10-17 ENCOUNTER — Other Ambulatory Visit: Payer: Self-pay | Admitting: Nurse Practitioner

## 2019-10-17 DIAGNOSIS — R6889 Other general symptoms and signs: Secondary | ICD-10-CM | POA: Diagnosis not present

## 2019-10-17 DIAGNOSIS — R5383 Other fatigue: Secondary | ICD-10-CM | POA: Diagnosis not present

## 2019-10-17 DIAGNOSIS — I272 Pulmonary hypertension, unspecified: Secondary | ICD-10-CM | POA: Diagnosis not present

## 2019-10-17 DIAGNOSIS — R609 Edema, unspecified: Secondary | ICD-10-CM | POA: Diagnosis not present

## 2019-10-17 DIAGNOSIS — R0600 Dyspnea, unspecified: Secondary | ICD-10-CM | POA: Diagnosis not present

## 2019-10-19 DIAGNOSIS — Z23 Encounter for immunization: Secondary | ICD-10-CM | POA: Diagnosis not present

## 2019-10-26 ENCOUNTER — Ambulatory Visit
Admission: RE | Admit: 2019-10-26 | Discharge: 2019-10-26 | Disposition: A | Discharge status: Home / Self Care | Payer: Self-pay | Source: Ambulatory Visit

## 2019-10-26 ENCOUNTER — Other Ambulatory Visit: Payer: Self-pay

## 2019-10-26 DIAGNOSIS — I272 Pulmonary hypertension, unspecified: Secondary | ICD-10-CM | POA: Diagnosis not present

## 2019-10-26 DIAGNOSIS — R6889 Other general symptoms and signs: Secondary | ICD-10-CM | POA: Diagnosis not present

## 2019-10-26 DIAGNOSIS — R609 Edema, unspecified: Secondary | ICD-10-CM | POA: Diagnosis not present

## 2019-10-26 DIAGNOSIS — R0600 Dyspnea, unspecified: Secondary | ICD-10-CM | POA: Diagnosis not present

## 2019-10-26 DIAGNOSIS — R5383 Other fatigue: Secondary | ICD-10-CM | POA: Diagnosis not present

## 2019-10-27 ENCOUNTER — Ambulatory Visit: Admission: RE | Admit: 2019-10-27 | Payer: Self-pay | Source: Ambulatory Visit

## 2019-10-27 ENCOUNTER — Ambulatory Visit
Admission: RE | Admit: 2019-10-27 | Discharge: 2019-10-27 | Disposition: A | Discharge status: Home / Self Care | Payer: Self-pay | Source: Ambulatory Visit

## 2019-10-27 ENCOUNTER — Other Ambulatory Visit: Payer: Self-pay

## 2019-11-02 DIAGNOSIS — R609 Edema, unspecified: Secondary | ICD-10-CM | POA: Diagnosis not present

## 2019-11-02 DIAGNOSIS — R5383 Other fatigue: Secondary | ICD-10-CM | POA: Diagnosis not present

## 2019-11-02 DIAGNOSIS — R0600 Dyspnea, unspecified: Secondary | ICD-10-CM | POA: Diagnosis not present

## 2019-11-02 DIAGNOSIS — R6889 Other general symptoms and signs: Secondary | ICD-10-CM | POA: Diagnosis not present

## 2019-11-02 DIAGNOSIS — I272 Pulmonary hypertension, unspecified: Secondary | ICD-10-CM | POA: Diagnosis not present

## 2019-11-04 DIAGNOSIS — Z1231 Encounter for screening mammogram for malignant neoplasm of breast: Secondary | ICD-10-CM | POA: Diagnosis not present

## 2019-11-07 DIAGNOSIS — I272 Pulmonary hypertension, unspecified: Secondary | ICD-10-CM | POA: Diagnosis not present

## 2019-11-07 DIAGNOSIS — R609 Edema, unspecified: Secondary | ICD-10-CM | POA: Diagnosis not present

## 2019-11-07 DIAGNOSIS — R0600 Dyspnea, unspecified: Secondary | ICD-10-CM | POA: Diagnosis not present

## 2019-11-07 DIAGNOSIS — R6889 Other general symptoms and signs: Secondary | ICD-10-CM | POA: Diagnosis not present

## 2019-11-07 DIAGNOSIS — R5383 Other fatigue: Secondary | ICD-10-CM | POA: Diagnosis not present

## 2019-11-09 DIAGNOSIS — J849 Interstitial pulmonary disease, unspecified: Secondary | ICD-10-CM | POA: Diagnosis not present

## 2019-11-09 DIAGNOSIS — Z8616 Personal history of COVID-19: Secondary | ICD-10-CM | POA: Diagnosis not present

## 2019-11-09 DIAGNOSIS — D8989 Other specified disorders involving the immune mechanism, not elsewhere classified: Secondary | ICD-10-CM | POA: Diagnosis not present

## 2019-11-09 DIAGNOSIS — Z9981 Dependence on supplemental oxygen: Secondary | ICD-10-CM | POA: Diagnosis not present

## 2019-11-09 DIAGNOSIS — I272 Pulmonary hypertension, unspecified: Secondary | ICD-10-CM | POA: Diagnosis not present

## 2019-11-09 DIAGNOSIS — G4733 Obstructive sleep apnea (adult) (pediatric): Secondary | ICD-10-CM | POA: Diagnosis not present

## 2019-11-16 DIAGNOSIS — I5022 Chronic systolic (congestive) heart failure: Secondary | ICD-10-CM | POA: Diagnosis present

## 2019-11-16 DIAGNOSIS — J849 Interstitial pulmonary disease, unspecified: Secondary | ICD-10-CM | POA: Diagnosis present

## 2019-11-16 DIAGNOSIS — I5023 Acute on chronic systolic (congestive) heart failure: Secondary | ICD-10-CM | POA: Diagnosis not present

## 2019-11-16 DIAGNOSIS — I10 Essential (primary) hypertension: Secondary | ICD-10-CM | POA: Diagnosis not present

## 2019-11-16 DIAGNOSIS — M109 Gout, unspecified: Secondary | ICD-10-CM | POA: Diagnosis present

## 2019-11-16 DIAGNOSIS — J449 Chronic obstructive pulmonary disease, unspecified: Secondary | ICD-10-CM | POA: Diagnosis present

## 2019-11-16 DIAGNOSIS — E039 Hypothyroidism, unspecified: Secondary | ICD-10-CM | POA: Diagnosis present

## 2019-11-16 DIAGNOSIS — I480 Paroxysmal atrial fibrillation: Secondary | ICD-10-CM | POA: Diagnosis present

## 2019-11-16 DIAGNOSIS — G4730 Sleep apnea, unspecified: Secondary | ICD-10-CM | POA: Diagnosis present

## 2019-11-16 DIAGNOSIS — I422 Other hypertrophic cardiomyopathy: Secondary | ICD-10-CM | POA: Diagnosis not present

## 2019-11-16 DIAGNOSIS — R42 Dizziness and giddiness: Secondary | ICD-10-CM | POA: Diagnosis not present

## 2019-11-16 DIAGNOSIS — T45515A Adverse effect of anticoagulants, initial encounter: Secondary | ICD-10-CM | POA: Diagnosis not present

## 2019-11-16 DIAGNOSIS — I517 Cardiomegaly: Secondary | ICD-10-CM | POA: Diagnosis not present

## 2019-11-16 DIAGNOSIS — R0989 Other specified symptoms and signs involving the circulatory and respiratory systems: Secondary | ICD-10-CM | POA: Diagnosis not present

## 2019-11-16 DIAGNOSIS — Z8701 Personal history of pneumonia (recurrent): Secondary | ICD-10-CM | POA: Diagnosis not present

## 2019-11-16 DIAGNOSIS — D62 Acute posthemorrhagic anemia: Secondary | ICD-10-CM | POA: Diagnosis present

## 2019-11-16 DIAGNOSIS — K635 Polyp of colon: Secondary | ICD-10-CM | POA: Diagnosis present

## 2019-11-16 DIAGNOSIS — K921 Melena: Secondary | ICD-10-CM | POA: Diagnosis not present

## 2019-11-16 DIAGNOSIS — E785 Hyperlipidemia, unspecified: Secondary | ICD-10-CM | POA: Diagnosis present

## 2019-11-16 DIAGNOSIS — D649 Anemia, unspecified: Secondary | ICD-10-CM | POA: Diagnosis not present

## 2019-11-16 DIAGNOSIS — K625 Hemorrhage of anus and rectum: Secondary | ICD-10-CM | POA: Diagnosis not present

## 2019-11-16 DIAGNOSIS — I272 Pulmonary hypertension, unspecified: Secondary | ICD-10-CM | POA: Diagnosis not present

## 2019-11-16 DIAGNOSIS — Z531 Procedure and treatment not carried out because of patient's decision for reasons of belief and group pressure: Secondary | ICD-10-CM | POA: Diagnosis not present

## 2019-11-16 DIAGNOSIS — N1831 Chronic kidney disease, stage 3a: Secondary | ICD-10-CM | POA: Diagnosis not present

## 2019-11-16 DIAGNOSIS — K5731 Diverticulosis of large intestine without perforation or abscess with bleeding: Secondary | ICD-10-CM | POA: Diagnosis present

## 2019-11-16 DIAGNOSIS — I2721 Secondary pulmonary arterial hypertension: Secondary | ICD-10-CM | POA: Diagnosis present

## 2019-11-16 DIAGNOSIS — Z950 Presence of cardiac pacemaker: Secondary | ICD-10-CM | POA: Diagnosis not present

## 2019-11-16 DIAGNOSIS — R0902 Hypoxemia: Secondary | ICD-10-CM | POA: Diagnosis present

## 2019-11-16 DIAGNOSIS — Z8616 Personal history of COVID-19: Secondary | ICD-10-CM | POA: Diagnosis not present

## 2019-11-16 DIAGNOSIS — N183 Chronic kidney disease, stage 3 unspecified: Secondary | ICD-10-CM | POA: Diagnosis not present

## 2019-11-16 DIAGNOSIS — Z79899 Other long term (current) drug therapy: Secondary | ICD-10-CM | POA: Diagnosis not present

## 2019-11-16 DIAGNOSIS — D500 Iron deficiency anemia secondary to blood loss (chronic): Secondary | ICD-10-CM | POA: Diagnosis not present

## 2019-11-16 DIAGNOSIS — I959 Hypotension, unspecified: Secondary | ICD-10-CM | POA: Diagnosis not present

## 2019-11-16 DIAGNOSIS — D8989 Other specified disorders involving the immune mechanism, not elsewhere classified: Secondary | ICD-10-CM | POA: Diagnosis present

## 2019-11-16 DIAGNOSIS — R0602 Shortness of breath: Secondary | ICD-10-CM | POA: Diagnosis not present

## 2019-11-16 DIAGNOSIS — K922 Gastrointestinal hemorrhage, unspecified: Secondary | ICD-10-CM | POA: Diagnosis not present

## 2019-11-16 DIAGNOSIS — E611 Iron deficiency: Secondary | ICD-10-CM | POA: Diagnosis not present

## 2019-11-24 DIAGNOSIS — K625 Hemorrhage of anus and rectum: Secondary | ICD-10-CM | POA: Diagnosis not present

## 2019-11-24 DIAGNOSIS — M25562 Pain in left knee: Secondary | ICD-10-CM | POA: Diagnosis not present

## 2019-11-24 DIAGNOSIS — M1712 Unilateral primary osteoarthritis, left knee: Secondary | ICD-10-CM | POA: Diagnosis not present

## 2019-11-24 DIAGNOSIS — Z9981 Dependence on supplemental oxygen: Secondary | ICD-10-CM | POA: Diagnosis not present

## 2019-11-24 DIAGNOSIS — Z7901 Long term (current) use of anticoagulants: Secondary | ICD-10-CM | POA: Diagnosis not present

## 2019-11-24 DIAGNOSIS — M79605 Pain in left leg: Secondary | ICD-10-CM | POA: Diagnosis not present

## 2019-11-24 DIAGNOSIS — I2510 Atherosclerotic heart disease of native coronary artery without angina pectoris: Secondary | ICD-10-CM | POA: Diagnosis not present

## 2019-11-24 DIAGNOSIS — I272 Pulmonary hypertension, unspecified: Secondary | ICD-10-CM | POA: Diagnosis not present

## 2019-11-24 DIAGNOSIS — Z86718 Personal history of other venous thrombosis and embolism: Secondary | ICD-10-CM | POA: Diagnosis not present

## 2019-11-28 DIAGNOSIS — M25562 Pain in left knee: Secondary | ICD-10-CM | POA: Diagnosis not present

## 2019-11-30 ENCOUNTER — Telehealth (HOSPITAL_COMMUNITY): Payer: Self-pay | Admitting: Pharmacy Technician

## 2019-11-30 DIAGNOSIS — R5383 Other fatigue: Secondary | ICD-10-CM | POA: Diagnosis not present

## 2019-11-30 DIAGNOSIS — R0600 Dyspnea, unspecified: Secondary | ICD-10-CM | POA: Diagnosis not present

## 2019-11-30 DIAGNOSIS — R6889 Other general symptoms and signs: Secondary | ICD-10-CM | POA: Diagnosis not present

## 2019-11-30 DIAGNOSIS — R609 Edema, unspecified: Secondary | ICD-10-CM | POA: Diagnosis not present

## 2019-11-30 DIAGNOSIS — I272 Pulmonary hypertension, unspecified: Secondary | ICD-10-CM | POA: Diagnosis not present

## 2019-11-30 NOTE — Telephone Encounter (Signed)
Received a notification that the patient needs a PA on Uptravi. Upon Completion, [employer] is stating that there is no PA needed at this time. The patient has access to the requested medication.  Charlann Boxer, CPhT

## 2019-12-14 DIAGNOSIS — I272 Pulmonary hypertension, unspecified: Secondary | ICD-10-CM | POA: Diagnosis not present

## 2019-12-14 DIAGNOSIS — I429 Cardiomyopathy, unspecified: Secondary | ICD-10-CM | POA: Diagnosis not present

## 2019-12-14 DIAGNOSIS — Z950 Presence of cardiac pacemaker: Secondary | ICD-10-CM | POA: Diagnosis not present

## 2019-12-14 DIAGNOSIS — I959 Hypotension, unspecified: Secondary | ICD-10-CM | POA: Diagnosis not present

## 2019-12-20 DIAGNOSIS — K5790 Diverticulosis of intestine, part unspecified, without perforation or abscess without bleeding: Secondary | ICD-10-CM | POA: Diagnosis not present

## 2019-12-20 DIAGNOSIS — Z8719 Personal history of other diseases of the digestive system: Secondary | ICD-10-CM | POA: Diagnosis not present

## 2019-12-21 DIAGNOSIS — I272 Pulmonary hypertension, unspecified: Secondary | ICD-10-CM | POA: Diagnosis not present

## 2019-12-21 DIAGNOSIS — G4733 Obstructive sleep apnea (adult) (pediatric): Secondary | ICD-10-CM | POA: Diagnosis not present

## 2019-12-21 DIAGNOSIS — Z79899 Other long term (current) drug therapy: Secondary | ICD-10-CM | POA: Diagnosis not present

## 2019-12-21 DIAGNOSIS — Z7901 Long term (current) use of anticoagulants: Secondary | ICD-10-CM | POA: Diagnosis not present

## 2019-12-21 DIAGNOSIS — I4891 Unspecified atrial fibrillation: Secondary | ICD-10-CM | POA: Diagnosis not present

## 2019-12-21 DIAGNOSIS — D8989 Other specified disorders involving the immune mechanism, not elsewhere classified: Secondary | ICD-10-CM | POA: Diagnosis not present

## 2019-12-21 DIAGNOSIS — J1289 Other viral pneumonia: Secondary | ICD-10-CM | POA: Diagnosis not present

## 2019-12-22 DIAGNOSIS — D509 Iron deficiency anemia, unspecified: Secondary | ICD-10-CM | POA: Diagnosis not present

## 2019-12-22 DIAGNOSIS — K922 Gastrointestinal hemorrhage, unspecified: Secondary | ICD-10-CM | POA: Diagnosis not present

## 2019-12-22 DIAGNOSIS — D649 Anemia, unspecified: Secondary | ICD-10-CM | POA: Diagnosis not present

## 2019-12-28 DIAGNOSIS — M25562 Pain in left knee: Secondary | ICD-10-CM | POA: Diagnosis not present

## 2019-12-28 DIAGNOSIS — M1990 Unspecified osteoarthritis, unspecified site: Secondary | ICD-10-CM | POA: Diagnosis not present

## 2019-12-28 DIAGNOSIS — M109 Gout, unspecified: Secondary | ICD-10-CM | POA: Diagnosis not present

## 2019-12-29 ENCOUNTER — Other Ambulatory Visit: Payer: Self-pay

## 2019-12-29 DIAGNOSIS — I2721 Secondary pulmonary arterial hypertension: Secondary | ICD-10-CM

## 2019-12-29 DIAGNOSIS — Z1329 Encounter for screening for other suspected endocrine disorder: Secondary | ICD-10-CM

## 2019-12-29 DIAGNOSIS — R0602 Shortness of breath: Secondary | ICD-10-CM

## 2019-12-29 DIAGNOSIS — Z130 Encounter for screening for diseases of the blood and blood-forming organs and certain disorders involving the immune mechanism: Secondary | ICD-10-CM

## 2019-12-29 DIAGNOSIS — R601 Generalized edema: Secondary | ICD-10-CM

## 2019-12-29 DIAGNOSIS — E559 Vitamin D deficiency, unspecified: Secondary | ICD-10-CM

## 2019-12-29 DIAGNOSIS — R5383 Other fatigue: Secondary | ICD-10-CM

## 2019-12-30 ENCOUNTER — Ambulatory Visit: Payer: Medicare Other | Attending: Internal Medicine | Admitting: Internal Medicine

## 2019-12-30 ENCOUNTER — Ambulatory Visit: Payer: Medicare Other

## 2019-12-30 ENCOUNTER — Other Ambulatory Visit (HOSPITAL_BASED_OUTPATIENT_CLINIC_OR_DEPARTMENT_OTHER): Payer: Medicare Other

## 2019-12-30 ENCOUNTER — Encounter: Payer: Self-pay | Admitting: Internal Medicine

## 2019-12-30 VITALS — BP 113/76 | HR 89 | Temp 97.8°F | Resp 22 | Wt 184.8 lb

## 2019-12-30 DIAGNOSIS — R0602 Shortness of breath: Secondary | ICD-10-CM

## 2019-12-30 DIAGNOSIS — R601 Generalized edema: Secondary | ICD-10-CM

## 2019-12-30 DIAGNOSIS — Z1329 Encounter for screening for other suspected endocrine disorder: Secondary | ICD-10-CM | POA: Insufficient documentation

## 2019-12-30 DIAGNOSIS — J849 Interstitial pulmonary disease, unspecified: Secondary | ICD-10-CM

## 2019-12-30 DIAGNOSIS — M109 Gout, unspecified: Secondary | ICD-10-CM

## 2019-12-30 DIAGNOSIS — I2721 Secondary pulmonary arterial hypertension: Secondary | ICD-10-CM | POA: Insufficient documentation

## 2019-12-30 DIAGNOSIS — E559 Vitamin D deficiency, unspecified: Secondary | ICD-10-CM | POA: Insufficient documentation

## 2019-12-30 DIAGNOSIS — R5383 Other fatigue: Secondary | ICD-10-CM

## 2019-12-30 DIAGNOSIS — Z130 Encounter for screening for diseases of the blood and blood-forming organs and certain disorders involving the immune mechanism: Secondary | ICD-10-CM | POA: Insufficient documentation

## 2019-12-30 DIAGNOSIS — I5030 Unspecified diastolic (congestive) heart failure: Secondary | ICD-10-CM

## 2019-12-30 DIAGNOSIS — J9611 Chronic respiratory failure with hypoxia: Secondary | ICD-10-CM

## 2019-12-30 DIAGNOSIS — Z9981 Dependence on supplemental oxygen: Secondary | ICD-10-CM

## 2019-12-30 LAB — CELL MORPHOLOGY
Cell Morphology: ABNORMAL — AB
Platelet Estimate: NORMAL

## 2019-12-30 LAB — MAN DIFF ONLY
Atypical Lymphocytes %: 1 %
Atypical Lymphocytes Absolute: 0.04 10*3/uL — ABNORMAL HIGH (ref 0.00–0.00)
Band Neutrophils Absolute: 0 10*3/uL (ref 0.00–1.00)
Band Neutrophils: 0 %
Basophils Absolute Manual: 0 10*3/uL (ref 0.00–0.08)
Basophils Manual: 0 %
Eosinophils Absolute Manual: 0.04 10*3/uL (ref 0.00–0.44)
Eosinophils Manual: 1 %
Lymphocytes Absolute Manual: 1.1 10*3/uL (ref 0.42–3.22)
Lymphocytes Manual: 30 %
Monocytes Absolute: 0.15 10*3/uL — ABNORMAL LOW (ref 0.21–0.85)
Monocytes Manual: 4 %
Neutrophils Absolute Manual: 2.34 10*3/uL (ref 1.10–6.33)
Segmented Neutrophils: 64 %

## 2019-12-30 LAB — SPIROMETRY
FEF 25-75% (Pre-Bronch) %Pred: 60 %
FEF 25-75% (Pre-Bronch) Actual: 0.9 L/sec
FEF 25-75% (Pre-Bronch) Pred: 1.5 L/sec
FEF Max (Pre-Bronch) %Pred: 119 %
FEF Max (Pre-Bronch) Actual: 5.19 L/sec
FEF Max (Pre-Bronch) Pred: 4.34 L/sec
FEF2575 (Lower Limit of Normal): 0.54 L/sec
FEF2575 (Standard Deviation): 0.75 L/sec
FEFMax (Lower Limit of Normal): 2.39 L/sec
FEFMax (Standard Deviation): 1.18 L/sec
FEV1 (Lower Limit of Normal): 1.2 L
FEV1 (Pre-Bronch) %Pred: 79 %
FEV1 (Pre-Bronch) Actual: 1.38 L
FEV1 (Pre-Bronch) Pred: 1.75 L
FEV1 (Standard Deviation): 0.32 L
FEV1/FVC (Pre-Bronch) %Pred: 90 %
FEV1/FVC (Pre-Bronch) Actual: 71 %
FEV1/FVC (Pre-Bronch) Pred: 78 %
FVC (Lower Limit of Normal): 1.56 L
FVC (Pre-Bronch) %Pred: 86 %
FVC (Pre-Bronch) Actual: 1.95 L
FVC (Pre-Bronch) Pred: 2.25 L
FVC (Standard Deviation): 0.43 L
PEF (Pre-Actual): 311.4 L/min

## 2019-12-30 LAB — COMPREHENSIVE METABOLIC PANEL
ALT: 7 U/L (ref 0–55)
AST (SGOT): 13 U/L (ref 5–34)
Albumin/Globulin Ratio: 1.4 (ref 0.9–2.2)
Albumin: 4 g/dL (ref 3.5–5.0)
Alkaline Phosphatase: 122 U/L — ABNORMAL HIGH (ref 37–117)
Anion Gap: 11 (ref 5.0–15.0)
BUN: 19 mg/dL (ref 7.0–19.0)
Bilirubin, Total: 1 mg/dL (ref 0.2–1.2)
CO2: 18 mEq/L — ABNORMAL LOW (ref 21–29)
Calcium: 8.8 mg/dL (ref 7.9–10.2)
Chloride: 111 mEq/L (ref 100–111)
Creatinine: 1 mg/dL (ref 0.4–1.5)
Globulin: 2.8 g/dL (ref 2.0–3.7)
Glucose: 73 mg/dL (ref 70–100)
Potassium: 4.7 mEq/L (ref 3.5–5.1)
Protein, Total: 6.8 g/dL (ref 6.0–8.3)
Sodium: 140 mEq/L (ref 136–145)

## 2019-12-30 LAB — CBC AND DIFFERENTIAL
Absolute NRBC: 0.05 10*3/uL — ABNORMAL HIGH (ref 0.00–0.00)
Hematocrit: 37 % (ref 34.7–43.7)
Hgb: 11.4 g/dL (ref 11.4–14.8)
MCH: 30.4 pg (ref 25.1–33.5)
MCHC: 30.8 g/dL — ABNORMAL LOW (ref 31.5–35.8)
MCV: 98.7 fL — ABNORMAL HIGH (ref 78.0–96.0)
MPV: 9.5 fL (ref 8.9–12.5)
Nucleated RBC: 1.4 /100 WBC — ABNORMAL HIGH (ref 0.0–0.0)
Platelets: 252 10*3/uL (ref 142–346)
RBC: 3.75 10*6/uL — ABNORMAL LOW (ref 3.90–5.10)
RDW: 25 % — ABNORMAL HIGH (ref 11–15)
WBC: 3.66 10*3/uL (ref 3.10–9.50)

## 2019-12-30 LAB — GFR: EGFR: >60

## 2019-12-30 LAB — B-TYPE NATRIURETIC PEPTIDE: B-Natriuretic Peptide: 180 pg/mL — ABNORMAL HIGH (ref 0–100)

## 2019-12-30 LAB — TSH: TSH: 0.05 u[IU]/mL — ABNORMAL LOW (ref 0.35–4.94)

## 2019-12-30 LAB — HEMOLYSIS INDEX: Hemolysis Index: 9 (ref 0–18)

## 2019-12-30 LAB — VITAMIN D,25 OH,TOTAL: Vitamin D, 25 OH, Total: 44 ng/mL (ref 30–100)

## 2019-12-30 NOTE — Progress Notes (Signed)
12/30/19 1011   Spirometry Pre and/or Post   Procedure Location and Type ALD Clinic - Pre Only   Pre FVC 1.95 L   Pre FEV1 1.38 L   Pre FEV1/FVC % (Calculated) 71   Patient follows commands Yes   Cough during exhalation Yes   Early termination No   Leak No   Variable effort No   2 FVC 150 ml of each other Yes   2 FEV1 150 ml of each other Yes   3 maneuvers performed Yes   Patient Effort Good   Adverse Reactions None   Primary Charges   $ Spirometry Type Performed Simple - CPT 94010

## 2019-12-30 NOTE — Progress Notes (Signed)
Advanced Lung Disease and Lung Transplant Outpatient Consultation Note     Patient Name: [name]    Reason for consultation: Dr. Fuller Plan requests consultation for the evaluation of PAH  .    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 She recently moved from [place] to be closer to her daughters who live in [place], previously had all her car at [facility] (cardiology, rheumatology, pulm). She had established care at [facility] with rheumatology and pulmonology however was followed by Dr. Siri Cole who has left the practice. She is transferring her PAH and ILD care to [facility]. She has not yet found a cardiologist in the area.     She had COVID pneumonia 06/2019, hospitalized for one week not requiring HFNC or intubation, recovered well. Baseline 4L NC prior to COVID, has been on 5-6L NC since.    PAH group 1: Diagnosed with PAH via RHC 05/2013, currently on Uptravi and Opsumit. Previously was on Adempas and Sildenafil but did not tolerate.    Diastolic CHF: She also has diastolic heart failure with  Multiple echos showing severely dilated LA apical hypertrophic cardiomyopathy and preserved EF 65-70% as recently as 09/2019. 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 She takes Lasix 20mg /40mg  alternating daily doses however doesn't take it when systolic BP <100, hasn't taken it 5x this week because of hypotension. Weight is ~185lbs.    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 Started Imuran 04/2018, up to 100mg  Qhs.  She also has gout which is her major MSK complaints, is on allopurinol and colchicine.    OSA on CPAP: Diagnosed 2014 but PSG, does not believe she's seen a sleep doctor since that time, has not received a  new machine since then. She says her pressure st 14cmH2O. She wears 6LNC while also wearing the CPAP mask, does not run the O2 through the CPAP itself. Has not had her compliance monitored.    Paroxysmal Afib s/p ablation and GIB: Previously was on eliquis though has had two episodes of GIB, most recently in 11/2019. She was off Eliquis prior to the 8/21 event. She is Jehovah's witness so received IV Iron, was admitted at [facility]. Lowest Hgb was 9.1g/dL, increased to 54.0J/WJ at time of discharge. She says she has been hypotensive since this admission. Her baseline Hgb prior to GIB was 14g/dL. She was not scoped during this admission.      Oxygen needs:  6L at rest, nocturnal, and with exertion. Baseline was 4L however increased to 6L after COVID pneumonia in March 2021.    Previously did pulm rehab 2017-2018 in [place], again in Aug 2021 at [facility], however self-discontinued for hypotension and feeling dizzy.     Today she feels well overall, has no cough, mucus production, wheezing, fever/chills, diarrhea. She is dyspneic with walking short distances even while wearing her 6L NC. She can walk around her 900 ft2 apartment pretty well without a walker, but when out of the house she uses a walker or wheelchair.    SH   Patient is Jehovah's Witness  Quit smoking 2011, 13 pack years.  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    Review of Systems    Constitutional: Negative for fever, chills, night sweats, malaise/fatigue. Weight has been stable at 185lbs.  HEENT: Negative for nosebleeds, congestion, thrush, sore throat and ear discharge.   Eyes: Negative for blurred vision, pain and discharge.   Respiratory: No significant cough. Negative for hemoptysis, shortness of breath as per HPI.   Cardiovascular: Negative for chest pain, palpitations and leg swelling. No syncope. Has pacemaker  Gastrointestinal: Negative for  heartburn, nausea, vomiting and abdominal pain.   Genitourinary: Negative for dysuria, urgency and frequency.   Musculoskeletal: +joint pains mostly in left knee from gout. No significant swelling. No Raynaud's.   Skin: Negative for itching and rash.   Neurological: Negative for dizziness, tingling, weakness and headaches.   Otherwise, a complete review of systems was performed and found unremarkable.     Past History     Past Medical History:   Diagnosis Date    A-fib     Cardiomyopathy     CKD (chronic kidney disease)     COPD (chronic obstructive pulmonary disease)     COVID-19     HTN (hypertension)     NSTEMI (non-ST elevated myocardial infarction)     During LHC    Obesity     OSA on CPAP     PNA (pneumonia)        No past surgical history on file.    Family History   Problem Relation Age of Onset    Alzheimer's disease Mother     Lung cancer Father        Social History     Tobacco Use    Smoking status: Never Smoker    Smokeless tobacco: Never Used   Substance Use Topics    Alcohol use: Never       Meds:      Current Outpatient Medications   Medication Sig Dispense Refill    allopurinol (ZYLOPRIM) 100 MG tablet Take 100 mg by mouth daily Per pt taking 50mg  (half tab)      apixaban (ELIQUIS) 5 MG Take 5 mg by mouth every 12 (twelve) hours      atorvastatin (LIPITOR) 80 MG tablet Take 80 mg by mouth daily      azaTHIOprine (IMURAN) 50 MG tablet Take 100 mg by mouth daily      Colchicine 0.6 MG capsule Take 0.6 mg by mouth daily      ezetimibe (ZETIA) 10 MG tablet Take 10 mg by mouth daily      ferrous sulfate 325 (65 FE) MG tablet Take 325 mg by mouth every morning with breakfast Taking on Monday wed, friday      furosemide (LASIX) 40 MG tablet Take 40 mg by mouth daily Per pt she takes 40mg  and 20 mg alternating daily      levothyroxine (SYNTHROID) 100 MCG tablet Take 100 mcg by mouth Once a day at 6:00am      OXYGEN-HELIUM IN Inhale into the lungs      potassium chloride (K-DUR) 10  MEQ tablet Take 10 mEq by mouth      Selexipag (Uptravi) 1600 MCG Tab Take 1,600 mg by mouth 2 (two) times daily      vitamin D (CHOLECALCIFEROL) 25 MCG (1000 UT) tablet Take 2,000 Units by mouth daily       No current facility-administered medications for this visit.         Allergies  Allergen Reactions    Amiodarone     Cellcept [Mycophenolate]     Zyban [Bupropion]        Physical Exam:      Vitals:    12/30/19 1005   BP: 113/76   BP Site: Right arm   Patient Position: Sitting   Cuff Size: Large   Pulse: 89   Resp: 22   Temp: 97.8 F (36.6 C)   TempSrc: Oral   SpO2: 97%   Weight: 83.8 kg (184 lb 12.8 oz)     There is no height or weight on file to calculate BMI.    General: Able to speak in complete sentences without tachypnea or accessory muscle use. Wearing 6L NC, in wheelchair.  HEENT: pupils equal with EOMI; no thyromegaly, no cervical LN, no thrush.   Cardiovascular: regular rate and rhythm, no murmurs, rubs or gallops. No P2/S2 present. No R sided heave. No JVD, no HJR.   Lungs: clear to auscultation bilaterally, without wheezing, rhonchi, or rales.   Abdomen: soft, non-tender, non-distended; no palpable masses, no hepatosplenomegaly, normoactive bowel sounds, no rebound or guarding   Extremities: no clubbing or cyanosis. 1+ BLE edema.   Neuro: Normal sensory and motor systems. Able to ambulate.   Derm: No rashes.   Musculoskeletal: nl ROM, no muscle weakness.     Labs:    CMP  1330 11/18/18   CREATININE 1.3* 1.2*   eGFR 11/18/2018 52 mL/min/1.73sq m  AST 21 22   ALT 24 19   WBC 7.0 6.4   HGB 11.5* 12.3   MCV 93 92   PLT 283 299         ILD panel  Marker Result Date   ANA Pattern     ANA titer 1:640    ANA screen (IFA)     Anti-DNA (DS) Ab     SSA  (Ro)Ab     SSB (La) Ab     RF 14 (<14)    Anti-CCP elevated    JO-1 Ab     SCL-70 Ab negative    Anti-centromere     Sm/RNP Ab 25    C-reactive Protein     BNP      Ro52 positive 12/2017     Diagnostic Testing     09/16/19 HRCT chest: moderate mosaic  attenuation throughout the lungs worsening on expiratory phase imaging. Findings may reflect air trapping, less likely small vessel disease.   Unchanged findings of pulmonary hypertension. Diffuse esophageal thickening, slightly worsened from prior exam.   Small hiatal hernia. Interval near complete resolution of previous multifocal patchy airspace disease.   Slight interval worsening of mediastinal lymphadenopathy. Scattered pulmonary nodules, unchanged from prior exam.     09/20/2019 Cardiac MRI:  LVEF 35%, SV 34mL, LVEDV 98mL, LVESV 64mL  RVEF 40%, SV 48mL, RVEDV , EVESV 64mL  Impression:  1. 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 Would consider coronary disease with previous apical infarction as another possible etiology.  2. Moderately reduced left ventricular function with apical akinesis  3. The right ventricle is normal in size with mildly reduced function  4. The left atrium is moderately dilated  5. The main pulmonary artery is mildly dilated  6. Transmural enhancement of the left ventricular apex. May represent scar formation in the setting of apical hypertrophic cardiomyopathy verses prior apical infarct.    11/24/2019 LLE Duplex: negative for DVT    V/Q scan  negative 3/22 June 2017 high-resolution CT scan of the chest images independently reviewed showing patchy groundglass throughout with air trapping but no clear evidence of underlying interstitial lung disease. Some evidence of emphysema seen, motion degraded images    October 2019 high-resolution CT scan of the chest: Prominently dilated main pulmonary artery, some patchy groundglass attenuation throughout both lungs, somewhat centrilobular, scattered subpleural reticulation differential diagnosis includes NSIP versus UIP. Some centrilobular emphysema noted, air trapping noted. Images independently reviewed, there is definitely moderate centrilobular emphysema  and an upper lobe predominant fashion as well as centrilobular groundglass      Cath  Date 09/13/19 06/2017 06/2016   Meds Opsumit, Uptravi     RA (mmHg) 6 3 4    RV (mmHg) 56/7     PA systolic (mmHg) 56 49 69   PA diastolic (mmHg) 23 16 23    PA mean (mmHg)  29 38   PCWP (mmHg) 13 10 8    LVEDP (mmHg)      CO (Fick) 3.49  4.15   CI (Fick) 1.89     CO (TD) 4.26  3.14   CI (TD) 2.3     PVR      MVO2          Echo  Date 06/23/19 6/19 3/19 3/18   RA       RV Normal size. RVSF moderately reduced  Normal size and systolic fxn    RVSP 50-60      TR jet velocity       LA       LV 50-55% EF normal EF 65-70% EF 65-70%   Other  bubble study negative Apical HCM  Severe LAE Apical HCM. 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 Her CT does not show severe changes aside from mosaic attenuation and no signs of fibrosis.  - Her level of dyspnea does not fully correlate with the changes on CT.  - It's unclear if Imuran is absolutely necessary for her. She has been on 100mg  Qhs, we will decrease to 50mg  daily and re-evaluate.  - Her MSK complaints are largely related to gout.    PAH  - She was diagnosed with group 1 in 2015 based on RHC, but given her  severity of diastolic CHF and now possible decreased EF seen on cardiac MRI, she likely has a strong  group 2 component.  - she has been on Opsumit and Uptravi for 1-2 years, unclear if she received any significant benefit when starting these. Opsumit was discontinued last week by [facility] pulmonologist Dr. April Holding which we agree with. The pulmonary vasodilators may be worsening her overall condition if the majority of her PAH is due to cardiac dysfunction.  - Ordered 3D echo  - ordered DECT to further evaluate for CTEPH. No history of blood clots but her hypoxia is disproportionate to her PAH and ILD.  - recommend RHC with exercise.  - recommend decreasing O2 supplementation; she'd likely tolerate 2-4L rather than 6L at rest and with exertion.  - Consider tapering the uptravi dose depending on findings of RHC and TTE    CHF  - known diastolic dysfunction for years, has been on Lasix though unable to take it regularly due to hypotension.  - Cardiac MRI showed decreased EF to 35% with apical aneurysm, not seen on 2D echo that was done one week prior. We have ordered 3D echo for confirmation.  - Recommend she see cardiology here at [facility] for further management after RHC and 3D echo completed. She may need defibrillator given low EF, currently has PPM.     F/u in 2 months with and labs    Signed by: Dellis Anes, MD   Date/Time: 12/30/2019 10:09 AM      I personally saw and evaluated the patient.  I agree with the above note.      Sela Hua, MD

## 2019-12-30 NOTE — Progress Notes (Signed)
12/30/19 1023   Six Minute Walk   Status Completed   $ Pulm Stress Test Performed? Simple Pulm Stress Test - CPT 361 612 0108   Oximeter probe site Forehead   Resting RA SpO2 85 %   Baseline FiO2 / O2  6L NC   Baseline SpO2  100 %   Baseline HR 71   Baseline BP 113/75   Baseline BORG level 1   1 minute distance in feet 80 feet   1 minute distance in meters 24.4 meters   1 minute SpO2 97 %   1 minute heart rate 89 BPM   1 minute BORG level 3   2 minute SpO2 96 %   2 minute heart rate 97 BPM   2 minute BORG level 3   3 minute distance in feet 215 feet   3 minute distance in meters 65.5 meters   3 minute SpO2 96 %   3 minute heart rate 90 BPM   3 minute BORG level 4   Pause 34 secs total   Early termination (min) 5:10   Early termination distance in feet 450 feet   Early termination distance in meters 137.2 meters   Early termination SpO2 95 %   Early termination heart rate 82 BPM   Early termination BORG level 4   Recovery time (min) 1   Recovery FiO2 / O2  6L NC   Recovery BP 103/70   Recovery SpO2 97 %   Recovery heart rate 75 BPM   Recovery BORG level 5   Reason stopped   (Pt felt her legs were very tired and wobbly.)   Supportive devices Walker w/wheels   Adverse Reactions Dyspnea

## 2019-12-30 NOTE — Patient Instructions (Addendum)
Please return to [facility] clinic to see Dr. Brooke Dare in 2 months with the following tests:   -6 min walk please start at 2L and titrate as needed    -3D Echo with bubble study   -labs    Medications: Continue current medications as prescribed   -Please decrease Imuran to 50 mg daily     Please complete the following prior to your next visit:   -Please get RHC with exercise with [facility] ([facility] will call you to set this up.They can also be reached at [phone].)   -Please get Dual Engergy CTA done in the next few weeks at [facility]/Script given    -Please f/u up with a sleep doctor regarding evaluating CPAP settings/there is a sleep doctor at [facility]   -Please get flu vaccine when possible.    -Continue to try to stay active, exercise daily. We perfer you restart Pulmonary rehab      ATTENTION : Please do not contact our office Through MyChart. Please contact us via the numbers and emails listed below.    Jerelene Redden, RN, BSN     [phone]  [phone]      [email or web address]  [facility] Advanced Lung Disease and Transplant Program      For appointments and scheduling needs, contact the front desk [phone] or Synthia Innocent at [phone]    Please Call Pulmonary Diagnostics at [phone] to schedule , Cleda Daub or PFTs / Please call cardiac Diagnostics at [phone] to schedule Echo    *For issues concerning oxygen or durable medical equipment, contact your nurse coordinator  *For issues concerning disability and/or work letters please contact our social worker Nunzio Cobbs LCSW at [phone] or [email or web address]  *For issues concerning nutrition, please contact our dietitian Charleston Poot at [phone] and Hamilton Capri at [phone]   *For issues concerning insurance and bill related issues please contact our finance coordinator Calton Dach at [phone] or [email or web address]    *For after hours EMERGENCY issues, call the [facility] operator at  [phone] and ask to have the On-call Lung Transplant Coordinator paged  *For research and clinical studies please visit: [email or web address]

## 2020-01-02 NOTE — Progress Notes (Signed)
Per Dr. Ledora Bottcher request, TC to patient, advised her   Her TSH is low.  She should recheck and get free T4 also.  Other labs are okay.  We should check this before the DECT.  If she is hyperthyroid and gets contrast it makes the thyroid situation worse    Patient verbalized understanding, stated she will call her PCP to discuss, and will not schedule the DECT until after she f/u with them. Patient denies any other questions or concerns at this time.

## 2020-01-04 DIAGNOSIS — Z23 Encounter for immunization: Secondary | ICD-10-CM | POA: Diagnosis not present

## 2020-01-04 DIAGNOSIS — Z993 Dependence on wheelchair: Secondary | ICD-10-CM | POA: Diagnosis not present

## 2020-01-04 DIAGNOSIS — Z9981 Dependence on supplemental oxygen: Secondary | ICD-10-CM | POA: Diagnosis not present

## 2020-01-04 DIAGNOSIS — E0590 Thyrotoxicosis, unspecified without thyrotoxic crisis or storm: Secondary | ICD-10-CM | POA: Diagnosis not present

## 2020-01-04 DIAGNOSIS — J439 Emphysema, unspecified: Secondary | ICD-10-CM | POA: Diagnosis not present

## 2020-01-06 ENCOUNTER — Encounter (INDEPENDENT_AMBULATORY_CARE_PROVIDER_SITE_OTHER): Payer: Self-pay

## 2020-01-06 DIAGNOSIS — I442 Atrioventricular block, complete: Secondary | ICD-10-CM | POA: Diagnosis not present

## 2020-01-06 DIAGNOSIS — Z950 Presence of cardiac pacemaker: Secondary | ICD-10-CM | POA: Diagnosis not present

## 2020-01-09 NOTE — Progress Notes (Signed)
Received a call from patient stating that she feels like she is getting slightly worse since the visit she had with [name] on 9/24 . She is complaining of feeling more SOB mostly with activity. She was on 2-4LNC and increase to [facility]. She walked on 4L and was able to manage stats. She is now stating that she is desaturating on 5L into the 80s. Bp is still running low 96/69..   The changes that were made at her appt were to stop the opsumit and decrease Imuran from 100 to 50 daily. She did f/u with PCP regarding TSH and her synthroid was adjusted and plans to see endocrinologist.    Patient thinks that maybe stoping the Opsumit was the issue, please advise on your thoughts    Per Dr. Brooke Dare patient should go to ER to be seen. Advised patient as per his thoughts. Patient verbalized understanding, relies on [facility] for assistance and they just left for the day. Patient stated she is stable enough she feels to wait until tomorrow and will come to [facility] ER. Advised patient to call me to let me know when she is coming.

## 2020-01-11 LAB — CBC PATHOLOGIST REVIEW

## 2020-01-17 ENCOUNTER — Ambulatory Visit
Admission: RE | Admit: 2020-01-17 | Discharge: 2020-01-17 | Disposition: A | Discharge status: Home / Self Care | Payer: Medicare Other | Source: Ambulatory Visit | Attending: Cardiovascular Disease | Admitting: Cardiovascular Disease

## 2020-01-17 ENCOUNTER — Encounter: Payer: Self-pay | Admitting: Cardiovascular Disease

## 2020-01-17 ENCOUNTER — Encounter
Admission: RE | Disposition: A | Discharge status: Home / Self Care | Payer: Self-pay | Source: Ambulatory Visit | Attending: Cardiovascular Disease

## 2020-01-17 DIAGNOSIS — Z538 Procedure and treatment not carried out for other reasons: Secondary | ICD-10-CM | POA: Insufficient documentation

## 2020-01-17 DIAGNOSIS — I272 Pulmonary hypertension, unspecified: Secondary | ICD-10-CM | POA: Insufficient documentation

## 2020-01-17 SURGERY — RIGHT HEART CATH ONLY INCL. CO AND SATS
Anesthesia: Local | Laterality: Right

## 2020-01-17 MED ORDER — SODIUM CHLORIDE 0.9 % IV SOLN
INTRAVENOUS | Status: DC
Start: 2020-01-17 — End: 2020-01-17

## 2020-01-17 NOTE — Progress Notes (Addendum)
outpatient admitted  to [facility] ROOM 10  For RHC with exercise , pt is alert and oriented x 3, patient oriented to unit and plan of care. Pt has been NPO since midnight last night  , pt's safety maintained. Call bell within pt reach. Pt is Jehovah's Witness.    Pre- Cath Teaching and Learning Objectives   Learner: [name],  Preference for learning: Verbal  Teaching Method: Verbal Instruction   Outcome of Learning: Fully Achieved    Described/Demonstrated the following:     + Responsibilities of patient's care  + Cardiac Cath/PTCA/Stent/Brachytherapy   + Purpose of procedure  + Need to be NPO pre-procedure  + Need for maintaining bedrest & straight leg post-procedure & sheath removal.  + Necessary fluid intake after procedure  + Symptoms of bleeding & states plan to notify nurse.

## 2020-01-17 NOTE — Progress Notes (Signed)
Cancel procedure   and will be rescheduled procedure. Discharge home with pt' daughter

## 2020-01-24 ENCOUNTER — Encounter: Payer: Self-pay | Admitting: Cardiovascular Disease

## 2020-01-24 ENCOUNTER — Ambulatory Visit
Admission: RE | Admit: 2020-01-24 | Discharge: 2020-01-24 | Disposition: A | Discharge status: Home / Self Care | Payer: Medicare Other | Source: Ambulatory Visit | Attending: Cardiovascular Disease | Admitting: Cardiovascular Disease

## 2020-01-24 ENCOUNTER — Encounter
Admission: RE | Disposition: A | Discharge status: Home / Self Care | Payer: Self-pay | Source: Ambulatory Visit | Attending: Cardiovascular Disease

## 2020-01-24 DIAGNOSIS — I480 Paroxysmal atrial fibrillation: Secondary | ICD-10-CM | POA: Insufficient documentation

## 2020-01-24 DIAGNOSIS — J9611 Chronic respiratory failure with hypoxia: Secondary | ICD-10-CM | POA: Insufficient documentation

## 2020-01-24 DIAGNOSIS — I5030 Unspecified diastolic (congestive) heart failure: Secondary | ICD-10-CM

## 2020-01-24 DIAGNOSIS — G4733 Obstructive sleep apnea (adult) (pediatric): Secondary | ICD-10-CM | POA: Insufficient documentation

## 2020-01-24 DIAGNOSIS — I272 Pulmonary hypertension, unspecified: Secondary | ICD-10-CM

## 2020-01-24 DIAGNOSIS — Z7901 Long term (current) use of anticoagulants: Secondary | ICD-10-CM | POA: Insufficient documentation

## 2020-01-24 DIAGNOSIS — Z8616 Personal history of COVID-19: Secondary | ICD-10-CM | POA: Insufficient documentation

## 2020-01-24 DIAGNOSIS — Z8701 Personal history of pneumonia (recurrent): Secondary | ICD-10-CM | POA: Insufficient documentation

## 2020-01-24 DIAGNOSIS — J449 Chronic obstructive pulmonary disease, unspecified: Secondary | ICD-10-CM | POA: Insufficient documentation

## 2020-01-24 DIAGNOSIS — I2721 Secondary pulmonary arterial hypertension: Secondary | ICD-10-CM | POA: Insufficient documentation

## 2020-01-24 DIAGNOSIS — J849 Interstitial pulmonary disease, unspecified: Secondary | ICD-10-CM | POA: Insufficient documentation

## 2020-01-24 DIAGNOSIS — R0600 Dyspnea, unspecified: Secondary | ICD-10-CM

## 2020-01-24 DIAGNOSIS — Z9981 Dependence on supplemental oxygen: Secondary | ICD-10-CM | POA: Insufficient documentation

## 2020-01-24 SURGERY — RIGHT HEART CATH ONLY INCL. CO AND SATS
Anesthesia: Local | Laterality: Right

## 2020-01-24 MED ORDER — APIXABAN 5 MG PO TABS
5.0000 mg | ORAL_TABLET | Freq: Two times a day (BID) | ORAL | 11 refills | Status: DC
Start: 2020-01-25 — End: 2020-03-25

## 2020-01-24 MED ORDER — LIDOCAINE HCL 1 % IJ SOLN
INTRAMUSCULAR | Status: AC | PRN
Start: 2020-01-24 — End: 2020-01-24
  Administered 2020-01-24: 1 mL via SUBCUTANEOUS

## 2020-01-24 MED ORDER — LIDOCAINE HCL (PF) 1 % IJ SOLN
INTRAMUSCULAR | Status: AC
Start: 2020-01-24 — End: ?
  Filled 2020-01-24: qty 30

## 2020-01-24 MED ORDER — HEPARIN (PORCINE) IN NACL 2-0.9 UNIT/ML-% IJ SOLN (WRAP)
INTRAVENOUS | Status: AC
Start: 2020-01-24 — End: ?
  Filled 2020-01-24: qty 500

## 2020-01-24 MED ORDER — HEPARIN (PORCINE) IN NACL 1000-0.9 UT/500ML-% IV SOLN - TABLE FLUSH (SEDATION NARRATOR)
INTRAVENOUS | Status: AC | PRN
Start: 2020-01-24 — End: 2020-01-24
  Administered 2020-01-24: 1000 [IU]

## 2020-01-24 SURGICAL SUPPLY — 8 items
CATH SWAN-GANZ C-TIP 6F 110CM (Catheter) ×1
CATHETER PA PLMR SG CNTRCTH 6FR 110CM (Catheter) ×1
CATHETER PULMONARY ARTERY L110 CM (Catheter) ×1
CATHETER PULMONARY ARTERY L110 CM THERMODILUTION C TIP OD6 FR (Catheter) IMPLANT
KIT INTRO SS HDRPH .021IN 35MM FLX STRG (Sheaths) ×1
KIT INTRODUCER L10 CM .021 IN L35 MM (Sheaths) ×1
KIT INTRODUCER L10 CM .021 IN L35 MM FLEX STRAIGHT SHEATH DILATOR (Sheaths) IMPLANT
SHEATH GLIDE SLNDR SS 6F 10CM (Sheaths) ×1

## 2020-01-24 NOTE — Progress Notes (Signed)
CATH LAB PROCEDURE HANDOFF REPORT    Date Time: 01/24/20 8:13 AM    INDICATIONS:    pulmonary hypertension  POST PROCEDURE DEBRIEF:    Right heart cath    Instrument/sponge/needle counts, if applicable:  N/A  Specimen labeling, read labels aloud, including name:  N/A  Any applicable equipment problems to be addressed:  N/A    Per MD:  Key concerns for recovery/management: bleeding/hematoma  ALLERGIES:    Amiodarone, Cellcept [mycophenolate], and Zyban [bupropion]   ACC BLEEDING RISK SCORE   Total Score: 60 = HIGH RISK for bleeding (greater than 3.1%)   MEDICAL HISTORY:      Past Medical History:   Diagnosis Date    A-fib     Cardiomyopathy     CKD (chronic kidney disease)     Congestive heart failure     COPD (chronic obstructive pulmonary disease)     COVID-19     Disorder of thyroid     Gastrointestinal hemorrhage     HTN (hypertension)     NSTEMI (non-ST elevated myocardial infarction)     During LHC    Obesity     OSA on CPAP     PNA (pneumonia)       ACCESS:    98F sheath in right brachial vein  Hemostasis: Manual pressure at 0810  Post procedure pulses: n/a   Visual appearance: clean/dry/intact with good distal pulses   MEDICATIONS:      VITALS:    HR:70           Rhythm: sinus rhythm       BP: 149/76   O2 SAT: 100%        PROCEDURE DETAILS:    Outcomes: See physician note                                Last ACT: No results found for: ISTATACTKAOL       Final Chest Pain Assessment::0/10    Report given to: Sharma Covert RN    See Physicians Op note/ Report for details

## 2020-01-24 NOTE — H&P (Addendum)
H&P UPDATE WITH ASA/MALLAMPATTI    Date Time: 01/24/20 6:59 AM    PROCEDURE:    Right heart cath, Exercise Challenge  INDICATIONS:    pulmonary hypertension    74 yo F COPD, OSA, chronic hypoxemic respiratory failure/home O2, PAFib post-ablation/NOAC (on hold pre-procedure), PAH, CTD-ILD, COVID PNA 2021, Jehovah's Witness, normal vs. moderately reduced EF via different imaging modalities, pending Cardiology evaluation in [place], followed by [facility], referred for RHC with Exercise Challenge.    H&P:    The history and physical including past medical, family, and social history were reviewed   and there are no significant interval changes from what is currently available in the chart  from prior evaluation. She has no complaints.  She was seen and examined by me prior   to the procedure.   ALLERGIES:    Amiodarone, Cellcept [mycophenolate], and Zyban [bupropion]   LABS:      Lab Results   Component Value Date    WBC 3.66 12/30/2019    HGB 11.4 12/30/2019    HCT 37.0 12/30/2019    PLT 252 12/30/2019    NA 140 12/30/2019    K 4.7 12/30/2019    CL 111 12/30/2019    CO2 18 (L) 12/30/2019    BUN 19.0 12/30/2019    CREAT 1.0 12/30/2019    EGFR >60.0 12/30/2019    GLU 73 12/30/2019     ASA PHYSICAL STATUS    Class 3 - Severe systemic disease, limits normal activity but not incapacitating  MALLAMPATTI AIRWAY CLASSIFICATION    Class III: Soft and hard palate and base of the uvula are visible  ACC BLEEDING RISK SCORE   Total Score: 60 = HIGH RISK for bleeding (greater than 3.1%)  PLANNED SEDATION:    ( ) NO SEDATION   (x) MODERATE SEDATION   ( ) DEEP SEDATION WITH ANESTHESIA   CONCLUSION:    The risks, benefits and alternatives of the procedure have been discussed in detail and   she has indicated that she understands the procedure, indications, and risks inherent to the   procedure and is amenable to proceeding.  All questions were answered. Informed   consent was signed and verified.      Signed by:  Burna Forts, MD

## 2020-01-24 NOTE — Discharge Instr - AVS First Page (Addendum)
Interventional Cardiovascular Admission and Recovery  Catheterization Discharge Instructions            Access Site: Right Brachial Vein    Activity:  1. Do not lift anything greater than ten (10) pounds and no strenuous activity for 24 hours.  2. Avoid weight bearing in the affected arm and prevent flexion, extension and manipulation of the wrist area for at least 24 hours unless otherwise instructed by the physician   3. Ask your doctor when you should return to work. Usually you can return within 48 hours for desk jobs and within 3 (three) to five (5) days for jobs requiring heavy labor.    • Gradually increase your level of activity back to normal depending on how you feel.   • You are encouraged to walk as much as you feel up to it, walking is a great way to increase your endurance. Remember to allow for rest periods.       Access Site Care:  1. You may shower 24 hours after your procedure.  Leave the bandage in place and let the water passively flow over the site.  You may shower daily.   2. After 24 hours REMOVE the dressing before or during your shower.  Again, let the water passively flow over the site, wash gently with mild soap and water using your hand, then pat the area dry.    3. Do not submerge access site in water (dishwashing, tub bath, pool, etc) until completely healed (usually five (5) days).   4. Do not rub, pick or scratch the area.   5. Do not apply creams, powders, lotions, or ointments to the site.   6. Observe for signs of infection:  redness, warmth, swelling, drainage, or temperature greater than 100.4 degrees F.  If you suspect infection call the doctor who performed the procedure.  7. Monitor for bleeding and swelling. If either occurs, sit or lie down, apply manual pressure directly over the access and call the doctor who performed the procedure.     Normal Observation:  1. You may feel tenderness at the puncture site.  May take ACETAMINOPHEN (TYLENOL) if needed.  May also use ice and  elevation for site discomfort.  2. You may experience some mild bruising.      Call 9-1-1 if:  1. If you are unable to stop the bleeding with manual pressure. An arterial bleed may become an emergency if left unattended.   2. Your fingers/hand/arm has a loss of normal sensation, becomes cold, numb, painful or grayish in color.   3. You are experiencing unrelieved chest pain.        Follow up:  • Schedule your follow-up appointment with your doctor.

## 2020-01-24 NOTE — Progress Notes (Signed)
Pt was able to eat & ambulate. Pt denies any pain, site unremarkable. Discharge checklist and instructions complete according to protocol. Pt discharged on home 6lpm O2 via [place], with daughter as the ride back home Pt is NAD at this time.

## 2020-01-24 NOTE — UM Notes (Signed)
Initial Review  Class: Outpatient/Ambulatory    Summary:  The patient is a 74 year old female with a history of COPD, OSA, chronic hypoxemic respiratory failure/home O2, PAfib post-ablation/NOAC (on hold pre-procedure), PAH, CTD-ILD, COVID PNA 2021, normal vs moderately reduced EF who presented for a right heart cath with exercise challenge.    Vitals:  Temp:  [97.6 F (36.4 C)-98 F (36.7 C)] 98 F (36.7 C)  Heart Rate:  [70-74] 74  Resp Rate:  [20] 20  BP: (129-133)/(55-79) 133/79  SpO2: 94-97% on 4-6L O2  Weight: 84.1 kg      Plan of Care:  -  Discharge home  -  Resume NOAC tomorrow    Doree Albee RN, BSN  UR Case Manager  [facility]  [email or web address]  8:50 AM  01/24/2020

## 2020-01-24 NOTE — Progress Notes (Signed)
Patient arrived to [facility] 15 from cath lab S/P RHC. Right Brachial  access site with 4X4 dressing. Site CDI. No bleeding, No hematoma at site. Palpable Radial  pulse.  Pt identification verified. Pt is A&Ox4, pain 0/10. VSS. Hemostasis instructions were given to patient. Pt verbalized understanding of post procedure care.. Call bell within reach. Will monitor patient closely.

## 2020-01-24 NOTE — Discharge Instructions (Signed)
Interventional Cardiovascular Admission and Recovery  Catheterization Discharge Instructions            Access Site: Right Brachial Vein    Activity:  1. Do not lift anything greater than ten (10) pounds and no strenuous activity for 24 hours.  2. Avoid weight bearing in the affected arm and prevent flexion, extension and manipulation of the wrist area for at least 24 hours unless otherwise instructed by the physician   3. Ask your doctor when you should return to work. Usually you can return within 48 hours for desk jobs and within 3 (three) to five (5) days for jobs requiring heavy labor.     Gradually increase your level of activity back to normal depending on how you feel.    You are encouraged to walk as much as you feel up to it, walking is a great way to increase your endurance. Remember to allow for rest periods.       Access Site Care:  1. You may shower 24 hours after your procedure.  Leave the bandage in place and let the water passively flow over the site.  You may shower daily.   2. After 24 hours REMOVE the dressing before or during your shower.  Again, let the water passively flow over the site, wash gently with mild soap and water using your hand, then pat the area dry.    3. Do not submerge access site in water (dishwashing, tub bath, pool, etc) until completely healed (usually five (5) days).   4. Do not rub, pick or scratch the area.   5. Do not apply creams, powders, lotions, or ointments to the site.   6. Observe for signs of infection:  redness, warmth, swelling, drainage, or temperature greater than 100.4 degrees F.  If you suspect infection call the doctor who performed the procedure.  7. Monitor for bleeding and swelling. If either occurs, sit or lie down, apply manual pressure directly over the access and call the doctor who performed the procedure.     Normal Observation:  1. You may feel tenderness at the puncture site.  May take ACETAMINOPHEN (TYLENOL) if needed.  May also use ice and  elevation for site discomfort.  2. You may experience some mild bruising.      Call 9-1-1 if:  1. If you are unable to stop the bleeding with manual pressure. An arterial bleed may become an emergency if left unattended.   2. Your fingers/hand/arm has a loss of normal sensation, becomes cold, numb, painful or grayish in color.   3. You are experiencing unrelieved chest pain.        Follow up:   Schedule your follow-up appointment with your doctor.

## 2020-01-24 NOTE — Procedures (Signed)
Brief Cardiac Catheterization Note:    Please refer to the complete Procedural Note under "Cardiac Procedures" tab in EPIC for full details    74 yo F COPD, OSA, chronic hypoxemic respiratory failure/home O2, PAFib post-ablation/NOAC (on hold pre-procedure), PAH/Pulm HTN, CTD-ILD, COVID PNA 2021, Jehovah's Witness, normal vs. moderately reduced EF via different imaging modalities, pending Cardiology evaluation in [place], followed by [facility], referred for RHC with Exercise Challenge.    Conclusions:    1.  Severe resting Pulm HTN.  2.  Normal resting PCWP.  3.  Severely reduced CO/CI.  4.  Mildly elevated PCWP with Exercise: only able to exercise to Stage 1 (2 minutes).  5.  Hemodynamics: Baseline: RA 10. RV 79/5. PA 83/38/53. PCW 16/16/14. BP (cuff) 150/71/102. Sats: RA 49%, PA 52%, AO (Pulse-Ox) 100%, 6L NC O2. Fick CO/CI 3.1/1.7, TD CO/CI 1.9/1.0. Exercise (2 minutes bicycle/Stage 1): PA 87/48/63. PCW 23/23/21. Sats: PA 19%, AO (Pulse-Ox) 100%, 6L NC O2. Fick CO/CI 1.9/1.0.  6.  No complications.  7.  Pain-free/hemodynamically-stable post-procedure.  8.  Right brachial vein sheath removed and hemostasis achieved with manual compression.    Recommendations:    1.  Discharge home.  2.  Resume NOAC tomorrow.  3.  Outpatient Cardiology consultation as already scheduled.  4.  Ongoing [facility] follow-up as scheduled.          --------------------  Burna Forts, MD  Interventional Cardiology  [facility]  SL [phone]

## 2020-01-24 NOTE — Nursing Progress Note (Signed)
Pt admitted to [facility] bed 6 for RHC with Dr. Idolina Primer  Checked ID arm band.  Pt alert and awake, SpO2 98. Vital signs stable.  Npo since midnight.  Allergies: Amiodarone, Cellcept, Zyban  Inserted IV in the right and left arm.  Checked pulses.     Right Heart Catheterization Teaching and Learning Objectives   Learner: [name]  Preference for learning: Corporate treasurer Method: Verbal Instruction   Outcome of Learning: Fully Achieved     Described/Demonstrated the following:      + Responsibilities of patient's care  + RHC   + Purpose of procedure  + Need to be NPO pre-procedure  + Symptoms of bleeding & states plan to notify nurse  +Completed 1 liner updated and consents prior to procedure

## 2020-01-24 NOTE — Progress Notes (Signed)
Received report and patient into [facility] s/p Right Heart Cath. Assumed patient's care. ID band verified, alert and oriented x 3, VSS, pain 0 /10,  Call bell within reach.     Post- Cath Teaching and Learning Objectives   Learner: [name],   Preference for learning: Verbal  Teaching Method: Verbal Instruction, Written Material   Outcome of Learning: Fully Achieved    Described/Demonstrated the following:   + Safe and effective use of medications  + Modified diet and nutritional counseling  + Drug/food interactions  + Method for applying pressure to cath site when coughing, laughing & voiding post-procedure

## 2020-01-25 ENCOUNTER — Encounter: Payer: Self-pay | Admitting: Cardiovascular Disease

## 2020-01-25 DIAGNOSIS — D649 Anemia, unspecified: Secondary | ICD-10-CM | POA: Diagnosis not present

## 2020-01-25 DIAGNOSIS — E538 Deficiency of other specified B group vitamins: Secondary | ICD-10-CM | POA: Diagnosis not present

## 2020-01-30 ENCOUNTER — Encounter: Payer: Self-pay | Admitting: Internal Medicine

## 2020-01-30 DIAGNOSIS — E538 Deficiency of other specified B group vitamins: Secondary | ICD-10-CM | POA: Diagnosis not present

## 2020-01-30 DIAGNOSIS — D649 Anemia, unspecified: Secondary | ICD-10-CM | POA: Diagnosis not present

## 2020-01-30 NOTE — Progress Notes (Signed)
I reviewed the RHC data in my inbox today.  Markedly reduced CI.  Uncertainty over if this is due to [facility] or biV failure given recent cardiac MRI findings.  Regardless the prognosis is quite guarded.  Unable to reach patient but left a message.  Was able to contact pt's daughter and recommended inpt admission for new TTE and optimization of therapy

## 2020-01-31 NOTE — Progress Notes (Signed)
Per MD Request/ Called Access and spoke with [facility] regarding DA for tomorrow on 10/27 for patient S/p RHC with low CI. She will work on getting a bed and call with bed information.     14:40  Just got notification from Access patient will be going to [facility] Rm [id number]

## 2020-02-01 ENCOUNTER — Inpatient Hospital Stay (HOSPITAL_COMMUNITY): Payer: Medicare Other

## 2020-02-01 ENCOUNTER — Encounter: Payer: Self-pay | Admitting: Internal Medicine

## 2020-02-01 ENCOUNTER — Inpatient Hospital Stay
Admission: AD | Admit: 2020-02-01 | Discharge: 2020-02-04 | DRG: 315 | Disposition: A | Discharge status: Home / Self Care | Payer: Medicare Other | Source: Ambulatory Visit | Attending: Internal Medicine | Admitting: Internal Medicine

## 2020-02-01 DIAGNOSIS — Z515 Encounter for palliative care: Secondary | ICD-10-CM

## 2020-02-01 DIAGNOSIS — Z7901 Long term (current) use of anticoagulants: Secondary | ICD-10-CM

## 2020-02-01 DIAGNOSIS — I429 Cardiomyopathy, unspecified: Secondary | ICD-10-CM

## 2020-02-01 DIAGNOSIS — D8989 Other specified disorders involving the immune mechanism, not elsewhere classified: Secondary | ICD-10-CM | POA: Diagnosis present

## 2020-02-01 DIAGNOSIS — N189 Chronic kidney disease, unspecified: Secondary | ICD-10-CM | POA: Diagnosis present

## 2020-02-01 DIAGNOSIS — I270 Primary pulmonary hypertension: Principal | ICD-10-CM | POA: Diagnosis present

## 2020-02-01 DIAGNOSIS — I5042 Chronic combined systolic (congestive) and diastolic (congestive) heart failure: Secondary | ICD-10-CM | POA: Diagnosis present

## 2020-02-01 DIAGNOSIS — Z79890 Hormone replacement therapy (postmenopausal): Secondary | ICD-10-CM

## 2020-02-01 DIAGNOSIS — E669 Obesity, unspecified: Secondary | ICD-10-CM | POA: Diagnosis present

## 2020-02-01 DIAGNOSIS — Z6833 Body mass index (BMI) 33.0-33.9, adult: Secondary | ICD-10-CM

## 2020-02-01 DIAGNOSIS — I2721 Secondary pulmonary arterial hypertension: Secondary | ICD-10-CM

## 2020-02-01 DIAGNOSIS — I959 Hypotension, unspecified: Secondary | ICD-10-CM | POA: Diagnosis present

## 2020-02-01 DIAGNOSIS — M609 Myositis, unspecified: Secondary | ICD-10-CM | POA: Diagnosis present

## 2020-02-01 DIAGNOSIS — M109 Gout, unspecified: Secondary | ICD-10-CM | POA: Diagnosis present

## 2020-02-01 DIAGNOSIS — Z8616 Personal history of COVID-19: Secondary | ICD-10-CM

## 2020-02-01 DIAGNOSIS — I422 Other hypertrophic cardiomyopathy: Secondary | ICD-10-CM | POA: Diagnosis present

## 2020-02-01 DIAGNOSIS — R0609 Other forms of dyspnea: Secondary | ICD-10-CM | POA: Diagnosis present

## 2020-02-01 DIAGNOSIS — I472 Ventricular tachycardia: Secondary | ICD-10-CM | POA: Diagnosis present

## 2020-02-01 DIAGNOSIS — I442 Atrioventricular block, complete: Secondary | ICD-10-CM | POA: Diagnosis present

## 2020-02-01 DIAGNOSIS — R0602 Shortness of breath: Secondary | ICD-10-CM

## 2020-02-01 DIAGNOSIS — G4733 Obstructive sleep apnea (adult) (pediatric): Secondary | ICD-10-CM | POA: Diagnosis present

## 2020-02-01 DIAGNOSIS — Z950 Presence of cardiac pacemaker: Secondary | ICD-10-CM

## 2020-02-01 DIAGNOSIS — E039 Hypothyroidism, unspecified: Secondary | ICD-10-CM | POA: Diagnosis present

## 2020-02-01 DIAGNOSIS — Z87891 Personal history of nicotine dependence: Secondary | ICD-10-CM

## 2020-02-01 DIAGNOSIS — I252 Old myocardial infarction: Secondary | ICD-10-CM

## 2020-02-01 DIAGNOSIS — I130 Hypertensive heart and chronic kidney disease with heart failure and stage 1 through stage 4 chronic kidney disease, or unspecified chronic kidney disease: Secondary | ICD-10-CM | POA: Diagnosis present

## 2020-02-01 DIAGNOSIS — J449 Chronic obstructive pulmonary disease, unspecified: Secondary | ICD-10-CM | POA: Diagnosis present

## 2020-02-01 DIAGNOSIS — I480 Paroxysmal atrial fibrillation: Secondary | ICD-10-CM | POA: Diagnosis present

## 2020-02-01 DIAGNOSIS — I50814 Right heart failure due to left heart failure: Secondary | ICD-10-CM | POA: Diagnosis not present

## 2020-02-01 DIAGNOSIS — I4891 Unspecified atrial fibrillation: Secondary | ICD-10-CM | POA: Diagnosis not present

## 2020-02-01 DIAGNOSIS — I2510 Atherosclerotic heart disease of native coronary artery without angina pectoris: Secondary | ICD-10-CM | POA: Diagnosis not present

## 2020-02-01 DIAGNOSIS — I509 Heart failure, unspecified: Secondary | ICD-10-CM | POA: Diagnosis not present

## 2020-02-01 DIAGNOSIS — I272 Pulmonary hypertension, unspecified: Secondary | ICD-10-CM | POA: Diagnosis not present

## 2020-02-01 DIAGNOSIS — J849 Interstitial pulmonary disease, unspecified: Secondary | ICD-10-CM | POA: Diagnosis not present

## 2020-02-01 DIAGNOSIS — J9811 Atelectasis: Secondary | ICD-10-CM | POA: Diagnosis not present

## 2020-02-01 DIAGNOSIS — Z9981 Dependence on supplemental oxygen: Secondary | ICD-10-CM | POA: Diagnosis not present

## 2020-02-01 DIAGNOSIS — I519 Heart disease, unspecified: Secondary | ICD-10-CM | POA: Diagnosis not present

## 2020-02-01 DIAGNOSIS — Z136 Encounter for screening for cardiovascular disorders: Secondary | ICD-10-CM | POA: Diagnosis not present

## 2020-02-01 DIAGNOSIS — I517 Cardiomegaly: Secondary | ICD-10-CM | POA: Diagnosis not present

## 2020-02-01 DIAGNOSIS — I5030 Unspecified diastolic (congestive) heart failure: Secondary | ICD-10-CM | POA: Diagnosis not present

## 2020-02-01 DIAGNOSIS — Z8679 Personal history of other diseases of the circulatory system: Secondary | ICD-10-CM | POA: Diagnosis not present

## 2020-02-01 DIAGNOSIS — R918 Other nonspecific abnormal finding of lung field: Secondary | ICD-10-CM | POA: Diagnosis not present

## 2020-02-01 DIAGNOSIS — F32A Depression, unspecified: Secondary | ICD-10-CM | POA: Diagnosis not present

## 2020-02-01 DIAGNOSIS — I2729 Other secondary pulmonary hypertension: Secondary | ICD-10-CM | POA: Diagnosis not present

## 2020-02-01 DIAGNOSIS — M1A9XX0 Chronic gout, unspecified, without tophus (tophi): Secondary | ICD-10-CM | POA: Diagnosis not present

## 2020-02-01 LAB — HEPATIC FUNCTION PANEL
ALT: 9 U/L (ref 0–55)
AST (SGOT): 15 U/L (ref 5–34)
Albumin/Globulin Ratio: 1.4 (ref 0.9–2.2)
Albumin: 4 g/dL (ref 3.5–5.0)
Alkaline Phosphatase: 114 U/L — ABNORMAL HIGH (ref 37–106)
Bilirubin Direct: 0.2 mg/dL (ref 0.0–0.5)
Bilirubin Indirect: 0.4 mg/dL (ref 0.2–1.0)
Bilirubin, Total: 0.6 mg/dL (ref 0.2–1.2)
Globulin: 2.9 g/dL (ref 2.0–3.6)
Protein, Total: 6.9 g/dL (ref 6.0–8.3)

## 2020-02-01 LAB — CELL MORPHOLOGY
Cell Morphology: ABNORMAL — AB
Platelet Estimate: NORMAL

## 2020-02-01 LAB — CBC AND DIFFERENTIAL
Absolute NRBC: 0.05 10*3/uL — ABNORMAL HIGH (ref 0.00–0.00)
Basophils Absolute Automated: 0.04 10*3/uL (ref 0.00–0.08)
Basophils Automated: 0.7 %
Eosinophils Absolute Automated: 0.06 10*3/uL (ref 0.00–0.44)
Eosinophils Automated: 1.1 %
Hematocrit: 36.5 % (ref 34.7–43.7)
Hgb: 11.9 g/dL (ref 11.4–14.8)
Immature Granulocytes Absolute: 0.04 10*3/uL (ref 0.00–0.07)
Immature Granulocytes: 0.7 %
Lymphocytes Absolute Automated: 1 10*3/uL (ref 0.42–3.22)
Lymphocytes Automated: 18 %
MCH: 31.7 pg (ref 25.1–33.5)
MCHC: 32.6 g/dL (ref 31.5–35.8)
MCV: 97.3 fL — ABNORMAL HIGH (ref 78.0–96.0)
MPV: 10.1 fL (ref 8.9–12.5)
Monocytes Absolute Automated: 0.54 10*3/uL (ref 0.21–0.85)
Monocytes: 9.7 %
Neutrophils Absolute: 3.87 10*3/uL (ref 1.10–6.33)
Neutrophils: 69.8 %
Nucleated RBC: 0.9 /100 WBC — ABNORMAL HIGH (ref 0.0–0.0)
Platelets: 307 10*3/uL (ref 142–346)
RBC: 3.75 10*6/uL — ABNORMAL LOW (ref 3.90–5.10)
RDW: 24 % — ABNORMAL HIGH (ref 11–15)
WBC: 5.55 10*3/uL (ref 3.10–9.50)

## 2020-02-01 LAB — TROPONIN I: Troponin I: 0.05 ng/mL (ref 0.00–0.05)

## 2020-02-01 LAB — BASIC METABOLIC PANEL
Anion Gap: 10 (ref 5.0–15.0)
BUN: 19 mg/dL (ref 7.0–19.0)
CO2: 19 mEq/L — ABNORMAL LOW (ref 22–29)
Calcium: 9.3 mg/dL (ref 7.9–10.2)
Chloride: 111 mEq/L (ref 100–111)
Creatinine: 1.2 mg/dL — ABNORMAL HIGH (ref 0.6–1.0)
Glucose: 92 mg/dL (ref 70–100)
Potassium: 4.1 mEq/L (ref 3.5–5.1)
Sodium: 140 mEq/L (ref 136–145)

## 2020-02-01 LAB — CK: Creatine Kinase (CK): 84 U/L (ref 29–168)

## 2020-02-01 LAB — PT/INR
PT INR: 1.2 — ABNORMAL HIGH (ref 0.9–1.1)
PT: 14 s — ABNORMAL HIGH (ref 10.1–12.9)

## 2020-02-01 LAB — TSH: TSH: 2.25 u[IU]/mL (ref 0.35–4.94)

## 2020-02-01 LAB — B-TYPE NATRIURETIC PEPTIDE: B-Natriuretic Peptide: 92 pg/mL (ref 0–100)

## 2020-02-01 LAB — GFR: EGFR: 53

## 2020-02-01 LAB — T4, FREE: T4 Free: 0.87 ng/dL (ref 0.70–1.48)

## 2020-02-01 LAB — RHEUMATOID FACTOR: Rheumatoid Factor: <13 (ref 0.0–30.0)

## 2020-02-01 MED ORDER — GLUCOSE 40 % PO GEL
15.0000 g | ORAL | Status: DC | PRN
Start: 2020-02-01 — End: 2020-02-04

## 2020-02-01 MED ORDER — LEVOTHYROXINE SODIUM 50 MCG PO TABS
50.0000 ug | ORAL_TABLET | Freq: Every day | ORAL | Status: DC
Start: 2020-02-02 — End: 2020-02-04
  Administered 2020-02-02 – 2020-02-04 (×3): 50 ug via ORAL
  Filled 2020-02-01 (×3): qty 1

## 2020-02-01 MED ORDER — ACETAMINOPHEN 650 MG RE SUPP
650.0000 mg | Freq: Four times a day (QID) | RECTAL | Status: DC | PRN
Start: 2020-02-01 — End: 2020-02-04

## 2020-02-01 MED ORDER — GLUCAGON 1 MG IJ SOLR (WRAP)
1.0000 mg | INTRAMUSCULAR | Status: DC | PRN
Start: 2020-02-01 — End: 2020-02-04

## 2020-02-01 MED ORDER — MELATONIN 3 MG PO TABS
3.0000 mg | ORAL_TABLET | Freq: Every evening | ORAL | Status: DC | PRN
Start: 2020-02-01 — End: 2020-02-04

## 2020-02-01 MED ORDER — ACETAMINOPHEN 325 MG PO TABS
650.0000 mg | ORAL_TABLET | Freq: Four times a day (QID) | ORAL | Status: DC | PRN
Start: 2020-02-01 — End: 2020-02-04

## 2020-02-01 MED ORDER — NALOXONE HCL 0.4 MG/ML IJ SOLN (WRAP)
0.2000 mg | INTRAMUSCULAR | Status: DC | PRN
Start: 2020-02-01 — End: 2020-02-04

## 2020-02-01 MED ORDER — FERROUS SULFATE 324 (65 FE) MG PO TBEC
324.0000 mg | DELAYED_RELEASE_TABLET | ORAL | Status: DC
Start: 2020-02-01 — End: 2020-02-04
  Administered 2020-02-01 – 2020-02-03 (×2): 324 mg via ORAL
  Filled 2020-02-01 (×2): qty 1

## 2020-02-01 MED ORDER — VITAMIN D 25 MCG (1000 UT) PO TABS
50.0000 ug | ORAL_TABLET | Freq: Every day | ORAL | Status: DC
Start: 2020-02-01 — End: 2020-02-04
  Administered 2020-02-01 – 2020-02-04 (×4): 50 ug via ORAL
  Filled 2020-02-01 (×4): qty 2

## 2020-02-01 MED ORDER — ATORVASTATIN CALCIUM 80 MG PO TABS
80.0000 mg | ORAL_TABLET | Freq: Every evening | ORAL | Status: DC
Start: 2020-02-01 — End: 2020-02-04
  Administered 2020-02-01 – 2020-02-03 (×3): 80 mg via ORAL
  Filled 2020-02-01 (×3): qty 1

## 2020-02-01 MED ORDER — AZATHIOPRINE 50 MG PO TABS
50.0000 mg | ORAL_TABLET | Freq: Every day | ORAL | Status: DC
Start: 2020-02-01 — End: 2020-02-03
  Administered 2020-02-01 – 2020-02-02 (×2): 50 mg via ORAL
  Filled 2020-02-01 (×2): qty 1

## 2020-02-01 MED ORDER — MACITENTAN 10 MG PO TABS
10.0000 mg | ORAL_TABLET | Freq: Every day | ORAL | Status: DC
Start: 2020-02-01 — End: 2020-02-01

## 2020-02-01 MED ORDER — COLCHICINE 0.6 MG PO TABS
0.6000 mg | ORAL_TABLET | Freq: Every day | ORAL | Status: DC
Start: 2020-02-02 — End: 2020-02-04
  Administered 2020-02-02 – 2020-02-04 (×3): 0.6 mg via ORAL
  Filled 2020-02-01 (×3): qty 1

## 2020-02-01 MED ORDER — FUROSEMIDE 10 MG/ML IJ SOLN
20.0000 mg | Freq: Every day | INTRAMUSCULAR | Status: DC
Start: 2020-02-02 — End: 2020-02-02
  Administered 2020-02-02: 20 mg via INTRAVENOUS
  Filled 2020-02-01: qty 4

## 2020-02-01 MED ORDER — DEXTROSE 50 % IV SOLN
12.5000 g | INTRAVENOUS | Status: DC | PRN
Start: 2020-02-01 — End: 2020-02-04

## 2020-02-01 MED ORDER — SELEXIPAG 200 MCG PO TABS
1600.0000 ug | ORAL_TABLET | Freq: Two times a day (BID) | ORAL | Status: DC
Start: 2020-02-01 — End: 2020-02-04
  Administered 2020-02-01 – 2020-02-03 (×5): 1600 ug via ORAL
  Filled 2020-02-01 (×9): qty 8

## 2020-02-01 MED ORDER — MACITENTAN 10 MG PO TABS
10.0000 mg | ORAL_TABLET | Freq: Every day | ORAL | Status: DC
Start: 2020-02-01 — End: 2020-02-02
  Administered 2020-02-01 – 2020-02-02 (×2): 10 mg via ORAL
  Filled 2020-02-01 (×2): qty 1

## 2020-02-01 MED ORDER — APIXABAN 5 MG PO TABS
5.0000 mg | ORAL_TABLET | Freq: Two times a day (BID) | ORAL | Status: DC
Start: 2020-02-01 — End: 2020-02-04
  Administered 2020-02-01 – 2020-02-04 (×4): 5 mg via ORAL
  Filled 2020-02-01 (×6): qty 1

## 2020-02-01 MED ORDER — EZETIMIBE 10 MG PO TABS
10.0000 mg | ORAL_TABLET | Freq: Every evening | ORAL | Status: DC
Start: 2020-02-01 — End: 2020-02-04
  Administered 2020-02-01 – 2020-02-03 (×3): 10 mg via ORAL
  Filled 2020-02-01 (×3): qty 1

## 2020-02-01 MED ORDER — ALLOPURINOL 100 MG PO TABS
50.0000 mg | ORAL_TABLET | Freq: Every day | ORAL | Status: DC
Start: 2020-02-02 — End: 2020-02-03
  Administered 2020-02-02 – 2020-02-03 (×2): 50 mg via ORAL
  Filled 2020-02-01 (×2): qty 1

## 2020-02-01 NOTE — H&P (Signed)
ADMISSION HISTORY AND PHYSICAL EXAM    Date Time: 02/01/20 5:08 PM  Patient Name: 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  - Will consult rheum  - Order basic rheum panel including ANA, CK, aldolase, HIV, TSH, fT4, RNP, SSA, SSB, myositis panel, dsDNA, Scl-70, RF, CCP  - Order Imuran 50mg  qd per pulm  - Order HRCT    # Hx OSA on CPAP  - Likely contributing to above  - Did not bring home CPAP machine, had recent move and has not had compliance monitored but has an appointment with a sleep physician in November    # Hx pAfb s/p ablation c/b GIB and Jehova's Witness deferring transfusion  - On Eliquis at home, can continue  - Hx of GIB, initially thought to be 2/2 cellcept (taking for CTD-ILD) but had repeat GIB off of the medication - presumably diverticulitis no colonoscopy at present time. Treated with IV Iron with lowest Hgb 9.1.   - Daily CBC    # Hx COVID-19 PNA  - CT as above to eval for COVID fibrosis    # Hx Gout  - Continue home colchicine    # Hx Hypothyroidism  - TSH/fT4 as above  -  Continue home syntrhoid    # Hx CKD  - Baseline Cr 1.2 in careeveyrwhere  - Monitor with daily BMP    Disposition: (Please see PAF column for Expected D/C Date)   Today's date: 02/01/2020   Admit Date: 02/01/2020  4:16 PM  Service status: Inpatient: risk of morbidity and mortality and risk of progressive disease  Clinical Milestones: TBD  Anticipated discharge needs: TBD    History of Presenting Illness:   [name] is a 74 y.o. 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 In the past has had clear CTs, normal EF. However on recent cardiac MRI at [facility] she had depressed EF to 35% with possible apical aneurysm, also with recent RHC showed reduced CI.     She was referred to Dr. Elmer Bales group (recently moved to this area from [facility]) who subsequently advised her to present to [facility] for further care.  She endorses progressive SOB and DOE since COVID hospitalization March 2021. Some mild orthopnea. Mild LE edema. O/w She denies chest pain. No recent fevers, cough/congestion, abd pain, N/V/D, no other questions/cocnerns.    Past Medical History:     Past Medical History:   Diagnosis Date    A-fib     Cardiomyopathy     CKD (chronic kidney disease)     Congestive heart failure     COPD (chronic obstructive pulmonary disease)     COVID-19     Disorder of thyroid     Gastrointestinal hemorrhage     HTN (hypertension)     NSTEMI (non-ST elevated myocardial infarction)     During LHC    Obesity     OSA on CPAP     PNA (pneumonia)      Available old records reviewed, including:  Patient, epic, careeverywhere    Past Surgical History:     Past Surgical History:   Procedure Laterality Date    CARDIAC ABLATION      A-FIB ABLATION    CESAREAN SECTION      INSERT / REPLACE / REMOVE PACEMAKER       RIGHT HEART CATH ONLY INCL. CO AND SATS Right 01/24/2020    Procedure: RIGHT HEART CATH w/ exercise;  Surgeon: Burna Forts, MD;  Location: [facility] C[facility];  Service: Cardiovascular;  Laterality: Right;  same day discharge     Family History:   Father with hx of unknown lung CA but was smoker  Sister with SLE, goodpasture's syndrome    Social History:     Social History     Tobacco Use   Smoking Status Never Smoker   Smokeless Tobacco Never Used     Social History     Substance and Sexual Activity   Alcohol Use Never     Social History     Substance and Sexual Activity   Drug Use Never       Allergies:     Allergies   Allergen Reactions    Amiodarone Other (See Comments)     Lung toxicity    Cellcept [Mycophenolate]      Gastro hemorrhages     Zyban [Bupropion] Itching       Medications:     Home Medications     Med List Status: Complete Set By: Man[place], [place] E, RN at 02/01/2020  4:50 PM                allopurinol (ZYLOPRIM) 100 MG tablet     Take 50 mg by mouth daily Per pt taking 50mg  (half tab)        apixaban (ELIQUIS) 5 MG     Take 1 tablet (5 mg total) by mouth every 12 (twelve) hours     atorvastatin (LIPITOR) 80 MG tablet     Take 80 mg by mouth daily     azaTHIOprine (IMURAN) 50 MG tablet     Take 50 mg by mouth daily        Colchicine 0.6 MG capsule     Take 0.6 mg by mouth daily     ezetimibe (ZETIA) 10 MG tablet     Take 10 mg by mouth daily     ferrous sulfate 325 (65 FE) MG tablet     Take 325 mg by mouth Once every Monday, Wednesday and Friday morning Taking on Monday wed, friday        furosemide (LASIX) 40 MG tablet  Take 40 mg by mouth daily Per pt she takes 40mg  and 20 mg alternating daily , took 20 mg on 01/16/2020       levothyroxine (SYNTHROID) 100 MCG tablet     Take 50 mcg by mouth Once a day at 6:00am        macitentan (OPSUMIT) 10 MG tablet     Take 10 mg by mouth daily     OXYGEN-HELIUM IN     Inhale into the lungs     potassium chloride (K-DUR) 10 MEQ tablet     Take 10  mEq by mouth     Selexipag (Uptravi) 1600 MCG Tab     Take 1,600 mg by mouth 2 (two) times daily     vitamin D (CHOLECALCIFEROL) 25 MCG (1000 UT) tablet     Take 2,000 Units by mouth daily        Method by which medications were confirmed on admission: patient    Review of Systems:   All other systems were reviewed and are negative except: SOB    Physical Exam:     Patient Vitals for the past 24 hrs:   BP Temp Temp src Pulse Resp SpO2 Height Weight   02/01/20 1701 104/64 97.3 F (36.3 C)  71 18  1.575 m (5' 2.01") 82.3 kg (181 lb 6.4 oz)   02/01/20 1637 104/64 97.3 F (36.3 C) Oral 66  96 %     02/01/20 1634       1.575 m (5' 2.01") 82.3 kg (181 lb 6.4 oz)     Body mass index is 33.17 kg/m.  No intake or output data in the 24 hours ending 02/01/20 1708    General: awake, alert, oriented x 3; no acute distress.  HEENT: perrla, eomi, sclera anicteric  oropharynx clear without lesions, mucous membranes moist  Neck: supple, no lymphadenopathy, no thyromegaly, no JVD, no carotid bruits  Cardiovascular: regular rate and rhythm, no murmurs, rubs or gallops  Lungs: slight diffuse crackles but o/w without wheezing, rhonchi, or rales  Abdomen: soft, non-tender, non-distended; no palpable masses, no hepatosplenomegaly, normoactive bowel sounds, no rebound or guarding  Extremities: no clubbing, cyanosis, or edema  Neuro: cranial nerves grossly intact, strength 5/5 in upper and lower extremities, sensation intact,   Skin: no rashes or lesions noted  Other:       Labs:     Results     ** No results found for the last 24 hours. **          Imaging personally reviewed, including: CXR    Safety Checklist  DVT prophylaxis:  CHEST guideline (See page [id number]) Chemical and Mechanical   Foley:  [place] Rn Foley protocol Not present   IVs:  Peripheral IV   PT/OT: Ordered   Daily CBC & or Chem ordered:  SHM/ABIM guidelines (see #5) Yes, due to clinical and lab instability   Reference for approximate charges of common labs: CBC  auto diff - $76   BMP - $99   Mg - $79    Signed by: Sofie Hartigan, MD, MD   VW:[id number], Despina Arias, MD

## 2020-02-01 NOTE — Plan of Care (Signed)
Admission Dual Skin Assessment    Bony Areas Redness?  Y/N Blanchable? Y/N  If no- place WOCN consult (indicate if placed) Open?  Y/N Diameter  if applicable Comments   Back of Head n       Side of Head n       EARS   Ear (R) n       Ear (L) n       SHOULDERS   Shoulder (R) n       Shoulder (L) n       ELBOWS   Elbow (R) n       Elbow (L) n       HIPS   Hip (R) n       Hip (L) n       Sacrum n       THIGHS   Thigh (R) n       Thigh (L) n       KNEES   Knee (R) n       Knee (L) n       ANKLES   Ankle (R) n       Ankle (L) n       HEELS   Heel (R) n       Heel (L) n       DEVICES   Underneath n       Above n       Other: L upper chest scar    2 RN skin assessment performed w/Gabby, RN          Problem: Moderate/High Fall Risk Score >5  Goal: Patient will remain free of falls  02/01/2020 1753 by Zierra Laroque, [place] E, RN  Outcome: Progressing    Problem: Inadequate Gas Exchange  Goal: Adequate oxygenation and improved ventilation  02/01/2020 1753 by Braylee Bosher, [place] E, RN  Outcome: Progressing     Problem: Impaired Mobility  Goal: Mobility/Activity is maintained at optimal level for patient  02/01/2020 1753 by Synthia Fairbank, [place] E, RN  Outcome: Progressing    Problem: Hemodynamic Status: Cardiac  Goal: Stable vital signs and fluid balance  Outcome: Progressing     Problem: Ineffective Gas Exchange  Goal: Effective breathing pattern  Outcome: Progressing

## 2020-02-01 NOTE — Consults (Addendum)
Reason for Visit:   Known PAH with a markedly reduced cardiac index    No chief complaint on file.    History of Present Illness  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  Patient had her initial care at [facility].  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    Summary of issues:   PAH group 1: Diagnosed with PAH via RHC 05/2013, currently on Uptravi and Opsumit. Previously was on Adempas and Sildenafil but did not tolerate.    Diastolic CHF: She also has diastolic heart failure with  Multiple echos showing severely dilated LA apical hypertrophic cardiomyopathy and preserved EF 65-70% as recently as 09/2019. 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 Started Imuran 04/2018, up to 100mg  Qhs.  She also has gout which is her major MSK complaints, is on allopurinol and colchicine.    OSA on CPAP: Diagnosed 2014 but PSG, does not believe she's seen a sleep doctor since that time, has not received a new machine since then. She says her pressure st 14cmH2O. She wears 6LNC while also wearing the CPAP mask, does not run the O2 through the CPAP itself. Has not had her compliance monitored.    Paroxysmal Afib s/p ablation and GIB: Previously was on eliquis though has had two episodes of GIB, most recently in 11/2019. She was off Eliquis prior to the 8/21 event. She is Jehovah's witness so received IV Iron, was admitted at [facility]. Lowest Hgb was 9.1g/dL, increased to 57.8I/ON at time of discharge. She says she has been hypotensive since this admission. Her baseline Hgb prior to GIB was 14g/dL. She was not scoped during this admission.    COVID-19 pneumonia  - 06/2019 with week long hospitalization    1.4.1 Connective tissue diseases?  PH work-up Date Pertinent  Results/Comments   CTD serologies Had at [facility] - repeat    LFTs 12/30/19    HIV needs    Tox screen needs    TFTs 12/30/19 Needs ft4   PFTs 12/30/19    HRCT 09/16/19 Mild emphysema; mosaicism   CT angiogram     V/Q scan 06/2016    Sleep study/overnight oximetry needs    Echo 06/23/19    RHC 01/24/20    NO challenge       Pregnancy Prevention Strategy for Females - postmenopausal    Review of Systems    Constitutional: Negative for fever, chills, night sweats, malaise/fatigue. Weight has been stable.   HEENT: Negative for nosebleeds, congestion, thrush, sore throat and ear discharge.   Eyes: Negative for blurred vision, pain and discharge.   Respiratory: No significant cough. Negative for hemoptysis, shortness of breath as per HPI.   Cardiovascular: Negative for chest pain, palpitations and leg swelling. No syncope.  Gastrointestinal: Negative for heartburn, nausea, vomiting and abdominal pain.   Genitourinary: Negative for dysuria, urgency and frequency.   Musculoskeletal: No joint pains or swelling. No Raynaud's.   Skin: Negative for itching and rash.   Neurological: Negative for dizziness, tingling, weakness and headaches.   Otherwise, a complete review of systems was performed and found unremarkable.    Past Medical History:   Diagnosis Date    A-fib     Cardiomyopathy  CKD (chronic kidney disease)     Congestive heart failure     COPD (chronic obstructive pulmonary disease)     COVID-19     Disorder of thyroid     Gastrointestinal hemorrhage     HTN (hypertension)     NSTEMI (non-ST elevated myocardial infarction)     During LHC    Obesity     OSA on CPAP     PNA (pneumonia)        Past Surgical History:   Procedure Laterality Date    CARDIAC ABLATION      A-FIB ABLATION    CESAREAN SECTION      INSERT / REPLACE / REMOVE PACEMAKER      RIGHT HEART CATH ONLY INCL. CO AND SATS Right 01/24/2020    Procedure: RIGHT HEART CATH w/ exercise;  Surgeon: Burna Forts, MD;  Location: [facility] CARDIAC CATH;   Service: Cardiovascular;  Laterality: Right;  same day discharge       Summary of Pulmonary Vascular Disease Risk Factors  History of DVT or PE: No  History of connective tissue disease: yes  History of heart failure:  No  History of pulmonary edema: No  History of diastolic dysfunction: yes  History of valvular disease: No  History of blood dyscrasia: No  History of COPD: yes  History of interstitial lung disease: No?  History of sleep disordered breathing: yes  History of prescription diet drug use: No  History of over the counter diet drug use: No  History of amphetamine or amphetamine derivative use: No  History of illicit drug use or other toxin exposure: No  History of HIV: No  History of miscarriage: No  History of blood transfusions: No    Current Medications  No current facility-administered medications on file prior to encounter.     Current Outpatient Medications on File Prior to Encounter   Medication Sig Dispense Refill    allopurinol (ZYLOPRIM) 100 MG tablet Take 50 mg by mouth daily Per pt taking 50mg  (half tab)         apixaban (ELIQUIS) 5 MG Take 1 tablet (5 mg total) by mouth every 12 (twelve) hours 60 tablet 11    atorvastatin (LIPITOR) 80 MG tablet Take 80 mg by mouth daily      azaTHIOprine (IMURAN) 50 MG tablet Take 50 mg by mouth daily         Colchicine 0.6 MG capsule Take 0.6 mg by mouth daily      ezetimibe (ZETIA) 10 MG tablet Take 10 mg by mouth daily      ferrous sulfate 325 (65 FE) MG tablet Take 325 mg by mouth Once every Monday, Wednesday and Friday morning Taking on Monday wed, friday         furosemide (LASIX) 40 MG tablet Take 40 mg by mouth daily Per pt she takes 40mg  and 20 mg alternating daily , took 20 mg on 01/16/2020        levothyroxine (SYNTHROID) 100 MCG tablet Take 50 mcg by mouth Once a day at 6:00am         OXYGEN-HELIUM IN Inhale into the lungs      potassium chloride (K-DUR) 10 MEQ tablet Take 10 mEq by mouth      Selexipag (Uptravi) 1600 MCG Tab Take  1,600 mg by mouth 2 (two) times daily      vitamin D (CHOLECALCIFEROL) 25 MCG (1000 UT) tablet Take 2,000 Units by mouth daily  PH Medications Start date Current dose Stop Date Reason stopped   uptravi   1600 mg bid     opsumit  10 mg                     Allergies   Allergen Reactions    Amiodarone Other (See Comments)     Lung toxicity    Cellcept [Mycophenolate]      Gastro hemorrhages     Zyban [Bupropion] Itching       Family History   Problem Relation Age of Onset    Alzheimer's disease Mother     Lung cancer Father        Social History     Socioeconomic History    Marital status: Single     Spouse name: Not on file    Number of children: Not on file    Years of education: Not on file    Highest education level: Not on file   Occupational History    Not on file   Tobacco Use    Smoking status: Never Smoker    Smokeless tobacco: Never Used   Vaping Use    Vaping Use: Never used   Substance and Sexual Activity    Alcohol use: Never    Drug use: Never    Sexual activity: Not on file   Other Topics Concern    Not on file   Social History Narrative    Not on file     Social Determinants of Health     Financial Resource Strain:     Difficulty of Paying Living Expenses: Not on file   Food Insecurity:     Worried About Running Out of Food in the Last Year: Not on file    Ran Out of Food in the Last Year: Not on file   Transportation Needs:     Lack of Transportation (Medical): Not on file    Lack of Transportation (Non-Medical): Not on file   Physical Activity:     Days of Exercise per Week: Not on file    Minutes of Exercise per Session: Not on file   Stress:     Feeling of Stress : Not on file   Social Connections:     Frequency of Communication with Friends and Family: Not on file    Frequency of Social Gatherings with Friends and Family: Not on file    Attends Religious Services: Not on file    Active Member of Clubs or Organizations: Not on file    Attends Tax inspector Meetings: Not on file    Marital Status: Not on file   Intimate Partner Violence:     Fear of Current or Ex-Partner: Not on file    Emotionally Abused: Not on file    Physically Abused: Not on file    Sexually Abused: Not on file   Housing Stability:     Unable to Pay for Housing in the Last Year: Not on file    Number of Places Lived in the Last Year: Not on file    Unstable Housing in the Last Year: Not on file       Exam  There were no vitals taken for this visit.  Estimated body mass index is 33.91 kg/m as calculated from the following:    Height as of 01/24/20: 1.575 m (5\' 2" ).    Weight as of 01/24/20: 84.1 kg (185 lb 6.4 oz).    Weight  Monitoring 12/30/2019 01/17/2020 01/24/2020   Height - 158.8 cm 157.5 cm   Height Method - Stated Stated   Weight 83.825 kg 81.647 kg 84.097 kg   Weight Method - Actual Standing Scale   BMI (calculated) - 32.5 kg/m2 34 kg/m2         General: awake, alert, oriented x 3; no acute distress; 4 L NC  HEENT: pupils equal with EOMI; no thyromegaly, no cervical LN, no thrush  Cardiovascular: regular rate and rhythm, no murmurs  Lungs: clear to auscultation bilaterally, without wheezing, rhonchi, or rales  Abdomen: soft, non-tender, non-distended  Extremities: no clubbing, cyanosis, or edema  Neuro: normal sensory and motor systems and able to ambulate  Derm: no rashes  Musculoskeletal: nl ROM, no muscle weakness    Cumulative Data  Labs    Lab Results   Component Value Date    WBC 3.66 12/30/2019    HGB 11.4 12/30/2019    HCT 37.0 12/30/2019    MCV 98.7 (H) 12/30/2019    PLT 252 12/30/2019     Lab Results   Component Value Date    BNP 180 (H) 12/30/2019       No results found for: ANA    Lab Results   Component Value Date    TSH 0.05 (L) 12/30/2019      Lab Results   Component Value Date    WBC 3.66 12/30/2019    HGB 11.4 12/30/2019    HCT 37.0 12/30/2019    MCV 98.7 (H) 12/30/2019    PLT 252 12/30/2019     Lab Results   Component Value Date    CREAT 1.0 12/30/2019     BUN 19.0 12/30/2019    NA 140 12/30/2019    K 4.7 12/30/2019    CL 111 12/30/2019    CO2 18 (L) 12/30/2019                   ILD panel  Marker Result Date   ANA Pattern     ANA titer 1:640    ANA screen (IFA)     Anti-DNA (DS) Ab     SSA  (Ro)Ab     SSB (La) Ab     RF 14 (<14)    Anti-CCP elevated    JO-1 Ab     SCL-70 Ab negative    Anti-centromere     Sm/RNP Ab 25    C-reactive Protein     BNP      Ro52 positive 12/2017     09/16/19 HRCT chest: moderate mosaic attenuation throughout the lungs worsening on expiratory phase imaging. Findings may reflect air trapping, less likely small vessel disease.   Unchanged findings of pulmonary hypertension. Diffuse esophageal thickening, slightly worsened from prior exam.   Small hiatal hernia. Interval near complete resolution of previous multifocal patchy airspace disease.   Slight interval worsening of mediastinal lymphadenopathy. Scattered pulmonary nodules, unchanged from prior exam.     09/20/2019 Cardiac MRI:  LVEF 35%, SV 34mL, LVEDV 98mL, LVESV 64mL  RVEF 40%, SV 48mL, RVEDV , EVESV 64mL  Impression:  1. 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 Would consider coronary disease with previous apical infarction as another possible etiology.  2. Moderately reduced left ventricular function with apical akinesis  3. The right ventricle is normal in size with mildly reduced function  4. The left atrium is moderately dilated  5. The main pulmonary artery  is mildly dilated  6. Transmural enhancement of the left ventricular apex. May represent scar formation in the setting of apical hypertrophic cardiomyopathy verses prior apical infarct.    11/24/2019 LLE Duplex: negative for DVT    V/Q scan negative 3/22 June 2017 high-resolution CT scan of the chest images independently reviewed showing patchy groundglass throughout with air trapping but no clear evidence of  underlying interstitial lung disease. Some evidence of emphysema seen, motion degraded images    October 2019 high-resolution CT scan of the chest: Prominently dilated main pulmonary artery, some patchy groundglass attenuation throughout both lungs, somewhat centrilobular, scattered subpleural reticulation differential diagnosis includes NSIP versus UIP. Some centrilobular emphysema noted, air trapping noted. Images independently reviewed, there is definitely moderate centrilobular emphysema and an upper lobe predominant fashion as well as centrilobular groundglass      Cath  Date 01/24/20 09/13/19 06/2017 06/2016   Meds uptravi Opsumit, Uptravi     RA (mmHg) 10 6 3 4    RV (mmHg) 79/5 56/7     PA systolic (mmHg) 83 56 49 69   PA diastolic (mmHg) 38 23 16 23    PA mean (mmHg) 53  29 38   PCWP (mmHg) 14 13 10 8    LVEDP (mmHg)       CO (Fick) 3.1 3.49  4.15   CI (Fick) 1.7 1.89     CO (TD) 1.9 4.26  3.14   CI (TD) 1.0 2.3     PVR 12.5 fick      MVO2 49          Echo  Date 06/23/19 6/19 3/19 3/18   RA       RV Normal size. RVSF moderately reduced  Normal size and systolic fxn    RVSP 50-60      TR jet velocity       LA       LV 50-55% EF normal EF 65-70% EF 65-70%   Other  bubble study negative Apical HCM  Severe LAE Apical HCM. Severe LAE, PASP        Date  12/30/19 04/2018 01/2017 07/2016   Oxygen 6L unknown RA RA   Rest sat 100; 85% ra      SpO2 nadir 96      SpO2 end 96      Distance 137 m - stopped early 188 229 109   Rest pulse 71      Max pulse 90      Pulse rate recovery 8      Max Borg 5          PFTs  Date:  12/30/19 01/01/18 3/18   FVC 1.95 (86) 1.88 (89) 1.81 (87)   FEV1 1.38 (79) 1/33 (83)    FEV1/FVC 78 83    TLC  4.02 (84)    RV      RV/TLC      DLco  4.75 (22) 7.5 (32)     Reveal Risk Score    Reveal 2.0    score   WHO Group 1  Subroup CTD-PAH  +1 PoPH  +3 Heritable  +2 1          demographics  Female Age > 60  +2  0           comorbidoties  eGFR<60cc/min/1.47m2  +1  0          FC I  -1 III  +1 IV  +2 1  VS SBP<166mmHg  +1  HR>96bpm  +1 0          All Cause hospitalization  within 6 months  +1  1          ?485m  -2 320 to <463m  -1 <144m  +1 1            BNP <50 or  NTproBNP<300  -2 200 to <800    +1 ?800 or  NT-proBNP?1100  +2 0          ECHO  Effusion  +1  ?          PFTS  DLCO<40%  +1  1          RHC  RAP >14mmHg  +1 w/n 1 year PVR<5WU  -2 0             Sum of above 5       +6      Risk score 11      Score Risk 12 month mortality   4-5    6 Very low risk    Low risk   <2.6%   7-8 Intermediate risk 6.2-7%   ?9 High risk >10.7%       Assessment/Plan   74 y.o. 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  Very low index on recent RHC.  Unclear if biV or R sided dysfunction driving this    PH Group: 1  Contributory comorbidities: 2, 3.  Consider 4  WHO Functional class: 3-4  Reveal: 11  ESC/ERS Risk score: intermediate to high    Recommendations  - daily weights, I/o  - check BNP  - HRCT  - 3d TTE  - Continue opsumit 10 mg daily  - Continue selexipag 1600 mg bid  - Lasix 20 mg IV  - ana, ck, aldo, hiv, tsh, ft4, rnp, ssa, ssb, myositis panel, ds dna, scl-70, rf, ccp  - rheum consultation  - Imuran 50 mg daily  - Decision on augmentation of PH therapy will be largely driven by TTE  - Reassess for ILD, consider need for further treatment.  On most recent outside CTs no appreciable ILD.    - Consider repeat imaging for CTEPH    Preventive Health   [name] was advised to follow up with PCP for age-appropriate vaccinations.  Testing Date Comments   Influenza     Pneumovax     Prevnar 13     Covid      Advanced Directive     Palliative Care  N/A       Tamala Julian, MD, MD  02/01/2020 11:37 AM

## 2020-02-01 NOTE — H&P (Addendum)
Attending Attestation:     Discussed with house staff Dr. Selena Batten. Full H&P to follow.     Briefly, this is a 74 yo female with group I PAH, COPD on CPAP and baseline O2 4-6 L NC, PAF s/p ablation, diastolic heart failure and hypertrophic cardiomyopathy and possible ILD, prior GIB who presents as a direct admission for decreased cardiac index and dyspnea on exertion.     Patient recently moved here from [place] from [facility] and is establishing care. Patient notes she had an MI in 2013 and had afib at that time. Subsequent work up was notable for [facility]. Initially was unclear of the etiology, but currently there is concern for CTD-ILD. She had a RHC 3/19 with lower PA pressure, normal R and L heart filling pressure, but CO was low.   Echo bubble study was negative. CCP and Ro-52 positive. 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 She was trialed on prednisone and cellcept which was stopped after a GI bleed. She was then started on azathioprine.     Patient moved here in March and was hospitalized for a week with COVID-19. She notes that since then, has had more fatigue and dyspnea on exertion. She had a cardiac MRI at [facility] that showed decreased EF 35% with possible apical aneurysm.     Patient has established care with Dr. Brooke Dare in the advanced lung clinic. She had a recent RHC 01/24/20 which showed severe pulmonary HTN and severe reduced CO/CI.       Vitals: 97.3   71    18   104/64  Exam: NAD, RRR no m/r/g.     Labs: Reviewed. CBC wnl. Creatinine 1.2    Imaging: none    RHC 10/19: Severe resting pulmonary HTN, normal wedge pressure at rest, severely reduced CO/CI (1.9/1.0)    Assessment:  # Dyspnea on exertion: Multifactorial; possibly secondary to severe PAH, BiV or R sided heart failure, ILD, fibrosis in the setting of COVID earlier this year  # Chronic hypoxic respiratory failure: on 4 L NC  (4-6 L with exertion)  # Severe PAH  # Cardiomyopathy, EF 35%  # COPD on CPAP  # PAF s/p ablation  # Prior GIB    Plan:  Check EKG and CXR  Check high res CT  Check TTE  Continue opsumnit 10 mg daily  Continue selexipag 1600 mg BID  Strict I/O, daily weight  Continue lasix 20 mg IV    Rheum consult - Dr. Selena Batten to send epic chat  Continue imuran 50 mg daily    Advanced lung following; appreciate recs    Date of service: 02/01/2020.    Meade Maw, MD

## 2020-02-02 ENCOUNTER — Inpatient Hospital Stay: Payer: Medicare Other

## 2020-02-02 ENCOUNTER — Encounter: Payer: Self-pay | Admitting: Pulmonary Disease

## 2020-02-02 ENCOUNTER — Encounter: Payer: Self-pay | Admitting: Hospitalist

## 2020-02-02 ENCOUNTER — Inpatient Hospital Stay (HOSPITAL_COMMUNITY): Payer: Medicare Other

## 2020-02-02 DIAGNOSIS — I509 Heart failure, unspecified: Secondary | ICD-10-CM

## 2020-02-02 DIAGNOSIS — I272 Pulmonary hypertension, unspecified: Secondary | ICD-10-CM

## 2020-02-02 DIAGNOSIS — I50814 Right heart failure due to left heart failure: Secondary | ICD-10-CM

## 2020-02-02 DIAGNOSIS — M1A9XX0 Chronic gout, unspecified, without tophus (tophi): Secondary | ICD-10-CM

## 2020-02-02 DIAGNOSIS — I2729 Other secondary pulmonary hypertension: Secondary | ICD-10-CM

## 2020-02-02 DIAGNOSIS — I519 Heart disease, unspecified: Secondary | ICD-10-CM

## 2020-02-02 DIAGNOSIS — J849 Interstitial pulmonary disease, unspecified: Secondary | ICD-10-CM

## 2020-02-02 DIAGNOSIS — I422 Other hypertrophic cardiomyopathy: Secondary | ICD-10-CM

## 2020-02-02 DIAGNOSIS — Z8679 Personal history of other diseases of the circulatory system: Secondary | ICD-10-CM

## 2020-02-02 DIAGNOSIS — I480 Paroxysmal atrial fibrillation: Secondary | ICD-10-CM

## 2020-02-02 DIAGNOSIS — Z136 Encounter for screening for cardiovascular disorders: Secondary | ICD-10-CM

## 2020-02-02 DIAGNOSIS — I4891 Unspecified atrial fibrillation: Secondary | ICD-10-CM

## 2020-02-02 DIAGNOSIS — Z515 Encounter for palliative care: Secondary | ICD-10-CM

## 2020-02-02 LAB — COMPREHENSIVE METABOLIC PANEL
ALT: 9 U/L (ref 0–55)
AST (SGOT): 14 U/L (ref 5–34)
Albumin/Globulin Ratio: 1.2 (ref 0.9–2.2)
Albumin: 3.6 g/dL (ref 3.5–5.0)
Alkaline Phosphatase: 107 U/L — ABNORMAL HIGH (ref 37–106)
Anion Gap: 11 (ref 5.0–15.0)
BUN: 21 mg/dL — ABNORMAL HIGH (ref 7.0–19.0)
Bilirubin, Total: 0.5 mg/dL (ref 0.2–1.2)
CO2: 16 mEq/L — ABNORMAL LOW (ref 22–29)
Calcium: 9 mg/dL (ref 7.9–10.2)
Chloride: 114 mEq/L — ABNORMAL HIGH (ref 100–111)
Creatinine: 1 mg/dL (ref 0.6–1.0)
Globulin: 2.9 g/dL (ref 2.0–3.6)
Glucose: 96 mg/dL (ref 70–100)
Potassium: 3.9 mEq/L (ref 3.5–5.1)
Protein, Total: 6.5 g/dL (ref 6.0–8.3)
Sodium: 141 mEq/L (ref 136–145)

## 2020-02-02 LAB — ECHOCARDIOGRAM ADULT WITH CONTRAST COMP W CLR/DOP
AV Area (Cont Eq VTI): 1.924
AV Area (Cont Eq VTI): 1.93
AV Mean Gradient: 3
AV Peak Velocity: 132
Ao Root Diameter (2D): 3
BP Mod LV Ejection Fraction: 39.9
IVS Diastolic Thickness (2D): 0.835
LA Dimension (2D): 5.4
LA Volume Index (BP A-L): 0.072
LVID diastole (2D): 4.68
LVID systole (2D): 3.36
MV Area (PHT): 3.993
MV E/A: 1.3
MV E/A: 1.332
MV E/e' (Average): 18.465
Prox Ascending Aorta Diameter: 3.1
Pulmonary Valve Findings: NORMAL
RV Basal Diastolic Dimension: 3.66
RV Systolic Pressure: 81
RV Systolic Pressure: 86.854
TAPSE: 0.716
TAPSE: 0.966
Tricuspid Valve Findings: NORMAL

## 2020-02-02 LAB — ANTI-DNA ANTIBODY, DOUBLE-STRANDED
Double Stranded DNA(dsDNA)Antibody Interpretation: NEGATIVE
Double Stranded DNA(dsDNA)Antibody: 73 U/mL (ref 0–200)

## 2020-02-02 LAB — CBC
Absolute NRBC: 0.05 10*3/uL — ABNORMAL HIGH (ref 0.00–0.00)
Hematocrit: 37.5 % (ref 34.7–43.7)
Hgb: 12.2 g/dL (ref 11.4–14.8)
MCH: 32 pg (ref 25.1–33.5)
MCHC: 32.5 g/dL (ref 31.5–35.8)
MCV: 98.4 fL — ABNORMAL HIGH (ref 78.0–96.0)
MPV: 9.6 fL (ref 8.9–12.5)
Nucleated RBC: 0.8 /100 WBC — ABNORMAL HIGH (ref 0.0–0.0)
Platelets: 280 10*3/uL (ref 142–346)
RBC: 3.81 10*6/uL — ABNORMAL LOW (ref 3.90–5.10)
RDW: 24 % — ABNORMAL HIGH (ref 11–15)
WBC: 6.42 10*3/uL (ref 3.10–9.50)

## 2020-02-02 LAB — ANA IFA W/REFLEX TO TITER/PATTERN/AB
ANA Screen: POSITIVE — AB
ANA Titer: 1:640 {titer} — AB

## 2020-02-02 LAB — TROPONIN I: Troponin I: 0.05 ng/mL (ref 0.00–0.05)

## 2020-02-02 LAB — RIBONUCLEOPROTEIN (RNP) ANTIBODY
Ribonucleoprotein (RNP) Antibody Interpretation: NEGATIVE
Ribonucleoprotein (RNP) Antibody: 6 U/mL (ref 0–19)

## 2020-02-02 LAB — SCLERODERMA (SCL-70) ANTIBODY
Scleroderma (Scl-70) Antibody Interpretation: NEGATIVE
Scleroderma (Scl-70) Antibody: 11 U/mL (ref 0–19)

## 2020-02-02 LAB — MAGNESIUM: Magnesium: 1.7 mg/dL (ref 1.6–2.6)

## 2020-02-02 LAB — GFR: EGFR: >60

## 2020-02-02 LAB — HIV-1/2 AG/AB 4TH GEN. W/ REFLEX: HIV Ag/Ab, 4th Generation: NONREACTIVE

## 2020-02-02 MED ORDER — SPIRONOLACTONE 25 MG PO TABS
25.0000 mg | ORAL_TABLET | Freq: Two times a day (BID) | ORAL | Status: DC
Start: 2020-02-02 — End: 2020-02-04
  Administered 2020-02-02 – 2020-02-04 (×4): 25 mg via ORAL
  Filled 2020-02-02 (×4): qty 1

## 2020-02-02 MED ORDER — FUROSEMIDE 10 MG/ML IJ SOLN
20.0000 mg | Freq: Two times a day (BID) | INTRAMUSCULAR | Status: DC
Start: 2020-02-03 — End: 2020-02-03
  Administered 2020-02-03 (×2): 20 mg via INTRAVENOUS
  Filled 2020-02-02 (×2): qty 4

## 2020-02-02 MED ORDER — PERFLUTREN PROTEIN A MICROSPH IV SUSP
3.0000 mL | Freq: Once | INTRAVENOUS | Status: AC
Start: 2020-02-02 — End: 2020-02-02
  Administered 2020-02-02: 3 mL via INTRAVENOUS
  Filled 2020-02-02: qty 3

## 2020-02-02 MED ORDER — IOHEXOL 350 MG/ML IV SOLN
100.0000 mL | Freq: Once | INTRAVENOUS | Status: AC | PRN
Start: 2020-02-02 — End: 2020-02-02
  Administered 2020-02-02: 100 mL via INTRAVENOUS

## 2020-02-02 NOTE — Consults (Signed)
[facility] Palliative Medicine & Comprehensive Care  Service Phone Number: FX: [phone], Mon-Fri 9a-4p / [place] Pager: [phone] (24/7)     Palliative Care Consult   Date Time: 02/02/20 3:31 PM   Patient Name: [name], [name]   Location: [id number]   Attending Physician: Carloyn Manner, MD   Primary Care Physician: Georgeanna Lea, MD   Consulting Provider: Namon Cirri, MD   Consulting Service: Palliative Medicine and Comprehensive Care  Consulted request from Carloyn Manner, MD to see patient regarding:   Reason for Referral: Psychosocial or spiritual support; Clarify goals of care     Palliative Diagnosis Category: Cardiac and Pulmonary  Patient Type: New     Assessment & Plan   Impression   [name] 74 y.o. female with PAH on home O2, PAF s/p ablation, dCHF and hypertrophic cardiomyopathy and question of ILD as well as reports if auto-immune lung condition who was admitted after RHC revealed decreased CI and worsening DOE.        Estimated Prognosis: Unclear         Recommendations   1. Goals of Care:   - Pt reports that she is "optimistic but realistic"  - Would like to dicuss advanced care planning with daughters who are reluctant  - Values independence and planning  - OK with CPR, but "not if I am going to have brain damage and won't recover"  - Does not want to be on long term life support, but would be OK with ventilator for short term (5 days) if it is thought that she would be eventually extubatable at the time of intubation  - Has completed 5 wishes at outside institution and will bring in  - Provided jehovah's witness list of acceptable blood products (declines blood, plasma and platelets)  - states that she had completed an mPOA listing a friend who has since moved away and would like to update it      Code Status:       - Full Code      Advance Care Planning:  ACP Validation: Advance care planning discussion initialized and Copy of existing advance care planning  documents requested  Medical Decision Maker: Next of Kin status: daughter: Claude Manges [phone]  ACP Document: None    2. Psychosocial: Will ask palliative care LCSW to see patient to help with reviewing and obtaining already completing documents, coordinate meeting with daughters and completion of any new needed documents     4. Spiritual: Jehovah's Witness.   - Palliative Medicine Chaplain Adela Glimpse, [facility] available for support    5. PC Team follow-up plans: tomorrow      Discharge Disposition: Home      Outpatient Follow Up Recommended: Yes    Outcomes: Family meetings, Clarified goals of care, Provided advance care planning and ACP counseling assistance  70 minutes.  Total time on unit today in care of [facility]   including chart review, face to face evaluation and management. >50% of time spent in counseling & coordination of care with Patient and referring provider with recommendations above.       Start Time:  1220                 Stop Time:   1240  Start Time:   1350                Stop Time:   1440      Beryle Lathe  Graciela Husbands, MD, MD  Palliative Medicine & Comprehensive Care  Phone Number: FX: [phone], Mon-Fri 9a-4p / [place] Pager: [phone] (24/7)     History of Presenting Illness   [name] is a 74 y.o. female admitted to hospital on 02/01/2020 with Pulmonary hypertension [I27.20]  SOB (shortness of breath) [R06.02]     Background:  Marital Status: Divorced from first husband, second husband deceased  Family: Has one daughter and a niece who she has raised and thinks of as a daughter  Home/Living situation: Lived in [place] until recently, moved up to [place] to be around family in March. 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 Enjoys socializing, being around people. Was also in the [employer] for 12 years.    She reports that she knows that she has a serious lung condition.  She states that she had close follow-up in [place] with advanced heart failure, advanced lung disease and palliative care.  Since being in [place] she has established with [facility] and [facility] who she reports advised her to present to [facility] for heart and lung transplant evaluation.  The patient states that "I know I do not want a transplant, I feel like I am too old and think those organs would be better served going to someone else" but is open to being evaluated.  She reports that she had a right heart catheterization and was found to have reduced heart function so she was sent into the hospital for further evaluation.  Since being in the hospital she is requested to meet with palliative care to continue her goals of care conversations.    She describes herself as a "optimist but also a realist" and strongly prioritizes planning.  She has already planned her funeral expenses with a funeral home in [place] and is in the process of transferring everything to a funeral home in [place].  She reports that she has completed advance care directives, 5 wishes, medical power of attorney but feels that some of these documents need to be updated.  She also feels strongly that she would like to have these conversations with her daughters but that they often do not want to, replying "do not worry about it you are going to be fine" so she would like assistance in communicating her wishes to her family.    She feels that she would not want cardiac resuscitation in the event of cardiac arrest if it is thought that she would suffer irreversible brain damage but is willing to undergo resuscitation if "they are able to do it quickly enough" that complications can be minimized.  Further she would like to avoid being on life support indefinitely but is okay with a short trial of intubation (5 days) if such  intervention should be recommended and it is thought that she has the possibility of benefit from this and the ability to come off the machine.         Goals of Care   CURRENT CPR Status: Full Code       Advanced Care Planning:      Decisional Capacity: yes      Advance Directives have been completed in the past: yes      Advance Directives are available in chart: no , requested      Discussed this admission:  yes      ACP note completed: no       Date:      GOC Discussion: As above      Outcome of Discussion: treatment/curative pathway        Palliative Functional and Symptom Assessment      ADLs prior to admission:    Independent Needs assistance Dependent   Ambulation []  [x]  []    Transferring [x]  []  []    Dressing [x]  []  []    Bathing [x]  []  []    Toileting [x]  []  []    Feeding [x]  []  []      Palliative Performance Scale: 70% - Reduced ambulation, unable to do normal work, some evidence of disease, full self-care, normal or reduced intake, full LOC    FAST Score (Dementia Patients): N/A    ECOG (Cancer Patients): N/A    [place] Symptom Assessment Scale (ESAS): Completed  Anxiety: 0  Depression: 0  Drowsiness: 0  Lack of Appetite: 0  Nausea: 3  Pain: 0  Shortness of Breath: 4  Tiredness: 0  Well-being: 7     Review of Systems     [x]  As per HPI, above ESAS and physical exam, otherwise all systems negative    Pain: none  Pain Location and Description: none          Past Medical, Surgical and Family History   Past Medical History:   Diagnosis Date    A-fib     Cardiomyopathy     CKD (chronic kidney disease)     Congestive heart failure     COPD (chronic obstructive pulmonary disease)     COVID-19     Disorder of thyroid     Gastrointestinal hemorrhage     HTN (hypertension)     NSTEMI (non-ST elevated myocardial infarction)     During LHC    Obesity     OSA on CPAP     PNA (pneumonia)       Past Surgical History:   Procedure Laterality Date    CARDIAC ABLATION      A-FIB ABLATION    CESAREAN SECTION       INSERT / REPLACE / REMOVE PACEMAKER      RIGHT HEART CATH ONLY INCL. CO AND SATS Right 01/24/2020    Procedure: RIGHT HEART CATH w/ exercise;  Surgeon: Burna Forts, MD;  Location: [facility] CARDIAC CATH;  Service: Cardiovascular;  Laterality: Right;  same day discharge      Family History   Problem Relation Age of Onset    Alzheimer's disease Mother     Lung cancer Father        Social History   Substance Use:    reports that she has quit smoking. Her smoking use included cigarettes. She quit after 20.00 years of use. She has never used smokeless tobacco.    reports no history of alcohol use.    reports no history of drug use.     Cultural Concerns:    Translator needed: [x]  NO   []  YES                                      Spirituality and Importance: Jehovah's Witness      Medications   Scheduled Meds  Current Facility-Administered Medications   Medication Dose Route Frequency    allopurinol  50 mg Oral Daily    apixaban  5 mg Oral  Q12H SCH    atorvastatin  80 mg Oral QHS    azaTHIOprine  50 mg Oral Daily    colchicine  0.6 mg Oral Daily    ezetimibe  10 mg Oral QHS    ferrous sulfate  324 mg Oral Q48H    [START ON 02/03/2020] furosemide  20 mg Intravenous BID    levothyroxine  50 mcg Oral Daily at 0600    selexipag  1,600 mcg Oral Q12H SCH    spironolactone  25 mg Oral BID    vitamin D  50 mcg Oral Daily      DRIPS     PRN MEDS  Current Facility-Administered Medications   Medication Dose    acetaminophen  650 mg    Or    acetaminophen  650 mg    dextrose  15 g of glucose    And    dextrose  12.5 g    And    glucagon (rDNA)  1 mg    melatonin  3 mg    naloxone  0.2 mg       Allergies   Allergies   Allergen Reactions    Amiodarone Other (See Comments)     Lung toxicity    Cellcept [Mycophenolate]      Gastro hemorrhages     Zyban [Bupropion] Itching       Physical Exam   BP 95/60    Pulse 71    Temp 97.6 F (36.4 C) (Oral)    Resp 18    Ht 1.575 m (5' 2.01")    Wt 82.6 kg (182 lb 1.6  oz)    SpO2 91%    BMI 33.30 kg/m    Physical Exam:  General: well developed woman sitting up in bed in NAD   HEENT:  EOMI, anicteric, OP/OC clear without lesions or thrush, MMM  Neck: supple   CV: irred rate, no murmur  Lungs:unlabored, CTAB, minimal bibasilar crackles   Abd: soft, NT, ND, NABS, no rebound or guarding  Ext: trace edema of BLE  Neuro: awake, alert, oriented x 3, no focal deficits  Psych:  appropriate insight and judgement, mood and affect congruent with current condition  Skin: no rashes or lesions noted      Labs / Radiology   Lab and diagnostics: reviewed in Epic  Recent Labs   Lab 02/02/20  0042   WBC 6.42   Hgb 12.2   Hematocrit 37.5   Platelets 280       Recent Labs   Lab 02/01/20  1943   PT 14.0*   PT INR 1.2*        Recent Labs   Lab 02/02/20  0042   Sodium 141   Potassium 3.9   Chloride 114*   CO2 16*   BUN 21.0*   Creatinine 1.0   EGFR >60.0   Glucose 96   Calcium 9.0     Recent Labs   Lab 02/02/20  0042 02/01/20  1809 02/01/20  1809   Bilirubin, Total 0.5  More results in Results Review 0.6   Bilirubin Direct  --   --  0.2   Protein, Total 6.5  More results in Results Review 6.9   Albumin 3.6  More results in Results Review 4.0   ALT 9  More results in Results Review 9   AST (SGOT) 14  More results in Results Review 15   More results in Results Review = values in this interval not displayed.  XR Chest 2 Views    Result Date: 02/02/2020  1. Mild basilar atelectasis. 2. Interval increased prominence of cardiac silhouette, technical or consider the possibility of pericardial effusion. Correlate with echocardiogram. 3. Discussed with RN [place] at 8: 32 AM on 02/02/2020. Clide Cliff, MD  02/02/2020 8:34 AM    CT Chest High Resolution    Result Date: 02/02/2020   CT of the chest with high resolution imaging shows no evidence of significant pulmonary interstitial lung disease or pulmonary fibrosis; pulmonary groundglass opacity, seen on prior expiratory imaging, is no longer  demonstrated. Cardiomegaly is unchanged. There is no definite pneumonia or CHF. The examination shows mildly enlarged mediastinal lymph nodes which are grossly unchanged from prior study. Coronary artery calcifications are noted. Other findings as noted above. Miguel Dibble, MD  02/02/2020 4:45 AM

## 2020-02-02 NOTE — Progress Notes (Signed)
Advanced Lung Disease Progress Note    24hr events: NAEO. Stable on 4L/min at rest. Hypotensive this AM but patients reports this is her baseline. No new complaints.     Assessment/Plan   74 y.o. 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  Very low index on recent RHC.  ECHO bedside read with relatively normal RV but decreased hypokinetic LC.  Cardiac MRI at [facility] with similar findings.  LHC at [facility] with non significant coronary disease.    PH Group: 2  WHO Functional class: 3-4  Reveal: 11  ESC/ERS Risk score: intermediate to high    Recommendations  PH  - HRCT without ILD or pulmonary fibrosis, with cardiomegaly and enlarged mediastinal lymph nodes  - 3D echocardiogram with biventricular dysfunction, recommend Adv Heart Failure consult   - ?A trial of milrinone  - Recommend EP consult for adjustment of pacemaker, patient would benefit from increased rate to increase CO  - Discontinue opsumit 10 mg daily as it contributes to fluid retention and ERAs increased mortality in patients with HFrEF-PH  - Continue Selexipag 1600 mg bid for now  - Will obtain dual energy CTA to evaluate for CTEPH  - Recommend increasing IV Lasix to 20mg  bid, and adding Aldactone 25mg  bid   - Daily weights, I/O   - BNP 92   - Hold for SBP <80      - Continue Imuran 50 mg daily   - ?Unclear if really has CTD    - Rheum consulted, note pending   - F/u ana, aldo, rnp, ssa, ssb, myositis panel, ds dna, scl-70, rf, ccp, myositis panel  - CK wnl, tsh wnl, hiv neg    - Patient requesting palliative consult, which is appropriate, medicine team will consult    D/w medicine team, adv heart failure, bedside RN, patient    History of Present Illness  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  Patient had her initial care at [facility].     Summary of issues:   PH group 2: Diagnosed with PAH via RHC 05/2013, transferred to [name] on Uptravi  and Opsumit. Previously was on Adempas and Sildenafil but did not tolerate.    Diastolic/systolic CHF: She also has diastolic heart failure with  Multiple echos showing severely dilated LA apical hypertrophic cardiomyopathy and preserved EF 65-70% as recently as 09/2019. 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 Started Imuran 04/2018, up to 100mg  Qhs.  She also has gout which is her major MSK complaints, is on allopurinol and colchicine.  HRCT at [facility] with no ILD.    OSA on CPAP: Diagnosed 2014 but PSG, does not believe she's seen a sleep doctor since that time, has not received a new machine since then. She says her pressure st 14cmH2O. She wears 6LNC while also wearing the CPAP mask, does not run the O2 through the CPAP itself. Has not had her compliance monitored.    Paroxysmal Afib s/p ablation and GIB: Previously was on eliquis though has had two episodes of GIB, most recently in 11/2019. She was off Eliquis prior to the 8/21 event. She is Jehovah's witness so received IV Iron, was admitted at [facility]. Lowest Hgb was 9.1g/dL, increased to 16.1W/RU at time of discharge. She says  she has been hypotensive since this admission. Her baseline Hgb prior to GIB was 14g/dL. She was not scoped during this admission.    COVID-19 pneumonia  - 06/2019 with week long hospitalization    1.4.1 Connective tissue diseases?  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  Testing Date Comments   Influenza     Pneumovax     Prevnar 13     Covid      Advanced Directive     Palliative Care  N/A       Past Medical History:   Diagnosis Date    A-fib     Cardiomyopathy     CKD (chronic kidney disease)     Congestive heart failure     COPD (chronic obstructive pulmonary disease)     COVID-19     Disorder of thyroid     Gastrointestinal hemorrhage     HTN (hypertension)     NSTEMI (non-ST elevated myocardial infarction)     During LHC    Obesity     OSA on CPAP     PNA (pneumonia)        Past Surgical History:   Procedure Laterality Date    CARDIAC ABLATION      A-FIB ABLATION    CESAREAN SECTION      INSERT / REPLACE / REMOVE PACEMAKER      RIGHT HEART CATH ONLY INCL. CO AND SATS Right 01/24/2020    Procedure: RIGHT HEART CATH w/ exercise;  Surgeon: Diane Forts, MD;  Location: [facility] CARDIAC CATH;  Service: Cardiovascular;  Laterality: Right;  same day discharge       Summary of Pulmonary Vascular Disease Risk Factors  History of DVT or PE: No  History of connective tissue disease: yes  History of heart failure:  yes  History of pulmonary edema: No  History of diastolic dysfunction: yes  History of valvular disease: No  History of blood dyscrasia: No  History of COPD: yes  History of interstitial lung disease: No  History of sleep disordered breathing: yes  History of prescription diet drug use: No  History of over the counter diet drug use: No  History of amphetamine or amphetamine derivative use: No  History of illicit drug use or other toxin exposure: No  History of HIV: No  History of miscarriage: No  History of blood transfusions: No    Current Medications  No current facility-administered medications on file prior to encounter.     Current Outpatient Medications on File Prior to Encounter   Medication Sig Dispense Refill    allopurinol (ZYLOPRIM) 100 MG tablet Take 50 mg by mouth  daily Per pt taking 50mg  (half tab)         apixaban (ELIQUIS) 5 MG Take 1 tablet (5 mg total) by mouth every 12 (twelve) hours  60 tablet 11    atorvastatin (LIPITOR) 80 MG tablet Take 80 mg by mouth daily      azaTHIOprine (IMURAN) 50 MG tablet Take 50 mg by mouth daily         Colchicine 0.6 MG capsule Take 0.6 mg by mouth daily      ezetimibe (ZETIA) 10 MG tablet Take 10 mg by mouth daily      furosemide (LASIX) 40 MG tablet Take 40 mg by mouth daily Per pt she takes 40mg  and 20 mg alternating daily , took 20 mg on 01/16/2020        levothyroxine (SYNTHROID) 100 MCG tablet Take 50 mcg by mouth Once a day at 6:00am         macitentan (OPSUMIT) 10 MG tablet Take 10 mg by mouth daily      OXYGEN-HELIUM IN Inhale into the lungs      potassium chloride (K-DUR) 10 MEQ tablet Take 10 mEq by mouth      Selexipag (Uptravi) 1600 MCG Tab Take 1,600 mg by mouth 2 (two) times daily      vitamin D (CHOLECALCIFEROL) 25 MCG (1000 UT) tablet Take 2,000 Units by mouth daily      ferrous sulfate 325 (65 FE) MG tablet Take 325 mg by mouth Once every Monday, Wednesday and Friday morning Taking on Monday wed, friday            PH Medications Start date Current dose Stop Date Reason stopped   uptravi   1600 mg bid     opsumit  10 mg 02/02/20 Reduced EF                   Allergies   Allergen Reactions    Amiodarone Other (See Comments)     Lung toxicity    Cellcept [Mycophenolate]      Gastro hemorrhages     Zyban [Bupropion] Itching     Exam  BP 100/67    Pulse 71    Temp 97.6 F (36.4 C) (Oral)    Resp 18    Ht 1.575 m (5' 2.01")    Wt 82.6 kg (182 lb 1.6 oz)    SpO2 98%    BMI 33.30 kg/m   Estimated body mass index is 33.3 kg/m as calculated from the following:    Height as of this encounter: 1.575 m (5' 2.01").    Weight as of this encounter: 82.6 kg (182 lb 1.6 oz).    Weight Monitoring 02/01/2020 02/01/2020 02/02/2020   Height 157.5 cm 157.5 cm -   Height Method - - -   Weight 82.283 kg 82.283 kg 82.6 kg    Weight Method - - Standing Scale   BMI (calculated) 33.2 kg/m2 33.2 kg/m2 -     General: awake, alert, oriented x 3; no acute distress; 4 L NC  HEENT: pupils equal with EOMI; no thyromegaly, no cervical LN, no thrush  Cardiovascular: regular rate and rhythm, no murmurs  Lungs: clear to auscultation bilaterally, without wheezing, rhonchi, or rales  Abdomen: soft, non-tender, non-distended  Extremities: no clubbing, cyanosis, +trace LE edema  Neuro: normal sensory and motor systems and able to ambulate  Derm: no rashes  Musculoskeletal: nl ROM, no muscle weakness    Cumulative Data  Results     Procedure Component Value Units Date/Time    ANA IFA w reflex to Titer/Pattern/Antibody [[id number]]  (Abnormal) Collected: 02/01/20 1943     Updated: 02/02/20 1416  ANA Screen Positive     ANA Titer 1:640     ANA Pattern Speckled    Troponin I [[id number]] Collected: 02/02/20 0042    Specimen: Blood Updated: 02/02/20 0128     Troponin I 0.05 ng/mL     GFR [[id number]] Collected: 02/02/20 0042     Updated: 02/02/20 0114     EGFR >60.0    Comprehensive metabolic panel [[id number]]  (Abnormal) Collected: 02/02/20 0042    Specimen: Blood Updated: 02/02/20 0114     Glucose 96 mg/dL      BUN 08.6 mg/dL      Creatinine 1.0 mg/dL      Sodium 578 mEq/L      Potassium 3.9 mEq/L      Chloride 114 mEq/L      CO2 16 mEq/L      Calcium 9.0 mg/dL      Protein, Total 6.5 g/dL      Albumin 3.6 g/dL      AST (SGOT) 14 U/L      ALT 9 U/L      Alkaline Phosphatase 107 U/L      Bilirubin, Total 0.5 mg/dL      Globulin 2.9 g/dL      Albumin/Globulin Ratio 1.2     Anion Gap 11.0    Magnesium [[id number]] Collected: 02/02/20 0042    Specimen: Blood Updated: 02/02/20 0114     Magnesium 1.7 mg/dL     CBC without differential [[id number]]  (Abnormal) Collected: 02/02/20 0042    Specimen: Blood Updated: 02/02/20 0111     WBC 6.42 x10 3/uL      Hgb 12.2 g/dL      Hematocrit 27.2 %      Platelets 280 x10 3/uL      RBC 3.81 x10 6/uL      MCV 98.4 fL      MCH  32.0 pg      MCHC 32.5 g/dL      RDW 24 %      MPV 9.6 fL      Nucleated RBC 0.8 /100 WBC      Absolute NRBC 0.05 x10 3/uL     HIV-1/2 Ag/Ab 4th Gen. w/ Reflex [[id number]] Collected: 02/01/20 1943    Specimen: Blood Updated: 02/02/20 0027     HIV Ag/Ab, 4th Generation Non-Reactive    Rheumatoid factor [[id number]] Collected: 02/01/20 1943    Specimen: Blood Updated: 02/01/20 2358     Rheumatoid Factor <13.0    Ribonucleoprotein(RNP)Antibody [[id number]] Collected: 02/01/20 1943     Updated: 02/01/20 2320    Scleroderma (SCL-70) Antibody [[id number]] Collected: 02/01/20 1943     Updated: 02/01/20 2320    Double Stranded DNA (dsDNA) Antibody [[id number]] Collected: 02/01/20 1943    Specimen: Blood Updated: 02/01/20 2320    Troponin I [[id number]] Collected: 02/01/20 2134    Specimen: Blood Updated: 02/01/20 2205     Troponin I 0.05 ng/mL     TSH [[id number]] Collected: 02/01/20 1943    Specimen: Blood Updated: 02/01/20 2058     TSH 2.25 uIU/mL     T4, free [[id number]] Collected: 02/01/20 1943    Specimen: Blood Updated: 02/01/20 2058     T4 Free 0.87 ng/dL     CBC and differential [[id number]]  (Abnormal) Collected: 02/01/20 1943    Specimen: Blood Updated: 02/01/20 2056     WBC 5.55 x10 3/uL      Hgb 11.9 g/dL      Hematocrit 62.6 %  Platelets 307 x10 3/uL      RBC 3.75 x10 6/uL      MCV 97.3 fL      MCH 31.7 pg      MCHC 32.6 g/dL      RDW 24 %      MPV 10.1 fL      Neutrophils 69.8 %      Lymphocytes Automated 18.0 %      Monocytes 9.7 %      Eosinophils Automated 1.1 %      Basophils Automated 0.7 %      Immature Granulocytes 0.7 %      Nucleated RBC 0.9 /100 WBC      Neutrophils Absolute 3.87 x10 3/uL      Lymphocytes Absolute Automated 1.00 x10 3/uL      Monocytes Absolute Automated 0.54 x10 3/uL      Eosinophils Absolute Automated 0.06 x10 3/uL      Basophils Absolute Automated 0.04 x10 3/uL      Immature Granulocytes Absolute 0.04 x10 3/uL      Absolute NRBC 0.05 x10 3/uL     Cell MorpHology [[id number]]   (Abnormal) Collected: 02/01/20 1943     Updated: 02/01/20 2056     Cell Morphology Abnormal     Platelet Estimate Normal     Ovalocytes Present     Spherocytes 1+     Dual RBC Population Present     Giant Platelets Present    B-type Natriuretic Peptide [[id number]] Collected: 02/01/20 1943    Specimen: Blood Updated: 02/01/20 2038     B-Natriuretic Peptide 92 pg/mL     Creatine Kinase (CK) [[id number]] Collected: 02/01/20 1943    Specimen: Blood Updated: 02/01/20 2035     Creatine Kinase (CK) 84 U/L     Prothrombin time/INR [[id number]]  (Abnormal) Collected: 02/01/20 1943    Specimen: Blood Updated: 02/01/20 2030     PT 14.0 sec      PT INR 1.2    Cyclic citrul peptide antibody, IgG [[id number]] Collected: 02/01/20 1943    Specimen: Blood Updated: 02/01/20 2012    SM and SM/RNP Antibodies [[id number]] Collected: 02/01/20 1943     Updated: 02/01/20 2012    Aldolase [[id number]] Collected: 02/01/20 1943    Specimen: Blood Updated: 02/01/20 2011    Narrative:      Centrifuge and separate within 1 hour.  SST Not acceptable.    Basic Metabolic Panel [[id number]]  (Abnormal) Collected: 02/01/20 1809    Specimen: Blood Updated: 02/01/20 1851     Glucose 92 mg/dL      BUN 63.0 mg/dL      Creatinine 1.2 mg/dL      Calcium 9.3 mg/dL      Sodium 160 mEq/L      Potassium 4.1 mEq/L      Chloride 111 mEq/L      CO2 19 mEq/L      Anion Gap 10.0    Hepatic function panel (LFT) [[id number]]  (Abnormal) Collected: 02/01/20 1809    Specimen: Blood Updated: 02/01/20 1851     Bilirubin, Total 0.6 mg/dL      Bilirubin Direct 0.2 mg/dL      Bilirubin Indirect 0.4 mg/dL      AST (SGOT) 15 U/L      ALT 9 U/L      Alkaline Phosphatase 114 U/L      Protein, Total 6.9 g/dL      Albumin 4.0 g/dL      Globulin  2.9 g/dL      Albumin/Globulin Ratio 1.4    GFR [[id number]] Collected: 02/01/20 1809     Updated: 02/01/20 1851     EGFR 53.0          ILD panel  Marker Historic    ANA Pattern     ANA titer 1:640    ANA screen (IFA)     Anti-DNA (DS)  Ab     SSA  (Ro)Ab     SSB (La) Ab     RF 14 (<14)    Anti-CCP elevated    JO-1 Ab     SCL-70 Ab negative    Anti-centromere     Sm/RNP Ab 25    C-reactive Protein     BNP  92    Ro52 positive      HRCT 02/02/20  CT of the chest with high resolution imaging shows no evidence of significant pulmonary interstitial lung disease or pulmonary fibrosis; pulmonary groundglass opacity, seen on prior expiratory imaging, is no longer demonstrated. Cardiomegaly is unchanged. There is no definite pneumonia or CHF. The examination shows mildly enlarged mediastinal lymph nodes which are grossly unchanged from prior study. Coronary artery calcifications are noted.     09/16/19 HRCT chest: moderate mosaic attenuation throughout the lungs worsening on expiratory phase imaging. Findings may reflect air trapping, less likely small vessel disease.   Unchanged findings of pulmonary hypertension. Diffuse esophageal thickening, slightly worsened from prior exam.   Small hiatal hernia. Interval near complete resolution of previous multifocal patchy airspace disease.   Slight interval worsening of mediastinal lymphadenopathy. Scattered pulmonary nodules, unchanged from prior exam.     09/20/2019 Cardiac MRI:  LVEF 35%, SV 34mL, LVEDV 98mL, LVESV 64mL  RVEF 40%, SV 48mL, RVEDV , EVESV 64mL  Impression:  1. 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 Would consider coronary disease with previous apical infarction as another possible etiology.  2. Moderately reduced left ventricular function with apical akinesis  3. The right ventricle is normal in size with mildly reduced function  4. The left atrium is moderately dilated  5. The main pulmonary artery is mildly dilated  6. Transmural enhancement of the left ventricular apex. May represent scar formation in the setting of apical hypertrophic cardiomyopathy verses prior apical  infarct.    11/24/2019 LLE Duplex: negative for DVT    V/Q scan negative 3/22 June 2017 high-resolution CT scan of the chest images independently reviewed showing patchy groundglass throughout with air trapping but no clear evidence of underlying interstitial lung disease. Some evidence of emphysema seen, motion degraded images    October 2019 high-resolution CT scan of the chest: Prominently dilated main pulmonary artery, some patchy groundglass attenuation throughout both lungs, somewhat centrilobular, scattered subpleural reticulation differential diagnosis includes NSIP versus UIP. Some centrilobular emphysema noted, air trapping noted. Images independently reviewed, there is definitely moderate centrilobular emphysema and an upper lobe predominant fashion as well as centrilobular groundglass      Cath  Date 01/24/20 09/13/19 06/2017 06/2016   Meds uptravi Opsumit, Uptravi     RA (mmHg) 10 6 3 4    RV (mmHg) 79/5 56/7     PA systolic (mmHg) 83 56 49 69   PA diastolic (mmHg) 38 23 16 23    PA mean (mmHg) 53  29 38   PCWP (mmHg) 14 13 10 8    LVEDP (mmHg)       CO (  Fick) 3.1 3.49  4.15   CI (Fick) 1.7 1.89     CO (TD) 1.9 4.26  3.14   CI (TD) 1.0 2.3     PVR 12.5 fick      MVO2 49          Echo  Date 06/23/19 6/19 3/19 3/18   RA       RV Normal size. RVSF moderately reduced  Normal size and systolic fxn    RVSP 50-60      TR jet velocity       LA       LV 50-55% EF normal EF 65-70% EF 65-70%   Other  bubble study negative Apical HCM  Severe LAE Apical HCM. Severe LAE, PASP        Date  12/30/19 04/2018 01/2017 07/2016   Oxygen 6L unknown RA RA   Rest sat 100; 85% ra      SpO2 nadir 96      SpO2 end 96      Distance 137 m - stopped early 188 229 109   Rest pulse 71      Max pulse 90      Pulse rate recovery 8      Max Borg 5          PFTs  Date:  12/30/19 01/01/18 3/18   FVC 1.95 (86) 1.88 (89) 1.81 (87)   FEV1 1.38 (79) 1/33 (83)    FEV1/FVC  78 83    TLC  4.02 (84)    RV      RV/TLC      DLco  4.75 (22) 7.5 (32)     Reveal Risk Score    Reveal 2.0    score   WHO Group 1  Subroup CTD-PAH  +1 PoPH  +3 Heritable  +2 1          demographics  Female Age > 60  +2  0          comorbidoties  eGFR<60cc/min/1.41m2  +1  0          FC I  -1 III  +1 IV  +2 1          VS SBP<144mmHg  +1  HR>96bpm  +1 0          All Cause hospitalization  within 6 months  +1  1          ?43m  -2 320 to <442m  -1 <148m  +1 1            BNP <50 or  NTproBNP<300  -2 200 to <800    +1 ?800 or  NT-proBNP?1100  +2 0          ECHO  Effusion  +1  ?          PFTS  DLCO<40%  +1  1          RHC  RAP >64mmHg  +1 w/n 1 year PVR<5WU  -2 0             Sum of above 5       +6      Risk score 11      Score Risk 12 month mortality   4-5    6 Very low risk    Low risk   <2.6%   7-8 Intermediate risk 6.2-7%   ?9 High risk >10.7%       Campbell Soup, PA-C  Inpatient Physician Assistant  [facility]  Spectra: [phone]    ATTENDING PHYSICIAN ATTESTATION:  As the attending physician, I certify that I have seen and examined the patient and I have reviewed the notes, assessments, and/or procedures performed by Sharion Balloon, PA-C.  I personally reviewed all the lab results and radiological studies.  I agree with the plan as outlines above. I concur with her documentation of [name].    Jac Canavan, MD  Medical Director, Pulmonary Hypertension Program  Spectra [phone]  02/02/2020 2:25 PM        **If after hours (5pm) please page Medicine hospitalist listed under the Treatment Team**

## 2020-02-02 NOTE — Consults (Signed)
[facility] Consult Note    Date Time: 02/02/20 2:28 PM  Patient Name: [name]  Requesting Physician: Carloyn Manner, MD      Reason for Consultation:   Pacemaker programming assessment, hx of AF    Assessment/Plan:     1. Hx of recurrent AF and atypical atrial flutter (s/p AF ablation in past), with RVR, and subsequent AV node ablation and single chamber MDT PM (LBB lead) 01/2019.  2. 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  3. PAH  4. RV dysfxn  5. 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  We did make that change, lower rate increased from 70 to 80 bpm.    As below, history of atrial fibrillation ablation, multiple recurrences of fibrillation and flutter, failed antiarrhythmic therapy, and subsequently underwent AV node ablation and iplantation of a single-chamber pacemaker with a left bundle branch area lead in 01/2019.    She is currently in sinus rhythm.  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    It is unclear how much sinus rhythm she has - therefore it would be difficult to recommend addition of an atrial lead to allow for AV synchrony if she has a large burden of atrial fibrillation which has been her reported history.    Depending upon other management options, an EP consideration would be placement of a 30-day monitor to assess for recurrence of atrial fibrillation or atrial flutter.    If she does not fact have a good deal of sinus rhythm, that might justify upgrade to a dual-chamber pacing device with addition of an atrial lead to restore AV synchrony.    Agree with [facility] given hx of AF/AFL in setting of HCM and other risk factors.     We will assess outside records further, discuss further with the EP  team, and provide further recommendations.    Diane Blossom, PA-C  [facility]  Spectra link [phone]  Office [phone]      History:   [name] is a 74 y.o. 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    Patient has established care with Dr. Brooke Dare in the advanced lung clinic. She had a RHC 01/24/20 which showed severe pulmonary HTN and severe reduced CO/CI (Fick CO/CI 1.9/1.0).     Records reviewed on CareEverywhere - most recent cardiology note [name]).    Long hx of PAF, prior AF ablation.  Intolerant to amiodarone (pulmonary concern), hx of prolonged QT therefore avoided Tikosyn and sotalol, breakthrough AF/AFL on Multaq.  Hx of multiple cardioversions.  Recurrences of AF and typical flutter with RVR.  Underwent AV node ablation and implantation of single chamber MDT pacemaker (appears to be LBB lead) 01/2019    TTE here 10/28 with EF 50%, with apical hypertrophy.  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  She moderate to severe RV dyxfxn.  Severe LAE.  Cardiodiagnostic Studies:   EKG 02/02/20:  Appears to be sinus with VVI pacing at 72 bpm      Telemetry: sinus, V pacing 70's (asynchronous)     Pacemaker interrogation today:  MDT single chamber pacemaker  Implanted 01/31/19  Battery 11.1 years  VVIR 70  Underlying rhythm sinus with complete heart block, pacemaker dependent  V lead impedance and pacing threshold normal   (12lead consistent with LBB conduction system capture)  V paced 99.9%  HR histogram appears normal with distribution into 100 bpm range    TTE 02/02/20:    * The left ventricle is normal in size.    * Left ventricular wall thickness is  severely increased, particularly  towards the mid cavity and apex consistent with mid/apical hypertrophic cardiomyopathy. 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    * Left ventricular systolic function is mildly decreased with an ejection fraction by Biplane Method of Discs of  50 %.    * Left ventricular diastolic filling parameters are consistent with Grade II  diastolic dysfunction (pseudonormal pattern) with elevated left atrial  pressure.    * There is diastolic mital regurgitation consistent with AV dissociation.    * The right ventricular cavity size is normal in size.    * Moderately-to-severely decreased right ventricular systolic function  (TAPSE 1.0 cm, 3D RVEF 28% and FAC 24%).    * The left atrium is severely dilated.    * The right atrium is dilated.    * There is moderate tricuspid regurgitation.    * Severe pulmonary hypertension with estimated right ventricular systolic  pressure of  87 mmHg.    * No prior study is available for comparison.    09/20/2019 Cardiac MRI:  LVEF 35%, SV 34mL, LVEDV 98mL, LVESV 64mL  RVEF 40%, SV 48mL, RVEDV , EVESV 64mL  Impression:  1. 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 Would consider coronary disease with previous apical infarction as another possible etiology.  2. Moderately reduced left ventricular function with apical akinesis  3. The right ventricle is normal in size with mildly reduced function  4. The left atrium is moderately dilated  5. The main pulmonary artery is mildly dilated  6. Transmural enhancement of the left ventricular apex. May represent scar formation in the setting of apical hypertrophic cardiomyopathy verses prior apical infarct.    LHC at [facility] with non significant coronary disease.      Past Medical History:     Past Medical History:   Diagnosis Date    A-fib     Cardiomyopathy     CKD (chronic kidney disease)     Congestive heart failure     COPD  (chronic obstructive pulmonary disease)     COVID-19     Disorder of thyroid     Gastrointestinal hemorrhage     HTN (hypertension)     NSTEMI (non-ST elevated myocardial infarction)     During LHC    Obesity     OSA on CPAP     PNA (pneumonia)        Past Surgical History:     Past Surgical History:   Procedure Laterality Date    CARDIAC ABLATION      A-FIB ABLATION    CESAREAN SECTION      INSERT / REPLACE / REMOVE PACEMAKER      RIGHT HEART CATH ONLY INCL. CO AND SATS Right 01/24/2020  Procedure: RIGHT HEART CATH w/ exercise;  Surgeon: Burna Forts, MD;  Location: [facility];  Service: Cardiovascular;  Laterality: Right;  same day discharge       Family History:     Family History   Problem Relation Age of Onset    Alzheimer's disease Mother     Lung cancer Father    .  Family history reviewed and is otherwise not pertinent.     Social History:     Social History     Socioeconomic History    Marital status: Single     Spouse name: Not on file    Number of children: Not on file    Years of education: Not on file    Highest education level: Not on file   Occupational History    Not on file   Tobacco Use    Smoking status: Former Smoker     Years: 20.00     Types: Cigarettes    Smokeless tobacco: Never Used    Tobacco comment: stopped 20 years ago   Vaping Use    Vaping Use: Never used   Substance and Sexual Activity    Alcohol use: Never    Drug use: Never    Sexual activity: Not on file   Other Topics Concern    Not on file   Social History Narrative    Not on file     Social Determinants of Best     Financial Resource Strain:     Difficulty of Paying Living Expenses: Not on file   Food Insecurity:     Worried About Programme researcher, broadcasting/film/video in the Last Year: Not on file    [employer] of Food in the Last Year: Not on file   Transportation Needs:     Lack of Transportation (Medical): Not on file    Lack of Transportation (Non-Medical): Not on file   Physical Activity:      Days of Exercise per Week: Not on file    Minutes of Exercise per Session: Not on file   Stress:     Feeling of Stress : Not on file   Social Connections:     Frequency of Communication with Friends and Family: Not on file    Frequency of Social Gatherings with Friends and Family: Not on file    Attends Religious Services: Not on file    Active Member of Clubs or Organizations: Not on file    Attends Banker Meetings: Not on file    Marital Status: Not on file   Intimate Partner Violence:     Fear of Current or Ex-Partner: Not on file    Emotionally Abused: Not on file    Physically Abused: Not on file    Sexually Abused: Not on file   Housing Stability:     Unable to Pay for Housing in the Last Year: Not on file    Number of Places Lived in the Last Year: Not on file    Unstable Housing in the Last Year: Not on file       Allergies:     Allergies   Allergen Reactions    Amiodarone Other (See Comments)     Lung toxicity    Cellcept [Mycophenolate]      Gastro hemorrhages     Zyban [Bupropion] Itching       Medications:     Current Facility-Administered Medications   Medication Dose Route Frequency  allopurinol  50 mg Oral Daily    apixaban  5 mg Oral Q12H SCH    atorvastatin  80 mg Oral QHS    azaTHIOprine  50 mg Oral Daily    colchicine  0.6 mg Oral Daily    ezetimibe  10 mg Oral QHS    ferrous sulfate  324 mg Oral Q48H    [START ON 02/03/2020] furosemide  20 mg Intravenous BID    levothyroxine  50 mcg Oral Daily at 0600    selexipag  1,600 mcg Oral Q12H [facility]    spironolactone  25 mg Oral BID    vitamin D  50 mcg Oral Daily       Review of Systems:   REVIEW OF SYSTEMS: All other systems reviewed and negative except as above.      Physical Exam:   BP 95/60    Pulse 71    Temp 97.6 F (36.4 C) (Oral)    Resp 18    Ht 1.575 m (5' 2.01")    Wt 82.6 kg (182 lb 1.6 oz)    SpO2 91%    BMI 33.30 kg/m     General Appearance:  Alert, cooperative, no distress, appears stated age    Head:  Normocephalic, without obvious abnormality, atraumatic   Neck: Supple, symmetrical, trachea midline, no adenopathy, thyroid: not enlarged, symmetric, no tenderness/mass/nodules, no carotid bruit or JVD   Back:   Symmetric, no curvature, ROM normal, no CVA tenderness   Lungs:   Clear to auscultation bilaterally, respirations unlabored   Chest Wall:  No tenderness or deformity   Heart:  Regular rate and rhythm, S1, S2 normal, no murmur, rub or gallop   Abdomen:   Soft, non-tender, bowel sounds active all four quadrants,  no masses, no organomegaly   Extremities: Extremities normal, atraumatic, no cyanosis or edema   Pulses: 2+ and symmetric   Skin: Skin color, texture, turgor normal, no rashes or lesions   Neurologic: Normal         Labs Reviewed:     Results     Procedure Component Value Units Date/Time    ANA IFA w reflex to Titer/Pattern/Antibody [[id number]]  (Abnormal) Collected: 02/01/20 1943     Updated: 02/02/20 1416     ANA Screen Positive     ANA Titer 1:640     ANA Pattern Speckled    Troponin I [[id number]] Collected: 02/02/20 0042    Specimen: Blood Updated: 02/02/20 0128     Troponin I 0.05 ng/mL     GFR [[id number]] Collected: 02/02/20 0042     Updated: 02/02/20 0114     EGFR >60.0    Comprehensive metabolic panel [[id number]]  (Abnormal) Collected: 02/02/20 0042    Specimen: Blood Updated: 02/02/20 0114     Glucose 96 mg/dL      BUN 62.9 mg/dL      Creatinine 1.0 mg/dL      Sodium 528 mEq/L      Potassium 3.9 mEq/L      Chloride 114 mEq/L      CO2 16 mEq/L      Calcium 9.0 mg/dL      Protein, Total 6.5 g/dL      Albumin 3.6 g/dL      AST (SGOT) 14 U/L      ALT 9 U/L      Alkaline Phosphatase 107 U/L      Bilirubin, Total 0.5 mg/dL      Globulin 2.9 g/dL  Albumin/Globulin Ratio 1.2     Anion Gap 11.0    Magnesium [[id number]] Collected: 02/02/20 0042    Specimen: Blood Updated: 02/02/20 0114     Magnesium 1.7 mg/dL     CBC without differential [[id number]]  (Abnormal) Collected: 02/02/20 0042     Specimen: Blood Updated: 02/02/20 0111     WBC 6.42 x10 3/uL      Hgb 12.2 g/dL      Hematocrit 78.2 %      Platelets 280 x10 3/uL      RBC 3.81 x10 6/uL      MCV 98.4 fL      MCH 32.0 pg      MCHC 32.5 g/dL      RDW 24 %      MPV 9.6 fL      Nucleated RBC 0.8 /100 WBC      Absolute NRBC 0.05 x10 3/uL     HIV-1/2 Ag/Ab 4th Gen. w/ Reflex [[id number]] Collected: 02/01/20 1943    Specimen: Blood Updated: 02/02/20 0027     HIV Ag/Ab, 4th Generation Non-Reactive    Rheumatoid factor [[id number]] Collected: 02/01/20 1943    Specimen: Blood Updated: 02/01/20 2358     Rheumatoid Factor <13.0    Ribonucleoprotein(RNP)Antibody [[id number]] Collected: 02/01/20 1943     Updated: 02/01/20 2320    Scleroderma (SCL-70) Antibody [[id number]] Collected: 02/01/20 1943     Updated: 02/01/20 2320    Double Stranded DNA (dsDNA) Antibody [[id number]] Collected: 02/01/20 1943    Specimen: Blood Updated: 02/01/20 2320    Troponin I [[id number]] Collected: 02/01/20 2134    Specimen: Blood Updated: 02/01/20 2205     Troponin I 0.05 ng/mL     TSH [[id number]] Collected: 02/01/20 1943    Specimen: Blood Updated: 02/01/20 2058     TSH 2.25 uIU/mL     T4, free [[id number]] Collected: 02/01/20 1943    Specimen: Blood Updated: 02/01/20 2058     T4 Free 0.87 ng/dL     CBC and differential [[id number]]  (Abnormal) Collected: 02/01/20 1943    Specimen: Blood Updated: 02/01/20 2056     WBC 5.55 x10 3/uL      Hgb 11.9 g/dL      Hematocrit 57.3 %      Platelets 307 x10 3/uL      RBC 3.75 x10 6/uL      MCV 97.3 fL      MCH 31.7 pg      MCHC 32.6 g/dL      RDW 24 %      MPV 10.1 fL      Neutrophils 69.8 %      Lymphocytes Automated 18.0 %      Monocytes 9.7 %      Eosinophils Automated 1.1 %      Basophils Automated 0.7 %      Immature Granulocytes 0.7 %      Nucleated RBC 0.9 /100 WBC      Neutrophils Absolute 3.87 x10 3/uL      Lymphocytes Absolute Automated 1.00 x10 3/uL      Monocytes Absolute Automated 0.54 x10 3/uL      Eosinophils Absolute Automated 0.06  x10 3/uL      Basophils Absolute Automated 0.04 x10 3/uL      Immature Granulocytes Absolute 0.04 x10 3/uL      Absolute NRBC 0.05 x10 3/uL     Cell MorpHology [[id number]]  (Abnormal) Collected: 02/01/20 1943     Updated: 02/01/20 2056  Cell Morphology Abnormal     Platelet Estimate Normal     Ovalocytes Present     Spherocytes 1+     Dual RBC Population Present     Giant Platelets Present    B-type Natriuretic Peptide [[id number]] Collected: 02/01/20 1943    Specimen: Blood Updated: 02/01/20 2038     B-Natriuretic Peptide 92 pg/mL     Creatine Kinase (CK) [[id number]] Collected: 02/01/20 1943    Specimen: Blood Updated: 02/01/20 2035     Creatine Kinase (CK) 84 U/L     Prothrombin time/INR [[id number]]  (Abnormal) Collected: 02/01/20 1943    Specimen: Blood Updated: 02/01/20 2030     PT 14.0 sec      PT INR 1.2    Cyclic citrul peptide antibody, IgG [[id number]] Collected: 02/01/20 1943    Specimen: Blood Updated: 02/01/20 2012    SM and SM/RNP Antibodies [[id number]] Collected: 02/01/20 1943     Updated: 02/01/20 2012    Aldolase [[id number]] Collected: 02/01/20 1943    Specimen: Blood Updated: 02/01/20 2011    Narrative:      Centrifuge and separate within 1 hour.  SST Not acceptable.    Basic Metabolic Panel [[id number]]  (Abnormal) Collected: 02/01/20 1809    Specimen: Blood Updated: 02/01/20 1851     Glucose 92 mg/dL      BUN 32.9 mg/dL      Creatinine 1.2 mg/dL      Calcium 9.3 mg/dL      Sodium 518 mEq/L      Potassium 4.1 mEq/L      Chloride 111 mEq/L      CO2 19 mEq/L      Anion Gap 10.0    Hepatic function panel (LFT) [[id number]]  (Abnormal) Collected: 02/01/20 1809    Specimen: Blood Updated: 02/01/20 1851     Bilirubin, Total 0.6 mg/dL      Bilirubin Direct 0.2 mg/dL      Bilirubin Indirect 0.4 mg/dL      AST (SGOT) 15 U/L      ALT 9 U/L      Alkaline Phosphatase 114 U/L      Protein, Total 6.9 g/dL      Albumin 4.0 g/dL      Globulin 2.9 g/dL      Albumin/Globulin Ratio 1.4    GFR [[id number]] Collected:  02/01/20 1809     Updated: 02/01/20 1851     EGFR 53.0

## 2020-02-02 NOTE — Consults (Signed)
Advanced Heart Failure and Transplant Consult Note    Heart Failure/Transplant SpectraLink: [id number]    Primary Cardiologist: [facility]  Referring Physician: Sandria Senter, MD    Listed for transplant: No.  Status:  N/A    Assessment:   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      Cardiomyopathy, mostly non ischemic, with previous hx of apical variant HCM that was noted on multiple ECHOs done from 2016-2020 with LVEF 60-65%, grade III DD, apical hypertrophy noted on all these studies. ECHO from 06/2019 showed LVEF 50-55%, RV systolic function moderately decreased. CMR was done recently showing LVEF down to 35% with possible apical aneurysm seen.    Repeat ECHO on 10/28 showed LVEF 50%, grade II DD, evidence of mid/apical hypertrophic cardiomyopathy, severely decreased RV systolic function with severe PH RVSP 87 mmHg.    Hx of HFpEF, grade III DD, NYHA class III/IV, ACC/AHA stage C. Recent progression of underlying cardiomyopathy. RHC was done on 10/19 showing Baseline: RA 10. RV 79/5. PA 83/38/53. PCW 16/16/14. BP (cuff) 150/71/102. Sats: RA 49%, PA 52%, AO (Pulse-Ox) 100%, 6L NC O2. Fick CO/CI 3.1/1.7, TD CO/CI 1.9/1.0. Exercise (2 minutes bicycle/Stage 1): PA 87/48/63. PCW 23/23/21. Sats: PA 19%, AO (Pulse-Ox) 100%, 6L NC O2. Fick CO/CI 1.9/1.0.   Previous RHC done over the last 2-3 years showed normal R and L filling pressures with preserved CI.    LHC done in 10/2014 showed ostial 75% lesion in Diag 1. Repeat LHC done in 09/2019 reported acc to the chart to have non significant CAD.    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    OSA on CPAP in addition to 6L NC at home.     Recommendations:    Continue lasix to 40 mg IV daily with goal of net even to negative. Wouldn't be aggressive with diuresis especially with hx of HOCM given the possibility of underfilling of LV and resulting hypotension.    The patient wouldn't be eligible for inotropic therapy given noted evidence of hypertrophic cardiomyopathy, mostly apical variant HOCM.    For HCM, BB wouldn't be a good option given Pulm condition. CCB can be helpful if BP can tolerate to improve diastolic filling.    With apical LV aneurysm, continue AC for now. Given the combination of HOCM and Afib, the patient needs to continue on [facility] regardless her CHA2DS2Vasc score.    The patient declined any advanced therapy or heart transplant option and she showed more interest to seek palliative care. She wouldn't be a good candidate for transplant though especially with the age and the other co morbidities.    Daily weight monitoring, with monitoring I/O, LFTs and kidney functions with lactate.     Signed by: Lauro Regulus, MD     I have seen and examined the patient and agree with the assessment and plan as  outlined above.     Royce Macadamia, MD  Heart Failure / MCS / Heart Transplant          Subjective:   CC:  Hypertrophic cardiomyopathy.     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     Patient recently moved here from [place] from [facility] and is establishing care. Patient notes she had an MI in 2013 and had afib at that time. Subsequent work up was notable for [facility]. 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 She had a RHC 3/19 with lower PA pressure, normal R and L heart filling pressure, but CO was low.   Echo bubble study was negative. CCP and Ro-52 positive. 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 She was trialed on prednisone and cellcept which was stopped after a GI bleed. She was then started on azathioprine.     Patient moved here in March and was hospitalized for a week with COVID-19. She notes that since then, has had more fatigue and dyspnea on exertion. She had a cardiac MRI at [facility] that showed decreased EF 35% with possible apical aneurysm.     Patient has established care with Dr. Brooke Dare in the advanced lung clinic. She had a recent RHC 01/24/20 which showed severe pulmonary HTN and severe reduced CO/CI.     ROS:  All other systems were reviewed and are otherwise negative except as noted in the HPI.      Patient Active Problem List   Diagnosis    SOB (shortness of breath)       Past Medical History:  Past Medical History:   Diagnosis Date    A-fib     Cardiomyopathy     CKD (chronic kidney disease)     Congestive heart failure     COPD (chronic obstructive pulmonary disease)     COVID-19     Disorder of thyroid     Gastrointestinal hemorrhage     HTN (hypertension)     NSTEMI (non-ST elevated myocardial infarction)     During LHC    Obesity     OSA on CPAP     PNA (pneumonia)         Past Surgical History:  Past Surgical History:   Procedure Laterality Date    CARDIAC ABLATION      A-FIB ABLATION    CESAREAN SECTION      INSERT / REPLACE / REMOVE PACEMAKER      RIGHT HEART CATH ONLY INCL. CO AND SATS Right 01/24/2020    Procedure: RIGHT HEART CATH w/ exercise;  Surgeon: Burna Forts, MD;  Location: [facility];  Service: Cardiovascular;  Laterality: Right;  same day  discharge          Current Meds:  allopurinol, 50 mg, Oral, Daily  apixaban, 5 mg, Oral, Q12H SCH  atorvastatin, 80 mg, Oral, QHS  azaTHIOprine, 50 mg, Oral, Daily  colchicine, 0.6 mg, Oral, Daily  ezetimibe, 10 mg, Oral, QHS  ferrous sulfate, 324 mg, Oral, Q48H  furosemide, 20 mg, Intravenous, Daily  levothyroxine, 50 mcg, Oral, Daily at  0600  selexipag, 1,600 mcg, Oral, Q12H SCH  vitamin D, 50 mcg, Oral, Daily        Drips:      Home Meds:  Home Medications     Med List Status: Complete Set By: Manion, [place] E, RN at 02/01/2020  4:50 PM                allopurinol (ZYLOPRIM) 100 MG tablet     Take 50 mg by mouth daily Per pt taking 50mg  (half tab)        Notes:  50mg  daily     apixaban (ELIQUIS) 5 MG     Take 1 tablet (5 mg total) by mouth every 12 (twelve) hours     atorvastatin (LIPITOR) 80 MG tablet     Take 80 mg by mouth daily     azaTHIOprine (IMURAN) 50 MG tablet     Take 50 mg by mouth daily        Colchicine 0.6 MG capsule     Take 0.6 mg by mouth daily     ezetimibe (ZETIA) 10 MG tablet     Take 10 mg by mouth daily     ferrous sulfate 325 (65 FE) MG tablet     Take 325 mg by mouth Once every Monday, Wednesday and Friday morning Taking on Monday wed, friday        furosemide (LASIX) 40 MG tablet     Take 40 mg by mouth daily Per pt she takes 40mg  and 20 mg alternating daily , took 20 mg on 01/16/2020       levothyroxine (SYNTHROID) 100 MCG tablet     Take 50 mcg by mouth Once a day at 6:00am        macitentan (OPSUMIT) 10 MG tablet     Take 10 mg by mouth daily     OXYGEN-HELIUM IN     Inhale into the lungs     potassium chloride (K-DUR) 10 MEQ tablet     Take 10 mEq by mouth     Selexipag (Uptravi) 1600 MCG Tab     Take 1,600 mg by mouth 2 (two) times daily     vitamin D (CHOLECALCIFEROL) 25 MCG (1000 UT) tablet     Take 2,000 Units by mouth daily             Allergies:  Allergies   Allergen Reactions    Amiodarone Other (See Comments)     Lung toxicity    Cellcept [Mycophenolate]      Gastro  hemorrhages     Zyban [Bupropion] Itching       Family History:  Family History   Problem Relation Age of Onset    Alzheimer's disease Mother     Lung cancer Father        Social History:  Social History     Socioeconomic History    Marital status: Single     Spouse name: Not on file    Number of children: Not on file    Years of education: Not on file    Highest education level: Not on file   Occupational History    Not on file   Tobacco Use    Smoking status: Former Smoker     Years: 20.00     Types: Cigarettes    Smokeless tobacco: Never Used    Tobacco comment: stopped 20 years ago   Vaping Use    Vaping Use: Never used  Substance and Sexual Activity    Alcohol use: Never    Drug use: Never    Sexual activity: Not on file   Other Topics Concern    Not on file   Social History Narrative    Not on file     Social Determinants of Health     Financial Resource Strain:     Difficulty of Paying Living Expenses: Not on file   Food Insecurity:     Worried About Running Out of Food in the Last Year: Not on file    Ran Out of Food in the Last Year: Not on file   Transportation Needs:     Lack of Transportation (Medical): Not on file    Lack of Transportation (Non-Medical): Not on file   Physical Activity:     Days of Exercise per Week: Not on file    Minutes of Exercise per Session: Not on file   Stress:     Feeling of Stress : Not on file   Social Connections:     Frequency of Communication with Friends and Family: Not on file    Frequency of Social Gatherings with Friends and Family: Not on file    Attends Religious Services: Not on file    Active Member of Clubs or Organizations: Not on file    Attends Banker Meetings: Not on file    Marital Status: Not on file   Intimate Partner Violence:     Fear of Current or Ex-Partner: Not on file    Emotionally Abused: Not on file    Physically Abused: Not on file    Sexually Abused: Not on file   Housing Stability:     Unable to  Pay for Housing in the Last Year: Not on file    Number of Places Lived in the Last Year: Not on file    Unstable Housing in the Last Year: Not on file          Objective:   Vital Signs in last 24 hours:  Temp:  [97.3 F (36.3 C)-97.6 F (36.4 C)] 97.6 F (36.4 C)  Heart Rate:  [66-77] 71  Resp Rate:  [17-18] 18  BP: (85-113)/(57-81) 95/60  SpO2: 91 %  O2 Device: Nasal cannula         Telemetry: V-paced        Intake / Output:    Intake/Output Summary (Last 24 hours) at 02/02/2020 1415  Last data filed at 02/02/2020 1153  Gross per 24 hour   Intake 200 ml   Output 300 ml   Net -100 ml          Weight:  Weight Monitoring 02/01/2020 02/01/2020 02/02/2020   Height 157.5 cm 157.5 cm -   Height Method - - -   Weight 82.283 kg 82.283 kg 82.6 kg   Weight Method - - Standing Scale   BMI (calculated) 33.2 kg/m2 33.2 kg/m2 -        Weight change:        Physical Exam:   General: well-developed, alert, NAD  HEENT: conjunctiva pale, bucal mucosa moist, anicteric sclera, no erythema, JVP ~ 10 cm H2O, no carotid bruits, carotid pulses are 2+ bilaterally  Lungs: decreased air entry bilateral  Cardiovascular: loud P2, normal rate, regular rhythm, no m/r/g, PMI laterally displaced  Abdominal: soft, NT/ND, +B.S., no HSM  Extremities: warm to touch, no cyanosis, No edema  Neuromuscular exam: grossly non-focal, though not formally tested  LDAs:  Patient Lines/Drains/Airways Status     Active Lines, Drains and Airways     Name Placement date Placement time Site Days    Peripheral IV 02/01/20 22 G Left Forearm 02/01/20  1930  Forearm  less than 1                  Labs:  CBC        02/02/20  0042 02/01/20  1943   WBC 6.42 5.55   Hgb 12.2 11.9   Hematocrit 37.5 36.5   Platelets 280 307       BMP        02/02/20  0042 02/01/20  1943 02/01/20  1809   BUN 21.0*  --  19.0   Creatinine 1.0  --  1.2*   EGFR >60.0  --  53.0   Sodium 141  --  140   Potassium 3.9  --  4.1   Chloride 114*  --  111   CO2 16*  --  19*   Calcium 9.0  --  9.3    Magnesium 1.7  --   --    B-Natriuretic Peptide  --  92  --        LFTs        02/02/20  0042 02/01/20  1809   AST (SGOT) 14 15   ALT 9 9   Alkaline Phosphatase 107* 114*   Bilirubin, Total 0.5 0.6   Bilirubin Direct  --  0.2   Bilirubin Indirect  --  0.4       Coags        02/01/20  1943   PT 14.0*   PT INR 1.2*       Tacrolimus Levels         ABGs        Microbiology  Microbiology Results (last 15 days)     ** No results found for the last 360 hours. **            Cardiac Studies:  Echocardiogram:  Echo Results     Procedure Component Value Units Date/Time    Echocardiogram Adult W Contrast Complete W Color Doppler [[id number]] Collected: 02/02/20 0817     Updated: 02/02/20 1211     RV Basal Diastolic Dimension 3.66     AV Mean Gradient 3     BP Mod LV Ejection Fraction 39.9     Prox Ascending Aorta Diameter 3.1     LVID diastole (2D) 4.68     LVID systole (2D) 3.36     IVS Diastolic Thickness (2D) 0.835     Ao Root Diameter (2D) 3     LA Dimension (2D) 5.4     AV Peak Velocity 132     TAPSE 0.716     TAPSE 0.966     MV E/A 1.3     MV E/A 1.332     AV Area (Cont Eq VTI) 1.93     AV Area (Cont Eq VTI) 1.924     RV Systolic Pressure 81     RV Systolic Pressure 86.854     MV Area (PHT) 3.993     LA Volume Index (BP A-L) 0.072     MV E/e' (Average) 18.465     MV Regurgitation Severity no     TV Regurgitation Severity moderate     AV Regurgitation Severity no     PV Regurgitation Severity mild     Aortic Valve Findings There isno  aortic regurgitation.     Aortic Valve Findings There isno aortic stenosis.     Aortic Valve Findings The aortic valve is tricuspid.     Pulmonary Valve Findings The pulmonic valve is grossly normal     Pulmonary Valve Findings There ismild pulmonic regurgitation.     Mitral Valve Findings The mitral valve is thickened and calcified without stenosis     Mitral Valve Findings There is moderate posteriormitral annular calcification.     Mitral Valve Findings There is no mitral regurgitation      Tricuspid Valve Findings Severe pulmonary hypertension withestimated right ventricular systolic pressureof  87 mmHg.     Tricuspid Valve Findings There is moderatetricuspidregurgitation     Tricuspid Valve Findings The tricuspid valve is structurally normal.    Narrative:      Name:     [name]  Age:     74 years  DOB:     [birth year]  Gender:     Female  MRN:     [id number]  Wt:     182 lb  BSA:     1.94 m2  Systolic BP:     865 mmHg  Diastolic BP:     67 mmHg  Technical Quality:     Adequate  Exam Date/Time:     02/02/2020 8:17 AM  Exam Type:     ECHO ADULT TTE 3D M-MODE CLR DOP WAV    Staff  Sonographer:     Lenda Kelp RDCS, RVS  Ordering Physician:     Tamala Julian    Study Info  Indications       - pah; question of lv dysfunction as well  Procedure    Complete two-dimensional, color flow and spectral Doppler transthoracic  echocardiogram is performed.    Optison was administered to enhance endocardial definition.    Reading Physician:     Thana Ates, MD    78 in      History/Risk Factors  Hypertension:     Yes  COPD:     Yes  Prior Heart Failure:     Yes  Arrhythmias:     Atrial fibrillation    Prior Cardiac Studies  Date of Prior Echo:     2019/06/07      Summary    * The left ventricle is normal in size.    * Left ventricular wall thickness is severely increased, particularly  towards the mid cavity and apex consistent with mid/apical hypertrophic  cardiomyopathy. 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    * Left ventricular systolic function is mildly decreased with an ejection  fraction by Biplane Method of Discs of  50 %.    * Left ventricular diastolic filling parameters are consistent with Grade II  diastolic dysfunction (pseudonormal pattern) with elevated left atrial  pressure.    * There is diastolic mital regurgitation consistent with AV dissociation.    * The right ventricular cavity size is normal in size.    * Moderately-to-severely decreased right ventricular  systolic function  (TAPSE 1.0 cm, 3D RVEF 28% and FAC 24%).    * The left atrium is severely dilated.    * The right atrium is dilated.    * There is moderate tricuspid regurgitation.    * Severe pulmonary hypertension with estimated right ventricular systolic  pressure of  87 mmHg.    * No prior study is available for comparison.      Findings  Left  Ventricle    The left ventricle is normal in size.    Left ventricular wall thickness is severely increased, particularly towards  the mid cavity and apex consistent with mid/apical hypertrophic  cardiomyopathy. There is also a small apical aneurysm.Marland Kitchen    Left ventricular systolic function is mildly decreased with an ejection  fraction by Biplane Method of Discs of  50 %.    There is paradoxical septal wall motion consistent with conduction  abnormality.    Left ventricular diastolic filling parameters are consistent with Grade II  diastolic dysfunction (pseudonormal pattern) with elevated left atrial  pressure.    There is diastolic mital regurgitation consistent with AV dissociation.    Contrast was administered and enabled adequate wall motion interpretation.  Right Ventricle    The right ventricular cavity size is normal in size.    Moderately-to-severely decreased right ventricular systolic function (TAPSE  1.0 cm, 3D RVEF 28% and FAC 24%).      Left Atrium    The left atrium is severely dilated.    Right Atrium    The right atrium is dilated.    Atrial Septum    No evidence of interatrial shunt by color Doppler.      Aortic Valve    The aortic valve is tricuspid.    There is no aortic stenosis.    There is no aortic regurgitation.    Pulmonary Valve    The pulmonic valve is grossly normal.    There is mild pulmonic regurgitation.    Mitral Valve    There is moderate posterior mitral annular calcification.    The mitral valve is thickened and calcified without stenosis.    There is no mitral regurgitation.    Tricuspid Valve    The tricuspid valve is structurally  normal.    There is moderate tricuspid regurgitation.    Severe pulmonary hypertension with estimated right ventricular systolic  pressure of 87 mmHg.      Other Findings    Pacer wire noted in the RA/RV.    Pericardium / Pleural Effusion    No pericardial effusion visualized.    Inferior Vena Cava    The IVC is dilated with > 50% respiratory variance consistent with elevated  RA pressure of 8 mmHg.    Aorta    The aortic root is normal in size.    The ascending aorta is normal in size.      Measurements  2D Measurements  ----------------------------------------------------------------------  Name                                 Value        Normal  ----------------------------------------------------------------------    Parasternal 2D  ----------------------------------------------------------------------  IVS Diastolic Thickness (2D)       6.04 cm     0.60-0.90   LVID Diastole (2D)                 4.68 cm     3.80-5.20   LVIW Diastolic Thickness  (2D)                               0.98 cm     0.60-0.95   LVID Systole (2D)                  3.36 cm     2.20-3.50   LVOT  Diameter                      1.90 cm                 LA Dimension (2D)                  5.40 cm     2.70-3.80   Ao Root Diameter (2D)              3.00 cm     2.70-3.70   Prox Asc Ao Diameter               3.10 cm     2.30-3.10     LV Ejection Fraction 2D  ----------------------------------------------------------------------  LV EF (BP MOD)                        50 %         54-74     Apical 2D Dimensions  ----------------------------------------------------------------------  RV Basal Diastolic Dimension       3.66 cm     2.50-4.10   LA Volume Index (BP A-L)       72.44 ml/m2       <=34.00   RA Area (4C)                     23.60 cm2       <=18.00  M-mode Measurements  ----------------------------------------------------------------------  Name                                 Value         Normal  ----------------------------------------------------------------------    M-Mode  ----------------------------------------------------------------------  TAPSE                              0.97 cm        >=1.60  LVOT/Aortic Valve Doppler Measurements  ----------------------------------------------------------------------  Name                                 Value        Normal  ----------------------------------------------------------------------    LVOT Doppler  ----------------------------------------------------------------------  LVOT Peak Velocity                0.91 m/s                   AoV Doppler  ----------------------------------------------------------------------  AV Peak Velocity                  1.32 m/s                 AV Peak Gradient                    7 mmHg                 AV Mean Gradient                    3 mmHg           <20   AV Area (Cont Eq VTI)             1.92 cm2        >=3.00   AV V1/V2 Ratio  0.69  RVOT/Pulmonic Valve Doppler Measurements  ----------------------------------------------------------------------  Name                                 Value        Normal  ----------------------------------------------------------------------    PV Doppler  ----------------------------------------------------------------------  PV Peak Velocity                  0.83 m/s  Mitral Valve Measurements  ----------------------------------------------------------------------  Name                                 Value        Normal  ----------------------------------------------------------------------    MV Doppler  ----------------------------------------------------------------------  MV E Peak Velocity                0.75 m/s                 MV A Peak Velocity                0.56 m/s                 MV E/A                                1.33                   MV Annular TDI  ----------------------------------------------------------------------  MV Septal e'  Velocity             0.03 m/s        >=0.08   MV E/e' (Septal)                     22.91        <=8.00   MV Lateral e' Velocity            0.05 m/s        >=0.10   MV E/e' (Lateral)                    14.02        <=8.00  Tricuspid Valve Measurements  ----------------------------------------------------------------------  Name                                 Value        Normal  ----------------------------------------------------------------------    TV Regurgitation Doppler  ----------------------------------------------------------------------  TR Peak Velocity                  4.44 m/s                 RA Pressure                         8 mmHg           <=3   RV Systolic Pressure               87 mmHg           <36  Aorta / Venous Measurements  ----------------------------------------------------------------------  Name  Value        Normal  ----------------------------------------------------------------------    IVC/SVC  ----------------------------------------------------------------------  IVC Diameter (Exp 2D)              2.65 cm        <=2.10      Report Signatures  Finalized by Thana Ates  MD on 02/02/2020 12:11 PM  Promoted by Ree Kida on 02/02/2020 10:50 AM          Right Heart Catheterization: 01/24/2020  Conclusions:    1.  Severe resting Pulm HTN.  2.  Normal resting PCWP.  3.  Severely reduced CO/CI.  4.  Mildly elevated PCWP with Exercise: only able to exercise to Stage 1 (2 minutes).  5.  Hemodynamics: Baseline: RA 10. RV 79/5. PA 83/38/53. PCW 16/16/14. BP (cuff) 150/71/102. Sats: RA 49%, PA 52%, AO (Pulse-Ox) 100%, 6L NC O2. Fick CO/CI 3.1/1.7, TD CO/CI 1.9/1.0. Exercise (2 minutes bicycle/Stage 1): PA 87/48/63. PCW 23/23/21. Sats: PA 19%, AO (Pulse-Ox) 100%, 6L NC O2. Fick CO/CI 1.9/1.0.  6.  No complications.  7.  Pain-free/hemodynamically-stable post-procedure.  8.  Right brachial vein sheath removed and hemostasis achieved with manual  compression.    Imaging:  Radiology Results (24 Hour)     Procedure Component Value Units Date/Time    XR Chest 2 Views [[id number]] Collected: 02/02/20 0825    Order Status: Completed Updated: 02/02/20 0837    Narrative:      HISTORY: New EF depression.    COMPARISON: Chest CT today    TECHNIQUE: AP upright  chest.    FINDINGS:    Pacer remains in place. Mild basilar atelectasis. The lungs are  otherwise clear with normal pulmonary vascularity. No pleural effusion  or pneumothorax. Interval increased prominence of cardiac silhouette. No  acute osseous changes.      Impression:        1. Mild basilar atelectasis.  2. Interval increased prominence of cardiac silhouette, technical or  consider the possibility of pericardial effusion. Correlate with  echocardiogram.  3. Discussed with RN [place] at 8: 32 AM on 02/02/2020.     Clide Cliff, MD   02/02/2020 8:34 AM    CT Chest High Resolution [[id number]] Collected: 02/02/20 0429    Order Status: Completed Updated: 02/02/20 0448    Narrative:      HISTORY: Interstitial lung disease.  74 year old female with history of  CHF, myocardial infarction, cardiomyopathy, COPD, atrial fibrillation.  Evaluate for interstitial lung disease.    COMPARISON: CT chest 09/16/2019.    TECHNIQUE: CT CHEST HIGH RESOLUTION WO CONTRAST: Helical imaging was  performed on a multislice CT scanner without oral contrast. IV contrast  was not given for this examination, as specifically requested. Axial  scans were obtained from the thoracic inlet through the diaphragms.  Coronal and sagittal reconstruction: MPR. Axial thick section MIP images  of the chest were also produced. In addition to standard chest imaging,  high-resolution supine images were acquired at end-inspiration and at  end-expiration. The following dose reduction techniques were utilized:  automated exposure control and/or adjustment of the mA and/or kV  according to patient's size, and the use of iterative  reconstruction  technique.    FINDINGS: Cardiac pacemaker is noted. Subpleural interlobular septal  thickening is noted in the right lower lobe. The lungs are otherwise  clear. There is no evidence of pneumonia. There is no sign of  honeycombing or other definite evidence of pulmonary fibrosis. The lungs  show  no suspicious nodules or masses. The inspiratory and expiratory  images show no evidence of air trapping. The groundglass opacities in  the lungs on prior expiratory imaging are no longer demonstrated. The  thoracic aorta is calcified without aneurysmal dilatation. The lack of  aortic contrast makes it impossible to exclude dissection.  However,  there is no sign of aortic intramural hematoma. Moderate calcifications  are noted in the coronary arteries. Mild to moderate cardiomegaly is  noted. Overall cardiac size is unchanged. No pneumothorax, pleural  effusion, or significant pericardial effusion is seen. There is no  evidence of mediastinal hemorrhage or pneumomediastinum. The trachea and  central bronchi are unremarkable. There is no sign of bronchiectasis.  Mildly enlarged lymph nodes in the anterior and middle mediastinum are  again noted and appear grossly unchanged.  The largest lymph node is  located the pretracheal retrovascular space and measures 2.2 x 1.5 cm.  No axillary adenopathy or chest wall lesions are identified. Spinal  degenerative changes are noted. The bony thorax and surrounding bony  structures otherwise appear unremarkable. No rib fractures are  identified. No neoplastic bone lesions are seen. The visualized portions  of the upper abdomen show no definite abnormality. A tiny sliding type  hiatal hernia is noted. The esophagus appears unremarkable.      Impression:       CT of the chest with high resolution imaging shows no  evidence of significant pulmonary interstitial lung disease or pulmonary  fibrosis; pulmonary groundglass opacity, seen on prior expiratory  imaging, is no longer  demonstrated. Cardiomegaly is unchanged. There is  no definite pneumonia or CHF. The examination shows mildly enlarged  mediastinal lymph nodes which are grossly unchanged from prior study.  Coronary artery calcifications are noted. Other findings as noted above.        Miguel Dibble, MD   02/02/2020 4:45 AM

## 2020-02-02 NOTE — Consults (Signed)
ARROW POWER MIDLINE INSERTION PROCEDURE       INDICATIONS: Therapy less than 28 days    The midline procedure, risks, benefits were discussed with thepatient.  All questions were answered  patient verbalized understanding and agreed to proceed. Midline education/instructions provided to patient.    PROCEDURE DETAILS:   The patient was positioned and Ultrasound was used to confirm patency of the Left Basilic vein prior to obtaining venous access. The arm was scrubbed with 2% chlorhexidine per guidelines and a maximal sterile field was established for the patient.  The clinician was attired with cap, mask and sterile gown/gloves prior to start.     Arrow Power Midline:    A sterile cover was sheathed to the Ultrasound probe.  The vein was then revisualized and 1% lidocaine injected prior to puncture of the Left Basilic vein with a 21-gauge single-wall needle under direct sonographic guidance.  The guidewire was advanced through the needle, the needle was removed and a peel-away sheath was placed over the wire.  After measuring from the insertion site to the midline of the upper arm the catheter was trimmed to insure tip location below the axillary.  The catheter was then advanced through the peel-away sheath.  The sheath was removed.  The catheter was flushed with normal saline to confirm brisk blood return and capped.  The catheter was stabilized on the skin using a securement device.  Sterile transparent occlusive dressing applied using aseptic technique. Gauze dressing applied. Informed RN to change gauze dressing in 48 hours.    Patient did tolerate the procedure well.   Catheter Type: 4Fr Arrow Power Midline  Insertion Site: Left Basilic vein  Total length: 15cm  Internal Length:  15cm  External Length: 0cm  UAC: 40cm    Midline Reference #: [id number]  Midline Kit Lot#: [id number]  Midline Kit expiration date: 04-06-2021    Findings/Conclusions:  No signs of bleeding or symptoms of nerve irritation noted at  time of insertion procedure.    Tip location is below the level of the axilla in Basilic vein. Midline is ready for immediate use.    Midline is ready for immediate use    Patrici Ranks, RN

## 2020-02-02 NOTE — Plan of Care (Signed)
A&Ox4. VSS. Denies pain at this time. High resolution CT & 2 view CXR completed.     Safety precautions maintained throughout shift. Bed in low position, call light within reach. Floor mat present, bed alarm activated. Hourly rounding done. Needs met at this time.     --diuresis with IV lasix   --PT/OT  --ECHO   --daily weight, strict I&Os   --rheum consult           Problem: Moderate/High Fall Risk Score >5  Goal: Patient will remain free of falls  Outcome: Progressing     Problem: Inadequate Gas Exchange  Goal: Adequate oxygenation and improved ventilation  Outcome: Progressing     Problem: Impaired Mobility  Goal: Mobility/Activity is maintained at optimal level for patient  Outcome: Progressing     Problem: Hemodynamic Status: Cardiac  Goal: Stable vital signs and fluid balance  Outcome: Progressing     Problem: Ineffective Gas Exchange  Goal: Effective breathing pattern  Outcome: Progressing     Problem: Safety  Goal: Patient will be free from injury during hospitalization  Outcome: Progressing  Goal: Patient will be free from infection during hospitalization  Outcome: Progressing

## 2020-02-02 NOTE — OT Progress Note (Signed)
Occupational Therapy Cancellation Note    Patient: [name]  ZOX:[id number]    Unit: FI319/FI319-01    Patient not seen for occupational therapy secondary to with PT counterpart. Will follow up as time permits.     Shay Hagerty OTS    Supervising therapist present in the room, guiding and directing the student in one on one service to the patient.  This supervising therapist is making the skilled judgment, and is responsible for the assessment and treatment of the patient.    Chancy Hurter OTR/L   Pager # [phone]

## 2020-02-02 NOTE — PT Eval Note (Signed)
[facility]   Physical Therapy Evaluation   Patient: [name]    MRN#: [id number]   [facility]  Bed: FI319/FI319-01      Post Acute Care Therapy Recommendations:   Discharge Recommendations: Home with supervision, Home with home health PT     Milestones to be reached to achieve recommendation: progression of ambulation distance; stair training  Anticipate achievement in 2 sessions    DME Recommended for Discharge: Patient already has needed equipment    Therapy discharge recommendations may change with patient status.  Please refer to most recent note for up-to-date recommendations.    Assessment:   Significant Findings: none    [name] is a 74 y.o. female admitted 02/01/2020.  Patient presents with impaired strength, balance, and endurance/activity tolerance.    Pt received seated on shower chair in bathroom. Nurse and pt agreeable to skilled PT. Pt completed sit <> stand and ambulated 61ft during shower chair > bed transfer with CGA. Pt demonstrating decreased cadence, slight lateral sway, and unsteadiness during ambulation. Pt states feeling lightheaded (BP checked and stable). At end of PT session, pt seated at EOB and educated on the importance of mobility while in the hospital to prevent functional decline. Pt would continue to benefit from skilled PT to maximize functional mobility and independence.     Impairments: Assessment: Decreased UE strength; Decreased LE strength; Decreased safety/judgement during functional mobility; Decreased endurance/activity tolerance; Decreased functional mobility; Decreased balance; Gait impairment.     Therapy Diagnosis: Impaired mobility.     Rehabilitation Potential: Prognosis: Good; With continued PT status post acute discharge    Treatment Activities: PT Evaluation.     Educated the patient to role of physical therapy, plan of care, goals of therapy and HEP, safety with mobility and ADLs.    Plan:    Treatment/Interventions: Exercise, Gait training, Stair training, Functional transfer training, LE strengthening/ROM, Endurance training, Equipment eval/education, Bed mobility     PT Frequency: 3-4x/wk   Risks/Benefits/POC Discussed with Pt/Family: With patient        Precautions and Contraindications:   Weight Bearing Status: no restrictions  Other Precautions: fall risk; monitor BP    Consult received for [name] for PT Evaluation and Treatment.  Patients medical condition is appropriate for Physical therapy intervention at this time.    Medical Diagnosis: Pulmonary hypertension [I27.20]  SOB (shortness of breath) [R06.02]    History of Present Illness:   [name] is a 74 y.o. 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  Very low index on recent RHC.  Unclear if biV or R sided dysfunction driving this - per pt chart.     Past Medical/Surgical History:  Past Medical History:   Diagnosis Date    A-fib     Cardiomyopathy     CKD (chronic kidney disease)     Congestive heart failure     COPD (chronic obstructive pulmonary disease)     COVID-19     Disorder of thyroid     Gastrointestinal hemorrhage     HTN (hypertension)     NSTEMI (non-ST elevated myocardial infarction)     During LHC    Obesity     OSA on CPAP     PNA (pneumonia)      Past Surgical History:   Procedure Laterality Date    CARDIAC ABLATION      A-FIB ABLATION  CESAREAN SECTION      INSERT / REPLACE / REMOVE PACEMAKER      RIGHT HEART CATH ONLY INCL. CO AND SATS Right 01/24/2020    Procedure: RIGHT HEART CATH w/ exercise;  Surgeon: Burna Forts, MD;  Location: [facility];  Service: Cardiovascular;  Laterality: Right;  same day discharge       X-Rays/Tests/Labs:  Previous imaging reviewed by PT.  Lab Results   Component Value Date/Time    HGB 12.2 02/02/2020 12:42 AM    HCT 37.5 02/02/2020 12:42 AM    K 3.9 02/02/2020 12:42 AM    NA 141 02/02/2020 12:42  AM    INR 1.2 (H) 02/01/2020 07:43 PM    TROPI 0.05 02/02/2020 12:42 AM    TROPI 0.05 02/01/2020 09:34 PM       Social History:   Prior Level of Function:  Prior level of function: Ambulates independently, Ambulates with assistive device, Needs assistance with ADLs (assist with cooking/cleaning)  DME Currently at Home: ADL- Paediatric nurse, [place], [place], [place], [employer], Environmental consultant, [employer], IT sales professional    Home Living Arrangements:  Living Arrangements: Alone  Type of Home: House (in a senior community)  Home Layout: One level, Stairs to enter with rails (add number in comment) (4 STE)  DME Currently at Home: ADL- Paediatric nurse, [place], [employer] [place], [place], [employer], [address], [place], IT sales professional  Home Living - Notes / Comments: Pt states she has a HHA that come to the house 2-3 times a week to assist with cooking and cleaning. Pt's daughter also lives close by.    Subjective:   Patient is agreeable to participation in the therapy session. Nursing clears patient for therapy.     "I'm a little shaky."    Patient Goal: did not state today    Pain Assessment  Pain Assessment: No/denies pain    Objective:   Observation of Patient/Vital Signs:  Patient is seated at edge of bed with telemetry and peripheral IV, O2 at 4 liters/minute via nasal cannula in place.  Pt wore mask during therapy session: yes    Observation of Patient/Vital signs:  Vital signs:  BP prior to PT session: 96/69  BP following transfer to bed : 127/79     Cognition/Neuro Status  Arousal/Alertness: Appropriate responses to stimuli  Attention Span: Appears intact  Orientation Level: Oriented X4  Memory: Appears intact  Following Commands: Follows all commands and directions without difficulty  Safety Awareness: independent  Insights: Educated in Engineer, building services  Problem Solving: Able to problem solve independently  Motor Planning: intact  Coordination: intact    Neuro Status RETIRED  Motor Planning: intact  Coordination:  intact    Musculoskeletal Examination:  Gross ROM  Right Lower Extremity ROM: within functional limits  Left Lower Extremity ROM: within functional limits    Gross Strength  Right Lower Extremity Strength: within functional limits  Left Lower Extremity Strength: within functional limits     Functional Mobility:  Sit to Stand: Risk analyst  Stand to Sit: Risk analyst    Transfers  Chair to Bed: Advertising account executive Assist    Ambulation:  PMP - Progressive Mobility Protocol   PMP Activity: Step 6 - Walks in Room  Distance Walked (ft) (Step 6,7): 6 Feet     Ambulation: Contact Guard Assist (amb 57ft during SC > bed transfer)  Pattern: decreased cadence; decreased step length (slight lateral sway and unsteadiness)     Balance:  Sitting - Static: Good  Sitting - Dynamic: Good  Standing - Static: Good  Standing - Dynamic: Fair    Participation and Activity Tolerance:  Participation Effort: excellent  Endurance: Tolerates 10 - 20 min exercise with multiple rests    Patient left with call bell within reach, all needs met, SCDs off, fall mat up, bed alarm off as found, chair alarm n/a and all questions answered. RN notified of session outcome and patient response.     Goals:   Goals  Goal Formulation: With patient  Time for Goal Acheivement: 2 visits  Goals: Select goal  Pt Will Go Supine To Sit: with supervision  Pt Will Perform Sit to Stand: with supervision  Pt Will Transfer Bed/Chair: with supervision  Pt Will Ambulate: 51-100 feet; with supervision (using LRAD)  Pt Will Go Up / Down Stairs: 3-5 stairs; with stand by assist; With rail    Time of treatment:   PT Received On: 02/02/20  Start Time: 1108  Stop Time: 1130  Time Calculation (min): 22 min    PPE worn during session: procedural mask, goggles and gloves  Tech present: no  PPE worn by tech: N/A    Terie Purser  DPT, PT  Pager# [phone]

## 2020-02-02 NOTE — Progress Notes (Signed)
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     Plan:   # Severe pulmonary HTN  # Hx PAH, group 1, dx Feb 2015  - ALD following, appreciate recs  - Continue Uptravi  - Discontinue Opsumit per ALD  - CTA to eval for CTEPH per ALD  - Consulted palliative care per patient request to discuss goals of care and support, appreciate recs    # Hx HFpEF - dilated LA apical hypertrophic cardiomyopathy, s/p PPM. 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  - Rheum consulted, appreciate recs  - F/u rheum panel including ANA, CK, aldolase, HIV, TSH, fT4, RNP, SSA, SSB, myositis panel, dsDNA, Scl-70, RF, CCP  - Imuran 50mg  qd per pulm  - HRCT    # Hx OSA on CPAP  - Likely contributing to above  - Did not bring home CPAP machine, had recent move and has not had compliance monitored but has an appointment with a sleep physician in November    # Hx pAfb s/p ablation, 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 Treated with IV Iron with lowest Hgb  9.1.   - Daily CBC    #Jehova's Witness deferring transfusion  - Consider pt's preferences prior to interventions    # Hx COVID-19 PNA  - CT as above to eval for COVID fibrosis    # Hx Gout  - Continue home colchicine    # Hx Hypothyroidism  - TSH/fT4 as above  - Continue home syntrhoid    # Hx CKD  - Baseline Cr 1.2 in careeveyrwhere  - Monitor with daily BMP    Case discussed with: Carloyn Manner, MD    Safety Checklist:     DVT prophylaxis:  CHEST guideline (See page [phone]) Chemical   Foley:  Atkins Rn Foley protocol Not present   IVs:  Peripheral IV   PT/OT: Ordered   Daily CBC & or Chem ordered:  SHM/ABIM guidelines (see #5) Yes, due to clinical and lab instability   Reference for approximate charges of common labs: CBC auto diff - $76   BMP - $99   Mg - $79    Lines:     Patient Lines/Drains/Airways Status     Active PICC Line / CVC Line / PIV Line / Drain / Airway / Intraosseous Line / Epidural  Line / ART Line / Line / Wound / Pressure Ulcer / NG/OG Tube     Name Placement date Placement time Site Days    Peripheral IV 02/01/20 22 G Left Forearm 02/01/20  1930  Forearm  less than 1                 Disposition: (Please see PAF column for Expected D/C Date)   Today's date: 02/02/2020   Admit Date: 02/01/2020  4:16 PM   LOS: 1  Clinical Milestones: workup for pulm and cardio conditions, further TBD  Anticipated discharge needs: TBD      Subjective     CC: SOB (shortness of breath)    Interval History/24 hour events: Admitted for workup of dypsnea, consults placed    HPI per admit note:   [name] is a 74 y.o. 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 In the past has had clear CTs, normal EF. However on recent cardiac MRI at [facility] she had depressed EF to 35% with possible apical aneurysm, also  with recent RHC showed reduced CI.     She was referred to Dr. Elmer Bales group (recently moved to this area from [facility]) who subsequently advised her to present to [facility] for further care.     She endorses progressive SOB and DOE since COVID hospitalization March 2021. Some mild orthopnea. Mild LE edema. O/w She denies chest pain. No recent fevers, cough/congestion, abd pain, N/V/D, no other questions/cocnerns.    Subjective: [name] is feeling comfortable when seen on rounds, no current pain and no changes since admission history.    Review of Systems:     As per HPI    Physical Exam:     VITAL SIGNS PHYSICAL EXAM   Temp:  [97.3 F (36.3 C)-97.5 F (36.4 C)] 97.3 F (36.3 C)  Heart Rate:  [66-77] 77  Resp Rate:  [17-18] 17  BP: (85-113)/(57-81) 101/60      Intake/Output Summary (Last 24 hours) at 02/02/2020 4540  Last data filed at 02/01/2020 2100  Gross per 24 hour   Intake --   Output 100 ml   Net -100 ml    Physical Exam  General: comfortable, NAD  HEENT: NCAT, no icterus  Cardiovascular: regular rate and rhythm  Lungs: No gross wheezing, comfortable WOB  Abdomen: non-distended  Extremities: no significant edema  Derm: No lesions over exposed skin  Psych: patient teary-eyed, appropriate mood and congruent affect       Meds:     Medications were reviewed:  Current Facility-Administered Medications   Medication Dose Route Frequency    allopurinol  50 mg Oral Daily    apixaban  5 mg Oral Q12H SCH    atorvastatin  80 mg Oral QHS    azaTHIOprine  50 mg Oral Daily    colchicine  0.6 mg Oral Daily    ezetimibe  10 mg Oral QHS    ferrous sulfate  324 mg Oral Q48H    furosemide  20 mg Intravenous Daily    levothyroxine  50 mcg Oral Daily at 0600    macitentan  10 mg Oral Daily    selexipag  1,600 mcg Oral Q12H SCH    vitamin D  50 mcg Oral Daily     Current Facility-Administered Medications  Medication Dose Route Frequency Last Rate     Current Facility-Administered Medications   Medication  Dose Route    acetaminophen  650 mg Oral    Or    acetaminophen  650 mg Rectal    dextrose  15 g of glucose Oral    And    dextrose  12.5 g Intravenous    And    glucagon (rDNA)  1 mg Intramuscular    melatonin  3 mg Oral    naloxone  0.2 mg Intravenous         Labs:     Labs (last 72 hours):    Recent Labs   Lab 02/02/20  0042 02/01/20  1943   WBC 6.42 5.55   Hgb 12.2 11.9   Hematocrit 37.5 36.5   Platelets 280 307       Recent Labs   Lab 02/01/20  1943   PT 14.0*   PT INR 1.2*    Recent Labs   Lab 02/02/20  0042 02/01/20  1809   Sodium 141 140   Potassium 3.9 4.1   Chloride 114* 111   CO2 16* 19*   BUN 21.0* 19.0   Creatinine 1.0 1.2*   Calcium 9.0 9.3   Albumin 3.6 4.0   Protein, Total 6.5 6.9   Bilirubin, Total 0.5 0.6   Alkaline Phosphatase 107* 114*   ALT 9 9   AST (SGOT) 14 15   Glucose 96 92                   Microbiology, reviewed and are significant for:  Microbiology Results (last 15 days)     ** No results found for the last 360 hours. **          Imaging, reviewed and are significant for:  CT Chest High Resolution    Result Date: 02/02/2020   CT of the chest with high resolution imaging shows no evidence of significant pulmonary interstitial lung disease or pulmonary fibrosis; pulmonary groundglass opacity, seen on prior expiratory imaging, is no longer demonstrated. Cardiomegaly is unchanged. There is no definite pneumonia or CHF. The examination shows mildly enlarged mediastinal lymph nodes which are grossly unchanged from prior study. Coronary artery calcifications are noted. Other findings as noted above. Miguel Dibble, MD  02/02/2020 4:45 AM    '      Signed by: Zandra Abts, MD

## 2020-02-02 NOTE — Consults (Signed)
RHEUMATOLOGY Attending note-   Patient seen and examined with Dr. Marchia Bond . 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 Diagnosis of +ve SSA52/ myositis associated ILD, PAF s/p ablation, Hypertrtophic cardiomyopathy, HFrEF (w hypokinetic left side) on latest cardiac MRI, OSA- diag in 2014, gout. COVID in 06/2019 admitted on 10/27 for further w/up and management of her severe PAH.     Phtn  Severe. Getting optimized Treatment with ALD  HFrEF- adv HF to see patient    ILD-  Previously followed at [facility] [address]- Dr. Deno Etienne.    Was started on MMF in 08/2017- did not tolerate due to GI bleed.   Imuran- 04/2018- till date. 100mg /day initially-  On evaluation here- dose has been decreased to 50mg /day.     +ve FH of Systemic Lupus Erythematosus in sister, MS in another sister, and good pasteur's in cousin    reviewed all records in care everywhere including labs/ and previous HRCT chest scan reports from 2019 and 2020.   Discussed with Dr. Leotis Shames.  Did not have classic changes suggestive of ILD per reports--  Negative for subpleural reticulation, traction bronchiectasis/bronchiolectasis, ground-glass, architectural distortion or honeycombing. Mild mosaic pulmonary parenchymal attenuation in the lower lobes with air trapping on expiratory phase imaging... (on scan from 2020). Also most recent HRCT chest here is completely unremarkable for ILD.    wonder if the ? mosaic attenuation previously was more fluid.       Clinically no history or current evidence of muscle weakness/ inflammatory arthralgia's/ sicca symptoms/ raynaud's.   Reviewed previous lab results. +ve ANA with SSA52- (low positive on LC myositis panel). Also aldolase 8.9 with normal CPK from 2019.  Note of +ve RF but was only 14.1.   Also on exam- normal 5/5 muscle strength diffusely, and normal nail fold  capillaries.     In the absence of any symptoms, and no classic ILD changes- I would not favour further Treatment for ILD at this current time.  I have discussed w Dr. Leotis Shames- and agree with stopping azathiprine while optimizing Phtn management.     Though- while we are stopping the AZA- I want to continue to follow the patient closely in Rheumatology clinic to ensure no recurrence of any ILD/ and also closely watch for any myositis symptoms.    Jethro Bolus, MD  [facility]

## 2020-02-02 NOTE — Plan of Care (Signed)
Pt A&Ox4, VSS. V.paced on tele, HR 70-80s, 4L NC at rest, 6L NC w/ambulation. NPO at MD for possible EP procedure tomorrow, pt refused evening eliquis dose d/t procedure. Report given to night RN Melissa.    Problem: Moderate/High Fall Risk Score >5  Goal: Patient will remain free of falls  Outcome: Progressing     Problem: Inadequate Gas Exchange  Goal: Adequate oxygenation and improved ventilation  Outcome: Progressing     Problem: Impaired Mobility  Goal: Mobility/Activity is maintained at optimal level for patient  Outcome: Progressing     Problem: Hemodynamic Status: Cardiac  Goal: Stable vital signs and fluid balance  Outcome: Progressing     Problem: Ineffective Gas Exchange  Goal: Effective breathing pattern  Outcome: Progressing     Problem: Safety  Goal: Patient will be free from injury during hospitalization  Outcome: Progressing  Goal: Patient will be free from infection during hospitalization  Outcome: Progressing

## 2020-02-02 NOTE — Plan of Care (Signed)
Problem: Inadequate Gas Exchange  Goal: Adequate oxygenation and improved ventilation  Outcome: Progressing  Flowsheets (Taken 02/02/2020 1706)  Adequate oxygenation and improved ventilation:   Assess lung sounds   Monitor SpO2 and treat as needed   Provide mechanical and oxygen support to facilitate gas exchange   Position for maximum ventilatory efficiency     Problem: Hemodynamic Status: Cardiac  Goal: Stable vital signs and fluid balance  Outcome: Progressing  Flowsheets (Taken 02/02/2020 1706)  Stable vital signs and fluid balance:   Monitor/assess vital signs and telemetry per unit protocol   Assess signs and symptoms associated with cardiac rhythm changes   Weigh on admission and record weight daily   Monitor intake/output per unit protocol and/or LIP order   Monitor for leg swelling/edema and report to LIP if abnormal   Monitor lab values     Problem: Safety  Goal: Patient will be free from injury during hospitalization  Outcome: Progressing  Flowsheets (Taken 02/02/2020 1706)  Patient will be free from injury during hospitalization:   Include patient/ family/ care giver in decisions related to safety   Ensure appropriate safety devices are available at the bedside   Assess patient's risk for falls and implement fall prevention plan of care per policy   Provide and maintain safe environment   Use appropriate transfer methods   Hourly rounding   Assess for patients risk for elopement and implement Elopement Risk Plan per policy   Provide alternative method of communication if needed (communication boards, writing)     Problem: Safety  Goal: Patient will be free from infection during hospitalization  Outcome: Progressing  Flowsheets (Taken 02/02/2020 1706)  Free from Infection during hospitalization:   Assess and monitor for signs and symptoms of infection   Monitor all insertion sites (i.e. indwelling lines, tubes, urinary catheters, and drains)   Monitor lab/diagnostic results   Encourage patient and  family to use good hand hygiene technique

## 2020-02-02 NOTE — Plan of Care (Signed)
I have seen and personally examined the patient. Discussed with the resident team, detailed note to follow. In brief:    Assessment/Plan:    74 yr old female with PMH as below and most notably recently diagnosed myositis, ILD, severe pulmonary hypertension and poor cardiac function who presents with worsening dyspnea.  Her etiology is concerning for progression of severe pulmonary hypertension as well as biventricular cardiac dysfunction.    Patient is TTE today with LVEF 50%, mid/apical hypertrophic cardiomyopathy, paradoxical septal wall motion that is concerning for conduction abnormality, GDII, AV dissociation,severeely depressed RV (TAPSE 1.0cm) and severe pHTN with RVSP of .  High resolution CT chest without significant fibrosis to explain patient's symptoms.  Patient's prior work-up for myositis/antisynthetase diseases would be confounding needs further clarification.    #Sever pHTN  #Bi-Ventricular heart failure  #Hypertrophic cardiomyopathy on TTE  #? Myositis/ anti-synthetase,unclear if truly has rheumatological disease  #OSA on CPAP  #Hx of atrial fib/ flutter (s/p ablation); Has single chanber MDT PM ([facility] Lead) 01/2019  #Hx of prior GI bleed 11/2019 (pt was not eliquis at the time per notes)  #COVID 19 PNA 06/2019  #Jehova's witness patient; agreeable to Iron infusions    #Hx of gout, on daily colchicine and allopurinol?    Plan:  -Patient's pulmonary hypertension etiology needs to be comprehensively evaluated and worked up.  She has progressively worsening cardiac function and symptoms.  She now has concerns for biventricular heart failure.  Furthermore, it is unclear to me if patient truly has rheumatological disease.  MSK, gout-like symptoms have been her primary complaints and her work-up reviewed in care everywhere has been minimal and not consistent.  He is currently immunosuppression.  Patient is understanding of her presentation her current concerns.  -Advanced lung disease following  appreciate recommendations  -CTA ordered to rule out CTEPH  -will attempt diuresis but possibly may need milrinone   -opsimut d/c but cont uptravi   -will consult AHF for assistance   -EP consulted to adjust PM to help with cardiac ouptut, PM lower rate increased from 70 to 80 bpm  -Rheumatology consulted while serum studies pending; favor cessation/lowering of immunosuppression as unclear if truly with rheumatological disease and certainly no pulmonary manifestation at this time  -palliative care consulted per patients request--tearful but realistic about her presentation  -rest of are per resident note    Sandria Senter, MD,PhD  [facility]  Pager [phone]       Portions of this record have been created with voice recognition software and as such, unanticipated grammatical, syntax, homophones, and other interpretive errors can be inadvertently transcribed.  Please disregard these errors.  Please excuse any errors that have escaped final proofreading.

## 2020-02-02 NOTE — Consults (Signed)
RHEUMATOLOGY HOSPITAL CONSULTATION    Date Time: 02/02/20 2:19 PM  Patient Name: [name]  Requesting Physician: Diane Manner, MD      Reason for Consultation:   Concern for CTD    Assessment:     Active Hospital Problems    Diagnosis    SOB (shortness of breath)     [name] is a 74 y.o. 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 While she is +ANA positive with positive Ro-52 in the past, she does not have signs of symptoms of systemic rheumatologic conditions including weakness, myalgias, CTD or ILD. Per discussion with ALD, her mosaic attenuation may be 2/2 pulmonary edema, and her positive serologies may be a reflection of her strong family history.     Plan:   1) Recommend discontinuing azathioprine given no evidence of myositis or ILD unless it is beneficial for the management of her pulmonary hypertension  2) Continue allopurinol and colchicine for her gout, will order uric acid level in am and see if the former needs to be adjusted  3) PAH management per ALD      History:   [name] is a 74 y.o. 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    Per the [facility] documentation, she was diagnosed with PAH in 2015 with mild PAH and normal wedge and RA pressures. With treatment her PAH improved, but she continued to be hypoxemic requiring 2L nasal canula to maintain her oxygen saturation. She was referred to rheumatology in 2019 where she was found to have positive P-ANCA (1:40), positive aldolase ~8 and Ro-52.  She was initiated on mycophenolate in 2019 but developed a LGIB on 1000 mg BID and was stopped in 12/2017. At [facility] she was started on azathioprine 100 mg qHS but was still requiring 4L nasal canula. This was followed by diagnosis of aflutter s/p cardioversion and CT scan from 11/18/2018 showed PAH without ILD only mosaic attenuation.  She followed Dr [name] last seen in 03/10/2019 where she was noted to have back pain and R foot swelling determined to be gouty flare.     She moved to the [place] area in 06/2019 where she was hospitalized for COVID PNA which did not require HFNC or intubation. 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 Sh established care with ALD on 12/30/2019 where her Imuran was decreased to 50 mg qHS. She underwent RHC on 10/19 significant for RA 10, RV 79/5, PA 83/38/53, wedge 16/16/14, Fick CO/CI 3.1/1.78, Thermo CO/CI 1.9/1 c/w severe PAH and depressed CO/CI.  Given these findings she was sent for direct admission.     Since admission she edornses dyspnea with exertion, but denies muscle pain, joint pains, dry mouth other than expected for nasal canula, fever, chills, weight loss, arthritis, nausea or vomiting, She does not notice a difference with tapering of her Imuran.     She has a  strong family hx of autoimmune disease, with a sister with SLE and Goodpasture's, a niece with SLE, and another cousin with Diane.    She also has gout for which she takes allopurinol. Her last gouty attack was last month and affected the R toe. She also takes colchicine.    Past Medical History:     Past Medical History:   Diagnosis Date    A-fib     Cardiomyopathy     CKD (chronic kidney disease)     Congestive heart failure     COPD (chronic obstructive pulmonary disease)     COVID-19     Disorder of thyroid     Gastrointestinal hemorrhage     HTN (hypertension)     NSTEMI (non-ST elevated myocardial infarction)      During LHC    Obesity     OSA on CPAP     PNA (pneumonia)        Past Surgical History:     Past Surgical History:   Procedure Laterality Date    CARDIAC ABLATION      A-FIB ABLATION    CESAREAN SECTION      INSERT / REPLACE / REMOVE PACEMAKER      RIGHT HEART CATH ONLY INCL. CO AND SATS Right 01/24/2020    Procedure: RIGHT HEART CATH w/ exercise;  Surgeon: Diane Forts, MD;  Location: [facility];  Service: Cardiovascular;  Laterality: Right;  same day discharge       Family History:     Family History   Problem Relation Age of Onset    Alzheimer's disease Mother     Lung cancer Father        Social History:     Social History     Socioeconomic History    Marital status: Single     Spouse name: Not on file    Number of children: Not on file    Years of education: Not on file    Highest education level: Not on file   Occupational History    Not on file   Tobacco Use    Smoking status: Former Smoker     Years: 20.00     Types: Cigarettes    Smokeless tobacco: Never Used    Tobacco comment: stopped 20 years ago   Vaping Use    Vaping Use: Never used   Substance and Sexual Activity    Alcohol use: Never    Drug use: Never    Sexual activity: Not on file   Other Topics Concern    Not on file   Social History Narrative    Not on file     Social Determinants of Health     Financial Resource Strain:     Difficulty of Paying Living Expenses: Not on file   Food Insecurity:     Worried About Programme researcher, broadcasting/film/video in the Last Year: Not on file    [employer] of Food in the Last Year: Not on file   Transportation Needs:     Lack of Transportation (Medical): Not on file    Lack of Transportation (Non-Medical): Not on file   Physical Activity:     Days of Exercise per Week: Not on file    Minutes of Exercise per Session: Not on file   Stress:     Feeling of Stress : Not on file   Social Connections:     Frequency of Communication with Friends and Family:  Not on file    Frequency of  Social Gatherings with Friends and Family: Not on file    Attends Religious Services: Not on file    Active Member of Clubs or Organizations: Not on file    Attends Banker Meetings: Not on file    Marital Status: Not on file   Intimate Partner Violence:     Fear of Current or Ex-Partner: Not on file    Emotionally Abused: Not on file    Physically Abused: Not on file    Sexually Abused: Not on file   Housing Stability:     Unable to Pay for Housing in the Last Year: Not on file    Number of Places Lived in the Last Year: Not on file    Unstable Housing in the Last Year: Not on file       Allergies:     Allergies   Allergen Reactions    Amiodarone Other (See Comments)     Lung toxicity    Cellcept [Mycophenolate]      Gastro hemorrhages     Zyban [Bupropion] Itching       Medications:     Current Facility-Administered Medications   Medication Dose Route Frequency    allopurinol  50 mg Oral Daily    apixaban  5 mg Oral Q12H SCH    atorvastatin  80 mg Oral QHS    azaTHIOprine  50 mg Oral Daily    colchicine  0.6 mg Oral Daily    ezetimibe  10 mg Oral QHS    ferrous sulfate  324 mg Oral Q48H    furosemide  20 mg Intravenous Daily    levothyroxine  50 mcg Oral Daily at 0600    selexipag  1,600 mcg Oral Q12H SCH    vitamin D  50 mcg Oral Daily       Review of Systems:   A comprehensive review of systems was: negative besides mentioned in HPI already.     Physical Exam:     Vitals:    02/02/20 1157   BP:    Pulse:    Resp:    Temp:    SpO2: 91%       Intake and Output Summary (Last 24 hours) at Date Time    Intake/Output Summary (Last 24 hours) at 02/02/2020 1419  Last data filed at 02/02/2020 1153  Gross per 24 hour   Intake 200 ml   Output 300 ml   Net -100 ml       Gen.-NAD  Eyes-conjunctiva clear, EOMI, nasal canula in place  ENMT-face symmetric without lesions, lips, teeth and gums nml, OP clear  Heme/Lymph/Immuno- no cervical LAD  CV-Loud S2 relative to S1 otherwise soft and  intermittently irregular  Resp-nml effort, CTA bilat  GI-non-distended, +BS  Skin-no rash, No alopecia  Ext-Non-pitting edema to the thighs  MSK: No synovitis present in upper or lower ext joints. Knee's with osteoarthritic changes bilaterally  Neuro- Ao3, non focal. MAEW      Labs Reviewed:     Results     Procedure Component Value Units Date/Time    ANA IFA w reflex to Titer/Pattern/Antibody [[id number]]  (Abnormal) Collected: 02/01/20 1943     Updated: 02/02/20 1416     ANA Screen Positive     ANA Titer 1:640     ANA Pattern Speckled    Troponin I [[id number]] Collected: 02/02/20 0042    Specimen: Blood Updated: 02/02/20 0128  Troponin I 0.05 ng/mL     GFR [[id number]] Collected: 02/02/20 0042     Updated: 02/02/20 0114     EGFR >60.0    Comprehensive metabolic panel [[id number]]  (Abnormal) Collected: 02/02/20 0042    Specimen: Blood Updated: 02/02/20 0114     Glucose 96 mg/dL      BUN 95.2 mg/dL      Creatinine 1.0 mg/dL      Sodium 841 mEq/L      Potassium 3.9 mEq/L      Chloride 114 mEq/L      CO2 16 mEq/L      Calcium 9.0 mg/dL      Protein, Total 6.5 g/dL      Albumin 3.6 g/dL      AST (SGOT) 14 U/L      ALT 9 U/L      Alkaline Phosphatase 107 U/L      Bilirubin, Total 0.5 mg/dL      Globulin 2.9 g/dL      Albumin/Globulin Ratio 1.2     Anion Gap 11.0    Magnesium [[id number]] Collected: 02/02/20 0042    Specimen: Blood Updated: 02/02/20 0114     Magnesium 1.7 mg/dL     CBC without differential [[id number]]  (Abnormal) Collected: 02/02/20 0042    Specimen: Blood Updated: 02/02/20 0111     WBC 6.42 x10 3/uL      Hgb 12.2 g/dL      Hematocrit 47.4 %      Platelets 280 x10 3/uL      RBC 3.81 x10 6/uL      MCV 98.4 fL      MCH 32.0 pg      MCHC 32.5 g/dL      RDW 24 %      MPV 9.6 fL      Nucleated RBC 0.8 /100 WBC      Absolute NRBC 0.05 x10 3/uL     HIV-1/2 Ag/Ab 4th Gen. w/ Reflex [[id number]] Collected: 02/01/20 1943    Specimen: Blood Updated: 02/02/20 0027     HIV Ag/Ab, 4th Generation Non-Reactive     Rheumatoid factor [[id number]] Collected: 02/01/20 1943    Specimen: Blood Updated: 02/01/20 2358     Rheumatoid Factor <13.0    Ribonucleoprotein(RNP)Antibody [[id number]] Collected: 02/01/20 1943     Updated: 02/01/20 2320    Scleroderma (SCL-70) Antibody [[id number]] Collected: 02/01/20 1943     Updated: 02/01/20 2320    Double Stranded DNA (dsDNA) Antibody [[id number]] Collected: 02/01/20 1943    Specimen: Blood Updated: 02/01/20 2320    Troponin I [[id number]] Collected: 02/01/20 2134    Specimen: Blood Updated: 02/01/20 2205     Troponin I 0.05 ng/mL     TSH [[id number]] Collected: 02/01/20 1943    Specimen: Blood Updated: 02/01/20 2058     TSH 2.25 uIU/mL     T4, free [[id number]] Collected: 02/01/20 1943    Specimen: Blood Updated: 02/01/20 2058     T4 Free 0.87 ng/dL     CBC and differential [[id number]]  (Abnormal) Collected: 02/01/20 1943    Specimen: Blood Updated: 02/01/20 2056     WBC 5.55 x10 3/uL      Hgb 11.9 g/dL      Hematocrit 03.5 %      Platelets 307 x10 3/uL      RBC 3.75 x10 6/uL      MCV 97.3 fL      MCH 31.7 pg      MCHC 32.6 g/dL  RDW 24 %      MPV 10.1 fL      Neutrophils 69.8 %      Lymphocytes Automated 18.0 %      Monocytes 9.7 %      Eosinophils Automated 1.1 %      Basophils Automated 0.7 %      Immature Granulocytes 0.7 %      Nucleated RBC 0.9 /100 WBC      Neutrophils Absolute 3.87 x10 3/uL      Lymphocytes Absolute Automated 1.00 x10 3/uL      Monocytes Absolute Automated 0.54 x10 3/uL      Eosinophils Absolute Automated 0.06 x10 3/uL      Basophils Absolute Automated 0.04 x10 3/uL      Immature Granulocytes Absolute 0.04 x10 3/uL      Absolute NRBC 0.05 x10 3/uL     Cell MorpHology [[id number]]  (Abnormal) Collected: 02/01/20 1943     Updated: 02/01/20 2056     Cell Morphology Abnormal     Platelet Estimate Normal     Ovalocytes Present     Spherocytes 1+     Dual RBC Population Present     Giant Platelets Present    B-type Natriuretic Peptide [[id number]] Collected: 02/01/20 1943     Specimen: Blood Updated: 02/01/20 2038     B-Natriuretic Peptide 92 pg/mL     Creatine Kinase (CK) [[id number]] Collected: 02/01/20 1943    Specimen: Blood Updated: 02/01/20 2035     Creatine Kinase (CK) 84 U/L     Prothrombin time/INR [[id number]]  (Abnormal) Collected: 02/01/20 1943    Specimen: Blood Updated: 02/01/20 2030     PT 14.0 sec      PT INR 1.2    Cyclic citrul peptide antibody, IgG [[id number]] Collected: 02/01/20 1943    Specimen: Blood Updated: 02/01/20 2012    SM and SM/RNP Antibodies [[id number]] Collected: 02/01/20 1943     Updated: 02/01/20 2012    Aldolase [[id number]] Collected: 02/01/20 1943    Specimen: Blood Updated: 02/01/20 2011    Narrative:      Centrifuge and separate within 1 hour.  SST Not acceptable.    Basic Metabolic Panel [[id number]]  (Abnormal) Collected: 02/01/20 1809    Specimen: Blood Updated: 02/01/20 1851     Glucose 92 mg/dL      BUN 18.8 mg/dL      Creatinine 1.2 mg/dL      Calcium 9.3 mg/dL      Sodium 416 mEq/L      Potassium 4.1 mEq/L      Chloride 111 mEq/L      CO2 19 mEq/L      Anion Gap 10.0    Hepatic function panel (LFT) [[id number]]  (Abnormal) Collected: 02/01/20 1809    Specimen: Blood Updated: 02/01/20 1851     Bilirubin, Total 0.6 mg/dL      Bilirubin Direct 0.2 mg/dL      Bilirubin Indirect 0.4 mg/dL      AST (SGOT) 15 U/L      ALT 9 U/L      Alkaline Phosphatase 114 U/L      Protein, Total 6.9 g/dL      Albumin 4.0 g/dL      Globulin 2.9 g/dL      Albumin/Globulin Ratio 1.4    GFR [[id number]] Collected: 02/01/20 1809     Updated: 02/01/20 1851     EGFR 53.0  Rads:   Radiological Procedure reviewed.   Echocardiogram Adult W Contrast Complete W Color Doppler  Name:     MICOLE Best [name]  Age:     54 years  DOB:     1946/01/17  Gender:     Female  MRN:     [id number]  Wt:     182 lb  BSA:     1.94 m2  Systolic BP:     962 mmHg  Diastolic BP:     67 mmHg  Technical Quality:     Adequate  Exam Date/Time:     02/02/2020 8:17 AM  Exam Type:     ECHO ADULT  TTE 3D M-MODE CLR DOP WAV    Staff  Sonographer:     Lenda Kelp RDCS, RVS  Ordering Physician:     Tamala Julian    Study Info  Indications       - pah; question of lv dysfunction as well  Procedure    Complete two-dimensional, color flow and spectral Doppler transthoracic  echocardiogram is performed.    Optison was administered to enhance endocardial definition.    Reading Physician:     Thana Ates, MD    64 in    History/Risk Factors  Hypertension:     Yes  COPD:     Yes  Prior Heart Failure:     Yes  Arrhythmias:     Atrial fibrillation    Prior Cardiac Studies  Date of Prior Echo:     06-13-2019    Summary    * The left ventricle is normal in size.    * Left ventricular wall thickness is severely increased, particularly  towards the mid cavity and apex consistent with mid/apical hypertrophic  cardiomyopathy. 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    * Left ventricular systolic function is mildly decreased with an ejection  fraction by Biplane Method of Discs of  50 %.    * Left ventricular diastolic filling parameters are consistent with Grade II  diastolic dysfunction (pseudonormal pattern) with elevated left atrial  pressure.    * There is diastolic mital regurgitation consistent with AV dissociation.    * The right ventricular cavity size is normal in size.    * Moderately-to-severely decreased right ventricular systolic function  (TAPSE 1.0 cm, 3D RVEF 28% and FAC 24%).    * The left atrium is severely dilated.    * The right atrium is dilated.    * There is moderate tricuspid regurgitation.    * Severe pulmonary hypertension with estimated right ventricular systolic  pressure of  87 mmHg.    * No prior study is available for comparison.    Findings  Left Ventricle    The left ventricle is normal in size.    Left ventricular wall thickness is severely increased, particularly towards  the mid cavity and apex consistent with mid/apical hypertrophic  cardiomyopathy. There is also a small  apical aneurysm.Marland Kitchen    Left ventricular systolic function is mildly decreased with an ejection  fraction by Biplane Method of Discs of  50 %.    There is paradoxical septal wall motion consistent with conduction  abnormality.    Left ventricular diastolic filling parameters are consistent with Grade II  diastolic dysfunction (pseudonormal pattern) with elevated left atrial  pressure.    There is diastolic mital regurgitation consistent with AV dissociation.    Contrast was administered and enabled adequate wall motion interpretation.  Right Ventricle    The right ventricular cavity size is normal in size.    Moderately-to-severely decreased right ventricular systolic function (TAPSE  1.0 cm, 3D RVEF 28% and FAC 24%).    Left Atrium    The left atrium is severely dilated.    Right Atrium    The right atrium is dilated.    Atrial Septum    No evidence of interatrial shunt by color Doppler.    Aortic Valve    The aortic valve is tricuspid.    There is no aortic stenosis.    There is no aortic regurgitation.    Pulmonary Valve    The pulmonic valve is grossly normal.    There is mild pulmonic regurgitation.    Mitral Valve    There is moderate posterior mitral annular calcification.    The mitral valve is thickened and calcified without stenosis.    There is no mitral regurgitation.    Tricuspid Valve    The tricuspid valve is structurally normal.    There is moderate tricuspid regurgitation.    Severe pulmonary hypertension with estimated right ventricular systolic  pressure of 87 mmHg.    Other Findings    Pacer wire noted in the RA/RV.    Pericardium / Pleural Effusion    No pericardial effusion visualized.    Inferior Vena Cava    The IVC is dilated with > 50% respiratory variance consistent with elevated  RA pressure of 8 mmHg.    Aorta    The aortic root is normal in size.    The ascending aorta is normal in size.    Measurements  2D  Measurements  ----------------------------------------------------------------------  Name                                 Value        Normal  ----------------------------------------------------------------------    Parasternal 2D  ----------------------------------------------------------------------  IVS Diastolic Thickness (2D)       1.61 cm     0.60-0.90   LVID Diastole (2D)                 4.68 cm     3.80-5.20   LVIW Diastolic Thickness  (2D)                               0.98 cm     0.60-0.95   LVID Systole (2D)                  3.36 cm     2.20-3.50   LVOT Diameter                      1.90 cm                 LA Dimension (2D)                  5.40 cm     2.70-3.80   Ao Root Diameter (2D)              3.00 cm     2.70-3.70   Prox Asc Ao Diameter               3.10 cm     2.30-3.10     LV Ejection Fraction 2D  ----------------------------------------------------------------------  LV EF (BP MOD)  50 %         54-74     Apical 2D Dimensions  ----------------------------------------------------------------------  RV Basal Diastolic Dimension       3.66 cm     2.50-4.10   LA Volume Index (BP A-L)       72.44 ml/m2       <=34.00   RA Area (4C)                     23.60 cm2       <=18.00  M-mode Measurements  ----------------------------------------------------------------------  Name                                 Value        Normal  ----------------------------------------------------------------------    M-Mode  ----------------------------------------------------------------------  TAPSE                              0.97 cm        >=1.60  LVOT/Aortic Valve Doppler Measurements  ----------------------------------------------------------------------  Name                                 Value        Normal  ----------------------------------------------------------------------    LVOT Doppler  ----------------------------------------------------------------------  LVOT Peak Velocity                 0.91 m/s                   AoV Doppler  ----------------------------------------------------------------------  AV Peak Velocity                  1.32 m/s                 AV Peak Gradient                    7 mmHg                 AV Mean Gradient                    3 mmHg           <20   AV Area (Cont Eq VTI)             1.92 cm2        >=3.00   AV V1/V2 Ratio                        0.69  RVOT/Pulmonic Valve Doppler Measurements  ----------------------------------------------------------------------  Name                                 Value        Normal  ----------------------------------------------------------------------    PV Doppler  ----------------------------------------------------------------------  PV Peak Velocity                  0.83 m/s  Mitral Valve Measurements  ----------------------------------------------------------------------  Name                                 Value        Normal  ----------------------------------------------------------------------  MV Doppler  ----------------------------------------------------------------------  MV E Peak Velocity                0.75 m/s                 MV A Peak Velocity                0.56 m/s                 MV E/A                                1.33                   MV Annular TDI  ----------------------------------------------------------------------  MV Septal e' Velocity             0.03 m/s        >=0.08   MV E/e' (Septal)                     22.91        <=8.00   MV Lateral e' Velocity            0.05 m/s        >=0.10   MV E/e' (Lateral)                    14.02        <=8.00  Tricuspid Valve Measurements  ----------------------------------------------------------------------  Name                                 Value        Normal  ----------------------------------------------------------------------    TV Regurgitation Doppler  ----------------------------------------------------------------------  TR Peak Velocity                   4.44 m/s                 RA Pressure                         8 mmHg           <=3   RV Systolic Pressure               87 mmHg           <36  Aorta / Venous Measurements  ----------------------------------------------------------------------  Name                                 Value        Normal  ----------------------------------------------------------------------    IVC/SVC  ----------------------------------------------------------------------  IVC Diameter (Exp 2D)              2.65 cm        <=2.10    Report Signatures  Finalized by Thana Ates  MD on 02/02/2020 12:11 PM  Promoted by Ree Kida on 02/02/2020 10:50 AM  XR Chest 2 Views  Narrative: HISTORY: New EF depression.    COMPARISON: Chest CT today    TECHNIQUE: AP upright  chest.    FINDINGS:    Pacer remains in place. Mild basilar atelectasis. The lungs are  otherwise clear with normal pulmonary vascularity. No pleural effusion  or pneumothorax. Interval increased prominence of  cardiac silhouette. No  acute osseous changes.  Impression: 1. Mild basilar atelectasis.  2. Interval increased prominence of cardiac silhouette, technical or  consider the possibility of pericardial effusion. Correlate with  echocardiogram.  3. Discussed with RN [place] at 8: 32 AM on 02/02/2020.     Clide Cliff, MD   02/02/2020 8:34 AM  CT Chest High Resolution  Narrative: HISTORY: Interstitial lung disease.  74 year old female with history of  CHF, myocardial infarction, cardiomyopathy, COPD, atrial fibrillation.  Evaluate for interstitial lung disease.    COMPARISON: CT chest 09/16/2019.    TECHNIQUE: CT CHEST HIGH RESOLUTION WO CONTRAST: Helical imaging was  performed on a multislice CT scanner without oral contrast. IV contrast  was not given for this examination, as specifically requested. Axial  scans were obtained from the thoracic inlet through the diaphragms.  Coronal and sagittal reconstruction: MPR. Axial thick section MIP images  of the  chest were also produced. In addition to standard chest imaging,  high-resolution supine images were acquired at end-inspiration and at  end-expiration. The following dose reduction techniques were utilized:  automated exposure control and/or adjustment of the mA and/or kV  according to patient's size, and the use of iterative reconstruction  technique.    FINDINGS: Cardiac pacemaker is noted. Subpleural interlobular septal  thickening is noted in the right lower lobe. The lungs are otherwise  clear. There is no evidence of pneumonia. There is no sign of  honeycombing or other definite evidence of pulmonary fibrosis. The lungs  show no suspicious nodules or masses. The inspiratory and expiratory  images show no evidence of air trapping. The groundglass opacities in  the lungs on prior expiratory imaging are no longer demonstrated. The  thoracic aorta is calcified without aneurysmal dilatation. The lack of  aortic contrast makes it impossible to exclude dissection.  However,  there is no sign of aortic intramural hematoma. Moderate calcifications  are noted in the coronary arteries. Mild to moderate cardiomegaly is  noted. Overall cardiac size is unchanged. No pneumothorax, pleural  effusion, or significant pericardial effusion is seen. There is no  evidence of mediastinal hemorrhage or pneumomediastinum. The trachea and  central bronchi are unremarkable. There is no sign of bronchiectasis.  Mildly enlarged lymph nodes in the anterior and middle mediastinum are  again noted and appear grossly unchanged.  The largest lymph node is  located the pretracheal retrovascular space and measures 2.2 x 1.5 cm.  No axillary adenopathy or chest wall lesions are identified. Spinal  degenerative changes are noted. The bony thorax and surrounding bony  structures otherwise appear unremarkable. No rib fractures are  identified. No neoplastic bone lesions are seen. The visualized portions  of the upper abdomen show no definite  abnormality. A tiny sliding type  hiatal hernia is noted. The esophagus appears unremarkable.  Impression:  CT of the chest with high resolution imaging shows no  evidence of significant pulmonary interstitial lung disease or pulmonary  fibrosis; pulmonary groundglass opacity, seen on prior expiratory  imaging, is no longer demonstrated. Cardiomegaly is unchanged. There is  no definite pneumonia or CHF. The examination shows mildly enlarged  mediastinal lymph nodes which are grossly unchanged from prior study.  Coronary artery calcifications are noted. Other findings as noted above.    Miguel Dibble, MD   02/02/2020 4:45 AM        Signed by: Berenice Bouton, MD

## 2020-02-03 ENCOUNTER — Other Ambulatory Visit: Payer: Self-pay

## 2020-02-03 ENCOUNTER — Encounter: Payer: Self-pay | Admitting: Pulmonary Disease

## 2020-02-03 DIAGNOSIS — I5030 Unspecified diastolic (congestive) heart failure: Secondary | ICD-10-CM

## 2020-02-03 DIAGNOSIS — Z7901 Long term (current) use of anticoagulants: Secondary | ICD-10-CM

## 2020-02-03 DIAGNOSIS — Z9981 Dependence on supplemental oxygen: Secondary | ICD-10-CM

## 2020-02-03 DIAGNOSIS — R0602 Shortness of breath: Secondary | ICD-10-CM

## 2020-02-03 DIAGNOSIS — Z950 Presence of cardiac pacemaker: Secondary | ICD-10-CM

## 2020-02-03 DIAGNOSIS — I2721 Secondary pulmonary arterial hypertension: Secondary | ICD-10-CM

## 2020-02-03 DIAGNOSIS — IMO0001 Reserved for inherently not codable concepts without codable children: Secondary | ICD-10-CM | POA: Insufficient documentation

## 2020-02-03 DIAGNOSIS — R601 Generalized edema: Secondary | ICD-10-CM

## 2020-02-03 DIAGNOSIS — G4733 Obstructive sleep apnea (adult) (pediatric): Secondary | ICD-10-CM

## 2020-02-03 LAB — COMPREHENSIVE METABOLIC PANEL
ALT: 9 U/L (ref 0–55)
AST (SGOT): 14 U/L (ref 5–34)
Albumin/Globulin Ratio: 1.2 (ref 0.9–2.2)
Albumin: 3.6 g/dL (ref 3.5–5.0)
Alkaline Phosphatase: 111 U/L — ABNORMAL HIGH (ref 37–106)
Anion Gap: 10 (ref 5.0–15.0)
BUN: 18 mg/dL (ref 7.0–19.0)
Bilirubin, Total: 0.5 mg/dL (ref 0.2–1.2)
CO2: 17 mEq/L — ABNORMAL LOW (ref 22–29)
Calcium: 8.9 mg/dL (ref 7.9–10.2)
Chloride: 111 mEq/L (ref 100–111)
Creatinine: 1.2 mg/dL — ABNORMAL HIGH (ref 0.6–1.0)
Globulin: 2.9 g/dL (ref 2.0–3.6)
Glucose: 88 mg/dL (ref 70–100)
Potassium: 4.3 mEq/L (ref 3.5–5.1)
Protein, Total: 6.5 g/dL (ref 6.0–8.3)
Sodium: 138 mEq/L (ref 136–145)

## 2020-02-03 LAB — ECG 12-LEAD
Atrial Rate: 66 {beats}/min
Q-T Interval: 422 ms
QRS Duration: 132 ms
QTC Calculation (Bezet): 462 ms
R Axis: -43 degrees
T Axis: 123 degrees
Ventricular Rate: 72 {beats}/min

## 2020-02-03 LAB — CBC
Absolute NRBC: 0.04 10*3/uL — ABNORMAL HIGH (ref 0.00–0.00)
Hematocrit: 37.4 % (ref 34.7–43.7)
Hgb: 12.4 g/dL (ref 11.4–14.8)
MCH: 32.7 pg (ref 25.1–33.5)
MCHC: 33.2 g/dL (ref 31.5–35.8)
MCV: 98.7 fL — ABNORMAL HIGH (ref 78.0–96.0)
MPV: 9.8 fL (ref 8.9–12.5)
Nucleated RBC: 0.7 /100 WBC — ABNORMAL HIGH (ref 0.0–0.0)
Platelets: 293 10*3/uL (ref 142–346)
RBC: 3.79 10*6/uL — ABNORMAL LOW (ref 3.90–5.10)
RDW: 24 % — ABNORMAL HIGH (ref 11–15)
WBC: 5.99 10*3/uL (ref 3.10–9.50)

## 2020-02-03 LAB — CYCLIC CITRUL PEPTIDE ANTIBODY, IGG: Cyclic Citrullinated Peptide AB: <15.6 U

## 2020-02-03 LAB — ALDOLASE: Aldolase: 7.5 U/L (ref <7.7)

## 2020-02-03 LAB — PATHOLOGY REVIEW-ANA

## 2020-02-03 LAB — URIC ACID: Uric acid: 8.7 mg/dL — ABNORMAL HIGH (ref 2.6–6.0)

## 2020-02-03 LAB — GFR: EGFR: 53

## 2020-02-03 LAB — MAGNESIUM: Magnesium: 1.7 mg/dL (ref 1.6–2.6)

## 2020-02-03 LAB — BETA HCG, TUMOR, QUANTITATIVE: TUMOR MARKER BHCG QUANT: 1.6

## 2020-02-03 MED ORDER — SPIRONOLACTONE 25 MG PO TABS
25.0000 mg | ORAL_TABLET | Freq: Two times a day (BID) | ORAL | 0 refills | Status: AC
Start: 2020-02-03 — End: ?

## 2020-02-03 MED ORDER — ALLOPURINOL 100 MG PO TABS
100.0000 mg | ORAL_TABLET | Freq: Every day | ORAL | Status: DC
Start: 2020-02-04 — End: 2020-02-04
  Administered 2020-02-04: 100 mg via ORAL
  Filled 2020-02-03: qty 1

## 2020-02-03 MED ORDER — FUROSEMIDE 40 MG PO TABS
40.0000 mg | ORAL_TABLET | Freq: Two times a day (BID) | ORAL | 0 refills | Status: AC
Start: 2020-02-03 — End: ?

## 2020-02-03 MED ORDER — UPTRAVI 1600 MCG PO TABS
1600.0000 ug | ORAL_TABLET | Freq: Two times a day (BID) | ORAL | Status: AC
Start: 2020-02-03 — End: ?

## 2020-02-03 MED ORDER — FUROSEMIDE 40 MG PO TABS
40.0000 mg | ORAL_TABLET | Freq: Two times a day (BID) | ORAL | Status: DC
Start: 2020-02-04 — End: 2020-02-04
  Filled 2020-02-03: qty 1

## 2020-02-03 MED ORDER — FUROSEMIDE 10 MG/ML IJ SOLN
40.0000 mg | Freq: Every day | INTRAMUSCULAR | Status: DC
Start: 2020-02-04 — End: 2020-02-03

## 2020-02-03 MED ORDER — ALLOPURINOL 100 MG PO TABS
100.0000 mg | ORAL_TABLET | Freq: Every day | ORAL | 0 refills | Status: DC
Start: 2020-02-03 — End: 2020-03-07

## 2020-02-03 NOTE — Progress Notes (Signed)
IMG Arrhythmia Follow Up    HPI:   [name] a 74 y.o.femalewho recently moved from [place] to [place], prior care at [place], hx of PAF s/p ablation at [facility], antisynthetase syndrome, HFpEF and hypertrophic cardiomyopathy,COPD, OSA on home O2, PAH, CTD vs ILD, COVID PNA 2021, Jehova's Witness, HTN, obesity, s/p AVN ablation and single chamber Medtr[employer] pacemaker (with LBB lead) in October 2020 who presented as direct admit by transplant team 02/01/20 for decreased cardiac index and dyspnea on exertion.    Patient has established care with Dr. Brooke Dare in the advanced lung clinic. She had a RHC 01/24/20 which showed severe pulmonary HTN and severe reduced CO/CI (Fick CO/CI 1.9/1.0).     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  TTE 12/28 EF 50%     24 hour events:  Telemetry: SR, VP, 3 beats NSVT    Today she is doing well.  She notes mild DOE this morning after being active. She denies chest pain, shortness of breath at rest, edema, palpitations, presyncope, or syncope.     Assessment and Plan:  1. PAF, s/p AV nodal ablation and single chamber PM in October 2020:  a. Now in NSR, VVI pacing  b. Yesterday pacemaker lower rate was increased to 80 bpm to assist with cardiac output  c. She was evaluated by AHF team - per team she is declining heart/lung transplant.    d. Given her RV failure and that she is back in sinus (with AV dyssynchrony) she would potentially benefit from addition of atrial lead.  She is currently IV diuresing.  Recommend that we do this outpatient.  She is a Public librarian witness and on Eliquis.  We will plan to hold Eliquis for 2 days prior.    e. Our scheduler will call her arrange.   f. Will sign off but feel free to consult [name] should any further EP issues arise.     Relevant Cardiac Diagnostics:  ECG (most recent): 02/02/20  SR, VVI pacing     I have independently reviewed and interpreted these ECGs.    TTE(most recent): 02/02/20  EF  50%, severe pulmonary HTN     Relevant Labs:  Lab Results   Component Value Date    WBC 5.99 02/03/2020    HGB 12.4 02/03/2020    PLT 293 02/03/2020    NA 138 02/03/2020    K 4.3 02/03/2020    MG 1.7 02/03/2020    CREAT 1.2 (H) 02/03/2020    AST 14 02/03/2020    ALT 9 02/03/2020    TSH 2.25 02/01/2020    INR 1.2 (H) 02/01/2020    BNP 92 02/01/2020     I have personally reviewed all recent relevant lab results.     Medications:  Current Facility-Administered Medications   Medication Dose Route Frequency Provider Last Rate Last Admin    acetaminophen (TYLENOL) tablet 650 mg  650 mg Oral Q6H PRN Sofie Hartigan, MD        Or    acetaminophen (TYLENOL) suppository 650 mg  650 mg Rectal Q6H PRN Sofie Hartigan, MD        allopurinol (ZYLOPRIM) tablet 50 mg  50 mg Oral Daily Sofie Hartigan, MD   50 mg at 02/03/20 0956    apixaban (ELIQUIS) tablet 5 mg  5 mg Oral Q12H [facility] Sofie Hartigan, MD   5 mg at 02/02/20 0808    atorvastatin (LIPITOR)  tablet 80 mg  80 mg Oral QHS Sofie Hartigan, MD   80 mg at 02/02/20 2100    colchicine tablet 0.6 mg  0.6 mg Oral Daily Sofie Hartigan, MD   0.6 mg at 02/03/20 0955    dextrose (GLUCOSE) 40 % oral gel 15 g of glucose  15 g of glucose Oral PRN Sofie Hartigan, MD        And    dextrose 50 % bolus 12.5 g  12.5 g Intravenous PRN Sofie Hartigan, MD        And    glucagon (rDNA) (GLUCAGEN) injection 1 mg  1 mg Intramuscular PRN Sofie Hartigan, MD        ezetimibe (ZETIA) tablet 10 mg  10 mg Oral QHS Sofie Hartigan, MD   10 mg at 02/02/20 2100    ferrous sulfate EC tablet 324 mg  324 mg Oral Q48H Sofie Hartigan, MD   324 mg at 02/01/20 2109    furosemide (LASIX) injection 20 mg  20 mg Intravenous BID Leanne Lovely, MD   20 mg at 02/03/20 1610    levothyroxine (SYNTHROID) tablet 50 mcg  50 mcg Oral Daily at 0600 Sofie Hartigan, MD   50 mcg at 02/03/20 9604    melatonin tablet 3 mg  3 mg Oral QHS PRN Sofie Hartigan, MD        naloxone [facility]) injection 0.2 mg  0.2 mg Intravenous PRN Sofie Hartigan, MD        selexipag (UPTRAVI) tablet 1,600 mcg  1,600 mcg Oral Q12H [facility] Sofie Hartigan, MD   1,600 mcg at 02/02/20 2100    spironolactone (ALDACTONE) tablet 25 mg  25 mg Oral BID Leanne Lovely, MD   25 mg at 02/03/20 5409    vitamin D (cholecalciferol) tablet 50 mcg  50 mcg Oral Daily Sofie Hartigan, MD   50 mcg at 02/03/20 8119          Physical Examination  Vital Signs: BP 105/70    Pulse 80    Temp 97.7 F (36.5 C) (Oral)    Resp 16    Ht 1.575 m (5' 2.01")    Wt 83 kg (182 lb 14.4 oz)    SpO2 96%    BMI 33.44 kg/m      General Appearance:  Alert, cooperative, no distress, appears stated age   Head:  Normocephalic, without obvious abnormality, atraumatic   Neck: Supple, symmetrical, trachea midline, no adenopathy, thyroid: not enlarged, symmetric, no tenderness/mass/nodules, no carotid bruit or JVD   Lungs:   Breathing comfortably without accessory muscle use.  Lungs clear to auscultation bilaterally without crackles or wheezes.  Normal respiratory effort.    Chest Wall:  No tenderness or deformity   Heart:  Regular rate and rhythm, S1, S2 normal, no murmur, rub or gallop, JVP is normal without hepatojugular reflex   Abdomen:   Soft, non-tender, bowel sounds active all four quadrants,  no masses, no organomegaly   Extremities: Extremities normal, atraumatic, no cyanosis or edema   Pulses: 2+ and symmetric   Skin: Skin color, texture, turgor normal, no rashes or lesions   Neurologic: Alert and oriented x3, normal mood and affect.      ____________________   Malvin Johns, APRN  IMG Arrhythmia  Spectra link [phone]  Office : [phone]

## 2020-02-03 NOTE — Discharge Instr - AVS First Page (Addendum)
Dear [name],    You were admitted to [facility] on 02/01/2020. You underwent evaluation with imaging studies to figure out the etiology of your symptoms. You were seen by our advanced heart, advanced lung, rheumatology, electrophysiology and palliative teams. Several of your medications were adjusted and stopped. You are stable for discharge at this time.     - Please follow up with your primary care physician  - Please follow up with the advanced lung team. We recommend you follow up with Dr. Kyung Rudd for further follow up.  - Please follow up with the electrophysiologist. They are considering adding an additional lead to your pacemaker  - Please follow up with the rheumatologist.     It was our pleasure treating you here at [facility].    Sincerely,  Your [employer] Medical Team

## 2020-02-03 NOTE — Progress Notes (Signed)
S: Direct admission for decreased cardiac index and dyspnea on exertion.  B: Group 1 PAH, COPD on CPAP, heart failure, baseline O2 4-6 liters [place], PAF s/p ablation, myositis, Covid-19.  A: Alert, oriented x3, minimal assist with ADL's, assist with ambulation. Lives alone in a single level home. HHA 2-3 days a week. See below notes.  R: Home with home health.     02/03/20 1035   Patient Type   Within 30 Days of Previous Admission? No   Healthcare Decisions   Interviewed: Other (Comment)   Orientation/Decision Making Abilities of Patient Alert and Oriented x3, able to make decisions   Advance Directive Patient does not have advance directive   Prior to admission   Prior level of function Ambulates with assistive device; Independent with ADLs   Home Layout One level   Have running water, electricity, heat, etc? Yes   Living Arrangements Alone   How do you get to your MD appointments? Self   How do you get your groceries? Self   Who fixes your meals? Self   Who does your laundry? Self   Who picks up your prescriptions? 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 Yes   Important Message from [facility] Notice   Patient received 1st IMM Letter? Yes     [name]

## 2020-02-03 NOTE — Progress Notes (Signed)
[facility] Palliative Medicine & Comprehensive Care   Service Phone Number: FX: [phone], Mon-Fri 9a-4p / Xtend Pager: #[phone] (24/7)     Palliative Care Progress Note   Date Time: 02/03/20 12:19 PM   Patient Name: [name], [name]   Location: [id number]   Attending Physician: Carloyn Manner, MD   Primary Care Physician: Georgeanna Lea, MD   Consulting Provider: Namon Cirri, MD   Consulting Service: Palliative Medicine and Comprehensive Care  Consulted request from Carloyn Manner, MD to see patient regarding:   Reason for Referral: Psychosocial or spiritual support; Clarify goals of care          Assessment & Plan   Impression   [name] 74 y.o. female with PAH on 4-6L home O2, PAF s/p ablation, dCHF and hypertrophic cardiomyopathy and question of ILD as well as reports if auto-immune lung condition who was admitted after RHC revealed decreased CI and worsening DOE.        Estimated Prognosis: Unclear         Recommendations   1. Goals of Care:   - "Optimistic but realistic"  - Values planning, has already done much of her end of life planning  - Interested in information about hospice, but not interested in pursuing at this time  - Jehovah's witness - no blood products, document scanned into record  - Wants to update her mPOA  - interested in outpatient home palliative care  - pt has declined transplant        Code Status:       - Full Code      Advance Care Planning:  ACP Validation: Advance care planning discussion initialized and Copy of existing advance care planning documents requested  Medical Decision Maker: Next of Kin status: daughter: Claude Manges [phone]  ACP Document: None      2. Psychosocial: palliative medicine LCSW following  - pt would like assistance in end of life planning, and communicating this with her daughters    3. Spiritual:   - Religion: Jehovah's Witness.   - Does not accept blood, plasma or platelet transfusions. Document scanned into  record  - Palliative Medicine Chaplain Adela Glimpse, [facility] available for support     5. PC Team follow-up plans: tomorrow      Discharge Disposition: Home      Outpatient Follow Up Recommended: Yes    Outcomes: Clarified goals of care, Counseled regarding hospice, Linked to palliative longitudinal support and ACP counseling assistance  25 minutes.  Total time on unit today in care of [facility]   including chart review, face to face evaluation and management. >50% of time spent in counseling & coordination of care with Patient with recommendations above.       Start Time: 1215                  Stop Time:   1240      Namon Cirri, MD, MD  Palliative Medicine & Comprehensive Care  Phone Number: [phone], Mon-Fri 9a-4p   Xtend Pager: [phone] (24/7)     Interval History   [name] is a 74 y.o. female admitted to hospital on 02/01/2020 with Pulmonary hypertension [I27.20]  SOB (shortness of breath) [R06.02]     Met with patient at bedside. She is feeling well.  Feels that her breathing is back to baseline, if not better and has been on her home level of O2. Anticipates  home soon, has been ambulating within room.    Reports that she had a good visit with LCSW earlier, she had some questions about hospice just for planning purposes to understand how it works but does not feel that she wants to enroll now. We also discussed home based palliative care in order to continue the conversation and help with getting her documents in order. Additionally they could be a resource down the line should she need more information about hospice services. She is very interested in getting connected with this.        Goals of Care   CURRENT CPR Status: Full Code       Advanced Care Planning:      Decisional Capacity: yes      Advance Directives have been completed in the past: yes      Advance Directives are available in chart: no      Discussed this admission: yes      ACP note completed: no        Date:          Outcome of Discussion: treatment/curative pathway        Palliative Functional and Symptom Assessment     Palliative Performance Scale: 70% - Reduced ambulation, unable to do normal work, some evidence of disease, full self-care, normal or reduced intake, full LOC    [place] Symptom Assessment Scale (ESAS): Completed  Anxiety: 0  Depression: 0  Drowsiness: 0  Lack of Appetite: 0  Nausea: 0  Pain: 0  Shortness of Breath: 2  Tiredness: 1  Well-being: 6     Medications   Scheduled Meds  Current Facility-Administered Medications   Medication Dose Route Frequency    allopurinol  50 mg Oral Daily    apixaban  5 mg Oral Q12H [facility]    atorvastatin  80 mg Oral QHS    colchicine  0.6 mg Oral Daily    ezetimibe  10 mg Oral QHS    ferrous sulfate  324 mg Oral Q48H    furosemide  20 mg Intravenous BID    levothyroxine  50 mcg Oral Daily at 0600    selexipag  1,600 mcg Oral Q12H [facility]    spironolactone  25 mg Oral BID    vitamin D  50 mcg Oral Daily      DRIPS     PRN MEDS  Current Facility-Administered Medications   Medication Dose    acetaminophen  650 mg    Or    acetaminophen  650 mg    dextrose  15 g of glucose    And    dextrose  12.5 g    And    glucagon (rDNA)  1 mg    melatonin  3 mg    naloxone  0.2 mg       Allergies   Allergies   Allergen Reactions    Amiodarone Other (See Comments)     Lung toxicity    Cellcept [Mycophenolate]      Gastro hemorrhages     Zyban [Bupropion] Itching       Physical Exam   BP 106/57    Pulse 82    Temp 97.7 F (36.5 C) (Oral)    Resp 16    Ht 1.575 m (5' 2.01")    Wt 83 kg (182 lb 14.4 oz)    SpO2 95%    BMI 33.44 kg/m    Physical Exam:  General: well developed woman sitting up in chair  in NAD   HEENT:  EOMI, anicteric, OP/OC clear without lesions or thrush, MMM  Neck: supple   CV: irreg rate, no murmur  Lungs:unlabored, CTAB   Abd: soft, NT, ND, NABS, no rebound or guarding  Ext: trace edema of BLE  Neuro: awake, alert, oriented x 3, no focal  deficits  Psych:  appropriate insight and judgement, mood and affect congruent with current condition  Skin: no rashes or lesions noted    Labs / Radiology   Lab and diagnostics: reviewed in Epic  Recent Labs   Lab 02/03/20  0540   WBC 5.99   Hgb 12.4   Hematocrit 37.4   Platelets 293       Recent Labs   Lab 02/01/20  1943   PT 14.0*   PT INR 1.2*        Recent Labs   Lab 02/03/20  0540   Sodium 138   Potassium 4.3   Chloride 111   CO2 17*   BUN 18.0   Creatinine 1.2*   EGFR 53.0   Glucose 88   Calcium 8.9     Recent Labs   Lab 02/03/20  0540 02/02/20  0042 02/01/20  1809   Bilirubin, Total 0.5  More results in Results Review 0.6   Bilirubin Direct  --   --  0.2   Protein, Total 6.5  More results in Results Review 6.9   Albumin 3.6  More results in Results Review 4.0   ALT 9  More results in Results Review 9   AST (SGOT) 14  More results in Results Review 15   More results in Results Review = values in this interval not displayed.          XR Chest 2 Views    Result Date: 02/02/2020  1. Mild basilar atelectasis. 2. Interval increased prominence of cardiac silhouette, technical or consider the possibility of pericardial effusion. Correlate with echocardiogram. 3. Discussed with RN [place] at 8: 32 AM on 02/02/2020. Clide Cliff, MD  02/02/2020 8:34 AM    CT Angiogram Chest    Result Date: 02/03/2020   1. No evidence of acute or chronic pulmonary embolism. 2. Dilated pulmonary arteries consistent with pulmonary hypertension. Mosaic perfusion throughout the lungs is consistent with pulmonary hypertension. Carlynn Spry, MD  02/03/2020 11:01 AM    CT Chest High Resolution    Result Date: 02/02/2020   CT of the chest with high resolution imaging shows no evidence of significant pulmonary interstitial lung disease or pulmonary fibrosis; pulmonary groundglass opacity, seen on prior expiratory imaging, is no longer demonstrated. Cardiomegaly is unchanged. There is no definite pneumonia or CHF. The examination shows mildly  enlarged mediastinal lymph nodes which are grossly unchanged from prior study. Coronary artery calcifications are noted. Other findings as noted above. Miguel Dibble, MD  02/02/2020 4:45 AM

## 2020-02-03 NOTE — Progress Notes (Signed)
Palliative Medicine & Comprehensive Care  Service Phone Number: FX: [phone], Mon-Fri 9a-4p / [facility] Pager: [phone] (24/7)    Clinical Therapist - Initial Assessment     Date of Admission: 02/01/2020  Room/Bed: FI319/FI319-01    Situation: pt appeared alert and oriented at time of visit. She presents as engaged with appropriate eye contact and linear thinking. Affect/mood congruent. Pt received visits from Rheum, CM, and AHF team during Therapist visit.     Background: Per Palliative Care consult [dated 02/02/20], "[name] 74 y.o. female with PAH on home O2, PAF s/p ablation, dCHF and hypertrophic cardiomyopathy and question of ILD as well as reports if auto-immune lung condition who was admitted after RHC revealed decreased CI and worsening DOE."    Assessment:   Palliative Clinical Therapist met with pt to introduce self and role. Therapist explored holistic impact of illness and existing methods of coping. Pt's current hopes identified. Validation and normalization of mood offered. Palliative Clinical Therapist provided medical crisis counseling with a particular focus on the following themes:  Control, adjustment to illness, and EOL.     The following psychosocial needs were identified:    - Family issues: Pt interested in information sharing with her daughters. She expressed benefit in having daughters receive an update on current POC, recommendations, and pt's thoughts about her own treatment. Pt described the ways in which family has communicated with each other when discussing pt's illness. Therapist discussed arranging family meeting [via Zoom] early next week.    - Psychoeducation: Pt requested information about hospice. Therapist provided basic education and anticipatory guidance on hospice services and hospice planning. She is aware that there are several hospice agencies that service her area ([place]). Therapist explained the option of a hospice informational visit should pt  feel the needs to learn more about the hospice philosophy and the specific services that they are able to provide.     Care Network:   Primary caregiver: self. Pt has a paid aide in the home 2-3x/week for 4 hours/day.    Support system: family and church community. Pt has 1 biological daughter [name]) and a niece that she raised that she considers her daughter. Both are involved in pt's care and ongoing support.    Living situation: lives alone in a 55+ community   Community resources: paid aide    Psychosocial Factors:   Grief/bereavement: no anticipatory grief noted. Pt is however, navigating through steps that are common to those who are experiencing grief that is associated with terminal illness and dying. For example, pt appears to have an awareness of and feelings of acceptance that her chronic illness is shortening her lifespan. She has been in the process of taking care of final arrangements (I.e. funeral planning, writing her own [name], previously completed will, etc). She is also able to manage and express the emotions resulting from the thoughts of her own death.    Depression/demoralization: Pt expressed normal feelings of depression as a result of physical and functional effects of chronic illness. Pt able to describe these episodes and notes that feelings of depression fluctuate and resolve with effective use of coping strategies. Pt denies any thoughts or feelings of self-harm.    Anxiety: feelings of worry noted given pt's inability to finalize EOL planning. "I feel incomplete."     Spiritual/Socio-Cultural Factors:   Spirituality/religion: Je[employer] Witness   Is this a resource?: Yes. Pt is active in her faith community. She reports having a will that supports  her medical wishes as they align with the [employer] faith.    Cultural/value systems: Pt greatly values her independence and her ability to have an "improved" quality of life. She described ways in which she has made adjustments  to her life to ensure that she is able to participate in enjoyable activities despite experiencing physical/functional impacts of her illness.     Advance Care Planning:   Code status: Full Code    Advance Directive: Patient has advance directive, copy not in chart. Pt has copies of documents at home. She was encouraged to have her daughters bring a copy to the hospital or e-mail a copy to Therapist. Pt was given a copy of the "Your Right to Decide" booklet.    Health care decision maker: Per Code of [place] 54.04-2984 for order of priority in absence of an advance directive, decisions defer to her daughter [name]) in situations in which the pt is unable to make decisions.     Interventions:  Evidence-Based Clinical Interventions provided during this admission? Yes - Brief solution-focused therapy, Psychological education and Advance care planning   Interventions conducted during today's session:, Identification of Coping Skills, Exploration of Interpersonal Relationships/Communication, Psychoeducation, Advance Care Planning, Rapport Building, Reflective Listening, Anticipatory Guidance, Medical Crisis Counseling    Recommendations/Plan:   Therapist contacted Chaplain Office to determine if volunteers are available for spiritual care visit.    Per Chaplain, the JW volunteer does not visit in person but will contact the bedside RN (since pt is listed as JW in her chart) to determine if pt would like a telephonic visit.    Pt will contact her family to confirm if they are available for a family meeting next Tuesday   Once date/time of meeting is confirmed, Therapist will notify the treatment team and provide Zoom information     Raynald Kemp, LCSW, ACHP-SW, APHSW-C  Clinical Therapist   Palliative Medicine & Comprehensive Care  To reach Palliative MD/NP call [phone] Or Page [phone]   To reach Clinical Therapist call [phone]

## 2020-02-03 NOTE — Discharge Summary (Signed)
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 Micro / Labs / Path pending:   Unresulted Labs     Procedure . . . Date/Time    SM and SM/RNP Antibodies [[id number]] Collected: 02/01/20 1943     Updated: 02/01/20 2012        2. Wound Care Instructions: n/a  3. Date of completion for antibiotics or other medications: n/a  4. Other: Pt will require close follow-up with multiple specialists and coordination with PCP    Recent Labs:       Results     Procedure Component Value Units Date/Time    Cyclic citrul peptide antibody, IgG [[id number]] Collected: 02/01/20 1943    Specimen: Blood Updated: 02/03/20 1836     Cyclic Citrullinated Peptide AB, S <15.6 U     Reflex Extractable Nuclear Antigen Antibody [[id number]]  (Abnormal) Collected: 02/01/20 1943     Updated: 02/03/20 1505     Sjogrens SSA (Ro) Antibody 6 U/mL      Sjogrens SSA (RO) Antibody Interpretation Negative     Sjogrens SSB (La) Antibody 5 U/mL      Sjogrens SSB (La) Antibody Interpretation Negative     Smith (Sm) Antibody 4 U/mL      Smith (Sm) Antibody Interpretation Negative     Scleroderma (Scl-70) Antibody 9 U/mL      Scleroderma (Scl-70) Antibody Interpretation Negative     Jo-1 Antibody 7 U/mL      JO-1 Antibody Interpretation  Negative     Double Stranded DNA(dsDNA)Antibody 71 U/mL      Double Stranded DNA(dsDNA)Antibody Interpretation Negative     Centromere Antibody 3 U/mL      Centromere Antibody Interpretation Negative     Histone Antibody 1.2 U/mL      Histone Antibody Interpretation Weak Positive     Chromatin Antibody 6 U/mL      Chromatin Antibody Interp Negative     Ribosomal P Antibody 39 U/mL      Ribosomal P Antibody Interpretation Weak Positive     RNA Polymerase III Antibody 6 U/mL      RNA Polymerase III Antibody Interpretation Negative    Beta-HCG, Tumor Marker, Quantitative [[id number]] Collected: 02/01/20 2134     Updated: 02/03/20 1445     TUMOR MARKER BHCG QUANT 1.6    Aldolase [[id number]] Collected: 02/01/20 1943    Specimen: Blood Updated: 02/03/20 1137     Aldolase 7.5 U/L     Magnesium [[id number]] Collected: 02/03/20 0540    Specimen: Blood Updated: 02/03/20 0716     Magnesium 1.7 mg/dL     Uric acid [[id number]]  (Abnormal) Collected:  02/03/20 0540    Specimen: Blood Updated: 02/03/20 0716     Uric acid 8.7 mg/dL     GFR [[id number]] Collected: 02/03/20 0540     Updated: 02/03/20 0716     EGFR 53.0    Comprehensive metabolic panel [[id number]]  (Abnormal) Collected: 02/03/20 0540    Specimen: Blood Updated: 02/03/20 0716     Glucose 88 mg/dL      BUN 78.2 mg/dL      Creatinine 1.2 mg/dL      Sodium 956 mEq/L      Potassium 4.3 mEq/L      Chloride 111 mEq/L      CO2 17 mEq/L      Calcium 8.9 mg/dL      Protein, Total 6.5 g/dL      Albumin 3.6 g/dL      AST (SGOT) 14 U/L      ALT 9 U/L      Alkaline Phosphatase 111 U/L      Bilirubin, Total 0.5 mg/dL      Globulin 2.9 g/dL      Albumin/Globulin Ratio 1.2     Anion Gap 10.0    CBC without differential [[id number]]  (Abnormal) Collected: 02/03/20 0540    Specimen: Blood Updated: 02/03/20 0633     WBC 5.99 x10 3/uL      Hgb 12.4 g/dL      Hematocrit 46.9 %      Platelets 293 x10 3/uL      RBC 3.79 x10 6/uL      MCV 98.7 fL      MCH 32.7 pg      MCHC 33.2 g/dL      RDW 24 %       MPV 9.8 fL      Nucleated RBC 0.7 /100 WBC      Absolute NRBC 0.04 x10 3/uL     Pathology Review-ANA [[id number]] Collected: 02/01/20 1943     Updated: 02/03/20 0045     Pathology Review-ANA See Note    ANA IFA w reflex to Titer/Pattern/Antibody [[id number]]  (Abnormal) Collected: 02/01/20 1943     Updated: 02/03/20 0037     ANA Screen Positive     ANA Titer 1:640     ANA Pattern Speckled    Ribonucleoprotein(RNP)Antibody [[id number]] Collected: 02/01/20 1943     Updated: 02/02/20 1518     Ribonucleoprotein (RNP) Antibody 6 U/mL      Ribonucleoprotein (RNP) Antibody Interpretation Negative    Scleroderma (SCL-70) Antibody [[id number]] Collected: 02/01/20 1943     Updated: 02/02/20 1518     Scleroderma (Scl-70) Antibody 11 U/mL      Scleroderma (Scl-70) Antibody Interpretation Negative    Double Stranded DNA (dsDNA) Antibody [[id number]] Collected: 02/01/20 1943    Specimen: Blood Updated: 02/02/20 1518     Double Stranded DNA(dsDNA)Antibody 73 U/mL      Double Stranded DNA(dsDNA)Antibody Interpretation Negative    Troponin I [[id number]] Collected: 02/02/20 0042    Specimen: Blood Updated: 02/02/20 0128     Troponin I 0.05 ng/mL     GFR [[id number]] Collected: 02/02/20 0042     Updated: 02/02/20 0114     EGFR >60.0    Comprehensive metabolic panel [[id number]]  (Abnormal) Collected: 02/02/20 0042    Specimen: Blood Updated: 02/02/20 0114     Glucose 96 mg/dL      BUN 25.4 mg/dL      Creatinine 1.0 mg/dL      Sodium 270 mEq/L  Potassium 3.9 mEq/L      Chloride 114 mEq/L      CO2 16 mEq/L      Calcium 9.0 mg/dL      Protein, Total 6.5 g/dL      Albumin 3.6 g/dL      AST (SGOT) 14 U/L      ALT 9 U/L      Alkaline Phosphatase 107 U/L      Bilirubin, Total 0.5 mg/dL      Globulin 2.9 g/dL      Albumin/Globulin Ratio 1.2     Anion Gap 11.0    Magnesium [[id number]] Collected: 02/02/20 0042    Specimen: Blood Updated: 02/02/20 0114     Magnesium 1.7 mg/dL     CBC without differential [[id number]]  (Abnormal) Collected:  02/02/20 0042    Specimen: Blood Updated: 02/02/20 0111     WBC 6.42 x10 3/uL      Hgb 12.2 g/dL      Hematocrit 78.2 %      Platelets 280 x10 3/uL      RBC 3.81 x10 6/uL      MCV 98.4 fL      MCH 32.0 pg      MCHC 32.5 g/dL      RDW 24 %      MPV 9.6 fL      Nucleated RBC 0.8 /100 WBC      Absolute NRBC 0.05 x10 3/uL     HIV-1/2 Ag/Ab 4th Gen. w/ Reflex [[id number]] Collected: 02/01/20 1943    Specimen: Blood Updated: 02/02/20 0027     HIV Ag/Ab, 4th Generation Non-Reactive    Rheumatoid factor [[id number]] Collected: 02/01/20 1943    Specimen: Blood Updated: 02/01/20 2358     Rheumatoid Factor <13.0    Troponin I [[id number]] Collected: 02/01/20 2134    Specimen: Blood Updated: 02/01/20 2205     Troponin I 0.05 ng/mL     TSH [[id number]] Collected: 02/01/20 1943    Specimen: Blood Updated: 02/01/20 2058     TSH 2.25 uIU/mL     T4, free [[id number]] Collected: 02/01/20 1943    Specimen: Blood Updated: 02/01/20 2058     T4 Free 0.87 ng/dL     CBC and differential [[id number]]  (Abnormal) Collected: 02/01/20 1943    Specimen: Blood Updated: 02/01/20 2056     WBC 5.55 x10 3/uL      Hgb 11.9 g/dL      Hematocrit 43.3 %      Platelets 307 x10 3/uL      RBC 3.75 x10 6/uL      MCV 97.3 fL      MCH 31.7 pg      MCHC 32.6 g/dL      RDW 24 %      MPV 10.1 fL      Neutrophils 69.8 %      Lymphocytes Automated 18.0 %      Monocytes 9.7 %      Eosinophils Automated 1.1 %      Basophils Automated 0.7 %      Immature Granulocytes 0.7 %      Nucleated RBC 0.9 /100 WBC      Neutrophils Absolute 3.87 x10 3/uL      Lymphocytes Absolute Automated 1.00 x10 3/uL      Monocytes Absolute Automated 0.54 x10 3/uL      Eosinophils Absolute Automated 0.06 x10 3/uL      Basophils Absolute Automated 0.04 x10 3/uL  Immature Granulocytes Absolute 0.04 x10 3/uL      Absolute NRBC 0.05 x10 3/uL     Cell MorpHology [[id number]]  (Abnormal) Collected: 02/01/20 1943     Updated: 02/01/20 2056     Cell Morphology Abnormal     Platelet Estimate Normal      Ovalocytes Present     Spherocytes 1+     Dual RBC Population Present     Giant Platelets Present    B-type Natriuretic Peptide [[id number]] Collected: 02/01/20 1943    Specimen: Blood Updated: 02/01/20 2038     B-Natriuretic Peptide 92 pg/mL     Creatine Kinase (CK) [[id number]] Collected: 02/01/20 1943    Specimen: Blood Updated: 02/01/20 2035     Creatine Kinase (CK) 84 U/L     Prothrombin time/INR [[id number]]  (Abnormal) Collected: 02/01/20 1943    Specimen: Blood Updated: 02/01/20 2030     PT 14.0 sec      PT INR 1.2    SM and SM/RNP Antibodies [[id number]] Collected: 02/01/20 1943     Updated: 02/01/20 2012    Basic Metabolic Panel [[id number]]  (Abnormal) Collected: 02/01/20 1809    Specimen: Blood Updated: 02/01/20 1851     Glucose 92 mg/dL      BUN 62.8 mg/dL      Creatinine 1.2 mg/dL      Calcium 9.3 mg/dL      Sodium 315 mEq/L      Potassium 4.1 mEq/L      Chloride 111 mEq/L      CO2 19 mEq/L      Anion Gap 10.0    Hepatic function panel (LFT) [[id number]]  (Abnormal) Collected: 02/01/20 1809    Specimen: Blood Updated: 02/01/20 1851     Bilirubin, Total 0.6 mg/dL      Bilirubin Direct 0.2 mg/dL      Bilirubin Indirect 0.4 mg/dL      AST (SGOT) 15 U/L      ALT 9 U/L      Alkaline Phosphatase 114 U/L      Protein, Total 6.9 g/dL      Albumin 4.0 g/dL      Globulin 2.9 g/dL      Albumin/Globulin Ratio 1.4    GFR [[id number]] Collected: 02/01/20 1809     Updated: 02/01/20 1851     EGFR 53.0          Procedures/Radiology performed:   Radiology: all results from this admission  XR Chest 2 Views    Result Date: 02/02/2020  1. Mild basilar atelectasis. 2. Interval increased prominence of cardiac silhouette, technical or consider the possibility of pericardial effusion. Correlate with echocardiogram. 3. Discussed with RN [place] at 8: 32 AM on 02/02/2020. Clide Cliff, MD  02/02/2020 8:34 AM    CT Angiogram Chest    Result Date: 02/03/2020   1. No evidence of acute or chronic pulmonary embolism. 2. Dilated pulmonary  arteries consistent with pulmonary hypertension. Mosaic perfusion throughout the lungs is consistent with pulmonary hypertension. Carlynn Spry, MD  02/03/2020 11:01 AM    CT Chest High Resolution    Result Date: 02/02/2020   CT of the chest with high resolution imaging shows no evidence of significant pulmonary interstitial lung disease or pulmonary fibrosis; pulmonary groundglass opacity, seen on prior expiratory imaging, is no longer demonstrated. Cardiomegaly is unchanged. There is no definite pneumonia or CHF. The examination shows mildly enlarged mediastinal lymph nodes which are grossly unchanged from prior study. Coronary artery calcifications are noted.  Other findings as noted above. Miguel Dibble, MD  02/02/2020 4:45 AM    Surgery: all results from this admission  * No surgery found *     Hospital Course:     Reason for admission/ HPI:   [name] is a 74 y.o. 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 In the past has had clear CTs, normal EF. 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     She was referred to Dr. Elmer Bales group (recently moved to this area from [facility]) who subsequently advised her to present to [facility] for further care.     She endorses progressive SOB and DOE since COVID hospitalization March 2021. Some mild orthopnea. Mild LE edema. O/w She denies chest pain. No recent fevers, cough/congestion, abd pain, N/V/D, no other questions/cocnerns.     Hospital Course:   [name] presents with severe pulmonary hypertension, apical hypertrophic cardiomyopathy and hx of HFpEF, questionable prior suspicion of myositis and ILD, and underwent further evaluation in consultation with advanced  lung disease, advanced heart failure, electrophysiology, and rheumatology to determine etiology of pulmonary hypertension and heart failure. Regarding severe pulmonary HTN with prior diagnosis of PAH, group 1, CT did not currently show evidence of ILD but cardiomegaly and enlarged mediastinal LNs were present. Further workup will need to better determine WHO group. ALD followed patient, recommended to continue Uptravi, discontinue Opsumit, and performed dual energy CTA to eval for CTEPH. Palliative care consult was requested by pt, and she is declining consideration of heart/lung transplant for now and should follow up with Palliative Care in outpatient setting. Per chart, pt is Jehova's Witness, and her preferences should be considered prior to interventions. 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 Advanced heart failure was consulted and recommended diuresis, avoiding BB, considering CCB in future, and to continue [facility] regardless of CHA2DS2Vasc due to patient's risk of clotting. We also continued home Atorvastatin 80mg  qd, Zetia 10mg  qd, as well as Lasix 20mg  BID and Aldactone 25mg  BID. Regarding Hx pAfb s/p ablation, pt has Medtronic single chamber pacemaker in place but current HR may be contributing to fatigue/dypsnea on exertion. EP was consulted and increased pacemaker lower rate to 80bpm for CO. She may benefit from atrial lead, may be a candidate for tx as outpatient, for which she would need to hold eliquis 2d prior. 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 She does have strong family hx of autoimmune dz and prior diagnosis of anti-synthetase syndrome, with records available in Care Everywhere. ANA+, DSDNA-, Rh-, RNP-, Scl-, awaiting further results upon discharge. CK in 80s thus myositis unlikely. HIV-. TSH/T4 WNL on  synthroid. Rheumatology was consulted and recommended stopping azathioprine and increasing allopurinol dose to 100mg . Hx OSA is likely contributing to pulmonary condition. She uses her CPAP perhaps every other night but has an appointment with a sleep physician in November. She had CPAP ordered while inpatient. Other home meds were continued, including colchicine for Gout, Synthroid for hypothryoidism. She has a history of CKD, but Cr level was 1.0 during inpatient stay.  Discharge Day Exam:  Temp:  [97.5 F (36.4 C)-97.7 F (36.5 C)] 97.5 F (36.4 C)  Heart Rate:  [78-83] 80  Resp Rate:  [15-16] 16  BP: (91-123)/(57-76) 123/76    General: comfortable, NAD  HEENT: NCAT, no icterus, no proptosis, EOMI, dorsocervical fat pad  Cardiovascular: regular rate and rhythm, no MRG, radial and DP pulses 2+  Lungs: CTAB, comfortable WOB with NC in place on O2 Flow Rate (L/min): 6 L/min    Abdomen: non-distended, soft, non-tender  Extremities: puffy nonpitting edema over bilateral ankles L>R, trace nonpitting edema over hands  Derm: No lesions over exposed skin  Psych: normal judgment and excellent insight    Wounds/decutibus ulcers/stage: none    Consultations:     Treatment Team:   Attending Provider: Carloyn Manner, MD  Consulting Physician: Carloyn Manner, MD  Resident: Nash Mantis, Sherlyn Lees, MD  Resident: Zandra Abts, MD  Resident: Leanne Lovely, MD    Discharge Condition:     Stable    Discharge Instructions & Follow Up Plan for Patient:     Diet: Regular diet    Activity/Weight Bearing Status: usual activity as tolerated      Patient was instructed to follow up with:      Follow-up Information     Sherner, Humberto Leep III, MD. Schedule an appointment as soon as possible for a visit in 1 week(s).    Specialties: Critical Care Medicine, Internal Medicine, Pulmonary Disease  Contact information:  [address]  Heart and Lung Transplant  [place] [address]  [phone]             Scherrie Merritts, MD.  Schedule an appointment as soon as possible for a visit in 1 week(s).    Specialties: Advanced Heart Failure and Transplant Cardiology, Cardiology  Contact information:  [address]  [facility]  [place] [address]  [phone]             Jethro Bolus, MD. Schedule an appointment as soon as possible for a visit in 1 month(s).    Specialties: Rheumatology, Internal Medicine  Contact information:  [address]  [address]  [place] [address]  [phone]             Eliseo Gum, MD PhD. Schedule an appointment as soon as possible for a visit in 1 week(s).    Specialties: Clinical Cardiac Electrophysiology, Internal Medicine, Cardiology  Contact information:  [address]  [address] [place] [address]  [phone]                           Discharge Code Status: Full  Patient Emergency Contact: Claude Manges (Daughter) [phone] [facility] [place])    Complete instructions and follow up are in the patient's After Visit Summary    Minutes spent coordinating discharge and reviewing discharge plan: 30 minutes    Discharge Medications:        Discharge Medication List      Taking    allopurinol 100 MG tablet  Dose: 100 mg  What changed: how much to take  Commonly known as: ZYLOPRIM  Take 1 tablet (100 mg total) by mouth daily Per pt taking 50mg  (half tab)     apixaban 5 MG  Dose: 5 mg  Commonly known as: ELIQUIS  Take 1 tablet (5 mg total) by mouth every 12 (twelve) hours     atorvastatin 80  MG tablet  Dose: 80 mg  Commonly known as: LIPITOR  Take 80 mg by mouth daily     Colchicine 0.6 MG capsule  Dose: 0.6 mg  Take 0.6 mg by mouth daily     ezetimibe 10 MG tablet  Dose: 10 mg  Commonly known as: ZETIA  Take 10 mg by mouth daily     ferrous sulfate 325 (65 FE) MG tablet  Dose: 325 mg  Take 325 mg by mouth Once every Monday, Wednesday and Friday morning Taking on Monday wed, friday      furosemide 40 MG tablet  Dose: 40 mg  What changed:    when to take this   additional  instructions  Commonly known as: LASIX  Take 1 tablet (40 mg total) by mouth 2 (two) times daily     levothyroxine 100 MCG tablet  Dose: 50 mcg  Commonly known as: SYNTHROID  Take 50 mcg by mouth Once a day at 6:00am      OXYGEN-HELIUM IN  Inhale into the lungs     potassium chloride 10 MEQ tablet  Dose: 10 mEq  Commonly known as: K-DUR  Take 10 mEq by mouth     spironolactone 25 MG tablet  Dose: 25 mg  Commonly known as: ALDACTONE  Take 1 tablet (25 mg total) by mouth 2 (two) times daily     Uptravi 1600 MCG Tabs  Dose: 1,600 mcg  What changed: how much to take  Generic drug: Selexipag  Take 1,600 mcg by mouth 2 (two) times daily     vitamin D 25 MCG (1000 UT) tablet  Dose: 2,000 Units  Commonly known as: CHOLECALCIFEROL  Take 2,000 Units by mouth daily        STOP taking these medications    azaTHIOprine 50 MG tablet  Commonly known as: IMURAN     macitentan 10 MG tablet  Commonly known as: OPSUMIT            Immunizations provided: none        [employer] [facility]   Department of Medicine   P: [phone]   F: [phone]    Signed by: Zandra Abts, MD    CC: Sherner, Despina Arias, MD

## 2020-02-03 NOTE — Consults (Incomplete)
Attending note-   Patient seen and examined with Dr. Marchia Bond . I have reviewed the notes, assessments and/or procedures performed by the resident, and I concur with his documentation of [name]       74yo female with 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 Diagnosis of +ve SSA52/ myositis associated ILD, PAF s/p ablation, Hypertrtophic cardiomyopathy, HFrEF (w hypokinetic left side), on latest cardiac MRI, OSA- diag in 2014, gout. COVID in 06/2019 admitted for Treatment optimization.     Phtn  Severe. Getting optimized Treatment with ALD  HFrEF- adv HF to see patient    ILD-  Previously followed at [facility].   Was started on MMF in 08/2017- did not tolerate due to GI bleed.   Imuran- 04/2018- till date. 100mg /day initially-  On evaluation here- dose has been decreased to 50mg /day.      reviewed previous HRCT scan reports from 2019 and 2020.   Discussed with Dr. Leotis Shames.  Did not have classic changes suggestive of ILD per reports.  Negative for subpleural reticulation, traction bronchiectasis/bronchiolectasis, ground-glass, architectural distortion or honeycombing. Mild mosaic pulmonary parenchymal attenuation in the lower lobes with air trapping on expiratory phase imaging    Clinically no evidence of muscle weakness/ inflammatory arthralgia's/ sicca symptoms/ raynaud's.   Reviewed previous lab results. +ve ANA with SSA52- low positive on LC myositis panel. Also aldolase 8;9 with normal CPK from 2019.  Note of +ve RF but was only 14.1    In the absence of any symptoms, and no classic ILD changes- I would favour this being a false positive lab.     I agree with stopping azathiprine while optimi      No clinical concern for autoimmune disease  False positive previous labs    Discussed w Dr. Leotis Shames.   Plan to stop Imuran.             Jethro Bolus, MD  [facility]

## 2020-02-03 NOTE — Plan of Care (Signed)
A&Ox4. VSS. Denies pain at this time. NPO since MN for possible procedure.     Safety precautions maintained throughout shift. Bed in low position, call light within reach. Floor mat present, bed alarm activated. Hourly rounding done. Needs met at this time.     --diuresis with IV lasix  --daily weight, strict I&Os   --PT/OT  --poss atrial lead addition procedure          Problem: Moderate/High Fall Risk Score >5  Goal: Patient will remain free of falls  Outcome: Progressing     Problem: Inadequate Gas Exchange  Goal: Adequate oxygenation and improved ventilation  Outcome: Progressing     Problem: Impaired Mobility  Goal: Mobility/Activity is maintained at optimal level for patient  Outcome: Progressing     Problem: Hemodynamic Status: Cardiac  Goal: Stable vital signs and fluid balance  Outcome: Progressing     Problem: Ineffective Gas Exchange  Goal: Effective breathing pattern  Outcome: Progressing     Problem: Safety  Goal: Patient will be free from injury during hospitalization  Outcome: Progressing  Goal: Patient will be free from infection during hospitalization  Outcome: Progressing

## 2020-02-03 NOTE — Progress Notes (Signed)
Advanced Lung Disease Progress Note    24hr events: NAEO. Stable on 4L/min at rest.  EP recommends placing an atrial lead, but will do it as an outpatient.  HR increased to 80 VVI pacing.  Getting IV diuresis.     Assessment/Plan   74 y.o. 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  Very low index on recent RHC.  ECHO bedside read with relatively normal RV but decreased hypokinetic LC.  Cardiac MRI at [facility] with similar findings.  LHC at [facility] with non significant coronary disease.    PH Group: 2  WHO Functional class: 3-4  Reveal: 11  ESC/ERS Risk score: intermediate to high    Recommendations  PH  - HRCT without ILD or pulmonary fibrosis, with cardiomegaly and enlarged mediastinal lymph nodes  - HR adjusted by EP to increase CO, for atrial lead placement as an outpatient  - Discontinued opsumit 10 mg daily as it contributes to fluid retention and ERAs increased mortality in patients with HFrEF-PH  - Continue Selexipag 1600 mg bid; did not tolerate PDE5i/rio in the past  - Dual energy CTA to evaluate for CTEPH is negative for clots  - Currently on IV Lasix to 20mg  bid and Aldactone 25mg  bid   - On ECHO RA ~40mmHg, consider switching to PO lasix 40mg  BID   - Daily weights, I/O   - BNP 92 10/27  - 3D echocardiogram with biventricular dysfunction   - Advanced Heart Failure following -> diuresis per them      - Imuran 50 mg daily d/c'd by rheum   - ?Unclear if really has CTD    - Rheum saw the patient   - F/u ana, aldo, rnp, ssa, ssb, myositis panel, ds dna, scl-70, rf, ccp, myositis panel  - CK wnl, tsh wnl, hiv neg    - Patient requesting palliative consult, which is appropriate, medicine team will consult  - Should be able to d/c home from our standpoint    D/w medicine team, adv heart failure, bedside RN, patient    History of Present Illness  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   Patient had her initial care at [facility].     Summary of issues:   PH group 2: Diagnosed with PAH via RHC 05/2013, transferred to [name] on Uptravi and Opsumit. Previously was on Adempas and Sildenafil but did not tolerate.    Diastolic/systolic CHF: She also has diastolic heart failure with  Multiple echos showing severely dilated LA apical hypertrophic cardiomyopathy and preserved EF 65-70% as recently as 09/2019. 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 Started Imuran 04/2018, up to 100mg  Qhs.  She also has gout which is her major MSK complaints, is on allopurinol and colchicine.  HRCT at [facility] with no ILD.    OSA on CPAP: Diagnosed 2014 but PSG, does not believe she's seen a sleep doctor since that time, has not received a new machine since then. She says her pressure st 14cmH2O. She wears 6LNC while also wearing the CPAP mask, does not run the O2 through the CPAP itself. Has not had her compliance monitored.    Paroxysmal Afib s/p ablation and GIB: Previously was on eliquis though has had two episodes of GIB, most recently  in 11/2019. She was off Eliquis prior to the 8/21 event. She is Jehovah's witness so received IV Iron, was admitted at [facility]. Lowest Hgb was 9.1g/dL, increased to 16.1W/RU at time of discharge. She says she has been hypotensive since this admission. Her baseline Hgb prior to GIB was 14g/dL. She was not scoped during this admission.    COVID-19 pneumonia  - 06/2019 with week long hospitalization    1.4.1 Connective tissue diseases?  PH work-up Date Pertinent Results/Comments   CTD serologies Had at [facility] - repeat    LFTs 12/30/19    HIV 02/01/20    Tox screen needs    TFTs 02/01/20    PFTs 12/30/19    HRCT 09/16/19 Mild emphysema; mosaicism   CT angiogram pending    V/Q scan 06/2016    Sleep study/overnight oximetry needs    Echo 02/01/20    RHC 01/24/20     NO challenge  ND due to very low CI     Pregnancy Prevention Strategy for Females - postmenopausal    Preventive Health   Burman Nieves [name] was advised to follow up with PCP for age-appropriate vaccinations.  Testing Date Comments   Influenza     Pneumovax     Prevnar 13     Covid      Advanced Directive     Palliative Care  N/A       Past Medical History:   Diagnosis Date    A-fib     Cardiomyopathy     CKD (chronic kidney disease)     Congestive heart failure     COPD (chronic obstructive pulmonary disease)     COVID-19     Disorder of thyroid     Gastrointestinal hemorrhage     HTN (hypertension)     NSTEMI (non-ST elevated myocardial infarction)     During LHC    Obesity     OSA on CPAP     PNA (pneumonia)        Past Surgical History:   Procedure Laterality Date    CARDIAC ABLATION      A-FIB ABLATION    CESAREAN SECTION      INSERT / REPLACE / REMOVE PACEMAKER      RIGHT HEART CATH ONLY INCL. CO AND SATS Right 01/24/2020    Procedure: RIGHT HEART CATH w/ exercise;  Surgeon: Burna Forts, MD;  Location: [facility]ARDI[facility] CATH;  Service: Cardiovascular;  Laterality: Right;  same day discharge       Summary of Pulmonary Vascular Disease Risk Factors  History of DVT or PE: No  History of connective tissue disease: yes  History of heart failure:  yes  History of pulmonary edema: No  History of diastolic dysfunction: yes  History of valvular disease: No  History of blood dyscrasia: No  History of COPD: yes  History of interstitial lung disease: No  History of sleep disordered breathing: yes  History of prescription diet drug use: No  History of over the counter diet drug use: No  History of amphetamine or amphetamine derivative use: No  History of illicit drug use or other toxin exposure: No  History of HIV: No  History of miscarriage: No  History of blood transfusions: No    Current Medications  No current facility-administered medications on file prior to encounter.     Current  Outpatient Medications on File Prior to Encounter   Medication Sig Dispense Refill    allopurinol (ZYLOPRIM) 100 MG tablet  Take 50 mg by mouth daily Per pt taking 50mg  (half tab)         apixaban (ELIQUIS) 5 MG Take 1 tablet (5 mg total) by mouth every 12 (twelve) hours 60 tablet 11    atorvastatin (LIPITOR) 80 MG tablet Take 80 mg by mouth daily      azaTHIOprine (IMURAN) 50 MG tablet Take 50 mg by mouth daily         Colchicine 0.6 MG capsule Take 0.6 mg by mouth daily      ezetimibe (ZETIA) 10 MG tablet Take 10 mg by mouth daily      furosemide (LASIX) 40 MG tablet Take 40 mg by mouth daily Per pt she takes 40mg  and 20 mg alternating daily , took 20 mg on 01/16/2020        levothyroxine (SYNTHROID) 100 MCG tablet Take 50 mcg by mouth Once a day at 6:00am         macitentan (OPSUMIT) 10 MG tablet Take 10 mg by mouth daily      OXYGEN-HELIUM IN Inhale into the lungs      potassium chloride (K-DUR) 10 MEQ tablet Take 10 mEq by mouth      Selexipag (Uptravi) 1600 MCG Tab Take 1,600 mg by mouth 2 (two) times daily      vitamin D (CHOLECALCIFEROL) 25 MCG (1000 UT) tablet Take 2,000 Units by mouth daily      ferrous sulfate 325 (65 FE) MG tablet Take 325 mg by mouth Once every Monday, Wednesday and Friday morning Taking on Monday wed, friday            PH Medications Start date Current dose Stop Date Reason stopped   uptravi   1600 mg bid     opsumit  10 mg 02/02/20 Reduced EF                   Allergies   Allergen Reactions    Amiodarone Other (See Comments)     Lung toxicity    Cellcept [Mycophenolate]      Gastro hemorrhages     Zyban [Bupropion] Itching     Exam  BP 99/65    Pulse 82    Temp 97.7 F (36.5 C) (Oral)    Resp 16    Ht 1.575 m (5' 2.01")    Wt 83 kg (182 lb 14.4 oz)    SpO2 95%    BMI 33.44 kg/m   Estimated body mass index is 33.44 kg/m as calculated from the following:    Height as of this encounter: 1.575 m (5' 2.01").    Weight as of this encounter: 83 kg (182 lb 14.4  oz).    Weight Monitoring 02/01/2020 02/02/2020 02/03/2020   Height 157.5 cm - -   Height Method - - -   Weight 82.283 kg 82.6 kg 82.963 kg   Weight Method - Standing Scale Standing Scale   BMI (calculated) 33.2 kg/m2 - -     General: awake, alert, oriented x 3; no acute distress; 4 L NC  HEENT: pupils equal with EOMI; no thyromegaly, no cervical LN, no thrush  Cardiovascular: regular rate and rhythm, no murmurs  Lungs: clear to auscultation bilaterally, without wheezing, rhonchi, or rales  Abdomen: soft, non-tender, non-distended  Extremities: no clubbing, cyanosis, +trace LE edema  Neuro: normal sensory and motor systems and able to ambulate  Derm: no rashes  Musculoskeletal: nl ROM, no muscle weakness    Cumulative Data  Results  Procedure Component Value Units Date/Time    Reflex Extractable Nuclear Antigen Antibody [[id number]]  (Abnormal) Collected: 02/01/20 1943     Updated: 02/03/20 1505     Sjogrens SSA (Ro) Antibody 6 U/mL      Sjogrens SSA (RO) Antibody Interpretation Negative     Sjogrens SSB (La) Antibody 5 U/mL      Sjogrens SSB (La) Antibody Interpretation Negative     Smith (Sm) Antibody 4 U/mL      Smith (Sm) Antibody Interpretation Negative     Scleroderma (Scl-70) Antibody 9 U/mL      Scleroderma (Scl-70) Antibody Interpretation Negative     Jo-1 Antibody 7 U/mL      JO-1 Antibody Interpretation Negative     Double Stranded DNA(dsDNA)Antibody 71 U/mL      Double Stranded DNA(dsDNA)Antibody Interpretation Negative     Centromere Antibody 3 U/mL      Centromere Antibody Interpretation Negative     Histone Antibody 1.2 U/mL      Histone Antibody Interpretation Weak Positive     Chromatin Antibody 6 U/mL      Chromatin Antibody Interp Negative     Ribosomal P Antibody 39 U/mL      Ribosomal P Antibody Interpretation Weak Positive     RNA Polymerase III Antibody 6 U/mL      RNA Polymerase III Antibody Interpretation Negative    Beta-HCG, Tumor Marker, Quantitative [[id number]] Collected: 02/01/20 2134      Updated: 02/03/20 1445     TUMOR MARKER BHCG QUANT 1.6    Aldolase [[id number]] Collected: 02/01/20 1943    Specimen: Blood Updated: 02/03/20 1137     Aldolase 7.5 U/L     Magnesium [[id number]] Collected: 02/03/20 0540    Specimen: Blood Updated: 02/03/20 0716     Magnesium 1.7 mg/dL     Uric acid [[id number]]  (Abnormal) Collected: 02/03/20 0540    Specimen: Blood Updated: 02/03/20 0716     Uric acid 8.7 mg/dL     GFR [[id number]] Collected: 02/03/20 0540     Updated: 02/03/20 0716     EGFR 53.0    Comprehensive metabolic panel [[id number]]  (Abnormal) Collected: 02/03/20 0540    Specimen: Blood Updated: 02/03/20 0716     Glucose 88 mg/dL      BUN 74.2 mg/dL      Creatinine 1.2 mg/dL      Sodium 595 mEq/L      Potassium 4.3 mEq/L      Chloride 111 mEq/L      CO2 17 mEq/L      Calcium 8.9 mg/dL      Protein, Total 6.5 g/dL      Albumin 3.6 g/dL      AST (SGOT) 14 U/L      ALT 9 U/L      Alkaline Phosphatase 111 U/L      Bilirubin, Total 0.5 mg/dL      Globulin 2.9 g/dL      Albumin/Globulin Ratio 1.2     Anion Gap 10.0    CBC without differential [[id number]]  (Abnormal) Collected: 02/03/20 0540    Specimen: Blood Updated: 02/03/20 0633     WBC 5.99 x10 3/uL      Hgb 12.4 g/dL      Hematocrit 29.5 %      Platelets 293 x10 3/uL      RBC 3.79 x10 6/uL      MCV 98.7 fL      MCH 32.7 pg  MCHC 33.2 g/dL      RDW 24 %      MPV 9.8 fL      Nucleated RBC 0.7 /100 WBC      Absolute NRBC 0.04 x10 3/uL     Pathology Review-ANA [[id number]] Collected: 02/01/20 1943     Updated: 02/03/20 0045     Pathology Review-ANA See Note    ANA IFA w reflex to Titer/Pattern/Antibody [[id number]]  (Abnormal) Collected: 02/01/20 1943     Updated: 02/03/20 0037     ANA Screen Positive     ANA Titer 1:640     ANA Pattern Speckled    Ribonucleoprotein(RNP)Antibody [[id number]] Collected: 02/01/20 1943     Updated: 02/02/20 1518     Ribonucleoprotein (RNP) Antibody 6 U/mL      Ribonucleoprotein (RNP) Antibody Interpretation Negative     Scleroderma (SCL-70) Antibody [[id number]] Collected: 02/01/20 1943     Updated: 02/02/20 1518     Scleroderma (Scl-70) Antibody 11 U/mL      Scleroderma (Scl-70) Antibody Interpretation Negative    Double Stranded DNA (dsDNA) Antibody [[id number]] Collected: 02/01/20 1943    Specimen: Blood Updated: 02/02/20 1518     Double Stranded DNA(dsDNA)Antibody 73 U/mL      Double Stranded DNA(dsDNA)Antibody Interpretation Negative          ILD panel  Marker [place]   ANA Pattern     ANA titer 1:640    ANA screen (IFA)     Anti-DNA (DS) Ab     SSA  (Ro)Ab     SSB (La) Ab     RF 14 (<14)    Anti-CCP elevated    JO-1 Ab     SCL-70 Ab negative    Anti-centromere     Sm/RNP Ab 25    C-reactive Protein     BNP  92    Ro52 positive      HRCT 02/02/20  CT of the chest with high resolution imaging shows no evidence of significant pulmonary interstitial lung disease or pulmonary fibrosis; pulmonary groundglass opacity, seen on prior expiratory imaging, is no longer demonstrated. Cardiomegaly is unchanged. There is no definite pneumonia or CHF. The examination shows mildly enlarged mediastinal lymph nodes which are grossly unchanged from prior study. Coronary artery calcifications are noted.     09/16/19 HRCT chest: moderate mosaic attenuation throughout the lungs worsening on expiratory phase imaging. Findings may reflect air trapping, less likely small vessel disease.   Unchanged findings of pulmonary hypertension. Diffuse esophageal thickening, slightly worsened from prior exam.   Small hiatal hernia. Interval near complete resolution of previous multifocal patchy airspace disease.   Slight interval worsening of mediastinal lymphadenopathy. Scattered pulmonary nodules, unchanged from prior exam.     3D ECHO 02/02/20    * The left ventricle is normal in size.    * Left ventricular wall thickness is severely increased, particularly  towards the mid cavity and apex consistent with mid/apical  hypertrophic  cardiomyopathy. 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    * Left ventricular systolic function is mildly decreased with an ejection  fraction by Biplane Method of Discs of  50 %.    * Left ventricular diastolic filling parameters are consistent with Grade II  diastolic dysfunction (pseudonormal pattern) with elevated left atrial  pressure.    * There is diastolic mital regurgitation consistent with AV dissociation.    * The right ventricular cavity size is normal in size.    * Moderately-to-severely decreased right ventricular  systolic function  (TAPSE 1.0 cm, 3D RVEF 28% and FAC 24%).    * The left atrium is severely dilated.    * The right atrium is dilated.    * There is moderate tricuspid regurgitation.    * Severe pulmonary hypertension with estimated right ventricular systolic pressure of  87 mmHg.    09/20/2019 Cardiac MRI:  LVEF 35%, SV 34mL, LVEDV 98mL, LVESV 64mL  RVEF 40%, SV 48mL, RVEDV , EVESV 64mL  Impression:  1. 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 Would consider coronary disease with previous apical infarction as another possible etiology.  2. Moderately reduced left ventricular function with apical akinesis  3. The right ventricle is normal in size with mildly reduced function  4. The left atrium is moderately dilated  5. The main pulmonary artery is mildly dilated  6. Transmural enhancement of the left ventricular apex. May represent scar formation in the setting of apical hypertrophic cardiomyopathy verses prior apical infarct.    11/24/2019 LLE Duplex: negative for DVT    V/Q scan negative 3/22 June 2017 high-resolution CT scan of the chest images independently reviewed showing patchy groundglass throughout with air trapping but no clear evidence of underlying interstitial lung disease. Some evidence of emphysema seen, motion degraded images    October 2019 high-resolution CT scan of the  chest: Prominently dilated main pulmonary artery, some patchy groundglass attenuation throughout both lungs, somewhat centrilobular, scattered subpleural reticulation differential diagnosis includes NSIP versus UIP. Some centrilobular emphysema noted, air trapping noted. Images independently reviewed, there is definitely moderate centrilobular emphysema and an upper lobe predominant fashion as well as centrilobular groundglass      Cath  Date 01/24/20 09/13/19 06/2017 06/2016   Meds uptravi Opsumit, Uptravi     RA (mmHg) 10 6 3 4    RV (mmHg) 79/5 56/7     PA systolic (mmHg) 83 56 49 69   PA diastolic (mmHg) 38 23 16 23    PA mean (mmHg) 53  29 38   PCWP (mmHg) 14 13 10 8    LVEDP (mmHg)       CO (Fick) 3.1 3.49  4.15   CI (Fick) 1.7 1.89     CO (TD) 1.9 4.26  3.14   CI (TD) 1.0 2.3     PVR 12.5 fick      MVO2 49          Echo  Date 06/23/19 6/19 3/19 3/18   RA       RV Normal size. RVSF moderately reduced  Normal size and systolic fxn    RVSP 50-60      TR jet velocity       LA       LV 50-55% EF normal EF 65-70% EF 65-70%   Other  bubble study negative Apical HCM  Severe LAE Apical HCM. Severe LAE, PASP        Date  12/30/19 04/2018 01/2017 07/2016   Oxygen 6L unknown RA RA   Rest sat 100; 85% ra      SpO2 nadir 96      SpO2 end 96      Distance 137 m - stopped early 188 229 109   Rest pulse 71      Max pulse 90      Pulse rate recovery 8      Max Borg 5  PFTs  Date 12/30/19 01/01/18 3/18   FVC 1.95 (86) 1.88 (89) 1.81 (87)   FEV1 1.38 (79) 1/33 (83)    FEV1/FVC 78 83    TLC  4.02 (84)    RV      RV/TLC      DLco  4.75 (22) 7.5 (32)     Reveal Risk Score    Reveal 2.0    score   WHO Group 1  Subroup CTD-PAH  +1 PoPH  +3 Heritable  +2 1          demographics  Female Age > 60  +2  0          comorbidoties  eGFR<60cc/min/1.39m2  +1  0          FC I  -1 III  +1 IV  +2 1          VS SBP<156mmHg  +1  HR>96bpm  +1 0          All Cause  hospitalization  within 6 months  +1  1          ?476m  -2 320 to <475m  -1 <195m  +1 1            BNP <50 or  NTproBNP<300  -2 200 to <800    +1 ?800 or  NT-proBNP?1100  +2 0          ECHO  Effusion  +1  ?          PFTS  DLCO<40%  +1  1          RHC  RAP >61mmHg  +1 w/n 1 year PVR<5WU  -2 0             Sum of above 5       +6      Risk score 11      Score Risk 12 month mortality   4-5    6 Very low risk    Low risk   <2.6%   7-8 Intermediate risk 6.2-7%   ?9 High risk >10.7%     Jac Canavan, MD  Medical Director, Pulmonary Hypertension Program  Spectra [phone]  02/03/2020 3:09 PM    **If after hours (5pm) please page Medicine hospitalist listed under the Treatment Team**

## 2020-02-03 NOTE — Progress Notes (Signed)
MEDICINE PROGRESS NOTE    Date Time: 02/03/20 3:16 PM  Patient Name: [facility]  Attending Physician: Carloyn Manner, MD      Assessment:     Active Hospital Problems    Diagnosis    SOB (shortness of breath)     Ms Cyndi Montejano is a 74 y.o. 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 While she is +ANA positive with positive Ro-52 in the past, she does not have signs of symptoms of systemic rheumatologic conditions including weakness, myalgias, CTD or ILD. The ribosomal p antibody is associated with cerebral lupus with no link to ILD. Per discussion with ALD, her mosaic attenuation may be 2/2 pulmonary edema, and her positive serologies may be a reflection of her strong family history.     Her gout is well controlled on colchicine prophylaxis but her uric acid is not a goal at 8.7. Her allopurinol was at a low dose given the interaction with azathioprine.     Plan:   1) Increase allopurinol to 100 daily given she is no longer on azathioprine  2) Continue colchicine given dose adjustments to above  3) Will arrange for outpatient rheumatology follow up on Dec 1 at 11 am with Dr Saddie Benders  4) Remainder of care per primary team and ALD  5) Ok for discharge from a rheum perspective      Subjective     HPI/Subjective: Patient seen at bedside talking with Raynald Kemp. No new complaints.       Review of Systems:   Review of Systems - See HPI      Physical Exam:       VITAL SIGNS PHYSICAL EXAM   Temp:  [97.5 F (36.4 C)-97.9 F (36.6 C)] 97.7 F (36.5 C)  Heart Rate:  [77-83] 82  Resp Rate:  [15-16] 16  BP: (91-106)/(57-74) 99/65         Intake/Output Summary (Last 24 hours) at 02/03/2020 1516  Last data filed at 02/03/2020 0521  Gross per 24 hour   Intake  --   Output 400 ml   Net -400 ml    Physical Exam  Gen: AAO x 3.  Patient resting comfortably  HEENT: Moist oral mucosa. PERRLA. No scleral icterus. Nasal canula in place  Cardiac: Soft S1 and S2 audible and regular. No murmurs or rubs present  Lungs: CTA x2  Abdomen: + BS in all four quadrants. NT/ND. No rigidity or guarding   Ext: Peripheral pulses +1. Trace non-pitting edema.   Neuro: Answers questions appropriately, no facial droop  Skin: No petechiae, no ecchymoses, warm and dry         Meds:     Medications were reviewed.    Labs:       Labs (last 72 hours):    Recent Labs   Lab 02/03/20  0540 02/02/20  0042   WBC 5.99 6.42   Hgb 12.4 12.2   Hematocrit 37.4 37.5   Platelets 293 280       Recent Labs   Lab 02/01/20  1943   PT 14.0*   PT INR 1.2*    Recent Labs   Lab 02/03/20  0540 02/02/20  0042   Sodium 138 141  Potassium 4.3 3.9   Chloride 111 114*   CO2 17* 16*   BUN 18.0 21.0*   Creatinine 1.2* 1.0   Calcium 8.9 9.0   Albumin 3.6 3.6   Protein, Total 6.5 6.5   Bilirubin, Total 0.5 0.5   Alkaline Phosphatase 111* 107*   ALT 9 9   AST (SGOT) 14 14   Glucose 88 96                 Microbiology, reviewed and are significant for:  Microbiology Results (last 15 days)     ** No results found for the last 360 hours. **            Imaging personally reviewed, including:   XR Chest 2 Views    Result Date: 02/02/2020  1. Mild basilar atelectasis. 2. Interval increased prominence of cardiac silhouette, technical or consider the possibility of pericardial effusion. Correlate with echocardiogram. 3. Discussed with RN [place] at 8: 32 AM on 02/02/2020. Clide Cliff, MD  02/02/2020 8:34 AM    CT Angiogram Chest    Result Date: 02/03/2020   1. No evidence of acute or chronic pulmonary embolism. 2. Dilated pulmonary arteries consistent with pulmonary hypertension. Mosaic perfusion throughout the lungs is consistent with pulmonary hypertension. Carlynn Spry, MD  02/03/2020 11:01 AM    CT Chest High Resolution    Result  Date: 02/02/2020   CT of the chest with high resolution imaging shows no evidence of significant pulmonary interstitial lung disease or pulmonary fibrosis; pulmonary groundglass opacity, seen on prior expiratory imaging, is no longer demonstrated. Cardiomegaly is unchanged. There is no definite pneumonia or CHF. The examination shows mildly enlarged mediastinal lymph nodes which are grossly unchanged from prior study. Coronary artery calcifications are noted. Other findings as noted above. Miguel Dibble, MD  02/02/2020 4:45 AM          Case discussed with: Dr Danelle Earthly and Dr Parke Poisson    Signed by: Berenice Bouton, MD, MD

## 2020-02-03 NOTE — Plan of Care (Addendum)
A&Ox4. VSS. Denies pain.     Safety precautions in place. Call bell within reach. Fall mat in place. Bed alarm on. Hourly rounding completed.     Plan:   Dsicharge tomorrow AM  Atrial lead placement outpatient  Diuresis     Problem: Moderate/High Fall Risk Score >5  Goal: Patient will remain free of falls  02/03/2020 1945 by [name], [name], RN  Outcome: Progressing  Flowsheets (Taken 02/02/2020 2000 by [place], [place] E, RN)  Moderate Risk (6-13):   MOD-Consider activation of bed alarm if appropriate   MOD-Remain with patient during toileting   MOD-Perform dangle, stand, walk (DSW) prior to mobilization  02/03/2020 1944 by [name], [name], RN  Outcome: Progressing     Problem: Inadequate Gas Exchange  Goal: Adequate oxygenation and improved ventilation  02/03/2020 1945 by [name], [name], RN  Outcome: Progressing  Flowsheets (Taken 02/03/2020 1945)  Adequate oxygenation and improved ventilation:   Assess lung sounds   Monitor SpO2 and treat as needed   Increase activity as tolerated/progressive mobility  02/03/2020 1944 by [name], [name], RN  Outcome: Progressing     Problem: Impaired Mobility  Goal: Mobility/Activity is maintained at optimal level for patient  Outcome: Progressing  Flowsheets (Taken 02/03/2020 1945)  Mobility/activity is maintained at optimal level for patient:   Increase mobility as tolerated/progressive mobility   Encourage independent activity per ability     Problem: Safety  Goal: Patient will be free from injury during hospitalization  02/03/2020 1945 by [name], [name], RN  Outcome: Progressing  Flowsheets (Taken 02/03/2020 1945)  Patient will be free from injury during hospitalization:   Assess patient's risk for falls and implement fall prevention plan of care per policy   Provide and maintain safe environment   Use appropriate transfer methods   Ensure appropriate safety devices are available at the bedside   Hourly rounding  02/03/2020 1944 by [name], [name],  RN  Outcome: Progressing

## 2020-02-04 LAB — CBC
Absolute NRBC: 0.04 10*3/uL — ABNORMAL HIGH (ref 0.00–0.00)
Hematocrit: 36.2 % (ref 34.7–43.7)
Hgb: 12.1 g/dL (ref 11.4–14.8)
MCH: 32.6 pg (ref 25.1–33.5)
MCHC: 33.4 g/dL (ref 31.5–35.8)
MCV: 97.6 fL — ABNORMAL HIGH (ref 78.0–96.0)
MPV: 9.7 fL (ref 8.9–12.5)
Nucleated RBC: 0.7 /100 WBC — ABNORMAL HIGH (ref 0.0–0.0)
Platelets: 281 10*3/uL (ref 142–346)
RBC: 3.71 10*6/uL — ABNORMAL LOW (ref 3.90–5.10)
RDW: 24 % — ABNORMAL HIGH (ref 11–15)
WBC: 5.74 10*3/uL (ref 3.10–9.50)

## 2020-02-04 LAB — COMPREHENSIVE METABOLIC PANEL
ALT: 9 U/L (ref 0–55)
AST (SGOT): 19 U/L (ref 5–34)
Albumin/Globulin Ratio: 1.4 (ref 0.9–2.2)
Albumin: 3.7 g/dL (ref 3.5–5.0)
Alkaline Phosphatase: 106 U/L (ref 37–106)
Anion Gap: 11 (ref 5.0–15.0)
BUN: 20 mg/dL — ABNORMAL HIGH (ref 7.0–19.0)
Bilirubin, Total: 0.7 mg/dL (ref 0.2–1.2)
CO2: 18 mEq/L — ABNORMAL LOW (ref 22–29)
Calcium: 8.9 mg/dL (ref 7.9–10.2)
Chloride: 109 mEq/L (ref 100–111)
Creatinine: 1 mg/dL (ref 0.6–1.0)
Globulin: 2.7 g/dL (ref 2.0–3.6)
Glucose: 86 mg/dL (ref 70–100)
Potassium: 4.5 mEq/L (ref 3.5–5.1)
Protein, Total: 6.4 g/dL (ref 6.0–8.3)
Sodium: 138 mEq/L (ref 136–145)

## 2020-02-04 LAB — GFR: EGFR: >60

## 2020-02-04 LAB — B-TYPE NATRIURETIC PEPTIDE: B-Natriuretic Peptide: 109 pg/mL — ABNORMAL HIGH (ref 0–100)

## 2020-02-04 LAB — MAGNESIUM: Magnesium: 1.8 mg/dL (ref 1.6–2.6)

## 2020-02-04 NOTE — Progress Notes (Addendum)
HOME HEALTH LIAISON:   D/C PLAN    DISPO:  Home, No Needs    HOMEBOUND PATIENT:    NO - Does not meet Homebound criteria    SERVICES ALREADY PLACE:       Home Oxygen service     Home Health Aide service 2 to 3 times per week - paid aide in the home 2-3x/week for 4 hours/day    No other needs for Discharge    PRIMARY CONTACT:    daughter:Natalie Eligah East [phone]    Fara Chute, Weekend Only [facility]

## 2020-02-04 NOTE — Plan of Care (Signed)
Plan of care:    Patient seen by bedside. No new complaints. Planning to go home today. Vitals reviewed. RN notes reviewed. Currently still is stable for discharge at this moment of time.    Levonne Lapping, MD, PGY3

## 2020-02-04 NOTE — Discharge Summary -  Nursing (Signed)
VSS. D/c instructions given at bedside with daughter present. Pt stated she had no further questions regarding medicine or follow up appointments. All lines removed and patient wheeled downstairs for pick up with belongings at side.

## 2020-02-04 NOTE — Plan of Care (Signed)
A&Ox4. VSS. Denies pain at this time. Safety precautions maintained throughout shift. Bed in low position, call light within reach. Floor mat present, bed alarm activated. Hourly rounding done. Needs met at this time.     --diuresis with PO lasix  --daily weight, strict I&Os   --D/C Sat. 10/30 AM   --f/u outpt for atrial lead placement     Problem: Moderate/High Fall Risk Score >5  Goal: Patient will remain free of falls  Outcome: Progressing     Problem: Inadequate Gas Exchange  Goal: Adequate oxygenation and improved ventilation  Outcome: Progressing     Problem: Impaired Mobility  Goal: Mobility/Activity is maintained at optimal level for patient  Outcome: Progressing     Problem: Hemodynamic Status: Cardiac  Goal: Stable vital signs and fluid balance  Outcome: Progressing     Problem: Ineffective Gas Exchange  Goal: Effective breathing pattern  Outcome: Progressing     Problem: Safety  Goal: Patient will be free from injury during hospitalization  Outcome: Progressing  Goal: Patient will be free from infection during hospitalization  Outcome: Progressing

## 2020-02-05 LAB — SM AND SM/RNP ANTIBODIES
Sm Antibody: <1
Sm/RNP Antibody: <1

## 2020-02-06 LAB — REFLEX EXTRACTABLE NUCLEAR ANTIGEN ANTIBODY
Centromere Antibody Interpretation: NEGATIVE
Centromere Antibody: 3 U/mL (ref 0–19)
Chromatin Antibody Interp: NEGATIVE
Chromatin Antibody: 6 U/mL (ref 0–19)
Double Stranded DNA(dsDNA)Antibody Interpretation: NEGATIVE
Double Stranded DNA(dsDNA)Antibody: 71 U/mL (ref 0–200)
Histone Antibody Interpretation: POSITIVE — AB
Histone Antibody: 1.2 U/mL — ABNORMAL HIGH (ref 0.0–0.9)
JO-1 Antibody Interpretation: NEGATIVE
Jo-1 Antibody: 7 U/mL (ref 0–19)
RNA Polymerase III Antibody Interpretation: NEGATIVE
RNA Polymerase III Antibody: 6 U/mL (ref 0–19)
Ribonucleoprotein (RNP) Antibody Interpretation: NEGATIVE
Ribonucleoprotein (RNP) Antibody: 6 U/mL (ref 0–19)
Ribosomal P Antibody Interpretation: POSITIVE — AB
Ribosomal P Antibody: 39 U/mL — ABNORMAL HIGH (ref 0–19)
Scleroderma (Scl-70) Antibody Interpretation: NEGATIVE
Scleroderma (Scl-70) Antibody: 9 U/mL (ref 0–19)
Sjogrens SSA (RO) Antibody Interpretation: NEGATIVE
Sjogrens SSA (Ro) Antibody: 6 U/mL (ref 0–19)
Sjogrens SSB (La) Antibody Interpretation: NEGATIVE
Sjogrens SSB (La) Antibody: 5 U/mL (ref 0–19)
Smith (Sm) Antibody Interpretation: NEGATIVE
Smith (Sm) Antibody: 4 U/mL (ref 0–19)

## 2020-02-08 LAB — MISCELLANEOUS QUEST TEST

## 2020-02-10 DIAGNOSIS — I4891 Unspecified atrial fibrillation: Secondary | ICD-10-CM | POA: Diagnosis not present

## 2020-02-10 DIAGNOSIS — I272 Pulmonary hypertension, unspecified: Secondary | ICD-10-CM | POA: Diagnosis not present

## 2020-02-10 DIAGNOSIS — Z950 Presence of cardiac pacemaker: Secondary | ICD-10-CM | POA: Diagnosis not present

## 2020-02-10 DIAGNOSIS — Z9889 Other specified postprocedural states: Secondary | ICD-10-CM | POA: Diagnosis not present

## 2020-02-15 DIAGNOSIS — D8685 Sarcoid myocarditis: Secondary | ICD-10-CM | POA: Diagnosis not present

## 2020-02-15 DIAGNOSIS — I700 Atherosclerosis of aorta: Secondary | ICD-10-CM | POA: Diagnosis not present

## 2020-02-15 DIAGNOSIS — I43 Cardiomyopathy in diseases classified elsewhere: Secondary | ICD-10-CM | POA: Diagnosis not present

## 2020-02-20 ENCOUNTER — Encounter (INDEPENDENT_AMBULATORY_CARE_PROVIDER_SITE_OTHER): Payer: Self-pay | Admitting: Cardiovascular Disease

## 2020-02-22 ENCOUNTER — Ambulatory Visit: Payer: Medicare Other

## 2020-02-22 ENCOUNTER — Other Ambulatory Visit: Payer: Medicare Other

## 2020-02-22 ENCOUNTER — Other Ambulatory Visit (INDEPENDENT_AMBULATORY_CARE_PROVIDER_SITE_OTHER): Payer: Self-pay

## 2020-02-22 ENCOUNTER — Encounter: Payer: Medicare Other | Admitting: Internal Medicine

## 2020-02-22 ENCOUNTER — Other Ambulatory Visit: Payer: Self-pay

## 2020-02-22 ENCOUNTER — Ambulatory Visit (INDEPENDENT_AMBULATORY_CARE_PROVIDER_SITE_OTHER): Payer: Medicare Other | Admitting: Clinical Cardiac Electrophysiology

## 2020-02-22 VITALS — BP 90/66 | Resp 18 | Ht 62.0 in | Wt 182.0 lb

## 2020-02-22 DIAGNOSIS — Z950 Presence of cardiac pacemaker: Secondary | ICD-10-CM

## 2020-02-22 DIAGNOSIS — I480 Paroxysmal atrial fibrillation: Secondary | ICD-10-CM

## 2020-02-22 DIAGNOSIS — I270 Primary pulmonary hypertension: Secondary | ICD-10-CM

## 2020-02-22 NOTE — Telephone Encounter (Signed)
Patient called to request refill on her allopurinol rx. She states that Dr. Danelle Earthly put her on the 100mg  when she was hospitalized. She has an upcoming npt appointment with Dr. Saddie Benders on 03/18/2020 but has completely ran out of her allopurinol. Pharmacy on file has been verified. Please review and refill if appropriate.

## 2020-02-22 NOTE — Progress Notes (Signed)
[facility]  Lilia Argue, MD PhD  [phone]    Assessment  74 y.o. 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 Atrial arrhythmias  2. CHB (iatrogenic)  3. Single chamber PPM (LBB lead)  Continues to maintain NSR with AV dyssynchrony given single chamber (LBB) pacemaker only. I again recommended atrial lead insertion (upgrade to dual chamber PPM) to give her AV synchrony that will hopefully improve some symptoms. After discussion of the risks (including infection, bleeding, PTX, stroke, death, more), benefits, and alternatives, she agrees to this plan. Will have her hold NOAC x3 days especially given jehovas witness. See 10/28 consult note for device history. Existing device working well on manual interrogation. 100% Vp, one brief NSVT only.    Plan  1. Schedule for RA lead addition  2. Continue NOAC-- hold x3 days pre procedure    Follow Up: Post procedure  Coordinating providers: Celine Mans  I have shared these recommendations with the requesting provider.                    __________________________________________    HPI  10/29: Atrial lead addition     Feeling well. Improved since her hospitalization      Cardiac Diagnostics  Relevant past medical records including outside studies (through [facility] Everywhere and scanned patient records as available) were reviewed, with findings incorporated into the remainder of this note. I have independently reviewed and interpreted available ECGs. I have personally reviewed all recent relevant lab results.     ECG (02/22/2020): NSR with CHB and LBB pacing    TTE 02/02/20:  * The left ventricle is normal in size.  * Left ventricular wall thickness is severely increased, particularly  towards the mid cavity and apex consistent with mid/apical hypertrophic cardiomyopathy.  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  * Left ventricular systolic function is mildly decreased with an ejection fraction by Biplane Method of Discs of 50 %.  * Left ventricular diastolic filling parameters are consistent with Grade II  diastolic dysfunction (pseudonormal pattern) with elevated left atrial  pressure.  * There is diastolic mital regurgitation consistent with AV dissociation.  * The right ventricular cavity size is normal in size.  * Moderately-to-severely decreased right ventricular systolic function  (TAPSE 1.0 cm, 3D RVEF 28% and FAC 24%).  * The left atrium is severely dilated.  * The right atrium is dilated.  * There is moderate tricuspid regurgitation.  * Severe pulmonary hypertension with estimated right ventricular systolic  pressure of 87 mmHg.  * No prior study is available for comparison.    09/20/2019 Cardiac MRI:  LVEF 35%, SV 34mL, LVEDV 98mL, LVESV 64mL  RVEF 40%, SV 48mL, RVEDV , EVESV 64mL  Impression:  1. 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 Would consider coronary disease with previous apical infarction as another possible etiology.  2. Moderately reduced left ventricular function with apical akinesis  3. The right ventricle is normal in size with mildly reduced function  4. The left atrium is moderately dilated  5. The main pulmonary artery is mildly dilated  6. Transmural enhancement of the left ventricular apex. May represent scar formation in the setting of apical hypertrophic cardiomyopathy verses prior apical infarct.    LHC at [facility] with non  significant coronary disease.      Relevant Labs  Lab Results   Component Value Date    WBC 5.74 02/04/2020    HGB 12.1 02/04/2020    PLT 281 02/04/2020    NA 138 02/04/2020    K 4.5 02/04/2020    MG 1.8 02/04/2020    CREAT 1.0 02/04/2020    AST 19 02/04/2020    ALT 9 02/04/2020    TSH 2.25 02/01/2020    INR 1.2 (H) 02/01/2020     BNP 109 (H) 02/04/2020       Past Medical History:   Diagnosis Date    A-fib     Cardiomyopathy     CKD (chronic kidney disease)     Congestive heart failure     COPD (chronic obstructive pulmonary disease)     COVID-19     Disorder of thyroid     Gastrointestinal hemorrhage     HTN (hypertension)     NSTEMI (non-ST elevated myocardial infarction)     During LHC    Obesity     OSA on CPAP     PNA (pneumonia)         Current Outpatient Medications   Medication Sig Dispense Refill    allopurinol (ZYLOPRIM) 100 MG tablet Take 1 tablet (100 mg total) by mouth daily Per pt taking 50mg  (half tab) 30 tablet 0    apixaban (ELIQUIS) 5 MG Take 1 tablet (5 mg total) by mouth every 12 (twelve) hours 60 tablet 11    atorvastatin (LIPITOR) 80 MG tablet Take 80 mg by mouth daily      Colchicine 0.6 MG capsule Take 0.6 mg by mouth daily      ezetimibe (ZETIA) 10 MG tablet Take 10 mg by mouth daily      ferrous sulfate 325 (65 FE) MG tablet Take 325 mg by mouth Once every Monday, Wednesday and Friday morning Taking on Monday wed, friday         furosemide (LASIX) 40 MG tablet Take 1 tablet (40 mg total) by mouth 2 (two) times daily 60 tablet 0    levothyroxine (SYNTHROID) 100 MCG tablet Take 50 mcg by mouth Once a day at 6:00am         OXYGEN-HELIUM IN Inhale into the lungs      potassium chloride (K-DUR) 10 MEQ tablet Take 10 mEq by mouth      Selexipag (Uptravi) 1600 MCG Tab Take 1,600 mcg by mouth 2 (two) times daily      spironolactone (ALDACTONE) 25 MG tablet Take 1 tablet (25 mg total) by mouth 2 (two) times daily 60 tablet 0    vitamin D (CHOLECALCIFEROL) 25 MCG (1000 UT) tablet Take 2,000 Units by mouth daily       No current facility-administered medications for this visit.        Social History     Tobacco Use    Smoking status: Former Smoker     Years: 20.00     Types: Cigarettes    Smokeless tobacco: Never Used    Tobacco comment: stopped 20 years ago   Vaping Use    Vaping Use: Never used    Substance Use Topics    Alcohol use: Never    Drug use: Never       Family History   Problem Relation Age of Onset    Alzheimer's disease Mother     Lung cancer Father      Family history otherwise non-contributory  Review of Systems  All other systems reviewed and negative except as above.      ____________________  Rhunette Croft, MD PhD  [facility]   Office/AfterHours: [place]: [phone]

## 2020-02-24 DIAGNOSIS — D538 Other specified nutritional anemias: Secondary | ICD-10-CM | POA: Diagnosis not present

## 2020-02-27 ENCOUNTER — Other Ambulatory Visit (INDEPENDENT_AMBULATORY_CARE_PROVIDER_SITE_OTHER): Payer: Self-pay | Admitting: Clinical Cardiac Electrophysiology

## 2020-02-27 DIAGNOSIS — D8989 Other specified disorders involving the immune mechanism, not elsewhere classified: Secondary | ICD-10-CM | POA: Diagnosis not present

## 2020-02-27 DIAGNOSIS — J449 Chronic obstructive pulmonary disease, unspecified: Secondary | ICD-10-CM | POA: Diagnosis not present

## 2020-02-27 DIAGNOSIS — N189 Chronic kidney disease, unspecified: Secondary | ICD-10-CM | POA: Diagnosis not present

## 2020-02-27 DIAGNOSIS — G4733 Obstructive sleep apnea (adult) (pediatric): Secondary | ICD-10-CM | POA: Diagnosis not present

## 2020-02-27 DIAGNOSIS — E039 Hypothyroidism, unspecified: Secondary | ICD-10-CM | POA: Diagnosis not present

## 2020-02-27 DIAGNOSIS — Z7901 Long term (current) use of anticoagulants: Secondary | ICD-10-CM | POA: Diagnosis not present

## 2020-02-27 DIAGNOSIS — D849 Immunodeficiency, unspecified: Secondary | ICD-10-CM | POA: Diagnosis not present

## 2020-02-27 DIAGNOSIS — Z01818 Encounter for other preprocedural examination: Secondary | ICD-10-CM | POA: Diagnosis not present

## 2020-02-27 DIAGNOSIS — I480 Paroxysmal atrial fibrillation: Secondary | ICD-10-CM | POA: Diagnosis not present

## 2020-02-27 DIAGNOSIS — J849 Interstitial pulmonary disease, unspecified: Secondary | ICD-10-CM | POA: Diagnosis not present

## 2020-02-27 DIAGNOSIS — I4891 Unspecified atrial fibrillation: Secondary | ICD-10-CM | POA: Diagnosis not present

## 2020-02-27 DIAGNOSIS — I272 Pulmonary hypertension, unspecified: Secondary | ICD-10-CM | POA: Diagnosis not present

## 2020-02-27 DIAGNOSIS — Z8616 Personal history of COVID-19: Secondary | ICD-10-CM | POA: Diagnosis not present

## 2020-02-28 LAB — CBC AND DIFFERENTIAL
Baso(Absolute): 0.1 10*3/uL (ref 0.0–0.2)
Basos: 1 %
Eos: 2 %
Eosinophils Absolute: 0.1 10*3/uL (ref 0.0–0.4)
Hematocrit: 38.7 % (ref 34.0–46.6)
Hemoglobin: 12.6 g/dL (ref 11.1–15.9)
Immature Granulocytes Absolute: 0 10*3/uL (ref 0.0–0.1)
Immature Granulocytes: 0 %
Lymphocytes Absolute: 1.1 10*3/uL (ref 0.7–3.1)
Lymphocytes: 20 %
MCH: 31.3 pg (ref 26.6–33.0)
MCHC: 32.6 g/dL (ref 31.5–35.7)
MCV: 96 fL (ref 79–97)
Monocytes Absolute: 0.6 10*3/uL (ref 0.1–0.9)
Monocytes: 12 %
Neutrophils Absolute: 3.4 10*3/uL (ref 1.4–7.0)
Neutrophils: 65 %
Platelets: 265 10*3/uL (ref 150–450)
RBC: 4.03 x10E6/uL (ref 3.77–5.28)
RDW: 19.5 % — ABNORMAL HIGH (ref 11.7–15.4)
WBC: 5.2 10*3/uL (ref 3.4–10.8)

## 2020-02-28 LAB — BASIC METABOLIC PANEL
African American eGFR: 50 mL/min/{1.73_m2} — ABNORMAL LOW (ref >59)
BUN / Creatinine Ratio: 20 (ref 12–28)
BUN: 24 mg/dL (ref 8–27)
CO2: 16 mmol/L — ABNORMAL LOW (ref 20–29)
Calcium: 9.2 mg/dL (ref 8.7–10.3)
Chloride: 108 mmol/L — ABNORMAL HIGH (ref 96–106)
Creatinine: 1.23 mg/dL — ABNORMAL HIGH (ref 0.57–1.00)
Glucose: 106 mg/dL — ABNORMAL HIGH (ref 65–99)
Potassium: 4.4 mmol/L (ref 3.5–5.2)
Sodium: 139 mmol/L (ref 134–144)
non-African American eGFR: 43 mL/min/{1.73_m2} — ABNORMAL LOW (ref >59)

## 2020-03-02 ENCOUNTER — Ambulatory Visit (HOSPITAL_BASED_OUTPATIENT_CLINIC_OR_DEPARTMENT_OTHER): Payer: Medicare Other | Admitting: Cardiovascular Disease

## 2020-03-02 ENCOUNTER — Encounter: Payer: Self-pay | Admitting: Cardiovascular Disease

## 2020-03-02 ENCOUNTER — Other Ambulatory Visit: Payer: Medicare Other | Attending: Internal Medicine

## 2020-03-02 ENCOUNTER — Ambulatory Visit: Payer: Medicare Other

## 2020-03-02 VITALS — BP 100/69 | HR 79 | Temp 97.5°F | Resp 22 | Wt 182.6 lb

## 2020-03-02 DIAGNOSIS — I714 Abdominal aortic aneurysm, without rupture, unspecified: Secondary | ICD-10-CM

## 2020-03-02 DIAGNOSIS — R0602 Shortness of breath: Secondary | ICD-10-CM

## 2020-03-02 DIAGNOSIS — I270 Primary pulmonary hypertension: Secondary | ICD-10-CM

## 2020-03-02 DIAGNOSIS — I2721 Secondary pulmonary arterial hypertension: Secondary | ICD-10-CM | POA: Insufficient documentation

## 2020-03-02 DIAGNOSIS — R601 Generalized edema: Secondary | ICD-10-CM

## 2020-03-02 DIAGNOSIS — I422 Other hypertrophic cardiomyopathy: Secondary | ICD-10-CM

## 2020-03-02 DIAGNOSIS — E559 Vitamin D deficiency, unspecified: Secondary | ICD-10-CM | POA: Insufficient documentation

## 2020-03-02 DIAGNOSIS — R5383 Other fatigue: Secondary | ICD-10-CM | POA: Insufficient documentation

## 2020-03-02 DIAGNOSIS — Z1329 Encounter for screening for other suspected endocrine disorder: Secondary | ICD-10-CM | POA: Insufficient documentation

## 2020-03-02 DIAGNOSIS — Z130 Encounter for screening for diseases of the blood and blood-forming organs and certain disorders involving the immune mechanism: Secondary | ICD-10-CM | POA: Insufficient documentation

## 2020-03-02 DIAGNOSIS — I272 Pulmonary hypertension, unspecified: Secondary | ICD-10-CM

## 2020-03-02 DIAGNOSIS — I480 Paroxysmal atrial fibrillation: Secondary | ICD-10-CM

## 2020-03-02 LAB — CBC AND DIFFERENTIAL
Absolute NRBC: 0.02 10*3/uL — ABNORMAL HIGH (ref 0.00–0.00)
Basophils Absolute Automated: 0.04 10*3/uL (ref 0.00–0.08)
Basophils Automated: 0.8 %
Eosinophils Absolute Automated: 0.09 10*3/uL (ref 0.00–0.44)
Eosinophils Automated: 1.7 %
Hematocrit: 42.5 % (ref 34.7–43.7)
Hgb: 13.1 g/dL (ref 11.4–14.8)
Immature Granulocytes Absolute: 0.02 10*3/uL (ref 0.00–0.07)
Immature Granulocytes: 0.4 %
Lymphocytes Absolute Automated: 1.18 10*3/uL (ref 0.42–3.22)
Lymphocytes Automated: 22.2 %
MCH: 31.9 pg (ref 25.1–33.5)
MCHC: 30.8 g/dL — ABNORMAL LOW (ref 31.5–35.8)
MCV: 103.4 fL — ABNORMAL HIGH (ref 78.0–96.0)
MPV: 11.4 fL (ref 8.9–12.5)
Monocytes Absolute Automated: 0.57 10*3/uL (ref 0.21–0.85)
Monocytes: 10.7 %
Neutrophils Absolute: 3.42 10*3/uL (ref 1.10–6.33)
Neutrophils: 64.2 %
Nucleated RBC: 0.4 /100 WBC — ABNORMAL HIGH (ref 0.0–0.0)
Platelets: 248 10*3/uL (ref 142–346)
RBC: 4.11 10*6/uL (ref 3.90–5.10)
RDW: 21 % — ABNORMAL HIGH (ref 11–15)
WBC: 5.32 10*3/uL (ref 3.10–9.50)

## 2020-03-02 LAB — HEMOLYSIS INDEX: Hemolysis Index: 21 (ref 0–24)

## 2020-03-02 LAB — CELL MORPHOLOGY
Cell Morphology: ABNORMAL — AB
Platelet Estimate: NORMAL

## 2020-03-02 LAB — COMPREHENSIVE METABOLIC PANEL
ALT: 26 U/L (ref 0–55)
AST (SGOT): 21 U/L (ref 5–34)
Albumin/Globulin Ratio: 1.3 (ref 0.9–2.2)
Albumin: 4 g/dL (ref 3.5–5.0)
Alkaline Phosphatase: 125 U/L — ABNORMAL HIGH (ref 37–117)
Anion Gap: 13 (ref 5.0–15.0)
BUN: 19 mg/dL (ref 7.0–19.0)
Bilirubin, Total: 0.8 mg/dL (ref 0.2–1.2)
CO2: 14 mEq/L — ABNORMAL LOW (ref 21–29)
Calcium: 8.7 mg/dL (ref 7.9–10.2)
Chloride: 110 mEq/L (ref 100–111)
Creatinine: 1.1 mg/dL (ref 0.4–1.5)
Globulin: 3.2 g/dL (ref 2.0–3.7)
Glucose: 74 mg/dL (ref 70–100)
Potassium: 5.2 mEq/L — ABNORMAL HIGH (ref 3.5–5.1)
Protein, Total: 7.2 g/dL (ref 6.0–8.3)
Sodium: 137 mEq/L (ref 136–145)

## 2020-03-02 LAB — GFR: EGFR: 58.6

## 2020-03-02 LAB — B-TYPE NATRIURETIC PEPTIDE: B-Natriuretic Peptide: 103 pg/mL — ABNORMAL HIGH (ref 0–100)

## 2020-03-02 NOTE — Patient Instructions (Addendum)
Please return to the [facility] clinic to see Dr. Kyung Rudd in January with the following tests. Please call the Front Desk at [phone] to make your appointment.   - 6 min walk    Medications: Continue current medications as prescribed.  - Please think about Cordie Grice and if you think it has helped you    Please complete the following prior to your next visit:  - Ultrasound of abdomen to rule out aneurysm: please call Central Scheduling at [phone] to schedule.  - Sleep medicine doctor recommendation:   Dr. Carney Living  [facility]  [address]   [place], [place] [address]  tel: [phone]    Dr. Zollie Pee  [facility]  [address]  [address]  [place], [place] [address]   P: [phone]     For clinical concerns, oxygen issues, and prescription renewals, please contact the PH Coordinator:   Olin Pia, MSN, RN     [phone]   [phone]       [email or web address]    For appointments and scheduling needs, please contact the Front Desk at [phone] or Synthia Innocent at [phone]   To schedule 6 minute walk tests, Spirometry and PFTs please call Pulmonary Diagnostics at [phone]   To schedule Echocardiograms, please call Cardiac Diagnostics at [phone]   To schedule CT Scans or VQ Scans, please call Central Scheduling at [phone]   For issues concerning disability and/or work letters please contact our social worker Nunzio Cobbs LCSW at [phone] or [email or web address]   For issues concerning nutrition, please contact our dietitian Charleston Poot RD at [phone] or [email or web address]   For issues concerning insurance and bill related issues please contact our finance coordinator Calton Dach at [phone] or [email or web address]   For research and clinical studies please visit: [email or web address]   For after-hours emergency concerns,  please call the hospital operator at [phone] and ask for the On-call Lung Transplant Coordinator

## 2020-03-02 NOTE — Progress Notes (Signed)
03/02/20 1030   Six Minute Walk   Status Completed   $ Pulm Stress Test Performed? Simple Pulm Stress Test - CPT [phone]   Oximeter probe site Forehead   Resting RA SpO2 92 %   Baseline FiO2 / O2  6lpm   Baseline SpO2  100 %   Baseline HR 79   Baseline BP 100/69   Baseline BORG level 3   1 minute distance in feet 75 feet   1 minute distance in meters 22.9 meters   1 minute SpO2 96 %   1 minute heart rate 93 BPM   1 minute BORG level 3   2 minute SpO2 94 %  (total rest 10 secs)   2 minute heart rate 92 BPM   2 minute BORG level 3   3 minute distance in feet 205 feet   3 minute distance in meters 62.5 meters   3 minute SpO2 96 %   3 minute heart rate 98 BPM   3 minute BORG level 4   6 minute distance in feet 320 feet   6 minute distance in meters 97.3 meters   6 minute SpO2 96 %   6 minute heart rate 93 BPM   6 minute BORG level 4   Recovery time (min) 1.00   Recovery FiO2 / O2  6lpm   Recovery BP 104/67   Recovery SpO2 97 %   Recovery heart rate 88 BPM   Recovery BORG level 3   Reason stopped Time limit   Supportive devices Walker w/wheels   Performing Departments   PLAB six minute walk performing department formula [phone]

## 2020-03-02 NOTE — Progress Notes (Signed)
Dear [name], [name], [name], and [name]:    I had the pleasure of seeing [name] today in the Cardiology Clinic in consultation at Dr. Danelle Best request for assistance with management of heart failure.  As you know, [name] is a very pleasant 74 y.o. lady with pulmonary hypertension, hypertrophic cardiomyopathy, antisynthetase syndrome, interstitial lung disease, paroxysmal atrial fibrillation, and sleep apnea.  She lived in [place] and was followed at [facility] [place] and [place] until earlier this year when she moved to [place] to be closer to her two adult daughters.  She established care at [facility], but then Dr. Electa Best left so she was referred to [facility].      I reviewed her extensive records.       An echo in 2013 was suggestive of apical variant hypertrophic cardiomyopathy and a cardiac MRI in 2016 was diagnostic.  Cardiac catheterization in 2016 found a moderate first diagonal lesion and no other significant coronary disease, LVEDP was reported as "mildly elevated."  She reports genetic testing at [facility] was unrevealing, her two adult children are aware of her diagnosis and the need for screening.  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  She underwent left and right heart catheterization at [facility] over the summer, I have requested those results.  More recently she underwent cardiac PET which describes apical inflammation, I've requested those results.      With regards to atrial fibrillation, the first mention in the records is in 2013.  She was treated with a variety of antiarrhythmics with marginal benefit, there was concern for amiodarone-induced lung toxicity.  She underwent an ablation procedure 04/24/15 which included the cavo-tricuspid isthmus and pulmonary vein isolation, there were also left atrial flutter circuits which could not be ablated.  She underwent AVJ ablation and  implantation of a VVI pacemaker 01/31/19, [name] states this intervention has provided the most significant symptomatic benefit.  She's unfortunately had several episodes of GI bleeding, most recently August 2021.  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  She is scheduled to undergo implantation of an atrial lead with Dr. Shelly Best next Tuesday.      Pulmonary hypertension was diagnosed 06/26/16 by right heart catheterization (RA 4, PA 69/23/38, PCWP 8, TPG 30, Fick CO 4.15 and PVR 7.3, thermal CO 3.14 and PVR 9.6).  Based on this result, she was treated with macitentan with some benefit.  She did not tolerate tadalafil, selexipag was subsequently added.  A note from February 2019 describes worsening symptoms and hypoxia prompting repeat right heart catheterization 06/10/17 (RA 6, PA 49/16/29, PCWP 10, Fick CI 1.9 PVR 5.3, thermal CI 1.4 PVR 7.3 on macitentan 10 and selexipag 1000 bid).  A pulmonology note from 07/03/17 describes worsening hypoxia from March 2018 through 2019.  Echo bubble study 09/08/17 found no evidence of right to left shunt, though ablation procedure in 2017 included transseptal puncture.  A trial of riociguat was stopped due to hypotension.  Opsumit was discontinued in September.      There was concern for parenchymal lung disease contributing to hypoxia.  Rheumatology evaluation was consistent with anti-synthetase syndrome.  She was initially treated with prednisone and MMF.  She was unable to tolerate MMF due to joint pain and GI bleding, she was transitioned to imuran. During her recent hospitalization Imuran was discontinued.       [name] has been  hospitalized several times this year, in March with COVID, August with GI bleeding, and October with worsening dyspnea.  She felt her breathing improved a little with her October hospitalization.  She is able to do light household chores like cooking small meals, she has a home health aide several days a week to help  with more strenuous chores, running, errands, etc.  She lives alone, she does have two adult daughters in the area.  She's not had trouble with chest pain or tightness.  She feels lightheaded with exertion, she's not passed out or come close to doing so.  She has mild left lower extremity edema which is longstanding, none on the right.  She's not had palpitations.  She denies orthopnea or PND, she does wake up every couple hours to use the bathroom.  She does not clearly remember a benefit from selexipag, she does remember being able to do more before starting it.  She started requiring oxygen in 2018, she doesn't remember whether it was before or after PH was diagnosed or medications started.  She is compliant with CPAP.      Past Medical History:   Diagnosis Date    A-fib     Cardiomyopathy     CKD (chronic kidney disease)     Congestive heart failure     COPD (chronic obstructive pulmonary disease)     COVID-19     Disorder of thyroid     Gastrointestinal hemorrhage     HTN (hypertension)     NSTEMI (non-ST elevated myocardial infarction)     During LHC    Obesity     OSA on CPAP     PNA (pneumonia)      Past Surgical History:   Procedure Laterality Date    CARDIAC ABLATION      A-FIB ABLATION    CESAREAN SECTION      INSERT / REPLACE / REMOVE PACEMAKER      RIGHT HEART CATH ONLY INCL. CO AND SATS Right 01/24/2020    Procedure: RIGHT HEART CATH w/ exercise;  Surgeon: Diane Forts, MD;  Location: [facility];  Service: Cardiovascular;  Laterality: Right;  same day discharge     Social and Family History:  Lives in [place], [place].  Her second husband died after just a couple years of marriage.  Former smoker, quit around 2000.  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  Sister died of lupus and Goodpasture's syndrome.  Paternal grandmother died of a "heart attack" at 45, paternal aunt of a "heart attack,"  other paternal relatives with heart disease.  Also several family members with autoimmune diseases.     Allergies   Allergen Reactions    Amiodarone Other (See Comments)     Lung toxicity    Cellcept [Mycophenolate]      Gastro hemorrhages     Zyban [Bupropion] Itching     Current Outpatient Medications on File Prior to Visit   Medication Sig Dispense Refill    allopurinol (ZYLOPRIM) 100 MG tablet Take 1 tablet (100 mg total) by mouth daily Per pt taking 50mg  (half tab) 30 tablet 0    apixaban (ELIQUIS) 5 MG Take 1 tablet (5 mg total) by mouth every 12 (twelve) hours 60 tablet 11    atorvastatin (LIPITOR) 80 MG tablet Take 80 mg by mouth daily      Colchicine 0.6 MG capsule Take 0.6  mg by mouth daily      ezetimibe (ZETIA) 10 MG tablet Take 10 mg by mouth daily      ferrous sulfate 325 (65 FE) MG tablet Take 325 mg by mouth Once every Monday, Wednesday and Friday morning Taking on Monday wed, friday         furosemide (LASIX) 40 MG tablet Take 1 tablet (40 mg total) by mouth 2 (two) times daily (Patient taking differently: Take 40 mg by mouth daily   ) 60 tablet 0    levothyroxine (SYNTHROID) 100 MCG tablet Take 50 mcg by mouth Once a day at 6:00am         Multiple Vitamin (multivitamin) capsule Take 1 capsule by mouth daily      OXYGEN-HELIUM IN Inhale into the lungs      pantoprazole (PROTONIX) 40 MG tablet Take 40 mg by mouth daily      potassium chloride (K-DUR) 10 MEQ tablet Take 10 mEq by mouth      Selexipag (Uptravi) 1600 MCG Tab Take 1,600 mcg by mouth 2 (two) times daily      spironolactone (ALDACTONE) 25 MG tablet Take 1 tablet (25 mg total) by mouth 2 (two) times daily (Patient taking differently: Take 25 mg by mouth daily   ) 60 tablet 0    vitamin D (CHOLECALCIFEROL) 25 MCG (1000 UT) tablet Take 2,000 Units by mouth daily       No current facility-administered medications on file prior to visit.     Review of Systems:  Complete 14 point review of systems was performed.  It is  significant for the findings mentioned in the HPI.  It is also significant for loose bowel movements 3-4 times a day, poor appetite.  All other systems are negative.    Physical Exam:  BP 100/69    Pulse 79    Temp 97.5 F (36.4 C) (Oral)    Resp 22 Comment: 6 L 02   Wt 82.8 kg (182 lb 9.6 oz)    SpO2 100% Comment: 6LNC   BMI 33.40 kg/m   Wt Readings from Last 3 Encounters:   03/02/20 82.8 kg (182 lb 9.6 oz)   02/22/20 82.6 kg (182 lb)   02/04/20 82.6 kg (182 lb 1.6 oz)     In general, [name] is a well-developed, well-nourished African American lady appearing stated age.  She is alert and oriented times three and in no acute distress.  There is no scleral icterus.  There is no jugular venous distention.  No carotid bruits.  The carotid upstrokes are normal.  The heart has a regular rhythm with normal S1 and S2.  There are no murmurs, rubs, gallops, or clicks.  The lungs are clear to auscultation bilaterally.  The abdomen is soft, nontender, and nondistended.  Bowel sounds are present.  There is no hepatosplenomegaly or ascites.  It is obese.  The extremities are warm and well-perfused.  There is no clubbing or cyanosis.  thedre is trace left lower extremity edema, none on the right.  Radial and pedal pulses are 2+ bilaterally.  On skin exam, there are no rashes or other lesions.    6 minute walk  Today 97.3 m on 6 L NC.  12/30/19 137.2 m on 6 L NC    EKG 02/22/20 sinus, complete heart block, V paced at 80 bpm    TTE 02/02/20, which I independently reviewed and interpreted, found mildly impaired LV function with apical akinesis/aneurysm, LVH, mildly  impaired RV function, left more than right atrial enlargement, mild TR, dilated but collapsible IVC, and estimated PASP 85-90 mmHg.      Right heart catheterization 01/24/20, which I independently reviewed and interpreted, found   Baseline RA 10, PCWP 18 end-expiration, PA 78/37/51, TPG 33, CO/CI/PVR Fick 3.12/1.68/10.6, thermal 1.9/1.0/17.4.   Exercise PCWP 26,  PA 93/47/62.  Systemic BP ranged 127-150/64-87, HR 70, SpO2 98-100% on 6 L NC    Right heart catheterization 09/13/19 RA 6, PA 56/23/34, PCWP 13, TPG 21, CO/CI/PVR Fick 3.49/1.89/6.0, thermal 4.26/2.3/4.9.    Right heart catheterization 06/10/17 RA 6, PA 49/16/29, PCWP 10, Fick CI 1.9 PVR 5.3, thermal CI 1.4 PVR 7.3 on macitentan 10 and selexipag 1000 bid.    Right heart catheterization 06/26/16 RA 4, PA 69/23/38, PCWP 8, TPG 30, Fick CO 4.15 and PVR 7.3, thermal CO 3.14 and PVR 9.6    Cardiac MRI 09/20/19, which I independently reviewed and interpreted, found interatrial septum bows to the right  1.  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  Would also consider coronary disease with previous apical infarction is another possible etiology.  2.  Moderately reduced left ventricular function with apical akinesis.  LVEF 35%.   3.  The right ventricle is normal in side with mildly reduced function.  RVEF 40%.   4.  The left atrium is moderately dilated.   5.  The main pulmonary artery is mildly dilated.   6.  Transmural enhancement of the left ventricular apex.  May represent scar formation in the setting of apical hypertrophic cardiomyopathy versus prior apical infarct.      Chest CT 02/01/20, which I independently reviewed and interpreted, found aortic and coronary calcifications, dilated left atrium and pulmonary artery but relatively small right heart,   Mildly enlarged lymph nodes in the anterior and middle mediastinum are  again noted and appear grossly unchanged.  The largest lymph node is  located the pretracheal retrovascular space and measures 2.2 x 1.5 cm.  CT of the chest with high resolution imaging shows no  evidence of significant pulmonary interstitial lung disease or pulmonary  fibrosis; pulmonary groundglass opacity, seen on prior expiratory  imaging, is no longer demonstrated. Cardiomegaly is unchanged. There is  no  definite pneumonia or CHF. The examination shows mildly enlarged  mediastinal lymph nodes which are grossly unchanged from prior study.  Coronary artery calcifications are noted. Other findings as noted above.    DE CTPA 02/03/20  1. No evidence of acute or chronic pulmonary embolism.  2. Dilated pulmonary arteries consistent with pulmonary hypertension. Mosaic perfusion throughout the lungs is consistent with pulmonary hypertension.    Cardiac PET  1.  Findings consistent with active inflammatory process within the apex and periapical myocardium of the LV. Also active inflammatory process suspected within the anterior wall right ventricle and free lateral wall of the right atrium as well.  2.  Incidental note is noted made of asymmetrical hypermetabolic activity within the right oropharynx in the right upper cervical lymph node. Although this may be a nonspecific finding related to physiological asymmetry and reactive adenopathy, given the combined findings on the lateral side, direct visualization is suggested to exclude possibility of malignancy.  3. Otherwise unremarkable with no evidence of extracardiac inflammatory process of significance. No  4. 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 Dedicated CT imaging for further evaluation is suggested as  warranted if unknown.    PFTs 12/30/19 FVC 1.95 L (86%), FEV1 1.38 L (79%), ratio 0.71.     Lab Results   Component Value Date    WBC 5.2 02/27/2020    HGB 12.6 02/27/2020    HCT 38.7 02/27/2020    MCV 96 02/27/2020    PLT 265 02/27/2020     Lab Results   Component Value Date    CREAT 1.23 (H) 02/27/2020    BUN 24 02/27/2020    NA 139 02/27/2020    K 4.4 02/27/2020    CL 108 (H) 02/27/2020    CO2 16 (L) 02/27/2020     Lab Results   Component Value Date    ALT 9 02/04/2020    AST 19 02/04/2020    ALKPHOS 106 02/04/2020    BILITOTAL 0.7 02/04/2020     Lab Results   Component Value Date    BNP 109 (H) 02/04/2020      Lab Results   Component Value Date    TSH 2.25 02/01/2020     [name] is a very pleasant 74 y.o. lady with pulmonary hypertension, apical variant hypertrophic cardiomyopathy, and paroxysmal atrial fibrillation status post AVJ ablation.   1.  Hypertrophic cardiomyopathy.  I have requested records of her recent cardiac catheterization, genetic testing, and cardiac PET from [facility].  The most straightforward explanation for the apical aneurysm is progressive HCM, though an LAD infarct is also a possibility.  We discussed the apical aneurysm does carry a risk of thrombus, treatment with eliquis substantially reduces the risk.  Of note, [name] is concerned she may not have followed the PET diet as strictly as needed.    2.  Pulmonary hypertension.  [name] certainly has a component of elevated left sided pressures as supported by her recent cardiac catheterization as well as left atrial enlargement, though there is also a pre-capillary component.  I've asked [name] to think back about her symptoms and whether selexipag was beneficial.  The degree of hypoxia is striking to me, I can't attribute that to the mildly elevated left sided pressures.  My colleagues Drs. Shlobin and Brooke Dare have not found evidence of significant ILD.  I am concerned pulmonary vasodilator therapy has exacerbated her hypoxia.    3.  Paroxysmal atrial fibrillation.  Agree with plan for additional of atrial lead with Dr. Shelly Best.   4.  Possible AAA.  I suggested an ultrasound as a noninvasive low risk way to further assess, she was agreeable.   5.  Goals of care.  [name] met with Dr. Murtis Best during her recent hospitalization and found the discussion very helpful.  We talked briefly about aggressive resuscitation measures today, I told [name] that the chance of meaningful recover following cardiopulmonary arrest in her situation is extremely low.      Thank you very much for  allowing me to assist in the care of this delightful patient.  I have asked [name] to return to clinic in 4-6 weeks.  Of course, I would be happy to see [name] sooner should the need arise.  Please don't hesitate to contact me if you have any questions or concerns.    Sincerely,    Alba Destine MD  Advanced Heart Failure and Transplant Cardiology    I spent a total of 75 minutes on this patient's care on the day of their visit, excluding the time  spent related to any billed procedures.  This time includes face-to-face time with the patient as well as time spent reviewing records, independently interpreting studies, communicating with other health care professionals, coordinating care, and documenting in the medical record.      CC: Best, Despina Arias, MD  Keenan Bachelor MD  Barnabas Lister MD  Lilia Argue MD  Namon Cirri MD

## 2020-03-05 ENCOUNTER — Encounter (INDEPENDENT_AMBULATORY_CARE_PROVIDER_SITE_OTHER): Payer: Self-pay | Admitting: Clinical Cardiac Electrophysiology

## 2020-03-05 ENCOUNTER — Telehealth (INDEPENDENT_AMBULATORY_CARE_PROVIDER_SITE_OTHER): Payer: Self-pay

## 2020-03-05 NOTE — Telephone Encounter (Signed)
Spoke with patient, advised her to wash thoroughly with soap and water. She states the Hibiclens wash caused itching so bad she called EMS. She will not be using it again.

## 2020-03-05 NOTE — Telephone Encounter (Signed)
Patient [name] stating SG will be doing procedure tomorrow (dual PM). She was given medication to wash with and it caused a reaction (itching). She stopped using it and took benadryl. Asking if there is something else she can use as prep for procedure.    Call back number [phone].

## 2020-03-06 ENCOUNTER — Encounter: Admission: RE | Payer: Self-pay | Source: Ambulatory Visit

## 2020-03-06 ENCOUNTER — Encounter: Payer: Self-pay | Admitting: Pulmonary Disease

## 2020-03-06 ENCOUNTER — Telehealth (INDEPENDENT_AMBULATORY_CARE_PROVIDER_SITE_OTHER): Payer: Self-pay

## 2020-03-06 ENCOUNTER — Ambulatory Visit
Admission: RE | Admit: 2020-03-06 | Payer: Medicare Other | Source: Ambulatory Visit | Admitting: Clinical Cardiac Electrophysiology

## 2020-03-06 LAB — ECG 12-LEAD
Atrial Rate: 69 {beats}/min
Q-T Interval: 356 ms
QRS Duration: 90 ms
QTC Calculation (Bezet): 418 ms
R Axis: -43 degrees
T Axis: 125 degrees
Ventricular Rate: 83 {beats}/min

## 2020-03-06 SURGERY — PM GENERATOR CHANGE DUAL/BI-V
Anesthesia: Choice

## 2020-03-06 NOTE — Telephone Encounter (Signed)
Confirmed with pt of new address, appt time and date.     Pt verbalized understanding.

## 2020-03-07 ENCOUNTER — Ambulatory Visit (INDEPENDENT_AMBULATORY_CARE_PROVIDER_SITE_OTHER): Payer: Medicare Other | Admitting: Rheumatology

## 2020-03-07 ENCOUNTER — Encounter (INDEPENDENT_AMBULATORY_CARE_PROVIDER_SITE_OTHER): Payer: Self-pay | Admitting: Rheumatology

## 2020-03-07 ENCOUNTER — Other Ambulatory Visit (INDEPENDENT_AMBULATORY_CARE_PROVIDER_SITE_OTHER): Payer: Self-pay | Admitting: Rheumatology

## 2020-03-07 VITALS — BP 107/71 | HR 88 | Resp 16 | Ht 62.0 in | Wt 181.0 lb

## 2020-03-07 DIAGNOSIS — I270 Primary pulmonary hypertension: Secondary | ICD-10-CM

## 2020-03-07 DIAGNOSIS — R768 Other specified abnormal immunological findings in serum: Secondary | ICD-10-CM

## 2020-03-07 DIAGNOSIS — M1A9XX0 Chronic gout, unspecified, without tophus (tophi): Secondary | ICD-10-CM

## 2020-03-07 MED ORDER — COLCHICINE 0.6 MG PO CAPS
0.6000 mg | ORAL_CAPSULE | Freq: Every day | ORAL | 2 refills | Status: DC
Start: 2020-03-07 — End: 2020-03-07

## 2020-03-07 MED ORDER — ALLOPURINOL 100 MG PO TABS
100.0000 mg | ORAL_TABLET | Freq: Every day | ORAL | 2 refills | Status: AC
Start: 2020-03-07 — End: 2020-04-06

## 2020-03-07 MED ORDER — COLCHICINE 0.6 MG PO TABS
0.6000 mg | ORAL_TABLET | Freq: Every day | ORAL | 2 refills | Status: AC
Start: 2020-03-07 — End: 2020-04-06

## 2020-03-07 NOTE — Patient Instructions (Addendum)
Continue allopurinol 100mg  daily     Continue colchicine 0.6mg  daily    getbloodwork before next appointment     Please obtain the bloodwork and imaging ordered for you.     Please confirm with your insurance company that the ordered bloodwork and/or imaging is covered by your insurance, especially for the designated laboratory that the bloodwork is ordered to be drawn at.     We will discuss your results at your next appointment with me. Please set up that appointment on your way out. If there are any extraordinary abnormal results, you will hear from me or our staff here in the office.

## 2020-03-07 NOTE — Telephone Encounter (Addendum)
Communication with [facility] RE: this Rx request, per patient request.     Rx was sent today via eRx   Called to see what further information is needed. On hold for 20 mins+    Faxed below Rx with directions, refill, and quantity as requested. Pt aware and will contact us if she has any questions     Disp Refills Start End    Colchicine 0.6 MG capsule 30 capsule 2 03/07/2020 04/06/2020    Sig - Route: Take 1 capsule (0.6 mg total) by mouth daily - Oral    Sent to pharmacy as: Colchicine 0.6 MG capsule    Class: E-Rx    E-Prescribing Status: Receipt confirmed by pharmacy (03/07/2020 11:41 AM EST)

## 2020-03-07 NOTE — Progress Notes (Signed)
RHEUMATOLOGY NEW PATIENT  NOTE    Chief Complaint   Patient presents with    Consult (Initial)     Hospital discharge on 02/04/2020    Myositis associated ILD    Hand Pain     Lt hand, thumb pain today       PCP: Georgeanna Lea, MD    [name] is a 74 y.o. female who presents to the rheumatology clinic for new evaluation of ILD    HPI:  Has not seen pulmonary since discharge from hospital yet, due to see Dr. Brooke Dare  Has visited cardiology recently for hypertrophic cardiomyopathy, apical aneurysm, pulm HTN  Pt seeking to set up endocrinology follow up, also seeking another PCP   No worsening SOB since discharge  No new rashes    Still on allopurinol 100mg  daily  Tolerating it well  Taking colchicine 0.6mg  daily  No flares since discharge  Typically involves big toes  <5 flares/year    Red meat - denies  Shellfish -  Twice a month  Organ meats -  rarely  EtOH -  denies  Sodas/Juices -  Once in a while      Pt recently seen by inpt rheumatology team during hospitalization 02/2020, notes below:  74yo female with 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 Diagnosis of +ve SSA52/ myositis associated ILD, PAF s/p ablation, Hypertrtophic cardiomyopathy, HFrEF (w hypokinetic left side) on latest cardiac MRI, OSA- diag in 2014, gout. COVID in 06/2019 admittedon 10/27 for further w/up and management of her severe PAH.    Has a FH of  Systemic Lupus Erythematosus in sister, MS in another sister, and good pasteur's in cousin    ILD-  Previously followed at [facility]- Dr. Earlie Lou on MMF in 2019, then imuran since 04/2018.   On review of previous HRCT chest imaging- Did not have classic changes suggestive of ILD. --Negative for subpleural reticulation, traction bronchiectasis/bronchiolectasis, ground-glass, architectural distortion or honeycombing. Mild mosaic pulmonary parenchymal attenuation in the lower lobes with air trapping on expiratory phase imaging... (on scan from 2020). Also most recent HRCT chest here is  completely unremarkable for ILD.   wonder if the ? mosaic attenuation previously was more fluid.     Clinically nohistory or currentevidence of muscle weakness/ inflammatory arthralgia's/ sicca symptoms/ raynaud's.   Reviewed previous lab results. +ve ANA with SSA52-(low positive on LC myositis panel). Also aldolase 8.9 with normal CPK from 2019. Note of +ve RF but was only 14.1.   Also on exam- normal 5/5 muscle strength diffusely, and normal nail fold capillaries.    Labs here-   +ve ANA here, w ribosomal P ab. (false positive). This is usually associated w CNS Systemic Lupus Erythematosus and not ILD.Marland KitchenCPK/ aldolase normal.  myositis panel pending.    Azathioprine stopped.    Gout- last attack 1-2 months ago. UA 8.7.   Now that imuran is stopped- incease allopurinol to 100mg /day- recheck UA levels in 4-6 weeks- and adjust allopurinol dose to goal UA less than 6.  Continue colchicine 0.6mg  daily for prophylaxis in interim.       Continue Phtn/ HFrEF Treatment per [facility] team/ cardiology and EP.     ROS:  Review of Systems   Constitutional: Negative for fever.   Eyes: Negative for visual disturbance.   Respiratory: Positive for shortness of breath. Negative for cough.    Cardiovascular: Negative for chest pain.   Gastrointestinal: Negative for diarrhea.   Genitourinary: Negative for hematuria.  Musculoskeletal: Negative for arthralgias, back pain, joint swelling and myalgias.   Skin: Negative for color change and rash.   Neurological: Negative for weakness.          The following sections were reviewed this encounter by the provider:        PMH/PSH:  Past Medical History:   Diagnosis Date    A-fib     Cardiomyopathy     CKD (chronic kidney disease)     Congestive heart failure     COPD (chronic obstructive pulmonary disease)     COVID-19     Disorder of thyroid     Gastrointestinal hemorrhage     HTN (hypertension)     NSTEMI (non-ST elevated myocardial infarction)     During LHC     Obesity     OSA on CPAP     PNA (pneumonia)         Past Surgical History:   Procedure Laterality Date    CARDIAC ABLATION      A-FIB ABLATION    CESAREAN SECTION      INSERT / REPLACE / REMOVE PACEMAKER      RIGHT HEART CATH ONLY INCL. CO AND SATS Right 01/24/2020    Procedure: RIGHT HEART CATH w/ exercise;  Surgeon: Burna Forts, MD;  Location: [facility];  Service: Cardiovascular;  Laterality: Right;  same day discharge        FH/SH:  Family History   Problem Relation Age of Onset    Alzheimer's disease Mother     Lung cancer Father        Social History     Tobacco Use    Smoking status: Former Smoker     Years: 20.00     Types: Cigarettes    Smokeless tobacco: Never Used    Tobacco comment: stopped 20 years ago   Vaping Use    Vaping Use: Never used   Substance Use Topics    Alcohol use: Never    Drug use: Never        Meds/ Allergies:  Outpatient Medications Marked as Taking for the 03/07/20 encounter (Office Visit) with Verdis Frederickson, MD   Medication Sig Dispense Refill    allopurinol (ZYLOPRIM) 100 MG tablet Take 1 tablet (100 mg total) by mouth daily Per pt taking 50mg  (half tab) 30 tablet 2    apixaban (ELIQUIS) 5 MG Take 1 tablet (5 mg total) by mouth every 12 (twelve) hours 60 tablet 11    atorvastatin (LIPITOR) 80 MG tablet Take 80 mg by mouth daily      Colchicine 0.6 MG capsule Take 1 capsule (0.6 mg total) by mouth daily 30 capsule 2    ezetimibe (ZETIA) 10 MG tablet Take 10 mg by mouth daily      ferrous sulfate 325 (65 FE) MG tablet Take 325 mg by mouth Once every Monday, Wednesday and Friday morning Taking on Monday wed, friday         furosemide (LASIX) 40 MG tablet Take 1 tablet (40 mg total) by mouth 2 (two) times daily (Patient taking differently: Take 40 mg by mouth daily   ) 60 tablet 0    levothyroxine (SYNTHROID) 100 MCG tablet Take 50 mcg by mouth Once a day at 6:00am         Multiple Vitamin (multivitamin) capsule Take 1 capsule by mouth daily       OXYGEN-HELIUM IN Inhale 4-6 L/min into the lungs  pantoprazole (PROTONIX) 40 MG tablet Take 40 mg by mouth daily      potassium chloride (K-DUR) 10 MEQ tablet Take 10 mEq by mouth      Selexipag (Uptravi) 1600 MCG Tab Take 1,600 mcg by mouth 2 (two) times daily      spironolactone (ALDACTONE) 25 MG tablet Take 1 tablet (25 mg total) by mouth 2 (two) times daily (Patient taking differently: Take 25 mg by mouth daily   ) 60 tablet 0    vitamin D (CHOLECALCIFEROL) 25 MCG (1000 UT) tablet Take 2,000 Units by mouth daily      [DISCONTINUED] allopurinol (ZYLOPRIM) 100 MG tablet Take 1 tablet (100 mg total) by mouth daily Per pt taking 50mg  (half tab) (Patient taking differently: Take 100 mg by mouth daily   ) 30 tablet 0    [DISCONTINUED] Colchicine 0.6 MG capsule Take 0.6 mg by mouth daily       Allergies   Allergen Reactions    Amiodarone Other (See Comments)     Lung toxicity    Cellcept [Mycophenolate]      Gastro hemorrhages     Zyban [Bupropion] Itching          PHYSICAL EXAM  Vitals:    03/07/20 1113   BP: 107/71   Pulse: 88   Resp: 16   SpO2: 99%        General- WNWD, alert and oriented, NAD  Eyes- no conjunctival injection, no scleral icterus  ENMT- face symmetric without lesions  CV- nml s1s2 without murmurs  Resp- nml effort, CTA bilat, no wheezes or crackles   Skin- no rash, no alopecia, no nail pitting, no onycholysis   Ext- no peripheral edema, no clubbing, no cyanosis    MSK:        Neck-normal flexion, extension, and rotation of cervical spine  Shoulders-normal   Elbows-normal ROM, no warmth  Wrists- normal ROM, no warmth    Left Hand - exam as per homunculus  Right Hand - exam as per homunculus    Knees- no crepitus, warmth, Normal ROM.     Ankles- normal flexion and extension, inversion and eversion.   Feet- no warmth.    Neuro: strength 5/5 in b/l upper/lower extremities, no difference in proximal vs. Distal muscles, neck flexors 5/5, able to stand from seated position without  difficulty    LABS:  Lab Results   Component Value Date    WBC 5.32 03/02/2020    HGB 13.1 03/02/2020    HCT 42.5 03/02/2020    MCV 103.4 (H) 03/02/2020    PLT 248 03/02/2020      Lab Results   Component Value Date    CREAT 1.1 03/02/2020    BUN 19.0 03/02/2020    NA 137 03/02/2020    K 5.2 (H) 03/02/2020    CL 110 03/02/2020    CO2 14 (L) 03/02/2020      Lab Results   Component Value Date    ALT 26 03/02/2020    AST 21 03/02/2020    ALKPHOS 125 (H) 03/02/2020    BILITOTAL 0.8 03/02/2020     No results found for: ESR   No results found for: CRP   Lab Results   Component Value Date    ANA Positive (A) 02/01/2020    ANA 1:640 (A) 02/01/2020    ANA Speckled (A) 02/01/2020    ANA 6 02/01/2020    ANA Negative 02/01/2020    ANA 11 02/01/2020  ANA Negative 02/01/2020    ANA 73 02/01/2020    ANA Negative 02/01/2020    ANA Negative 02/01/2020    ANA 7 02/01/2020    ANA Negative 02/01/2020    ANA 71 02/01/2020    ANA Negative 02/01/2020    ANA 3 02/01/2020    ANA Negative 02/01/2020    ANA 1.2 (H) 02/01/2020    ANA Weak Positive (A) 02/01/2020    ANA 6 02/01/2020    ANA Negative 02/01/2020    ANA 39 (H) 02/01/2020    ANA Weak Positive (A) 02/01/2020    ANA 6 02/01/2020    ANA Negative 02/01/2020    ANA 4 02/01/2020    ANA Negative 02/01/2020    ANA 6 02/01/2020    ANA Negative 02/01/2020    ANA 9 02/01/2020    ANA See Note 02/01/2020      Lab Results   Component Value Date    RHEUMFACTOR <13.0 02/01/2020      Lab Results   Component Value Date    CCP <15.6 02/01/2020     Lab Results   Component Value Date    URICACID 8.7 (H) 02/03/2020     MYOSITIS Panel 2021  JO-1 AB              <11       SI <11   PL-7 AB              <11       SI <11   PL-12 AB             <11       SI <11   EJ AB               <11       SI <11   OJ AB               <11       SI <11   SRP AB              <11       SI <11   MI-2 ALPHA  AB           <11       SI <11   MI-2 BETA AB           <11       SI <11   MDA-5 AB             <11       SI <11   TIF-1y AB             <11       SI <11   NXP-2 AB             <11       SI <11     RADIOLOGY:  CTA Chest 2021  1. No evidence of acute or chronic pulmonary embolism.  2. Dilated pulmonary arteries consistent with pulmonary hypertension.  Mosaic perfusion throughout the lungs is consistent with pulmonary  Hypertension.    Echo 2021    * The left ventricle is normal in size.    * Left ventricular wall thickness is severely increased, particularly  towards the mid cavity and apex consistent with mid/apical hypertrophic  cardiomyopathy. 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    * Left ventricular systolic function is mildly decreased with an ejection  fraction by Biplane Method of Discs of  50 %.    * Left ventricular diastolic filling parameters are consistent with Grade II  diastolic dysfunction (pseudonormal pattern) with elevated left atrial  pressure.    * There is diastolic mital regurgitation consistent with AV dissociation.    * The right ventricular cavity size is normal in size.    * Moderately-to-severely decreased right ventricular systolic function  (TAPSE 1.0 cm, 3D RVEF 28% and FAC 24%).    * The left atrium is severely dilated.    * The right atrium is dilated.    * There is moderate tricuspid regurgitation.    * Severe pulmonary hypertension with estimated right ventricular systolic  pressure of  87 mmHg.    * No prior study is available for comparison.    CT Chest 2021   CT of the chest with high resolution imaging shows no  evidence of significant pulmonary interstitial lung disease or pulmonary  fibrosis; pulmonary groundglass opacity, seen on prior expiratory  imaging, is no longer demonstrated. Cardiomegaly is unchanged. There is  no definite pneumonia or CHF. The examination shows mildly  enlarged  mediastinal lymph nodes which are grossly unchanged from prior study.  Coronary artery calcifications are noted. Other findings as noted above.    I have reviewed the patient's latest radiology results.    ASSESSMENT/PLAN:  74yo female with 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 Diagnosis of +ve SSA52/ myositis associated ILD, PAF s/p ablation, Hypertrtophic cardiomyopathy, HFrEF (w hypokinetic left side) on latest cardiac MRI, OSA- diag in 2014, gout. COVID in 06/2019. Admitted 01/2020 for worsening PAH.    ILD-  Previously followed at [facility]- Dr. Earlie Lou on MMF in 2019, then imuran since 04/2018.   Myositis panel neg, no significant prox muscle weakness, inflammatory arthralgia's/ sicca symptoms/ raynaud's  Hx of +ve ANA with SSA52-(low positive on LC myositis panel), +ve RF but was only 14.1.   +ve ANA here, w ribosomal P ab. (false positive). This is usually associated w CNS Systemic Lupus Erythematosus and not ILD.CPK/ aldolase normal.  On review of previous HRCT chest imaging- no clear ILD, in association with ALD, AZA was stopped 01/2020  Continue to monitor off AZA as PAH primary concern  Continue Phtn/ HFrEF Treatment per [facility] team/ cardiology and EP.     Gout- 3/4 times a year  Continue allopurinol 100mg  daily, colchicine 0.6mg  daily  No recent flares  bloodwork before next visit  Imaging to eval for any erosions  tx for UA<6      1. Chronic gout without tophus, unspecified cause, unspecified site  - CBC and differential; Future  - Comprehensive metabolic panel; Future  - Uric acid; Future  - XR Foot Right AP And Lateral; Future  - XR Foot Left AP And Lateral; Future    2. Pulmonary hypertension, primary    3. Positive ANA (antinuclear antibody)       FOLLOW UP:  Return in about 2 months (around 05/08/2020).      Delmer Islam, MD   Rheumatologist  [address]   [place]   [address]  [place], [place] [address]  P [phone]  F [phone]      41 min office visit was spent counseling  / co-ordination of care with the patient on one or more of the following:-  Chart / Record review  Disease state - discussion about the medical condition, diagnostic, treatment options and long term prognosis  Medications - indications and side effects   Lifestyle issues  as appropriate.

## 2020-03-07 NOTE — Addendum Note (Signed)
Addended by: Delmer Islam B on: 03/07/2020 04:17 PM     Modules accepted: Orders

## 2020-03-08 ENCOUNTER — Other Ambulatory Visit: Payer: Self-pay

## 2020-03-08 ENCOUNTER — Ambulatory Visit
Admission: RE | Admit: 2020-03-08 | Discharge: 2020-03-08 | Disposition: A | Discharge status: Home / Self Care | Payer: Self-pay | Source: Ambulatory Visit

## 2020-03-09 ENCOUNTER — Other Ambulatory Visit: Payer: Self-pay

## 2020-03-09 ENCOUNTER — Telehealth: Payer: Medicare Other

## 2020-03-09 ENCOUNTER — Encounter: Payer: Self-pay | Admitting: Cardiovascular Disease

## 2020-03-09 ENCOUNTER — Ambulatory Visit: Admission: RE | Admit: 2020-03-09 | Payer: Self-pay | Source: Ambulatory Visit

## 2020-03-15 ENCOUNTER — Encounter: Admission: RE | Disposition: E | Payer: Self-pay | Source: Home / Self Care | Attending: Critical Care Medicine

## 2020-03-15 ENCOUNTER — Ambulatory Visit: Payer: Medicare Other | Admitting: Anesthesiology

## 2020-03-15 ENCOUNTER — Encounter: Payer: Self-pay | Admitting: Pulmonary Disease

## 2020-03-15 ENCOUNTER — Encounter: Payer: Self-pay | Admitting: Cardiovascular Disease

## 2020-03-15 ENCOUNTER — Ambulatory Visit: Payer: Medicare Other

## 2020-03-15 ENCOUNTER — Inpatient Hospital Stay
Admission: RE | Admit: 2020-03-15 | Discharge: 2020-04-07 | DRG: 981 | Disposition: E | Payer: Medicare Other | Attending: Pulmonary Disease | Admitting: Pulmonary Disease

## 2020-03-15 DIAGNOSIS — I480 Paroxysmal atrial fibrillation: Secondary | ICD-10-CM | POA: Diagnosis present

## 2020-03-15 DIAGNOSIS — J9621 Acute and chronic respiratory failure with hypoxia: Principal | ICD-10-CM | POA: Diagnosis present

## 2020-03-15 DIAGNOSIS — M109 Gout, unspecified: Secondary | ICD-10-CM | POA: Diagnosis present

## 2020-03-15 DIAGNOSIS — I50811 Acute right heart failure: Secondary | ICD-10-CM | POA: Diagnosis present

## 2020-03-15 DIAGNOSIS — J9691 Respiratory failure, unspecified with hypoxia: Secondary | ICD-10-CM

## 2020-03-15 DIAGNOSIS — I442 Atrioventricular block, complete: Secondary | ICD-10-CM

## 2020-03-15 DIAGNOSIS — Z79899 Other long term (current) drug therapy: Secondary | ICD-10-CM

## 2020-03-15 DIAGNOSIS — I2721 Secondary pulmonary arterial hypertension: Secondary | ICD-10-CM | POA: Diagnosis present

## 2020-03-15 DIAGNOSIS — R570 Cardiogenic shock: Secondary | ICD-10-CM | POA: Diagnosis not present

## 2020-03-15 DIAGNOSIS — G9349 Other encephalopathy: Secondary | ICD-10-CM | POA: Diagnosis present

## 2020-03-15 DIAGNOSIS — S01502A Unspecified open wound of oral cavity, initial encounter: Secondary | ICD-10-CM | POA: Diagnosis not present

## 2020-03-15 DIAGNOSIS — D539 Nutritional anemia, unspecified: Secondary | ICD-10-CM | POA: Diagnosis present

## 2020-03-15 DIAGNOSIS — Z66 Do not resuscitate: Secondary | ICD-10-CM | POA: Diagnosis present

## 2020-03-15 DIAGNOSIS — I130 Hypertensive heart and chronic kidney disease with heart failure and stage 1 through stage 4 chronic kidney disease, or unspecified chronic kidney disease: Secondary | ICD-10-CM | POA: Diagnosis present

## 2020-03-15 DIAGNOSIS — R040 Epistaxis: Secondary | ICD-10-CM | POA: Diagnosis not present

## 2020-03-15 DIAGNOSIS — I270 Primary pulmonary hypertension: Secondary | ICD-10-CM

## 2020-03-15 DIAGNOSIS — I50810 Right heart failure, unspecified: Secondary | ICD-10-CM | POA: Diagnosis not present

## 2020-03-15 DIAGNOSIS — D8989 Other specified disorders involving the immune mechanism, not elsewhere classified: Secondary | ICD-10-CM | POA: Diagnosis present

## 2020-03-15 DIAGNOSIS — E872 Acidosis: Secondary | ICD-10-CM | POA: Diagnosis present

## 2020-03-15 DIAGNOSIS — Z45010 Encounter for checking and testing of cardiac pacemaker pulse generator [battery]: Secondary | ICD-10-CM

## 2020-03-15 DIAGNOSIS — E039 Hypothyroidism, unspecified: Secondary | ICD-10-CM | POA: Diagnosis present

## 2020-03-15 DIAGNOSIS — X58XXXA Exposure to other specified factors, initial encounter: Secondary | ICD-10-CM | POA: Diagnosis not present

## 2020-03-15 DIAGNOSIS — I252 Old myocardial infarction: Secondary | ICD-10-CM

## 2020-03-15 DIAGNOSIS — Z7901 Long term (current) use of anticoagulants: Secondary | ICD-10-CM

## 2020-03-15 DIAGNOSIS — Y92239 Unspecified place in hospital as the place of occurrence of the external cause: Secondary | ICD-10-CM | POA: Diagnosis not present

## 2020-03-15 DIAGNOSIS — I272 Pulmonary hypertension, unspecified: Secondary | ICD-10-CM | POA: Diagnosis present

## 2020-03-15 DIAGNOSIS — I5032 Chronic diastolic (congestive) heart failure: Secondary | ICD-10-CM | POA: Diagnosis present

## 2020-03-15 DIAGNOSIS — Z87891 Personal history of nicotine dependence: Secondary | ICD-10-CM

## 2020-03-15 DIAGNOSIS — R1310 Dysphagia, unspecified: Secondary | ICD-10-CM | POA: Diagnosis not present

## 2020-03-15 DIAGNOSIS — Z8616 Personal history of COVID-19: Secondary | ICD-10-CM

## 2020-03-15 DIAGNOSIS — I499 Cardiac arrhythmia, unspecified: Secondary | ICD-10-CM

## 2020-03-15 DIAGNOSIS — I4891 Unspecified atrial fibrillation: Secondary | ICD-10-CM | POA: Diagnosis present

## 2020-03-15 DIAGNOSIS — J9601 Acute respiratory failure with hypoxia: Secondary | ICD-10-CM | POA: Diagnosis not present

## 2020-03-15 DIAGNOSIS — N183 Chronic kidney disease, stage 3 unspecified: Secondary | ICD-10-CM | POA: Diagnosis present

## 2020-03-15 DIAGNOSIS — E870 Hyperosmolality and hypernatremia: Secondary | ICD-10-CM | POA: Diagnosis not present

## 2020-03-15 DIAGNOSIS — N179 Acute kidney failure, unspecified: Secondary | ICD-10-CM | POA: Diagnosis not present

## 2020-03-15 DIAGNOSIS — J690 Pneumonitis due to inhalation of food and vomit: Secondary | ICD-10-CM | POA: Diagnosis not present

## 2020-03-15 DIAGNOSIS — I253 Aneurysm of heart: Secondary | ICD-10-CM | POA: Diagnosis present

## 2020-03-15 DIAGNOSIS — G4733 Obstructive sleep apnea (adult) (pediatric): Secondary | ICD-10-CM | POA: Diagnosis present

## 2020-03-15 DIAGNOSIS — Z45018 Encounter for adjustment and management of other part of cardiac pacemaker: Secondary | ICD-10-CM

## 2020-03-15 DIAGNOSIS — E875 Hyperkalemia: Secondary | ICD-10-CM | POA: Diagnosis not present

## 2020-03-15 DIAGNOSIS — J449 Chronic obstructive pulmonary disease, unspecified: Secondary | ICD-10-CM | POA: Diagnosis present

## 2020-03-15 DIAGNOSIS — Z79890 Hormone replacement therapy (postmenopausal): Secondary | ICD-10-CM

## 2020-03-15 DIAGNOSIS — Z515 Encounter for palliative care: Secondary | ICD-10-CM

## 2020-03-15 DIAGNOSIS — I422 Other hypertrophic cardiomyopathy: Secondary | ICD-10-CM | POA: Diagnosis present

## 2020-03-15 DIAGNOSIS — I071 Rheumatic tricuspid insufficiency: Secondary | ICD-10-CM | POA: Diagnosis present

## 2020-03-15 DIAGNOSIS — Z950 Presence of cardiac pacemaker: Secondary | ICD-10-CM

## 2020-03-15 DIAGNOSIS — R918 Other nonspecific abnormal finding of lung field: Secondary | ICD-10-CM | POA: Diagnosis not present

## 2020-03-15 DIAGNOSIS — I517 Cardiomegaly: Secondary | ICD-10-CM | POA: Diagnosis not present

## 2020-03-15 LAB — CBC AND DIFFERENTIAL
Absolute NRBC: 0 10*3/uL (ref 0.00–0.00)
Basophils Absolute Automated: 0.04 10*3/uL (ref 0.00–0.08)
Basophils Automated: 0.5 %
Eosinophils Absolute Automated: 0.06 10*3/uL (ref 0.00–0.44)
Eosinophils Automated: 0.8 %
Hematocrit: 37.4 % (ref 34.7–43.7)
Hgb: 11.7 g/dL (ref 11.4–14.8)
Immature Granulocytes Absolute: 0.03 10*3/uL (ref 0.00–0.07)
Immature Granulocytes: 0.4 %
Lymphocytes Absolute Automated: 0.75 10*3/uL (ref 0.42–3.22)
Lymphocytes Automated: 9.5 %
MCH: 31.9 pg (ref 25.1–33.5)
MCHC: 31.3 g/dL — ABNORMAL LOW (ref 31.5–35.8)
MCV: 101.9 fL — ABNORMAL HIGH (ref 78.0–96.0)
MPV: 10.4 fL (ref 8.9–12.5)
Monocytes Absolute Automated: 0.76 10*3/uL (ref 0.21–0.85)
Monocytes: 9.6 %
Neutrophils Absolute: 6.29 10*3/uL (ref 1.10–6.33)
Neutrophils: 79.2 %
Nucleated RBC: 0 /100 WBC (ref 0.0–0.0)
Platelets: 249 10*3/uL (ref 142–346)
RBC: 3.67 10*6/uL — ABNORMAL LOW (ref 3.90–5.10)
RDW: 20 % — ABNORMAL HIGH (ref 11–15)
WBC: 7.93 10*3/uL (ref 3.10–9.50)

## 2020-03-15 LAB — COMPREHENSIVE METABOLIC PANEL
ALT: 25 U/L (ref 0–55)
AST (SGOT): 26 U/L (ref 5–34)
Albumin/Globulin Ratio: 1.1 (ref 0.9–2.2)
Albumin: 3.7 g/dL (ref 3.5–5.0)
Alkaline Phosphatase: 146 U/L — ABNORMAL HIGH (ref 37–106)
Anion Gap: 13 (ref 5.0–15.0)
BUN: 19 mg/dL (ref 7.0–19.0)
Bilirubin, Total: 0.5 mg/dL (ref 0.2–1.2)
CO2: 16 mEq/L — ABNORMAL LOW (ref 22–29)
Calcium: 8.9 mg/dL (ref 7.9–10.2)
Chloride: 111 mEq/L (ref 100–111)
Creatinine: 1.1 mg/dL — ABNORMAL HIGH (ref 0.6–1.0)
Globulin: 3.4 g/dL (ref 2.0–3.6)
Glucose: 113 mg/dL — ABNORMAL HIGH (ref 70–100)
Potassium: 4.9 mEq/L (ref 3.5–5.1)
Protein, Total: 7.1 g/dL (ref 6.0–8.3)
Sodium: 140 mEq/L (ref 136–145)

## 2020-03-15 LAB — GFR: EGFR: 58.6

## 2020-03-15 LAB — BLOOD GAS, ARTERIAL
Arterial Total CO2: 18.4 mEq/L — ABNORMAL LOW (ref 24.0–30.0)
Base Excess, Arterial: -8.1 mEq/L — ABNORMAL LOW (ref <=2.0)
HCO3, Arterial: 17.3 mEq/L — ABNORMAL LOW (ref 23.0–29.0)
O2 Sat, Arterial: 99.2 % (ref 95.0–100.0)
Temperature: 36.6
pCO2, Arterial: 36 mmHg (ref 35.0–45.0)
pH, Arterial: 7.3 — ABNORMAL LOW (ref 7.350–7.450)
pO2, Arterial: 180 mmHg — ABNORMAL HIGH (ref 80.0–90.0)

## 2020-03-15 LAB — GLUCOSE WHOLE BLOOD - POCT: Whole Blood Glucose POCT: 187 mg/dL — ABNORMAL HIGH (ref 70–100)

## 2020-03-15 LAB — MAGNESIUM: Magnesium: 1.7 mg/dL (ref 1.6–2.6)

## 2020-03-15 SURGERY — PM GENERATOR CHANGE SINGLE
Anesthesia: Anesthesia General

## 2020-03-15 MED ORDER — DEXMEDETOMIDINE HCL IN NACL 400 MCG/100ML IV SOLN
0.0000 ug/kg/h | INTRAVENOUS | Status: DC
Start: 2020-03-15 — End: 2020-03-16
  Administered 2020-03-15: 22:00:00 1.4 ug/kg/h via INTRAVENOUS
  Administered 2020-03-15: 19:00:00 0.5 ug/kg/h via INTRAVENOUS
  Administered 2020-03-16: 03:00:00 1 ug/kg/h via INTRAVENOUS
  Filled 2020-03-15 (×2): qty 100

## 2020-03-15 MED ORDER — LIDOCAINE HCL (PF) 1 % IJ SOLN
INTRAMUSCULAR | Status: AC
Start: 2020-03-15 — End: ?
  Filled 2020-03-15: qty 30

## 2020-03-15 MED ORDER — LIDOCAINE 4 % EX CREA
TOPICAL_CREAM | Freq: Once | CUTANEOUS | Status: DC | PRN
Start: 2020-03-15 — End: 2020-03-15

## 2020-03-15 MED ORDER — VASOPRESSIN 20 UNIT/ML IV SOLN
INTRAVENOUS | Status: AC
Start: 2020-03-15 — End: ?
  Filled 2020-03-15: qty 1

## 2020-03-15 MED ORDER — NEOSTIGMINE METHYLSULFATE 1 MG/ML IJ/IV SOLN (WRAP)
Status: DC | PRN
Start: 2020-03-15 — End: 2020-03-15
  Administered 2020-03-15: 5 mg via INTRAVENOUS

## 2020-03-15 MED ORDER — SUGAMMADEX SODIUM 200 MG/2ML IV SOLN
INTRAVENOUS | Status: DC | PRN
Start: 2020-03-15 — End: 2020-03-15
  Administered 2020-03-15: 200 mg via INTRAVENOUS

## 2020-03-15 MED ORDER — FENTANYL CITRATE (PF) 50 MCG/ML IJ SOLN (WRAP)
INTRAMUSCULAR | Status: DC | PRN
Start: 2020-03-15 — End: 2020-03-15
  Administered 2020-03-15 (×2): 50 ug via INTRAVENOUS

## 2020-03-15 MED ORDER — ROCURONIUM BROMIDE 10 MG/ML IV SOLN (WRAP)
INTRAVENOUS | Status: DC | PRN
Start: 2020-03-15 — End: 2020-03-15
  Administered 2020-03-15: 50 mg via INTRAVENOUS

## 2020-03-15 MED ORDER — PHENYLEPHRINE 100 MCG/ML IV SYRINGE FOR INFUSION (ANESTHESIA)
PREFILLED_SYRINGE | INTRAVENOUS | Status: DC | PRN
Start: 2020-03-15 — End: 2020-03-15
  Administered 2020-03-15: 30 ug/min via INTRAVENOUS

## 2020-03-15 MED ORDER — FENTANYL CITRATE (PF) 50 MCG/ML IJ SOLN (WRAP)
50.0000 ug | Freq: Once | INTRAMUSCULAR | Status: AC
Start: 2020-03-15 — End: 2020-03-15
  Filled 2020-03-15: qty 2

## 2020-03-15 MED ORDER — VELETRI 1.5 MG/60 ML IN NS INHALATION (CNR)
0.3000 mg/h | RESPIRATORY_TRACT | Status: DC
Start: 2020-03-15 — End: 2020-03-16
  Administered 2020-03-15: 19:00:00 0.2 mg/h via RESPIRATORY_TRACT
  Administered 2020-03-15 – 2020-03-16 (×3): 0.3 mg/h via RESPIRATORY_TRACT
  Filled 2020-03-15 (×4): qty 60

## 2020-03-15 MED ORDER — LIDOCAINE HCL (PF) 2 % IJ SOLN
INTRAMUSCULAR | Status: AC
Start: 2020-03-15 — End: ?
  Filled 2020-03-15: qty 5

## 2020-03-15 MED ORDER — SODIUM CHLORIDE 0.9 % IV SOLN
INTRAVENOUS | Status: DC | PRN
Start: 2020-03-15 — End: 2020-03-15
  Administered 2020-03-15: 17:00:00 5 ug/min via INTRAVENOUS

## 2020-03-15 MED ORDER — NEOSTIGMINE METHYLSULFATE 1 MG/ML IJ/IV SOLN (WRAP)
Status: AC
Start: 2020-03-15 — End: ?
  Filled 2020-03-15: qty 5

## 2020-03-15 MED ORDER — VASOPRESSIN 20 UNIT/ML IV SOLN
INTRAVENOUS | Status: DC | PRN
Start: 2020-03-15 — End: 2020-03-15
  Administered 2020-03-15 (×2): 1 [IU] via INTRAVENOUS

## 2020-03-15 MED ORDER — CARBOXYMETHYLCELLULOSE SODIUM 0.5 % OP SOLN
1.0000 [drp] | OPHTHALMIC | Status: DC | PRN
Start: 2020-03-15 — End: 2020-03-16

## 2020-03-15 MED ORDER — CEFUROXIME SODIUM 1.5 G IJ/IV SOLR (WRAP)
Status: DC | PRN
Start: 2020-03-15 — End: 2020-03-15
  Administered 2020-03-15: 1.5 g via INTRAVENOUS

## 2020-03-15 MED ORDER — LABETALOL HCL 5 MG/ML IV SOLN (WRAP)
INTRAVENOUS | Status: DC | PRN
Start: 2020-03-15 — End: 2020-03-15
  Administered 2020-03-15: 5 mg via INTRAVENOUS

## 2020-03-15 MED ORDER — ONDANSETRON HCL 4 MG/2ML IJ SOLN
INTRAMUSCULAR | Status: DC | PRN
Start: 2020-03-15 — End: 2020-03-15
  Administered 2020-03-15: 4 mg via INTRAVENOUS

## 2020-03-15 MED ORDER — CEFUROXIME SODIUM 1.5 G IJ/IV SOLR (WRAP)
Status: AC
Start: 2020-03-15 — End: ?
  Filled 2020-03-15: qty 1500

## 2020-03-15 MED ORDER — LIDOCAINE HCL 1 % IJ SOLN
1.0000 mL | Freq: Once | INTRAMUSCULAR | Status: DC | PRN
Start: 2020-03-15 — End: 2020-03-15

## 2020-03-15 MED ORDER — ONDANSETRON HCL 4 MG/2ML IJ SOLN
INTRAMUSCULAR | Status: AC
Start: 2020-03-15 — End: ?
  Filled 2020-03-15: qty 2

## 2020-03-15 MED ORDER — FENTANYL CITRATE (PF) 50 MCG/ML IJ SOLN (WRAP)
INTRAMUSCULAR | Status: AC
Start: 2020-03-15 — End: 2020-03-15
  Administered 2020-03-15: 19:00:00 50 ug via INTRAVENOUS
  Filled 2020-03-15: qty 2

## 2020-03-15 MED ORDER — SENNOSIDES-DOCUSATE SODIUM 8.6-50 MG PO TABS
1.0000 | ORAL_TABLET | Freq: Two times a day (BID) | ORAL | Status: DC
Start: 2020-03-15 — End: 2020-03-24
  Administered 2020-03-16 – 2020-03-20 (×4): 1 via ORAL
  Filled 2020-03-15 (×11): qty 1

## 2020-03-15 MED ORDER — NOREPINEPHRINE 8 MG/100 ML (SIMPLE)
0.0000 ug/min | Status: DC
Start: 2020-03-15 — End: 2020-03-16

## 2020-03-15 MED ORDER — SUCCINYLCHOLINE CHLORIDE 20 MG/ML IJ SOLN
INTRAMUSCULAR | Status: AC
Start: 2020-03-15 — End: ?
  Filled 2020-03-15: qty 5

## 2020-03-15 MED ORDER — LACTATED RINGERS IV SOLN
INTRAVENOUS | Status: DC
Start: 2020-03-15 — End: 2020-03-15

## 2020-03-15 MED ORDER — LIDOCAINE HCL 1 % IJ SOLN
INTRAMUSCULAR | Status: AC | PRN
Start: 2020-03-15 — End: 2020-03-15
  Administered 2020-03-15: 6 mL via SUBCUTANEOUS

## 2020-03-15 MED ORDER — GLYCOPYRROLATE 0.2 MG/ML IJ SOLN (WRAP)
INTRAMUSCULAR | Status: AC
Start: 2020-03-15 — End: ?
  Filled 2020-03-15: qty 4

## 2020-03-15 MED ORDER — GLYCOPYRROLATE 0.2 MG/ML IJ SOLN (WRAP)
INTRAMUSCULAR | Status: AC
Start: 2020-03-15 — End: ?
  Filled 2020-03-15: qty 1

## 2020-03-15 MED ORDER — LEVOTHYROXINE SODIUM 50 MCG PO TABS
50.0000 ug | ORAL_TABLET | Freq: Every day | ORAL | Status: DC
Start: 2020-03-16 — End: 2020-03-26
  Administered 2020-03-17 – 2020-03-25 (×9): 50 ug via ORAL
  Filled 2020-03-15 (×9): qty 1

## 2020-03-15 MED ORDER — FENTANYL CITRATE (PF) 50 MCG/ML IJ SOLN (WRAP)
INTRAMUSCULAR | Status: AC
Start: 2020-03-15 — End: ?
  Filled 2020-03-15: qty 2

## 2020-03-15 MED ORDER — PHENYLEPHRINE 100 MCG/ML IV SOSY (WRAP)
PREFILLED_SYRINGE | INTRAVENOUS | Status: AC
Start: 2020-03-15 — End: ?
  Filled 2020-03-15: qty 10

## 2020-03-15 MED ORDER — LUBRIFRESH P.M. OP OINT
TOPICAL_OINTMENT | OPHTHALMIC | Status: DC | PRN
Start: 2020-03-15 — End: 2020-03-16

## 2020-03-15 MED ORDER — PROPOFOL 10 MG/ML IV EMUL (WRAP)
INTRAVENOUS | Status: DC | PRN
Start: 2020-03-15 — End: 2020-03-15
  Administered 2020-03-15 (×4): 50 mg via INTRAVENOUS

## 2020-03-15 MED ORDER — PROPOFOL INFUSION 10 MG/ML
0.0000 ug/kg/min | INTRAVENOUS | Status: DC
Start: 2020-03-15 — End: 2020-03-16
  Administered 2020-03-16: 30 ug/kg/min via INTRAVENOUS
  Filled 2020-03-15: qty 100

## 2020-03-15 MED ORDER — PROPOFOL 10 MG/ML IV EMUL (WRAP)
INTRAVENOUS | Status: AC
Start: 2020-03-15 — End: 2020-03-15
  Administered 2020-03-15: 19:00:00 20 ug/kg/min via INTRAVENOUS
  Filled 2020-03-15: qty 100

## 2020-03-15 MED ORDER — FENTANYL CITRATE (PF) 50 MCG/ML IJ SOLN (WRAP)
50.0000 ug | INTRAMUSCULAR | Status: DC | PRN
Start: 2020-03-15 — End: 2020-03-16
  Administered 2020-03-15 – 2020-03-16 (×6): 50 ug via INTRAVENOUS
  Filled 2020-03-15 (×4): qty 2

## 2020-03-15 MED ORDER — GLYCOPYRROLATE 0.2 MG/ML IJ SOLN (WRAP)
INTRAMUSCULAR | Status: DC | PRN
Start: 2020-03-15 — End: 2020-03-15
  Administered 2020-03-15: .8 mg via INTRAVENOUS
  Administered 2020-03-15: .1 mg via INTRAVENOUS

## 2020-03-15 MED ORDER — LABETALOL HCL 5 MG/ML IV SOLN (WRAP)
INTRAVENOUS | Status: AC
Start: 2020-03-15 — End: ?
  Filled 2020-03-15: qty 4

## 2020-03-15 MED ORDER — ROCURONIUM BROMIDE 50 MG/5ML IV SOLN
INTRAVENOUS | Status: AC
Start: 2020-03-15 — End: ?
  Filled 2020-03-15: qty 5

## 2020-03-15 MED ORDER — LIDOCAINE HCL 2 % IJ SOLN
INTRAMUSCULAR | Status: DC | PRN
Start: 2020-03-15 — End: 2020-03-15
  Administered 2020-03-15: 100 mg via INTRAVENOUS

## 2020-03-15 MED ORDER — FAMOTIDINE 10 MG/ML IV SOLN (WRAP)
INTRAVENOUS | Status: DC | PRN
Start: 2020-03-15 — End: 2020-03-15
  Administered 2020-03-15: 20 mg via INTRAVENOUS

## 2020-03-15 MED ORDER — EPINEPHRINE 10 MCG/ML IV IN NS SYRINGE LOW DOSE (PEDS)
INJECTION | INTRAVENOUS | Status: DC | PRN
Start: 2020-03-15 — End: 2020-03-15
  Administered 2020-03-15 (×2): .1 mg via INTRAVENOUS
  Administered 2020-03-15: .2 mg via INTRAVENOUS
  Administered 2020-03-15: .1 mg via INTRAVENOUS
  Administered 2020-03-15: .2 mg via INTRAVENOUS
  Administered 2020-03-15: .1 mg via INTRAVENOUS
  Administered 2020-03-15 (×2): .2 mg via INTRAVENOUS
  Administered 2020-03-15: .1 mg via INTRAVENOUS
  Administered 2020-03-15 (×3): .2 mg via INTRAVENOUS

## 2020-03-15 SURGICAL SUPPLY — 19 items
ENV TYRX ABSRBL ANTBCT LRG (Cover) ×1 IMPLANT
ENVELOPE ABS POLYARYLATE MINOCYCLINE (Cover) ×1 IMPLANT
ENVELOPE ABSORBABLE L3.3 IN X W2.9 IN (Cover) ×1 IMPLANT
ENVELOPE ABSORBABLE L3.3 IN X W2.9 IN LARGE ANTIBACTERIAL TYRX (Cover) ×1 IMPLANT
INTRO INPUT SSV SPLIT 7FX13CM (Sheaths) ×1
INTRO S-MK 7CM KIT 4F NT/PT (Introducer) ×1
INTRODUCER SHEATH OD7 FR VALVE SIDEPORT (Sheaths) ×1
INTRODUCER SHEATH OD7 FR VALVE SIDEPORT STAINLESS STEEL (Sheaths) ×1 IMPLANT
INTRODUCER SHTH SS 7FR LF VLV SDPRT (Sheaths) ×1
KIT INTRO NTNL PLTN .018IN 40CM MERIT (Introducer) ×1
KIT INTRODUCER L10 CM .018 IN L40 CM (Introducer) ×1
KIT INTRODUCER L10 CM .018 IN L40 CM STIFFEN DILATOR GUIDEWIRE COIL (Introducer) ×1 IMPLANT
LEAD NOVUS CAPSUREFIX 45CM (Pacemaker) ×1 IMPLANT
LEAD PACING VENTRICLE L45 CM OD6.2 FR ODSEC2 MM 10 MM SPACE SMALL (Pacemaker) ×1 IMPLANT
LEAD PCNG STRD ELUT PLTN SIL 10MM SPC SM (Pacemaker) ×2 IMPLANT
PACEMAKER 22.5 GM AZURE XT DR MRI (Pacemaker) ×1 IMPLANT
PACEMAKER 22.5 GM AZURE XT DR MRI SURESCAN D7.4 MM W50.8 MM X H46.6 MM (Pacemaker) ×1 IMPLANT
PACEMAKER AZUR XT DR MRI SRSCN (Pacemaker) ×1 IMPLANT
PACEMAKER CRD TI PU SIL RBR 7.4MM AZURE (Pacemaker) ×1 IMPLANT
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
## 2020-03-15 NOTE — Discharge Instructions (Signed)
Interventional Cardiovascular Admission and Recovery  EP Discharge Instructions  Pacemaker and/or ICD Device Implant    ACTIVITY:  1. Do not drive until instructed by your doctor.  Discuss at your follow-up appointment.  2. Rest today and tomorrow, gradually increasing to your usual activities.  3. Limited activities for 4 weeks.   a. Do not raise your arm on the incision side above shoulder level. This gives the device lead wires time to attach securely inside your body.  b. No lifting anything greater than 10 pounds on the incision side.  4. Ask your doctor when you can return to work.    If you received anesthesia:   Common side effects after anesthesia:    Nausea and vomiting   Sore throat or hoarseness (usually temporary)    Urinary retention - If you are experiencing difficulty urinating, please call your physician or go to the nearest emergency room.    Managing nausea  Some people have an upset stomach after the procedure/surgery. This is often because of anesthesia, pain, or pain medicine, or the stress of the procedure/surgery. These tips may help you handle nausea:   Don't push yourself to eat. Your body will tell you when to eat and how much.   Start off with clear liquids and soup. They are easier to digest. Then progress to solid foods as tolerated.   If pain medications were prescribed, they may upset your stomach (It may help to take with some food).    WOUND/INCISION CARE:  1. If the bandage was not removed in the hospital, REMOVE it within 24 hours of the procedure.  Do not remove the steri-strips. They will be removed at your follow-up visit.  2. You may shower daily keeping the incision area dry.  After 5 - 7 days you may gently wash the incision with soap and water and pat dry.  Do not submerge the incision site in water (bath tub, pool, etc) until it is completely healed.    3. If Dermabond was used to close the incision, you may shower daily.  See Dermabond instructions below.  4. Do not  apply any creams, powders, lotions or ointments to the site/s.  5. Do not rub, scrub, pick, scratch or even use a wash cloth over the site/s.  6. Observe for signs of infection:  redness, warmth, swelling, drainage, or temperature greater than 100.4 degrees F.  If you suspect infection call the doctor who performed the procedure.  7. You may have some tenderness.  May take ACETAMINOPHEN (Tylenol) if needed.  8. You may experience some mild bruising.  9. If you notice bleeding either at the incision site or underneath the skin, you should lay down flat and hold pressure on the area for 10 minutes and call your doctor.  If the bleeding does not stop, you should continue to hold pressure and call 9-1-1.  10. If your arm becomes cold, numb, painful, grayish in color, or change from usual color/sensation occurs, you should call 9-1-1.    WHEN TO CALL THE DOCTOR:  (Call the doctor who placed your device)       1.  If you have signs of infection       2.  Dizziness, fainting       3. Chest pain, rapid pulse or shortness of breath       4. Chest muscle twitching or hiccups that won't stop       5. Unrelieved pain    ADDITIONAL   INFORMATION:       1.  You will be given an ID card that contains information about your pacemaker.  Initially it will be a temporary card. A permanent card will be mailed to you within a few weeks.  Always   carry this card with you.       2.  Antibiotics - May need pre-treatment for prevention of infection prior to any elective surgical or dental procedure.  Discuss with your doctor.       3. Keep your cell phone away from your device.  Don't carry the phone in your shirt pocket, even when it's turned off.       4. Avoid strong magnets.  Examples are those used in MRIs or in hand-held security wands.       5. Refer to the device instruction manual for further instruction.          Skin adhesive (Dermabond)  You have an incision which has been closed with a type of skin glue.   Keep the incision  clean and dry. You may shower or bathe as usual, but do not use soaps, lotions, or ointments on the incision area. Do not scrub the area. After bathing, pat the area dry with a soft clean towel.   Do not scratch, rub, or pick at the film. Do not place tape directly over the film.   Do not apply liquids (such as peroxide), ointments, or creams to the incision area while the film is in place.   Most skin incisions heal without problems. However, an infection sometimes occurs despite proper treatment.    The skin glue film will fall off naturally in 5 to 10 days.

## 2020-03-15 NOTE — Plan of Care (Signed)
Assumed patient care at 1900. Patient remains intubated, weaning FiO2 to maintain O2 saturation >92. Bilateral soft wrist restraints ordered and applied to maintain safety while intubated. All safety measures implemented. Patient's caregiver was at bedside for 30 minutes and was updated regarding plan of care for patient.     Weaned sedation throughout the night and treated pain and agitation with PRN fentanyl.     Acute event:  0315: Patient arousable, following, grimacing to pain when drawing blood specimen for AM labs.   0340: Patient became obtunded, unable to arouse. Propofol and Precedex gtts stopped immediately. PA Berzon called to bedside.  Pupils, size 3, equal, and reactive. ABG and head CT ordered. Vital signs shown below.     03/16/20 0340   Vitals   Heart Rate 69   Pulse (SpO2) 70   Resp Rate 16   BP 102/57   MAP (mmHg) 77   Oxygen Therapy   SpO2 96 %   FiO2 50 %     0506: Patient arousable and woke up on CT table and following commands.   0530: Transferred back to CICU, patient still arousable but slow to respond. Pupils, size 3, equal and reactive. 40mg  IV lasix and lactic acid level ordered.    Patient's vital signs stable throughout event and validated and flowsheet.             Problem: Non-Violent Restraints Interdisciplinary Plan  Goal: Will be injury free during the use of non-violent restraints  03-27-2020 2324 by Herminio Heads, RN  Outcome: Progressing  Flowsheets (Taken 2020-03-27 2324)  Will be injury free during the use of non-violent restraints:   Attempt all alternatives before use of restraints   Initiate least restrictive type of restraint that is effective   Provide and maintain safe environment   Notify family of initiation of restraints   Include patient/family/caregiver in decisions related to safety   Ensure safety devices are properly applied and maintained   Document observed patient actions according to protocol   Nurse to accompany patient off unit when on restraints    Remove restraints before the indicated maximum length of time when meets criteria for discontinuation   Document significant changes in patient condition   Provide debriefing as soon as possible and appropriate   Ensure that order for restraints has not expired   Reassess need for continued restraints  March 27, 2020 2323 by Herminio Heads, RN  Outcome: Progressing     Problem: Compromised Tissue integrity  Goal: Damaged tissue is healing and protected  March 27, 2020 2324 by Herminio Heads, RN  Outcome: Progressing  Flowsheets (Taken 03/27/2020 2324)  Damaged tissue is healing and protected:   Monitor/assess Braden scale every shift   Provide wound care per wound care algorithm   Reposition patient every 2 hours and as needed unless able to reposition self   Increase activity as tolerated/progressive mobility   Avoid shearing injuries   Relieve pressure to bony prominences for patients at moderate and high risk   Use bath wipes, not soap and water, for daily bathing   Keep intact skin clean and dry   Use incontinence wipes for cleaning urine, stool and caustic drainage. Foley care as needed   Monitor external devices/tubes for correct placement to prevent pressure, friction and shearing   Monitor patient's hygiene practices   Encourage use of lotion/moisturizer on skin   Consult/collaborate with wound care nurse   Utilize specialty bed  03/27/20 2323 by Herminio Heads, RN  Outcome: Progressing  Goal:  Nutritional status is improving  04-02-2020 2324 by Herminio Heads, RN  Outcome: Progressing  Flowsheets (Taken 2020-04-02 2324)  Nutritional status is improving:   Encourage patient to take dietary supplement(s) as ordered   Include patient/patient care companion in decisions related to nutrition  04-02-2020 2323 by Herminio Heads, RN  Outcome: Progressing
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
## 2020-03-15 NOTE — Progress Notes (Signed)
Pre-Procedure Teaching and Learning Objectives   Learner: [name],   Preference for learning: Verbal  Teaching Method: Verbal Instruction   Outcome of Learning: Fully Achieved    Described/Demonstrated the following:     + Responsibilities of patient's care  + PM gen change with addition of atrial lead   + Purpose of procedure  + Need to be NPO pre-procedure  + Need for maintaining bedrest & straight leg post-procedure & sheath removal.  + Necessary fluid intake after procedure  + Symptoms of bleeding & states plan to notify nurse    Received pt into [facility] 01 for PM gen change with addition of atrial lead with Dr. Hart Rochester. Pt call bell within reach. RN to continue to monitor.

## 2020-03-15 NOTE — Procedures (Signed)
Pacemaker Upgrade to Dual Chamber Procedure Note  Indication: pacemaker syndrome    After written, informed consent was obtained the patient was brought to the Electrophysiology Laboratory, prepped and draped in the usual sterile manner. A member of the anesthesia team  provided conscious sedation during the procedure.    Pocket Formation  Prior to the start of the procedure, cefuroxime IV was given intravenously. Following infiltration with xylocaine, an incision was made directly over the underlying device. Using sharp dissection, cautery and blunt dissection, the incision was extended down to the device.  The device was removed from the pocket and lead was freed of adhesions.  The a pocket was then fashioned caudad and sized to the device. Hemostasis was then obtained using cautery.    Venous Access  Under direct fluoroscopic guidance, the axillary vein was entered once.    Atrial Lead Placement  A 7 Fr sheath was inserted into the vein using the modified Seldinger technique. The lead was placed into the introducer and advanced to the right atrium under fluoroscopy where mapping was performed to find a site with adequate sensing. After the active fixation helix was extended with fluoroscopic confirmation, stimulation and sensing thresholds were found to be satisfactory. The lead was then checked at 10V output, and no diaphragm stimulation was noted. The lead was sutured to the underlying tissue using its suturing sleeve and non-absorbable suture.    Device Placement and Closure  The pocket was inspected for foreign bodies, then irrigated copiously with normal saline solution. It was then inspected again for hemostasis. The leads were placed into the connector assuring correct positioning, and the screws were tightened. The leads were tugged upon to be sure the connection was secure. The generator was inserted into the pocket with excess lead behind it. It was not sutured to the underlying tissue with a  non-absorbable suture.    The pocket was then closed in multiple layers of absorbable suture, steri-strips were applied, and a sterile dressing was placed over the incision. The sponge and needle counts were correct. Fluoroscopy of the device pocket, and no evidence of any foreign object (gauze, suture, etc.) was noted. The patient was taken to the post-procedure area in stable condition.    Dual chamber pacemaker upgrade (Medtronic)  Mode:  DDD 60-130 bpm, paced and sensed AV delay 180 ms and 150 ms respectively     RA lead  Threshold: atrial flutter  Sensing: 2.6 mV  Impedance: 608 Ohms    RV lead  Threshold: 0.75 V @ 0.4 ms  Sensing: 20 mV  Impedance: 532 Ohms    Recommendations  Post-implant recommendations:  1. Avoid vascular access through the left jugular and subclavian veins.  2. Patient may shower.  3. No baths, pools, hot tubs for one month.  4. CXR today to evaluate for PNX and lead dislodgement.  5. Restart DOAC tomorrow. Restart all oral medications. Avoid IV or SQ heparin for at least two weeks. If heparin must be used, please contact procedure attending to discuss.    Follow up/discharge recommendations:  1. Device interrogation in 1-2 weeks.  2. Contact EP office with any questions or concerns.  3. For urgent matters after hours or on weekends, contact the cardiologist on-call.    Wound care:  1. Check your incision for the following (call our office if you notice): redness, drainage (pus or blood), swelling, warmth, or if you develop fever.  2. If you have questions regarding your wound care, please contact our  office.
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
## 2020-03-15 NOTE — Anesthesia Postprocedure Evaluation (Addendum)
Anesthesia Post Evaluation    Patient: [name]    Procedure(s) with comments:  PM GENERATOR CHANGE SINGLE - SAME DAY D/C/MEDTRONIC    Anesthesia type: general    Last Vitals:   Vitals Value Taken Time   BP 124/71 03/24/2020 1841   Temp  04/01/2020 1842   Pulse 69 04/01/2020 1841   Resp 26 03/25/2020 1841   SpO2 73 % 03/23/2020 1841   Vitals shown include unvalidated device data.              Anesthesia Post Evaluation:     Patient Evaluated: ICU  Patient Participation: complete - patient cannot participate  Level of Consciousness: responsive to noxious stimuli    Pain Management: adequate    Airway Patency: patent    Anesthetic complications: No      PONV Status: none    Cardiovascular status: acceptable  Respiratory status: ETT and ventilator  Hydration status: acceptable        Signed by: Marlowe Aschoff, MD, 03/25/2020 6:42 PM

## 2020-03-15 NOTE — Progress Notes (Signed)
Admitted from EP lab @ 1830 intubated on 100% on levophed @ 57mcg/min. Patient O2 sats were 70s and MD at bedside. Inhaled veletri started @ 0.3. Patient alert, awake and following commands but agitated. fent given x3 without effect. Dex gtt started and maxed out per [facility]. Prop gtt then added and now patient sedated. Unable to draw labs r/t poor access. Able to wean off levophed gtt. Bedside echo done and no pneumo or tamponade.
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
## 2020-03-15 NOTE — Transfer of Care (Signed)
Anesthesia Transfer of Care Note    Patient: [name]    Procedures performed: Procedure(s) with comments:  PM GENERATOR CHANGE SINGLE - SAME DAY D/C/[employer]    Anesthesia type: General ETT    Patient location:Phase I PACU    Last vitals:   Vitals:    2020-03-29 1402   BP: 117/82   Pulse: 80   Resp: 14   Temp: 36.2 C (97.2 F)   SpO2: 97%       Post pain: Patient not complaining of pain, continue current therapy      Mental Status:intubated    Respiratory Function: intubated    Cardiovascular: ongoing hemodynamic instability    Nausea/Vomiting: patient not complaining of nausea or vomiting    Hydration Status: adequate    Post assessment: no apparent anesthetic complications and will follow up    Signed by: Marlowe Aschoff, MD  29-Mar-2020 6:41 PM

## 2020-03-15 NOTE — H&P (Signed)
The history and physical currently available in the chart has been reviewed and there are no significant interval changes since prior evaluation.  She has no new complaints.  She was seen and examined by me prior to the procedure.  The risks, benefits and alternatives of the procedure have been discussed in detail and she has indicated that she understands the procedure, indications, and risks inherent to the procedure and expresses wishes to proceed.  All questions were sought and answered. Informed consent was signed and verified.  She has held her DOAC for 3 days. Proceed with upgrade to dual chamber pacemaker.

## 2020-03-16 ENCOUNTER — Ambulatory Visit: Payer: Medicare Other

## 2020-03-16 ENCOUNTER — Inpatient Hospital Stay (HOSPITAL_COMMUNITY): Payer: Medicare Other

## 2020-03-16 ENCOUNTER — Other Ambulatory Visit (HOSPITAL_COMMUNITY): Payer: Self-pay

## 2020-03-16 DIAGNOSIS — Z136 Encounter for screening for cardiovascular disorders: Secondary | ICD-10-CM

## 2020-03-16 DIAGNOSIS — Q211 Atrial septal defect: Secondary | ICD-10-CM

## 2020-03-16 DIAGNOSIS — I480 Paroxysmal atrial fibrillation: Secondary | ICD-10-CM

## 2020-03-16 DIAGNOSIS — Z8616 Personal history of COVID-19: Secondary | ICD-10-CM

## 2020-03-16 DIAGNOSIS — I5030 Unspecified diastolic (congestive) heart failure: Secondary | ICD-10-CM

## 2020-03-16 DIAGNOSIS — G9340 Encephalopathy, unspecified: Secondary | ICD-10-CM

## 2020-03-16 DIAGNOSIS — E875 Hyperkalemia: Secondary | ICD-10-CM

## 2020-03-16 DIAGNOSIS — D8989 Other specified disorders involving the immune mechanism, not elsewhere classified: Secondary | ICD-10-CM

## 2020-03-16 DIAGNOSIS — I2721 Secondary pulmonary arterial hypertension: Secondary | ICD-10-CM

## 2020-03-16 DIAGNOSIS — Z45010 Encounter for checking and testing of cardiac pacemaker pulse generator [battery]: Secondary | ICD-10-CM

## 2020-03-16 DIAGNOSIS — G4733 Obstructive sleep apnea (adult) (pediatric): Secondary | ICD-10-CM

## 2020-03-16 DIAGNOSIS — J690 Pneumonitis due to inhalation of food and vomit: Secondary | ICD-10-CM | POA: Diagnosis not present

## 2020-03-16 DIAGNOSIS — N183 Chronic kidney disease, stage 3 unspecified: Secondary | ICD-10-CM | POA: Diagnosis present

## 2020-03-16 DIAGNOSIS — G9349 Other encephalopathy: Secondary | ICD-10-CM | POA: Diagnosis present

## 2020-03-16 DIAGNOSIS — I130 Hypertensive heart and chronic kidney disease with heart failure and stage 1 through stage 4 chronic kidney disease, or unspecified chronic kidney disease: Secondary | ICD-10-CM | POA: Diagnosis present

## 2020-03-16 DIAGNOSIS — D72829 Elevated white blood cell count, unspecified: Secondary | ICD-10-CM | POA: Diagnosis not present

## 2020-03-16 DIAGNOSIS — I50810 Right heart failure, unspecified: Secondary | ICD-10-CM | POA: Diagnosis not present

## 2020-03-16 DIAGNOSIS — R0602 Shortness of breath: Secondary | ICD-10-CM | POA: Diagnosis not present

## 2020-03-16 DIAGNOSIS — Z789 Other specified health status: Secondary | ICD-10-CM | POA: Diagnosis not present

## 2020-03-16 DIAGNOSIS — I253 Aneurysm of heart: Secondary | ICD-10-CM | POA: Diagnosis present

## 2020-03-16 DIAGNOSIS — I422 Other hypertrophic cardiomyopathy: Secondary | ICD-10-CM | POA: Diagnosis not present

## 2020-03-16 DIAGNOSIS — I272 Pulmonary hypertension, unspecified: Secondary | ICD-10-CM | POA: Diagnosis not present

## 2020-03-16 DIAGNOSIS — Z45018 Encounter for adjustment and management of other part of cardiac pacemaker: Secondary | ICD-10-CM | POA: Diagnosis not present

## 2020-03-16 DIAGNOSIS — E870 Hyperosmolality and hypernatremia: Secondary | ICD-10-CM | POA: Diagnosis not present

## 2020-03-16 DIAGNOSIS — R579 Shock, unspecified: Secondary | ICD-10-CM | POA: Diagnosis not present

## 2020-03-16 DIAGNOSIS — Z87891 Personal history of nicotine dependence: Secondary | ICD-10-CM | POA: Diagnosis not present

## 2020-03-16 DIAGNOSIS — S1120XA Unspecified open wound of pharynx and cervical esophagus, initial encounter: Secondary | ICD-10-CM | POA: Diagnosis not present

## 2020-03-16 DIAGNOSIS — I50813 Acute on chronic right heart failure: Secondary | ICD-10-CM | POA: Diagnosis not present

## 2020-03-16 DIAGNOSIS — J189 Pneumonia, unspecified organism: Secondary | ICD-10-CM | POA: Diagnosis not present

## 2020-03-16 DIAGNOSIS — I270 Primary pulmonary hypertension: Secondary | ICD-10-CM | POA: Diagnosis not present

## 2020-03-16 DIAGNOSIS — M109 Gout, unspecified: Secondary | ICD-10-CM | POA: Diagnosis present

## 2020-03-16 DIAGNOSIS — J9811 Atelectasis: Secondary | ICD-10-CM | POA: Diagnosis not present

## 2020-03-16 DIAGNOSIS — I509 Heart failure, unspecified: Secondary | ICD-10-CM | POA: Diagnosis not present

## 2020-03-16 DIAGNOSIS — I517 Cardiomegaly: Secondary | ICD-10-CM | POA: Diagnosis not present

## 2020-03-16 DIAGNOSIS — J811 Chronic pulmonary edema: Secondary | ICD-10-CM | POA: Diagnosis not present

## 2020-03-16 DIAGNOSIS — I5032 Chronic diastolic (congestive) heart failure: Secondary | ICD-10-CM | POA: Diagnosis present

## 2020-03-16 DIAGNOSIS — J9621 Acute and chronic respiratory failure with hypoxia: Secondary | ICD-10-CM | POA: Diagnosis not present

## 2020-03-16 DIAGNOSIS — I4891 Unspecified atrial fibrillation: Secondary | ICD-10-CM | POA: Diagnosis not present

## 2020-03-16 DIAGNOSIS — I252 Old myocardial infarction: Secondary | ICD-10-CM | POA: Diagnosis not present

## 2020-03-16 DIAGNOSIS — R4182 Altered mental status, unspecified: Secondary | ICD-10-CM | POA: Diagnosis not present

## 2020-03-16 DIAGNOSIS — J9601 Acute respiratory failure with hypoxia: Secondary | ICD-10-CM | POA: Diagnosis not present

## 2020-03-16 DIAGNOSIS — I519 Heart disease, unspecified: Secondary | ICD-10-CM | POA: Diagnosis not present

## 2020-03-16 DIAGNOSIS — J449 Chronic obstructive pulmonary disease, unspecified: Secondary | ICD-10-CM | POA: Diagnosis present

## 2020-03-16 DIAGNOSIS — R0902 Hypoxemia: Secondary | ICD-10-CM | POA: Diagnosis not present

## 2020-03-16 DIAGNOSIS — E8770 Fluid overload, unspecified: Secondary | ICD-10-CM | POA: Diagnosis not present

## 2020-03-16 DIAGNOSIS — R609 Edema, unspecified: Secondary | ICD-10-CM | POA: Diagnosis not present

## 2020-03-16 DIAGNOSIS — Z7901 Long term (current) use of anticoagulants: Secondary | ICD-10-CM | POA: Diagnosis not present

## 2020-03-16 DIAGNOSIS — Z515 Encounter for palliative care: Secondary | ICD-10-CM | POA: Diagnosis not present

## 2020-03-16 DIAGNOSIS — N179 Acute kidney failure, unspecified: Secondary | ICD-10-CM | POA: Diagnosis not present

## 2020-03-16 DIAGNOSIS — Z66 Do not resuscitate: Secondary | ICD-10-CM | POA: Diagnosis present

## 2020-03-16 DIAGNOSIS — I50811 Acute right heart failure: Secondary | ICD-10-CM | POA: Diagnosis not present

## 2020-03-16 DIAGNOSIS — J90 Pleural effusion, not elsewhere classified: Secondary | ICD-10-CM | POA: Diagnosis not present

## 2020-03-16 DIAGNOSIS — E038 Other specified hypothyroidism: Secondary | ICD-10-CM | POA: Diagnosis not present

## 2020-03-16 DIAGNOSIS — I071 Rheumatic tricuspid insufficiency: Secondary | ICD-10-CM | POA: Diagnosis not present

## 2020-03-16 DIAGNOSIS — R0600 Dyspnea, unspecified: Secondary | ICD-10-CM | POA: Diagnosis not present

## 2020-03-16 DIAGNOSIS — E872 Acidosis: Secondary | ICD-10-CM | POA: Diagnosis not present

## 2020-03-16 LAB — ECHOCARDIOGRAM ADULT WITH CONTRAST COMP W CLR/DOP
AV Area (Cont Eq VTI): 1.766
AV Area (Cont Eq VTI): 1.77
AV Mean Gradient: 2
AV Peak Velocity: 108
Ao Root Diameter (2D): 2.8
BP Mod LV Ejection Fraction: 44.8
IVS Diastolic Thickness (2D): 1.05
LA Dimension (2D): 4.9
LA Volume Index (BP A-L): 0.046
LVID diastole (2D): 3.95
LVID systole (2D): 2.95
MV Area (PHT): 4.215
MV E/e' (Average): 14.063
Mitral Valve Findings: NORMAL
Prox Ascending Aorta Diameter: 3
Pulmonary Valve Findings: NORMAL
RV Basal Diastolic Dimension: 3.13
RV Basal Diastolic Dimension: 3.82
RV Systolic Pressure: 84.556
RV Systolic Pressure: 85
TAPSE: 0.695
TAPSE: 0.869
Tricuspid Valve Findings: NORMAL

## 2020-03-16 LAB — CBC AND DIFFERENTIAL
Absolute NRBC: 0 10*3/uL (ref 0.00–0.00)
Basophils Absolute Automated: 0.02 10*3/uL (ref 0.00–0.08)
Basophils Automated: 0.2 %
Eosinophils Absolute Automated: 0.01 10*3/uL (ref 0.00–0.44)
Eosinophils Automated: 0.1 %
Hematocrit: 35.2 % (ref 34.7–43.7)
Hgb: 10.9 g/dL — ABNORMAL LOW (ref 11.4–14.8)
Immature Granulocytes Absolute: 0.04 10*3/uL (ref 0.00–0.07)
Immature Granulocytes: 0.4 %
Lymphocytes Absolute Automated: 0.38 10*3/uL — ABNORMAL LOW (ref 0.42–3.22)
Lymphocytes Automated: 4 %
MCH: 32.1 pg (ref 25.1–33.5)
MCHC: 31 g/dL — ABNORMAL LOW (ref 31.5–35.8)
MCV: 103.5 fL — ABNORMAL HIGH (ref 78.0–96.0)
MPV: 10.3 fL (ref 8.9–12.5)
Monocytes Absolute Automated: 0.71 10*3/uL (ref 0.21–0.85)
Monocytes: 7.6 %
Neutrophils Absolute: 8.23 10*3/uL — ABNORMAL HIGH (ref 1.10–6.33)
Neutrophils: 87.7 %
Nucleated RBC: 0 /100 WBC (ref 0.0–0.0)
Platelets: 197 10*3/uL (ref 142–346)
RBC: 3.4 10*6/uL — ABNORMAL LOW (ref 3.90–5.10)
RDW: 20 % — ABNORMAL HIGH (ref 11–15)
WBC: 9.39 10*3/uL (ref 3.10–9.50)

## 2020-03-16 LAB — BASIC METABOLIC PANEL
Anion Gap: 12 (ref 5.0–15.0)
BUN: 20 mg/dL — ABNORMAL HIGH (ref 7.0–19.0)
CO2: 14 mEq/L — ABNORMAL LOW (ref 22–29)
Calcium: 8.8 mg/dL (ref 7.9–10.2)
Chloride: 110 mEq/L (ref 100–111)
Creatinine: 1.1 mg/dL — ABNORMAL HIGH (ref 0.6–1.0)
Glucose: 131 mg/dL — ABNORMAL HIGH (ref 70–100)
Potassium: 5.5 mEq/L — ABNORMAL HIGH (ref 3.5–5.1)
Sodium: 136 mEq/L (ref 136–145)

## 2020-03-16 LAB — LACTIC ACID, PLASMA
Lactic Acid: 1.6 mmol/L (ref 0.2–2.0)
Lactic Acid: 3.4 mmol/L — ABNORMAL HIGH (ref 0.2–2.0)

## 2020-03-16 LAB — MAGNESIUM: Magnesium: 2 mg/dL (ref 1.6–2.6)

## 2020-03-16 LAB — BLOOD GAS, ARTERIAL
Arterial Total CO2: 19.1 mEq/L — ABNORMAL LOW (ref 24.0–30.0)
Base Excess, Arterial: -7.8 mEq/L — ABNORMAL LOW (ref <=2.0)
FIO2: 59 %
HCO3, Arterial: 17.9 mEq/L — ABNORMAL LOW (ref 23.0–29.0)
O2 Sat, Arterial: 91.5 % — ABNORMAL LOW (ref 95.0–100.0)
PEEP: 6
Rate: 14 {beats}/min
Temperature: 36.7
Tidal vol.: 420
pCO2, Arterial: 38.8 mmHg (ref 35.0–45.0)
pH, Arterial: 7.285 — ABNORMAL LOW (ref 7.350–7.450)
pO2, Arterial: 68.5 mmHg — ABNORMAL LOW (ref 80.0–90.0)

## 2020-03-16 LAB — MRSA CULTURE
Culture MRSA Surveillance: NEGATIVE
Culture MRSA Surveillance: NEGATIVE

## 2020-03-16 LAB — PHOSPHORUS: Phosphorus: 5.2 mg/dL — ABNORMAL HIGH (ref 2.3–4.7)

## 2020-03-16 LAB — ECG 12-LEAD
Atrial Rate: 264 {beats}/min
P Axis: 88 degrees
Q-T Interval: 440 ms
QRS Duration: 96 ms
QTC Calculation (Bezet): 475 ms
R Axis: -48 degrees
T Axis: 178 degrees
Ventricular Rate: 70 {beats}/min

## 2020-03-16 LAB — GFR: EGFR: 58.6

## 2020-03-16 LAB — B-TYPE NATRIURETIC PEPTIDE: B-Natriuretic Peptide: 186 pg/mL — ABNORMAL HIGH (ref 0–100)

## 2020-03-16 MED ORDER — FUROSEMIDE 10 MG/ML IJ SOLN
40.0000 mg | Freq: Once | INTRAMUSCULAR | Status: AC
Start: 2020-03-16 — End: 2020-03-16
  Administered 2020-03-16: 18:00:00 40 mg via INTRAVENOUS
  Filled 2020-03-16: qty 4

## 2020-03-16 MED ORDER — PERFLUTREN PROTEIN A MICROSPH IV SUSP
3.0000 mL | Freq: Once | INTRAVENOUS | Status: AC
Start: 2020-03-16 — End: 2020-03-16
  Administered 2020-03-16: 15:00:00 3 mL via INTRAVENOUS
  Filled 2020-03-16: qty 3

## 2020-03-16 MED ORDER — FAMOTIDINE 10 MG/ML IV SOLN (WRAP)
20.0000 mg | INTRAVENOUS | Status: DC
Start: 2020-03-16 — End: 2020-03-17
  Filled 2020-03-16: qty 2

## 2020-03-16 MED ORDER — SODIUM BICARBONATE 650 MG PO TABS
1300.0000 mg | ORAL_TABLET | Freq: Four times a day (QID) | ORAL | Status: DC
Start: 2020-03-16 — End: 2020-03-18
  Administered 2020-03-16 – 2020-03-18 (×6): 1300 mg via ORAL
  Filled 2020-03-16 (×6): qty 2

## 2020-03-16 MED ORDER — BENZOCAINE-MENTHOL 15-3.6 MG MT LOZG
1.0000 | LOZENGE | OROMUCOSAL | Status: DC | PRN
Start: 2020-03-16 — End: 2020-03-26
  Administered 2020-03-16: 1 via BUCCAL
  Filled 2020-03-16: qty 1

## 2020-03-16 MED ORDER — HEPARIN SODIUM (PORCINE) 5000 UNIT/ML IJ SOLN
5000.0000 [IU] | Freq: Three times a day (TID) | INTRAMUSCULAR | Status: DC
Start: 2020-03-16 — End: 2020-03-16
  Administered 2020-03-16: 06:00:00 5000 [IU] via SUBCUTANEOUS
  Filled 2020-03-16: qty 1

## 2020-03-16 MED ORDER — FUROSEMIDE 10 MG/ML IJ SOLN
40.0000 mg | Freq: Once | INTRAMUSCULAR | Status: AC
Start: 2020-03-16 — End: 2020-03-16
  Administered 2020-03-16: 06:00:00 40 mg via INTRAVENOUS
  Filled 2020-03-16: qty 4

## 2020-03-16 MED ORDER — APIXABAN 5 MG PO TABS
5.0000 mg | ORAL_TABLET | Freq: Two times a day (BID) | ORAL | Status: DC
Start: 2020-03-16 — End: 2020-03-25
  Administered 2020-03-16 – 2020-03-25 (×19): 5 mg via ORAL
  Filled 2020-03-16 (×19): qty 1

## 2020-03-16 MED ORDER — MAGNESIUM SULFATE IN D5W 1-5 GM/100ML-% IV SOLN
1.0000 g | Freq: Once | INTRAVENOUS | Status: AC
Start: 2020-03-16 — End: 2020-03-16
  Administered 2020-03-16: 02:00:00 1 g via INTRAVENOUS
  Filled 2020-03-16: qty 100

## 2020-03-16 MED ORDER — SELEXIPAG 200 MCG PO TABS
1600.0000 ug | ORAL_TABLET | Freq: Two times a day (BID) | ORAL | Status: DC
Start: 2020-03-16 — End: 2020-03-17
  Administered 2020-03-16 – 2020-03-17 (×2): 1600 ug via ORAL
  Filled 2020-03-16 (×4): qty 8

## 2020-03-16 MED ORDER — FUROSEMIDE 10 MG/ML IJ SOLN
80.0000 mg | Freq: Once | INTRAMUSCULAR | Status: AC
Start: 2020-03-17 — End: 2020-03-16
  Administered 2020-03-16: 80 mg via INTRAVENOUS
  Filled 2020-03-16: qty 8

## 2020-03-16 MED ORDER — FUROSEMIDE 40 MG PO TABS: 40.0000 mg | ORAL_TABLET | Freq: Every day | ORAL | 3 refills | Status: DC

## 2020-03-16 NOTE — Progress Notes (Signed)
ICU/CCU Daily Progress Note    Patient's Name: [name]    Room:  ZO109/UE454-09  Attending Provider: August Luz, MD  Admit Date:03/08/2020  Medical Record Number: [id number]     Date/Time: 03/16/20 7:61 AM    74 year old female w/ hx afib, CHF, group 1 or 2 PAH, HCM, COPD, ILD, cardiac arrest after anesthesia presents with profound acute hypoxic respiratory failure after extubation following elective upgrade of single chamber pacemaker to dual chamber pacemaker.      24hr events:  -Admission to CCU, started on epoprostenol  -Overnight became obtunded (0340) with propofol/precedex, discontinued    Patient Active Problem List   Diagnosis    DOE (dyspnea on exertion)    OSA (obstructive sleep apnea)    Apical variant hypertrophic cardiomyopathy    Hypothyroidism    Pacemaker    Pulmonary hypertension, primary    Patient is Jehovah's Witness    Pacemaker generator end of life          VITAL SIGNS PHYSICAL EXAM   Temp:  [97.2 F (36.2 C)-98 F (36.7 C)] 97.5 F (36.4 C)  Heart Rate:  [69-80] 69  Resp Rate:  [13-54] 14  BP: (101-141)/(56-89) 128/75  FiO2:  [40 %-100 %] 70 %  Blood Glucose:  Pulse ox:  Telemetry:  Vent Settings  Vent Mode: PRVC  FiO2: 70 %  Resp Rate (Set): 14  Vt (Set, mL): 420 mL  PIP Observed (cm H2O): 19 cm H2O  PEEP/EPAP: 6 cm H20  Mean Airway Pressure: 8 cmH20    Intake/Output Summary (Last 24 hours) at 03/16/2020 0749  Last data filed at 03/16/2020 0400  Gross per 24 hour   Intake 306.54 ml   Output 550 ml   Net -243.46 ml    Physical Exam  Neuro: sedated  Cardiac: regular, no m/r/g  Lungs: mechanical breath sounds  Abdomen: obese, soft nontdender  Ext: warm          Scheduled Meds: PRN Meds:    heparin (porcine), 5,000 Units, Subcutaneous, Q8H [facility]  levothyroxine, 50 mcg, Oral, Daily at 0600  senna-docusate, 1 tablet, Oral, Q12H [facility]        Continuous Infusions:   dexmedeTOMIDine Stopped (03/16/20 0340)    epoprostenol 0.3 mg/hr (03/16/20 0438)    norepinephrine  (LEVOPHED) infusion      propofol Stopped (03/16/20 0340)    carboxymethylcellulose (PF), 1 drop, Q1H PRN   And  Lubrifresh PM, , Q1H PRN  fentaNYL (PF), 50 mcg, Q2H PRN            Labs (last 72 hours):  Recent Labs     03/16/20  0303 03/23/2020  2031   WBC 9.39 7.93   Hgb 10.9* 11.7   Hematocrit 35.2 37.4     No results for input(s): PT, INR, PTT in the last 72 hours. Recent Labs     03/16/20  0303 03/22/2020  2031   Sodium 136 140   Potassium 5.5* 4.9   Chloride 110 111   CO2 14* 16*   BUN 20.0* 19.0   Creatinine 1.1* 1.1*   Glucose 131* 113*   Calcium 8.8 8.9   Magnesium 2.0 1.7   Phosphorus 5.2*  --         lactic 1.6  AST/ALT 26/25 T bili 0.5  BNP 103       ABG:  pH 7.285, pCO2 38.8, pO2 68.5;  HCO3 17.9 ( on PRVC rate 14 TV 420 Fio2  59, PEEP 6), 0400 12/10    Microbiology:   none    Imaging:  NCHCT CT scan of the head shows no hydrocephalus, herniation, or  intracranial hemorrhage. There is no visible acute intracranial  Abnormality.    CXR   1. Cardiac enlargement and increased vascular congestion  2. Bibasilar opacities and increased lingular opacities, likely  atelectasis    Assessment and Plan:      Neuro:   Gtts: off sedation currently, previously precedex/ propofol  #Acute encephelopathy:  Unclear etiology at this juncture, may be 2/2 sedation. Not uremic, no hemorrhage/other structural disease noted on NCHCT  -Minimize sedation to avoid deletrious effects on RV function/hemodynamics    Cardio:   #history of afib  Underwent pacemaker upgrade yesterday. Not currently anticoagulated  -Anticoagulation with: eliquis 5 mg BID     #PAH- on chart review either group 1 or 2  -See resp for discussion of acute event    #HCM- avoid intravascular volume depletion  #CHF- Baseline EF 50%. Per bedside echo appears to be at baseline function, no tamponade      FG: net even    Resp:   #Acute hypoxic respiratory failure  Unclear etiology, patient does have history of cardiac arrest with previous anesthesia. Possible this  was secondary to anesthesia with increased afterload for r. Side. Pt previously with cardiac arrest periprocedurally, likely same etiology Doesn't appear volume overloaded, no consolidations or pneumothorax on CXR. Hx of ILD and amiodarone toxicity with lungs. Shunt? A-a O2 gradient 303.7 (expected 22.5) suggesting shunt ddx dead space (PNA, asthma/COPD/PE); L->r shunt (edema, ards, pna) or alveolar hypoventilation (pulmonary fibrosis, ILD).   -Continue epoprostenol  -Extubate to HFNC + inhaled nitric    Renal /Fluid, Electrolytes:   #Hyperkalemia of 5.5  Likely with CKD.   -Daily BMPs, EKG for hyperkalemia      #CKD- baseline 1.1. sCr today 1.1    GI:   NAI      Nutrition: consider tube feeds when clinical course clearer    Infectious Disease (ID):   Afebrile, no leukocytosis. Do not suspect PNA as underlying cause of AHRF           Hem/Onc:   NAI    Endo: BG goal 140-180  euglycemic    Prophylaxis:  - DVT:  Heparin sq- > transition to NOAC  - GI:  famotidine    Code Status: DNR, no reinutbation (discussed directly with patient today on rounds)  Dispo: CCU    Lines/Drains/Airways:      Safety Checklist:     DVT prophylaxis:  CHEST guideline (See page [id number]) Chemical and Mechanical   Foley: Not present   IVs:  Peripheral IV   PT/OT: Not needed   Daily CBC & or Chem ordered:  SHM/ABIM guidelines (see #5) Yes, due to clinical and lab instability   Reference for charges of common labs: CBC auto diff - $76   BMP - $99   Mg - $79        Signed by: Gemma Payor, MD, MD  Date/Time: 03/16/20 7:49 AM

## 2020-03-16 NOTE — Progress Notes (Signed)
[facility] - PROGRESS NOTE      HPI:  74 y.o. 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      Therefore presented this admission for pacemaker upgrade.     24 hours:    Tele - AF/AFL, V pacing    S/p upgrade of MDT pacemaker to dual chamber (atrial lead) yesterday  Intubated for the procedure. Post extubation developed hypoxic respiratory failure and was reintubated and admitted to CICU.     Pacemaker check - normally functioning MDT dual chamber pacemaker.  A and V lead function normal.  DDDR 70 - 130    CXR - no ptx, pacemaker leads WNL    Intubated.  Awake and alert.      Assessment/Plan:  1. S/p upgrade of single chamber PPM (LBB lead) --> to dual chamber (atrial lead added) 12/9  2. Atrial arrhythmias  3. CHB (iatrogenic)  4. Respiratory failure s/p procedure yesterday    Pacemaker function is normal.  Site looks OK.    Is in AF/AFL, rec restarting DOAC at any time from EP standpoint.    Appreciate care of ICU team regarding hypoxic respiratory failure post procedure.    Discussed with team.    Jinny Blossom, PA-C  [facility]  Office [phone]  Spectralink [phone]    Prior Cardiodiagnostic Studies:    ECG (02/22/2020): NSR with CHB and LBB pacing     TTE 02/02/20:    * The left ventricle is normal in size.    * Left ventricular wall thickness is severely increased, particularly  towards the mid cavity and apex consistent with mid/apical hypertrophic cardiomyopathy. 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    * Left ventricular systolic function is mildly decreased with an ejection fraction  by Biplane Method of Discs of  50 %.    * Left ventricular diastolic filling parameters are consistent with Grade II  diastolic dysfunction (pseudonormal pattern) with elevated left atrial  pressure.    * There is diastolic mital regurgitation consistent with AV dissociation.    * The right ventricular cavity size is normal in size.    * Moderately-to-severely decreased right ventricular systolic function  (TAPSE 1.0 cm, 3D RVEF 28% and FAC 24%).    * The left atrium is severely dilated.    * The right atrium is dilated.    * There is moderate tricuspid regurgitation.    * Severe pulmonary hypertension with estimated right ventricular systolic  pressure of  87 mmHg.    * No prior study is available for comparison.     09/20/2019 Cardiac MRI:  LVEF 35%, SV 34mL, LVEDV 98mL, LVESV 64mL  RVEF 40%, SV 48mL, RVEDV , EVESV 64mL  Impression:  1. 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 Would consider coronary disease with previous apical infarction as another possible etiology.  2. Moderately reduced left ventricular function with apical akinesis  3. The right ventricle is normal in size with mildly reduced function  4. The left atrium is moderately dilated  5. The main pulmonary artery is mildly  dilated  6. Transmural enhancement of the left ventricular apex. May represent scar formation in the setting of apical hypertrophic cardiomyopathy verses prior apical infarct.     LHC at [facility] with non significant coronary disease.    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    Hypothyroidism 03/04/2012    DOE (dyspnea  on exertion) 12/24/2010         Scheduled Meds:  Current Facility-Administered Medications   Medication Dose Route Frequency    apixaban  5 mg Oral Q12H [facility]    famotidine  20 mg Intravenous Q24H [facility]    levothyroxine  50 mcg Oral Daily at 0600    senna-docusate  1 tablet Oral Q12H SCH     Continuous Infusions:   epoprostenol 0.3 mg/hr (03/16/20 0911)     PRN Meds:Ventilation **AND** carboxymethylcellulose (PF) **AND** Lubrifresh PM, fentaNYL (PF)    Objective:  General:   Patient Vitals for the past 8 hrs:   BP Temp Temp src Pulse Resp SpO2 Height   03/16/20 1146 -- -- -- -- -- 97 % --   03/16/20 1100 124/75 -- -- 69 22 100 % --   03/16/20 1000 137/71 -- -- 69 (!) 23 100 % --   03/16/20 0920 -- -- -- -- -- 100 % 1.613 m (5' 3.5")   03/16/20 0900 119/72 -- -- 69 14 98 % --   03/16/20 0800 125/73 97.5 F (36.4 C) Axillary 69 21 99 % --   03/16/20 0700 128/75 -- -- 69 14 100 % --   03/16/20 0600 132/78 -- -- 69 14 100 % --   03/16/20 0500 121/80 -- -- 69 (!) 25 97 % --       Gen: A&O, NAD.  Intubated, not sedated.     Skin:  Left pectoral device pocket incision clean, dry and intact, wound edges well-approximated, no erythema, edema or discharge, soft, overlying skin freely mobile, Dermabond intact      Lab Review  Lab Results   Component Value Date    WBC 9.39 03/16/2020    HGB 10.9 (L) 03/16/2020    HCT 35.2 03/16/2020    MCV 103.5 (H) 03/16/2020    PLT 197 03/16/2020      Lab Results   Component Value Date    CO2 14 (L) 03/16/2020    BUN 20.0 (H) 03/16/2020        Lab Results   Component Value Date    INR 1.2 (H) 02/01/2020           LOS: 0 days     Agree with A/P by APP.  Ppost implant device interrogation and CXR were within normal limits.  No evidence of PNX.  She was reintubated after the procedure due to respiratory failure.  She will likely be extubated later today. Please restart DOAC.      Will sign off at this time.

## 2020-03-16 NOTE — Progress Notes (Signed)
Time out pre procedure pause performed with Physician [facility] get go ahead all necessary equipment was available pt extubated to HFNC 40/60%, 20PPM I N0.          Marland Kitchen

## 2020-03-16 NOTE — Plan of Care (Addendum)
Assumed care 1900.    Pt given lasix bolus. Foley catheter inserted for urinary retention.    Pt remained on HFNC overnight.        Problem: Non-Violent Restraints Interdisciplinary Plan  Goal: Will be injury free during the use of non-violent restraints  Outcome: Progressing     Problem: Compromised Tissue integrity  Goal: Damaged tissue is healing and protected  Outcome: Progressing  Goal: Nutritional status is improving  Outcome: Progressing     Problem: Moderate/High Fall Risk Score >5  Goal: Patient will remain free of falls  Outcome: Progressing     Problem: Safety  Goal: Patient will be free from injury during hospitalization  Outcome: Progressing  Goal: Patient will be free from infection during hospitalization  Outcome: Progressing     Problem: Pain  Goal: Pain at adequate level as identified by patient  Outcome: Progressing     Problem: Side Effects from Pain Analgesia  Goal: Patient will experience minimal side effects of analgesic therapy  Outcome: Progressing     Problem: Discharge Barriers  Goal: Patient will be discharged home or other facility with appropriate resources  Outcome: Progressing     Problem: Psychosocial and Spiritual Needs  Goal: Demonstrates ability to cope with hospitalization/illness  Outcome: Progressing

## 2020-03-16 NOTE — Plan of Care (Addendum)
ICU Checklist      Ventilator Bundle    Patient on ventilator: Yes    IF NO STOP HERE  Date Intubated: 12/9  Day#: 1  Ventilator/sedation order set initiated? Yes  Readiness to wean/extubate: SAT/SBT Yes  Patient intubated for >7 days, consider tracheostomy pl acement: Not Applicable  Pain target achieved (CPOT <3): Yes  Sedation Target: Rass target achieved? No        Line Bundle  Central line present: No    Date inserted: n/a  Type: n/a  Location: n/a  Can central line be removed?: Not Applicable   PIV x 3    Foley Bundle  Foley catheter present?: No    Date placed:  Can urinary catheter be removed: Not Applicable    General Care Bundle  Restraints Renewed?: Yes  Fluid balance achieved?: No  DVT Prophylaxis ordered?: Yes  If no, choose one of the following:   []  Active Bleed     []  Coagulopathy   []  Thrombocytopenia  []  Other:   Nutrition ordered?: No    If no, choose one of the following:  [x]  NPO    []  Aspiration risk   []  Ileus   []  Hemodynamically instable  []  Other:   Bowel movement in last 48 hours?: Yes  If no, bowel regimen in place?: Yes  PUD prophylaxis: Yes  Mobility protocol: PT/OT ordered? No

## 2020-03-16 NOTE — UM Notes (Signed)
SUMMARY: Payor: MEDICARE / Plan: MEDICARE PART A AND B / Product Type: Medicare /      Number of reviews in this clinical:    12.9-12/10 MCR. Pul Htn. Resp Fail after PPM change, re-intubated, hypoxic even after intubation, Improved after Veletri gtt started.     Post extubation developed hypoxic respiratory failure and was reintubated- continued to be hypoxic even on 100% FiO2, now improved with Veletri         LOC:     [facility] CICU  ADMIT TO HOSPITAL DATE:03/12/2020 12:11 PM  DX:   The encounter diagnosis was Encounter for pacemaker at end of battery life.  Patient Name: [facility]  Attending:Quinn, Canary Brim, MD  Patient's MRN: [id number]  DOB: May 19, 1945    PROCEDURE: PR REMVL PERM PM PLSE GEN W/REPL PLSE GEN SNGL LEAD [[id number]] (PM GENERATOR CHANGE SINGLE)  74 y.o. female with complex cardiopulmonary history.  She came in for elective change of her single-chamber pacemaker to dual-chamber pacemaker.  Per anesthesia reports that difficulty doing that under LMA sedation that she had to be intubated.  After extubation she developed hypoxemia and respiratory distress and had to be reintubated.  [facility] consulted admitted to CICU for post procedure respiratory failure and hypoxemia and management.    On arrival to the CICU she is very hypoxemic with saturation down to 70s on 100% FiO2 and PEEP of 8.  Stat chest x-ray did not show pneumothorax.  ET tube in appropriate position.    Stat sonographic examination of heart did not show any pericardial tamponade.  LV function At baseline.      ED Triage Vitals   Enc Vitals Group      BP 03/20/2020 1402 117/82      Heart Rate 03/21/2020 1402 80      Resp Rate 03/17/2020 1402 14      Temp 03/14/2020 1402 97.2 F (36.2 C)      Temp Source 03/23/2020 1402 Temporal      SpO2 03/10/2020 1402 97 %      Weight 03/09/20 1317 81.2 kg (179 lb)      Height 03/09/20 1317 1.575 m (5\' 2" )      Head Circumference --       Peak Flow --       Pain Score  03/16/2020 1402 0      Pain Loc --       Pain Edu? --       Excl. in GC? --          MEDS:  Continuous IV Drips/gtts Meds:   dexmedeTOMIDine Stopped (03/16/20 0340)    epoprostenol 0.3 mg/hr (03/16/20 0438)    norepinephrine (LEVOPHED) infusion      propofol Stopped (03/16/20 0340)     Selected Meds IV:  Current Facility-Administered Medications   Medication Dose Route Frequency    famotidine  20 mg Intravenous Q24H [facility]    heparin (porcine)  5,000 Units Subcutaneous Q8H [facility]    levothyroxine  50 mcg Oral Daily at 0600    senna-docusate  1 tablet Oral Q12H [facility]       If this transmission is incomplete or not received in good order, please advise this sender  Nedra Hai RN) at  [phone] (Confidential message phone). Thank you.             PMH:  has a past medical history of A-fib, Anesthesia complication (Before 2020), Cardiomyopathy, CKD (chronic kidney disease), Congestive heart failure,  COPD (chronic obstructive pulmonary disease), COVID-19 (06/2019), Difficulty walking, Disorder of thyroid, Gastrointestinal hemorrhage, Gout, Immunization series complete (06/2019, 09/2019, 10/2019), NSTEMI (non-ST elevated myocardial infarction) (02/2012), Obesity, On supplemental oxygen by nasal cannula, OSA on CPAP (CPAP since 2015), Pacemaker (01/2019), PNA (pneumonia) (Last ~2016), Pulmonary hypertension, and Refusal of blood product.    PSH:   has a past surgical history that includes Insert / replace / remove pacemaker (01/2019); Cesarean section (1983); CARDIAC ABLATION (01/31/2019); and Right Heart Cath ONLY incl. CO and Sats (Right, 01/24/2020).        ED:    ADMISSION REVIEW:          PLAN:    VS:  FiO2:  [40 %-100 %] 70 %  S RR:  [14] 14  S VT:  [420 mL-430 mL] 420 mL  PEEP/EPAP:  [0 cm H20-8 cm H20] 6 cm H20  LABS:  Rads: CT Head WO Contrast    Result Date: 03/16/2020   CT scan of the head shows no hydrocephalus, herniation, or intracranial hemorrhage. There is no visible acute intracranial abnormality. Miguel Dibble, MD   03/16/2020 5:11 AM    XR Chest AP Portable    Result Date: 04-03-20  1. Cardiac enlargement and increased vascular congestion 2. Bibasilar opacities and increased lingular opacities, likely atelectasis Genelle Bal  April 03, 2020 8:17 PM           ===================================.  Continued Stay Review: 03/16/20.        PLAN:        VS: BP 128/75    Pulse 69    Temp 97.5 F (36.4 C)    Resp 14    Ht 1.613 m (5' 3.5")    Wt 81.2 kg (179 lb)    SpO2 100%    BMI 31.21 kg/m :  FiO2:  [40 %-100 %] 70 %  S RR:  [14] 14  S VT:  [420 mL-430 mL] 420 mL  PEEP/EPAP:  [0 cm H20-8 cm H20] 6 cm H20      Recent Labs     03/16/20  0303 Apr 03, 2020  2031   BUN 20.0* 19.0   Creatinine 1.1* 1.1*   Sodium 136 140   Potassium 5.5* 4.9   Magnesium 2.0 1.7   Calcium 8.8 8.9   Chloride 110 111   Glucose 131* 113*   CO2 14* 16*     Recent Labs     03/16/20  0610 03/16/20  0303 04-03-2020  2031   WBC  --  9.39 7.93   Hgb  --  10.9* 11.7   Hematocrit  --  35.2 37.4   Platelets  --  197 249   Lactic Acid 1.6  --   --    AST (SGOT)  --   --  26   ALT  --   --  25     Recent Labs     03/16/20  0408   pH, Arterial 7.285*   pCO2, Arterial 38.8   HCO3, Arterial 17.9*   pO2, Arterial 68.5*         MEDS:  Continuous IV Drips/gtts Meds:   dexmedeTOMIDine Stopped (03/16/20 0340)    epoprostenol 0.3 mg/hr (03/16/20 0438)    norepinephrine (LEVOPHED) infusion      propofol Stopped (03/16/20 0340)     Scheduled Meds:  Current Facility-Administered Medications   Medication Dose Route Frequency    famotidine  20 mg Intravenous Q24H [facility]    heparin (porcine)  5,000 Units Subcutaneous  Q8H [facility]    levothyroxine  50 mcg Oral Daily at 0600    senna-docusate  1 tablet Oral Q12H [facility]           UTILIZATION REVIEW CONTACT: Name: Patty Sermons RN BSN  Clinical Case Manager  - Utilization Review  [facility]  [address] [place], [place]  [address]  NPI:   [id number]  Tax ID:  [id number]  Phone: [phone]  Fax: [phone]     Please use fax  number [phone] to provide authorization for hospital services or to request additional information.         NOTES TO REVIEWER:     This clinical review is based on/compiled from documentation provided by the treatment team within the patients medical record.
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
## 2020-03-16 NOTE — Progress Notes (Signed)
03/16/20 1220   Extubation   Extubation reason Param/CPAP Protocol   Extubated to [facility]   Adverse Reactions None   20ppm INO with HFNC

## 2020-03-16 NOTE — Plan of Care (Signed)
Assumed care at 0700. Dual skin check off was completed with off going nurse.     At the beginning of shift, pt was intubated and drowsy. Pt was anxious and attempting to self-extubate. Pt was extubated to HFNC and nitric oxide at 1200. Pt is currently calm and cooperative. Pt has unlabored and regular respirations and satting between 92-98%. Pt denies pain. Pt remains afebrile.     1530- Pt's BP began to decrease, MAP <65. MD notified. MAP goal now >60.    Pt is anuric. 40 lasix given. UOP ~182mL. Bladder scanned 68 mL. MD notified.     After extubation, pt tolerated ice chips, sips of water and apple sauce with medications.     Family at bedside and able to calm and reassure pt.     All safety measures remained in place for duration of shift. Q2 hour turns performed throughout shift.       Problem: Non-Violent Restraints Interdisciplinary Plan  Goal: Will be injury free during the use of non-violent restraints  03/16/2020 0858 by Christia Reading, RN  Outcome: Progressing  03/16/2020 0857 by Christia Reading, RN  Outcome: Progressing     Problem: Compromised Tissue integrity  Goal: Damaged tissue is healing and protected  03/16/2020 0858 by Christia Reading, RN  Outcome: Progressing  03/16/2020 0857 by Christia Reading, RN  Outcome: Progressing  Goal: Nutritional status is improving  03/16/2020 0858 by Christia Reading, RN  Outcome: Progressing  03/16/2020 0857 by Christia Reading, RN  Outcome: Progressing     Problem: Moderate/High Fall Risk Score >5  Goal: Patient will remain free of falls  Outcome: Progressing

## 2020-03-16 NOTE — Plan of Care (Signed)
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  Pf note, pt is a Jehovah's witness and declines blood transfusions.    Neuro:  - pt wishes to be DNR/DNI    CV:  - restart home selexipag  - BP not far from baseline (~ 100/60)  - LA 1.6  - very low cardiac output at baseline, hence upgrade from single-chamber pacemaker to dual-chamber pacemaker (in hopes synchronization would augment her CO)  - TTE w/ bubble  - Echo w/ bubble 09/08/17 found no evidence of right to left shunt, though AF ablation procedure in 2017 included transseptal puncture  - cautious w/ diuresis considering HCM    Pulm:  - CXR w/ prominent vasculature and new opacities  - diuresis  - CPAP QHS  - previously: HRCT without ILD or pulmonary fibrosis; dual energy CTA negative for clots  - Advanced Lung following    ID: NAI    Renal/Fluid, Electrolytes:  - NAGMA, unclear etiology  - start Na bicarb tabs  - Cr 1.1 ~baseline    Heme:  - c/w home Eliquis (AF)  - pt is a Jehovah's witness and declines blood transfusions    GI/Nurition:    Endo:  - c/w home levoT4    Lines/Tubes:   - none    PPx:   -DVT: Eliquis   -GI:  famotidine  Code status: DNR / DNI
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
## 2020-03-17 ENCOUNTER — Inpatient Hospital Stay: Payer: Medicare Other

## 2020-03-17 DIAGNOSIS — I270 Primary pulmonary hypertension: Secondary | ICD-10-CM

## 2020-03-17 DIAGNOSIS — N179 Acute kidney failure, unspecified: Secondary | ICD-10-CM

## 2020-03-17 DIAGNOSIS — R0602 Shortness of breath: Secondary | ICD-10-CM | POA: Diagnosis not present

## 2020-03-17 DIAGNOSIS — Z45010 Encounter for checking and testing of cardiac pacemaker pulse generator [battery]: Secondary | ICD-10-CM | POA: Diagnosis not present

## 2020-03-17 LAB — URINALYSIS REFLEX TO MICROSCOPIC EXAM - REFLEX TO CULTURE
Bilirubin, UA: NEGATIVE
Glucose, UA: NEGATIVE
Ketones UA: NEGATIVE
Nitrite, UA: NEGATIVE
Protein, UR: 100 — AB
Specific Gravity UA: 1.018 (ref 1.001–1.035)
Urine pH: 5 (ref 5.0–8.0)
Urobilinogen, UA: NORMAL mg/dL (ref 0.2–2.0)

## 2020-03-17 LAB — MAGNESIUM
Magnesium: 1.9 mg/dL (ref 1.6–2.6)
Magnesium: 2 mg/dL (ref 1.6–2.6)

## 2020-03-17 LAB — BASIC METABOLIC PANEL
Anion Gap: 16 — ABNORMAL HIGH (ref 5.0–15.0)
Anion Gap: 16 — ABNORMAL HIGH (ref 5.0–15.0)
BUN: 32 mg/dL — ABNORMAL HIGH (ref 7.0–19.0)
BUN: 39 mg/dL — ABNORMAL HIGH (ref 7.0–19.0)
CO2: 15 mEq/L — ABNORMAL LOW (ref 22–29)
CO2: 17 mEq/L — ABNORMAL LOW (ref 22–29)
Calcium: 8.9 mg/dL (ref 7.9–10.2)
Calcium: 8.6 mg/dL (ref 7.9–10.2)
Chloride: 109 mEq/L (ref 100–111)
Chloride: 110 mEq/L (ref 100–111)
Creatinine: 1.7 mg/dL — ABNORMAL HIGH (ref 0.6–1.0)
Creatinine: 2 mg/dL — ABNORMAL HIGH (ref 0.6–1.0)
Glucose: 104 mg/dL — ABNORMAL HIGH (ref 70–100)
Glucose: 102 mg/dL — ABNORMAL HIGH (ref 70–100)
Potassium: 5.6 mEq/L — ABNORMAL HIGH (ref 3.5–5.1)
Potassium: 4.2 mEq/L (ref 3.5–5.1)
Sodium: 140 mEq/L (ref 136–145)
Sodium: 143 mEq/L (ref 136–145)

## 2020-03-17 LAB — CBC
Absolute NRBC: 0.04 10*3/uL — ABNORMAL HIGH (ref 0.00–0.00)
Hematocrit: 36.4 % (ref 34.7–43.7)
Hgb: 11.7 g/dL (ref 11.4–14.8)
MCH: 31.7 pg (ref 25.1–33.5)
MCHC: 32.1 g/dL (ref 31.5–35.8)
MCV: 98.6 fL — ABNORMAL HIGH (ref 78.0–96.0)
MPV: 11.7 fL (ref 8.9–12.5)
Nucleated RBC: 0.3 /100 WBC — ABNORMAL HIGH (ref 0.0–0.0)
Platelets: 231 10*3/uL (ref 142–346)
RBC: 3.69 10*6/uL — ABNORMAL LOW (ref 3.90–5.10)
RDW: 19 % — ABNORMAL HIGH (ref 11–15)
WBC: 13.19 10*3/uL — ABNORMAL HIGH (ref 3.10–9.50)

## 2020-03-17 LAB — HEPATIC FUNCTION PANEL
ALT: 22 U/L (ref 0–55)
AST (SGOT): 29 U/L (ref 5–34)
Albumin/Globulin Ratio: 1.1 (ref 0.9–2.2)
Albumin: 3.8 g/dL (ref 3.5–5.0)
Alkaline Phosphatase: 146 U/L — ABNORMAL HIGH (ref 37–106)
Bilirubin Direct: 0.2 mg/dL (ref 0.0–0.5)
Bilirubin Indirect: 0.4 mg/dL (ref 0.2–1.0)
Bilirubin, Total: 0.6 mg/dL (ref 0.2–1.2)
Globulin: 3.4 g/dL (ref 2.0–3.6)
Protein, Total: 7.2 g/dL (ref 6.0–8.3)

## 2020-03-17 LAB — LACTIC ACID, PLASMA
Lactic Acid: 2.7 mmol/L — ABNORMAL HIGH (ref 0.2–2.0)
Lactic Acid: 3 mmol/L — ABNORMAL HIGH (ref 0.2–2.0)
Lactic Acid: 3.3 mmol/L — ABNORMAL HIGH (ref 0.2–2.0)
Lactic Acid: 2.8 mmol/L — ABNORMAL HIGH (ref 0.2–2.0)

## 2020-03-17 LAB — GFR
EGFR: 35.5
EGFR: 29.4

## 2020-03-17 LAB — PROCALCITONIN: Procalcitonin: 0.94 — ABNORMAL HIGH (ref 0.00–0.10)

## 2020-03-17 MED ORDER — SALINE SPRAY 0.65 % NA SOLN
1.0000 | NASAL | Status: DC | PRN
Start: 2020-03-17 — End: 2020-03-26
  Administered 2020-03-18: 06:00:00 1 via NASAL
  Filled 2020-03-17: qty 44

## 2020-03-17 MED ORDER — VANCOMYCIN HCL IN NACL 1.5-0.9 GM/500ML-% IV SOLN
1500.0000 mg | Freq: Once | INTRAVENOUS | Status: AC
Start: 2020-03-17 — End: 2020-03-17
  Administered 2020-03-17: 12:00:00 1500 mg via INTRAVENOUS
  Filled 2020-03-17: qty 500

## 2020-03-17 MED ORDER — ACETAMINOPHEN 325 MG PO TABS
650.0000 mg | ORAL_TABLET | Freq: Three times a day (TID) | ORAL | Status: DC | PRN
Start: 2020-03-17 — End: 2020-03-26
  Administered 2020-03-24 – 2020-03-25 (×2): 650 mg via ORAL
  Filled 2020-03-17 (×2): qty 2

## 2020-03-17 MED ORDER — LIDOCAINE VISCOUS HCL 2 % MT SOLN
5.0000 mL | Freq: Four times a day (QID) | OROMUCOSAL | Status: DC | PRN
Start: 2020-03-17 — End: 2020-03-26
  Administered 2020-03-17 – 2020-03-19 (×5): 5 mL via ORAL
  Filled 2020-03-17 (×8): qty 15

## 2020-03-17 MED ORDER — CALCIUM CARBONATE ANTACID 500 MG PO CHEW
500.0000 mg | CHEWABLE_TABLET | Freq: Four times a day (QID) | ORAL | Status: DC | PRN
Start: 2020-03-17 — End: 2020-03-26

## 2020-03-17 MED ORDER — VANCOMYCIN PHARMACY TO DOSE PLACEHOLDER
INTRAVENOUS | Status: DC
Start: 2020-03-17 — End: 2020-03-18

## 2020-03-17 MED ORDER — MIDODRINE HCL 10 MG PO TABS
10.0000 mg | ORAL_TABLET | Freq: Three times a day (TID) | ORAL | Status: DC
Start: 2020-03-17 — End: 2020-03-26
  Administered 2020-03-17 – 2020-03-25 (×25): 10 mg via ORAL
  Filled 2020-03-17 (×25): qty 1

## 2020-03-17 MED ORDER — SELEXIPAG 200 MCG PO TABS
1200.0000 ug | ORAL_TABLET | Freq: Two times a day (BID) | ORAL | Status: DC
Start: 2020-03-17 — End: 2020-03-26
  Administered 2020-03-17 – 2020-03-25 (×16): 1200 ug via ORAL
  Filled 2020-03-17 (×22): qty 6

## 2020-03-17 MED ORDER — AYR SALINE NASAL NA GEL
NASAL | Status: DC | PRN
Start: 2020-03-17 — End: 2020-03-17

## 2020-03-17 MED ORDER — FLUTICASONE PROPIONATE 50 MCG/ACT NA SUSP
1.0000 | Freq: Every day | NASAL | Status: DC
Start: 2020-03-17 — End: 2020-03-26
  Administered 2020-03-17 – 2020-03-21 (×5): 1 via NASAL
  Filled 2020-03-17: qty 16

## 2020-03-17 MED ORDER — SODIUM CHLORIDE 0.9 % IV MBP
3.0000 g | Freq: Three times a day (TID) | INTRAVENOUS | Status: DC
Start: 2020-03-17 — End: 2020-03-17

## 2020-03-17 MED ORDER — ONDANSETRON HCL 4 MG/2ML IJ SOLN
4.0000 mg | Freq: Once | INTRAMUSCULAR | Status: AC
Start: 2020-03-17 — End: 2020-03-17
  Administered 2020-03-17: 20:00:00 4 mg via INTRAVENOUS
  Filled 2020-03-17: qty 2

## 2020-03-17 MED ORDER — SODIUM CHLORIDE 0.9 % IV MBP
4.5000 g | Freq: Three times a day (TID) | INTRAVENOUS | Status: DC
Start: 2020-03-17 — End: 2020-03-23
  Administered 2020-03-17 – 2020-03-23 (×18): 4.5 g via INTRAVENOUS
  Filled 2020-03-17 (×17): qty 20

## 2020-03-17 MED ORDER — FAMOTIDINE 20 MG PO TABS
20.0000 mg | ORAL_TABLET | Freq: Every day | ORAL | Status: DC
Start: 2020-03-17 — End: 2020-03-26
  Administered 2020-03-17 – 2020-03-25 (×9): 20 mg via ORAL
  Filled 2020-03-17 (×9): qty 1

## 2020-03-17 MED ORDER — SODIUM POLYSTYRENE SULFONATE 15 GM/60ML PO SUSP
15.0000 g | Freq: Once | ORAL | Status: AC
Start: 2020-03-17 — End: 2020-03-17
  Administered 2020-03-17: 12:00:00 15 g via ORAL
  Filled 2020-03-17: qty 60

## 2020-03-17 NOTE — Plan of Care (Signed)
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  Pf note, pt is a Jehovah's witness and declines blood transfusions.    Neuro:  - pt wishes to be DNR/DNI    CV:  - decreased home selexipag, given hypotension  - BP not that far from baseline (~100/60)  - LA rising 1.6 --> 3.0  - start midodrine 10 TID, given rising lactate, relatively preserved LV function  - very low cardiac output at baseline, hence upgrade from single-chamber pacemaker to dual-chamber pacemaker (in hopes synchronization would augment her CO)  - TTE w/ bubble: LVEF 55-60%, apical variant HCM, grade III diastolic dysfunction, no evidence of interatrial shunt, severe PH RVSP 85, severe TR, TAPSE 0.70, small posterior pericardial effusion  - Echo w/ bubble 09/08/17 found no evidence of right to left shunt, though AF ablation procedure in 2017 included transseptal puncture  - cautious w/ diuresis considering HCM    Pulm:  - CXR w/ prominent vasculature and new opacities  - s/p diuresis 12/10, now on hold  - starting abx 12/11, given productive sputum  - CPAP QHS  - previously: HRCT without ILD or pulmonary  fibrosis; dual energy CTA negative for clots  - Advanced Lung following    ID: NAI  - now w/ productive cough; pt may have aspirated during anesthesia w/ LMA  - start Zosyn and vancomycin  - pan-culture  - also w/ sore throat and erythema / pus on right posterior pharynx, perhaps secondary to trauma w/ intubation  - throat culture  - ENT evaluation  - pt cannot lie flat for CT neck    Renal/Fluid, Electrolytes:  - NAGMA, unclear etiology  - bicarb tabs  - net neg 1L yest (Lasix 40, 40, 80)  - worsening AKI, hold further diuresis  - kayexalate x 1 for hyper-K+    Heme:  - c/w home Eliquis (AF)  - pt is a Jehovah's witness and declines blood transfusions    GI/Nurition:    Endo:  - c/w home levoT4    Lines/Tubes:   - none    PPx:   -DVT: Eliquis   -GI: famotidine  Code status: DNR / DNI

## 2020-03-17 NOTE — Plan of Care (Signed)
Problem: Compromised Tissue integrity  Goal: Damaged tissue is healing and protected  Outcome: Progressing  Flowsheets (Taken 03/17/2020 1708)  Damaged tissue is healing and protected:   Monitor/assess Braden scale every shift   Increase activity as tolerated/progressive mobility   Keep intact skin clean and dry   Relieve pressure to bony prominences for patients at moderate and high risk   Provide wound care per wound care algorithm   Reposition patient every 2 hours and as needed unless able to reposition self   Use incontinence wipes for cleaning urine, stool and caustic drainage. Foley care as needed  Goal: Nutritional status is improving  Outcome: Progressing  Flowsheets (Taken 03/17/2020 1708)  Nutritional status is improving:   Assist patient with eating   Allow adequate time for meals   Encourage patient to take dietary supplement(s) as ordered     Problem: Pain  Goal: Pain at adequate level as identified by patient  Outcome: Progressing  Flowsheets (Taken 03/17/2020 1708)  Pain at adequate level as identified by patient:   Identify patient comfort function goal   Evaluate patient's satisfaction with pain management progress    Plan: Continue to monitor patient's respiration and oxygenation status. Safety is maintained. Call bell is within reach.
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
## 2020-03-17 NOTE — Consults (Signed)
Vancomycin Day #1 empirically for PNA     Age: 74 y.o.  Recent Labs     03/17/20  0441 03/16/20  0303 03/29/20  2031   WBC 13.19* 9.39 7.93   BUN 32.0* 20.0* 19.0   Creatinine 1.7* 1.1* 1.1*     Wt Readings from Last 1 Encounters:   03/17/20 83.5 kg (184 lb 1.4 oz)     Estimated Creatinine Clearance: 30.1 mL/min (A) (based on SCr of 1.7 mg/dL (H)).     Cultures: blood, sputum, MRSA pending     Comments/Recommendations:   Loading vancomycin 1500 mg x1 and dosing by level per AKI.   Also continuing on zosyn empirically.  Vancomycin 24 hr random ordered for 12/12 1200     Thank you for consulting pharmacy to dose vancomycin for this patient. We will continue to monitor, obtain levels, and adjust dosing as indicated for the duration of this consult

## 2020-03-17 NOTE — Respiratory Progress Note (Cosign Needed)
Respiratory Therapy Patient Assessment    FI102/FI102-01  03/17/20 12:09 PM  RT: Anola Gurney, RT      Admitting DX: Encounter for pacemaker at end of battery life [Z45.010]  Pacemaker generator end of life [Z45.010]    Pulmonary History: ILD,COPD    Other Pulm Hx:      Therapy ordered:       Respiratory Orders   (From admission, onward)             Start     Ordered    03/16/20 2324  Ventilation  Continuous (RT)      Question Answer Comment   Mode NIV/PS    Titrate to maintain SPO2 94% or greater    Rate: 10    PEEP 6    PS LEVEL 6    PC 0        03/16/20 2325    03/16/20 1619  High Humidity High Flow Nasal Cannula  Continuous (RT)      Comments:   All Adult patients ordered for Respiratory Therapy, i.e., inhaled meds, secretion clearance/lung expansion or Oxygen greater than 5 liters/min will be evaluated by a Respiratory Therapist and assessed per Respiratory Therapy Patient Driven Protocol.  Initial assessment and changes made per protocol can be found in the progress note section of the patient chart.   Question Answer Comment   Flow (lpm) 40    FiO2 (%) 50    Titrate to maintain SPO2 92% or greater        03/16/20 1619    03/16/20 1130  Nitric oxide  Continuous (RT)      Question:  PPM (parts per million)  Answer:  20    03/16/20 1130                      PT able to take deep breath?  Incentive Spirometry Goal (mL): 1000 mL  Incentive Spirometry Achieved (mL): 1000 mL       Surgical Status:  (pace maker )  Airway:    Mobility: Non-ambulatory, can reposition self  CXR:  (12/9  Bibasilar opacities ,likely atelctasis  )    Cough Effort:  Good  Secretion Amount: Moderate    Sputum Consistency: Thick     Can clear secretions with cough? Yes   Can clear secretions with suctioning?      Social History     Tobacco Use   Smoking Status Former Smoker    Years: 20.00    Types: Cigarettes    Quit date: 03/09/2000    Years since quitting: 20.0   Smokeless Tobacco Never Used   Tobacco Comment    smoked 1 pkg  weekly         Breath Sounds:  Bilateral Breath Sounds: Diminished  R Breath Sounds: Diminished  L Breath Sounds: Diminished    Heart Rate: 69 Resp Rate: (!) 28  SpO2: (!) 85 % O2 Device: HFNC  FiO2: 60 %  O2 Flow Rate (L/min): 40 L/min    Home regimen:  Home Treatments: y  Home Oxygen: y (5 L)   Home CPAP/BiLevel: n    Criteria for therapy:  Secretion Clearance: None indicated    Medications: Home regimen (pt at baseline therapy)    Recommendations/Interventions:  Recommendations/Interventions: PEP,IS (nursing)     Expected Outcomes:  Secretion: Clears with cough or suction  Lung Expansion: Improved lung vol on CXR  Meds: Return to baseline home regimen      Re-Evaluation:  Follow-up Date:   Improving with Therapy: yes     Plan of Care Recommendations:    PEP Tx Q6 [facility]
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
## 2020-03-17 NOTE — Progress Notes (Signed)
ICU/CCU Daily Progress Note    Patient's Name: [name]    Room:  ZO109/UE454-09  Attending Provider: August Luz, MD  Admit Date:03/24/20  Medical Record Number: [id number]     Date/Time: 03/17/20 8:80 AM    74 year old female w/ hx afib, CHF, group 1 or 2 PAH, HCM, COPD, ILD, cardiac arrest after anesthesia presents with profound acute hypoxic respiratory failure after extubation following elective upgrade of single chamber pacemaker to dual chamber pacemaker.      24hr events:  NAEON. C/w HFNC. Got 80 mg IV lasix x 1 last night w/good output. Seen at bedside. Complains of significant pain in her throat making swallowing painful. She prefers to stick to liquids for now. She also complains of new nasal congestion making her feel like she can't get enough air through the HFNC. She tried to use bipap overnight but could not tolerate the mask covering her nose and mouth.     She denies CP, worsening SOB, abdominal pain, HA, dizziness, F/C, and cough.     Patient Active Problem List   Diagnosis    DOE (dyspnea on exertion)    OSA (obstructive sleep apnea)    Apical variant hypertrophic cardiomyopathy    Hypothyroidism    Pacemaker    Pulmonary hypertension, primary    Patient is Jehovah's Witness    Pacemaker generator end of life    Encounter for pacemaker at end of battery life       VITAL SIGNS PHYSICAL EXAM   Temp:  [97.4 F (36.3 C)-98.1 F (36.7 C)] 98.1 F (36.7 C)  Heart Rate:  [69-71] 69  Resp Rate:  [14-36] 30  BP: (82-137)/(50-79) 105/59  FiO2:  [49 %-70 %] 60 %   Flow: 40 L/min  Pulse ox: 92-97%    Blood Glucose: 104    Telemetry:     Vent Settings  Vent Mode: PS/CPAP Automode  FiO2: 60 %  Resp Rate (Set): 0  Vt (Set, mL): 0 mL  PIP Observed (cm H2O): 15 cm H2O  PEEP/EPAP: 6 cm H20  Pressure Support / IPAP: 10 cmH20  Mean Airway Pressure: 8 cmH20    Intake/Output Summary (Last 24 hours) at 03/17/2020 0812  Last data filed at 03/17/2020 0600  Gross per 24 hour   Intake 60  ml   Output 1145 ml   Net -1085 ml    Physical Exam  General: NAD  Neuro: A&O x 4  Cardiac: RRR, no m/r/g  Lungs: Coarse rales b/l no rhonchi or wheeze  Abdomen: soft, NT, ND  Ext: warm and dry, no LE edema          Scheduled Meds: PRN Meds:    apixaban, 5 mg, Oral, Q12H SCH  famotidine, 20 mg, Oral, Daily  levothyroxine, 50 mcg, Oral, Daily at 0600  perflutren protein A microsph, 3 mL, Intravenous, Once  selexipag, 1,600 mcg, Oral, Q12H SCH  senna-docusate, 1 tablet, Oral, Q12H SCH  sodium bicarbonate, 1,300 mg, Oral, QID        Continuous Infusions:   benzocaine-menthol, 1 lozenge, Q1H PRN            Labs (last 72 hours):  Recent Labs     03/17/20  0441 03/16/20  0303 24-Mar-2020  2031   WBC 13.19* 9.39 7.93   Hgb 11.7 10.9* 11.7   Hematocrit 36.4 35.2 37.4     No results for input(s): PT, INR, PTT in the last 72 hours.  Lactic Acid 3.0 (2.7)    BNP 186 (103)   Recent Labs     03/17/20  0441 03/16/20  0303 03/19/2020  2031   Sodium 140 136 140   Potassium 5.6* 5.5* 4.9   Chloride 109 110 111   CO2 15* 14* 16*   BUN 32.0* 20.0* 19.0   Creatinine 1.7* 1.1* 1.1*   Glucose 104* 131* 113*   Calcium 8.9 8.8 8.9   Magnesium 1.9 2.0 1.7   Phosphorus  --  5.2*  --           ABG:  pH 7.285, pCO2 38.8, pO2 68.5;  HCO3 17.9 ( on PRVC rate 14 TV 420 Fio2 59, PEEP 6), 0400 12/10    Microbiology:   None    Imaging:  XR Chest AP Portable    Result Date: 03/17/2020  . Improved edema post extubation Marty Heck, MD  03/17/2020 12:19 PM     TTE 12/10    * Left ventricular systolic function is normal with an ejection fraction of  55 to 60%.    * Increased left ventricular wall thickness towards the apex,    * morphology suggestive of apical    * variant hypertrophic cardiomyopathy.    * Decreased right ventricular systolic function.    * The left atrium is moderately dilated.    * The right atrium is dilated.    * No evidence of interatrial shunt by color Doppler or agitated saline (with  and without provocative maneuver).    * There  is severe tricuspid regurgitation.    * Severe pulmonary hypertension with estimated right ventricular systolic  pressure of  85 mmHg.    * The IVC is dilated with < 50% respiratory variance consistent with  significantly elevated RA pressure of 15 mmHg.    * Small posterior pericardial effusion visualized.    * Compared to the prior study on 02/02/2020,  TR is worse.      Assessment and Plan:  74 y.o. female with PMH of Afib, CHF 2/2 HCM, Group 1/2 PAH/PH, COPD, ILD, cardiac arrest after anesthesia who presented on 03/23/2020 with profound acute hypoxic respiratory failure after extubation following elective upgrade of single to dual chamber pacemaker. Now with evidence of shock of unclear etiology cardiogenic vs. distributive less likely hypovolemic      Neuro:   NAI    #Encephalopathy - resolved    Cardio:   #Shock  -Borderline hypotension w/evidence of end organ ischemia w/rising lactic acid and AKI.  -Etiology unclear, cardiogenic vs. distributive 2/2 sepsis; no evidence of hypovolemic/hemorrhagic shock.  -Infectious w/u as below  -LFTs to evaluate for liver injury  -Midodrine 10 mg TID for pressure support; may help with systemic vasodilation 2/2 PH meds    #HCM  #HFpEF (50%)  -Repeat echo shows LVEF 55-60% and severe TR worsened from previous echo in Oct 2021, severe PH w/RVSP 85 mmHg.  -Caution w/fluid balance in setting of restrictive physio from HCM  -Hold further diuresis, appears volume down (suspect elevated RAP is 2/2 PH rather than primary cardiac problem)    #Hx Afib  -Underwent pacemaker upgrade 12/9.   -Eliquis 5 mg q12h    #PAH- on chart review either group 1 or 2  -See resp      Resp:   #Acute hypoxic respiratory failure  #PAH/PH Group 1 vs. 2  Unclear etiology, patient does have history of cardiac arrest with previous anesthesia. Possible this was secondary to anesthesia with  increased afterload for r. Side. Pt previously with cardiac arrest periprocedurally, likely same etiology Doesn't appear  volume overloaded, no consolidations or pneumothorax on CXR. Hx of ILD and amiodarone toxicity with lungs. Shunt? A-a O2 gradient 303.7 (expected 22.5) suggesting shunt ddx dead space (PNA, asthma/COPD/PE); L->r shunt (edema, ards, pna) or alveolar hypoventilation (pulmonary fibrosis, ILD).   -HFNC @ FiO2 60% and flow 40 L/min  -Restarted home Uptravi (selexipag) 1,600 mcg po q12h yesterday, reduced to 1,200 mcg po q12h in light of worsening hypotension  -Advanced lung following, appreciate recs    #Congestion  -Flonase BID     #Throat pain  #Dysphagia  -2/2 trauma from multiple intubation, likely contusion w/laryngeal edema  -New exudates seen on b/l adenoids  -C/f avulsion or other trauma beyond contusion with possible seeding of infection  -Consult ENT for further evaluation/better examination       Renal /Fluid, Electrolytes:   #Hyperkalemia of 5.6  -Worsened from wnl to 5.6 despite getting lasix overnight  -Possibly 2/2 to ATN however developed prior to rise in Cr  -Kayexalate 15 g po x 1    #AKI on CKD  -Cr baseline 1.1  -Cr rose from baseline to 1.7 overnight in setting of lasix admin  -Likely ATN 2/2 cardiorenal syndrome and shock   -Management of underlying cause(s) as above  -Avoid nephrotoxic agents    #AGMA   #MA  -AG 16, HCO3 15  -Suspect 2/2 lactic acidosis w/lactic rising to 2.7 however does not necessarily explain degree of acidosis  -Na-HCO3 1,300 mg po QID      GI:   #Nutrition  -Passed bedside swallow study  -Advance diet to clears (pt preference) over     #PUD ppx  #GERD  -Takes protonix 40 mg po qD at home  -Pepcid 20 mg po qD (changed to po from IV today)    #Bowel regimen  -Pericolace 8.6-50 mg q12h       Infectious Disease (ID):   #Shock  -Possible sepsis, no clear source  -Blood cx x 2  -MRSA nares/throat  -Throat cx   -Sputum cx  -UA w/reflex to micro/cx  -CXR this AM not suggestive of pna but at risk for aspiration pna vs. Pneumonitis  -ENT eval as above   -Vanc IV per pharmacy (12/11 - ),  [place] pending MRSA swab results  -Zosyn 4.5 mg IV q8h (12/11 - )        Hem/Onc:   -Eliquis for VTE ppx in setting of Afib as above      Endo:   #Hypothyroidism  -Synthroid 50 mcg qD       Prophylaxis:  - DVT: Home eliquis  - GI:  Pepcid in place of home protonix    Code Status: DNR/DNI  Dispo: CCU    Lines/Drains/Airways:  -PIV x 2  -Foley     Safety Checklist:     DVT prophylaxis:  CHEST guideline (See page [id number]) Chemical and Mechanical   Foley: Not present   IVs:  Peripheral IV   PT/OT: Not needed   Daily CBC & or Chem ordered:  SHM/ABIM guidelines (see #5) Yes, due to clinical and lab instability   Reference for charges of common labs: CBC auto diff - $76   BMP - $99   Mg - $79        Signed by: Alyson Ingles, MD, MD  Date/Time: 03/17/20 8:12 AM
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
## 2020-03-17 NOTE — Plan of Care (Addendum)
ICU Checklist      Ventilator Bundle    Patient on ventilator: Yes    IF NO STOP HERE  Date Intubated: 12/9  Day#: 1  Ventilator/sedation order set initiated? Yes  Readiness to wean/extubate: SAT/SBT Yes  Patient intubated for >7 days, consider tracheostomy pl acement: Not Applicable  Pain target achieved (CPOT <3): Yes  Sedation Target: Rass target achieved? No        Line Bundle  Central line present: No    Date inserted: n/a  Type: n/a  Location: n/a  Can central line be removed?: Not Applicable   PIV x 3    Foley Bundle  Foley catheter present?: No    Date placed:  Can urinary catheter be removed: Not Applicable    General Care Bundle  Restraints Renewed?: Yes  Fluid balance achieved?: No  DVT Prophylaxis ordered?: Yes  If no, choose one of the following:   []  Active Bleed     []  Coagulopathy   []  Thrombocytopenia  []  Other:   Nutrition ordered?: No    If no, choose one of the following:  [x]  NPO    []  Aspiration risk   []  Ileus   []  Hemodynamically instable  []  Other:   Bowel movement in last 48 hours?: Yes  If no, bowel regimen in place?: Yes  PUD prophylaxis: Yes  Mobility protocol: PT/OT ordered? Yes

## 2020-03-18 ENCOUNTER — Inpatient Hospital Stay: Payer: Medicare Other

## 2020-03-18 DIAGNOSIS — Z45010 Encounter for checking and testing of cardiac pacemaker pulse generator [battery]: Secondary | ICD-10-CM | POA: Diagnosis not present

## 2020-03-18 DIAGNOSIS — I270 Primary pulmonary hypertension: Secondary | ICD-10-CM | POA: Diagnosis not present

## 2020-03-18 LAB — CBC AND DIFFERENTIAL
Absolute NRBC: 0.04 10*3/uL — ABNORMAL HIGH (ref 0.00–0.00)
Basophils Absolute Automated: 0.03 10*3/uL (ref 0.00–0.08)
Basophils Automated: 0.2 %
Eosinophils Absolute Automated: 0.04 10*3/uL (ref 0.00–0.44)
Eosinophils Automated: 0.3 %
Hematocrit: 31.3 % — ABNORMAL LOW (ref 34.7–43.7)
Hgb: 10.3 g/dL — ABNORMAL LOW (ref 11.4–14.8)
Immature Granulocytes Absolute: 0.09 10*3/uL — ABNORMAL HIGH (ref 0.00–0.07)
Immature Granulocytes: 0.7 %
Lymphocytes Absolute Automated: 0.65 10*3/uL (ref 0.42–3.22)
Lymphocytes Automated: 4.8 %
MCH: 32.1 pg (ref 25.1–33.5)
MCHC: 32.9 g/dL (ref 31.5–35.8)
MCV: 97.5 fL — ABNORMAL HIGH (ref 78.0–96.0)
MPV: 10.4 fL (ref 8.9–12.5)
Monocytes Absolute Automated: 0.99 10*3/uL — ABNORMAL HIGH (ref 0.21–0.85)
Monocytes: 7.4 %
Neutrophils Absolute: 11.65 10*3/uL — ABNORMAL HIGH (ref 1.10–6.33)
Neutrophils: 86.6 %
Nucleated RBC: 0.3 /100 WBC — ABNORMAL HIGH (ref 0.0–0.0)
Platelets: 191 10*3/uL (ref 142–346)
RBC: 3.21 10*6/uL — ABNORMAL LOW (ref 3.90–5.10)
RDW: 19 % — ABNORMAL HIGH (ref 11–15)
WBC: 13.45 10*3/uL — ABNORMAL HIGH (ref 3.10–9.50)

## 2020-03-18 LAB — VANCOMYCIN, RANDOM: Vancomycin Random: 14 ug/mL

## 2020-03-18 LAB — GFR: EGFR: 35.5

## 2020-03-18 LAB — BASIC METABOLIC PANEL
Anion Gap: 13 (ref 5.0–15.0)
BUN: 41 mg/dL — ABNORMAL HIGH (ref 7.0–19.0)
CO2: 20 mEq/L — ABNORMAL LOW (ref 22–29)
Calcium: 8.4 mg/dL (ref 7.9–10.2)
Chloride: 110 mEq/L (ref 100–111)
Creatinine: 1.7 mg/dL — ABNORMAL HIGH (ref 0.6–1.0)
Glucose: 105 mg/dL — ABNORMAL HIGH (ref 70–100)
Potassium: 3.7 mEq/L (ref 3.5–5.1)
Sodium: 143 mEq/L (ref 136–145)

## 2020-03-18 LAB — MAGNESIUM: Magnesium: 2 mg/dL (ref 1.6–2.6)

## 2020-03-18 LAB — LACTIC ACID, PLASMA
Lactic Acid: 1.1 mmol/L (ref 0.2–2.0)
Lactic Acid: 1.3 mmol/L (ref 0.2–2.0)
Lactic Acid: 1.2 mmol/L (ref 0.2–2.0)

## 2020-03-18 LAB — PHOSPHORUS: Phosphorus: 4.5 mg/dL (ref 2.3–4.7)

## 2020-03-18 LAB — MRSA CULTURE
Culture MRSA Surveillance: NEGATIVE
Culture MRSA Surveillance: NEGATIVE

## 2020-03-18 MED ORDER — SODIUM BICARBONATE 650 MG PO TABS
650.0000 mg | ORAL_TABLET | Freq: Four times a day (QID) | ORAL | Status: DC
Start: 2020-03-18 — End: 2020-03-22
  Administered 2020-03-18 – 2020-03-22 (×16): 650 mg via ORAL
  Filled 2020-03-18 (×16): qty 1

## 2020-03-18 MED ORDER — FUROSEMIDE 10 MG/ML IJ SOLN
40.0000 mg | Freq: Once | INTRAMUSCULAR | Status: AC
Start: 2020-03-18 — End: 2020-03-18
  Administered 2020-03-18: 09:00:00 40 mg via INTRAVENOUS
  Filled 2020-03-18: qty 4

## 2020-03-18 MED ORDER — POTASSIUM CHLORIDE 20 MEQ PO PACK
40.0000 meq | PACK | Freq: Once | ORAL | Status: AC
Start: 2020-03-18 — End: 2020-03-18
  Administered 2020-03-18: 06:00:00 40 meq via ORAL
  Filled 2020-03-18: qty 2

## 2020-03-18 NOTE — Progress Notes (Signed)
Patient's Name: [name]   Admit Date: 03/31/2020  Medical Record Number: [id number]   Code Status: No CPR with SUP  Room: FI102/FI102-01  Date/Time: 03/19/20 12:06 PM  Cardiologist of record: IMG    24 Hour Events:    - Provided Lasix 40 mg IV x2.  - Initiated on sodium bicarbonate tablets.    IMPRESSIONS:      Presented for electivechange of single-chamber pacemaker to dual-chamber pacemaker requiring intubation for the procedure.   Post extubation developed hypoxic respiratory failure and was reintubated- continued to be hypoxic even on 100% FiO2, now improved with Veletri. Mostly related to hx of severe PH.    Brief hypotension requiring pressor support, now off pressors.   Severe pulmonary hypertension (Group 2 +/- 1)   RHC (06/26/16): RA 4, PA 69/23/38, PCWP 8, TPG 30, Fick CO 4.15 and PVR 7.3, thermal CO 3.14 and PVR 9.6.   Treated with macitentan previously.She did not tolerate tadalafil, selexipag was subsequently added.A trial of riociguat was stopped due to hypotension.    RHC 01/24/20: RA 10, RV 79/5, PA 83/38/53, PCWP 14.    With exercise her PCWP increased to 21 mm hg. She has low output state predominantly driven by her severe pulmonary hypertension   Apical hypertrophic cardiomyopathy with apical aneurysm   TTE (12/10): LVEF 55-60%, hypertrophic cardiomypathy, LA moderately dilated, no evidence of shunt, severe TR, severe PH RVSP 85 mmHg, grade III diastolic dysfunction     pAF s/p prior CTI ablation and PV isolation. 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   Continue Midodrine 10mg  PO TID today - goal MAP >37mmHg   Continue Eliquis 5mg  PO BID for paroxysmal AF   Will aim to wean nitric oxide today   Continue home Uptravi   Advanced lung consulted, appreciate recommendations   Rest of care per [facility] team    Discussed with team, [facility], RN, patient    Signed: Viann Shove, MD  PGY-4    Signed by: Clayborne Artist    Date/Time: 03/19/20 12:06 PM  ---------------------------------------------------------------------------  CICU Cardiology Attending addendum:    Patient seen/examined with cardiology fellow Dr. Dalene Carrow.   Exam notable for RRR, normal S1-S2, clear lungs.   24 hour events: No events overnight  My assessment/plan as noted above with the following additions:    Impressions:  74 year old female with apical hypertrophic cardiomyopathy with apical aneurysm, severe group 2 pulmonary hypertension, possible ILD, PAF s/p prior ablation/PVI isolation, prior GI bleed, Jehovah's Witness, OSA, admitted to the CICU for hypoxic respiratory failure requiring intubation in the setting of a dual-chamber pacemaker upgrade.  CICU course notable for subsequent extubation.    Recommendations:   Continue midodrine   Infectious work-up pending   Continued goals of care discussion.  Patient has elected to be DNR/DNI   Diuresis as needed, will give lasix 40mg  IV this morning   Continue selexipag   Continue apixaban for PAF   OK for transfer out of the CICU to  APU if able to continue weaning the nitric oxide.    Patient independently examined by me.  Pertinent medical records, studies, labs, imaging studies independently reviewed by me. Treatment of an organ system (cardiac) with high probability of life-threatening deterioration and need for life-saving interventions. Critical care time spent excluding procedures/teaching 30 minutes from 10AM to 10:30AM.     Dr. Gwynneth Macleod. Nash Shearer, MD [facility]  CICU Cardiology Attending  Spectralink  [phone]      ----------------------------    History: [name] is a 74 y.o. female. She has a past medical history of pAFib (On AC) s/p prior CTI ablation/PV isolation and AV junction ablation/implantation of VVI pacemaker, heart failure with preserved ejection fraction due to Apical hypertrophic cardiomyopathy with apical aneurysm, CKD, COPD, COVID-19 (06/2019), disorder of thyroid, gastrointestinal hemorrhage, severe pulmonary hypertension (On oxygen by nasal cannula), OSA on CPAP (CPAP since 2015).    She presented on 04-12-20 for elective change of single-chamber pacemaker to dual-chamber pacemaker. Per anesthesia reports, patient required intubation under LMA sedation. After extubation she developed hypoxemia and respiratory distress and had to be reintubated.[facility] consulted admitted to CICU for post procedure respiratory failure and hypoxemia and management.    On arrival to the CICU she was very hypoxemic with saturation down to 70s on 100% FiO2 and PEEP of 8. Stat chest x-ray did not show pneumothorax.ET tube in appropriate position. Stat sonographic examination of heart did not show any pericardial tamponade. LV function at baseline.    Objective:    Physical Exam:    Vital signs in last 24 hours:     Temp:  [98.1 F (36.7 C)-99 F (37.2 C)] 99 F (37.2 C)  Heart Rate:  [69-71] 69  Resp Rate:  [21-33] 21  BP: (92-129)/(51-65) 118/64  FiO2:  [79 %-80 %] 80 %  SpO2: 95 %   O2 Device: HFNC  Height: 161.3 cm (5' 3.5")    Intake/Output:    Intake/Output Summary (Last 24 hours) at 03/19/2020 1206  Last data filed at 03/19/2020 1100  Gross per 24 hour   Intake 830 ml   Output 1515 ml   Net -685 ml     Net IO Since Admission: -2,557.46 mL [03/19/20 1206]    Weight:     Wt Readings from Last 4 Encounters:   03/18/20 85.1 kg (187 lb 9.8 oz)   03/07/20 82.1 kg (181 lb)   03/02/20 82.8 kg (182 lb 9.6 oz)   02/22/20 82.6 kg (182 lb)        Vitals:    03/18/20 0829   BP:    Pulse:    Resp:     Temp:    SpO2: 94%         General: HFNC, yet awake  Neck: Trachea midline, JVD   Pulmonary: Normal respiratory effort, clear lung sounds bilaterally.  Cardiovascular: PMI nondisplaced with normal rate and rhythm, normal S1, normal S2, no murmur, rub, or gallop appreciated.  Abdomen: Soft, nontender.  Extremities: No edema, no clubbing, no cyanosis.      Laboratory:      Basic Metabolic Panel:         03/19/20  0447 03/18/20  0342 03/17/20  1506 03/17/20  0441 03/16/20  0303   Creatinine 1.4* 1.7* 2.0*   < > 1.1*   BUN 39.0* 41.0* 39.0*   < > 20.0*   CO2 22 20* 17*   < > 14*   Sodium 148* 143 143   < >  136   Potassium 3.8 3.7 4.2   < > 5.5*   Magnesium 2.0 2.0 2.0   < > 2.0   Phosphorus 3.9 4.5  --   --  5.2*   Chloride 112* 110 110   < > 110   Calcium 8.8 8.4 8.6   < > 8.8    < > = values in this interval not displayed.     Liver Function Test:        03/16/20  0303 03/22/2020  2031   AST (SGOT) 29 26   ALT 22 25   Bilirubin, Total 0.6 0.5     Complete Blood Count:        03/18/20  0342 03/17/20  0441 03/16/20  0303   WBC 13.45* 13.19* 9.39   Hgb 10.3* 11.7 10.9*   Hematocrit 31.3* 36.4 35.2   Platelets 191 231 197     Cardiac Markers:        03/16/20  1806   B-Natriuretic Peptide 186*     Imaging:   Radiology Results (24 Hour)     Procedure Component Value Units Date/Time    XR Chest AP Portable [[id number]] Collected: 03/19/20 0851    Order Status: Completed Updated: 03/19/20 0855    Narrative:      CLINICAL HISTORY: Pneumonia and pulmonary edema.    COMPARISON: 03/18/2020    FINDINGS: Single AP portable view of the chest was exposed. The heart is  enlarged. There are pacing wires overlying the right atrium and right  ventricle. No pneumothorax is identified. There is pulmonary vascular  congestion consistent with pulmonary edema. There are bilateral pleural  effusions left greater than right. There is multifocal patchy airspace  consolidation which could be due to edema or pneumonia. Mediastinal  structures  remain stable.      Impression:      Findings of pulmonary edema, bilateral pleural effusions and  multifocal airspace opacity.    Colonel Bald, MD   03/19/2020 8:53 AM        Cardiographics:     Echocardiogram (03/16/2020):       * Left ventricular systolic function is normal with an ejection fraction of 55 to 60%.    * Increased left ventricular wall thickness towards the apex,morphology suggestive of apical variant hypertrophic cardiomyopathy.    * Decreased right ventricular systolic function.    * The left atrium is moderately dilated.    * The right atrium is dilated.    * No evidence of interatrial shunt by color Doppler or agitated saline (with and without provocative maneuver).    * There is severe tricuspid regurgitation.    * Severe pulmonary hypertension with estimated right ventricular systolic pressure of  85 mmHg.    * The IVC is dilated with < 50% respiratory variance consistent with significantly elevated RA pressure of 15 mmHg.    * Small posterior pericardial effusion visualized.    * Compared to the prior study on 02/02/2020,  TR is worse.    Inpatient Medications:    Scheduled:      albuterol, 2.5 mg, Nebulization, BID    apixaban, 5 mg, Oral, Q12H SCH    famotidine, 20 mg, Oral, Daily    fluticasone, 1 spray, Each Nare, Daily    furosemide, 40 mg, Intravenous, Once    levothyroxine, 50 mcg, Oral, Daily at 0600    midodrine, 10 mg, Oral, TID MEALS    perflutren protein A microsph, 3 mL, Intravenous, Once  piperacillin-tazobactam, 4.5 g, Intravenous, Q8H    selexipag, 1,200 mcg, Oral, Q12H SCH    senna-docusate, 1 tablet, Oral, Q12H SCH    sodium bicarbonate, 650 mg, Oral, QID    Infusions:      Home Medications     Med List Status: Complete Set By: Tania Ade, RN at 03/23/2020  1:48 PM                allopurinol (ZYLOPRIM) 100 MG tablet     Take 1 tablet (100 mg total) by mouth daily Per pt taking 50mg  (half tab)     Notes:  Take morning of procedure per MD     apixaban  (ELIQUIS) 5 MG     Take 1 tablet (5 mg total) by mouth every 12 (twelve) hours     Notes:  Last dosage 03/11/2020 p.m per MD     atorvastatin (LIPITOR) 80 MG tablet     Take 80 mg by mouth every evening        colchicine 0.6 MG tablet     Take 1 tablet (0.6 mg total) by mouth daily     Patient taking differently: Take 0.6 mg by mouth every morning        Notes:  Take morning of procedure per MD     ezetimibe (ZETIA) 10 MG tablet     Take 10 mg by mouth nightly        ferrous sulfate 325 (65 FE) MG tablet     Take 325 mg by mouth Once every Monday, Wednesday and Friday morning Taking on Monday wed, friday / severe GI bleed 11/2019       Notes:  Hold morning of procedure per guidelines         furosemide (LASIX) 40 MG tablet     Take 1 tablet (40 mg total) by mouth 2 (two) times daily     Patient taking differently: Take 40 mg by mouth every morning        Notes:  Hold morning of procedure per MD       levothyroxine (SYNTHROID) 50 MCG tablet     Take 50 mcg by mouth Once a day at 6:00am     Notes:  Take morning of procedure per MD     Multiple Vitamin (multivitamin) capsule     Take 1 capsule by mouth daily     Notes:  Hold 7 days prior to procedure per guidlines     OXYGEN-HELIUM IN     Inhale 4-6 L/min into the lungs        pantoprazole (PROTONIX) 40 MG tablet     Take 40 mg by mouth nightly        potassium chloride (K-DUR) 10 MEQ tablet     Take 10 mEq by mouth daily        Notes:  Hold morning of procedure per guidelines     Selexipag (Uptravi) 1600 MCG Tab     Take 1,600 mcg by mouth 2 (two) times daily     Patient taking differently: Take 1,600 mcg by mouth 2 (two) times daily        Notes:  Take morning of procedure per MD     spironolactone (ALDACTONE) 25 MG tablet     Take 1 tablet (25 mg total) by mouth 2 (two) times daily     Patient taking differently: Take 25 mg by mouth daily        Notes:  Hold morning  of procedure per MD     vitamin D (CHOLECALCIFEROL) 25 MCG (1000 UT) tablet     Take 2,000 Units by  mouth daily     Notes:  Hold 7 days prior to procedure per guidlines

## 2020-03-18 NOTE — Progress Notes (Signed)
ICU/CCU Daily Progress Note    Patient's Name: [name]    Room:  WG956/OZ308-65  Attending Provider: August Luz, MD  Admit Date:03/23/2020  Medical Record Number: [id number]     Date/Time: 03/18/20 8:15 AM    74 year old female w/ hx afib, CHF, group 1 or 2 PAH, HCM, COPD, ILD, cardiac arrest after anesthesia presents with profound acute hypoxic respiratory failure after extubation following elective upgrade of single chamber pacemaker to dual chamber pacemaker.      24hr events:  NAEON. CPAP overnight. Yesterday seen by ENT dx w/anterior tonsillar pillar avulsion likely 2/2 intubation.     This AM seen at bedside. Worsening productive cough of copious green sputum. Some dried blood in her nose as well. She feels weaker and more tired compared to yesterday.     Patient Active Problem List   Diagnosis    DOE (dyspnea on exertion)    OSA (obstructive sleep apnea)    Apical variant hypertrophic cardiomyopathy    Hypothyroidism    Pacemaker    Pulmonary hypertension, primary    Patient is Jehovah's Witness    Pacemaker generator end of life    Encounter for pacemaker at end of battery life       VITAL SIGNS PHYSICAL EXAM   Temp:  [97.5 F (36.4 C)-97.8 F (36.6 C)] 97.8 F (36.6 C)  Heart Rate:  [69-71] 69  Resp Rate:  [20-40] 21  BP: (83-122)/(48-60) 92/52  FiO2:  [59 %-80 %] 80 %   Pulse ox: 88-100%  MAP 65-80    Blood Glucose: 105    Telemetry: Intermittent Afib overnight. NSR currently.    Vent Settings  Vent Mode: PS/CPAP in NIV  FiO2: 80 %  Resp Rate (Set): 0  Vt (Set, mL): 0 mL  PIP Observed (cm H2O): 12 cm H2O  PEEP/EPAP: 6 cm H20  Pressure Support / IPAP: 6 cmH20  Mean Airway Pressure: 8 cmH20    Intake/Output Summary (Last 24 hours) at 03/18/2020 5284  Last data filed at 03/18/2020 0700  Gross per 24 hour   Intake 620 ml   Output 873 ml   Net -253 ml    Physical Exam  General: mild distress 2/2 coughing fits and copious sputum production  Neuro: A&O x 4  Cardiac: RRR, no  m/r/g  Lungs: Coarse rhonchi throuhgout the lung fields most prominent in the RUL.  Abdomen: soft, NT, ND  Ext: warm and dry, no LE edema          Scheduled Meds: PRN Meds:    apixaban, 5 mg, Oral, Q12H SCH  famotidine, 20 mg, Oral, Daily  fluticasone, 1 spray, Each Nare, Daily  levothyroxine, 50 mcg, Oral, Daily at 0600  midodrine, 10 mg, Oral, TID MEALS  perflutren protein A microsph, 3 mL, Intravenous, Once  piperacillin-tazobactam, 4.5 g, Intravenous, Q8H  selexipag, 1,200 mcg, Oral, Q12H SCH  senna-docusate, 1 tablet, Oral, Q12H SCH  sodium bicarbonate, 1,300 mg, Oral, QID  vancomycin, , Intravenous, See Admin Instructions        Continuous Infusions:   acetaminophen, 650 mg, Q8H PRN  benzocaine-menthol, 1 lozenge, Q1H PRN  calcium carbonate, 500 mg, 4X Daily PRN  lidocaine viscous, 5 mL, Q6H PRN  saline, 1 spray, PRN            Labs (last 72 hours):  Recent Labs     03/18/20  0342 03/17/20  0441 03/16/20  0303   WBC 13.45* 13.19* 9.39  Hgb 10.3* 11.7 10.9*   Hematocrit 31.3* 36.4 35.2   Plt 191     No results for input(s): PT, INR, PTT in the last 72 hours.     Lactic Acid 3.3 -> 2.8 -> 1.1    BNP 186 (103) 12/11    AST 29  ALT 22  ALP 146     Procal 0.94 Recent Labs     03/18/20  0342 03/17/20  1506 03/17/20  0441 03/16/20  0303 03/16/20  0303   Sodium 143 143 140   < > 136   Potassium 3.7 4.2 5.6*   < > 5.5*   Chloride 110 110 109   < > 110   CO2 20* 17* 15*   < > 14*   BUN 41.0* 39.0* 32.0*   < > 20.0*   Creatinine 1.7* 2.0* 1.7*   < > 1.1*   Glucose 105* 102* 104*   < > 131*   Calcium 8.4 8.6 8.9   < > 8.8   Magnesium 2.0 2.0 1.9   < > 2.0   Phosphorus 4.5  --   --   --  5.2*    < > = values in this interval not displayed.     UA  -LE moderate  -Nitrite negative  -Protein 100  -Blood moderate  -RBC 26-50  -WBC 26-50  -Hyaline casts TNTC     ABG: no new    Microbiology:   12/11 Sputum stain/cx: mixed respiratory flora  12/11 MRSA nares/throat pending  12/11 Urine cx pending  12/11 Throat cx pending  12/11  Blood cx pending     Imaging:  XR Chest AP Portable    Result Date: 03/18/2020    CHF Gerlene Burdock, MD  03/18/2020 8:17 AM     TTE 12/10    * Left ventricular systolic function is normal with an ejection fraction of  55 to 60%.    * Increased left ventricular wall thickness towards the apex,    * morphology suggestive of apical    * variant hypertrophic cardiomyopathy.    * Decreased right ventricular systolic function.    * The left atrium is moderately dilated.    * The right atrium is dilated.    * No evidence of interatrial shunt by color Doppler or agitated saline (with  and without provocative maneuver).    * There is severe tricuspid regurgitation.    * Severe pulmonary hypertension with estimated right ventricular systolic  pressure of  85 mmHg.    * The IVC is dilated with < 50% respiratory variance consistent with  significantly elevated RA pressure of 15 mmHg.    * Small posterior pericardial effusion visualized.    * Compared to the prior study on 02/02/2020,  TR is worse.      Assessment and Plan:  74 y.o. female with PMH of Afib, CHF 2/2 HCM, Group 1/2 PAH/PH, COPD, ILD, cardiac arrest after anesthesia who presented on 03/27/2020 with profound acute hypoxic respiratory failure after extubation following elective upgrade of single to dual chamber pacemaker. Now with evidence of shock of unclear etiology cardiogenic vs. distributive less likely hypovolemic.      Neuro:   NAI    #Encephalopathy - resolved    Cardio:   #Shock  -Borderline hypotension w/evidence of end organ ischemia w/rising lactic acid and AKI.  -Etiology unclear, cardiogenic vs. distributive 2/2 sepsis; no evidence of hypovolemic/hemorrhagic shock.  -Infectious w/u as below  -Midodrine 10 mg  TID for pressure support; may help with systemic vasodilation 2/2 PH meds    #HCM  #HFpEF (50%)  -Repeat echo shows LVEF 55-60% and severe TR worsened from previous echo in Oct 2021, severe PH w/RVSP 85 mmHg.  -Caution w/fluid balance in setting of  restrictive physio from HCM  -Gentle diuresis today with goal net neutral to slight net negative 500 mL => IV lasix 40 mg x 1    #Hx Afib  -Underwent pacemaker upgrade 12/9.   -Eliquis 5 mg q12h    #PAH- on chart review either group 1 or 2  -See resp      Resp:   #Acute hypoxic respiratory failure  #PAH/PH Group 1 vs. 2  Unclear etiology, patient does have history of cardiac arrest with previous anesthesia. Possible this was secondary to anesthesia with increased afterload for r. Side. Pt previously with cardiac arrest periprocedurally, likely same etiology Doesn't appear volume overloaded, no consolidations or pneumothorax on CXR. Hx of ILD and amiodarone toxicity with lungs. Shunt? A-a O2 gradient 303.7 (expected 22.5) suggesting shunt ddx dead space (PNA, asthma/COPD/PE); L->r shunt (edema, ards, pna) or alveolar hypoventilation (pulmonary fibrosis, ILD).   -HFNC @ FiO2 70% and flow 40 L/min  -C/w CPAP at nighttime while sleeping  -Wean NO as tolerated  -Uptravi (selexipag) 1,600 mcg po q12h 12/10, reduced to 1,200 mcg po q12h in light of worsening hypotension  -Advanced lung following, appreciate recs    #Congestion  -Flonase BID   -Nasal saline prn    #Anterior tonsillar pillar avulsion  -Likely 2/2 intubation trauma  -New exudates seen on b/l adenoids  -ENT evaluated, recommend conservative mgmt      Renal /Fluid, Electrolytes:   #AKI on CKD  -Cr baseline 1.1  -Cr rose from baseline to 1.7 overnight in setting of lasix admin  -Likely ATN 2/2 cardiorenal syndrome and shock vs. developing prerenal component BUN:Cr rising now > 20  -Management of underlying cause(s) as above  -Avoid nephrotoxic agents    #AGMA - resolved  #MA - improved   -HCO3 20  -AGMA 2/2 lactic acidosis now resolved  -MA 2/2 unknown etiology. Possibly RTA? Not hyperalimentation, acetazolamide, diarrhea, ureterosigmoidostomy, pancreatic fistula, or pirmary hyperparathyroidism  -Na-HCO3 1,300 mg po QID, decrease to 650 mg QID    #Hyperkalemia -  resolved  -Possibly 2/2 to ATN however developed prior to rise in Cr  -S/p kayexalate 15 g po      GI:   #Nutrition  -Passed bedside swallow study  -Clear liquid diet (pt preference)     #PUD ppx  #GERD  -Takes protonix 40 mg po qD at home  -Pepcid 20 mg po qD     #Bowel regimen  -Pericolace 8.6-50 mg q12h       Infectious Disease (ID):   #Shock  -Possible sepsis, no clear source  -F/u blood cx x 2  -F/u MRSA nares/throat  -F/u throat cx   -Sputum cx mixed upper respiratory flora  -UA w/reflex to micro/cx  -CXR this AM not suggestive of pna but at risk for aspiration pna vs. Pneumonitis  -ENT eval as above   -Vanc IV per pharmacy (12/11 - 12/12 ), [place] given negative MRSA swab  -Zosyn 4.5 mg IV q8h (12/11 - )        Hem/Onc:   -Eliquis for VTE ppx in setting of Afib as above      Endo:   #Hypothyroidism  -Synthroid 50 mcg qD       Prophylaxis:  -  DVT: Home eliquis  - GI:  Pepcid in place of home protonix    Code Status: DNR/DNI  Dispo: CCU    Lines/Drains/Airways:  -PIV x 2  -Foley     Safety Checklist:     DVT prophylaxis:  CHEST guideline (See page [id number]) Chemical and Mechanical   Foley: Not present   IVs:  Peripheral IV   PT/OT: Not needed   Daily CBC & or Chem ordered:  SHM/ABIM guidelines (see #5) Yes, due to clinical and lab instability   Reference for charges of common labs: CBC auto diff - $76   BMP - $99   Mg - $79        Signed by: Alyson Ingles, MD, MD  Date/Time: 03/18/20 8:19 AM
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
## 2020-03-18 NOTE — Plan of Care (Addendum)
Assumed care 1900.     Pt on HFNC 50l/60% during day NIPPV at night with nitric.     No acute changes overnight.     Problem: Non-Violent Restraints Interdisciplinary Plan  Goal: Will be injury free during the use of non-violent restraints  Outcome: Progressing     Problem: Compromised Tissue integrity  Goal: Damaged tissue is healing and protected  Outcome: Progressing  Goal: Nutritional status is improving  Outcome: Progressing     Problem: Moderate/High Fall Risk Score >5  Goal: Patient will remain free of falls  Outcome: Progressing     Problem: Safety  Goal: Patient will be free from injury during hospitalization  Outcome: Progressing  Goal: Patient will be free from infection during hospitalization  Outcome: Progressing     Problem: Pain  Goal: Pain at adequate level as identified by patient  Outcome: Progressing     Problem: Side Effects from Pain Analgesia  Goal: Patient will experience minimal side effects of analgesic therapy  Outcome: Progressing     Problem: Psychosocial and Spiritual Needs  Goal: Demonstrates ability to cope with hospitalization/illness  Outcome: Progressing     Problem: Discharge Barriers  Goal: Patient will be discharged home or other facility with appropriate resources  Outcome: Progressing

## 2020-03-18 NOTE — Plan of Care (Signed)
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  Pf note, pt is a Jehovah's witness and declines blood transfusions.    Neuro:  - pt wishes to be DNR/DNI  - consult Palliative    CV:  - decreased home selexipag, given hypotension  - weaning iNO gradually: 20 --> 15 ppm  - BP not that far from baseline (~100/60)  - LA has cleared w/ backing off diuresis, starting midodrine and antibiotics  - midodrine 10 TID (relatively preserved LV function)  - very low cardiac output at baseline, hence upgrade from single-chamber pacemaker to dual-chamber pacemaker (in hopes synchronization would augment her CO)  - TTE w/ bubble: LVEF 55-60%, apical variant HCM, grade III diastolic dysfunction, no evidence of interatrial shunt, severe PH RVSP 85, severe TR, TAPSE 0.70, small posterior pericardial effusion  - Echo w/ bubble 09/08/17 found no evidence of right to left shunt, though AF ablation procedure in 2017 included transseptal puncture  - cautious w/ diuresis considering HCM    Pulm:  - CXR w/ prominent vasculature and new opacities  - spot diuresis as tolerated  - starting abx 12/11,  given productive sputum  - CPAP QHS  - previously: HRCT without ILD or pulmonary fibrosis; dual energy CTA negative for clots  - Advanced Lung following    ID: NAI  - now w/ productive cough; pt may have aspirated during anesthesia w/ LMA  - start Zosyn and vancomycin  - pan-culture  - UA+, follow up UCx  - also w/ sore throat and erythema / pus on right posterior pharynx, perhaps secondary to trauma w/ intubation  - ENT performed rhinolaryngoscopy: anterior tonsillar pillar avulsion likely secondary to intubation  - f/u throat culture    Renal/Fluid, Electrolytes:  - NAGMA, unclear etiology  - bicarb tabs  - net neg 1L yest (Lasix 40, 40, 80)  - worsening AKI and hypotension 12/11: held diuresis  - Lasix 40 12/12 given improved hemodynamics  - s/p kayexalate x 1 for hyper-K+    Heme:  - c/w home Eliquis (AF)  - pt is a Jehovah's witness and declines blood transfusions    GI/Nurition:    Endo:  - c/w home [email or web address]    Lines/Tubes:   - none    PPx:   -DVT: Eliquis   -GI: famotidine  Code status: DNR / DNI

## 2020-03-18 NOTE — Plan of Care (Signed)
ICU Checklist      Ventilator Bundle    1. Patient on ventilator: []  Y [x]  N     Vascular Bundle    1. Central line present: []  Y [x]  N    General Care Bundle    1. Foley catheter present?: [x]  Y []  N   2. Date placed: 12/11  Day#1    3. Can urinary catheter be removed: []  Y [x]  N []  N/A     4. Restraints Renewed?: []  Y []  N [x]  N/A     5. Antibiotic Stewardship achieved?: [x]  Y []  N []  N/A     6. Fluid balance achieved?: []  Y [x]  N []  N/A   If no, fluids or diuretics adjusted?: [x]  Y []  N     7. Glycemic control achieved: [x]  Y []  N []  N/A     8. Redundant orders reviewed?: [x]  Y []  N []  N/A     9. DVT Prophylaxis ordered?: [x]  Y []  N If no, choose one of the following:   []  Active Bleed     []  Coagulopathy   []  Thrombocytopenia  []  Other:     10. Nutrition ordered?: [x]  Y []  N  If no, choose one of the following:  []  NPO    []  Aspiration risk   []  Ileus   []  Hemodynamically instable  []  Other:     11. Bowel movement in last 48 hours?: [x]  Y []  N  If no, bowel regimen in place?: []  Y []  N    12. PUD prophylaxis: [x]  Y []  N []  Not indicated    13. Mobility protocol: PT/OT ordered? [x]  Y []  N    14. Family updated: [x]  Y []  N []  N/A      Leslee Home, PA-C  [facility]

## 2020-03-19 ENCOUNTER — Inpatient Hospital Stay: Payer: Medicare Other

## 2020-03-19 DIAGNOSIS — E038 Other specified hypothyroidism: Secondary | ICD-10-CM

## 2020-03-19 DIAGNOSIS — Z789 Other specified health status: Secondary | ICD-10-CM

## 2020-03-19 DIAGNOSIS — R0600 Dyspnea, unspecified: Secondary | ICD-10-CM

## 2020-03-19 DIAGNOSIS — I272 Pulmonary hypertension, unspecified: Secondary | ICD-10-CM

## 2020-03-19 DIAGNOSIS — Z515 Encounter for palliative care: Secondary | ICD-10-CM

## 2020-03-19 DIAGNOSIS — Z45010 Encounter for checking and testing of cardiac pacemaker pulse generator [battery]: Secondary | ICD-10-CM | POA: Diagnosis not present

## 2020-03-19 DIAGNOSIS — J9601 Acute respiratory failure with hypoxia: Secondary | ICD-10-CM | POA: Diagnosis not present

## 2020-03-19 DIAGNOSIS — I270 Primary pulmonary hypertension: Secondary | ICD-10-CM | POA: Diagnosis not present

## 2020-03-19 DIAGNOSIS — I422 Other hypertrophic cardiomyopathy: Secondary | ICD-10-CM | POA: Diagnosis not present

## 2020-03-19 LAB — URINALYSIS, REFLEX TO MICROSCOPIC EXAM IF INDICATED
Bilirubin, UA: NEGATIVE
Glucose, UA: NEGATIVE
Ketones UA: NEGATIVE
Leukocyte Esterase, UA: NEGATIVE
Nitrite, UA: NEGATIVE
Protein, UR: NEGATIVE
Specific Gravity UA: 1.009 (ref 1.001–1.035)
Urine pH: 5 (ref 5.0–8.0)
Urobilinogen, UA: NORMAL mg/dL (ref 0.2–2.0)

## 2020-03-19 LAB — BASIC METABOLIC PANEL
Anion Gap: 14 (ref 5.0–15.0)
BUN: 39 mg/dL — ABNORMAL HIGH (ref 7.0–19.0)
CO2: 22 mEq/L (ref 22–29)
Calcium: 8.8 mg/dL (ref 7.9–10.2)
Chloride: 112 mEq/L — ABNORMAL HIGH (ref 100–111)
Creatinine: 1.4 mg/dL — ABNORMAL HIGH (ref 0.6–1.0)
Glucose: 100 mg/dL (ref 70–100)
Potassium: 3.8 mEq/L (ref 3.5–5.1)
Sodium: 148 mEq/L — ABNORMAL HIGH (ref 136–145)

## 2020-03-19 LAB — PHOSPHORUS: Phosphorus: 3.9 mg/dL (ref 2.3–4.7)

## 2020-03-19 LAB — LACTIC ACID, PLASMA
Lactic Acid: 1.1 mmol/L (ref 0.2–2.0)
Lactic Acid: 2.8 mmol/L — ABNORMAL HIGH (ref 0.2–2.0)
Lactic Acid: 1.7 mmol/L (ref 0.2–2.0)

## 2020-03-19 LAB — MAGNESIUM: Magnesium: 2 mg/dL (ref 1.6–2.6)

## 2020-03-19 LAB — GFR: EGFR: 44.4

## 2020-03-19 MED ORDER — AYR SALINE NASAL NA GEL
NASAL | Status: DC | PRN
Start: 2020-03-19 — End: 2020-03-26
  Filled 2020-03-19: qty 1

## 2020-03-19 MED ORDER — FUROSEMIDE 10 MG/ML IJ SOLN
40.0000 mg | Freq: Once | INTRAMUSCULAR | Status: AC
Start: 2020-03-19 — End: 2020-03-19
  Administered 2020-03-19: 02:00:00 40 mg via INTRAVENOUS
  Filled 2020-03-19: qty 4

## 2020-03-19 MED ORDER — POTASSIUM CHLORIDE CRYS ER 20 MEQ PO TBCR
40.0000 meq | EXTENDED_RELEASE_TABLET | Freq: Once | ORAL | Status: AC
Start: 2020-03-19 — End: 2020-03-19
  Administered 2020-03-19: 07:00:00 40 meq via ORAL
  Filled 2020-03-19: qty 2

## 2020-03-19 MED ORDER — ONDANSETRON HCL 4 MG/2ML IJ SOLN
4.0000 mg | Freq: Three times a day (TID) | INTRAMUSCULAR | Status: DC | PRN
Start: 2020-03-19 — End: 2020-03-26

## 2020-03-19 MED ORDER — FUROSEMIDE 10 MG/ML IJ SOLN
40.0000 mg | Freq: Once | INTRAMUSCULAR | Status: AC
Start: 2020-03-19 — End: 2020-03-19
  Administered 2020-03-19: 13:00:00 40 mg via INTRAVENOUS
  Filled 2020-03-19: qty 4

## 2020-03-19 MED ORDER — ALBUTEROL SULFATE (2.5 MG/3ML) 0.083% IN NEBU
2.5000 mg | INHALATION_SOLUTION | Freq: Two times a day (BID) | RESPIRATORY_TRACT | Status: DC
Start: 2020-03-19 — End: 2020-03-20
  Administered 2020-03-19 – 2020-03-20 (×3): 2.5 mg via RESPIRATORY_TRACT
  Filled 2020-03-19 (×3): qty 3

## 2020-03-19 MED ORDER — PHENOL 1.4 % MT LIQD
1.0000 | OROMUCOSAL | Status: DC | PRN
Start: 2020-03-19 — End: 2020-03-26
  Administered 2020-03-20: 10:00:00 1 via ORAL
  Filled 2020-03-19 (×3): qty 177
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
## 2020-03-19 NOTE — Addendum Note (Signed)
Addended by: Lazarus Salines on: 03/19/2020 02:45 PM     Modules accepted: Orders

## 2020-03-19 NOTE — ACP (Advance Care Planning) (Signed)
Patient Care Conference Note    Patient Name: [name] Axon:      Meeting Date: 03/19/20         Purpose of Meeting  Discuss Goals of care  Discuss Plan of care  Discuss code status    Meeting Participants   Patient  and Child (ren)    Medical Decision Maker      Does Burman Nieves Best Axon have medical decision-making capacity?  Yes   The patient did  participate in the meeting.      Summary of Medical Condition/ Treatment Options/ Prognosis  [name] 74 y.o. female with PAHon home O2, PAF s/p ablation on eliquis, dCHF and hypertrophic cardiomyopathy and question of ILD as well asreports if auto-immune lung condition who presented for an elective single-to-dual champer ICD upgrade whose course was c/b hypotension/hypoxia, now extubated POD1 but remains on HFNC requiring inhaled nitric oxide      Patient's or Decision Maker's Perspective, Wishes, and Goals for Treatment  "My goal is to go home and live"  "I am realistic and know that I am very sick and have limited options"  Would like open/honest updates as to how she is doing and whether her goal of getting home is realistic  Declines intubation and CPR    Code Status and Medical Interventions Discussed   Code Status (patient has no pulse)  CPR - Comments: no CPR  NO intubation  Support otherwise OK        Advance Directive Documentation  Existing Documents that were Reviewed/Discussed with Patient/Decision Maker:  None  Documents Filled Out as Part of Discussion:                Physician Orders for Scope of Treatment (POST), Advance Directive/Living Will and Power of Attorney for Health Care    Medical Decisions and Plan of Care  Advance directive completed with mPOA  No CPR  No intubation  Pt declines hospice evaluation, recommend home based palliative care      This information was shared with: Primary team    I have spent 20 minutes (1510-1530) on face-to-face Advance Care Planning Services. 100% of the time was spent on discussion  and counseling the patient, daughter and caregiver. No active management of the problems listed above was undertaken during the time period reported.       Signed by:   Namon Cirri, MD  Palliative Medicine and Comprehensive Care  Spectra: [phone] M-F 9a-4:30p  Extend Pager: #[phone]   24/7    March 19, 2020 4:19 PM

## 2020-03-19 NOTE — Progress Notes (Signed)
ICU/CCU Daily Progress Note    Patient's Name: [name]    Room:  ZO109/UE454-09  Attending Provider: August Luz, MD  Admit Date:March 31, 2020  Medical Record Number: [id number]     Date/Time: 03/19/20 7:52 AM    74 year old female w/ hx afib, CHF, group 1 or 2 PAH, HCM, COPD, ILD, cardiac arrest after anesthesia presents with profound acute hypoxic respiratory failure after extubation following elective upgrade of single chamber pacemaker to dual chamber pacemaker.      24hr events:  NAEON  HNFC, nitric at 10, weaning  CPAP overnight  40 IV lasix for decreased UOP      VITAL SIGNS PHYSICAL EXAM   Temp:  [98.1 F (36.7 C)-99 F (37.2 C)] 99 F (37.2 C)  Heart Rate:  [69-71] 70  Resp Rate:  [21-37] 29  BP: (92-127)/(51-60) 127/60  FiO2:  [79 %-80 %] 80 %   Pulse ox: 88-100%  MAP 65-80    Blood Glucose: 105    Telemetry: Intermittent Afib overnight. NSR currently.    Vent Settings  Vent Mode: PS/CPAP in NIV  FiO2: 80 %  Resp Rate (Set): 0  Vt (Set, mL): 0 mL  PIP Observed (cm H2O): 11 cm H2O  PEEP/EPAP: 6 cm H20  Pressure Support / IPAP: 6 cmH20  Mean Airway Pressure: 8 cmH20    Intake/Output Summary (Last 24 hours) at 03/19/2020 2956  Last data filed at 03/19/2020 0700  Gross per 24 hour   Intake 1030 ml   Output 1876 ml   Net -846 ml    Physical Exam  General: not in acute distress  Neuro: A&O x 4  Cardiac: RRR, no m/r/g  Lungs: Coarse rhonchi throuhgout the lung fields most prominent in the RUL.  Abdomen: soft, NT, ND  Ext: warm and dry, no LE edema          Scheduled Meds: PRN Meds:    apixaban, 5 mg, Oral, Q12H SCH  famotidine, 20 mg, Oral, Daily  fluticasone, 1 spray, Each Nare, Daily  levothyroxine, 50 mcg, Oral, Daily at 0600  midodrine, 10 mg, Oral, TID MEALS  perflutren protein A microsph, 3 mL, Intravenous, Once  piperacillin-tazobactam, 4.5 g, Intravenous, Q8H  selexipag, 1,200 mcg, Oral, Q12H SCH  senna-docusate, 1 tablet, Oral, Q12H SCH  sodium bicarbonate, 650 mg, Oral,  QID        Continuous Infusions:   acetaminophen, 650 mg, Q8H PRN  benzocaine-menthol, 1 lozenge, Q1H PRN  calcium carbonate, 500 mg, 4X Daily PRN  lidocaine viscous, 5 mL, Q6H PRN  saline, 1 spray, PRN            Labs (last 72 hours):  Recent Labs     03/18/20  0342 03/17/20  0441   WBC 13.45* 13.19*   Hgb 10.3* 11.7   Hematocrit 31.3* 36.4   Plt 191     No results for input(s): PT, INR, PTT in the last 72 hours.     Lactic Acid 1.1    BNP 186 (103) 12/11    AST 29  ALT 22  ALP 146     Procal 0.94 Recent Labs     03/19/20  0447 03/18/20  0342 03/17/20  1506   Sodium 148* 143 143   Potassium 3.8 3.7 4.2   Chloride 112* 110 110   CO2 22 20* 17*   BUN 39.0* 41.0* 39.0*   Creatinine 1.4* 1.7* 2.0*   Glucose 100 105* 102*  Calcium 8.8 8.4 8.6   Magnesium 2.0 2.0 2.0   Phosphorus 3.9 4.5  --      UA  -LE moderate  -Nitrite negative  -Protein 100  -Blood moderate  -RBC 26-50  -WBC 26-50  -Hyaline casts TNTC    Repeat UA  Neg LE, nitrites, protein.   Small blood  Hyaline casts 6-10     ABG: no new    Microbiology:   12/11 Sputum stain/cx: mod mixed respiratory flora  12/11 MRSA nares/throat pending  12/11 Urine cx pending  12/11 Throat cx pending  12/11 Blood cx pending     Imaging:  No results found.   TTE 12/10    * Left ventricular systolic function is normal with an ejection fraction of  55 to 60%.    * Increased left ventricular wall thickness towards the apex,    * morphology suggestive of apical    * variant hypertrophic cardiomyopathy.    * Decreased right ventricular systolic function.    * The left atrium is moderately dilated.    * The right atrium is dilated.    * No evidence of interatrial shunt by color Doppler or agitated saline (with  and without provocative maneuver).    * There is severe tricuspid regurgitation.    * Severe pulmonary hypertension with estimated right ventricular systolic  pressure of  85 mmHg.    * The IVC is dilated with < 50% respiratory variance consistent with  significantly elevated  RA pressure of 15 mmHg.    * Small posterior pericardial effusion visualized.    * Compared to the prior study on 02/02/2020,  TR is worse.    CXR 12/13 new L lingular/lower lobe infiltrate      Assessment and Plan:  74 y.o. 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 Hospital course c/b hypotension c/f possible shock, sepsis, though no clear source. Hypotension could also be related to vasodilation d/t selexipag.     Neuro:   NAI    #Encephalopathy - resolved    Cardio:   #Shock  -Borderline hypotension w/evidence of end organ ischemia w/rising lactic acid and AKI.  -Etiology unclear, cardiogenic vs. distributive 2/2 sepsis; no evidence of hypovolemic/hemorrhagic shock.  -Infectious w/u as below  -Midodrine 10 mg TID for pressure support; may help with systemic vasodilation 2/2 PH meds    #HCM  #HFpEF (50%)  -Repeat echo shows LVEF 55-60% and severe TR worsened from previous echo in Oct 2021, severe PH w/RVSP 85 mmHg.  -Caution w/fluid balance in setting of restrictive physio from HCM  - IV lasix 40 mg x 1    #Hx Afib  -Underwent pacemaker upgrade 12/9.   -Eliquis 5 mg q12h    #PAH- on chart review either group 1 or 2  -See resp      Resp:   #Acute hypoxic respiratory failure  #PAH/PH Group 1 vs. 2  Unclear etiology, patient does have history of cardiac arrest with previous anesthesia. Possible this was secondary to anesthesia with increased afterload for r. Side. Pt previously with cardiac arrest periprocedurally, likely same etiology Doesn't appear volume overloaded, no consolidations or pneumothorax on CXR. Hx of ILD and amiodarone toxicity with lungs. Shunt? A-a O2 gradient 303.7 (expected 22.5) suggesting shunt ddx dead space (PNA, asthma/COPD/PE); L->r shunt (edema, ards,  pna) or alveolar  hypoventilation (pulmonary fibrosis, ILD).   -HFNC @ FiO2 80% and flow 40 L/min  -C/w CPAP at nighttime while sleeping  -Wean NO as tolerated  -Uptravi (selexipag) 1,600 mcg po q12h 12/10, reduced to 1,200 mcg po q12h in light of worsening hypotension  -Advanced lung following, appreciate recs    #Congestion  -Flonase BID   -Nasal saline prn    #Anterior tonsillar pillar avulsion  -Likely 2/2 intubation trauma  -New exudates seen on b/l adenoids  -ENT evaluated, recommend conservative mgmt      Renal /Fluid, Electrolytes:   #AKI on CKD - improving  -Cr baseline 1.1  -Cr rose from baseline to 1.7 overnight in setting of lasix admin  -Likely ATN 2/2 cardiorenal syndrome and shock vs. developing prerenal component BUN:Cr rising now > 20  -Management of underlying cause(s) as above  -Avoid nephrotoxic agents    #AGMA - resolved  #MA - resolved  -Na-HCO3 1,300 mg po QID, decrease to 650 mg QID  #Hyperkalemia - resolved    GI:   #Nutrition  -Passed bedside swallow study  -Clear liquid diet (pt preference)     #PUD ppx  #GERD  -Takes protonix 40 mg po qD at home  -Pepcid 20 mg po qD     #Bowel regimen  -Pericolace 8.6-50 mg q12h       Infectious Disease (ID):   #Shock  -Possible sepsis, no clear source  -F/u blood cx NGTD x1D  - MRSA nares/throat negative  -F/u throat cx   -Sputum cx mixed upper respiratory flora, urine cx negative  -CXR this AM not suggestive of pna but at risk for aspiration pna vs. Pneumonitis  -ENT eval as above   -Vanc IV per pharmacy (12/11 - 12/12 ), [place] given negative MRSA swab  -Zosyn 4.5 mg IV q8h (12/11 - )        Hem/Onc:   -Eliquis for VTE ppx in setting of Afib as above      Endo:   #Hypothyroidism  -Synthroid 50 mcg qD       Prophylaxis:  - DVT: Home eliquis  - GI:  Pepcid in place of home protonix    Code Status: DNR/DNI  Dispo: Wean nitric oxide, ok to be stepped down to [facility] with HFNC    Lines/Drains/Airways:  -PIV x 2  -Foley       Signed by: Donzetta Sprung, MD, MD  Date/Time: 03/19/20 7:26  AM
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
## 2020-03-19 NOTE — Plan of Care (Signed)
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  Pf note, pt is a Jehovah's witness and declines blood transfusions.    Neuro:  - pt wishes to be DNR/DNI  - Palliative following    CV:  - decreased home selexipag, given hypotension  - weaning iNO gradually: 20 --> 15 ppm --> plan to wean to OFF if no worsening of respiratory status / hemodynamics  - once off iNO, pt can be downgraded to APU  - BP not that far from baseline (~100/60)  - LA has cleared w/ backing off diuresis, starting midodrine and antibiotics  - midodrine 10 TID (relatively preserved LV function)  - very low cardiac output at baseline, hence upgrade from single-chamber pacemaker to dual-chamber pacemaker (in hopes synchronization would augment her CO)  - TTE w/ bubble: LVEF 55-60%, apical variant HCM, grade III diastolic dysfunction, no evidence of interatrial shunt, severe PH RVSP 85, severe TR, TAPSE 0.70, small posterior pericardial effusion  - Echo w/ bubble 09/08/17 found no evidence of right to left shunt, though AF ablation procedure in 2017 included transseptal puncture  - cautious w/ diuresis  considering HCM    Pulm:  - CXR w/ prominent vasculature and new opacities  - spot diuresis as tolerated  - started abx 12/11, given productive sputum  - albuterol BID for airway clearance  - IPV vs Acapella for airway clearance  - CPAP QHS  - previously: HRCT without ILD or pulmonary fibrosis; dual energy CTA negative for clots  - Advanced Lung following    ID: NAI  - now w/ productive cough; pt may have aspirated during anesthesia w/ LMA  - s/p Zosyn 12/11-12/13  - meropenem 12/13-  - pan-culture, including Legionella Ag and RVP  - MRSA nares negative  - UA+ but UCx negative  - also w/ sore throat and erythema / pus on right posterior pharynx, perhaps secondary to trauma w/ intubation  - ENT performed rhinolaryngoscopy: anterior tonsillar pillar avulsion likely secondary to intubation  - throat culture: "no beta hemolytic Streptococcus group A, C, or G isolated, no further work"    Renal/Fluid, Electrolytes:  - NAGMA, unclear etiology  - bicarb tabs  - worsening AKI and hypotension 12/11: held diuresis  - Lasix 40 daily since 12/12 given improved hemodynamics and Cr  - s/p kayexalate x 1 for hyper-K+  - developing hypernatremia likely secondary to minimal PO secondary to sore throat, not overdiuresis    Heme:  - c/w home Eliquis (AF)  - pt is a Jehovah's witness and declines blood transfusions    GI/Nurition:    Endo:  - c/w home levoT4    Lines/Tubes:   - none    PPx:   -DVT: Eliquis   -GI: famotidine  Code status:  DNR / DNI

## 2020-03-19 NOTE — Plan of Care (Signed)
Assumed care of patient at 1900.    N: A&Ox4. T max 99.0.     CV: SR 70-80's. SBP 100-110'd.     GI: Large loose BM overnight. Bowel regimen held.    GU: 10-30cc/hr. Lasix 40 IV given, and urine output improved to 50-75 ml/hr.    Problem: Non-Violent Restraints Interdisciplinary Plan  Goal: Will be injury free during the use of non-violent restraints  Outcome: Progressing     Problem: Compromised Tissue integrity  Goal: Damaged tissue is healing and protected  Outcome: Progressing  Goal: Nutritional status is improving  Outcome: Progressing     Problem: Moderate/High Fall Risk Score >5  Goal: Patient will remain free of falls  Outcome: Progressing     Problem: Safety  Goal: Patient will be free from injury during hospitalization  Outcome: Progressing  Goal: Patient will be free from infection during hospitalization  Outcome: Progressing     Problem: Pain  Goal: Pain at adequate level as identified by patient  Outcome: Progressing     Problem: Side Effects from Pain Analgesia  Goal: Patient will experience minimal side effects of analgesic therapy  Outcome: Progressing     Problem: Discharge Barriers  Goal: Patient will be discharged home or other facility with appropriate resources  Outcome: Progressing     Problem: Psychosocial and Spiritual Needs  Goal: Demonstrates ability to cope with hospitalization/illness  Outcome: Progressing
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
## 2020-03-19 NOTE — Plan of Care (Signed)
ICU Checklist      Ventilator Bundle    Patient on ventilator: No        Line Bundle  Central line present: No    Date inserted: n/a  Type: n/a  Location: n/a  Can central line be removed?: Not Applicable   PIV x 3    Foley Bundle  Foley catheter present?: No    Date placed:  Can urinary catheter be removed: Not Applicable    General Care Bundle  Restraints Renewed?: Yes  Fluid balance achieved?: No  DVT Prophylaxis ordered?: Yes  If no, choose one of the following:   [] ? Active Bleed     [] ? Coagulopathy   [] ? Thrombocytopenia  [] ? Other:   Nutrition ordered?: yes   If no, choose one of the following:  [] ? NPO    [] ? Aspiration risk   [] ? Ileus   [] ? Hemodynamically instable  [] ? Other:   Bowel movement in last 48 hours?: Yes  If no, bowel regimen in place?: Yes  PUD prophylaxis: Yes  Mobility protocol: PT/OT ordered? Yes

## 2020-03-20 ENCOUNTER — Inpatient Hospital Stay: Payer: Medicare Other

## 2020-03-20 ENCOUNTER — Encounter: Payer: Self-pay | Admitting: Cardiovascular Disease

## 2020-03-20 DIAGNOSIS — E872 Acidosis: Secondary | ICD-10-CM

## 2020-03-20 DIAGNOSIS — R579 Shock, unspecified: Secondary | ICD-10-CM

## 2020-03-20 DIAGNOSIS — I270 Primary pulmonary hypertension: Secondary | ICD-10-CM | POA: Diagnosis not present

## 2020-03-20 DIAGNOSIS — J9601 Acute respiratory failure with hypoxia: Secondary | ICD-10-CM | POA: Diagnosis not present

## 2020-03-20 DIAGNOSIS — R0600 Dyspnea, unspecified: Secondary | ICD-10-CM | POA: Diagnosis not present

## 2020-03-20 DIAGNOSIS — Z45010 Encounter for checking and testing of cardiac pacemaker pulse generator [battery]: Secondary | ICD-10-CM | POA: Diagnosis not present

## 2020-03-20 LAB — CBC AND DIFFERENTIAL
Absolute NRBC: 0.2 10*3/uL — ABNORMAL HIGH (ref 0.00–0.00)
Basophils Absolute Automated: 0.04 10*3/uL (ref 0.00–0.08)
Basophils Automated: 0.3 %
Eosinophils Absolute Automated: 0.18 10*3/uL (ref 0.00–0.44)
Eosinophils Automated: 1.6 %
Hematocrit: 30.7 % — ABNORMAL LOW (ref 34.7–43.7)
Hgb: 9.7 g/dL — ABNORMAL LOW (ref 11.4–14.8)
Immature Granulocytes Absolute: 0.08 10*3/uL — ABNORMAL HIGH (ref 0.00–0.07)
Immature Granulocytes: 0.7 %
Lymphocytes Absolute Automated: 0.57 10*3/uL (ref 0.42–3.22)
Lymphocytes Automated: 5 %
MCH: 32 pg (ref 25.1–33.5)
MCHC: 31.6 g/dL (ref 31.5–35.8)
MCV: 101.3 fL — ABNORMAL HIGH (ref 78.0–96.0)
MPV: 12 fL (ref 8.9–12.5)
Monocytes Absolute Automated: 0.95 10*3/uL — ABNORMAL HIGH (ref 0.21–0.85)
Monocytes: 8.3 %
Neutrophils Absolute: 9.67 10*3/uL — ABNORMAL HIGH (ref 1.10–6.33)
Neutrophils: 84.1 %
Nucleated RBC: 1.7 /100 WBC — ABNORMAL HIGH (ref 0.0–0.0)
Platelets: 210 10*3/uL (ref 142–346)
RBC: 3.03 10*6/uL — ABNORMAL LOW (ref 3.90–5.10)
RDW: 19 % — ABNORMAL HIGH (ref 11–15)
WBC: 11.49 10*3/uL — ABNORMAL HIGH (ref 3.10–9.50)

## 2020-03-20 LAB — HEPATIC FUNCTION PANEL
ALT: 22 U/L (ref 0–55)
AST (SGOT): 24 U/L (ref 5–34)
Albumin/Globulin Ratio: 0.8 — ABNORMAL LOW (ref 0.9–2.2)
Albumin: 2.7 g/dL — ABNORMAL LOW (ref 3.5–5.0)
Alkaline Phosphatase: 114 U/L — ABNORMAL HIGH (ref 37–106)
Bilirubin Direct: 0.4 mg/dL (ref 0.0–0.5)
Bilirubin Indirect: 0.7 mg/dL (ref 0.2–1.0)
Bilirubin, Total: 1.1 mg/dL (ref 0.2–1.2)
Globulin: 3.6 g/dL (ref 2.0–3.6)
Protein, Total: 6.3 g/dL (ref 6.0–8.3)

## 2020-03-20 LAB — BASIC METABOLIC PANEL
Anion Gap: 17 — ABNORMAL HIGH (ref 5.0–15.0)
BUN: 34 mg/dL — ABNORMAL HIGH (ref 7.0–19.0)
CO2: 21 mEq/L — ABNORMAL LOW (ref 22–29)
Calcium: 8.8 mg/dL (ref 7.9–10.2)
Chloride: 110 mEq/L (ref 100–111)
Creatinine: 1.4 mg/dL — ABNORMAL HIGH (ref 0.6–1.0)
Glucose: 123 mg/dL — ABNORMAL HIGH (ref 70–100)
Potassium: 3.9 mEq/L (ref 3.5–5.1)
Sodium: 148 mEq/L — ABNORMAL HIGH (ref 136–145)

## 2020-03-20 LAB — LACTIC ACID, PLASMA
Lactic Acid: 2.5 mmol/L — ABNORMAL HIGH (ref 0.2–2.0)
Lactic Acid: 3.3 mmol/L — ABNORMAL HIGH (ref 0.2–2.0)
Lactic Acid: 2.1 mmol/L — ABNORMAL HIGH (ref 0.2–2.0)

## 2020-03-20 LAB — PHOSPHORUS: Phosphorus: 3.9 mg/dL (ref 2.3–4.7)

## 2020-03-20 LAB — MAGNESIUM: Magnesium: 2 mg/dL (ref 1.6–2.6)

## 2020-03-20 LAB — GFR: EGFR: 44.4

## 2020-03-20 MED ORDER — ALBUTEROL-IPRATROPIUM 2.5-0.5 (3) MG/3ML IN SOLN
3.0000 mL | Freq: Four times a day (QID) | RESPIRATORY_TRACT | Status: DC | PRN
Start: 2020-03-20 — End: 2020-03-21

## 2020-03-20 MED ORDER — FUROSEMIDE 10 MG/ML IJ SOLN
40.0000 mg | Freq: Once | INTRAMUSCULAR | Status: AC
Start: 2020-03-20 — End: 2020-03-20
  Administered 2020-03-20: 03:00:00 40 mg via INTRAVENOUS
  Filled 2020-03-20: qty 4

## 2020-03-20 MED ORDER — FUROSEMIDE 10 MG/ML IJ SOLN
40.0000 mg | Freq: Once | INTRAMUSCULAR | Status: AC
Start: 2020-03-20 — End: 2020-03-20
  Administered 2020-03-20: 15:00:00 40 mg via INTRAVENOUS
  Filled 2020-03-20: qty 4

## 2020-03-20 NOTE — PT Progress Note (Signed)
Physical Therapy Note    [facility]   Physical Therapy Treatment  Patient:  [name] MRN#:  [id number]  Unit: [facility] CICU  Bed: FI102/FI102-01      Post Acute Care Therapy Recommendations:   Discharge Recommendations: SNF     Milestones to be reached to achieve recommendation: none    DME Recommended for Discharge: Patient already has needed equipment    If SNF recommended discharge disposition is not available, patient will need hands on assist for mobility and ADL, stretcher transport into home, increased assist from HHA, and HHPT.     Therapy discharge recommendations may change with patient status.  Please refer to most recent note for up-to-date recommendations.Thank you.    Assessment:   Significant Findings: SpO2 dropped to 79-83% following bed > chair transfer (poor waveform reading) and BP dropped to 86/50 during transfer. See vitals section below in chart of further details. RN made aware.     Pt received seated at EOB. Nurse and pt agreeable to skilled PT. Pt completed sit <> stand with mod A. She required modA/min A for household transfer; min A while ambulating 69ft during transfer using no AD. Pt demonstrating improved posture and balance this PT session. Further standing and ambulation limited by SOB and drop in SpO2 (see vitals section below in chart of further details). At end of PT session, pt seated in bedside chair and reviewed seated TherEx with pt. She required prolonged seated rest between tasks/activities 2/2 SOB. Pt cued on PLB throughout PT session and educated her on energy conservation techniques. Pt would continue to benefit from skilled PT to maximize functional mobility and independence.     Assessment: Decreased UE strength,Decreased LE strength,Decreased safety/judgement during functional mobility,Decreased endurance/activity tolerance,Decreased functional mobility,Decreased balance,Gait impairment  Progress: Slow progress,  decreased activity tolerance  Prognosis: Good,With continued PT status post acute discharge  Risks/Benefits/POC Discussed with Pt/Family: With patient  Patient left without needs and call bell within reach. RN notified of session outcome.     Treatment Activities: TherEx, TherAct, endurance training, gait training.    Educated the patient to role of physical therapy, plan of care, goals of therapy and HEP, safety with mobility and ADLs, energy conservation techniques, pursed lip breathing.      Plan:   Treatment/Interventions: Exercise,Gait training,Stair training,Functional transfer training,LE strengthening/ROM,Endurance training,Patient/family training,Equipment eval/education,Bed mobility        PT Frequency: 2-3x/wk     Continue plan of care.       Precautions and Contraindications:   Precautions  Other Precautions: falls, HFNC, monitor O2    Updated Medical Status/Imaging/Labs:  PT reviewed previous imaging prior to session.     Lab Results   Component Value Date/Time    HGB 9.7 (L) 03/20/2020 03:47 AM    HCT 30.7 (L) 03/20/2020 03:47 AM    K 3.9 03/20/2020 03:47 AM    NA 148 (H) 03/20/2020 03:47 AM     Subjective:   "I'm a little dizzy."    Patient Goal: to get better    Pain Assessment  Pain Assessment: No/denies pain    Patient's medical condition is appropriate for Physical Therapy intervention at this time.  Patient is agreeable to participation in the therapy session. Nursing clears patient for therapy.    Objective:   Observation of Patient/Vital Signs:  Patient is seated in a bedside chair and seated at edge of bed with telemetry, peripheral IV, High flow nasal cannula and catheter  in place.  Pt wore mask during therapy session: no    Vital Signs:   BP prior to PT session, seated at EOB: 123/57  BP following transfers to chair, taken seated: 86/50 + symptomatic. RN made aware  BP after 3 minutes seated in chair: 102/56  SpO2 at start of PT session, seated at EOB: 89-92%  SpO2 following transfer to the  chair: dropped to 79-84% (poor waveform on monitor) - recovered to 90% within 2-3 minute seated rest break + PLB. RN made aware.    Cognition/Neuro Status  Arousal/Alertness: Appropriate responses to stimuli  Attention Span: Appears intact  Memory: Appears intact  Following Commands: Follows all commands and directions without difficulty  Safety Awareness: minimal verbal instruction  Insights: Educated in Engineer, building services  Problem Solving: Assistance required to identify errors made  Orientation Level: Oriented X4    Functional Mobility:  Sit to Stand: Moderate Assist;with instruction for hand placement to increase safety  Stand to Sit: Minimal Assist  Transfers  Bed to Chair: Moderate Assist;Minimal Assist    Ambulation:  PMP - Progressive Mobility Protocol   PMP Activity: Step 5 - Chair;Step 6 - Walks in Room  Distance Walked (ft) (Step 6,7): 2 Feet    Ambulation: Minimal Assist (37ft during transfer)  Pattern: decreased cadence;decreased step length;R foot decreased clearance;L foot decreased clearance  Therapeutic Exercise  Glute Sets: seated x5 reps  Hip Flexion: seated marches x10 reps  Hip Abduction: seated x10 reps  Knee AROM : seated LAQ x10 reps  Ankle Pumps: seated x10 reps   Neuro Re-Ed  Sitting Balance: supervision  Standing Balance: minimal assist       Patient Participation: good  Patient Endurance: fair    Patient left with call bell within reach, all needs met, SCDs off, fall mat up, bed alarm n/a, chair alarm on and all questions answered. RN notified of session outcome and patient response.     Goals:  Goals  Goal Formulation: With patient  Time for Goal Acheivement: 7 visits  Goals: Select goal  Pt Will Go Supine To Sit: with supervision  Pt Will Perform Sit to Stand: with supervision  Pt Will Transfer Bed/Chair: with supervision  Pt Will Ambulate: 51-100 feet,with supervision (using LRAD)  Pt Will Go Up / Down Stairs: 3-5 stairs,with stand by assist,With rail      Time of Treatment  PT Received  On: 03/20/20  Start Time: 1528  Stop Time: 1606  Time Calculation (min): 38 min  Treatment # 1 out of 7 visits    PPE worn during session: procedural mask, goggles and gloves  Tech present: no  PPE worn by tech: N/A    Terie Purser  DPT, PT  [phone]

## 2020-03-20 NOTE — Progress Notes (Signed)
Patient's Name: [name]   Admit Date: 04/02/2020  Medical Record Number: [id number]   Code Status: No CPR with SUP  Room: FI102/FI102-01  Date/Time: 03/20/20 12:01 PM  Cardiologist of record: IMG    24 Hour Events:    - Provided Lasix 40 mg IV x2.  - Decreased nitric oxide.  - Remains on HFNC with decrease in FiO2 to 60 from 80%. Had on desaturation to 71%.    IMPRESSIONS:      Presented for electivechange of single-chamber pacemaker to dual-chamber pacemaker requiring intubation for the procedure.   Post extubation developed hypoxic respiratory failure and was reintubated- continued to be hypoxic even on 100% FiO2, improved with Veletri. Mostly related to hx of severe PH.    Brief hypotension requiring pressor support, now off pressors.   Severe pulmonary hypertension (Group 2 +/- 1)   RHC (06/26/16): RA 4, PA 69/23/38, PCWP 8, TPG 30, Fick CO 4.15 and PVR 7.3, thermal CO 3.14 and PVR 9.6.   Treated with macitentan previously.She did not tolerate tadalafil, selexipag was subsequently added.A trial of riociguat was stopped due to hypotension.    RHC 01/24/20: RA 10, RV 79/5, PA 83/38/53, PCWP 14.    With exercise her PCWP increased to 21 mm hg. She has low output state predominantly driven by her severe pulmonary hypertension   Apical hypertrophic cardiomyopathy with apical aneurysm   TTE (12/10): LVEF 55-60%, hypertrophic cardiomypathy, LA moderately dilated, no evidence of shunt, severe TR, severe PH RVSP 85 mmHg, grade III diastolic dysfunction.   pAF s/p prior CTI ablation and PV isolation. 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    Hx of GI bleeding   Hypothyroidism    Jehovas witness   Sleep Apnea    RECOMMENDATIONS:      Lasix 40 mg IV PRN- gentle diuresis with aim to keep net  even/slightly neg     Continue Midodrine 10mg  PO TID today - goal MAP >14mmHg   Continue Eliquis 5mg  PO BID for paroxysmal AF   Continue home Uptravi   Advanced lung consulted, appreciate recommendations   Rest of care per [facility] team    Discussed with team, [facility], RN, patient    Signed: Viann Shove, MD  PGY-4    Date/Time: 03/20/20 12:01 PM  ---------------------------------------------------------------------------  CICU Cardiology Attending addendum:    Patient seen/examined with cardiology fellow Dr. Dalene Best.   Exam notable for RRR, normal S1-S2, clear lungs.   24 hour events: Inhaled nitric oxide weaned from 10 to 5, high flow oxygen weaned from 80% to 60%, lactic acid slightly increased from 1.7 at 8:50 PM, to 2.5 at 3:45 AM, creatinine stable at 1.4  My assessment/plan as noted above with the following additions:    Impressions:  74 year old female with apical hypertrophic cardiomyopathy with apical aneurysm, severe group 2 pulmonary hypertension, possible ILD, PAF s/p prior ablation/PVI isolation, prior GI bleed, Jehovah's Witness, OSA, admitted to the CICU for hypoxic respiratory failure requiring intubation in the setting of a dual-chamber pacemaker upgrade. CICU course notable for subsequent extubation.    Recommendations:   Continue midodrine   Infectious work-up pending   Continued goals of care discussion. Patient has elected to be DNR/DNI   Diuresis as needed, will give lasix 40mg  IV this  morning   Continue selexipag   Continue apixaban for PAF    Patient independently examined by me. Pertinent medical records, studies, labs, imaging studies independently reviewed by me. Treatment of an organ system (cardiac) with high probability of life-threatening deterioration and need for life-saving interventions. Critical care time spent excluding procedures/teaching 30 minutes from 10:30AM to 11AM.     Dr. Gwynneth Best. Diane Shearer, MD [facility]  CICU Cardiology Attending  Spectralink  [phone]  ----------------------------    History: [name] is a 74 y.o. female. She has a past medical history of pAFib (On AC) s/p prior CTI ablation/PV isolation and AV junction ablation/implantation of VVI pacemaker, heart failure with preserved ejection fraction due to Apical hypertrophic cardiomyopathy with apical aneurysm, CKD, COPD, COVID-19 (06/2019), disorder of thyroid, gastrointestinal hemorrhage, severe pulmonary hypertension (On oxygen by nasal cannula), OSA on CPAP (CPAP since 2015).    She presented on 08-Apr-2020 for elective change of single-chamber pacemaker to dual-chamber pacemaker. Per anesthesia reports, patient required intubation under LMA sedation. After extubation she developed hypoxemia and respiratory distress and had to be reintubated.[facility] consulted admitted to CICU for post procedure respiratory failure and hypoxemia and management.    On arrival to the CICU she was very hypoxemic with saturation down to 70s on 100% FiO2 and PEEP of 8. Stat chest x-ray did not show pneumothorax.ET tube in appropriate position. Stat sonographic examination of heart did not show any pericardial tamponade. LV function at baseline.    Objective:    Physical Exam:    Vital signs in last 24 hours:     Heart Rate:  [69-70] 69  Resp Rate:  [18-38] 21  BP: (89-136)/(53-76) 128/60  FiO2:  [59 %-100 %] 100 %  SpO2: 94 %   O2 Device: HFNC  Height: 161.3 cm (5' 3.5")    Intake/Output:    Intake/Output Summary (Last 24 hours) at 03/20/2020 1201  Last data filed at 03/20/2020 1100  Gross per 24 hour   Intake 400 ml   Output 1720 ml   Net -1320 ml     Net IO Since Admission: -3,917.46 mL [03/20/20 1201]    Weight:     Wt Readings from Last 4 Encounters:   03/18/20 85.1 kg (187 lb 9.8 oz)   03/07/20 82.1 kg (181 lb)   03/02/20 82.8 kg (182 lb 9.6 oz)   02/22/20 82.6 kg (182 lb)     Vitals:    03/20/20 1100   BP: 128/60   Pulse: 69   Resp: 21   Temp:    SpO2: 94%     General: HFNC, yet awake  Neck:  Trachea midline, JVD   Pulmonary: Normal respiratory effort, clear lung sounds bilaterally.  Cardiovascular: PMI nondisplaced with normal rate and rhythm, normal S1, normal S2, no murmur, rub, or gallop appreciated.  Abdomen: Soft, nontender.  Extremities: No edema, no clubbing, no cyanosis.    Laboratory:      Basic Metabolic Panel:         03/20/20  0347 03/19/20  0447 03/18/20  0342   Creatinine 1.4* 1.4* 1.7*   BUN 34.0* 39.0* 41.0*   CO2 21* 22 20*   Sodium 148* 148* 143   Potassium 3.9 3.8 3.7   Magnesium 2.0 2.0 2.0   Phosphorus 3.9 3.9 4.5   Chloride 110 112* 110   Calcium 8.8 8.8 8.4     Liver Function Test:        03/20/20  0347 03/16/20  0303  03/08/2020  2031   AST (SGOT) 24 29 26    ALT 22 22 25    Bilirubin, Total 1.1 0.6 0.5     Complete Blood Count:        03/20/20  0347 03/18/20  0342 03/17/20  0441   WBC 11.49* 13.45* 13.19*   Hgb 9.7* 10.3* 11.7   Hematocrit 30.7* 31.3* 36.4   Platelets 210 191 231     Cardiac Markers:        03/16/20  1806   B-Natriuretic Peptide 186*     Imaging:   Radiology Results (24 Hour)     Procedure Component Value Units Date/Time    XR Chest AP Portable [[id number]] Collected: 03/20/20 0746    Order Status: Completed Updated: 03/20/20 0750    Narrative:      History: Heart disease with edema. Exam compared prior study 03/19/2020.    FINDINGS:  Portable AP view. 4098.  The heart remains enlarged with left pacemaker. There is pulmonary  vascular congestion and interstitial edema, slightly decreasing. There  is also minimally decreasing basilar atelectasis especially on the left.  There is no pneumothorax.      Impression:       Slightly decreasing vascular congestion and interstitial  edema. Remainder as above.    Wilmon Pali, MD   03/20/2020 7:47 AM        Cardiographics:     Echocardiogram (03/16/2020):       * Left ventricular systolic function is normal with an ejection fraction of 55 to 60%.    * Increased left ventricular wall thickness towards the apex,morphology  suggestive of apical variant hypertrophic cardiomyopathy.    * Decreased right ventricular systolic function.    * The left atrium is moderately dilated.    * The right atrium is dilated.    * No evidence of interatrial shunt by color Doppler or agitated saline (with and without provocative maneuver).    * There is severe tricuspid regurgitation.    * Severe pulmonary hypertension with estimated right ventricular systolic pressure of  85 mmHg.    * The IVC is dilated with < 50% respiratory variance consistent with significantly elevated RA pressure of 15 mmHg.    * Small posterior pericardial effusion visualized.    * Compared to the prior study on 02/02/2020,  TR is worse.    Inpatient Medications:    Scheduled:      albuterol, 2.5 mg, Nebulization, BID    apixaban, 5 mg, Oral, Q12H SCH    famotidine, 20 mg, Oral, Daily    fluticasone, 1 spray, Each Nare, Daily    levothyroxine, 50 mcg, Oral, Daily at 0600    midodrine, 10 mg, Oral, TID MEALS    piperacillin-tazobactam, 4.5 g, Intravenous, Q8H    selexipag, 1,200 mcg, Oral, Q12H SCH    senna-docusate, 1 tablet, Oral, Q12H SCH    sodium bicarbonate, 650 mg, Oral, QID    Infusions:      Home Medications     Med List Status: Complete Set By: Tania Ade, RN at 03/14/2020  1:48 PM                allopurinol (ZYLOPRIM) 100 MG tablet     Take 1 tablet (100 mg total) by mouth daily Per pt taking 50mg  (half tab)     Notes:  Take morning of procedure per MD     apixaban (ELIQUIS) 5 MG     Take 1 tablet (5 mg total)  by mouth every 12 (twelve) hours     Notes:  Last dosage 03/11/2020 p.m per MD     atorvastatin (LIPITOR) 80 MG tablet     Take 80 mg by mouth every evening        colchicine 0.6 MG tablet     Take 1 tablet (0.6 mg total) by mouth daily     Patient taking differently: Take 0.6 mg by mouth every morning        Notes:  Take morning of procedure per MD     ezetimibe (ZETIA) 10 MG tablet     Take 10 mg by mouth nightly        ferrous sulfate 325 (65 FE) MG  tablet     Take 325 mg by mouth Once every Monday, Wednesday and Friday morning Taking on Monday wed, friday / severe GI bleed 11/2019       Notes:  Hold morning of procedure per guidelines         furosemide (LASIX) 40 MG tablet     Take 1 tablet (40 mg total) by mouth 2 (two) times daily     Patient taking differently: Take 40 mg by mouth every morning        Notes:  Hold morning of procedure per MD       levothyroxine (SYNTHROID) 50 MCG tablet     Take 50 mcg by mouth Once a day at 6:00am     Notes:  Take morning of procedure per MD     Multiple Vitamin (multivitamin) capsule     Take 1 capsule by mouth daily     Notes:  Hold 7 days prior to procedure per guidlines     OXYGEN-HELIUM IN     Inhale 4-6 L/min into the lungs        pantoprazole (PROTONIX) 40 MG tablet     Take 40 mg by mouth nightly        potassium chloride (K-DUR) 10 MEQ tablet     Take 10 mEq by mouth daily        Notes:  Hold morning of procedure per guidelines     Selexipag (Uptravi) 1600 MCG Tab     Take 1,600 mcg by mouth 2 (two) times daily     Patient taking differently: Take 1,600 mcg by mouth 2 (two) times daily        Notes:  Take morning of procedure per MD     spironolactone (ALDACTONE) 25 MG tablet     Take 1 tablet (25 mg total) by mouth 2 (two) times daily     Patient taking differently: Take 25 mg by mouth daily        Notes:  Hold morning of procedure per MD     vitamin D (CHOLECALCIFEROL) 25 MCG (1000 UT) tablet     Take 2,000 Units by mouth daily     Notes:  Hold 7 days prior to procedure per guidlines

## 2020-03-20 NOTE — Progress Notes (Signed)
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          FC I  -1 III  +1 IV  +2 +1          VS SBP<146mmHg  +1  HR>96bpm  +1 +1          All Cause hospitalization  within 6 months  +1  +1          ?418m  -2 320 to <442m  -1 <140m  +1 +1            BNP <50 or  NTproBNP<300  -2 200 to <800    +1 ?800 or  NT-proBNP?1100  +2 ?          ECHO  Effusion  +1  0          PFTS  DLCO<40%  +1  +1          RHC  RAP >3mmHg  +1 w/n 1 year PVR<5WU  -2 0             Sum of above 6       +6      Risk score 12 (incomplete)      Score Risk 12 month mortality   4-5    6 Very low risk    Low risk   <2.6%   7-8 Intermediate risk 6.2-7%   ?9 High risk >10.7%     Signed by:  Ethelene Hal, MD  [facility]

## 2020-03-20 NOTE — Plan of Care (Signed)
ICU Checklist        Ventilator Bundle     Patient on ventilator: No          Line Bundle  Central line present: No    Date inserted: n/a  Type: n/a  Location: n/a  Can central line be removed?: Not Applicable   PIV x 3     Foley Bundle  Foley catheter present?: No    Date placed:  Can urinary catheter be removed: Not Applicable     General Care Bundle  Restraints Renewed?: Yes  Fluid balance achieved?: No  DVT Prophylaxis ordered?: Yes  If no, choose one of the following:   []? Active Bleed     []? Coagulopathy   []? Thrombocytopenia  []? Other:   Nutrition ordered?: yes   If no, choose one of the following:  []? NPO    []? Aspiration risk   []? Ileus   []? Hemodynamically instable  []? Other:   Bowel movement in last 48 hours?: Yes  If no, bowel regimen in place?: Yes  PUD prophylaxis: Yes  Mobility protocol: PT/OT ordered? Yes         

## 2020-03-20 NOTE — Plan of Care (Signed)
Assumed care of patient at 1900.    N: A&Ox4. Afebrile    CV: SR 60-80's. SBP 100-120's.     R: BIPAP overnight. Nitric weaned from 10 to 5. FIO2 on HFNC weaned from 80% to 60%.    GI: 1 large loose BM. No N/V overnight.    GU: urine output averaging 20-50cc/hr. Lasix 40mg x1 given to improve urine output. Urine output did slightly improve with diuretic.      Problem: Non-Violent Restraints Interdisciplinary Plan  Goal: Will be injury free during the use of non-violent restraints  Outcome: Progressing     Problem: Compromised Tissue integrity  Goal: Damaged tissue is healing and protected  Outcome: Progressing  Goal: Nutritional status is improving  Outcome: Progressing     Problem: Moderate/High Fall Risk Score >5  Goal: Patient will remain free of falls  Outcome: Progressing     Problem: Safety  Goal: Patient will be free from injury during hospitalization  Outcome: Progressing  Goal: Patient will be free from infection during hospitalization  Outcome: Progressing     Problem: Pain  Goal: Pain at adequate level as identified by patient  Outcome: Progressing     Problem: Side Effects from Pain Analgesia  Goal: Patient will experience minimal side effects of analgesic therapy  Outcome: Progressing     Problem: Discharge Barriers  Goal: Patient will be discharged home or other facility with appropriate resources  Outcome: Progressing     Problem: Psychosocial and Spiritual Needs  Goal: Demonstrates ability to cope with hospitalization/illness  Outcome: Progressing

## 2020-03-20 NOTE — OT Plan of Care Note (Signed)
[facility]   Occupational Therapy Cancellation Note      Patient:  [name] MRN#:  [id number]  Unit:  [facility] CICU Room/Bed:  FI102/FI102-01    03/20/2020  Time: 1400      Patient not seen for occupational therapy secondary to pt declines therapy this AM 2/2 fatigue after using commode. Pt declines therapy this PM 2/2 having visitors. Will re-attempt OT Treatment as able and as time permits.     Randa Lynn, OTR/L  Pager [phone]

## 2020-03-20 NOTE — Progress Notes (Addendum)
S:Acute respiratory failure, Hypoxemia.  Severe pulmonary hypertension.  [name] witness      Provided Lasix 40 mg IV x2.  Decreased nitric oxide.  Remains on HFNC with decrease in FiO2 to 60 from 80%- desaturation to 71%.    B  Pt lives alone in a 75 yo + community.  Pt has a daughter and niece who will be providing support in addition to the private hired aide upon [employer].Pt has neighbors who also help out and check on her.    Pt was using Home O2 at 5L NC RTC at baseline.  Pt was independent with most of her ADLs,the aide helps out d/t her SOB on exertion.  Pt's insurance and PCP were verified.    A:  No CPR  No intubation  Estimated Prognosis: Weeks to months  In place: Physician Orders for Scope of Treatment (POST), Advance Directive/Living Will and Power of Attorney for Health Care    R:  Pt's care giver: Marcella Dubs [phone] can provide support and transportation upon discharge.    Plan: On going Palliative team visit. Pt declined hospice and open for out pt  Palliative.  Possible new O2 requirement - O2 provider is [facility].        [name] RN BSN  Case Manager   [facility]   [facility]   [address]   [place], [place] [address]   T [phone]

## 2020-03-20 NOTE — Progress Notes (Addendum)
ICU/CCU Daily Progress Note    Patient's Name: [name]    Room:  UE454/UJ811-91  Attending Provider: August Luz, MD  Admit Date:03-17-20  Medical Record Number: [id number]     Date/Time: 03/20/20 1:57 PM    74 year old female w/ hx afib, CHF, group 1 or 2 PAH, HCM, COPD, ILD, cardiac arrest after anesthesia presents with profound acute hypoxic respiratory failure after extubation following elective upgrade of single chamber pacemaker to dual chamber pacemaker.      24hr events:  Planned to [place] NO completely last night. Delayed to this AM due to rise in lactic acid and c/f inadequate diuresis. Pt got 40 mg IV lasix.    Pt seen at bedside. She endorses some increased SOB/WOB since NO was turned off. Otherwise continues to have some dried blood in her nasal passages. Also endorses lightheadedness and dizziness worse with movement/sitting up.        VITAL SIGNS PHYSICAL EXAM   Heart Rate:  [69-70] 69  Resp Rate:  [18-38] 21  BP: (89-136)/(53-76) 128/60  FiO2:  [59 %-100 %] 100 %   Pulse ox: 85-100%  MAP 74-86    Blood Glucose: 123    Telemetry: NSR    Vent Settings  Vent Mode: PS/CPAP in NIV  FiO2: 100 %  Resp Rate (Set): 0  Vt (Set, mL): 0 mL  PIP Observed (cm H2O): 11 cm H2O  PEEP/EPAP: 6 cm H20  Pressure Support / IPAP: 6 cmH20  Mean Airway Pressure: 8 cmH20    Intake/Output Summary (Last 24 hours) at 03/20/2020 1333  Last data filed at 03/20/2020 1100  Gross per 24 hour   Intake 400 ml   Output 1620 ml   Net -1220 ml    Physical Exam  General: no acute distress, some conversational dyspnea  Neuro: alert and oriented x 4  Cardiac: regular rate and rhythm, no murmurs, rubs, or gallops  Lungs: b/l rhonchi, no rales or wheeze  Abdomen: soft, nontender, nondistended  Ext: warm, dry, trace LE edema          Scheduled Meds: PRN Meds:    apixaban, 5 mg, Oral, Q12H SCH  famotidine, 20 mg, Oral, Daily  fluticasone, 1 spray, Each Nare, Daily  furosemide, 40 mg, Intravenous, Once  levothyroxine, 50 mcg,  Oral, Daily at 0600  midodrine, 10 mg, Oral, TID MEALS  piperacillin-tazobactam, 4.5 g, Intravenous, Q8H  selexipag, 1,200 mcg, Oral, Q12H SCH  senna-docusate, 1 tablet, Oral, Q12H SCH  sodium bicarbonate, 650 mg, Oral, QID        Continuous Infusions:   acetaminophen, 650 mg, Q8H PRN  albuterol-ipratropium, 3 mL, Q6H PRN  benzocaine-menthol, 1 lozenge, Q1H PRN  calcium carbonate, 500 mg, 4X Daily PRN  lidocaine viscous, 5 mL, Q6H PRN  ondansetron, 4 mg, Q8H PRN  phenol, 1 spray, Q2H PRN  saline, , PRN  saline, 1 spray, PRN            Labs (last 72 hours):  Recent Labs     03/20/20  0347 03/18/20  0342   WBC 11.49* 13.45*   Hgb 9.7* 10.3*   Hematocrit 30.7* 31.3*   Plt 210    No results for input(s): PT, INR, PTT in the last 72 hours.     Lactic Acid 1.7 -> 2.5    BNP 186 (103) 12/11    AST 24  ALT 22  ALP 114  Recent Labs     03/20/20  0347  03/19/20  0447 03/18/20  0342   Sodium 148* 148* 143   Potassium 3.9 3.8 3.7   Chloride 110 112* 110   CO2 21* 22 20*   BUN 34.0* 39.0* 41.0*   Creatinine 1.4* 1.4* 1.7*   Glucose 123* 100 105*   Calcium 8.8 8.8 8.4   Magnesium 2.0 2.0 2.0   Phosphorus 3.9 3.9 4.5       UA 12/13  -Neg LE, nitrites, protein.   -Small blood  -Hyaline casts 6-10     ABG: no new    Microbiology:   12/13 Legionella Urine Ag negative   12/11 Sputum stain/cx: mod mixed respiratory flora  12/11 MRSA nares/throat negative   12/11 Urine cx negative   12/11 Throat cx negative   12/11 Blood cx NGTD x 2d    Imaging:  XR Chest AP Portable    Result Date: 03/20/2020   Slightly decreasing vascular congestion and interstitial edema. Remainder as above. Wilmon Pali, MD  03/20/2020 7:47 AM     TTE 12/10    * Left ventricular systolic function is normal with an ejection fraction of  55 to 60%.    * Increased left ventricular wall thickness towards the apex,    * morphology suggestive of apical    * variant hypertrophic cardiomyopathy.    * Decreased right ventricular systolic function.    * The left atrium is  moderately dilated.    * The right atrium is dilated.    * No evidence of interatrial shunt by color Doppler or agitated saline (with  and without provocative maneuver).    * There is severe tricuspid regurgitation.    * Severe pulmonary hypertension with estimated right ventricular systolic  pressure of  85 mmHg.    * The IVC is dilated with < 50% respiratory variance consistent with  significantly elevated RA pressure of 15 mmHg.    * Small posterior pericardial effusion visualized.    * Compared to the prior study on 02/02/2020,  TR is worse.    CXR 12/13 new L lingular/lower lobe infiltrate      Assessment and Plan:  74 y.o. female with PMH of Afib, CHF 2/2 HCM, Group 1/2 PAH/PH, COPD, ILD, cardiac arrest after anesthesia who presented on 04-03-20 after post extubation acute hypoxic respiratory failure following elective upgrade of single to dual chamber pacemaker. Hospital course c/b hypotension c/f possible shock, sepsis, though no clear source. Hypotension could also be related to vasodilation d/t selexipag.     Neuro:   NAI      Cardio:   #Shock  -Borderline hypotension w/evidence of end organ ischemia w/rising lactic acid and AKI.  -Etiology unclear, cardiogenic vs. distributive 2/2 sepsis; no evidence of hypovolemic/hemorrhagic shock.  -Infectious w/u as below  -Midodrine 10 mg TID for pressure support; may help with systemic vasodilation 2/2 PH meds    #HCM  #HFpEF (50%)  -Repeat echo shows LVEF 55-60% and severe TR worsened from previous echo in Oct 2021, severe PH w/RVSP 85 mmHg.  -Caution w/fluid balance in setting of restrictive physio from HCM  -40 mg IV lasix x 1 this AM; goal net negative 500 mL - 1L    #Hx Afib  -S/p pacemaker upgrade 12/9.   -Eliquis 5 mg q12h    #PAH- on chart review either group 1 or 2  -See resp      Resp:   #Acute hypoxic respiratory failure  #PAH/PH Group 1 vs. 2  Originally appeared to  be 2/2 anesthesia w/increasd afterload to RV. Evidence of shunt by A-a gradient 303.7  on admission. Could be explained by aspiration now developed into pna but likely 2/2 PH as well. Somewhat volume overloaded on admission to CCU but now approaching euvolemia with tight window of fluid mgmt 2/2 restrictive physiology of HCM.   -HFNC @ FiO2 100% and flow 40 L/min; titrate to maintain SpO2 > 88.   -C/w CPAP at nighttime while sleeping  -Off NO  -Uptravi (selexipag) 1,200 mcg po q12h  -Advanced lung following, appreciate recs => discuss other tx options to enable weaning of HFNC given suspicion that PH is predominantly driving this process    #PNA 2/2 aspiration   -See ID      #Congestion  -Flonase BID   -Nasal saline prn    #Anterior tonsillar pillar avulsion  -Likely 2/2 intubation trauma  -New exudates seen on b/l adenoids  -ENT evaluated, recommend conservative mgmt      Renal /Fluid, Electrolytes:   #AKI on CKD - improving  -Cr baseline 1.1  -Cr rose from baseline to 1.7 overnight in setting of lasix admin  -Likely ATN 2/2 cardiorenal syndrome and shock vs. developing prerenal component BUN:Cr rising now > 20  -Management of underlying cause(s) as above  -Avoid nephrotoxic agents    #Hypernatremia  -Na 148 since 12/13  -CTM  -If continues will correct with free H2O    #AGMA - 2/2 lactic acidosis   #MA - 2/2 ?RTA, unclear etiology   -Na-HCO3 650 mg QID      GI:   #Nutrition  -Heart healthy diet    #PUD ppx  #GERD  -Takes protonix 40 mg po qD at home  -Pepcid 20 mg po qD     #Bowel regimen  -Pericolace 8.6-50 mg q12h       Infectious Disease (ID):   #Shock  -Possible sepsis, aspiration pna most likely but micro negative  -CXR nonfocal suggestive of fluid > infiltrates  -F/u blood cx NGTD x1D  -Throat cx negative   -Sputum cx mixed upper respiratory flora  -MRSA nares/throat negative  -Urine cx negative  -Vanc IV per pharmacy (12/11 - 12/12 ), [place] given negative MRSA swab  -Zosyn 4.5 mg IV q8h (12/11 - )        Hem/Onc:   -Eliquis for VTE ppx in setting of Afib as above      Endo:    #Hypothyroidism  -Synthroid 50 mcg qD       Prophylaxis:  - DVT: Home eliquis  - GI:  Pepcid in place of home protonix    Code Status: DNR/DNI  Dispo: Wean nitric oxide, ok to be stepped down to APU with HFNC    Lines/Drains/Airways:  -PIV x 2  -Foley       Signed by: Alyson Ingles, MD, MD  Date/Time: 03/20/20 1:33 PM      [facility] Attending Attestation:    This patient has a high probability of sudden clinically significant deterioration which requires the highest level of physician preparedness to intervene urgently. I managed/supervised life or organ supporting interventions that required frequent physician assessments.   I devoted my full attention to the direct care of this patient for this period of time.      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, OSA on CPAP QHS, apical variant hypertrophic cardiomyopathy, paroxysmal AF on Eliquis, severe PAH (possibly mixed groups 1 and 2) on home 02 5 l sats 70's 92, most recently on selexipag (though Adv HF and Adv Lung notes are unclear on whether pt has benefited from pulmonary vasodilators--and has perhaps even worsened on them), 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  Pf note, pt is a Jehovah's witness and declines blood  transfusions.    Overnight  Minimal epistaxis overnight  Weaned off nitro    Neuro:  - pt wishes to be DNR/DNI  - Palliative following    CV:  - decreased home selexipag, given hypotension  - weaning iNO gradually: 20 --> 15 ppm --> plan to wean to OFF no exit startegy keep off- BP not that far from baseline (~100/60)  -  starting midodrine and antibiotics  - midodrine 10 TID   - very low cardiac output at baseline, hence upgrade from single-chamber pacemaker to dual-chamber pacemaker (in hopes synchronization would augment her CO)  - TTE w/ bubble: LVEF 55-60%, apical variant HCM, grade III diastolic dysfunction, no evidence of interatrial shunt, severe PH RVSP 85, severe TR, TAPSE 0.70, small posterior pericardial effusion  - Echo w/ bubble 09/08/17 found no evidence of right to left shunt, though AF ablation procedure in 2017 included transseptal puncture  - cautious w/ diuresis considering HCM goal -1l    Pulm:  - CXR w/ prominent vasculature and new opacities  - spot diuresis as tolerated as above  - started abx 12/11, given productive sputum continue x7 d  - albuterol BID for airway clearance  - IPV vs Acapella for airway clearance  - CPAP QHS  - previously: HRCT without ILD or pulmonary fibrosis; dual energy CTA negative for clots  - Advanced Lung following    ID: NAI  - now w/ productive cough; pt may have aspirated during anesthesia w/ LMA  - s/p Zosyn 12/11- consider meropenem if no improvement   - pan-culture, including Legionella Ag and RVP  - MRSA nares negative  - UA+ but UCx negative  - also w/ sore throat and erythema / pus on right posterior pharynx, perhaps secondary to trauma w/ intubation  - ENT performed rhinolaryngoscopy: anterior tonsillar pillar avulsion likely secondary to intubation  - throat culture: "no beta hemolytic Streptococcus group A, C, or G isolated, no further work"    Renal/Fluid, Electrolytes:  - NAGMA, unclear etiology  - bicarb tabs  - worsening AKI and hypotension  12/11: held diuresis  - Lasix 40 daily since 12/12 given improved hemodynamics and Cr  - s/p kayexalate x 1 for hyper-K+  - developing hypernatremia likely secondary to minimal PO secondary to sore throat, not overdiuresis    Heme:  - c/w home Eliquis (AF)  - pt is a Jehovah's witness and declines blood transfusions    GI/Nurition:feeding    Endo:  - c/w home [email or web address]    Lines/Tubes:   - none    PPx:              -  DVT: Eliquis              -GI: famotidine home med  Code status: DNR / DNI                    Any critical care time was performed today and is exclusive of teaching, billable procedures, and not overlapping with any other providers.     I have personally examined the patient and reviewed the patients history, physical exam, and additional data as recorded in the ICU team note  and I agree with care plan as recorded.     Critical care time:52minutes

## 2020-03-21 ENCOUNTER — Inpatient Hospital Stay: Payer: Medicare Other

## 2020-03-21 DIAGNOSIS — I4891 Unspecified atrial fibrillation: Secondary | ICD-10-CM

## 2020-03-21 DIAGNOSIS — J9811 Atelectasis: Secondary | ICD-10-CM

## 2020-03-21 DIAGNOSIS — J9621 Acute and chronic respiratory failure with hypoxia: Principal | ICD-10-CM

## 2020-03-21 DIAGNOSIS — I50810 Right heart failure, unspecified: Secondary | ICD-10-CM

## 2020-03-21 LAB — PHOSPHORUS: Phosphorus: 4.2 mg/dL (ref 2.3–4.7)

## 2020-03-21 LAB — CBC AND DIFFERENTIAL
Absolute NRBC: 0.39 10*3/uL — ABNORMAL HIGH (ref 0.00–0.00)
Basophils Absolute Automated: 0.05 10*3/uL (ref 0.00–0.08)
Basophils Automated: 0.4 %
Eosinophils Absolute Automated: 0.36 10*3/uL (ref 0.00–0.44)
Eosinophils Automated: 3.1 %
Hematocrit: 28 % — ABNORMAL LOW (ref 34.7–43.7)
Hgb: 9 g/dL — ABNORMAL LOW (ref 11.4–14.8)
Immature Granulocytes Absolute: 0.1 10*3/uL — ABNORMAL HIGH (ref 0.00–0.07)
Immature Granulocytes: 0.9 %
Lymphocytes Absolute Automated: 0.85 10*3/uL (ref 0.42–3.22)
Lymphocytes Automated: 7.3 %
MCH: 32.1 pg (ref 25.1–33.5)
MCHC: 32.1 g/dL (ref 31.5–35.8)
MCV: 100 fL — ABNORMAL HIGH (ref 78.0–96.0)
MPV: 11.6 fL (ref 8.9–12.5)
Monocytes Absolute Automated: 1.02 10*3/uL — ABNORMAL HIGH (ref 0.21–0.85)
Monocytes: 8.8 %
Neutrophils Absolute: 9.23 10*3/uL — ABNORMAL HIGH (ref 1.10–6.33)
Neutrophils: 79.5 %
Nucleated RBC: 3.4 /100 WBC — ABNORMAL HIGH (ref 0.0–0.0)
Platelets: 180 10*3/uL (ref 142–346)
RBC: 2.8 10*6/uL — ABNORMAL LOW (ref 3.90–5.10)
RDW: 19 % — ABNORMAL HIGH (ref 11–15)
WBC: 11.61 10*3/uL — ABNORMAL HIGH (ref 3.10–9.50)

## 2020-03-21 LAB — BASIC METABOLIC PANEL
Anion Gap: 14 (ref 5.0–15.0)
BUN: 39 mg/dL — ABNORMAL HIGH (ref 7.0–19.0)
CO2: 24 mEq/L (ref 22–29)
Calcium: 8.6 mg/dL (ref 7.9–10.2)
Chloride: 108 mEq/L (ref 100–111)
Creatinine: 1.5 mg/dL — ABNORMAL HIGH (ref 0.6–1.0)
Glucose: 103 mg/dL — ABNORMAL HIGH (ref 70–100)
Potassium: 3.9 mEq/L (ref 3.5–5.1)
Sodium: 146 mEq/L — ABNORMAL HIGH (ref 136–145)

## 2020-03-21 LAB — GFR: EGFR: 41

## 2020-03-21 LAB — LACTIC ACID, PLASMA
Lactic Acid: 1.5 mmol/L (ref 0.2–2.0)
Lactic Acid: 1.5 mmol/L (ref 0.2–2.0)

## 2020-03-21 LAB — MAGNESIUM: Magnesium: 1.9 mg/dL (ref 1.6–2.6)

## 2020-03-21 MED ORDER — OXYMETAZOLINE HCL 0.05 % NA SOLN
2.0000 | Freq: Two times a day (BID) | NASAL | Status: AC
Start: 2020-03-21 — End: 2020-03-25
  Administered 2020-03-21 – 2020-03-22 (×3): 2 via NASAL
  Filled 2020-03-21: qty 30

## 2020-03-21 MED ORDER — TORSEMIDE 20 MG PO TABS
40.0000 mg | ORAL_TABLET | Freq: Every day | ORAL | Status: DC
Start: 2020-03-21 — End: 2020-03-22
  Administered 2020-03-21: 11:00:00 40 mg via ORAL
  Filled 2020-03-21: qty 2

## 2020-03-21 MED ORDER — MAGNESIUM SULFATE IN D5W 1-5 GM/100ML-% IV SOLN
1.0000 g | Freq: Once | INTRAVENOUS | Status: AC
Start: 2020-03-21 — End: 2020-03-21
  Administered 2020-03-21: 09:00:00 1 g via INTRAVENOUS
  Filled 2020-03-21: qty 100

## 2020-03-21 MED ORDER — ALBUTEROL-IPRATROPIUM 2.5-0.5 (3) MG/3ML IN SOLN
3.0000 mL | Freq: Two times a day (BID) | RESPIRATORY_TRACT | Status: DC
Start: 2020-03-21 — End: 2020-03-22
  Administered 2020-03-21 – 2020-03-22 (×2): 3 mL via RESPIRATORY_TRACT
  Filled 2020-03-21 (×2): qty 3

## 2020-03-21 MED ORDER — PREDNISONE 5 MG PO TABS
10.0000 mg | ORAL_TABLET | Freq: Every morning | ORAL | Status: DC
Start: 2020-03-21 — End: 2020-03-22
  Administered 2020-03-21 – 2020-03-22 (×2): 10 mg via ORAL
  Filled 2020-03-21 (×2): qty 2

## 2020-03-21 MED ORDER — FUROSEMIDE 10 MG/ML IJ SOLN
80.0000 mg | Freq: Once | INTRAMUSCULAR | Status: AC
Start: 2020-03-21 — End: 2020-03-21
  Administered 2020-03-21: 16:00:00 80 mg via INTRAVENOUS
  Filled 2020-03-21: qty 8

## 2020-03-21 NOTE — Progress Notes (Signed)
Patient's Name: [name]   Admit Date: 03/31/2020  Medical Record Number: [id number]   Code Status: No CPR with SUP  Room: FI102/FI102-01  Date/Time: 03/21/20 9:45 AM  Cardiologist of record: IMG    24 Hour Events:    - Provided Lasix 40 mg IV x2.  - Decreased nitric oxide down to 0 and remained off nitric oxide all day and night.  - Lactate fluctuating up and down but otherwise hemodynamically stable.  - HFNC was maintained at 100% FiO2. Placed on NIPPV overnight.     IMPRESSIONS:      Presented for electivechange of single-chamber pacemaker to dual-chamber pacemaker requiring elective intubation for the procedure. Post extubation developed hypoxic respiratory failure and was re-intubated; continued to be hypoxic (mostly related to hx of severe PH)even on 100% FiO2, improved with Veletri).   Brief hypotension requiring pressor support, currently off pressors.   Severe pulmonary hypertension (Group 2 +/- 1, RVSP 85 mmHg)   RHC (06/26/16): RA 4, PA 69/23/38, PCWP 8, TPG 30, Fick CO 4.15 and PVR 7.3, thermal CO 3.14 and PVR 9.6.   Treated with macitentan previously.She did not tolerate tadalafil, selexipag was subsequently added.A trial of riociguat was stopped due to hypotension.    RHC 01/24/20: RA 10, RV 79/5, PA 83/38/53, PCWP 14.    With exercise her PCWP increased to 21 mmHg. She has low output state predominantly driven by her severe pulmonary hypertension   Apical hypertrophic cardiomyopathy with apical aneurysm   TTE (12/10): LVEF 55-60%, hypertrophic cardiomypathy, LA moderately dilated, no evidence of shunt, severe TR, severe PH RVSP 85 mmHg, grade III diastolic dysfunction.   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   Rest of care per [facility]S team    Transfer to step down unit   Discussed with team, [facility], RN, patient    Signed: Viann Shove, MD  PGY-4    Date/Time: 03/21/20 9:45 AM  ---------------------------------------------------------------------------  CICU Cardiology Attending addendum:    Patient seen/examined with cardiology fellow Dr. Dalene Carrow.   Exam notable for RRR, normal S1-S2, clear lungs.   24 hour events: On 50% O2, exertional dyspnea noted.  My assessment/plan as noted above with the following additions:    Impressions:  74 year old female with apical hypertrophic cardiomyopathy with apical aneurysm, severe group 2 pulmonary hypertension, possible ILD, PAF s/p prior ablation/PVI isolation, prior GI bleed, Jehovah's Witness, OSA, admitted to the CICU for hypoxic respiratory failure requiring intubation in the setting of a dual-chamber pacemaker upgrade. CICU course notable for subsequent extubation.    Recommendations:   Continue midodrine   Switch to maintenance diuretic dosing, will initiate torsemide  Continued goals of care discussion. Patient has elected to be DNR/DNI   Continue selexipag   Continue apixaban for PAF   Non contrast chest CT per advanced pulmonary   OK for transfer out of the CICU to APU today.    Patient independently examined by me. Pertinent medical records, studies, labs, imaging studies independently reviewed by me. Treatment of an organ system (cardiac) with high probability of  life-threatening deterioration and need for life-saving interventions. Critical care time spent excluding procedures/teaching 30 minutes from 7AM to 7:30AM.     Dr. Gwynneth Macleod. Nash Shearer, MD [facility]  CICU Cardiology Attending  Spectralink [phone]    ----------------------------    History: [name] is a 74 y.o. female. She has a past medical history of pAFib (On AC) s/p prior CTI ablation/PV isolation and AV junction ablation/implantation of VVI pacemaker, heart failure with preserved ejection fraction due to Apical hypertrophic cardiomyopathy with apical aneurysm, CKD, COPD, COVID-19 (06/2019), disorder of thyroid, gastrointestinal hemorrhage, severe pulmonary hypertension (On oxygen by nasal cannula), OSA on CPAP (CPAP since 2015).    She presented on 03/09/2020 for elective change of single-chamber pacemaker to dual-chamber pacemaker. Per anesthesia reports, patient required intubation under LMA sedation. After extubation she developed hypoxemia and respiratory distress and had to be reintubated.MCCS consulted admitted to CICU for post procedure respiratory failure and hypoxemia and management.    On arrival to the CICU she was very hypoxemic with saturation down to 70s on 100% FiO2 and PEEP of 8. Stat chest x-ray did not show pneumothorax.ET tube in appropriate position. Stat sonographic examination of heart did not show any pericardial tamponade. LV function at baseline.    Objective:    Physical Exam:    Vital signs in last 24 hours:     Temp:  [97.2 F (36.2 C)-98.1 F (36.7 C)] 98.1 F (36.7 C)  Heart Rate:  [69-75] 69  Resp Rate:  [21-44] 27  BP: (92-140)/(51-73) 100/55  FiO2:  [60 %-100 %] 80 %  SpO2: 97 %   O2 Device: HFNC  Height: 161.3 cm (5' 3.5")    Intake/Output:    Intake/Output Summary (Last 24 hours) at 03/21/2020 0945  Last data filed at 03/21/2020 0800  Gross per 24 hour   Intake 622 ml   Output 1240 ml   Net -618 ml     Net IO Since Admission: -4,435.46 mL [03/21/20  0945]    Weight:     Wt Readings from Last 4 Encounters:   03/18/20 85.1 kg (187 lb 9.8 oz)   03/07/20 82.1 kg (181 lb)   03/02/20 82.8 kg (182 lb 9.6 oz)   02/22/20 82.6 kg (182 lb)     Vitals:    03/21/20 0811   BP:    Pulse:    Resp:    Temp:    SpO2: 97%     General: HFNC, yet awake  Neck: Trachea midline, JVD   Pulmonary: Normal respiratory effort, clear lung sounds bilaterally.  Cardiovascular: PMI nondisplaced with normal rate and rhythm, normal S1, normal S2, no murmur, rub, or gallop appreciated.  Abdomen: Soft, nontender.  Extremities: No edema, no clubbing, no cyanosis.    Laboratory:      Basic Metabolic Panel:         03/21/20  0414 03/20/20  0347 03/19/20  0447   Creatinine 1.5* 1.4* 1.4*   BUN 39.0* 34.0* 39.0*   CO2 24 21* 22   Sodium 146* 148* 148*   Potassium  3.9 3.9 3.8   Magnesium 1.9 2.0 2.0   Phosphorus 4.2 3.9 3.9   Chloride 108 110 112*   Calcium 8.6 8.8 8.8     Liver Function Test:        03/20/20  0347 03/16/20  0303 04/01/2020  2031   AST (SGOT) 24 29 26    ALT 22 22 25    Bilirubin, Total 1.1 0.6 0.5     Complete Blood Count:        03/21/20  0414 03/20/20  0347 03/18/20  0342   WBC 11.61* 11.49* 13.45*   Hgb 9.0* 9.7* 10.3*   Hematocrit 28.0* 30.7* 31.3*   Platelets 180 210 191     Cardiac Markers:        03/16/20  1806   B-Natriuretic Peptide 186*     Imaging:   Radiology Results (24 Hour)     Procedure Component Value Units Date/Time    XR Chest AP Portable [[id number]] Collected: 03/21/20 7829    Order Status: Completed Updated: 03/21/20 0721    Narrative:      History: Heart disease with lung opacities. Exam compared prior study  03/20/2020.    FINDINGS:  Portable AP view. 5621.  There is persistent, potentially increasing moderate cardiomegaly and/or  pericardial effusion. Left pacemaker remains in place. Moderate diffuse  interstitial lung opacities are without change. There is no  pneumothorax.      Impression:       Persistent, potentially increasing moderate cardiomegaly  and/or  pericardial effusion. Unchanged moderate diffuse bilateral  interstitial lung opacities. Remainder as above.    Wilmon Pali, MD   03/21/2020 7:19 AM        Cardiographics:     Echocardiogram (03/16/2020):       * Left ventricular systolic function is normal with an ejection fraction of 55 to 60%.    * Increased left ventricular wall thickness towards the apex,morphology suggestive of apical variant hypertrophic cardiomyopathy.    * Decreased right ventricular systolic function.    * The left atrium is moderately dilated.    * The right atrium is dilated.    * No evidence of interatrial shunt by color Doppler or agitated saline (with and without provocative maneuver).    * There is severe tricuspid regurgitation.    * Severe pulmonary hypertension with estimated right ventricular systolic pressure of  85 mmHg.    * The IVC is dilated with < 50% respiratory variance consistent with significantly elevated RA pressure of 15 mmHg.    * Small posterior pericardial effusion visualized.    * Compared to the prior study on 02/02/2020,  TR is worse.    Inpatient Medications:    Scheduled:      apixaban, 5 mg, Oral, Q12H SCH    famotidine, 20 mg, Oral, Daily    fluticasone, 1 spray, Each Nare, Daily    levothyroxine, 50 mcg, Oral, Daily at 0600    magnesium sulfate, 1 g, Intravenous, Once    midodrine, 10 mg, Oral, TID MEALS    oxymetazoline, 2 spray, Each Nare, Q12H SCH    piperacillin-tazobactam, 4.5 g, Intravenous, Q8H    selexipag, 1,200 mcg, Oral, Q12H SCH    senna-docusate, 1 tablet, Oral, Q12H SCH    sodium bicarbonate, 650 mg, Oral, QID    Infusions:      Home Medications     Med List Status: Complete Set By: Tania Ade, RN at 04/03/2020  1:48 PM  allopurinol (ZYLOPRIM) 100 MG tablet     Take 1 tablet (100 mg total) by mouth daily Per pt taking 50mg  (half tab)     Notes:  Take morning of procedure per MD     apixaban (ELIQUIS) 5 MG     Take 1 tablet (5 mg total) by mouth every 12 (twelve)  hours     Notes:  Last dosage 03/11/2020 p.m per MD     atorvastatin (LIPITOR) 80 MG tablet     Take 80 mg by mouth every evening        colchicine 0.6 MG tablet     Take 1 tablet (0.6 mg total) by mouth daily     Patient taking differently: Take 0.6 mg by mouth every morning        Notes:  Take morning of procedure per MD     ezetimibe (ZETIA) 10 MG tablet     Take 10 mg by mouth nightly        ferrous sulfate 325 (65 FE) MG tablet     Take 325 mg by mouth Once every Monday, Wednesday and Friday morning Taking on Monday wed, friday / severe GI bleed 11/2019       Notes:  Hold morning of procedure per guidelines         furosemide (LASIX) 40 MG tablet     Take 1 tablet (40 mg total) by mouth 2 (two) times daily     Patient taking differently: Take 40 mg by mouth every morning        Notes:  Hold morning of procedure per MD       levothyroxine (SYNTHROID) 50 MCG tablet     Take 50 mcg by mouth Once a day at 6:00am     Notes:  Take morning of procedure per MD     Multiple Vitamin (multivitamin) capsule     Take 1 capsule by mouth daily     Notes:  Hold 7 days prior to procedure per guidlines     OXYGEN-HELIUM IN     Inhale 4-6 L/min into the lungs        pantoprazole (PROTONIX) 40 MG tablet     Take 40 mg by mouth nightly        potassium chloride (K-DUR) 10 MEQ tablet     Take 10 mEq by mouth daily        Notes:  Hold morning of procedure per guidelines     Selexipag (Uptravi) 1600 MCG Tab     Take 1,600 mcg by mouth 2 (two) times daily     Patient taking differently: Take 1,600 mcg by mouth 2 (two) times daily        Notes:  Take morning of procedure per MD     spironolactone (ALDACTONE) 25 MG tablet     Take 1 tablet (25 mg total) by mouth 2 (two) times daily     Patient taking differently: Take 25 mg by mouth daily        Notes:  Hold morning of procedure per MD     vitamin D (CHOLECALCIFEROL) 25 MCG (1000 UT) tablet     Take 2,000 Units by mouth daily     Notes:  Hold 7 days prior to procedure per guidlines

## 2020-03-21 NOTE — Plan of Care (Signed)
No acute events over night, pt a&ox4 for shift - slightly anxious when transferring from chair to bed w/ dyspnea on exertion. In a.flutter w/ rates between 69-72 and paced. Pt net positive 27 for shift.

## 2020-03-21 NOTE — Progress Notes (Signed)
[facility] Palliative Medicine & Comprehensive Care   Service Phone Number: FX: [phone], Mon-Fri 9a-4p / [employer]tend Pager: #[phone] (24/7)     Palliative Care Progress Note   Date Time: 03/21/20 11:24 AM   Patient Name: [name]   Location: FI102/FI102-01   Attending Physician: Orpah Clinton, MD   Primary Care Physician: Georgeanna Lea, MD   Consulting Provider: Namon Cirri, MD   Consulting Service: Palliative Medicine and Comprehensive Care  Consulted request from Orpah Clinton, MD to see patient regarding:   Reason for Referral: Clarify goals of care; Symptom (dyspnea, nausea, anxiety, fatigue, etc.)          Assessment & Plan   Impression   [name] with PAHon home O2, PAF s/p ablation on eliquis, dCHF and hypertrophic cardiomyopathy and question of ILD as well asreports if auto-immune lung condition who presented for an elective single-to-dual champer ICD upgrade whose course was c/b hypotension/hypoxia, now extubated POD1 but remains on HFNC. Palliative medicine consulted for goals of care          Estimated Prognosis: Weeks to months         Recommendations   1. Goals of Care:   - "I want to go home and live"  - "I am realistic and know that I am very sick and have limited options"  - requests open/honest updates as to how she is doing and how realistic her goal of getting home is  - Declines further attempts at intubation  - declines CPR  - Requests completion of Advanced directive, done today  - POST completed  - not interested in home hospice, but is interested in home palliative  - referral for home palliative care order placed  - she would like to donate her body to a medical school for research.  ---- she has asked [name] to reach out to [facility] and [employer] -- both not accepting donations  ---- will connect with [employer] today  ---- if [employer] unable, [employer] and [employer] able to accept donations    Barriers to her primary goal of care to get  home  - needs to wean off iNO - successfully done  - needs to wean down oxygen requirement      Code Status:       - NO CPR - SUPPORT OK      Advance Care Planning:  ACP Validation: Valid advanced care planning documents completed during admission and Valid durable DNR/POST document completed during admission  Medical Decision Maker: Advance directives: Health Care Agent: [name] [phone] (Relationship: friend)  ACP Document: HCPOA and DDNR/POST     2. Psychosocial: pallitive medicine LCSW following    3. Spiritual:   - Religion: Jehovah's Witness.   - Chaplain available for support as needed    4. PC Team follow-up plans: tomorrow      Discharge Disposition: Home      Outpatient Follow Up Recommended: Yes    Outcomes: Family meetings, Clarified goals of care, Provided advance care planning, Completed durable DNR, ACP counseling assistance and POST completion  35 minutes.  Total time on unit today in care of [name]   including chart review, face to face evaluation and management. >50% of time spent in counseling & coordination of care with Patient, Family, Consultants and referring provider with recommendations above.       Start Time:  [phone]  Stop Time:   1000       Namon Cirri, MD, MD  Palliative Medicine & Comprehensive Care  Phone Number: [phone], Mon-Fri 9a-4p   Xtend Pager: [phone] (24/7)     Interval History   [name] is a 74 y.o. Best admitted to hospital on 04/12/20 with Encounter for pacemaker at end of battery life [Z45.010]  Pacemaker generator end of life [Z45.010]     Patient see at bedside and discussed with primary team    Patient had mentioned at a prior visit that she would like to donate her body at the time of her death to medical school for research.  She stated that her preference was [employer] or [employer] but was unable to contact anyone to make these arrangements.  We were able to reach both universities and  unfortunately neither is excepting donations.  We discussed that with the patient today.  She requested that we contact [facility] as an alternative.  Additionally we learned that the uniform services and [facility] both are associated with medical schools and are willing to accept donations.  The patient shared that if [facility] is not an option she would be willing to pursue either of these avenues.  We advised her that we will look into this further today.    She reports that her breathing feels okay, she has no pain, nausea or problems with her appetite.  She reports that her nose bleeding has slowed down but still persists.       Goals of Care   CURRENT CPR Status: NO CPR - SUPPORT OK          Advanced Care Planning:      Decisional Capacity: yes      Advance Directives have been completed in the past: no      Advance Directives are available in chart: yes      Discussed this admission: yes      ACP note completed: yes       Date: 03/19/20      GOC Discussion: "I want to go home to live, but I understand that I am very sick and my options are limited" - declines hospice informational visit at this time  - would like to donate her body to a medical school for research/education      Outcome of Discussion: treatment/curative pathway        Palliative Functional and Symptom Assessment     Palliative Performance Scale: 70% - Reduced ambulation, unable to do normal work, some evidence of disease, full self-care, normal or reduced intake, full LOC    [place] Symptom Assessment Scale (ESAS): Completed  Anxiety: 0  Depression: 0  Drowsiness: 0  Lack of Appetite: 2  Nausea: 0  Pain: 0  Shortness of Breath: 4  Tiredness: 4  Well-being: 4     Medications   Scheduled Meds  Current Facility-Administered Medications   Medication Dose Route Frequency    apixaban  5 mg Oral Q12H SCH    famotidine  20 mg Oral Daily    fluticasone  1 spray Each Nare Daily    levothyroxine  50 mcg Oral Daily at 0600    midodrine  10 mg  Oral TID MEALS    oxymetazoline  2 spray Each Nare Q12H SCH    piperacillin-tazobactam  4.5 g Intravenous Q8H    predniSONE  10 mg Oral QAM W/BREAKFAST    selexipag  1,200 mcg Oral Q12H [facility]  senna-docusate  1 tablet Oral Q12H SCH    sodium bicarbonate  650 mg Oral QID    torsemide  40 mg Oral Daily      DRIPS     PRN MEDS  Current Facility-Administered Medications   Medication Dose    acetaminophen  650 mg    albuterol-ipratropium  3 mL    benzocaine-menthol  1 lozenge    calcium carbonate  500 mg    lidocaine viscous  5 mL    ondansetron  4 mg    phenol  1 spray    saline      saline  1 spray       Allergies   Allergies   Allergen Reactions    Amiodarone Other (See Comments)     Lung toxicity    Cellcept [Mycophenolate]      Gastro hemorrhages     Biopatch Protective Disk-Chg [Chlorhexidine] Itching    Hibiclens [Chlorhexidine Gluconate] Itching     "Severe itiching"    Zyban [Bupropion] Itching       Physical Exam   BP 100/55    Pulse 69    Temp 98.1 F (36.7 C) (Oral)    Resp (!) 27    Ht 1.613 m (5' 3.5")    Wt 85.1 kg (187 lb 9.8 oz)    SpO2 97%    BMI 32.71 kg/m    Physical Exam:  General: well developed, chronically ill appearing woman in NAD   HEENT:  EOMI, anicteric, HFNC in place, no epistaxis, MMM  Neck: supple   CV: irreg rate  Lungs: tachypneic, on HFNC, mild crackles at R base   Abd: soft, ND, NABS  Ext: Trace pedal edema  Neuro: awake, alert, oriented x 3, no focal deficits  Psych:  appropriate insight and judgement, mood and affect congruent to current situation  Skin: no rashes or lesions noted      Labs / Radiology   Lab and diagnostics: reviewed in Epic  Recent Labs   Lab 03/21/20  0414   WBC 11.61*   Hgb 9.0*   Hematocrit 28.0*   Platelets 180              Recent Labs   Lab 03/21/20  0414   Sodium 146*   Potassium 3.9   Chloride 108   CO2 24   BUN 39.0*   Creatinine 1.5*   EGFR 41.0   Glucose 103*   Calcium 8.6     Recent Labs   Lab 03/20/20  0347   Bilirubin, Total 1.1    Bilirubin Direct 0.4   Protein, Total 6.3   Albumin 2.7*   ALT 22   AST (SGOT) 24          CT Head WO Contrast    Result Date: 03/16/2020   CT scan of the head shows no hydrocephalus, herniation, or intracranial hemorrhage. There is no visible acute intracranial abnormality. Miguel Dibble, MD  03/16/2020 5:11 AM    XR Chest AP Portable    Result Date: 03/21/2020   Persistent, potentially increasing moderate cardiomegaly and/or pericardial effusion. Unchanged moderate diffuse bilateral interstitial lung opacities. Remainder as above. Wilmon Pali, MD  03/21/2020 7:19 AM    XR Chest AP Portable    Result Date: 03/20/2020   Slightly decreasing vascular congestion and interstitial edema. Remainder as above. Wilmon Pali, MD  03/20/2020 7:47 AM    XR Chest AP Portable    Result Date: 03/19/2020  Findings of pulmonary  edema, bilateral pleural effusions and multifocal airspace opacity. Colonel Bald, MD  03/19/2020 8:53 AM    XR Chest AP Portable    Result Date: 03/18/2020    CHF Gerlene Burdock, MD  03/18/2020 8:17 AM    XR Chest AP Portable    Result Date: 03/17/2020  . Improved edema post extubation Marty Heck, MD  03/17/2020 12:19 PM    XR Chest AP Portable    Result Date: 03/11/2020  1. Cardiac enlargement and increased vascular congestion 2. Bibasilar opacities and increased lingular opacities, likely atelectasis Genelle Bal  03/30/2020 8:17 PM

## 2020-03-21 NOTE — Plan of Care (Signed)
ICU Checklist        Ventilator Bundle     Patient on ventilator: No          Line Bundle  Central line present: No    Date inserted: n/a  Type: n/a  Location: n/a  Can central line be removed?: Not Applicable   PIV x 3     Foley Bundle  Foley catheter present?: No    Date placed:  Can urinary catheter be removed: Not Applicable     General Care Bundle  Restraints Renewed?: no  Fluid balance achieved?: No  DVT Prophylaxis ordered?: Yes  If no, choose one of the following:   []?? Active Bleed     []?? Coagulopathy   []?? Thrombocytopenia  []?? Other:   Nutrition ordered?: yes   If no, choose one of the following:  []?? NPO    []?? Aspiration risk   []?? Ileus   []?? Hemodynamically instable  []?? Other:   Bowel movement in last 48 hours?: Yes  If no, bowel regimen in place?: Yes  PUD prophylaxis: Yes  Mobility protocol: PT/OT ordered? Yes   

## 2020-03-21 NOTE — Progress Notes (Signed)
0700H assumed care. A/O x 4. Afebrile. No complaint of pain. Able to transfer to the commode.   VPacing in the 69-70s. SBP~90s-120s.   On HFNC 70-100% depending on activity. Oximask removed. Small amount of secretion from coughing. On fluid restriction of 2 L/day, instructed. IFC draining minimal UO, resident informed. Torsemide started, lasix 40mg  IVP, given. I&O +440 this shift. 3x loose dk. Brown BM. With transfer orders. Safety measures in place. Continue plan of care.
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
## 2020-03-21 NOTE — Progress Notes (Addendum)
ICU/CCU Daily Progress Note    Patient's Name: [name]    Room:  ZO109/UE454-09  Attending Provider: Orpah Clinton, MD  Admit Date:04/01/2020  Medical Record Number: [id number]     Date/Time: 03/21/20 7:51 AM    74 year old female w/ hx afib, CHF, group 1 or 2 PAH, HCM, COPD, ILD, cardiac arrest after anesthesia presents with profound acute hypoxic respiratory failure after extubation following elective upgrade of single chamber pacemaker to dual chamber pacemaker.      24hr events:  Was as low as FiO2 80% but increased to 100% later this AM.     Pt seen at bedside. She denies SOB, CP, HA, dizziness, F/C. Her cough is still productive but improved from a few days ago.         VITAL SIGNS PHYSICAL EXAM   Temp:  [97.2 F (36.2 C)-98 F (36.7 C)] 98 F (36.7 C)  Heart Rate:  [69-75] 69  Resp Rate:  [21-44] 23  BP: (92-140)/(51-73) 102/58  FiO2:  [60 %-100 %] 100 %   Pulse ox: 91-100%  MAP 70-78    Blood Glucose: 123    Telemetry: NSR    Vent Settings  Vent Mode: PS/CPAP in NIV  FiO2: 100 %  Resp Rate (Set): 0  Vt (Set, mL): 0 mL  PIP Observed (cm H2O): 8 cm H2O  PEEP/EPAP: 6 cm H20  Pressure Support / IPAP: 6 cmH20  Mean Airway Pressure: 6 cmH20    Intake/Output Summary (Last 24 hours) at 03/21/2020 0719  Last data filed at 03/21/2020 0413  Gross per 24 hour   Intake 400 ml   Output 1265 ml   Net -865 ml    Physical Exam  General: NAD  Neuro: A&O x 4  Cardiac: RRR, no m/r/g  Lungs: rhonchi and bibasilar rales, no wheeze  Abdomen: soft, NT, ND  Ext: warm, dry, trace LE edema          Scheduled Meds: PRN Meds:    apixaban, 5 mg, Oral, Q12H SCH  famotidine, 20 mg, Oral, Daily  fluticasone, 1 spray, Each Nare, Daily  levothyroxine, 50 mcg, Oral, Daily at 0600  midodrine, 10 mg, Oral, TID MEALS  piperacillin-tazobactam, 4.5 g, Intravenous, Q8H  selexipag, 1,200 mcg, Oral, Q12H SCH  senna-docusate, 1 tablet, Oral, Q12H SCH  sodium bicarbonate, 650 mg, Oral, QID        Continuous Infusions:    acetaminophen, 650 mg, Q8H PRN  albuterol-ipratropium, 3 mL, Q6H PRN  benzocaine-menthol, 1 lozenge, Q1H PRN  calcium carbonate, 500 mg, 4X Daily PRN  lidocaine viscous, 5 mL, Q6H PRN  ondansetron, 4 mg, Q8H PRN  phenol, 1 spray, Q2H PRN  saline, , PRN  saline, 1 spray, PRN            Labs (last 72 hours):  Recent Labs     03/21/20  0414 03/20/20  0347   WBC 11.61* 11.49*   Hgb 9.0* 9.7*   Hematocrit 28.0* 30.7*   Plt 180    No results for input(s): PT, INR, PTT in the last 72 hours.     Lactic Acid 2.5 -> 3.3 -> 2.1 -> 1.5   Recent Labs     03/21/20  0414 03/20/20  0347 03/19/20  0447   Sodium 146* 148* 148*   Potassium 3.9 3.9 3.8   Chloride 108 110 112*   CO2 24 21* 22   BUN 39.0* 34.0* 39.0*   Creatinine 1.5* 1.4* 1.4*  Glucose 103* 123* 100   Calcium 8.6 8.8 8.8   Magnesium 1.9 2.0 2.0   Phosphorus 4.2 3.9 3.9        ABG: no new    Microbiology:   12/13 Legionella Urine Ag negative   12/11 Sputum stain/cx: mod mixed respiratory flora  12/11 MRSA nares/throat negative   12/11 Urine cx negative   12/11 Throat cx negative   12/11 Blood cx NGTD x 3d    Imaging:  XR Chest AP Portable    Result Date: 03/20/2020   Slightly decreasing vascular congestion and interstitial edema. Remainder as above. Wilmon Pali, MD  03/20/2020 7:47 AM     CXR 12/15 -> Increased congestion/interstitial edema compared to yesterday     TTE 12/10    * Left ventricular systolic function is normal with an ejection fraction of  55 to 60%.    * Increased left ventricular wall thickness towards the apex,    * morphology suggestive of apical    * variant hypertrophic cardiomyopathy.    * Decreased right ventricular systolic function.    * The left atrium is moderately dilated.    * The right atrium is dilated.    * No evidence of interatrial shunt by color Doppler or agitated saline (with  and without provocative maneuver).    * There is severe tricuspid regurgitation.    * Severe pulmonary hypertension with estimated right ventricular  systolic  pressure of  85 mmHg.    * The IVC is dilated with < 50% respiratory variance consistent with  significantly elevated RA pressure of 15 mmHg.    * Small posterior pericardial effusion visualized.    * Compared to the prior study on 02/02/2020,  TR is worse.      Assessment and Plan:  74 y.o. 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 .     Neuro:   NAI  Encephalopathy due to hypoxia resolved      Cardio:   #Shock not septic rv failure cardiogenic most liekley  -Borderline hypotension w/evidence of end organ ischemia w/rising lactic acid and AKI.  -Etiology unclear, cardiogenic vs. distributive 2/2 sepsis; no evidence of hypovolemic/hemorrhagic shock.  -Aspiration pna mgmt as below  -Midodrine 10 mg TID for pressure support; may help with systemic vasodilation 2/2 PH meds  -Goal MAP > 65, @ goal w/midodrine    #HCM  #HFpEF (50%)  -Repeat echo shows LVEF 55-60% and severe TR worsened from previous echo in Oct 2021, severe PH w/RVSP 85 mmHg.  -Caution w/fluid balance in setting of restrictive physio from HCM  -40 mg IV lasix x 1 this AM; goal net negative 500 mL - 1L    #Hx Afib  -S/p pacemaker upgrade 12/9.   -Eliquis 5 mg q12h    #PAH- on chart review either group 1 or 2  -See resp      Resp:   #Acute hypoxic respiratory failure  #PAH/PH Group 1 vs. 2  #PNA 2/2 aspiration   Originally appeared to be 2/2 anesthesia w/increasd afterload to RV. Evidence of shunt by A-a gradient 303.7 on admission. Could be explained by aspiration now developed into pna but likely 2/2 PH as well. Somewhat volume overloaded on admission to CCU but now approaching euvolemia with tight window of fluid mgmt 2/2 restrictive physiology of  HCM.   -HFNC @ FiO2 100% and flow 50 L/min; titrate to maintain SpO2 > 88.   -C/w BiPAP at  nighttime while sleeping (uses at home)  -Off NO  -Uptravi (selexipag) 1,200 mcg po q12h  -CT chest noncon to evaluate for infection vs. Edema  -Consider changing zosyn to meropenem depending on CT  -Start low dose steroids w/prednisone 10 mg qD 12/15  -Advanced lung following, appreciate recs => discuss other tx options to enable weaning of HFNC given suspicion that PH is predominantly driving this process    #Congestion  -Flonase BID   -Nasal saline prn    #Anterior tonsillar pillar avulsion  -Likely 2/2 intubation trauma  -Exudates on b/l adenoids  -ENT evaluated, recommend conservative mgmt      Renal /Fluid, Electrolytes:   #AKI on CKD - improving  -Cr baseline 1.1  -Cr peaked @ 2.0, now down to 1.5  -Likely ATN 2/2 cardiorenal syndrome and shock  -Management of underlying cause(s) as above  -Avoid nephrotoxic agents    #Hypernatremia  -Na 148 -> 146  -CTM  -If worsens will correct with free H2O; avoid correction at this time due to c/f fluid overload     #AGMA - 2/2 lactic acidosis   #MA - 2/2 ?RTA, unclear etiology   -Na-HCO3 650 mg QID      GI:   #Nutrition  -Heart healthy diet    #PUD ppx  #GERD  -Takes protonix 40 mg po qD at home  -Pepcid 20 mg po qD     #Bowel regimen  -Pericolace 8.6-50 mg q12h       Infectious Disease (ID):   -CXR nonfocal suggestive of fluid > infiltrates  -F/u blood cx NGTD x 3D  -Throat cx negative   -Sputum cx mixed upper respiratory flora  -MRSA nares/throat negative  -Urine cx negative  -Vanc IV per pharmacy (12/11 - 12/12 ), Laramie'd given negative MRSA swab  -Zosyn 4.5 mg IV q8h (12/11 - )upgrade to meropenem if ct chest is worse        Hem/Onc:   -Eliquis for VTE ppx in setting of Afib as above      Endo:   #Hypothyroidism  -Synthroid 50 mcg qD       Prophylaxis:  - DVT: Home eliquis  - GI:  Pepcid in place of home protonix    Code Status: DNR/DNI  Dispo: Wean nitric oxide, ok to be stepped down to APU with HFNC if FiO2 < 60%    Lines/Drains/Airways:  -PIV x 2  -Foley       Signed  by: Alyson Ingles, MD, MD  Date/Time: 03/21/20 7:19 AM    [facility] Attending Attestation:    This patient has a high probability of sudden clinically significant deterioration which requires the highest level of physician preparedness to intervene urgently. I managed/supervised life or organ supporting interventions that required frequent physician assessments.   I devoted my full attention to the direct care of this patient for this period of time.      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, OSA on CPAP  QHS, apical variant hypertrophic cardiomyopathy, paroxysmal AF on Eliquis, severe PAH (possibly mixed groups 1 and 2) on home 02 5 l sats 70's 92, most recently on selexipag (though Adv HF and Adv Lung notes are unclear on whether pt has benefited from pulmonary vasodilators--and has perhaps even worsened on them), 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 Pf note, pt is a Jehovah's witness and declines blood transfusions.    Overnight  Minimal epistaxis overnight  Weaned off nitro    Neuro:  - pt wishes to be DNR/DNI  - Palliativefollowing    CV:  -  decreased home selexipag, given hypotension  - weaning iNO gradually: 20 --> 15 ppm--> plan to wean to OFF no exit startegy keep off- BP not that far from baseline (~100/60)  -  starting midodrine and antibiotics  - midodrine 10 TID   - very low cardiac output at baseline, hence upgrade from single-chamber pacemaker to dual-chamber pacemaker (in hopes synchronization would augment her CO)  - TTE w/ bubble: LVEF 55-60%, apical variant HCM, grade III diastolic dysfunction, no evidence of interatrial shunt, severe PH RVSP 85, severe TR, TAPSE 0.70, small posterior pericardial effusion  - Echo w/ bubble 09/08/17 found no evidence of right to left shunt, though AF ablation procedure in 2017 included transseptal puncture  - cautious w/ diuresis considering HCM goal -1l    Pulm:  - CXR w/ prominent vasculature and new opacities  - spot diuresis as tolerated as above  - startedabx 12/11, given productive sputum continue x7 d upgrade after ct chest review  - albuterol BID for airway clearance  - IPV vs Acapella for airway clearance  - CPAP QHS  - previously: HRCT without ILD or pulmonary fibrosis; dual energy CTA negative for clots repeat to evaluate for consolidation  - Advanced Lung following low dose steroid more aggressive nebs    ID: NAI  - now w/ productive cough; pt may have aspirated during anesthesia w/ LMA  -s/p Zosyn 12/11- consider meropenem if no improvement   - pan-culture, including Legionella Ag and RVP  - MRSA nares negative  - UA+ but UCx negative  - also w/ sore throat and erythema / pus on right posterior pharynx, perhaps secondary to trauma w/ intubation  - ENT performed rhinolaryngoscopy: anterior tonsillar pillar avulsion likely secondary to intubation  - throat culture: "nobetahemolytic Streptococcusgroup A, C, or G isolated, no further work"    Renal/Fluid, Electrolytes:  - NAGMA, unclear etiology  - bicarb tabs  - worsening AKI and hypotension 12/11: held diuresis  - Lasix 40daily since  12/12given improved hemodynamics and Cr  - s/p kayexalate x 1 for hyper-K+  - developing hypernatremia likely secondary to minimal PO secondary to sore throat, not overdiuresis    Heme:  - c/w home Eliquis (AF)  - pt is a Jehovah's witness and declines blood transfusions    GI/Nurition:feeding    Endo:  - c/w home [id number]    Lines/Tubes:   - none    PPx:  -DVT: Eliquis  -GI: famotidine home med  Code status: DNR / DNI  Any critical care time was performed today and is exclusive of teaching, billable procedures, and not overlapping with any other providers.    I have personally examined the patient and reviewed the patients history, physical exam, and additional data as recorded in the ICU team note and I agree with care plan as recorded.    Critical care time:33minutes

## 2020-03-21 NOTE — Progress Notes (Signed)
Palliative Medicine & Comprehensive   Clinical Therapist - Attempted Visit    Palliative Clinical Therapist visit attempted.     Pt preparing to be taken off unit for CT. Caregiver at bedside.     Therapist will make another attempt at another time. Business card left with caregiver.     Raynald Kemp, LCSW, ACHP-SW, APHSW-C  Clinical Therapist   Palliative Medicine & Comprehensive Care  To reach Palliative MD or NP call [phone] Or Page [phone]   To reach Clinical Therapist call [phone]

## 2020-03-21 NOTE — Progress Notes (Signed)
Palliative Medicine & Comprehensive Care  Service Phone Number: FX: [phone], Mon-Fri 9a-4p / [employer] Pager: [phone] (24/7)    Clinical Therapist - Progress Note     Situation: pt alert and oriented at time of visit. Caregiver, Denisha, at bedside.     Background: Palliative Clinical Therapist familiar with pt from previous hospitalization. Therapist explored pt's care needs and emotional status since previous hospitalization. Current level of supports explored.     Assessment: Pt reports an adequate level of support from family, caregiver, and community. She endorsed normalized feelings of anxiety related to her chronic illness and reflected back on multiple admissions since moving to [place] in early Spring. Pt generally focuses on strengths to maintain a positive outlook on her life despite medical limitations. She stated her goal to wean off HFNC and return home.     Interventions:  Evidence-Based Clinical Interventions provided during this admission? Yes - Brief solution-focused therapy   Interventions conducted during today's session:Assessment of Need, Rapport Building    Recommendations/Plan:   Pt reported no acute emotional needs at this time.    Palliative Clinical Therapist will continue to follow should any needs arise.     Raynald Kemp, LCSW, ACHP-SW, APHSW-C  Clinical Therapist   Palliative Medicine & Comprehensive Care  To reach Palliative MD/NP call [phone] Or Page [phone]   To reach Clinical Therapist call [phone]

## 2020-03-21 NOTE — Transfer Summary (Signed)
CICU TRANSFER SUMMARY    74 y.o. female with PMH of Afib, CHF 2/2 HCM, Group 1/2 PAH/PH, COPD, ILD, cardiac arrest after anesthesia who presented on 03/21/2020 after post extubation acute hypoxic respiratory failure following elective upgrade of single to dual chamber pacemaker. CICU course c/b hypotension RV failure hypoxi acute on chronic respiratory failure enecphalopathy due to hypoxia cardiogenic shock rv failure    CICU COURSE  Pt was admitted to CICU on 04/02/2020 after developing acute hypoxic respiratory failure on chronic during extubation after elective upgrade of her pacemaker from single to dual chamber. She was reintubated before being moved to the CICU. She was extubated on 12/10 and placed on HFNC w/NO. Her home selexipag Cordie Grice) was restarted but then dosage decreased from 1,600 to 1,200 mcg q12h due to hypotension. The pt's hypotension was also associated with new AKI and lactic acidosis c/f shock? Cardiogenic rv failure. She was put on midodrine 10 mg TID for pressure support to maintain MAP > 65 and to help mitigate systemic vasodilation 2/2 selexipag. A broad infectious w/u was done but only identified source tracheobronchitis ct chest neg x2. She has been on zosyn since 12/11. She was also given vancomycin but it was discontinued after one day when MRSA swab was negative. On 12/14 the pt was weaned entirely off of NO and continued on HFNC @ 50 LPM w/FiO2 80% targeting SpO2 > 88%.     To Do  -Wean HFNC as tolerated. FYI pt is on 5 L NC and saturates 70-80s at baseline so this may be difficult. Uses BIPAP at night.   - Diuresis goal -1l/day, Bumex and Diuril given  - Continue midodrine, MAP goal >65 mmHg  - Continue Eliquis for Afib  - Continue Zosyn (plan for 7-10 days) for Pneumonia    [facility] has been notified and will be following pt for the remainder of hospitalization.      Alyson Ingles, MD  Internal Medicine Intern  [facility]  March 21, 2020  17:32     For  full plan of care please refer to Progress Note from 03/22/2020.     Patient is being transferred to [facility] under [facility]. Hand-off was given to Royston Sinner, NP.     For any questions regarding this patient please call [facility] at [phone].     Betsy Coder, AGACNP-BC  [facility]

## 2020-03-22 LAB — CBC AND DIFFERENTIAL
Absolute NRBC: 0.45 10*3/uL — ABNORMAL HIGH (ref 0.00–0.00)
Basophils Absolute Automated: 0.03 10*3/uL (ref 0.00–0.08)
Basophils Automated: 0.3 %
Eosinophils Absolute Automated: 0.27 10*3/uL (ref 0.00–0.44)
Eosinophils Automated: 2.3 %
Hematocrit: 26.8 % — ABNORMAL LOW (ref 34.7–43.7)
Hgb: 8.7 g/dL — ABNORMAL LOW (ref 11.4–14.8)
Immature Granulocytes Absolute: 0.14 10*3/uL — ABNORMAL HIGH (ref 0.00–0.07)
Immature Granulocytes: 1.2 %
Lymphocytes Absolute Automated: 1.28 10*3/uL (ref 0.42–3.22)
Lymphocytes Automated: 10.9 %
MCH: 31.9 pg (ref 25.1–33.5)
MCHC: 32.5 g/dL (ref 31.5–35.8)
MCV: 98.2 fL — ABNORMAL HIGH (ref 78.0–96.0)
MPV: 12.2 fL (ref 8.9–12.5)
Monocytes Absolute Automated: 0.83 10*3/uL (ref 0.21–0.85)
Monocytes: 7.1 %
Neutrophils Absolute: 9.19 10*3/uL — ABNORMAL HIGH (ref 1.10–6.33)
Neutrophils: 78.2 %
Nucleated RBC: 3.8 /100 WBC — ABNORMAL HIGH (ref 0.0–0.0)
Platelets: 165 10*3/uL (ref 142–346)
RBC: 2.73 10*6/uL — ABNORMAL LOW (ref 3.90–5.10)
RDW: 19 % — ABNORMAL HIGH (ref 11–15)
WBC: 11.74 10*3/uL — ABNORMAL HIGH (ref 3.10–9.50)

## 2020-03-22 LAB — ECG 12-LEAD
Atrial Rate: 280 {beats}/min
P Axis: 83 degrees
Q-T Interval: 412 ms
QRS Duration: 92 ms
QTC Calculation (Bezet): 444 ms
R Axis: -40 degrees
T Axis: 137 degrees
Ventricular Rate: 70 {beats}/min

## 2020-03-22 LAB — PHOSPHORUS: Phosphorus: 4.3 mg/dL (ref 2.3–4.7)

## 2020-03-22 LAB — BASIC METABOLIC PANEL
Anion Gap: 15 (ref 5.0–15.0)
BUN: 44 mg/dL — ABNORMAL HIGH (ref 7.0–19.0)
CO2: 24 mEq/L (ref 22–29)
Calcium: 8.3 mg/dL (ref 7.9–10.2)
Chloride: 104 mEq/L (ref 100–111)
Creatinine: 1.7 mg/dL — ABNORMAL HIGH (ref 0.6–1.0)
Glucose: 94 mg/dL (ref 70–100)
Potassium: 3.4 mEq/L — ABNORMAL LOW (ref 3.5–5.1)
Sodium: 143 mEq/L (ref 136–145)

## 2020-03-22 LAB — URIC ACID: Uric acid: 7.2 mg/dL — ABNORMAL HIGH (ref 2.6–6.0)

## 2020-03-22 LAB — GFR: EGFR: 35.5

## 2020-03-22 LAB — B-TYPE NATRIURETIC PEPTIDE: B-Natriuretic Peptide: 1364 pg/mL — ABNORMAL HIGH (ref 0–100)

## 2020-03-22 LAB — MAGNESIUM: Magnesium: 2 mg/dL (ref 1.6–2.6)

## 2020-03-22 MED ORDER — ALBUTEROL SULFATE (2.5 MG/3ML) 0.083% IN NEBU
2.5000 mg | INHALATION_SOLUTION | Freq: Two times a day (BID) | RESPIRATORY_TRACT | Status: DC
Start: 2020-03-22 — End: 2020-03-23
  Administered 2020-03-22 – 2020-03-23 (×3): 2.5 mg via RESPIRATORY_TRACT
  Filled 2020-03-22 (×3): qty 3

## 2020-03-22 MED ORDER — BUMETANIDE 0.25 MG/ML IJ SOLN
4.0000 mg | Freq: Once | INTRAMUSCULAR | Status: AC
Start: 2020-03-22 — End: 2020-03-22
  Administered 2020-03-22: 15:00:00 4 mg via INTRAVENOUS
  Filled 2020-03-22: qty 16

## 2020-03-22 MED ORDER — COLCHICINE 0.6 MG PO TABS
1.2000 mg | ORAL_TABLET | Freq: Once | ORAL | Status: AC
Start: 2020-03-22 — End: 2020-03-22
  Administered 2020-03-22: 11:00:00 1.2 mg via ORAL
  Filled 2020-03-22: qty 2

## 2020-03-22 MED ORDER — CHLOROTHIAZIDE SODIUM 500 MG IV SOLR
500.0000 mg | Freq: Once | INTRAVENOUS | Status: AC
Start: 2020-03-22 — End: 2020-03-22
  Administered 2020-03-22: 15:00:00 500 mg via INTRAVENOUS
  Filled 2020-03-22: qty 500

## 2020-03-22 MED ORDER — POTASSIUM CHLORIDE 20 MEQ PO PACK
40.0000 meq | PACK | Freq: Once | ORAL | Status: DC
Start: 2020-03-22 — End: 2020-03-22
  Filled 2020-03-22: qty 2

## 2020-03-22 MED ORDER — COLCHICINE 0.6 MG PO TABS
0.6000 mg | ORAL_TABLET | Freq: Two times a day (BID) | ORAL | Status: DC
Start: 2020-03-23 — End: 2020-03-26
  Administered 2020-03-23 – 2020-03-25 (×5): 0.6 mg via ORAL
  Filled 2020-03-22 (×10): qty 1

## 2020-03-22 MED ORDER — POTASSIUM CHLORIDE CRYS ER 20 MEQ PO TBCR
40.0000 meq | EXTENDED_RELEASE_TABLET | Freq: Once | ORAL | Status: AC
Start: 2020-03-22 — End: 2020-03-22
  Administered 2020-03-22: 08:00:00 40 meq via ORAL
  Filled 2020-03-22: qty 2

## 2020-03-22 MED ORDER — PREDNISONE 20 MG PO TABS
20.0000 mg | ORAL_TABLET | Freq: Every morning | ORAL | Status: DC
Start: 2020-03-23 — End: 2020-03-23
  Administered 2020-03-23: 09:00:00 20 mg via ORAL
  Filled 2020-03-22: qty 1

## 2020-03-22 MED ORDER — COLCHICINE 0.6 MG PO TABS
0.6000 mg | ORAL_TABLET | Freq: Once | ORAL | Status: AC
Start: 2020-03-22 — End: 2020-03-22
  Administered 2020-03-22: 13:00:00 0.6 mg via ORAL
  Filled 2020-03-22: qty 1

## 2020-03-22 MED ORDER — BUMETANIDE 0.25 MG/ML IJ SOLN
3.0000 mg | Freq: Once | INTRAMUSCULAR | Status: AC
Start: 2020-03-22 — End: 2020-03-22
  Administered 2020-03-22: 3 mg via INTRAVENOUS
  Filled 2020-03-22: qty 12

## 2020-03-22 NOTE — Plan of Care (Signed)
ICU Checklist      Ventilator Bundle    Patient on ventilator: No       Line Bundle  Central line present: No   Date inserted: n/a  Type: n/a  Location: n/a  Can central line be removed?: Not Applicable  PIV x 3    Foley Bundle  Foley catheter present?: No   Date placed:  Can urinary catheter be removed: Not Applicable    General Care Bundle  Restraints Renewed?: no  Fluid balance achieved?: No  DVT Prophylaxis ordered?: Yes  If no, choose one of the following:   [] ??Active Bleed [] ??Coagulopathy [] ??Thrombocytopenia [] ??Other:   Nutrition ordered?: yes  If no, choose one of the following:  [] ??NPO [] ??Aspiration risk [] ??Ileus [] ??Hemodynamically instable [] ??Other:   Bowel movement in last 48 hours?: Yes  If no, bowel regimen in place?: Yes  PUD prophylaxis: Yes  Mobility protocol: PT/OT ordered?Yes

## 2020-03-22 NOTE — Progress Notes (Signed)
0700H Assumed care. A/O x 4. Afebrile. Transfers to chair. Complained of left foot pain 3/10, feet feeling tight, and knee pain. Started colchicine. Complained of chest pain 3/10, crushing, continuous. On 100% FiO2, EKG, done. Resident informed. Monitored patient as verbalized.   Vpaced in the 70s. SBP~90s-110s.   On HFNC 70-100% FiO2 depending on activity. Desats with coughing. Minimal secretions. Dry drainage from left nares, out, no bleeding. Replaced K with 40 mEq tab. Diuresed with Bumex x 2 and diuril x 1. I&O - . 2x loose BM. Fall precaution maintained. Continue plan of care.
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
## 2020-03-22 NOTE — Progress Notes (Signed)
Very anxious changing from HFNC to bipap- states its not enough air. Increased PS 12. Will wean as able

## 2020-03-22 NOTE — Progress Notes (Signed)
Patient  Shown desaturation fast. Resume PS 12/6+ and 70%. Will wean o2 as able. NARD

## 2020-03-22 NOTE — PT Progress Note (Signed)
Physical Therapy Note    [facility]   Physical Therapy Treatment  Patient:  [name] MRN#:  [id number]  Unit: [facility] CICU  Bed: FI102/FI102-01      Post Acute Care Therapy Recommendations:   Discharge Recommendations: SNF     Milestones to be reached to achieve recommendation: none    DME Recommended for Discharge:  (see alternative recs)    If SNF recommended discharge disposition is not available, patient will need hands on assist for mobility and ADL, stretcher transport into home, increased assist from HHA, and HHPT.    Therapy discharge recommendations may change with patient status.  Please refer to most recent note for up-to-date recommendations.Thank you.    Assessment:   Significant Findings: SpO2 dropped to 79-83% following bed > BSC transfer. See vitals section below in chart of further details. RN made aware.    Pt received seated at EOB. Nurse and pt agreeable to skilled PT. Pt completed sit <> stand with mod A. She required modA/min A for household transfers; min A while ambulating 54ft x2 during transfers using no AD. Pt demonstrating unsteadiness and decreased stability 2/2 B knee pain. Further standing and ambulation limited by SOB and drop in SpO2 (see vitals section below in chart of further details). At end of PT session, pt seated in bedside chair and reviewed seated TherEx with pt. Pt cued on PLB throughout PT session and educated her on energy conservation techniques. Pt would continue to benefit from skilled PT to maximize functional mobility and independence.      Assessment: Decreased UE strength,Decreased LE strength,Decreased safety/judgement during functional mobility,Decreased endurance/activity tolerance,Decreased functional mobility,Decreased balance,Gait impairment  Progress: Slow progress, decreased activity tolerance  Prognosis: Good,With continued PT status post acute discharge  Risks/Benefits/POC Discussed with Pt/Family: With  patient  Patient left without needs and call bell within reach. RN notified of session outcome.     Treatment Activities: TherEx, TherAct,endurance training, gait training.    Educated the patient to role of physical therapy, plan of care, goals of therapy and HEP, safety with mobility and ADLs, energy conservation techniques, pursed lip breathing.      Plan:   Treatment/Interventions: Exercise,Gait training,Stair training,Functional transfer training,LE strengthening/ROM,Endurance training,Patient/family training,Equipment eval/education,Bed mobility        PT Frequency: 2-3x/wk     Continue plan of care.       Precautions and Contraindications:   Precautions  Other Precautions: falls, HFNC, monitor O2    Updated Medical Status/Imaging/Labs:  No new results found. Reviewed previous imaging.     Lab Results   Component Value Date/Time    HGB 8.7 (L) 03/22/2020 04:34 AM    HCT 26.8 (L) 03/22/2020 04:34 AM    K 3.4 (L) 03/22/2020 04:34 AM    NA 143 03/22/2020 04:34 AM     Subjective:   "They didn't realize I have gout, so I wasn't getting that medication."    Patient Goal: to get better    Pain Assessment  Pain Assessment: Wong-Baker FACES  Wong-Baker FACES Pain Rating: Hurts little more  POSS Score: Awake and Alert  Pain Location: Knee;Foot (B knees and L heel 2/2 "gout")  Pain Frequency: Increases with movement  Pain Intervention(s): Rest;Repositioned    Patient's medical condition is appropriate for Physical Therapy intervention at this time.  Patient is agreeable to participation in the therapy session. Nursing clears patient for therapy.    Objective:   Observation of Patient/Vital Signs:  Patient is out of bed, ambulating, seated in a bedside chair and seated at edge of bed with telemetry, peripheral IV, High flow nasal cannula and catheter in place.  Pt wore mask during therapy session: no    Vital Signs:  SpO2 at start of PT session, seated at EOB on 50L/80%: 89-91%  SpO2 following transfer to the [facility]: dropped  to 79-83% - recovered to 90% within 2-3 minute seated rest break + increase in oxygen to 50L/100% (completed by RT) + PLB.   SpO2 following transfer to the chair on 50L/100%: dropped to 88-89% - recovered to 90% within 90 seconds seated rest break + PLB.   SpO2 at start of PT session, seated at EOB on 50L/100%: 93%    Cognition/Neuro Status  Arousal/Alertness: Appropriate responses to stimuli  Attention Span: Appears intact  Memory: Appears intact  Following Commands: Follows all commands and directions without difficulty  Safety Awareness: minimal verbal instruction  Insights: Educated in Engineer, building services  Orientation Level: Oriented X4    Functional Mobility:  Sit to Stand: Moderate Assist;with instruction for hand placement to increase safety  Stand to Sit: Moderate Assist  Transfers  Bed to Chair: Minimal Assist;Moderate Assist (bed > BSC > chair)    Ambulation:  PMP - Progressive Mobility Protocol   PMP Activity: Step 5 - Chair;Step 6 - Walks in Room  Distance Walked (ft) (Step 6,7): 2 Feet (34ft x2)    Ambulation: Minimal Assist (60ft x2 during transfers)  Pattern: decreased step length;decreased cadence;R foot decreased clearance;L foot decreased clearance  Therapeutic Exercise  Strengthening: patient education (reviewed )  Neuro Re-Ed  Sitting Balance: supervision  Standing Balance: minimal assist       Patient Participation: good  Patient Endurance: fair+    Patient left with call bell within reach, all needs met, SCDs off, fall mat in place, bed alarm n/a, chair alarm on and all questions answered. RN notified of session outcome and patient response.     Goals:  Goals  Goal Formulation: With patient  Time for Goal Acheivement: 7 visits  Goals: Select goal  Pt Will Go Supine To Sit: with supervision  Pt Will Perform Sit to Stand: with supervision  Pt Will Transfer Bed/Chair: with supervision  Pt Will Ambulate: 51-100 feet,with supervision (using LRAD)  Pt Will Go Up / Down Stairs: 3-5 stairs,with stand by  assist,With rail      Time of Treatment  PT Received On: 03/22/20  Start Time: 1017  Stop Time: 1045  Time Calculation (min): 28 min  Treatment # 2 out of 7 visits    PPE worn during session: procedural mask, goggles and gloves  Tech present: no  PPE worn by tech: N/A    Terie Purser  DPT, PT  [phone]

## 2020-03-22 NOTE — Progress Notes (Signed)
Patient's Name: [name] [name]   Admit Date: Apr 01, 2020  Medical Record Number: [id number]   Code Status: No CPR with SUP  Room: FI102/FI102-01  Date/Time: 03/22/20 11:02 AM  Cardiologist of record: IMG    24 Hour Events:    - CT chest demonstrated volume congestion.  - Provided Torsemide 40 mg PO x1 but had minimal UOP so spot dosed Lasix 80 mg IV x1.  - Initiated on prednisone 10 mg PO daily per advanced lung recommendations.  - Remains off nitric oxide.  - Lactate fluctuating up and down but otherwise hemodynamically stable.  - HFNC was maintained at 100% FiO2. Placed on NIPPV overnight.     IMPRESSIONS:      Presented for electivechange of single-chamber pacemaker to dual-chamber pacemaker requiring elective intubation for the procedure. Post extubation developed hypoxic respiratory failure and was re-intubated; continued to be hypoxic (mostly related to hx of severe PH)even on 100% FiO2, improved with Veletri).   Brief hypotension requiring pressor support, currently off pressors.   Severe pulmonary hypertension (Group 2 +/- 1, RVSP 85 mmHg)   RHC (06/26/16): RA 4, PA 69/23/38, PCWP 8, TPG 30, Fick CO 4.15 and PVR 7.3, thermal CO 3.14 and PVR 9.6.   Treated with macitentan previously.She did not tolerate tadalafil, selexipag was subsequently added.A trial of riociguat was stopped due to hypotension.    RHC 01/24/20: RA 10, RV 79/5, PA 83/38/53, PCWP 14.    With exercise her PCWP increased to 21 mmHg. She has low output state predominantly driven by her severe pulmonary hypertension   Apical hypertrophic cardiomyopathy with apical aneurysm   TTE (12/10): LVEF 55-60%, hypertrophic cardiomypathy, LA moderately dilated, no evidence of shunt, severe TR, severe PH RVSP 85 mmHg, grade III diastolic dysfunction.   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    Rest of care per [facility] team    Transfer to step down unit   Discussed with team, [facility], RN, patient    Signed: Viann Shove, MD  PGY-4    Date/Time: 03/22/20 11:02 AM  ---------------------------------------------------------------------------  CICU Cardiology Attending addendum:    Patient seen/examined with cardiology fellow [name].   Exam notable for RRR, normal S1-S2, clear lungs.   24 hour events: On 50% O2  My assessment/plan as noted above with the following additions:    Impressions:  74 year old female with apical hypertrophic cardiomyopathy with apical aneurysm, severe group 2 pulmonary hypertension, possible ILD, PAF s/p prior ablation/PVI isolation, prior GI bleed, Jehovah's Witness, OSA, admitted to the CICU for hypoxic respiratory failure requiring intubation in the setting of a dual-chamber pacemaker upgrade. CICU course notable for subsequent  extubation.    Recommendations:   Continue midodrine   Serum BNP 1324 (up from 186 on 12/10), and chest CT suggests some increased pulmonary edema, bumex 3mg  IV given this morning, goal for today of 1-2L net negative. Redose bumex this afternoon.   Continue selexipag   Continue apixaban for PAF   Okay for transfer out of the CICU pending bed availability, awaiting [facility] bed    Patient independently examined by me. Pertinent medical records,  studies, labs, imaging studies independently reviewed by me. Treatment of an organ system (cardiac) with high probability of life-threatening deterioration and need for life-saving interventions. Critical care time spent excluding procedures/teaching 30 minutes from 9AM to 9:30AM.     [name]. Diane Shearer, MD [facility]  CICU Cardiology Attending  Spectralink [phone]    ----------------------------    History: [name] is a 74 y.o. female. She has a past medical history of pAFib (On AC) s/p prior CTI ablation/PV isolation and AV junction ablation/implantation of VVI pacemaker, heart failure with preserved ejection fraction due to Apical hypertrophic cardiomyopathy with apical aneurysm, CKD, COPD, COVID-19 (06/2019), disorder of thyroid, gastrointestinal hemorrhage, severe pulmonary hypertension (On oxygen by nasal cannula), OSA on CPAP (CPAP since 2015).    She presented on 03/13/2020 for elective change of single-chamber pacemaker to dual-chamber pacemaker. Per anesthesia reports, patient required intubation under LMA sedation. After extubation she developed hypoxemia and respiratory distress and had to be reintubated.[facility] consulted admitted to CICU for post procedure respiratory failure and hypoxemia and management.    On arrival to the CICU she was very hypoxemic with saturation down to 70s on 100% FiO2 and PEEP of 8. Stat chest x-ray did not show pneumothorax.ET tube in appropriate position. Stat sonographic examination of heart did not show any pericardial tamponade. LV function at baseline.    Objective:    Physical Exam:    Vital signs in last 24 hours:     Temp:  [97.8 F (36.6 C)-99.1 F (37.3 C)] 97.8 F (36.6 C)  Heart Rate:  [69-72] 69  Resp Rate:  [19-43] 43  BP: (92-127)/(56-65) 105/59  FiO2:  [50 %-100 %] 70 %  SpO2: 92 %   O2 Device: HFNC  Height: 161.3 cm (5' 3.5")    Intake/Output:    Intake/Output Summary (Last 24 hours) at 03/22/2020 1102  Last data filed at 03/22/2020 0900  Gross  per 24 hour   Intake 1348 ml   Output 1095 ml   Net 253 ml     Net IO Since Admission: -3,732.46 mL [03/22/20 1102]    Weight:     Wt Readings from Last 4 Encounters:   03/22/20 85.5 kg (188 lb 7.9 oz)   03/07/20 82.1 kg (181 lb)   03/02/20 82.8 kg (182 lb 9.6 oz)   02/22/20 82.6 kg (182 lb)     Vitals:    03/22/20 1000   BP: 105/59   Pulse: 69   Resp: (!) 43   Temp:    SpO2: 92%     General: HFNC, yet awake  Neck: Trachea midline, JVD   Pulmonary: Normal respiratory effort, clear lung sounds bilaterally.  Cardiovascular: PMI nondisplaced with normal rate and rhythm, normal S1, normal S2, no murmur, rub, or gallop appreciated.  Abdomen: Soft, nontender.  Extremities: No edema, no clubbing, no cyanosis.    Laboratory:      Basic Metabolic Panel:         03/22/20  0434 03/21/20  0414 03/20/20  7829   Creatinine 1.7* 1.5* 1.4*   BUN 44.0* 39.0* 34.0*   CO2 24 24 21*   Sodium 143 146* 148*   Potassium 3.4* 3.9 3.9   Magnesium 2.0 1.9 2.0   Phosphorus 4.3 4.2 3.9   Chloride 104 108 110   Calcium 8.3 8.6 8.8     Liver Function Test:        03/20/20  0347   AST (SGOT) 24   ALT 22   Bilirubin, Total 1.1     Complete Blood Count:        03/22/20  0434 03/21/20  0414 03/20/20  0347   WBC 11.74* 11.61* 11.49*   Hgb 8.7* 9.0* 9.7*   Hematocrit 26.8* 28.0* 30.7*   Platelets 165 180 210     Cardiac Markers:        03/22/20  0434 03/16/20  1806   B-Natriuretic Peptide 1,364* 186*     Imaging:   Radiology Results (24 Hour)     Procedure Component Value Units Date/Time    CT Chest WO Contrast [[id number]] Collected: 03/21/20 1129    Order Status: Completed Updated: 03/21/20 1144    Narrative:      HISTORY: Leukocytosis.    TECHNIQUE: Noncontrast Chest CT. The following dose reduction techniques  were utilized: Automated exposure control and/or adjustment of the mA  and/or kV according to patient size, and the use of iterative  reconstruction technique.    COMPARISON: 02/02/2020.    FINDINGS: There is mild diffuse smooth interstitial  thickening and  diffuse mild groundglass haziness favoring congestion. A new 5 mm  nodular subpleural nodule is present in the lateral right lower lobe. No  dense pneumonia, cavitation or pneumothorax is present. The pulmonary  arteries remain enlarged. The thoracic aorta is normal caliber. Mild  mediastinal adenopathy is unchanged with no axillary or hilar  adenopathy. The heart is enlarged with trace pericardial trace pleural  effusions.      Impression:        1. New smooth interstitial thickening, groundglass and trace effusions  favoring congestion. 4 mm subpleural right lower lobe nodule needs no  additional follow-up in the absence of known malignancy or significant  risk factors.  2. Stable mediastinal adenopathy.    Bosie Helper, MD   03/21/2020 11:42 AM        Cardiographics:     Echocardiogram (03/16/2020):       * Left ventricular systolic function is normal with an ejection fraction of 55 to 60%.    * Increased left ventricular wall thickness towards the apex,morphology suggestive of apical variant hypertrophic cardiomyopathy.    * Decreased right ventricular systolic function.    * The left atrium is moderately dilated.    * The right atrium is dilated.    * No evidence of interatrial shunt by color Doppler or agitated saline (with and without provocative maneuver).    * There is severe tricuspid regurgitation.    * Severe pulmonary hypertension with estimated right ventricular systolic pressure of  85 mmHg.    * The IVC is dilated with < 50% respiratory variance consistent with significantly elevated RA pressure of 15 mmHg.    * Small posterior pericardial effusion visualized.    * Compared to the prior study on 02/02/2020,  TR is worse.    Inpatient Medications:    Scheduled:      albuterol-ipratropium, 3 mL, Nebulization, BID    apixaban, 5 mg, Oral, Q12H [facility]  famotidine, 20 mg, Oral, Daily    fluticasone, 1 spray, Each Nare, Daily    levothyroxine, 50 mcg, Oral, Daily at 0600    midodrine,  10 mg, Oral, TID MEALS    oxymetazoline, 2 spray, Each Nare, Q12H [facility]    piperacillin-tazobactam, 4.5 g, Intravenous, Q8H    predniSONE, 10 mg, Oral, QAM W/BREAKFAST    selexipag, 1,200 mcg, Oral, Q12H [facility]    senna-docusate, 1 tablet, Oral, Q12H [facility]    sodium bicarbonate, 650 mg, Oral, QID    Infusions:      Home Medications     Med List Status: Complete Set By: Tania Ade, RN at 04/05/2020  1:48 PM                allopurinol (ZYLOPRIM) 100 MG tablet     Take 1 tablet (100 mg total) by mouth daily Per pt taking 50mg  (half tab)     Notes:  Take morning of procedure per MD     apixaban (ELIQUIS) 5 MG     Take 1 tablet (5 mg total) by mouth every 12 (twelve) hours     Notes:  Last dosage 03/11/2020 p.m per MD     atorvastatin (LIPITOR) 80 MG tablet     Take 80 mg by mouth every evening        colchicine 0.6 MG tablet     Take 1 tablet (0.6 mg total) by mouth daily     Patient taking differently: Take 0.6 mg by mouth every morning        Notes:  Take morning of procedure per MD     ezetimibe (ZETIA) 10 MG tablet     Take 10 mg by mouth nightly        ferrous sulfate 325 (65 FE) MG tablet     Take 325 mg by mouth Once every Monday, Wednesday and Friday morning Taking on Monday wed, friday / severe GI bleed 11/2019       Notes:  Hold morning of procedure per guidelines         furosemide (LASIX) 40 MG tablet     Take 1 tablet (40 mg total) by mouth 2 (two) times daily     Patient taking differently: Take 40 mg by mouth every morning        Notes:  Hold morning of procedure per MD       levothyroxine (SYNTHROID) 50 MCG tablet     Take 50 mcg by mouth Once a day at 6:00am     Notes:  Take morning of procedure per MD     Multiple Vitamin (multivitamin) capsule     Take 1 capsule by mouth daily     Notes:  Hold 7 days prior to procedure per guidlines     OXYGEN-HELIUM IN     Inhale 4-6 L/min into the lungs        pantoprazole (PROTONIX) 40 MG tablet     Take 40 mg by mouth nightly        potassium chloride (K-DUR) 10  MEQ tablet     Take 10 mEq by mouth daily        Notes:  Hold morning of procedure per guidelines     Selexipag (Uptravi) 1600 MCG Tab     Take 1,600 mcg by mouth 2 (two) times daily     Patient taking differently: Take 1,600 mcg by mouth 2 (two) times daily        Notes:  Take morning of procedure per MD     spironolactone (ALDACTONE) 25 MG tablet     Take 1 tablet (25 mg total) by mouth 2 (two) times daily     Patient taking differently: Take 25 mg by mouth daily        Notes:  Hold morning of procedure per MD     vitamin D (CHOLECALCIFEROL) 25 MCG (1000 UT) tablet     Take 2,000 Units by mouth daily     Notes:  Hold 7 days prior to procedure per guidlines

## 2020-03-22 NOTE — Progress Notes (Signed)
[facility] Palliative Medicine & Comprehensive Care   Service Phone Number: FX: [phone], Mon-Fri 9a-4p / Xtend Pager: #[phone] (24/7)     Palliative Care Progress Note   Date Time: 03/22/20 11:10 AM   Patient Name: [name], [name]   Location: FI102/FI102-01   Attending Physician: Orpah Clinton, MD   Primary Care Physician: Georgeanna Lea, MD   Consulting Provider: Namon Cirri, MD   Consulting Service: Palliative Medicine and Comprehensive Care  Consulted request from Orpah Clinton, MD to see patient regarding:   Reason for Referral: Clarify goals of care; Symptom (dyspnea, nausea, anxiety, fatigue, etc.)          Assessment & Plan   Impression   [name] 74 y.o. female with PAHon home O2, PAF s/p ablation on eliquis, dCHF and hypertrophic cardiomyopathy and question of ILD as well asreports if auto-immune lung condition who presented for an elective single-to-dual champer ICD upgrade whose course was c/b hypotension/hypoxia, now extubated POD1 but remains on HFNC. Palliative medicine consulted for goals of care          Estimated Prognosis: Weeks to months         Recommendations   1. Goals of Care:   - "I want to go home and live"  - "I am realistic and know that I am very sick and have limited options"  - requests open/honest updates as to how she is doing and how realistic her goal of getting home is  - Declines further attempts at intubation  - declines CPR  - Requests completion of Advanced directive, done today  - POST completed  - not interested in home hospice, but is interested in home palliative  - referral for home palliative care order placed  - she would like to donate her body to a medical school for research.  ---- she has asked [name] to reach out to [facility] and [facility] -- both not accepting donations  ---- will connect with [facility] today  ---- if [facility] unable, [employer] and [place] able to accept donations    Barriers to her primary goal of care to get  home  - needs to wean off iNO - successfully done  - needs to wean down oxygen requirement - remains on 80% FiO2 (up to 100% with PT)      Code Status:       - NO CPR - SUPPORT OK      Advance Care Planning:  ACP Validation: Valid advanced care planning documents completed during admission and Valid durable DNR/POST document completed during admission  Medical Decision Maker: Advance directives: Health Care Agent: [name] [phone] (Relationship: friend)  ACP Document: HCPOA and DDNR/POST     2. Psychosocial: pallitive medicine LCSW following    3. Spiritual:   - Religion: Jehovah's Witness.   - Chaplain available for support as needed    4. PC Team follow-up plans: tomorrow      Discharge Disposition: Home      Outpatient Follow Up Recommended: Yes    Outcomes: Family meetings, Clarified goals of care, Provided advance care planning, Completed durable DNR, ACP counseling assistance and POST completion  25 minutes.  Total time on unit today in care of [facility] Best   including chart review, face to face evaluation and management. >50% of time spent in counseling & coordination of care with Patient, Family, Consultants and referring provider with recommendations above.       Start Time:  1030                 Stop Time:   1050  Start time: 1110                    Stop time: 1115      Namon Cirri, MD, MD  Palliative Medicine & Comprehensive Care  Phone Number: [phone], Mon-Fri 9a-4p   Xtend Pager: [phone] (24/7)     Interval History   [name] is a 74 y.o. female admitted to hospital on 03-16-20 with Encounter for pacemaker at end of battery life [Z45.010]  Pacemaker generator end of life [Z45.010]     Patient seen at bedside and discussed with primary team    No major issues, reports that her breathing is about the same. Shares that shes been in contact with a life insurance company for a policy that she has purchased and made arrangements for cremation that she is hoping to  now cancel as she would like to donate her body to a medical school for education purposes    She reports that she has no pain, nausea or problems with her appetite.  She reports that her nose bleeding continues to improve.       Goals of Care   CURRENT CPR Status: NO CPR - SUPPORT OK          Advanced Care Planning:      Decisional Capacity: yes      Advance Directives have been completed in the past: no      Advance Directives are available in chart: yes      Discussed this admission: yes      ACP note completed: yes       Date: 03/19/20      GOC Discussion: "I want to go home to live, but I understand that I am very sick and my options are limited" - declines hospice informational visit at this time  - would like to donate her body to a medical school for research/education      Outcome of Discussion: treatment/curative pathway        Palliative Functional and Symptom Assessment     Palliative Performance Scale: 70% - Reduced ambulation, unable to do normal work, some evidence of disease, full self-care, normal or reduced intake, full LOC    [place] Symptom Assessment Scale (ESAS): Completed  Anxiety: 0  Depression: 0  Drowsiness: 0  Lack of Appetite: 2  Nausea: 0  Pain: 0  Shortness of Breath: 4  Tiredness: 4  Well-being: 4     Medications   Scheduled Meds  Current Facility-Administered Medications   Medication Dose Route Frequency    albuterol-ipratropium  3 mL Nebulization BID    apixaban  5 mg Oral Q12H SCH    colchicine  1.2 mg Oral Once    Followed by    colchicine  0.6 mg Oral Once    [START ON 03/23/2020] colchicine  0.6 mg Oral BID    famotidine  20 mg Oral Daily    fluticasone  1 spray Each Nare Daily    levothyroxine  50 mcg Oral Daily at 0600    midodrine  10 mg Oral TID MEALS    oxymetazoline  2 spray Each Nare Q12H SCH    piperacillin-tazobactam  4.5 g Intravenous Q8H    predniSONE  10 mg Oral QAM W/BREAKFAST    selexipag  1,200 mcg Oral Q12H [facility]  senna-docusate  1 tablet Oral  Q12H SCH    sodium bicarbonate  650 mg Oral QID      DRIPS     PRN MEDS  Current Facility-Administered Medications   Medication Dose    acetaminophen  650 mg    benzocaine-menthol  1 lozenge    calcium carbonate  500 mg    lidocaine viscous  5 mL    ondansetron  4 mg    phenol  1 spray    saline      saline  1 spray       Allergies   Allergies   Allergen Reactions    Amiodarone Other (See Comments)     Lung toxicity    Cellcept [Mycophenolate]      Gastro hemorrhages     Biopatch Protective Disk-Chg [Chlorhexidine] Itching    Hibiclens [Chlorhexidine Gluconate] Itching     "Severe itiching"    Zyban [Bupropion] Itching       Physical Exam   BP 105/59    Pulse 69    Temp 97.8 F (36.6 C) (Oral)    Resp (!) 43    Ht 1.613 m (5' 3.5")    Wt 85.5 kg (188 lb 7.9 oz)    SpO2 92%    BMI 32.86 kg/m    Physical Exam:  General: well developed, chronically ill appearing woman in NAD   HEENT:  EOMI, anicteric, HFNC in place, no epistaxis, MMM  Neck: supple   CV: irreg rate  Lungs: tachypneic, on HFNC, mild crackles at R base   Abd: soft, ND, NABS  Ext: Trace pedal edema  Neuro: awake, alert, oriented x 3, no focal deficits  Psych:  appropriate insight and judgement, mood and affect congruent to current situation  Skin: no rashes or lesions noted      Labs / Radiology   Lab and diagnostics: reviewed in Epic  Recent Labs   Lab 03/22/20  0434   WBC 11.74*   Hgb 8.7*   Hematocrit 26.8*   Platelets 165              Recent Labs   Lab 03/22/20  0434   Sodium 143   Potassium 3.4*   Chloride 104   CO2 24   BUN 44.0*   Creatinine 1.7*   EGFR 35.5   Glucose 94   Calcium 8.3     Recent Labs   Lab 03/20/20  0347   Bilirubin, Total 1.1   Bilirubin Direct 0.4   Protein, Total 6.3   Albumin 2.7*   ALT 22   AST (SGOT) 24          CT Head WO Contrast    Result Date: 03/16/2020   CT scan of the head shows no hydrocephalus, herniation, or intracranial hemorrhage. There is no visible acute intracranial abnormality. Miguel Dibble, MD   03/16/2020 5:11 AM    CT Chest WO Contrast    Result Date: 03/21/2020  1. New smooth interstitial thickening, groundglass and trace effusions favoring congestion. 4 mm subpleural right lower lobe nodule needs no additional follow-up in the absence of known malignancy or significant risk factors. 2. Stable mediastinal adenopathy. Bosie Helper, MD  03/21/2020 11:42 AM    XR Chest AP Portable    Result Date: 03/21/2020   Persistent, potentially increasing moderate cardiomegaly and/or pericardial effusion. Unchanged moderate diffuse bilateral interstitial lung opacities. Remainder as above. Wilmon Pali, MD  03/21/2020 7:19 AM    XR Chest AP Portable  Result Date: 03/20/2020   Slightly decreasing vascular congestion and interstitial edema. Remainder as above. Wilmon Pali, MD  03/20/2020 7:47 AM    XR Chest AP Portable    Result Date: 03/19/2020  Findings of pulmonary edema, bilateral pleural effusions and multifocal airspace opacity. Colonel Bald, MD  03/19/2020 8:53 AM    XR Chest AP Portable    Result Date: 03/18/2020    CHF Gerlene Burdock, MD  03/18/2020 8:17 AM    XR Chest AP Portable    Result Date: 03/17/2020  . Improved edema post extubation Marty Heck, MD  03/17/2020 12:19 PM    XR Chest AP Portable    Result Date: Mar 18, 2020  1. Cardiac enlargement and increased vascular congestion 2. Bibasilar opacities and increased lingular opacities, likely atelectasis Genelle Bal  2020-03-18 8:17 PM

## 2020-03-22 NOTE — Progress Notes (Signed)
Decreased ipap to 9

## 2020-03-22 NOTE — Progress Notes (Signed)
ICU/CCU Daily Progress Note    Patient's Name: [name]    Room:  ZO109/UE454-09  Attending Provider: Orpah Clinton, MD  Admit Date:03/31/2020  Medical Record Number: [id number]     Date/Time: 03/22/20 7:46 AM    74 year old female w/ hx afib, CHF, group 1 or 2 PAH, HCM, COPD, ILD, cardiac arrest after anesthesia presents with profound acute hypoxic respiratory failure after extubation following elective upgrade of single chamber pacemaker to dual chamber pacemaker.      24hr events:  NAEON    Pt seen at bedside. Complains of worsening of L toe and ankle pain which started yesterday but now has worsened feeling of tightness and swelling. No CP, changes in SOB, F/C.      VITAL SIGNS PHYSICAL EXAM   Temp:  [98.1 F (36.7 C)-99.1 F (37.3 C)] 98.6 F (37 C)  Heart Rate:  [69-72] 69  Resp Rate:  [19-38] 19  BP: (90-127)/(54-65) 92/59  FiO2:  [50 %-100 %] 65 %   Pulse ox: 86-98%  MAP 70-76    Blood Glucose: 94    Telemetry: NSR    Vent Settings  Vent Mode: PS/CPAP in NIV  FiO2: 65 %  Resp Rate (Set): 0  Vt (Set, mL): 0 mL  PIP Observed (cm H2O): 8 cm H2O  PEEP/EPAP: 5 cm H20  Pressure Support / IPAP: 12 cmH20  Mean Airway Pressure: 6 cmH20    Intake/Output Summary (Last 24 hours) at 03/22/2020 0726  Last data filed at 03/22/2020 0600  Gross per 24 hour   Intake 1410 ml   Output 1070 ml   Net 340 ml    Physical Exam  General: NAD  Neuro: A&O x 4  Cardiac: regular rate and rhythm, no murmurs, rubs, or gallops   Lungs: B/l rales, no rhonchi or wheeze  Abdomen: soft, nontender, nondistended  Ext: warm, dry, trace LE edema          Scheduled Meds: PRN Meds:    albuterol-ipratropium, 3 mL, Nebulization, BID  apixaban, 5 mg, Oral, Q12H SCH  bumetanide, 3 mg, Intravenous, Once  famotidine, 20 mg, Oral, Daily  fluticasone, 1 spray, Each Nare, Daily  levothyroxine, 50 mcg, Oral, Daily at 0600  midodrine, 10 mg, Oral, TID MEALS  oxymetazoline, 2 spray, Each Nare, Q12H SCH  piperacillin-tazobactam, 4.5 g,  Intravenous, Q8H  potassium chloride, 40 mEq, Oral, Once  predniSONE, 10 mg, Oral, QAM W/BREAKFAST  selexipag, 1,200 mcg, Oral, Q12H SCH  senna-docusate, 1 tablet, Oral, Q12H SCH  sodium bicarbonate, 650 mg, Oral, QID        Continuous Infusions:   acetaminophen, 650 mg, Q8H PRN  benzocaine-menthol, 1 lozenge, Q1H PRN  calcium carbonate, 500 mg, 4X Daily PRN  lidocaine viscous, 5 mL, Q6H PRN  ondansetron, 4 mg, Q8H PRN  phenol, 1 spray, Q2H PRN  saline, , PRN  saline, 1 spray, PRN            Labs (last 72 hours):  Recent Labs     03/22/20  0434 03/21/20  0414 03/20/20  0347   WBC 11.74* 11.61* 11.49*   Hgb 8.7* 9.0* 9.7*   Hematocrit 26.8* 28.0* 30.7*   Plt 180    No results for input(s): PT, INR, PTT in the last 72 hours.       BNP 1,364 (186)   Recent Labs     03/22/20  0434 03/21/20  0414 03/20/20  0347   Sodium 143 146* 148*  Potassium 3.4* 3.9 3.9   Chloride 104 108 110   CO2 24 24 21*   BUN 44.0* 39.0* 34.0*   Creatinine 1.7* 1.5* 1.4*   Glucose 94 103* 123*   Calcium 8.3 8.6 8.8   Magnesium 2.0 1.9 2.0   Phosphorus 4.3 4.2 3.9          Microbiology:   12/13 Legionella Urine Ag negative   12/11 Sputum stain/cx: mod mixed respiratory flora  12/11 MRSA nares/throat negative   12/11 Urine cx negative   12/11 Throat cx negative   12/11 Blood cx NGTD x 3d    Imaging:  CT Head WO Contrast    Result Date: 03/16/2020   CT scan of the head shows no hydrocephalus, herniation, or intracranial hemorrhage. There is no visible acute intracranial abnormality. Miguel Dibble, MD  03/16/2020 5:11 AM    CT Chest WO Contrast    Result Date: 03/21/2020  1. New smooth interstitial thickening, groundglass and trace effusions favoring congestion. 4 mm subpleural right lower lobe nodule needs no additional follow-up in the absence of known malignancy or significant risk factors. 2. Stable mediastinal adenopathy. Bosie Helper, MD  03/21/2020 11:42 AM    XR Chest AP Portable    Result Date: 03/21/2020   Persistent, potentially increasing  moderate cardiomegaly and/or pericardial effusion. Unchanged moderate diffuse bilateral interstitial lung opacities. Remainder as above. Wilmon Pali, MD  03/21/2020 7:19 AM    XR Chest AP Portable    Result Date: 03/20/2020   Slightly decreasing vascular congestion and interstitial edema. Remainder as above. Wilmon Pali, MD  03/20/2020 7:47 AM    XR Chest AP Portable    Result Date: 03/19/2020  Findings of pulmonary edema, bilateral pleural effusions and multifocal airspace opacity. Colonel Bald, MD  03/19/2020 8:53 AM    XR Chest AP Portable    Result Date: 03/18/2020    CHF Gerlene Burdock, MD  03/18/2020 8:17 AM    XR Chest AP Portable    Result Date: 03/17/2020  . Improved edema post extubation Marty Heck, MD  03/17/2020 12:19 PM    XR Chest AP Portable    Result Date: March 17, 2020  1. Cardiac enlargement and increased vascular congestion 2. Bibasilar opacities and increased lingular opacities, likely atelectasis Genelle Bal  2020/03/17 8:17 PM      TTE 12/10    * Left ventricular systolic function is normal with an ejection fraction of  55 to 60%.    * Increased left ventricular wall thickness towards the apex,    * morphology suggestive of apical    * variant hypertrophic cardiomyopathy.    * Decreased right ventricular systolic function.    * The left atrium is moderately dilated.    * The right atrium is dilated.    * No evidence of interatrial shunt by color Doppler or agitated saline (with  and without provocative maneuver).    * There is severe tricuspid regurgitation.    * Severe pulmonary hypertension with estimated right ventricular systolic  pressure of  85 mmHg.    * The IVC is dilated with < 50% respiratory variance consistent with  significantly elevated RA pressure of 15 mmHg.    * Small posterior pericardial effusion visualized.    * Compared to the prior study on 02/02/2020,  TR is worse.      Assessment and Plan:  74 y.o. 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 .     Neuro:   NAI      Cardio:   #Shock, mixed cardiogenic vs. less likely septic  -Borderline hypotension w/evidence of end organ ischemia w/rising lactic acid and AKI.  -Originally appeared to be 2/2 sepsis from aspiration pna now on appropriate abx, worsening pulmonary congestion and rising BNP suggest more cardiogenic component now  -Aspiration pna mgmt as below  -HFpEF mgmt as below  -Midodrine 10 mg TID for pressure support; may help with systemic vasodilation 2/2 PH meds  -Goal MAP > 65, @ goal w/midodrine    #HCM  #HFpEF (50%)  -Repeat echo shows LVEF 55-60% and severe TR worsened from previous echo in Oct 2021, severe PH w/RVSP 85 mmHg.  -BNP rose from 186 -> > 1,000  -CT chest showed b/l effusions favoring pulmonary edema > infection   -Caution w/fluid balance in setting of restrictive physio from HCM  -Inadequate response to 40 mg po torsemide and 80 mg IV lasix yesterday  -Bumex 3 mg IV x 1 this AM, monitor output w/goal net negative 1L  -If not meeting goal by midday will redose bumex 4 mg and add diuril     #Hx Afib  -S/p pacemaker upgrade 12/9.   -Eliquis 5 mg q12h    #PAH- on chart review either group 1 or 2  -See resp      Resp:   #Acute hypoxic respiratory failure  #PAH/PH Group 1 vs. 2  #PNA 2/2 aspiration   Originally appeared to be 2/2 anesthesia w/increasd afterload to RV. Evidence of shunt by A-a gradient 303.7 on admission. Could be explained by aspiration now developed into pna but likely 2/2 PH as well. Somewhat volume overloaded on admission to CCU but now approaching euvolemia with tight window of fluid mgmt 2/2 restrictive physiology of HCM.   -CT chest noncon suggests congestion > infection  -Off NO  -HFNC @ FiO2 100% and flow 50 L/min; titrate to maintain SpO2 > 88%.   -C/w BiPAP at nighttime while sleeping (uses at  home)  -Uptravi (selexipag) 1,200 mcg po q12h  -Prednisone 10 mg qD 12/15 -> increase to 20 mg qD given c/f gout flare w/renal function limiting use of NSAIDs  -Advanced lung following, appreciate recs    #Congestion  -Flonase BID   -Nasal saline prn    #Anterior tonsillar pillar avulsion  -Likely 2/2 intubation trauma  -Exudates on b/l adenoids  -ENT evaluated, recommend conservative mgmt      Renal /Fluid, Electrolytes:   #AKI on CKD - improving  -Cr baseline 1.1  -Initially improved as shock was managed however worsened again to 1.7 from 1.5 w/BUN:Cr > 20  -Likely worsened 2/2 cardiorenal syndrome   -Diuresis as above  -Avoid nephrotoxic agents    #AGMA - 2/2 lactic acidosis - resolved  #MA - 2/2 ?RTA, unclear etiology - resolved   -Na-HCO3 650 mg QID -> discontinue 12/16 to aid volume removal     #Hypernatremia - resolved    GI:   #Nutrition  -Heart healthy diet    #PUD ppx  #GERD  -Takes protonix 40 mg po qD at home  -Pepcid 20 mg po qD     #Bowel regimen  -Pericolace 8.6-50 mg q12h       Infectious Disease (ID):   #PNA   -Likely 2/2 aspiration in setting of acute hypoxic respiratory failure post extubation  -  Pt initially had significant thick green-brown sputum production. Continues to have productive cough but improved since abx started.  -Imaging (CXR and CT) suggestive of edema > infection making worsened pna unlikely  -Micro w/u negative   -Blood cx NGTD x 4D   -Throat cx negative    -Sputum cx mixed upper respiratory flora   -MRSA nares/throat negative   -Urine cx negative  -Albuterol nebs 2.5 mg BID to aid mucus expulsion   -IS, acapella  -Vanc IV per pharmacy (12/11 - 12/12 ), [place] given negative MRSA swab  -Zosyn 4.5 mg IV q8h (12/11 - ); plan for 7-10 day course        Hem/Onc:   -NAI      Endo:   #Hypothyroidism  -Synthroid 50 mcg qD     MSK:  #Gout, acute flare  -L great toe and L ankle affected  -Pt has been off of home allopurinol and colchicine due to AKI on CKD  -Increase prednisone from 10 to  20 mg qD  -Add colchicine 0.6 mg po BID and closely monitor renal function; anticipate improvement w/diuresis and CrCl currently >30    Prophylaxis:  - DVT: Home eliquis  - GI:  Pepcid in place of home protonix    Code Status: No CPR w/support    Dispo: Transfer to [facility] pending bed availability     Lines/Drains/Airways:  -PIV x 2  -Foley       Signed by: Alyson Ingles, MD, MD  Date/Time: 03/22/20 7:26 AM    [facility] Attending Attestation:    This patient has a high probability of sudden clinically significant deterioration which requires the highest level of physician preparedness to intervene urgently. I managed/supervised life or organ supporting interventions that required frequent physician assessments.   I devoted my full attention to the direct care of this patient for this period of time.     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) on home 025 l sats 70's 92, most recently on selexipag (though Adv HF and Adv Lung notes are unclear on whether pt has benefited from pulmonary vasodilators--and has perhaps even worsened on them), 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 Pf note, pt is a Jehovah's witness and declines blood transfusions.      Neuro:  - pt wishes to be DNR/DNI  - Palliativefollowing    CV:  - decreased home selexipag, given hypotension  - starting midodrine and antibiotics  - midodrine 10 TID   - very low cardiac output at baseline, hence upgrade from single-chamber pacemaker to dual-chamber pacemaker (in hopes synchronization would augment her CO)  -  TTE w/ bubble: LVEF 55-60%, apical variant HCM, grade III diastolic dysfunction, no evidence of interatrial shunt, severe PH RVSP 85, severe TR, TAPSE 0.70, small posterior pericardial effusion  - Echo w/ bubble 09/08/17 found no evidence of right to left shunt, though AF ablation procedure in 2017 included transseptal puncture  -diuresis goal 1-2 liters    Pulm:  - CXR w/ prominent vasculature and new opacities  - spot diuresis as toleratedas above  - startedabx 12/11, given productive sputumcontinue x7 d  - albuterol q12 for airway clearance  - IPV vs Acapella for airway clearance  - CPAP QHS  - previously: HRCT without ILD or pulmonary fibrosis; dual energy CTA negative for clots repeat to evaluate for consolidation  - Advanced Lung following low dose steroid more aggressive nebs    ID:   - now w/ productive cough; pt may have aspirated during anesthesia w/ LMA  -s/p Zosyn 12/11-consider meropenem if no improvement  - pan-culture, including Legionella Ag and RVP  - MRSA nares negative  - UA+ but UCx negative  - also w/ sore throat and erythema / pus on right posterior pharynx, perhaps secondary to trauma w/ intubation  - ENT performed rhinolaryngoscopy: anterior tonsillar pillar avulsion likely secondary to intubation  - throat culture: "nobetahemolytic Streptococcusgroup A, C, or G isolated, no further work"    Renal/Fluid, Electrolytes:  - NAGMA,  unclear etiology  -colchicine steroid for gout left foot    Heme:  - c/w home Eliquis (AF)  - pt is a Jehovah's witness and declines blood transfusions    GI/Nurition:feeding    Endo:  - c/w home [id number]    Lines/Tubes:   - none    PPx:  -DVT: Eliquis  -GI: famotidinehome med  Code status: DNR / DNI                    Any critical care time was performed today and is exclusive of teaching, billable procedures, and not overlapping with any other providers.    I have personally examined the patient and reviewed the patients history, physical exam, and additional data as recorded in the ICU team note and I agree with care plan as recorded.    Critical care time:46 minutes

## 2020-03-22 NOTE — OT Progress Note (Signed)
Occupational Therapy Note  [facility]   Occupational Therapy Treatment     Patient: [name]    MRN#: [id number]   [facility]  Bed: FI102/FI102-01      Post Acute Care Therapy Recommendations:   Discharge Recommendations: SNF     Milestones to be reached to achieve recommendation: none    DME Recommended for Discharge:  (TBD at rehab)    If SNF  recommended discharge disposition is not available, patient will need mod assist for ADLs and OOB activities, BSc equipment, and HHOT.     Therapy discharge recommendations may change with patient status.  Please refer to most recent note for up-to-date recommendations.    Assessment:   Significant Findings: desatting with grooming and TE in chair - RT aware    Pt agreeable to work with OT. Pt completed TE for B UE and B LE in chair x 5-10 reps with rest breaks. Pt also completed grooming tasks in chair with set up. Pt deferred standing 2/2 desatting in chair. Pt will benefit from cont OT to max I and safety with ADLs and functional transfers.     Treatment Activities: self care, TE, TA, education    Educated the patient to role of occupational therapy, plan of care, goals of therapy and HEP, safety with mobility and ADLs, pursed lip breathing.    Plan:    OT Frequency Recommended: 2-3x/wk     Continue plan of care.       Precautions and Contraindications:   Falls  O2  04/11/2020 s/p Dual chamber pacemaker upgrade (Medtronic)    Updated Medical Status/Imaging/Labs:  CT Chest WO Contrast  Result Date: 03/21/2020  1. New smooth interstitial thickening, groundglass and trace effusions favoring congestion. 4 mm subpleural right lower lobe nodule needs no additional follow-up in the absence of known malignancy or significant risk factors. 2. Stable mediastinal adenopathy. Bosie Helper, MD  03/21/2020 11:42 AM    XR Chest AP Portable  Result Date: 03/21/2020   Persistent, potentially increasing moderate cardiomegaly and/or  pericardial effusion. Unchanged moderate diffuse bilateral interstitial lung opacities. Remainder as above. Wilmon Pali, MD  03/21/2020 7:19 AM    Subjective: "The physical therapist got me in the chair"   Patient's medical condition is appropriate for Occupational Therapy intervention at this time.  Patient is agreeable to participation in the therapy session. Nursing clears patient for therapy.    Pain:   Scale: not rated  Location: L heel   Intervention: positioning; pain meds per chart    Objective:   Patient is seated in a bedside chair with CICU monitors/lines, HFNC, tele, IV in place.  Pt wore mask during therapy session:No       SpO2 t/o session 78-94% (RT in room at end of session)    Cognition  Alert, follows simple commands    Functional Mobility  Rolling:  Pt in chair  Sit to Stand: deferred 2/2 O2 sats    PMP Activity: Step 2 - Supine Exercises    Self Care and Home Management  Eating: I hand to mouth  Grooming: set up in chair    Therapeutic Exercises  B UE and B LE in chair with PPM precautions     Participation: good  Endurance: fair-    Patient left with call bell within reach, all needs met, SCDs off as found, fall mat as found, chair alarm as found and all questions answered. RN notified of  session outcome and patient response.     Goals:  Time For Goal Achievement: 5 visits  ADL Goals  Patient will groom self: Supervision  Patient will dress upper body: Modified Independent  Patient will dress lower body: Supervision  Patient will toilet: Supervision  Mobility and Transfer Goals  Pt will transfer bed to toilet: Supervision        Executive Fucntion Goals  Pt will follow pacemaker precautions: with supervision,Recall 3/3,to increase ability to complete ADLs                  PPE worn during session: procedural mask, goggles and gloves    Tech present: no  PPE worn by tech: N/A      Time of Treatment  OT Received On: 03/22/20  Start Time: 1140  Stop Time: 1205  Time Calculation (min): 25  min    Treatment # 1 of 5 visits    Signature: Felecia Shelling MS, OTR  Pager# [phone]

## 2020-03-22 NOTE — Plan of Care (Signed)
No acute events over night, pt continues to have diminished urine output.

## 2020-03-23 ENCOUNTER — Inpatient Hospital Stay: Payer: Medicare Other

## 2020-03-23 LAB — CBC AND DIFFERENTIAL
Absolute NRBC: 0.37 10*3/uL — ABNORMAL HIGH (ref 0.00–0.00)
Basophils Absolute Automated: 0.03 10*3/uL (ref 0.00–0.08)
Basophils Automated: 0.2 %
Eosinophils Absolute Automated: 0.2 10*3/uL (ref 0.00–0.44)
Eosinophils Automated: 1.4 %
Hematocrit: 26.3 % — ABNORMAL LOW (ref 34.7–43.7)
Hgb: 8.6 g/dL — ABNORMAL LOW (ref 11.4–14.8)
Immature Granulocytes Absolute: 0.19 10*3/uL — ABNORMAL HIGH (ref 0.00–0.07)
Immature Granulocytes: 1.4 %
Lymphocytes Absolute Automated: 1.33 10*3/uL (ref 0.42–3.22)
Lymphocytes Automated: 9.6 %
MCH: 32.3 pg (ref 25.1–33.5)
MCHC: 32.7 g/dL (ref 31.5–35.8)
MCV: 98.9 fL — ABNORMAL HIGH (ref 78.0–96.0)
MPV: 12.1 fL (ref 8.9–12.5)
Monocytes Absolute Automated: 1 10*3/uL — ABNORMAL HIGH (ref 0.21–0.85)
Monocytes: 7.2 %
Neutrophils Absolute: 11.13 10*3/uL — ABNORMAL HIGH (ref 1.10–6.33)
Neutrophils: 80.2 %
Nucleated RBC: 2.7 /100 WBC — ABNORMAL HIGH (ref 0.0–0.0)
Platelets: 175 10*3/uL (ref 142–346)
RBC: 2.66 10*6/uL — ABNORMAL LOW (ref 3.90–5.10)
RDW: 19 % — ABNORMAL HIGH (ref 11–15)
WBC: 13.88 10*3/uL — ABNORMAL HIGH (ref 3.10–9.50)

## 2020-03-23 LAB — BASIC METABOLIC PANEL
Anion Gap: 17 — ABNORMAL HIGH (ref 5.0–15.0)
Anion Gap: 18 — ABNORMAL HIGH (ref 5.0–15.0)
BUN: 49 mg/dL — ABNORMAL HIGH (ref 7.0–19.0)
BUN: 49 mg/dL — ABNORMAL HIGH (ref 7.0–19.0)
CO2: 23 mEq/L (ref 22–29)
CO2: 24 mEq/L (ref 22–29)
Calcium: 8.9 mg/dL (ref 7.9–10.2)
Calcium: 8.4 mg/dL (ref 7.9–10.2)
Chloride: 103 mEq/L (ref 100–111)
Chloride: 100 mEq/L (ref 100–111)
Creatinine: 1.8 mg/dL — ABNORMAL HIGH (ref 0.6–1.0)
Creatinine: 1.7 mg/dL — ABNORMAL HIGH (ref 0.6–1.0)
Glucose: 94 mg/dL (ref 70–100)
Glucose: 145 mg/dL — ABNORMAL HIGH (ref 70–100)
Potassium: 3.7 mEq/L (ref 3.5–5.1)
Potassium: 3.8 mEq/L (ref 3.5–5.1)
Sodium: 143 mEq/L (ref 136–145)
Sodium: 142 mEq/L (ref 136–145)

## 2020-03-23 LAB — MAGNESIUM: Magnesium: 2 mg/dL (ref 1.6–2.6)

## 2020-03-23 LAB — PHOSPHORUS: Phosphorus: 5.1 mg/dL — ABNORMAL HIGH (ref 2.3–4.7)

## 2020-03-23 LAB — GFR
EGFR: 33.2
EGFR: 35.5

## 2020-03-23 MED ORDER — ALBUTEROL SULFATE (2.5 MG/3ML) 0.083% IN NEBU
2.5000 mg | INHALATION_SOLUTION | Freq: Four times a day (QID) | RESPIRATORY_TRACT | Status: DC
Start: 2020-03-23 — End: 2020-03-23

## 2020-03-23 MED ORDER — POTASSIUM CHLORIDE 20 MEQ PO PACK
40.0000 meq | PACK | Freq: Once | ORAL | Status: AC
Start: 2020-03-23 — End: 2020-03-23
  Administered 2020-03-23: 11:00:00 40 meq via ORAL
  Filled 2020-03-23: qty 2

## 2020-03-23 MED ORDER — POTASSIUM CHLORIDE CRYS ER 20 MEQ PO TBCR
40.0000 meq | EXTENDED_RELEASE_TABLET | Freq: Once | ORAL | Status: AC
Start: 2020-03-23 — End: 2020-03-23
  Administered 2020-03-23: 13:00:00 40 meq via ORAL
  Filled 2020-03-23: qty 2

## 2020-03-23 MED ORDER — BUMETANIDE 0.25 MG/ML IJ SOLN
2.0000 mg | Freq: Two times a day (BID) | INTRAMUSCULAR | Status: DC
Start: 2020-03-23 — End: 2020-03-23
  Filled 2020-03-23 (×2): qty 8

## 2020-03-23 MED ORDER — ALBUTEROL SULFATE (2.5 MG/3ML) 0.083% IN NEBU
2.5000 mg | INHALATION_SOLUTION | Freq: Three times a day (TID) | RESPIRATORY_TRACT | Status: DC
Start: 2020-03-23 — End: 2020-03-24
  Administered 2020-03-23 – 2020-03-24 (×2): 2.5 mg via RESPIRATORY_TRACT
  Filled 2020-03-23 (×3): qty 3

## 2020-03-23 MED ORDER — BUMETANIDE 0.25 MG/ML IJ SOLN
2.0000 mg | Freq: Three times a day (TID) | INTRAMUSCULAR | Status: DC
Start: 2020-03-23 — End: 2020-03-23
  Administered 2020-03-23: 13:00:00 2 mg via INTRAVENOUS
  Filled 2020-03-23 (×3): qty 8

## 2020-03-23 MED ORDER — BUMETANIDE 0.25 MG/ML IJ SOLN
2.0000 mg | Freq: Three times a day (TID) | INTRAMUSCULAR | Status: DC
Start: 2020-03-23 — End: 2020-03-23
  Filled 2020-03-23 (×2): qty 8

## 2020-03-23 MED ORDER — SODIUM CHLORIDE 0.9 % IV MBP
4.5000 g | Freq: Three times a day (TID) | INTRAVENOUS | Status: DC
Start: 2020-03-23 — End: 2020-03-24
  Administered 2020-03-23 – 2020-03-24 (×3): 4.5 g via INTRAVENOUS
  Filled 2020-03-23 (×4): qty 20

## 2020-03-23 MED ORDER — PREDNISONE 20 MG PO TABS
40.0000 mg | ORAL_TABLET | Freq: Every morning | ORAL | Status: DC
Start: 2020-03-24 — End: 2020-03-26
  Administered 2020-03-24 – 2020-03-25 (×2): 40 mg via ORAL
  Filled 2020-03-23 (×2): qty 2

## 2020-03-23 MED ORDER — BUMETANIDE 0.25 MG/ML IJ SOLN
2.0000 mg | Freq: Two times a day (BID) | INTRAMUSCULAR | Status: DC
Start: 2020-03-23 — End: 2020-03-23
  Administered 2020-03-23: 11:00:00 2 mg via INTRAVENOUS
  Filled 2020-03-23 (×2): qty 8

## 2020-03-23 MED ORDER — BUMETANIDE INFUSION 0.25 MG/ML
2.0000 mg/h | INTRAVENOUS | Status: DC
Start: 2020-03-23 — End: 2020-03-26
  Administered 2020-03-23 – 2020-03-24 (×2): 1 mg/h via INTRAVENOUS
  Administered 2020-03-25 – 2020-03-26 (×3): 2 mg/h via INTRAVENOUS
  Filled 2020-03-23 (×6): qty 100

## 2020-03-23 MED ORDER — MAGNESIUM SULFATE IN D5W 1-5 GM/100ML-% IV SOLN
1.0000 g | Freq: Once | INTRAVENOUS | Status: AC
Start: 2020-03-23 — End: 2020-03-23
  Administered 2020-03-23: 11:00:00 1 g via INTRAVENOUS
  Filled 2020-03-23: qty 100
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
## 2020-03-23 NOTE — Plan of Care (Signed)
Patient with worsened oxygenation throughout the course of the afternoon despite 2.2 liters of urinary output with Bumex. Creatinine also improved to 1.7 from 1.8.    She was persistently saturating in the mid 70s to low 80s despite 100%/60 liters with an oxy mask over it.     I had a conversation with her in front of her younger daughter at bedside and older daughter over the phone. I explained to her that she appeared to be in significant respiratory distress (tachypnea into the 40s). She agreed that her breathing was uncomfortable.    We agreed to the following plan together:    1) We will try to optimize her oxygenation with NIPPV.  2) Start Bumex drip in hopes to help her overload.    If she is unable to improve with these measures she would like to start medications to make her breathing more comfortable. I explained to her that morphine was one of the medications that we frequently use in these situations and they agreed to this.    I told her that I understood that her hope was to go home and we would try to get to her goal but we were limited in our options beyond the above measures. She stated that she understood.    Emerson Monte. Williams Che, M.D.  [facility] Attending

## 2020-03-23 NOTE — Progress Notes (Signed)
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 Her CICU course has been complicated by cardiogenic shock secondary RV failure and worsening hypoxic respiratory failure. She was transferred to [facility] on 12/16.      Problem List:   Problem List:   Patient Active Problem List   Diagnosis    DOE (dyspnea on exertion)    OSA (obstructive sleep apnea)    Apical variant hypertrophic cardiomyopathy    Hypothyroidism    Pacemaker    Pulmonary hypertension, primary    Patient is Jehovah's Witness    Pacemaker generator end of life    Encounter for pacemaker at end of battery life       Last 24 hours:      Patient seen and examined at bedside.   Transferred overnight from CICU.  Dyspnea symptoms unchanged.  Complained of pain in her left toe and ankle.    Hospital Course:      12/9: developed acute on chronic hypoxic respiratory failure during extubation after elective upgrade of her pacemaker from single to dual chamber.  12/10: extubated and placed on HFNC with iNO.  12/11: started on antibiotics for possible pneumonia.   12/14: weaned off iNO   12/16: transferred to [facility]    Plan:      NEURO:   Reported pain in her left toe and ankle, elevated uric acid, home allopurinol held, likely gout, will increase prednisone to 40 mg daily, continue colchicine.     CARDIAC:   Currently in RV failure, severe pulmonary  hypertension.   Will continue aggressive diuresis to offload the RV.   Continue Uptravi 1200 mcg Q12, midodrine to offset hypotension.    Rate controlled atrial fibrillation, on Eliquis for AC.   Repeat 2D echo ordered.     PULM:   Acute on chronic hypoxic respiratory failure, likely secondary to RV failure.   Currently on HFNC at 100%/50 liters, will continue to titrate down as able.   Advanced lung disease adjusting pulmonary hypertension medications.   Albuterol TID for bronchodilatory effect.    Continue to maintain saturations > 90%.     GI:   GI prophylaxis: Pepcid daily   Nutrition: SLP following, diet adjusted as per recommendations.    RENAL:   Creatinine increased to 1.8 this AM.   Likely cardiorenal as appears significantly volume up.   Will start aggressive diuresis with Bumex 2 mg IV TID, may need some Zaroxolyn to aid, can consider drip if inadequate response.   Repeat electrolytes in the afternoon as potassium and mag on the lower side.   Continue to monitor lytes.      ID:   Day 7 of Zosyn today.   Cultures negative til day.   Check procal in morning, can discontinue antibiotics if not significantly increased.     HEME:   Pharmacologic prophylaxis: currently on therapeutic Eliquis for atrial fibrillation.   No  other acute issues.    ENDO/RHEUM:    Glucose goal: 100-180    LINES/FOLEY: Foley for I's and O's    CODE STATUS: NO CPR - SUPPORT OK     Patient is currently able to make medical decisions.      Discussed current diagnoses, prognosis and goals of care.     CONSULTANTS: ALD    DISPO:   - Anticipated discharge needs: Stays in [facility] due to HFNC needs    Review of Systems:   Review of Systems - as above    Physical Exam:   Temp:  [97.6 F (36.4 C)-98.8 F (37.1 C)] 98.2 F (36.8 C)  Heart Rate:  [69-72] 70  Resp Rate:  [25-54] 27  BP: (93-118)/(51-72) 105/63  FiO2:  [50 %-100 %] 90 %    BP 105/63    Pulse 70    Temp 98.2 F (36.8 C) (Oral)    Resp (!) 27    Ht 1.613 m (5'  3.5")    Wt 85.5 kg (188 lb 7.9 oz)    SpO2 91%    BMI 32.86 kg/m      General: awake, alert, oriented x 3   HEENT: pupils equal with EOMI  Cardiovascular: irregular rhythm, no murmurs  Lungs: bilateral crackles  Abdomen: soft, non-tender, non-distended  Extremities: no clubbing, cyanosis, lower extremity edema noted  Neuro: no focal abnormalities   Musculoskeletal: nl ROM, no muscle weakness    Labs:     Results     Procedure Component Value Units Date/Time    CULTURE BLOOD AEROBIC AND ANAEROBIC [[id number]] Collected: 03/18/20 0354    Specimen: Blood, Venipuncture Updated: 03/23/20 1021    Narrative:      The order will result in two separate 8-75ml bottles  Please do NOT order repeat blood cultures if one has been  drawn within the last 48 hours  UNLESS concerned for  endocarditis  AVOID BLOOD CULTURE DRAWS FROM CENTRAL LINE IF POSSIBLE  Indications:->Pneumonia  ORDER#: [id number]                                    ORDERED BY: Leslee Home  SOURCE: Blood, Venipuncture                          COLLECTED:  03/18/20 03:54  ANTIBIOTICS AT COLL.:                                RECEIVED :  03/18/20 07:58  Culture Blood Aerobic and Anaerobic        FINAL       03/23/20 10:21  03/23/20   No growth after 5 days of incubation.      Magnesium [[id number]] Collected: 03/23/20 0421    Specimen: Blood Updated: 03/23/20 0603     Magnesium 2.0 mg/dL     Phosphorus [[id number]]  (Abnormal) Collected: 03/23/20 0421    Specimen: Blood Updated: 03/23/20 0603     Phosphorus 5.1 mg/dL     GFR [[id number]] Collected: 03/23/20 0421     Updated: 03/23/20 0603     EGFR 33.2    Basic Metabolic Panel [[id number]]  (Abnormal) Collected: 03/23/20 0421    Specimen: Blood Updated: 03/23/20 0603     Glucose 94 mg/dL      BUN 94.8 mg/dL  Creatinine 1.8 mg/dL      Calcium 8.9 mg/dL      Sodium 161 mEq/L      Potassium 3.7 mEq/L      Chloride 103 mEq/L      CO2 23 mEq/L      Anion Gap 17.0    CBC and differential [[id number]]  (Abnormal) Collected:  03/23/20 0421    Specimen: Blood Updated: 03/23/20 0508     WBC 13.88 x10 3/uL      Hgb 8.6 g/dL      Hematocrit 81.1 %      Platelets 175 x10 3/uL      RBC 2.66 x10 6/uL      MCV 98.9 fL      MCH 32.3 pg      MCHC 32.7 g/dL      RDW 19 %      MPV 12.1 fL      Neutrophils 80.2 %      Lymphocytes Automated 9.6 %      Monocytes 7.2 %      Eosinophils Automated 1.4 %      Basophils Automated 0.2 %      Immature Granulocytes 1.4 %      Nucleated RBC 2.7 /100 WBC      Neutrophils Absolute 11.13 x10 3/uL      Lymphocytes Absolute Automated 1.33 x10 3/uL      Monocytes Absolute Automated 1.00 x10 3/uL      Eosinophils Absolute Automated 0.20 x10 3/uL      Basophils Absolute Automated 0.03 x10 3/uL      Immature Granulocytes Absolute 0.19 x10 3/uL      Absolute NRBC 0.37 x10 3/uL     CULTURE BLOOD AEROBIC AND ANAEROBIC [[id number]] Collected: 03/17/20 1622    Specimen: Blood, Venipuncture Updated: 03/22/20 2121    Narrative:      The order will result in two separate 8-27ml bottles  Please do NOT order repeat blood cultures if one has been  drawn within the last 48 hours  UNLESS concerned for  endocarditis  AVOID BLOOD CULTURE DRAWS FROM CENTRAL LINE IF POSSIBLE  Indications:->Pneumonia  ORDER#: [id number]                                    ORDERED BY: Leslee Home  SOURCE: Blood, Venipuncture                          COLLECTED:  03/17/20 16:22  ANTIBIOTICS AT COLL.:                                RECEIVED :  03/17/20 19:08  Culture Blood Aerobic and Anaerobic        FINAL       03/22/20 21:21  03/22/20   No growth after 5 days of incubation.            Radiology / Imaging:     CT Chest WO Contrast   Final Result      1. New smooth interstitial thickening, groundglass and trace effusions   favoring congestion. 4 mm subpleural right lower lobe nodule needs no   additional follow-up in the absence of known malignancy or significant   risk factors.   2. Stable mediastinal adenopathy.      Bosie Helper, MD  03/21/2020 11:42 AM       XR Chest AP Portable   Final Result    Persistent, potentially increasing moderate cardiomegaly   and/or pericardial effusion. Unchanged moderate diffuse bilateral   interstitial lung opacities. Remainder as above.      Wilmon Pali, MD    03/21/2020 7:19 AM      XR Chest AP Portable   Final Result    Slightly decreasing vascular congestion and interstitial   edema. Remainder as above.      Wilmon Pali, MD    03/20/2020 7:47 AM      XR Chest AP Portable   Final Result   Findings of pulmonary edema, bilateral pleural effusions and   multifocal airspace opacity.      Colonel Bald, MD    03/19/2020 8:53 AM      XR Chest AP Portable   Final Result     CHF      Gerlene Burdock, MD    03/18/2020 8:17 AM      XR Chest AP Portable   Final Result   . Improved edema post extubation       Marty Heck, MD    03/17/2020 12:19 PM      Echocardiogram Adult W Contrast Complete W Color Doppler   Final Result      CT Head WO Contrast   Final Result    CT scan of the head shows no hydrocephalus, herniation, or   intracranial hemorrhage. There is no visible acute intracranial   abnormality.      Miguel Dibble, MD    03/16/2020 5:11 AM      XR Chest AP Portable   Final Result      1. Cardiac enlargement and increased vascular congestion   2. Bibasilar opacities and increased lingular opacities, likely   atelectasis      Genelle Bal    03/27/2020 8:17 PM      Echocardiogram Adult Comp W Clr/Dop/Bubble    (Results Pending)       I have spent 43 minutes providing critical care of this patient with the following problems: acute hypoxic respiratory failure. >50% of that time was spent in counseling and coordination of care. Any care time performed today is exclusive of teaching, billable procedures, and not overlapping with any other providers.    Signed by: Loistine Simas, MD  03/23/2020 1:55 PM  GU:[id number], Despina Arias, MD

## 2020-03-23 NOTE — Progress Notes (Signed)
[facility] Palliative Medicine & Comprehensive Care   Service Phone Number: FX: [phone], Mon-Fri 9a-4p / Xtend Pager: [phone] (24/7)     Palliative Care Progress Note   Date Time: 03/23/20 3:22 PM   Patient Name: [name], [name]   Location: B147/W295.62   Attending Physician: Loistine Simas, MD   Primary Care Physician: Georgeanna Lea, MD   Consulting Provider: Namon Cirri, MD   Consulting Service: Palliative Medicine and Comprehensive Care  Consulted request from Loistine Simas, MD to see patient regarding:   Reason for Referral: Clarify goals of care; Symptom (dyspnea, nausea, anxiety, fatigue, etc.)          Assessment & Plan   Impression   [name] 74 y.o. female with PAHon home O2, PAF s/p ablation on eliquis, dCHF and hypertrophic cardiomyopathy and question of ILD as well asreports if auto-immune lung condition who presented for an elective single-to-dual champer ICD upgrade whose course was c/b hypotension/hypoxia, now extubated POD1 but remains on HFNC. Palliative medicine consulted for goals of care          Estimated Prognosis: Weeks to months         Recommendations   1. Goals of Care:   - "I want to go home and live"  - "I am realistic and know that I am very sick and have limited options"  - requests open/honest updates as to how she is doing and how realistic her goal of getting home is  - Declines further attempts at intubation  - declines CPR  - Requests completion of Advanced directive, done today  - POST completed  - not interested in home hospice, but is interested in home palliative  - referral for home palliative care order placed  - she would like to donate her body to a medical school for research.  --- have made contact with the uniformed services who will reach out to the patient  --- [employer] [phone] 7am-4pm     Barriers to her primary goal of care to get home  - needs to wean down oxygen requirement - remains on 90%  FiO2      Code Status:       - NO CPR - SUPPORT OK      Advance Care Planning:  ACP Validation: Valid advanced care planning documents completed during admission and Valid durable DNR/POST document completed during admission  Medical Decision Maker: Advance directives: Health Care Agent: [name] [phone] (Relationship: friend)  ACP Document: HCPOA and DDNR/POST     2. Psychosocial: pallitive medicine LCSW following    3. Spiritual:   - Religion: Jehovah's Witness.   - Chaplain available for support as needed    4. PC Team follow-up plans: tomorrow      Discharge Disposition: Home      Outpatient Follow Up Recommended: Yes    Outcomes: Family meetings, Clarified goals of care, Provided advance care planning, Completed durable DNR, ACP counseling assistance and POST completion  25 minutes.  Total time on unit today in care of [name]   including chart review, face to face evaluation and management. >50% of time spent in counseling & coordination of care with Patient, Family, Consultants and referring provider with recommendations above.       Start Time:  1500                 Stop Time:   1525  Namon Cirri, MD, MD  Palliative Medicine & Comprehensive Care  Phone Number: [phone], Mon-Fri 9a-4p   Xtend Pager: [phone] (24/7)     Interval History   [name] is a 74 y.o. female admitted to hospital on 03/20/2020 with Encounter for pacemaker at end of battery life [Z45.010]  Pacemaker generator end of life [Z45.010]     Patient seen at bedside      No major issues. Pt reports that she was fearful while moving rooms but that is now resolved and she is comfortable in her new room. Breathing is comfortable, no more bleeding. Denies nausea, pain or appetite problems.           Goals of Care   CURRENT CPR Status: NO CPR - SUPPORT OK          Advanced Care Planning:      Decisional Capacity: yes      Advance Directives have been completed in the past: no      Advance  Directives are available in chart: yes      Discussed this admission: yes      ACP note completed: yes       Date: 03/19/20      GOC Discussion: "I want to go home to live, but I understand that I am very sick and my options are limited" - declines hospice informational visit at this time  - would like to donate her body to a medical school for research/education      Outcome of Discussion: treatment/curative pathway        Palliative Functional and Symptom Assessment     Palliative Performance Scale: 70% - Reduced ambulation, unable to do normal work, some evidence of disease, full self-care, normal or reduced intake, full LOC    [place] Symptom Assessment Scale (ESAS): Completed  Anxiety: 0  Depression: 0  Drowsiness: 0  Lack of Appetite: 0  Nausea: 0  Pain: 0  Shortness of Breath: 4  Tiredness: 4  Well-being: 4     Medications   Scheduled Meds  Current Facility-Administered Medications   Medication Dose Route Frequency    albuterol  2.5 mg Nebulization Q8H SCH    apixaban  5 mg Oral Q12H SCH    bumetanide  2 mg Intravenous TID    colchicine  0.6 mg Oral BID    famotidine  20 mg Oral Daily    fluticasone  1 spray Each Nare Daily    levothyroxine  50 mcg Oral Daily at 0600    midodrine  10 mg Oral TID MEALS    oxymetazoline  2 spray Each Nare Q12H SCH    piperacillin-tazobactam  4.5 g Intravenous Q8H    [START ON 03/24/2020] predniSONE  40 mg Oral QAM W/BREAKFAST    selexipag  1,200 mcg Oral Q12H SCH    senna-docusate  1 tablet Oral Q12H SCH      DRIPS     PRN MEDS  Current Facility-Administered Medications   Medication Dose    acetaminophen  650 mg    benzocaine-menthol  1 lozenge    calcium carbonate  500 mg    lidocaine viscous  5 mL    ondansetron  4 mg    phenol  1 spray    saline      saline  1 spray       Allergies   Allergies   Allergen Reactions    Amiodarone Other (See Comments)     Lung  toxicity    Cellcept [Mycophenolate]      Gastro hemorrhages     Biopatch Protective  Disk-Chg [Chlorhexidine] Itching    Hibiclens [Chlorhexidine Gluconate] Itching     "Severe itiching"    Zyban [Bupropion] Itching       Physical Exam   BP 106/56    Pulse 70    Temp 98.2 F (36.8 C) (Oral)    Resp (!) 36    Ht 1.613 m (5' 3.5")    Wt 85.5 kg (188 lb 7.9 oz)    SpO2 93%    BMI 32.86 kg/m    Physical Exam:  General: well developed, chronically ill appearing woman in NAD   HEENT:  EOMI, anicteric, HFNC in place, no epistaxis, MMM  Neck: supple   CV: irreg rate  Lungs: tachypneic, on HFNC, CTAB   Abd: soft, ND, NABS  Ext: Trace pedal edema  Neuro: awake, alert, oriented x 3, no focal deficits  Psych:  appropriate insight and judgement, mood and affect congruent to current situation  Skin: no rashes or lesions noted      Labs / Radiology   Lab and diagnostics: reviewed in Epic  Recent Labs   Lab 03/23/20  0421   WBC 13.88*   Hgb 8.6*   Hematocrit 26.3*   Platelets 175              Recent Labs   Lab 03/23/20  0421   Sodium 143   Potassium 3.7   Chloride 103   CO2 23   BUN 49.0*   Creatinine 1.8*   EGFR 33.2   Glucose 94   Calcium 8.9     Recent Labs   Lab 03/20/20  0347   Bilirubin, Total 1.1   Bilirubin Direct 0.4   Protein, Total 6.3   Albumin 2.7*   ALT 22   AST (SGOT) 24          CT Chest WO Contrast    Result Date: 03/21/2020  1. New smooth interstitial thickening, groundglass and trace effusions favoring congestion. 4 mm subpleural right lower lobe nodule needs no additional follow-up in the absence of known malignancy or significant risk factors. 2. Stable mediastinal adenopathy. Bosie Helper, MD  03/21/2020 11:42 AM    XR Chest AP Portable    Result Date: 03/21/2020   Persistent, potentially increasing moderate cardiomegaly and/or pericardial effusion. Unchanged moderate diffuse bilateral interstitial lung opacities. Remainder as above. Wilmon Pali, MD  03/21/2020 7:19 AM    XR Chest AP Portable    Result Date: 03/20/2020   Slightly decreasing vascular congestion and interstitial edema.  Remainder as above. Wilmon Pali, MD  03/20/2020 7:47 AM    XR Chest AP Portable    Result Date: 03/19/2020  Findings of pulmonary edema, bilateral pleural effusions and multifocal airspace opacity. Colonel Bald, MD  03/19/2020 8:53 AM    XR Chest AP Portable    Result Date: 03/18/2020    CHF Gerlene Burdock, MD  03/18/2020 8:17 AM    XR Chest AP Portable    Result Date: 03/17/2020  . Improved edema post extubation Marty Heck, MD  03/17/2020 12:19 PM
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
## 2020-03-23 NOTE — Progress Notes (Signed)
SNF referrals placed in [place] patient lives in [place]    Lenis Dickinson RN, MSN  Clinical Case Manager   [facility]   Email: [email or web address]  Spectralink: Monday-Friday [phone] on Weekends call x [phone] Thank You

## 2020-03-23 NOTE — Plan of Care (Signed)
Assumed care at 1900   Pt on HFNC 100% /50L SaO2 93%, and desats with coughing and minimal exertion and talking.    Neuro> intact X4, afebrile, pleasant, FC, MAE. SOB on initial inspection.     CV>Vpaced with a recently inserted dual chamber pacer/AICD. SBP 100-110. Afib/Aflutter under pacer.    Pulm>Initially on 100%/50L HFNC, put on CPAP 90% 5 Peep SaO2 93-95%    GI/GU> poor appetite, Foley catheter with good UOP. +BM today 12/16     Plan to transfer to [facility] room 436, on palliative service

## 2020-03-23 NOTE — Progress Notes (Signed)
Major Shift Events:   - Bumex given for diuresis - 2L output   - Desats with exertion to 80s and sustains, 60L/100% HFNC switched to BiPAP for continued SOB and WOB despite Bumex   - ECHO obtained    Review of Systems    BP 105/56    Pulse 69    Temp 97.8 F (36.6 C) (Axillary)    Resp (!) 45    Ht 1.613 m (5' 3.5")    Wt 85.5 kg (188 lb 7.9 oz)    SpO2 96%    BMI 32.86 kg/m     Neuro:  A&Ox4, FC, MAE with generalized weakness    Cardiac:  Afib, dual pacemaker, SBP 100-110s, HR 70. Afebrile, PP, generalized edema in upper and lower extremities.     Respiratory:  60L/100% HFNC, diminished lung sounds with rhonchi    GI/GU:  Foley catheter, tolerates oral intake but appetite is decreased,  AUOP, 2 BM    Skin Assessment  Skin Integrity: (P) Bruising  Abrasion Skin Location: (P)  (L chest pacemaker )  Bruising Skin Location: (P) scattered    Braden Score: Braden Scale Score: (P) 17 (03/23/20 0155)      LDAWs  Patient Lines/Drains/Airways Status     Active Lines, Drains and Airways     Name Placement date Placement time Site Days    Peripheral IV 03/16/20 18 G Anterior;Right Upper Arm 03/16/20  1829  Upper Arm  6    Peripheral IV 03/18/20 Posterior;Right Wrist 03/18/20  0600  Wrist  5    Urethral Catheter Latex 14 Fr. 03/17/20  0223  Latex  6               Wound 03/24/2020 Surgical Incision Chest Left;Upper Incision for PPM atrial lead upgrade & gen change (Active)   Date First Assessed/Time First Assessed: 04/05/2020 1705   Wound Type: Surgical Incision  Location: Chest  Wound Location Orientation: Left;Upper  Wound Description (Comments): Incision for PPM atrial lead upgrade & gen change      Assessments 04/04/2020  6:25 PM 03/23/2020 12:00 AM   Site Description Clean;Dry;Intact Clean;Dry;Intact;Pink;Purple   Peri-wound Description  Clean;Dry;Intact   Closure  Other (Comment);Approximated, closed and dry   Drainage Amount  None   Dressing  Other (Comment)   Dressing Status  Intact       No Linked orders to display

## 2020-03-23 NOTE — Progress Notes (Signed)
CM noted 12/13 and 2 12/16 consults. We do not set up outpatient palliative care for patient while they are actively ill in this case patient is on HFNC 100%/50L    Lenis Dickinson RN, MSN  Clinical Case Manager   [facility]   Email: [email or web address]  Spectralink: Monday-Friday [phone] on Weekends call x [phone] Thank You

## 2020-03-23 NOTE — UM Notes (Signed)
SUMMARY:      Payor: MEDICARE / Plan: MEDICARE PART A AND B / Product Type: Medicare /       Number of review days in this clinical:  CONTINUED STAY REVIEW  03/23/20     12/17 MCR. IMC. In RV failure - Pul Htn, aggressive diuresis,  IV abx, Bumex iv   Currently in RV failure, severe pulmonary hypertension.   Will continue aggressive diuresis to offload the RV.        LOC:    .[facility] 4  PATIENT'S NAME: [name]  DOB: [birth year]  ADMIT DATE: Mar 18, 2020 12:11 PM  DX: The primary encounter diagnosis was Pulmonary hypertension, primary. Diagnoses of Encounter for pacemaker at end of battery life, Pacemaker, Apical variant hypertrophic cardiomyopathy, and Hypothyroidism, unspecified type were also pertinent to this visit.   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 She was transferred to [facility] on 12/16.        VS: BP 106/56    Pulse 70    Temp 98.2 F (36.8 C) (Oral)    Resp (!) 36    Ht 1.613 m (5' 3.5")    Wt 85.5 kg (188 lb 7.9 oz)    SpO2 93%    BMI 32.86 kg/m         MEDS:    Selected IV Meds: Scheduled Meds:  Current Facility-Administered Medications   Medication Dose Route Frequency    albuterol  2.5 mg Nebulization Q8H SCH    apixaban  5 mg Oral Q12H SCH    bumetanide  2 mg Intravenous TID    colchicine  0.6 mg Oral BID    famotidine  20 mg Oral Daily    fluticasone  1 spray Each Nare Daily    levothyroxine  50 mcg Oral Daily at 0600    midodrine  10 mg Oral TID MEALS    oxymetazoline  2 spray Each Nare Q12H SCH    piperacillin-tazobactam  4.5 g Intravenous  Q8H    [START ON 03/24/2020] predniSONE  40 mg Oral QAM W/BREAKFAST    selexipag  1,200 mcg Oral Q12H [facility]    senna-docusate  1 tablet Oral Q12H [facility]              CSR 03/23/20    DX: The primary encounter diagnosis was Pulmonary hypertension, primary. Diagnoses of Encounter for pacemaker at end of battery life, Pacemaker, Apical variant hypertrophic cardiomyopathy, and Hypothyroidism, unspecified type were also pertinent to this visit.  PAST MEDICAL HISTORY:  has a past medical history of A-fib, Anesthesia complication (Before 2020), Cardiomyopathy, CKD (chronic kidney disease), Congestive heart failure, COPD (chronic obstructive pulmonary disease), COVID-19 (06/2019), Difficulty walking, Disorder of thyroid, Gastrointestinal hemorrhage, Gout, Immunization series complete (06/2019, 09/2019, 10/2019), NSTEMI (non-ST elevated myocardial infarction) (02/2012), Obesity, On supplemental oxygen by nasal cannula, OSA on CPAP (CPAP since 2015), Pacemaker (01/2019), PNA (pneumonia) (Last ~2016), Pulmonary hypertension, and Refusal of blood product.  PAST SURGICAL HISTORY:   has a past surgical history that includes Insert / replace / remove pacemaker (01/2019); Cesarean section (1610); CARDIAC ABLATION (01/31/2019); Right Heart Cath ONLY incl. CO and Sats (Right, 01/24/2020); and PM Generator  Change Single (N/A, 03-30-2020).      BP 106/56    Pulse 70    Temp 98.2 F (36.8 C) (Oral)    Resp (!) 36    Ht 1.613 m (5' 3.5")    Wt 85.5 kg (188 lb 7.9 oz)    SpO2 93%    BMI 32.86 kg/m   FiO2:  [50 %-100 %] 90 %  S RR:  [6] 6  PEEP/EPAP:  [5 cm H20] 5 cm H20      MEDS:  Continuous IV Drips/gtts Meds:  Scheduled Meds:  Current Facility-Administered Medications   Medication Dose Route Frequency    albuterol  2.5 mg Nebulization Q8H SCH    apixaban  5 mg Oral Q12H SCH    bumetanide  2 mg Intravenous TID    colchicine  0.6 mg Oral BID    famotidine  20 mg Oral Daily    fluticasone  1 spray Each Nare Daily    levothyroxine  50  mcg Oral Daily at 0600    midodrine  10 mg Oral TID MEALS    oxymetazoline  2 spray Each Nare Q12H SCH    piperacillin-tazobactam  4.5 g Intravenous Q8H    [START ON 03/24/2020] predniSONE  40 mg Oral QAM W/BREAKFAST    selexipag  1,200 mcg Oral Q12H [facility]    senna-docusate  1 tablet Oral Q12H [facility]       Recent Labs     03/23/20  1417 03/23/20  0421 03/22/20  0434 03/21/20  0414 03/21/20  0414 03/20/20  2352 03/20/20  1755   WBC  --  13.88* 11.74*  --  11.61*  --   --    Hgb  --  8.6* 8.7*  --  9.0*  --   --    Hematocrit  --  26.3* 26.8*  --  28.0*  --   --    Platelets  --  175 165  --  180  --   --    Lactic Acid  --   --   --   --  1.5 1.5 2.1*   EGFR 35.5 33.2 35.5   < > 41.0  --   --     < > = values in this interval not displayed.     Recent Labs     03/23/20  1417 03/23/20  0421 03/22/20  0434 03/21/20  0414 03/21/20  0414   BUN 49.0* 49.0* 44.0*   < > 39.0*   Creatinine 1.7* 1.8* 1.7*   < > 1.5*   Sodium 142 143 143   < > 146*   Potassium 3.8 3.7 3.4*   < > 3.9   Magnesium  --  2.0 2.0  --  1.9   Calcium 8.4 8.9 8.3   < > 8.6   Chloride 100 103 104   < > 108   Glucose 145* 94 94   < > 103*   CO2 24 23 24    < > 24    < > = values in this interval not displayed.     No results for input(s): PHVEN, PHART, PCO2ART, PCO2VEN, HCO3ART, HCO3VEN in the last 72 hours.      No results found.      Patty Sermons, RN BSN  Utilization Review, [facility]  [address] [place], [place]  [address]  NPI:   [id number]  Tax ID:  [id number]   [phone] Confidential Voicemail.   For  Immediate assistance:  Case Management Office:  Ph: [phone]  Fax: [phone]  Please use fax number [phone] to provide authorization for hospital services or to request additional information. Thank you.

## 2020-03-23 NOTE — Progress Notes (Signed)
Pt received at 155 from CICU. Pt was transferred on CIPAP 90%, V paced, Foley for retention

## 2020-03-23 NOTE — Plan of Care (Signed)
Problem: Compromised Tissue integrity  Goal: Damaged tissue is healing and protected  Outcome: Progressing  Flowsheets (Taken 03/23/2020 2000)  Damaged tissue is healing and protected:   Monitor/assess Braden scale every shift   Provide wound care per wound care algorithm   Reposition patient every 2 hours and as needed unless able to reposition self  Goal: Nutritional status is improving  Outcome: Progressing  Flowsheets (Taken 03/23/2020 2000)  Nutritional status is improving:   Assist patient with eating   Allow adequate time for meals     Problem: Moderate/High Fall Risk Score >5  Goal: Patient will remain free of falls  Outcome: Progressing  Flowsheets (Taken 03/23/2020 2000)  VH High Risk (Greater than 13):   Use of floor mat   Keep door open for better visibility     Problem: Inadequate Gas Exchange  Goal: Adequate oxygenation and improved ventilation  Outcome: Progressing  Flowsheets (Taken 03/23/2020 2000)  Adequate oxygenation and improved ventilation:   Assess lung sounds   Monitor SpO2 and treat as needed   Position for maximum ventilatory efficiency  Goal: Patent Airway maintained  Outcome: Progressing  Flowsheets (Taken 03/23/2020 2000)  Patent airway maintained:   Position patient for maximum ventilatory efficiency   Reposition patient every 2 hours and as needed unless able to self-reposition     Problem: Inadequate Airway Clearance  Goal: Normal respiratory rate/effort achieved/maintained  Outcome: Progressing     Problem: Safety  Goal: Patient will be free from injury during hospitalization  Outcome: Progressing  Flowsheets (Taken 03/23/2020 2000)  Patient will be free from injury during hospitalization:   Assess patient's risk for falls and implement fall prevention plan of care per policy   Use appropriate transfer methods   Provide and maintain safe environment  Goal: Patient will be free from infection during hospitalization  Outcome: Progressing  Flowsheets (Taken 03/23/2020 2000)  Free from  Infection during hospitalization:   Assess and monitor for signs and symptoms of infection   Monitor lab/diagnostic results     Problem: Pain  Goal: Pain at adequate level as identified by patient  Outcome: Progressing  Flowsheets (Taken 03/23/2020 2000)  Pain at adequate level as identified by patient: Identify patient comfort function goal     Problem: Side Effects from Pain Analgesia  Goal: Patient will experience minimal side effects of analgesic therapy  Outcome: Progressing  Flowsheets (Taken 03/23/2020 2000)  Patient will experience minimal side effects of analgesic therapy:   Monitor/assess patient's respiratory status (RR depth, effort, breath sounds)   Assess for changes in cognitive function     Problem: Discharge Barriers  Goal: Patient will be discharged home or other facility with appropriate resources  Outcome: Progressing  Flowsheets (Taken 03/23/2020 2000)  Discharge to home or other facility with appropriate resources: Provide appropriate patient education     Problem: Psychosocial and Spiritual Needs  Goal: Demonstrates ability to cope with hospitalization/illness  Outcome: Progressing  Flowsheets (Taken 03/23/2020 2000)  Demonstrates ability to cope with hospitalizations/illness:   Encourage verbalization of feelings/concerns/expectations   Provide quiet environment     Problem: Compromised Hemodynamic Status  Goal: Vital signs and fluid balance maintained/improved  Outcome: Progressing  Flowsheets (Taken 03/23/2020 2000)  Vital signs and fluid balance are maintained/improved:   Position patient for maximum circulation/cardiac output   Monitor/assess vitals and hemodynamic parameters with position changes     Problem: Inadequate Tissue Perfusion-Venous  Goal: Tissue perfusion is adequate-venous  Outcome: Progressing  Flowsheets (Taken 03/23/2020 2000)  Tissue  perfusion is adequate-venous:   Increase activity as tolerated / progressive mobility   Elevate feet when in chair     Problem: Impaired  Mobility  Goal: Mobility/Activity is maintained at optimal level for patient  Outcome: Progressing  Flowsheets (Taken 03/23/2020 2000)  Mobility/activity is maintained at optimal level for patient:   Increase mobility as tolerated/progressive mobility   Encourage independent activity per ability   Assess for changes in respiratory status, level of consciousness and/or development of fatigue

## 2020-03-24 ENCOUNTER — Inpatient Hospital Stay: Payer: Medicare Other

## 2020-03-24 ENCOUNTER — Inpatient Hospital Stay (HOSPITAL_COMMUNITY): Payer: Medicare Other

## 2020-03-24 DIAGNOSIS — I272 Pulmonary hypertension, unspecified: Secondary | ICD-10-CM

## 2020-03-24 DIAGNOSIS — I50811 Acute right heart failure: Secondary | ICD-10-CM

## 2020-03-24 DIAGNOSIS — J9601 Acute respiratory failure with hypoxia: Secondary | ICD-10-CM | POA: Diagnosis not present

## 2020-03-24 DIAGNOSIS — I071 Rheumatic tricuspid insufficiency: Secondary | ICD-10-CM

## 2020-03-24 DIAGNOSIS — E8770 Fluid overload, unspecified: Secondary | ICD-10-CM

## 2020-03-24 DIAGNOSIS — I517 Cardiomegaly: Secondary | ICD-10-CM

## 2020-03-24 DIAGNOSIS — D72829 Elevated white blood cell count, unspecified: Secondary | ICD-10-CM

## 2020-03-24 DIAGNOSIS — I50810 Right heart failure, unspecified: Secondary | ICD-10-CM | POA: Diagnosis not present

## 2020-03-24 LAB — ECHOCARDIOGRAM ADULT LIMITED W CLR & DOPP WAVFRM LTD
IVS Diastolic Thickness (2D): 1.06
IVS Diastolic Thickness (2D): 1.25
LA Dimension (2D): 4.5
LVID diastole (2D): 3.69
LVID diastole (2D): 3.9
LVID systole (2D): 2.47
RV Systolic Pressure: 117.414
RV Systolic Pressure: 122
TAPSE: 0.798
TAPSE: 0.798

## 2020-03-24 LAB — BASIC METABOLIC PANEL
Anion Gap: 19 — ABNORMAL HIGH (ref 5.0–15.0)
Anion Gap: 19 — ABNORMAL HIGH (ref 5.0–15.0)
Anion Gap: 23 — ABNORMAL HIGH (ref 5.0–15.0)
BUN: 52 mg/dL — ABNORMAL HIGH (ref 7.0–19.0)
BUN: 57 mg/dL — ABNORMAL HIGH (ref 7.0–19.0)
BUN: 60 mg/dL — ABNORMAL HIGH (ref 7.0–19.0)
CO2: 23 mEq/L (ref 22–29)
CO2: 25 mEq/L (ref 22–29)
CO2: 21 mEq/L — ABNORMAL LOW (ref 22–29)
Calcium: 8.8 mg/dL (ref 7.9–10.2)
Calcium: 8.9 mg/dL (ref 7.9–10.2)
Calcium: 9 mg/dL (ref 7.9–10.2)
Chloride: 104 mEq/L (ref 100–111)
Chloride: 104 mEq/L (ref 100–111)
Chloride: 103 mEq/L (ref 100–111)
Creatinine: 1.6 mg/dL — ABNORMAL HIGH (ref 0.6–1.0)
Creatinine: 1.8 mg/dL — ABNORMAL HIGH (ref 0.6–1.0)
Creatinine: 1.9 mg/dL — ABNORMAL HIGH (ref 0.6–1.0)
Glucose: 88 mg/dL (ref 70–100)
Glucose: 110 mg/dL — ABNORMAL HIGH (ref 70–100)
Glucose: 144 mg/dL — ABNORMAL HIGH (ref 70–100)
Potassium: 3.8 mEq/L (ref 3.5–5.1)
Potassium: 4.2 mEq/L (ref 3.5–5.1)
Potassium: 4.5 mEq/L (ref 3.5–5.1)
Sodium: 146 mEq/L — ABNORMAL HIGH (ref 136–145)
Sodium: 148 mEq/L — ABNORMAL HIGH (ref 136–145)
Sodium: 147 mEq/L — ABNORMAL HIGH (ref 136–145)

## 2020-03-24 LAB — CBC AND DIFFERENTIAL
Absolute NRBC: 0.34 10*3/uL — ABNORMAL HIGH (ref 0.00–0.00)
Basophils Absolute Automated: 0.05 10*3/uL (ref 0.00–0.08)
Basophils Automated: 0.4 %
Eosinophils Absolute Automated: 0.08 10*3/uL (ref 0.00–0.44)
Eosinophils Automated: 0.6 %
Hematocrit: 31.9 % — ABNORMAL LOW (ref 34.7–43.7)
Hgb: 10.4 g/dL — ABNORMAL LOW (ref 11.4–14.8)
Immature Granulocytes Absolute: 0.25 10*3/uL — ABNORMAL HIGH (ref 0.00–0.07)
Immature Granulocytes: 1.8 %
Lymphocytes Absolute Automated: 1.36 10*3/uL (ref 0.42–3.22)
Lymphocytes Automated: 9.7 %
MCH: 31.9 pg (ref 25.1–33.5)
MCHC: 32.6 g/dL (ref 31.5–35.8)
MCV: 97.9 fL — ABNORMAL HIGH (ref 78.0–96.0)
MPV: 12.7 fL — ABNORMAL HIGH (ref 8.9–12.5)
Monocytes Absolute Automated: 0.99 10*3/uL — ABNORMAL HIGH (ref 0.21–0.85)
Monocytes: 7.1 %
Neutrophils Absolute: 11.29 10*3/uL — ABNORMAL HIGH (ref 1.10–6.33)
Neutrophils: 80.4 %
Nucleated RBC: 2.4 /100 WBC — ABNORMAL HIGH (ref 0.0–0.0)
Platelets: 200 10*3/uL (ref 142–346)
RBC: 3.26 10*6/uL — ABNORMAL LOW (ref 3.90–5.10)
RDW: 19 % — ABNORMAL HIGH (ref 11–15)
WBC: 14.02 10*3/uL — ABNORMAL HIGH (ref 3.10–9.50)

## 2020-03-24 LAB — URINALYSIS REFLEX TO MICROSCOPIC EXAM - REFLEX TO CULTURE
Bilirubin, UA: NEGATIVE
Glucose, UA: NEGATIVE
Ketones UA: NEGATIVE
Leukocyte Esterase, UA: NEGATIVE
Nitrite, UA: NEGATIVE
Protein, UR: 30 — AB
Specific Gravity UA: 1.014 (ref 1.001–1.035)
Urine pH: 5 (ref 5.0–8.0)
Urobilinogen, UA: NORMAL mg/dL (ref 0.2–2.0)

## 2020-03-24 LAB — LACTIC ACID, PLASMA
Lactic Acid: 2.5 mmol/L — ABNORMAL HIGH (ref 0.2–2.0)
Lactic Acid: 3.9 mmol/L — ABNORMAL HIGH (ref 0.2–2.0)

## 2020-03-24 LAB — PHOSPHORUS
Phosphorus: 4.7 mg/dL (ref 2.3–4.7)
Phosphorus: 4.8 mg/dL — ABNORMAL HIGH (ref 2.3–4.7)
Phosphorus: 5.4 mg/dL — ABNORMAL HIGH (ref 2.3–4.7)

## 2020-03-24 LAB — MAGNESIUM
Magnesium: 2.3 mg/dL (ref 1.6–2.6)
Magnesium: 2.2 mg/dL (ref 1.6–2.6)
Magnesium: 2.3 mg/dL (ref 1.6–2.6)

## 2020-03-24 LAB — B-TYPE NATRIURETIC PEPTIDE: B-Natriuretic Peptide: 1284 pg/mL — ABNORMAL HIGH (ref 0–100)

## 2020-03-24 LAB — COVID-19 (SARS-COV-2) & INFLUENZA  A/B, NAA (ROCHE LIAT)
Influenza A: NOT DETECTED
Influenza B: NOT DETECTED
SARS CoV 2 Overall Result: NOT DETECTED

## 2020-03-24 LAB — GFR
EGFR: 38
EGFR: 33.2
EGFR: 31.2

## 2020-03-24 LAB — PROCALCITONIN: Procalcitonin: 0.9 — ABNORMAL HIGH (ref 0.00–0.10)

## 2020-03-24 MED ORDER — DEXTROSE 50 % IV SOLN
12.5000 g | INTRAVENOUS | Status: DC | PRN
Start: 2020-03-24 — End: 2020-03-27

## 2020-03-24 MED ORDER — VANCOMYCIN PHARMACY TO DOSE PLACEHOLDER
INTRAVENOUS | Status: DC
Start: 2020-03-24 — End: 2020-03-26

## 2020-03-24 MED ORDER — MELATONIN 3 MG PO TABS
3.0000 mg | ORAL_TABLET | Freq: Every evening | ORAL | Status: DC | PRN
Start: 2020-03-24 — End: 2020-03-26
  Administered 2020-03-25: 3 mg via ORAL
  Filled 2020-03-24: qty 1

## 2020-03-24 MED ORDER — VANCOMYCIN ORAL SOLUTION 50 MG/ML UNIT DOSE
125.0000 mg | Freq: Four times a day (QID) | ORAL | Status: DC
Start: 2020-03-24 — End: 2020-03-25
  Administered 2020-03-24 – 2020-03-25 (×4): 125 mg via ORAL
  Filled 2020-03-24 (×10): qty 2.5

## 2020-03-24 MED ORDER — ALBUTEROL SULFATE (2.5 MG/3ML) 0.083% IN NEBU
2.5000 mg | INHALATION_SOLUTION | Freq: Four times a day (QID) | RESPIRATORY_TRACT | Status: DC
Start: 2020-03-24 — End: 2020-03-25
  Administered 2020-03-24 – 2020-03-25 (×6): 2.5 mg via RESPIRATORY_TRACT
  Filled 2020-03-24 (×5): qty 3

## 2020-03-24 MED ORDER — VANCOMYCIN 1000 MG IN 250 ML NS IVPB VIAL-MATE (CNR)
1000.0000 mg | Freq: Once | INTRAVENOUS | Status: AC
Start: 2020-03-24 — End: 2020-03-24
  Administered 2020-03-24: 09:00:00 1000 mg via INTRAVENOUS
  Filled 2020-03-24: qty 250

## 2020-03-24 MED ORDER — CHLOROTHIAZIDE SODIUM 500 MG IV SOLR
500.0000 mg | Freq: Once | INTRAVENOUS | Status: AC
Start: 2020-03-24 — End: 2020-03-24
  Administered 2020-03-24: 20:00:00 500 mg via INTRAVENOUS
  Filled 2020-03-24: qty 500

## 2020-03-24 MED ORDER — POTASSIUM CHLORIDE 10 MEQ/100ML IV SOLN (WRAP)
10.0000 meq | INTRAVENOUS | Status: AC
Start: 2020-03-24 — End: 2020-03-24
  Administered 2020-03-24 (×2): 10 meq via INTRAVENOUS
  Filled 2020-03-24: qty 100

## 2020-03-24 MED ORDER — GLUCAGON 1 MG IJ SOLR (WRAP)
1.0000 mg | INTRAMUSCULAR | Status: DC | PRN
Start: 2020-03-24 — End: 2020-03-27

## 2020-03-24 MED ORDER — BUMETANIDE 0.25 MG/ML IJ SOLN
4.0000 mg | Freq: Once | INTRAMUSCULAR | Status: AC
Start: 2020-03-24 — End: 2020-03-24
  Administered 2020-03-24: 17:00:00 4 mg via INTRAVENOUS
  Filled 2020-03-24: qty 16

## 2020-03-24 MED ORDER — LIDOCAINE 5 % EX PTCH
1.0000 | MEDICATED_PATCH | CUTANEOUS | Status: DC
Start: 2020-03-24 — End: 2020-03-26
  Administered 2020-03-24 – 2020-03-25 (×2): 1 via TRANSDERMAL
  Filled 2020-03-24 (×2): qty 1

## 2020-03-24 MED ORDER — SODIUM CHLORIDE 0.9 % IV MBP
500.0000 mg | Freq: Three times a day (TID) | INTRAVENOUS | Status: DC
Start: 2020-03-24 — End: 2020-03-26
  Administered 2020-03-24 – 2020-03-26 (×7): 500 mg via INTRAVENOUS
  Filled 2020-03-24 (×7): qty 0.5

## 2020-03-24 MED ORDER — GLUCOSE 40 % PO GEL
15.0000 g | ORAL | Status: DC | PRN
Start: 2020-03-24 — End: 2020-03-27
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
## 2020-03-24 NOTE — Nursing Progress Note (Signed)
Pt Aox4, anxious, MAE, FC, generalized weakness and SOB upon any exertion. Pt complains of pleuritic chest pain, tylenol given, provider aware. PERRLA. Pt normotensive, A/V paced, NSR to slightly brady. Generalized edema, +1 to +2, +pulses, afibrile. Pt on Bipap sating in the high 90s, switched to HFNC for approx 3 hours, once tired pt switched back to Bipap for the night with addition of nitric 10. Pt desaturates quickly with removal of Bipap or HFNC. Only removed to admin meds for approx 30sec and pt desaturated to 60s. Pt diminished, spontaneous cough, weak, productive (tan/pink tinged), provider aware. Pt on bumex drip, I&O calculated and documented, urine dark yellow/clear, output for day shift and input . No BM, bowel sounds active. Skin intact, just generalized bruising and scattered scars. Echo and multiple cxrays complete. Blood cultures drawn, pending results. Pt COVID/Flu (-), trending lactic (increasing throughout day shift). Provider aware. Trending BMPs. Pt difficult stick, may place midline depending on results of blood cultures. Full report given to night RN. Patient resting, with no currently SOB.

## 2020-03-24 NOTE — Consults (Signed)
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 She presented on 12/09 for elective upgrade to dual chamber pacing system however subsequently suffered hypoxemic respiratory failure necessitating BiPAP use.     Patient reports being functionally limited for years but progressed since contracting COVID in March. She occasionally experience palpitations and exertional presyncope but denies LOC. Has had longstanding history of atrial fibrillation with multiple ablations, she is unsure if she really felt well after ablations.     PMH/SHx as above    SocHx:  Lives in first floor apartment in [place]  Tobacco: former, quit around 2001  EtOH: denies  Illicit: denies    FMHx:  Mother: Alzheimer's  Father: died from lung cancer  Sisters x2: one with Goodpasture's syndrome, one with Lupus  Children x2    BP 134/78    Pulse 69    Temp 97.6 F (36.4 C) (Axillary)    Resp (!) 25    Ht 1.613 m (5' 3.5")    Wt 77.4 kg (170 lb 10.2 oz)    SpO2 96%    BMI 29.75 kg/m   I/O: 493/ 1395, N -    Telemetry: atrial flutter with Vp rate at 69 bpm. 7, 13 and 15 beats of MMVT,     Constitutional: pleasant woman on BiPAP in moderate respiratory distress.  Eyes: pupils equal, round; sclera anicteric.  HEENT: dry mucous membranes  Neck: JVP 12cmh20 with v waves to mandible.  Cardiovascular: normal rate and regular rhythm; 3/6 SEM, no rubs, or gallops, +RV heave, dorsal pedal pulses 2+ and symmetric; no extremity edema.  Respiratory: tachypneic, speaking in 1 word sentences, mech BS bilat  Gastrointestinal: abdomen soft and non-tender.  Extremities: no acrocyanosis.  Skin: no rash or lesions on exposed skin.  Neurologic: alert; MAETC, cranial nerves grossly intact.  Psychiatric: normal mood and affect.    Lab Results    Component Value Date    WBC 14.02 (H) 03/24/2020    HGB 10.4 (L) 03/24/2020    HCT 31.9 (L) 03/24/2020    MCV 97.9 (H) 03/24/2020    PLT 200 03/24/2020     Recent Labs   Lab 03/24/20  1725   Sodium 147*   Potassium 4.5   Chloride 103   CO2 21*   BUN 60.0*   Creatinine 1.9*   EGFR 31.2   Glucose 144*   Calcium 9.0     Recent Labs   Lab 03/20/20  0347   Bilirubin, Total 1.1   Bilirubin Direct 0.4   Protein, Total 6.3   Albumin 2.7*   ALT 22   AST (SGOT) 24     Lab Results   Component Value Date    TSH 2.25 02/01/2020       TTE 02/02/20, which I independently reviewed and interpreted, found mildly impaired LV function with apical akinesis/aneurysm, LVH, mildly impaired RV function, left more than right atrial enlargement, mild TR, dilated but collapsible IVC, and estimated PASP 85-90 mmHg.      Right heart catheterization 01/24/20  Baseline RA 10, PCWP 18 end-expiration, PA 78/37/51, TPG 33, CO/CI/PVR Fick 3.12/1.68/10.6, thermal 1.9/1.0/17.4.   Exercise PCWP 26, PA 93/47/62.  Systemic BP ranged 127-150/64-87, HR 70, SpO2 98-100% on 6 L NC    Right heart catheterization 09/13/19 RA 6,  PA 56/23/34, PCWP 13, TPG 21, CO/CI/PVR Fick 3.49/1.89/6.0, thermal 4.26/2.3/4.9.    Right heart catheterization 06/10/17 RA 6, PA 49/16/29, PCWP 10, Fick CI 1.9 PVR 5.3, thermal CI 1.4 PVR 7.3 on macitentan 10 and selexipag 1000 bid.    Right heart catheterization 06/26/16 RA 4, PA 69/23/38, PCWP 8, TPG 30, Fick CO 4.15 and PVR 7.3, thermal CO 3.14 and PVR 9.6    Cardiac MRI 09/20/19  1.  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  Would also consider coronary disease with previous apical infarction is another possible etiology.  2.  Moderately reduced left ventricular function with apical akinesis. LVEF 35%.   3.  The right ventricle is normal in side with mildly reduced function. RVEF 40%.   4.  The left atrium is moderately dilated.   5.  The  main pulmonary artery is mildly dilated.   6.  Transmural enhancement of the left ventricular apex.  May represent scar formation in the setting of apical hypertrophic cardiomyopathy versus prior apical infarct.      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 Advanced heart failure is re-engaged for symptom management for acute on chronic hypoxemia. Unfortunately pulmonary vasodilator therapy has been limited by hypotension. Generally tenets of therapy for acute on chronic group 2 PAH contributing to pulmonary edema is combination of afterload reduction and diuretics. Limitations as specified above greatly hamper efforts on afterload reduction. Patient states she has two goals and would be satisfied if either are met, (1) she wants to go home, (2) she wants hospice. As such, would defer to very capable palliative care service in these regards. Will sign off at this time.    Time spent during this encounter 35 min. More than 50% of the visit was spent in counseling and/or coordination of care.

## 2020-03-24 NOTE — Progress Notes (Signed)
Plan of care note    Dr. Netta Cedars and I reviewed the patient's chart including imaging, labs, and recent changes over the past 24 hours including need for BiPAP therapy and increase diuresis with initiation of continuous infusion of Bumex.  Patient has had improving creatinine, decreasing BNP, and a significant negative volume balance.  At this time, agree with the plan of care with continued diuresis.  Given the left-sided diastolic dysfunction, would not start patient on further pulmonary vasodilator therapy as this can lead to potentially worsening shock.    Rosalyn Gess, M.D.  [facility]  [address]  [place], [place] [address]  Spectra: [phone]  Pager: [phone]  Fax: [phone]  E-mail: [email or web address]

## 2020-03-24 NOTE — Progress Notes (Signed)
[facility]- Medical Critical Care Service [facility])      INTERMEDIATE CARE PROGRESS NOTE    Date Time: 03/24/20 12:34 PM  Patient Name: [name],[name]  Attending Physician: [name]*  Primary Care Physician: Diane Lea, MD  Location/Room: W960/A540.98     Assessment:    [name] is a 74 y.o. 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 She was transferred to [facility] on 12/16. Her hypoxia worsened requiring BiPAP and a bumex infusion for volume overload.     Problem List:   Problem List:   Patient Active Problem List   Diagnosis   . DOE (dyspnea on exertion)   . OSA (obstructive sleep apnea)   . Apical variant hypertrophic cardiomyopathy   . Hypothyroidism   . Pacemaker   . Pulmonary hypertension, primary   . Patient is Jehovah's Witness   . Pacemaker generator end of life   . Encounter for pacemaker at end of battery life       Last 24 hours:    worsening dyspnea requiring BiPAP. Increased diuresis and now on a bumex infusion. Negative 2.7L last 24 hours but symptoms unchanged.     Hospital Course:    12/9: developed acute on chronic hypoxic respiratory failure during extubation after elective upgrade of her pacemaker from single to dual chamber.  12/10: extubated and placed on HFNC with iNO.  12/11: started on antibiotics for possible pneumonia.   12/14: weaned off iNO   12/16: transferred to [facility]  12/17: dyspnea unchanged. Pain in L toe and ankle concerning for gout w/ + uric acid. Start steroids, colchicine    Plan:    NEURO:  -  Gout L foot/ankle. On steroids, colchicine with improvement  - pain well controlled at present    CARDIAC:  - acute RV failure this admission. Known HOCM and severe pulmonary hypertension. In setting of worsening hypoxia await repeat echo today to determine no deteriorations in LV or RV function. Heart failure team aware and will eval patient to see if they have anything else to offer. Given RV dilation will continue to diurese with bumex gtt but be careful not to over-shoot in setting in HOCM. Continue midodrine   - AF. Remains rate controlled. ? Concern for PE given worsened hypoxia but has been on eliquis since admission.     PULM:  - acute hypoxic respiratory failure. Worsened yesterday evening requiring BiPAP. CXR today unchanged as listed below. Currently on BiPAP 100% 10/5 with O2 sat 99%. Goal O2 sat >90%. Attempt to wean down as tolerated by work of breathing and O2 sats. Attempt to get back on HFNC if will tolerate.   - pulmonary hypertension. [facility] follows. Continue uptravi 1200 mcg q12h. Per their reccs, no benefit to increasing dosage as will put more pressure on LV which has some degree of diastolic dysfunction.   - continue nebulizers q6h    GI:  - GI prophylaxis: famotidine daily  - Nutrition: currently NPO while on BiPAP. If able to transition back to HFNC will attempt to feed for patient's comfort  - diarrhea. No pericolace given in several  days. Will send c diff. Start PO vancomycin for now and place on isolation    RENAL:  - volume overload. Started bumex gtt @ 1 overnight and negative 2.7 liters in last 24 hours. Oxygenation unchanged despite aggressive diuresis. Bedside ultrasound confirmed IVC still plump. Continue bumex at current rate for now and ensure remains negative on I/O's. Cr trending down from 1.8 to 1.6. baseline 1.0. Will monitor BMP q6h to follow creatinine and electrolytes. Foley remains. Strict intake and output    ID:   - afebrile, but leukocytosis worsening. Finished 7  day course of zosyn for presumed pneumonia and all cultures remain no growth to date. procalcitonin today unchanged from previous at 0.9. pan cultured. Antibiotics broadened to IV vancomycin, meropenem. Also having diarrhea. Start PO vancomycin and r/o c. Diff. Fully vaccinated, due for COVID booster 04/2020. COVID and flu swab pending. CXR essentially unchanged diffuse opacities, pulmonary edema, atelectasis.   - lactic acid up to 2.6. will trend q6h     HEME:  - SCDs  - Pharmacologic prophylaxis: on eliquis for PMH AF  - hgb/hct stable     ENDO/RHEUM:  - Glucose goal: 100-180  - not currently requiring additional insulin. Monitor for hypoglycemia while NPO   - hypothyroidism. Continue home dose levothyroxine     LINES/FOLEY:  - peripheral IV x 2  - foley catheter x 7 days    CODE STATUS: NO CPR - SUPPORT OK  Patient is currently able to make medical decisions.      Patients designated proxy is her daughter [name] who was present.    Discussed current diagnoses, prognosis and goals of care. Stated we are tackling her dyspnea from all angles and treating what we can. If we get to a point where we have nothing left to offer, we will make them aware so they can further discuss if they would want to transition onto comfort care. The patient and family agree with this plan.     CONSULTANTS:   - advanced lung  - palliative care  - heart failure/transplant    DISPO:    - keep in [facility] for now    Review of Systems:   Review of Systems - states work of breathing greatly improved while on BiPAP. Patient is hungry     Physical Exam:   Temp:  [97.2 F (36.2 C)-98.2 F (36.8 C)] 97.2 F (36.2 C)  Heart Rate:  [69-73] 69  Resp Rate:  [20-41] 33  BP: (106-129)/(55-77) 129/66  FiO2:  [90 %-100 %] 100 %    BP 129/66   Pulse 69   Temp 97.2 F (36.2 C)   Resp (!) 33   Ht 1.613 m (5' 3.5")   Wt 77.4 kg (170 lb 10.2 oz)   SpO2 99%   BMI 29.75 kg/m      General Appearance:  Awake, alert. Tachypneic.   Mental status: alert and  oriented to person, place, and time  Neuro:  Equal strength in upper and lower extremities bilaterally  HEENT: supple, no lymphadenopathy.   Pulmonary: diffuse crackles bilaterally  Cardiac: irregularly irregular, no murmurs, rubs, or gallops  Abdomen:  Positive bowel sounds x 4, non-tender, non-distended  Extremities: warm, trace non-pitting lower extremity edema bilaterally. 1+ DP/PT bilaterally  Skin:   Warm, dry. No visible lesions or rashes noted    Labs:     Results     Procedure Component Value Units Date/Time    COVID-19 (SARS-CoV-2) and Influenza A/B, NAA (Liat  Rapid) [[id number]] Collected: 03/24/20 1222    Specimen: Culturette from Nasopharyngeal Updated: 03/24/20 1224    Narrative:      o Collect and clearly label specimen type:  o PREFERRED-Upper respiratory specimen: One Nasopharyngeal  Swab in Transport Media.  o Hand deliver to laboratory ASAP  Diagnostic -PUI    Lactic Acid [[id number]]  (Abnormal) Collected: 03/24/20 1005    Specimen: Blood Updated: 03/24/20 1020     Lactic Acid 2.5 mmol/L     CULTURE BLOOD AEROBIC AND ANAEROBIC [[id number]] Collected: 03/24/20 1005    Specimen: Blood, Venipuncture Updated: 03/24/20 1005    Narrative:      The order will result in two separate 8-68ml bottles  Please do NOT order repeat blood cultures if one has been  drawn within the last 48 hours  UNLESS concerned for  endocarditis  AVOID BLOOD CULTURE DRAWS FROM CENTRAL LINE IF POSSIBLE  Indications:->Pneumonia  1 BLUE+1 PURPLE    Procalcitonin [[id number]]  (Abnormal) Collected: 03/24/20 0233     Updated: 03/24/20 0718     Procalcitonin 0.90    B-type Natriuretic Peptide [[id number]]  (Abnormal) Collected: 03/24/20 0233    Specimen: Blood Updated: 03/24/20 0328     B-Natriuretic Peptide 1,284 pg/mL     Phosphorus [[id number]] Collected: 03/24/20 0233    Specimen: Blood Updated: 03/24/20 0324     Phosphorus 4.7 mg/dL     GFR [[id number]] Collected: 03/24/20 0233     Updated: 03/24/20 0324     EGFR 38.0    Basic  Metabolic Panel [[id number]]  (Abnormal) Collected: 03/24/20 0233    Specimen: Blood Updated: 03/24/20 0324     Glucose 88 mg/dL      BUN 75.6 mg/dL      Creatinine 1.6 mg/dL      Calcium 8.8 mg/dL      Sodium 433 mEq/L      Potassium 3.8 mEq/L      Chloride 104 mEq/L      CO2 23 mEq/L      Anion Gap 19.0    Magnesium [[id number]] Collected: 03/24/20 0233    Specimen: Blood Updated: 03/24/20 0324     Magnesium 2.3 mg/dL     CBC and differential [[id number]]  (Abnormal) Collected: 03/24/20 0233    Specimen: Blood Updated: 03/24/20 0312     WBC 14.02 x10 3/uL      Hgb 10.4 g/dL      Hematocrit 09.3 %      Platelets 200 x10 3/uL      RBC 3.26 x10 6/uL      MCV 97.9 fL      MCH 31.9 pg      MCHC 32.6 g/dL      RDW 19 %      MPV 12.7 fL      Neutrophils 80.4 %      Lymphocytes Automated 9.7 %      Monocytes 7.1 %      Eosinophils Automated 0.6 %      Basophils Automated 0.4 %      Immature Granulocytes 1.8 %      Nucleated RBC 2.4 /100 WBC      Neutrophils Absolute 11.29 x10 3/uL      Lymphocytes Absolute Automated 1.36 x10 3/uL      Monocytes Absolute Automated 0.99 x10 3/uL      Eosinophils Absolute Automated 0.08 x10 3/uL      Basophils Absolute Automated 0.05 x10 3/uL      Immature Granulocytes Absolute  0.25 x10 3/uL      Absolute NRBC 0.34 x10 3/uL     Basic Metabolic Panel [[id number]]  (Abnormal) Collected: 03/23/20 1417    Specimen: Blood Updated: 03/23/20 1536     Glucose 145 mg/dL      BUN 16.1 mg/dL      Creatinine 1.7 mg/dL      Calcium 8.4 mg/dL      Sodium 096 mEq/L      Potassium 3.8 mEq/L      Chloride 100 mEq/L      CO2 24 mEq/L      Anion Gap 18.0    GFR [[id number]] Collected: 03/23/20 1417     Updated: 03/23/20 1536     EGFR 35.5          Radiology / Imaging:     XR Chest AP Portable   Final Result      1. No significant change diffuse opacities or edema and mild left   basilar atelectasis.      Clide Cliff, MD    03/24/2020 8:37 AM      XR Chest AP Portable   Final Result     CHF      Gerlene Burdock, MD     03/23/2020 6:27 PM      CT Chest WO Contrast   Final Result      1. New smooth interstitial thickening, groundglass and trace effusions   favoring congestion. 4 mm subpleural right lower lobe nodule needs no   additional follow-up in the absence of known malignancy or significant   risk factors.   2. Stable mediastinal adenopathy.      Bosie Helper, MD    03/21/2020 11:42 AM      XR Chest AP Portable   Final Result    Persistent, potentially increasing moderate cardiomegaly   and/or pericardial effusion. Unchanged moderate diffuse bilateral   interstitial lung opacities. Remainder as above.      Wilmon Pali, MD    03/21/2020 7:19 AM      XR Chest AP Portable   Final Result    Slightly decreasing vascular congestion and interstitial   edema. Remainder as above.      Wilmon Pali, MD    03/20/2020 7:47 AM      XR Chest AP Portable   Final Result   Findings of pulmonary edema, bilateral pleural effusions and   multifocal airspace opacity.      Colonel Bald, MD    03/19/2020 8:53 AM      XR Chest AP Portable   Final Result     CHF      Gerlene Burdock, MD    03/18/2020 8:17 AM      XR Chest AP Portable   Final Result   . Improved edema post extubation       Marty Heck, MD    03/17/2020 12:19 PM      Echocardiogram Adult W Contrast Complete W Color Doppler   Final Result      CT Head WO Contrast   Final Result    CT scan of the head shows no hydrocephalus, herniation, or   intracranial hemorrhage. There is no visible acute intracranial   abnormality.      Miguel Dibble, MD    03/16/2020 5:11 AM      XR Chest AP Portable   Final Result      1. Cardiac enlargement and increased vascular congestion   2. Bibasilar opacities and increased lingular opacities,  likely   atelectasis      Genelle Bal    04-01-2020 8:17 PM      Echocardiogram [employer] [facility] [employer] [employer]    (Results Pending)       I have spent 55 minutes providing critical care of this patient with the following problems: acute hypoxic respiratory failure, RV failure, HOCM,  pneumonia, atrial fibrillation. >50% of that time was spent in counseling and coordination of care. Any care time performed today is exclusive of teaching, billable procedures, and not overlapping with any other providers.    Signed by: Tracie Harrier, PA-C  03/24/2020 12:34 PM  ZO:[id number], Despina Arias, MD

## 2020-03-24 NOTE — Plan of Care (Signed)
Problem: Compromised Tissue integrity  Goal: Damaged tissue is healing and protected  Outcome: Progressing  Flowsheets (Taken 03/24/2020 0034)  Damaged tissue is healing and protected:   Monitor/assess Braden scale every shift   Provide wound care per wound care algorithm   Reposition patient every 2 hours and as needed unless able to reposition self   Increase activity as tolerated/progressive mobility   Use bath wipes, not soap and water, for daily bathing   Relieve pressure to bony prominences for patients at moderate and high risk   Keep intact skin clean and dry   Use incontinence wipes for cleaning urine, stool and caustic drainage. Foley care as needed   Monitor external devices/tubes for correct placement to prevent pressure, friction and shearing   Encourage use of lotion/moisturizer on skin  Goal: Nutritional status is improving  Outcome: Progressing  Flowsheets (Taken 03/24/2020 0034)  Nutritional status is improving:   Allow adequate time for meals   Encourage patient to take dietary supplement(s) as ordered     Problem: Moderate/High Fall Risk Score >5  Goal: Patient will remain free of falls  Outcome: Progressing  Flowsheets (Taken 03/23/2020 2000)  High (Greater than 13):   HIGH-Visual cue at entrance to patient's room   HIGH-Bed alarm on at all times while patient in bed   HIGH-Initiate use of floor mats as appropriate   HIGH-Consider use of low bed     Problem: Compromised Hemodynamic Status  Goal: Vital signs and fluid balance maintained/improved  Outcome: Progressing  Flowsheets (Taken 03/24/2020 0034)  Vital signs and fluid balance are maintained/improved:   Position patient for maximum circulation/cardiac output   Monitor/assess vitals and hemodynamic parameters with position changes   Monitor and compare daily weight   Monitor intake and output. Notify LIP if urine output is less than 30 mL/hour.

## 2020-03-25 ENCOUNTER — Inpatient Hospital Stay: Payer: Medicare Other

## 2020-03-25 DIAGNOSIS — I50813 Acute on chronic right heart failure: Secondary | ICD-10-CM

## 2020-03-25 LAB — BASIC METABOLIC PANEL
Anion Gap: 21 — ABNORMAL HIGH (ref 5.0–15.0)
Anion Gap: 22 — ABNORMAL HIGH (ref 5.0–15.0)
Anion Gap: 22 — ABNORMAL HIGH (ref 5.0–15.0)
Anion Gap: 24 — ABNORMAL HIGH (ref 5.0–15.0)
BUN: 63 mg/dL — ABNORMAL HIGH (ref 7.0–19.0)
BUN: 68 mg/dL — ABNORMAL HIGH (ref 7.0–19.0)
BUN: 74 mg/dL — ABNORMAL HIGH (ref 7.0–19.0)
BUN: 75 mg/dL — ABNORMAL HIGH (ref 7.0–19.0)
CO2: 22 mEq/L (ref 22–29)
CO2: 23 mEq/L (ref 22–29)
CO2: 24 mEq/L (ref 22–29)
CO2: 28 mEq/L (ref 22–29)
Calcium: 9 mg/dL (ref 7.9–10.2)
Calcium: 9 mg/dL (ref 7.9–10.2)
Calcium: 9.2 mg/dL (ref 7.9–10.2)
Calcium: 9.2 mg/dL (ref 7.9–10.2)
Chloride: 100 mEq/L (ref 100–111)
Chloride: 103 mEq/L (ref 100–111)
Chloride: 103 mEq/L (ref 100–111)
Chloride: 103 mEq/L (ref 100–111)
Creatinine: 1.7 mg/dL — ABNORMAL HIGH (ref 0.6–1.0)
Creatinine: 1.8 mg/dL — ABNORMAL HIGH (ref 0.6–1.0)
Creatinine: 2.1 mg/dL — ABNORMAL HIGH (ref 0.6–1.0)
Creatinine: 2.1 mg/dL — ABNORMAL HIGH (ref 0.6–1.0)
Glucose: 101 mg/dL — ABNORMAL HIGH (ref 70–100)
Glucose: 113 mg/dL — ABNORMAL HIGH (ref 70–100)
Glucose: 155 mg/dL — ABNORMAL HIGH (ref 70–100)
Glucose: 165 mg/dL — ABNORMAL HIGH (ref 70–100)
Potassium: 3.6 mEq/L (ref 3.5–5.1)
Potassium: 3.9 mEq/L (ref 3.5–5.1)
Potassium: 4.2 mEq/L (ref 3.5–5.1)
Potassium: 4.8 mEq/L (ref 3.5–5.1)
Sodium: 147 mEq/L — ABNORMAL HIGH (ref 136–145)
Sodium: 149 mEq/L — ABNORMAL HIGH (ref 136–145)
Sodium: 149 mEq/L — ABNORMAL HIGH (ref 136–145)
Sodium: 150 mEq/L — ABNORMAL HIGH (ref 136–145)

## 2020-03-25 LAB — CBC AND DIFFERENTIAL
Absolute NRBC: 0.75 10*3/uL — ABNORMAL HIGH (ref 0.00–0.00)
Basophils Absolute Automated: 0.06 10*3/uL (ref 0.00–0.08)
Basophils Automated: 0.3 %
Eosinophils Absolute Automated: 0.02 10*3/uL (ref 0.00–0.44)
Eosinophils Automated: 0.1 %
Hematocrit: 37.6 % (ref 34.7–43.7)
Hgb: 11.9 g/dL (ref 11.4–14.8)
Immature Granulocytes Absolute: 0.3 10*3/uL — ABNORMAL HIGH (ref 0.00–0.07)
Immature Granulocytes: 1.3 %
Lymphocytes Absolute Automated: 0.98 10*3/uL (ref 0.42–3.22)
Lymphocytes Automated: 4.4 %
MCH: 31.6 pg (ref 25.1–33.5)
MCHC: 31.6 g/dL (ref 31.5–35.8)
MCV: 99.7 fL — ABNORMAL HIGH (ref 78.0–96.0)
Monocytes Absolute Automated: 1.48 10*3/uL — ABNORMAL HIGH (ref 0.21–0.85)
Monocytes: 6.6 %
Neutrophils Absolute: 19.47 10*3/uL — ABNORMAL HIGH (ref 1.10–6.33)
Neutrophils: 87.3 %
Nucleated RBC: 3.4 /100 WBC — ABNORMAL HIGH (ref 0.0–0.0)
Platelets: 224 10*3/uL (ref 142–346)
RBC: 3.77 10*6/uL — ABNORMAL LOW (ref 3.90–5.10)
RDW: 20 % — ABNORMAL HIGH (ref 11–15)
WBC: 22.31 10*3/uL — ABNORMAL HIGH (ref 3.10–9.50)

## 2020-03-25 LAB — MAGNESIUM
Magnesium: 2.2 mg/dL (ref 1.6–2.6)
Magnesium: 2.3 mg/dL (ref 1.6–2.6)
Magnesium: 2.4 mg/dL (ref 1.6–2.6)
Magnesium: 2.6 mg/dL (ref 1.6–2.6)

## 2020-03-25 LAB — APTT: PTT: 42 s — ABNORMAL HIGH (ref 27–39)

## 2020-03-25 LAB — PHOSPHORUS
Phosphorus: 5.6 mg/dL — ABNORMAL HIGH (ref 2.3–4.7)
Phosphorus: 5.6 mg/dL — ABNORMAL HIGH (ref 2.3–4.7)
Phosphorus: 5.8 mg/dL — ABNORMAL HIGH (ref 2.3–4.7)
Phosphorus: 6 mg/dL — ABNORMAL HIGH (ref 2.3–4.7)

## 2020-03-25 LAB — VANCOMYCIN, RANDOM: Vancomycin Random: <3 ug/mL

## 2020-03-25 LAB — LACTIC ACID, PLASMA
Lactic Acid: 3 mmol/L — ABNORMAL HIGH (ref 0.2–2.0)
Lactic Acid: 3.9 mmol/L — ABNORMAL HIGH (ref 0.2–2.0)
Lactic Acid: 4.5 mmol/L (ref 0.2–2.0)

## 2020-03-25 LAB — GFR
EGFR: 27.8
EGFR: 27.8
EGFR: 33.2
EGFR: 35.5

## 2020-03-25 LAB — ANTI-XA,UFH: Anti-Xa, UFH: >2

## 2020-03-25 LAB — B-TYPE NATRIURETIC PEPTIDE: B-Natriuretic Peptide: 1145 pg/mL — ABNORMAL HIGH (ref 0–100)

## 2020-03-25 MED ORDER — FENTANYL CITRATE (PF) 50 MCG/ML IJ SOLN (WRAP)
50.0000 ug | INTRAMUSCULAR | Status: DC | PRN
Start: 2020-03-25 — End: 2020-03-26
  Administered 2020-03-25 – 2020-03-26 (×4): 50 ug via INTRAVENOUS
  Filled 2020-03-25 (×4): qty 2

## 2020-03-25 MED ORDER — POTASSIUM CHLORIDE 10 MEQ/100ML IV SOLN (WRAP)
10.0000 meq | Freq: Once | INTRAVENOUS | Status: AC
Start: 2020-03-25 — End: 2020-03-25
  Administered 2020-03-25: 20:00:00 10 meq via INTRAVENOUS

## 2020-03-25 MED ORDER — HEPARIN (PORCINE) IN D5W 50-5 UNIT/ML-% IV SOLN (UNITS/KG/HR ONLY)
18.0000 [IU]/kg/h | INTRAVENOUS | Status: DC
Start: 2020-03-25 — End: 2020-03-25

## 2020-03-25 MED ORDER — CHLOROTHIAZIDE SODIUM 500 MG IV SOLR
500.0000 mg | Freq: Once | INTRAVENOUS | Status: AC
Start: 2020-03-25 — End: 2020-03-25
  Administered 2020-03-25: 12:00:00 500 mg via INTRAVENOUS
  Filled 2020-03-25: qty 500

## 2020-03-25 MED ORDER — POTASSIUM CHLORIDE 10 MEQ/100ML IV SOLN (WRAP)
10.0000 meq | INTRAVENOUS | Status: AC
Start: 2020-03-25 — End: 2020-03-25
  Administered 2020-03-25 (×4): 10 meq via INTRAVENOUS
  Filled 2020-03-25 (×4): qty 100

## 2020-03-25 MED ORDER — HEPARIN (PORCINE) IN D5W 50-5 UNIT/ML-% IV SOLN (UNITS/KG/HR ONLY)
18.0000 [IU]/kg/h | INTRAVENOUS | Status: DC
Start: 2020-03-25 — End: 2020-03-26

## 2020-03-25 MED ORDER — ALBUTEROL-IPRATROPIUM 2.5-0.5 (3) MG/3ML IN SOLN
3.0000 mL | RESPIRATORY_TRACT | Status: DC
Start: 2020-03-25 — End: 2020-03-26
  Administered 2020-03-25 – 2020-03-26 (×4): 3 mL via RESPIRATORY_TRACT
  Filled 2020-03-25 (×4): qty 3

## 2020-03-25 MED ORDER — FENTANYL CITRATE (PF) 50 MCG/ML IJ SOLN (WRAP)
25.0000 ug | INTRAMUSCULAR | Status: DC | PRN
Start: 2020-03-25 — End: 2020-03-25
  Administered 2020-03-25 (×2): 25 ug via INTRAVENOUS
  Filled 2020-03-25 (×2): qty 2

## 2020-03-25 NOTE — Consults (Signed)
On floor to place Midline. Patient refused. Please reorder if patient changes her mind.

## 2020-03-25 NOTE — Progress Notes (Signed)
[facility] Nursing Progress Note    [name] [name] is a 74 y.o. female  Admitted 03/14/2020 12:11 PM [facility] day 9)    Indication for continued [facility] status:  Hemodynamic instability.    Mews Score:  2    Review of Systems  Neuro:  Alert and oriented x4.  FC and MAE.  Fentanyl x2 for pain in chest.    Cardiac:  A paced, V paced: rate at 69  Systolic BP: 110s to 120s.  Pulses are palpable.  No fever noted    Respiratory:  Patient is on Bipab.  Dyspnea at rest and with exertion.    GI/GU:  NPO    BM this shift?    No    Skin Assessment  Skin Integrity: Bruising  Abrasion Skin Location: L chest - pacemaker incision  Bruising Skin Location: Scattered    Braden Score: Braden Scale Score: 12 (03/25/20 0800)    LDAWs  Patient Lines/Drains/Airways Status     Active Lines, Drains and Airways     Name Placement date Placement time Site Days    Peripheral IV 03/16/20 18 G Anterior;Right Upper Arm 03/16/20  1829  Upper Arm  9    Peripheral IV 03/23/20 22 G Left;Posterior Hand 03/23/20  1933  Hand  1    Urethral Catheter Latex 14 Fr. 03/17/20  0223  Latex  8               Wound 04/06/2020 Surgical Incision Chest Left;Upper Incision for PPM atrial lead upgrade & gen change (Active)   Date First Assessed/Time First Assessed: 03/09/2020 1705   Wound Type: Surgical Incision  Location: Chest  Wound Location Orientation: Left;Upper  Wound Description (Comments): Incision for PPM atrial lead upgrade & gen change      Assessments 03/17/2020  6:25 PM 03/25/2020 10:00 AM   Site Description Clean;Dry;Intact Clean;Dry   Peri-wound Description  Clean;Dry;Intact       No Linked orders to display   Indication for Foley and estimated target removal date?  Accurate I/O for hemodynamically unstable ICU Patient.      Psycho/Social:  Patient is withdrawn and tearful. Family visiting.    Interpreter Services:  Does the patient require an Interpreter?   No
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
## 2020-03-25 NOTE — Plan of Care (Signed)
Problem: Compromised Tissue integrity  Goal: Damaged tissue is healing and protected  03/25/2020 1717 by Diane Danas, RN  Outcome: Progressing  Flowsheets (Taken 03/24/2020 0034 by Diane Mains, RN)  Damaged tissue is healing and protected:   Monitor/assess Braden scale every shift   Provide wound care per wound care algorithm   Reposition patient every 2 hours and as needed unless able to reposition self   Increase activity as tolerated/progressive mobility   Use bath wipes, not soap and water, for daily bathing   Relieve pressure to bony prominences for patients at moderate and high risk   Keep intact skin clean and dry   Use incontinence wipes for cleaning urine, stool and caustic drainage. Foley care as needed   Monitor external devices/tubes for correct placement to prevent pressure, friction and shearing   Encourage use of lotion/moisturizer on skin  03/25/2020 1717 by Diane Danas, RN  Outcome: Progressing  Goal: Nutritional status is improving  03/25/2020 1717 by Diane Danas, RN  Outcome: Progressing  Flowsheets (Taken 03/24/2020 0034 by Diane Mains, RN)  Nutritional status is improving:   Allow adequate time for meals   Encourage patient to take dietary supplement(s) as ordered  03/25/2020 1717 by Diane Danas, RN  Outcome: Progressing     Problem: Moderate/High Fall Risk Score >5  Goal: Patient will remain free of falls  03/25/2020 1717 by Diane Danas, RN  Outcome: Progressing  Flowsheets  Taken 03/25/2020 0800 by Diane Danas, RN  High (Greater than 13):   HIGH-Utilize chair pad alarm for patient while in the chair   HIGH-Bed alarm on at all times while patient in bed   HIGH-Visual cue at entrance to patient's room   HIGH-Initiate use of floor mats as appropriate   HIGH-Consider use of low bed  Taken 03/24/2020 0800 by Diane June, RN  Moderate Risk (6-13):   MOD-Consider activation of bed alarm if appropriate   MOD-Apply bed exit alarm if patient is confused   MOD-Floor mat at  bedside (where available) if appropriate   MOD-Consider a move closer to Nurses Station   MOD-Remain with patient during toileting   MOD-Place bedside commode and assistive devices out of sight when not in use   MOD-Re-orient confused patients   MOD-Utilize diversion activities   MOD-Perform dangle, stand, walk (DSW) prior to mobilization   MOD-Request PT/OT consult order for patients with gait/mobility impairment   MOD-Use gait belt when appropriate   MOD-include family in multidisciplinary POC discussions   MOD- Consider video monitoring  Taken 03/23/2020 2000 by Diane Kuba, RN  VH High Risk (Greater than 13):   Use of floor mat   Keep door open for better visibility  03/25/2020 1717 by Diane Danas, RN  Outcome: Progressing     Problem: Inadequate Gas Exchange  Goal: Adequate oxygenation and improved ventilation  03/25/2020 1717 by Diane Danas, RN  Outcome: Progressing  Flowsheets (Taken 03/25/2020 0149 by Diane Mains, RN)  Adequate oxygenation and improved ventilation:   Assess lung sounds   Provide mechanical and oxygen support to facilitate gas exchange   Teach/reinforce use of incentive spirometer 10 times per hour while awake, cough and deep breath as needed   Plan activities to conserve energy: plan rest periods  03/25/2020 1717 by Diane Danas, RN  Outcome: Progressing  Goal: Patent Airway maintained  03/25/2020 1717 by Diane Danas, RN  Outcome: Progressing  Flowsheets (Taken 03/23/2020 2000 by Diane Kuba, RN)  Patent airway maintained:   Position patient for maximum  ventilatory efficiency   Reposition patient every 2 hours and as needed unless able to self-reposition  03/25/2020 1717 by Diane Danas, RN  Outcome: Progressing     Problem: Inadequate Airway Clearance  Goal: Normal respiratory rate/effort achieved/maintained  03/25/2020 1717 by Diane Danas, RN  Outcome: Progressing  03/25/2020 1717 by Diane Danas, RN  Outcome: Progressing     Problem: Safety  Goal: Patient will be  free from injury during hospitalization  03/25/2020 1717 by Diane Danas, RN  Outcome: Progressing  Flowsheets (Taken 03/23/2020 2000 by Diane Kuba, RN)  Patient will be free from injury during hospitalization:   Assess patient's risk for falls and implement fall prevention plan of care per policy   Use appropriate transfer methods   Provide and maintain safe environment  03/25/2020 1717 by Diane Danas, RN  Outcome: Progressing  Goal: Patient will be free from infection during hospitalization  03/25/2020 1717 by Diane Danas, RN  Outcome: Progressing  Flowsheets (Taken 03/23/2020 2000 by Diane Kuba, RN)  Free from Infection during hospitalization:   Assess and monitor for signs and symptoms of infection   Monitor lab/diagnostic results  03/25/2020 1717 by Diane Danas, RN  Outcome: Progressing     Problem: Pain  Goal: Pain at adequate level as identified by patient  03/25/2020 1717 by Diane Danas, RN  Outcome: Progressing  03/25/2020 1717 by Diane Danas, RN  Outcome: Progressing     Problem: Side Effects from Pain Analgesia  Goal: Patient will experience minimal side effects of analgesic therapy  03/25/2020 1717 by Diane Danas, RN  Outcome: Progressing  03/25/2020 1717 by Diane Danas, RN  Outcome: Progressing     Problem: Discharge Barriers  Goal: Patient will be discharged home or other facility with appropriate resources  03/25/2020 1717 by Diane Danas, RN  Outcome: Progressing  03/25/2020 1717 by Diane Danas, RN  Outcome: Progressing     Problem: Psychosocial and Spiritual Needs  Goal: Demonstrates ability to cope with hospitalization/illness  03/25/2020 1717 by Diane Danas, RN  Outcome: Progressing  03/25/2020 1717 by Diane Danas, RN  Outcome: Progressing     Problem: Compromised Hemodynamic Status  Goal: Vital signs and fluid balance maintained/improved  03/25/2020 1717 by Diane Danas, RN  Outcome: Progressing  Flowsheets (Taken 03/24/2020 0034 by Diane Mains, RN)  Vital  signs and fluid balance are maintained/improved:   Position patient for maximum circulation/cardiac output   Monitor/assess vitals and hemodynamic parameters with position changes   Monitor and compare daily weight   Monitor intake and output. Notify LIP if urine output is less than 30 mL/hour.  03/25/2020 1717 by Diane Danas, RN  Outcome: Progressing     Problem: Inadequate Tissue Perfusion-Venous  Goal: Tissue perfusion is adequate-venous  03/25/2020 1717 by Diane Danas, RN  Outcome: Progressing  Flowsheets (Taken 03/23/2020 2000 by Diane Kuba, RN)  Tissue perfusion is adequate-venous:   Increase activity as tolerated / progressive mobility   Elevate feet when in chair  03/25/2020 1717 by Diane Danas, RN  Outcome: Progressing     Problem: Impaired Mobility  Goal: Mobility/Activity is maintained at optimal level for patient  03/25/2020 1717 by Diane Danas, RN  Outcome: Progressing  03/25/2020 1717 by Diane Danas, RN  Outcome: Progressing

## 2020-03-25 NOTE — Plan of Care (Addendum)
[facility] Family Meeting Update      Both daughters at bedside. Explained to them along with their mother that she is continuing to fail despite our maximum medical therapies. Her lactic acid continues to elevate and she remains hypoxic with the inability wean BiPAP despite ongoing aggressive diuresis, aggressive empiric antibiotics, transition to moderate intensity heparin for empiric coverage of a PE, and iNO at 20 ppm for afterload reduction in addition to all of her other ongoing medical therapies. 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 We have no other treatment options at our disposal after discussions with heart failure, advanced lung, and our CICU colleagues.     As a result, the patient states she understands and is done. She is already a DNR/DNI. She accepts that this will lead to her death without the BiPAP. While I was in the room, we attempted to put her on HFNC FIO2 100%/60L with 15 L oxymask with resulting O2 sat dropping into the 50's and worsening work of breathing. Her daughters are in agreement. For now, she no longer wants blood draws or oral medications for her comfort. She wants to keep the BiPAP on so she can visit with family and when she is ready, we will transition off of BiPAP and onto comfort care. Increase IV fentanyl 50 mcg IV q2h for now and escalate further when patient is ready.     Leslee Home, PA-C  [facility]

## 2020-03-25 NOTE — Plan of Care (Signed)
Problem: Compromised Tissue integrity  Goal: Damaged tissue is healing and protected  Outcome: Progressing  Flowsheets (Taken 03/24/2020 0034)  Damaged tissue is healing and protected:   Monitor/assess Braden scale every shift   Provide wound care per wound care algorithm   Reposition patient every 2 hours and as needed unless able to reposition self   Increase activity as tolerated/progressive mobility   Use bath wipes, not soap and water, for daily bathing   Relieve pressure to bony prominences for patients at moderate and high risk   Keep intact skin clean and dry   Use incontinence wipes for cleaning urine, stool and caustic drainage. Foley care as needed   Monitor external devices/tubes for correct placement to prevent pressure, friction and shearing   Encourage use of lotion/moisturizer on skin  Goal: Nutritional status is improving  Outcome: Progressing  Flowsheets (Taken 03/24/2020 0034)  Nutritional status is improving:   Allow adequate time for meals   Encourage patient to take dietary supplement(s) as ordered     Problem: Moderate/High Fall Risk Score >5  Goal: Patient will remain free of falls  Outcome: Progressing  Flowsheets (Taken 03/24/2020 2000)  High (Greater than 13):   HIGH-Visual cue at entrance to patient's room   HIGH-Bed alarm on at all times while patient in bed   HIGH-Pharmacy to initiate evaluation and intervention per protocol   HIGH-Initiate use of floor mats as appropriate   HIGH-Consider use of low bed     Problem: Inadequate Gas Exchange  Goal: Adequate oxygenation and improved ventilation  Outcome: Progressing  Flowsheets (Taken 03/25/2020 0149)  Adequate oxygenation and improved ventilation:   Assess lung sounds   Provide mechanical and oxygen support to facilitate gas exchange   Teach/reinforce use of incentive spirometer 10 times per hour while awake, cough and deep breath as needed   Plan activities to conserve energy: plan rest periods     Problem: Inadequate Airway  Clearance  Goal: Normal respiratory rate/effort achieved/maintained  Outcome: Progressing  Flowsheets (Taken 03/25/2020 0149)  Normal respiratory rate/effort achieved/maintained: Plan activities to conserve energy: plan rest periods

## 2020-03-25 NOTE — Progress Notes (Signed)
[facility]- Medical Critical Care Service [facility])      INTERMEDIATE CARE PROGRESS NOTE    Date Time: 03/25/20 2:53 PM  Patient Name: [name]  Attending Physician: Janora Norlander*  Primary Care Physician: Diane Lea, MD  Location/Room: Z610/R604.54     Assessment:    [name] is a 74 y.o. 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 She was transferred to [facility] on 12/16. Her hypoxia worsened requiring BiPAP and a bumex infusion for volume overload.   Problem List:   Problem List:   Patient Active Problem List   Diagnosis   . DOE (dyspnea on exertion)   . OSA (obstructive sleep apnea)   . Apical variant hypertrophic cardiomyopathy   . Hypothyroidism   . Pacemaker   . Pulmonary hypertension, primary   . Patient is Jehovah's Witness   . Pacemaker generator end of life   . Encounter for pacemaker at end of battery life   . Acute respiratory failure with hypoxia   . Right ventricular failure       Last 24 hours:    Started iNO @ 20 ppm to aid in afterload reduction. Attempts to wean BiPAP to HFNC or lower FiO2 unsuccessful. Negative 1.8 liters overnight after diuril and increased bumex gtt. BNP this am down 1145 from 1284.     Hospital Course:    12/9:developedacute on chronichypoxic respiratory failure during extubation after elective upgrade of her pacemaker from single to dual chamber.  12/10:extubated and placed on HFNCwith iNO.  12/11: started on antibiotics for possible pneumonia.   12/14: weaned off iNO   12/16:  transferred to [facility]  12/17: dyspnea unchanged. Pain in L toe and ankle concerning for gout w/ + uric acid. Start steroids, colchicine. Worsening dyspnea req bipap. Start bumex gtt for diuresis.   12/18: -2.7 L last 24 hours but symptoms unchanged. Pan cultures obtained and broadened antibiotics. Echocardiogram worsening RV function with R sided volume overload. Discussed with heart failure and advanced lung. Nothing more can offer outside of ongoing diuresis.   Plan:    NEURO:  - Gout L foot/ankle. On steroids, colchicine with improvement  - pain worsening over last 24 hours. No relief w/ lidocaine patch. All from cough and WOB. Prn fentanyl q2h.     CARDIAC:  - acute RV failure this admission. Known HCM and severe pulmonary hypertension. In setting of worsening hypoxia, repeat echocardiogram yesterday with further decline in RV function with associated volume overload, RVSP increased to 117 from 85 on echo 12/10. LV function remains intact with apical variant hypertrophic cardiomyopathy. heart failure team following and nothing further to offer. After discussion with CICU team, resumed iNO at 20 ppm yesterday pm to help with afterload reduction but has not provided much improvement. continue midodrine to help tolerate uptravi.   - AF. Remains rate controlled. ? Concern for PE given worsened hypoxia but has been on eliquis since admission. start empiric moderate intensity heparin as patient unable to lie flat due to tachypnea for CT as well as worsening renal function.     PULM:  - despite ongoing diuresis and support of afterload w/ iNO, unable to wean FIO2 on bipap without  resulting desaturations and tachypnea. CXR today unchanged bibasilar consolidation/atelectasis, progressive pulmonary edema. Continue to wean BiPAP as tolerated and provide breaks with HFNC if tolerated.   - pulmonary hypertension followed by advanced lung. Continue uptravi at current dose. Appreciate reccs. Any attempt at up-titration could  worsen LV diastolic dysfunction and push into LV failure.    - continue nebs, increase to q4h    GI:  - GI prophylaxis: famotidine daily  - Nutrition: currently NPO while on BiPAP. If able to transition back to HFNC will attempt to feed for patient's comfort but so far not successful  - diarrhea. No pericolace given in several days. Resolved today so longer concern for  c diff. Continue to monitor.     RENAL:  - volume overload secondary to worsening RV failure. Increased bumex infusion to 2mg /hr yesterday with minimal improvement, given 4mg  bumex bolus and 500mg  IV diuril and able to get negative 1.8 liters last 24 hours. Additional diuril given today to further aid diuresis. Cr slowly uptrending to 2.1, hypernatremia worsening to 149. No ability to give free water. Trend BMP q6h, lactic q6h. Continue to push to keep negative on I/O 1-2 liters/day    ID:  - afebrile, but leukocytosis worsening. Concern for aspiration pna of secretions given persistent need for BiPAP. Finished 7 day course of zosyn for presumed pneumonia 12/18 and all cultures remain no growth to date. procalcitonin 12/18 unchanged from previous at 0.9. pan cultured yesterday. Antibiotics broadened to IV vancomycin, meropenem. Diarrhea resolved and thus c diff never sent. PO vancomycin d/c. Repeat cultures w/ negative UA, blood cx NGTD, sputum cx still pending. Repeat COVID and influenza negative.    - lactic acid slowly uptrending to 3.9 despite ongoing efforts. Continue to trend q6h    HEME:  - SCDs  - Pharmacologic prophylaxis: has been on eliquis for hx AF. Given acute RV failure, ? Concern for PE. Unable to lie flat or transport to CT given work of breathing plus worsening renal function. Will transition to moderate intensity heparin empirically.     ENDO/RHEUM:  - Glucose goal: 100-180. Has been in range without need for supplemental insulin. Monitor for hypoglycemia while NPO.  - hypothyroidism. Continue home dose  levothyroxine    LINES/FOLEY:  - peripheral IV x 2  - foley catheter x 8 days.     CODE STATUS: NO CPR - SUPPORT OK  Patient is currently able to make medical decisions although winded in doing so. She makes medical decisions with the assistance of her two daughters. Expressed to patient this am that there was nothing further to offer from a heart failure or pulmonary standpoint aside from what we have escalated in the last 24 hours (ie aggressive diuresis, afterload reduction with iNO, broadened aggressive antibiotic therapy).  We will continue to do what we can all the while keeping her comfortable, but if she isn't improving there is nothing more we can do. She remains a DNR/DNI.  Also discussed this with her daughter Dorene Grebe. Herself and her sister are planning to come in this afternoon so we can discuss next steps (ie continue current management, or if patient and daughters prefer can transition to comfort care). Palliative care is aware of these new events and will provide assistance with ongoing decision making and patient comfort.     CONSULTANTS:   - advanced lung  - heart failure/transplant  - palliative care     DISPO:   - keep in [facility] for now. After discussion  with CICU yesterday nothing that they would provide in addition to what we are doing. No benefit from ICU upgrade.     Review of Systems:   Review of Systems - still very tachypneic despite bipap. Lots of pain with coughing. Difficulty swallowing pills and requesting to take them in something thicker than water     Physical Exam:   Temp:  [97.3 F (36.3 C)-98.4 F (36.9 C)] 97.5 F (36.4 C)  Heart Rate:  [69-70] 69  Resp Rate:  [24-37] 29  BP: (101-137)/(57-81) 126/80  FiO2:  [80 %-100 %] 100 %    BP 126/80   Pulse 69   Temp 97.5 F (36.4 C) (Axillary)   Resp (!) 29   Ht 1.613 m (5' 3.5")   Wt 82.5 kg (181 lb 14.1 oz)   SpO2 91%   BMI 31.71 kg/m      General Appearance:  Awake, alert. Tachypneic.   Mental status: alert and oriented to  person, place, and time  Neuro:  Equal strength in upper and lower extremities bilaterally  HEENT: supple, no lymphadenopathy. 1.5cm JVD  Pulmonary: diffuse crackles bilaterally  Cardiac: irregularly irregular, no murmurs, rubs, or gallops  Abdomen:  Positive bowel sounds x 4, non-tender, non-distended  Extremities: warm, trace non-pitting lower extremity edema bilaterally. 1+ DP/PT bilaterally  Skin:   Warm, dry. No visible lesions or rashes noted    Labs:     Results     Procedure Component Value Units Date/Time    CULTURE BLOOD AEROBIC AND ANAEROBIC [[id number]] Collected: 03/24/20 1005    Specimen: Blood, Venipuncture Updated: 03/25/20 1421    Narrative:      The order will result in two separate 8-22ml bottles  Please do NOT order repeat blood cultures if one has been  drawn within the last 48 hours  UNLESS concerned for  endocarditis  AVOID BLOOD CULTURE DRAWS FROM CENTRAL LINE IF POSSIBLE  Indications:->Pneumonia  ORDER#: [id number]                                    ORDERED BY: Leslee Home  SOURCE: Blood, Venipuncture                          COLLECTED:  03/24/20 10:05  ANTIBIOTICS AT COLL.:                                RECEIVED :  03/24/20 14:03  Culture Blood Aerobic and Anaerobic        PRELIM      03/25/20 14:21  03/25/20   No Growth after 1 day/s of incubation.      Anti-Xa, UFH [[id number]]  (Abnormal) Collected: 03/25/20 1238     Updated: 03/25/20 1401     Anti-Xa, UFH >2.00    Narrative:      Obtain baseline aPTT prior to heparin initiation if not drawn  previously, to evaluate for underlying coagulopathy or  recent non-heparin anticoagulant. exposure  Every 8 hours after initiation and every rate change until  two subsequent values within goal range, then change  frequency to IHS daily at 0400 until heparin is discontinued.    APTT [[id number]]  (Abnormal) Collected: 03/25/20 1238     Updated: 03/25/20 1400     PTT 42 sec     Narrative:  Obtain baseline aPTT prior to heparin initiation if not  drawn  previously, to evaluate for underlying coagulopathy or  recent non-heparin anticoagulant. exposure  Every 8 hours after initiation and every rate change until  two subsequent values within goal range, then change  frequency to IHS daily at 0400 until heparin is discontinued.    Magnesium [[id number]] Collected: 03/25/20 1238    Specimen: Blood Updated: 03/25/20 1327     Magnesium 2.4 mg/dL     Basic Metabolic Panel [[id number]]  (Abnormal) Collected: 03/25/20 1238    Specimen: Blood Updated: 03/25/20 1327     Glucose 165 mg/dL      BUN 78.2 mg/dL      Creatinine 2.1 mg/dL      Calcium 9.2 mg/dL      Sodium 956 mEq/L      Potassium 3.6 mEq/L      Chloride 103 mEq/L      CO2 24 mEq/L      Anion Gap 22.0    GFR [[id number]] Collected: 03/25/20 1238     Updated: 03/25/20 1327     EGFR 27.8    Phosphorus [[id number]]  (Abnormal) Collected: 03/25/20 1238    Specimen: Blood Updated: 03/25/20 1327     Phosphorus 5.8 mg/dL     Lactic Acid [[id number]]  (Abnormal) Collected: 03/25/20 1238    Specimen: Blood Updated: 03/25/20 1258     Lactic Acid 3.9 mmol/L     Magnesium [[id number]] Collected: 03/25/20 0559    Specimen: Blood Updated: 03/25/20 0730     Magnesium 2.3 mg/dL     Basic Metabolic Panel [[id number]]  (Abnormal) Collected: 03/25/20 0559    Specimen: Blood Updated: 03/25/20 0730     Glucose 101 mg/dL      BUN 63.8 mg/dL      Creatinine 1.7 mg/dL      Calcium 9.0 mg/dL      Sodium 756 mEq/L      Potassium 4.2 mEq/L      Chloride 103 mEq/L      CO2 22 mEq/L      Anion Gap 24.0    Phosphorus [[id number]]  (Abnormal) Collected: 03/25/20 0559    Specimen: Blood Updated: 03/25/20 0730     Phosphorus 6.0 mg/dL     GFR [[id number]] Collected: 03/25/20 0559     Updated: 03/25/20 0730     EGFR 35.5    B-type Natriuretic Peptide [[id number]]  (Abnormal) Collected: 03/25/20 0559    Specimen: Blood Updated: 03/25/20 0721     B-Natriuretic Peptide 1,145 pg/mL     Vancomycin, random [[id number]] Collected: 03/25/20 0559    Specimen:  Blood Updated: 03/25/20 0719     Vancomycin Random <3.0 ug/mL      Vancomycin Time of Last Dose unk     Vancomycin Date of Last Dose 03/24/2020    CBC and differential [[id number]]  (Abnormal) Collected: 03/25/20 0559    Specimen: Blood Updated: 03/25/20 0701     WBC 22.31 x10 3/uL      Hgb 11.9 g/dL      Hematocrit 83.1 %      Platelets 224 x10 3/uL      RBC 3.77 x10 6/uL      MCV 99.7 fL      MCH 31.6 pg      MCHC 31.6 g/dL      RDW 20 %      MPV Unmeasured fL      Neutrophils 87.3 %      Lymphocytes Automated  4.4 %      Monocytes 6.6 %      Eosinophils Automated 0.1 %      Basophils Automated 0.3 %      Immature Granulocytes 1.3 %      Nucleated RBC 3.4 /100 WBC      Neutrophils Absolute 19.47 x10 3/uL      Lymphocytes Absolute Automated 0.98 x10 3/uL      Monocytes Absolute Automated 1.48 x10 3/uL      Eosinophils Absolute Automated 0.02 x10 3/uL      Basophils Absolute Automated 0.06 x10 3/uL      Immature Granulocytes Absolute 0.30 x10 3/uL      Absolute NRBC 0.75 x10 3/uL     Lactic Acid [[id number]] Collected: 03/25/20 0559    Specimen: Blood Updated: 03/25/20 0559    Narrative:      No tourniquet;on ice    CULTURE + Dierdre Forth [[id number]] Collected: 03/25/20 0038    Specimen: Sputum, Expectorated Updated: 03/25/20 0539    Narrative:      ORDER#: [id number]                                    ORDERED BY: Leslee Home  SOURCE: Sputum, Expectorated expectorated sputum     COLLECTED:  03/25/20 00:38  ANTIBIOTICS AT COLL.:                                RECEIVED :  03/25/20 04:21  Stain, Gram (Respiratory)                  FINAL       03/25/20 05:39  03/25/20   Few WBC's             Few Squamous epithelial cells             Few Mixed Respiratory Flora  Culture and Gram Stain, Aerobic, RespiratorPENDING      CULTURE BLOOD AEROBIC AND ANAEROBIC [[id number]] Collected: 03/25/20 0021    Specimen: Blood, Venipuncture Updated: 03/25/20 0243    Narrative:      The order will result in two separate  8-17ml bottles  Please do NOT order repeat blood cultures if one has been  drawn within the last 48 hours  UNLESS concerned for  endocarditis  AVOID BLOOD CULTURE DRAWS FROM CENTRAL LINE IF POSSIBLE  Indications:->Pneumonia  1 BLUE+1 PURPLE    GFR [[id number]] Collected: 03/25/20 0021     Updated: 03/25/20 0051     EGFR 33.2    Phosphorus [[id number]]  (Abnormal) Collected: 03/25/20 0021    Specimen: Blood Updated: 03/25/20 0051     Phosphorus 5.6 mg/dL     Magnesium [[id number]] Collected: 03/25/20 0021    Specimen: Blood Updated: 03/25/20 0051     Magnesium 2.2 mg/dL     Basic Metabolic Panel [[id number]]  (Abnormal) Collected: 03/25/20 0021    Specimen: Blood Updated: 03/25/20 0051     Glucose 113 mg/dL      BUN 75.6 mg/dL      Creatinine 1.8 mg/dL      Calcium 9.0 mg/dL      Sodium 433 mEq/L      Potassium 4.8 mEq/L      Chloride 103 mEq/L      CO2 23 mEq/L      Anion Gap 21.0    Lactic  Acid [[id number]]  (Abnormal) Collected: 03/25/20 0021    Specimen: Blood Updated: 03/25/20 0033     Lactic Acid 3.0 mmol/L     Lactic Acid [[id number]]  (Abnormal) Collected: 03/24/20 1803    Specimen: Blood Updated: 03/24/20 1817     Lactic Acid 3.9 mmol/L     GFR [[id number]] Collected: 03/24/20 1725     Updated: 03/24/20 1802     EGFR 31.2    Magnesium [[id number]] Collected: 03/24/20 1725    Specimen: Blood Updated: 03/24/20 1802     Magnesium 2.3 mg/dL     Basic Metabolic Panel [[id number]]  (Abnormal) Collected: 03/24/20 1725    Specimen: Blood Updated: 03/24/20 1802     Glucose 144 mg/dL      BUN 15.1 mg/dL      Creatinine 1.9 mg/dL      Calcium 9.0 mg/dL      Sodium 761 mEq/L      Potassium 4.5 mEq/L      Chloride 103 mEq/L      CO2 21 mEq/L      Anion Gap 23.0    Phosphorus [[id number]]  (Abnormal) Collected: 03/24/20 1725    Specimen: Blood Updated: 03/24/20 1802     Phosphorus 5.4 mg/dL     IRSWN-46 (SARS-CoV-2) and Influenza A/B, NAA ([facility] Rapid) [[id number]] Collected: 03/24/20 1447    Specimen: Culturette from Nasopharyngeal  Updated: 03/24/20 1612     Purpose of COVID testing Diagnostic -PUI     SARS-CoV-2 Specimen Source Nasopharyngeal     SARS CoV 2 Overall Result Not Detected     Influenza A Not Detected     Influenza B Not Detected    Narrative:      o Collect and clearly label specimen type:  o PREFERRED-Upper respiratory specimen: One Nasopharyngeal  Swab in Transport Media.  o Hand deliver to laboratory ASAP  Diagnostic -PUI    ADULT Urinalysis Reflex to Microscopic Exam - Reflex to Culture [[id number]]  (Abnormal) Collected: 03/24/20 1447     Updated: 03/24/20 1553     Urine Type Urine, Catheriz     Color, UA Yellow     Clarity, UA Clear     Specific Gravity UA 1.014     Urine pH 5.0     Leukocyte Esterase, UA Negative     Nitrite, UA Negative     Protein, UR 30     Glucose, UA Negative     Ketones UA Negative     Urobilinogen, UA Normal mg/dL      Bilirubin, UA Negative     Blood, UA Small     RBC, UA 0 - 2 /hpf      WBC, UA 0 - 5 /hpf      Squamous Epithelial Cells, Urine 0 - 5 /hpf      Yeast, UA Rare    Magnesium [[id number]] Collected: 03/24/20 1333    Specimen: Blood Updated: 03/24/20 1517     Magnesium 2.2 mg/dL     Basic Metabolic Panel [[id number]]  (Abnormal) Collected: 03/24/20 1333    Specimen: Blood Updated: 03/24/20 1517     Glucose 110 mg/dL      BUN 52.7 mg/dL      Creatinine 1.8 mg/dL      Calcium 8.9 mg/dL      Sodium 782 mEq/L      Potassium 4.2 mEq/L      Chloride 104 mEq/L      CO2 25 mEq/L      Anion  Gap 19.0    Phosphorus [[id number]]  (Abnormal) Collected: 03/24/20 1333    Specimen: Blood Updated: 03/24/20 1517     Phosphorus 4.8 mg/dL     GFR [[id number]] Collected: 03/24/20 1333     Updated: 03/24/20 1517     EGFR 33.2          Radiology / Imaging:     XR Chest AP Portable   Final Result    Persistent cardiomegaly with bilateral infiltrates versus   edema. Progressive left greater than right basilar   collapse/consolidation.      Prince Solian, MD    03/25/2020 9:41 AM      XR Chest AP Portable   Final Result      Findings consistent with mild CHF and left basilar   atelectasis.      Geanie Cooley, MD    03/24/2020 7:41 PM      Echocardiogram [facility]   Final Result      XR Chest AP Portable   Final Result      1. No significant change diffuse opacities or edema and mild left   basilar atelectasis.      Clide Cliff, MD    03/24/2020 8:37 AM      XR Chest AP Portable   Final Result     CHF      Gerlene Burdock, MD    03/23/2020 6:27 PM      CT Chest WO Contrast   Final Result      1. New smooth interstitial thickening, groundglass and trace effusions   favoring congestion. 4 mm subpleural right lower lobe nodule needs no   additional follow-up in the absence of known malignancy or significant   risk factors.   2. Stable mediastinal adenopathy.      Bosie Helper, MD    03/21/2020 11:42 AM      XR Chest AP Portable   Final Result    Persistent, potentially increasing moderate cardiomegaly   and/or pericardial effusion. Unchanged moderate diffuse bilateral   interstitial lung opacities. Remainder as above.      Wilmon Pali, MD    03/21/2020 7:19 AM      XR Chest AP Portable   Final Result    Slightly decreasing vascular congestion and interstitial   edema. Remainder as above.      Wilmon Pali, MD    03/20/2020 7:47 AM      XR Chest AP Portable   Final Result   Findings of pulmonary edema, bilateral pleural effusions and   multifocal airspace opacity.      Colonel Bald, MD    03/19/2020 8:53 AM      XR Chest AP Portable   Final Result     CHF      Gerlene Burdock, MD    03/18/2020 8:17 AM      XR Chest AP Portable   Final Result   . Improved edema post extubation       Marty Heck, MD    03/17/2020 12:19 PM      Echocardiogram Adult W Contrast Complete W Color Doppler   Final Result      CT Head WO Contrast   Final Result    CT scan of the head shows no hydrocephalus, herniation, or   intracranial hemorrhage. There is no visible acute intracranial   abnormality.      Miguel Dibble, MD    03/16/2020 5:11 AM  XR  Chest AP Portable   Final Result      1. Cardiac enlargement and increased vascular congestion   2. Bibasilar opacities and increased lingular opacities, likely   atelectasis      Genelle Bal    03/23/20 8:17 PM      XR Chest AP Portable    (Results Pending)       I have spent 60 minutes providing critical care of this patient with the following problems: acute hypoxic respiratory failure, worsening acute on chronic right ventricular failure, severe pulmonary hypertension, hypertrophic cardiomyopathy, sepsis, volume overload without improvement from diuresis, ongoing goals of care as reaching end of life. >50% of that time was spent in counseling and coordination of care. Any care time performed today is exclusive of teaching, billable procedures, and not overlapping with any other providers.    Signed by: Tracie Harrier, PA  03/25/2020 2:53 PM  ZO:[id number], Despina Arias, MD

## 2020-03-26 ENCOUNTER — Inpatient Hospital Stay: Payer: Medicare Other

## 2020-03-26 LAB — HEPATIC FUNCTION PANEL
ALT: 26 U/L (ref 0–55)
AST (SGOT): 19 U/L (ref 5–34)
Albumin/Globulin Ratio: 0.6 — ABNORMAL LOW (ref 0.9–2.2)
Albumin: 2.7 g/dL — ABNORMAL LOW (ref 3.5–5.0)
Alkaline Phosphatase: 130 U/L — ABNORMAL HIGH (ref 37–106)
Bilirubin Direct: 0.3 mg/dL (ref 0.0–0.5)
Bilirubin Indirect: 0.2 mg/dL (ref 0.2–1.0)
Bilirubin, Total: 0.5 mg/dL (ref 0.2–1.2)
Globulin: 4.5 g/dL — ABNORMAL HIGH (ref 2.0–3.6)
Protein, Total: 7.2 g/dL (ref 6.0–8.3)

## 2020-03-26 MED ORDER — MORPHINE SULFATE 2 MG/ML IJ/IV SOLN (WRAP)
2.0000 mg | Freq: Once | Status: AC
Start: 2020-03-26 — End: 2020-03-26
  Administered 2020-03-26: 14:00:00 2 mg via INTRAVENOUS
  Filled 2020-03-26: qty 1

## 2020-03-26 MED ORDER — SODIUM CHLORIDE 0.9 % IV MBP
500.0000 mg | Freq: Two times a day (BID) | INTRAVENOUS | Status: DC
Start: 2020-03-26 — End: 2020-03-26

## 2020-03-26 MED ORDER — MORPHINE SULFATE-NACL 100-0.9 MG/100ML-% IV SOLN
0.0000 mg/h | INTRAVENOUS | Status: DC
Start: 2020-03-26 — End: 2020-03-27
  Administered 2020-03-26: 14:00:00 2 mg/h via INTRAVENOUS
  Filled 2020-03-26: qty 100

## 2020-03-26 MED ORDER — METHYLPREDNISOLONE SODIUM SUCC 40 MG IJ SOLR
40.0000 mg | Freq: Two times a day (BID) | INTRAMUSCULAR | Status: DC
Start: 2020-03-26 — End: 2020-03-26
  Administered 2020-03-26: 08:00:00 40 mg via INTRAVENOUS
  Filled 2020-03-26: qty 1

## 2020-03-26 MED ORDER — LORAZEPAM 2 MG/ML IJ SOLN
0.5000 mg | Freq: Once | INTRAMUSCULAR | Status: AC
Start: 2020-03-26 — End: 2020-03-26
  Administered 2020-03-26: 08:00:00 0.5 mg via INTRAVENOUS
  Filled 2020-03-26: qty 1

## 2020-03-26 MED ORDER — LORAZEPAM 2 MG/ML IJ SOLN
1.0000 mg | INTRAMUSCULAR | Status: DC | PRN
Start: 2020-03-26 — End: 2020-03-27
  Administered 2020-03-26: 16:00:00 1 mg via INTRAVENOUS
  Filled 2020-03-26: qty 1

## 2020-03-28 ENCOUNTER — Encounter (INDEPENDENT_AMBULATORY_CARE_PROVIDER_SITE_OTHER): Payer: Medicare Other | Admitting: Physician Assistant

## 2020-04-07 NOTE — Plan of Care (Incomplete)
Events:   Fentanyl x2 PRN  Pt de-sats when coughing to as low as 82% while on 100% BiPap, RT called to give scheduled duo-neb. Patient oxygen saturation slow to improve to goal of 88%    Problem: Compromised Tissue integrity  Goal: Damaged tissue is healing and protected  04/08/20 0454 by Darel Hong, RN  Outcome: Progressing  Flowsheets (Taken 03/24/2020 0034 by [name], RN)  Damaged tissue is healing and protected:   Monitor/assess Braden scale every shift   Provide wound care per wound care algorithm   Reposition patient every 2 hours and as needed unless able to reposition self   Increase activity as tolerated/progressive mobility   Use bath wipes, not soap and water, for daily bathing   Relieve pressure to bony prominences for patients at moderate and high risk   Keep intact skin clean and dry   Use incontinence wipes for cleaning urine, stool and caustic drainage. Foley care as needed   Monitor external devices/tubes for correct placement to prevent pressure, friction and shearing   Encourage use of lotion/moisturizer on skin  03/25/2020 2330 by Darel Hong, RN  Outcome: Progressing  03/25/2020 2330 by Darel Hong, RN  Outcome: Progressing  Flowsheets (Taken 03/24/2020 0034 by [name], RN)  Damaged tissue is healing and protected:   Monitor/assess Braden scale every shift   Provide wound care per wound care algorithm   Reposition patient every 2 hours and as needed unless able to reposition self   Increase activity as tolerated/progressive mobility   Use bath wipes, not soap and water, for daily bathing   Relieve pressure to bony prominences for patients at moderate and high risk   Keep intact skin clean and dry   Use incontinence wipes for cleaning urine, stool and caustic drainage. Foley care as needed   Monitor external devices/tubes for correct placement to prevent pressure, friction and shearing   Encourage use of lotion/moisturizer on skin  Goal: Nutritional status is  improving  2020-04-08 0454 by Darel Hong, RN  Outcome: Progressing  Flowsheets (Taken 03/24/2020 0034 by [name], RN)  Nutritional status is improving:   Allow adequate time for meals   Encourage patient to take dietary supplement(s) as ordered  03/25/2020 2330 by Darel Hong, RN  Outcome: Progressing  03/25/2020 2330 by Darel Hong, RN  Outcome: Progressing  Flowsheets (Taken 03/24/2020 0034 by [name], RN)  Nutritional status is improving:   Allow adequate time for meals   Encourage patient to take dietary supplement(s) as ordered     Problem: Moderate/High Fall Risk Score >5  Goal: Patient will remain free of falls  2020-04-08 0454 by Darel Hong, RN  Outcome: Progressing  Flowsheets (Taken 03/25/2020 2330)  VH High Risk (Greater than 13):   ALL REQUIRED LOW INTERVENTIONS   ALL REQUIRED MODERATE INTERVENTIONS   Keep door open for better visibility   Use of floor mat  03/25/2020 2330 by Darel Hong, RN  Outcome: Progressing  03/25/2020 2330 by Darel Hong, RN  Outcome: Progressing  Flowsheets (Taken 03/25/2020 2330)  VH High Risk (Greater than 13):   ALL REQUIRED LOW INTERVENTIONS   ALL REQUIRED MODERATE INTERVENTIONS   Keep door open for better visibility   Use of floor mat     Problem: Inadequate Gas Exchange  Goal: Adequate oxygenation and improved ventilation  Apr 08, 2020 0454 by Darel Hong, RN  Outcome: Progressing  Flowsheets (Taken 03/25/2020 0149 by [name], RN)  Adequate oxygenation and improved ventilation:   Assess lung sounds   Provide mechanical and oxygen support to facilitate gas exchange   Teach/reinforce use of incentive spirometer 10 times per hour while awake, cough and deep breath as needed   Plan activities to conserve energy: plan rest periods  03/25/2020 2330 by Darel Hong, RN  Outcome: Progressing  03/25/2020 2330 by Darel Hong, RN  Outcome: Progressing  Goal: Patent Airway maintained  04/05/2020 0454 by  Darel Hong, RN  Outcome: Progressing  Flowsheets (Taken 03/23/2020 2000 by [name], RN)  Patent airway maintained:   Position patient for maximum ventilatory efficiency   Reposition patient every 2 hours and as needed unless able to self-reposition  03/25/2020 2330 by Darel Hong, RN  Outcome: Progressing  03/25/2020 2330 by Darel Hong, RN  Outcome: Progressing     Problem: Inadequate Airway Clearance  Goal: Normal respiratory rate/effort achieved/maintained  03/25/2020 0454 by Darel Hong, RN  Outcome: Progressing  Flowsheets (Taken 03/25/2020 0149 by [name], RN)  Normal respiratory rate/effort achieved/maintained: Plan activities to conserve energy: plan rest periods  03/25/2020 2330 by Darel Hong, RN  Outcome: Progressing  03/25/2020 2330 by Darel Hong, RN  Outcome: Progressing     Problem: Safety  Goal: Patient will be free from injury during hospitalization  03/07/2020 0454 by Darel Hong, RN  Outcome: Progressing  Flowsheets (Taken 03/23/2020 2000 by [name], RN)  Patient will be free from injury during hospitalization:   Assess patient's risk for falls and implement fall prevention plan of care per policy   Use appropriate transfer methods   Provide and maintain safe environment  03/25/2020 2330 by Darel Hong, RN  Outcome: Progressing  03/25/2020 2330 by Darel Hong, RN  Outcome: Progressing  Goal: Patient will be free from infection during hospitalization  04/03/2020 0454 by Darel Hong, RN  Outcome: Progressing  Flowsheets (Taken 03/23/2020 2000 by [name], RN)  Free from Infection during hospitalization:   Assess and monitor for signs and symptoms of infection   Monitor lab/diagnostic results  03/25/2020 2330 by Darel Hong, RN  Outcome: Progressing  03/25/2020 2330 by Darel Hong, RN  Outcome: Progressing     Problem: Pain  Goal: Pain at adequate level as identified by patient  04/03/2020 0454 by  Darel Hong, RN  Outcome: Progressing  Flowsheets (Taken 04/02/2020 0454)  Pain at adequate level as identified by patient:   Identify patient comfort function goal   Assess for risk of opioid induced respiratory depression, including snoring/sleep apnea. Alert healthcare team of risk factors identified.   Assess pain on admission, during daily assessment and/or before any "as needed" intervention(s)   Reassess pain within 30-60 minutes of any procedure/intervention, per Pain Assessment, Intervention, Reassessment (AIR) Cycle   Evaluate if patient comfort function goal is met   Evaluate patient's satisfaction with pain management progress   Consult/collaborate with Pain Service   Offer non-pharmacological pain management interventions   Consult/collaborate with Physical Therapy, Occupational Therapy, and/or Speech Therapy   Include patient/patient care companion in decisions related to pain management as needed  03/25/2020 2330 by Darel Hong, RN  Outcome: Progressing  03/25/2020 2330 by Darel Hong, RN  Outcome: Progressing     Problem: Side Effects from Pain Analgesia  Goal: Patient will experience minimal side effects of analgesic therapy  03/29/2020 0454 by Darel Hong, RN  Outcome: Progressing  Flowsheets (Taken  03/23/2020 2000 by [name], RN)  Patient will experience minimal side effects of analgesic therapy:   Monitor/assess patient's respiratory status (RR depth, effort, breath sounds)   Assess for changes in cognitive function  03/25/2020 2330 by Darel Hong, RN  Outcome: Progressing  03/25/2020 2330 by Darel Hong, RN  Outcome: Progressing     Problem: Discharge Barriers  Goal: Patient will be discharged home or other facility with appropriate resources  2020/04/24 0454 by Darel Hong, RN  Outcome: Progressing  Flowsheets (Taken 03/23/2020 2000 by [name], RN)  Discharge to home or other facility with appropriate resources: Provide appropriate  patient education  03/25/2020 2330 by Darel Hong, RN  Outcome: Progressing  03/25/2020 2330 by Darel Hong, RN  Outcome: Progressing     Problem: Psychosocial and Spiritual Needs  Goal: Demonstrates ability to cope with hospitalization/illness  April 24, 2020 0454 by Darel Hong, RN  Outcome: Progressing  Flowsheets (Taken 03/23/2020 2000 by [name], RN)  Demonstrates ability to cope with hospitalizations/illness:   Encourage verbalization of feelings/concerns/expectations   Provide quiet environment  03/25/2020 2330 by Darel Hong, RN  Outcome: Progressing  03/25/2020 2330 by Darel Hong, RN  Outcome: Progressing     Problem: Compromised Hemodynamic Status  Goal: Vital signs and fluid balance maintained/improved  04-24-20 0454 by Darel Hong, RN  Outcome: Progressing  Flowsheets (Taken 03/24/2020 0034 by [name], RN)  Vital signs and fluid balance are maintained/improved:   Position patient for maximum circulation/cardiac output   Monitor/assess vitals and hemodynamic parameters with position changes   Monitor and compare daily weight   Monitor intake and output. Notify LIP if urine output is less than 30 mL/hour.  03/25/2020 2330 by Darel Hong, RN  Outcome: Progressing  03/25/2020 2330 by Darel Hong, RN  Outcome: Progressing     Problem: Inadequate Tissue Perfusion-Venous  Goal: Tissue perfusion is adequate-venous  04-24-20 0454 by Darel Hong, RN  Outcome: Progressing  Flowsheets (Taken 03/23/2020 2000 by [name], RN)  Tissue perfusion is adequate-venous:   Increase activity as tolerated / progressive mobility   Elevate feet when in chair  03/25/2020 2330 by Darel Hong, RN  Outcome: Progressing  03/25/2020 2330 by Darel Hong, RN  Outcome: Progressing     Problem: Impaired Mobility  Goal: Mobility/Activity is maintained at optimal level for patient  April 24, 2020 0454 by Darel Hong, RN  Outcome:  Progressing  Flowsheets (Taken 03/23/2020 2000 by [name], RN)  Mobility/activity is maintained at optimal level for patient:   Increase mobility as tolerated/progressive mobility   Encourage independent activity per ability   Assess for changes in respiratory status, level of consciousness and/or development of fatigue  03/25/2020 2330 by Darel Hong, RN  Outcome: Progressing  03/25/2020 2330 by Darel Hong, RN  Outcome: Progressing

## 2020-04-07 NOTE — Progress Notes (Signed)
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 Her CICU course has been complicated by cardiogenic shock secondary RV failure and worsening hypoxic respiratory failure. She was transferred to [facility] on 12/16. She has had worsening oxygenation throughout the course of her [facility] stay.       Problem List:   Problem List:   Patient Active Problem List   Diagnosis    DOE (dyspnea on exertion)    OSA (obstructive sleep apnea)    Apical variant hypertrophic cardiomyopathy    Hypothyroidism    Pacemaker    Pulmonary hypertension, primary    Patient is Jehovah's Witness    Pacemaker generator end of life    Encounter for pacemaker at end of battery life    Acute respiratory failure with hypoxia    Right ventricular failure       Last 24 hours:      Patient seen and examined at bedside.   Multiple episodes of desaturation while coughing.  Unable to transitioned to HFNC due to immediate desaturation.  Saturations drop to the 70s when iNO weaned down.     Hospital Course:      12/9: developed acute on chronic hypoxic respiratory failure during extubation after elective upgrade of her pacemaker from single to dual chamber.  12/10: extubated and placed on HFNC with iNO.  12/11: started on antibiotics for possible pneumonia.   12/14: weaned off iNO   12/16: transferred to [facility]  12/17-12/19: worsening oxygenation,  BiPAP/iNO dependent    Plan:      NEURO:   Significant distress on exam, will continue current PRN fentanyl dose for pain/work of breathing, will administer Ativan this morning, patient agrees to this.   Daughters will come in today and we will have to discuss pure comfort directed measures.    Prednisone and colchicine for gout flare.     CARDIAC:   Clinical picture more consistent with RV failure than LV failure.    2D echo with signs of RV overload as opposed to LV disease.    At this point would continue iNO at current dose, she does not tolerate reduction in the dose.    Continue Uptravi 1200 mcg Q12, midodrine to offset hypotension.    Rate controlled atrial fibrillation, Heparin for AC.    PULM:   Acute on chronic hypoxic respiratory failure, likely secondary to RV failure.   CXR with persistent bilateral infiltrates.   Currently on BiPAP at 100%, at this point BiPAP dependent.   Albuterol TID for bronchodilatory effect.    Continue to maintain saturations > 90%.     GI:   GI prophylaxis: Pepcid daily   Nutrition: NPO while on BiPAP    RENAL:   Last creatinine level 2.1.   Continue Bumex drip at this time, 650 ml output overnight.   Urine appears very dilute still, would continue Bumex drip.   Continue to monitor  lytes.      ID:   Antibiotics broadened to Vancomycin and Meropenam on 12/18.   Cultures negative til day.   Will follow up cultures and deescalate as able.       HEME:   Pharmacologic prophylaxis: currently on therapeutic Heparin for atrial fibrillation.   No other acute issues.    ENDO/RHEUM:    Glucose goal: 100-180    LINES/FOLEY: Foley for I's and O's    CODE STATUS: NO CPR - SUPPORT OK     Patient is currently able to make medical decisions.      Discussed current diagnoses, prognosis and goals of care.     Her daughters will be coming in today and we will be having a conversation regarding transitioning to comfort directed care.     CONSULTANTS: ALD    DISPO:   -  Anticipated discharge needs: Stays in [facility] due to BiPAP needs.     Review of Systems:   Review of Systems - as above    Physical Exam:   Temp:  [97.5 F (36.4 C)-98 F (36.7 C)] 97.8 F (36.6 C)  Heart Rate:  [69-73] 73  Resp Rate:  [19-37] 25  BP: (98-126)/(53-81) 104/61  FiO2:  [89 %-100 %] 100 %    BP 104/61    Pulse 73    Temp 97.8 F (36.6 C) (Axillary)    Resp (!) 25    Ht 1.613 m (5' 3.5")    Wt 82.5 kg (181 lb 14.1 oz)    SpO2 90%    BMI 31.71 kg/m      General: awake, alert, oriented x 3, respiratory distress   HEENT: pupils equal with EOMI  Cardiovascular: irregular rhythm, no murmurs  Lungs: bilateral crackles  Abdomen: soft, non-tender, non-distended  Extremities: no clubbing, cyanosis, lower extremity edema noted  Neuro: no focal abnormalities   Musculoskeletal: nl ROM, no muscle weakness    Labs:     Results     Procedure Component Value Units Date/Time    CULTURE BLOOD AEROBIC AND ANAEROBIC [[id number]] Collected: 03/25/20 0021    Specimen: Blood, Venipuncture Updated: 03/11/2020 0321    Narrative:      The order will result in two separate 8-75ml bottles  Please do NOT order repeat blood cultures if one has been  drawn within the last 48 hours  UNLESS concerned for  endocarditis  AVOID BLOOD CULTURE DRAWS FROM CENTRAL LINE IF POSSIBLE  Indications:->Pneumonia  ORDER#: [id number]                                    ORDERED BY: Leslee Home  SOURCE: Blood, Venipuncture                          COLLECTED:  03/25/20 00:21  ANTIBIOTICS AT COLL.:                                RECEIVED :  03/25/20 02:43  Culture Blood Aerobic and Anaerobic        PRELIM      03/17/2020 03:21  04/03/2020   No Growth after 1 day/s of incubation.      Basic Metabolic Panel [[id number]]  (Abnormal) Collected: 03/25/20 1820    Specimen: Blood Updated: 03/25/20 1907     Glucose 155 mg/dL  BUN 75.0 mg/dL      Creatinine 2.1 mg/dL      Calcium 9.2 mg/dL      Sodium 098 mEq/L      Potassium 3.9 mEq/L      Chloride 100 mEq/L      CO2  28 mEq/L      Anion Gap 22.0    Phosphorus [[id number]]  (Abnormal) Collected: 03/25/20 1820    Specimen: Blood Updated: 03/25/20 1907     Phosphorus 5.6 mg/dL     GFR [[id number]] Collected: 03/25/20 1820     Updated: 03/25/20 1907     EGFR 27.8    Magnesium [[id number]] Collected: 03/25/20 1820    Specimen: Blood Updated: 03/25/20 1907     Magnesium 2.6 mg/dL     Lactic Acid [[id number]]  (Abnormal) Collected: 03/25/20 1820    Specimen: Blood Updated: 03/25/20 1844     Lactic Acid 4.5 mmol/L     CULTURE BLOOD AEROBIC AND ANAEROBIC [[id number]] Collected: 03/24/20 1005    Specimen: Blood, Venipuncture Updated: 03/25/20 1421    Narrative:      The order will result in two separate 8-78ml bottles  Please do NOT order repeat blood cultures if one has been  drawn within the last 48 hours  UNLESS concerned for  endocarditis  AVOID BLOOD CULTURE DRAWS FROM CENTRAL LINE IF POSSIBLE  Indications:->Pneumonia  ORDER#: [id number]                                    ORDERED BY: Leslee Home  SOURCE: Blood, Venipuncture                          COLLECTED:  03/24/20 10:05  ANTIBIOTICS AT COLL.:                                RECEIVED :  03/24/20 14:03  Culture Blood Aerobic and Anaerobic        PRELIM      03/25/20 14:21  03/25/20   No Growth after 1 day/s of incubation.      Anti-Xa, UFH [[id number]]  (Abnormal) Collected: 03/25/20 1238     Updated: 03/25/20 1401     Anti-Xa, UFH >2.00    Narrative:      Obtain baseline aPTT prior to heparin initiation if not drawn  previously, to evaluate for underlying coagulopathy or  recent non-heparin anticoagulant. exposure  Every 8 hours after initiation and every rate change until  two subsequent values within goal range, then change  frequency to IHS daily at 0400 until heparin is discontinued.    APTT [[id number]]  (Abnormal) Collected: 03/25/20 1238     Updated: 03/25/20 1400     PTT 42 sec     Narrative:      Obtain baseline aPTT prior to heparin initiation if not drawn  previously, to  evaluate for underlying coagulopathy or  recent non-heparin anticoagulant. exposure  Every 8 hours after initiation and every rate change until  two subsequent values within goal range, then change  frequency to IHS daily at 0400 until heparin is discontinued.    Magnesium [[id number]] Collected: 03/25/20 1238    Specimen: Blood Updated: 03/25/20 1327     Magnesium 2.4 mg/dL     Basic Metabolic Panel [[id number]]  (Abnormal) Collected: 03/25/20 1238  Specimen: Blood Updated: 03/25/20 1327     Glucose 165 mg/dL      BUN 14.7 mg/dL      Creatinine 2.1 mg/dL      Calcium 9.2 mg/dL      Sodium 829 mEq/L      Potassium 3.6 mEq/L      Chloride 103 mEq/L      CO2 24 mEq/L      Anion Gap 22.0    GFR [[id number]] Collected: 03/25/20 1238     Updated: 03/25/20 1327     EGFR 27.8    Phosphorus [[id number]]  (Abnormal) Collected: 03/25/20 1238    Specimen: Blood Updated: 03/25/20 1327     Phosphorus 5.8 mg/dL     Lactic Acid [[id number]]  (Abnormal) Collected: 03/25/20 1238    Specimen: Blood Updated: 03/25/20 1258     Lactic Acid 3.9 mmol/L           Radiology / Imaging:     XR Chest AP Portable   Final Result    Persistent cardiomegaly with bilateral infiltrates versus   edema. Progressive left greater than right basilar   collapse/consolidation.      Prince Solian, MD    03/25/2020 9:41 AM      XR Chest AP Portable   Final Result     Findings consistent with mild CHF and left basilar   atelectasis.      Geanie Cooley, MD    03/24/2020 7:41 PM      Echocardiogram [facility]   Final Result      XR Chest AP Portable   Final Result      1. No significant change diffuse opacities or edema and mild left   basilar atelectasis.      Clide Cliff, MD    03/24/2020 8:37 AM      XR Chest AP Portable   Final Result     CHF      Gerlene Burdock, MD    03/23/2020 6:27 PM      CT Chest WO Contrast   Final Result      1. New smooth interstitial thickening, groundglass and trace effusions   favoring congestion. 4 mm subpleural  right lower lobe nodule needs no   additional follow-up in the absence of known malignancy or significant   risk factors.   2. Stable mediastinal adenopathy.      Bosie Helper, MD    03/21/2020 11:42 AM      XR Chest AP Portable   Final Result    Persistent, potentially increasing moderate cardiomegaly   and/or pericardial effusion. Unchanged moderate diffuse bilateral   interstitial lung opacities. Remainder as above.      Wilmon Pali, MD    03/21/2020 7:19 AM      XR Chest AP Portable   Final Result    Slightly decreasing vascular congestion and interstitial   edema. Remainder as above.      Wilmon Pali, MD    03/20/2020 7:47 AM      XR Chest AP Portable   Final Result   Findings of pulmonary edema, bilateral pleural effusions and   multifocal airspace opacity.      Colonel Bald, MD    03/19/2020 8:53 AM      XR Chest AP Portable   Final Result     CHF      Gerlene Burdock, MD    03/18/2020 8:17 AM      XR Chest  AP Portable   Final Result   . Improved edema post extubation       Marty Heck, MD    03/17/2020 12:19 PM      Echocardiogram Adult W Contrast Complete W Color Doppler   Final Result      CT Head WO Contrast   Final Result    CT scan of the head shows no hydrocephalus, herniation, or   intracranial hemorrhage. There is no visible acute intracranial   abnormality.      Miguel Dibble, MD    03/16/2020 5:11 AM      XR Chest AP Portable   Final Result      1. Cardiac enlargement and increased vascular congestion   2. Bibasilar opacities and increased lingular opacities, likely   atelectasis      Genelle Bal    03/17/2020 8:17 PM      XR Chest AP Portable    (Results Pending)       I have spent 41 minutes providing critical care of this patient with the following problems: acute hypoxic respiratory failure. >50% of that time was spent in counseling and coordination of care. Any care time performed today is exclusive of teaching, billable procedures, and not overlapping with any other providers.    Signed by: Loistine Simas, MD  04-18-2020 7:30 AM  ZO:[id number], Despina Arias, MD

## 2020-04-07 NOTE — Death Pronouncement Note (Signed)
Patient placed on comfort measures earlier this afternoon.    Called by RN that the patient was in asystole.    Immediately went to evaluate the patient.    No response to stimuli.  Blown, non-reactive pupils.  Absent heart sounds  Absent breath sounds.    Patient was pronounced dead at 4:29 PM. Family was at bedside. They declined autopsy.    [name]. Williams Che, M.D.  [facility] Attending

## 2020-04-07 NOTE — Plan of Care (Signed)
Events:    Fentanyl x3 PRN for Chest Pain   Pt de-sats when coughing to as low as 82% while on 100% BiPap, RT called to give scheduled duo-neb. Patient oxygen saturation slow to improve to goal of 88%    Oriented x4, MAE, FC. C/o of Chest pain, managed with Fentanyl IV with good effect. V-paced HR  60s-70s, SBP 90s-120s, afebrile. Bumex gtt running as ordered. Npo while on BiPap 100%, Nitric Oxide running at 7 ppm. AUOP through foley catheter. Call bell within reach, pt able to call for needs.     Problem: Compromised Tissue integrity  Goal: Damaged tissue is healing and protected  Apr 11, 2020 0454 by Darel Hong, RN  Outcome: Progressing  Flowsheets (Taken 03/24/2020 0034 by [name], RN)  Damaged tissue is healing and protected:   Monitor/assess Braden scale every shift   Provide wound care per wound care algorithm   Reposition patient every 2 hours and as needed unless able to reposition self   Increase activity as tolerated/progressive mobility   Use bath wipes, not soap and water, for daily bathing   Relieve pressure to bony prominences for patients at moderate and high risk   Keep intact skin clean and dry   Use incontinence wipes for cleaning urine, stool and caustic drainage. Foley care as needed   Monitor external devices/tubes for correct placement to prevent pressure, friction and shearing   Encourage use of lotion/moisturizer on skin  03/25/2020 2330 by Darel Hong, RN  Outcome: Progressing  03/25/2020 2330 by Darel Hong, RN  Outcome: Progressing  Flowsheets (Taken 03/24/2020 0034 by [name], RN)  Damaged tissue is healing and protected:   Monitor/assess Braden scale every shift   Provide wound care per wound care algorithm   Reposition patient every 2 hours and as needed unless able to reposition self   Increase activity as tolerated/progressive mobility   Use bath wipes, not soap and water, for daily bathing   Relieve pressure to bony prominences for  patients at moderate and high risk   Keep intact skin clean and dry   Use incontinence wipes for cleaning urine, stool and caustic drainage. Foley care as needed   Monitor external devices/tubes for correct placement to prevent pressure, friction and shearing   Encourage use of lotion/moisturizer on skin  Goal: Nutritional status is improving  2020-04-11 0454 by Darel Hong, RN  Outcome: Progressing  Flowsheets (Taken 03/24/2020 0034 by [name], RN)  Nutritional status is improving:   Allow adequate time for meals   Encourage patient to take dietary supplement(s) as ordered  03/25/2020 2330 by Darel Hong, RN  Outcome: Progressing  03/25/2020 2330 by Darel Hong, RN  Outcome: Progressing  Flowsheets (Taken 03/24/2020 0034 by [name], RN)  Nutritional status is improving:   Allow adequate time for meals   Encourage patient to take dietary supplement(s) as ordered     Problem: Moderate/High Fall Risk Score >5  Goal: Patient will remain free of falls  04-11-20 0454 by Darel Hong, RN  Outcome: Progressing  Flowsheets (Taken 03/25/2020 2330)  VH High Risk (Greater than 13):   ALL REQUIRED LOW INTERVENTIONS   ALL REQUIRED MODERATE INTERVENTIONS   Keep door open for better visibility   Use of floor mat  03/25/2020 2330 by Darel Hong, RN  Outcome: Progressing  03/25/2020 2330 by Darel Hong, RN  Outcome: Progressing  Flowsheets (Taken 03/25/2020 2330)  VH High Risk (Greater than 13):  ALL REQUIRED LOW INTERVENTIONS   ALL REQUIRED MODERATE INTERVENTIONS   Keep door open for better visibility   Use of floor mat     Problem: Inadequate Gas Exchange  Goal: Adequate oxygenation and improved ventilation  03/14/2020 0454 by Darel Hong, RN  Outcome: Progressing  Flowsheets (Taken 03/25/2020 0149 by [name], RN)  Adequate oxygenation and improved ventilation:   Assess lung sounds   Provide mechanical and oxygen support to facilitate gas  exchange   Teach/reinforce use of incentive spirometer 10 times per hour while awake, cough and deep breath as needed   Plan activities to conserve energy: plan rest periods  03/25/2020 2330 by Darel Hong, RN  Outcome: Progressing  03/25/2020 2330 by Darel Hong, RN  Outcome: Progressing  Goal: Patent Airway maintained  03/25/2020 0454 by Darel Hong, RN  Outcome: Progressing  Flowsheets (Taken 03/23/2020 2000 by [name], RN)  Patent airway maintained:   Position patient for maximum ventilatory efficiency   Reposition patient every 2 hours and as needed unless able to self-reposition  03/25/2020 2330 by Darel Hong, RN  Outcome: Progressing  03/25/2020 2330 by Darel Hong, RN  Outcome: Progressing     Problem: Inadequate Airway Clearance  Goal: Normal respiratory rate/effort achieved/maintained  03/24/2020 0454 by Darel Hong, RN  Outcome: Progressing  Flowsheets (Taken 03/25/2020 0149 by [name], RN)  Normal respiratory rate/effort achieved/maintained: Plan activities to conserve energy: plan rest periods  03/25/2020 2330 by Darel Hong, RN  Outcome: Progressing  03/25/2020 2330 by Darel Hong, RN  Outcome: Progressing     Problem: Safety  Goal: Patient will be free from injury during hospitalization  04/05/2020 0454 by Darel Hong, RN  Outcome: Progressing  Flowsheets (Taken 03/23/2020 2000 by [name], RN)  Patient will be free from injury during hospitalization:   Assess patient's risk for falls and implement fall prevention plan of care per policy   Use appropriate transfer methods   Provide and maintain safe environment  03/25/2020 2330 by Darel Hong, RN  Outcome: Progressing  03/25/2020 2330 by Darel Hong, RN  Outcome: Progressing  Goal: Patient will be free from infection during hospitalization  03/27/2020 0454 by Darel Hong, RN  Outcome: Progressing  Flowsheets (Taken 03/23/2020 2000 by [name],  RN)  Free from Infection during hospitalization:   Assess and monitor for signs and symptoms of infection   Monitor lab/diagnostic results  03/25/2020 2330 by Darel Hong, RN  Outcome: Progressing  03/25/2020 2330 by Darel Hong, RN  Outcome: Progressing     Problem: Pain  Goal: Pain at adequate level as identified by patient  03/09/2020 0454 by Darel Hong, RN  Outcome: Progressing  Flowsheets (Taken 03/16/2020 0454)  Pain at adequate level as identified by patient:   Identify patient comfort function goal   Assess for risk of opioid induced respiratory depression, including snoring/sleep apnea. Alert healthcare team of risk factors identified.   Assess pain on admission, during daily assessment and/or before any "as needed" intervention(s)   Reassess pain within 30-60 minutes of any procedure/intervention, per Pain Assessment, Intervention, Reassessment (AIR) Cycle   Evaluate if patient comfort function goal is met   Evaluate patient's satisfaction with pain management progress   Consult/collaborate with Pain Service   Offer non-pharmacological pain management interventions   Consult/collaborate with Physical Therapy, Occupational Therapy, and/or Speech Therapy   Include patient/patient care companion in decisions related to pain  management as needed  03/25/2020 2330 by Darel Hong, RN  Outcome: Progressing  03/25/2020 2330 by Darel Hong, RN  Outcome: Progressing     Problem: Side Effects from Pain Analgesia  Goal: Patient will experience minimal side effects of analgesic therapy  15-Apr-2020 0454 by Darel Hong, RN  Outcome: Progressing  Flowsheets (Taken 03/23/2020 2000 by [name], RN)  Patient will experience minimal side effects of analgesic therapy:   Monitor/assess patient's respiratory status (RR depth, effort, breath sounds)   Assess for changes in cognitive function  03/25/2020 2330 by Darel Hong, RN  Outcome: Progressing  03/25/2020 2330 by  Darel Hong, RN  Outcome: Progressing     Problem: Discharge Barriers  Goal: Patient will be discharged home or other facility with appropriate resources  15-Apr-2020 0454 by Darel Hong, RN  Outcome: Progressing  Flowsheets (Taken 03/23/2020 2000 by [name], RN)  Discharge to home or other facility with appropriate resources: Provide appropriate patient education  03/25/2020 2330 by Darel Hong, RN  Outcome: Progressing  03/25/2020 2330 by Darel Hong, RN  Outcome: Progressing     Problem: Psychosocial and Spiritual Needs  Goal: Demonstrates ability to cope with hospitalization/illness  15-Apr-2020 0454 by Darel Hong, RN  Outcome: Progressing  Flowsheets (Taken 03/23/2020 2000 by [name], RN)  Demonstrates ability to cope with hospitalizations/illness:   Encourage verbalization of feelings/concerns/expectations   Provide quiet environment  03/25/2020 2330 by Darel Hong, RN  Outcome: Progressing  03/25/2020 2330 by Darel Hong, RN  Outcome: Progressing     Problem: Compromised Hemodynamic Status  Goal: Vital signs and fluid balance maintained/improved  Apr 15, 2020 0454 by Darel Hong, RN  Outcome: Progressing  Flowsheets (Taken 03/24/2020 0034 by [name], RN)  Vital signs and fluid balance are maintained/improved:   Position patient for maximum circulation/cardiac output   Monitor/assess vitals and hemodynamic parameters with position changes   Monitor and compare daily weight   Monitor intake and output. Notify LIP if urine output is less than 30 mL/hour.  03/25/2020 2330 by Darel Hong, RN  Outcome: Progressing  03/25/2020 2330 by Darel Hong, RN  Outcome: Progressing     Problem: Inadequate Tissue Perfusion-Venous  Goal: Tissue perfusion is adequate-venous  15-Apr-2020 0454 by Darel Hong, RN  Outcome: Progressing  Flowsheets (Taken 03/23/2020 2000 by [name], RN)  Tissue perfusion is adequate-venous:   Increase  activity as tolerated / progressive mobility   Elevate feet when in chair  03/25/2020 2330 by Darel Hong, RN  Outcome: Progressing  03/25/2020 2330 by Darel Hong, RN  Outcome: Progressing     Problem: Impaired Mobility  Goal: Mobility/Activity is maintained at optimal level for patient  04/15/2020 0454 by Darel Hong, RN  Outcome: Progressing  Flowsheets (Taken 03/23/2020 2000 by [name], RN)  Mobility/activity is maintained at optimal level for patient:   Increase mobility as tolerated/progressive mobility   Encourage independent activity per ability   Assess for changes in respiratory status, level of consciousness and/or development of fatigue  03/25/2020 2330 by Darel Hong, RN  Outcome: Progressing  03/25/2020 2330 by Darel Hong, RN  Outcome: Progressing

## 2020-04-07 NOTE — Plan of Care (Signed)
Problem: Compromised Tissue integrity  Goal: Damaged tissue is healing and protected  Outcome: Progressing  Flowsheets (Taken 03/24/2020 0034 by [name], RN)  Damaged tissue is healing and protected:   Monitor/assess Braden scale every shift   Provide wound care per wound care algorithm   Reposition patient every 2 hours and as needed unless able to reposition self   Increase activity as tolerated/progressive mobility   Use bath wipes, not soap and water, for daily bathing   Relieve pressure to bony prominences for patients at moderate and high risk   Keep intact skin clean and dry   Use incontinence wipes for cleaning urine, stool and caustic drainage. Foley care as needed   Monitor external devices/tubes for correct placement to prevent pressure, friction and shearing   Encourage use of lotion/moisturizer on skin  Goal: Nutritional status is improving  Outcome: Progressing     Problem: Moderate/High Fall Risk Score >5  Goal: Patient will remain free of falls  Outcome: Progressing     Problem: Inadequate Gas Exchange  Goal: Adequate oxygenation and improved ventilation  Outcome: Progressing  Goal: Patent Airway maintained  Outcome: Progressing     Problem: Inadequate Airway Clearance  Goal: Normal respiratory rate/effort achieved/maintained  Outcome: Progressing     Problem: Safety  Goal: Patient will be free from injury during hospitalization  Outcome: Progressing  Goal: Patient will be free from infection during hospitalization  Outcome: Progressing     Problem: Pain  Goal: Pain at adequate level as identified by patient  Outcome: Progressing  Flowsheets (Taken Apr 14, 2020 0454 by Darel Hong, RN)  Pain at adequate level as identified by patient:   Identify patient comfort function goal   Assess for risk of opioid induced respiratory depression, including snoring/sleep apnea. Alert healthcare team of risk factors identified.   Assess pain on admission, during daily assessment and/or before any "as  needed" intervention(s)   Reassess pain within 30-60 minutes of any procedure/intervention, per Pain Assessment, Intervention, Reassessment (AIR) Cycle   Evaluate if patient comfort function goal is met   Evaluate patient's satisfaction with pain management progress   Consult/collaborate with Pain Service   Offer non-pharmacological pain management interventions   Consult/collaborate with Physical Therapy, Occupational Therapy, and/or Speech Therapy   Include patient/patient care companion in decisions related to pain management as needed     Problem: Side Effects from Pain Analgesia  Goal: Patient will experience minimal side effects of analgesic therapy  Outcome: Progressing  Flowsheets (Taken 03/23/2020 2000 by [name], RN)  Patient will experience minimal side effects of analgesic therapy:   Monitor/assess patient's respiratory status (RR depth, effort, breath sounds)   Assess for changes in cognitive function     Problem: Discharge Barriers  Goal: Patient will be discharged home or other facility with appropriate resources  Outcome: Progressing     Problem: Psychosocial and Spiritual Needs  Goal: Demonstrates ability to cope with hospitalization/illness  Outcome: Progressing     Problem: Compromised Hemodynamic Status  Goal: Vital signs and fluid balance maintained/improved  Outcome: Progressing     Problem: Inadequate Tissue Perfusion-Venous  Goal: Tissue perfusion is adequate-venous  Outcome: Progressing     Problem: Impaired Mobility  Goal: Mobility/Activity is maintained at optimal level for patient  Outcome: Progressing

## 2020-04-07 NOTE — Death Summary (Signed)
DEATH SUMMARY    Date Time: 03/18/2020  4:36 PM  Patient Name: [facility]  Attending Physician: Loistine Simas, MD  Primary Care Physician: Georgeanna Lea, MD    Date of Admission:                                                                         03-29-2020   LOS: 10 days      Reason for Admission:     Encounter for pacemaker at end of battery life.    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 She was transferred to [facility] on 12/16. She has had worsening oxygenation throughout the course of her [facility] stay.     12/9: developed acute on chronic hypoxic respiratory failure during extubation after elective upgrade of her pacemaker from single to dual chamber.  12/10: extubated and placed on HFNC with iNO.  12/11: started on antibiotics for possible pneumonia.   12/14: weaned off iNO   12/16: transferred to [facility]  12/17-12/19: worsening oxygenation, BiPAP/iNO dependent  12/20: patient and family elected for comfort measures      Procedures Performed:                                                                                Procedure(s) with comments:  PM GENERATOR CHANGE SINGLE - SAME DAY D/C/MEDTRONIC    11 Days Post-Op  -------------------        Time of Death:  Patient placed on comfort measures earlier this afternoon.    Called by RN that the patient was in asystole.    Immediately went to evaluate the patient.    No response to stimuli.  Blown, non-reactive pupils.  Absent heart sounds  Absent breath sounds.    Patient was pronounced dead at 4:29 PM. Family was at bedside. They declined autopsy.    Time of death is:21-Apr-2020 at 4:29 PM.    Family has been notified at the time of this note.    Likely Cause of Death:                                                                      Acute on chronic respiratory failure with hypoxia, RV failure    Was Death Expected:                                                                      Death was expected.    Code Status: NO CPR  -  ALLOW NATURAL DEATH    Autopsy:   No Autopsy    Treatment Team:                                                                              Treatment Team: Attending Provider: Loistine Simas, MD; Respiratory Care Practitioner: Charlesetta Ivory, RT; Respiratory Care Practitioner: Ocie Doyne, RT; Registered Nurse: Tressia Danas, RN; Case Manager: Maryelizabeth Kaufmann, RN    Signed by: Loistine Simas, MD    VW:[id number], Despina Arias, MD

## 2020-04-07 NOTE — Progress Notes (Addendum)
     Date Time: 2020-04-05  6:36 PM  Patient Name: [facility]  Attending Physician: Loistine Simas, MD    Patient placed on morphine drip this shift for comfort. RN responds to alarming machine and finds patient actively passing away. Family in room. Calls attending who responds immediately.  Death checklist completed and all necessary numbers called. Patient given all necessary information and questions are answered. To perform post mortem care once family is done at bedside.

## 2020-04-07 NOTE — Progress Notes (Signed)
Palliative Medicine & Comprehensive   Clinical Therapist - Attempted Visit    Palliative Clinical Therapist visit attempted.     Several family members at bedside.     Therapist will make another attempt tomorrow.    Raynald Kemp, LCSW, ACHP-SW, APHSW-C  Clinical Therapist   Palliative Medicine & Comprehensive Care  To reach Palliative MD or NP call [phone] Or Page [phone]   To reach Clinical Therapist call [phone]

## 2020-04-07 NOTE — Progress Notes (Addendum)
[facility] Palliative Medicine & Comprehensive Care   Service Phone Number: FX: [phone], Mon-Fri 9a-4p / Xtend Pager: #[phone] (24/7)     Palliative Care Progress Note   Date Time: 08-Apr-2020 1:36 PM   Patient Name: [name], [name] [facility]   Location: A213/Y865.78   Attending Physician: Loistine Simas, MD   Primary Care Physician: Georgeanna Lea, MD   Consulting Provider: Namon Cirri, MD   Consulting Service: Palliative Medicine and Comprehensive Care  Consulted request from Loistine Simas, MD to see patient regarding:   Reason for Referral: Clarify goals of care; Symptom (dyspnea, nausea, anxiety, fatigue, etc.)          Assessment & Plan   Impression   [name] 75 y.o. female with PAHon home O2, PAF s/p ablation on eliquis, dCHF and hypertrophic cardiomyopathy and question of ILD as well asreports if auto-immune lung condition who presented for an elective single-to-dual champer ICD upgrade whose course was c/b hypotension/hypoxia, now extubated POD1 but remains on HFNC. Palliative medicine consulted for goals of care          Estimated Prognosis: Weeks to months         Recommendations   1. Goals of Care:   - "I want to go home and live"  - "I am realistic and know that I am very sick and have limited options"  - requests open/honest updates as to how she is doing and how realistic her goal of getting home is  - significant respiratory decline over the weekend  - transitioned to comfort care 12/20  - no CPR - AND    - she would like to donate her body to a medical school for research.  --- have made contact with the uniformed services   --- [employer] [phone] 7am-4pm   - Contact information provided to patient's Niece / mPOA      Barriers to her primary goal of care to get home  - needs to wean down oxygen requirement - remains on 90% FiO2      Code Status:       - NO CPR  -  ALLOW NATURAL DEATH      Advance Care Planning:  ACP Validation: Valid advanced  care planning documents completed during admission and Valid durable DNR/POST document completed during admission  Medical Decision Maker: Advance directives: Health Care Agent: [name] [phone] (Relationship: friend)  ACP Document: HCPOA and DDNR/POST     2. Psychosocial: pallitive medicine LCSW following    3. Spiritual:   - Religion: Jehovah's Witness.   - Chaplain available for support as needed    4. PC Team follow-up plans: tomorrow      Discharge Disposition: Home      Outpatient Follow Up Recommended: Yes    Outcomes: Family meetings, Clarified goals of care, Provided advance care planning, Completed durable DNR, ACP counseling assistance and POST completion  25 minutes.  Total time on unit today in care of [facility]   including chart review, face to face evaluation and management. >50% of time spent in counseling & coordination of care with Patient, Family, Consultants and referring provider with recommendations above.       Start Time:  1500                 Stop Time:   1525        Namon Cirri, MD, MD  Palliative Medicine & Comprehensive  Care  Phone Number: [phone], Mon-Fri 9a-4p   Xtend Pager: #[phone] (24/7)     Interval History   [name] [name] is a 75 y.o. female admitted to hospital on 03/14/2020 with Encounter for pacemaker at end of battery life [Z45.010]  Pacemaker generator end of life [Z45.010]     Patient seen at bedside with family at bedside. Joined primary team for family meeting.    Decision made for comfort measures only at this point, comfort medications to be started. Transitioned to no CPR-AND.    Discussed with patient niece/mPOA patients wish for whole body donation. Contact number for [employer] was provided. Confirmed last week that they are accepting donations.         Goals of Care   CURRENT CPR Status: NO CPR  -  ALLOW NATURAL DEATH          Advanced Care Planning:      Decisional Capacity: yes      Advance Directives have  been completed in the past: no      Advance Directives are available in chart: yes      Discussed this admission: yes      ACP note completed: yes       Date: 03/19/20      GOC Discussion: Initial goal to recover and get home for life prolongation. Unfortunately decline has been significant and that no longer appears to be possible. Transitioned to comfort care      Outcome of Discussion: comfort measures        Palliative Functional and Symptom Assessment     Palliative Performance Scale: 40% - Mainly in bed, unable to do any work, extensive disease, mainly assistance with self-care, normal or reduced intake, full LOC, drowsy or confusion    [place] Symptom Assessment Scale (ESAS): Completed  Anxiety: 0  Depression: 0  Drowsiness: 0  Lack of Appetite: 0  Nausea: 0  Pain: 0  Shortness of Breath: 10  Tiredness: 4  Well-being: 8 (0 best)     Medications   Scheduled Meds  Current Facility-Administered Medications   Medication Dose Route Frequency    morphine  2 mg Intravenous Once      DRIPS   morphine        PRN MEDS  Current Facility-Administered Medications   Medication Dose    dextrose  15 g of glucose    And    dextrose  12.5 g    And    glucagon (rDNA)  1 mg    LORazepam  1 mg       Allergies   Allergies   Allergen Reactions    Amiodarone Other (See Comments)     Lung toxicity    Cellcept [Mycophenolate]      Gastro hemorrhages     Biopatch Protective Disk-Chg [Chlorhexidine] Itching    Hibiclens [Chlorhexidine Gluconate] Itching     "Severe itiching"    Zyban [Bupropion] Itching       Physical Exam   BP 116/67    Pulse 69    Temp 97.2 F (36.2 C) (Axillary)    Resp (!) 30    Ht 1.613 m (5' 3.5")    Wt 82.5 kg (181 lb 14.1 oz)    SpO2 (!) 87%    BMI 31.71 kg/m    Physical Exam:  General: acutely ill appearing, moderate distress  HEENT:  EOMI, anicteric, BiPAP in places, MMM  Neck: supple   CV: aflutter, tachycardic  Lungs:  tachypneic, hypoxic on bipap, max settings  Abd: soft, ND, NABS  Ext: Trace  pedal edema  Neuro: awake, alert, oriented x 3, no focal deficits  Psych:  appropriate insight and judgement, mood and affect congruent to current situation  Skin: no rashes or lesions noted      Labs / Radiology   Lab and diagnostics: reviewed in Epic  Recent Labs   Lab 03/25/20  0559   WBC 22.31*   Hgb 11.9   Hematocrit 37.6   Platelets 224       Recent Labs   Lab 03/25/20  1238   PTT 42*        Recent Labs   Lab 03/25/20  1820   Sodium 150*   Potassium 3.9   Chloride 100   CO2 28   BUN 75.0*   Creatinine 2.1*   EGFR 27.8   Glucose 155*   Calcium 9.2     Recent Labs   Lab 03/25/20  1320   Bilirubin, Total 0.5   Bilirubin Direct 0.3   Protein, Total 7.2   Albumin 2.7*   ALT 26   AST (SGOT) 19          CT Chest WO Contrast    Result Date: 03/21/2020  1. New smooth interstitial thickening, groundglass and trace effusions favoring congestion. 4 mm subpleural right lower lobe nodule needs no additional follow-up in the absence of known malignancy or significant risk factors. 2. Stable mediastinal adenopathy. Bosie Helper, MD  03/21/2020 11:42 AM    XR Chest AP Portable    Result Date: 03/28/2020  No significant interval change. Charlott Rakes, MD  03/19/2020 8:37 AM    XR Chest AP Portable    Result Date: 03/25/2020   Persistent cardiomegaly with bilateral infiltrates versus edema. Progressive left greater than right basilar collapse/consolidation. Prince Solian, MD  03/25/2020 9:41 AM    XR Chest AP Portable    Result Date: 03/24/2020    Findings consistent with mild CHF and left basilar atelectasis. Geanie Cooley, MD  03/24/2020 7:41 PM    XR Chest AP Portable    Result Date: 03/24/2020  1. No significant change diffuse opacities or edema and mild left basilar atelectasis. Clide Cliff, MD  03/24/2020 8:37 AM    XR Chest AP Portable    Result Date: 03/23/2020    CHF Gerlene Burdock, MD  03/23/2020 6:27 PM    XR Chest AP Portable    Result Date: 03/21/2020   Persistent, potentially increasing moderate cardiomegaly and/or  pericardial effusion. Unchanged moderate diffuse bilateral interstitial lung opacities. Remainder as above. Wilmon Pali, MD  03/21/2020 7:19 AM    XR Chest AP Portable    Result Date: 03/20/2020   Slightly decreasing vascular congestion and interstitial edema. Remainder as above. Wilmon Pali, MD  03/20/2020 7:47 AM

## 2020-04-07 NOTE — Plan of Care (Signed)
I met with the patient and her family at bedside. They were updated regarding her clinical condition. Patient requested that we initiate comfort directed therapies at this time.     Will discontinue all medications and start morphine drip and Ativan PRN pushes.    When she is more comfortable we will remove the BiPAP mask and place her on nasal canula.    Her code status has been changed to Allow Natural Death.     Emerson Monte. Williams Che, M.D.  [facility] Attending

## 2020-04-07 NOTE — PT Plan of Care Note (Signed)
Physical Therapy Cancellation Note      Patient:  [name] MRN#:  [id number]  Unit:  [facility] TOWER 4 Room/Bed:  V409/W119.14    04-25-2020  Time: 9:22 AM    Pt not seen for physical therapy secondary to pt likely transitioning to comfort care. Will complete current PT orders. Thank you.      Guerry Minors, PT,  PT, DPT  9:22 AM 25-Apr-2020  Pager [phone]

## 2020-04-07 NOTE — Progress Notes (Signed)
Advanced Lung Disease and Lung Transplant Inpatient Consult Note    Dr. Larinda Buttery (attending physician) and I reviewed the patient's chart, lab, radiology, etc. When we went to the patient's room, numerous family members present and were having goals of care discussion. Per ICU team, change of goals of care with to transition to comfort care. We will sign-off at this time.    Rosalyn Gess, M.D.  [place] [facility] Fellow  [address]  [place], [place] [address]  Spectra: [phone]  Pager: [phone]  Fax: [phone]  E-mail: [email or web address]

## 2020-04-07 DEATH — deceased

## 2020-04-30 ENCOUNTER — Ambulatory Visit: Payer: Self-pay

## 2020-04-30 ENCOUNTER — Ambulatory Visit: Payer: Self-pay | Admitting: Cardiovascular Disease

## 2020-05-07 ENCOUNTER — Ambulatory Visit (INDEPENDENT_AMBULATORY_CARE_PROVIDER_SITE_OTHER): Payer: Medicare Other | Admitting: Rheumatology

## 2020-05-16 ENCOUNTER — Ambulatory Visit (INDEPENDENT_AMBULATORY_CARE_PROVIDER_SITE_OTHER): Payer: Medicare Other | Admitting: Specialist
